# Patient Record
Sex: Male | Born: 1975 | Race: White | Hispanic: No | Marital: Married | Smoking: Never smoker
Health system: Southern US, Community
[De-identification: ages and names within clinical notes are randomized; demographics above are authoritative.]

## PROBLEM LIST (undated history)

## (undated) ENCOUNTER — Emergency Department (HOSPITAL_COMMUNITY): Discharge status: Absent without leave | Payer: 59

## (undated) DIAGNOSIS — C73 Malignant neoplasm of thyroid gland: Secondary | ICD-10-CM

## (undated) DIAGNOSIS — N200 Calculus of kidney: Secondary | ICD-10-CM

## (undated) DIAGNOSIS — Z923 Personal history of irradiation: Secondary | ICD-10-CM

## (undated) DIAGNOSIS — I10 Essential (primary) hypertension: Secondary | ICD-10-CM

## (undated) DIAGNOSIS — A799 Rickettsiosis, unspecified: Secondary | ICD-10-CM

## (undated) HISTORY — DX: Personal history of irradiation: Z92.3

## (undated) HISTORY — PX: CHOLECYSTECTOMY: SHX55

## (undated) HISTORY — DX: Malignant neoplasm of thyroid gland: C73

---
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
## 2001-01-07 ENCOUNTER — Emergency Department (HOSPITAL_COMMUNITY): Admission: EM | Admit: 2001-01-07 | Discharge: 2001-01-07 | Payer: Self-pay | Admitting: Emergency Medicine

## 2003-01-24 ENCOUNTER — Emergency Department (HOSPITAL_COMMUNITY): Admission: AD | Admit: 2003-01-24 | Discharge: 2003-01-24 | Payer: Self-pay

## 2004-01-09 ENCOUNTER — Emergency Department (HOSPITAL_COMMUNITY): Admission: EM | Admit: 2004-01-09 | Discharge: 2004-01-09 | Payer: Self-pay | Admitting: Family Medicine

## 2005-10-16 ENCOUNTER — Ambulatory Visit: Payer: Self-pay | Admitting: Sports Medicine

## 2005-10-16 ENCOUNTER — Encounter: Admission: RE | Admit: 2005-10-16 | Discharge: 2005-10-16 | Payer: Self-pay | Admitting: Sports Medicine

## 2006-06-07 ENCOUNTER — Emergency Department (HOSPITAL_COMMUNITY): Admission: EM | Admit: 2006-06-07 | Discharge: 2006-06-07 | Payer: Self-pay | Admitting: Emergency Medicine

## 2007-06-12 ENCOUNTER — Emergency Department (HOSPITAL_COMMUNITY): Admission: EM | Admit: 2007-06-12 | Discharge: 2007-06-12 | Payer: Self-pay | Admitting: Family Medicine

## 2007-09-14 ENCOUNTER — Emergency Department (HOSPITAL_COMMUNITY): Admission: EM | Admit: 2007-09-14 | Discharge: 2007-09-14 | Payer: Self-pay | Admitting: Emergency Medicine

## 2008-01-17 ENCOUNTER — Emergency Department (HOSPITAL_COMMUNITY): Admission: EM | Admit: 2008-01-17 | Discharge: 2008-01-17 | Payer: Self-pay | Admitting: Family Medicine

## 2008-09-05 ENCOUNTER — Emergency Department (HOSPITAL_COMMUNITY): Admission: EM | Admit: 2008-09-05 | Discharge: 2008-09-05 | Payer: Self-pay | Admitting: Family Medicine

## 2008-10-07 ENCOUNTER — Emergency Department (HOSPITAL_COMMUNITY): Admission: EM | Admit: 2008-10-07 | Discharge: 2008-10-07 | Payer: Self-pay | Admitting: Family Medicine

## 2008-10-08 ENCOUNTER — Observation Stay (HOSPITAL_COMMUNITY): Admission: EM | Admit: 2008-10-08 | Discharge: 2008-10-11 | Payer: Self-pay | Admitting: Emergency Medicine

## 2008-10-10 ENCOUNTER — Encounter (INDEPENDENT_AMBULATORY_CARE_PROVIDER_SITE_OTHER): Payer: Self-pay | Admitting: General Surgery

## 2009-06-03 DIAGNOSIS — J309 Allergic rhinitis, unspecified: Secondary | ICD-10-CM | POA: Insufficient documentation

## 2010-05-24 LAB — CBC
HCT: 41.5 % (ref 39.0–52.0)
HCT: 48.8 % (ref 39.0–52.0)
Hemoglobin: 14.6 g/dL (ref 13.0–17.0)
Hemoglobin: 16.8 g/dL (ref 13.0–17.0)
MCHC: 34.4 g/dL (ref 30.0–36.0)
MCHC: 35.1 g/dL (ref 30.0–36.0)
MCV: 89.3 fL (ref 78.0–100.0)
MCV: 90.2 fL (ref 78.0–100.0)
Platelets: 163 10*3/uL (ref 150–400)
Platelets: 178 10*3/uL (ref 150–400)
Platelets: 183 10*3/uL (ref 150–400)
RBC: 4.35 MIL/uL (ref 4.22–5.81)
RBC: 4.64 MIL/uL (ref 4.22–5.81)
RBC: 5.41 MIL/uL (ref 4.22–5.81)
RDW: 12.9 % (ref 11.5–15.5)
RDW: 13.5 % (ref 11.5–15.5)
WBC: 5.3 10*3/uL (ref 4.0–10.5)
WBC: 5.3 10*3/uL (ref 4.0–10.5)
WBC: 7.3 10*3/uL (ref 4.0–10.5)

## 2010-05-24 LAB — COMPREHENSIVE METABOLIC PANEL
ALT: 20 U/L (ref 0–53)
ALT: 23 U/L (ref 0–53)
AST: 18 U/L (ref 0–37)
AST: 24 U/L (ref 0–37)
Albumin: 3.6 g/dL (ref 3.5–5.2)
Albumin: 4.5 g/dL (ref 3.5–5.2)
Alkaline Phosphatase: 81 U/L (ref 39–117)
Alkaline Phosphatase: 92 U/L (ref 39–117)
BUN: 7 mg/dL (ref 6–23)
BUN: 9 mg/dL (ref 6–23)
CO2: 25 mEq/L (ref 19–32)
CO2: 26 mEq/L (ref 19–32)
Calcium: 8.2 mg/dL — ABNORMAL LOW (ref 8.4–10.5)
Calcium: 9.2 mg/dL (ref 8.4–10.5)
Chloride: 103 mEq/L (ref 96–112)
Chloride: 104 mEq/L (ref 96–112)
Creatinine, Ser: 0.75 mg/dL (ref 0.4–1.5)
Creatinine, Ser: 0.79 mg/dL (ref 0.4–1.5)
GFR calc Af Amer: >60 mL/min (ref >60)
GFR calc Af Amer: >60 mL/min (ref >60)
GFR calc non Af Amer: >60 mL/min (ref >60)
GFR calc non Af Amer: >60 mL/min (ref >60)
Glucose, Bld: 94 mg/dL (ref 70–99)
Glucose, Bld: 97 mg/dL (ref 70–99)
Potassium: 3.5 mEq/L (ref 3.5–5.1)
Potassium: 3.8 mEq/L (ref 3.5–5.1)
Sodium: 136 mEq/L (ref 135–145)
Sodium: 136 mEq/L (ref 135–145)
Total Bilirubin: 1.4 mg/dL — ABNORMAL HIGH (ref 0.3–1.2)
Total Bilirubin: 1.4 mg/dL — ABNORMAL HIGH (ref 0.3–1.2)
Total Protein: 6.1 g/dL (ref 6.0–8.3)
Total Protein: 7.8 g/dL (ref 6.0–8.3)

## 2010-05-24 LAB — DIFFERENTIAL
Basophils Absolute: 0 10*3/uL (ref 0.0–0.1)
Basophils Absolute: 0 10*3/uL (ref 0.0–0.1)
Basophils Relative: 0 % (ref 0–1)
Basophils Relative: 0 % (ref 0–1)
Eosinophils Absolute: 0 10*3/uL (ref 0.0–0.7)
Eosinophils Absolute: 0 10*3/uL (ref 0.0–0.7)
Eosinophils Relative: 0 % (ref 0–5)
Eosinophils Relative: 0 % (ref 0–5)
Lymphocytes Relative: 13 % (ref 12–46)
Lymphocytes Relative: 7 % — ABNORMAL LOW (ref 12–46)
Lymphs Abs: 0.5 10*3/uL — ABNORMAL LOW (ref 0.7–4.0)
Lymphs Abs: 0.7 10*3/uL (ref 0.7–4.0)
Monocytes Absolute: 0.4 10*3/uL (ref 0.1–1.0)
Monocytes Absolute: 0.5 10*3/uL (ref 0.1–1.0)
Monocytes Relative: 7 % (ref 3–12)
Monocytes Relative: 8 % (ref 3–12)
Neutro Abs: 4.2 10*3/uL (ref 1.7–7.7)
Neutro Abs: 6.3 10*3/uL (ref 1.7–7.7)
Neutrophils Relative %: 79 % — ABNORMAL HIGH (ref 43–77)
Neutrophils Relative %: 86 % — ABNORMAL HIGH (ref 43–77)

## 2010-05-24 LAB — LIPASE, BLOOD: Lipase: 25 U/L (ref 11–59)

## 2010-05-24 LAB — T4, FREE: Free T4: 0.94 ng/dL (ref 0.80–1.80)

## 2010-05-24 LAB — BASIC METABOLIC PANEL
BUN: 3 mg/dL — ABNORMAL LOW (ref 6–23)
Calcium: 8.5 mg/dL (ref 8.4–10.5)
Creatinine, Ser: 0.87 mg/dL (ref 0.4–1.5)
GFR calc Af Amer: >60 mL/min (ref >60)
GFR calc non Af Amer: >60 mL/min (ref >60)

## 2010-05-24 LAB — URINALYSIS, ROUTINE W REFLEX MICROSCOPIC
Bilirubin Urine: NEGATIVE
Glucose, UA: NEGATIVE mg/dL
Hgb urine dipstick: NEGATIVE
Ketones, ur: NEGATIVE mg/dL
Nitrite: NEGATIVE
Protein, ur: NEGATIVE mg/dL
Specific Gravity, Urine: 1.024 (ref 1.005–1.030)
Urobilinogen, UA: 1 mg/dL (ref 0.0–1.0)
pH: 6.5 (ref 5.0–8.0)

## 2010-05-24 LAB — AMYLASE
Amylase: 109 U/L (ref 27–131)
Amylase: 94 U/L (ref 27–131)

## 2010-05-24 LAB — URINE CULTURE: Colony Count: 25000

## 2010-05-24 LAB — LIPID PANEL
Cholesterol: 119 mg/dL (ref 0–200)
HDL: 20 mg/dL — ABNORMAL LOW (ref >39)
LDL Cholesterol: 82 mg/dL (ref 0–99)
Total CHOL/HDL Ratio: 6 RATIO
Triglycerides: 84 mg/dL (ref <150)
VLDL: 17 mg/dL (ref 0–40)

## 2010-05-24 LAB — TSH: TSH: 1.193 u[IU]/mL (ref 0.350–4.500)

## 2010-05-24 LAB — BILIRUBIN, DIRECT: Bilirubin, Direct: 0.2 mg/dL (ref 0.0–0.3)

## 2010-05-24 LAB — LACTIC ACID, PLASMA: Lactic Acid, Venous: 1.3 mmol/L (ref 0.5–2.2)

## 2010-05-24 LAB — SEDIMENTATION RATE: Sed Rate: 2 mm/hr (ref 0–16)

## 2010-07-01 NOTE — H&P (Signed)
NAME:  Brian [name], Brian [name] NO.:  [id number]   MEDICAL RECORD NO.:  [id number]          PATIENT TYPE:  EMS   LOCATION:  MAJO                         FACILITY:  [facility]   PHYSICIAN:  Vania Rea, M.D. DATE OF BIRTH:  07/15/1975   DATE OF ADMISSION:  10/07/2008  DATE OF DISCHARGE:                              HISTORY & PHYSICAL   PRIMARY CARE PHYSICIAN:  Dr. Koren Bound in [place], [place].   CHIEF COMPLAINT:  Abdominal pain since the morning of August 22.   HISTORY OF PRESENT ILLNESS:  This is a 35 year old obese Caucasian  gentleman who works as an Museum/gallery exhibitions officer at the [employer] and has been  in good health until he was awoken at about 4:30 a.m. this morning with  sharp, cramping right sided abdominal pain which he described as a 10  out of 10.  The pain did not radiate and seemed to maintain a constant  high but would wax and wane intermittently at about 15 minutes  intervals.  But was never below and 8/10 until eventually presented to  [facility] yesterday and was treated with intravenous  narcotics.  The patient gives a past history of right-sided abdominal  discomfort with fatty meals and was presumed to have had cholecystitis,  but subsequent laboratory investigations, ultrasounds and CAT scans have  been negative.  The patient continues to complain of severe abdominal  pain.  The pain is associated with nausea and vomiting if he tries to  drink anything, but no diarrhea.  No dysuria.  He did have a temperature  of 100.7 by report at the [facility] but is currently  afebrile.  He has no history of peptic ulcer disease.  He has had no  melena nor bloody stool.   PAST MEDICAL HISTORY:  Childhood asthma, past history of bronchitis,  seasonal allergies.   MEDICATIONS:  None.   ALLERGIES:  NO KNOWN DRUG ALLERGIES.   SOCIAL HISTORY:  Denies any history of alcohol, tobacco or illicit drug  use.  Works as an Museum/gallery exhibitions officer as noted above.   Lives with his wife and his 59-  year-old and 70-month-old children.   FAMILY HISTORY:  Significant for mother with diabetes type 1,  hypertension and history of myocardial infarction.   SURGICAL HISTORY:  Has never had surgery.   REVIEW OF SYSTEMS:  Other than noted above, 10-point review of systems  was unremarkable.   PHYSICAL EXAM:  GENERAL:  Obese young Caucasian gentleman lying in the  stretcher drowsy from narcotic pain medications, yet still seems to be  in some discomfort.  VITALS:  His temperature is 98.9, pulse 89, respirations 20, blood  pressure 135/91.  He is saturating 100% on 3 liters.  HEENT:  Pupils are round equal and reactive.  Mucous membranes pink.  Anicteric.  He is mildly dehydrated.  No cervical lymphadenopathy or  thyromegaly.  No jugular venous distention.  No carotid bruit.  No oral  Candida or pharyngeal injection.  CHEST:  Clear to auscultation bilaterally.  CARDIOVASCULAR:  Regular rhythm without murmurs.  ABDOMEN:  Obese and soft.  There are no masses.  There is no epigastric  tenderness but he does have some tenderness in the right upper quadrant  but no masses felt and the area is soft to palpation.  There is no  rebound.  He has normal abdominal bowel sounds.  EXTREMITIES:  Without edema, has 3+ bounding pulses bilaterally.  His  skin is warm and dry.  There is no ulcerations.  MUSCULOSKELETAL:  He has no bony joint deformity and he has good muscle  tone and bulk.   LABORATORY DATA:  His white count is 5.3, hemoglobin 14.6, platelets  163.  He has 79% neutrophils.  Absolute granulocyte count is 4.2.  Differential is otherwise unremarkable.  Sodium is 136, potassium 3.5,  chloride 104, CO2 26, BUN 7, creatinine 0.8, glucose is 97.  Liver  function tests significant only for total bilirubin of 1.4, alk phos,  AST and ALT are all normal.  Total protein is borderline low at 6.1,  albumin is 3.16.  His calcium is 8.2.  Amylase is normal at 94.  His   lipase is normal 25.  Urinalysis is completely normal and his venous  lactic acid is also completely normal at 1.3.   IMAGING STUDIES:  Ultrasound of the abdomen reveals normal caliber  common bile duct at 2.4 mm.  Liver is normal echogenicity.  The IVC is  normal.  The right kidney is normal without focal lesions or  hydronephrosis.  The gallbladder is sonographically normal.  Unremarkable ultrasound.  CT scan of the abdomen and pelvis again showed  mild soft tissue stranding in the mesenteric fat which is nonspecific  but may reflect mesenteric edema, inflammation or fibrosis, no  associated adenopathy or bowel abnormality.  Normal gallbladder.  No  acute pelvic findings in the pelvis.  No fluid collection.   ASSESSMENT:  Acute right upper abdominal pain in obese gentleman with a  history of fatty intolerance but with negative imaging studies to  suggest gallbladder disease and mildly elevated total bilirubin.   PLAN:  1. Will get a differential on his bilirubin, will admit him for IV      fluid hydration and pain management and get a HIDA scan in the      morning.  If HIDA scan is positive we will refer to surgery for      possible cholecystectomy.  2. The patient does have evidence of possible mesenteric edema but it      is unclear the significance.  Other plans as per orders.      Vania Rea, M.D.  Electronically Signed     LC/MEDQ  D:  10/08/2008  T:  10/08/2008  Job:  [id number]   cc:   Dr. Koren Bound, [place]

## 2010-07-01 NOTE — Consult Note (Signed)
NAME:  Brian [name], Brian [name] NO.:  [id number]   MEDICAL RECORD NO.:  [id number]          PATIENT TYPE:  OBV   LOCATION:  5156                         FACILITY:  [facility]   PHYSICIAN:  Gabrielle Dare. Janee Morn, M.D.DATE OF BIRTH:  01-17-1976   DATE OF CONSULTATION:  10/09/2008  DATE OF DISCHARGE:                                 CONSULTATION   PRIMARY CARE PHYSICIAN:  Koren Bound, MD, in [place], [place].   REQUESTING PHYSICIAN:  Michelene Gardener, MD   CONSULTING SURGEON:  Gabrielle Dare. Janee Morn, MD   REASON FOR CONSULTATION:  Biliary dyskinesia.   HISTORY OF PRESENT ILLNESS:  Brian [name] is a 35 year old white male with  no significant past medical history who awoke from sleep at 4:30 a.m. on  Sunday morning with a sharp stabby right upper quadrant abdominal pain.  Later that day, he went to the urgent care where subsequently had an  ultrasound of the abdomen that was performed, which showed a completely  normal gallbladder and no gallstones present.  He then had a CT scan of  the abdomen and pelvis completed, which once again revealed a normal-  appearing gallbladder, which may be a slight mild stranding around the  mesentery, but otherwise normal.  He was then sent to the emergency  department where he was admitted for observation.  He subsequently then  developed some nausea, however, this was controlled with Compazine.  A  HIDA scan was then performed, which showed a patent cystic and common  bile duct, however, he had biliary dyskinesia with an ejection fraction  of 9.4%.  Currently, he is still having mild right upper quadrant pain  that did tolerate a regular bland diet this morning.  Due to the  findings of biliary dyskinesia, we were asked to evaluate the patient.   REVIEW OF SYSTEMS:  Please see HPI.  Otherwise, all other systems are  negative.   PAST MEDICAL HISTORY:  Seasonal allergies.   PAST SURGICAL HISTORY:  None.   SOCIAL HISTORY:  The patient is married and  has 2 children.  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   ALLERGIES:  NKDA.   MEDICATIONS:  None.   PHYSICAL EXAMINATION:  GENERAL:  The patient is 35 year old white male,  who is currently walking around and sitting up, in no acute distress.  VITAL SIGNS:  Temperature 97.7, pulse 54, respirations 18, and blood  pressure 98/55.  HEENT:  Head is normocephalic and atraumatic.  Sclerae noninjected.  Pupils are equal, round, and reactive to light.  Ears and nose without  any obvious masses or lesions.  No rhinorrhea.  Mouth is pink and moist.  Throat shows no exudate.  HEART:  Regular rate and rhythm.  Normal S1 and S2.  No murmurs,  gallops, or rubs were noted.  LUNGS:  Clear to auscultation bilaterally with no wheezes, rhonchi, or  rales noted.  Respiratory effort is nonlabored.  ABDOMEN:  Soft with very mild right upper quadrant tenderness.  Otherwise, he does  have active bowel sounds and is nondistended.  He  does not have any masses, hernias, or scars noted.  MUSCULOSKELETAL:  All 4 extremities are symmetrical.  No cyanosis,  clubbing, or edema.  PSYCH:  The patient is alert and oriented x3 with an appropriate affect.   LABORATORY DATA AND DIAGNOSTIC DATA:  White blood cell count 5300,  hemoglobin 14.6, hematocrit 41.5, and platelet count is 163,000.  Sodium  136, potassium 3.5, glucose 97, BUN 7, and creatinine 0.79.  AST 18, ALT  20, alkaline phosphatase 81, total bilirubin 1.4, however, direct  bilirubin is only 0.2.  Diagnostics, ultrasound of the abdomen reveals a  normal gallbladder with no cholelithiasis and normal caliber of common  bile duct.  CT scan of the abdomen and pelvis revealed mild soft tissue  stranding in the mesenteric fat, which is nonspecific.  Otherwise, the  gallbladder appears normal and the rest of the scan is normal.  HIDA  scan, the patient has a patent cystic to common bile duct with an   ejection fraction of 9.4%.   IMPRESSION:  Biliary dyskinesia.   PLAN:  Biliary dyskinesia is a non-urgent/emergent problem.  The  patient's white blood cell count, LFTs, and diagnostic studies are  essentially normal except for currently the patient is still having some  mild right upper quadrant abdominal pain.  Most patients consider to go  home and come back to our office and have an elective laparoscopic  cholecystectomy with no problems.  However, due to the patient still  having some mild pain and not wanting to go home  and have a repeat  episode, went home along with his children and living an hour away, the  patient would rather stay and have his gallbladder out this admission.  I have explained to the patient that we have several urgent/emergent  cases that are already scheduled for the next several days and it may be  2-3 days before we are able to get to his laparoscopic cholecystectomy  due to his nonurgent state.  Despite this, he still is leaning more  towards same due to his concern of going home and having a repeat  episode.  Because of this problem, I will have Dr. Janee Morn talk with  the patient to discuss a further plan.      Letha Cape, PA      Gabrielle Dare. Janee Morn, M.D.  Electronically Signed    KEO/MEDQ  D:  10/09/2008  T:  10/10/2008  Job:  [id number]   cc:   Michelene Gardener, MD  Koren Bound, MD

## 2010-07-01 NOTE — Discharge Summary (Signed)
NAME:  Brian [name], Brian [name] NO.:  [id number]   MEDICAL RECORD NO.:  [id number]          PATIENT TYPE:  OBV   LOCATION:  5156                         FACILITY:  [facility]   PHYSICIAN:  Michelene Gardener, MD    DATE OF BIRTH:  [birth year]   DATE OF ADMISSION:  10/07/2008  DATE OF DISCHARGE:  10/09/2008                               DISCHARGE SUMMARY   DISCHARGE DIAGNOSES:  1. Dysfunctional gallbladder  2. Abdominal pain.  3. Nausea and vomiting.  4. Obesity.   DISCHARGE MEDICATION:  Phenergan 25 mg p.o. q.4 h. as needed.   CONSULTATIONS:  None.   PROCEDURES:  None.   DIAGNOSTIC STUDIES:  1. Abdominal ultrasound on October 07, 2008 showed no acute findings.  2. CT scan of the abdomen without contrast in October 07, 2008 showed      nonspecific findings, gallbladder appeared normal.  3. CT scan of the pelvis showed no acute problem.  4. HIDA scan with ejection fraction showed no evidence of common bowel      obstruction and markedly diminished ejection fraction at 9.4.   COURSE OF HOSPITALIZATION:  This is a 35 year old Caucasian male  presented to the hospital with increasing nausea, vomiting, and  abdominal pain.  He has abdominal ultrasound that came to be normal.  CT  scan of the abdomen and pelvis were normal.  HIDA scan was done and it  showed low ejection fraction, but no evidence of gallstone.  This  patient possibly will need his gallbladder to come out and that could be  done electively.  Condition was discussed with him and he preferred to  follow this as an outpatient.  Currently, he is asymptomatic and he is  stable and it is felt that it is fine to get him home.   Addendum: the condition was re-discussed with the patient who changed  his mind and preferred to stay until evaluated by general surgery for  possible choleycystectomy. Addendum will be added by the discharging  physician.   Total assessment time is 40 minutes.      Michelene Gardener, MD  Electronically Signed    NAE/MEDQ  D:  10/09/2008  T:  10/10/2008  Job:  [id number]

## 2010-07-01 NOTE — Op Note (Signed)
NAME:  Brian [name], Brian [name] NO.:  [id number]   MEDICAL RECORD NO.:  [id number]          PATIENT TYPE:  OBV   LOCATION:  5156                         FACILITY:  [facility]   PHYSICIAN:  Gabrielle Dare. Janee Morn, M.D.DATE OF BIRTH:  07-21-1975   DATE OF PROCEDURE:  10/10/2008  DATE OF DISCHARGE:                               OPERATIVE REPORT   PREOPERATIVE DIAGNOSIS:  Biliary dyskinesia.   POSTOPERATIVE DIAGNOSIS:  Biliary dyskinesia.   PROCEDURE:  Laparoscopic cholecystectomy.   SURGEON:  Gabrielle Dare. Janee Morn, MD   ASSISTANT:  Brayton El, PA-C   ANESTHESIA:  General endotracheal.   HISTORY OF PRESENT ILLNESS:  [name] is a 35 year old white male who  has had episodic right upper quadrant pain.  Workup included HIDA scan  demonstrating gallbladder ejection fraction of 9.4%, so he is brought  for cholecystectomy.   PROCEDURE IN DETAIL:  Informed consent was obtained.  The patient  received intravenous antibiotics.  He was identified in the preop  holding area.  He was brought to the operating room.  General  endotracheal anesthesia was administered by anesthesia staff.  His  abdomen was prepped and draped in sterile fashion.  We performed a time-  out procedure.  The infraumbilical region was infiltrated with 0.25%  Marcaine with epinephrine.  An infraumbilical incision was made.  Subcutaneous tissues were dissected down revealing the anterior fascia.  This was divided sharply and the peritoneal cavity was entered under  direct vision without difficulty.  A 0-Vicryl pursestring suture was  placed around the fascial opening.  A Hasson trocar was inserted into  the abdomen.  The abdomen was insufflated with carbon dioxide in  standard fashion.  Under direct vision, an 11-mm epigastric and two 5-mm  lateral ports were placed.  A 0.25% Marcaine with epinephrine was used  at all port sites.  The dome of the gallbladder was then retracted  superomedially.  The infundibulum was  retracted inferolaterally.  The  gallbladder appeared somewhat edematous.  Dissection began laterally and  progressed medially, easily identifying the cystic duct and the cystic  artery.  Dissection was continued until a large window was created  between the cystic duct, the infundibulum of the gallbladder and liver.  Once we had excellent visualization, a clip was placed on the  infundibulum-cystic duct junction.  A small nick was made in the cystic  duct and a Reddick cholangiogram catheter was inserted.  Intraoperative  cholangiogram was attempted, however, the Reddick catheter could not be  fully inserted into the cystic duct due to some bowel.  We did try and  freed up these bowels but we were unable to insert the Reddick catheter.  Due to the patient's normal liver function tests, cholangiogram was then  abandoned.  The cystic duct was clipped 3 times proximally and then  divided.  Clips were in excellent position.  The cystic artery was then  further dissected out.  It was clipped 3 times proximally, once  distally, and divided.  The gallbladder was then taken off the liver bed  with Bovie cautery.  We got  excellent hemostasis along the way.  The  gallbladder was placed in an EndoCatch bag and removed from the abdomen  via the infraumbilical port site.  The abdomen was copiously irrigated  with saline.  The irrigation fluid returned clear.  The liver bed was  rechecked.  The liver bed remained completely dry.  The clips remained  in excellent position.  Some residual irrigation of fluid was evacuated.  The ports were then removed under direct vision.  The pneumoperitoneum  was released.  The infraumbilical fascia was closed by tying the 0-  Vicryl pursestring suture.  All 4 wounds were copiously irrigated and  skin of each was closed with running 4-0 Vicryl subcuticular stitch  followed by Dermabond.  Sponge, needle, and instrument counts were all  correct.  The patient tolerated  the procedure well without apparent  complication and was taken to the recovery room in stable condition.      Gabrielle Dare Janee Morn, M.D.  Electronically Signed     BET/MEDQ  D:  10/10/2008  T:  10/11/2008  Job:  [id number]   cc:   Vania Rea, M.D.

## 2010-11-14 LAB — POCT URINALYSIS DIP (DEVICE)
Glucose, UA: NEGATIVE
Operator id: 208841
Specific Gravity, Urine: 1.025
Urobilinogen, UA: 1

## 2010-12-30 ENCOUNTER — Encounter: Payer: Self-pay | Admitting: *Deleted

## 2010-12-30 ENCOUNTER — Emergency Department
Admission: EM | Admit: 2010-12-30 | Discharge: 2010-12-30 | Disposition: A | Discharge status: Home / Self Care | Payer: 59 | Source: Home / Self Care | Attending: Emergency Medicine | Admitting: Emergency Medicine

## 2010-12-30 ENCOUNTER — Emergency Department
Admit: 2010-12-30 | Discharge: 2010-12-30 | Disposition: A | Discharge status: Home / Self Care | Payer: 59 | Attending: Emergency Medicine | Admitting: Emergency Medicine

## 2010-12-30 DIAGNOSIS — M79609 Pain in unspecified limb: Secondary | ICD-10-CM

## 2010-12-30 DIAGNOSIS — S92919A Unspecified fracture of unspecified toe(s), initial encounter for closed fracture: Secondary | ICD-10-CM

## 2010-12-30 DIAGNOSIS — M79676 Pain in unspecified toe(s): Secondary | ICD-10-CM

## 2010-12-30 NOTE — ED Notes (Signed)
Patient tripped @ home yesterday injuring his right 4th toe. He pulled the toe back in to place then buddy taped it to the 3rd toe. He has ecchymosis to the 4th toe and pain @ the metatarsal.

## 2010-12-30 NOTE — ED Provider Notes (Signed)
History     CSN: [id number] Arrival date & time: 12/30/2010  2:43 PM   First MD Initiated Contact with Patient 12/30/10 1443      Chief Complaint  Patient presents with  . 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  He felt that the 4th toe (maybe 5th toe) dislocated, so he relocated him himself using traction.  Today is feeling more pain in his 3rd-5th toes, esp 4th with associated bruising.  It is painful to walk on it so he's been walking on a supinated foot which helps.  History reviewed. No pertinent past medical history.  History reviewed. No pertinent past surgical history.  No family history on file.  History  Substance Use Topics  . Smoking status: Not on file  . Smokeless tobacco: Not on file  . Alcohol Use: Not on file      Review of Systems  Allergies  Review of patient's allergies indicates no known allergies.  Home Medications  No current outpatient prescriptions on file.  BP 124/88  Pulse 84  Temp(Src) 98.5 F (36.9 C) (Oral)  Resp 14  Ht 5\' 11"  (1.803 m)  Wt 247 lb (112.038 kg)  BMI 34.45 kg/m2  SpO2 100%  Physical Exam  Nursing note and vitals reviewed. Constitutional: He is oriented to person, place, and time. He appears well-developed and well-nourished.  HENT:  Head: Normocephalic and atraumatic.  Neck: Neck supple.  Cardiovascular: Regular rhythm and normal heart sounds.   Pulmonary/Chest: Effort normal and breath sounds normal. No respiratory distress.  Musculoskeletal:       R ankle/foot: FROM, +TTP 4th toe and distal 4th metatarsal with associated bruising and mild swelling.   No TTP medial/lateral malleolus, navicular, base of 5th, calcaneus, Achilles, proximal fibula.  Distal neurovascular status is intact.  There is no gross deformity or rotation of the toe.    Neurological: He is alert and oriented to person, place, and time.  Skin: Skin is warm and dry.  Psychiatric: He has a normal mood and affect. His speech is normal.    ED Course  Procedures (including critical care time)  Labs Reviewed - No data to display No results found.   No diagnosis found.    MDM   Xray obtained and read by radiology as "dorsal plate avulsion fracture of distal phalynx".  Encourage rest, ice, compression with ACE bandage / buddy taping / firm-soled shoe, and elevation of injured body part.   He is an EMT and understands the treatment.  If not improving, to follow up with Dr. Pearletha Forge at [facility] sports medicine.      Lily Kocher, MD 12/30/10 [phone]

## 2011-05-08 ENCOUNTER — Ambulatory Visit
Admission: RE | Admit: 2011-05-08 | Discharge: 2011-05-08 | Disposition: A | Discharge status: Home / Self Care | Payer: No Typology Code available for payment source | Source: Ambulatory Visit | Attending: Family Medicine | Admitting: Family Medicine

## 2011-05-08 ENCOUNTER — Other Ambulatory Visit: Payer: Self-pay | Admitting: Family Medicine

## 2011-05-08 DIAGNOSIS — R52 Pain, unspecified: Secondary | ICD-10-CM

## 2012-05-11 ENCOUNTER — Encounter: Payer: Self-pay | Admitting: Family Medicine

## 2012-05-11 ENCOUNTER — Ambulatory Visit (INDEPENDENT_AMBULATORY_CARE_PROVIDER_SITE_OTHER): Payer: 59 | Admitting: Family Medicine

## 2012-05-11 VITALS — BP 125/78 | HR 96 | Ht 71.0 in | Wt 245.0 lb

## 2012-05-11 DIAGNOSIS — M79604 Pain in right leg: Secondary | ICD-10-CM

## 2012-05-11 DIAGNOSIS — M545 Low back pain, unspecified: Secondary | ICD-10-CM

## 2012-05-11 MED ORDER — CYCLOBENZAPRINE HCL 5 MG PO TABS: 5.0000 mg | ORAL_TABLET | Freq: Three times a day (TID) | ORAL | Status: DC | PRN

## 2012-05-11 MED ORDER — PREDNISONE 10 MG PO TABS: ORAL_TABLET | ORAL | Status: DC

## 2012-05-11 NOTE — Patient Instructions (Addendum)
You have lumbar radiculopathy (a pinched nerve in your low back). Prednisone dose pack x 6 days. Day after prednisone is finished you can take 2 aleve twice a day with food for pain and inflammation. Flexeril as needed for muscle spasms (no driving on this medicine if it makes you sleepy). Stay as active as possible. Physical therapy has been shown to be helpful as well. Do home exercises on days you don't go to therapy. Strengthening of low back muscles, abdominal musculature are key for long term pain relief. If not improving, will consider further imaging (MRI). Get x-rays at your convenience and we'll call you with the results.

## 2012-05-12 ENCOUNTER — Encounter: Payer: Self-pay | Admitting: Family Medicine

## 2012-05-12 DIAGNOSIS — M545 Low back pain, unspecified: Secondary | ICD-10-CM | POA: Insufficient documentation

## 2012-05-12 NOTE — Assessment & Plan Note (Signed)
consistent with mild radiculopathy into right leg.  Start with prednisone dose pack, transition to aleve.  Flexeril as needed for spasms.  Start formal PT and home exercises.  Will get x-rays tomorrow as well and we'll call him with the results.  F/u in about 6 weeks - if not improving consider MRI.

## 2012-05-12 NOTE — Progress Notes (Addendum)
  Subjective:    Patient ID: Brian [name], male    DOB: [birth year], 37 y.o.   MRN: [id number]  PCP: Dr. Marquette Old  HPI 37 yo M here for back pain.  Patient reports he's had problems with his back in the past but nothing significant to warrant evaluation. Then over past week started to get worse low back pain with radiation into right leg. At one point had a catch in his low back when trying to come up from bending over - almost fell down as a result. No swelling, bruising, trauma. Tried ibuprofen, aleve, heating pad. Some numbness into right leg to toes. No bowel/bladder dysfunction.  History reviewed. No pertinent past medical history.  No current outpatient prescriptions on file prior to visit.   No current facility-administered medications on file prior to visit.    Past Surgical History  Procedure Laterality Date  . Cholecystectomy      No Known Allergies  History   Social History  . Marital Status: Married    Spouse Name: N/A    Number of Children: N/A  . Years of Education: N/A   Occupational History  . Not on file.   Social History Main Topics  . Smoking status: Never Smoker   . Smokeless tobacco: Not on file  . Alcohol Use: No  . Drug Use: No  . Sexually Active: Not on file   Other Topics Concern  . Not on file   Social History Narrative  . No narrative on file    Family History  Problem Relation Age of Onset  . Diabetes Mother   . Heart disease Mother   . Heart failure Mother   . Heart attack Mother   . Hyperlipidemia Mother   . Hypertension Mother   . Hyperlipidemia Father   . Sudden death Neg Hx     BP 125/78  Pulse 96  Ht 5\' 11"  (1.803 m)  Wt 245 lb (111.131 kg)  BMI 34.19 kg/m2  Review of Systems See HPI above.    Objective:   Physical Exam Gen: NAD  Back: No gross deformity, scoliosis. TTP mid low back and R > L paraspinal regions.  No focal bony TTP. FROM with pain on flexion and extension but equal. Strength LEs 5/5 all  muscle groups.   2+ MSRs in patellar and achilles tendons, equal bilaterally. Mild positive right SLR. Sensation intact to light touch bilaterally. Negative logroll bilateral hips Negative fabers and piriformis stretches.  Left faber with less motion but not painful at SI joint.    Assessment & Plan:  1. Low back pain - consistent with mild radiculopathy into right leg.  Start with prednisone dose pack, transition to aleve.  Flexeril as needed for spasms.  Start formal PT and home exercises.  Will get x-rays tomorrow as well and we'll call him with the results.  F/u in about 6 weeks - if not improving consider MRI.  Addendum 4/3:  Patient's x-rays were normal without degenerative changes.  Patient notified.

## 2012-05-18 ENCOUNTER — Ambulatory Visit (HOSPITAL_BASED_OUTPATIENT_CLINIC_OR_DEPARTMENT_OTHER)
Admission: RE | Admit: 2012-05-18 | Discharge: 2012-05-18 | Disposition: A | Discharge status: Home / Self Care | Payer: 59 | Source: Ambulatory Visit | Attending: Family Medicine | Admitting: Family Medicine

## 2012-05-18 DIAGNOSIS — M79604 Pain in right leg: Secondary | ICD-10-CM

## 2012-05-18 DIAGNOSIS — M545 Low back pain, unspecified: Secondary | ICD-10-CM

## 2012-05-18 DIAGNOSIS — IMO0002 Reserved for concepts with insufficient information to code with codable children: Secondary | ICD-10-CM | POA: Insufficient documentation

## 2012-05-18 DIAGNOSIS — X58XXXA Exposure to other specified factors, initial encounter: Secondary | ICD-10-CM | POA: Insufficient documentation

## 2012-05-19 ENCOUNTER — Ambulatory Visit: Payer: 59 | Attending: Family Medicine | Admitting: Rehabilitation

## 2012-05-19 DIAGNOSIS — IMO0001 Reserved for inherently not codable concepts without codable children: Secondary | ICD-10-CM | POA: Insufficient documentation

## 2012-05-19 DIAGNOSIS — M545 Low back pain, unspecified: Secondary | ICD-10-CM | POA: Insufficient documentation

## 2012-05-25 ENCOUNTER — Ambulatory Visit: Payer: 59 | Admitting: Rehabilitation

## 2012-06-01 ENCOUNTER — Encounter: Payer: 59 | Admitting: Rehabilitation

## 2012-06-02 ENCOUNTER — Encounter: Payer: 59 | Admitting: Rehabilitation

## 2012-06-08 ENCOUNTER — Ambulatory Visit: Payer: 59 | Admitting: Rehabilitation

## 2012-06-09 ENCOUNTER — Ambulatory Visit: Payer: 59 | Admitting: Rehabilitation

## 2012-06-15 ENCOUNTER — Ambulatory Visit: Payer: 59 | Admitting: Family Medicine

## 2012-06-15 ENCOUNTER — Encounter: Payer: 59 | Admitting: Rehabilitation

## 2012-06-16 ENCOUNTER — Encounter: Payer: 59 | Admitting: Rehabilitation

## 2013-04-24 ENCOUNTER — Ambulatory Visit (HOSPITAL_BASED_OUTPATIENT_CLINIC_OR_DEPARTMENT_OTHER)
Admission: RE | Admit: 2013-04-24 | Discharge: 2013-04-24 | Disposition: A | Discharge status: Home / Self Care | Payer: 59 | Source: Ambulatory Visit | Attending: Family Medicine | Admitting: Family Medicine

## 2013-04-24 ENCOUNTER — Other Ambulatory Visit (HOSPITAL_BASED_OUTPATIENT_CLINIC_OR_DEPARTMENT_OTHER): Payer: Self-pay | Admitting: Family Medicine

## 2013-04-24 DIAGNOSIS — E291 Testicular hypofunction: Secondary | ICD-10-CM

## 2013-04-24 DIAGNOSIS — M549 Dorsalgia, unspecified: Secondary | ICD-10-CM | POA: Insufficient documentation

## 2013-04-24 DIAGNOSIS — N200 Calculus of kidney: Secondary | ICD-10-CM

## 2013-04-24 DIAGNOSIS — R319 Hematuria, unspecified: Secondary | ICD-10-CM

## 2013-06-11 ENCOUNTER — Encounter: Payer: Self-pay | Admitting: Emergency Medicine

## 2013-06-11 ENCOUNTER — Observation Stay (HOSPITAL_BASED_OUTPATIENT_CLINIC_OR_DEPARTMENT_OTHER)
Admission: EM | Admit: 2013-06-11 | Discharge: 2013-06-19 | DRG: 872 | Disposition: A | Discharge status: Home / Self Care | Payer: 59 | Attending: Internal Medicine | Admitting: Internal Medicine

## 2013-06-11 ENCOUNTER — Emergency Department (INDEPENDENT_AMBULATORY_CARE_PROVIDER_SITE_OTHER)
Admission: EM | Admit: 2013-06-11 | Discharge: 2013-06-11 | Disposition: A | Discharge status: Home / Self Care | Payer: 59 | Source: Home / Self Care | Attending: Emergency Medicine | Admitting: Emergency Medicine

## 2013-06-11 ENCOUNTER — Emergency Department (HOSPITAL_BASED_OUTPATIENT_CLINIC_OR_DEPARTMENT_OTHER): Payer: 59

## 2013-06-11 ENCOUNTER — Encounter (HOSPITAL_BASED_OUTPATIENT_CLINIC_OR_DEPARTMENT_OTHER): Payer: Self-pay | Admitting: Emergency Medicine

## 2013-06-11 DIAGNOSIS — IMO0001 Reserved for inherently not codable concepts without codable children: Secondary | ICD-10-CM | POA: Diagnosis present

## 2013-06-11 DIAGNOSIS — R509 Fever, unspecified: Secondary | ICD-10-CM

## 2013-06-11 DIAGNOSIS — J039 Acute tonsillitis, unspecified: Secondary | ICD-10-CM

## 2013-06-11 DIAGNOSIS — H5710 Ocular pain, unspecified eye: Secondary | ICD-10-CM | POA: Diagnosis present

## 2013-06-11 DIAGNOSIS — R6510 Systemic inflammatory response syndrome (SIRS) of non-infectious origin without acute organ dysfunction: Principal | ICD-10-CM | POA: Diagnosis present

## 2013-06-11 DIAGNOSIS — R21 Rash and other nonspecific skin eruption: Secondary | ICD-10-CM | POA: Diagnosis present

## 2013-06-11 DIAGNOSIS — M545 Low back pain, unspecified: Secondary | ICD-10-CM

## 2013-06-11 DIAGNOSIS — R51 Headache: Secondary | ICD-10-CM

## 2013-06-11 DIAGNOSIS — D696 Thrombocytopenia, unspecified: Secondary | ICD-10-CM | POA: Diagnosis present

## 2013-06-11 DIAGNOSIS — M130 Polyarthritis, unspecified: Secondary | ICD-10-CM

## 2013-06-11 DIAGNOSIS — R079 Chest pain, unspecified: Secondary | ICD-10-CM

## 2013-06-11 DIAGNOSIS — J029 Acute pharyngitis, unspecified: Secondary | ICD-10-CM | POA: Diagnosis present

## 2013-06-11 DIAGNOSIS — J351 Hypertrophy of tonsils: Secondary | ICD-10-CM | POA: Diagnosis present

## 2013-06-11 DIAGNOSIS — R519 Headache, unspecified: Secondary | ICD-10-CM

## 2013-06-11 DIAGNOSIS — R1310 Dysphagia, unspecified: Secondary | ICD-10-CM | POA: Diagnosis present

## 2013-06-11 DIAGNOSIS — M542 Cervicalgia: Secondary | ICD-10-CM

## 2013-06-11 DIAGNOSIS — M129 Arthropathy, unspecified: Secondary | ICD-10-CM | POA: Diagnosis present

## 2013-06-11 DIAGNOSIS — M2550 Pain in unspecified joint: Secondary | ICD-10-CM | POA: Diagnosis present

## 2013-06-11 DIAGNOSIS — R1314 Dysphagia, pharyngoesophageal phase: Secondary | ICD-10-CM

## 2013-06-11 HISTORY — DX: Calculus of kidney: N20.0

## 2013-06-11 HISTORY — DX: Rickettsiosis, unspecified: A79.9

## 2013-06-11 LAB — POCT URINALYSIS DIP (MANUAL ENTRY)
Bilirubin, UA: NEGATIVE
Blood, UA: NEGATIVE
Glucose, UA: NEGATIVE
Ketones, POC UA: NEGATIVE
Leukocytes, UA: NEGATIVE
Nitrite, UA: NEGATIVE
Protein Ur, POC: NEGATIVE
Spec Grav, UA: 1.025 (ref 1.005–1.03)
Urobilinogen, UA: NEGATIVE (ref 0–1)
pH, UA: 7 (ref 5–8)

## 2013-06-11 LAB — POCT CBC W AUTO DIFF (K'VILLE URGENT CARE)

## 2013-06-11 LAB — CBC WITH DIFFERENTIAL/PLATELET
BASOS ABS: 0 10*3/uL (ref 0.0–0.1)
BASOS PCT: 0 % (ref 0–1)
EOS ABS: 0 10*3/uL (ref 0.0–0.7)
Eosinophils Relative: 0 % (ref 0–5)
HCT: 44.3 % (ref 39.0–52.0)
Hemoglobin: 15.9 g/dL (ref 13.0–17.0)
Lymphocytes Relative: 12 % (ref 12–46)
Lymphs Abs: 0.8 10*3/uL (ref 0.7–4.0)
MCH: 31.3 pg (ref 26.0–34.0)
MCHC: 35.9 g/dL (ref 30.0–36.0)
MCV: 87.2 fL (ref 78.0–100.0)
MONO ABS: 0.8 10*3/uL (ref 0.1–1.0)
Monocytes Relative: 13 % — ABNORMAL HIGH (ref 3–12)
Neutro Abs: 4.7 10*3/uL (ref 1.7–7.7)
Neutrophils Relative %: 75 % (ref 43–77)
Platelets: 171 10*3/uL (ref 150–400)
RBC: 5.08 MIL/uL (ref 4.22–5.81)
RDW: 13.2 % (ref 11.5–15.5)
WBC: 6.3 10*3/uL (ref 4.0–10.5)

## 2013-06-11 LAB — POCT INFLUENZA A/B
Influenza A, POC: NEGATIVE
Influenza A, POC: NEGATIVE
Influenza B, POC: NEGATIVE
Influenza B, POC: NEGATIVE

## 2013-06-11 LAB — RAPID STREP SCREEN (MED CTR MEBANE ONLY): STREPTOCOCCUS, GROUP A SCREEN (DIRECT): NEGATIVE

## 2013-06-11 LAB — I-STAT CG4 LACTIC ACID, ED: Lactic Acid, Venous: 1.38 mmol/L (ref 0.5–2.2)

## 2013-06-11 MED ORDER — ACETAMINOPHEN 500 MG PO TABS
1000.0000 mg | ORAL_TABLET | Freq: Once | ORAL | Status: AC
  Administered 2013-06-11: 1000 mg via ORAL

## 2013-06-11 MED ORDER — AMOXICILLIN 500 MG PO CAPS: 500.0000 mg | ORAL_CAPSULE | Freq: Three times a day (TID) | ORAL | Status: DC

## 2013-06-11 MED ORDER — ONDANSETRON HCL 4 MG/2ML IJ SOLN
4.0000 mg | Freq: Once | INTRAMUSCULAR | Status: AC
  Administered 2013-06-11: 4 mg via INTRAVENOUS
  Administered 2013-06-11: 23:00:00 via INTRAVENOUS

## 2013-06-11 MED ORDER — KETOROLAC TROMETHAMINE 60 MG/2ML IM SOLN
60.0000 mg | Freq: Once | INTRAMUSCULAR | Status: AC
  Administered 2013-06-11: 60 mg via INTRAMUSCULAR

## 2013-06-11 MED ORDER — SODIUM CHLORIDE 0.9 % IV SOLN
Freq: Once | INTRAVENOUS | Status: AC
  Administered 2013-06-11: 23:00:00 via INTRAVENOUS

## 2013-06-11 MED ORDER — ONDANSETRON HCL 4 MG/2ML IJ SOLN
INTRAMUSCULAR | Status: AC
  Filled 2013-06-11: qty 2

## 2013-06-11 MED ORDER — ACETAMINOPHEN 500 MG PO TABS
ORAL_TABLET | ORAL | Status: AC
  Administered 2013-06-11: 1000 mg
  Filled 2013-06-11: qty 2

## 2013-06-11 MED ORDER — SODIUM CHLORIDE 0.9 % IV SOLN
Freq: Once | INTRAVENOUS | Status: AC
  Administered 2013-06-12: via INTRAVENOUS

## 2013-06-11 MED ORDER — TETRACYCLINE HCL 250 MG PO CAPS: 250.0000 mg | ORAL_CAPSULE | Freq: Four times a day (QID) | ORAL | Status: DC

## 2013-06-11 MED ORDER — MINOCYCLINE HCL 100 MG PO CAPS: 100.0000 mg | ORAL_CAPSULE | Freq: Two times a day (BID) | ORAL | Status: DC

## 2013-06-11 NOTE — ED Provider Notes (Signed)
CSN: [id number]     Arrival date & time 06/11/13  1135 History   First MD Initiated Contact with Patient 06/11/13 1146     Chief Complaint  Patient presents with  . Generalized Body Aches   Here with his father who helps bring him in. HPI FLU  HPI : 38 year old male. Was well 2 days ago without problems. c/o Progressively worsening body aches, joint aches, fever and chills. Acute onset of symptoms for 1 day. Fever to 102 with chills, sweats, myalgias, fatigue, headache. No hot or swollen joints although his joints hurt severely all over. Symptoms are progressively worsening, despite trying OTC fever reducing medicine and rest and fluids. Has decreased appetite, but tolerating some liquids by mouth. No history of recent tick bite. He states he has no prior history of RMSF or Lyme's disease. 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  History of kidney stones in the past, but denies hematuria or dysuria or any urinary symptoms now.  Review of Systems: Positive for fatigue. Had mild nonspecific abdominal discomfort 1/10. No other G.I. symptoms Negative for rash,nasal congestion,sore throat,  swollen  neck glands,  cough, chest pain, shortness of breath ,acute vision changes, stiff neck, focal weakness, syncope, seizures, respiratory distress, vomiting, diarrhea, GU symptoms. No syncope or focal neurologic symptoms.  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 PCP is Dr. Cammie Sickle in [place], [place]  Past Medical History  Diagnosis Date  . Rickettsia infection    Past Surgical History  Procedure Laterality Date  . Cholecystectomy     Family History  Problem Relation Age of Onset  . Diabetes Mother   . Heart disease Mother   . Heart failure Mother   . Heart attack Mother   . Hyperlipidemia Mother   . Hypertension Mother   . Hyperlipidemia Father   . Sudden death  Neg Hx    History  Substance Use Topics  . Smoking status: Never Smoker   . Smokeless tobacco: Not on file  . Alcohol Use: No    Review of Systems  All other systems reviewed and are negative.   Allergies  Review of patient's allergies indicates no known allergies.  Home Medications   Prior to Admission medications   Medication Sig Start Date End Date Taking? Authorizing Provider  buPROPion (WELLBUTRIN XL) 300 MG 24 hr tablet Take 300 mg by mouth daily.   Yes Historical Provider, MD  cyclobenzaprine (FLEXERIL) 5 MG tablet Take 1 tablet (5 mg total) by mouth every 8 (eight) hours as needed for muscle spasms. 05/11/12   Dene Gentry, MD  predniSONE (DELTASONE) 10 MG tablet 6 tabs po day 1, 5 tabs po day 2, 4 tabs po day 3, 3 tabs po day 4, 2 tabs po day 5, 1 tab po day 6 05/11/12   Dene Gentry, MD   Reviewed the above medications. He finished taking Flexeril and six daily prednisone dose pack a year ago. BP 114/74  Pulse 114  Temp(Src) 101.6 F (38.7 C) (Oral)  Resp 16  Ht 5\' 11"  (1.803 m)  Wt 250 lb (113.399 kg)  BMI 34.88 kg/m2  SpO2 98% Physical Exam  Nursing note and vitals reviewed. Constitutional: He appears well-developed and well-nourished. He appears ill (very fatigued, but no cardiorespiratory distress).  He is alert and cooperative, but he appears extremely uncomfortable, lying on  stretcher. Appears mildly toxic  HENT:  Head: Normocephalic and atraumatic.  Right Ear: Tympanic membrane and external ear normal.  Left Ear: Tympanic membrane and external ear normal.  Nose: Rhinorrhea (minimal) present. Right sinus exhibits no maxillary sinus tenderness and no frontal sinus tenderness. Left sinus exhibits no maxillary sinus tenderness and no frontal sinus tenderness.  Mouth/Throat: Mucous membranes are normal. No oral lesions. No oropharyngeal exudate, posterior oropharyngeal edema or posterior oropharyngeal erythema.  Eyes: Conjunctivae are normal. Right eye  exhibits no discharge. Left eye exhibits no discharge. No scleral icterus.  Neck: Neck supple. No JVD present. No rigidity. No tracheal deviation present. No Brudzinski's sign and no Kernig's sign noted.  No adenopathy  Cardiovascular: Regular rhythm and normal heart sounds.  Exam reveals no gallop and no friction rub.   No murmur heard. Tachycardic, regular rate and rhythm without murmur  Pulmonary/Chest: Breath sounds normal. No stridor. No respiratory distress. He has no wheezes. He has no rales.  Abdominal: Soft. Bowel sounds are normal. He exhibits no distension and no mass. There is no hepatosplenomegaly. There is no rebound, no guarding, no CVA tenderness, no tenderness at McBurney's point and negative Murphy's sign.  Abdomen is soft. Minimal diffuse tenderness, not reproducible. No masses, guarding, or rebound.  Musculoskeletal: He exhibits no edema.  He is diffusely tender in all muscles and joints of shoulders, upper extremities, trunk, lower extremities. No specific calf tenderness or swelling or heat or cords. No hot or swollen joints.  Lymphadenopathy:    He has cervical adenopathy (mild shoddy anterior cervical nodes).  Neurological: He is alert. No cranial nerve deficit or sensory deficit.  He avoids moving because of muscle pain, but no focal motor or sensory deficit  Skin: Skin is warm and intact. No rash noted. He is diaphoretic.  Psychiatric: He has a normal mood and affect.    ED Course  Procedures (including critical care time) Labs Review Labs Reviewed  B. BURGDORFI ANTIBODIES  ROCKY MTN SPOTTED FVR AB, IGG-BLOOD  ROCKY MTN SPOTTED FVR AB, IGM-BLOOD  INFLUENZA PANEL BY PCR (TYPE A & B, H1N1)  POCT URINALYSIS DIP (MANUAL ENTRY)  POCT CBC W AUTO DIFF ([facility])  POCT INFLUENZA A/B    Results for orders placed during the hospital encounter of 06/11/13  POCT URINALYSIS DIP (MANUAL ENTRY)      Result Value Ref Range   Color, UA light yellow      Clarity, UA clear     Glucose, UA neg     Bilirubin, UA negative     Bilirubin, UA negative     Spec Grav, UA 1.025  1.005 - 1.03   Blood, UA negative     pH, UA 7.0  5 - 8   Protein Ur, POC negative     Urobilinogen, UA negative  0 - 1   Nitrite, UA Negative     Leukocytes, UA Negative    POCT CBC W AUTO DIFF ([facility])      Result Value Ref Range   WBC    4.5 - 10.5 K/uL   Lymphocytes relative %    15 - 45 %   Monocytes relative %    2 - 10 %   Neutrophils relative % (GR)    44 - 76 %   Lymphocytes absolute    0.1 - 1.8 K/uL   Monocyes absolute    0.1 - 1 K/uL   Neutrophils absolute (GR#)    1.7 - 7.7  K/uL   RBC    4.2 - 5.8 MIL/uL   Hemoglobin    13 - 17 g/dL   Hematocrit    38.5 - 51 %   MCV    80 - 98 fL   MCH    26.5 - 32.5 pg   MCHC    32.5 - 36.9 g/dL   RDW    11.6 - 14 %   Platelet count    140 - 400 K/uL   MPV    7.8 - 11 fL  POCT INFLUENZA A/B      Result Value Ref Range   Influenza A, POC Negative     Influenza B, POC Negative      12:55 PM-ordered Toradol 60 mg IM stat and Tylenol 1000 mg by mouth stat. 1:30 PM--ibuprofen 800 mg by mouth stat given at patient's request, after risks benefits alternatives discussed.  Reviewed with patient that UA normal.rapid flu tests negative. CBC within normal limits,  WBC 5.6, slight left shift with 80% granulocytes. Hemoglobin normal 15.2. Platelets normal 156,000   MDM   1. Febrile illness, acute   acute febrile illness with severe myalgias and arthralgias times one day, without any other focal findings. In the differential is our RMSF, Lymes disease, influenza, and other viral/infectious causes.--in my opinion,RMSF and Lymes are more likely given the time of year.  After the Toradol and Tylenol and ibuprofen given, patient reevaluated with stable vital signs. He felt somewhat better with somewhat less myalgias and arthralgias but still uncomfortable, but he was able to ambulate on his own.-- temp 100.4.  Pulse 102, BP 116/78. He was able to tolerate sips of water without nausea or vomiting.  OtherTreatment options discussed at length as well as risks, benefits, alternatives.  Over 45 minutes spent, greater than 50% of the time spent for counseling and coordination of care. Patient voiced understanding and agreement with the following plans:  He prefers to use OTC ibuprofen for pain and fever reduction. Push clear liquids and advance diet as tolerated.  At first, I was going to prescribe doxycycline for presumptive RMSF ( initially, he indicated that he had no drug allergies or intolerances),but after further discussion, patient states that he remembers having had GI upset from this in the past, but can tolerate other tetracycline derivatives. I initially prescribed tetracycline, but call back from pharmacist stated that tetracycline not available, so I verbally changed this to Minocycline 100 mg by mouth twice a day. #20. No refill For initial coverage for Lymes, add Amoxicillin 500 by mouth 3 times a day, 14 day supply.  Sending off nasal swab PCR flu tests and antibody tests for Lyme's disease and RMSF.--patient agrees. When these results comeback, we can decide if any change in therapy is needed. Patient declined starting Tamiflu now, but would start Tamiflu if the PCR flu test came back positive.  An After Visit Summary was printed and given to the patient.--questions invited and answered.  Excused from work for at least two days. Follow-up with your primary care doctor in 2 days if not improving, or go to ER sooner if new or worsening symptoms or any red flags. Precautions discussed. Red flags discussed. Questions invited and answered. Patient and father voiced understanding and agreement.   Jacqulyn Cane, MD 06/11/13 [phone]

## 2013-06-11 NOTE — ED Notes (Signed)
Last night had sudden onset generalized aches that ibuprofen did not help. No injuries. Does have hx tick bite one year ago and brown recluse spider bite 7 years ago. 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

## 2013-06-11 NOTE — ED Provider Notes (Signed)
CSN: [id number]     Arrival date & time 06/11/13  2242 History   First MD Initiated Contact with Patient 06/11/13 2303     Chief Complaint  Patient presents with  . Generalized Body Aches  . Fever     (Consider location/radiation/quality/duration/timing/severity/associated sxs/prior Treatment) HPI Comments: 38 year old male with no significant medical history presents with fever, bodyaches, cp and joint pains since Saturday.  Patient has had gradually worsening fevers up to 103 with all joints hurting. No sick contacts or history of similar. Patient has no recent travel, no known HIV, no sick contacts. Patient's vaccines are up-to-date. Nothing improves the symptoms. His temperature did improve with Motrin and Tylenol. Patient was seen at urgent care and given amoxicillin and minocycline. He denies any recent insect bites, can't pain or tick exposures. His neck pain without stiffness.  Patient is a 38 y.o. male presenting with fever. The history is provided by the patient.  Fever Associated symptoms: chest pain (ache similar to the rest of his body pains.), chills, headaches and nausea   Associated symptoms: no congestion, no cough, no dysuria, no rash and no vomiting     Past Medical History  Diagnosis Date  . Rickettsia infection   . Kidney stone    Past Surgical History  Procedure Laterality Date  . Cholecystectomy     Family History  Problem Relation Age of Onset  . Diabetes Mother   . Heart disease Mother   . Heart failure Mother   . Heart attack Mother   . Hyperlipidemia Mother   . Hypertension Mother   . Hyperlipidemia Father   . Sudden death Neg Hx    History  Substance Use Topics  . Smoking status: Never Smoker   . Smokeless tobacco: Not on file  . Alcohol Use: No    Review of Systems  Constitutional: Positive for fever, chills and appetite change.  HENT: Negative for congestion.   Eyes: Negative for visual disturbance.  Respiratory: Negative for cough and  shortness of breath.   Cardiovascular: Positive for chest pain (ache similar to the rest of his body pains.). Negative for leg swelling.  Gastrointestinal: Positive for nausea. Negative for vomiting and abdominal pain.  Genitourinary: Negative for dysuria and flank pain.  Musculoskeletal: Positive for arthralgias, back pain and neck pain. Negative for neck stiffness.  Skin: Negative for rash.  Neurological: Positive for light-headedness and headaches. Negative for seizures and syncope. Tremors: gradual onset.      Allergies  Doxycycline and Guaifenesin & derivatives  Home Medications   Prior to Admission medications   Medication Sig Start Date End Date Taking? Authorizing Provider  amoxicillin (AMOXIL) 500 MG capsule Take 1 capsule (500 mg total) by mouth 3 (three) times daily. X14 days 06/11/13  Yes Jacqulyn Cane, MD  buPROPion (WELLBUTRIN XL) 300 MG 24 hr tablet Take 300 mg by mouth daily.   Yes Historical Provider, MD  minocycline (MINOCIN) 100 MG capsule Take 1 capsule (100 mg total) by mouth 2 (two) times daily. 06/11/13  Yes Jacqulyn Cane, MD  cyclobenzaprine (FLEXERIL) 5 MG tablet Take 1 tablet (5 mg total) by mouth every 8 (eight) hours as needed for muscle spasms. 05/11/12   Dene Gentry, MD  predniSONE (DELTASONE) 10 MG tablet 6 tabs po day 1, 5 tabs po day 2, 4 tabs po day 3, 3 tabs po day 4, 2 tabs po day 5, 1 tab po day 6 05/11/12   Dene Gentry, MD   BP 150/93  Pulse 120  Temp(Src) 103 F (39.4 C) (Oral)  Resp 24  Ht 5\' 11"  (1.803 m)  Wt 250 lb (113.399 kg)  BMI 34.88 kg/m2  SpO2 94% Physical Exam  Nursing note and vitals reviewed. Constitutional: He is oriented to person, place, and time. He appears well-developed and well-nourished.  HENT:  Head: Normocephalic and atraumatic.  Mild dry mucous membranes, mild tenderness with range of motion of the neck, no meningismus  Eyes: Conjunctivae are normal. Right eye exhibits no discharge. Left eye exhibits no discharge.   Neck: Normal range of motion. Neck supple. No tracheal deviation present.  Cardiovascular: Regular rhythm.  Tachycardia present.   Pulmonary/Chest: Effort normal and breath sounds normal.  Abdominal: Soft. He exhibits no distension. There is no tenderness. There is no guarding.  Musculoskeletal: He exhibits tenderness (diffuse muscular and body tenderness to palpation, no joint swelling or isolated joint warmth.). He exhibits no edema.  Neurological: He is alert and oriented to person, place, and time. He has normal strength. No cranial nerve deficit or sensory deficit. GCS eye subscore is 4. GCS verbal subscore is 5. GCS motor subscore is 6.  Skin: Skin is warm. No rash noted.  No rash noted, specifically no rash on palms or soles, no petechia or purpura.  Psychiatric: He has a normal mood and affect.    ED Course  LUMBAR PUNCTURE Date/Time: 06/12/2013 2:53 AM Performed by: Mariea Clonts Authorized by: Mariea Clonts Consent: Verbal consent obtained. written consent obtained. Consent given by: patient Patient understanding: patient states understanding of the procedure being performed Patient consent: the patient's understanding of the procedure matches consent given Procedure consent: procedure consent matches procedure scheduled Patient identity confirmed: verbally with patient Time out: Immediately prior to procedure a "time out" was called to verify the correct patient, procedure, equipment, support staff and site/side marked as required. Indications: evaluation for infection Anesthesia: local infiltration Local anesthetic: lidocaine 1% without epinephrine Patient sedated: no Lumbar space: L3-L4 interspace Patient's position: sitting Needle gauge: 22 Needle length: 2.5 in Number of attempts: 2 Comments: Unable to obtain CSF   (including critical care time)  Labs Review Labs Reviewed  CBC WITH DIFFERENTIAL - Abnormal; Notable for the following:    Monocytes Relative  13 (*)    All other components within normal limits  COMPREHENSIVE METABOLIC PANEL - Abnormal; Notable for the following:    Glucose, Bld 113 (*)    GFR calc non Af Amer 84 (*)    All other components within normal limits  URINALYSIS, ROUTINE W REFLEX MICROSCOPIC - Abnormal; Notable for the following:    Specific Gravity, Urine 1.035 (*)    All other components within normal limits  RAPID STREP SCREEN  CULTURE, GROUP A STREP  CSF CULTURE  GRAM STAIN  CULTURE, BLOOD (ROUTINE X 2)  CULTURE, BLOOD (ROUTINE X 2)  SEDIMENTATION RATE  CK TOTAL AND CKMB  TROPONIN I  CSF CELL COUNT WITH DIFFERENTIAL  GLUCOSE, CSF  PROTEIN, CSF  I-STAT CG4 LACTIC ACID, ED    Imaging Review Dg Chest 2 View  06/11/2013   CLINICAL DATA:  Generalized body aches and fever.  EXAM: CHEST  2 VIEW  COMPARISON:  CT ABD/PELV WO CM dated 04/24/2013  FINDINGS: Cardiomediastinal silhouette is unremarkable. Strandy densities in left lung base. No pleural effusions. No pneumothorax. Azygos fissure.  Multiple EKG lines overlie the patient and may obscure subtle underlying pathology. Soft tissue planes and included osseous structures are nonsuspicious. Mild degenerative change of the  thoracic spine.  IMPRESSION: Strandy densities in left lung base likely reflect atelectasis.   Electronically Signed   By: Elon Alas   On: 06/11/2013 23:38   Ct Head Wo Contrast  06/11/2013   CLINICAL DATA:  GENERALIZED BODY ACHES FEVER  EXAM: CT HEAD WITHOUT CONTRAST  TECHNIQUE: Contiguous axial images were obtained from the base of the skull through the vertex without intravenous contrast  COMPARISON:  None available for comparison at time of study interpretation.  FINDINGS: The ventricles and sulci are normal. No intraparenchymal hemorrhage, mass effect nor midline shift. No acute large vascular territory infarcts.  No abnormal extra-axial fluid collections. Basal cisterns are patent.  No skull fracture. The included ocular globes and orbital  contents are non-suspicious. The mastoid aircells and included paranasal sinuses are well-aerated.  IMPRESSION: No acute intracranial process ; normal noncontrast CT of the head.   Electronically Signed   By: Elon Alas   On: 06/11/2013 23:44     EKG Interpretation   Date/Time:  Sunday June 11 2013 22:52:12 EDT Ventricular Rate:  122 PR Interval:  148 QRS Duration: 86 QT Interval:  298 QTC Calculation: 424 R Axis:   34 Text Interpretation:  Sinus tachycardia Otherwise normal ECG Confirmed by  Sparkle Aube  MD, Shantay Sonn ([id number]) on 06/12/2013 2:55:14 AM      MDM   Final diagnoses:  Fever  Neck pain  Chest pain  Headache   Patient with diffuse myalgias, joint pain and fever. Discussed broad differential with likely diagnosis of virus. Patient does have pretty significant headache that was gradual onset and fever. Low suspicion for bacterial meningitis however viral meningitis is on the differential. Blood work, chest x-ray, urine, septic workup, fluid bolus, antipyretics and CT head. Discussed plan for close observation ER and if no improvement plan for lumbar puncture.  Recheck patient not feeling much better, repeat fluid bolus and pain medicines. Discussed plan for lumbar puncture to look for signs of meningitis, patient agrees and understands risks and benefits. Patient signed consent. 2 attempts at lumbar puncture unsuccessful likely due to body habitus. Patient having worsening chest pain, repeat EKG and troponin. [facility] for [facility] evaluation and radiology guided lumbar puncture.  Spoke with Dr. Posey Pronto who accepted patient on telemetry. We discussed giving dose of Rocephin and acyclovir with plan for radiology guided lumbar puncture in the morning Patient comfortable with plan.  The patients results and plan were reviewed and discussed.   Any x-rays performed were personally reviewed by myself.   Differential diagnosis were  considered with the presenting HPI.   EKG reviewed  Filed Vitals:   06/11/13 2300 06/11/13 2356 06/12/13 0300  BP: 150/93 149/79 113/72  Pulse: 120 109 93  Temp: 103 F (39.4 C) 101.5 F (38.6 C) 98.3 F (36.8 C)  TempSrc: Oral Oral Oral  Resp: 24 23 12   Height: 5\' 11"  (1.803 m)    Weight: 250 lb (113.399 kg)    SpO2: 94% 98% 98%    Admission/ observation were discussed with the admitting physician, patient and/or family and they are comfortable with the plan.     Mariea Clonts, MD 06/12/13 [phone]

## 2013-06-11 NOTE — Discharge Instructions (Signed)
Ibuprofen 800 mg every 6 hours with food for pain or fever. Rx: Amoxicillin to cover possible Lyme's, Lyme's tests are pending Tetracycline, to cover possible RMSF, ---RMSF. test pending  The PCR nasal swab for flu test is pending. These pending results should be back by Tuesday.  Here in urgent care, CBC within normal limits . The rapid flu test were negative Urinalysis normal   May return to work on 06/13/2013 only if feeling better and without fever.

## 2013-06-11 NOTE — ED Notes (Addendum)
Pt reports fever, body aches, joint aches, nausea, chest pain,  that started Saturday. Pt states he is being treated for questionable Lyme Disease.

## 2013-06-12 ENCOUNTER — Inpatient Hospital Stay (HOSPITAL_COMMUNITY): Payer: 59

## 2013-06-12 DIAGNOSIS — R509 Fever, unspecified: Secondary | ICD-10-CM

## 2013-06-12 LAB — COMPREHENSIVE METABOLIC PANEL
ALBUMIN: 4.1 g/dL (ref 3.5–5.2)
ALT: 27 U/L (ref 0–53)
AST: 18 U/L (ref 0–37)
Alkaline Phosphatase: 90 U/L (ref 39–117)
BILIRUBIN TOTAL: 0.6 mg/dL (ref 0.3–1.2)
BUN: 20 mg/dL (ref 6–23)
CHLORIDE: 102 meq/L (ref 96–112)
CO2: 24 mEq/L (ref 19–32)
CREATININE: 1.1 mg/dL (ref 0.50–1.35)
Calcium: 9.7 mg/dL (ref 8.4–10.5)
GFR calc Af Amer: >90 mL/min (ref >90)
GFR calc non Af Amer: 84 mL/min — ABNORMAL LOW (ref >90)
Glucose, Bld: 113 mg/dL — ABNORMAL HIGH (ref 70–99)
Potassium: 3.9 mEq/L (ref 3.7–5.3)
Sodium: 140 mEq/L (ref 137–147)
TOTAL PROTEIN: 7.1 g/dL (ref 6.0–8.3)

## 2013-06-12 LAB — TSH: TSH: 1.07 u[IU]/mL (ref 0.350–4.500)

## 2013-06-12 LAB — CBC
HEMATOCRIT: 39.2 % (ref 39.0–52.0)
Hemoglobin: 13.6 g/dL (ref 13.0–17.0)
MCH: 30.4 pg (ref 26.0–34.0)
MCHC: 34.7 g/dL (ref 30.0–36.0)
MCV: 87.5 fL (ref 78.0–100.0)
Platelets: 121 10*3/uL — ABNORMAL LOW (ref 150–400)
RBC: 4.48 MIL/uL (ref 4.22–5.81)
RDW: 13.2 % (ref 11.5–15.5)
WBC: 6.5 10*3/uL (ref 4.0–10.5)

## 2013-06-12 LAB — CK TOTAL AND CKMB (NOT AT ARMC)
CK, MB: <1 ng/mL (ref 0.3–4.0)
Total CK: 69 U/L (ref 7–232)

## 2013-06-12 LAB — URINALYSIS, ROUTINE W REFLEX MICROSCOPIC
BILIRUBIN URINE: NEGATIVE
Glucose, UA: NEGATIVE mg/dL
Hgb urine dipstick: NEGATIVE
Ketones, ur: NEGATIVE mg/dL
LEUKOCYTES UA: NEGATIVE
NITRITE: NEGATIVE
PH: 5.5 (ref 5.0–8.0)
Protein, ur: NEGATIVE mg/dL
Specific Gravity, Urine: 1.035 — ABNORMAL HIGH (ref 1.005–1.030)
UROBILINOGEN UA: 0.2 mg/dL (ref 0.0–1.0)

## 2013-06-12 LAB — CSF CELL COUNT WITH DIFFERENTIAL
RBC Count, CSF: 48 /mm3 — ABNORMAL HIGH
TUBE #: 1
WBC, CSF: 3 /mm3 (ref 0–5)

## 2013-06-12 LAB — CREATININE, SERUM
Creatinine, Ser: 0.94 mg/dL (ref 0.50–1.35)
GFR calc non Af Amer: >90 mL/min (ref >90)

## 2013-06-12 LAB — ROCKY MTN SPOTTED FVR AB, IGG-BLOOD: RMSF IgG: 0.39 IV

## 2013-06-12 LAB — SEDIMENTATION RATE: SED RATE: 3 mm/h (ref 0–16)

## 2013-06-12 LAB — PROTEIN AND GLUCOSE, CSF
GLUCOSE CSF: 64 mg/dL (ref 43–76)
Total  Protein, CSF: 27 mg/dL (ref 15–45)

## 2013-06-12 LAB — TROPONIN I: Troponin I: <0.3 ng/mL (ref <0.30)

## 2013-06-12 LAB — ROCKY MTN SPOTTED FVR AB, IGM-BLOOD: ROCKY MTN SPOTTED FEVER, IGM: 0.13 IV

## 2013-06-12 LAB — GRAM STAIN

## 2013-06-12 LAB — B. BURGDORFI ANTIBODIES: B burgdorferi Ab IgG+IgM: 0.36 {ISR}

## 2013-06-12 LAB — SAVE SMEAR

## 2013-06-12 MED ORDER — ACETAMINOPHEN 325 MG PO TABS
650.0000 mg | ORAL_TABLET | ORAL | Status: DC | PRN
  Administered 2013-06-12: 650 mg via ORAL
  Filled 2013-06-12: qty 2

## 2013-06-12 MED ORDER — BUPROPION HCL ER (XL) 300 MG PO TB24
300.0000 mg | ORAL_TABLET | Freq: Every day | ORAL | Status: DC
  Administered 2013-06-12 – 2013-06-19 (×8): 300 mg via ORAL
  Filled 2013-06-12 (×8): qty 1

## 2013-06-12 MED ORDER — ALUM & MAG HYDROXIDE-SIMETH 200-200-20 MG/5ML PO SUSP: 30.0000 mL | Freq: Four times a day (QID) | ORAL | Status: DC | PRN

## 2013-06-12 MED ORDER — MORPHINE SULFATE 4 MG/ML IJ SOLN
6.0000 mg | Freq: Once | INTRAMUSCULAR | Status: AC
  Administered 2013-06-12: 6 mg via INTRAVENOUS
  Filled 2013-06-12: qty 2

## 2013-06-12 MED ORDER — SODIUM CHLORIDE 0.9 % IJ SOLN
3.0000 mL | Freq: Two times a day (BID) | INTRAMUSCULAR | Status: DC
  Administered 2013-06-12 – 2013-06-18 (×6): 3 mL via INTRAVENOUS

## 2013-06-12 MED ORDER — ONDANSETRON HCL 4 MG PO TABS: 4.0000 mg | ORAL_TABLET | Freq: Four times a day (QID) | ORAL | Status: DC | PRN

## 2013-06-12 MED ORDER — HYDROMORPHONE HCL PF 1 MG/ML IJ SOLN: 0.5000 mg | INTRAMUSCULAR | Status: DC | PRN

## 2013-06-12 MED ORDER — SODIUM CHLORIDE 0.9 % IV SOLN
INTRAVENOUS | Status: AC
  Administered 2013-06-12: 04:00:00 via INTRAVENOUS

## 2013-06-12 MED ORDER — MORPHINE SULFATE 2 MG/ML IJ SOLN
2.0000 mg | INTRAMUSCULAR | Status: DC | PRN
  Administered 2013-06-12 – 2013-06-15 (×4): 2 mg via INTRAVENOUS
  Filled 2013-06-12 (×4): qty 1

## 2013-06-12 MED ORDER — KETOROLAC TROMETHAMINE 15 MG/ML IJ SOLN
INTRAMUSCULAR | Status: AC
  Administered 2013-06-12: 15 mg
  Filled 2013-06-12: qty 1

## 2013-06-12 MED ORDER — HYDROMORPHONE HCL PF 1 MG/ML IJ SOLN
1.0000 mg | Freq: Once | INTRAMUSCULAR | Status: AC
  Administered 2013-06-12: 1 mg via INTRAVENOUS
  Filled 2013-06-12: qty 1

## 2013-06-12 MED ORDER — DEXTROSE 5 % IV SOLN
1.0000 g | Freq: Once | INTRAVENOUS | Status: AC
  Administered 2013-06-12: 04:00:00 via INTRAVENOUS

## 2013-06-12 MED ORDER — ENOXAPARIN SODIUM 40 MG/0.4ML ~~LOC~~ SOLN
40.0000 mg | SUBCUTANEOUS | Status: DC
Start: 2013-06-12 — End: 2013-06-19
  Administered 2013-06-15: 40 mg via SUBCUTANEOUS
  Filled 2013-06-12 (×8): qty 0.4

## 2013-06-12 MED ORDER — DEXTROSE 5 % IV SOLN
1000.0000 mg | Freq: Once | INTRAVENOUS | Status: AC
  Administered 2013-06-12: 1000 mg via INTRAVENOUS

## 2013-06-12 MED ORDER — SORBITOL 70 % SOLN
30.0000 mL | Freq: Every day | Status: DC | PRN
  Filled 2013-06-12: qty 30

## 2013-06-12 MED ORDER — ACETAMINOPHEN 650 MG RE SUPP: 650.0000 mg | Freq: Four times a day (QID) | RECTAL | Status: DC | PRN

## 2013-06-12 MED ORDER — CEFTRIAXONE SODIUM 1 G IJ SOLR
INTRAMUSCULAR | Status: AC
  Filled 2013-06-12: qty 10

## 2013-06-12 MED ORDER — OXYCODONE HCL 5 MG PO TABS
5.0000 mg | ORAL_TABLET | ORAL | Status: DC | PRN
  Administered 2013-06-12 – 2013-06-17 (×7): 5 mg via ORAL
  Filled 2013-06-12 (×9): qty 1

## 2013-06-12 MED ORDER — ONDANSETRON HCL 4 MG/2ML IJ SOLN
4.0000 mg | Freq: Four times a day (QID) | INTRAMUSCULAR | Status: DC | PRN
  Administered 2013-06-14: 4 mg via INTRAVENOUS
  Filled 2013-06-12: qty 2

## 2013-06-12 MED ORDER — ONDANSETRON HCL 4 MG/2ML IJ SOLN: 4.0000 mg | Freq: Three times a day (TID) | INTRAMUSCULAR | Status: DC | PRN

## 2013-06-12 MED ORDER — KETOROLAC TROMETHAMINE 30 MG/ML IJ SOLN: 30.0000 mg | Freq: Once | INTRAMUSCULAR | Status: AC

## 2013-06-12 MED ORDER — ACETAMINOPHEN 325 MG PO TABS
650.0000 mg | ORAL_TABLET | Freq: Four times a day (QID) | ORAL | Status: DC | PRN
  Administered 2013-06-12 – 2013-06-18 (×13): 650 mg via ORAL
  Filled 2013-06-12 (×14): qty 2

## 2013-06-12 MED ORDER — SODIUM CHLORIDE 0.9 % IV SOLN
INTRAVENOUS | Status: DC
  Administered 2013-06-12 – 2013-06-13 (×4): via INTRAVENOUS

## 2013-06-12 MED ORDER — ACYCLOVIR SODIUM 50 MG/ML IV SOLN
INTRAVENOUS | Status: AC
  Administered 2013-06-12: 500 mg
  Filled 2013-06-12: qty 10

## 2013-06-12 NOTE — H&P (Addendum)
[facility] History and Physical  FRAN MCREE KWI:[id number] DOB: [birth year] DOA: 06/11/2013  Referring physician:  PCP: Stefanie Libel, MD   Chief Complaint: Fever  HPI: Brian [name] is a 38 y.o. male  significant past medical history as a transfer from [facility] for further evaluation of fevers and chills. Patient states that symptoms started last Saturday, developing generalized weakness, malaise, myalgias, bilateral arthralgias, headaches, fevers and chills. Having a temperature as high as 103. 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 He denies recent travel, tic bites, rashes or hunting.  18 at the urgent care Center where he was prescribed amoxicillin and minocycline. Patient going to [place] overnight where workup was unremarkable. CBC showed a white count of 6.3 with a lactate of 1.3. Chest X-Ray  did not an reveal acute infiltrate is a CT scan of brain showed no acute intracranial abnormalities. [name] is unremarkable. Blood cultures were taken, patient was given a dose of ceftriaxone 1 mg IV as well as acyclovir grams IV x1. Tubes were obtained. 2 attempts were made at performing lumbar puncture however these were unsuccessful. Patient was transferred to [facility] for further evaluation and treatment.                                                                  Review of Systems:  Constitutional:  No weight loss, Positive for night sweats, Fevers, chills, fatigue.  HEENT:  Positive for headaches, no Difficulty swallowing,Tooth/dental problems,Sore throat,  No sneezing, itching, ear ache, nasal congestion, post nasal drip,  Cardio-vascular:  No chest pain, Orthopnea, PND, swelling in lower extremities, anasarca, dizziness, palpitations  GI:  No heartburn, indigestion, abdominal pain, nausea, vomiting, diarrhea, change in bowel habits, loss of appetite  Resp:  No shortness of breath with exertion or at rest. No  excess mucus, no productive cough, No non-productive cough, No coughing up of blood.No change in color of mucus.No wheezing.No chest wall deformity  Skin:  no rash or lesions.  GU:  no dysuria, change in color of urine, no urgency or frequency. No flank pain.  Musculoskeletal:  Positive for joint pain, decreased range of motion, and back pain.  Psych:  No change in mood or affect. No depression or anxiety. No memory loss.   Past Medical History  Diagnosis Date  . Rickettsia infection   . Kidney stone    Past Surgical History  Procedure Laterality Date  . Cholecystectomy     Social History:  reports that he has never smoked. He does not have any smokeless tobacco history on file. He reports that he does not drink alcohol or use illicit drugs.  Allergies  Allergen Reactions  . Doxycycline Other (See Comments)    Reaction unknown  . Guaifenesin & Derivatives Other (See Comments)    Reaction unknown    Family History  Problem Relation Age of Onset  . Diabetes Mother   . Heart disease Mother   . Heart failure Mother   . Heart attack Mother   . Hyperlipidemia Mother   . Hypertension Mother   . Hyperlipidemia Father   . Sudden death Neg Hx      Prior to Admission medications  Medication Sig Start Date End Date Taking? Authorizing Provider  amoxicillin (AMOXIL) 500 MG capsule Take 1 capsule (500 mg total) by mouth 3 (three) times daily. X14 days 06/11/13  Yes Jacqulyn Cane, MD  buPROPion (WELLBUTRIN XL) 300 MG 24 hr tablet Take 300 mg by mouth daily.   Yes Historical Provider, MD  minocycline (MINOCIN) 100 MG capsule Take 1 capsule (100 mg total) by mouth 2 (two) times daily. 06/11/13  Yes Jacqulyn Cane, MD   Physical Exam: Filed Vitals:   06/12/13 0537  BP: 127/75  Pulse: 97  Temp: 98.9 F (37.2 C)  Resp: 16    BP 127/75  Pulse 97  Temp(Src) 98.9 F (37.2 C) (Oral)  Resp 16  Ht 5\' 11"  (1.803 m)  Wt 113.399 kg (250 lb)  BMI 34.88 kg/m2  SpO2 95%  General:  Ill  appearing, no acute distress, awake and alert, answering questions appropriately, mentating well.  Eyes: PERRL, normal lids, irises & conjunctiva ENT: grossly normal hearing, lips & tongue Neck: no LAD, masses or thyromegaly Cardiovascular: RRR, no m/r/g. No LE edema. Telemetry: SR, no arrhythmias  Respiratory: CTA bilaterally, no w/r/r. Normal respiratory effort. Abdomen: soft, ntnd Skin: no rash or induration seen on limited exam Musculoskeletal: grossly normal tone BUE/BLE Psychiatric: grossly normal mood and affect, speech fluent and appropriate Neurologic: grossly non-focal.          Labs on Admission:  Basic Metabolic Panel:  Recent Labs Lab 06/11/13 2329  NA 140  K 3.9  CL 102  CO2 24  GLUCOSE 113*  BUN 20  CREATININE 1.10  CALCIUM 9.7   Liver Function Tests:  Recent Labs Lab 06/11/13 2329  AST 18  ALT 27  ALKPHOS 90  BILITOT 0.6  PROT 7.1  ALBUMIN 4.1   No results found for this basename: LIPASE, AMYLASE,  in the last 168 hours No results found for this basename: AMMONIA,  in the last 168 hours CBC:  Recent Labs Lab 06/11/13 2329  WBC 6.3  NEUTROABS 4.7  HGB 15.9  HCT 44.3  MCV 87.2  PLT 171   Cardiac Enzymes:  Recent Labs Lab 06/11/13 2354 06/12/13  CKTOTAL 69  --   CKMB <1.0  --   TROPONINI  --  <0.30    BNP (last 3 results) No results found for this basename: PROBNP,  in the last 8760 hours CBG: No results found for this basename: GLUCAP,  in the last 168 hours  Radiological Exams on Admission: Dg Chest 2 View  06/11/2013   CLINICAL DATA:  Generalized body aches and fever.  EXAM: CHEST  2 VIEW  COMPARISON:  CT ABD/PELV WO CM dated 04/24/2013  FINDINGS: Cardiomediastinal silhouette is unremarkable. Strandy densities in left lung base. No pleural effusions. No pneumothorax. Azygos fissure.  Multiple EKG lines overlie the patient and may obscure subtle underlying pathology. Soft tissue planes and included osseous structures are  nonsuspicious. Mild degenerative change of the thoracic spine.  IMPRESSION: Strandy densities in left lung base likely reflect atelectasis.   Electronically Signed   By: Elon Alas   On: 06/11/2013 23:38   Ct Head Wo Contrast  06/11/2013   CLINICAL DATA:  GENERALIZED BODY ACHES FEVER  EXAM: CT HEAD WITHOUT CONTRAST  TECHNIQUE: Contiguous axial images were obtained from the base of the skull through the vertex without intravenous contrast  COMPARISON:  None available for comparison at time of study interpretation.  FINDINGS: The ventricles and sulci are normal. No intraparenchymal hemorrhage, mass effect  nor midline shift. No acute large vascular territory infarcts.  No abnormal extra-axial fluid collections. Basal cisterns are patent.  No skull fracture. The included ocular globes and orbital contents are non-suspicious. The mastoid aircells and included paranasal sinuses are well-aerated.  IMPRESSION: No acute intracranial process ; normal noncontrast CT of the head.   Electronically Signed   By: Elon Alas   On: 06/11/2013 23:44    EKG: Independently reviewed.   Assessment/Plan Principal Problem:   SIRS (systemic inflammatory response syndrome) Active Problems:   Fever and chills   1. Systemic Inflammatory Response Syndrome. Patient without significant past medical history presenting with fevers of 103, unclear source. Initial workup at [facility] was unremarkable. I discussed case with radiology for lumbar puncture under fluoroscopy. Will send CSF for culture, protein, glucose, cell count, PCR for HSV. He was given a dose of ceftriaxone 1 g IV as well as acyclovir at [facility]. His presenting symptoms would appear consistent with viral illness, flu swab coming back negative. I did not note rashes or nuchal rigidity on physical exam. Provide supportive care, IV fluid resuscitation, followup on MRI of brain, CSF studies and blood cultures. Will repeat CXR tomorrow  morning. Hold antimicrobial therapy until studies come back.      Code Status: Full Code Family Communication: Spoke with family members at bedside Disposition Plan: Anticipate he will require greater than 2 nights hospitalization  Time spent: 77 min  [facility] Pager [phone]

## 2013-06-12 NOTE — Procedures (Signed)
Fluoro guided LP performed at L3-L4 with 20-gauge 12 cm long needle.  Opening pressure 22 cmH2O.  9 mL clear CSF obtained.  No complications.  See full dictation in PACS.

## 2013-06-12 NOTE — ED Notes (Signed)
Patient stats were between 89-94 at best, patient resting left lateral recumbent. I placed patient on 2ltrs. o2 By nasal canula. Stats came up to 99-100 %, I notified nurse of same.

## 2013-06-12 NOTE — Progress Notes (Signed)
UR Completed Ostin Mathey Graves-Bigelow, RN,BSN [phone]  

## 2013-06-12 NOTE — ED Notes (Signed)
Blood cultures x2 sets 1st set Left antecubital 6 ml per bottle second set Left forearm with IV start 6 ml per bottle. Will begin rocephin at this time

## 2013-06-12 NOTE — ED Notes (Signed)
Lumbar tray a bedside pt instructed on procedure lumbar puncture and verbalized understanding

## 2013-06-12 NOTE — Progress Notes (Signed)
Labs were followed up. CSF fluid analysis unremarkable so far, with Gram Stain showing no organisms, CSF WBC count of 3, Total Protein of 27, Glucose 64. Fevers coming down over the day. Pending MRI of Brain. He was reassessed this afternoon, currently stable, afebrile. Patient reporting a recent trip to [place], [place], on May 28, 2013. I think Malaria is unlikely but will check a peripheral smear.

## 2013-06-12 NOTE — Care Management Note (Unsigned)
    Page 1 of 1   06/12/2013     3:19:33 PM CARE MANAGEMENT NOTE 06/12/2013  Patient:  Brian [name], Brian [name]   Account Number:  [id number]  Date Initiated:  06/12/2013  Documentation initiated by:  GRAVES-BIGELOW,Delisia Mcquiston  Subjective/Objective Assessment:   Pt admitted for fevers. Pt had been seen at Urgent Care and placed on ABX. S/p LP pending results.     Action/Plan:   CM will continue to monitor for disposition needs.   Anticipated DC Date:  06/14/2013   Anticipated DC Plan:  [place]  CM consult      Choice offered to / List presented to:             Status of service:  In process, will continue to follow Medicare Important Message given?   (If response is "NO", the following Medicare IM given date fields will be blank) Date Medicare IM given:   Date Additional Medicare IM given:    Discharge Disposition:  HOME/SELF CARE  Per UR Regulation:  Reviewed for med. necessity/level of care/duration of stay  If discussed at [place] of Stay Meetings, dates discussed:    Comments:

## 2013-06-12 NOTE — ED Notes (Signed)
Pt. C/o increase in joint pain. Requesting something further for pain. Will make MD aware.

## 2013-06-13 ENCOUNTER — Inpatient Hospital Stay (HOSPITAL_COMMUNITY): Payer: 59

## 2013-06-13 ENCOUNTER — Observation Stay (HOSPITAL_COMMUNITY): Payer: 59

## 2013-06-13 ENCOUNTER — Encounter (HOSPITAL_COMMUNITY): Payer: Self-pay | Admitting: Radiology

## 2013-06-13 DIAGNOSIS — R51 Headache: Secondary | ICD-10-CM

## 2013-06-13 DIAGNOSIS — B338 Other specified viral diseases: Secondary | ICD-10-CM

## 2013-06-13 DIAGNOSIS — M130 Polyarthritis, unspecified: Secondary | ICD-10-CM

## 2013-06-13 DIAGNOSIS — R079 Chest pain, unspecified: Secondary | ICD-10-CM

## 2013-06-13 DIAGNOSIS — D696 Thrombocytopenia, unspecified: Secondary | ICD-10-CM

## 2013-06-13 DIAGNOSIS — IMO0001 Reserved for inherently not codable concepts without codable children: Secondary | ICD-10-CM

## 2013-06-13 LAB — COMPREHENSIVE METABOLIC PANEL
ALBUMIN: 3.2 g/dL — AB (ref 3.5–5.2)
ALT: 32 U/L (ref 0–53)
AST: 21 U/L (ref 0–37)
Alkaline Phosphatase: 75 U/L (ref 39–117)
BUN: 10 mg/dL (ref 6–23)
CALCIUM: 8.3 mg/dL — AB (ref 8.4–10.5)
CHLORIDE: 101 meq/L (ref 96–112)
CO2: 24 mEq/L (ref 19–32)
CREATININE: 0.85 mg/dL (ref 0.50–1.35)
GFR calc Af Amer: >90 mL/min (ref >90)
Glucose, Bld: 99 mg/dL (ref 70–99)
Potassium: 3.9 mEq/L (ref 3.7–5.3)
Sodium: 136 mEq/L — ABNORMAL LOW (ref 137–147)
Total Bilirubin: 0.7 mg/dL (ref 0.3–1.2)
Total Protein: 6.1 g/dL (ref 6.0–8.3)

## 2013-06-13 LAB — SEDIMENTATION RATE: Sed Rate: 22 mm/hr — ABNORMAL HIGH (ref 0–16)

## 2013-06-13 LAB — CBC
HCT: 38 % — ABNORMAL LOW (ref 39.0–52.0)
HEMOGLOBIN: 13.4 g/dL (ref 13.0–17.0)
MCH: 30.6 pg (ref 26.0–34.0)
MCHC: 35.3 g/dL (ref 30.0–36.0)
MCV: 86.8 fL (ref 78.0–100.0)
Platelets: 127 10*3/uL — ABNORMAL LOW (ref 150–400)
RBC: 4.38 MIL/uL (ref 4.22–5.81)
RDW: 12.9 % (ref 11.5–15.5)
WBC: 6.2 10*3/uL (ref 4.0–10.5)

## 2013-06-13 MED ORDER — IOHEXOL 300 MG/ML  SOLN
75.0000 mL | Freq: Once | INTRAMUSCULAR | Status: AC | PRN
  Administered 2013-06-13: 75 mL via INTRAVENOUS

## 2013-06-13 MED ORDER — DOXYCYCLINE HYCLATE 100 MG IV SOLR
100.0000 mg | Freq: Two times a day (BID) | INTRAVENOUS | Status: DC
  Administered 2013-06-13 – 2013-06-14 (×3): 100 mg via INTRAVENOUS
  Filled 2013-06-13 (×4): qty 100

## 2013-06-13 NOTE — Consult Note (Signed)
[facility]    Date of Admission:  06/11/2013  Date of Consult:  06/13/2013  Reason for Consult: Fever of unknown origin Referring Physician: Dr Tana Coast   HPI: Brian [name] is an 38 y.o. male with smell history significant for childhood asthma cholecystectomy kidney stone discovered this March, who had been attending his daughter's soccer game this weekend when he developed acute onset of generalized weakness, malaise, severe myalgias, bilateral arthralgias, arthritic complaints in nearly every joint especially knees and wrists as well as severe pain along his lower back. He also developed high fevers to 103 headaches,chills. He also developed drenching night sweats which he is status recently as this morning. His [facility] where initial blood work and imaging was done including a chest x-ray which was negative a CT scan of the brain which was negative.  He was given a dose of ceftriaxone and acyclovir. After several times he ultimately underwent lumbar puncture which showed:  A white blood cell count of 3 with a glucose of 64 and protein of 27.  His RMSF fever IgG--which certainly should not be elevated a few days and infection was negative consistent with him not having RMSF in the past and making it highly susceptible to RMSF infection right now in other words this test does not in any way exclude the diagnosis of RMSF.  He also for some reason had titers done for Lyme though he has 0 evidence for Lyme infection and these titers were negative.  He was bitten by mosquitoes while having visited [place] early in April. His not metastatic by C. does have a dog who is largely an indoor dog. He is now developed a little bit of a faint rash on his flanks see pictures below.  He also developed worsening thrombocytopenia since admission. His fever curve seems slightly improved and his myalgias have improved as well but he still has severe back pain  He  had worsening of some throat discomfort after he laid flat for approximately 6 hours after his lumbar puncture and is now to CT scan of the neck done which is negative for abscess. He feels his tonsils are enlarged and has a new lesion on his tongue.    Past Medical History  Diagnosis Date  . Rickettsia infection   . Kidney stone     Past Surgical History  Procedure Laterality Date  . Cholecystectomy    ergies:   Allergies  Allergen Reactions  . Doxycycline Other (See Comments)    Reaction unknown  . Guaifenesin & Derivatives Other (See Comments)    Reaction unknown     Medications: I have reviewed patients current medications as documented in Epic Anti-infectives   Start     Dose/Rate Route Frequency Ordered Stop   06/13/13 1230  doxycycline (VIBRAMYCIN) 100 mg in dextrose 5 % 250 mL IVPB     100 mg 125 mL/hr over 120 Minutes Intravenous Every 12 hours 06/13/13 1150     06/12/13 0354  acyclovir (ZOVIRAX) 50 MG/ML injection    Comments:  Jeanie Sewer   : cabinet override      06/12/13 0354 06/12/13 0439   06/12/13 0342  cefTRIAXone (ROCEPHIN) 1 G injection    Comments:  Jeanie Sewer   : cabinet override      06/12/13 0342 06/12/13 0450   06/12/13 0330  acyclovir (ZOVIRAX) 1,000 mg in dextrose 5 % 150 mL IVPB     1,000 mg 170 mL/hr  over 60 Minutes Intravenous  Once 06/12/13 0319 06/12/13 0540   06/12/13 0300  cefTRIAXone (ROCEPHIN) 1 g in dextrose 5 % 50 mL IVPB     1 g 100 mL/hr over 30 Minutes Intravenous  Once 06/12/13 0250 06/12/13 0455      Social History:  reports that he has never smoked. He does not have any smokeless tobacco history on file. He reports that he does not drink alcohol or use illicit drugs.  Family History  Problem Relation Age of Onset  . Diabetes Mother   . Heart disease Mother   . Heart failure Mother   . Heart attack Mother   . Hyperlipidemia Mother   . Hypertension Mother   . Hyperlipidemia Father   . Sudden death Neg Hx    Mother  with Temporal arteritis As in HPI and primary teams notes otherwise 12 point review of systems is negative  Blood pressure 126/73, pulse 99, temperature 99.1 F (37.3 C), temperature source Oral, resp. rate 18, height _0  (1.803 m), weight 250 lb (113.4 kg), SpO2 95.00%. General: Alert and awake, oriented x3, not in any acute distress. HEENT: anicteric sclera, pupils reactive to light and accommodation, EOMI, oropharynx without exudate does have prominent tonsils he has a lesion on his tongue see below      CVS regular rate, normal r,  no murmur rubs or gallops Chest: clear to auscultation bilaterally, no wheezing, rales or rhonchi Abdomen: soft nontender, nondistended, normal bowel sounds, MSK: he has some tenderness about this in the joints in particular although not quite frank synovitis. Skin: see pictures  Faint partly blanching rash on flankls bilaterally        Neuro: nonfocal, strength and sensation intact   Results for orders placed during the hospital encounter of 06/11/13 (from the past 48 hour(s))  I-STAT CG4 LACTIC ACID, ED     Status: None   Collection Time    06/11/13 11:21 PM      Result Value Ref Range   Lactic Acid, Venous 1.38  0.5 - 2.2 mmol/L  CBC WITH DIFFERENTIAL     Status: Abnormal   Collection Time    06/11/13 11:29 PM      Result Value Ref Range   WBC 6.3  4.0 - 10.5 K/uL   RBC 5.08  4.22 - 5.81 MIL/uL   Hemoglobin 15.9  13.0 - 17.0 g/dL   HCT 44.3  39.0 - 52.0 %   MCV 87.2  78.0 - 100.0 fL   MCH 31.3  26.0 - 34.0 pg   MCHC 35.9  30.0 - 36.0 g/dL   RDW 13.2  11.5 - 15.5 %   Platelets 171  150 - 400 K/uL   Neutrophils Relative % 75  43 - 77 %   Neutro Abs 4.7  1.7 - 7.7 K/uL   Lymphocytes Relative 12  12 - 46 %   Lymphs Abs 0.8  0.7 - 4.0 K/uL   Monocytes Relative 13 (*) 3 - 12 %   Monocytes Absolute 0.8  0.1 - 1.0 K/uL   Eosinophils Relative 0  0 - 5 %   Eosinophils Absolute 0.0  0.0 - 0.7 K/uL   Basophils Relative 0  0 - 1 %    Basophils Absolute 0.0  0.0 - 0.1 K/uL  COMPREHENSIVE METABOLIC PANEL     Status: Abnormal   Collection Time    06/11/13 11:29 PM      Result Value Ref Range   Sodium 140  137 - 147 mEq/L   Potassium 3.9  3.7 - 5.3 mEq/L   Chloride 102  96 - 112 mEq/L   CO2 24  19 - 32 mEq/L   Glucose, Bld 113 (*) 70 - 99 mg/dL   BUN 20  6 - 23 mg/dL   Creatinine, Ser 1.10  0.50 - 1.35 mg/dL   Calcium 9.7  8.4 - 10.5 mg/dL   Total Protein 7.1  6.0 - 8.3 g/dL   Albumin 4.1  3.5 - 5.2 g/dL   AST 18  0 - 37 U/L   ALT 27  0 - 53 U/L   Alkaline Phosphatase 90  39 - 117 U/L   Total Bilirubin 0.6  0.3 - 1.2 mg/dL   GFR calc non Af Amer 84 (*) >90 mL/min   GFR calc Af Amer >90  >90 mL/min   Comment: (NOTE)     The eGFR has been calculated using the CKD EPI equation.     This calculation has not been validated in all clinical situations.     eGFR's persistently <90 mL/min signify possible Chronic Kidney     Disease.  SEDIMENTATION RATE     Status: None   Collection Time    06/11/13 11:29 PM      Result Value Ref Range   Sed Rate 3  0 - 16 mm/hr  RAPID STREP SCREEN     Status: None   Collection Time    06/11/13 11:33 PM      Result Value Ref Range   Streptococcus, Group A Screen (Direct) NEGATIVE  NEGATIVE   Comment: (NOTE)     A Rapid Antigen test may result negative if the antigen level in the     sample is below the detection level of this test. The FDA has not     cleared this test as a stand-alone test therefore the rapid antigen     negative result has reflexed to a Group A Strep culture.  CULTURE, GROUP A STREP     Status: None   Collection Time    06/11/13 11:33 PM      Result Value Ref Range   Specimen Description THROAT     Special Requests NONE     Culture       Value: Culture reincubated for better growth     Performed at [facility]   Report Status PENDING    CK TOTAL AND CKMB     Status: None   Collection Time    06/11/13 11:54 PM      Result Value Ref Range   Total  CK 69  7 - 232 U/L   CK, MB <1.0  0.3 - 4.0 ng/mL   Relative Index NOT CALCULATED  0.0 - 2.5   Comment: Performed at [facility]  TROPONIN I     Status: None   Collection Time    06/12/13 12:00 AM      Result Value Ref Range   Troponin I <0.30  <0.30 ng/mL   Comment:            Due to the release kinetics of cTnI,     a negative result within the first hours     of the onset of symptoms does not rule out     myocardial infarction with certainty.     If myocardial infarction is still suspected,     repeat the test at appropriate intervals.  URINALYSIS, ROUTINE W REFLEX MICROSCOPIC  Status: Abnormal   Collection Time    06/12/13  1:49 AM      Result Value Ref Range   Color, Urine YELLOW  YELLOW   APPearance CLEAR  CLEAR   Specific Gravity, Urine 1.035 (*) 1.005 - 1.030   pH 5.5  5.0 - 8.0   Glucose, UA NEGATIVE  NEGATIVE mg/dL   Hgb urine dipstick NEGATIVE  NEGATIVE   Bilirubin Urine NEGATIVE  NEGATIVE   Ketones, ur NEGATIVE  NEGATIVE mg/dL   Protein, ur NEGATIVE  NEGATIVE mg/dL   Urobilinogen, UA 0.2  0.0 - 1.0 mg/dL   Nitrite NEGATIVE  NEGATIVE   Leukocytes, UA NEGATIVE  NEGATIVE   Comment: MICROSCOPIC NOT DONE ON URINES WITH NEGATIVE PROTEIN, BLOOD, LEUKOCYTES, NITRITE, OR GLUCOSE <1000 mg/dL.  CULTURE, BLOOD (ROUTINE X 2)     Status: None   Collection Time    06/12/13  3:15 AM      Result Value Ref Range   Specimen Description BLOOD RIGHT ARM     Special Requests BOTTLES DRAWN AEROBIC AND ANAEROBIC 6ML EACH     Culture  Setup Time       Value: 06/12/2013 09:41     Performed at [facility]   Culture       Value:        BLOOD CULTURE RECEIVED NO GROWTH TO DATE CULTURE WILL BE HELD FOR 5 DAYS BEFORE ISSUING A FINAL NEGATIVE REPORT     Performed at [facility]   Report Status PENDING    CULTURE, BLOOD (ROUTINE X 2)     Status: None   Collection Time    06/12/13  3:20 AM      Result Value Ref Range   Specimen Description BLOOD RIGHT FOREARM      Special Requests BOTTLES DRAWN AEROBIC AND ANAEROBIC 6ML EACH     Culture  Setup Time       Value: 06/12/2013 09:41     Performed at [facility]   Culture       Value:        BLOOD CULTURE RECEIVED NO GROWTH TO DATE CULTURE WILL BE HELD FOR 5 DAYS BEFORE ISSUING A FINAL NEGATIVE REPORT     Performed at [facility]   Report Status PENDING    CBC     Status: Abnormal   Collection Time    06/12/13  9:50 AM      Result Value Ref Range   WBC 6.5  4.0 - 10.5 K/uL   RBC 4.48  4.22 - 5.81 MIL/uL   Hemoglobin 13.6  13.0 - 17.0 g/dL   HCT 39.2  39.0 - 52.0 %   MCV 87.5  78.0 - 100.0 fL   MCH 30.4  26.0 - 34.0 pg   MCHC 34.7  30.0 - 36.0 g/dL   RDW 13.2  11.5 - 15.5 %   Platelets 121 (*) 150 - 400 K/uL  CREATININE, SERUM     Status: None   Collection Time    06/12/13  9:50 AM      Result Value Ref Range   Creatinine, Ser 0.94  0.50 - 1.35 mg/dL   GFR calc non Af Amer >90  >90 mL/min   GFR calc Af Amer >90  >90 mL/min   Comment: (NOTE)     The eGFR has been calculated using the CKD EPI equation.     This calculation has not been validated in all clinical situations.  eGFR's persistently <90 mL/min signify possible Chronic Kidney     Disease.  TSH     Status: None   Collection Time    06/12/13  9:50 AM      Result Value Ref Range   TSH 1.070  0.350 - 4.500 uIU/mL   Comment: Please note change in reference range.  CSF CULTURE     Status: None   Collection Time    06/12/13 12:46 PM      Result Value Ref Range   Specimen Description CSF     Special Requests NONE     Gram Stain       Value: CYTOSPIN WBC PRESENT, PREDOMINANTLY MONONUCLEAR     NO ORGANISMS SEEN     Performed at [facility]     Performed at [facility]   Culture       Value: NO GROWTH 1 DAY     Performed at [facility]   Report Status PENDING    CSF CELL COUNT WITH DIFFERENTIAL     Status: Abnormal   Collection Time    06/12/13 12:46 PM      Result Value Ref  Range   Tube # 1     Color, CSF COLORLESS  COLORLESS   Appearance, CSF CLEAR  CLEAR   Supernatant NOT INDICATED     RBC Count, CSF 48 (*) 0 /cu mm   WBC, CSF 3  0 - 5 /cu mm   Segmented Neutrophils-CSF TOO FEW TO COUNT, SMEAR AVAILABLE FOR REVIEW  0 - 6 %   Lymphs, CSF RARE  40 - 80 %  PROTEIN AND GLUCOSE, CSF     Status: None   Collection Time    06/12/13 12:46 PM      Result Value Ref Range   Glucose, CSF 64  43 - 76 mg/dL   Total  Protein, CSF 27  15 - 45 mg/dL  GRAM STAIN     Status: None   Collection Time    06/12/13 12:46 PM      Result Value Ref Range   Specimen Description CSF     Special Requests NONE     Gram Stain       Value: WBC PRESENT, PREDOMINANTLY MONONUCLEAR     NO ORGANISMS SEEN     CYTOSPIN   Report Status 06/12/2013 FINAL    SAVE SMEAR     Status: None   Collection Time    06/12/13  4:30 PM      Result Value Ref Range   Smear Review SMEAR STAINED AND AVAILABLE FOR REVIEW    MALARIA SMEAR     Status: None   Collection Time    06/12/13  4:30 PM      Result Value Ref Range   Specimen Description BLOOD     Special Requests NONE     Malaria Prep       Value: Negative pending thick smear review     Performed at [facility]   Report Status PENDING    COMPREHENSIVE METABOLIC PANEL     Status: Abnormal   Collection Time    06/13/13  5:40 AM      Result Value Ref Range   Sodium 136 (*) 137 - 147 mEq/L   Potassium 3.9  3.7 - 5.3 mEq/L   Chloride 101  96 - 112 mEq/L   CO2 24  19 - 32 mEq/L   Glucose, Bld 99  70 - 99  mg/dL   BUN 10  6 - 23 mg/dL   Creatinine, Ser 0.85  0.50 - 1.35 mg/dL   Calcium 8.3 (*) 8.4 - 10.5 mg/dL   Total Protein 6.1  6.0 - 8.3 g/dL   Albumin 3.2 (*) 3.5 - 5.2 g/dL   AST 21  0 - 37 U/L   ALT 32  0 - 53 U/L   Alkaline Phosphatase 75  39 - 117 U/L   Total Bilirubin 0.7  0.3 - 1.2 mg/dL   GFR calc non Af Amer >90  >90 mL/min   GFR calc Af Amer >90  >90 mL/min   Comment: (NOTE)     The eGFR has been calculated using  the CKD EPI equation.     This calculation has not been validated in all clinical situations.     eGFR's persistently <90 mL/min signify possible Chronic Kidney     Disease.  CBC     Status: Abnormal   Collection Time    06/13/13  5:40 AM      Result Value Ref Range   WBC 6.2  4.0 - 10.5 K/uL   RBC 4.38  4.22 - 5.81 MIL/uL   Hemoglobin 13.4  13.0 - 17.0 g/dL   HCT 38.0 (*) 39.0 - 52.0 %   MCV 86.8  78.0 - 100.0 fL   MCH 30.6  26.0 - 34.0 pg   MCHC 35.3  30.0 - 36.0 g/dL   RDW 12.9  11.5 - 15.5 %   Platelets 127 (*) 150 - 400 K/uL      Component Value Date/Time   SDES BLOOD 06/12/2013 1630   SPECREQUEST NONE 06/12/2013 1630   CULT  Value: NO GROWTH 1 DAY Performed at [facility] 06/12/2013 1246   REPTSTATUS PENDING 06/12/2013 1630   Dg Chest 2 View  06/11/2013   CLINICAL DATA:  Generalized body aches and fever.  EXAM: CHEST  2 VIEW  COMPARISON:  CT ABD/PELV WO CM dated 04/24/2013  FINDINGS: Cardiomediastinal silhouette is unremarkable. Strandy densities in left lung base. No pleural effusions. No pneumothorax. Azygos fissure.  Multiple EKG lines overlie the patient and may obscure subtle underlying pathology. Soft tissue planes and included osseous structures are nonsuspicious. Mild degenerative change of the thoracic spine.  IMPRESSION: Strandy densities in left lung base likely reflect atelectasis.   Electronically Signed   By: Elon Alas   On: 06/11/2013 23:38   Ct Head Wo Contrast  06/11/2013   CLINICAL DATA:  GENERALIZED BODY ACHES FEVER  EXAM: CT HEAD WITHOUT CONTRAST  TECHNIQUE: Contiguous axial images were obtained from the base of the skull through the vertex without intravenous contrast  COMPARISON:  None available for comparison at time of study interpretation.  FINDINGS: The ventricles and sulci are normal. No intraparenchymal hemorrhage, mass effect nor midline shift. No acute large vascular territory infarcts.  No abnormal extra-axial fluid collections. Basal  cisterns are patent.  No skull fracture. The included ocular globes and orbital contents are non-suspicious. The mastoid aircells and included paranasal sinuses are well-aerated.  IMPRESSION: No acute intracranial process ; normal noncontrast CT of the head.   Electronically Signed   By: Elon Alas   On: 06/11/2013 23:44   Ct Soft Tissue Neck W Contrast  06/13/2013   CLINICAL DATA:  Bilateral neck swelling, fever of unknown origin  EXAM: CT NECK WITH CONTRAST  TECHNIQUE: Multidetector CT imaging of the neck was performed using the standard protocol following the bolus administration of intravenous  contrast.  CONTRAST:  55m OMNIPAQUE IOHEXOL 300 MG/ML  SOLN  COMPARISON:  None.  FINDINGS: Pharyngeal soft tissues are patent.  No evidence of retropharyngeal abscess.  No evidence of odontogenic abscess.  No suspicious cervical lymphadenopathy.  The visualized paranasal sinuses are essentially clear. The mastoid air cells are unopacified.  Visualized thyroid is unremarkable.  Cervical spine is notable for mild degenerative changes.  Visualized lung apices are clear.  IMPRESSION: Negative CT neck.   Electronically Signed   By: SJulian HyM.D.   On: 06/13/2013 11:19   Mr Brain Wo Contrast  06/13/2013   CLINICAL DATA:  Headaches, body aches and history of fever. History of prior Rickettsia infection. Post lumbar puncture. Currently afebrile.  EXAM: MRI HEAD WITHOUT CONTRAST  TECHNIQUE: Multiplanar, multiecho pulse sequences of the brain and surrounding structures were obtained without intravenous contrast.  COMPARISON:  06/11/2013 CT.  No comparison MR.  FINDINGS: On diffusion sequence, there is a tiny area altered signal intensity within the superior left vermis which does not demonstrate restricted motion on the ADC map as may be expected if this represented an acute infarct or small abscess. This appears to represent T2 shine through. This region of slightly altered signal intensity involving the  superior left vermis may reflect result of remote insult (possibly sequelae of patient's prior Rickettsia infection) or congenital abnormality. If there are progressive symptoms, followup MR imaging will contrast with attention to this region recommended to exclude acute infection or mass at this level.  No intracranial hemorrhage.  No hydrocephalus.  Ectatic cavernous segment of the internal carotid artery. Major intracranial vascular structures are patent.  Cerebellar tonsils minimally low lying but within the range of normal limits.  Probable small parotid lymph node.  IMPRESSION: No definitive findings of acute infarct or acute infection however, given the subtle altered signal intensity involving the superior vermis, followup imaging with contrast may be considered if the patient had progressive symptoms. Please see above discussion.   Electronically Signed   By: SChauncey CruelM.D.   On: 06/13/2013 09:34   UKoreaAbdomen Complete  06/12/2013   CLINICAL DATA:  Abdominal pain and fever.  EXAM: ULTRASOUND ABDOMEN COMPLETE  COMPARISON:  None.  FINDINGS: Gallbladder:  Prior cholecystectomy.  Common bile duct:  Diameter: 6.7 mm.  Liver:  No focal lesion identified. Within normal limits in parenchymal echogenicity.  IVC:  No abnormality visualized.  Pancreas:  Visualized portion unremarkable.  Spleen:  Size and appearance within normal limits.  Right Kidney:  Length: 12.8 cm. Echogenicity within normal limits. No mass or hydronephrosis visualized.  Left Kidney:  Length: 13.6 cm. Echogenicity within normal limits. No mass or hydronephrosis visualized.  Abdominal aorta:  No aneurysm visualized.  Maximum AP dimension is 2.3 cm.  Other findings:  None.  IMPRESSION: 1. No acute findings. 2. Prior cholecystectomy.   Electronically Signed   By: TKerby MoorsM.D.   On: 06/12/2013 09:24   Dg Fluoro Guide Lumbar Puncture  06/12/2013   CLINICAL DATA:  Fever and headache.  EXAM: DIAGNOSTIC LUMBAR PUNCTURE UNDER FLUOROSCOPIC  GUIDANCE  FLUOROSCOPY TIME:  1 min 11 seconds.  PROCEDURE: Informed consent was obtained from the patient prior to the procedure, including potential complications of headache, allergy, and pain. With the patient prone, the lower back was prepped with Betadine. 1% Lidocaine was used for local anesthesia. Lumbar puncture was performed at the L3-L4 level using a gauge needle with return of clear CSF with an opening pressure of 22 cm  water. 10 ml of CSF were obtained for laboratory studies. The patient tolerated the procedure well and there were no apparent complications.  IMPRESSION: 1. Successful uncomplicated fluoroscopic guided lumbar puncture, as above.   Electronically Signed   By: [name] M.D.   On: 06/12/2013 13:26     Recent Results (from the past 720 hour(s))  RAPID STREP SCREEN     Status: None   Collection Time    06/11/13 11:33 PM      Result Value Ref Range Status   Streptococcus, Group A Screen (Direct) NEGATIVE  NEGATIVE Final   Comment: (NOTE)     A Rapid Antigen test may result negative if the antigen level in the     sample is below the detection level of this test. The FDA has not     cleared this test as a stand-alone test therefore the rapid antigen     negative result has reflexed to a Group A Strep culture.  CULTURE, GROUP A STREP     Status: None   Collection Time    06/11/13 11:33 PM      Result Value Ref Range Status   Specimen Description THROAT   Final   Special Requests NONE   Final   Culture     Final   Value: Culture reincubated for better growth     Performed at [facility]   Report Status PENDING   Incomplete  CULTURE, BLOOD (ROUTINE X 2)     Status: None   Collection Time    06/12/13  3:15 AM      Result Value Ref Range Status   Specimen Description BLOOD RIGHT ARM   Final   Special Requests BOTTLES DRAWN AEROBIC AND ANAEROBIC 6ML EACH   Final   Culture  Setup Time     Final   Value: 06/12/2013 09:41     Performed at [facility]   Culture     Final   Value:        BLOOD CULTURE RECEIVED NO GROWTH TO DATE CULTURE WILL BE HELD FOR 5 DAYS BEFORE ISSUING A FINAL NEGATIVE REPORT     Performed at [facility]   Report Status PENDING   Incomplete  CULTURE, BLOOD (ROUTINE X 2)     Status: None   Collection Time    06/12/13  3:20 AM      Result Value Ref Range Status   Specimen Description BLOOD RIGHT FOREARM   Final   Special Requests BOTTLES DRAWN AEROBIC AND ANAEROBIC 6ML EACH   Final   Culture  Setup Time     Final   Value: 06/12/2013 09:41     Performed at [facility]   Culture     Final   Value:        BLOOD CULTURE RECEIVED NO GROWTH TO DATE CULTURE WILL BE HELD FOR 5 DAYS BEFORE ISSUING A FINAL NEGATIVE REPORT     Performed at [facility]   Report Status PENDING   Incomplete  CSF CULTURE     Status: None   Collection Time    06/12/13 12:46 PM      Result Value Ref Range Status   Specimen Description CSF   Final   Special Requests NONE   Final   Gram Stain     Final   Value: CYTOSPIN WBC PRESENT, PREDOMINANTLY MONONUCLEAR     NO ORGANISMS SEEN     Performed at [facility]     Performed at [facility]   Culture     Final   Value: NO GROWTH 1 DAY     Performed at [facility]   Report Status PENDING   Incomplete  GRAM STAIN     Status: None   Collection Time    06/12/13 12:46 PM      Result Value Ref Range Status   Specimen Description CSF   Final   Special Requests NONE   Final   Gram Stain     Final   Value: WBC PRESENT, PREDOMINANTLY MONONUCLEAR     NO ORGANISMS SEEN     CYTOSPIN   Report Status 06/12/2013 FINAL   Final  MALARIA SMEAR     Status: None   Collection Time    06/12/13  4:30 PM      Result Value Ref Range Status   Specimen Description BLOOD   Final   Special Requests NONE   Final   Malaria Prep     Final   Value: Negative pending thick smear review     Performed at [facility]   Report Status PENDING    Incomplete     Impression/Recommendation  Principal Problem:   Fever of unknown origin (FUO) Active Problems:   SIRS (systemic inflammatory response syndrome)   Brian [name] is a 38 y.o. male with  the onset of fevers diffuse myalgias headaches drenching sweats   #1 fever of unknown origin: Certainly we do need to consider infection with tickborne pathogens such as [facility] spotted fever and Ehrlichia. Neither can be excluded or diagnosed based on serologies (though we can check acute titers for comparison for convalsecent when they would turn positive)  He deserves a diagnostic and therapeutic trial with Doxy (Which he claims caused a heart arrythmia that he noted on moniotr but not formally dx and dyspepsia), with IV doxycyline for starters to ensure he does NOT in fact such an issue with arrythmia and to minimize GI upset  I will HIV antibody a hepatitis panel Epstein-Barr and cytomegalovirus antibodies  With his recent travel to [place] we did he think about Chinkungunya virus as well as dengue although, I would be more skeptical of latter. We can check echo virus antibodies and potentially will serologies for parvovirus in the morning  I will checks and a sedimentation rate C-reactive protein ANA rheumatoid factor today.  His fevers do not defervesce on doxycycline and laboratory work is unrevealing we'll do other additional tests, and do a complete fever unknown origin panel of tests including expanding testing to a serum protein electrophoresis cryoglobulins and expanding his imaging with repeat CT of the chest abdomen and pelvis. I'm hoping you'll improve on doxycycline for possible tick borne infection or that he'll simply improve to this being a viral infection that will run its course.  I spent greater than 60 minutes with the patient including greater than 50% of time in face to face counsel of the patient and in coordination of their care.    06/13/2013, 1:06  PM   Thank you so much for this interesting consult  [place] for [place] [phone] (pager) [phone] (office) 06/13/2013, 1:06 PM  [place] 06/13/2013, 1:06 PM

## 2013-06-13 NOTE — Progress Notes (Signed)
Patient ID: Brian [name]  male  OZD:[id number]    DOB: 06-15-1975    DOA: 06/11/2013  PCP: Stefanie Libel, MD  Assessment/Plan: Principal Problem:   Fever of unknown origin (FUO) with SIRS - CSF fluid analysis unremarkable so far, Gram stain showed no organism,CSF WBC count of 3, Total Protein of 27, Glucose 64. HSV in process - Malaria prep negative but pending thick smear review  - Influenza panel in process, rapid strep screen negative, UA negative - RMSF IgG negative, blood cultures pending -Discussed with infectious disease, Dr. Drucilla Schmidt will see patient for further recommendations  - Also patient has slightly enlarged erythematous tonsils, ordered CT of the soft tissue neck, no tonsillitis or tonsillar abscess seen.  DVT Prophylaxis:  Code Status:Full code  Family Communication:Discussed in detail with patient and his family members in the room  Disposition:  Consultants:  Infectious disease, Dr. Drucilla Schmidt  Procedures:  Lumbar puncture 06/12/13  Antibiotics:  IV doxycycline 4/28  Subjective: She seen and examined, feels somewhat better from yesterday, headache is improving, no nuchal rigidity, no rashes  Objective: Weight change: 0.001 kg (0 oz) No intake or output data in the 24 hours ending 06/13/13 1245 Blood pressure 126/73, pulse 99, temperature 99.1 F (37.3 C), temperature source Oral, resp. rate 18, height 5\' 11"  (1.803 m), weight 113.4 kg (250 lb), SpO2 95.00%.  Physical Exam: General: Alert and awake, oriented x3, not in any acute distress. CVS: S1-S2 clear, no murmur rubs or gallops Chest: clear to auscultation bilaterally, no wheezing, rales or rhonchi Abdomen: soft nontender, nondistended, normal bowel sounds  Extremities: no cyanosis, clubbing or edema noted bilaterally Neuro: Cranial nerves II-XII intact, no focal neurological deficits  Lab Results: Basic Metabolic Panel:  Recent Labs Lab 06/11/13 2329 06/12/13 0950 06/13/13 0540  NA 140  --   136*  K 3.9  --  3.9  CL 102  --  101  CO2 24  --  24  GLUCOSE 113*  --  99  BUN 20  --  10  CREATININE 1.10 0.94 0.85  CALCIUM 9.7  --  8.3*   Liver Function Tests:  Recent Labs Lab 06/11/13 2329 06/13/13 0540  AST 18 21  ALT 27 32  ALKPHOS 90 75  BILITOT 0.6 0.7  PROT 7.1 6.1  ALBUMIN 4.1 3.2*   No results found for this basename: LIPASE, AMYLASE,  in the last 168 hours No results found for this basename: AMMONIA,  in the last 168 hours CBC:  Recent Labs Lab 06/11/13 2329 06/12/13 0950 06/13/13 0540  WBC 6.3 6.5 6.2  NEUTROABS 4.7  --   --   HGB 15.9 13.6 13.4  HCT 44.3 39.2 38.0*  MCV 87.2 87.5 86.8  PLT 171 121* 127*   Cardiac Enzymes:  Recent Labs Lab 06/11/13 2354 06/12/13  CKTOTAL 69  --   CKMB <1.0  --   TROPONINI  --  <0.30   BNP: No components found with this basename: POCBNP,  CBG: No results found for this basename: GLUCAP,  in the last 168 hours   Micro Results: Recent Results (from the past 240 hour(s))  RAPID STREP SCREEN     Status: None   Collection Time    06/11/13 11:33 PM      Result Value Ref Range Status   Streptococcus, Group A Screen (Direct) NEGATIVE  NEGATIVE Final   Comment: (NOTE)     A Rapid Antigen test may result negative if the antigen level in the  sample is below the detection level of this test. The FDA has not     cleared this test as a stand-alone test therefore the rapid antigen     negative result has reflexed to a Group A Strep culture.  CULTURE, GROUP A STREP     Status: None   Collection Time    06/11/13 11:33 PM      Result Value Ref Range Status   Specimen Description THROAT   Final   Special Requests NONE   Final   Culture     Final   Value: Culture reincubated for better growth     Performed at [facility]   Report Status PENDING   Incomplete  CULTURE, BLOOD (ROUTINE X 2)     Status: None   Collection Time    06/12/13  3:15 AM      Result Value Ref Range Status   Specimen  Description BLOOD RIGHT ARM   Final   Special Requests BOTTLES DRAWN AEROBIC AND ANAEROBIC 6ML EACH   Final   Culture  Setup Time     Final   Value: 06/12/2013 09:41     Performed at [facility]   Culture     Final   Value:        BLOOD CULTURE RECEIVED NO GROWTH TO DATE CULTURE WILL BE HELD FOR 5 DAYS BEFORE ISSUING A FINAL NEGATIVE REPORT     Performed at [facility]   Report Status PENDING   Incomplete  CULTURE, BLOOD (ROUTINE X 2)     Status: None   Collection Time    06/12/13  3:20 AM      Result Value Ref Range Status   Specimen Description BLOOD RIGHT FOREARM   Final   Special Requests BOTTLES DRAWN AEROBIC AND ANAEROBIC 6ML EACH   Final   Culture  Setup Time     Final   Value: 06/12/2013 09:41     Performed at [facility]   Culture     Final   Value:        BLOOD CULTURE RECEIVED NO GROWTH TO DATE CULTURE WILL BE HELD FOR 5 DAYS BEFORE ISSUING A FINAL NEGATIVE REPORT     Performed at [facility]   Report Status PENDING   Incomplete  CSF CULTURE     Status: None   Collection Time    06/12/13 12:46 PM      Result Value Ref Range Status   Specimen Description CSF   Final   Special Requests NONE   Final   Gram Stain     Final   Value: CYTOSPIN WBC PRESENT, PREDOMINANTLY MONONUCLEAR     NO ORGANISMS SEEN     Performed at [facility]     Performed at [facility]   Culture     Final   Value: NO GROWTH 1 DAY     Performed at [facility]   Report Status PENDING   Incomplete  GRAM STAIN     Status: None   Collection Time    06/12/13 12:46 PM      Result Value Ref Range Status   Specimen Description CSF   Final   Special Requests NONE   Final   Gram Stain     Final   Value: WBC PRESENT, PREDOMINANTLY MONONUCLEAR     NO ORGANISMS SEEN     CYTOSPIN   Report Status 06/12/2013 FINAL   Final  MALARIA  SMEAR     Status: None   Collection Time    06/12/13  4:30 PM      Result Value Ref Range Status   Specimen  Description BLOOD   Final   Special Requests NONE   Final   Malaria Prep     Final   Value: Negative pending thick smear review     Performed at [facility]   Report Status PENDING   Incomplete    Studies/Results: Dg Chest 2 View  06/11/2013   CLINICAL DATA:  Generalized body aches and fever.  EXAM: CHEST  2 VIEW  COMPARISON:  CT ABD/PELV WO CM dated 04/24/2013  FINDINGS: Cardiomediastinal silhouette is unremarkable. Strandy densities in left lung base. No pleural effusions. No pneumothorax. Azygos fissure.  Multiple EKG lines overlie the patient and may obscure subtle underlying pathology. Soft tissue planes and included osseous structures are nonsuspicious. Mild degenerative change of the thoracic spine.  IMPRESSION: Strandy densities in left lung base likely reflect atelectasis.   Electronically Signed   By: Elon Alas   On: 06/11/2013 23:38   Ct Head Wo Contrast  06/11/2013   CLINICAL DATA:  GENERALIZED BODY ACHES FEVER  EXAM: CT HEAD WITHOUT CONTRAST  TECHNIQUE: Contiguous axial images were obtained from the base of the skull through the vertex without intravenous contrast  COMPARISON:  None available for comparison at time of study interpretation.  FINDINGS: The ventricles and sulci are normal. No intraparenchymal hemorrhage, mass effect nor midline shift. No acute large vascular territory infarcts.  No abnormal extra-axial fluid collections. Basal cisterns are patent.  No skull fracture. The included ocular globes and orbital contents are non-suspicious. The mastoid aircells and included paranasal sinuses are well-aerated.  IMPRESSION: No acute intracranial process ; normal noncontrast CT of the head.   Electronically Signed   By: Elon Alas   On: 06/11/2013 23:44   Ct Soft Tissue Neck W Contrast  06/13/2013   CLINICAL DATA:  Bilateral neck swelling, fever of unknown origin  EXAM: CT NECK WITH CONTRAST  TECHNIQUE: Multidetector CT imaging of the neck was performed using  the standard protocol following the bolus administration of intravenous contrast.  CONTRAST:  64mL OMNIPAQUE IOHEXOL 300 MG/ML  SOLN  COMPARISON:  None.  FINDINGS: Pharyngeal soft tissues are patent.  No evidence of retropharyngeal abscess.  No evidence of odontogenic abscess.  No suspicious cervical lymphadenopathy.  The visualized paranasal sinuses are essentially clear. The mastoid air cells are unopacified.  Visualized thyroid is unremarkable.  Cervical spine is notable for mild degenerative changes.  Visualized lung apices are clear.  IMPRESSION: Negative CT neck.   Electronically Signed   By: Julian Hy M.D.   On: 06/13/2013 11:19   Mr Brain Wo Contrast  06/13/2013   CLINICAL DATA:  Headaches, body aches and history of fever. History of prior Rickettsia infection. Post lumbar puncture. Currently afebrile.  EXAM: MRI HEAD WITHOUT CONTRAST  TECHNIQUE: Multiplanar, multiecho pulse sequences of the brain and surrounding structures were obtained without intravenous contrast.  COMPARISON:  06/11/2013 CT.  No comparison MR.  FINDINGS: On diffusion sequence, there is a tiny area altered signal intensity within the superior left vermis which does not demonstrate restricted motion on the ADC map as may be expected if this represented an acute infarct or small abscess. This appears to represent T2 shine through. This region of slightly altered signal intensity involving the superior left vermis may reflect result of remote insult (possibly sequelae of patient's prior  Rickettsia infection) or congenital abnormality. If there are progressive symptoms, followup MR imaging will contrast with attention to this region recommended to exclude acute infection or mass at this level.  No intracranial hemorrhage.  No hydrocephalus.  Ectatic cavernous segment of the internal carotid artery. Major intracranial vascular structures are patent.  Cerebellar tonsils minimally low lying but within the range of normal limits.   Probable small parotid lymph node.  IMPRESSION: No definitive findings of acute infarct or acute infection however, given the subtle altered signal intensity involving the superior vermis, followup imaging with contrast may be considered if the patient had progressive symptoms. Please see above discussion.   Electronically Signed   By: Chauncey Cruel M.D.   On: 06/13/2013 09:34   US Abdomen Complete  06/12/2013   CLINICAL DATA:  Abdominal pain and fever.  EXAM: ULTRASOUND ABDOMEN COMPLETE  COMPARISON:  None.  FINDINGS: Gallbladder:  Prior cholecystectomy.  Common bile duct:  Diameter: 6.7 mm.  Liver:  No focal lesion identified. Within normal limits in parenchymal echogenicity.  IVC:  No abnormality visualized.  Pancreas:  Visualized portion unremarkable.  Spleen:  Size and appearance within normal limits.  Right Kidney:  Length: 12.8 cm. Echogenicity within normal limits. No mass or hydronephrosis visualized.  Left Kidney:  Length: 13.6 cm. Echogenicity within normal limits. No mass or hydronephrosis visualized.  Abdominal aorta:  No aneurysm visualized.  Maximum AP dimension is 2.3 cm.  Other findings:  None.  IMPRESSION: 1. No acute findings. 2. Prior cholecystectomy.   Electronically Signed   By: Kerby Moors M.D.   On: 06/12/2013 09:24   Dg Fluoro Guide Lumbar Puncture  06/12/2013   CLINICAL DATA:  Fever and headache.  EXAM: DIAGNOSTIC LUMBAR PUNCTURE UNDER FLUOROSCOPIC GUIDANCE  FLUOROSCOPY TIME:  1 min 11 seconds.  PROCEDURE: Informed consent was obtained from the patient prior to the procedure, including potential complications of headache, allergy, and pain. With the patient prone, the lower back was prepped with Betadine. 1% Lidocaine was used for local anesthesia. Lumbar puncture was performed at the L3-L4 level using a gauge needle with return of clear CSF with an opening pressure of 22 cm water. 10 ml of CSF were obtained for laboratory studies. The patient tolerated the procedure well and there  were no apparent complications.  IMPRESSION: 1. Successful uncomplicated fluoroscopic guided lumbar puncture, as above.   Electronically Signed   By: [name] M.D.   On: 06/12/2013 13:26    Medications: Scheduled Meds: . buPROPion  300 mg Oral Daily  . doxycycline (VIBRAMYCIN) IV  100 mg Intravenous Q12H  . enoxaparin (LOVENOX) injection  40 mg Subcutaneous Q24H  . sodium chloride  3 mL Intravenous Q12H      LOS: 2 days   Marriah Sanderlin Krystal Eaton M.D. [facility] 06/13/2013, 12:45 PM Pager: [phone]  If 7PM-7AM, please contact night-coverage [email or web address] Password [email or web address]  **Disclaimer: This note was dictated with voice recognition software. Similar sounding words can inadvertently be transcribed and this note may contain transcription errors which may not have been corrected upon publication of note.**

## 2013-06-14 LAB — CBC WITH DIFFERENTIAL/PLATELET
Basophils Absolute: 0 10*3/uL (ref 0.0–0.1)
Basophils Relative: 0 % (ref 0–1)
Eosinophils Absolute: 0 10*3/uL (ref 0.0–0.7)
Eosinophils Relative: 0 % (ref 0–5)
HEMATOCRIT: 40.1 % (ref 39.0–52.0)
Hemoglobin: 13.9 g/dL (ref 13.0–17.0)
LYMPHS ABS: 0.8 10*3/uL (ref 0.7–4.0)
LYMPHS PCT: 17 % (ref 12–46)
MCH: 30.4 pg (ref 26.0–34.0)
MCHC: 34.7 g/dL (ref 30.0–36.0)
MCV: 87.7 fL (ref 78.0–100.0)
MONO ABS: 0.5 10*3/uL (ref 0.1–1.0)
MONOS PCT: 12 % (ref 3–12)
Neutro Abs: 3.3 10*3/uL (ref 1.7–7.7)
Neutrophils Relative %: 71 % (ref 43–77)
Platelets: 130 10*3/uL — ABNORMAL LOW (ref 150–400)
RBC: 4.57 MIL/uL (ref 4.22–5.81)
RDW: 13 % (ref 11.5–15.5)
WBC: 4.6 10*3/uL (ref 4.0–10.5)

## 2013-06-14 LAB — COMPREHENSIVE METABOLIC PANEL
ALT: 28 U/L (ref 0–53)
AST: 17 U/L (ref 0–37)
Albumin: 3.2 g/dL — ABNORMAL LOW (ref 3.5–5.2)
Alkaline Phosphatase: 79 U/L (ref 39–117)
BILIRUBIN TOTAL: 0.5 mg/dL (ref 0.3–1.2)
BUN: 11 mg/dL (ref 6–23)
CHLORIDE: 101 meq/L (ref 96–112)
CO2: 24 meq/L (ref 19–32)
CREATININE: 0.94 mg/dL (ref 0.50–1.35)
Calcium: 9 mg/dL (ref 8.4–10.5)
GFR calc Af Amer: >90 mL/min (ref >90)
Glucose, Bld: 99 mg/dL (ref 70–99)
Potassium: 4 mEq/L (ref 3.7–5.3)
Sodium: 137 mEq/L (ref 137–147)
Total Protein: 6.5 g/dL (ref 6.0–8.3)

## 2013-06-14 LAB — C-REACTIVE PROTEIN: CRP: 18.4 mg/dL — ABNORMAL HIGH (ref <0.60)

## 2013-06-14 LAB — HERPES SIMPLEX VIRUS(HSV) DNA BY PCR
HSV 1 DNA: NOT DETECTED
HSV 2 DNA: NOT DETECTED

## 2013-06-14 LAB — EPSTEIN-BARR VIRUS VCA ANTIBODY PANEL
EBV EA IgG: 13.3 U/mL — ABNORMAL HIGH (ref <9.0)
EBV NA IgG: 28.8 U/mL — ABNORMAL HIGH (ref <18.0)
EBV VCA IgG: 179 U/mL — ABNORMAL HIGH (ref <18.0)

## 2013-06-14 LAB — HEPATITIS PANEL, ACUTE
HCV Ab: NEGATIVE
HEP A IGM: NONREACTIVE
Hep B C IgM: NONREACTIVE
Hepatitis B Surface Ag: NEGATIVE

## 2013-06-14 LAB — CULTURE, GROUP A STREP

## 2013-06-14 LAB — HIV ANTIBODY (ROUTINE TESTING W REFLEX): HIV: NONREACTIVE

## 2013-06-14 LAB — RHEUMATOID FACTOR: Rhuematoid fact SerPl-aCnc: <10 IU/mL (ref <=14)

## 2013-06-14 LAB — ANA: ANA: NEGATIVE

## 2013-06-14 MED ORDER — DOXYCYCLINE HYCLATE 100 MG PO TABS
100.0000 mg | ORAL_TABLET | Freq: Two times a day (BID) | ORAL | Status: DC
  Administered 2013-06-14: 100 mg via ORAL
  Filled 2013-06-14 (×3): qty 1

## 2013-06-14 MED ORDER — MAGIC MOUTHWASH W/LIDOCAINE
15.0000 mL | Freq: Three times a day (TID) | ORAL | Status: DC | PRN
  Administered 2013-06-14 – 2013-06-17 (×4): 15 mL via ORAL
  Filled 2013-06-14 (×4): qty 15

## 2013-06-14 MED ORDER — SODIUM CHLORIDE 0.9 % IV SOLN
INTRAVENOUS | Status: DC
  Administered 2013-06-15 – 2013-06-17 (×5): via INTRAVENOUS

## 2013-06-14 NOTE — Progress Notes (Signed)
[facility]  Day #2 doxycycline  Subjective: Still complaining of pain in his throat from his enlarged tonsils and pain with swallowing.   Antibiotics:  Anti-infectives   Start     Dose/Rate Route Frequency Ordered Stop   06/14/13 2200  doxycycline (VIBRA-TABS) tablet 100 mg     100 mg Oral Every 12 hours 06/14/13 1546     06/13/13 1230  doxycycline (VIBRAMYCIN) 100 mg in dextrose 5 % 250 mL IVPB  Status:  Discontinued     100 mg 125 mL/hr over 120 Minutes Intravenous Every 12 hours 06/13/13 1150 06/14/13 1546   06/12/13 0354  acyclovir (ZOVIRAX) 50 MG/ML injection    Comments:  Jeanie Sewer   : cabinet override      06/12/13 0354 06/12/13 0439   06/12/13 0342  cefTRIAXone (ROCEPHIN) 1 G injection    Comments:  Jeanie Sewer   : cabinet override      06/12/13 0342 06/12/13 0450   06/12/13 0330  acyclovir (ZOVIRAX) 1,000 mg in dextrose 5 % 150 mL IVPB     1,000 mg 170 mL/hr over 60 Minutes Intravenous  Once 06/12/13 0319 06/12/13 0540   06/12/13 0300  cefTRIAXone (ROCEPHIN) 1 g in dextrose 5 % 50 mL IVPB     1 g 100 mL/hr over 30 Minutes Intravenous  Once 06/12/13 0250 06/12/13 0455      Medications: Scheduled Meds: . buPROPion  300 mg Oral Daily  . doxycycline  100 mg Oral Q12H  . enoxaparin (LOVENOX) injection  40 mg Subcutaneous Q24H  . sodium chloride  3 mL Intravenous Q12H   Continuous Infusions: . sodium chloride 75 mL/hr at 06/14/13 1415   PRN Meds:.acetaminophen, acetaminophen, alum & mag hydroxide-simeth, morphine injection, ondansetron (ZOFRAN) IV, ondansetron, oxyCODONE, sorbitol    Objective: Weight change:   Intake/Output Summary (Last 24 hours) at 06/14/13 1547 Last data filed at 06/13/13 1700  Gross per 24 hour  Intake    360 ml  Output      0 ml  Net    360 ml   Blood pressure 132/82, pulse 93, temperature 99.2 F (37.3 C), temperature source Oral, resp. rate 18, height 5\' 11"  (1.803 m), weight 250 lb (113.4 kg),  SpO2 99.00%. Temp:  [98.7 F (37.1 C)-101.4 F (38.6 C)] 99.2 F (37.3 C) (04/29 1138) Pulse Rate:  [93-97] 93 (04/29 0430) Resp:  [18] 18 (04/29 0430) BP: (126-132)/(82-91) 132/82 mmHg (04/29 0430) SpO2:  [99 %-100 %] 99 % (04/29 0430)  Physical Exam: General: Alert and awake, oriented x3, not in any acute distress.  HEENT: anicteric sclera, pupils reactive to light and accommodation, EOMI, oropharynx without exudate does have prominent tonsils he has a lesion on his tongue see below       CVS regular rate, normal r, no murmur rubs or gallops  Chest: clear to auscultation bilaterally, no wheezing, rales or rhonchi  Abdomen: soft nontender, nondistended, normal bowel sounds,  MSK: he has some tenderness about this in the joints in particular although not quite frank synovitis.  Skin: see pictures  Faint partly blanching rash on flanks bilaterally        CBC:  Recent Labs Lab 06/11/13 2329 06/12/13 0950 06/13/13 0540 06/14/13 0418  HGB 15.9 13.6 13.4 13.9  HCT 44.3 39.2 38.0* 40.1  PLT 171 121* 127* 130*     BMET  Recent Labs  06/13/13 0540 06/14/13 0418  NA 136* 137  K 3.9 4.0  CL  101 101  CO2 24 24  GLUCOSE 99 99  BUN 10 11  CREATININE 0.85 0.94  CALCIUM 8.3* 9.0     Liver Panel   Recent Labs  06/13/13 0540 06/14/13 0418  PROT 6.1 6.5  ALBUMIN 3.2* 3.2*  AST 21 17  ALT 32 28  ALKPHOS 75 79  BILITOT 0.7 0.5       Sedimentation Rate  Recent Labs  06/13/13 1300  ESRSEDRATE 22*   C-Reactive Protein  Recent Labs  06/13/13 1300  CRP 18.4*    Micro Results: Recent Results (from the past 240 hour(s))  RAPID STREP SCREEN     Status: None   Collection Time    06/11/13 11:33 PM      Result Value Ref Range Status   Streptococcus, Group A Screen (Direct) NEGATIVE  NEGATIVE Final   Comment: (NOTE)     A Rapid Antigen test may result negative if the antigen level in the     sample is below the detection level of this test. The  FDA has not     cleared this test as a stand-alone test therefore the rapid antigen     negative result has reflexed to a Group A Strep culture.  CULTURE, GROUP A STREP     Status: None   Collection Time    06/11/13 11:33 PM      Result Value Ref Range Status   Specimen Description THROAT   Final   Special Requests NONE   Final   Culture     Final   Value: No Beta Hemolytic Streptococci Isolated     Performed at [facility]   Report Status 06/14/2013 FINAL   Final  CULTURE, BLOOD (ROUTINE X 2)     Status: None   Collection Time    06/12/13  3:15 AM      Result Value Ref Range Status   Specimen Description BLOOD RIGHT ARM   Final   Special Requests BOTTLES DRAWN AEROBIC AND ANAEROBIC 6ML EACH   Final   Culture  Setup Time     Final   Value: 06/12/2013 09:41     Performed at [facility]   Culture     Final   Value:        BLOOD CULTURE RECEIVED NO GROWTH TO DATE CULTURE WILL BE HELD FOR 5 DAYS BEFORE ISSUING A FINAL NEGATIVE REPORT     Performed at [facility]   Report Status PENDING   Incomplete  CULTURE, BLOOD (ROUTINE X 2)     Status: None   Collection Time    06/12/13  3:20 AM      Result Value Ref Range Status   Specimen Description BLOOD RIGHT FOREARM   Final   Special Requests BOTTLES DRAWN AEROBIC AND ANAEROBIC 6ML EACH   Final   Culture  Setup Time     Final   Value: 06/12/2013 09:41     Performed at [facility]   Culture     Final   Value:        BLOOD CULTURE RECEIVED NO GROWTH TO DATE CULTURE WILL BE HELD FOR 5 DAYS BEFORE ISSUING A FINAL NEGATIVE REPORT     Performed at [facility]   Report Status PENDING   Incomplete  CSF CULTURE     Status: None   Collection Time    06/12/13 12:46 PM      Result Value Ref Range Status   Specimen Description  CSF   Final   Special Requests NONE   Final   Gram Stain     Final   Value: CYTOSPIN WBC PRESENT, PREDOMINANTLY MONONUCLEAR     NO ORGANISMS SEEN     Performed at [facility]     Performed at [facility]   Culture     Final   Value: NO GROWTH 2 DAYS     Performed at [facility]   Report Status PENDING   Incomplete  GRAM STAIN     Status: None   Collection Time    06/12/13 12:46 PM      Result Value Ref Range Status   Specimen Description CSF   Final   Special Requests NONE   Final   Gram Stain     Final   Value: WBC PRESENT, PREDOMINANTLY MONONUCLEAR     NO ORGANISMS SEEN     CYTOSPIN   Report Status 06/12/2013 FINAL   Final  MALARIA SMEAR     Status: None   Collection Time    06/12/13  4:30 PM      Result Value Ref Range Status   Specimen Description BLOOD   Final   Special Requests NONE   Final   Malaria Prep     Final   Value: Negative pending thick smear review     Performed at [facility]   Report Status PENDING   Incomplete    Studies/Results: X-ray Chest Pa And Lateral   06/13/2013   CLINICAL DATA:  Fever.  Throat swelling.  EXAM: CHEST  2 VIEW  COMPARISON:  PA and lateral chest 06/11/2013.  FINDINGS: Small area of linear stranding in the left lung base most compatible with atelectasis is again seen. The right lung is clear. There is no pneumothorax or pleural effusion. Heart size is normal.  IMPRESSION: Mild linear opacities in the left lung base most consistent with atelectasis.   Electronically Signed   By: Inge Rise M.D.   On: 06/13/2013 13:56   Ct Soft Tissue Neck W Contrast  06/13/2013   CLINICAL DATA:  Bilateral neck swelling, fever of unknown origin  EXAM: CT NECK WITH CONTRAST  TECHNIQUE: Multidetector CT imaging of the neck was performed using the standard protocol following the bolus administration of intravenous contrast.  CONTRAST:  5mL OMNIPAQUE IOHEXOL 300 MG/ML  SOLN  COMPARISON:  None.  FINDINGS: Pharyngeal soft tissues are patent.  No evidence of retropharyngeal abscess.  No evidence of odontogenic abscess.  No suspicious cervical lymphadenopathy.  The visualized paranasal sinuses are  essentially clear. The mastoid air cells are unopacified.  Visualized thyroid is unremarkable.  Cervical spine is notable for mild degenerative changes.  Visualized lung apices are clear.  IMPRESSION: Negative CT neck.   Electronically Signed   By: Julian Hy M.D.   On: 06/13/2013 11:19   Mr Brain Wo Contrast  06/13/2013   CLINICAL DATA:  Headaches, body aches and history of fever. History of prior Rickettsia infection. Post lumbar puncture. Currently afebrile.  EXAM: MRI HEAD WITHOUT CONTRAST  TECHNIQUE: Multiplanar, multiecho pulse sequences of the brain and surrounding structures were obtained without intravenous contrast.  COMPARISON:  06/11/2013 CT.  No comparison MR.  FINDINGS: On diffusion sequence, there is a tiny area altered signal intensity within the superior left vermis which does not demonstrate restricted motion on the ADC map as may be expected if this represented an acute infarct or small abscess. This appears to represent T2  shine through. This region of slightly altered signal intensity involving the superior left vermis may reflect result of remote insult (possibly sequelae of patient's prior Rickettsia infection) or congenital abnormality. If there are progressive symptoms, followup MR imaging will contrast with attention to this region recommended to exclude acute infection or mass at this level.  No intracranial hemorrhage.  No hydrocephalus.  Ectatic cavernous segment of the internal carotid artery. Major intracranial vascular structures are patent.  Cerebellar tonsils minimally low lying but within the range of normal limits.  Probable small parotid lymph node.  IMPRESSION: No definitive findings of acute infarct or acute infection however, given the subtle altered signal intensity involving the superior vermis, followup imaging with contrast may be considered if the patient had progressive symptoms. Please see above discussion.   Electronically Signed   By: Chauncey Cruel M.D.   On:  06/13/2013 09:34      Assessment/Plan:  Principal Problem:   Fever of unknown origin (FUO) Active Problems:   SIRS (systemic inflammatory response syndrome)   Thrombocytopenia, unspecified   Rash and nonspecific skin eruption   Polyarticular arthritis    GRANVILLE WHITEFIELD is a 38 y.o. male with the onset of fevers diffuse myalgias headaches drenching sweats   #1 fever of unknown origin: Certainly we do need to consider infection with tickborne pathogens such as [facility] spotted fever and Ehrlichia. Neither can be excluded or diagnosed based on serologies (though we can check acute titers for comparison for convalsecent when they would turn positive)   He is on a diagnostic and therapeutic trial with Doxy (Which he claims caused a heart arrythmia that he noted on moniotr but not formally dx and dyspepsia), with IV doxycyline which I will now change to oral doxy.  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 Sedimentation rate C-reactive protein ANA rheumatoid factor all unremarkable   Chinkungunya virus, dengue  echo virus antibodies pending (his daugther had bout of diarrhea during drip down to [facility] but then admitted to the food that she didn't but she did have diarrhea for an entire day)  Send Parvovirus with am labs tomorrow  If his fevers do not defervesce on doxycycline and laboratory work is unrevealing we'll do other additional tests, and do a complete fever unknown origin panel of tests including expanding testing to a serum protein electrophoresis cryoglobulins and expanding his imaging with repeat CT of the chest abdomen and pelvis. I'm hoping you'll improve on doxycycline for possible tick borne infection or that he'll simply improve to this being a viral infection that will run its course.  I spent greater than 60 minutes with the patient including greater than 50% of time in face to face counsel of the patient and in coordination of  their care.    LOS: 3 days   Truman Hayward 06/14/2013, 3:47 PM

## 2013-06-14 NOTE — Progress Notes (Signed)
Patient ID: Brian [name]  male  HER:[id number]    DOB: [birth year]    DOA: 06/11/2013  PCP: Stefanie Libel, MD  Assessment/Plan:   Fever of unknown origin (FUO) with SIRS - CSF fluid analysis unremarkable so far, Gram stain showed no organism,CSF WBC count of 3, Total Protein of 27, Glucose 64. HSV pending.  - Malaria prep negative but pending thick smear review  - Influenza panel negative. , rapid strep screen negative, UA negative - RMSF IgG negative, blood cultures pending -Appreciate Dr Tommy Medal help.  - Ct neck negative.  -HIV negative.  -RF negative, ANA negative.  -ESR at 20, CRP at 18. No vision changes, headache is not temporal area.  -CMV IgM pending.  -EBV IgG at 179. IgM negative. Could reflect prior infection.  -Viral Hepatitis panel negative.  -Ehrlichia, dengue and chikungunya  pending.  -Continue with doxycycline.   DVT Prophylaxis: Lovenox.   Code Status:Full code  Family Communication:Discussed in detail with patient and his family members in the room  Disposition:  Consultants:  Infectious disease, Dr. Drucilla Schmidt  Procedures:  Lumbar puncture 06/12/13  Antibiotics:  IV doxycycline 4/28  Subjective: Still with headaches, joints pain.   Objective: Weight change:   Intake/Output Summary (Last 24 hours) at 06/14/13 1355 Last data filed at 06/13/13 1700  Gross per 24 hour  Intake    360 ml  Output      0 ml  Net    360 ml   Blood pressure 132/82, pulse 93, temperature 99.2 F (37.3 C), temperature source Oral, resp. rate 18, height 5' 11"  (1.803 m), weight 113.4 kg (250 lb), SpO2 99.00%.  Physical Exam: General: Alert and awake, oriented x3, not in any acute distress. CVS: S1-S2 clear, no murmur rubs or gallops Chest: clear to auscultation bilaterally, no wheezing, rales or rhonchi Abdomen: soft nontender, nondistended, normal bowel sounds  Extremities: no cyanosis, clubbing or edema noted bilaterally Neuro: Cranial nerves II-XII intact, no focal  neurological deficits  Lab Results: Basic Metabolic Panel:  Recent Labs Lab 06/13/13 0540 06/14/13 0418  NA 136* 137  K 3.9 4.0  CL 101 101  CO2 24 24  GLUCOSE 99 99  BUN 10 11  CREATININE 0.85 0.94  CALCIUM 8.3* 9.0   Liver Function Tests:  Recent Labs Lab 06/13/13 0540 06/14/13 0418  AST 21 17  ALT 32 28  ALKPHOS 75 79  BILITOT 0.7 0.5  PROT 6.1 6.5  ALBUMIN 3.2* 3.2*   No results found for this basename: LIPASE, AMYLASE,  in the last 168 hours No results found for this basename: AMMONIA,  in the last 168 hours CBC:  Recent Labs Lab 06/13/13 0540 06/14/13 0418  WBC 6.2 4.6  NEUTROABS  --  3.3  HGB 13.4 13.9  HCT 38.0* 40.1  MCV 86.8 87.7  PLT 127* 130*   Cardiac Enzymes:  Recent Labs Lab 06/11/13 2354 06/12/13  CKTOTAL 69  --   CKMB <1.0  --   TROPONINI  --  <0.30   BNP: No components found with this basename: POCBNP,  CBG: No results found for this basename: GLUCAP,  in the last 168 hours   Micro Results: Recent Results (from the past 240 hour(s))  RAPID STREP SCREEN     Status: None   Collection Time    06/11/13 11:33 PM      Result Value Ref Range Status   Streptococcus, Group A Screen (Direct) NEGATIVE  NEGATIVE Final   Comment: (NOTE)  A Rapid Antigen test may result negative if the antigen level in the     sample is below the detection level of this test. The FDA has not     cleared this test as a stand-alone test therefore the rapid antigen     negative result has reflexed to a Group A Strep culture.  CULTURE, GROUP A STREP     Status: None   Collection Time    06/11/13 11:33 PM      Result Value Ref Range Status   Specimen Description THROAT   Final   Special Requests NONE   Final   Culture     Final   Value: Culture reincubated for better growth     Performed at [facility]   Report Status PENDING   Incomplete  CULTURE, BLOOD (ROUTINE X 2)     Status: None   Collection Time    06/12/13  3:15 AM      Result  Value Ref Range Status   Specimen Description BLOOD RIGHT ARM   Final   Special Requests BOTTLES DRAWN AEROBIC AND ANAEROBIC 6ML EACH   Final   Culture  Setup Time     Final   Value: 06/12/2013 09:41     Performed at [facility]   Culture     Final   Value:        BLOOD CULTURE RECEIVED NO GROWTH TO DATE CULTURE WILL BE HELD FOR 5 DAYS BEFORE ISSUING A FINAL NEGATIVE REPORT     Performed at [facility]   Report Status PENDING   Incomplete  CULTURE, BLOOD (ROUTINE X 2)     Status: None   Collection Time    06/12/13  3:20 AM      Result Value Ref Range Status   Specimen Description BLOOD RIGHT FOREARM   Final   Special Requests BOTTLES DRAWN AEROBIC AND ANAEROBIC 6ML EACH   Final   Culture  Setup Time     Final   Value: 06/12/2013 09:41     Performed at [facility]   Culture     Final   Value:        BLOOD CULTURE RECEIVED NO GROWTH TO DATE CULTURE WILL BE HELD FOR 5 DAYS BEFORE ISSUING A FINAL NEGATIVE REPORT     Performed at [facility]   Report Status PENDING   Incomplete  CSF CULTURE     Status: None   Collection Time    06/12/13 12:46 PM      Result Value Ref Range Status   Specimen Description CSF   Final   Special Requests NONE   Final   Gram Stain     Final   Value: CYTOSPIN WBC PRESENT, PREDOMINANTLY MONONUCLEAR     NO ORGANISMS SEEN     Performed at [facility]     Performed at [facility]   Culture     Final   Value: NO GROWTH 2 DAYS     Performed at [facility]   Report Status PENDING   Incomplete  GRAM STAIN     Status: None   Collection Time    06/12/13 12:46 PM      Result Value Ref Range Status   Specimen Description CSF   Final   Special Requests NONE   Final   Gram Stain     Final   Value: WBC PRESENT, PREDOMINANTLY MONONUCLEAR     NO ORGANISMS  SEEN     CYTOSPIN   Report Status 06/12/2013 FINAL   Final  MALARIA SMEAR     Status: None   Collection Time    06/12/13  4:30 PM      Result  Value Ref Range Status   Specimen Description BLOOD   Final   Special Requests NONE   Final   Malaria Prep     Final   Value: Negative pending thick smear review     Performed at [facility]   Report Status PENDING   Incomplete    Studies/Results: Dg Chest 2 View  06/11/2013   CLINICAL DATA:  Generalized body aches and fever.  EXAM: CHEST  2 VIEW  COMPARISON:  CT ABD/PELV WO CM dated 04/24/2013  FINDINGS: Cardiomediastinal silhouette is unremarkable. Strandy densities in left lung base. No pleural effusions. No pneumothorax. Azygos fissure.  Multiple EKG lines overlie the patient and may obscure subtle underlying pathology. Soft tissue planes and included osseous structures are nonsuspicious. Mild degenerative change of the thoracic spine.  IMPRESSION: Strandy densities in left lung base likely reflect atelectasis.   Electronically Signed   By: Elon Alas   On: 06/11/2013 23:38   Ct Head Wo Contrast  06/11/2013   CLINICAL DATA:  GENERALIZED BODY ACHES FEVER  EXAM: CT HEAD WITHOUT CONTRAST  TECHNIQUE: Contiguous axial images were obtained from the base of the skull through the vertex without intravenous contrast  COMPARISON:  None available for comparison at time of study interpretation.  FINDINGS: The ventricles and sulci are normal. No intraparenchymal hemorrhage, mass effect nor midline shift. No acute large vascular territory infarcts.  No abnormal extra-axial fluid collections. Basal cisterns are patent.  No skull fracture. The included ocular globes and orbital contents are non-suspicious. The mastoid aircells and included paranasal sinuses are well-aerated.  IMPRESSION: No acute intracranial process ; normal noncontrast CT of the head.   Electronically Signed   By: Elon Alas   On: 06/11/2013 23:44   Ct Soft Tissue Neck W Contrast  06/13/2013   CLINICAL DATA:  Bilateral neck swelling, fever of unknown origin  EXAM: CT NECK WITH CONTRAST  TECHNIQUE: Multidetector CT imaging  of the neck was performed using the standard protocol following the bolus administration of intravenous contrast.  CONTRAST:  73m OMNIPAQUE IOHEXOL 300 MG/ML  SOLN  COMPARISON:  None.  FINDINGS: Pharyngeal soft tissues are patent.  No evidence of retropharyngeal abscess.  No evidence of odontogenic abscess.  No suspicious cervical lymphadenopathy.  The visualized paranasal sinuses are essentially clear. The mastoid air cells are unopacified.  Visualized thyroid is unremarkable.  Cervical spine is notable for mild degenerative changes.  Visualized lung apices are clear.  IMPRESSION: Negative CT neck.   Electronically Signed   By: SJulian HyM.D.   On: 06/13/2013 11:19   Mr Brain Wo Contrast  06/13/2013   CLINICAL DATA:  Headaches, body aches and history of fever. History of prior Rickettsia infection. Post lumbar puncture. Currently afebrile.  EXAM: MRI HEAD WITHOUT CONTRAST  TECHNIQUE: Multiplanar, multiecho pulse sequences of the brain and surrounding structures were obtained without intravenous contrast.  COMPARISON:  06/11/2013 CT.  No comparison MR.  FINDINGS: On diffusion sequence, there is a tiny area altered signal intensity within the superior left vermis which does not demonstrate restricted motion on the ADC map as may be expected if this represented an acute infarct or small abscess. This appears to represent T2 shine through. This region of slightly altered signal  intensity involving the superior left vermis may reflect result of remote insult (possibly sequelae of patient's prior Rickettsia infection) or congenital abnormality. If there are progressive symptoms, followup MR imaging will contrast with attention to this region recommended to exclude acute infection or mass at this level.  No intracranial hemorrhage.  No hydrocephalus.  Ectatic cavernous segment of the internal carotid artery. Major intracranial vascular structures are patent.  Cerebellar tonsils minimally low lying but within  the range of normal limits.  Probable small parotid lymph node.  IMPRESSION: No definitive findings of acute infarct or acute infection however, given the subtle altered signal intensity involving the superior vermis, followup imaging with contrast may be considered if the patient had progressive symptoms. Please see above discussion.   Electronically Signed   By: Chauncey Cruel M.D.   On: 06/13/2013 09:34   US Abdomen Complete  06/12/2013   CLINICAL DATA:  Abdominal pain and fever.  EXAM: ULTRASOUND ABDOMEN COMPLETE  COMPARISON:  None.  FINDINGS: Gallbladder:  Prior cholecystectomy.  Common bile duct:  Diameter: 6.7 mm.  Liver:  No focal lesion identified. Within normal limits in parenchymal echogenicity.  IVC:  No abnormality visualized.  Pancreas:  Visualized portion unremarkable.  Spleen:  Size and appearance within normal limits.  Right Kidney:  Length: 12.8 cm. Echogenicity within normal limits. No mass or hydronephrosis visualized.  Left Kidney:  Length: 13.6 cm. Echogenicity within normal limits. No mass or hydronephrosis visualized.  Abdominal aorta:  No aneurysm visualized.  Maximum AP dimension is 2.3 cm.  Other findings:  None.  IMPRESSION: 1. No acute findings. 2. Prior cholecystectomy.   Electronically Signed   By: Kerby Moors M.D.   On: 06/12/2013 09:24   Dg Fluoro Guide Lumbar Puncture  06/12/2013   CLINICAL DATA:  Fever and headache.  EXAM: DIAGNOSTIC LUMBAR PUNCTURE UNDER FLUOROSCOPIC GUIDANCE  FLUOROSCOPY TIME:  1 min 11 seconds.  PROCEDURE: Informed consent was obtained from the patient prior to the procedure, including potential complications of headache, allergy, and pain. With the patient prone, the lower back was prepped with Betadine. 1% Lidocaine was used for local anesthesia. Lumbar puncture was performed at the L3-L4 level using a gauge needle with return of clear CSF with an opening pressure of 22 cm water. 10 ml of CSF were obtained for laboratory studies. The patient tolerated  the procedure well and there were no apparent complications.  IMPRESSION: 1. Successful uncomplicated fluoroscopic guided lumbar puncture, as above.   Electronically Signed   By: [name] M.D.   On: 06/12/2013 13:26    Medications: Scheduled Meds: . buPROPion  300 mg Oral Daily  . doxycycline (VIBRAMYCIN) IV  100 mg Intravenous Q12H  . enoxaparin (LOVENOX) injection  40 mg Subcutaneous Q24H  . sodium chloride  3 mL Intravenous Q12H      LOS: 3 days   Elmarie Shiley M.D. [facility] 06/14/2013, 1:55 PM Pager: [phone]  If 7PM-7AM, please contact night-coverage [email or web address] Password [email or web address]

## 2013-06-14 NOTE — Progress Notes (Signed)
UR Completed.  Caydance Kuehnle Jane Stephan Nelis [phone] 06/14/2013  

## 2013-06-15 ENCOUNTER — Observation Stay (HOSPITAL_COMMUNITY): Payer: 59

## 2013-06-15 ENCOUNTER — Encounter (HOSPITAL_COMMUNITY): Payer: Self-pay | Admitting: Radiology

## 2013-06-15 DIAGNOSIS — R509 Fever, unspecified: Secondary | ICD-10-CM

## 2013-06-15 DIAGNOSIS — M130 Polyarthritis, unspecified: Secondary | ICD-10-CM

## 2013-06-15 DIAGNOSIS — R21 Rash and other nonspecific skin eruption: Secondary | ICD-10-CM

## 2013-06-15 LAB — ANGIOTENSIN CONVERTING ENZYME: ANGIOTENSIN-CONVERTING ENZYME: 55 U/L — AB (ref 8–52)

## 2013-06-15 LAB — EHRLICHIA ANTIBODY PANEL
E chaffeensis (HGE) Ab, IgG: <1:64 {titer}
E chaffeensis (HGE) Ab, IgM: <1:20 {titer}

## 2013-06-15 LAB — CSF CULTURE W GRAM STAIN

## 2013-06-15 LAB — RAPID STREP SCREEN (MED CTR MEBANE ONLY): Streptococcus, Group A Screen (Direct): NEGATIVE

## 2013-06-15 LAB — LACTATE DEHYDROGENASE: LDH: 139 U/L (ref 94–250)

## 2013-06-15 LAB — CSF CULTURE: CULTURE: NO GROWTH

## 2013-06-15 LAB — CMV ANTIBODY, IGG (EIA)

## 2013-06-15 LAB — CMV IGM: CMV IgM: <8 AU/mL (ref <30.00)

## 2013-06-15 LAB — SEDIMENTATION RATE: SED RATE: 26 mm/h — AB (ref 0–16)

## 2013-06-15 MED ORDER — METRONIDAZOLE 500 MG PO TABS
500.0000 mg | ORAL_TABLET | Freq: Three times a day (TID) | ORAL | Status: DC
  Administered 2013-06-15 – 2013-06-16 (×2): 500 mg via ORAL
  Filled 2013-06-15 (×6): qty 1

## 2013-06-15 MED ORDER — PHENYLEPHRINE HCL 2.5 % OP SOLN
1.0000 [drp] | OPHTHALMIC | Status: DC | PRN
  Filled 2013-06-15: qty 2

## 2013-06-15 MED ORDER — TROPICAMIDE 1 % OP SOLN
1.0000 [drp] | Freq: Once | OPHTHALMIC | Status: DC
  Filled 2013-06-15: qty 2

## 2013-06-15 MED ORDER — IBUPROFEN 600 MG PO TABS
600.0000 mg | ORAL_TABLET | Freq: Four times a day (QID) | ORAL | Status: DC | PRN
  Administered 2013-06-15 – 2013-06-16 (×2): 600 mg via ORAL
  Filled 2013-06-15 (×3): qty 1

## 2013-06-15 MED ORDER — DEXTROSE 5 % IV SOLN
2.0000 g | INTRAVENOUS | Status: DC
  Administered 2013-06-15 – 2013-06-18 (×4): 2 g via INTRAVENOUS
  Filled 2013-06-15 (×6): qty 2

## 2013-06-15 MED ORDER — IOHEXOL 300 MG/ML  SOLN
75.0000 mL | Freq: Once | INTRAMUSCULAR | Status: AC | PRN
  Administered 2013-06-15: 75 mL via INTRAVENOUS

## 2013-06-15 NOTE — Progress Notes (Addendum)
Brian [name] for Infectious Disease   Day #3 doxycyclinebut stopped when we went to oral as he could not tolerate it  Subjective: Pain in his mouth has worsened substantially pain throughout the mouth, pain with swallowinng, enlarged LN in neck and headache on the top of his head. Myalgias are better   Antibiotics:  Anti-infectives   Start     Dose/Rate Route Frequency Ordered Stop   06/14/13 2200  doxycycline (VIBRA-TABS) tablet 100 mg  Status:  Discontinued     100 mg Oral Every 12 hours 06/14/13 1546 06/15/13 1014   06/13/13 1230  doxycycline (VIBRAMYCIN) 100 mg in dextrose 5 % 250 mL IVPB  Status:  Discontinued     100 mg 125 mL/hr over 120 Minutes Intravenous Every 12 hours 06/13/13 1150 06/14/13 1546   06/12/13 0354  acyclovir (ZOVIRAX) 50 MG/ML injection    Comments:  Brian [name]   : cabinet override      06/12/13 0354 06/12/13 0439   06/12/13 0342  cefTRIAXone (ROCEPHIN) 1 G injection    Comments:  Brian [name]   : cabinet override      06/12/13 0342 06/12/13 0450   06/12/13 0330  acyclovir (ZOVIRAX) 1,000 mg in dextrose 5 % 150 mL IVPB     1,000 mg 170 mL/hr over 60 Minutes Intravenous  Once 06/12/13 0319 06/12/13 0540   06/12/13 0300  cefTRIAXone (ROCEPHIN) 1 g in dextrose 5 % 50 mL IVPB     1 g 100 mL/hr over 30 Minutes Intravenous  Once 06/12/13 0250 06/12/13 0455      Medications: Scheduled Meds: . buPROPion  300 mg Oral Daily  . enoxaparin (LOVENOX) injection  40 mg Subcutaneous Q24H  . sodium chloride  3 mL Intravenous Q12H  . tropicamide  1 drop Both Eyes Once   Continuous Infusions: . sodium chloride 100 mL/hr at 06/15/13 1404   PRN Meds:.acetaminophen, acetaminophen, alum & mag hydroxide-simeth, ibuprofen, magic mouthwash w/lidocaine, morphine injection, ondansetron (ZOFRAN) IV, ondansetron, oxyCODONE, phenylephrine, sorbitol    Objective: Weight change:  No intake or output data in the 24 hours ending 06/15/13 1434 Blood pressure 122/64,  pulse 96, temperature 99.5 F (37.5 C), temperature source Oral, resp. rate 18, height 5\' 11"  (1.803 m), weight 250 lb (113.4 kg), SpO2 99.00%. Temp:  [99.2 F (37.3 C)-102.2 F (39 C)] 99.5 F (37.5 C) (04/30 1404) Pulse Rate:  [96-107] 96 (04/30 0500) Resp:  [18-20] 18 (04/30 0500) BP: (122-137)/(64-90) 122/64 mmHg (04/30 0500) SpO2:  [97 %-99 %] 99 % (04/30 0500)  Physical Exam: General: Alert and awake, oriented x3, not in any acute distress.  HEENT: anicteric sclera, pupils reactive to light and accommodation, EOMI, oropharynx  With exudate on uvuval and posterior oropharynx. Tonsils are prominent  Tongue with lesion see picture:      He has tender cervical lymphadenopathy CVS tachycardic  rate, normal r, no murmur rubs or gallops  Chest: clear to auscultation bilaterally, no wheezing, rales or rhonchi  Abdomen: soft nontender, nondistended, normal bowel sounds,  MSK: he has some tenderness about this in the joints in particular although not quite frank synovitis.  Skin: see pictures  Faint partly blanching rash on flanks bilaterally           CBC:  Recent Labs Lab 06/11/13 2329 06/12/13 0950 06/13/13 0540 06/14/13 0418  HGB 15.9 13.6 13.4 13.9  HCT 44.3 39.2 38.0* 40.1  PLT 171 121* 127* 130*     BMET  Recent Labs  06/13/13 0540 06/14/13 0418  NA 136* 137  K 3.9 4.0  CL 101 101  CO2 24 24  GLUCOSE 99 99  BUN 10 11  CREATININE 0.85 0.94  CALCIUM 8.3* 9.0     Liver Panel   Recent Labs  06/13/13 0540 06/14/13 0418  PROT 6.1 6.5  ALBUMIN 3.2* 3.2*  AST 21 17  ALT 32 28  ALKPHOS 75 79  BILITOT 0.7 0.5       Sedimentation Rate  Recent Labs  06/15/13 1120  ESRSEDRATE 26*   C-Reactive Protein  Recent Labs  06/13/13 1300  CRP 18.4*    Micro Results: Recent Results (from the past 240 hour(s))  RAPID STREP SCREEN     Status: None   Collection Time    06/11/13 11:33 PM      Result Value Ref Range Status    Streptococcus, Group A Screen (Direct) NEGATIVE  NEGATIVE Final   Comment: (NOTE)     A Rapid Antigen test may result negative if the antigen level in the     sample is below the detection level of this test. The FDA has not     cleared this test as a stand-alone test therefore the rapid antigen     negative result has reflexed to a Group A Strep culture.  CULTURE, GROUP A STREP     Status: None   Collection Time    06/11/13 11:33 PM      Result Value Ref Range Status   Specimen Description THROAT   Final   Special Requests NONE   Final   Culture     Final   Value: No Beta Hemolytic Streptococci Isolated     Performed at [facility]   Report Status 06/14/2013 FINAL   Final  CULTURE, BLOOD (ROUTINE X 2)     Status: None   Collection Time    06/12/13  3:15 AM      Result Value Ref Range Status   Specimen Description BLOOD RIGHT ARM   Final   Special Requests BOTTLES DRAWN AEROBIC AND ANAEROBIC 6ML EACH   Final   Culture  Setup Time     Final   Value: 06/12/2013 09:41     Performed at [facility]   Culture     Final   Value:        BLOOD CULTURE RECEIVED NO GROWTH TO DATE CULTURE WILL BE HELD FOR 5 DAYS BEFORE ISSUING A FINAL NEGATIVE REPORT     Performed at [facility]   Report Status PENDING   Incomplete  CULTURE, BLOOD (ROUTINE X 2)     Status: None   Collection Time    06/12/13  3:20 AM      Result Value Ref Range Status   Specimen Description BLOOD RIGHT FOREARM   Final   Special Requests BOTTLES DRAWN AEROBIC AND ANAEROBIC 6ML EACH   Final   Culture  Setup Time     Final   Value: 06/12/2013 09:41     Performed at [facility]   Culture     Final   Value:        BLOOD CULTURE RECEIVED NO GROWTH TO DATE CULTURE WILL BE HELD FOR 5 DAYS BEFORE ISSUING A FINAL NEGATIVE REPORT     Performed at [facility]   Report Status PENDING   Incomplete  CSF CULTURE     Status: None   Collection Time    06/12/13 12:46 PM  Result Value Ref  Range Status   Specimen Description CSF   Final   Special Requests NONE   Final   Gram Stain     Final   Value: CYTOSPIN WBC PRESENT, PREDOMINANTLY MONONUCLEAR     NO ORGANISMS SEEN     Performed at [facility]     Performed at [facility]   Culture     Final   Value: NO GROWTH 3 DAYS     Performed at [facility]   Report Status 06/15/2013 FINAL   Final  GRAM STAIN     Status: None   Collection Time    06/12/13 12:46 PM      Result Value Ref Range Status   Specimen Description CSF   Final   Special Requests NONE   Final   Gram Stain     Final   Value: WBC PRESENT, PREDOMINANTLY MONONUCLEAR     NO ORGANISMS SEEN     CYTOSPIN   Report Status 06/12/2013 FINAL   Final  MALARIA SMEAR     Status: None   Collection Time    06/12/13  4:30 PM      Result Value Ref Range Status   Specimen Description BLOOD   Final   Special Requests NONE   Final   Malaria Prep     Final   Value: Negative pending thick smear review     Performed at [facility]   Report Status PENDING   Incomplete  RAPID STREP SCREEN     Status: None   Collection Time    06/15/13 10:30 AM      Result Value Ref Range Status   Streptococcus, Group A Screen (Direct) NEGATIVE  NEGATIVE Final   Comment: (NOTE)     A Rapid Antigen test may result negative if the antigen level in the     sample is below the detection level of this test. The FDA has not     cleared this test as a stand-alone test therefore the rapid antigen     negative result has reflexed to a Group A Strep culture.    Studies/Results: Ct Soft Tissue Neck W Contrast  06/15/2013   CLINICAL DATA:  Fever of unknown origin.  Probe pain with swelling.  EXAM: CT NECK WITH CONTRAST  TECHNIQUE: Multidetector CT imaging of the neck was performed using the standard protocol following the bolus administration of intravenous contrast.  CONTRAST:  82mL OMNIPAQUE IOHEXOL 300 MG/ML  SOLN  COMPARISON:  06/13/2013 CT.  FINDINGS: Enlarged  palatine tonsils in a symmetric fashion without drainable abscess or inflammation of the adjacent parapharyngeal space.  Level 2 adenopathy bilaterally. On the left, largest lymph node with short axis dimension of 1.4 cm. On the right, largest lymph node with short axis dimension of 1.3 cm. Smaller lymph nodes scattered throughout level 2, 3, 4 and 5 region. Right level 1 lymph node slightly atypical with appearance of a rounded fatty center.  Given patient's history, these findings are more suspicious for result of infection/inflammation than tumor (lymphoma). Atypical infection not excluded.  Internal jugular veins are patent bilaterally.  Coarse calcification right lobe were thyroid gland.  Mild cervical spondylotic changes most prominent C5-6.  Lung apices are clear.  IMPRESSION: Enlarged palatine tonsils in a symmetric fashion without drainable abscess or inflammation of the adjacent parapharyngeal space.  Level 2 adenopathy bilaterally as detailed above.  Given patient's history, these findings are more suspicious for result of infection/inflammation  than tumor (lymphoma). Atypical infection not excluded.   Electronically Signed   By: Chauncey Cruel M.D.   On: 06/15/2013 14:21      Assessment/Plan:  Principal Problem:   Fever of unknown origin (FUO) Active Problems:   SIRS (systemic inflammatory response syndrome)   Thrombocytopenia, unspecified   Rash and nonspecific skin eruption   Polyarticular arthritis    Brian [name] is a 38 y.o. 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 Malaria smear negative.  Sedimentation rate 22  C-reactive protein = 18. ANA rheumatoid factor all unremarkable  Chinkungunya virus, dengue  echo virus,  Parvovirus with am labs tomorrow.  Since admission the more prominent ssx now are his  dysphagia, cervical LA, and sensation of his tonsils enlarging --which they are on exam and by imaging.   He had rapid group a strep originally done and GAS culture done on 06/11/13 which were negative  Blood and CSF cultures negative, HSV1, 2 CSF negative and CSF normal  .I resent throat culture  I think in terms of ID causes of his syndrome need to think about Gonorrhea (which would not culture out), Arcanobacterium haemolyticum , and Fusobacterium (all would not likely grow on culture as potential bacterial cause of syndrome)  I will order Gonococcal culture from throat, GC and chlamydia from urine  Once those have been obtained I will consider starting him on unasyn vs rocephin + flagyl to cover for arcanobacterium fusobacterium. If we want to treat for GC presumptively he will need rocephin and azithro  I also think we need to continue to consider viral etiology as above or onset of Auto Immune cause.  APparently he is now having visual ssx and I have suggested that formal optho consultation might be fruitful. He does not have temporal headaches or tenderness at this time. He has been asking me for corticosteroids for his condition but I am NOT anxious to do this esp if there is risk he has pyogenic infection present.  I have stopped the doxy as he is not tolerating this orally AND RMSF, Ehrlichia would fit this picture less well witht the pharyngeal/tonsillar prominet ssx not present.   I spent greater than 40 minutes with the patient including greater than 50% of time in face to face counsel of the patient and in coordination of their care.  Dr. Johnnye Sima will take over the service tomorrow.     LOS: 4 days   Brian [name] 06/15/2013, 2:34 PM

## 2013-06-15 NOTE — Progress Notes (Signed)
UR Completed.  Vergie Living [phone] 06/15/2013

## 2013-06-15 NOTE — Consult Note (Signed)
ONNIE HATCHEL                                                                               06/15/2013                                               Pediatric Ophthalmology Consultation                                         Consult requested by: Dr. Tyrell Antonio  Reason for consultation:  FUO with "eyeball pain"  HPI: FUO in male who works in [employer]. Arthralgias, lymphatic si/sx. Pt complains of generalized low-grade eye aching over the last day as he has felt worse, seems to be consistent with fever and with spreading of tingling sensation over his whole forehead. No blurry vision or visual changes or symptoms, positive or negative.   Pertinent Medical History:   Active Ambulatory Problems    Diagnosis Date Noted  . Low back pain 05/12/2012  . Fever and chills 06/12/2013   Resolved Ambulatory Problems    Diagnosis Date Noted  . No Resolved Ambulatory Problems   Past Medical History  Diagnosis Date  . Rickettsia infection   . Kidney stone     Pertinent Ophthalmic History: wears glasses  Current Eye Medications: none  Systemic medications on admission:   Medications Prior to Admission  Medication Sig Dispense Refill  . amoxicillin (AMOXIL) 500 MG capsule Take 1 capsule (500 mg total) by mouth 3 (three) times daily. X14 days  42 capsule  0  . buPROPion (WELLBUTRIN XL) 300 MG 24 hr tablet Take 300 mg by mouth daily.      . minocycline (MINOCIN) 100 MG capsule Take 1 capsule (100 mg total) by mouth 2 (two) times daily.  20 capsule  0       ROS: arthralgias, sore throat, tingling of skin, mild eye pain  Visual Fields: FTC OU      Pupils:  Equal, brisk, no APD  Near acuity:   cc OD   CSM  J1+      cc OS   CSM  J1+  TA:       Normal to palpation OU   Dilation:  both eyes        Medication used  [  ] NS 2.5% [  ]Tropicamide  [ X ] Cyclogyl [  ] Cyclomydril   Not dilated     External:   OD:  Normal      OS:  Normal     Anterior segment exam:  By penlight    Conjunctiva:  OD:  Quiet     OS:  Quiet    Cornea:    OD: Clear, no fluorescein stain      OS: Clear, no fluorescein stain     Anterior Chamber:   OD:  Deep/quiet     OS:  Deep/quiet    Iris:  OD:  Normal      OS:  Normal     Lens:    OD:  Clear        OS:  Clear         Optic disc:  OD:  Flat, sharp, pink, healthy    OS:  Flat, sharp, pink, healthy     Central retina--examined with indirect ophthalmoscope:  OD:  Macula and vessels normal; media clear     OS:  Macula and vessels normal; media clear     Peripheral retina--examined with indirect ophthalmoscope, dilation and good cooperation:   OD:  Normal to far mid-periphery  OS:  Normal to far mid-periphery  Impression:   FUO with normal eye exam  Recommendations/Plan: No abnormal findings on exam Please reconsult if new complaints or symptoms develop  I've discussed these findings with the nurse and/or resident. Please contact our office with any questions or concerns at [phone]. Lamonte Sakai

## 2013-06-15 NOTE — Progress Notes (Signed)
Patient ID: Brian [name]  male  QAS:[id number]    DOB: [birth year]    DOA: 06/11/2013  PCP: Stefanie Libel, MD  Assessment/Plan:   Fever of unknown origin (FUO) with SIRS - CSF fluid analysis unremarkable so far, Gram stain showed no organism,CSF WBC count of 3, Total Protein of 27, Glucose 64. HSV CSF negative.   - Malaria prep negative - Influenza panel negative. , rapid strep screen negative, UA negative - RMSF IgG negative, blood cultures pending -Appreciate Dr Tommy Medal help.  - Ct neck negative. Repeat CT neck with lymphadenopathy and Enlarged palatine tonsils in a symmetric fashion without drainable  abscess or inflammation of the adjacent parapharyngeal space -HIV negative.  -RF negative, ANA negative.  -ESR at 20, CRP at 18. -CMV IgG negative.  -EBV IgG at 179. IgM negative. Could reflect prior infection.  -Viral Hepatitis panel negative.  -Ehrlichia, dengue and chikungunya  pending.  -doxycycline discontinue due to intolerance.   -Angiotensin convertin enzymes pending.  -LDH human parvovirus pending.  -Gonococcus pending.  -eyes pain; opthalmology consulted.   DVT Prophylaxis: Lovenox.   Code Status:Full code  Family Communication:Discussed in detail with patient and his family members in the room  Disposition:  Consultants:  Infectious disease, Dr. Drucilla Schmidt  Procedures:  Lumbar puncture 06/12/13  Antibiotics:  IV doxycycline 4/28  Subjective: Still with headaches, joints pain.  Now relates eye pain.   Objective: Weight change:  No intake or output data in the 24 hours ending 06/15/13 1813 Blood pressure 110/67, pulse 105, temperature 99.9 F (37.7 C), temperature source Oral, resp. rate 18, height _0  (1.803 m), weight 113.4 kg (250 lb), SpO2 96.00%.  Physical Exam: General: Alert and awake, oriented x3, not in any acute distress. CVS: S1-S2 clear, no murmur rubs or gallops Chest: clear to auscultation bilaterally, no wheezing, rales or  rhonchi Abdomen: soft nontender, nondistended, normal bowel sounds  Extremities: no cyanosis, clubbing or edema noted bilaterally Neuro: Cranial nerves II-XII intact, no focal neurological deficits  Lab Results: Basic Metabolic Panel:  Recent Labs Lab 06/13/13 0540 06/14/13 0418  NA 136* 137  K 3.9 4.0  CL 101 101  CO2 24 24  GLUCOSE 99 99  BUN 10 11  CREATININE 0.85 0.94  CALCIUM 8.3* 9.0   Liver Function Tests:  Recent Labs Lab 06/13/13 0540 06/14/13 0418  AST 21 17  ALT 32 28  ALKPHOS 75 79  BILITOT 0.7 0.5  PROT 6.1 6.5  ALBUMIN 3.2* 3.2*   No results found for this basename: LIPASE, AMYLASE,  in the last 168 hours No results found for this basename: AMMONIA,  in the last 168 hours CBC:  Recent Labs Lab 06/13/13 0540 06/14/13 0418  WBC 6.2 4.6  NEUTROABS  --  3.3  HGB 13.4 13.9  HCT 38.0* 40.1  MCV 86.8 87.7  PLT 127* 130*   Cardiac Enzymes:  Recent Labs Lab 06/11/13 2354 06/12/13  CKTOTAL 69  --   CKMB <1.0  --   TROPONINI  --  <0.30   BNP: No components found with this basename: POCBNP,  CBG: No results found for this basename: GLUCAP,  in the last 168 hours   Micro Results: Recent Results (from the past 240 hour(s))  RAPID STREP SCREEN     Status: None   Collection Time    06/11/13 11:33 PM      Result Value Ref Range Status   Streptococcus, Group A Screen (Direct) NEGATIVE  NEGATIVE Final  Comment: (NOTE)     A Rapid Antigen test may result negative if the antigen level in the     sample is below the detection level of this test. The FDA has not     cleared this test as a stand-alone test therefore the rapid antigen     negative result has reflexed to a Group A Strep culture.  CULTURE, GROUP A STREP     Status: None   Collection Time    06/11/13 11:33 PM      Result Value Ref Range Status   Specimen Description THROAT   Final   Special Requests NONE   Final   Culture     Final   Value: No Beta Hemolytic Streptococci Isolated      Performed at [facility]   Report Status 06/14/2013 FINAL   Final  CULTURE, BLOOD (ROUTINE X 2)     Status: None   Collection Time    06/12/13  3:15 AM      Result Value Ref Range Status   Specimen Description BLOOD RIGHT ARM   Final   Special Requests BOTTLES DRAWN AEROBIC AND ANAEROBIC 6ML EACH   Final   Culture  Setup Time     Final   Value: 06/12/2013 09:41     Performed at [facility]   Culture     Final   Value:        BLOOD CULTURE RECEIVED NO GROWTH TO DATE CULTURE WILL BE HELD FOR 5 DAYS BEFORE ISSUING A FINAL NEGATIVE REPORT     Performed at [facility]   Report Status PENDING   Incomplete  CULTURE, BLOOD (ROUTINE X 2)     Status: None   Collection Time    06/12/13  3:20 AM      Result Value Ref Range Status   Specimen Description BLOOD RIGHT FOREARM   Final   Special Requests BOTTLES DRAWN AEROBIC AND ANAEROBIC 6ML EACH   Final   Culture  Setup Time     Final   Value: 06/12/2013 09:41     Performed at [facility]   Culture     Final   Value:        BLOOD CULTURE RECEIVED NO GROWTH TO DATE CULTURE WILL BE HELD FOR 5 DAYS BEFORE ISSUING A FINAL NEGATIVE REPORT     Performed at [facility]   Report Status PENDING   Incomplete  CSF CULTURE     Status: None   Collection Time    06/12/13 12:46 PM      Result Value Ref Range Status   Specimen Description CSF   Final   Special Requests NONE   Final   Gram Stain     Final   Value: CYTOSPIN WBC PRESENT, PREDOMINANTLY MONONUCLEAR     NO ORGANISMS SEEN     Performed at [facility]     Performed at [facility]   Culture     Final   Value: NO GROWTH 3 DAYS     Performed at [facility]   Report Status 06/15/2013 FINAL   Final  GRAM STAIN     Status: None   Collection Time    06/12/13 12:46 PM      Result Value Ref Range Status   Specimen Description CSF   Final   Special Requests NONE   Final   Gram Stain     Final   Value: WBC PRESENT,  PREDOMINANTLY MONONUCLEAR     NO ORGANISMS SEEN     CYTOSPIN   Report Status 06/12/2013 FINAL   Final  MALARIA SMEAR     Status: None   Collection Time    06/12/13  4:30 PM      Result Value Ref Range Status   Specimen Description BLOOD   Final   Special Requests NONE   Final   Malaria Prep     Final   Value: Negative pending thick smear review     Performed at [facility]   Report Status PENDING   Incomplete  RAPID STREP SCREEN     Status: None   Collection Time    06/15/13 10:30 AM      Result Value Ref Range Status   Streptococcus, Group A Screen (Direct) NEGATIVE  NEGATIVE Final   Comment: (NOTE)     A Rapid Antigen test may result negative if the antigen level in the     sample is below the detection level of this test. The FDA has not     cleared this test as a stand-alone test therefore the rapid antigen     negative result has reflexed to a Group A Strep culture.    Studies/Results: Dg Chest 2 View  06/11/2013   CLINICAL DATA:  Generalized body aches and fever.  EXAM: CHEST  2 VIEW  COMPARISON:  CT ABD/PELV WO CM dated 04/24/2013  FINDINGS: Cardiomediastinal silhouette is unremarkable. Strandy densities in left lung base. No pleural effusions. No pneumothorax. Azygos fissure.  Multiple EKG lines overlie the patient and may obscure subtle underlying pathology. Soft tissue planes and included osseous structures are nonsuspicious. Mild degenerative change of the thoracic spine.  IMPRESSION: Strandy densities in left lung base likely reflect atelectasis.   Electronically Signed   By: Elon Alas   On: 06/11/2013 23:38   Ct Head Wo Contrast  06/11/2013   CLINICAL DATA:  GENERALIZED BODY ACHES FEVER  EXAM: CT HEAD WITHOUT CONTRAST  TECHNIQUE: Contiguous axial images were obtained from the base of the skull through the vertex without intravenous contrast  COMPARISON:  None available for comparison at time of study interpretation.  FINDINGS: The ventricles and sulci are  normal. No intraparenchymal hemorrhage, mass effect nor midline shift. No acute large vascular territory infarcts.  No abnormal extra-axial fluid collections. Basal cisterns are patent.  No skull fracture. The included ocular globes and orbital contents are non-suspicious. The mastoid aircells and included paranasal sinuses are well-aerated.  IMPRESSION: No acute intracranial process ; normal noncontrast CT of the head.   Electronically Signed   By: Elon Alas   On: 06/11/2013 23:44   Ct Soft Tissue Neck W Contrast  06/13/2013   CLINICAL DATA:  Bilateral neck swelling, fever of unknown origin  EXAM: CT NECK WITH CONTRAST  TECHNIQUE: Multidetector CT imaging of the neck was performed using the standard protocol following the bolus administration of intravenous contrast.  CONTRAST:  66m OMNIPAQUE IOHEXOL 300 MG/ML  SOLN  COMPARISON:  None.  FINDINGS: Pharyngeal soft tissues are patent.  No evidence of retropharyngeal abscess.  No evidence of odontogenic abscess.  No suspicious cervical lymphadenopathy.  The visualized paranasal sinuses are essentially clear. The mastoid air cells are unopacified.  Visualized thyroid is unremarkable.  Cervical spine is notable for mild degenerative changes.  Visualized lung apices are clear.  IMPRESSION: Negative CT neck.   Electronically Signed   By: SJulian HyM.D.   On: 06/13/2013 11:19  Mr Brain Wo Contrast  06/13/2013   CLINICAL DATA:  Headaches, body aches and history of fever. History of prior Rickettsia infection. Post lumbar puncture. Currently afebrile.  EXAM: MRI HEAD WITHOUT CONTRAST  TECHNIQUE: Multiplanar, multiecho pulse sequences of the brain and surrounding structures were obtained without intravenous contrast.  COMPARISON:  06/11/2013 CT.  No comparison MR.  FINDINGS: On diffusion sequence, there is a tiny area altered signal intensity within the superior left vermis which does not demonstrate restricted motion on the ADC map as may be expected  if this represented an acute infarct or small abscess. This appears to represent T2 shine through. This region of slightly altered signal intensity involving the superior left vermis may reflect result of remote insult (possibly sequelae of patient's prior Rickettsia infection) or congenital abnormality. If there are progressive symptoms, followup MR imaging will contrast with attention to this region recommended to exclude acute infection or mass at this level.  No intracranial hemorrhage.  No hydrocephalus.  Ectatic cavernous segment of the internal carotid artery. Major intracranial vascular structures are patent.  Cerebellar tonsils minimally low lying but within the range of normal limits.  Probable small parotid lymph node.  IMPRESSION: No definitive findings of acute infarct or acute infection however, given the subtle altered signal intensity involving the superior vermis, followup imaging with contrast may be considered if the patient had progressive symptoms. Please see above discussion.   Electronically Signed   By: Chauncey Cruel M.D.   On: 06/13/2013 09:34   US Abdomen Complete  06/12/2013   CLINICAL DATA:  Abdominal pain and fever.  EXAM: ULTRASOUND ABDOMEN COMPLETE  COMPARISON:  None.  FINDINGS: Gallbladder:  Prior cholecystectomy.  Common bile duct:  Diameter: 6.7 mm.  Liver:  No focal lesion identified. Within normal limits in parenchymal echogenicity.  IVC:  No abnormality visualized.  Pancreas:  Visualized portion unremarkable.  Spleen:  Size and appearance within normal limits.  Right Kidney:  Length: 12.8 cm. Echogenicity within normal limits. No mass or hydronephrosis visualized.  Left Kidney:  Length: 13.6 cm. Echogenicity within normal limits. No mass or hydronephrosis visualized.  Abdominal aorta:  No aneurysm visualized.  Maximum AP dimension is 2.3 cm.  Other findings:  None.  IMPRESSION: 1. No acute findings. 2. Prior cholecystectomy.   Electronically Signed   By: Kerby Moors M.D.    On: 06/12/2013 09:24   Dg Fluoro Guide Lumbar Puncture  06/12/2013   CLINICAL DATA:  Fever and headache.  EXAM: DIAGNOSTIC LUMBAR PUNCTURE UNDER FLUOROSCOPIC GUIDANCE  FLUOROSCOPY TIME:  1 min 11 seconds.  PROCEDURE: Informed consent was obtained from the patient prior to the procedure, including potential complications of headache, allergy, and pain. With the patient prone, the lower back was prepped with Betadine. 1% Lidocaine was used for local anesthesia. Lumbar puncture was performed at the L3-L4 level using a gauge needle with return of clear CSF with an opening pressure of 22 cm water. 10 ml of CSF were obtained for laboratory studies. The patient tolerated the procedure well and there were no apparent complications.  IMPRESSION: 1. Successful uncomplicated fluoroscopic guided lumbar puncture, as above.   Electronically Signed   By: [name] M.D.   On: 06/12/2013 13:26    Medications: Scheduled Meds: . buPROPion  300 mg Oral Daily  . cefTRIAXone (ROCEPHIN)  IV  2 g Intravenous Q24H  . enoxaparin (LOVENOX) injection  40 mg Subcutaneous Q24H  . metroNIDAZOLE  500 mg Oral 3 times per day  .  sodium chloride  3 mL Intravenous Q12H  . tropicamide  1 drop Both Eyes Once      LOS: 4 days   Elmarie Shiley M.D. [facility] 06/15/2013, 6:13 PM Pager: [phone]  If 7PM-7AM, please contact night-coverage [email or web address] Password [email or web address]

## 2013-06-16 DIAGNOSIS — J029 Acute pharyngitis, unspecified: Secondary | ICD-10-CM

## 2013-06-16 LAB — BASIC METABOLIC PANEL
BUN: 10 mg/dL (ref 6–23)
CALCIUM: 9 mg/dL (ref 8.4–10.5)
CO2: 26 meq/L (ref 19–32)
Chloride: 101 mEq/L (ref 96–112)
Creatinine, Ser: 0.94 mg/dL (ref 0.50–1.35)
GFR calc Af Amer: >90 mL/min (ref >90)
GLUCOSE: 101 mg/dL — AB (ref 70–99)
POTASSIUM: 4.8 meq/L (ref 3.7–5.3)
Sodium: 138 mEq/L (ref 137–147)

## 2013-06-16 LAB — CBC
HCT: 40 % (ref 39.0–52.0)
HEMOGLOBIN: 14 g/dL (ref 13.0–17.0)
MCH: 30.4 pg (ref 26.0–34.0)
MCHC: 35 g/dL (ref 30.0–36.0)
MCV: 87 fL (ref 78.0–100.0)
Platelets: 153 10*3/uL (ref 150–400)
RBC: 4.6 MIL/uL (ref 4.22–5.81)
RDW: 12.9 % (ref 11.5–15.5)
WBC: 4.5 10*3/uL (ref 4.0–10.5)

## 2013-06-16 LAB — CK: CK TOTAL: 54 U/L (ref 7–232)

## 2013-06-16 LAB — HIV-1 RNA QUANT-NO REFLEX-BLD

## 2013-06-16 LAB — GC/CHLAMYDIA PROBE AMP
CT Probe RNA: NEGATIVE
GC Probe RNA: NEGATIVE

## 2013-06-16 MED ORDER — ENSURE COMPLETE PO LIQD
237.0000 mL | Freq: Three times a day (TID) | ORAL | Status: DC
  Administered 2013-06-16 – 2013-06-18 (×5): 237 mL via ORAL

## 2013-06-16 MED ORDER — KETOROLAC TROMETHAMINE 30 MG/ML IJ SOLN
30.0000 mg | Freq: Four times a day (QID) | INTRAMUSCULAR | Status: DC | PRN
  Administered 2013-06-16 – 2013-06-17 (×3): 30 mg via INTRAVENOUS
  Filled 2013-06-16 (×3): qty 1

## 2013-06-16 MED ORDER — FLUCONAZOLE 100MG IVPB
100.0000 mg | INTRAVENOUS | Status: DC
  Administered 2013-06-16 – 2013-06-19 (×4): 100 mg via INTRAVENOUS
  Filled 2013-06-16 (×5): qty 50

## 2013-06-16 MED ORDER — DEXAMETHASONE SODIUM PHOSPHATE 4 MG/ML IJ SOLN
4.0000 mg | Freq: Two times a day (BID) | INTRAMUSCULAR | Status: DC
  Administered 2013-06-16 – 2013-06-17 (×3): 4 mg via INTRAVENOUS
  Filled 2013-06-16 (×4): qty 1

## 2013-06-16 MED ORDER — ZOLPIDEM TARTRATE 5 MG PO TABS
5.0000 mg | ORAL_TABLET | Freq: Every evening | ORAL | Status: DC | PRN
  Administered 2013-06-17 (×2): 5 mg via ORAL
  Filled 2013-06-16 (×2): qty 1

## 2013-06-16 NOTE — Consult Note (Signed)
ENT CONSULT:  Reason for Consult: Tonsil hypertrophy and fever of unknown origin Referring Physician: [facility]  Brian [name] is an 38 y.o. male.  HPI: The patient is an otherwise healthy 38 year old white male employed as a Marine scientist at [employer], he presents with a five-day history of sudden onset joint pain and fever. Appropriate workup but as an outpatient and after admission, etiology of symptoms unclear, patient treated with appropriate antibiotic therapy. In addition to his symptoms of joint pain he reports several days of moderately severe sore throat and some odynophagia. CT scan of the neck reviewed which shows bilateral tonsil hypertrophy without evidence of abscess or fluid collection and bilateral cervical lymphadenopathy. He was started on Diflucan for possible thrush and IV Decadron with an overall improvement in symptoms over the last 8 hours.  Past Medical History  Diagnosis Date  . Rickettsia infection   . Kidney stone     Past Surgical History  Procedure Laterality Date  . Cholecystectomy      Family History  Problem Relation Age of Onset  . Diabetes Mother   . Heart disease Mother   . Heart failure Mother   . Heart attack Mother   . Hyperlipidemia Mother   . Hypertension Mother   . Hyperlipidemia Father   . Sudden death Neg Hx     Social History:  reports that he has never smoked. He does not have any smokeless tobacco history on file. He reports that he does not drink alcohol or use illicit drugs.  Allergies:  Allergies  Allergen Reactions  . Doxycycline Other (See Comments)    "heart arrythmia" and "dyspepsia"  . Guaifenesin & Derivatives Other (See Comments)    Reaction unknown    Medications: I have reviewed the patient's current medications.  Results for orders placed during the hospital encounter of 06/11/13 (from the past 48 hour(s))  RAPID STREP SCREEN     Status: None   Collection Time    06/15/13 10:30 AM      Result Value  Ref Range   Streptococcus, Group A Screen (Direct) NEGATIVE  NEGATIVE   Comment: (NOTE)     A Rapid Antigen test may result negative if the antigen level in the     sample is below the detection level of this test. The FDA has not     cleared this test as a stand-alone test therefore the rapid antigen     negative result has reflexed to a Group A Strep culture.  CULTURE, FUNGUS WITHOUT SMEAR     Status: None   Collection Time    06/15/13 10:30 AM      Result Value Ref Range   Specimen Description ORAL     Special Requests Normal     Culture       Value: Culture in Progress for 7 days     Performed at [facility]   Report Status PENDING    CULTURE, GROUP A STREP     Status: None   Collection Time    06/15/13 10:30 AM      Result Value Ref Range   Specimen Description THROAT     Special Requests NONE     Culture       Value: NO SUSPICIOUS COLONIES, CONTINUING TO HOLD     Performed at [facility]   Report Status PENDING    LACTATE DEHYDROGENASE     Status: None   Collection Time    06/15/13 11:20  AM      Result Value Ref Range   LDH 139  94 - 250 U/L  ANGIOTENSIN CONVERTING ENZYME     Status: Abnormal   Collection Time    06/15/13 11:20 AM      Result Value Ref Range   Angiotensin-Converting Enzyme 55 (*) 8 - 52 U/L   Comment: Performed at [place]     Status: Abnormal   Collection Time    06/15/13 11:20 AM      Result Value Ref Range   Sed Rate 26 (*) 0 - 16 mm/hr  HIV 1 RNA QUANT-NO REFLEX-BLD     Status: None   Collection Time    06/15/13 11:20 AM      Result Value Ref Range   HIV 1 RNA Quant <20  <20 copies/mL   Comment: HIV 1 RNA not detected.   HIV1 RNA Quant, Log <1.30  <1.30 log 10   Comment: (NOTE)     This test utilizes the [place] FDA approved Roche HIV-1 Test Kit by RT-PCR.     Performed at [place]     Status: None   Collection Time    06/15/13  3:55 PM      Result Value  Ref Range   CT Probe RNA NEGATIVE  NEGATIVE   GC Probe RNA NEGATIVE  NEGATIVE   Comment: (NOTE)                                                                                               **Normal Reference Range: Negative**          Assay performed using the Gen-Probe APTIMA COMBO2 (R) Assay.     Acceptable specimen types for this assay include APTIMA Swabs (Unisex,     endocervical, urethral, or vaginal), first void urine, and ThinPrep     liquid based cytology samples.     Performed at [facility]  CBC     Status: None   Collection Time    06/16/13  5:00 AM      Result Value Ref Range   WBC 4.5  4.0 - 10.5 K/uL   RBC 4.60  4.22 - 5.81 MIL/uL   Hemoglobin 14.0  13.0 - 17.0 g/dL   HCT 40.0  39.0 - 52.0 %   MCV 87.0  78.0 - 100.0 fL   MCH 30.4  26.0 - 34.0 pg   MCHC 35.0  30.0 - 36.0 g/dL   RDW 12.9  11.5 - 15.5 %   Platelets 153  150 - 400 K/uL  BASIC METABOLIC PANEL     Status: Abnormal   Collection Time    06/16/13  5:00 AM      Result Value Ref Range   Sodium 138  137 - 147 mEq/L   Potassium 4.8  3.7 - 5.3 mEq/L   Chloride 101  96 - 112 mEq/L   CO2 26  19 - 32 mEq/L   Glucose, Bld 101 (*) 70 - 99 mg/dL   BUN 10  6 - 23 mg/dL  Creatinine, Ser 0.94  0.50 - 1.35 mg/dL   Calcium 9.0  8.4 - 10.5 mg/dL   GFR calc non Af Amer >90  >90 mL/min   GFR calc Af Amer >90  >90 mL/min   Comment: (NOTE)     The eGFR has been calculated using the CKD EPI equation.     This calculation has not been validated in all clinical situations.     eGFR's persistently <90 mL/min signify possible Chronic Kidney     Disease.  CK     Status: None   Collection Time    06/16/13 11:40 AM      Result Value Ref Range   Total CK 54  7 - 232 U/L    Ct Soft Tissue Neck W Contrast  06/15/2013   CLINICAL DATA:  Fever of unknown origin.  Probe pain with swelling.  EXAM: CT NECK WITH CONTRAST  TECHNIQUE: Multidetector CT imaging of the neck was performed using the standard protocol following  the bolus administration of intravenous contrast.  CONTRAST:  72m OMNIPAQUE IOHEXOL 300 MG/ML  SOLN  COMPARISON:  06/13/2013 CT.  FINDINGS: Enlarged palatine tonsils in a symmetric fashion without drainable abscess or inflammation of the adjacent parapharyngeal space.  Level 2 adenopathy bilaterally. On the left, largest lymph node with short axis dimension of 1.4 cm. On the right, largest lymph node with short axis dimension of 1.3 cm. Smaller lymph nodes scattered throughout level 2, 3, 4 and 5 region. Right level 1 lymph node slightly atypical with appearance of a rounded fatty center.  Given patient's history, these findings are more suspicious for result of infection/inflammation than tumor (lymphoma). Atypical infection not excluded.  Internal jugular veins are patent bilaterally.  Coarse calcification right lobe were thyroid gland.  Mild cervical spondylotic changes most prominent C5-6.  Lung apices are clear.  IMPRESSION: Enlarged palatine tonsils in a symmetric fashion without drainable abscess or inflammation of the adjacent parapharyngeal space.  Level 2 adenopathy bilaterally as detailed above.  Given patient's history, these findings are more suspicious for result of infection/inflammation than tumor (lymphoma). Atypical infection not excluded.   Electronically Signed   By: SChauncey CruelM.D.   On: 06/15/2013 14:21    ROS: No prior ENT history, nonsmoker, no tonsillitis, sore throat or recent fever until new onset symptoms.  Blood pressure 122/72, pulse 94, temperature 98.8 F (37.1 C), temperature source Oral, resp. rate 16, height _0  (1.803 m), weight 120.884 kg (266 lb 8 oz), SpO2 97.00%.  PHYSICAL EXAM: General appearance - alert, well appearing, and in no distress, mild discomfort. Ears - bilateral TM's and external ear canals normal Nose - normal and patent, no erythema, discharge or polyps Mouth - 2+ tonsils with mild erythema, no exudate, no trismus, ulcer or lesion. There is  diffuse erythema the oral mucosa and some exudate consistent with possible fungal stomatitis  Neck - bilateral symmetric anterior adenopathy  Studies Reviewed: CT scan of the head and neck  Assessment/Plan: The patient presents with acute fever and joint pain, etiology unclear but workup to date has been appropriate and thorough. Agree with Dr. HAlgis Downsconcerns regarding possible viral etiology, further workup as recommended. No evidence of acute bacterial tonsillitis or peritonsillar abscess. The patient may have early oral stomatitis/thrush, agree with IV Diflucan and possible mesh mouthwash. The patient has noted an improvement in symptoms with the addition of IV Decadron, may consider increasing dose to 10 mg IV q. 8 hours for maximal anti-inflammatory effect. Monitor  symptoms and please reconsult our service as needed.  Jerrell Belfast 06/16/2013, 5:44 PM

## 2013-06-16 NOTE — Progress Notes (Signed)
Patient ID: Brian [name]  male  ZES:[id number]    DOB: [birth year]    DOA: 06/11/2013  PCP: Stefanie Libel, MD  Assessment/Plan:   Fever of unknown origin (FUO) with SIRS - CSF fluid analysis unremarkable so far, Gram stain showed no organism,CSF WBC count of 3, Total Protein of 27, Glucose 64. HSV CSF negative.   - Malaria prep negative - Influenza panel negative. , rapid strep screen negative, UA negative - RMSF IgG negative, blood cultures no growth to date.  -Appreciate Dr Tommy Medal, [place] help.  - Ct neck negative. Repeat CT neck with lymphadenopathy and Enlarged palatine tonsils in a symmetric fashion without drainable  abscess or inflammation of the adjacent parapharyngeal space -HIV negative.  -RF negative, ANA negative.  -ESR at 20---26, CRP at 18. -CMV IgG negative.  -EBV IgG at 179. IgM negative. Could reflect prior infection.  -Viral Hepatitis panel negative.  -Ehrlichia negative, dengue and chikungunya  pending.  -doxycycline discontinue due to intolerance.   -Angiotensin convertin enzymes at 55. -LDH 139,  human parvovirus pending. Parvovirus B-19 pending.  -Gonococcus, Chlamydia probe negative.  -ANCA pending.  -Fungal Culture pending.  -Bacterial culture -Patient with pharyngitis, tonsil enlargement. He relates difficulty with swallowing. Will consult ENT.   DVT Prophylaxis: Lovenox.   Code Status:Full code  Family Communication:Discussed in detail with patient and his wife.   Disposition:  Consultants:  Infectious disease, Dr. Drucilla Schmidt  Procedures:  Lumbar puncture 06/12/13  Antibiotics:  IV doxycycline 4/28---4-30  Ceftriaxone 4-30  Anti fungal: fluconazole 5-1  Subjective: Joint pain.  Difficulty with swallowing, sore throat.   Objective: Weight change:   Intake/Output Summary (Last 24 hours) at 06/16/13 1249 Last data filed at 06/16/13 0900  Gross per 24 hour  Intake    240 ml  Output      0 ml  Net    240 ml   Blood pressure 122/83,  pulse 102, temperature 100.2 F (37.9 C), temperature source Oral, resp. rate 18, height _0  (1.803 m), weight 120.884 kg (266 lb 8 oz), SpO2 97.00%.  Physical Exam: General: Alert and awake, oriented x3, not in any acute distress. CVS: S1-S2 clear, no murmur rubs or gallops Chest: clear to auscultation bilaterally, no wheezing, rales or rhonchi Abdomen: soft nontender, nondistended, normal bowel sounds  Extremities: no cyanosis, clubbing or edema noted bilaterally Neuro: Cranial nerves II-XII intact, no focal neurological deficits  Lab Results: Basic Metabolic Panel:  Recent Labs Lab 06/14/13 0418 06/16/13 0500  NA 137 138  K 4.0 4.8  CL 101 101  CO2 24 26  GLUCOSE 99 101*  BUN 11 10  CREATININE 0.94 0.94  CALCIUM 9.0 9.0   Liver Function Tests:  Recent Labs Lab 06/13/13 0540 06/14/13 0418  AST 21 17  ALT 32 28  ALKPHOS 75 79  BILITOT 0.7 0.5  PROT 6.1 6.5  ALBUMIN 3.2* 3.2*   No results found for this basename: LIPASE, AMYLASE,  in the last 168 hours No results found for this basename: AMMONIA,  in the last 168 hours CBC:  Recent Labs Lab 06/14/13 0418 06/16/13 0500  WBC 4.6 4.5  NEUTROABS 3.3  --   HGB 13.9 14.0  HCT 40.1 40.0  MCV 87.7 87.0  PLT 130* 153   Cardiac Enzymes:  Recent Labs Lab 06/11/13 2354 06/12/13 06/16/13 1140  CKTOTAL 69  --  54  CKMB <1.0  --   --   TROPONINI  --  <0.30  --  BNP: No components found with this basename: POCBNP,  CBG: No results found for this basename: GLUCAP,  in the last 168 hours   Micro Results: Recent Results (from the past 240 hour(s))  RAPID STREP SCREEN     Status: None   Collection Time    06/11/13 11:33 PM      Result Value Ref Range Status   Streptococcus, Group A Screen (Direct) NEGATIVE  NEGATIVE Final   Comment: (NOTE)     A Rapid Antigen test may result negative if the antigen level in the     sample is below the detection level of this test. The FDA has not     cleared this test  as a stand-alone test therefore the rapid antigen     negative result has reflexed to a Group A Strep culture.  CULTURE, GROUP A STREP     Status: None   Collection Time    06/11/13 11:33 PM      Result Value Ref Range Status   Specimen Description THROAT   Final   Special Requests NONE   Final   Culture     Final   Value: No Beta Hemolytic Streptococci Isolated     Performed at [facility]   Report Status 06/14/2013 FINAL   Final  CULTURE, BLOOD (ROUTINE X 2)     Status: None   Collection Time    06/12/13  3:15 AM      Result Value Ref Range Status   Specimen Description BLOOD RIGHT ARM   Final   Special Requests BOTTLES DRAWN AEROBIC AND ANAEROBIC 6ML EACH   Final   Culture  Setup Time     Final   Value: 06/12/2013 09:41     Performed at [facility]   Culture     Final   Value:        BLOOD CULTURE RECEIVED NO GROWTH TO DATE CULTURE WILL BE HELD FOR 5 DAYS BEFORE ISSUING A FINAL NEGATIVE REPORT     Performed at [facility]   Report Status PENDING   Incomplete  CULTURE, BLOOD (ROUTINE X 2)     Status: None   Collection Time    06/12/13  3:20 AM      Result Value Ref Range Status   Specimen Description BLOOD RIGHT FOREARM   Final   Special Requests BOTTLES DRAWN AEROBIC AND ANAEROBIC 6ML EACH   Final   Culture  Setup Time     Final   Value: 06/12/2013 09:41     Performed at [facility]   Culture     Final   Value:        BLOOD CULTURE RECEIVED NO GROWTH TO DATE CULTURE WILL BE HELD FOR 5 DAYS BEFORE ISSUING A FINAL NEGATIVE REPORT     Performed at [facility]   Report Status PENDING   Incomplete  CSF CULTURE     Status: None   Collection Time    06/12/13 12:46 PM      Result Value Ref Range Status   Specimen Description CSF   Final   Special Requests NONE   Final   Gram Stain     Final   Value: CYTOSPIN WBC PRESENT, PREDOMINANTLY MONONUCLEAR     NO ORGANISMS SEEN     Performed at [facility]     Performed at [facility]   Culture     Final   Value: NO GROWTH 3  DAYS     Performed at [facility]   Report Status 06/15/2013 FINAL   Final  GRAM STAIN     Status: None   Collection Time    06/12/13 12:46 PM      Result Value Ref Range Status   Specimen Description CSF   Final   Special Requests NONE   Final   Gram Stain     Final   Value: WBC PRESENT, PREDOMINANTLY MONONUCLEAR     NO ORGANISMS SEEN     CYTOSPIN   Report Status 06/12/2013 FINAL   Final  MALARIA SMEAR     Status: None   Collection Time    06/12/13  4:30 PM      Result Value Ref Range Status   Specimen Description BLOOD   Final   Special Requests NONE   Final   Malaria Prep     Final   Value: Negative pending thick smear review     Performed at [facility]   Report Status PENDING   Incomplete  RAPID STREP SCREEN     Status: None   Collection Time    06/15/13 10:30 AM      Result Value Ref Range Status   Streptococcus, Group A Screen (Direct) NEGATIVE  NEGATIVE Final   Comment: (NOTE)     A Rapid Antigen test may result negative if the antigen level in the     sample is below the detection level of this test. The FDA has not     cleared this test as a stand-alone test therefore the rapid antigen     negative result has reflexed to a Group A Strep culture.  CULTURE, FUNGUS WITHOUT SMEAR     Status: None   Collection Time    06/15/13 10:30 AM      Result Value Ref Range Status   Specimen Description ORAL   Final   Special Requests Normal   Final   Culture     Final   Value: Culture in Progress for 7 days     Performed at [facility]   Report Status PENDING   Incomplete  CULTURE, GROUP A STREP     Status: None   Collection Time    06/15/13 10:30 AM      Result Value Ref Range Status   Specimen Description THROAT   Final   Special Requests NONE   Final   Culture     Final   Value: NO SUSPICIOUS COLONIES, CONTINUING TO HOLD     Performed at [facility]   Report Status PENDING    Incomplete  GC/CHLAMYDIA PROBE AMP     Status: None   Collection Time    06/15/13  3:55 PM      Result Value Ref Range Status   CT Probe RNA NEGATIVE  NEGATIVE Final   GC Probe RNA NEGATIVE  NEGATIVE Final   Comment: (NOTE)                                                                                               **Normal  Reference Range: Negative**          Assay performed using the Gen-Probe APTIMA COMBO2 (R) Assay.     Acceptable specimen types for this assay include APTIMA Swabs (Unisex,     endocervical, urethral, or vaginal), first void urine, and ThinPrep     liquid based cytology samples.     Performed at [facility]    Studies/Results: Dg Chest 2 View  06/11/2013   CLINICAL DATA:  Generalized body aches and fever.  EXAM: CHEST  2 VIEW  COMPARISON:  CT ABD/PELV WO CM dated 04/24/2013  FINDINGS: Cardiomediastinal silhouette is unremarkable. Strandy densities in left lung base. No pleural effusions. No pneumothorax. Azygos fissure.  Multiple EKG lines overlie the patient and may obscure subtle underlying pathology. Soft tissue planes and included osseous structures are nonsuspicious. Mild degenerative change of the thoracic spine.  IMPRESSION: Strandy densities in left lung base likely reflect atelectasis.   Electronically Signed   By: Elon Alas   On: 06/11/2013 23:38   Ct Head Wo Contrast  06/11/2013   CLINICAL DATA:  GENERALIZED BODY ACHES FEVER  EXAM: CT HEAD WITHOUT CONTRAST  TECHNIQUE: Contiguous axial images were obtained from the base of the skull through the vertex without intravenous contrast  COMPARISON:  None available for comparison at time of study interpretation.  FINDINGS: The ventricles and sulci are normal. No intraparenchymal hemorrhage, mass effect nor midline shift. No acute large vascular territory infarcts.  No abnormal extra-axial fluid collections. Basal cisterns are patent.  No skull fracture. The included ocular globes and orbital contents are  non-suspicious. The mastoid aircells and included paranasal sinuses are well-aerated.  IMPRESSION: No acute intracranial process ; normal noncontrast CT of the head.   Electronically Signed   By: Elon Alas   On: 06/11/2013 23:44   Ct Soft Tissue Neck W Contrast  06/13/2013   CLINICAL DATA:  Bilateral neck swelling, fever of unknown origin  EXAM: CT NECK WITH CONTRAST  TECHNIQUE: Multidetector CT imaging of the neck was performed using the standard protocol following the bolus administration of intravenous contrast.  CONTRAST:  70m OMNIPAQUE IOHEXOL 300 MG/ML  SOLN  COMPARISON:  None.  FINDINGS: Pharyngeal soft tissues are patent.  No evidence of retropharyngeal abscess.  No evidence of odontogenic abscess.  No suspicious cervical lymphadenopathy.  The visualized paranasal sinuses are essentially clear. The mastoid air cells are unopacified.  Visualized thyroid is unremarkable.  Cervical spine is notable for mild degenerative changes.  Visualized lung apices are clear.  IMPRESSION: Negative CT neck.   Electronically Signed   By: SJulian HyM.D.   On: 06/13/2013 11:19   Mr Brain Wo Contrast  06/13/2013   CLINICAL DATA:  Headaches, body aches and history of fever. History of prior Rickettsia infection. Post lumbar puncture. Currently afebrile.  EXAM: MRI HEAD WITHOUT CONTRAST  TECHNIQUE: Multiplanar, multiecho pulse sequences of the brain and surrounding structures were obtained without intravenous contrast.  COMPARISON:  06/11/2013 CT.  No comparison MR.  FINDINGS: On diffusion sequence, there is a tiny area altered signal intensity within the superior left vermis which does not demonstrate restricted motion on the ADC map as may be expected if this represented an acute infarct or small abscess. This appears to represent T2 shine through. This region of slightly altered signal intensity involving the superior left vermis may reflect result of remote insult (possibly sequelae of patient's prior  Rickettsia infection) or congenital abnormality. If there are progressive symptoms, followup MR imaging  will contrast with attention to this region recommended to exclude acute infection or mass at this level.  No intracranial hemorrhage.  No hydrocephalus.  Ectatic cavernous segment of the internal carotid artery. Major intracranial vascular structures are patent.  Cerebellar tonsils minimally low lying but within the range of normal limits.  Probable small parotid lymph node.  IMPRESSION: No definitive findings of acute infarct or acute infection however, given the subtle altered signal intensity involving the superior vermis, followup imaging with contrast may be considered if the patient had progressive symptoms. Please see above discussion.   Electronically Signed   By: Chauncey Cruel M.D.   On: 06/13/2013 09:34   US Abdomen Complete  06/12/2013   CLINICAL DATA:  Abdominal pain and fever.  EXAM: ULTRASOUND ABDOMEN COMPLETE  COMPARISON:  None.  FINDINGS: Gallbladder:  Prior cholecystectomy.  Common bile duct:  Diameter: 6.7 mm.  Liver:  No focal lesion identified. Within normal limits in parenchymal echogenicity.  IVC:  No abnormality visualized.  Pancreas:  Visualized portion unremarkable.  Spleen:  Size and appearance within normal limits.  Right Kidney:  Length: 12.8 cm. Echogenicity within normal limits. No mass or hydronephrosis visualized.  Left Kidney:  Length: 13.6 cm. Echogenicity within normal limits. No mass or hydronephrosis visualized.  Abdominal aorta:  No aneurysm visualized.  Maximum AP dimension is 2.3 cm.  Other findings:  None.  IMPRESSION: 1. No acute findings. 2. Prior cholecystectomy.   Electronically Signed   By: Kerby Moors M.D.   On: 06/12/2013 09:24   Dg Fluoro Guide Lumbar Puncture  06/12/2013   CLINICAL DATA:  Fever and headache.  EXAM: DIAGNOSTIC LUMBAR PUNCTURE UNDER FLUOROSCOPIC GUIDANCE  FLUOROSCOPY TIME:  1 min 11 seconds.  PROCEDURE: Informed consent was obtained from the  patient prior to the procedure, including potential complications of headache, allergy, and pain. With the patient prone, the lower back was prepped with Betadine. 1% Lidocaine was used for local anesthesia. Lumbar puncture was performed at the L3-L4 level using a gauge needle with return of clear CSF with an opening pressure of 22 cm water. 10 ml of CSF were obtained for laboratory studies. The patient tolerated the procedure well and there were no apparent complications.  IMPRESSION: 1. Successful uncomplicated fluoroscopic guided lumbar puncture, as above.   Electronically Signed   By: [name] M.D.   On: 06/12/2013 13:26    Medications: Scheduled Meds: . buPROPion  300 mg Oral Daily  . cefTRIAXone (ROCEPHIN)  IV  2 g Intravenous Q24H  . enoxaparin (LOVENOX) injection  40 mg Subcutaneous Q24H  . feeding supplement (ENSURE COMPLETE)  237 mL Oral TID WC  . fluconazole (DIFLUCAN) IV  100 mg Intravenous Q24H  . sodium chloride  3 mL Intravenous Q12H  . tropicamide  1 drop Both Eyes Once      LOS: 5 days   Elmarie Shiley M.D. [facility] 06/16/2013, 12:49 PM Pager: [phone]  If 7PM-7AM, please contact night-coverage [email or web address] Password [email or web address]

## 2013-06-16 NOTE — Progress Notes (Signed)
INFECTIOUS DISEASE PROGRESS NOTE  ID: Brian [name] is a 38 y.o. male with  Principal Problem:   Fever of unknown origin (FUO) Active Problems:   SIRS (systemic inflammatory response syndrome)   Thrombocytopenia, unspecified   Rash and nonspecific skin eruption   Polyarticular arthritis   Fever  Subjective: Afebrile so far today.  Having difficulty swallowing.   Abtx:  Anti-infectives   Start     Dose/Rate Route Frequency Ordered Stop   06/15/13 1900  cefTRIAXone (ROCEPHIN) 2 g in dextrose 5 % 50 mL IVPB     2 g 100 mL/hr over 30 Minutes Intravenous Every 24 hours 06/15/13 1720     06/15/13 1830  metroNIDAZOLE (FLAGYL) tablet 500 mg     500 mg Oral 3 times per day 06/15/13 1720     06/14/13 2200  doxycycline (VIBRA-TABS) tablet 100 mg  Status:  Discontinued     100 mg Oral Every 12 hours 06/14/13 1546 06/15/13 1014   06/13/13 1230  doxycycline (VIBRAMYCIN) 100 mg in dextrose 5 % 250 mL IVPB  Status:  Discontinued     100 mg 125 mL/hr over 120 Minutes Intravenous Every 12 hours 06/13/13 1150 06/14/13 1546   06/12/13 0354  acyclovir (ZOVIRAX) 50 MG/ML injection    Comments:  Jeanie Sewer   : cabinet override      06/12/13 0354 06/12/13 0439   06/12/13 0342  cefTRIAXone (ROCEPHIN) 1 G injection    Comments:  Jeanie Sewer   : cabinet override      06/12/13 0342 06/12/13 0450   06/12/13 0330  acyclovir (ZOVIRAX) 1,000 mg in dextrose 5 % 150 mL IVPB     1,000 mg 170 mL/hr over 60 Minutes Intravenous  Once 06/12/13 0319 06/16/13 0700   06/12/13 0300  cefTRIAXone (ROCEPHIN) 1 g in dextrose 5 % 50 mL IVPB     1 g 100 mL/hr over 30 Minutes Intravenous  Once 06/12/13 0250 06/12/13 0455      Medications:  Scheduled: . buPROPion  300 mg Oral Daily  . cefTRIAXone (ROCEPHIN)  IV  2 g Intravenous Q24H  . enoxaparin (LOVENOX) injection  40 mg Subcutaneous Q24H  . metroNIDAZOLE  500 mg Oral 3 times per day  . sodium chloride  3 mL Intravenous Q12H  . tropicamide  1 drop Both  Eyes Once    Objective: Vital signs in last 24 hours: Temp:  [98.8 F (37.1 C)-102.2 F (39 C)] 99.9 F (37.7 C) (05/01 1014) Pulse Rate:  [89-105] 102 (05/01 1014) Resp:  [18-20] 18 (05/01 1014) BP: (110-122)/(67-83) 122/83 mmHg (05/01 1014) SpO2:  [96 %-97 %] 97 % (05/01 1014) Weight:  [120.884 kg (266 lb 8 oz)] 120.884 kg (266 lb 8 oz) (05/01 0500)   General appearance: alert, cooperative and mild distress Throat: abnormal findings: thrush and tonsilar erythema, mild swelling.  Neck: no adenopathy and supple, symmetrical, trachea midline Resp: clear to auscultation bilaterally Cardio: regular rate and rhythm GI: normal findings: bowel sounds normal and soft, non-tender  Lab Results  Recent Labs  06/14/13 0418 06/16/13 0500  WBC 4.6 4.5  HGB 13.9 14.0  HCT 40.1 40.0  NA 137 138  K 4.0 4.8  CL 101 101  CO2 24 26  BUN 11 10  CREATININE 0.94 0.94   Liver Panel  Recent Labs  06/14/13 0418  PROT 6.5  ALBUMIN 3.2*  AST 17  ALT 28  ALKPHOS 79  BILITOT 0.5   Sedimentation Rate  Recent Labs  06/15/13 1120  ESRSEDRATE 26*   C-Reactive Protein  Recent Labs  06/13/13 1300  CRP 18.4*    Microbiology: Recent Results (from the past 240 hour(s))  RAPID STREP SCREEN     Status: None   Collection Time    06/11/13 11:33 PM      Result Value Ref Range Status   Streptococcus, Group A Screen (Direct) NEGATIVE  NEGATIVE Final   Comment: (NOTE)     A Rapid Antigen test may result negative if the antigen level in the     sample is below the detection level of this test. The FDA has not     cleared this test as a stand-alone test therefore the rapid antigen     negative result has reflexed to a Group A Strep culture.  CULTURE, GROUP A STREP     Status: None   Collection Time    06/11/13 11:33 PM      Result Value Ref Range Status   Specimen Description THROAT   Final   Special Requests NONE   Final   Culture     Final   Value: No Beta Hemolytic  Streptococci Isolated     Performed at [facility]   Report Status 06/14/2013 FINAL   Final  CULTURE, BLOOD (ROUTINE X 2)     Status: None   Collection Time    06/12/13  3:15 AM      Result Value Ref Range Status   Specimen Description BLOOD RIGHT ARM   Final   Special Requests BOTTLES DRAWN AEROBIC AND ANAEROBIC 6ML EACH   Final   Culture  Setup Time     Final   Value: 06/12/2013 09:41     Performed at [facility]   Culture     Final   Value:        BLOOD CULTURE RECEIVED NO GROWTH TO DATE CULTURE WILL BE HELD FOR 5 DAYS BEFORE ISSUING A FINAL NEGATIVE REPORT     Performed at [facility]   Report Status PENDING   Incomplete  CULTURE, BLOOD (ROUTINE X 2)     Status: None   Collection Time    06/12/13  3:20 AM      Result Value Ref Range Status   Specimen Description BLOOD RIGHT FOREARM   Final   Special Requests BOTTLES DRAWN AEROBIC AND ANAEROBIC 6ML EACH   Final   Culture  Setup Time     Final   Value: 06/12/2013 09:41     Performed at [facility]   Culture     Final   Value:        BLOOD CULTURE RECEIVED NO GROWTH TO DATE CULTURE WILL BE HELD FOR 5 DAYS BEFORE ISSUING A FINAL NEGATIVE REPORT     Performed at [facility]   Report Status PENDING   Incomplete  CSF CULTURE     Status: None   Collection Time    06/12/13 12:46 PM      Result Value Ref Range Status   Specimen Description CSF   Final   Special Requests NONE   Final   Gram Stain     Final   Value: CYTOSPIN WBC PRESENT, PREDOMINANTLY MONONUCLEAR     NO ORGANISMS SEEN     Performed at [facility]     Performed at [facility]   Culture     Final   Value: NO GROWTH 3 DAYS     Performed  at [facility]   Report Status 06/15/2013 FINAL   Final  GRAM STAIN     Status: None   Collection Time    06/12/13 12:46 PM      Result Value Ref Range Status   Specimen Description CSF   Final   Special Requests NONE   Final   Gram Stain     Final   Value:  WBC PRESENT, PREDOMINANTLY MONONUCLEAR     NO ORGANISMS SEEN     CYTOSPIN   Report Status 06/12/2013 FINAL   Final  MALARIA SMEAR     Status: None   Collection Time    06/12/13  4:30 PM      Result Value Ref Range Status   Specimen Description BLOOD   Final   Special Requests NONE   Final   Malaria Prep     Final   Value: Negative pending thick smear review     Performed at [facility]   Report Status PENDING   Incomplete  RAPID STREP SCREEN     Status: None   Collection Time    06/15/13 10:30 AM      Result Value Ref Range Status   Streptococcus, Group A Screen (Direct) NEGATIVE  NEGATIVE Final   Comment: (NOTE)     A Rapid Antigen test may result negative if the antigen level in the     sample is below the detection level of this test. The FDA has not     cleared this test as a stand-alone test therefore the rapid antigen     negative result has reflexed to a Group A Strep culture.  CULTURE, FUNGUS WITHOUT SMEAR     Status: None   Collection Time    06/15/13 10:30 AM      Result Value Ref Range Status   Specimen Description ORAL   Final   Special Requests Normal   Final   Culture     Final   Value: Culture in Progress for 7 days     Performed at [facility]   Report Status PENDING   Incomplete  GC/CHLAMYDIA PROBE AMP     Status: None   Collection Time    06/15/13  3:55 PM      Result Value Ref Range Status   CT Probe RNA NEGATIVE  NEGATIVE Final   GC Probe RNA NEGATIVE  NEGATIVE Final   Comment: (NOTE)                                                                                               **Normal Reference Range: Negative**          Assay performed using the Gen-Probe APTIMA COMBO2 (R) Assay.     Acceptable specimen types for this assay include APTIMA Swabs (Unisex,     endocervical, urethral, or vaginal), first void urine, and ThinPrep     liquid based cytology samples.     Performed at [facility]    Studies/Results: Ct Soft  Tissue Neck W Contrast  06/15/2013   CLINICAL DATA:  Fever of unknown origin.  Probe pain with swelling.  EXAM: CT NECK WITH CONTRAST  TECHNIQUE: Multidetector CT imaging of the neck was performed using the standard protocol following the bolus administration of intravenous contrast.  CONTRAST:  47mL OMNIPAQUE IOHEXOL 300 MG/ML  SOLN  COMPARISON:  06/13/2013 CT.  FINDINGS: Enlarged palatine tonsils in a symmetric fashion without drainable abscess or inflammation of the adjacent parapharyngeal space.  Level 2 adenopathy bilaterally. On the left, largest lymph node with short axis dimension of 1.4 cm. On the right, largest lymph node with short axis dimension of 1.3 cm. Smaller lymph nodes scattered throughout level 2, 3, 4 and 5 region. Right level 1 lymph node slightly atypical with appearance of a rounded fatty center.  Given patient's history, these findings are more suspicious for result of infection/inflammation than tumor (lymphoma). Atypical infection not excluded.  Internal jugular veins are patent bilaterally.  Coarse calcification right lobe were thyroid gland.  Mild cervical spondylotic changes most prominent C5-6.  Lung apices are clear.  IMPRESSION: Enlarged palatine tonsils in a symmetric fashion without drainable abscess or inflammation of the adjacent parapharyngeal space.  Level 2 adenopathy bilaterally as detailed above.  Given patient's history, these findings are more suspicious for result of infection/inflammation than tumor (lymphoma). Atypical infection not excluded.   Electronically Signed   By: Chauncey Cruel M.D.   On: 06/15/2013 14:21     Assessment/Plan: FUO Pharyngitis Mild thrombocytpenia  Total days of antibiotics: Doxy (4-28 --> 4/30)         4-30  Ceftriaxone/flagyl   Pending- chickungunya, dengue, CSF HSV Would check HIV RNA Check ANCA, CK  Comment- long list of viral possibilities (echo, coxsackie, adeno, metapneumovirus, ect). He has been on multiple anbx, not sure  if his throat issues are thrush or if they are part of his primary process. Will stop flagyl, add fluconazole.          [facility] (pager) [phone] [email or web address] 06/16/2013, 10:33 AM  LOS: 5 days   **Disclaimer: This note may have been dictated with voice recognition software. Similar sounding words can inadvertently be transcribed and this note may contain transcription errors which may not have been corrected upon publication of note.**

## 2013-06-17 LAB — CULTURE, GROUP A STREP

## 2013-06-17 MED ORDER — MAGIC MOUTHWASH W/LIDOCAINE
15.0000 mL | Freq: Four times a day (QID) | ORAL | Status: DC | PRN
  Administered 2013-06-17 – 2013-06-18 (×2): 15 mL via ORAL
  Filled 2013-06-17 (×2): qty 15

## 2013-06-17 MED ORDER — DEXAMETHASONE SODIUM PHOSPHATE 10 MG/ML IJ SOLN
10.0000 mg | Freq: Three times a day (TID) | INTRAMUSCULAR | Status: DC
  Administered 2013-06-17 – 2013-06-18 (×2): 10 mg via INTRAVENOUS
  Filled 2013-06-17: qty 1
  Filled 2013-06-17: qty 2.5
  Filled 2013-06-17 (×2): qty 1
  Filled 2013-06-17: qty 2.5

## 2013-06-17 NOTE — Progress Notes (Signed)
Patient ID: Brian [name]  male  TCY:[id number]    DOB: 07-10-1975    DOA: 06/11/2013  PCP: Stefanie Libel, MD  Assessment/Plan:   Fever of unknown origin (FUO) with SIRS - CSF fluid analysis unremarkable so far, Gram stain showed no organism,CSF WBC count of 3, Total Protein of 27, Glucose 64. HSV CSF negative.   - Malaria prep negative - Influenza panel negative. , rapid strep screen negative, UA negative - RMSF IgG negative, blood cultures no growth to date.  - Ct neck negative. Repeat CT neck with lymphadenopathy and Enlarged palatine tonsils in a symmetric fashion without drainable  abscess or inflammation of the adjacent parapharyngeal space -HIV negative. RF negative, ANA negative. -ESR at 20---26, CRP at 18. -CMV IgG negative.  -EBV IgG at 179. IgM negative.  reflect prior infection.  -Viral Hepatitis panel negative.  -Ehrlichia negative, dengue and chikungunya  pending.  -doxycycline discontinue due to intolerance.   -Angiotensin convertin enzymes at 55. -LDH 139,  human parvovirus negative. Parvovirus B-19 pending.  -Gonococcus, Chlamydia probe negative.  -ANCA pending.  -Fungal Culture pending.  -Bacterial culture; group A strep  negative.  -Appreciate Dr Wilburn Cornelia evaluation.  -Change decadron 10 ng TID.  -Afebrile, but was getting Toradol.  -Continue with fluconazole, ceftriaxone.   DVT Prophylaxis: Lovenox.   Code Status:Full code  Family Communication:Discussed in detail with patient and his wife.   Disposition:  Consultants:  Infectious disease, Dr. Drucilla Schmidt  Procedures:  Lumbar puncture 06/12/13  Antibiotics:  IV doxycycline 4/28---4-30  Ceftriaxone 4-30  Anti fungal: fluconazole 5-1  Subjective: Feeling better. Swallowing better.   Objective: Weight change: -0.227 kg (-8 oz)  Intake/Output Summary (Last 24 hours) at 06/17/13 1445 Last data filed at 06/17/13 1300  Gross per 24 hour  Intake 1993.33 ml  Output      0 ml  Net 1993.33 ml    Blood pressure 148/100, pulse 103, temperature 98.3 F (36.8 C), temperature source Oral, resp. rate 18, height 5' 11"  (1.803 m), weight 120.657 kg (266 lb), SpO2 100.00%.  Physical Exam: General: Alert and awake, oriented x3, not in any acute distress. CVS: S1-S2 clear, no murmur rubs or gallops Chest: clear to auscultation bilaterally, no wheezing, rales or rhonchi Abdomen: soft nontender, nondistended, normal bowel sounds  Extremities: no cyanosis, clubbing or edema noted bilaterally Neuro: Cranial nerves II-XII intact, no focal neurological deficits  Lab Results: Basic Metabolic Panel:  Recent Labs Lab 06/14/13 0418 06/16/13 0500  NA 137 138  K 4.0 4.8  CL 101 101  CO2 24 26  GLUCOSE 99 101*  BUN 11 10  CREATININE 0.94 0.94  CALCIUM 9.0 9.0   Liver Function Tests:  Recent Labs Lab 06/13/13 0540 06/14/13 0418  AST 21 17  ALT 32 28  ALKPHOS 75 79  BILITOT 0.7 0.5  PROT 6.1 6.5  ALBUMIN 3.2* 3.2*   No results found for this basename: LIPASE, AMYLASE,  in the last 168 hours No results found for this basename: AMMONIA,  in the last 168 hours CBC:  Recent Labs Lab 06/14/13 0418 06/16/13 0500  WBC 4.6 4.5  NEUTROABS 3.3  --   HGB 13.9 14.0  HCT 40.1 40.0  MCV 87.7 87.0  PLT 130* 153   Cardiac Enzymes:  Recent Labs Lab 06/11/13 2354 06/12/13 06/16/13 1140  CKTOTAL 69  --  54  CKMB <1.0  --   --   TROPONINI  --  <0.30  --    BNP: No components  found with this basename: POCBNP,  CBG: No results found for this basename: GLUCAP,  in the last 168 hours   Micro Results: Recent Results (from the past 240 hour(s))  RAPID STREP SCREEN     Status: None   Collection Time    06/11/13 11:33 PM      Result Value Ref Range Status   Streptococcus, Group A Screen (Direct) NEGATIVE  NEGATIVE Final   Comment: (NOTE)     A Rapid Antigen test may result negative if the antigen level in the     sample is below the detection level of this test. The FDA has not      cleared this test as a stand-alone test therefore the rapid antigen     negative result has reflexed to a Group A Strep culture.  CULTURE, GROUP A STREP     Status: None   Collection Time    06/11/13 11:33 PM      Result Value Ref Range Status   Specimen Description THROAT   Final   Special Requests NONE   Final   Culture     Final   Value: No Beta Hemolytic Streptococci Isolated     Performed at [facility]   Report Status 06/14/2013 FINAL   Final  CULTURE, BLOOD (ROUTINE X 2)     Status: None   Collection Time    06/12/13  3:15 AM      Result Value Ref Range Status   Specimen Description BLOOD RIGHT ARM   Final   Special Requests BOTTLES DRAWN AEROBIC AND ANAEROBIC 6ML EACH   Final   Culture  Setup Time     Final   Value: 06/12/2013 09:41     Performed at [facility]   Culture     Final   Value:        BLOOD CULTURE RECEIVED NO GROWTH TO DATE CULTURE WILL BE HELD FOR 5 DAYS BEFORE ISSUING A FINAL NEGATIVE REPORT     Performed at [facility]   Report Status PENDING   Incomplete  CULTURE, BLOOD (ROUTINE X 2)     Status: None   Collection Time    06/12/13  3:20 AM      Result Value Ref Range Status   Specimen Description BLOOD RIGHT FOREARM   Final   Special Requests BOTTLES DRAWN AEROBIC AND ANAEROBIC 6ML EACH   Final   Culture  Setup Time     Final   Value: 06/12/2013 09:41     Performed at [facility]   Culture     Final   Value:        BLOOD CULTURE RECEIVED NO GROWTH TO DATE CULTURE WILL BE HELD FOR 5 DAYS BEFORE ISSUING A FINAL NEGATIVE REPORT     Performed at [facility]   Report Status PENDING   Incomplete  CSF CULTURE     Status: None   Collection Time    06/12/13 12:46 PM      Result Value Ref Range Status   Specimen Description CSF   Final   Special Requests NONE   Final   Gram Stain     Final   Value: CYTOSPIN WBC PRESENT, PREDOMINANTLY MONONUCLEAR     NO ORGANISMS SEEN     Performed at [facility]      Performed at [facility]   Culture     Final   Value: NO GROWTH 3 DAYS  Performed at [facility]   Report Status 06/15/2013 FINAL   Final  GRAM STAIN     Status: None   Collection Time    06/12/13 12:46 PM      Result Value Ref Range Status   Specimen Description CSF   Final   Special Requests NONE   Final   Gram Stain     Final   Value: WBC PRESENT, PREDOMINANTLY MONONUCLEAR     NO ORGANISMS SEEN     CYTOSPIN   Report Status 06/12/2013 FINAL   Final  MALARIA SMEAR     Status: None   Collection Time    06/12/13  4:30 PM      Result Value Ref Range Status   Specimen Description BLOOD   Final   Special Requests NONE   Final   Malaria Prep     Final   Value: Negative pending thick smear review     Performed at [facility]   Report Status PENDING   Incomplete  RAPID STREP SCREEN     Status: None   Collection Time    06/15/13 10:30 AM      Result Value Ref Range Status   Streptococcus, Group A Screen (Direct) NEGATIVE  NEGATIVE Final   Comment: (NOTE)     A Rapid Antigen test may result negative if the antigen level in the     sample is below the detection level of this test. The FDA has not     cleared this test as a stand-alone test therefore the rapid antigen     negative result has reflexed to a Group A Strep culture.  CULTURE, FUNGUS WITHOUT SMEAR     Status: None   Collection Time    06/15/13 10:30 AM      Result Value Ref Range Status   Specimen Description ORAL   Final   Special Requests Normal   Final   Culture     Final   Value: YEAST ISOLATED;ID TO FOLLOW     Performed at [facility]   Report Status PENDING   Incomplete  CULTURE, GROUP A STREP     Status: None   Collection Time    06/15/13 10:30 AM      Result Value Ref Range Status   Specimen Description THROAT   Final   Special Requests NONE   Final   Culture     Final   Value: No Beta Hemolytic Streptococci Isolated     Performed at [facility]   Report  Status 06/17/2013 FINAL   Final  GC/CHLAMYDIA PROBE AMP     Status: None   Collection Time    06/15/13  3:55 PM      Result Value Ref Range Status   CT Probe RNA NEGATIVE  NEGATIVE Final   GC Probe RNA NEGATIVE  NEGATIVE Final   Comment: (NOTE)                                                                                               **Normal Reference Range: Negative**  Assay performed using the Gen-Probe APTIMA COMBO2 (R) Assay.     Acceptable specimen types for this assay include APTIMA Swabs (Unisex,     endocervical, urethral, or vaginal), first void urine, and ThinPrep     liquid based cytology samples.     Performed at [facility]     Status: None   Collection Time    06/15/13  3:56 PM      Result Value Ref Range Status   Specimen Description THROAT   Final   Special Requests Normal   Final   Culture     Final   Value: NO GROWTH 1 DAY     Performed at [facility]   Report Status PENDING   Incomplete    Studies/Results: Dg Chest 2 View  06/11/2013   CLINICAL DATA:  Generalized body aches and fever.  EXAM: CHEST  2 VIEW  COMPARISON:  CT ABD/PELV WO CM dated 04/24/2013  FINDINGS: Cardiomediastinal silhouette is unremarkable. Strandy densities in left lung base. No pleural effusions. No pneumothorax. Azygos fissure.  Multiple EKG lines overlie the patient and may obscure subtle underlying pathology. Soft tissue planes and included osseous structures are nonsuspicious. Mild degenerative change of the thoracic spine.  IMPRESSION: Strandy densities in left lung base likely reflect atelectasis.   Electronically Signed   By: Elon Alas   On: 06/11/2013 23:38   Ct Head Wo Contrast  06/11/2013   CLINICAL DATA:  GENERALIZED BODY ACHES FEVER  EXAM: CT HEAD WITHOUT CONTRAST  TECHNIQUE: Contiguous axial images were obtained from the base of the skull through the vertex without intravenous contrast  COMPARISON:  None available for  comparison at time of study interpretation.  FINDINGS: The ventricles and sulci are normal. No intraparenchymal hemorrhage, mass effect nor midline shift. No acute large vascular territory infarcts.  No abnormal extra-axial fluid collections. Basal cisterns are patent.  No skull fracture. The included ocular globes and orbital contents are non-suspicious. The mastoid aircells and included paranasal sinuses are well-aerated.  IMPRESSION: No acute intracranial process ; normal noncontrast CT of the head.   Electronically Signed   By: Elon Alas   On: 06/11/2013 23:44   Ct Soft Tissue Neck W Contrast  06/13/2013   CLINICAL DATA:  Bilateral neck swelling, fever of unknown origin  EXAM: CT NECK WITH CONTRAST  TECHNIQUE: Multidetector CT imaging of the neck was performed using the standard protocol following the bolus administration of intravenous contrast.  CONTRAST:  50mL OMNIPAQUE IOHEXOL 300 MG/ML  SOLN  COMPARISON:  None.  FINDINGS: Pharyngeal soft tissues are patent.  No evidence of retropharyngeal abscess.  No evidence of odontogenic abscess.  No suspicious cervical lymphadenopathy.  The visualized paranasal sinuses are essentially clear. The mastoid air cells are unopacified.  Visualized thyroid is unremarkable.  Cervical spine is notable for mild degenerative changes.  Visualized lung apices are clear.  IMPRESSION: Negative CT neck.   Electronically Signed   By: Julian Hy M.D.   On: 06/13/2013 11:19   Mr Brain Wo Contrast  06/13/2013   CLINICAL DATA:  Headaches, body aches and history of fever. History of prior Rickettsia infection. Post lumbar puncture. Currently afebrile.  EXAM: MRI HEAD WITHOUT CONTRAST  TECHNIQUE: Multiplanar, multiecho pulse sequences of the brain and surrounding structures were obtained without intravenous contrast.  COMPARISON:  06/11/2013 CT.  No comparison MR.  FINDINGS: On diffusion sequence, there is a tiny area altered signal intensity within the superior left  vermis  which does not demonstrate restricted motion on the ADC map as may be expected if this represented an acute infarct or small abscess. This appears to represent T2 shine through. This region of slightly altered signal intensity involving the superior left vermis may reflect result of remote insult (possibly sequelae of patient's prior Rickettsia infection) or congenital abnormality. If there are progressive symptoms, followup MR imaging will contrast with attention to this region recommended to exclude acute infection or mass at this level.  No intracranial hemorrhage.  No hydrocephalus.  Ectatic cavernous segment of the internal carotid artery. Major intracranial vascular structures are patent.  Cerebellar tonsils minimally low lying but within the range of normal limits.  Probable small parotid lymph node.  IMPRESSION: No definitive findings of acute infarct or acute infection however, given the subtle altered signal intensity involving the superior vermis, followup imaging with contrast may be considered if the patient had progressive symptoms. Please see above discussion.   Electronically Signed   By: Chauncey Cruel M.D.   On: 06/13/2013 09:34   US Abdomen Complete  06/12/2013   CLINICAL DATA:  Abdominal pain and fever.  EXAM: ULTRASOUND ABDOMEN COMPLETE  COMPARISON:  None.  FINDINGS: Gallbladder:  Prior cholecystectomy.  Common bile duct:  Diameter: 6.7 mm.  Liver:  No focal lesion identified. Within normal limits in parenchymal echogenicity.  IVC:  No abnormality visualized.  Pancreas:  Visualized portion unremarkable.  Spleen:  Size and appearance within normal limits.  Right Kidney:  Length: 12.8 cm. Echogenicity within normal limits. No mass or hydronephrosis visualized.  Left Kidney:  Length: 13.6 cm. Echogenicity within normal limits. No mass or hydronephrosis visualized.  Abdominal aorta:  No aneurysm visualized.  Maximum AP dimension is 2.3 cm.  Other findings:  None.  IMPRESSION: 1. No acute  findings. 2. Prior cholecystectomy.   Electronically Signed   By: Kerby Moors M.D.   On: 06/12/2013 09:24   Dg Fluoro Guide Lumbar Puncture  06/12/2013   CLINICAL DATA:  Fever and headache.  EXAM: DIAGNOSTIC LUMBAR PUNCTURE UNDER FLUOROSCOPIC GUIDANCE  FLUOROSCOPY TIME:  1 min 11 seconds.  PROCEDURE: Informed consent was obtained from the patient prior to the procedure, including potential complications of headache, allergy, and pain. With the patient prone, the lower back was prepped with Betadine. 1% Lidocaine was used for local anesthesia. Lumbar puncture was performed at the L3-L4 level using a gauge needle with return of clear CSF with an opening pressure of 22 cm water. 10 ml of CSF were obtained for laboratory studies. The patient tolerated the procedure well and there were no apparent complications.  IMPRESSION: 1. Successful uncomplicated fluoroscopic guided lumbar puncture, as above.   Electronically Signed   By: [name] M.D.   On: 06/12/2013 13:26    Medications: Scheduled Meds: . buPROPion  300 mg Oral Daily  . cefTRIAXone (ROCEPHIN)  IV  2 g Intravenous Q24H  . dexamethasone  10 mg Intravenous 3 times per day  . enoxaparin (LOVENOX) injection  40 mg Subcutaneous Q24H  . feeding supplement (ENSURE COMPLETE)  237 mL Oral TID WC  . fluconazole (DIFLUCAN) IV  100 mg Intravenous Q24H  . sodium chloride  3 mL Intravenous Q12H  . tropicamide  1 drop Both Eyes Once      LOS: 6 days   Elmarie Shiley M.D. [facility] 06/17/2013, 2:45 PM Pager: [phone]  If 7PM-7AM, please contact night-coverage [email or web address] Password [email or web address]

## 2013-06-18 LAB — BASIC METABOLIC PANEL WITH GFR
BUN: 13 mg/dL (ref 6–23)
CO2: 23 meq/L (ref 19–32)
Calcium: 8.7 mg/dL (ref 8.4–10.5)
Chloride: 104 meq/L (ref 96–112)
Creatinine, Ser: 0.73 mg/dL (ref 0.50–1.35)
GFR calc Af Amer: >90 mL/min
GFR calc non Af Amer: >90 mL/min
Glucose, Bld: 138 mg/dL — ABNORMAL HIGH (ref 70–99)
Potassium: 4.4 meq/L (ref 3.7–5.3)
Sodium: 139 meq/L (ref 137–147)

## 2013-06-18 MED ORDER — DEXAMETHASONE SODIUM PHOSPHATE 4 MG/ML IJ SOLN
4.0000 mg | Freq: Three times a day (TID) | INTRAMUSCULAR | Status: DC
  Administered 2013-06-18 – 2013-06-19 (×4): 4 mg via INTRAVENOUS
  Filled 2013-06-18 (×6): qty 1

## 2013-06-18 MED ORDER — DEXAMETHASONE SODIUM PHOSPHATE 10 MG/ML IJ SOLN
4.0000 mg | Freq: Three times a day (TID) | INTRAMUSCULAR | Status: DC
Start: 2013-06-18 — End: 2013-06-18

## 2013-06-18 NOTE — Progress Notes (Signed)
Patient ID: Brian [name]  male  WEX:[id number]    DOB: 1975/11/28    DOA: 06/11/2013  PCP: Stefanie Libel, MD  Assessment/Plan:   Fever of unknown origin (FUO) with SIRS - CSF fluid analysis unremarkable so far, Gram stain showed no organism,CSF WBC count of 3, Total Protein of 27, Glucose 64. HSV CSF negative.   - Malaria prep negative - Influenza panel negative. , rapid strep screen negative, UA negative - RMSF IgG negative, blood cultures no growth to date.  - Ct neck negative. Repeat CT neck with lymphadenopathy and Enlarged palatine tonsils in a symmetric fashion without drainable  abscess or inflammation of the adjacent parapharyngeal space -HIV negative. RF negative, ANA negative. -ESR at 20---26, CRP at 18. -CMV IgG negative.  -EBV IgG at 179. IgM negative.  reflect prior infection.  -Viral Hepatitis panel negative.  -Ehrlichia negative, dengue and chikungunya  pending.  -doxycycline discontinue due to intolerance.   -Angiotensin convertin enzymes at 55. -LDH 139,  human parvovirus negative. Parvovirus B-19 pending.  -Gonococcus, Chlamydia probe negative.  -ANCA pending.  -Fungal Culture pending.  -Bacterial culture; group A strep  negative.  -Appreciate Dr Wilburn Cornelia evaluation.  -Change decadron 4 mg TID. Patient getting anxious.  -Afebrile, has only received one dose of tylenol this morning.  -Continue with fluconazole, ceftriaxone.   DVT Prophylaxis: Lovenox.   Code Status:Full code  Family Communication:Discussed in detail with patient and his wife.   Disposition: Might be able to be discharge 5-4 if ok with ID.   Consultants:  Infectious disease, Dr. Drucilla Schmidt  Procedures:  Lumbar puncture 06/12/13  Antibiotics:  IV doxycycline 4/28---4-30  Ceftriaxone 4-30  Anti fungal: fluconazole 5-1  Subjective: Feeling better. Swallowing better. Willing to eat regular food.   Objective: Weight change: -3.674 kg (-8 lb 1.6 oz)  Intake/Output Summary (Last 24  hours) at 06/18/13 1548 Last data filed at 06/18/13 0600  Gross per 24 hour  Intake 2326.67 ml  Output      0 ml  Net 2326.67 ml   Blood pressure 137/89, pulse 96, temperature 98.1 F (36.7 C), temperature source Oral, resp. rate 18, height _0  (1.803 m), weight 116.983 kg (257 lb 14.4 oz), SpO2 100.00%.  Physical Exam: General: Alert and awake, oriented x3, not in any acute distress. CVS: S1-S2 clear, no murmur rubs or gallops Chest: clear to auscultation bilaterally, no wheezing, rales or rhonchi Abdomen: soft nontender, nondistended, normal bowel sounds  Extremities: no cyanosis, clubbing or edema noted bilaterally Neuro: Cranial nerves II-XII intact, no focal neurological deficits  Lab Results: Basic Metabolic Panel:  Recent Labs Lab 06/16/13 0500 06/18/13 0347  NA 138 139  K 4.8 4.4  CL 101 104  CO2 26 23  GLUCOSE 101* 138*  BUN 10 13  CREATININE 0.94 0.73  CALCIUM 9.0 8.7   Liver Function Tests:  Recent Labs Lab 06/13/13 0540 06/14/13 0418  AST 21 17  ALT 32 28  ALKPHOS 75 79  BILITOT 0.7 0.5  PROT 6.1 6.5  ALBUMIN 3.2* 3.2*   No results found for this basename: LIPASE, AMYLASE,  in the last 168 hours No results found for this basename: AMMONIA,  in the last 168 hours CBC:  Recent Labs Lab 06/14/13 0418 06/16/13 0500  WBC 4.6 4.5  NEUTROABS 3.3  --   HGB 13.9 14.0  HCT 40.1 40.0  MCV 87.7 87.0  PLT 130* 153   Cardiac Enzymes:  Recent Labs Lab 06/11/13 2354 06/12/13 06/16/13 1140  CKTOTAL 69  --  16  CKMB <1.0  --   --   TROPONINI  --  <0.30  --    BNP: No components found with this basename: POCBNP,  CBG: No results found for this basename: GLUCAP,  in the last 168 hours   Micro Results: Recent Results (from the past 240 hour(s))  RAPID STREP SCREEN     Status: None   Collection Time    06/11/13 11:33 PM      Result Value Ref Range Status   Streptococcus, Group A Screen (Direct) NEGATIVE  NEGATIVE Final   Comment: (NOTE)      A Rapid Antigen test may result negative if the antigen level in the     sample is below the detection level of this test. The FDA has not     cleared this test as a stand-alone test therefore the rapid antigen     negative result has reflexed to a Group A Strep culture.  CULTURE, GROUP A STREP     Status: None   Collection Time    06/11/13 11:33 PM      Result Value Ref Range Status   Specimen Description THROAT   Final   Special Requests NONE   Final   Culture     Final   Value: No Beta Hemolytic Streptococci Isolated     Performed at [facility]   Report Status 06/14/2013 FINAL   Final  CULTURE, BLOOD (ROUTINE X 2)     Status: None   Collection Time    06/12/13  3:15 AM      Result Value Ref Range Status   Specimen Description BLOOD RIGHT ARM   Final   Special Requests BOTTLES DRAWN AEROBIC AND ANAEROBIC 6ML EACH   Final   Culture  Setup Time     Final   Value: 06/12/2013 09:41     Performed at [facility]   Culture     Final   Value:        BLOOD CULTURE RECEIVED NO GROWTH TO DATE CULTURE WILL BE HELD FOR 5 DAYS BEFORE ISSUING A FINAL NEGATIVE REPORT     Performed at [facility]   Report Status PENDING   Incomplete  CULTURE, BLOOD (ROUTINE X 2)     Status: None   Collection Time    06/12/13  3:20 AM      Result Value Ref Range Status   Specimen Description BLOOD RIGHT FOREARM   Final   Special Requests BOTTLES DRAWN AEROBIC AND ANAEROBIC 6ML EACH   Final   Culture  Setup Time     Final   Value: 06/12/2013 09:41     Performed at [facility]   Culture     Final   Value:        BLOOD CULTURE RECEIVED NO GROWTH TO DATE CULTURE WILL BE HELD FOR 5 DAYS BEFORE ISSUING A FINAL NEGATIVE REPORT     Performed at [facility]   Report Status PENDING   Incomplete  CSF CULTURE     Status: None   Collection Time    06/12/13 12:46 PM      Result Value Ref Range Status   Specimen Description CSF   Final   Special Requests NONE   Final    Gram Stain     Final   Value: CYTOSPIN WBC PRESENT, PREDOMINANTLY MONONUCLEAR     NO ORGANISMS SEEN     Performed at  [facility]     Performed at [facility]   Culture     Final   Value: NO GROWTH 3 DAYS     Performed at [facility]   Report Status 06/15/2013 FINAL   Final  GRAM STAIN     Status: None   Collection Time    06/12/13 12:46 PM      Result Value Ref Range Status   Specimen Description CSF   Final   Special Requests NONE   Final   Gram Stain     Final   Value: WBC PRESENT, PREDOMINANTLY MONONUCLEAR     NO ORGANISMS SEEN     CYTOSPIN   Report Status 06/12/2013 FINAL   Final  MALARIA SMEAR     Status: None   Collection Time    06/12/13  4:30 PM      Result Value Ref Range Status   Specimen Description BLOOD   Final   Special Requests NONE   Final   Malaria Prep     Final   Value: Negative pending thick smear review     Performed at [facility]   Report Status PENDING   Incomplete  RAPID STREP SCREEN     Status: None   Collection Time    06/15/13 10:30 AM      Result Value Ref Range Status   Streptococcus, Group A Screen (Direct) NEGATIVE  NEGATIVE Final   Comment: (NOTE)     A Rapid Antigen test may result negative if the antigen level in the     sample is below the detection level of this test. The FDA has not     cleared this test as a stand-alone test therefore the rapid antigen     negative result has reflexed to a Group A Strep culture.  CULTURE, FUNGUS WITHOUT SMEAR     Status: None   Collection Time    06/15/13 10:30 AM      Result Value Ref Range Status   Specimen Description ORAL   Final   Special Requests Normal   Final   Culture     Final   Value: YEAST ISOLATED;ID TO FOLLOW     Performed at [facility]   Report Status PENDING   Incomplete  CULTURE, GROUP A STREP     Status: None   Collection Time    06/15/13 10:30 AM      Result Value Ref Range Status   Specimen Description THROAT   Final   Special  Requests NONE   Final   Culture     Final   Value: No Beta Hemolytic Streptococci Isolated     Performed at [facility]   Report Status 06/17/2013 FINAL   Final  GC/CHLAMYDIA PROBE AMP     Status: None   Collection Time    06/15/13  3:55 PM      Result Value Ref Range Status   CT Probe RNA NEGATIVE  NEGATIVE Final   GC Probe RNA NEGATIVE  NEGATIVE Final   Comment: (NOTE)                                                                                               **  Normal Reference Range: Negative**          Assay performed using the Gen-Probe APTIMA COMBO2 (R) Assay.     Acceptable specimen types for this assay include APTIMA Swabs (Unisex,     endocervical, urethral, or vaginal), first void urine, and ThinPrep     liquid based cytology samples.     Performed at [place]     Status: None   Collection Time    06/15/13  3:56 PM      Result Value Ref Range Status   Specimen Description THROAT   Final   Special Requests Normal   Final   Culture     Final   Value: NO GROWTH 1 DAY     Performed at [facility]   Report Status PENDING   Incomplete    Studies/Results: Dg Chest 2 View  06/11/2013   CLINICAL DATA:  Generalized body aches and fever.  EXAM: CHEST  2 VIEW  COMPARISON:  CT ABD/PELV WO CM dated 04/24/2013  FINDINGS: Cardiomediastinal silhouette is unremarkable. Strandy densities in left lung base. No pleural effusions. No pneumothorax. Azygos fissure.  Multiple EKG lines overlie the patient and may obscure subtle underlying pathology. Soft tissue planes and included osseous structures are nonsuspicious. Mild degenerative change of the thoracic spine.  IMPRESSION: Strandy densities in left lung base likely reflect atelectasis.   Electronically Signed   By: Elon Alas   On: 06/11/2013 23:38   Ct Head Wo Contrast  06/11/2013   CLINICAL DATA:  GENERALIZED BODY ACHES FEVER  EXAM: CT HEAD WITHOUT CONTRAST  TECHNIQUE: Contiguous  axial images were obtained from the base of the skull through the vertex without intravenous contrast  COMPARISON:  None available for comparison at time of study interpretation.  FINDINGS: The ventricles and sulci are normal. No intraparenchymal hemorrhage, mass effect nor midline shift. No acute large vascular territory infarcts.  No abnormal extra-axial fluid collections. Basal cisterns are patent.  No skull fracture. The included ocular globes and orbital contents are non-suspicious. The mastoid aircells and included paranasal sinuses are well-aerated.  IMPRESSION: No acute intracranial process ; normal noncontrast CT of the head.   Electronically Signed   By: Elon Alas   On: 06/11/2013 23:44   Ct Soft Tissue Neck W Contrast  06/13/2013   CLINICAL DATA:  Bilateral neck swelling, fever of unknown origin  EXAM: CT NECK WITH CONTRAST  TECHNIQUE: Multidetector CT imaging of the neck was performed using the standard protocol following the bolus administration of intravenous contrast.  CONTRAST:  53m OMNIPAQUE IOHEXOL 300 MG/ML  SOLN  COMPARISON:  None.  FINDINGS: Pharyngeal soft tissues are patent.  No evidence of retropharyngeal abscess.  No evidence of odontogenic abscess.  No suspicious cervical lymphadenopathy.  The visualized paranasal sinuses are essentially clear. The mastoid air cells are unopacified.  Visualized thyroid is unremarkable.  Cervical spine is notable for mild degenerative changes.  Visualized lung apices are clear.  IMPRESSION: Negative CT neck.   Electronically Signed   By: SJulian HyM.D.   On: 06/13/2013 11:19   Mr Brain Wo Contrast  06/13/2013   CLINICAL DATA:  Headaches, body aches and history of fever. History of prior Rickettsia infection. Post lumbar puncture. Currently afebrile.  EXAM: MRI HEAD WITHOUT CONTRAST  TECHNIQUE: Multiplanar, multiecho pulse sequences of the brain and surrounding structures were obtained without intravenous contrast.  COMPARISON:   06/11/2013 CT.  No comparison MR.  FINDINGS: On diffusion sequence,  there is a tiny area altered signal intensity within the superior left vermis which does not demonstrate restricted motion on the ADC map as may be expected if this represented an acute infarct or small abscess. This appears to represent T2 shine through. This region of slightly altered signal intensity involving the superior left vermis may reflect result of remote insult (possibly sequelae of patient's prior Rickettsia infection) or congenital abnormality. If there are progressive symptoms, followup MR imaging will contrast with attention to this region recommended to exclude acute infection or mass at this level.  No intracranial hemorrhage.  No hydrocephalus.  Ectatic cavernous segment of the internal carotid artery. Major intracranial vascular structures are patent.  Cerebellar tonsils minimally low lying but within the range of normal limits.  Probable small parotid lymph node.  IMPRESSION: No definitive findings of acute infarct or acute infection however, given the subtle altered signal intensity involving the superior vermis, followup imaging with contrast may be considered if the patient had progressive symptoms. Please see above discussion.   Electronically Signed   By: Chauncey Cruel M.D.   On: 06/13/2013 09:34   US Abdomen Complete  06/12/2013   CLINICAL DATA:  Abdominal pain and fever.  EXAM: ULTRASOUND ABDOMEN COMPLETE  COMPARISON:  None.  FINDINGS: Gallbladder:  Prior cholecystectomy.  Common bile duct:  Diameter: 6.7 mm.  Liver:  No focal lesion identified. Within normal limits in parenchymal echogenicity.  IVC:  No abnormality visualized.  Pancreas:  Visualized portion unremarkable.  Spleen:  Size and appearance within normal limits.  Right Kidney:  Length: 12.8 cm. Echogenicity within normal limits. No mass or hydronephrosis visualized.  Left Kidney:  Length: 13.6 cm. Echogenicity within normal limits. No mass or hydronephrosis  visualized.  Abdominal aorta:  No aneurysm visualized.  Maximum AP dimension is 2.3 cm.  Other findings:  None.  IMPRESSION: 1. No acute findings. 2. Prior cholecystectomy.   Electronically Signed   By: Kerby Moors M.D.   On: 06/12/2013 09:24   Dg Fluoro Guide Lumbar Puncture  06/12/2013   CLINICAL DATA:  Fever and headache.  EXAM: DIAGNOSTIC LUMBAR PUNCTURE UNDER FLUOROSCOPIC GUIDANCE  FLUOROSCOPY TIME:  1 min 11 seconds.  PROCEDURE: Informed consent was obtained from the patient prior to the procedure, including potential complications of headache, allergy, and pain. With the patient prone, the lower back was prepped with Betadine. 1% Lidocaine was used for local anesthesia. Lumbar puncture was performed at the L3-L4 level using a gauge needle with return of clear CSF with an opening pressure of 22 cm water. 10 ml of CSF were obtained for laboratory studies. The patient tolerated the procedure well and there were no apparent complications.  IMPRESSION: 1. Successful uncomplicated fluoroscopic guided lumbar puncture, as above.   Electronically Signed   By: [name] M.D.   On: 06/12/2013 13:26    Medications: Scheduled Meds: . buPROPion  300 mg Oral Daily  . cefTRIAXone (ROCEPHIN)  IV  2 g Intravenous Q24H  . dexamethasone  4 mg Intravenous 3 times per day  . enoxaparin (LOVENOX) injection  40 mg Subcutaneous Q24H  . feeding supplement (ENSURE COMPLETE)  237 mL Oral TID WC  . fluconazole (DIFLUCAN) IV  100 mg Intravenous Q24H  . sodium chloride  3 mL Intravenous Q12H      LOS: 7 days   Elmarie Shiley M.D. [facility] 06/18/2013, 3:48 PM Pager: [phone]  If 7PM-7AM, please contact night-coverage [email or web address] Password [email or web address]

## 2013-06-19 DIAGNOSIS — M129 Arthropathy, unspecified: Secondary | ICD-10-CM

## 2013-06-19 LAB — GONOCOCCUS CULTURE
Culture: NO GROWTH
Special Requests: NORMAL

## 2013-06-19 LAB — CULTURE, BLOOD (ROUTINE X 2)
CULTURE: NO GROWTH
Culture: NO GROWTH

## 2013-06-19 LAB — MISCELLANEOUS TEST: Miscellaneous Test: NOT DETECTED

## 2013-06-19 LAB — HIV-1 RNA QUANT-NO REFLEX-BLD

## 2013-06-19 LAB — CREATININE, SERUM
CREATININE: 0.82 mg/dL (ref 0.50–1.35)
GFR calc Af Amer: >90 mL/min (ref >90)
GFR calc non Af Amer: >90 mL/min (ref >90)

## 2013-06-19 LAB — PARVOVIRUS B19 ANTIBODY, IGG AND IGM
Parovirus B19 IgG Abs: 6 index — ABNORMAL HIGH (ref <0.9)
Parovirus B19 IgM Abs: 0.1 index (ref <0.9)

## 2013-06-19 LAB — CULTURE, FUNGUS WITHOUT SMEAR: Special Requests: NORMAL

## 2013-06-19 LAB — ANCA SCREEN W REFLEX TITER
Atypical p-ANCA Screen: NEGATIVE
c-ANCA Screen: NEGATIVE
p-ANCA Screen: NEGATIVE

## 2013-06-19 MED ORDER — ENSURE COMPLETE PO LIQD: 237.0000 mL | Freq: Three times a day (TID) | ORAL | Status: DC

## 2013-06-19 MED ORDER — FLUCONAZOLE 100 MG PO TABS: 100.0000 mg | ORAL_TABLET | Freq: Every day | ORAL | Status: DC

## 2013-06-19 MED ORDER — PREDNISONE (PAK) 10 MG PO TABS: ORAL_TABLET | Freq: Every day | ORAL | Status: DC

## 2013-06-19 NOTE — Progress Notes (Signed)
INFECTIOUS DISEASE PROGRESS NOTE  ID: Brian [name] is a 38 y.o. male with  Principal Problem:   Fever of unknown origin (FUO) Active Problems:   SIRS (systemic inflammatory response syndrome)   Thrombocytopenia, unspecified   Rash and nonspecific skin eruption   Polyarticular arthritis   Fever  Subjective: Feels better, eating solids.   Abtx:  Anti-infectives   Start     Dose/Rate Route Frequency Ordered Stop   06/16/13 1200  fluconazole (DIFLUCAN) IVPB 100 mg     100 mg 50 mL/hr over 60 Minutes Intravenous Every 24 hours 06/16/13 1057     06/15/13 1900  cefTRIAXone (ROCEPHIN) 2 g in dextrose 5 % 50 mL IVPB     2 g 100 mL/hr over 30 Minutes Intravenous Every 24 hours 06/15/13 1720     06/15/13 1830  metroNIDAZOLE (FLAGYL) tablet 500 mg  Status:  Discontinued     500 mg Oral 3 times per day 06/15/13 1720 06/16/13 1057   06/14/13 2200  doxycycline (VIBRA-TABS) tablet 100 mg  Status:  Discontinued     100 mg Oral Every 12 hours 06/14/13 1546 06/15/13 1014   06/13/13 1230  doxycycline (VIBRAMYCIN) 100 mg in dextrose 5 % 250 mL IVPB  Status:  Discontinued     100 mg 125 mL/hr over 120 Minutes Intravenous Every 12 hours 06/13/13 1150 06/14/13 1546   06/12/13 0354  acyclovir (ZOVIRAX) 50 MG/ML injection    Comments:  Jeanie Sewer   : cabinet override      06/12/13 0354 06/12/13 0439   06/12/13 0342  cefTRIAXone (ROCEPHIN) 1 G injection    Comments:  Jeanie Sewer   : cabinet override      06/12/13 0342 06/12/13 0450   06/12/13 0330  acyclovir (ZOVIRAX) 1,000 mg in dextrose 5 % 150 mL IVPB     1,000 mg 170 mL/hr over 60 Minutes Intravenous  Once 06/12/13 0319 06/16/13 0700   06/12/13 0300  cefTRIAXone (ROCEPHIN) 1 g in dextrose 5 % 50 mL IVPB     1 g 100 mL/hr over 30 Minutes Intravenous  Once 06/12/13 0250 06/12/13 0455      Medications:  Scheduled: . buPROPion  300 mg Oral Daily  . cefTRIAXone (ROCEPHIN)  IV  2 g Intravenous Q24H  . dexamethasone  4 mg Intravenous 3  times per day  . enoxaparin (LOVENOX) injection  40 mg Subcutaneous Q24H  . feeding supplement (ENSURE COMPLETE)  237 mL Oral TID WC  . fluconazole (DIFLUCAN) IV  100 mg Intravenous Q24H  . sodium chloride  3 mL Intravenous Q12H    Objective: Vital signs in last 24 hours: Temp:  [97.6 F (36.4 C)-98.6 F (37 C)] 98.1 F (36.7 C) (05/04 0854) Pulse Rate:  [89-102] 89 (05/04 0854) Resp:  [18] 18 (05/04 0854) BP: (127-133)/(79-88) 127/79 mmHg (05/04 0854) SpO2:  [97 %-99 %] 97 % (05/04 0854) Weight:  [117 kg (257 lb 15 oz)] 117 kg (257 lb 15 oz) (05/04 0643)   General appearance: alert, cooperative and no distress Eyes: negative findings: conjunctivae and sclerae normal and pupils equal, round, reactive to light and accomodation Throat: normal findings: oropharynx pink & moist without lesions or evidence of thrush Resp: clear to auscultation bilaterally Cardio: regular rate and rhythm GI: normal findings: bowel sounds normal and soft, non-tender  Lab Results  Recent Labs  06/18/13 0347 06/19/13 0345  NA 139  --   K 4.4  --   CL 104  --  CO2 23  --   BUN 13  --   CREATININE 0.73 0.82   Liver Panel No results found for this basename: PROT, ALBUMIN, AST, ALT, ALKPHOS, BILITOT, BILIDIR, IBILI,  in the last 72 hours Sedimentation Rate No results found for this basename: ESRSEDRATE,  in the last 72 hours C-Reactive Protein No results found for this basename: CRP,  in the last 72 hours  Microbiology: Recent Results (from the past 240 hour(s))  RAPID STREP SCREEN     Status: None   Collection Time    06/11/13 11:33 PM      Result Value Ref Range Status   Streptococcus, Group A Screen (Direct) NEGATIVE  NEGATIVE Final   Comment: (NOTE)     A Rapid Antigen test may result negative if the antigen level in the     sample is below the detection level of this test. The FDA has not     cleared this test as a stand-alone test therefore the rapid antigen     negative result has  reflexed to a Group A Strep culture.  CULTURE, GROUP A STREP     Status: None   Collection Time    06/11/13 11:33 PM      Result Value Ref Range Status   Specimen Description THROAT   Final   Special Requests NONE   Final   Culture     Final   Value: No Beta Hemolytic Streptococci Isolated     Performed at [facility]   Report Status 06/14/2013 FINAL   Final  CULTURE, BLOOD (ROUTINE X 2)     Status: None   Collection Time    06/12/13  3:15 AM      Result Value Ref Range Status   Specimen Description BLOOD RIGHT ARM   Final   Special Requests BOTTLES DRAWN AEROBIC AND ANAEROBIC 6ML EACH   Final   Culture  Setup Time     Final   Value: 06/12/2013 09:41     Performed at [facility]   Culture     Final   Value: NO GROWTH 5 DAYS     Performed at [facility]   Report Status 06/19/2013 FINAL   Final  CULTURE, BLOOD (ROUTINE X 2)     Status: None   Collection Time    06/12/13  3:20 AM      Result Value Ref Range Status   Specimen Description BLOOD RIGHT FOREARM   Final   Special Requests BOTTLES DRAWN AEROBIC AND ANAEROBIC 6ML EACH   Final   Culture  Setup Time     Final   Value: 06/12/2013 09:41     Performed at [facility]   Culture     Final   Value: NO GROWTH 5 DAYS     Performed at [facility]   Report Status 06/19/2013 FINAL   Final  CSF CULTURE     Status: None   Collection Time    06/12/13 12:46 PM      Result Value Ref Range Status   Specimen Description CSF   Final   Special Requests NONE   Final   Gram Stain     Final   Value: CYTOSPIN WBC PRESENT, PREDOMINANTLY MONONUCLEAR     NO ORGANISMS SEEN     Performed at [facility]     Performed at [facility]   Culture     Final   Value: NO GROWTH 3 DAYS  Performed at [facility]   Report Status 06/15/2013 FINAL   Final  GRAM STAIN     Status: None   Collection Time    06/12/13 12:46 PM      Result Value Ref Range Status   Specimen  Description CSF   Final   Special Requests NONE   Final   Gram Stain     Final   Value: WBC PRESENT, PREDOMINANTLY MONONUCLEAR     NO ORGANISMS SEEN     CYTOSPIN   Report Status 06/12/2013 FINAL   Final  MALARIA SMEAR     Status: None   Collection Time    06/12/13  4:30 PM      Result Value Ref Range Status   Specimen Description BLOOD   Final   Special Requests NONE   Final   Malaria Prep     Final   Value: Negative pending thick smear review     Performed at [facility]   Report Status PENDING   Incomplete  RAPID STREP SCREEN     Status: None   Collection Time    06/15/13 10:30 AM      Result Value Ref Range Status   Streptococcus, Group A Screen (Direct) NEGATIVE  NEGATIVE Final   Comment: (NOTE)     A Rapid Antigen test may result negative if the antigen level in the     sample is below the detection level of this test. The FDA has not     cleared this test as a stand-alone test therefore the rapid antigen     negative result has reflexed to a Group A Strep culture.  CULTURE, FUNGUS WITHOUT SMEAR     Status: None   Collection Time    06/15/13 10:30 AM      Result Value Ref Range Status   Specimen Description ORAL   Final   Special Requests Normal   Final   Culture     Final   Value: CANDIDA ALBICANS     Performed at [facility]   Report Status 06/19/2013 FINAL   Final  CULTURE, GROUP A STREP     Status: None   Collection Time    06/15/13 10:30 AM      Result Value Ref Range Status   Specimen Description THROAT   Final   Special Requests NONE   Final   Culture     Final   Value: No Beta Hemolytic Streptococci Isolated     Performed at [facility]   Report Status 06/17/2013 FINAL   Final  GC/CHLAMYDIA PROBE AMP     Status: None   Collection Time    06/15/13  3:55 PM      Result Value Ref Range Status   CT Probe RNA NEGATIVE  NEGATIVE Final   GC Probe RNA NEGATIVE  NEGATIVE Final   Comment: (NOTE)                                                                                                **Normal Reference Range: Negative**  Assay performed using the Gen-Probe APTIMA COMBO2 (R) Assay.     Acceptable specimen types for this assay include APTIMA Swabs (Unisex,     endocervical, urethral, or vaginal), first void urine, and ThinPrep     liquid based cytology samples.     Performed at [facility]     Status: None   Collection Time    06/15/13  3:56 PM      Result Value Ref Range Status   Specimen Description THROAT   Final   Special Requests Normal   Final   Culture     Final   Value: NO GROWTH 2 DAYS     Performed at [facility]   Report Status 06/19/2013 FINAL   Final    Studies/Results: No results found.   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  Would plan for d/c.  He can f/u in ID clinic if further fevers. 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  Steroid taper.  Back to work in 1 week, at end of steroids.    [facility] (pager) [phone] [email or web address] 06/19/2013, 2:16 PM  LOS: 8 days   **Disclaimer: This note may have been dictated with voice recognition software. Similar sounding words can inadvertently be transcribed and this note may contain transcription errors which may not have been corrected upon publication of note.**

## 2013-06-19 NOTE — Progress Notes (Signed)
Pt. Got d/c orders and follow up appointments,prescriptions as well.IV was d/c.Pt. Ready to go home with wife.

## 2013-06-19 NOTE — Discharge Summary (Addendum)
Physician Discharge Summary  Brian [name] RWE:[id number] DOB: February 18, 1975 DOA: 06/11/2013  PCP: Stefanie Libel, MD  Admit date: 06/11/2013 Discharge date: 06/19/2013  Time spent: 35 minutes  Recommendations for Outpatient Follow-up:  1. Labs that needs to be follow up; fungal culture, dengue virus and chicunguya.    Discharge Diagnoses:    Fever of unknown origin (FUO)   SIRS (systemic inflammatory response syndrome)   Pharyngitis   Oral thrush.    Thrombocytopenia, unspecified   Rash and nonspecific skin eruption   Polyarticular arthritis   Fever   Discharge Condition: Stable.   Diet recommendation: Regular diet.   Filed Weights   06/17/13 0512 06/18/13 0500 06/19/13 0643  Weight: 120.657 kg (266 lb) 116.983 kg (257 lb 14.4 oz) 117 kg (257 lb 15 oz)    History of present illness:  Brian [name] is a 38 y.o. male significant past medical history as a transfer from [place] for further evaluation of fevers and chills. Patient states that symptoms started last Saturday, developing generalized weakness, malaise, myalgias, bilateral arthralgias, headaches, fevers and chills. Having a temperature as high as 103. 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 He denies recent travel, tic bites, rashes or hunting. 18 at the urgent care Center where he was prescribed amoxicillin and minocycline. Patient going to [place] overnight where workup was unremarkable. CBC showed a white count of 6.3 with a lactate of 1.3. Chest X-Ray did not an reveal acute infiltrate is a CT scan of brain showed no acute intracranial abnormalities. [name] is unremarkable. Blood cultures were taken, patient was given a dose of ceftriaxone 1 mg IV as well as acyclovir grams IV x1. Tubes were obtained. 2 attempts were made at performing lumbar puncture however these were unsuccessful. Patient was transferred to [facility] for further evaluation and treatment.     Hospital Course:  Fever of unknown origin (FUO) with SIRS  - CSF fluid analysis unremarkable so far, Gram stain showed no organism,CSF WBC count of 3, Total Protein of 27, Glucose 64. HSV CSF negative.  - Malaria prep negative  - Influenza panel negative. , rapid strep screen negative, UA negative  - RMSF IgG negative, blood cultures no growth to date.  - Ct neck negative. Repeat CT neck with lymphadenopathy and Enlarged palatine tonsils in a symmetric fashion without drainable abscess or inflammation of the adjacent parapharyngeal space  -HIV negative. RF negative, ANA negative. -ESR at 20---26, CRP at 18.  -CMV IgG negative.  -EBV IgG at 179. IgM negative. reflect prior infection.  -Viral Hepatitis panel negative.  -Ehrlichia negative, dengue and chikungunya pending.  -doxycycline discontinue due to intolerance.  -Angiotensin convertin enzymes at 55.  -LDH 139, human parvovirus negative. Parvovirus B-19 IGM negative, IgG positive reflect prior infection. -Gonococcus, Chlamydia probe negative.  -ANCA negative.   -Fungal Culture grew yeast.  -Bacterial culture; group A strep negative.  -Appreciate Dr Wilburn Cornelia evaluation.  -Afebrile, he is feeling better/  -He improved on IV decadron. Will provide taper over 4 days.  -Continue with fluconazole for 3 more days, received ceftriaxone. Total day of antibiotics 8. No further antibiotics on discharge.  Patient probably had viral infection. No evidence of autoimmune diseases.    Thrombocytopenia; has resolved.   Procedures:    Consultations:  ID  ENT, Dr Wilburn Cornelia  Discharge Exam: Filed Vitals:   06/19/13 0854  BP: 127/79  Pulse: 89  Temp: 98.1 F (  36.7 C)  Resp: 18    General: No distress.  Cardiovascular: S 1, S 2 RRR Respiratory: CTA  Discharge Instructions You were cared for by a hospitalist during your hospital stay. If you have any questions about your discharge medications or the care you received while you  were in the hospital after you are discharged, you can call the unit and asked to speak with the hospitalist on call if the hospitalist that took care of you is not available. Once you are discharged, your primary care physician will handle any further medical issues. Please note that NO REFILLS for any discharge medications will be authorized once you are discharged, as it is imperative that you return to your primary care physician (or establish a relationship with a primary care physician if you do not have one) for your aftercare needs so that they can reassess your need for medications and monitor your lab values.  Discharge Orders   Future Orders Complete By Expires   Diet general  As directed    Increase activity slowly  As directed        Medication List    STOP taking these medications       amoxicillin 500 MG capsule  Commonly known as:  AMOXIL     minocycline 100 MG capsule  Commonly known as:  MINOCIN      TAKE these medications       feeding supplement (ENSURE COMPLETE) Liqd  Take 237 mLs by mouth 3 (three) times daily with meals.     fluconazole 100 MG tablet  Commonly known as:  DIFLUCAN  Take 1 tablet (100 mg total) by mouth daily.     predniSONE 10 MG tablet  Commonly known as:  STERAPRED UNI-PAK  Take by mouth daily. Take 4 tablets for 2 days then 3 tablets for one day then 1 tablet then stop.     WELLBUTRIN XL 300 MG 24 hr tablet  Generic drug:  buPROPion  Take 300 mg by mouth daily.       Allergies  Allergen Reactions  . Doxycycline Other (See Comments)    "heart arrythmia" and "dyspepsia"  . Guaifenesin & Derivatives Other (See Comments)    Reaction unknown       Follow-up Information   Follow up with Stefanie Libel, MD In 1 week.   Specialties:  Sports Medicine, Family Medicine   Contact information:   [address] [place] [address] [phone]        The results of significant diagnostics from this  hospitalization (including imaging, microbiology, ancillary and laboratory) are listed below for reference.    Significant Diagnostic Studies: X-ray Chest Pa And Lateral   06/13/2013   CLINICAL DATA:  Fever.  Throat swelling.  EXAM: CHEST  2 VIEW  COMPARISON:  PA and lateral chest 06/11/2013.  FINDINGS: Small area of linear stranding in the left lung base most compatible with atelectasis is again seen. The right lung is clear. There is no pneumothorax or pleural effusion. Heart size is normal.  IMPRESSION: Mild linear opacities in the left lung base most consistent with atelectasis.   Electronically Signed   By: Inge Rise M.D.   On: 06/13/2013 13:56   Dg Chest 2 View  06/11/2013   CLINICAL DATA:  Generalized body aches and fever.  EXAM: CHEST  2 VIEW  COMPARISON:  CT ABD/PELV WO CM dated 04/24/2013  FINDINGS: Cardiomediastinal silhouette is unremarkable. Strandy densities in left lung  base. No pleural effusions. No pneumothorax. Azygos fissure.  Multiple EKG lines overlie the patient and may obscure subtle underlying pathology. Soft tissue planes and included osseous structures are nonsuspicious. Mild degenerative change of the thoracic spine.  IMPRESSION: Strandy densities in left lung base likely reflect atelectasis.   Electronically Signed   By: Elon Alas   On: 06/11/2013 23:38   Ct Head Wo Contrast  06/11/2013   CLINICAL DATA:  GENERALIZED BODY ACHES FEVER  EXAM: CT HEAD WITHOUT CONTRAST  TECHNIQUE: Contiguous axial images were obtained from the base of the skull through the vertex without intravenous contrast  COMPARISON:  None available for comparison at time of study interpretation.  FINDINGS: The ventricles and sulci are normal. No intraparenchymal hemorrhage, mass effect nor midline shift. No acute large vascular territory infarcts.  No abnormal extra-axial fluid collections. Basal cisterns are patent.  No skull fracture. The included ocular globes and orbital contents are  non-suspicious. The mastoid aircells and included paranasal sinuses are well-aerated.  IMPRESSION: No acute intracranial process ; normal noncontrast CT of the head.   Electronically Signed   By: Elon Alas   On: 06/11/2013 23:44   Ct Soft Tissue Neck W Contrast  06/15/2013   CLINICAL DATA:  Fever of unknown origin.  Probe pain with swelling.  EXAM: CT NECK WITH CONTRAST  TECHNIQUE: Multidetector CT imaging of the neck was performed using the standard protocol following the bolus administration of intravenous contrast.  CONTRAST:  38m OMNIPAQUE IOHEXOL 300 MG/ML  SOLN  COMPARISON:  06/13/2013 CT.  FINDINGS: Enlarged palatine tonsils in a symmetric fashion without drainable abscess or inflammation of the adjacent parapharyngeal space.  Level 2 adenopathy bilaterally. On the left, largest lymph node with short axis dimension of 1.4 cm. On the right, largest lymph node with short axis dimension of 1.3 cm. Smaller lymph nodes scattered throughout level 2, 3, 4 and 5 region. Right level 1 lymph node slightly atypical with appearance of a rounded fatty center.  Given patient's history, these findings are more suspicious for result of infection/inflammation than tumor (lymphoma). Atypical infection not excluded.  Internal jugular veins are patent bilaterally.  Coarse calcification right lobe were thyroid gland.  Mild cervical spondylotic changes most prominent C5-6.  Lung apices are clear.  IMPRESSION: Enlarged palatine tonsils in a symmetric fashion without drainable abscess or inflammation of the adjacent parapharyngeal space.  Level 2 adenopathy bilaterally as detailed above.  Given patient's history, these findings are more suspicious for result of infection/inflammation than tumor (lymphoma). Atypical infection not excluded.   Electronically Signed   By: SChauncey CruelM.D.   On: 06/15/2013 14:21   Ct Soft Tissue Neck W Contrast  06/13/2013   CLINICAL DATA:  Bilateral neck swelling, fever of unknown origin   EXAM: CT NECK WITH CONTRAST  TECHNIQUE: Multidetector CT imaging of the neck was performed using the standard protocol following the bolus administration of intravenous contrast.  CONTRAST:  731mOMNIPAQUE IOHEXOL 300 MG/ML  SOLN  COMPARISON:  None.  FINDINGS: Pharyngeal soft tissues are patent.  No evidence of retropharyngeal abscess.  No evidence of odontogenic abscess.  No suspicious cervical lymphadenopathy.  The visualized paranasal sinuses are essentially clear. The mastoid air cells are unopacified.  Visualized thyroid is unremarkable.  Cervical spine is notable for mild degenerative changes.  Visualized lung apices are clear.  IMPRESSION: Negative CT neck.   Electronically Signed   By: SrJulian Hy.D.   On: 06/13/2013 11:19   Mr Brain  Wo Contrast  06/13/2013   CLINICAL DATA:  Headaches, body aches and history of fever. History of prior Rickettsia infection. Post lumbar puncture. Currently afebrile.  EXAM: MRI HEAD WITHOUT CONTRAST  TECHNIQUE: Multiplanar, multiecho pulse sequences of the brain and surrounding structures were obtained without intravenous contrast.  COMPARISON:  06/11/2013 CT.  No comparison MR.  FINDINGS: On diffusion sequence, there is a tiny area altered signal intensity within the superior left vermis which does not demonstrate restricted motion on the ADC map as may be expected if this represented an acute infarct or small abscess. This appears to represent T2 shine through. This region of slightly altered signal intensity involving the superior left vermis may reflect result of remote insult (possibly sequelae of patient's prior Rickettsia infection) or congenital abnormality. If there are progressive symptoms, followup MR imaging will contrast with attention to this region recommended to exclude acute infection or mass at this level.  No intracranial hemorrhage.  No hydrocephalus.  Ectatic cavernous segment of the internal carotid artery. Major intracranial vascular  structures are patent.  Cerebellar tonsils minimally low lying but within the range of normal limits.  Probable small parotid lymph node.  IMPRESSION: No definitive findings of acute infarct or acute infection however, given the subtle altered signal intensity involving the superior vermis, followup imaging with contrast may be considered if the patient had progressive symptoms. Please see above discussion.   Electronically Signed   By: Chauncey Cruel M.D.   On: 06/13/2013 09:34   US Abdomen Complete  06/12/2013   CLINICAL DATA:  Abdominal pain and fever.  EXAM: ULTRASOUND ABDOMEN COMPLETE  COMPARISON:  None.  FINDINGS: Gallbladder:  Prior cholecystectomy.  Common bile duct:  Diameter: 6.7 mm.  Liver:  No focal lesion identified. Within normal limits in parenchymal echogenicity.  IVC:  No abnormality visualized.  Pancreas:  Visualized portion unremarkable.  Spleen:  Size and appearance within normal limits.  Right Kidney:  Length: 12.8 cm. Echogenicity within normal limits. No mass or hydronephrosis visualized.  Left Kidney:  Length: 13.6 cm. Echogenicity within normal limits. No mass or hydronephrosis visualized.  Abdominal aorta:  No aneurysm visualized.  Maximum AP dimension is 2.3 cm.  Other findings:  None.  IMPRESSION: 1. No acute findings. 2. Prior cholecystectomy.   Electronically Signed   By: Kerby Moors M.D.   On: 06/12/2013 09:24   Dg Fluoro Guide Lumbar Puncture  06/12/2013   CLINICAL DATA:  Fever and headache.  EXAM: DIAGNOSTIC LUMBAR PUNCTURE UNDER FLUOROSCOPIC GUIDANCE  FLUOROSCOPY TIME:  1 min 11 seconds.  PROCEDURE: Informed consent was obtained from the patient prior to the procedure, including potential complications of headache, allergy, and pain. With the patient prone, the lower back was prepped with Betadine. 1% Lidocaine was used for local anesthesia. Lumbar puncture was performed at the L3-L4 level using a gauge needle with return of clear CSF with an opening pressure of 22 cm water.  10 ml of CSF were obtained for laboratory studies. The patient tolerated the procedure well and there were no apparent complications.  IMPRESSION: 1. Successful uncomplicated fluoroscopic guided lumbar puncture, as above.   Electronically Signed   By: [name] M.D.   On: 06/12/2013 13:26    Microbiology: Recent Results (from the past 240 hour(s))  RAPID STREP SCREEN     Status: None   Collection Time    06/11/13 11:33 PM      Result Value Ref Range Status   Streptococcus, Group A Screen (Direct) NEGATIVE  NEGATIVE Final   Comment: (NOTE)     A Rapid Antigen test may result negative if the antigen level in the     sample is below the detection level of this test. The FDA has not     cleared this test as a stand-alone test therefore the rapid antigen     negative result has reflexed to a Group A Strep culture.  CULTURE, GROUP A STREP     Status: None   Collection Time    06/11/13 11:33 PM      Result Value Ref Range Status   Specimen Description THROAT   Final   Special Requests NONE   Final   Culture     Final   Value: No Beta Hemolytic Streptococci Isolated     Performed at [facility]   Report Status 06/14/2013 FINAL   Final  CULTURE, BLOOD (ROUTINE X 2)     Status: None   Collection Time    06/12/13  3:15 AM      Result Value Ref Range Status   Specimen Description BLOOD RIGHT ARM   Final   Special Requests BOTTLES DRAWN AEROBIC AND ANAEROBIC 6ML EACH   Final   Culture  Setup Time     Final   Value: 06/12/2013 09:41     Performed at [facility]   Culture     Final   Value: NO GROWTH 5 DAYS     Performed at [facility]   Report Status 06/19/2013 FINAL   Final  CULTURE, BLOOD (ROUTINE X 2)     Status: None   Collection Time    06/12/13  3:20 AM      Result Value Ref Range Status   Specimen Description BLOOD RIGHT FOREARM   Final   Special Requests BOTTLES DRAWN AEROBIC AND ANAEROBIC 6ML EACH   Final   Culture  Setup Time     Final    Value: 06/12/2013 09:41     Performed at [facility]   Culture     Final   Value: NO GROWTH 5 DAYS     Performed at [facility]   Report Status 06/19/2013 FINAL   Final  CSF CULTURE     Status: None   Collection Time    06/12/13 12:46 PM      Result Value Ref Range Status   Specimen Description CSF   Final   Special Requests NONE   Final   Gram Stain     Final   Value: CYTOSPIN WBC PRESENT, PREDOMINANTLY MONONUCLEAR     NO ORGANISMS SEEN     Performed at [facility]     Performed at [facility]   Culture     Final   Value: NO GROWTH 3 DAYS     Performed at [facility]   Report Status 06/15/2013 FINAL   Final  GRAM STAIN     Status: None   Collection Time    06/12/13 12:46 PM      Result Value Ref Range Status   Specimen Description CSF   Final   Special Requests NONE   Final   Gram Stain     Final   Value: WBC PRESENT, PREDOMINANTLY MONONUCLEAR     NO ORGANISMS SEEN     CYTOSPIN   Report Status 06/12/2013 FINAL   Final  MALARIA SMEAR     Status: None   Collection Time    06/12/13  4:30 PM      Result Value Ref Range Status   Specimen Description BLOOD   Final   Special Requests NONE   Final   Malaria Prep     Final   Value: Negative pending thick smear review     Performed at [facility]   Report Status PENDING   Incomplete  RAPID STREP SCREEN     Status: None   Collection Time    06/15/13 10:30 AM      Result Value Ref Range Status   Streptococcus, Group A Screen (Direct) NEGATIVE  NEGATIVE Final   Comment: (NOTE)     A Rapid Antigen test may result negative if the antigen level in the     sample is below the detection level of this test. The FDA has not     cleared this test as a stand-alone test therefore the rapid antigen     negative result has reflexed to a Group A Strep culture.  CULTURE, FUNGUS WITHOUT SMEAR     Status: None   Collection Time    06/15/13 10:30 AM      Result Value Ref Range Status    Specimen Description ORAL   Final   Special Requests Normal   Final   Culture     Final   Value: CANDIDA ALBICANS     Performed at [facility]   Report Status 06/19/2013 FINAL   Final  CULTURE, GROUP A STREP     Status: None   Collection Time    06/15/13 10:30 AM      Result Value Ref Range Status   Specimen Description THROAT   Final   Special Requests NONE   Final   Culture     Final   Value: No Beta Hemolytic Streptococci Isolated     Performed at [facility]   Report Status 06/17/2013 FINAL   Final  GC/CHLAMYDIA PROBE AMP     Status: None   Collection Time    06/15/13  3:55 PM      Result Value Ref Range Status   CT Probe RNA NEGATIVE  NEGATIVE Final   GC Probe RNA NEGATIVE  NEGATIVE Final   Comment: (NOTE)                                                                                               **Normal Reference Range: Negative**          Assay performed using the Gen-Probe APTIMA COMBO2 (R) Assay.     Acceptable specimen types for this assay include APTIMA Swabs (Unisex,     endocervical, urethral, or vaginal), first void urine, and ThinPrep     liquid based cytology samples.     Performed at [place]     Status: None   Collection Time    06/15/13  3:56 PM      Result Value Ref Range Status   Specimen Description THROAT   Final   Special Requests Normal   Final   Culture     Final   Value:  NO GROWTH 2 DAYS     Performed at [facility]   Report Status 06/19/2013 FINAL   Final     Labs: Basic Metabolic Panel:  Recent Labs Lab 06/13/13 0540 06/14/13 0418 06/16/13 0500 06/18/13 0347 06/19/13 0345  NA 136* 137 138 139  --   K 3.9 4.0 4.8 4.4  --   CL 101 101 101 104  --   CO2 _0 --   GLUCOSE 99 99 101* 138*  --   BUN _1 --   CREATININE 0.85 0.94 0.94 0.73 0.82  CALCIUM 8.3* 9.0 9.0 8.7  --    Liver Function Tests:  Recent Labs Lab 06/13/13 0540 06/14/13 0418  AST 21  17  ALT 32 28  ALKPHOS 75 79  BILITOT 0.7 0.5  PROT 6.1 6.5  ALBUMIN 3.2* 3.2*   No results found for this basename: LIPASE, AMYLASE,  in the last 168 hours No results found for this basename: AMMONIA,  in the last 168 hours CBC:  Recent Labs Lab 06/13/13 0540 06/14/13 0418 06/16/13 0500  WBC 6.2 4.6 4.5  NEUTROABS  --  3.3  --   HGB 13.4 13.9 14.0  HCT 38.0* 40.1 40.0  MCV 86.8 87.7 87.0  PLT 127* 130* 153   Cardiac Enzymes:  Recent Labs Lab 06/16/13 1140  CKTOTAL 54   BNP: BNP (last 3 results) No results found for this basename: PROBNP,  in the last 8760 hours CBG: No results found for this basename: GLUCAP,  in the last 168 hours     Signed:  Dacen Frayre A Latrena Benegas  [facility] 06/19/2013, 2:29 PM

## 2013-06-21 LAB — MISCELLANEOUS TEST: Miscellaneous Test: NEGATIVE

## 2013-06-22 LAB — HUMAN PARVOVIRUS DNA DETECTION BY PCR: Parvovirus B19, PCR: NOT DETECTED

## 2013-06-23 ENCOUNTER — Telehealth: Payer: Self-pay | Admitting: Licensed Clinical Social Worker

## 2013-06-23 NOTE — Telephone Encounter (Signed)
Brian [name] was seen in the hospital and had questions about labs, Brian [name] has not been seen here before. Brian [name] would like Dr. Tommy Medal to call him back if possible. I returned Brian [name]'s call to get more information but he did not answer.

## 2013-06-26 ENCOUNTER — Telehealth: Payer: Self-pay | Admitting: *Deleted

## 2013-06-26 NOTE — Telephone Encounter (Signed)
I called him back on his mobile tonight and reviewed his labs. He will followup with PCP with [place] IF he has fever again

## 2013-06-26 NOTE — Telephone Encounter (Signed)
Patient left message stating he was calling for hospital lab results as directed at discharge.  Please advise. Landis Gandy, RN

## 2013-06-27 NOTE — Telephone Encounter (Signed)
Ok thank you 

## 2013-08-08 LAB — MALARIA SMEAR

## 2014-04-19 ENCOUNTER — Emergency Department (HOSPITAL_BASED_OUTPATIENT_CLINIC_OR_DEPARTMENT_OTHER): Payer: 59

## 2014-04-19 ENCOUNTER — Encounter (HOSPITAL_BASED_OUTPATIENT_CLINIC_OR_DEPARTMENT_OTHER): Payer: Self-pay

## 2014-04-19 ENCOUNTER — Emergency Department (HOSPITAL_BASED_OUTPATIENT_CLINIC_OR_DEPARTMENT_OTHER)
Admission: EM | Admit: 2014-04-19 | Discharge: 2014-04-19 | Disposition: A | Discharge status: Home / Self Care | Payer: 59 | Attending: Emergency Medicine | Admitting: Emergency Medicine

## 2014-04-19 DIAGNOSIS — Z79899 Other long term (current) drug therapy: Secondary | ICD-10-CM | POA: Insufficient documentation

## 2014-04-19 DIAGNOSIS — R0602 Shortness of breath: Secondary | ICD-10-CM | POA: Diagnosis not present

## 2014-04-19 DIAGNOSIS — Z87442 Personal history of urinary calculi: Secondary | ICD-10-CM | POA: Insufficient documentation

## 2014-04-19 DIAGNOSIS — Z7952 Long term (current) use of systemic steroids: Secondary | ICD-10-CM | POA: Insufficient documentation

## 2014-04-19 DIAGNOSIS — Z8619 Personal history of other infectious and parasitic diseases: Secondary | ICD-10-CM | POA: Insufficient documentation

## 2014-04-19 DIAGNOSIS — R630 Anorexia: Secondary | ICD-10-CM | POA: Diagnosis not present

## 2014-04-19 DIAGNOSIS — R5383 Other fatigue: Secondary | ICD-10-CM | POA: Insufficient documentation

## 2014-04-19 DIAGNOSIS — R079 Chest pain, unspecified: Secondary | ICD-10-CM | POA: Diagnosis present

## 2014-04-19 LAB — CBC WITH DIFFERENTIAL/PLATELET
BASOS PCT: 0 % (ref 0–1)
Basophils Absolute: 0 10*3/uL (ref 0.0–0.1)
EOS ABS: 0 10*3/uL (ref 0.0–0.7)
Eosinophils Relative: 1 % (ref 0–5)
HEMATOCRIT: 44.4 % (ref 39.0–52.0)
HEMOGLOBIN: 15.6 g/dL (ref 13.0–17.0)
LYMPHS ABS: 1.8 10*3/uL (ref 0.7–4.0)
Lymphocytes Relative: 32 % (ref 12–46)
MCH: 29.7 pg (ref 26.0–34.0)
MCHC: 35.1 g/dL (ref 30.0–36.0)
MCV: 84.4 fL (ref 78.0–100.0)
MONOS PCT: 10 % (ref 3–12)
Monocytes Absolute: 0.6 10*3/uL (ref 0.1–1.0)
Neutro Abs: 3.2 10*3/uL (ref 1.7–7.7)
Neutrophils Relative %: 57 % (ref 43–77)
Platelets: 196 10*3/uL (ref 150–400)
RBC: 5.26 MIL/uL (ref 4.22–5.81)
RDW: 12.5 % (ref 11.5–15.5)
WBC: 5.7 10*3/uL (ref 4.0–10.5)

## 2014-04-19 LAB — MAGNESIUM: Magnesium: 1.8 mg/dL (ref 1.5–2.5)

## 2014-04-19 LAB — BASIC METABOLIC PANEL
Anion gap: 4 — ABNORMAL LOW (ref 5–15)
BUN: 17 mg/dL (ref 6–23)
CO2: 28 mmol/L (ref 19–32)
Calcium: 9.1 mg/dL (ref 8.4–10.5)
Chloride: 104 mmol/L (ref 96–112)
Creatinine, Ser: 0.92 mg/dL (ref 0.50–1.35)
Glucose, Bld: 93 mg/dL (ref 70–99)
POTASSIUM: 3.9 mmol/L (ref 3.5–5.1)
Sodium: 136 mmol/L (ref 135–145)

## 2014-04-19 LAB — TROPONIN I

## 2014-04-19 MED ORDER — IBUPROFEN 800 MG PO TABS
800.0000 mg | ORAL_TABLET | Freq: Three times a day (TID) | ORAL | Status: DC | PRN
Start: 2014-04-19 — End: 2015-04-30

## 2014-04-19 MED ORDER — ASPIRIN 81 MG PO CHEW
324.0000 mg | CHEWABLE_TABLET | Freq: Once | ORAL | Status: AC
  Administered 2014-04-19: 324 mg via ORAL
  Filled 2014-04-19: qty 4

## 2014-04-19 MED ORDER — KETOROLAC TROMETHAMINE 30 MG/ML IJ SOLN
30.0000 mg | Freq: Once | INTRAMUSCULAR | Status: AC
  Administered 2014-04-19: 30 mg via INTRAVENOUS
  Filled 2014-04-19: qty 1

## 2014-04-19 MED ORDER — HYDROCODONE-ACETAMINOPHEN 5-325 MG PO TABS: 1.0000 | ORAL_TABLET | ORAL | Status: DC | PRN

## 2014-04-19 NOTE — Discharge Instructions (Signed)
Read the information below.  Use the prescribed medication as directed.  Please discuss all new medications with your pharmacist.  Do not take additional tylenol while taking the prescribed pain medication to avoid overdose.  You may return to the Emergency Department at any time for worsening condition or any new symptoms that concern you.  If you develop worsening chest pain, shortness of breath, fever, you pass out, or become weak or dizzy, return to the ER for a recheck.      Chest Pain (Nonspecific) It is often hard to give a specific diagnosis for the cause of chest pain. There is always a chance that your pain could be related to something serious, such as a heart attack or a blood clot in the lungs. You need to follow up with your health care provider for further evaluation. CAUSES   Heartburn.  Pneumonia or bronchitis.  Anxiety or stress.  Inflammation around your heart (pericarditis) or lung (pleuritis or pleurisy).  A blood clot in the lung.  A collapsed lung (pneumothorax). It can develop suddenly on its own (spontaneous pneumothorax) or from trauma to the chest.  Shingles infection (herpes zoster virus). The chest wall is composed of bones, muscles, and cartilage. Any of these can be the source of the pain.  The bones can be bruised by injury.  The muscles or cartilage can be strained by coughing or overwork.  The cartilage can be affected by inflammation and become sore (costochondritis). DIAGNOSIS  Lab tests or other studies may be needed to find the cause of your pain. Your health care provider may have you take a test called an ambulatory electrocardiogram (ECG). An ECG records your heartbeat patterns over a 24-hour period. You may also have other tests, such as:  Transthoracic echocardiogram (TTE). During echocardiography, sound waves are used to evaluate how blood flows through your heart.  Transesophageal echocardiogram (TEE).  Cardiac monitoring. This allows  your health care provider to monitor your heart rate and rhythm in real time.  Holter monitor. This is a portable device that records your heartbeat and can help diagnose heart arrhythmias. It allows your health care provider to track your heart activity for several days, if needed.  Stress tests by exercise or by giving medicine that makes the heart beat faster. TREATMENT   Treatment depends on what may be causing your chest pain. Treatment may include:  Acid blockers for heartburn.  Anti-inflammatory medicine.  Pain medicine for inflammatory conditions.  Antibiotics if an infection is present.  You may be advised to change lifestyle habits. This includes stopping smoking and avoiding alcohol, caffeine, and chocolate.  You may be advised to keep your head raised (elevated) when sleeping. This reduces the chance of acid going backward from your stomach into your esophagus. Most of the time, nonspecific chest pain will improve within 2-3 days with rest and mild pain medicine.  HOME CARE INSTRUCTIONS   If antibiotics were prescribed, take them as directed. Finish them even if you start to feel better.  For the next few days, avoid physical activities that bring on chest pain. Continue physical activities as directed.  Do not use any tobacco products, including cigarettes, chewing tobacco, or electronic cigarettes.  Avoid drinking alcohol.  Only take medicine as directed by your health care provider.  Follow your health care provider's suggestions for further testing if your chest pain does not go away.  Keep any follow-up appointments you made. If you do not go to an appointment, you could  develop lasting (chronic) problems with pain. If there is any problem keeping an appointment, call to reschedule. SEEK MEDICAL CARE IF:   Your chest pain does not go away, even after treatment.  You have a rash with blisters on your chest.  You have a fever. SEEK IMMEDIATE MEDICAL CARE IF:     You have increased chest pain or pain that spreads to your arm, neck, jaw, back, or abdomen.  You have shortness of breath.  You have an increasing cough, or you cough up blood.  You have severe back or abdominal pain.  You feel nauseous or vomit.  You have severe weakness.  You faint.  You have chills. This is an emergency. Do not wait to see if the pain will go away. Get medical help at once. Call your local emergency services (911 in [place] Do not drive yourself to the hospital. MAKE SURE YOU:   Understand these instructions.  Will watch your condition.  Will get help right away if you are not doing well or get worse. Document Released: 11/12/2004 Document Revised: 02/07/2013 Document Reviewed: 09/08/2007 [facility] Patient Information 2015 [place], [place]. This information is not intended to replace advice given to you by your health care provider. Make sure you discuss any questions you have with your health care provider.

## 2014-04-19 NOTE — ED Notes (Signed)
Pt reports chest pain x 2 days. Reports pressure and squeezing feeling. No radiation. Reports more pressure while standing.

## 2014-04-19 NOTE — ED Provider Notes (Signed)
CSN: [id number]     Arrival date & time 04/19/14  1603 History   First MD Initiated Contact with Patient 04/19/14 1607     Chief Complaint  Patient presents with  . Chest Pain     (Consider location/radiation/quality/duration/timing/severity/associated sxs/prior Treatment) The history is provided by the patient and medical records.     Pt presents with central chest pain and associated decreased energy, some SOB.  States he developed central chest pain and pressure, constantly 2/10 intensity with exacerbation while walking around and working (works as an EMT in the ED).  States he "feels drained" and has had decreased appetite.  Denies lightheadedness/dizziness, nausea, chills/fever, myalgias, abdominal pain, leg swelling.  Mother had a triple bypass in her 73s.   Has taken ibuprofen and tums without improvement.   Past Medical History  Diagnosis Date  . Rickettsia infection   . Kidney stone    Past Surgical History  Procedure Laterality Date  . Cholecystectomy     Family History  Problem Relation Age of Onset  . Diabetes Mother   . Heart disease Mother   . Heart failure Mother   . Heart attack Mother   . Hyperlipidemia Mother   . Hypertension Mother   . Hyperlipidemia Father   . Sudden death Neg Hx    History  Substance Use Topics  . Smoking status: Never Smoker   . Smokeless tobacco: Not on file  . Alcohol Use: No    Review of Systems  All other systems reviewed and are negative.     Allergies  Doxycycline and Guaifenesin & derivatives  Home Medications   Prior to Admission medications   Medication Sig Start Date End Date Taking? Authorizing Provider  buPROPion (WELLBUTRIN XL) 300 MG 24 hr tablet Take 300 mg by mouth daily.    Historical Provider, MD  feeding supplement, ENSURE COMPLETE, (ENSURE COMPLETE) LIQD Take 237 mLs by mouth 3 (three) times daily with meals. 06/19/13   Belkys A Regalado, MD  fluconazole (DIFLUCAN) 100 MG tablet Take 1 tablet (100 mg  total) by mouth daily. 06/19/13   Belkys A Regalado, MD  predniSONE (STERAPRED UNI-PAK) 10 MG tablet Take by mouth daily. Take 4 tablets for 2 days then 3 tablets for one day then 1 tablet then stop. 06/19/13   Belkys A Regalado, MD   There were no vitals taken for this visit. Physical Exam  Constitutional: He appears well-developed and well-nourished. No distress.  HENT:  Head: Normocephalic and atraumatic.  Neck: Neck supple.  Cardiovascular: Normal rate, regular rhythm, normal heart sounds and intact distal pulses.   Pulmonary/Chest: Effort normal and breath sounds normal. No respiratory distress. He has no wheezes. He has no rales.  Abdominal: Soft. He exhibits no distension and no mass. There is no tenderness. There is no rebound and no guarding.  Musculoskeletal: He exhibits no edema.  Neurological: He is alert. He exhibits normal muscle tone.  Skin: He is not diaphoretic.  Nursing note and vitals reviewed.   ED Course  Procedures (including critical care time) Labs Review Labs Reviewed  BASIC METABOLIC PANEL - Abnormal; Notable for the following:    Anion gap 4 (*)    All other components within normal limits  TROPONIN I  CBC WITH DIFFERENTIAL/PLATELET  TROPONIN I  MAGNESIUM  TESTOSTERONE    Imaging Review Dg Chest 2 View  04/19/2014   CLINICAL DATA:  Chest pain  EXAM: CHEST  2 VIEW  COMPARISON:  June 13, 2013  FINDINGS: There  is slight scarring in the left base. Lungs elsewhere are clear. Heart size and pulmonary vascularity are normal. No adenopathy. No pneumothorax. There is mild degenerative change in the thoracic spine. There is an azygos lobe on the right, an anatomic variant.  IMPRESSION: Slight scarring left base.  No edema or consolidation.   Electronically Signed   By: Lowella Grip III M.D.   On: 04/19/2014 16:55     EKG Interpretation   Date/Time:  Thursday April 19 2014 16:09:58 EST Ventricular Rate:  75 PR Interval:  140 QRS Duration: 86 QT Interval:   384 QTC Calculation: 428 R Axis:   40 Text Interpretation:  Normal sinus rhythm Normal ECG rate is slower but  otherwise unchanged compared to April 2015 Confirmed by Regenia Skeeter  MD,  SCOTT [phone]) on 04/19/2014 4:11:34 PM       5:27 PM Discussed with Dr Regenia Skeeter who will also see the patient.  MDM   Final diagnoses:  Chest pain, unspecified chest pain type    Afebrile, nontoxic patient with central chest pain, constant x 2 days, worse with exertion.  Also with fatigue and anorexia.  EKG unremarkable.  CXR without acute changes, scarring at left base.  Labs unremarkable including two troponins.   PERC negative.  HEART score 2, low risk. Pt also seen and examined by Dr Regenia Skeeter.  D/C home with PCP follow up tomorrow, referral to cardiology.  Pt requested magnesium and testosterone levels to be drawn for PCP follow up tomorrow. D/C home with ibuprofen, norco. Discussed result, findings, treatment, and follow up  with patient.  Pt given return precautions.  Pt verbalizes understanding and agrees with plan.         Clayton Bibles, PA-C 04/19/14 2113  Sherwood Gambler, MD 04/24/14 [phone]

## 2014-04-20 LAB — TESTOSTERONE: Testosterone: 219 ng/dL — ABNORMAL LOW (ref 300–890)

## 2014-05-08 DIAGNOSIS — K219 Gastro-esophageal reflux disease without esophagitis: Secondary | ICD-10-CM | POA: Insufficient documentation

## 2014-12-05 ENCOUNTER — Other Ambulatory Visit (HOSPITAL_COMMUNITY): Payer: Self-pay | Admitting: Orthopaedic Surgery

## 2014-12-05 DIAGNOSIS — M25562 Pain in left knee: Secondary | ICD-10-CM

## 2014-12-08 ENCOUNTER — Ambulatory Visit (HOSPITAL_BASED_OUTPATIENT_CLINIC_OR_DEPARTMENT_OTHER)
Admission: RE | Admit: 2014-12-08 | Discharge: 2014-12-08 | Disposition: A | Discharge status: Home / Self Care | Payer: 59 | Source: Ambulatory Visit | Attending: Orthopaedic Surgery | Admitting: Orthopaedic Surgery

## 2014-12-08 DIAGNOSIS — M2352 Chronic instability of knee, left knee: Secondary | ICD-10-CM | POA: Diagnosis not present

## 2014-12-08 DIAGNOSIS — M1990 Unspecified osteoarthritis, unspecified site: Secondary | ICD-10-CM | POA: Insufficient documentation

## 2014-12-08 DIAGNOSIS — M25562 Pain in left knee: Secondary | ICD-10-CM | POA: Diagnosis present

## 2014-12-08 DIAGNOSIS — M7989 Other specified soft tissue disorders: Secondary | ICD-10-CM | POA: Insufficient documentation

## 2014-12-10 ENCOUNTER — Ambulatory Visit (HOSPITAL_COMMUNITY): Payer: 59

## 2014-12-15 ENCOUNTER — Other Ambulatory Visit (HOSPITAL_BASED_OUTPATIENT_CLINIC_OR_DEPARTMENT_OTHER): Payer: 59

## 2015-04-10 DIAGNOSIS — Z91018 Allergy to other foods: Secondary | ICD-10-CM | POA: Diagnosis not present

## 2015-04-10 DIAGNOSIS — M2550 Pain in unspecified joint: Secondary | ICD-10-CM | POA: Diagnosis not present

## 2015-04-10 DIAGNOSIS — J320 Chronic maxillary sinusitis: Secondary | ICD-10-CM | POA: Diagnosis not present

## 2015-04-11 DIAGNOSIS — M7989 Other specified soft tissue disorders: Secondary | ICD-10-CM | POA: Diagnosis not present

## 2015-04-11 DIAGNOSIS — M79651 Pain in right thigh: Secondary | ICD-10-CM | POA: Diagnosis not present

## 2015-04-15 DIAGNOSIS — M79659 Pain in unspecified thigh: Secondary | ICD-10-CM | POA: Diagnosis not present

## 2015-04-15 DIAGNOSIS — M25561 Pain in right knee: Secondary | ICD-10-CM | POA: Diagnosis not present

## 2015-04-15 DIAGNOSIS — M25551 Pain in right hip: Secondary | ICD-10-CM | POA: Diagnosis not present

## 2015-04-30 ENCOUNTER — Ambulatory Visit (INDEPENDENT_AMBULATORY_CARE_PROVIDER_SITE_OTHER): Payer: 59 | Admitting: Allergy and Immunology

## 2015-04-30 ENCOUNTER — Encounter: Payer: Self-pay | Admitting: Allergy and Immunology

## 2015-04-30 VITALS — BP 134/90 | HR 60 | Temp 97.6°F | Resp 16 | Ht 70.47 in | Wt 269.0 lb

## 2015-04-30 DIAGNOSIS — Z91018 Allergy to other foods: Secondary | ICD-10-CM | POA: Diagnosis not present

## 2015-04-30 DIAGNOSIS — H1010 Acute atopic conjunctivitis, unspecified eye: Secondary | ICD-10-CM

## 2015-04-30 DIAGNOSIS — J309 Allergic rhinitis, unspecified: Secondary | ICD-10-CM

## 2015-04-30 DIAGNOSIS — Z77098 Contact with and (suspected) exposure to other hazardous, chiefly nonmedicinal, chemicals: Secondary | ICD-10-CM | POA: Diagnosis not present

## 2015-04-30 DIAGNOSIS — T7800XA Anaphylactic reaction due to unspecified food, initial encounter: Secondary | ICD-10-CM | POA: Diagnosis not present

## 2015-04-30 MED ORDER — EPINEPHRINE 0.3 MG/0.3ML IJ SOAJ: INTRAMUSCULAR | Status: DC

## 2015-04-30 MED ORDER — MOMETASONE FUROATE 50 MCG/ACT NA SUSP: NASAL | Status: DC

## 2015-04-30 MED ORDER — MONTELUKAST SODIUM 10 MG PO TABS: ORAL_TABLET | ORAL | Status: DC

## 2015-04-30 NOTE — Patient Instructions (Signed)
  1. Allergen avoidance measures  2. Treat and prevent inflammation:   A. montelukast 10 mg one tablet one time per day  B. Nasonex 1-2 sprays each nostril one time per day  3. If needed:   A. EpiPen, Benadryl, M.D. ER evaluation for allergic reaction  B. OTC antihistamine  4. Will need pretreatment for RCM exposure  5. Blood - horseradish immunocap, bean panel  6. Further evaluation?  7. Return to clinic summer 2017 or earlier if problem

## 2015-04-30 NOTE — Progress Notes (Signed)
Dear Dr. Prudy Feeler,  Thank you for referring Brian [name] to the [facility] on 04/30/2015.   Below is a summation of this patient's evaluation and recommendations.  Thank you for your referral. I will keep you informed about this patient's response to treatment.   If you have any questions please to do hestitate to contact me.   Sincerely,  Jiles Prows, MD [facility]   ______________________________________________________________________    NEW PATIENT NOTE  Referring Provider: Sharyne Richters, MD Primary Provider: Sharyne Richters, MD Date of office visit: 04/30/2015    Subjective:   Chief Complaint:  Brian [name] (DOB: 1975/07/28) is a 40 y.o. male with a chief complaint of Allergies  who presents to the clinic on 04/30/2015 with the following problems:  HPI Comments: Brian [name] presents this clinic in evaluation of 4 distinct issues.  First, Brian [name] has a two-year history of seasonal allergic rhinitis usually following exposure to mow grass or dust or pollen or mold with a flare during the spring and some flare during the fall. Winter he does quite well without significant problem involving his nose or eyes. He does have nasal congestion and sneezing and some itchy red watery eyes. He's tried nasal steroids and antihistamines which resulted in some control these issues.  Second, Brian [name] has had problems with food consumption. He develops very significant abdominal pain and explosive diarrhea and nausea within 1 hour of eating various beans like pinto beans and dark means. Sometimes he gets shortness of breath with these episodes but has no skin issue. 3 weeks ago he ate chicken and let us and a horseradish dip. Within seconds of eating horseradish she developed a burning sensation across his mouth and lip. He subtotally developed a blistering dermatitis within the next day including multiple blisters  within his mouth and on his lip. He had significant lip swelling associated with that issue and interestingly also had some issues with diffuse joint pain. Apparently lab tests were performed which identified a negative a N/A and a negative rheumatoid factor but an increased CRP at 7.3. He was treated with antibiotics and prednisone and Magic mouthwash with lidocaine and his reaction resolved within several days. Interestingly, he was admitted 2 years ago for an episode of oral swelling and [facility] with unknown etiology believed to be viral.  Third, Brian [name] is developed very significant lip swelling after exposure to MRI dye. He had no other associated systemic or constitutional symptoms.  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   Past Medical History  Diagnosis Date  . Rickettsia infection   . Kidney stone     Past Surgical History  Procedure Laterality Date  . Cholecystectomy      Current outpatient prescriptions:  .  testosterone enanthate (DELATESTRYL) 200 MG/ML injection, Inject 200 mg into the muscle once a week. For IM use only, Disp: , Rfl:   Allergies  Allergen Reactions  . Gadolinium Derivatives Hives    Patient had MRI scan at [place]. Patient called one hour after he left imaging facility to report two "blisters" that came up on "back" of lip.   Marland Kitchen Doxycycline Other (See Comments)    "heart arrythmia" and "dyspepsia"  .  Guaifenesin & Derivatives Other (See Comments)    Reaction unknown    Review of systems negative except as noted in HPI / PMHx or noted below:  Review of Systems  Constitutional: Negative.   HENT: Negative.   Eyes: Negative.   Respiratory: Negative.   Cardiovascular: Negative.     Gastrointestinal: Negative.   Genitourinary: Negative.   Musculoskeletal: Negative.   Skin: Negative.   Neurological: Negative.   Endo/Heme/Allergies: Negative.   Psychiatric/Behavioral: Negative.     Family History  Problem Relation Age of Onset  . Diabetes Mother   . Heart disease Mother   . Heart failure Mother   . Heart attack Mother   . Hyperlipidemia Mother   . Hypertension Mother   . Hyperlipidemia Father   . Allergic rhinitis Father   . Rashes / Skin problems Father   . Sudden death Neg Hx     Social History   Social History  . Marital Status: Married    Spouse Name: N/A  . Number of Children: N/A  . Years of Education: N/A   Occupational History  . Not on file.   Social History Main Topics  . Smoking status: Never Smoker   . Smokeless tobacco: Not on file  . Alcohol Use: No  . Drug Use: No  . Sexual Activity: Not on file   Other Topics Concern  . Not on file   Social History Narrative    Environmental and Social history  Lives in a house with a dry environment, carpeting located inside the bedroom, dogs located inside the household, no plastic on the better pillow, and no smoking ongoing with inside the household. He works in the emergency room as an EMT.   Objective:   Filed Vitals:   04/30/15 0930  BP: 134/90  Pulse: 60  Temp: 97.6 F (36.4 C)  Resp: 16   Height: 5' 10.47" (179 cm) Weight: 268 lb 15.4 oz (122 kg)  Physical Exam  Constitutional: He is well-developed, well-nourished, and in no distress.  HENT:  Head: Normocephalic. Head is without right periorbital erythema and without left periorbital erythema.  Right Ear: Tympanic membrane, external ear and ear canal normal.  Left Ear: Tympanic membrane, external ear and ear canal normal.  Nose: Nose normal. No mucosal edema or rhinorrhea.  Mouth/Throat: Oropharynx is clear and moist and mucous membranes are normal. No oropharyngeal exudate.  Eyes: Conjunctivae and lids are normal.  Pupils are equal, round, and reactive to light.  Neck: Trachea normal. No tracheal deviation present. No thyromegaly present.  Cardiovascular: Normal rate, regular rhythm, S1 normal, S2 normal and normal heart sounds.   No murmur heard. Pulmonary/Chest: Effort normal. No stridor. No tachypnea. No respiratory distress. He has no wheezes. He has no rales. He exhibits no tenderness.  Abdominal: Soft. He exhibits no distension and no mass. There is no hepatosplenomegaly. There is no tenderness. There is no rebound and no guarding.  Musculoskeletal: He exhibits no edema or tenderness.  Lymphadenopathy:       Head (right side): No tonsillar adenopathy present.       Head (left side): No tonsillar adenopathy present.    He has no cervical adenopathy.    He has no axillary adenopathy.  Neurological: He is alert. Gait normal.  Skin: No rash noted. He is not diaphoretic. No erythema. No pallor. Nails show no clubbing.  Psychiatric: Mood and affect normal.     Diagnostics: Allergy skin tests were performed. He demonstrated hypersensitivity against grasses, trees,  ragweed, and molds. He did not demonstrate any hypersensitivity against a screening panel of foods.   Assessment and Plan:    1. Allergic rhinoconjunctivitis   2. Food allergy   3. History of exposure to radiocontrast medium     1. Allergen avoidance measures  2. Treat and prevent inflammation:   A. montelukast 10 mg one tablet one time per day  B. Nasonex 1-2 sprays each nostril one time per day  3. If needed:   A. EpiPen, Benadryl, M.D. ER evaluation for allergic reaction  B. OTC antihistamine  4. Will need pretreatment for RCM exposure  5. Blood - horseradish immunocap, bean panel  6. Further evaluation?  7. Return to clinic summer 2017 or earlier if problem  Brian [name] is very atopic and hopefully with attention allergen avoidance measures in the use of anti-inflammatory medications for his respiratory tract he'll do  quite well. We'll see what happens as we go through this upcoming next several months. We'll work through his possible food allergy by checking blood tests as mentioned above. He would be a candidate for immunotherapy should he fail medical treatment. As well, he will need some type of prednisone based pretreatment for any future RCM exposure and I have informed him about that requirement.   Jiles Prows, MD [facility]

## 2015-05-14 ENCOUNTER — Encounter: Payer: Self-pay | Admitting: *Deleted

## 2015-06-05 DIAGNOSIS — Z636 Dependent relative needing care at home: Secondary | ICD-10-CM | POA: Diagnosis not present

## 2015-06-05 DIAGNOSIS — E291 Testicular hypofunction: Secondary | ICD-10-CM | POA: Diagnosis not present

## 2015-06-05 DIAGNOSIS — B2790 Infectious mononucleosis, unspecified without complication: Secondary | ICD-10-CM | POA: Diagnosis not present

## 2015-06-05 DIAGNOSIS — Z125 Encounter for screening for malignant neoplasm of prostate: Secondary | ICD-10-CM | POA: Diagnosis not present

## 2015-06-11 DIAGNOSIS — R309 Painful micturition, unspecified: Secondary | ICD-10-CM | POA: Diagnosis not present

## 2015-06-11 DIAGNOSIS — M2550 Pain in unspecified joint: Secondary | ICD-10-CM | POA: Diagnosis not present

## 2015-06-11 DIAGNOSIS — Z79899 Other long term (current) drug therapy: Secondary | ICD-10-CM | POA: Diagnosis not present

## 2015-06-11 DIAGNOSIS — M79641 Pain in right hand: Secondary | ICD-10-CM | POA: Diagnosis not present

## 2015-06-11 DIAGNOSIS — M79642 Pain in left hand: Secondary | ICD-10-CM | POA: Diagnosis not present

## 2015-06-11 DIAGNOSIS — M25562 Pain in left knee: Secondary | ICD-10-CM | POA: Diagnosis not present

## 2015-06-11 DIAGNOSIS — M545 Low back pain: Secondary | ICD-10-CM | POA: Diagnosis not present

## 2015-06-11 DIAGNOSIS — M25561 Pain in right knee: Secondary | ICD-10-CM | POA: Diagnosis not present

## 2015-06-11 DIAGNOSIS — R5381 Other malaise: Secondary | ICD-10-CM | POA: Diagnosis not present

## 2015-06-11 DIAGNOSIS — E559 Vitamin D deficiency, unspecified: Secondary | ICD-10-CM | POA: Diagnosis not present

## 2015-07-25 DIAGNOSIS — R5383 Other fatigue: Secondary | ICD-10-CM | POA: Diagnosis not present

## 2015-07-25 DIAGNOSIS — M79641 Pain in right hand: Secondary | ICD-10-CM | POA: Diagnosis not present

## 2015-07-25 DIAGNOSIS — M79671 Pain in right foot: Secondary | ICD-10-CM | POA: Diagnosis not present

## 2015-07-25 DIAGNOSIS — R600 Localized edema: Secondary | ICD-10-CM | POA: Diagnosis not present

## 2015-08-07 DIAGNOSIS — M79641 Pain in right hand: Secondary | ICD-10-CM | POA: Diagnosis not present

## 2015-08-07 DIAGNOSIS — M79642 Pain in left hand: Secondary | ICD-10-CM | POA: Diagnosis not present

## 2015-08-15 DIAGNOSIS — G4730 Sleep apnea, unspecified: Secondary | ICD-10-CM | POA: Diagnosis not present

## 2015-08-15 DIAGNOSIS — R0683 Snoring: Secondary | ICD-10-CM | POA: Diagnosis not present

## 2015-08-15 DIAGNOSIS — J342 Deviated nasal septum: Secondary | ICD-10-CM | POA: Diagnosis not present

## 2015-08-15 DIAGNOSIS — J343 Hypertrophy of nasal turbinates: Secondary | ICD-10-CM | POA: Diagnosis not present

## 2015-09-19 DIAGNOSIS — D751 Secondary polycythemia: Secondary | ICD-10-CM | POA: Diagnosis not present

## 2015-09-19 DIAGNOSIS — E291 Testicular hypofunction: Secondary | ICD-10-CM | POA: Diagnosis not present

## 2015-09-20 DIAGNOSIS — E291 Testicular hypofunction: Secondary | ICD-10-CM | POA: Diagnosis not present

## 2015-09-20 DIAGNOSIS — N529 Male erectile dysfunction, unspecified: Secondary | ICD-10-CM | POA: Diagnosis not present

## 2015-09-20 DIAGNOSIS — D751 Secondary polycythemia: Secondary | ICD-10-CM | POA: Diagnosis not present

## 2015-10-11 DIAGNOSIS — E291 Testicular hypofunction: Secondary | ICD-10-CM | POA: Diagnosis not present

## 2015-12-03 ENCOUNTER — Encounter: Payer: Self-pay | Admitting: *Deleted

## 2015-12-03 ENCOUNTER — Emergency Department
Admission: EM | Admit: 2015-12-03 | Discharge: 2015-12-03 | Disposition: A | Discharge status: Home / Self Care | Payer: 59 | Source: Home / Self Care | Attending: Family Medicine | Admitting: Family Medicine

## 2015-12-03 ENCOUNTER — Emergency Department (INDEPENDENT_AMBULATORY_CARE_PROVIDER_SITE_OTHER): Payer: 59

## 2015-12-03 DIAGNOSIS — S93602A Unspecified sprain of left foot, initial encounter: Secondary | ICD-10-CM

## 2015-12-03 DIAGNOSIS — M25572 Pain in left ankle and joints of left foot: Secondary | ICD-10-CM | POA: Diagnosis not present

## 2015-12-03 DIAGNOSIS — M79672 Pain in left foot: Secondary | ICD-10-CM | POA: Diagnosis not present

## 2015-12-03 NOTE — ED Triage Notes (Signed)
Pt c/o left lateral and plantar foot pain x 2 1/2 weeks after mis stepping and bearing all of his weight on the outside of the left foot. No previous injuries. Taking Naproxen @ home.

## 2015-12-03 NOTE — ED Provider Notes (Signed)
CSN: [id number]     Arrival date & time 12/03/15  0845 History   First MD Initiated Contact with Patient 12/03/15 0920     Chief Complaint  Patient presents with  . Foot Pain   (Consider location/radiation/quality/duration/timing/severity/associated sxs/prior Treatment) HPI  Brian [name] is a 40 y.o. male presenting to UC with c/o Left foot pain that is aching and sore cross distal metatarsals and ball of foot for about 2 weeks after stepping down onto a step. Denies rolling his foot or ankle.  Pain is 4-5/10.  He has been busy with work and family but is finally off today and wanted to get it checked out.  He has had acetaminophen and naproxen with moderate relief.  No other injuries. No prior injury or surgery to Left foot.   Past Medical History:  Diagnosis Date  . Kidney stone   . Rickettsia infection    Past Surgical History:  Procedure Laterality Date  . CHOLECYSTECTOMY     Family History  Problem Relation Age of Onset  . Diabetes Mother   . Heart disease Mother   . Heart failure Mother   . Heart attack Mother   . Hyperlipidemia Mother   . Hypertension Mother   . Hyperlipidemia Father   . Allergic rhinitis Father   . Rashes / Skin problems Father   . Sudden death Neg Hx    Social History  Substance Use Topics  . Smoking status: Never Smoker  . Smokeless tobacco: Never Used  . Alcohol use No    Review of Systems  Musculoskeletal: Positive for arthralgias, gait problem and myalgias. Negative for back pain and joint swelling.  Skin: Negative for color change, rash and wound.  Neurological: Negative for weakness and numbness.    Allergies  Gadolinium derivatives; Doxycycline; and Guaifenesin & derivatives  Home Medications   Prior to Admission medications   Medication Sig Start Date End Date Taking? Authorizing Provider  EPINEPHrine (EPIPEN 2-PAK) 0.3 mg/0.3 mL IJ SOAJ injection USE AS DIRECTED FOR LIFE THREATENING ALLERGIC REACTIONS 04/30/15   Jiles Prows,  MD  mometasone (NASONEX) 50 MCG/ACT nasal spray USE 1-2 SPRAYS IN [facility] NOSTRIL ONCE DAILY 04/30/15   Jiles Prows, MD  montelukast (SINGULAIR) 10 MG tablet TAKE ONE TABLET ONCE DAILY 04/30/15   Jiles Prows, MD  testosterone enanthate (DELATESTRYL) 200 MG/ML injection Inject 200 mg into the muscle once a week. For IM use only    Historical Provider, MD   Meds Ordered and Administered this Visit  Medications - No data to display  BP 122/83 (BP Location: Left Arm)   Pulse 76   Temp 97.6 F (36.4 C) (Oral)   Wt 262 lb (118.8 kg)   SpO2 99%   BMI 37.09 kg/m  No data found.   Physical Exam  Constitutional: He is oriented to person, place, and time. He appears well-developed and well-nourished.  HENT:  Head: Normocephalic and atraumatic.  Eyes: EOM are normal.  Neck: Normal range of motion.  Cardiovascular: Normal rate.   Pulmonary/Chest: Effort normal.  Musculoskeletal: Normal range of motion. He exhibits tenderness. He exhibits no edema.  Left foot: no edema or deformity. Tenderness to dorsal aspect of distal metatarsals and ball of foot.  Full ROM ankle and toes.   Neurological: He is alert and oriented to person, place, and time.  Skin: Skin is warm and dry. Capillary refill takes less than 2 seconds. No rash noted. No erythema.  Left foot: skin in tact.  No ecchymosis or erythema.   Psychiatric: He has a normal mood and affect. His behavior is normal.  Nursing note and vitals reviewed.   Urgent Care Course   Clinical Course    Procedures (including critical care time)  Labs Review Labs Reviewed - No data to display  Imaging Review Dg Foot Complete Left  Result Date: 12/03/2015 CLINICAL DATA:  Left foot pain.  No known injury. EXAM: LEFT FOOT - COMPLETE 3+ VIEW COMPARISON:  None. FINDINGS: No acute bony or joint abnormality identified. No evidence of acute fracture or dislocation. Slight deformity noted of the base of the left third metatarsal may be related to prior  injury . IMPRESSION: No acute abnormality identified. Electronically Signed   By: Marcello Moores  Register   On: 12/03/2015 09:53     MDM   1. Sprain of left foot, initial encounter    Pt c/o Left foot pain for about 2 weeks.  Tenderness over distal metatarsals of Left foot.  Plain films: Negative for acute findings.  Recommended rest, ice, compression, and elevation. May continue naproxen and acetaminophen.       Noland Fordyce, PA-C 12/03/15 1006

## 2016-02-04 ENCOUNTER — Encounter: Payer: Self-pay | Admitting: Family Medicine

## 2016-02-04 ENCOUNTER — Ambulatory Visit (INDEPENDENT_AMBULATORY_CARE_PROVIDER_SITE_OTHER): Payer: 59 | Admitting: Family Medicine

## 2016-02-04 DIAGNOSIS — M79672 Pain in left foot: Secondary | ICD-10-CM | POA: Diagnosis not present

## 2016-02-06 DIAGNOSIS — M79672 Pain in left foot: Secondary | ICD-10-CM | POA: Insufficient documentation

## 2016-02-06 NOTE — Progress Notes (Signed)
PCP: Sharyne Richters, MD  Subjective:   HPI: Patient is a 40 y.o. male here for left foot pain.  Patient reports for about 3 months he's had pain left foot. Heard a loud pop/snap when taking a step back when this started. Pain is 6/10 and sharp, worse with walking and by end of day. Gets swelling, burning. Radiographs were negative for fracture. Pain is constant. No skin changes, numbness.  Past Medical History:  Diagnosis Date  . Kidney stone   . Rickettsia infection     Current Outpatient Prescriptions on File Prior to Visit  Medication Sig Dispense Refill  . EPINEPHrine (EPIPEN 2-PAK) 0.3 mg/0.3 mL IJ SOAJ injection USE AS DIRECTED FOR LIFE THREATENING ALLERGIC REACTIONS 4 Device 3  . mometasone (NASONEX) 50 MCG/ACT nasal spray USE 1-2 SPRAYS IN EACH NOSTRIL ONCE DAILY 17 g 5  . montelukast (SINGULAIR) 10 MG tablet TAKE ONE TABLET ONCE DAILY 30 tablet 5  . testosterone enanthate (DELATESTRYL) 200 MG/ML injection Inject 200 mg into the muscle once a week. For IM use only     No current facility-administered medications on file prior to visit.     Past Surgical History:  Procedure Laterality Date  . CHOLECYSTECTOMY      Allergies  Allergen Reactions  . Ibuprofen Itching  . Pantoprazole Itching  . Tramadol Hives and Itching  . Gadobutrol Hives    Patient had MRI scan at [place]. Patient called one hour after he left imaging facility to report two "blisters" that came up on "back" of lip.   . Gadolinium Derivatives Hives    Patient had MRI scan at [place]. Patient called one hour after he left imaging facility to report two "blisters" that came up on "back" of lip.   Marland Kitchen Doxycycline Other (See Comments)    "heart arrythmia" and "dyspepsia"  . Guaifenesin & Derivatives Other (See Comments)    Reaction unknown    Social History   Social History  . Marital status: Married    Spouse name: N/A  . Number of children: N/A  . Years of education: N/A    Occupational History  . Not on file.   Social History Main Topics  . Smoking status: Never Smoker  . Smokeless tobacco: Never Used  . Alcohol use No  . Drug use: No  . Sexual activity: Not on file   Other Topics Concern  . Not on file   Social History Narrative  . No narrative on file    Family History  Problem Relation Age of Onset  . Diabetes Mother   . Heart disease Mother   . Heart failure Mother   . Heart attack Mother   . Hyperlipidemia Mother   . Hypertension Mother   . Hyperlipidemia Father   . Allergic rhinitis Father   . Rashes / Skin problems Father   . Sudden death Neg Hx     BP (!) 141/84   Pulse (!) 105   Ht '5\' 10"'$  (1.778 m)   Review of Systems: See HPI above.     Objective:  Physical Exam:  Gen: NAD, comfortable in exam room  Left foot/ankle: Transverse arch collapse.  No gross deformity, swelling, ecchymoses FROM ankle without pain.  Some pain on flexion/extension of digits. TTP 3rd-5th metatarsal heads dorsally and plantar. Negative ant drawer and talar tilt.   Negative syndesmotic compression. Mild pain metatarsal squeeze. Thompsons test negative. NV intact distally.   Assessment & Plan:  1. Left foot pain - initial  injury suspected to be intermetatarsal ligament sprain but has continued to have pain 3 months later.  Exam and MSK u/s today were reassuring without evidence occult fracture or large neuroma.  Start with cam walker with sports insoles/scaphoid pads.  Transition to comfortable shoes with inserts when tolerated.  Consider custom orthotics, MRI if not improving as expected.  Tylenol if needed.

## 2016-02-06 NOTE — Assessment & Plan Note (Signed)
initial injury suspected to be intermetatarsal ligament sprain but has continued to have pain 3 months later.  Exam and MSK u/s today were reassuring without evidence occult fracture or large neuroma.  Start with cam walker with sports insoles/scaphoid pads.  Transition to comfortable shoes with inserts when tolerated.  Consider custom orthotics, MRI if not improving as expected.  Tylenol if needed.

## 2016-04-05 ENCOUNTER — Telehealth: Payer: 59 | Admitting: Family

## 2016-04-05 DIAGNOSIS — R059 Cough, unspecified: Secondary | ICD-10-CM

## 2016-04-05 DIAGNOSIS — J069 Acute upper respiratory infection, unspecified: Secondary | ICD-10-CM | POA: Diagnosis not present

## 2016-04-05 DIAGNOSIS — R05 Cough: Secondary | ICD-10-CM

## 2016-04-05 MED ORDER — AZITHROMYCIN 250 MG PO TABS: ORAL_TABLET | ORAL | 0 refills | Status: DC

## 2016-04-05 MED ORDER — BENZONATATE 100 MG PO CAPS: 100.0000 mg | ORAL_CAPSULE | Freq: Three times a day (TID) | ORAL | 0 refills | Status: DC | PRN

## 2016-04-05 NOTE — Progress Notes (Signed)
We are sorry that you are not feeling well.  Here is how we plan to help!  Based on what you have shared with me it looks like you have upper respiratory tract inflammation that has resulted in a significant cough.  Inflammation and infection in the upper respiratory tract is commonly called bronchitis and has four common causes:  Allergies, Viral Infections, Acid Reflux and Bacterial Infections.  Allergies, viruses and acid reflux are treated by controlling symptoms or eliminating the cause. An example might be a cough caused by taking certain blood pressure medications. You stop the cough by changing the medication. Another example might be a cough caused by acid reflux. Controlling the reflux helps control the cough.  Based on your presentation I believe you most likely have A cough due to bacteria.  When patients have a fever and a productive cough with a change in color or increased sputum production, we are concerned about bacterial bronchitis.  If left untreated it can progress to pneumonia.  If your symptoms do not improve with your treatment plan it is important that you contact your provider.   I have prescribed Azithromyin 250 mg: two tables now and then one tablet daily for 4 additonal days    In addition you may use A non-prescription cough medication called Robitussin DAC. Take 2 teaspoons every 8 hours or Delsym: take 2 teaspoons every 12 hours., A non-prescription cough medication called Mucinex DM: take 2 tablets every 12 hours. and A prescription cough medication called Tessalon Perles 100mg. You may take 1-2 capsules every 8 hours as needed for your cough.    USE OF BRONCHODILATOR ("RESCUE") INHALERS: There is a risk from using your bronchodilator too frequently.  The risk is that over-reliance on a medication which only relaxes the muscles surrounding the breathing tubes can reduce the effectiveness of medications prescribed to reduce swelling and congestion of the tubes themselves.   Although you feel brief relief from the bronchodilator inhaler, your asthma may actually be worsening with the tubes becoming more swollen and filled with mucus.  This can delay other crucial treatments, such as oral steroid medications. If you need to use a bronchodilator inhaler daily, several times per day, you should discuss this with your provider.  There are probably better treatments that could be used to keep your asthma under control.     HOME CARE . Only take medications as instructed by your medical team. . Complete the entire course of an antibiotic. . Drink plenty of fluids and get plenty of rest. . Avoid close contacts especially the very young and the elderly . Cover your mouth if you cough or cough into your sleeve. . Always remember to wash your hands . A steam or ultrasonic humidifier can help congestion.   GET HELP RIGHT AWAY IF: . You develop worsening fever. . You become short of breath . You cough up blood. . Your symptoms persist after you have completed your treatment plan MAKE SURE YOU   Understand these instructions.  Will watch your condition.  Will get help right away if you are not doing well or get worse.  Your e-visit answers were reviewed by a board certified advanced clinical practitioner to complete your personal care plan.  Depending on the condition, your plan could have included both over the counter or prescription medications. If there is a problem please reply  once you have received a response from your provider. Your safety is important to us.  If you have   drug allergies check your prescription carefully.    You can use MyChart to ask questions about today's visit, request a non-urgent call back, or ask for a work or school excuse for 24 hours related to this e-Visit. If it has been greater than 24 hours you will need to follow up with your provider, or enter a new e-Visit to address those concerns. You will get an e-mail in the next two days  asking about your experience.  I hope that your e-visit has been valuable and will speed your recovery. Thank you for using e-visits.   

## 2016-06-29 ENCOUNTER — Ambulatory Visit (INDEPENDENT_AMBULATORY_CARE_PROVIDER_SITE_OTHER): Payer: 59 | Admitting: Family Medicine

## 2016-06-29 ENCOUNTER — Encounter: Payer: Self-pay | Admitting: Family Medicine

## 2016-06-29 DIAGNOSIS — M25562 Pain in left knee: Secondary | ICD-10-CM | POA: Diagnosis not present

## 2016-06-29 DIAGNOSIS — M25561 Pain in right knee: Secondary | ICD-10-CM

## 2016-06-29 MED ORDER — NAPROXEN 500 MG PO TABS: 500.0000 mg | ORAL_TABLET | Freq: Two times a day (BID) | ORAL | 1 refills | Status: DC | PRN

## 2016-06-29 NOTE — Patient Instructions (Signed)
You sprained your MPFL (the ligament to the inside of the kneecap). Take naproxen '500mg'$  twice a day with food for pain and inflammation. Ice the knee 15 minutes at a time 3-4 times a day. Knee brace when up and walking around for support. Do home exercises (straight leg raises, knee extensions, straight leg raises with foot turned outwards) 3 sets of 10 once a day - would do exercises for both legs though. Consider arch supports as well. Take elevator as much as possible now. Follow up with me in 1 month but call me sooner if you're struggling. Physical therapy is a consideration but typically you'll do fine with the home stuff.

## 2016-07-01 ENCOUNTER — Ambulatory Visit: Payer: Self-pay | Admitting: Family Medicine

## 2016-07-01 DIAGNOSIS — M25562 Pain in left knee: Secondary | ICD-10-CM

## 2016-07-01 DIAGNOSIS — M25561 Pain in right knee: Secondary | ICD-10-CM | POA: Insufficient documentation

## 2016-07-01 NOTE — Assessment & Plan Note (Signed)
consistent with patellar subluxation, sprain of MPFL.  Start with naproxen, icing, shown home exercises to do daily.  Avoid stairs and inclines, deep squats, lunges.  Consider arch support, physical therapy.  F/u in 1 month.

## 2016-07-01 NOTE — Progress Notes (Signed)
PCP: Sharyne Richters, MD  Subjective:   HPI: Patient is a 41 y.o. male here for bilateral knee pain.  Patient denies acute injury. He states about a week ago he felt like he 'popped a tendon' anterior right knee. Associated with burning sensation laterally, felt like a match on fire here. Similar kind of feeling left knee but not as bad. Some mild swelling. Has been resting but not doing anything else for this. Pain level 3/10 with sitting, up to 10/10 and sharp at worse with ambulation. No skin changes, numbness.  Past Medical History:  Diagnosis Date  . Kidney stone   . Rickettsia infection     Current Outpatient Prescriptions on File Prior to Visit  Medication Sig Dispense Refill  . EPINEPHrine (EPIPEN 2-PAK) 0.3 mg/0.3 mL IJ SOAJ injection USE AS DIRECTED FOR LIFE THREATENING ALLERGIC REACTIONS 4 Device 3  . mometasone (NASONEX) 50 MCG/ACT nasal spray USE 1-2 SPRAYS IN EACH NOSTRIL ONCE DAILY 17 g 5  . montelukast (SINGULAIR) 10 MG tablet TAKE ONE TABLET ONCE DAILY 30 tablet 5  . testosterone enanthate (DELATESTRYL) 200 MG/ML injection Inject 200 mg into the muscle once a week. For IM use only     No current facility-administered medications on file prior to visit.     Past Surgical History:  Procedure Laterality Date  . CHOLECYSTECTOMY      Allergies  Allergen Reactions  . Ibuprofen Itching  . Pantoprazole Itching  . Tramadol Hives and Itching  . Gadobutrol Hives    Patient had MRI scan at [place]. Patient called one hour after he left imaging facility to report two "blisters" that came up on "back" of lip.   . Gadolinium Derivatives Hives    Patient had MRI scan at [place]. Patient called one hour after he left imaging facility to report two "blisters" that came up on "back" of lip.   Marland Kitchen Doxycycline Other (See Comments)    "heart arrythmia" and "dyspepsia"  . Guaifenesin & Derivatives Other (See Comments)    Reaction unknown    Social History    Social History  . Marital status: Married    Spouse name: N/A  . Number of children: N/A  . Years of education: N/A   Occupational History  . Not on file.   Social History Main Topics  . Smoking status: Never Smoker  . Smokeless tobacco: Never Used  . Alcohol use No  . Drug use: No  . Sexual activity: Not on file   Other Topics Concern  . Not on file   Social History Narrative  . No narrative on file    Family History  Problem Relation Age of Onset  . Diabetes Mother   . Heart disease Mother   . Heart failure Mother   . Heart attack Mother   . Hyperlipidemia Mother   . Hypertension Mother   . Hyperlipidemia Father   . Allergic rhinitis Father   . Rashes / Skin problems Father   . Sudden death Neg Hx     BP 123/81   Pulse 97   Ht '5\' 10"'$  (1.778 m)   Review of Systems: See HPI above.     Objective:  Physical Exam:  Gen: NAD, comfortable in exam room  Left knee: No gross deformity, ecchymoses, effusion. TTP medial and lateral patellar facets minimally. FROM. Negative ant/post drawers. Negative valgus/varus testing. Negative lachmanns. Negative mcmurrays, apleys, patellar apprehension. NV intact distally.  Right knee: No gross deformity, ecchymoses, swelling. TTP medial  and lateral patellar facets, worse over MPFL. FROM. Negative ant/post drawers. Negative valgus/varus testing. Negative lachmanns. Negative mcmurrays, apleys.  Pain with patellar apprehension. NV intact distally.   Assessment & Plan:  1. Bilateral knee pain - consistent with patellar subluxation, sprain of MPFL.  Start with naproxen, icing, shown home exercises to do daily.  Avoid stairs and inclines, deep squats, lunges.  Consider arch support, physical therapy.  F/u in 1 month.

## 2016-08-06 DIAGNOSIS — J309 Allergic rhinitis, unspecified: Secondary | ICD-10-CM | POA: Diagnosis not present

## 2016-08-06 DIAGNOSIS — R1012 Left upper quadrant pain: Secondary | ICD-10-CM | POA: Diagnosis not present

## 2016-08-06 DIAGNOSIS — K5900 Constipation, unspecified: Secondary | ICD-10-CM | POA: Diagnosis not present

## 2016-08-06 DIAGNOSIS — Z0000 Encounter for general adult medical examination without abnormal findings: Secondary | ICD-10-CM | POA: Diagnosis not present

## 2016-08-06 DIAGNOSIS — R202 Paresthesia of skin: Secondary | ICD-10-CM | POA: Diagnosis not present

## 2016-08-06 DIAGNOSIS — R200 Anesthesia of skin: Secondary | ICD-10-CM | POA: Diagnosis not present

## 2016-08-06 DIAGNOSIS — M791 Myalgia: Secondary | ICD-10-CM | POA: Diagnosis not present

## 2016-11-10 DIAGNOSIS — H9319 Tinnitus, unspecified ear: Secondary | ICD-10-CM | POA: Diagnosis not present

## 2016-11-10 DIAGNOSIS — S39012A Strain of muscle, fascia and tendon of lower back, initial encounter: Secondary | ICD-10-CM | POA: Diagnosis not present

## 2016-11-10 DIAGNOSIS — M47896 Other spondylosis, lumbar region: Secondary | ICD-10-CM | POA: Diagnosis not present

## 2016-11-10 DIAGNOSIS — M549 Dorsalgia, unspecified: Secondary | ICD-10-CM | POA: Diagnosis not present

## 2016-11-15 ENCOUNTER — Encounter: Payer: Self-pay | Admitting: Emergency Medicine

## 2016-11-15 ENCOUNTER — Emergency Department (INDEPENDENT_AMBULATORY_CARE_PROVIDER_SITE_OTHER)
Admission: EM | Admit: 2016-11-15 | Discharge: 2016-11-15 | Disposition: A | Discharge status: Home / Self Care | Payer: 59 | Source: Home / Self Care | Attending: Emergency Medicine | Admitting: Emergency Medicine

## 2016-11-15 DIAGNOSIS — J209 Acute bronchitis, unspecified: Secondary | ICD-10-CM

## 2016-11-15 DIAGNOSIS — J028 Acute pharyngitis due to other specified organisms: Secondary | ICD-10-CM

## 2016-11-15 MED ORDER — AZITHROMYCIN 250 MG PO TABS: ORAL_TABLET | ORAL | 1 refills | Status: DC

## 2016-11-15 MED ORDER — PREDNISONE 10 MG (21) PO TBPK
ORAL_TABLET | ORAL | 0 refills | Status: DC
Start: 2016-11-15 — End: 2017-05-25

## 2016-11-15 NOTE — ED Triage Notes (Signed)
Patient reports sore throat and oral mucosa/gingiva. Wonders if this is reaction to new rx of Voltaren about one week ago; he stopped taking 2 days later.

## 2016-11-15 NOTE — ED Provider Notes (Signed)
[name]    CSN: [id number] Arrival date & time: 11/15/16  1524     History   Chief Complaint Chief Complaint  Patient presents with  . Sore Throat    sore gums  Cough and congestion  HPI Brian [name] is a 41 y.o. male.   HPI 7 days of URI symptoms. Sinus congestion, sore throat, with irritation of gums, and the symptoms progressed to cough productive of yellow sputum. Has not tried any OTC treatments for this.  He had been prescribed Voltaren by his PCP a week ago for back pain, and he thought that the Voltaren could've caused these symptoms, but he since [name] the Voltaren 2 days ago and symptoms persisted.  He's noted some wheezing, and "easily winded", and he states it feels like similar cases of bronchitis with mild wheezing that he's had in the past. Had low-grade fever.. No chills. No nausea or vomiting or abdominal pain. No chest pain. No rash Past Medical History:  Diagnosis Date  . Kidney stone   . Rickettsia infection     Patient Active Problem List   Diagnosis Date Noted  . Bilateral knee pain 07/01/2016  . Left foot pain 02/06/2016  . Gastroesophageal reflux disease without esophagitis 05/08/2014  . Thrombocytopenia, unspecified ([place]) 06/13/2013  . 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 Low back pain 05/12/2012  . Allergic rhinitis 06/03/2009    Past Surgical History:  Procedure Laterality Date  . CHOLECYSTECTOMY         Home Medications    Prior to Admission medications   Medication Sig Start Date End Date Taking? Authorizing Provider  cyclobenzaprine (FLEXERIL) 10 MG tablet Take 10 mg by mouth at bedtime.   Yes [provider]  methocarbamol (ROBAXIN) 500 MG tablet Take 500 mg by mouth every morning.   Yes [provider]  azithromycin (ZITHROMAX Z-PAK) 250 MG tablet Take 2 tablets on day one, then 1 tablet daily on days 2 through 5 11/15/16   Jacqulyn Cane, MD  EPINEPHrine (EPIPEN 2-PAK) 0.3 mg/0.3 mL IJ SOAJ  injection USE AS DIRECTED FOR LIFE THREATENING ALLERGIC REACTIONS 04/30/15   Kozlow, Donnamarie Poag, MD  mometasone (NASONEX) 50 MCG/ACT nasal spray USE 1-2 SPRAYS IN EACH NOSTRIL ONCE DAILY 04/30/15   Kozlow, Donnamarie Poag, MD  montelukast (SINGULAIR) 10 MG tablet TAKE ONE TABLET ONCE DAILY 04/30/15   Kozlow, Donnamarie Poag, MD  predniSONE (STERAPRED UNI-PAK 21 TAB) 10 MG (21) TBPK tablet Take as directed for 6 days.--Take 6 on day 1, 5 on day 2, 4 on day 3, then 3 tablets on day 4, then 2 tablets on day 5, then 1 on day 6. 11/15/16   Jacqulyn Cane, MD  testosterone enanthate (DELATESTRYL) 200 MG/ML injection Inject 200 mg into the muscle once a week. For IM use only    [provider]    Family History Family History  Problem Relation Age of Onset  . Diabetes Mother   . Heart disease Mother   . Heart failure Mother   . Heart attack Mother   . Hyperlipidemia Mother   . Hypertension Mother   . Hyperlipidemia Father   . Allergic rhinitis Father   . Rashes / Skin problems Father   . Sudden death Neg Hx     Social History Social History  Substance Use Topics  . Smoking status: Never Smoker  . Smokeless tobacco: Never Used  . Alcohol use No     Allergies   Ibuprofen; Pantoprazole; Tramadol; Gadobutrol;  Gadolinium derivatives; Doxycycline; and Guaifenesin & derivatives   Review of Systems Review of Systems  All other systems reviewed and are negative.    Physical Exam Triage Vital Signs ED Triage Vitals [11/15/16 1543]  Enc Vitals Group     BP 125/86     Pulse Rate 96     Resp 16     Temp 98.4 F (36.9 C)     Temp Source Oral     SpO2 97 %     Weight 280 lb (127 kg)     Height 5\' 10"  (1.778 m)     Head Circumference      Peak Flow      Pain Score      Pain Loc      Pain Edu?      Excl. in [place]?    No data found.   Updated Vital Signs BP 125/86 (BP Location: Left Arm)   Pulse 96   Temp 98.4 F (36.9 C) (Oral)   Resp 16   Ht 5\' 10"  (1.778 m)   Wt 280 lb (127 kg)   SpO2  97%   BMI 40.18 kg/m   Visual Acuity Right Eye Distance:   Left Eye Distance:   Bilateral Distance:    Right Eye Near:   Left Eye Near:    Bilateral Near:     Physical Exam  Constitutional: He is oriented to person, place, and time. He appears well-developed and well-nourished. No distress.  HENT:  Head: Normocephalic and atraumatic.  Right Ear: Tympanic membrane, external ear and ear canal normal.  Left Ear: Tympanic membrane, external ear and ear canal normal.  Nose: Mucosal edema and rhinorrhea present. Right sinus exhibits maxillary sinus tenderness. Left sinus exhibits maxillary sinus tenderness.  Mouth/Throat: No oral lesions. No oropharyngeal exudate.  Posterior pharynx moderately red with slightly inflamed tonsils bilaterally, but no exudate. There is lymphoid hyperplasia posterior pharynx.  Eyes: Right eye exhibits no discharge. Left eye exhibits no discharge. No scleral icterus.  Neck: Neck supple.  Cardiovascular: Normal rate, regular rhythm and normal heart sounds.   Pulmonary/Chest: Effort normal. He has wheezes (Mild late expiratory). He has rhonchi. He has no rales.  Lymphadenopathy:    He has no cervical adenopathy.  Neurological: He is alert and oriented to person, place, and time.  Skin: Skin is warm and dry. No rash noted.  Nursing note and vitals reviewed.    UC Treatments / Results  Labs (all labs ordered are listed, but only abnormal results are displayed) Labs Reviewed - No data to display  EKG  EKG Interpretation None       Radiology No results found.  Procedures Procedures (including critical care time)  Medications Ordered in UC Medications - No data to display   Initial Impression / Assessment and Plan / UC Course  I have reviewed the triage vital signs and the nursing notes.  Pertinent labs & imaging results that were available during my care of the patient were reviewed by me and considered in my medical decision making (see  chart for details).      Final Clinical Impressions(s) / UC Diagnoses   Final diagnoses:  Acute bronchitis, unspecified organism  Pharyngitis due to other organism   Treatment options discussed, as well as risks, benefits, alternatives. He declined amoxicillin or Augmentin for antibiotic. Patient voiced understanding and agreement with the following plans:  New Prescriptions Discharge Medication List as of 11/15/2016  4:10 PM    START taking these  medications   Details  azithromycin (ZITHROMAX Z-PAK) 250 MG tablet Take 2 tablets on day one, then 1 tablet daily on days 2 through 5, Print    predniSONE (STERAPRED UNI-PAK 21 TAB) 10 MG (21) TBPK tablet Take as directed for 6 days.--Take 6 on day 1, 5 on day 2, 4 on day 3, then 3 tablets on day 4, then 2 tablets on day 5, then 1 on day 6., Print        Other symptomatic care discussed. Follow-up with your primary care doctor in 5-7 days if not improving, or sooner if symptoms become worse. Precautions discussed. Red flags discussed. Questions invited and answered. Patient voiced understanding and agreement.    Jacqulyn Cane, MD 11/15/16 [phone]

## 2016-11-20 DIAGNOSIS — Z77122 Contact with and (suspected) exposure to noise: Secondary | ICD-10-CM | POA: Diagnosis not present

## 2016-11-20 DIAGNOSIS — H9311 Tinnitus, right ear: Secondary | ICD-10-CM | POA: Diagnosis not present

## 2016-11-20 DIAGNOSIS — H905 Unspecified sensorineural hearing loss: Secondary | ICD-10-CM | POA: Diagnosis not present

## 2016-11-20 DIAGNOSIS — H903 Sensorineural hearing loss, bilateral: Secondary | ICD-10-CM | POA: Diagnosis not present

## 2017-01-15 DIAGNOSIS — Z91018 Allergy to other foods: Secondary | ICD-10-CM | POA: Diagnosis not present

## 2017-03-26 DIAGNOSIS — H5712 Ocular pain, left eye: Secondary | ICD-10-CM | POA: Diagnosis not present

## 2017-03-26 DIAGNOSIS — S0502XA Injury of conjunctiva and corneal abrasion without foreign body, left eye, initial encounter: Secondary | ICD-10-CM | POA: Diagnosis not present

## 2017-03-29 DIAGNOSIS — R221 Localized swelling, mass and lump, neck: Secondary | ICD-10-CM | POA: Diagnosis not present

## 2017-03-29 DIAGNOSIS — S0502XD Injury of conjunctiva and corneal abrasion without foreign body, left eye, subsequent encounter: Secondary | ICD-10-CM | POA: Diagnosis not present

## 2017-03-30 DIAGNOSIS — R220 Localized swelling, mass and lump, head: Secondary | ICD-10-CM | POA: Diagnosis not present

## 2017-03-30 DIAGNOSIS — R221 Localized swelling, mass and lump, neck: Secondary | ICD-10-CM | POA: Diagnosis not present

## 2017-04-09 ENCOUNTER — Other Ambulatory Visit (HOSPITAL_COMMUNITY)
Admission: RE | Admit: 2017-04-09 | Discharge: 2017-04-09 | Disposition: A | Discharge status: Home / Self Care | Payer: 59 | Source: Ambulatory Visit | Attending: Endocrinology | Admitting: Endocrinology

## 2017-04-09 ENCOUNTER — Encounter: Payer: Self-pay | Admitting: Endocrinology

## 2017-04-09 ENCOUNTER — Ambulatory Visit (INDEPENDENT_AMBULATORY_CARE_PROVIDER_SITE_OTHER): Payer: 59 | Admitting: Endocrinology

## 2017-04-09 DIAGNOSIS — E041 Nontoxic single thyroid nodule: Secondary | ICD-10-CM | POA: Insufficient documentation

## 2017-04-09 NOTE — Patient Instructions (Signed)
We'll let you know about the biopsy results. If as expected, no cancer is found, Please come back for a follow-up appointment in 6-12 months.

## 2017-04-09 NOTE — Progress Notes (Signed)
Subjective:    Patient ID: Brian [name], male    DOB: 1976/01/18, 42 y.o.   MRN: [id number]  HPI Pt is referred by Dr Prudy Feeler, for nodular thyroid.  In late 2018, pt noted slight swelling at the ant neck, but no assoc pain.  He is unaware of ever having had thyroid problems in the past  He has no h/o XRT or surgery to the neck.   Past Medical History:  Diagnosis Date  . Kidney stone   . Rickettsia infection     Past Surgical History:  Procedure Laterality Date  . CHOLECYSTECTOMY      Social History   Socioeconomic History  . Marital status: Married    Spouse name: Not on file  . Number of children: Not on file  . Years of education: Not on file  . Highest education level: Not on file  Social Needs  . Financial resource strain: Not on file  . Food insecurity - worry: Not on file  . Food insecurity - inability: Not on file  . Transportation needs - medical: Not on file  . Transportation needs - non-medical: Not on file  Occupational History  . Not on file  Tobacco Use  . Smoking status: Never Smoker  . Smokeless tobacco: Never Used  Substance and Sexual Activity  . Alcohol use: No  . Drug use: No  . Sexual activity: Not on file  Other Topics Concern  . Not on file  Social History Narrative  . Not on file    Current Outpatient Medications on File Prior to Visit  Medication Sig Dispense Refill  . cyclobenzaprine (FLEXERIL) 10 MG tablet Take 10 mg by mouth at bedtime.    Marland Kitchen EPINEPHrine (EPIPEN 2-PAK) 0.3 mg/0.3 mL IJ SOAJ injection USE AS DIRECTED FOR LIFE THREATENING ALLERGIC REACTIONS 4 Device 3  . mometasone (NASONEX) 50 MCG/ACT nasal spray USE 1-2 SPRAYS IN EACH NOSTRIL ONCE DAILY 17 g 5  . montelukast (SINGULAIR) 10 MG tablet TAKE ONE TABLET ONCE DAILY 30 tablet 5  . predniSONE (STERAPRED UNI-PAK 21 TAB) 10 MG (21) TBPK tablet Take as directed for 6 days.--Take 6 on day 1, 5 on day 2, 4 on day 3, then 3 tablets on day 4, then 2 tablets on day 5, then 1 on day 6. 21  tablet 0  . azithromycin (ZITHROMAX Z-PAK) 250 MG tablet Take 2 tablets on day one, then 1 tablet daily on days 2 through 5 (Patient not taking: Reported on 04/09/2017) 1 each 1  . methocarbamol (ROBAXIN) 500 MG tablet Take 500 mg by mouth every morning.    . testosterone enanthate (DELATESTRYL) 200 MG/ML injection Inject 200 mg into the muscle once a week. For IM use only     No current facility-administered medications on file prior to visit.     Allergies  Allergen Reactions  . Ibuprofen Itching  . Pantoprazole Itching  . Tramadol Hives and Itching  . Gadobutrol Hives    Patient had MRI scan at [place]. Patient called one hour after he left imaging facility to report two "blisters" that came up on "back" of lip.   . Gadolinium Derivatives Hives    Patient had MRI scan at [place]. Patient called one hour after he left imaging facility to report two "blisters" that came up on "back" of lip.   Marland Kitchen Doxycycline Other (See Comments)    "heart arrythmia" and "dyspepsia"  . Guaifenesin & Derivatives Other (See Comments)  Reaction unknown    Family History  Problem Relation Age of Onset  . Diabetes Mother   . Heart disease Mother   . Heart failure Mother   . Heart attack Mother   . Hyperlipidemia Mother   . Hypertension Mother   . Hyperlipidemia Father   . Allergic rhinitis Father   . Rashes / Skin problems Father   . Sudden death Neg Hx   . Thyroid disease Neg Hx     BP 140/90 (BP Location: Right Arm, Patient Position: Sitting, Cuff Size: Normal)   Pulse 82   Wt 298 lb 12.8 oz (135.5 kg)   SpO2 96%   BMI 42.87 kg/m   Review of Systems Denies hoarseness, visual loss, chest pain, sob, cough, dysphagia, diarrhea, itching, flushing, easy bruising, depression, cold intolerance, numbness, and rhinorrhea.  He has fatigue, headache, and weight gain.      Objective:   Physical Exam VS: see vs page GEN: no distress HEAD: head: no deformity eyes: no  periorbital swelling, no proptosis external nose and ears are normal mouth: no lesion seen NECK: I can easily feel the right thyroid nodule.   CHEST WALL: no deformity LUNGS: clear to auscultation CV: reg rate and rhythm, no murmur ABD: abdomen is soft, nontender.  no hepatosplenomegaly.  not distended.  no hernia MUSCULOSKELETAL: muscle bulk and strength are grossly normal.  no obvious joint swelling.  gait is normal and steady EXTEMITIES: no deformity.  no ulcer on the feet.  feet are of normal color and temp.  no edema PULSES: dorsalis pedis intact bilat.  no carotid bruit NEURO:  cn 2-12 grossly intact.   readily moves all 4's.  sensation is intact to touch on the feet SKIN:  Normal texture and temperature.  No rash or suspicious lesion is visible.   NODES:  None palpable at the neck PSYCH: alert, well-oriented.  Does not appear anxious nor depressed.   [place]: Mixed echogenicity solid mass with punctate calcifications and internal vascularity at the mid-lower pole of the right lobe measuring 2.9 cm.  [place] not see in [address] CT.   outside test results are reviewed: TSH=2.3  I have reviewed outside records, and summarized: Pt was noted to have thyroid nodule, and referred here.  He self-reported sxs of neck swelling.  thyroid needle bx: consent obtained, signed form on chart The area is first sprayed with cooling agent local: xylocaine 2%, with epinephrine prep: alcohol pad 4 bxs are done with 25 and 03E needles no complications     Assessment & Plan:  Thyroid nodule, new, uncertain etiology   Patient Instructions  We'll let you know about the biopsy results. If as expected, no cancer is found, Please come back for a follow-up appointment in 6-12 months.

## 2017-04-13 ENCOUNTER — Telehealth: Payer: Self-pay | Admitting: Endocrinology

## 2017-04-13 NOTE — Telephone Encounter (Signed)
Results are not back yet, waiting on MD to review.

## 2017-04-13 NOTE — Telephone Encounter (Signed)
Dr. Loanne Drilling, please review recent result.

## 2017-04-13 NOTE — Telephone Encounter (Signed)
Patient is calling for lab results.

## 2017-04-30 ENCOUNTER — Ambulatory Visit: Payer: 59 | Admitting: Endocrinology

## 2017-05-14 DIAGNOSIS — J0181 Other acute recurrent sinusitis: Secondary | ICD-10-CM | POA: Diagnosis not present

## 2017-05-16 ENCOUNTER — Emergency Department (HOSPITAL_BASED_OUTPATIENT_CLINIC_OR_DEPARTMENT_OTHER)
Admission: EM | Admit: 2017-05-16 | Discharge: 2017-05-16 | Disposition: A | Discharge status: Home / Self Care | Payer: 59 | Attending: Emergency Medicine | Admitting: Emergency Medicine

## 2017-05-16 ENCOUNTER — Other Ambulatory Visit: Payer: Self-pay

## 2017-05-16 ENCOUNTER — Emergency Department (HOSPITAL_BASED_OUTPATIENT_CLINIC_OR_DEPARTMENT_OTHER): Payer: 59

## 2017-05-16 ENCOUNTER — Encounter (HOSPITAL_BASED_OUTPATIENT_CLINIC_OR_DEPARTMENT_OTHER): Payer: Self-pay | Admitting: Emergency Medicine

## 2017-05-16 DIAGNOSIS — R05 Cough: Secondary | ICD-10-CM | POA: Insufficient documentation

## 2017-05-16 DIAGNOSIS — T7840XA Allergy, unspecified, initial encounter: Secondary | ICD-10-CM | POA: Diagnosis not present

## 2017-05-16 DIAGNOSIS — Z79899 Other long term (current) drug therapy: Secondary | ICD-10-CM | POA: Diagnosis not present

## 2017-05-16 DIAGNOSIS — R0981 Nasal congestion: Secondary | ICD-10-CM | POA: Insufficient documentation

## 2017-05-16 DIAGNOSIS — R51 Headache: Secondary | ICD-10-CM | POA: Insufficient documentation

## 2017-05-16 DIAGNOSIS — J320 Chronic maxillary sinusitis: Secondary | ICD-10-CM | POA: Diagnosis not present

## 2017-05-16 DIAGNOSIS — R0602 Shortness of breath: Secondary | ICD-10-CM | POA: Diagnosis not present

## 2017-05-16 DIAGNOSIS — R0789 Other chest pain: Secondary | ICD-10-CM | POA: Insufficient documentation

## 2017-05-16 DIAGNOSIS — L299 Pruritus, unspecified: Secondary | ICD-10-CM | POA: Diagnosis present

## 2017-05-16 DIAGNOSIS — R079 Chest pain, unspecified: Secondary | ICD-10-CM | POA: Diagnosis not present

## 2017-05-16 LAB — CBC WITH DIFFERENTIAL/PLATELET
BASOS PCT: 1 %
Basophils Absolute: 0 10*3/uL (ref 0.0–0.1)
EOS ABS: 0 10*3/uL (ref 0.0–0.7)
EOS PCT: 1 %
HCT: 41.2 % (ref 39.0–52.0)
HEMOGLOBIN: 14.1 g/dL (ref 13.0–17.0)
Lymphocytes Relative: 31 %
Lymphs Abs: 1.9 10*3/uL (ref 0.7–4.0)
MCH: 29.1 pg (ref 26.0–34.0)
MCHC: 34.2 g/dL (ref 30.0–36.0)
MCV: 85.1 fL (ref 78.0–100.0)
MONO ABS: 0.4 10*3/uL (ref 0.1–1.0)
MONOS PCT: 7 %
NEUTROS PCT: 60 %
Neutro Abs: 3.6 10*3/uL (ref 1.7–7.7)
PLATELETS: 196 10*3/uL (ref 150–400)
RBC: 4.84 MIL/uL (ref 4.22–5.81)
RDW: 13.8 % (ref 11.5–15.5)
WBC: 6 10*3/uL (ref 4.0–10.5)

## 2017-05-16 LAB — BASIC METABOLIC PANEL
Anion gap: 9 (ref 5–15)
BUN: 14 mg/dL (ref 6–20)
CALCIUM: 8.8 mg/dL — AB (ref 8.9–10.3)
CO2: 23 mmol/L (ref 22–32)
CREATININE: 0.89 mg/dL (ref 0.61–1.24)
Chloride: 107 mmol/L (ref 101–111)
GFR calc non Af Amer: >60 mL/min (ref >60)
Glucose, Bld: 163 mg/dL — ABNORMAL HIGH (ref 65–99)
Potassium: 3.3 mmol/L — ABNORMAL LOW (ref 3.5–5.1)
SODIUM: 139 mmol/L (ref 135–145)

## 2017-05-16 LAB — BRAIN NATRIURETIC PEPTIDE: B Natriuretic Peptide: 33.7 pg/mL (ref 0.0–100.0)

## 2017-05-16 LAB — TROPONIN I

## 2017-05-16 MED ORDER — PREDNISONE 20 MG PO TABS
20.0000 mg | ORAL_TABLET | Freq: Once | ORAL | Status: AC
  Administered 2017-05-16: 20 mg via ORAL
  Filled 2017-05-16: qty 1

## 2017-05-16 MED ORDER — DIPHENHYDRAMINE HCL 50 MG/ML IJ SOLN
25.0000 mg | Freq: Once | INTRAMUSCULAR | Status: AC
  Administered 2017-05-16: 25 mg via INTRAVENOUS
  Filled 2017-05-16: qty 1

## 2017-05-16 MED ORDER — ALBUTEROL SULFATE HFA 108 (90 BASE) MCG/ACT IN AERS
2.0000 | INHALATION_SPRAY | Freq: Once | RESPIRATORY_TRACT | Status: AC
  Administered 2017-05-16: 2 via RESPIRATORY_TRACT
  Filled 2017-05-16: qty 6.7

## 2017-05-16 MED ORDER — FAMOTIDINE 20 MG PO TABS
40.0000 mg | ORAL_TABLET | Freq: Once | ORAL | Status: AC
  Administered 2017-05-16: 40 mg via ORAL
  Filled 2017-05-16: qty 2

## 2017-05-16 MED ORDER — PROMETHAZINE-DM 6.25-15 MG/5ML PO SYRP: 5.0000 mL | ORAL_SOLUTION | Freq: Four times a day (QID) | ORAL | 0 refills | Status: DC | PRN

## 2017-05-16 MED ORDER — ONDANSETRON 8 MG PO TBDP
8.0000 mg | ORAL_TABLET | Freq: Once | ORAL | Status: AC
  Administered 2017-05-16: 8 mg via ORAL
  Filled 2017-05-16: qty 1

## 2017-05-16 MED ORDER — AEROCHAMBER PLUS FLO-VU MEDIUM MISC
1.0000 | Freq: Once | Status: AC
  Administered 2017-05-16: 1
  Filled 2017-05-16: qty 1

## 2017-05-16 NOTE — ED Notes (Addendum)
Pt states he is allergic to guaifenesin and he thinks the Nyquail he took had guaifenesin in it last PM. After taking it pt had burning in his chest, rash, itchiness, and ShOB that was relieved with benadryl. Pt states the symptoms returned this AM.

## 2017-05-16 NOTE — ED Triage Notes (Signed)
patient had bad allergies last week. The patient reports that he is already on prednisone and antibiotic. The patient took nyquil last night and notes that he had a rash and his lungs are burning. The patient is talking without difficulty at this time but has increased SOB and WOB

## 2017-05-16 NOTE — ED Provider Notes (Signed)
[facility] EMERGENCY DEPARTMENT Provider Note   CSN: [id number] Arrival date & time: 05/16/17  1437     History   Chief Complaint Chief Complaint  Patient presents with  . Allergic Reaction    HPI [name] is a 42 y.o. male.  42 year old male with past medical history including GERD, thyroid nodule, kidney stones who presents with multiple complaints including chest burning, itching, and nasal congestion.  Almost 2 weeks ago, he developed nasal congestion, sinus pressure, headaches, and URI symptoms.  No fevers, chills, or sweats.  He is also had a cough and postnasal drip.  His symptoms worsened with worsening nasal drainage and sinus pressure so he was seen by his PCP 2 days ago who started him on prednisone and clarithromycin.  He has had 3 doses of these medications.  Last night, he took NyQuil and is concerned that it may have contained guaifenesin which he is allergic to.  He later began having a burning feeling on his face and neck associated with redness in these areas and itching.  He reports associated chest burning, shortness of breath, and his throat feeling thick.  He took 25 mg of Benadryl which relieved his symptoms.  This morning his symptoms seemed to return again and around 9:30 AM he took another 25 mg of Benadryl.  He felt like his symptoms started returning yet another time this afternoon.  He continues to have chest burning across his anterior chest, some shortness of breath with exertion.  He continues to have productive cough.  He has had minor swelling of ankles that he noticed yesterday after getting home from work.  He has gained weight over the past month.  He denies any orthopnea or paroxysmal nocturnal dyspnea.  No history of heart failure.  No recent travel, history of blood clots, or history of cancer.  The history is provided by the patient.  Allergic Reaction    Past Medical History:  Diagnosis Date  . Kidney stone   . Rickettsia infection      Patient Active Problem List   Diagnosis Date Noted  . Right thyroid nodule 04/09/2017  . Bilateral knee pain 07/01/2016  . Left foot pain 02/06/2016  . Gastroesophageal reflux disease without esophagitis 05/08/2014  . Thrombocytopenia, unspecified ([place]) 06/13/2013  . 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 Low back pain 05/12/2012  . Allergic rhinitis 06/03/2009    Past Surgical History:  Procedure Laterality Date  . CHOLECYSTECTOMY          Home Medications    Prior to Admission medications   Medication Sig Start Date End Date Taking? Authorizing Provider  azithromycin (ZITHROMAX Z-PAK) 250 MG tablet Take 2 tablets on day one, then 1 tablet daily on days 2 through 5 Patient not taking: Reported on 04/09/2017 11/15/16   Jacqulyn Cane, MD  cyclobenzaprine (FLEXERIL) 10 MG tablet Take 10 mg by mouth at bedtime.    [provider]  EPINEPHrine (EPIPEN 2-PAK) 0.3 mg/0.3 mL IJ SOAJ injection USE AS DIRECTED FOR LIFE THREATENING ALLERGIC REACTIONS 04/30/15   Kozlow, Donnamarie Poag, MD  methocarbamol (ROBAXIN) 500 MG tablet Take 500 mg by mouth every morning.    [provider]  mometasone (NASONEX) 50 MCG/ACT nasal spray USE 1-2 SPRAYS IN EACH NOSTRIL ONCE DAILY 04/30/15   Kozlow, Donnamarie Poag, MD  montelukast (SINGULAIR) 10 MG tablet TAKE ONE TABLET ONCE DAILY 04/30/15   Kozlow, Donnamarie Poag, MD  predniSONE (STERAPRED UNI-PAK 21 TAB) 10 MG (21) TBPK tablet Take  as directed for 6 days.--Take 6 on day 1, 5 on day 2, 4 on day 3, then 3 tablets on day 4, then 2 tablets on day 5, then 1 on day 6. 11/15/16   Jacqulyn Cane, MD  promethazine-dextromethorphan (PROMETHAZINE-DM) 6.25-15 MG/5ML syrup Take 5 mLs by mouth 4 (four) times daily as needed for cough. 05/16/17   Little, Wenda Overland, MD  testosterone enanthate (DELATESTRYL) 200 MG/ML injection Inject 200 mg into the muscle once a week. For IM use only    [provider]    Family History Family History  Problem Relation Age of  Onset  . Diabetes Mother   . Heart disease Mother   . Heart failure Mother   . Heart attack Mother   . Hyperlipidemia Mother   . Hypertension Mother   . Hyperlipidemia Father   . Allergic rhinitis Father   . Rashes / Skin problems Father   . Sudden death Neg Hx   . Thyroid disease Neg Hx     Social History Social History   Tobacco Use  . Smoking status: Never Smoker  . Smokeless tobacco: Never Used  Substance Use Topics  . Alcohol use: No  . Drug use: No     Allergies   Ibuprofen; Pantoprazole; Tramadol; Gadobutrol; Gadolinium derivatives; Doxycycline; and Guaifenesin & derivatives   Review of Systems Review of Systems All other systems reviewed and are negative except that which was mentioned in HPI   Physical Exam Updated Vital Signs BP 125/85   Pulse 90   Temp 98.1 F (36.7 C) (Oral)   Resp 20   Ht 5\' 10"  (1.778 m)   Wt 135.2 kg (298 lb)   SpO2 97%   BMI 42.76 kg/m   Physical Exam  Constitutional: He is oriented to person, place, and time. He appears well-developed and well-nourished. No distress.  HENT:  Head: Normocephalic and atraumatic.  Mouth/Throat: Oropharynx is clear and moist.  Moist mucous membranes  Eyes: Conjunctivae are normal.  Neck: Neck supple.  Cardiovascular: Normal rate, regular rhythm and normal heart sounds.  No murmur heard. Pulmonary/Chest: Effort normal and breath sounds normal. No stridor. He has no wheezes.  Abdominal: Soft. Bowel sounds are normal. He exhibits no distension. There is no tenderness.  Musculoskeletal: He exhibits no edema.  Neurological: He is alert and oriented to person, place, and time.  Fluent speech  Skin: Skin is warm and dry. No rash noted.  No urticaria  Psychiatric: He has a normal mood and affect. Judgment normal.  Nursing note and vitals reviewed.    ED Treatments / Results  Labs (all labs ordered are listed, but only abnormal results are displayed) Labs Reviewed  BASIC METABOLIC PANEL -  Abnormal; Notable for the following components:      Result Value   Potassium 3.3 (*)    Glucose, Bld 163 (*)    Calcium 8.8 (*)    All other components within normal limits  CBC WITH DIFFERENTIAL/PLATELET  BRAIN NATRIURETIC PEPTIDE  TROPONIN I    EKG EKG Interpretation  Date/Time:  Sunday May 16 2017 15:11:05 EDT Ventricular Rate:  92 PR Interval:    QRS Duration: 82 QT Interval:  359 QTC Calculation: 445 R Axis:   43 Text Interpretation:  Sinus rhythm No significant change since last tracing Confirmed by Theotis Burrow [id number]) on 05/16/2017 3:18:22 PM   Radiology Dg Chest 2 View  Result Date: 05/16/2017 CLINICAL DATA:  Shortness of breath. EXAM: CHEST - 2 VIEW COMPARISON:  Radiographs of April 19, 2014. FINDINGS: The heart size and mediastinal contours are within normal limits. Both lungs are clear. No pneumothorax or pleural effusion is noted. The visualized skeletal structures are unremarkable. IMPRESSION: No active cardiopulmonary disease. Electronically Signed   By: Marijo Conception, M.D.   On: 05/16/2017 15:30    Procedures Procedures (including critical care time)  Medications Ordered in ED Medications  diphenhydrAMINE (BENADRYL) injection 25 mg (25 mg Intravenous Given 05/16/17 1504)  predniSONE (DELTASONE) tablet 20 mg (20 mg Oral Given 05/16/17 1539)  famotidine (PEPCID) tablet 40 mg (40 mg Oral Given 05/16/17 1539)  albuterol (PROVENTIL HFA;VENTOLIN HFA) 108 (90 Base) MCG/ACT inhaler 2 puff (2 puffs Inhalation Given 05/16/17 1554)  AEROCHAMBER PLUS FLO-VU MEDIUM MISC 1 each (1 each Other Given 05/16/17 1554)  ondansetron (ZOFRAN-ODT) disintegrating tablet 8 mg (8 mg Oral Given 05/16/17 1634)     Initial Impression / Assessment and Plan / ED Course  I have reviewed the triage vital signs and the nursing notes.  Pertinent labs & imaging results that were available during my care of the patient were reviewed by me and considered in my medical decision making (see  chart for details).    PT well appearing on exam w/ reassuring VS. EKG without ischemia. Clear breath sounds. No signs/sx of anaphylaxis. It is unclear whether his current symptoms are due to a reaction to the medication he took last night for a persistence of his recent URI/sinus infection symptoms.  Chest x-ray is clear and he has no wheezing on exam. Labs show normal CBC, BMP and negative BNP and troponin. He was comfortable on reassessment without any allergic reaction symptoms. He is already on prednisone, instructed to continue course as well as abx for sinusitis. Discussed measures to treat possible allergic reaction with benadryl and pepcid. Instructed to f/u with PCP and extensively reviewed return precautions.  He voiced understanding and was discharged in satisfactory condition. Final Clinical Impressions(s) / ED Diagnoses   Final diagnoses:  Maxillary sinusitis, unspecified chronicity  Burning in the chest    ED Discharge Orders        Ordered    promethazine-dextromethorphan (PROMETHAZINE-DM) 6.25-15 MG/5ML syrup  4 times daily PRN     05/16/17 1813       Little, Wenda Overland, MD 05/16/17 [phone]

## 2017-05-25 ENCOUNTER — Ambulatory Visit (INDEPENDENT_AMBULATORY_CARE_PROVIDER_SITE_OTHER): Payer: 59 | Admitting: Endocrinology

## 2017-05-25 ENCOUNTER — Encounter: Payer: Self-pay | Admitting: Endocrinology

## 2017-05-25 VITALS — BP 130/90 | HR 84 | Temp 98.3°F | Wt 300.6 lb

## 2017-05-25 DIAGNOSIS — E041 Nontoxic single thyroid nodule: Secondary | ICD-10-CM | POA: Diagnosis not present

## 2017-05-25 LAB — TSH: TSH: 1.04 u[IU]/mL (ref 0.35–4.50)

## 2017-05-25 NOTE — Patient Instructions (Addendum)
blood tests are requested for you today.  We'll let you know about the results. Please see an ear-nose-throat specialist.  you will receive a phone call, about a day and time for an appointment.

## 2017-05-25 NOTE — Progress Notes (Signed)
Subjective:    Patient ID: Brian [name], male    DOB: Jul 31, 1975, 42 y.o.   MRN: [id number]  HPI Pt returns for f/u of nodular thyroid (dx'ed 2018; bx then showed BENIGN FOLLICULAR NODULE ([place]); [place] (2019) showed mixed echogenicity solid mass with punctate calcifications and internal vascularity at the mid-lower pole of the right lobe measuring 2.9 cm); 2015 CT did not show the nodules).  Pt reports ongoing slight swelling at the ant neck, and assoc pain.   Past Medical History:  Diagnosis Date  . Kidney stone   . Rickettsia infection     Past Surgical History:  Procedure Laterality Date  . CHOLECYSTECTOMY      Social History   Socioeconomic History  . Marital status: Married    Spouse name: Not on file  . Number of children: Not on file  . Years of education: Not on file  . Highest education level: Not on file  Occupational History  . Not on file  Social Needs  . Financial resource strain: Not on file  . Food insecurity:    Worry: Not on file    Inability: Not on file  . Transportation needs:    Medical: Not on file    Non-medical: Not on file  Tobacco Use  . Smoking status: Never Smoker  . Smokeless tobacco: Never Used  Substance and Sexual Activity  . Alcohol use: No  . Drug use: No  . Sexual activity: Not on file  Lifestyle  . Physical activity:    Days per week: Not on file    Minutes per session: Not on file  . Stress: Not on file  Relationships  . Social connections:    Talks on phone: Not on file    Gets together: Not on file    Attends religious service: Not on file    Active member of club or organization: Not on file    Attends meetings of clubs or organizations: Not on file    Relationship status: Not on file  . Intimate partner violence:    Fear of current or ex partner: Not on file    Emotionally abused: Not on file    Physically abused: Not on file    Forced sexual activity: Not on file  Other Topics Concern  . Not on file    Social History Narrative  . Not on file    Current Outpatient Medications on File Prior to Visit  Medication Sig Dispense Refill  . cyclobenzaprine (FLEXERIL) 10 MG tablet Take 10 mg by mouth at bedtime.    Marland Kitchen EPINEPHrine (EPIPEN 2-PAK) 0.3 mg/0.3 mL IJ SOAJ injection USE AS DIRECTED FOR LIFE THREATENING ALLERGIC REACTIONS 4 Device 3  . methocarbamol (ROBAXIN) 500 MG tablet Take 500 mg by mouth every morning.    . mometasone (NASONEX) 50 MCG/ACT nasal spray USE 1-2 SPRAYS IN EACH NOSTRIL ONCE DAILY 17 g 5  . montelukast (SINGULAIR) 10 MG tablet TAKE ONE TABLET ONCE DAILY 30 tablet 5  . testosterone enanthate (DELATESTRYL) 200 MG/ML injection Inject 200 mg into the muscle once a week. For IM use only     No current facility-administered medications on file prior to visit.     Allergies  Allergen Reactions  . Ibuprofen Itching  . Pantoprazole Itching  . Tramadol Hives and Itching  . Gadobutrol Hives    Patient had MRI scan at [place]. Patient called one hour after he left imaging facility to report two "blisters"  that came up on "back" of lip.   . Gadolinium Derivatives Hives    Patient had MRI scan at [place]. Patient called one hour after he left imaging facility to report two "blisters" that came up on "back" of lip.   Marland Kitchen Doxycycline Other (See Comments)    "heart arrythmia" and "dyspepsia"  . Guaifenesin & Derivatives Other (See Comments)    Reaction unknown    Family History  Problem Relation Age of Onset  . Diabetes Mother   . Heart disease Mother   . Heart failure Mother   . Heart attack Mother   . Hyperlipidemia Mother   . Hypertension Mother   . Hyperlipidemia Father   . Allergic rhinitis Father   . Rashes / Skin problems Father   . Sudden death Neg Hx   . Thyroid disease Neg Hx     BP 130/90 (BP Location: Right Arm, Patient Position: Sitting, Cuff Size: Large)   Pulse 84   Temp 98.3 F (36.8 C) (Oral)   Wt (!) 300 lb 9.6 oz (136.4 kg)    SpO2 97%   BMI 43.13 kg/m   Review of Systems He reports difficulty losing weight.      Objective:   Physical Exam VS: see vs page GEN: no distress.   NECK: I can again easily feel the right thyroid nodule.  No tenderness or erythema.       Assessment & Plan:  Thyroid nodule, clinically stable Neck swelling, worse per pt  Patient Instructions  blood tests are requested for you today.  We'll let you know about the results. Please see an ear-nose-throat specialist.  you will receive a phone call, about a day and time for an appointment.

## 2017-06-07 ENCOUNTER — Telehealth: Payer: Self-pay | Admitting: Endocrinology

## 2017-06-07 NOTE — Telephone Encounter (Signed)
Patient is requesting that office notes and ultrasound notes be faxed over to his Ear, Nose and Throat Doctor (Dr Lucia Gaskins)  Fax Number [phone]

## 2017-06-07 NOTE — Telephone Encounter (Signed)
completed

## 2017-06-18 DIAGNOSIS — R6884 Jaw pain: Secondary | ICD-10-CM | POA: Diagnosis not present

## 2017-06-18 DIAGNOSIS — H9041 Sensorineural hearing loss, unilateral, right ear, with unrestricted hearing on the contralateral side: Secondary | ICD-10-CM | POA: Diagnosis not present

## 2017-06-18 DIAGNOSIS — H9313 Tinnitus, bilateral: Secondary | ICD-10-CM | POA: Diagnosis not present

## 2017-08-10 DIAGNOSIS — A799 Rickettsiosis, unspecified: Secondary | ICD-10-CM | POA: Diagnosis not present

## 2017-08-10 DIAGNOSIS — H9311 Tinnitus, right ear: Secondary | ICD-10-CM | POA: Diagnosis not present

## 2017-08-10 DIAGNOSIS — E041 Nontoxic single thyroid nodule: Secondary | ICD-10-CM | POA: Diagnosis not present

## 2017-08-10 DIAGNOSIS — Z0000 Encounter for general adult medical examination without abnormal findings: Secondary | ICD-10-CM | POA: Diagnosis not present

## 2017-08-18 ENCOUNTER — Encounter: Payer: Self-pay | Admitting: Family Medicine

## 2017-08-18 ENCOUNTER — Ambulatory Visit (INDEPENDENT_AMBULATORY_CARE_PROVIDER_SITE_OTHER): Payer: 59 | Admitting: Family Medicine

## 2017-08-18 DIAGNOSIS — M79672 Pain in left foot: Secondary | ICD-10-CM | POA: Diagnosis not present

## 2017-08-18 MED ORDER — METHYLPREDNISOLONE ACETATE 40 MG/ML IJ SUSP
40.0000 mg | Freq: Once | INTRAMUSCULAR | Status: AC
  Administered 2017-08-18: 40 mg via INTRA_ARTICULAR

## 2017-08-19 ENCOUNTER — Encounter: Payer: Self-pay | Admitting: Family Medicine

## 2017-08-19 NOTE — Progress Notes (Signed)
PCP: Sharyne Richters, MD  Subjective:   HPI: Patient is a 42 y.o. male here for left foot pain.  Patient reports he's had prior history of plantar fasciitis and current pain feels similar. He usually buys new pair of [place] which help but waited too long to buy his current pair. Pain for past few months has worsened, now 5/10 level but up to 10/10 and sharp with walking and prolonged standing. Has tried stretching, topical medications, home exercises, arch binder. No skin changes, numbness.  Past Medical History:  Diagnosis Date  . Kidney stone   . Rickettsia infection     Current Outpatient Medications on File Prior to Visit  Medication Sig Dispense Refill  . aspirin-acetaminophen-caffeine (EXCEDRIN MIGRAINE) 250-250-65 MG tablet Take by mouth.    . EPINEPHrine (EPIPEN 2-PAK) 0.3 mg/0.3 mL IJ SOAJ injection USE AS DIRECTED FOR LIFE THREATENING ALLERGIC REACTIONS 4 Device 3  . levocetirizine (XYZAL) 5 MG tablet Take by mouth.    . mometasone (NASONEX) 50 MCG/ACT nasal spray USE 1-2 SPRAYS IN EACH NOSTRIL ONCE DAILY 17 g 5  . montelukast (SINGULAIR) 10 MG tablet TAKE ONE TABLET ONCE DAILY 30 tablet 5  . testosterone enanthate (DELATESTRYL) 200 MG/ML injection Inject 200 mg into the muscle once a week. For IM use only     No current facility-administered medications on file prior to visit.     Past Surgical History:  Procedure Laterality Date  . CHOLECYSTECTOMY      Allergies  Allergen Reactions  . Ibuprofen Itching  . Pantoprazole Itching  . Tramadol Hives and Itching  . Gadobutrol Hives    Patient had MRI scan at [facility]. Patient called one hour after he left imaging facility to report two "blisters" that came up on "back" of lip.   . Gadolinium Derivatives Hives    Patient had MRI scan at [facility]. Patient called one hour after he left imaging facility to report two "blisters" that came up on "back" of lip.   Marland Kitchen Doxycycline Other (See Comments)   "heart arrythmia" and "dyspepsia"  . Guaifenesin & Derivatives Other (See Comments)    Reaction unknown    Social History   Socioeconomic History  . Marital status: Married    Spouse name: Not on file  . Number of children: Not on file  . Years of education: Not on file  . Highest education level: Not on file  Occupational History  . Not on file  Social Needs  . Financial resource strain: Not on file  . Food insecurity:    Worry: Not on file    Inability: Not on file  . Transportation needs:    Medical: Not on file    Non-medical: Not on file  Tobacco Use  . Smoking status: Never Smoker  . Smokeless tobacco: Never Used  Substance and Sexual Activity  . Alcohol use: No  . Drug use: No  . Sexual activity: Not on file  Lifestyle  . Physical activity:    Days per week: Not on file    Minutes per session: Not on file  . Stress: Not on file  Relationships  . Social connections:    Talks on phone: Not on file    Gets together: Not on file    Attends religious service: Not on file    Active member of club or organization: Not on file    Attends meetings of clubs or organizations: Not on file    Relationship status: Not on file  .  Intimate partner violence:    Fear of current or ex partner: Not on file    Emotionally abused: Not on file    Physically abused: Not on file    Forced sexual activity: Not on file  Other Topics Concern  . Not on file  Social History Narrative  . Not on file    Family History  Problem Relation Age of Onset  . Diabetes Mother   . Heart disease Mother   . Heart failure Mother   . Heart attack Mother   . Hyperlipidemia Mother   . Hypertension Mother   . Hyperlipidemia Father   . Allergic rhinitis Father   . Rashes / Skin problems Father   . Sudden death Neg Hx   . Thyroid disease Neg Hx     BP 126/85   Pulse 81   Ht 5\' 10"  (1.778 m)   BMI 43.13 kg/m   Review of Systems: See HPI above.     Objective:  Physical Exam:  Gen:  NAD, comfortable in exam room  Left foot/ankle: No gross deformity, swelling, ecchymoses FROM with 5/5 strength. TTP plantar fascia greatest at insertion on medial calcaneus. Negative ant drawer and talar tilt.   Negative syndesmotic compression. Negative calcaneal squeeze. Thompsons test negative. NV intact distally.   Assessment & Plan:  1. Left foot pain - 2/2 plantar fasciitis.  Reviewed home exercises, stretches.  Continue shoes with arch support, arch binders.  Injection given today.  Tylenol, ibuprofen if needed.  F/u prn.  After informed written consent timeout was performed.  Patient was seated on exam table.  Alcohol swab used to prep area and patient's left plantar fascia was injected with 66mL of 2:1 bupivicaine: depomedrol using ultrasound guidance.  Patient tolerated procedure well without immediate complications.

## 2017-08-19 NOTE — Assessment & Plan Note (Signed)
2/2 plantar fasciitis.  Reviewed home exercises, stretches.  Continue shoes with arch support, arch binders.  Injection given today.  Tylenol, ibuprofen if needed.  F/u prn.  After informed written consent timeout was performed.  Patient was seated on exam table.  Alcohol swab used to prep area and patient's left plantar fascia was injected with 25mL of 2:1 bupivicaine: depomedrol using ultrasound guidance.  Patient tolerated procedure well without immediate complications.

## 2017-09-16 ENCOUNTER — Other Ambulatory Visit: Payer: Self-pay

## 2017-09-16 ENCOUNTER — Encounter (HOSPITAL_BASED_OUTPATIENT_CLINIC_OR_DEPARTMENT_OTHER): Payer: Self-pay | Admitting: Emergency Medicine

## 2017-09-16 ENCOUNTER — Emergency Department (HOSPITAL_BASED_OUTPATIENT_CLINIC_OR_DEPARTMENT_OTHER): Payer: 59

## 2017-09-16 ENCOUNTER — Emergency Department (HOSPITAL_BASED_OUTPATIENT_CLINIC_OR_DEPARTMENT_OTHER)
Admission: EM | Admit: 2017-09-16 | Discharge: 2017-09-16 | Disposition: A | Discharge status: Home / Self Care | Payer: 59 | Attending: Emergency Medicine | Admitting: Emergency Medicine

## 2017-09-16 DIAGNOSIS — Z79899 Other long term (current) drug therapy: Secondary | ICD-10-CM | POA: Insufficient documentation

## 2017-09-16 DIAGNOSIS — N23 Unspecified renal colic: Secondary | ICD-10-CM | POA: Insufficient documentation

## 2017-09-16 DIAGNOSIS — R109 Unspecified abdominal pain: Secondary | ICD-10-CM | POA: Diagnosis not present

## 2017-09-16 DIAGNOSIS — R110 Nausea: Secondary | ICD-10-CM | POA: Diagnosis not present

## 2017-09-16 LAB — URINALYSIS, ROUTINE W REFLEX MICROSCOPIC
Bilirubin Urine: NEGATIVE
Glucose, UA: NEGATIVE mg/dL
Ketones, ur: NEGATIVE mg/dL
LEUKOCYTES UA: NEGATIVE
Nitrite: NEGATIVE
PROTEIN: NEGATIVE mg/dL
SPECIFIC GRAVITY, URINE: 1.015 (ref 1.005–1.030)
pH: 5.5 (ref 5.0–8.0)

## 2017-09-16 LAB — URINALYSIS, MICROSCOPIC (REFLEX)

## 2017-09-16 LAB — BASIC METABOLIC PANEL
ANION GAP: 9 (ref 5–15)
BUN: 16 mg/dL (ref 6–20)
CO2: 26 mmol/L (ref 22–32)
Calcium: 9 mg/dL (ref 8.9–10.3)
Chloride: 104 mmol/L (ref 98–111)
Creatinine, Ser: 0.93 mg/dL (ref 0.61–1.24)
GFR calc Af Amer: >60 mL/min (ref >60)
GFR calc non Af Amer: >60 mL/min (ref >60)
Glucose, Bld: 120 mg/dL — ABNORMAL HIGH (ref 70–99)
POTASSIUM: 4 mmol/L (ref 3.5–5.1)
Sodium: 139 mmol/L (ref 135–145)

## 2017-09-16 MED ORDER — TAMSULOSIN HCL 0.4 MG PO CAPS: 0.4000 mg | ORAL_CAPSULE | Freq: Every day | ORAL | 0 refills | Status: DC

## 2017-09-16 MED ORDER — CEPHALEXIN 250 MG PO CAPS
500.0000 mg | ORAL_CAPSULE | Freq: Once | ORAL | Status: AC
  Administered 2017-09-16: 500 mg via ORAL
  Filled 2017-09-16: qty 2

## 2017-09-16 MED ORDER — OXYCODONE-ACETAMINOPHEN 5-325 MG PO TABS: 1.0000 | ORAL_TABLET | ORAL | 0 refills | Status: DC | PRN

## 2017-09-16 MED ORDER — ONDANSETRON HCL 4 MG/2ML IJ SOLN
4.0000 mg | Freq: Once | INTRAMUSCULAR | Status: AC
  Administered 2017-09-16: 4 mg via INTRAVENOUS
  Filled 2017-09-16: qty 2

## 2017-09-16 MED ORDER — CEPHALEXIN 500 MG PO CAPS: 500.0000 mg | ORAL_CAPSULE | Freq: Three times a day (TID) | ORAL | 0 refills | Status: DC

## 2017-09-16 MED ORDER — OXYCODONE-ACETAMINOPHEN 5-325 MG PO TABS
1.0000 | ORAL_TABLET | Freq: Once | ORAL | Status: AC
  Administered 2017-09-16: 1 via ORAL
  Filled 2017-09-16: qty 1

## 2017-09-16 MED ORDER — HYDROMORPHONE HCL 1 MG/ML IJ SOLN
1.0000 mg | Freq: Once | INTRAMUSCULAR | Status: AC
  Administered 2017-09-16: 1 mg via INTRAVENOUS
  Filled 2017-09-16: qty 1

## 2017-09-16 NOTE — ED Notes (Signed)
ED Provider at bedside. 

## 2017-09-16 NOTE — ED Notes (Signed)
Patient transported to CT 

## 2017-09-16 NOTE — ED Triage Notes (Signed)
Pt states he woke up this morning around 3 am with pain in his left flank area  Pt has hx of kidney stones  Nausea without vomiting  Pt states he took a flomax earlier [employer]

## 2017-09-16 NOTE — ED Provider Notes (Signed)
Patient visit shared. Patient with history of kidney stones here for left flank pain. CT stone study with mild left Hydro, question recently passed stone. BMP with normal renal function. UA with many bacteria but no other findings for UTI. Patient without symptoms of UTI. Will send urine culture. Given bacteria present will treat with antibiotics. Discussed importance of outpatient follow-up, home care and return precautions.   Quintella Reichert, MD 09/16/17 [phone]

## 2017-09-16 NOTE — ED Notes (Signed)
Pt aware urine is needed but states he is unable to provide at this time

## 2017-09-16 NOTE — ED Provider Notes (Signed)
[facility] EMERGENCY DEPARTMENT Provider Note   CSN: [id number] Arrival date & time: 09/16/17  0540     History   Chief Complaint Chief Complaint  Patient presents with  . Flank Pain    HPI Brian [name] is a 42 y.o. male.  Patient presents to the emergency department for evaluation of flank pain.  Patient reports that he was awakened from sleep around 3 AM with severe left flank pain.  Pain is accompanied by nausea, no vomiting.  He reports a history of frequent kidney stones, this feels similar.  He took a Flomax earlier and the pain started to ease off but it is now coming back.  Pain starts in the left posterior flank area and radiates to the left groin.     Past Medical History:  Diagnosis Date  . Kidney stone   . Rickettsia infection     Patient Active Problem List   Diagnosis Date Noted  . Right thyroid nodule 04/09/2017  . Bilateral knee pain 07/01/2016  . Left foot pain 02/06/2016  . Gastroesophageal reflux disease without esophagitis 05/08/2014  . Thrombocytopenia, unspecified ([place]) 06/13/2013  . 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 Low back pain 05/12/2012  . Allergic rhinitis 06/03/2009    Past Surgical History:  Procedure Laterality Date  . CHOLECYSTECTOMY          Home Medications    Prior to Admission medications   Medication Sig Start Date End Date Taking? Authorizing Provider  tamsulosin (FLOMAX) 0.4 MG CAPS capsule Take 0.4 mg by mouth.   Yes [provider]  aspirin-acetaminophen-caffeine (EXCEDRIN MIGRAINE) [phone] MG tablet Take by mouth.    [provider]  EPINEPHrine (EPIPEN 2-PAK) 0.3 mg/0.3 mL IJ SOAJ injection USE AS DIRECTED FOR LIFE THREATENING ALLERGIC REACTIONS 04/30/15   Kozlow, Donnamarie Poag, MD  levocetirizine (XYZAL) 5 MG tablet Take by mouth.    [provider]  mometasone (NASONEX) 50 MCG/ACT nasal spray USE 1-2 SPRAYS IN EACH NOSTRIL ONCE DAILY 04/30/15   Kozlow, Donnamarie Poag, MD  montelukast  (SINGULAIR) 10 MG tablet TAKE ONE TABLET ONCE DAILY 04/30/15   Kozlow, Donnamarie Poag, MD  testosterone enanthate (DELATESTRYL) 200 MG/ML injection Inject 200 mg into the muscle once a week. For IM use only    [provider]    Family History Family History  Problem Relation Age of Onset  . Diabetes Mother   . Heart disease Mother   . Heart failure Mother   . Heart attack Mother   . Hyperlipidemia Mother   . Hypertension Mother   . Hyperlipidemia Father   . Allergic rhinitis Father   . Rashes / Skin problems Father   . Sudden death Neg Hx   . Thyroid disease Neg Hx     Social History Social History   Tobacco Use  . Smoking status: Never Smoker  . Smokeless tobacco: Never Used  Substance Use Topics  . Alcohol use: No  . Drug use: No     Allergies   Ibuprofen; Pantoprazole; Tramadol; Gadobutrol; Gadolinium derivatives; Doxycycline; and Guaifenesin & derivatives   Review of Systems Review of Systems  Genitourinary: Positive for flank pain.  All other systems reviewed and are negative.    Physical Exam Updated Vital Signs BP 132/85 (BP Location: Right Arm)   Pulse 78   Temp 97.8 F (36.6 C) (Oral)   Resp 18   Ht 5\' 10"  (1.778 m)   Wt 125.2 kg (276 lb)   SpO2 96%  BMI 39.60 kg/m   Physical Exam  Constitutional: He is oriented to person, place, and time. He appears well-developed and well-nourished. No distress.  HENT:  Head: Normocephalic and atraumatic.  Right Ear: Hearing normal.  Left Ear: Hearing normal.  Nose: Nose normal.  Mouth/Throat: Oropharynx is clear and moist and mucous membranes are normal.  Eyes: Pupils are equal, round, and reactive to light. Conjunctivae and EOM are normal.  Neck: Normal range of motion. Neck supple.  Cardiovascular: Regular rhythm, S1 normal and S2 normal. Exam reveals no gallop and no friction rub.  No murmur heard. Pulmonary/Chest: Effort normal and breath sounds normal. No respiratory distress. He exhibits no  tenderness.  Abdominal: Soft. Normal appearance and bowel sounds are normal. There is no hepatosplenomegaly. There is no tenderness. There is no rebound, no guarding, no tenderness at McBurney's point and negative Murphy's sign. No hernia.  Musculoskeletal: Normal range of motion.  Neurological: He is alert and oriented to person, place, and time. He has normal strength. No cranial nerve deficit or sensory deficit. Coordination normal. GCS eye subscore is 4. GCS verbal subscore is 5. GCS motor subscore is 6.  Skin: Skin is warm, dry and intact. No rash noted. No cyanosis.  Psychiatric: He has a normal mood and affect. His speech is normal and behavior is normal. Thought content normal.  Nursing note and vitals reviewed.    ED Treatments / Results  Labs (all labs ordered are listed, but only abnormal results are displayed) Labs Reviewed  BASIC METABOLIC PANEL - Abnormal; Notable for the following components:      Result Value   Glucose, Bld 120 (*)    All other components within normal limits  URINALYSIS, ROUTINE W REFLEX MICROSCOPIC    EKG None  Radiology No results found.  Procedures Procedures (including critical care time)  Medications Ordered in ED Medications  HYDROmorphone (DILAUDID) injection 1 mg (1 mg Intravenous Given 09/16/17 0604)  ondansetron (ZOFRAN) injection 4 mg (4 mg Intravenous Given 09/16/17 0601)     Initial Impression / Assessment and Plan / ED Course  I have reviewed the triage vital signs and the nursing notes.  Pertinent labs & imaging results that were available during my care of the patient were reviewed by me and considered in my medical decision making (see chart for details).    Patient presents to the ER for evaluation of right flank pain.  Patient reports sudden onset of severe right flank pain that awakened him from sleep.  Patient has had numerous kidney stones in the past, this feels similar.  He has never required instrumentation to remove  his stones in the past.  Patient administered IV analgesia with improvement of his pain.  Still awaiting urine.  Will sign out to oncoming ER physician to ensure that there is no concomitant infection.     Final Clinical Impressions(s) / ED Diagnoses   Final diagnoses:  Ureteral colic    ED Discharge Orders    None       Orpah Greek, MD 09/16/17 [phone]

## 2017-09-16 NOTE — ED Notes (Signed)
ED Provider at bedside doing bedside u/s.

## 2017-09-17 LAB — URINE CULTURE: Culture: NO GROWTH

## 2017-10-05 ENCOUNTER — Ambulatory Visit: Payer: 59 | Admitting: Endocrinology

## 2017-10-12 ENCOUNTER — Other Ambulatory Visit (HOSPITAL_BASED_OUTPATIENT_CLINIC_OR_DEPARTMENT_OTHER): Payer: Self-pay | Admitting: Nurse Practitioner

## 2017-10-12 ENCOUNTER — Ambulatory Visit (HOSPITAL_BASED_OUTPATIENT_CLINIC_OR_DEPARTMENT_OTHER)
Admission: RE | Admit: 2017-10-12 | Discharge: 2017-10-12 | Disposition: A | Discharge status: Home / Self Care | Payer: 59 | Source: Ambulatory Visit | Attending: Nurse Practitioner | Admitting: Nurse Practitioner

## 2017-10-12 DIAGNOSIS — N200 Calculus of kidney: Secondary | ICD-10-CM

## 2017-10-12 DIAGNOSIS — R109 Unspecified abdominal pain: Secondary | ICD-10-CM

## 2017-10-15 DIAGNOSIS — N200 Calculus of kidney: Secondary | ICD-10-CM | POA: Diagnosis not present

## 2017-10-15 DIAGNOSIS — R109 Unspecified abdominal pain: Secondary | ICD-10-CM | POA: Diagnosis not present

## 2017-10-15 DIAGNOSIS — R1032 Left lower quadrant pain: Secondary | ICD-10-CM | POA: Diagnosis not present

## 2017-11-02 DIAGNOSIS — M542 Cervicalgia: Secondary | ICD-10-CM | POA: Diagnosis not present

## 2017-11-02 DIAGNOSIS — K224 Dyskinesia of esophagus: Secondary | ICD-10-CM | POA: Diagnosis not present

## 2017-11-02 DIAGNOSIS — R221 Localized swelling, mass and lump, neck: Secondary | ICD-10-CM | POA: Diagnosis not present

## 2017-11-02 DIAGNOSIS — N23 Unspecified renal colic: Secondary | ICD-10-CM | POA: Diagnosis not present

## 2017-11-05 DIAGNOSIS — R109 Unspecified abdominal pain: Secondary | ICD-10-CM | POA: Diagnosis not present

## 2017-11-05 DIAGNOSIS — N135 Crossing vessel and stricture of ureter without hydronephrosis: Secondary | ICD-10-CM | POA: Diagnosis not present

## 2017-11-12 DIAGNOSIS — E041 Nontoxic single thyroid nodule: Secondary | ICD-10-CM | POA: Diagnosis not present

## 2017-11-12 DIAGNOSIS — H9313 Tinnitus, bilateral: Secondary | ICD-10-CM | POA: Diagnosis not present

## 2017-11-12 DIAGNOSIS — H903 Sensorineural hearing loss, bilateral: Secondary | ICD-10-CM | POA: Diagnosis not present

## 2017-11-30 DIAGNOSIS — E079 Disorder of thyroid, unspecified: Secondary | ICD-10-CM | POA: Diagnosis not present

## 2017-11-30 DIAGNOSIS — E041 Nontoxic single thyroid nodule: Secondary | ICD-10-CM | POA: Diagnosis not present

## 2017-12-08 DIAGNOSIS — L03221 Cellulitis of neck: Secondary | ICD-10-CM | POA: Diagnosis not present

## 2017-12-17 DIAGNOSIS — R509 Fever, unspecified: Secondary | ICD-10-CM | POA: Diagnosis not present

## 2017-12-17 DIAGNOSIS — E079 Disorder of thyroid, unspecified: Secondary | ICD-10-CM | POA: Diagnosis not present

## 2018-01-04 DIAGNOSIS — Z91018 Allergy to other foods: Secondary | ICD-10-CM | POA: Diagnosis not present

## 2018-01-04 DIAGNOSIS — S00521A Blister (nonthermal) of lip, initial encounter: Secondary | ICD-10-CM | POA: Diagnosis not present

## 2018-02-15 DIAGNOSIS — R319 Hematuria, unspecified: Secondary | ICD-10-CM | POA: Diagnosis not present

## 2018-02-15 DIAGNOSIS — N132 Hydronephrosis with renal and ureteral calculous obstruction: Secondary | ICD-10-CM | POA: Diagnosis not present

## 2018-02-15 DIAGNOSIS — R197 Diarrhea, unspecified: Secondary | ICD-10-CM | POA: Diagnosis not present

## 2018-02-15 DIAGNOSIS — I2510 Atherosclerotic heart disease of native coronary artery without angina pectoris: Secondary | ICD-10-CM | POA: Diagnosis not present

## 2018-02-15 DIAGNOSIS — R1084 Generalized abdominal pain: Secondary | ICD-10-CM | POA: Diagnosis not present

## 2018-02-15 DIAGNOSIS — Z888 Allergy status to other drugs, medicaments and biological substances status: Secondary | ICD-10-CM | POA: Diagnosis not present

## 2018-02-15 DIAGNOSIS — Z6841 Body Mass Index (BMI) 40.0 and over, adult: Secondary | ICD-10-CM | POA: Diagnosis not present

## 2018-02-15 DIAGNOSIS — M160 Bilateral primary osteoarthritis of hip: Secondary | ICD-10-CM | POA: Diagnosis not present

## 2018-02-15 DIAGNOSIS — N201 Calculus of ureter: Secondary | ICD-10-CM | POA: Diagnosis not present

## 2018-02-15 DIAGNOSIS — E890 Postprocedural hypothyroidism: Secondary | ICD-10-CM | POA: Diagnosis not present

## 2018-02-15 DIAGNOSIS — Z886 Allergy status to analgesic agent status: Secondary | ICD-10-CM | POA: Diagnosis not present

## 2018-02-15 DIAGNOSIS — K219 Gastro-esophageal reflux disease without esophagitis: Secondary | ICD-10-CM | POA: Diagnosis not present

## 2018-02-15 DIAGNOSIS — Z79899 Other long term (current) drug therapy: Secondary | ICD-10-CM | POA: Diagnosis not present

## 2018-02-15 DIAGNOSIS — R109 Unspecified abdominal pain: Secondary | ICD-10-CM | POA: Diagnosis not present

## 2018-02-15 DIAGNOSIS — Z791 Long term (current) use of non-steroidal anti-inflammatories (NSAID): Secondary | ICD-10-CM | POA: Diagnosis not present

## 2018-02-16 DIAGNOSIS — Z6841 Body Mass Index (BMI) 40.0 and over, adult: Secondary | ICD-10-CM | POA: Diagnosis not present

## 2018-02-16 DIAGNOSIS — K219 Gastro-esophageal reflux disease without esophagitis: Secondary | ICD-10-CM | POA: Diagnosis not present

## 2018-02-16 DIAGNOSIS — Z886 Allergy status to analgesic agent status: Secondary | ICD-10-CM | POA: Diagnosis not present

## 2018-02-16 DIAGNOSIS — E890 Postprocedural hypothyroidism: Secondary | ICD-10-CM | POA: Diagnosis not present

## 2018-02-16 DIAGNOSIS — N132 Hydronephrosis with renal and ureteral calculous obstruction: Secondary | ICD-10-CM | POA: Diagnosis not present

## 2018-02-16 DIAGNOSIS — N201 Calculus of ureter: Secondary | ICD-10-CM | POA: Diagnosis not present

## 2018-02-16 DIAGNOSIS — Z79899 Other long term (current) drug therapy: Secondary | ICD-10-CM | POA: Diagnosis not present

## 2018-02-16 DIAGNOSIS — Z888 Allergy status to other drugs, medicaments and biological substances status: Secondary | ICD-10-CM | POA: Diagnosis not present

## 2018-02-16 DIAGNOSIS — Z466 Encounter for fitting and adjustment of urinary device: Secondary | ICD-10-CM | POA: Diagnosis not present

## 2018-02-16 DIAGNOSIS — Z791 Long term (current) use of non-steroidal anti-inflammatories (NSAID): Secondary | ICD-10-CM | POA: Diagnosis not present

## 2018-02-19 DIAGNOSIS — R109 Unspecified abdominal pain: Secondary | ICD-10-CM | POA: Diagnosis not present

## 2018-02-19 DIAGNOSIS — Z881 Allergy status to other antibiotic agents status: Secondary | ICD-10-CM | POA: Diagnosis not present

## 2018-02-19 DIAGNOSIS — R1084 Generalized abdominal pain: Secondary | ICD-10-CM | POA: Diagnosis not present

## 2018-02-19 DIAGNOSIS — Z79899 Other long term (current) drug therapy: Secondary | ICD-10-CM | POA: Diagnosis not present

## 2018-02-19 DIAGNOSIS — E039 Hypothyroidism, unspecified: Secondary | ICD-10-CM | POA: Diagnosis not present

## 2018-02-19 DIAGNOSIS — Z87442 Personal history of urinary calculi: Secondary | ICD-10-CM | POA: Diagnosis not present

## 2018-02-19 DIAGNOSIS — Z886 Allergy status to analgesic agent status: Secondary | ICD-10-CM | POA: Diagnosis not present

## 2018-02-19 DIAGNOSIS — K760 Fatty (change of) liver, not elsewhere classified: Secondary | ICD-10-CM | POA: Diagnosis not present

## 2018-02-19 DIAGNOSIS — Z791 Long term (current) use of non-steroidal anti-inflammatories (NSAID): Secondary | ICD-10-CM | POA: Diagnosis not present

## 2018-02-19 DIAGNOSIS — J45909 Unspecified asthma, uncomplicated: Secondary | ICD-10-CM | POA: Diagnosis not present

## 2018-02-19 DIAGNOSIS — Z885 Allergy status to narcotic agent status: Secondary | ICD-10-CM | POA: Diagnosis not present

## 2018-02-19 DIAGNOSIS — N1330 Unspecified hydronephrosis: Secondary | ICD-10-CM | POA: Diagnosis not present

## 2018-02-19 DIAGNOSIS — R52 Pain, unspecified: Secondary | ICD-10-CM | POA: Diagnosis not present

## 2018-02-19 DIAGNOSIS — Z888 Allergy status to other drugs, medicaments and biological substances status: Secondary | ICD-10-CM | POA: Diagnosis not present

## 2018-03-01 DIAGNOSIS — E079 Disorder of thyroid, unspecified: Secondary | ICD-10-CM | POA: Diagnosis not present

## 2018-03-02 ENCOUNTER — Other Ambulatory Visit (HOSPITAL_BASED_OUTPATIENT_CLINIC_OR_DEPARTMENT_OTHER): Payer: Self-pay | Admitting: Specialist

## 2018-03-02 ENCOUNTER — Ambulatory Visit (HOSPITAL_BASED_OUTPATIENT_CLINIC_OR_DEPARTMENT_OTHER)
Admission: RE | Admit: 2018-03-02 | Discharge: 2018-03-02 | Disposition: A | Discharge status: Home / Self Care | Payer: 59 | Source: Ambulatory Visit | Attending: Specialist | Admitting: Specialist

## 2018-03-02 DIAGNOSIS — R221 Localized swelling, mass and lump, neck: Secondary | ICD-10-CM | POA: Diagnosis not present

## 2018-03-11 DIAGNOSIS — N200 Calculus of kidney: Secondary | ICD-10-CM | POA: Diagnosis not present

## 2018-03-11 DIAGNOSIS — N2889 Other specified disorders of kidney and ureter: Secondary | ICD-10-CM | POA: Diagnosis not present

## 2018-03-16 DIAGNOSIS — C770 Secondary and unspecified malignant neoplasm of lymph nodes of head, face and neck: Secondary | ICD-10-CM | POA: Diagnosis not present

## 2018-03-16 DIAGNOSIS — R221 Localized swelling, mass and lump, neck: Secondary | ICD-10-CM | POA: Diagnosis not present

## 2018-03-16 DIAGNOSIS — R590 Localized enlarged lymph nodes: Secondary | ICD-10-CM | POA: Diagnosis not present

## 2018-03-22 ENCOUNTER — Other Ambulatory Visit: Payer: Self-pay | Admitting: Specialist

## 2018-03-22 ENCOUNTER — Encounter: Payer: Self-pay | Admitting: *Deleted

## 2018-03-22 ENCOUNTER — Other Ambulatory Visit (HOSPITAL_COMMUNITY): Payer: Self-pay | Admitting: Specialist

## 2018-03-22 DIAGNOSIS — R109 Unspecified abdominal pain: Secondary | ICD-10-CM | POA: Diagnosis not present

## 2018-03-22 DIAGNOSIS — C801 Malignant (primary) neoplasm, unspecified: Secondary | ICD-10-CM | POA: Diagnosis not present

## 2018-03-22 NOTE — Progress Notes (Signed)
Reached out to [name] to introduce myself as the office RN Navigator and explain our new patient process. Reviewed the reason for their referral and scheduled their new patient appointment along with labs. Provided address and directions to the office including call back phone number. Reviewed with patient any concerns they may have or any possible barriers to attending their appointment.   Informed patient about my role as a navigator and that I will meet with them prior to their New Patient appointment and more fully discuss what services I can provide. At this time patient has no further questions or needs.

## 2018-03-23 ENCOUNTER — Inpatient Hospital Stay (HOSPITAL_BASED_OUTPATIENT_CLINIC_OR_DEPARTMENT_OTHER): Payer: 59 | Admitting: Hematology & Oncology

## 2018-03-23 ENCOUNTER — Encounter: Payer: Self-pay | Admitting: Hematology & Oncology

## 2018-03-23 ENCOUNTER — Other Ambulatory Visit: Payer: Self-pay

## 2018-03-23 ENCOUNTER — Encounter: Payer: Self-pay | Admitting: *Deleted

## 2018-03-23 ENCOUNTER — Inpatient Hospital Stay: Payer: 59 | Attending: Hematology & Oncology

## 2018-03-23 VITALS — BP 126/79 | HR 82 | Temp 97.6°F | Resp 20 | Wt 296.8 lb

## 2018-03-23 DIAGNOSIS — C342 Malignant neoplasm of middle lobe, bronchus or lung: Secondary | ICD-10-CM

## 2018-03-23 DIAGNOSIS — Z79899 Other long term (current) drug therapy: Secondary | ICD-10-CM | POA: Diagnosis not present

## 2018-03-23 DIAGNOSIS — C801 Malignant (primary) neoplasm, unspecified: Secondary | ICD-10-CM

## 2018-03-23 DIAGNOSIS — C770 Secondary and unspecified malignant neoplasm of lymph nodes of head, face and neck: Secondary | ICD-10-CM

## 2018-03-23 DIAGNOSIS — E890 Postprocedural hypothyroidism: Secondary | ICD-10-CM | POA: Diagnosis not present

## 2018-03-23 DIAGNOSIS — Z7982 Long term (current) use of aspirin: Secondary | ICD-10-CM | POA: Insufficient documentation

## 2018-03-23 LAB — CBC WITH DIFFERENTIAL (CANCER CENTER ONLY)
Abs Immature Granulocytes: 0.02 10*3/uL (ref 0.00–0.07)
Basophils Absolute: 0 10*3/uL (ref 0.0–0.1)
Basophils Relative: 1 %
Eosinophils Absolute: 0.1 10*3/uL (ref 0.0–0.5)
Eosinophils Relative: 1 %
HCT: 41.4 % (ref 39.0–52.0)
HEMOGLOBIN: 14.3 g/dL (ref 13.0–17.0)
Immature Granulocytes: 0 %
Lymphocytes Relative: 33 %
Lymphs Abs: 1.7 10*3/uL (ref 0.7–4.0)
MCH: 30 pg (ref 26.0–34.0)
MCHC: 34.5 g/dL (ref 30.0–36.0)
MCV: 86.8 fL (ref 80.0–100.0)
MONOS PCT: 10 %
Monocytes Absolute: 0.5 10*3/uL (ref 0.1–1.0)
Neutro Abs: 2.9 10*3/uL (ref 1.7–7.7)
Neutrophils Relative %: 55 %
Platelet Count: 198 10*3/uL (ref 150–400)
RBC: 4.77 MIL/uL (ref 4.22–5.81)
RDW: 12.7 % (ref 11.5–15.5)
WBC Count: 5.1 10*3/uL (ref 4.0–10.5)
nRBC: 0 % (ref 0.0–0.2)

## 2018-03-23 LAB — CMP (CANCER CENTER ONLY)
ALT: 26 U/L (ref 0–44)
AST: 21 U/L (ref 15–41)
Albumin: 4.9 g/dL (ref 3.5–5.0)
Alkaline Phosphatase: 88 U/L (ref 38–126)
Anion gap: 6 (ref 5–15)
BUN: 14 mg/dL (ref 6–20)
CHLORIDE: 102 mmol/L (ref 98–111)
CO2: 28 mmol/L (ref 22–32)
Calcium: 9.8 mg/dL (ref 8.9–10.3)
Creatinine: 0.89 mg/dL (ref 0.61–1.24)
GFR, Est AFR Am: >60 mL/min (ref >60)
GFR, Estimated: >60 mL/min (ref >60)
GLUCOSE: 91 mg/dL (ref 70–99)
Potassium: 4 mmol/L (ref 3.5–5.1)
Sodium: 136 mmol/L (ref 135–145)
Total Bilirubin: 1.2 mg/dL (ref 0.3–1.2)
Total Protein: 6.9 g/dL (ref 6.5–8.1)

## 2018-03-23 NOTE — Progress Notes (Signed)
Initial RN Navigator Patient Visit  Name: Brian [name] Date of Referral : 03/22/2018 - Self Referral Diagnosis: Adenocarcinoma unknown primary  Met with patient prior to their visit with MD. Hanley Seamen patient "Your Patient Navigator" handout which explains my role, areas in which I am able to help, and all the contact information for myself and the office. Also gave patient MD and Navigator business card. Reviewed with patient the general overview of expected course after initial diagnosis and time frame for all steps to be completed.  Patient completed visit with Dr. Marin Olp  After MD visit, patient will need  - PET scan - patient is already scheduled for Monday, Feb 10th.  - after PET further determination will be made for further biopsy  Patient understands all follow up procedures and expectations. They have my number to reach out for any further clarification or additional needs. Will call patient in 5-7 days to see if any further needs have presented, or if patient has any further questions or needs.

## 2018-03-23 NOTE — Progress Notes (Signed)
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 New Patient (Initial Visit)    Thyroidectomy Nov 30, 2017. Adenocarcinoma cervical node.  : I had cancer that was found in a  lymph node in my right neck.  HPI: [name] is well-known to our office.  He is a 43 year old white male.  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  He has been quite healthy.  He really has not had any problems with health issues.  He has had his gallbladder out.  He did have a right hemi-thyroidectomy back in October 2019.  The pathology appear to be normal.  He began to note some swelling and pain on the right side of the neck.  This actually began close after his surgery.  He has some fluid initially taken out.  He had a ultrasound done of his neck on 03/10/2018.  This showed multiple bulky right cervical lymph nodes.  The index lymph node measured 6.3 x 2.7 x 3.2 cm.  He then underwent a biopsy of 1 of these lymph nodes.  This was done on 03/16/2018.  The pathology report ([id number]) showed metastatic adenocarcinoma.  The immunohistochemical stains seem to suggest that this was metastatic disease from a lung cancer or thyroid cancer.  He has never smoked.  He has not had a cough.  He has had no weight loss.  He has had no fever or sweats.  He has had no rashes.  He has had no dysphasia or odynophagia.  He has had no hoarseness.  He did have some hearing loss with the right ear but this seems to be a little bit better.  He has had no leg swelling.  There is been no change in bowel or bladder habits.  Of note, he had a CT of the abdomen and pelvis on 02/19/2018.  This was relatively on remarkable.  The lower portion of the lungs looked unremarkable.  Liver looked okay.  There is no adenopathy.  His appetite is doing okay.  He has had no reflux.  Overall, his  performance status is ECOG 0.   Past Medical History:  Diagnosis Date  . Kidney stone   . Rickettsia infection   :  Past Surgical History:  Procedure Laterality Date  . CHOLECYSTECTOMY    :   Current Outpatient Medications:  .  aspirin-acetaminophen-caffeine (EXCEDRIN MIGRAINE) 250-250-65 MG tablet, Take by mouth., Disp: , Rfl:  .  EPINEPHrine (EPIPEN 2-PAK) 0.3 mg/0.3 mL IJ SOAJ injection, USE AS DIRECTED FOR LIFE THREATENING ALLERGIC REACTIONS, Disp: 4 Device, Rfl: 3 .  levocetirizine (XYZAL) 5 MG tablet, Take by mouth., Disp: , Rfl:  .  mometasone (NASONEX) 50 MCG/ACT nasal spray, USE 1-2 SPRAYS IN EACH NOSTRIL ONCE DAILY, Disp: 17 g, Rfl: 5 .  montelukast (SINGULAIR) 10 MG tablet, TAKE ONE TABLET ONCE DAILY, Disp: 30 tablet, Rfl: 5 .  cephALEXin (KEFLEX) 500 MG capsule, Take 1 capsule (500 mg total) by mouth 3 (three) times daily., Disp: 21 capsule, Rfl: 0 .  oxyCODONE-acetaminophen (PERCOCET) 5-325 MG tablet, Take 1-2 tablets by mouth every 4 (four) hours as needed., Disp: 20 tablet, Rfl: 0 .  tamsulosin (FLOMAX) 0.4 MG CAPS capsule, Take 0.4 mg by mouth., Disp: , Rfl:  .  tamsulosin (FLOMAX) 0.4 MG CAPS capsule, Take 1 capsule (0.4 mg total) by mouth daily. (Patient not taking:  Reported on 03/23/2018), Disp: 10 capsule, Rfl: 0 .  testosterone enanthate (DELATESTRYL) 200 MG/ML injection, Inject 200 mg into the muscle once a week. For IM use only, Disp: , Rfl: :  :  Allergies  Allergen Reactions  . Ibuprofen Itching  . Pantoprazole Itching  . Tramadol Hives and Itching  . Gadobutrol Hives    Patient had MRI scan at [facility]. Patient called one hour after he left imaging facility to report two "blisters" that came up on "back" of lip.   . Gadolinium Derivatives Hives    Patient had MRI scan at [facility]. Patient called one hour after he left imaging facility to report two "blisters" that came up on "back" of lip.   Marland Kitchen Doxycycline Other (See Comments)    "heart  arrythmia" and "dyspepsia"  . Guaifenesin & Derivatives Other (See Comments)    Reaction unknown  :  Family History  Problem Relation Age of Onset  . Diabetes Mother   . Heart disease Mother   . Heart failure Mother   . Heart attack Mother   . Hyperlipidemia Mother   . Hypertension Mother   . Hyperlipidemia Father   . Allergic rhinitis Father   . Rashes / Skin problems Father   . Sudden death Neg Hx   . Thyroid disease Neg Hx   :  Social History   Socioeconomic History  . Marital status: Married    Spouse name: Not on file  . Number of children: Not on file  . Years of education: Not on file  . Highest education level: Not on file  Occupational History  . Not on file  Social Needs  . Financial resource strain: Not on file  . Food insecurity:    Worry: Not on file    Inability: Not on file  . Transportation needs:    Medical: Not on file    Non-medical: Not on file  Tobacco Use  . Smoking status: Never Smoker  . Smokeless tobacco: Never Used  Substance and Sexual Activity  . Alcohol use: No  . Drug use: No  . Sexual activity: Not on file  Lifestyle  . Physical activity:    Days per week: Not on file    Minutes per session: Not on file  . Stress: Not on file  Relationships  . Social connections:    Talks on phone: Not on file    Gets together: Not on file    Attends religious service: Not on file    Active member of club or organization: Not on file    Attends meetings of clubs or organizations: Not on file    Relationship status: Not on file  . Intimate partner violence:    Fear of current or ex partner: Not on file    Emotionally abused: Not on file    Physically abused: Not on file    Forced sexual activity: Not on file  Other Topics Concern  . Not on file  Social History Narrative  . Not on file  :  Review of Systems  Constitutional: Negative.   HENT: Negative.   Eyes: Negative.   Respiratory: Negative.   Cardiovascular: Negative.    Gastrointestinal: Negative.   Genitourinary: Negative.   Musculoskeletal: Negative.   Skin: Negative.   Neurological: Negative.   Endo/Heme/Allergies: Negative.   Psychiatric/Behavioral: Negative.      Exam: Well-developed and well-nourished white male in no obvious distress.  Vital signs show temperature of 97.6.  Pulse 82.  Respiratory rate 20.  Blood pressure 126/79.  Weight is 300 pounds.  Head neck exam shows no ocular or oral lesions.  There is no scleral icterus.  He has some enlarged tonsils, more so on the right than left palatine tonsils.  He does have adenopathy in the right neck.  This appears to be more anterior to the sternocleidomastoid muscle.  The lymph nodes are enlarged firm and non-mobile.  I cannot palpate any adenopathy in the left neck or bilateral supraclavicular fossa.  His lungs are clear bilaterally.  Cardiac exam regular rate and rhythm with no murmurs, rubs or bruits.  Abdomen is soft.  His good bowel sounds.  He is moderately obese.  He has no fluid wave.  There is no palpable liver or spleen tip.  Back exam shows no tenderness over the spine, ribs or hips.  Extremities shows no clubbing, cyanosis or edema.  He has good range of motion of his joints.  I cannot palpate any venous cord in his legs.  Axillary exam shows no bilateral axillary adenopathy.  Breast exam shows no breast masses, edema or erythema bilaterally.  Skin exam shows no rashes, ecchymoses or petechia.  Neurological exam shows no focal neurological deficits. @IPVITALS @   Recent Labs    03/23/18 1509  WBC 5.1  HGB 14.3  HCT 41.4  PLT 198   Recent Labs    03/23/18 1509  NA 136  K 4.0  CL 102  CO2 28  GLUCOSE 91  BUN 14  CREATININE 0.89  CALCIUM 9.8    Blood smear review: None  Pathology: See above    Assessment and Plan: [name] is a very nice 43 year old white male.  He is been quite healthy.  He had a benign thyroid nodule found on a right hemithyroidectomy.  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  It is unclear as to what the primary is.  There are not many malignancies with adenocarcinoma that occur in the neck.  One would really have to think about lung cancer even though he is a non-smoker.  Thyroid cancer is certainly a possibility despite the fact that he was found to have a benign thyroid nodule.  We will see what his thyroglobulin level is.  Salivary gland cancer is always a concern.  This is very rare but yet it is a malignancy that we have to think about.  He is set up for a PET scan on Monday.  This will hopefully give [place] an idea as to what the primary site might be.  If we cannot find a primary site, I probably will see if we cannot get additional biopsies.  I would then have to send the tumor off for molecular array testing to see if a primary could be identified.  We will definitely have to send off material for molecular profiling to see if there is any actionable mutations that we can target.  He is very difficult to really talk about prognosis and treatment options as of yet since we do not know the extent of disease nor do we know the primary site of this malignancy.  [name] and his wife are both very well versed in medicine.  They certainly understand quite well what we are dealing with.  They asked a lot of good questions.  I spent about an hour with [name] and his wife.  I answered all their questions.  I reviewed his lab reports and his pathology  report.  I gave him a prayer blanket which he was incredibly thankful for.  Again, we will have to see what the PET scan shows.  This will really dictate how we have to proceed.

## 2018-03-24 LAB — LACTATE DEHYDROGENASE: LDH: 219 U/L — ABNORMAL HIGH (ref 98–192)

## 2018-03-24 LAB — CEA (IN HOUSE-CHCC): CEA (CHCC-In House): 1.31 ng/mL (ref 0.00–5.00)

## 2018-03-28 ENCOUNTER — Ambulatory Visit (HOSPITAL_COMMUNITY)
Admission: RE | Admit: 2018-03-28 | Discharge: 2018-03-28 | Disposition: A | Discharge status: Home / Self Care | Payer: 59 | Source: Ambulatory Visit | Attending: Specialist | Admitting: Specialist

## 2018-03-28 DIAGNOSIS — C760 Malignant neoplasm of head, face and neck: Secondary | ICD-10-CM | POA: Diagnosis not present

## 2018-03-28 DIAGNOSIS — C801 Malignant (primary) neoplasm, unspecified: Secondary | ICD-10-CM | POA: Diagnosis not present

## 2018-03-28 LAB — GLUCOSE, CAPILLARY: Glucose-Capillary: 82 mg/dL (ref 70–99)

## 2018-03-28 MED ORDER — FLUDEOXYGLUCOSE F - 18 (FDG) INJECTION
15.1900 | Freq: Once | INTRAVENOUS | Status: AC | PRN
  Administered 2018-03-28: 15.19 via INTRAVENOUS

## 2018-03-29 ENCOUNTER — Other Ambulatory Visit: Payer: Self-pay | Admitting: Family

## 2018-03-29 ENCOUNTER — Encounter: Payer: Self-pay | Admitting: *Deleted

## 2018-03-29 ENCOUNTER — Ambulatory Visit (HOSPITAL_COMMUNITY): Admission: RE | Admit: 2018-03-29 | Payer: 59 | Source: Ambulatory Visit

## 2018-03-29 ENCOUNTER — Other Ambulatory Visit: Payer: Self-pay | Admitting: Gastroenterology

## 2018-03-29 DIAGNOSIS — D4989 Neoplasm of unspecified behavior of other specified sites: Secondary | ICD-10-CM

## 2018-03-29 NOTE — Progress Notes (Signed)
Followed up with patient today. He had his PET scan yesterday which shows a possible esophageal primary, but unfortunately also shows stage IV disease. Dr Marin Olp had called patient to give him results. Also ordered was referral to GI for endoscopy and MRI of brain. Dr Marin Olp spoke to Dr Wallis Mart personally.  Spoke to patient to follow up. He is doing okay. He had already received his appointment from Dr Bucchini's office and will see them this week. His MRI is ordered and scheduled for this evening. We will follow up with those results when they come in.  Patient knows to call me with any additional issues or questions.

## 2018-03-30 ENCOUNTER — Encounter (HOSPITAL_COMMUNITY): Payer: Self-pay | Admitting: Radiology

## 2018-03-30 ENCOUNTER — Ambulatory Visit (HOSPITAL_COMMUNITY)
Admission: RE | Admit: 2018-03-30 | Discharge: 2018-03-30 | Disposition: A | Discharge status: Home / Self Care | Payer: 59 | Source: Ambulatory Visit | Attending: Family | Admitting: Family

## 2018-03-30 DIAGNOSIS — C760 Malignant neoplasm of head, face and neck: Secondary | ICD-10-CM | POA: Diagnosis not present

## 2018-03-30 DIAGNOSIS — C7931 Secondary malignant neoplasm of brain: Secondary | ICD-10-CM | POA: Diagnosis not present

## 2018-03-30 DIAGNOSIS — D4989 Neoplasm of unspecified behavior of other specified sites: Secondary | ICD-10-CM | POA: Diagnosis not present

## 2018-03-30 DIAGNOSIS — R948 Abnormal results of function studies of other organs and systems: Secondary | ICD-10-CM | POA: Diagnosis not present

## 2018-03-30 MED ORDER — GADOBUTROL 1 MMOL/ML IV SOLN
10.0000 mL | Freq: Once | INTRAVENOUS | Status: AC | PRN
  Administered 2018-03-30: 10 mL via INTRAVENOUS

## 2018-03-31 ENCOUNTER — Ambulatory Visit: Payer: 59 | Admitting: Hematology & Oncology

## 2018-03-31 ENCOUNTER — Ambulatory Visit (HOSPITAL_COMMUNITY): Payer: 59

## 2018-03-31 ENCOUNTER — Other Ambulatory Visit: Payer: 59

## 2018-03-31 ENCOUNTER — Encounter: Payer: Self-pay | Admitting: *Deleted

## 2018-03-31 NOTE — Progress Notes (Signed)
Notified patient of following result  call - MRI of brain is normal!! Brian [name]  His endoscopy with biopsy is scheduled for 04/05/2018.

## 2018-04-04 ENCOUNTER — Encounter (HOSPITAL_COMMUNITY): Payer: Self-pay | Admitting: *Deleted

## 2018-04-04 ENCOUNTER — Other Ambulatory Visit: Payer: Self-pay

## 2018-04-05 ENCOUNTER — Encounter (HOSPITAL_COMMUNITY): Payer: Self-pay | Admitting: *Deleted

## 2018-04-05 ENCOUNTER — Ambulatory Visit (HOSPITAL_COMMUNITY): Payer: 59 | Admitting: Physician Assistant

## 2018-04-05 ENCOUNTER — Encounter (HOSPITAL_COMMUNITY)
Admission: RE | Disposition: A | Discharge status: Home / Self Care | Payer: Self-pay | Source: Home / Self Care | Attending: Gastroenterology

## 2018-04-05 ENCOUNTER — Ambulatory Visit (HOSPITAL_COMMUNITY)
Admission: RE | Admit: 2018-04-05 | Discharge: 2018-04-05 | Disposition: A | Discharge status: Home / Self Care | Payer: 59 | Attending: Gastroenterology | Admitting: Gastroenterology

## 2018-04-05 DIAGNOSIS — K228 Other specified diseases of esophagus: Secondary | ICD-10-CM | POA: Diagnosis not present

## 2018-04-05 DIAGNOSIS — C801 Malignant (primary) neoplasm, unspecified: Secondary | ICD-10-CM | POA: Insufficient documentation

## 2018-04-05 DIAGNOSIS — C770 Secondary and unspecified malignant neoplasm of lymph nodes of head, face and neck: Secondary | ICD-10-CM | POA: Insufficient documentation

## 2018-04-05 DIAGNOSIS — Z79890 Hormone replacement therapy (postmenopausal): Secondary | ICD-10-CM | POA: Diagnosis not present

## 2018-04-05 DIAGNOSIS — K219 Gastro-esophageal reflux disease without esophagitis: Secondary | ICD-10-CM | POA: Diagnosis not present

## 2018-04-05 DIAGNOSIS — R933 Abnormal findings on diagnostic imaging of other parts of digestive tract: Secondary | ICD-10-CM | POA: Diagnosis not present

## 2018-04-05 DIAGNOSIS — K3189 Other diseases of stomach and duodenum: Secondary | ICD-10-CM | POA: Diagnosis not present

## 2018-04-05 HISTORY — PX: BIOPSY: SHX5522

## 2018-04-05 HISTORY — PX: ESOPHAGOGASTRODUODENOSCOPY (EGD) WITH PROPOFOL: SHX5813

## 2018-04-05 LAB — THYROGLOBULIN LEVEL: Thyroglobulin: 4 ng/mL

## 2018-04-05 SURGERY — ESOPHAGOGASTRODUODENOSCOPY (EGD) WITH PROPOFOL
Anesthesia: Monitor Anesthesia Care

## 2018-04-05 MED ORDER — SODIUM CHLORIDE 0.9 % IV SOLN: INTRAVENOUS | Status: DC

## 2018-04-05 MED ORDER — PROPOFOL 500 MG/50ML IV EMUL
INTRAVENOUS | Status: DC | PRN
  Administered 2018-04-05: 50 mg via INTRAVENOUS

## 2018-04-05 MED ORDER — PROPOFOL 500 MG/50ML IV EMUL
INTRAVENOUS | Status: DC | PRN
  Administered 2018-04-05: 100 ug/kg/min via INTRAVENOUS

## 2018-04-05 MED ORDER — LACTATED RINGERS IV SOLN
INTRAVENOUS | Status: DC
  Administered 2018-04-05: 1000 mL via INTRAVENOUS

## 2018-04-05 MED ORDER — ONDANSETRON HCL 4 MG/2ML IJ SOLN
INTRAMUSCULAR | Status: DC | PRN
  Administered 2018-04-05: 4 mg via INTRAVENOUS

## 2018-04-05 SURGICAL SUPPLY — 14 items

## 2018-04-05 NOTE — Transfer of Care (Signed)
Immediate Anesthesia Transfer of Care Note  Patient: Brian [name]  Procedure(s) Performed: Procedure(s): ESOPHAGOGASTRODUODENOSCOPY (EGD) WITH PROPOFOL (N/A) BIOPSY  Patient Location: PACU  Anesthesia Type:MAC  Level of Consciousness:  sedated, patient cooperative and responds to stimulation  Airway & Oxygen Therapy:Patient Spontanous Breathing and Patient connected to face mask oxgen  Post-op Assessment:  Report given to PACU RN and Post -op Vital signs reviewed and stable  Post vital signs:  Reviewed and stable  Last Vitals:  Vitals:   04/05/18 0750  BP: 118/85  Pulse: 74  Resp: 14  Temp: 36.8 C  SpO2: 295%    Complications: No apparent anesthesia complications

## 2018-04-05 NOTE — Anesthesia Postprocedure Evaluation (Signed)
Anesthesia Post Note  Patient: Brian [name]  Procedure(s) Performed: ESOPHAGOGASTRODUODENOSCOPY (EGD) WITH PROPOFOL (N/A ) BIOPSY     Patient location during evaluation: Endoscopy Anesthesia Type: MAC Level of consciousness: awake Pain management: pain level controlled Vital Signs Assessment: post-procedure vital signs reviewed and stable Cardiovascular status: stable Anesthetic complications: no    Last Vitals:  Vitals:   04/05/18 0920 04/05/18 0930  BP: (!) 133/96 (!) 137/95  Pulse: 60 69  Resp: 16 17  Temp:    SpO2: 100% 99%    Last Pain:  Vitals:   04/05/18 0930  TempSrc:   PainSc: 0-No pain                 Kourosh Jablonsky

## 2018-04-05 NOTE — Anesthesia Preprocedure Evaluation (Addendum)
Anesthesia Evaluation  Patient identified by MRN, date of birth, ID band Patient awake    Reviewed: Allergy & Precautions, NPO status , Patient's Chart, lab work & pertinent test results  Airway Mallampati: II  TM Distance: >3 FB     Dental   Pulmonary    breath sounds clear to auscultation       Cardiovascular negative cardio ROS   Rhythm:Regular Rate:Normal     Neuro/Psych    GI/Hepatic GERD  ,GI history noted. CG   Endo/Other  negative endocrine ROS  Renal/GU Renal disease     Musculoskeletal   Abdominal   Peds  Hematology   Anesthesia Other Findings   Reproductive/Obstetrics                             Anesthesia Physical Anesthesia Plan  ASA: III  Anesthesia Plan: MAC   Post-op Pain Management:    Induction: Intravenous  PONV Risk Score and Plan: 1 and Ondansetron, Dexamethasone, Midazolam and Treatment may vary due to age or medical condition  Airway Management Planned: Nasal Cannula and Simple Face Mask  Additional Equipment:   Intra-op Plan:   Post-operative Plan:   Informed Consent: I have reviewed the patients History and Physical, chart, labs and discussed the procedure including the risks, benefits and alternatives for the proposed anesthesia with the patient or authorized representative who has indicated his/her understanding and acceptance.     Dental advisory given  Plan Discussed with: CRNA and Anesthesiologist  Anesthesia Plan Comments:         Anesthesia Quick Evaluation

## 2018-04-05 NOTE — Op Note (Signed)
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 Providers:                Ronald Lobo, MD, Vista Lawman, RN, Cletis Athens,                            Technician, Arnoldo Hooker, CRNA Referring MD:              Medicines:                Monitored Anesthesia Care Complications:            No immediate complications. Estimated Blood Loss:     Estimated blood loss was minimal. Procedure:                Pre-Anesthesia Assessment:                           - Prior to the procedure, a History and Physical                            was performed, and patient medications and                            allergies were reviewed. The patient's tolerance of                            previous anesthesia was also reviewed. The risks                            and benefits of the procedure and the sedation                            options and risks were discussed with the patient.                            All questions were answered, and informed consent                            was obtained. Prior Anticoagulants: The patient has                            taken no previous anticoagulant or antiplatelet  agents. ASA Grade Assessment: III - A patient with                            severe systemic disease. After reviewing the risks                            and benefits, the patient was deemed in                            satisfactory condition to undergo the procedure.                           After obtaining informed consent, the endoscope was   passed under direct vision. Throughout the                            procedure, the patient's blood pressure, pulse, and                            oxygen saturations were monitored continuously. The                            [id number]-H190 ([id number]) Olympus gastroscope was                            introduced through the mouth, and advanced to the                            second part of duodenum. The upper GI endoscopy was                            accomplished without difficulty. The patient                            tolerated the procedure well. Scope In: Scope Out: Findings:      Two minimal tongues of salmon-colored mucosa were present. At the distal       end of one of these apparent tongues, a 1 cm erythematous nodule was       present. It had a benign appearance and was not particularly impressive       in appearance, but given this patient's abnormal PET scan and history of       adenocarcinoma, it was felt that it needed to be biopsied. The maximum       longitudinal extent of these esophageal mucosal changes was 1 cm in       length. Biopsies were taken with a cold forceps for histology both from       the nodule and from the apparent short-segment Barrett's segment.       Estimated blood loss was minimal.      Diffuse glycogenic acanthosis was found in the middle third of the       esophagus. Biopsies were taken with a cold forceps for histology.       Estimated blood loss was minimal.      The entire examined stomach was normal.      The cardia and  gastric fundus were normal on retroflexion.      The examined duodenum was normal. Impression:               - Benign appearing distal esophageal nodule, with                            salmon-colored mucosa consistent with short-segment                            Barrett's esophagus. Biopsied.                           - Glycogenic acanthosis of the esophagus. Biopsied.                           - Normal stomach.                            - Normal examined duodenum. Moderate Sedation:      This patient was sedated with monitored anesthesia care, not moderate       sedation. Recommendation:           - Await pathology results. Procedure Code(s):        --- Professional ---                           [phone], Esophagogastroduodenoscopy, flexible,                            transoral; with biopsy, single or multiple Diagnosis Code(s):        --- Professional ---                           K22.8, Other specified diseases of esophagus                           R93.3, Abnormal findings on diagnostic imaging of                            other parts of digestive tract CPT copyright 2018 American Medical Association. All rights reserved. The codes documented in this report are preliminary and upon coder review may  be revised to meet current compliance requirements. Ronald Lobo, MD 04/05/2018 9:16:24 AM This report has been signed electronically. Number of Addenda: 0

## 2018-04-05 NOTE — H&P (Signed)
Brian [name] is an 43 y.o. male.   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  The patient does not have any esophageal symptoms.  Past Medical History:  Diagnosis Date  . Kidney stone   . Rickettsia infection     Past Surgical History:  Procedure Laterality Date  . CHOLECYSTECTOMY      Family History  Problem Relation Age of Onset  . Diabetes Mother   . Heart disease Mother   . Heart failure Mother   . Heart attack Mother   . Hyperlipidemia Mother   . Hypertension Mother   . Hyperlipidemia Father   . Allergic rhinitis Father   . Rashes / Skin problems Father   . Sudden death Neg Hx   . Thyroid disease Neg Hx    Social History:  reports that he has never smoked. He has never used smokeless tobacco. He reports that he does not drink alcohol or use drugs.  Allergies:  Allergies  Allergen Reactions  . Ibuprofen Itching  . Pantoprazole Itching  . Tramadol Hives and Itching  . Gadobutrol Hives and Other (See Comments)    Patient had MRI scan at [facility]. Patient called one hour after he left imaging facility to report two "blisters" that came up on "back" of lip.   . Gadolinium Derivatives Hives and Other (See Comments)    Patient had MRI scan at [facility]. Patient called one hour after he left imaging facility to report two "blisters" that came up on "back" of lip.   Marland Kitchen Doxycycline Other (See Comments)    "heart arrythmia" and "dyspepsia"  . Guaifenesin & Derivatives Other (See Comments)    Reaction unknown    Medications Prior to Admission  Medication Sig Dispense Refill  . levothyroxine (SYNTHROID, LEVOTHROID) 25 MCG tablet Take 25 mcg by mouth daily before breakfast.    . OVER THE COUNTER MEDICATION Take 1 Scoop by mouth daily. Barley Green Powder    . EPINEPHrine (EPIPEN 2-PAK)  0.3 mg/0.3 mL IJ SOAJ injection USE AS DIRECTED FOR LIFE THREATENING ALLERGIC REACTIONS (Patient taking differently: Inject 0.3 mg into the muscle as needed for anaphylaxis. ) 4 Device 3    No results found for this or any previous visit (from the past 48 hour(s)). No results found.  ROS no dysphagia or reflux, no lower GI symptoms.  Recently status post resection of a right-sided benign thyroid mass which was complicated by some fluid retention in that area.  Blood pressure 118/85, pulse 74, temperature 98.2 F (36.8 C), temperature source Oral, resp. rate 14, height 5\' 10"  (1.778 m), weight 127.9 kg, SpO2 100 %. Physical Exam overweight but generally healthy-appearing, pleasant Caucasian male.  Chest clear, heart has irregular rhythm but no murmur or gallop.  Abdomen without mass or tenderness, but significantly obese.  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  Will proceed to endoscopic evaluation today, the nature, purpose, and risks of which have been reviewed with the patient and his wife.  Cleotis Nipper, MD 04/05/2018, 8:37 AM

## 2018-04-05 NOTE — Discharge Instructions (Signed)

## 2018-04-06 ENCOUNTER — Encounter: Payer: Self-pay | Admitting: *Deleted

## 2018-04-07 ENCOUNTER — Encounter (HOSPITAL_COMMUNITY): Payer: Self-pay | Admitting: Gastroenterology

## 2018-04-11 ENCOUNTER — Other Ambulatory Visit: Payer: Self-pay | Admitting: Hematology & Oncology

## 2018-04-11 ENCOUNTER — Inpatient Hospital Stay: Payer: 59

## 2018-04-11 DIAGNOSIS — C801 Malignant (primary) neoplasm, unspecified: Secondary | ICD-10-CM | POA: Diagnosis not present

## 2018-04-11 DIAGNOSIS — E890 Postprocedural hypothyroidism: Secondary | ICD-10-CM | POA: Diagnosis not present

## 2018-04-11 DIAGNOSIS — Z77120 Contact with and (suspected) exposure to mold (toxic): Secondary | ICD-10-CM

## 2018-04-11 DIAGNOSIS — Z79899 Other long term (current) drug therapy: Secondary | ICD-10-CM | POA: Diagnosis not present

## 2018-04-11 DIAGNOSIS — Z7982 Long term (current) use of aspirin: Secondary | ICD-10-CM | POA: Diagnosis not present

## 2018-04-11 DIAGNOSIS — C770 Secondary and unspecified malignant neoplasm of lymph nodes of head, face and neck: Secondary | ICD-10-CM | POA: Diagnosis not present

## 2018-04-12 DIAGNOSIS — C770 Secondary and unspecified malignant neoplasm of lymph nodes of head, face and neck: Secondary | ICD-10-CM | POA: Diagnosis not present

## 2018-04-12 DIAGNOSIS — R591 Generalized enlarged lymph nodes: Secondary | ICD-10-CM | POA: Diagnosis not present

## 2018-04-14 LAB — FUNGAL ANTIBODIES PANEL, ID-BLOOD
Aspergillus flavus: NEGATIVE
Aspergillus fumigatus, IgG: NEGATIVE
Aspergillus niger: NEGATIVE
Blastomyces Abs, Qn, DID: NEGATIVE
Histoplasma Ab, Immunodiffusion: NEGATIVE

## 2018-04-15 ENCOUNTER — Encounter: Payer: Self-pay | Admitting: *Deleted

## 2018-04-15 NOTE — Progress Notes (Signed)
Called patient to give the following results  "Notes recorded by [name], MD on 04/15/2018 at 7:02 AM EST Call - the fungal panel is negative!! Brian [name]"  Patient had his excisional biopsy on Tuesday. He is recovering well. We do not yet have pathology. He has no needs at this time. Will follow up next week once we get pathology results.

## 2018-04-18 ENCOUNTER — Encounter: Payer: Self-pay | Admitting: *Deleted

## 2018-04-18 NOTE — Progress Notes (Signed)
Patient had excisional biopsy early last week and we have not received any pathology. Called Dr Erline Levine office for results. They state they do not yet have pathology report to send. Fax and phone number given to them requesting to get copy ASAP once they receive report.  Will follow up this week for results.

## 2018-04-21 ENCOUNTER — Encounter: Payer: Self-pay | Admitting: *Deleted

## 2018-04-21 DIAGNOSIS — E079 Disorder of thyroid, unspecified: Secondary | ICD-10-CM | POA: Diagnosis not present

## 2018-04-21 DIAGNOSIS — R000 Tachycardia, unspecified: Secondary | ICD-10-CM | POA: Diagnosis not present

## 2018-04-26 ENCOUNTER — Ambulatory Visit (INDEPENDENT_AMBULATORY_CARE_PROVIDER_SITE_OTHER): Payer: 59 | Admitting: Cardiology

## 2018-04-26 VITALS — BP 112/70 | HR 55 | Ht 70.0 in | Wt 276.1 lb

## 2018-04-26 DIAGNOSIS — R0789 Other chest pain: Secondary | ICD-10-CM | POA: Insufficient documentation

## 2018-04-26 DIAGNOSIS — R0609 Other forms of dyspnea: Secondary | ICD-10-CM | POA: Diagnosis not present

## 2018-04-26 DIAGNOSIS — R0600 Dyspnea, unspecified: Secondary | ICD-10-CM | POA: Insufficient documentation

## 2018-04-26 DIAGNOSIS — E041 Nontoxic single thyroid nodule: Secondary | ICD-10-CM | POA: Diagnosis not present

## 2018-04-26 DIAGNOSIS — I4949 Other premature depolarization: Secondary | ICD-10-CM | POA: Insufficient documentation

## 2018-04-26 NOTE — Patient Instructions (Signed)
Medication Instructions:  Your physician recommends that you continue on your current medications as directed. Please refer to the Current Medication list given to you today.  If you need a refill on your cardiac medications before your next appointment, please call your pharmacy.   Lab work: None ordered If you have labs (blood work) drawn today and your tests are completely normal, you will receive your results only by: Marland Kitchen MyChart Message (if you have MyChart) OR . A paper copy in the mail If you have any lab test that is abnormal or we need to change your treatment, we will call you to review the results.  Testing/Procedures: Your physician has requested that you have an echocardiogram. Echocardiography is a painless test that uses sound waves to create images of your heart. It provides your doctor with information about the size and shape of your heart and how well your heart's chambers and valves are working. This procedure takes approximately one hour. There are no restrictions for this procedure.  Your physician has requested that you have a stress echocardiogram. For further information please visit [email or web address]. Please follow instruction sheet as given.  Your physician has recommended that you wear a holter monitor. Holter monitors are medical devices that record the heart's electrical activity. Doctors most often use these monitors to diagnose arrhythmias. Arrhythmias are problems with the speed or rhythm of the heartbeat. The monitor is a small, portable device. You can wear one while you do your normal daily activities. This is usually used to diagnose what is causing palpitations/syncope (passing out). You will wear this for 48 hours.  Follow-Up: At [facility], you and your health needs are our priority.  As part of our continuing mission to provide you with exceptional heart care, we have created designated Provider Care Teams.  These Care Teams include your primary  Cardiologist (physician) and Advanced Practice Providers (APPs -  Physician Assistants and Nurse Practitioners) who all work together to provide you with the care you need, when you need it. You will need a follow up appointment in 1 months.   You may see Jenne Campus or another member of our [employer] Provider Team in [place]: Shirlee More, MD . Jyl Heinz, MD

## 2018-04-26 NOTE — Progress Notes (Signed)
Cardiology Consultation:    Date:  04/26/2018   ID:  Brian [name], DOB 1975/04/09, MRN [id number]  PCP:  Sharyne Richters, MD  Cardiologist:  Jenne Campus, MD   Referring MD: Sharyne Richters, MD   Chief Complaint  Patient presents with  . PVC    Normal EKG, wants to make sure everything is okay to proceed with cancer treatments  I got PVCs  History of Present Illness:    Brian [name] is a 43 y.o. male who is being seen today for the evaluation of PVCs.  At the request of Sharyne Richters, MD.  He is a nurse working in the emergency room.  Recently he was diagnosed with some lymph node enlargement biopsy was done he was identified to have adenocarcinoma he did have a PET scan done which showed some activity within his neck as well as liver.  Also some activity in the esophagus.  He was scheduled to have endoscopy during the preparation to endoscopy was identified to have frequent PVCs and procedure was almost canceled however eventually it was done and he did well.  Biopsy was done we waiting for the results of biopsy of his lymph nodes as well as biopsy taken by gastroenterologist.  He described to have some palpitations sometimes does not not related to exercise there is no dizziness no passing out.  He does not exercise on a regular basis but lately he change his diet quite dramatically he started eating more raw food fruits and vegetables and fish rather than meat.  Top of that when he woke up after anesthesia after gastroscopy he felt some pressure in the chest.  Also described to have from time to time pressure in the chest that is not related to exercise.  Can last for few minutes there are no relieving or aggravating factor.  Never had any heart trouble he does not have hypertension no diabetes he is close is fine never smoked.  Past Medical History:  Diagnosis Date  . Kidney stone   . Rickettsia infection     Past Surgical History:  Procedure Laterality Date  . BIOPSY  04/05/2018   Procedure: BIOPSY;  Surgeon: Ronald Lobo, MD;  Location: [facility];  Service: Endoscopy;;  . CHOLECYSTECTOMY    . ESOPHAGOGASTRODUODENOSCOPY (EGD) WITH PROPOFOL N/A 04/05/2018   Procedure: ESOPHAGOGASTRODUODENOSCOPY (EGD) WITH PROPOFOL;  Surgeon: Ronald Lobo, MD;  Location: [facility];  Service: Endoscopy;  Laterality: N/A;    Current Medications: Current Meds  Medication Sig  . EPINEPHrine (EPIPEN 2-PAK) 0.3 mg/0.3 mL IJ SOAJ injection USE AS DIRECTED FOR LIFE THREATENING ALLERGIC REACTIONS (Patient taking differently: Inject 0.3 mg into the muscle as needed for anaphylaxis. )  . levothyroxine (SYNTHROID, LEVOTHROID) 25 MCG tablet Take 25 mcg by mouth daily before breakfast.  . OVER THE COUNTER MEDICATION Take 1 Scoop by mouth daily. Barley Green Powder  . oxyCODONE-acetaminophen (PERCOCET) 7.5-325 MG tablet Take 1 tablet by mouth at bedtime.  . sulfamethoxazole-trimethoprim (BACTRIM DS,SEPTRA DS) 800-160 MG tablet Take 1 tablet by mouth 2 (two) times daily.     Allergies:   Ibuprofen; Pantoprazole; Tramadol; Gadobutrol; Gadolinium derivatives; Doxycycline; and Guaifenesin & derivatives   Social History   Socioeconomic History  . Marital status: Married    Spouse name: Not on file  . Number of children: Not on file  . Years of education: Not on file  . Highest education level: Not on file  Occupational History  . Not on file  Social Needs  .  Financial resource strain: Not on file  . Food insecurity:    Worry: Not on file    Inability: Not on file  . Transportation needs:    Medical: Not on file    Non-medical: Not on file  Tobacco Use  . Smoking status: Never Smoker  . Smokeless tobacco: Never Used  Substance and Sexual Activity  . Alcohol use: No  . Drug use: No  . Sexual activity: Not on file  Lifestyle  . Physical activity:    Days per week: Not on file    Minutes per session: Not on file  . Stress: Not on file  Relationships  . Social connections:     Talks on phone: Not on file    Gets together: Not on file    Attends religious service: Not on file    Active member of club or organization: Not on file    Attends meetings of clubs or organizations: Not on file    Relationship status: Not on file  Other Topics Concern  . Not on file  Social History Narrative  . Not on file     Family History: The patient's family history includes Allergic rhinitis in his father; Diabetes in his mother; Heart attack in his mother; Heart disease in his mother; Heart failure in his mother; Hyperlipidemia in his father and mother; Hypertension in his mother; Rashes / Skin problems in his father. There is no history of Sudden death or Thyroid disease. ROS:   Please see the history of present illness.    All 14 point review of systems negative except as described per history of present illness.  EKGs/Labs/Other Studies Reviewed:    The following studies were reviewed today: View EKG done at the ENT office which showed normal sinus rhythm normal P interval normal QS complex duration morphology no ST-T segment changes   Recent Labs: 05/16/2017: B Natriuretic Peptide 33.7 05/25/2017: TSH 1.04 03/23/2018: ALT 26; BUN 14; Creatinine 0.89; Hemoglobin 14.3; Platelet Count 198; Potassium 4.0; Sodium 136  Recent Lipid Panel    Component Value Date/Time   CHOL  10/08/2008 0655    119        ATP III CLASSIFICATION:  <200     mg/dL   Desirable  200-239  mg/dL   Borderline High  >=240    mg/dL   High          TRIG 84 10/08/2008 0655   HDL 20 (L) 10/08/2008 0655   CHOLHDL 6.0 10/08/2008 0655   VLDL 17 10/08/2008 0655   LDLCALC  10/08/2008 0655    82        Total Cholesterol/HDL:CHD Risk Coronary Heart Disease Risk Table                     Men   Women  1/2 Average Risk   3.4   3.3  Average Risk       5.0   4.4  2 X Average Risk   9.6   7.1  3 X Average Risk  23.4   11.0        Use the calculated Patient Ratio above and the CHD Risk Table to determine  the patient's CHD Risk.        ATP III CLASSIFICATION (LDL):  <100     mg/dL   Optimal  100-129  mg/dL   Near or Above  Optimal  130-159  mg/dL   Borderline  160-189  mg/dL   High  >190     mg/dL   Very High    Physical Exam:    VS:  BP 112/70   Pulse (!) 55   Ht 5\' 10"  (1.778 m)   Wt 276 lb 1.9 oz (125.2 kg)   SpO2 97%   BMI 39.62 kg/m     Wt Readings from Last 3 Encounters:  04/26/18 276 lb 1.9 oz (125.2 kg)  04/05/18 282 lb (127.9 kg)  03/23/18 296 lb 12.8 oz (134.6 kg)     GEN:  Well nourished, well developed in no acute distress HEENT: Normal NECK: No JVD; No carotid bruits LYMPHATICS: No lymphadenopathy CARDIAC: RRR, no murmurs, no rubs, no gallops RESPIRATORY:  Clear to auscultation without rales, wheezing or rhonchi  ABDOMEN: Soft, non-tender, non-distended MUSCULOSKELETAL:  No edema; No deformity  SKIN: Warm and dry NEUROLOGIC:  Alert and oriented x 3 PSYCHIATRIC:  Normal affect   ASSESSMENT:    1. Right thyroid nodule   2. Extrasystole   3. Dyspnea on exertion   4. Atypical chest pain    PLAN:    In order of problems listed above:  1. Extrasystole.  Obviously concerning I will ask him to wear monitor for 48 hours to see exactly what can of palpitations with dealing with also when I see if there is any some sustained arrhythmia.  As a part of evaluation I will ask him to have an echocardiogram which allowed me to look at the left ventricular ejection fraction. 2. Dyspnea on exertion echocardiogram will be done to assess left ventricular ejection fraction as well as a stress test to rule out any potential ischemia 3. Atypical chest pain again stress test will be done to rule it out. 4. Adenocarcinoma discovered on lymph node biopsy followed by oncology team.  See him back in my office in about a month after test will be done   Medication Adjustments/Labs and Tests Ordered: Current medicines are reviewed at length with the patient  today.  Concerns regarding medicines are outlined above.  No orders of the defined types were placed in this encounter.  No orders of the defined types were placed in this encounter.   Signed, Park Liter, MD, [facility] 04/26/2018 11:25 AM    [facility]

## 2018-04-28 ENCOUNTER — Other Ambulatory Visit: Payer: Self-pay

## 2018-04-28 ENCOUNTER — Ambulatory Visit (INDEPENDENT_AMBULATORY_CARE_PROVIDER_SITE_OTHER): Payer: 59

## 2018-04-28 DIAGNOSIS — R0600 Dyspnea, unspecified: Secondary | ICD-10-CM

## 2018-04-28 DIAGNOSIS — R0609 Other forms of dyspnea: Secondary | ICD-10-CM

## 2018-04-28 DIAGNOSIS — I4949 Other premature depolarization: Secondary | ICD-10-CM

## 2018-04-29 DIAGNOSIS — C801 Malignant (primary) neoplasm, unspecified: Secondary | ICD-10-CM | POA: Diagnosis not present

## 2018-05-03 ENCOUNTER — Other Ambulatory Visit: Payer: Self-pay | Admitting: Family

## 2018-05-04 ENCOUNTER — Encounter: Payer: Self-pay | Admitting: Family

## 2018-05-04 ENCOUNTER — Other Ambulatory Visit: Payer: Self-pay | Admitting: Family

## 2018-05-04 ENCOUNTER — Telehealth: Payer: Self-pay | Admitting: Cardiology

## 2018-05-04 DIAGNOSIS — C73 Malignant neoplasm of thyroid gland: Secondary | ICD-10-CM

## 2018-05-04 HISTORY — DX: Malignant neoplasm of thyroid gland: C73

## 2018-05-04 NOTE — Telephone Encounter (Signed)
Patient called back for results of monitor. Call his work number

## 2018-05-04 NOTE — Telephone Encounter (Signed)
Patient informed of monitor results.

## 2018-05-05 ENCOUNTER — Encounter: Payer: Self-pay | Admitting: *Deleted

## 2018-05-05 NOTE — Progress Notes (Signed)
Patient c/o pain in his right flank and back. Also has some increased pain in his right neck. He isn't taking anything for pain as he is continuing to work at this time.  Patient denies any urinary symptoms. No fever.   Spoke with Dr Maylon Peppers. He would like patient to rest physically as much as possible and treat pain with tylenol. If after the weekend the pain doesn't improve, he is to call back and speak with Dr Marin Olp about any further workup, or pain management.   Patient understands instructions. He also is notifying the office that his thyroidectomy is being delayed due to restrictions around the COVID 19. His surgery likely won't get scheduled until 2-3 weeks from now. Dr Marin Olp sent an inbasket to make him aware of delay.

## 2018-05-06 ENCOUNTER — Ambulatory Visit (HOSPITAL_BASED_OUTPATIENT_CLINIC_OR_DEPARTMENT_OTHER)
Admission: RE | Admit: 2018-05-06 | Discharge: 2018-05-06 | Disposition: A | Discharge status: Home / Self Care | Payer: 59 | Source: Ambulatory Visit | Attending: Cardiology | Admitting: Cardiology

## 2018-05-06 ENCOUNTER — Other Ambulatory Visit: Payer: Self-pay

## 2018-05-06 DIAGNOSIS — R0600 Dyspnea, unspecified: Secondary | ICD-10-CM

## 2018-05-06 DIAGNOSIS — R0789 Other chest pain: Secondary | ICD-10-CM | POA: Diagnosis not present

## 2018-05-06 DIAGNOSIS — I4949 Other premature depolarization: Secondary | ICD-10-CM | POA: Diagnosis not present

## 2018-05-06 DIAGNOSIS — R0609 Other forms of dyspnea: Secondary | ICD-10-CM | POA: Insufficient documentation

## 2018-05-06 DIAGNOSIS — E041 Nontoxic single thyroid nodule: Secondary | ICD-10-CM | POA: Insufficient documentation

## 2018-05-06 MED ORDER — PERFLUTREN LIPID MICROSPHERE
1.0000 mL | INTRAVENOUS | Status: AC | PRN
  Administered 2018-05-06: 4 mL via INTRAVENOUS
  Filled 2018-05-06: qty 10

## 2018-05-06 NOTE — Progress Notes (Signed)
  Echocardiogram 2D Echocardiogram has been performed.  Brandolyn Shortridge T Azim Gillingham 05/06/2018, 2:18 PM

## 2018-05-12 ENCOUNTER — Other Ambulatory Visit: Payer: Self-pay | Admitting: Family

## 2018-05-16 ENCOUNTER — Encounter: Payer: Self-pay | Admitting: *Deleted

## 2018-05-16 NOTE — Progress Notes (Signed)
Followed up with patient regarding his surgery.  Patient is scheduled for right neck dissection with left thyroidectomy tomorrow. He will have a minimum one night inpatient stay and then should be discharged to home.  Message sent to Dr Marin Olp updating him of surgery date and request for follow up appointment guidance.

## 2018-05-17 ENCOUNTER — Telehealth: Payer: Self-pay | Admitting: Emergency Medicine

## 2018-05-17 ENCOUNTER — Ambulatory Visit (HOSPITAL_BASED_OUTPATIENT_CLINIC_OR_DEPARTMENT_OTHER): Payer: 59

## 2018-05-17 ENCOUNTER — Telehealth: Payer: Self-pay | Admitting: Hematology & Oncology

## 2018-05-17 DIAGNOSIS — Z9889 Other specified postprocedural states: Secondary | ICD-10-CM | POA: Diagnosis not present

## 2018-05-17 DIAGNOSIS — J45909 Unspecified asthma, uncomplicated: Secondary | ICD-10-CM | POA: Diagnosis not present

## 2018-05-17 DIAGNOSIS — C770 Secondary and unspecified malignant neoplasm of lymph nodes of head, face and neck: Secondary | ICD-10-CM | POA: Diagnosis not present

## 2018-05-17 DIAGNOSIS — Z79899 Other long term (current) drug therapy: Secondary | ICD-10-CM | POA: Diagnosis not present

## 2018-05-17 DIAGNOSIS — Z881 Allergy status to other antibiotic agents status: Secondary | ICD-10-CM | POA: Diagnosis not present

## 2018-05-17 DIAGNOSIS — C801 Malignant (primary) neoplasm, unspecified: Secondary | ICD-10-CM | POA: Diagnosis not present

## 2018-05-17 DIAGNOSIS — Z888 Allergy status to other drugs, medicaments and biological substances status: Secondary | ICD-10-CM | POA: Diagnosis not present

## 2018-05-17 NOTE — Telephone Encounter (Signed)
Appointments scheduled patient notified per 3/30 sch msg

## 2018-05-17 NOTE — Telephone Encounter (Signed)
Left message for patient to return call regarding switching upcoming appointment to televisit.

## 2018-05-18 ENCOUNTER — Telehealth: Payer: Self-pay | Admitting: Emergency Medicine

## 2018-05-18 NOTE — Telephone Encounter (Signed)
   Primary Cardiologist:  No primary care provider on file.   Patient contacted.  History reviewed.  No symptoms to suggest any unstable cardiac conditions.  Based on discussion, with current pandemic situation, we will be postponing this appointment for Brian [name] with a plan for f/u in 6-12 wks or sooner if feasible/necessary.  If symptoms change, he has been instructed to contact our office.   Routing to C19 CANCEL pool for tracking (P CV DIV CV19 CANCEL - reason for visit "other.") and assigning priority (1 = 4-6 wks, 2 = 6-12 wks, 3 = >12 wks).   Ashok Norris, RN  05/18/2018 11:40 AM         .

## 2018-05-20 ENCOUNTER — Ambulatory Visit: Payer: 59 | Admitting: Cardiology

## 2018-05-31 ENCOUNTER — Telehealth: Payer: Self-pay | Admitting: Cardiology

## 2018-05-31 NOTE — Telephone Encounter (Signed)
Left message for patient to return call to be rescheduled for a televisit.

## 2018-05-31 NOTE — Telephone Encounter (Signed)
Nevin Bloodgood confirmed this appt with patient.

## 2018-05-31 NOTE — Telephone Encounter (Signed)
Cardiac Questionnaire:    Since your last visit or hospitalization:    1. Have you been having new or worsening chest pain? no   2. Have you been having new or worsening shortness of breath? no 3. Have you been having new or worsening leg swelling, wt gain, or increase in abdominal girth (pants fitting more tightly)? no   4. Have you had any passing out spells? no    *A YES to any of these questions would result in the appointment being kept. *If all the answers to these questions are NO, we should indicate that given the current situation regarding the worldwide coronarvirus pandemic, at the recommendation of the CDC, we are looking to limit gatherings in our waiting area, and thus will reschedule their appointment beyond four weeks from today.   _____________   [id number] Pre-Screening Questions:   Do you currently have a fever? no  Have you recently travelled on a cruise, internationally, or to [place], [place], [place], [place], or [place], [place]) ? no  Have you been in contact with someone that is currently pending confirmation of Covid19 testing or has been confirmed to have the [place] virus?  no  Are you currently experiencing fatigue or cough? No YOUR CARDIOLOGY TEAM HAS ARRANGED FOR AN E-VISIT FOR YOUR APPOINTMENT - PLEASE REVIEW IMPORTANT INFORMATION BELOW SEVERAL DAYS PRIOR TO YOUR APPOINTMENT  Due to the recent COVID-19 pandemic, we are transitioning in-person office visits to tele-medicine visits in an effort to decrease unnecessary exposure to our patients, their families, and staff. Medicare and most insurances are covering these visits without a copay needed. We also encourage you to sign up for MyChart if you have not already done so. You will need a smartphone if possible. For patients that do not have this, we can still complete the visit using a regular telephone but do prefer a smartphone to enable video when possible. You may have a family member that lives with you  that can help. If possible, we also ask that you have a blood pressure cuff and scale at home to measure your blood pressure, heart rate and weight prior to your scheduled appointment. Patients with clinical needs that need an in-person evaluation and testing will still be able to come to the office if absolutely necessary. If you have any questions, feel free to call our office.     THE DAY OF YOUR APPOINTMENT  Approximately 15 minutes prior to your scheduled appointment, you will receive a telephone call from one of [facility] team - your caller ID may say "Unknown caller."  Our staff will confirm medications, vital signs for the day and any symptoms you may be experiencing. Please have this information available prior to the time of visit start. It may also be helpful for you to have a pad of paper and pen handy for any instructions given during your visit. They will also walk you through joining the smartphone meeting if this is a video visit.    CONSENT FOR TELE-HEALTH VISIT - PLEASE REVIEW  I hereby voluntarily request, consent and authorize [facility] and its employed or contracted physicians, physician assistants, nurse practitioners or other licensed health care professionals (the Practitioner), to provide me with telemedicine health care services (the Services") as deemed necessary by the treating Practitioner. I acknowledge and consent to receive the Services by the Practitioner via telemedicine. I understand that the telemedicine visit will involve communicating with the Practitioner through live audiovisual communication technology and the  disclosure of certain medical information by electronic transmission. I acknowledge that I have been given the opportunity to request an in-person assessment or other available alternative prior to the telemedicine visit and am voluntarily participating in the telemedicine visit.  I understand that I have the right to withhold or withdraw my consent  to the use of telemedicine in the course of my care at any time, without affecting my right to future care or treatment, and that the Practitioner or I may terminate the telemedicine visit at any time. I understand that I have the right to inspect all information obtained and/or recorded in the course of the telemedicine visit and may receive copies of available information for a reasonable fee.  I understand that some of the potential risks of receiving the Services via telemedicine include:   Delay or interruption in medical evaluation due to technological equipment failure or disruption;  Information transmitted may not be sufficient (e.g. poor resolution of images) to allow for appropriate medical decision making by the Practitioner; and/or   In rare instances, security protocols could fail, causing a breach of personal health information.  Furthermore, I acknowledge that it is my responsibility to provide information about my medical history, conditions and care that is complete and accurate to the best of my ability. I acknowledge that Practitioner's advice, recommendations, and/or decision may be based on factors not within their control, such as incomplete or inaccurate data provided by me or distortions of diagnostic images or specimens that may result from electronic transmissions. I understand that the practice of medicine is not an exact science and that Practitioner makes no warranties or guarantees regarding treatment outcomes. I acknowledge that I will receive a copy of this consent concurrently upon execution via email to the email address I last provided but may also request a printed copy by calling the office of [facility].    I understand that my insurance will be billed for this visit.   I have read or had this consent read to me.  I understand the contents of this consent, which adequately explains the benefits and risks of the Services being provided via telemedicine.   I  have been provided ample opportunity to ask questions regarding this consent and the Services and have had my questions answered to my satisfaction.  I give my informed consent for the services to be provided through the use of telemedicine in my medical care  By participating in this telemedicine visit I agree to the above.   Patient gives verbal consent for [facility] visit pp

## 2018-06-02 ENCOUNTER — Other Ambulatory Visit: Payer: Self-pay

## 2018-06-02 ENCOUNTER — Encounter: Payer: Self-pay | Admitting: Cardiology

## 2018-06-02 ENCOUNTER — Telehealth (INDEPENDENT_AMBULATORY_CARE_PROVIDER_SITE_OTHER): Payer: 59 | Admitting: Cardiology

## 2018-06-02 VITALS — HR 88 | Wt 267.0 lb

## 2018-06-02 DIAGNOSIS — R0600 Dyspnea, unspecified: Secondary | ICD-10-CM

## 2018-06-02 DIAGNOSIS — I4949 Other premature depolarization: Secondary | ICD-10-CM

## 2018-06-02 DIAGNOSIS — R0789 Other chest pain: Secondary | ICD-10-CM

## 2018-06-02 DIAGNOSIS — R0609 Other forms of dyspnea: Secondary | ICD-10-CM

## 2018-06-02 DIAGNOSIS — C73 Malignant neoplasm of thyroid gland: Secondary | ICD-10-CM

## 2018-06-02 DIAGNOSIS — K219 Gastro-esophageal reflux disease without esophagitis: Secondary | ICD-10-CM

## 2018-06-02 NOTE — Patient Instructions (Signed)
Medication Instructions:  Your physician recommends that you continue on your current medications as directed. Please refer to the Current Medication list given to you today.  If you need a refill on your cardiac medications before your next appointment, please call your pharmacy.   Lab work: None.  If you have labs (blood work) drawn today and your tests are completely normal, you will receive your results only by: . MyChart Message (if you have MyChart) OR . A paper copy in the mail If you have any lab test that is abnormal or we need to change your treatment, we will call you to review the results.  Testing/Procedures: None.   Follow-Up: At [facility], you and your health needs are our priority.  As part of our continuing mission to provide you with exceptional heart care, we have created designated Provider Care Teams.  These Care Teams include your primary Cardiologist (physician) and Advanced Practice Providers (APPs -  Physician Assistants and Nurse Practitioners) who all work together to provide you with the care you need, when you need it. You will need a follow up appointment in 3 months.  Please call our office 2 months in advance to schedule this appointment.  You may see No primary care provider on file. or another member of our [facility] Provider Team in [place]: Brian Munley, MD . Rajan Revankar, MD  Any Other Special Instructions Will Be Listed Below (If Applicable).     

## 2018-06-02 NOTE — Progress Notes (Signed)
Virtual Visit via Video Note   This visit type was conducted due to national recommendations for restrictions regarding the COVID-19 Pandemic (e.g. social distancing) in an effort to limit this patient's exposure and mitigate transmission in our community.  Due to his co-morbid illnesses, this patient is at least at moderate risk for complications without adequate follow up.  This format is felt to be most appropriate for this patient at this time.  All issues noted in this document were discussed and addressed.  A limited physical exam was performed with this format.  Please refer to the patient's chart for his consent to telehealth for [facility].  Evaluation Performed:  Follow-up visit  This visit type was conducted due to national recommendations for restrictions regarding the COVID-19 Pandemic (e.g. social distancing).  This format is felt to be most appropriate for this patient at this time.  All issues noted in this document were discussed and addressed.  No physical exam was performed (except for noted visual exam findings with Video Visits).  Please refer to the patient's chart (MyChart message for video visits and phone note for telephone visits) for the patient's consent to telehealth for [facility].  Date:  06/02/2018  ID: Brian [name], DOB 02/04/1976, MRN [id number]   Patient Location:  [place] [address]   Provider location:   [place] Office  PCP:  Brian Richters, MD  Cardiologist:  Brian Campus, MD     Chief Complaint: Doing well  History of Present Illness:    Brian [name] is a 42 y.o. male  who presents via audio/video conferencing for a telehealth visit today.  Who was referred to me because of palpitations apparently she was diagnosed to have some lymph node on the right side of his neck surgery was done he got frequent extrasystole after surgery as well as some chest tightness.  He described to have some skipped beats but  otherwise no symptoms recently he ended up having quite radical surgery on his neck that involve removal of complete thyroid as well as some additional surgery he does have significant scar on the right side of his neck.  Surgery was uneventful and he went through surgery with no problems there was no cardiac complications of it.  He said he is feeling better denies having a palpitations he does not feel his heart flip flopping or skipping a beat.  He still get some exertional shortness of breath as usual however he change quite drastically his diet and lost about 30 pounds.  Since that time he is feeling better.  Denies having any chest pain.  In the matter-of-fact yesterday he went shopping he was walking a lot and he has no difficulty doing it.   The patient does not have symptoms concerning for COVID-19 infection (fever, chills, cough, or new SHORTNESS OF BREATH).    Prior CV studies:   The following studies were reviewed today:  Holter monitor done on 28 April 2018 showed: Conclusion:  1 infrequent PVCs, infrequent PACs, no sustained arrhythmias  Interpreting  cardiologist: Brian Campus, MD  Date: 05/03/2018 4:34 PM   Echocardiogram done on 06 May 2018 showed:   1. The left ventricle has normal systolic function with an ejection fraction of 60-65%. The cavity size was normal. There is mild concentric left ventricular hypertrophy. Left ventricular diastolic parameters were normal.  2. The right ventricle has normal systolic function. The cavity was mildly enlarged. There is no increase  in right ventricular wall thickness.  3. The aortic root, ascending aorta and aortic arch are normal in size and structure.   Past Medical History:  Diagnosis Date  . Kidney stone   . 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 Rickettsia infection     Past Surgical History:  Procedure Laterality Date  . BIOPSY  04/05/2018   Procedure: BIOPSY;  Surgeon:  Ronald Lobo, MD;  Location: [facility];  Service: Endoscopy;;  . CHOLECYSTECTOMY    . ESOPHAGOGASTRODUODENOSCOPY (EGD) WITH PROPOFOL N/A 04/05/2018   Procedure: ESOPHAGOGASTRODUODENOSCOPY (EGD) WITH PROPOFOL;  Surgeon: Ronald Lobo, MD;  Location: [facility]OSCOPY;  Service: Endoscopy;  Laterality: N/A;     Current Meds  Medication Sig  . EPINEPHrine (EPIPEN 2-PAK) 0.3 mg/0.3 mL IJ SOAJ injection USE AS DIRECTED FOR LIFE THREATENING ALLERGIC REACTIONS (Patient taking differently: Inject 0.3 mg into the muscle as needed for anaphylaxis. )  . levothyroxine (SYNTHROID, LEVOTHROID) 125 MCG tablet Take 125 mcg by mouth daily before breakfast.   . OVER THE COUNTER MEDICATION Take 1 Scoop by mouth daily. Barley Green Powder      Family History: The patient's family history includes Allergic rhinitis in his father; Diabetes in his mother; Heart attack in his mother; Heart disease in his mother; Heart failure in his mother; Hyperlipidemia in his father and mother; Hypertension in his mother; Rashes / Skin problems in his father. There is no history of Sudden death or Thyroid disease.   ROS:   Please see the history of present illness.     All other systems reviewed and are negative.   Labs/Other Tests and Data Reviewed:     Recent Labs: 03/23/2018: ALT 26; BUN 14; Creatinine 0.89; Hemoglobin 14.3; Platelet Count 198; Potassium 4.0; Sodium 136  Recent Lipid Panel    Component Value Date/Time   CHOL  10/08/2008 0655    119        ATP III CLASSIFICATION:  <200     mg/dL   Desirable  200-239  mg/dL   Borderline High  >=240    mg/dL   High          TRIG 84 10/08/2008 0655   HDL 20 (L) 10/08/2008 0655   CHOLHDL 6.0 10/08/2008 0655   VLDL 17 10/08/2008 0655   LDLCALC  10/08/2008 0655    82        Total Cholesterol/HDL:CHD Risk Coronary Heart Disease Risk Table                     Men   Women  1/2 Average Risk   3.4   3.3  Average Risk       5.0   4.4  2 X Average Risk   9.6   7.1   3 X Average Risk  23.4   11.0        Use the calculated Patient Ratio above and the CHD Risk Table to determine the patient's CHD Risk.        ATP III CLASSIFICATION (LDL):  <100     mg/dL   Optimal  100-129  mg/dL   Near or Above                    Optimal  130-159  mg/dL   Borderline  160-189  mg/dL   High  >190     mg/dL   Very High      Exam:    Vital  Signs:  Pulse 88   Wt 267 lb (121.1 kg)   BMI 38.31 kg/m     Wt Readings from Last 3 Encounters:  06/02/18 267 lb (121.1 kg)  04/26/18 276 lb 1.9 oz (125.2 kg)  04/05/18 282 lb (127.9 kg)     Well nourished, well developed male in no acute distress. Conversation.  Not in any distress he show me his wound on the neck it is healing well there is no JVD there is no swelling of lower extremities.  Diagnosis for this visit:   1. Extrasystole   2. Gastroesophageal reflux disease without esophagitis   3. 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 Dyspnea on exertion   5. Atypical chest pain      ASSESSMENT & PLAN:    1.  Extrasystole.  Holter monitor showed only infrequent PVCs which are not worrisome.  I did also echocardiogram which was perfectly normal were also talking about doing stress test but it did not happen.  He is doing well he went to surgery with no difficulties he denies having any palpitations.  I suspect that part of the reason for his palpitations were problem with his thyroid.  I also warned him about the fact that he may have some more palpitations I told him to let me know if this is how it happens. 2.  Gastroesophageal reflux disease.  Stable. 3.  Thyroid cancer status post radical surgery.  He scheduled to see his oncologist to talk about options for this situation which probably will include radiation therapy or maybe even radioactive iodine. 4.  Dyspnea on exertion much better right now he lost some weight he change his diet he is doing better.  Still recovering after  surgery. 5.  Atypical chest pain.  Denies having any.  COVID-19 Education: The signs and symptoms of COVID-19 were discussed with the patient and how to seek care for testing (follow up with PCP or arrange E-visit).  The importance of social distancing was discussed today.  Patient Risk:   After full review of this patients clinical status, I feel that they are at least moderate risk at this time.  Time:   Today, I have spent 16 minutes with the patient with telehealth technology discussing pt health issues. Visit was finished at 1:29 PM.    Medication Adjustments/Labs and Tests Ordered: Current medicines are reviewed at length with the patient today.  Concerns regarding medicines are outlined above.  No orders of the defined types were placed in this encounter.  Medication changes: No orders of the defined types were placed in this encounter.    Disposition: Follow-up 3 months  Signed, Park Liter, MD, [facility] 06/02/2018 1:30 PM    [facility]

## 2018-06-08 ENCOUNTER — Other Ambulatory Visit: Payer: Self-pay | Admitting: Hematology & Oncology

## 2018-06-08 ENCOUNTER — Encounter: Payer: Self-pay | Admitting: *Deleted

## 2018-06-08 DIAGNOSIS — E890 Postprocedural hypothyroidism: Secondary | ICD-10-CM | POA: Diagnosis not present

## 2018-06-08 DIAGNOSIS — C73 Malignant neoplasm of thyroid gland: Secondary | ICD-10-CM | POA: Diagnosis not present

## 2018-06-08 NOTE — Progress Notes (Signed)
Request for [facility] sent on [facility] Specimen [id number]  Per Dr Antonieta Pert request

## 2018-06-09 ENCOUNTER — Encounter: Payer: Self-pay | Admitting: Hematology & Oncology

## 2018-06-09 ENCOUNTER — Other Ambulatory Visit: Payer: Self-pay

## 2018-06-09 ENCOUNTER — Inpatient Hospital Stay: Payer: 59 | Attending: Hematology & Oncology

## 2018-06-09 ENCOUNTER — Inpatient Hospital Stay (HOSPITAL_BASED_OUTPATIENT_CLINIC_OR_DEPARTMENT_OTHER): Payer: 59 | Admitting: Hematology & Oncology

## 2018-06-09 ENCOUNTER — Other Ambulatory Visit: Payer: Self-pay | Admitting: *Deleted

## 2018-06-09 VITALS — BP 120/87 | HR 90 | Resp 20 | Wt 264.0 lb

## 2018-06-09 DIAGNOSIS — C73 Malignant neoplasm of thyroid gland: Secondary | ICD-10-CM | POA: Diagnosis not present

## 2018-06-09 DIAGNOSIS — C770 Secondary and unspecified malignant neoplasm of lymph nodes of head, face and neck: Secondary | ICD-10-CM | POA: Diagnosis not present

## 2018-06-09 DIAGNOSIS — C342 Malignant neoplasm of middle lobe, bronchus or lung: Secondary | ICD-10-CM

## 2018-06-09 LAB — CBC WITH DIFFERENTIAL (CANCER CENTER ONLY)
Abs Immature Granulocytes: 0 10*3/uL (ref 0.00–0.07)
Basophils Absolute: 0 10*3/uL (ref 0.0–0.1)
Basophils Relative: 1 %
Eosinophils Absolute: 0 10*3/uL (ref 0.0–0.5)
Eosinophils Relative: 1 %
HCT: 43.2 % (ref 39.0–52.0)
Hemoglobin: 14.7 g/dL (ref 13.0–17.0)
Immature Granulocytes: 0 %
Lymphocytes Relative: 33 %
Lymphs Abs: 1.4 10*3/uL (ref 0.7–4.0)
MCH: 29.8 pg (ref 26.0–34.0)
MCHC: 34 g/dL (ref 30.0–36.0)
MCV: 87.6 fL (ref 80.0–100.0)
Monocytes Absolute: 0.4 10*3/uL (ref 0.1–1.0)
Monocytes Relative: 9 %
Neutro Abs: 2.5 10*3/uL (ref 1.7–7.7)
Neutrophils Relative %: 56 %
Platelet Count: 210 10*3/uL (ref 150–400)
RBC: 4.93 MIL/uL (ref 4.22–5.81)
RDW: 13.2 % (ref 11.5–15.5)
WBC Count: 4.4 10*3/uL (ref 4.0–10.5)
nRBC: 0 % (ref 0.0–0.2)

## 2018-06-09 LAB — CMP (CANCER CENTER ONLY)
ALT: 28 U/L (ref 0–44)
AST: 23 U/L (ref 15–41)
Albumin: 4.7 g/dL (ref 3.5–5.0)
Alkaline Phosphatase: 88 U/L (ref 38–126)
Anion gap: 7 (ref 5–15)
BUN: 9 mg/dL (ref 6–20)
CO2: 29 mmol/L (ref 22–32)
Calcium: 10.1 mg/dL (ref 8.9–10.3)
Chloride: 103 mmol/L (ref 98–111)
Creatinine: 0.86 mg/dL (ref 0.61–1.24)
GFR, Est AFR Am: >60 mL/min (ref >60)
GFR, Estimated: >60 mL/min (ref >60)
Glucose, Bld: 92 mg/dL (ref 70–99)
Potassium: 4.2 mmol/L (ref 3.5–5.1)
Sodium: 139 mmol/L (ref 135–145)
Total Bilirubin: 0.7 mg/dL (ref 0.3–1.2)
Total Protein: 6.5 g/dL (ref 6.5–8.1)

## 2018-06-09 MED ORDER — DIPHENHYDRAMINE HCL 50 MG PO TABS: ORAL_TABLET | ORAL | 0 refills | Status: DC

## 2018-06-09 MED ORDER — PREDNISONE 50 MG PO TABS: ORAL_TABLET | ORAL | 3 refills | Status: DC

## 2018-06-09 NOTE — Progress Notes (Signed)
Hematology and Oncology Follow Up Visit  Brian [name] [id number] [birth year] 43 y.o. 06/09/2018   Principle Diagnosis:   Metastatic follicular/papillary carcinoma of the thyroid  Current Therapy:    Status post right neck dissection     Interim History:  Brian [name] is back for follow-up.  We first saw him back in early February.  At that time, he had a lymph node biopsy which showed metastatic adeno carcinoma.  He had extensive studies done to try to find the primary.  He had a PET scan that was done.  This suggest that there was an area in the distal esophagus.  He underwent upper endoscopy which was negative for any tumor.  Biopsies were taken which were all negative.  He ultimately had [facility] evaluation.  This was done on 04/29/2018.  The molecular analysis showed that the tumor was 96% thyroid in origin.  Knowing this, he then underwent a radical right neck dissection and removal of his thyroid.  Surprisingly, the thyroid did not show any obvious malignancy.  He had 5/10 lymph nodes on the right side of the neck that contained a high-grade carcinoma.  The pathology report is [id number]).  The neurologist at [facility] sent the specimen up to [place] for a second opinion.  The pathologist at the [place] was not sure as to what the tumor was.  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  He was seen by Dr. Hartford Poli of endocrinology.  Dr. Hartford Poli will determine if this tumor is iodine avid.  If it is, then Brian [name] will undergo I-131 therapy.  Since it has been a while since he has been evaluated with radiologic studies, I thought they would be interesting to see what was going on with his liver.  I think this would be informative.  Brian [name] looks great.  He feels great.  He has had no issues with pain.  There is no bleeding.  He has had no nausea or vomiting.  He has had no change in bowel or bladder habits.  Overall, his  performance status is ECOG 0.  Medications:  Current Outpatient Medications:  .  EPINEPHrine (EPIPEN 2-PAK) 0.3 mg/0.3 mL IJ SOAJ injection, USE AS DIRECTED FOR LIFE THREATENING ALLERGIC REACTIONS (Patient taking differently: Inject 0.3 mg into the muscle as needed for anaphylaxis. ), Disp: 4 Device, Rfl: 3 .  levothyroxine (SYNTHROID) 200 MCG tablet, Take by mouth., Disp: , Rfl:  .  mupirocin ointment (BACTROBAN) 2 %, APPLY TO AFFECTED AREA DAILY AS NEEDED, Disp: , Rfl:  .  OVER THE COUNTER MEDICATION, Take 1 Scoop by mouth daily. Barley Green Powder, Disp: , Rfl:  .  diphenhydrAMINE (BENADRYL) 50 MG tablet, Take one tablet one hour before scan, Disp: 30 tablet, Rfl: 0 .  predniSONE (DELTASONE) 50 MG tablet, Take one tablet 13h, 7h and 1h before scan., Disp: 3 tablet, Rfl: 3  Allergies:  Allergies  Allergen Reactions  . Ibuprofen Itching  . Pantoprazole Itching  . Tramadol Hives and Itching  . Gadobutrol Hives and Other (See Comments)    Patient had MRI scan at [place]. Patient called one hour after he left imaging facility to report two "blisters" that came up on "back" of lip.   . Gadolinium Derivatives Hives and Other (See Comments)    Patient had MRI scan at [place]. Patient called one hour after he left imaging facility to report two "blisters" that came up on "back" of  lip.   . Doxycycline Other (See Comments)    "heart arrythmia" and "dyspepsia"  . Guaifenesin & Derivatives Other (See Comments)    Reaction unknown    Past Medical History, Surgical history, Social history, and Family History were reviewed and updated.  Review of Systems: Review of Systems  Constitutional: Negative.   HENT:   Positive for lump/mass and sore throat.   Eyes: Negative.   Respiratory: Negative.   Cardiovascular: Negative.   Gastrointestinal: Negative.   Endocrine: Negative.   Genitourinary: Negative.    Musculoskeletal: Negative.   Skin: Negative.   Neurological:  Negative.   Hematological: Negative.   Psychiatric/Behavioral: Negative.     Physical Exam:  weight is 264 lb (119.7 kg). His blood pressure is 120/87 and his pulse is 90. His respiration is 20 and oxygen saturation is 98%.   Wt Readings from Last 3 Encounters:  06/09/18 264 lb (119.7 kg)  06/02/18 267 lb (121.1 kg)  04/26/18 276 lb 1.9 oz (125.2 kg)    Physical Exam Vitals signs reviewed.  HENT:     Head: Normocephalic and atraumatic.  Eyes:     Pupils: Pupils are equal, round, and reactive to light.  Neck:     Musculoskeletal: Normal range of motion.     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  This is well-healing.  There is some slight fullness but no distinct mass noted in the neck. Cardiovascular:     Rate and Rhythm: Normal rate and regular rhythm.     Heart sounds: Normal heart sounds.  Pulmonary:     Effort: Pulmonary effort is normal.     Breath sounds: Normal breath sounds.  Abdominal:     General: Bowel sounds are normal.     Palpations: Abdomen is soft.  Musculoskeletal: Normal range of motion.        General: No tenderness or deformity.  Lymphadenopathy:     Cervical: No cervical adenopathy.  Skin:    General: Skin is warm and dry.     Findings: No erythema or rash.  Neurological:     Mental Status: He is alert and oriented to person, place, and time.  Psychiatric:        Behavior: Behavior normal.        Thought Content: Thought content normal.        Judgment: Judgment normal.      Lab Results  Component Value Date   WBC 4.4 06/09/2018   HGB 14.7 06/09/2018   HCT 43.2 06/09/2018   MCV 87.6 06/09/2018   PLT 210 06/09/2018     Chemistry      Component Value Date/Time   NA 139 06/09/2018 1432   K 4.2 06/09/2018 1432   CL 103 06/09/2018 1432   CO2 29 06/09/2018 1432   BUN 9 06/09/2018 1432   CREATININE 0.86 06/09/2018 1432      Component Value Date/Time   CALCIUM 10.1 06/09/2018 1432    ALKPHOS 88 06/09/2018 1432   AST 23 06/09/2018 1432   ALT 28 06/09/2018 1432   BILITOT 0.7 06/09/2018 1432      Impression and Plan: Brian [name] is a 43 year old white male.  I must say, that this is clearly not a "run-of-the-mill" thyroid cancer.  It is still not conclusive as to whether or not this is iodine avid.  We will have to see what the iodine uptake studies are.  Again, I would think that  if this tumor does take up iodine, that I-131 would be the appropriate therapy for this.  If this tumor is not iodine avid, then I think we have to really think about systemic therapy from a different point of view.  I am trying to get the tumor that was removed sent off for molecular analysis.  I really think this is going to be necessary.  I really need to see what the molecular make-up is and see if there are any molecular defects that we can target.  I would also like to see if there is any indication for immunotherapy.  Hopefully, we are not looking at anaplastic thyroid cancer.  If so, then chemotherapy is typically the therapy of choice.  Again, we will get an MRI of his liver to see what changes have occurred in the tumor that was in his liver when we first saw him.  Again, this is a very complicated case.  I was with Brian [name] for about 40 minutes or so.  We will plan to get him back here depending on what is found with his other studies.   [name], MD 4/23/20205:21 PM

## 2018-06-10 LAB — TSH: TSH: 11.425 u[IU]/mL — ABNORMAL HIGH (ref 0.320–4.118)

## 2018-06-10 LAB — LACTATE DEHYDROGENASE: LDH: 167 U/L (ref 98–192)

## 2018-06-11 ENCOUNTER — Other Ambulatory Visit: Payer: Self-pay

## 2018-06-11 ENCOUNTER — Ambulatory Visit (HOSPITAL_COMMUNITY)
Admission: RE | Admit: 2018-06-11 | Discharge: 2018-06-11 | Disposition: A | Discharge status: Home / Self Care | Payer: 59 | Source: Ambulatory Visit | Attending: Hematology & Oncology | Admitting: Hematology & Oncology

## 2018-06-11 DIAGNOSIS — K769 Liver disease, unspecified: Secondary | ICD-10-CM | POA: Diagnosis not present

## 2018-06-11 DIAGNOSIS — C73 Malignant neoplasm of thyroid gland: Secondary | ICD-10-CM | POA: Insufficient documentation

## 2018-06-11 MED ORDER — GADOBUTROL 1 MMOL/ML IV SOLN
10.0000 mL | Freq: Once | INTRAVENOUS | Status: AC | PRN
  Administered 2018-06-11: 10 mL via INTRAVENOUS

## 2018-06-13 ENCOUNTER — Other Ambulatory Visit: Payer: Self-pay | Admitting: Hematology & Oncology

## 2018-06-13 DIAGNOSIS — C73 Malignant neoplasm of thyroid gland: Secondary | ICD-10-CM

## 2018-06-13 LAB — THYROGLOBULIN LEVEL: Thyroglobulin: 2.5 ng/mL

## 2018-06-14 ENCOUNTER — Other Ambulatory Visit: Payer: Self-pay | Admitting: Hematology & Oncology

## 2018-06-14 ENCOUNTER — Other Ambulatory Visit: Payer: Self-pay | Admitting: Student

## 2018-06-14 DIAGNOSIS — C73 Malignant neoplasm of thyroid gland: Secondary | ICD-10-CM

## 2018-06-15 ENCOUNTER — Other Ambulatory Visit: Payer: Self-pay

## 2018-06-15 ENCOUNTER — Ambulatory Visit (HOSPITAL_COMMUNITY)
Admission: RE | Admit: 2018-06-15 | Discharge: 2018-06-15 | Disposition: A | Discharge status: Home / Self Care | Payer: 59 | Source: Ambulatory Visit | Attending: Hematology & Oncology | Admitting: Hematology & Oncology

## 2018-06-15 ENCOUNTER — Encounter (HOSPITAL_COMMUNITY): Payer: Self-pay

## 2018-06-15 ENCOUNTER — Encounter: Payer: Self-pay | Admitting: *Deleted

## 2018-06-15 DIAGNOSIS — C73 Malignant neoplasm of thyroid gland: Secondary | ICD-10-CM | POA: Insufficient documentation

## 2018-06-15 DIAGNOSIS — K769 Liver disease, unspecified: Secondary | ICD-10-CM | POA: Insufficient documentation

## 2018-06-15 DIAGNOSIS — Z9089 Acquired absence of other organs: Secondary | ICD-10-CM | POA: Diagnosis not present

## 2018-06-15 DIAGNOSIS — C787 Secondary malignant neoplasm of liver and intrahepatic bile duct: Secondary | ICD-10-CM | POA: Diagnosis not present

## 2018-06-15 DIAGNOSIS — K7689 Other specified diseases of liver: Secondary | ICD-10-CM | POA: Diagnosis not present

## 2018-06-15 LAB — CBC
HCT: 45.4 % (ref 39.0–52.0)
Hemoglobin: 15.3 g/dL (ref 13.0–17.0)
MCH: 29.7 pg (ref 26.0–34.0)
MCHC: 33.7 g/dL (ref 30.0–36.0)
MCV: 88.2 fL (ref 80.0–100.0)
Platelets: 197 10*3/uL (ref 150–400)
RBC: 5.15 MIL/uL (ref 4.22–5.81)
RDW: 13.2 % (ref 11.5–15.5)
WBC: 4.7 10*3/uL (ref 4.0–10.5)
nRBC: 0 % (ref 0.0–0.2)

## 2018-06-15 LAB — APTT: aPTT: 27 seconds (ref 24–36)

## 2018-06-15 LAB — PROTIME-INR
INR: 1 (ref 0.8–1.2)
Prothrombin Time: 13.5 seconds (ref 11.4–15.2)

## 2018-06-15 MED ORDER — MIDAZOLAM HCL 2 MG/2ML IJ SOLN
INTRAMUSCULAR | Status: AC | PRN
  Administered 2018-06-15: 1 mg via INTRAVENOUS
  Administered 2018-06-15 (×2): 0.5 mg via INTRAVENOUS

## 2018-06-15 MED ORDER — FENTANYL CITRATE (PF) 100 MCG/2ML IJ SOLN
INTRAMUSCULAR | Status: AC | PRN
  Administered 2018-06-15: 50 ug via INTRAVENOUS
  Administered 2018-06-15 (×2): 25 ug via INTRAVENOUS

## 2018-06-15 MED ORDER — FENTANYL CITRATE (PF) 100 MCG/2ML IJ SOLN
INTRAMUSCULAR | Status: AC
  Filled 2018-06-15: qty 2

## 2018-06-15 MED ORDER — LIDOCAINE HCL (PF) 1 % IJ SOLN
INTRAMUSCULAR | Status: AC
  Filled 2018-06-15: qty 30

## 2018-06-15 MED ORDER — SODIUM CHLORIDE 0.9 % IV SOLN: INTRAVENOUS | Status: DC

## 2018-06-15 MED ORDER — MIDAZOLAM HCL 2 MG/2ML IJ SOLN
INTRAMUSCULAR | Status: AC
  Filled 2018-06-15: qty 2

## 2018-06-15 MED ORDER — ACETAMINOPHEN 500 MG PO TABS
500.0000 mg | ORAL_TABLET | ORAL | Status: DC | PRN
  Filled 2018-06-15: qty 1

## 2018-06-15 MED ORDER — GELATIN ABSORBABLE 12-7 MM EX MISC
CUTANEOUS | Status: AC
  Filled 2018-06-15: qty 1

## 2018-06-15 NOTE — Discharge Instructions (Signed)
Liver Biopsy, Care After °These instructions give you information about how to care for yourself after your procedure. Your health care provider may also give you more specific instructions. If you have problems or questions, contact your health care provider. °What can I expect after the procedure? °After your procedure, it is common to have: °· Pain and soreness in the area where the biopsy was done. °· Bruising around the area where the biopsy was done. °· Sleepiness and fatigue for 1-2 days. °Follow these instructions at home: °Medicines °· Take over-the-counter and prescription medicines only as told by your health care provider. °· If you were prescribed an antibiotic medicine, take it as told by your health care provider. Do not stop taking the antibiotic even if you start to feel better. °· Do not take medicines such as aspirin and ibuprofen unless your health care provider tells you to take them. These medicines thin your blood and can increase the risk of bleeding. °· If you are taking prescription pain medicine, take actions to prevent or treat constipation. Your health care provider may recommend that you: °? Drink enough fluid to keep your urine pale yellow. °? Eat foods that are high in fiber, such as fresh fruits and vegetables, whole grains, and beans. °? Limit foods that are high in fat and processed sugars, such as fried or sweet foods. °? Take an over-the-counter or prescription medicine for constipation. °Incision care °· Follow instructions from your health care provider about how to take care of your incision. Make sure you: °? Wash your hands with soap and water before you change your bandage (dressing). If soap and water are not available, use hand sanitizer. °? Change your dressing as told by your health care provider. °? Leave stitches (sutures), skin glue, or adhesive strips in place. These skin closures may need to stay in place for 2 weeks or longer. If adhesive strip edges start to  loosen and curl up, you may trim the loose edges. Do not remove adhesive strips completely unless your health care provider tells you to do that. °· Check your incision area every day for signs of infection. Check for: °? Redness, swelling, or pain. °? Fluid or blood. °? Warmth. °? Pus or a bad smell. °· Do not take baths, swim, or use a hot tub until your health care provider says it is okay to do so. °Activity ° °· Rest at home for 1-2 days, or as directed by your health care provider. °? Avoid sitting for a long time without moving. Get up to take short walks every 1-2 hours. This is important to improve blood flow and breathing. Ask for help if you feel weak or unsteady. °· Return to your normal activities as told by your health care provider. Ask your health care provider what activities are safe for you. °· Do not drive or use heavy machinery while taking prescription pain medicine. °· Do not lift anything that is heavier than 10 lb (4.5 kg), or the limit that your health care provider tells you, until he or she says that it is safe. °· Do not play contact sports for 2 weeks after the procedure. °General instructions ° °· Do not drink alcohol in the first week after the procedure. °· Have someone stay with you for at least 24 hours after the procedure. °· It is your responsibility to obtain your test results. Ask your health care provider, or the department that is doing the test: °? When will my   results be ready? °? How will I get my results? °? What are my treatment options? °? What other tests do I need? °? What are my next steps? °· Keep all follow-up visits as told by your health care provider. This is important. °Contact a health care provider if: °· You have increased bleeding from an incision, resulting in more than a small spot of blood. °· You have redness, swelling, or increasing pain in any incisions. °· You notice a discharge or a bad smell coming from any of your incisions. °· You have a fever or  chills. °Get help right away if: °· You develop swelling, bloating, or pain in your abdomen. °· You become dizzy or faint. °· You develop a rash. °· You have nausea or you vomit. °· You faint, or you have shortness of breath or difficulty breathing. °· You develop chest pain. °· You have problems with your speech or vision. °· You have trouble with your balance or moving your arms or legs. °Summary °· After the liver biopsy, it is common to have pain, soreness, and bruising in the area, as well as sleepiness and fatigue. °· Take over-the-counter and prescription medicines only as told by your health care provider. °· Follow instructions from your health care provider about how to care for your incision. Check the incision area daily for signs of infection. °This information is not intended to replace advice given to you by your health care provider. Make sure you discuss any questions you have with your health care provider. °Document Released: 08/22/2004 Document Revised: 02/12/2017 Document Reviewed: 02/12/2017 °Elsevier Interactive Patient Education © 2019 [employer] °Moderate Conscious Sedation, Adult, Care After °These instructions provide you with information about caring for yourself after your procedure. Your health care provider may also give you more specific instructions. Your treatment has been planned according to current medical practices, but problems sometimes occur. Call your health care provider if you have any problems or questions after your procedure. °What can I expect after the procedure? °After your procedure, it is common: °· To feel sleepy for several hours. °· To feel clumsy and have poor balance for several hours. °· To have poor judgment for several hours. °· To vomit if you eat too soon. °Follow these instructions at home: °For at least 24 hours after the procedure: ° °· Do not: °? Participate in activities where you could fall or become injured. °? Drive. °? Use heavy  machinery. °? Drink alcohol. °? Take sleeping pills or medicines that cause drowsiness. °? Make important decisions or sign legal documents. °? Take care of children on your own. °· Rest. °Eating and drinking °· Follow the diet recommended by your health care provider. °· If you vomit: °? Drink water, juice, or soup when you can drink without vomiting. °? Make sure you have little or no nausea before eating solid foods. °General instructions °· Have a responsible adult stay with you until you are awake and alert. °· Take over-the-counter and prescription medicines only as told by your health care provider. °· If you smoke, do not smoke without supervision. °· Keep all follow-up visits as told by your health care provider. This is important. °Contact a health care provider if: °· You keep feeling nauseous or you keep vomiting. °· You feel light-headed. °· You develop a rash. °· You have a fever. °Get help right away if: °· You have trouble breathing. °This information is not intended to replace advice given to you by your   health care provider. Make sure you discuss any questions you have with your health care provider. °Document Released: 11/23/2012 Document Revised: 07/08/2015 Document Reviewed: 05/25/2015 °Elsevier Interactive Patient Education © 2019 [employer] ° °

## 2018-06-15 NOTE — Procedures (Signed)
Interventional Radiology Procedure:   Indications: Liver lesion and thyroid cancer  Procedure: US guided liver lesion biopsy  Findings: Hypoechoic lesion. 4 cores obtained.  Gelfoam injected along biopsy tract after biopsy  Complications: None     EBL: Less than 10 ml  Plan: Bedrest 3 hours.    Janequa Kipnis R. Anselm Pancoast, MD  Pager: [phone]

## 2018-06-15 NOTE — Progress Notes (Signed)
Spoke to patient briefly today after his liver biopsy. He had questions about turn around time for pathology from today's biopsy, but also the paradigm testing done on previous biopsy.  Called [facility] at [phone] to follow up on paradigm request. They stated they sent the specimen to paradigm on Monday. Results will be about 7-10 days.   Patient is aware.

## 2018-06-15 NOTE — Progress Notes (Signed)
D/c instructions given to wife, Jonelle Sidle, via telephone due to visitor restrictions.  No questions at this time and Tiffany verbalized understanding of instructions

## 2018-06-15 NOTE — H&P (Signed)
Chief Complaint: Patient was seen in consultation today for liver lesion/biopsy.  Referring Physician(s): Ennever,Peter R  Supervising Physician: Markus Daft  Patient Status: [facility] - Out-pt  History of Present Illness: Brian [name] is a 43 y.o. male with a past medical history of nephrolithiasis, metastatic thyroid cancer, and rickettsia infection. He was unfortunately diagnosed with thyroid cancer in 03/2018. His cancer is managed by Dr. Marin Olp. He underwent a complete thyroidectomy 05/17/2018 by Dr. Dimas Millin at [facility] in [place], [place]. His most recent imaging follow-up revealed a liver lesion suspicious for metastatic disease.  MR liver 06/11/2018: 1. 1.7 x 2.1 cm lesion in segment 8 of the liver concerning for metastatic lesion. 2. Smaller lesion in the left lobe of the liver between segments 2 and 3 has imaging characteristics most compatible with a benign flash fill cavernous hemangioma.  IR requested by Dr. Marin Olp for possible image-guided liver lesion biopsy. Patient awake and alert sitting in bed with no complaints at this time. Denies fever, chills, chest pain, dyspnea, abdominal pain, or headache.   Past Medical History:  Diagnosis Date   Kidney stone    Primary cancer of thyroid with metastasis to other site ([place]) 05/04/2018   Rickettsia infection     Past Surgical History:  Procedure Laterality Date   BIOPSY  04/05/2018   Procedure: BIOPSY;  Surgeon: Ronald Lobo, MD;  Location: [facility]OS[facility];  Service: Endoscopy;;   CHOLECYSTECTOMY     ESOPHAGOGASTRODUODENOSCOPY (EGD) WITH PROPOFOL N/A 04/05/2018   Procedure: ESOPHAGOGASTRODUODENOSCOPY (EGD) WITH PROPOFOL;  Surgeon: Ronald Lobo, MD;  Location: [facility];  Service: Endoscopy;  Laterality: N/A;    Allergies: Ibuprofen; Pantoprazole; Tramadol; Gadobutrol; Gadolinium derivatives; Doxycycline; and Guaifenesin & derivatives  Medications: Prior to Admission medications   Medication Sig Start Date  End Date Taking? Authorizing Provider  diphenhydrAMINE (BENADRYL) 50 MG tablet Take one tablet one hour before scan 06/09/18  Yes Ennever, Rudell Cobb, MD  levothyroxine (SYNTHROID) 200 MCG tablet Take by mouth. 06/08/18  Yes [provider]  OVER THE COUNTER MEDICATION Take 1 Scoop by mouth daily. Barley Green Powder   Yes [provider]  EPINEPHrine (EPIPEN 2-PAK) 0.3 mg/0.3 mL IJ SOAJ injection USE AS DIRECTED FOR LIFE THREATENING ALLERGIC REACTIONS Patient taking differently: Inject 0.3 mg into the muscle as needed for anaphylaxis.  04/30/15   Kozlow, Donnamarie Poag, MD  mupirocin ointment (BACTROBAN) 2 % APPLY TO AFFECTED AREA DAILY AS NEEDED 05/26/18   [provider]  predniSONE (DELTASONE) 50 MG tablet Take one tablet 13h, 7h and 1h before scan. 06/09/18   [name], MD     Family History  Problem Relation Age of Onset   Diabetes Mother    Heart disease Mother    Heart failure Mother    Heart attack Mother    Hyperlipidemia Mother    Hypertension Mother    Hyperlipidemia Father    Allergic rhinitis Father    Rashes / Skin problems Father    Sudden death Neg Hx    Thyroid disease Neg Hx     Social History   Socioeconomic History   Marital status: Married    Spouse name: Not on file   Number of children: Not on file   Years of education: Not on file   Highest education level: Not on file  Occupational History   Not on file  Social Needs   Financial resource strain: Not on file   Food insecurity:    Worry: Not on file  Inability: Not on file   Transportation needs:    Medical: Not on file    Non-medical: Not on file  Tobacco Use   Smoking status: Never Smoker   Smokeless tobacco: Never Used  Substance and Sexual Activity   Alcohol use: No   Drug use: No   Sexual activity: Not on file  Lifestyle   Physical activity:    Days per week: Not on file    Minutes per session: Not on file   Stress: Not on file    Relationships   Social connections:    Talks on phone: Not on file    Gets together: Not on file    Attends religious service: Not on file    Active member of club or organization: Not on file    Attends meetings of clubs or organizations: Not on file    Relationship status: Not on file  Other Topics Concern   Not on file  Social History Narrative   Not on file     Review of Systems: A 12 point ROS discussed and pertinent positives are indicated in the HPI above.  All other systems are negative.  Review of Systems  Constitutional: Negative for chills and fever.  Respiratory: Negative for shortness of breath and wheezing.   Cardiovascular: Negative for chest pain and palpitations.  Gastrointestinal: Negative for abdominal pain.  Neurological: Negative for headaches.  Psychiatric/Behavioral: Negative for behavioral problems and confusion.    Vital Signs: BP (!) 138/99    Pulse 73    Temp 98.1 F (36.7 C) (Oral)    Resp 16    Ht 5\' 10"  (1.778 m)    Wt 264 lb (119.7 kg)    SpO2 100%    BMI 37.88 kg/m   Physical Exam Vitals signs and nursing note reviewed.  Constitutional:      General: He is not in acute distress.    Appearance: Normal appearance.  Cardiovascular:     Rate and Rhythm: Normal rate and regular rhythm.     Heart sounds: Normal heart sounds. No murmur.  Pulmonary:     Effort: Pulmonary effort is normal. No respiratory distress.     Breath sounds: Normal breath sounds. No wheezing.  Skin:    General: Skin is warm and dry.  Neurological:     Mental Status: He is alert and oriented to person, place, and time.  Psychiatric:        Mood and Affect: Mood normal.        Behavior: Behavior normal.        Thought Content: Thought content normal.        Judgment: Judgment normal.      MD Evaluation Airway: WNL Heart: WNL Abdomen: WNL Chest/ Lungs: WNL ASA  Classification: 3 Mallampati/Airway Score: One   Imaging: Mr Liver W Wo Contrast  Result  Date: 06/11/2018 CLINICAL DATA:  43 year old male with history of thyroid cancer. Evaluate for metastatic disease to the liver. EXAM: MRI ABDOMEN WITHOUT AND WITH CONTRAST TECHNIQUE: Multiplanar multisequence MR imaging of the abdomen was performed both before and after the administration of intravenous contrast. CONTRAST:  10 mL of Gadavist. COMPARISON:  No priors. FINDINGS: Lower chest: Unremarkable. Hepatobiliary: There are 2 liver lesions. The largest of these is in segment 8 of the liver (axial image 12 of series 7) measuring 1.7 x 2.1 cm which is low T1 signal intensity, high T2 signal intensity, demonstrates peripheral enhancement on portal venous phase imaging with minimal enhancement on  more delayed images, as well as some mild diffusion restriction. The smaller lesion is in the left lobe of the liver between segments 2 and 3 (axial image 14 of series 7) measuring 14 mm in diameter, also T1 hypointense, T2 hyperintense with early confluent hyperenhancement which persist on delayed phase imaging, favored to represent a flash fill cavernous hemangioma. No other suspicious hepatic lesions. No intra or extrahepatic biliary ductal dilatation. Status post cholecystectomy. Pancreas: No pancreatic mass. No pancreatic ductal dilatation. No pancreatic or peripancreatic fluid or inflammatory changes. Spleen:  Unremarkable. Adrenals/Urinary Tract: Bilateral kidneys and bilateral adrenal glands are normal in appearance. No hydroureteronephrosis in the visualized portions of the abdomen. Stomach/Bowel: Visualized portions are unremarkable. Vascular/Lymphatic: No aneurysm identified in the visualized abdominal vasculature. No lymphadenopathy noted in the abdomen. Other: No significant volume of ascites noted in the visualized portions of the abdomen. Musculoskeletal: No aggressive appearing osseous lesions are noted in the visualized portions of the skeleton. IMPRESSION: 1. 1.7 x 2.1 cm lesion in segment 8 of the liver  concerning for metastatic lesion. 2. Smaller lesion in the left lobe of the liver between segments 2 and 3 has imaging characteristics most compatible with a benign flash fill cavernous hemangioma. Electronically Signed   By: [name] M.D.   On: 06/11/2018 15:26    Labs:  CBC: Recent Labs    03/23/18 1509 06/09/18 1432 06/15/18 0630  WBC 5.1 4.4 4.7  HGB 14.3 14.7 15.3  HCT 41.4 43.2 45.4  PLT 198 210 197    COAGS: Recent Labs    06/15/18 0630  INR 1.0  APTT 27    BMP: Recent Labs    09/16/17 0604 03/23/18 1509 06/09/18 1432  NA 139 136 139  K 4.0 4.0 4.2  CL 104 102 103  CO2 26 28 29   GLUCOSE 120* 91 92  BUN 16 14 9   CALCIUM 9.0 9.8 10.1  CREATININE 0.93 0.89 0.86  GFRNONAA >60 >60 >60  GFRAA >60 >60 >60    LIVER FUNCTION TESTS: Recent Labs    03/23/18 1509 06/09/18 1432  BILITOT 1.2 0.7  AST 21 23  ALT 26 28  ALKPHOS 88 88  PROT 6.9 6.5  ALBUMIN 4.9 4.7     Assessment and Plan:  Liver lesion. Plan for image-guided liver lesion biopsy today with Dr. Anselm Pancoast. Patient is NPO. Afebrile and WBCs WNL. He does not take blood thinners. INR 1.0 seconds today.  Risks and benefits discussed with the patient including, but not limited to bleeding, infection, damage to adjacent structures or low yield requiring additional tests. All of the patient's questions were answered, patient is agreeable to proceed. Consent signed and in chart.   Thank you for this interesting consult.  I greatly enjoyed meeting Brian [name] and look forward to participating in their care.  A copy of this report was sent to the requesting provider on this date.  Electronically Signed: Earley Abide, PA-C 06/15/2018, 7:44 AM   I spent a total of 30 Minutes in face to face in clinical consultation, greater than 50% of which was counseling/coordinating care for liver lesion/biopsy.

## 2018-06-20 DIAGNOSIS — C73 Malignant neoplasm of thyroid gland: Secondary | ICD-10-CM | POA: Diagnosis not present

## 2018-06-23 ENCOUNTER — Telehealth: Payer: Self-pay | Admitting: Hematology & Oncology

## 2018-06-23 NOTE — Telephone Encounter (Signed)
Faxed FMLA to [employer]: [phone]   LEAVE #: [id number]      COPY SCANNED

## 2018-06-23 NOTE — Telephone Encounter (Signed)
Faxed medical records to: [facility] F: [phone] P: [phone]    for  Brian [name] 08-05-1975  [id number]      Copy Scanned

## 2018-06-24 DIAGNOSIS — C73 Malignant neoplasm of thyroid gland: Secondary | ICD-10-CM | POA: Diagnosis not present

## 2018-06-29 ENCOUNTER — Other Ambulatory Visit: Payer: Self-pay | Admitting: *Deleted

## 2018-06-29 ENCOUNTER — Encounter: Payer: Self-pay | Admitting: *Deleted

## 2018-06-29 DIAGNOSIS — C73 Malignant neoplasm of thyroid gland: Secondary | ICD-10-CM

## 2018-06-29 NOTE — Progress Notes (Signed)
Received a call from patient wanting to know where in the process his referral to [facility] for a second opinion was.  I was unaware of a referral to be placed and no order was in patient's chart. Followed up with Dr Marin Olp via his desk RN, and patient is in fact needing a second opinion with Dr Barrington Ellison at [facility]. Order placed. Scheduler sent message to please enter referral and send medical records.   Called patient and informed him of progress. Told him to call be on Friday if he still hadn't heard about an appointment. He agreed.

## 2018-07-01 ENCOUNTER — Encounter: Payer: Self-pay | Admitting: *Deleted

## 2018-07-06 DIAGNOSIS — C787 Secondary malignant neoplasm of liver and intrahepatic bile duct: Secondary | ICD-10-CM | POA: Diagnosis not present

## 2018-07-06 DIAGNOSIS — C770 Secondary and unspecified malignant neoplasm of lymph nodes of head, face and neck: Secondary | ICD-10-CM | POA: Diagnosis not present

## 2018-07-06 DIAGNOSIS — C73 Malignant neoplasm of thyroid gland: Secondary | ICD-10-CM | POA: Diagnosis not present

## 2018-07-06 DIAGNOSIS — C799 Secondary malignant neoplasm of unspecified site: Secondary | ICD-10-CM | POA: Diagnosis not present

## 2018-07-07 DIAGNOSIS — C73 Malignant neoplasm of thyroid gland: Secondary | ICD-10-CM | POA: Diagnosis not present

## 2018-07-07 DIAGNOSIS — C799 Secondary malignant neoplasm of unspecified site: Secondary | ICD-10-CM | POA: Diagnosis not present

## 2018-07-12 DIAGNOSIS — C73 Malignant neoplasm of thyroid gland: Secondary | ICD-10-CM | POA: Diagnosis not present

## 2018-07-12 DIAGNOSIS — R590 Localized enlarged lymph nodes: Secondary | ICD-10-CM | POA: Diagnosis not present

## 2018-07-15 DIAGNOSIS — C770 Secondary and unspecified malignant neoplasm of lymph nodes of head, face and neck: Secondary | ICD-10-CM | POA: Diagnosis not present

## 2018-07-20 DIAGNOSIS — C770 Secondary and unspecified malignant neoplasm of lymph nodes of head, face and neck: Secondary | ICD-10-CM | POA: Diagnosis not present

## 2018-07-20 DIAGNOSIS — C7989 Secondary malignant neoplasm of other specified sites: Secondary | ICD-10-CM | POA: Diagnosis not present

## 2018-07-20 DIAGNOSIS — C73 Malignant neoplasm of thyroid gland: Secondary | ICD-10-CM | POA: Diagnosis not present

## 2018-07-22 DIAGNOSIS — C73 Malignant neoplasm of thyroid gland: Secondary | ICD-10-CM | POA: Diagnosis not present

## 2018-07-22 DIAGNOSIS — E890 Postprocedural hypothyroidism: Secondary | ICD-10-CM | POA: Diagnosis not present

## 2018-07-22 DIAGNOSIS — E041 Nontoxic single thyroid nodule: Secondary | ICD-10-CM | POA: Diagnosis not present

## 2018-07-22 DIAGNOSIS — E0789 Other specified disorders of thyroid: Secondary | ICD-10-CM | POA: Diagnosis not present

## 2018-07-28 DIAGNOSIS — C799 Secondary malignant neoplasm of unspecified site: Secondary | ICD-10-CM | POA: Diagnosis not present

## 2018-07-28 DIAGNOSIS — C73 Malignant neoplasm of thyroid gland: Secondary | ICD-10-CM | POA: Diagnosis not present

## 2018-07-28 DIAGNOSIS — Z5111 Encounter for antineoplastic chemotherapy: Secondary | ICD-10-CM | POA: Diagnosis not present

## 2018-07-28 DIAGNOSIS — C760 Malignant neoplasm of head, face and neck: Secondary | ICD-10-CM | POA: Diagnosis not present

## 2018-07-29 DIAGNOSIS — C73 Malignant neoplasm of thyroid gland: Secondary | ICD-10-CM | POA: Insufficient documentation

## 2018-08-03 DIAGNOSIS — R110 Nausea: Secondary | ICD-10-CM | POA: Diagnosis not present

## 2018-08-03 DIAGNOSIS — C73 Malignant neoplasm of thyroid gland: Secondary | ICD-10-CM | POA: Diagnosis not present

## 2018-08-03 DIAGNOSIS — C760 Malignant neoplasm of head, face and neck: Secondary | ICD-10-CM | POA: Diagnosis not present

## 2018-08-03 DIAGNOSIS — C799 Secondary malignant neoplasm of unspecified site: Secondary | ICD-10-CM | POA: Diagnosis not present

## 2018-08-09 ENCOUNTER — Encounter: Payer: Self-pay | Admitting: *Deleted

## 2018-08-10 ENCOUNTER — Encounter: Payer: Self-pay | Admitting: *Deleted

## 2018-08-10 DIAGNOSIS — C73 Malignant neoplasm of thyroid gland: Secondary | ICD-10-CM | POA: Diagnosis not present

## 2018-08-10 DIAGNOSIS — Z5111 Encounter for antineoplastic chemotherapy: Secondary | ICD-10-CM | POA: Diagnosis not present

## 2018-08-17 ENCOUNTER — Other Ambulatory Visit: Payer: Self-pay | Admitting: *Deleted

## 2018-08-17 DIAGNOSIS — C73 Malignant neoplasm of thyroid gland: Secondary | ICD-10-CM

## 2018-08-23 ENCOUNTER — Telehealth: Payer: Self-pay | Admitting: Hematology & Oncology

## 2018-08-23 NOTE — Telephone Encounter (Signed)
Faxed medical records request to Dr. Antonieta Pert office after realizing pt. Is not a pt of [facility].

## 2018-08-24 ENCOUNTER — Telehealth: Payer: Self-pay | Admitting: Hematology & Oncology

## 2018-08-24 DIAGNOSIS — E063 Autoimmune thyroiditis: Secondary | ICD-10-CM | POA: Diagnosis not present

## 2018-08-24 DIAGNOSIS — E890 Postprocedural hypothyroidism: Secondary | ICD-10-CM | POA: Diagnosis not present

## 2018-08-24 DIAGNOSIS — Z7722 Contact with and (suspected) exposure to environmental tobacco smoke (acute) (chronic): Secondary | ICD-10-CM | POA: Diagnosis not present

## 2018-08-24 DIAGNOSIS — Z8249 Family history of ischemic heart disease and other diseases of the circulatory system: Secondary | ICD-10-CM | POA: Diagnosis not present

## 2018-08-24 DIAGNOSIS — C770 Secondary and unspecified malignant neoplasm of lymph nodes of head, face and neck: Secondary | ICD-10-CM | POA: Diagnosis not present

## 2018-08-24 DIAGNOSIS — Z79899 Other long term (current) drug therapy: Secondary | ICD-10-CM | POA: Diagnosis not present

## 2018-08-24 DIAGNOSIS — Z833 Family history of diabetes mellitus: Secondary | ICD-10-CM | POA: Diagnosis not present

## 2018-08-24 DIAGNOSIS — C787 Secondary malignant neoplasm of liver and intrahepatic bile duct: Secondary | ICD-10-CM | POA: Diagnosis not present

## 2018-08-24 DIAGNOSIS — C73 Malignant neoplasm of thyroid gland: Secondary | ICD-10-CM | POA: Diagnosis not present

## 2018-08-24 DIAGNOSIS — C801 Malignant (primary) neoplasm, unspecified: Secondary | ICD-10-CM | POA: Diagnosis not present

## 2018-08-24 DIAGNOSIS — Z87442 Personal history of urinary calculi: Secondary | ICD-10-CM | POA: Diagnosis not present

## 2018-08-24 NOTE — Telephone Encounter (Signed)
Faxed medical records to: [facility] F: [phone] P: [phone]   for   Brian [name] 12/11/1975      COPY SCANNED

## 2018-08-25 ENCOUNTER — Other Ambulatory Visit: Payer: Self-pay | Admitting: *Deleted

## 2018-08-25 ENCOUNTER — Telehealth: Payer: Self-pay | Admitting: Hematology & Oncology

## 2018-08-25 DIAGNOSIS — C73 Malignant neoplasm of thyroid gland: Secondary | ICD-10-CM

## 2018-08-25 DIAGNOSIS — Z5111 Encounter for antineoplastic chemotherapy: Secondary | ICD-10-CM | POA: Diagnosis not present

## 2018-08-25 NOTE — Progress Notes (Signed)
[facility] called and requested lab work for pt. Fax [phone]. Orders entered. Message to scheduled.

## 2018-08-25 NOTE — Telephone Encounter (Signed)
I called patient to give him appt for lab per 7/9 sch msg.  He advised that he was actually at [facility] and would not be back in town until next week.  He also stated that he just had labs drawn there and [facility] was aware of this also and knew he would not be able to come in this week for any labs.  I will send IB message to Malissa Hippo, RN

## 2018-08-26 DIAGNOSIS — C73 Malignant neoplasm of thyroid gland: Secondary | ICD-10-CM | POA: Diagnosis not present

## 2018-08-26 DIAGNOSIS — C787 Secondary malignant neoplasm of liver and intrahepatic bile duct: Secondary | ICD-10-CM | POA: Diagnosis not present

## 2018-08-26 DIAGNOSIS — C770 Secondary and unspecified malignant neoplasm of lymph nodes of head, face and neck: Secondary | ICD-10-CM | POA: Diagnosis not present

## 2018-09-01 ENCOUNTER — Other Ambulatory Visit: Payer: Self-pay | Admitting: *Deleted

## 2018-09-02 ENCOUNTER — Other Ambulatory Visit: Payer: Self-pay

## 2018-09-02 ENCOUNTER — Encounter: Payer: Self-pay | Admitting: Hematology & Oncology

## 2018-09-02 ENCOUNTER — Inpatient Hospital Stay (HOSPITAL_BASED_OUTPATIENT_CLINIC_OR_DEPARTMENT_OTHER): Payer: 59 | Admitting: Hematology & Oncology

## 2018-09-02 ENCOUNTER — Inpatient Hospital Stay: Payer: 59 | Attending: Hematology & Oncology

## 2018-09-02 VITALS — BP 157/111 | HR 59 | Temp 97.4°F | Resp 18 | Wt 263.0 lb

## 2018-09-02 DIAGNOSIS — I10 Essential (primary) hypertension: Secondary | ICD-10-CM | POA: Insufficient documentation

## 2018-09-02 DIAGNOSIS — R079 Chest pain, unspecified: Secondary | ICD-10-CM | POA: Insufficient documentation

## 2018-09-02 DIAGNOSIS — R52 Pain, unspecified: Secondary | ICD-10-CM | POA: Diagnosis not present

## 2018-09-02 DIAGNOSIS — C73 Malignant neoplasm of thyroid gland: Secondary | ICD-10-CM

## 2018-09-02 DIAGNOSIS — C787 Secondary malignant neoplasm of liver and intrahepatic bile duct: Secondary | ICD-10-CM | POA: Diagnosis not present

## 2018-09-02 LAB — CBC WITH DIFFERENTIAL (CANCER CENTER ONLY)
Abs Immature Granulocytes: 0 10*3/uL (ref 0.00–0.07)
Basophils Absolute: 0 10*3/uL (ref 0.0–0.1)
Basophils Relative: 1 %
Eosinophils Absolute: 0.1 10*3/uL (ref 0.0–0.5)
Eosinophils Relative: 2 %
HCT: 42.9 % (ref 39.0–52.0)
Hemoglobin: 14.7 g/dL (ref 13.0–17.0)
Immature Granulocytes: 0 %
Lymphocytes Relative: 35 %
Lymphs Abs: 1 10*3/uL (ref 0.7–4.0)
MCH: 29.8 pg (ref 26.0–34.0)
MCHC: 34.3 g/dL (ref 30.0–36.0)
MCV: 87 fL (ref 80.0–100.0)
Monocytes Absolute: 0.3 10*3/uL (ref 0.1–1.0)
Monocytes Relative: 11 %
Neutro Abs: 1.5 10*3/uL — ABNORMAL LOW (ref 1.7–7.7)
Neutrophils Relative %: 51 %
Platelet Count: 150 10*3/uL (ref 150–400)
RBC: 4.93 MIL/uL (ref 4.22–5.81)
RDW: 13.3 % (ref 11.5–15.5)
WBC Count: 2.9 10*3/uL — ABNORMAL LOW (ref 4.0–10.5)
nRBC: 0 % (ref 0.0–0.2)

## 2018-09-02 LAB — URINALYSIS, COMPLETE (UACMP) WITH MICROSCOPIC
Bilirubin Urine: NEGATIVE
Glucose, UA: NEGATIVE mg/dL
Hgb urine dipstick: NEGATIVE
Ketones, ur: NEGATIVE mg/dL
Leukocytes,Ua: NEGATIVE
Nitrite: NEGATIVE
Protein, ur: NEGATIVE mg/dL
RBC / HPF: NONE SEEN RBC/hpf (ref 0–5)
Specific Gravity, Urine: 1.01 (ref 1.005–1.030)
pH: 6 (ref 5.0–8.0)

## 2018-09-02 LAB — CMP (CANCER CENTER ONLY)
ALT: 28 U/L (ref 0–44)
AST: 26 U/L (ref 15–41)
Albumin: 4.2 g/dL (ref 3.5–5.0)
Alkaline Phosphatase: 85 U/L (ref 38–126)
Anion gap: 5 (ref 5–15)
BUN: 12 mg/dL (ref 6–20)
CO2: 31 mmol/L (ref 22–32)
Calcium: 8.6 mg/dL — ABNORMAL LOW (ref 8.9–10.3)
Chloride: 105 mmol/L (ref 98–111)
Creatinine: 0.87 mg/dL (ref 0.61–1.24)
GFR, Est AFR Am: >60 mL/min (ref >60)
GFR, Estimated: >60 mL/min (ref >60)
Glucose, Bld: 88 mg/dL (ref 70–99)
Potassium: 4.3 mmol/L (ref 3.5–5.1)
Sodium: 141 mmol/L (ref 135–145)
Total Bilirubin: 1 mg/dL (ref 0.3–1.2)
Total Protein: 6 g/dL — ABNORMAL LOW (ref 6.5–8.1)

## 2018-09-04 ENCOUNTER — Emergency Department (HOSPITAL_BASED_OUTPATIENT_CLINIC_OR_DEPARTMENT_OTHER)
Admission: EM | Admit: 2018-09-04 | Discharge: 2018-09-04 | Disposition: A | Discharge status: Home / Self Care | Payer: 59 | Attending: Emergency Medicine | Admitting: Emergency Medicine

## 2018-09-04 ENCOUNTER — Emergency Department (HOSPITAL_BASED_OUTPATIENT_CLINIC_OR_DEPARTMENT_OTHER): Payer: 59

## 2018-09-04 ENCOUNTER — Other Ambulatory Visit: Payer: Self-pay

## 2018-09-04 ENCOUNTER — Encounter (HOSPITAL_BASED_OUTPATIENT_CLINIC_OR_DEPARTMENT_OTHER): Payer: Self-pay | Admitting: Emergency Medicine

## 2018-09-04 DIAGNOSIS — C73 Malignant neoplasm of thyroid gland: Secondary | ICD-10-CM | POA: Diagnosis not present

## 2018-09-04 DIAGNOSIS — R5383 Other fatigue: Secondary | ICD-10-CM | POA: Insufficient documentation

## 2018-09-04 DIAGNOSIS — C3490 Malignant neoplasm of unspecified part of unspecified bronchus or lung: Secondary | ICD-10-CM | POA: Diagnosis not present

## 2018-09-04 DIAGNOSIS — R079 Chest pain, unspecified: Secondary | ICD-10-CM | POA: Diagnosis not present

## 2018-09-04 DIAGNOSIS — R0602 Shortness of breath: Secondary | ICD-10-CM | POA: Insufficient documentation

## 2018-09-04 LAB — BASIC METABOLIC PANEL
Anion gap: 8 (ref 5–15)
BUN: 15 mg/dL (ref 6–20)
CO2: 28 mmol/L (ref 22–32)
Calcium: 8.8 mg/dL — ABNORMAL LOW (ref 8.9–10.3)
Chloride: 104 mmol/L (ref 98–111)
Creatinine, Ser: 0.94 mg/dL (ref 0.61–1.24)
GFR calc Af Amer: >60 mL/min (ref >60)
GFR calc non Af Amer: >60 mL/min (ref >60)
Glucose, Bld: 79 mg/dL (ref 70–99)
Potassium: 4.2 mmol/L (ref 3.5–5.1)
Sodium: 140 mmol/L (ref 135–145)

## 2018-09-04 LAB — CBC WITH DIFFERENTIAL/PLATELET
Abs Immature Granulocytes: 0.01 10*3/uL (ref 0.00–0.07)
Basophils Absolute: 0 10*3/uL (ref 0.0–0.1)
Basophils Relative: 1 %
Eosinophils Absolute: 0 10*3/uL (ref 0.0–0.5)
Eosinophils Relative: 1 %
HCT: 44.8 % (ref 39.0–52.0)
Hemoglobin: 15.3 g/dL (ref 13.0–17.0)
Immature Granulocytes: 0 %
Lymphocytes Relative: 24 %
Lymphs Abs: 1 10*3/uL (ref 0.7–4.0)
MCH: 29.7 pg (ref 26.0–34.0)
MCHC: 34.2 g/dL (ref 30.0–36.0)
MCV: 87 fL (ref 80.0–100.0)
Monocytes Absolute: 0.4 10*3/uL (ref 0.1–1.0)
Monocytes Relative: 9 %
Neutro Abs: 2.6 10*3/uL (ref 1.7–7.7)
Neutrophils Relative %: 65 %
Platelets: 148 10*3/uL — ABNORMAL LOW (ref 150–400)
RBC: 5.15 MIL/uL (ref 4.22–5.81)
RDW: 13.3 % (ref 11.5–15.5)
WBC: 4 10*3/uL (ref 4.0–10.5)
nRBC: 0 % (ref 0.0–0.2)

## 2018-09-04 LAB — TROPONIN I (HIGH SENSITIVITY): Troponin I (High Sensitivity): 15 ng/L (ref <18)

## 2018-09-04 MED ORDER — ACETAMINOPHEN 500 MG PO TABS
1000.0000 mg | ORAL_TABLET | Freq: Once | ORAL | Status: AC
  Administered 2018-09-04: 1000 mg via ORAL
  Filled 2018-09-04: qty 2

## 2018-09-04 MED ORDER — IOHEXOL 350 MG/ML SOLN
100.0000 mL | Freq: Once | INTRAVENOUS | Status: AC | PRN
  Administered 2018-09-04: 100 mL via INTRAVENOUS

## 2018-09-04 NOTE — ED Notes (Signed)
ED Provider at bedside. 

## 2018-09-04 NOTE — ED Provider Notes (Signed)
[facility] EMERGENCY DEPARTMENT Provider Note   CSN: [id number] Arrival date & time: 09/04/18  2154    History   Chief Complaint Chief Complaint  Patient presents with  . Chest Pain    HPI Brian [name] is a 43 y.o. male.     Patient is a 43 year old male who presents with chest pain.  He has a history of hypertension and is currently being treated for thyroid cancer with metastases to the lung and chest.  He is currently on oral chemotherapy.  He states last night he had a discomfort in his right upper chest.  It went away and since he woke up this morning he has had a squeezing type pain in his central chest.  It radiates to his mid back between his shoulder blades.  He denies any pleuritic pain.  He does have a little bit of shortness of breath.  He has some nausea but no vomiting.  It is nonexertional.  He has no fevers.  No cough.  He noticed a little bit of swelling in his left leg as compared to his right earlier today.     Past Medical History:  Diagnosis Date  . Kidney stone   . Primary cancer of thyroid with metastasis to other site ([place]) 05/04/2018  . Rickettsia infection     Patient Active Problem List   Diagnosis Date Noted  . Primary cancer of thyroid with metastasis to other site ([place]) 05/04/2018  . Extrasystole 04/26/2018  . Dyspnea on exertion 04/26/2018  . Atypical chest pain 04/26/2018  . Ureteral stone with hydronephrosis 02/15/2018  . Right thyroid nodule 04/09/2017  . Bilateral knee pain 07/01/2016  . Left foot pain 02/06/2016  . Gastroesophageal reflux disease without esophagitis 05/08/2014  . Thrombocytopenia, unspecified ([place]) 06/13/2013  . 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 Low back pain 05/12/2012  . Allergic rhinitis 06/03/2009    Past Surgical History:  Procedure Laterality Date  . BIOPSY  04/05/2018   Procedure: BIOPSY;  Surgeon: Ronald Lobo, MD;  Location: [facility];  Service: Endoscopy;;  . CHOLECYSTECTOMY    .  ESOPHAGOGASTRODUODENOSCOPY (EGD) WITH PROPOFOL N/A 04/05/2018   Procedure: ESOPHAGOGASTRODUODENOSCOPY (EGD) WITH PROPOFOL;  Surgeon: Ronald Lobo, MD;  Location: [facility];  Service: Endoscopy;  Laterality: N/A;        Home Medications    Prior to Admission medications   Medication Sig Start Date End Date Taking? Authorizing Provider  EPINEPHrine (EPIPEN 2-PAK) 0.3 mg/0.3 mL IJ SOAJ injection USE AS DIRECTED FOR LIFE THREATENING ALLERGIC REACTIONS Patient taking differently: Inject 0.3 mg into the muscle as needed for anaphylaxis.  04/30/15   Kozlow, Donnamarie Poag, MD  lenvatinib 24 mg daily dose (LENVIMA) 2 x 10 MG & 4 MG capsule Take 20 mg by mouth daily. 08/25/18 09/24/18  [provider]  levothyroxine (SYNTHROID) 200 MCG tablet Take by mouth. 06/08/18   [provider]  lisinopril (ZESTRIL) 20 MG tablet Take 20 mg by mouth daily. 08/25/18 08/25/19  [provider]  ondansetron (ZOFRAN) 8 MG tablet Take 8 mg by mouth daily. 07/29/18   [provider]  oxyCODONE (OXY IR/ROXICODONE) 5 MG immediate release tablet Take 5 mg by mouth daily. 08/15/18   [provider]  predniSONE (DELTASONE) 50 MG tablet Take one tablet 13h, 7h and 1h before scan. 06/09/18   [name], MD  promethazine (PHENERGAN) 12.5 MG tablet Take 12.5 mg by mouth at bedtime. 07/29/18   [provider]    Family  History Family History  Problem Relation Age of Onset  . Diabetes Mother   . Heart disease Mother   . Heart failure Mother   . Heart attack Mother   . Hyperlipidemia Mother   . Hypertension Mother   . Hyperlipidemia Father   . Allergic rhinitis Father   . Rashes / Skin problems Father   . Sudden death Neg Hx   . Thyroid disease Neg Hx     Social History Social History   Tobacco Use  . Smoking status: Never Smoker  . Smokeless tobacco: Never Used  Substance Use Topics  . Alcohol use: No  . Drug use: No     Allergies   Ibuprofen, Pantoprazole,  Tramadol, Gadobutrol, Gadolinium derivatives, Doxycycline, and Guaifenesin & derivatives   Review of Systems Review of Systems  Constitutional: Positive for fatigue. Negative for chills, diaphoresis and fever.  HENT: Negative for congestion, rhinorrhea and sneezing.   Eyes: Negative.   Respiratory: Positive for shortness of breath. Negative for cough and chest tightness.   Cardiovascular: Positive for chest pain and leg swelling.  Gastrointestinal: Negative for abdominal pain, blood in stool, diarrhea, nausea and vomiting.  Genitourinary: Negative for difficulty urinating, flank pain, frequency and hematuria.  Musculoskeletal: Negative for arthralgias and back pain.  Skin: Negative for rash.  Neurological: Negative for dizziness, speech difficulty, weakness, numbness and headaches.     Physical Exam Updated Vital Signs BP (!) 186/108   Pulse 62   Temp 98.6 F (37 C) (Oral)   Resp (!) 26   Ht 5\' 10"  (1.778 m)   Wt 114.8 kg   SpO2 99%   BMI 36.30 kg/m   Physical Exam Constitutional:      Appearance: He is well-developed.  HENT:     Head: Normocephalic and atraumatic.  Eyes:     Pupils: Pupils are equal, round, and reactive to light.  Neck:     Musculoskeletal: Normal range of motion and neck supple.  Cardiovascular:     Rate and Rhythm: Normal rate and regular rhythm.     Heart sounds: Normal heart sounds.  Pulmonary:     Effort: Pulmonary effort is normal. No respiratory distress.     Breath sounds: Normal breath sounds. No wheezing or rales.  Chest:     Chest wall: No tenderness.  Abdominal:     General: Bowel sounds are normal.     Palpations: Abdomen is soft.     Tenderness: There is no abdominal tenderness. There is no guarding or rebound.  Musculoskeletal: Normal range of motion.     Right lower leg: No edema.     Left lower leg: No edema.  Lymphadenopathy:     Cervical: No cervical adenopathy.  Skin:    General: Skin is warm and dry.     Findings: No  rash.  Neurological:     Mental Status: He is alert and oriented to person, place, and time.      ED Treatments / Results  Labs (all labs ordered are listed, but only abnormal results are displayed) Labs Reviewed  BASIC METABOLIC PANEL - Abnormal; Notable for the following components:      Result Value   Calcium 8.8 (*)    All other components within normal limits  CBC WITH DIFFERENTIAL/PLATELET - Abnormal; Notable for the following components:   Platelets 148 (*)    All other components within normal limits  TROPONIN I (HIGH SENSITIVITY)    EKG EKG Interpretation  Date/Time:  Sunday September 04 2018 22:02:55 EDT Ventricular Rate:  68 PR Interval:    QRS Duration: 88 QT Interval:  404 QTC Calculation: 430 R Axis:   19 Text Interpretation:  Sinus rhythm Baseline wander in lead(s) V4 V5 since last tracing no significant change Confirmed by Malvin Johns [phone]) on 09/04/2018 10:11:30 PM   Radiology Ct Angio Chest Pe W/cm &/or Wo Cm  Result Date: 09/04/2018 CLINICAL DATA:  Chest pain, PE suspected, high prob. Recent diagnosis of thyroid cancer with metastasis. EXAM: CT ANGIOGRAPHY CHEST WITH CONTRAST TECHNIQUE: Multidetector CT imaging of the chest was performed using the standard protocol during bolus administration of intravenous contrast. Multiplanar CT image reconstructions and MIPs were obtained to evaluate the vascular anatomy. CONTRAST:  164mL OMNIPAQUE IOHEXOL 350 MG/ML SOLN COMPARISON:  PET CT 03/28/2018. No interval imaging. FINDINGS: Cardiovascular: There are no filling defects within the pulmonary arteries to suggest pulmonary embolus. The thoracic aorta is normal in caliber. No periaortic stranding. Cannot assess for dissection given phase of contrast tailored to pulmonary artery evaluation. No pericardial effusion. Heart upper normal in size. Mediastinum/Nodes: Increase size of right upper paratracheal lymph node currently 12 mm, previously 6 mm. Right paratracheal nodule,  image 10 series 5, appear similar to prior PET. No esophageal wall thickening. Lungs/Pleura: Incidental azygos fissure. Minor lingular and left lower lobe atelectasis. No confluent airspace disease. No pleural effusion. No pulmonary edema. No pulmonary mass. Trachea and mainstem bronchi are patent. Upper Abdomen: Low-density lesion in the right lobe of the liver, previously characterized on MRI 06/11/2018. Prominent spleen spanning 15 cm. Musculoskeletal: There are no acute or suspicious osseous abnormalities. Review of the MIP images confirms the above findings. IMPRESSION: 1. No pulmonary embolus. 2. Increased size of right paratracheal lymph node, currently 12 mm, previously 6 mm since February 2020 PET. No other acute finding. Electronically Signed   By: Keith Rake M.D.   On: 09/04/2018 23:12    Procedures Procedures (including critical care time)  Medications Ordered in ED Medications  iohexol (OMNIPAQUE) 350 MG/ML injection 100 mL (100 mLs Intravenous Contrast Given 09/04/18 2252)  acetaminophen (TYLENOL) tablet 1,000 mg (1,000 mg Oral Given 09/04/18 2326)     Initial Impression / Assessment and Plan / ED Course  I have reviewed the triage vital signs and the nursing notes.  Pertinent labs & imaging results that were available during my care of the patient were reviewed by me and considered in my medical decision making (see chart for details).        Patient is a 43 year old male who presents with central chest pain.  He has a little bit of shortness of breath.  He has not exertional symptoms but describes as a constant tightness.  He declined any pain medication in the ED because he is driving home.  I did do a CT scan of his chest which showed no evidence of pulmonary emboli.  No obvious pericardial effusion.  The thoracic aorta was normal caliber.  His troponin is negative.  I did not feel repeat troponin was necessary given that his pain is been constant all day.  He has pain  medication at home to take and will follow-up tomorrow with his oncologist.  There was enlargement of the right paratracheal node which may be the etiology of his symptoms.  Return precautions were given.  Final Clinical Impressions(s) / ED Diagnoses   Final diagnoses:  Nonspecific chest pain    ED Discharge Orders    None  Malvin Johns, MD 09/04/18 2340

## 2018-09-04 NOTE — ED Triage Notes (Signed)
Pt states he started to experience a squeezing in his central chest that has gotten worse and now radiating to his back. Denies SHob, vomiting.  + Nausea.

## 2018-09-05 ENCOUNTER — Ambulatory Visit: Payer: 59 | Admitting: Hematology & Oncology

## 2018-09-05 ENCOUNTER — Other Ambulatory Visit: Payer: 59

## 2018-09-05 ENCOUNTER — Other Ambulatory Visit: Payer: Self-pay | Admitting: Family

## 2018-09-05 ENCOUNTER — Inpatient Hospital Stay (HOSPITAL_BASED_OUTPATIENT_CLINIC_OR_DEPARTMENT_OTHER): Payer: 59 | Admitting: Hematology & Oncology

## 2018-09-05 ENCOUNTER — Encounter: Payer: Self-pay | Admitting: Hematology & Oncology

## 2018-09-05 ENCOUNTER — Other Ambulatory Visit: Payer: Self-pay

## 2018-09-05 VITALS — BP 162/90 | HR 62 | Temp 98.8°F | Resp 19

## 2018-09-05 DIAGNOSIS — C73 Malignant neoplasm of thyroid gland: Secondary | ICD-10-CM | POA: Diagnosis not present

## 2018-09-05 DIAGNOSIS — C787 Secondary malignant neoplasm of liver and intrahepatic bile duct: Secondary | ICD-10-CM | POA: Diagnosis not present

## 2018-09-05 DIAGNOSIS — R52 Pain, unspecified: Secondary | ICD-10-CM | POA: Diagnosis not present

## 2018-09-05 DIAGNOSIS — I10 Essential (primary) hypertension: Secondary | ICD-10-CM

## 2018-09-05 DIAGNOSIS — R079 Chest pain, unspecified: Secondary | ICD-10-CM

## 2018-09-05 MED ORDER — OXYCODONE HCL 5 MG PO TABS: 5.0000 mg | ORAL_TABLET | Freq: Four times a day (QID) | ORAL | 0 refills | Status: DC | PRN

## 2018-09-05 MED ORDER — CLONIDINE HCL 0.2 MG PO TABS: 0.2000 mg | ORAL_TABLET | Freq: Every day | ORAL | 4 refills | Status: DC

## 2018-09-05 MED ORDER — PROMETHAZINE HCL 12.5 MG PO TABS: 12.5000 mg | ORAL_TABLET | Freq: Every day | ORAL | 3 refills | Status: DC

## 2018-09-05 NOTE — Progress Notes (Signed)
Hematology and Oncology Follow Up Visit  Brian [name] [id number] [birth year] 43 y.o. 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  He apparently was seen in the emergency room last night.  He had a substernal chest pain.  He had a CT angiogram that was negative for pulmonary embolism.  However, I did see a right paratracheal lymph node measuring 12 mm.  On the last scan that he had done here, this lymph node measured 6 mm.  The pain seems to be a little better.  His blood pressure still quite high.  The doubled his dose of lisinopril.  I will add clonidine.  I will add clonidine 0.2 mg p.o. daily to his protocol for antihypertensive.  I am not sure where this pain is coming on.  I do not know what this is any kind of reflux.  The pain seems to radiate little bit to the back.  He does have a history of cardiac disease in the family.  He had an EKG which was unremarkable.  I think his troponin levels were okay.  Of course, he was told to see me today.  He has had no diaphoresis.  There is no diarrhea.  He said he did have a little bit of diarrhea after drinking the contrast.  He has had no leg pain.  There is been no leg swelling.  He has had no fever.  There is been no cough.  Overall, I was his performance status is ECOG 1.    Medications:  Current Outpatient Medications:  .  EPINEPHrine (EPIPEN 2-PAK) 0.3 mg/0.3 mL IJ SOAJ injection, USE AS DIRECTED FOR LIFE THREATENING ALLERGIC REACTIONS (Patient taking differently: Inject 0.3 mg into the muscle as needed for anaphylaxis. ), Disp: 4 Device, Rfl: 3 .  lenvatinib 24 mg daily dose (LENVIMA) 2 x 10 MG & 4 MG capsule, Take 20 mg by mouth daily., Disp: , Rfl:  .  levothyroxine (SYNTHROID) 200 MCG tablet, Take by mouth., Disp: , Rfl:  .   lisinopril (ZESTRIL) 20 MG tablet, Take 20 mg by mouth daily., Disp: , Rfl:  .  ondansetron (ZOFRAN) 8 MG tablet, Take 8 mg by mouth daily., Disp: , Rfl:  .  oxyCODONE (OXY IR/ROXICODONE) 5 MG immediate release tablet, Take 5 mg by mouth daily., Disp: , Rfl:  .  predniSONE (DELTASONE) 50 MG tablet, Take one tablet 13h, 7h and 1h before scan., Disp: 3 tablet, Rfl: 3 .  promethazine (PHENERGAN) 12.5 MG tablet, Take 12.5 mg by mouth at bedtime., Disp: , Rfl:   Allergies:  Allergies  Allergen Reactions  . Doxycycline Other (See Comments), Anaphylaxis, Nausea And Vomiting and Rash    "heart arrythmia" and "dyspepsia" "heart arrythmia" and "dyspepsia" (only oral doxycycline causes reaction) Only to Oral doxycycline (only oral doxycycline causes reaction)  . Guaifenesin Other (See Comments) and Palpitations    Reaction unknown Reaction unknown  . Ibuprofen Itching  . Pantoprazole Itching  . Tramadol Hives and Itching  . Dextromethorphan-Guaifenesin Other (See Comments)    Irregular heartbeat Irregular heartbeat Irregular heartbeat  . Gadobutrol Hives and Other (See Comments)    Patient had MRI scan at [place]. Patient called one hour after he left imaging facility to report two "blisters" that came  up on "back" of lip.  Patient had MRI scan at [place]. Patient called one hour after he left imaging facility to report two "blisters" that came up on "back" of lip.  Patient had MRI scan at [place]. Patient called one hour after he left imaging facility to report two "blisters" that came up on "back" of lip.   . Gadolinium Derivatives Hives and Other (See Comments)    Patient had MRI scan at [place]. Patient called one hour after he left imaging facility to report two "blisters" that came up on "back" of lip.   . Guaifenesin & Derivatives Other (See Comments)    Reaction unknown    Past Medical History, Surgical history, Social history, and Family  History were reviewed and updated.  Review of Systems: Review of Systems  Constitutional: Negative.   HENT:   Positive for lump/mass and sore throat.   Eyes: Negative.   Respiratory: Negative.   Cardiovascular: Negative.   Gastrointestinal: Negative.   Endocrine: Negative.   Genitourinary: Negative.    Musculoskeletal: Negative.   Skin: Negative.   Neurological: Negative.   Hematological: Negative.   Psychiatric/Behavioral: Negative.     Physical Exam:  oral temperature is 98.8 F (37.1 C). His blood pressure is 162/90 (abnormal) and his pulse is 62. His respiration is 19 and oxygen saturation is 99%.   Wt Readings from Last 3 Encounters:  09/04/18 253 lb (114.8 kg)  09/02/18 263 lb (119.3 kg)  06/15/18 264 lb (119.7 kg)    Physical Exam Vitals signs reviewed.  HENT:     Head: Normocephalic and atraumatic.  Eyes:     Pupils: Pupils are equal, round, and reactive to light.  Neck:     Musculoskeletal: Normal range of motion.     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  This is well-healing.  There is some slight fullness but no distinct mass noted in the neck. Cardiovascular:     Rate and Rhythm: Normal rate and regular rhythm.     Heart sounds: Normal heart sounds.  Pulmonary:     Effort: Pulmonary effort is normal.     Breath sounds: Normal breath sounds.  Abdominal:     General: Bowel sounds are normal.     Palpations: Abdomen is soft.  Musculoskeletal: Normal range of motion.        General: No tenderness or deformity.  Lymphadenopathy:     Cervical: No cervical adenopathy.  Skin:    General: Skin is warm and dry.     Findings: No erythema or rash.  Neurological:     Mental Status: He is alert and oriented to person, place, and time.  Psychiatric:        Behavior: Behavior normal.        Thought Content: Thought content normal.        Judgment: Judgment normal.      Lab Results  Component Value Date    WBC 4.0 09/04/2018   HGB 15.3 09/04/2018   HCT 44.8 09/04/2018   MCV 87.0 09/04/2018   PLT 148 (L) 09/04/2018     Chemistry      Component Value Date/Time   NA 140 09/04/2018 2207   K 4.2 09/04/2018 2207   CL 104 09/04/2018 2207   CO2 28 09/04/2018 2207   BUN 15 09/04/2018 2207   CREATININE 0.94 09/04/2018 2207   CREATININE 0.87 09/02/2018 4401  Component Value Date/Time   CALCIUM 8.8 (L) 09/04/2018 2207   ALKPHOS 85 09/02/2018 0832   AST 26 09/02/2018 0832   ALT 28 09/02/2018 0832   BILITOT 1.0 09/02/2018 0832      Impression and Plan: Brian [name] is a 43 year old white male.  He has a incredibly unusual metastatic Hurthle cell tumor of the thyroid.  He is being followed by the endocrine malignancy clinic at [facility].  He would like to have a lot of his follow-up in [place] because of the distance that it takes to go to [employer].  We will be more than happy to follow him in our office.  Again, I am unsure as to what is going on with the chest pain.  He would not think that this is cardiac in nature.  Again I would find it hard to believe that a 12 mm lymph node would cause all these problems.  He apparently has another area of activity in the right paratracheal area.  This is little bit larger.  This certainly could be causing some discomfort.  He is still due for his scans to be done in a few weeks.  By that, he would have been on the [facility] for about 2 months.  I spent about 30 minutes with him today.     [name], MD 7/20/20204:57 PM

## 2018-09-05 NOTE — Progress Notes (Signed)
Hematology and Oncology Follow Up Visit  Brian [name] [id number] [birth year] 43 y.o. 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  He actually has been out at [facility].  He has seen Dr. Barrington Ellison.  The pathologist at [facility] have done an extensive evaluation of his pathology.  They feel that this is a Hurthle cell.  He is now on Lenvima.  He has been on Lenvima now for about 3 weeks.  His blood pressure is quite high.  I told him to double up on the lisinopril.  With this will help get his pressure down.  He also went to see a doctor at the [facility].  They really did not have anything else to add to the diagnosis or treatment plan.  He goes back to see a Psychologist, sport and exercise today at [facility].  He is still trying to work.  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  He has had no problems eating.  He has had no problems swallowing.  He has had no abdominal pain.  He has had no change in bowel or bladder habits.  He has had no diarrhea.  There is been no leg swelling.  He has had no rashes.  He actually had a CT angiogram done on 09/04/2018.  This was because of some chest pain.  This was negative for any obvious pulmonary embolism.  There was an increased size of a right paratracheal lymph node now measuring 12 mm.  He has had no headache.  Where he had his right radical neck surgery, this seems to have healed pretty nicely.  He has some numbness in this area.  Overall, I was his performance status is ECOG 1.    Medications:  Current Outpatient Medications:  .  lenvatinib 24 mg daily dose (LENVIMA) 2 x 10 MG & 4 MG capsule, Take 20 mg by mouth daily., Disp: , Rfl:  .  lisinopril (ZESTRIL) 20 MG tablet, Take 20 mg by mouth  daily., Disp: , Rfl:  .  ondansetron (ZOFRAN) 8 MG tablet, Take 8 mg by mouth daily., Disp: , Rfl:  .  oxyCODONE (OXY IR/ROXICODONE) 5 MG immediate release tablet, Take 5 mg by mouth daily., Disp: , Rfl:  .  promethazine (PHENERGAN) 12.5 MG tablet, Take 12.5 mg by mouth at bedtime., Disp: , Rfl:  .  EPINEPHrine (EPIPEN 2-PAK) 0.3 mg/0.3 mL IJ SOAJ injection, USE AS DIRECTED FOR LIFE THREATENING ALLERGIC REACTIONS (Patient taking differently: Inject 0.3 mg into the muscle as needed for anaphylaxis. ), Disp: 4 Device, Rfl: 3 .  levothyroxine (SYNTHROID) 200 MCG tablet, Take by mouth., Disp: , Rfl:  .  predniSONE (DELTASONE) 50 MG tablet, Take one tablet 13h, 7h and 1h before scan., Disp: 3 tablet, Rfl: 3  Allergies:  Allergies  Allergen Reactions  . Ibuprofen Itching  . Pantoprazole Itching  . Tramadol Hives and Itching  . Gadobutrol Hives and Other (See Comments)    Patient had MRI scan at [place]. Patient called one hour after he left imaging facility to report two "blisters" that came up on "back" of lip.   . Gadolinium Derivatives Hives and Other (  See Comments)    Patient had MRI scan at [place]. Patient called one hour after he left imaging facility to report two "blisters" that came up on "back" of lip.   Marland Kitchen Doxycycline Other (See Comments)    "heart arrythmia" and "dyspepsia"  . Guaifenesin & Derivatives Other (See Comments)    Reaction unknown    Past Medical History, Surgical history, Social history, and Family History were reviewed and updated.  Review of Systems: Review of Systems  Constitutional: Negative.   HENT:   Positive for lump/mass and sore throat.   Eyes: Negative.   Respiratory: Negative.   Cardiovascular: Negative.   Gastrointestinal: Negative.   Endocrine: Negative.   Genitourinary: Negative.    Musculoskeletal: Negative.   Skin: Negative.   Neurological: Negative.   Hematological: Negative.   Psychiatric/Behavioral: Negative.      Physical Exam:  weight is 263 lb (119.3 kg). His oral temperature is 97.4 F (36.3 C) (abnormal). His blood pressure is 157/111 (abnormal) and his pulse is 59 (abnormal). His respiration is 18 and oxygen saturation is 97%.   Wt Readings from Last 3 Encounters:  09/04/18 253 lb (114.8 kg)  09/02/18 263 lb (119.3 kg)  06/15/18 264 lb (119.7 kg)    Physical Exam Vitals signs reviewed.  HENT:     Head: Normocephalic and atraumatic.  Eyes:     Pupils: Pupils are equal, round, and reactive to light.  Neck:     Musculoskeletal: Normal range of motion.     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  This is well-healing.  There is some slight fullness but no distinct mass noted in the neck. Cardiovascular:     Rate and Rhythm: Normal rate and regular rhythm.     Heart sounds: Normal heart sounds.  Pulmonary:     Effort: Pulmonary effort is normal.     Breath sounds: Normal breath sounds.  Abdominal:     General: Bowel sounds are normal.     Palpations: Abdomen is soft.  Musculoskeletal: Normal range of motion.        General: No tenderness or deformity.  Lymphadenopathy:     Cervical: No cervical adenopathy.  Skin:    General: Skin is warm and dry.     Findings: No erythema or rash.  Neurological:     Mental Status: He is alert and oriented to person, place, and time.  Psychiatric:        Behavior: Behavior normal.        Thought Content: Thought content normal.        Judgment: Judgment normal.      Lab Results  Component Value Date   WBC 4.0 09/04/2018   HGB 15.3 09/04/2018   HCT 44.8 09/04/2018   MCV 87.0 09/04/2018   PLT 148 (L) 09/04/2018     Chemistry      Component Value Date/Time   NA 140 09/04/2018 2207   K 4.2 09/04/2018 2207   CL 104 09/04/2018 2207   CO2 28 09/04/2018 2207   BUN 15 09/04/2018 2207   CREATININE 0.94 09/04/2018 2207   CREATININE 0.87 09/02/2018 0832      Component Value Date/Time    CALCIUM 8.8 (L) 09/04/2018 2207   ALKPHOS 85 09/02/2018 0832   AST 26 09/02/2018 0832   ALT 28 09/02/2018 0832   BILITOT 1.0 09/02/2018 0832      Impression and Plan: Brian [name] is a 43 year old  white male.  He has a incredibly unusual metastatic Hurthle cell tumor of the thyroid.  He is being followed by the endocrine malignancy clinic at [facility].  He would like to have a lot of his follow-up in [place] because of the distance that it takes to go to [place].  We will be more than happy to follow him in our office.  He probably will be due for another MRI of the liver in about 3 or 4 weeks.  I think this will give [place] a better idea as to how well he is responding.  Hopefully he will respond to therapy.  Of note, his only disease that has metastatic I think is in the liver.  No on the CT angiogram that he had done yesterday, this did show a enlarged right paratracheal lymph node.  I know that he is doing all that we ask of him.  Hopefully the increase in lisinopril will help with his blood pressure.  I spent about 40 minutes with him today.  This is incredibly complicated as this is a rare kind of cancer.  There really is no "standard of care" therapy for this.  His molecular profile really cannot shows anything that we could target.  The cancer is RET wild-type.  I know there is a new oral agent for RET mutated lung and thyroid cancer.  He has a low PD-L1.  There is no other mutation that we really could target.    We will see him back in 4 weeks.   [name], MD 7/20/20207:14 AM

## 2018-09-06 ENCOUNTER — Encounter: Payer: Self-pay | Admitting: Hematology & Oncology

## 2018-09-09 ENCOUNTER — Telehealth (INDEPENDENT_AMBULATORY_CARE_PROVIDER_SITE_OTHER): Payer: 59 | Admitting: Cardiology

## 2018-09-09 ENCOUNTER — Encounter: Payer: Self-pay | Admitting: Cardiology

## 2018-09-09 ENCOUNTER — Other Ambulatory Visit: Payer: Self-pay

## 2018-09-09 VITALS — BP 112/88

## 2018-09-09 DIAGNOSIS — R0600 Dyspnea, unspecified: Secondary | ICD-10-CM

## 2018-09-09 DIAGNOSIS — R0609 Other forms of dyspnea: Secondary | ICD-10-CM

## 2018-09-09 DIAGNOSIS — I10 Essential (primary) hypertension: Secondary | ICD-10-CM

## 2018-09-09 DIAGNOSIS — R0789 Other chest pain: Secondary | ICD-10-CM | POA: Diagnosis not present

## 2018-09-09 DIAGNOSIS — I4949 Other premature depolarization: Secondary | ICD-10-CM

## 2018-09-09 DIAGNOSIS — C73 Malignant neoplasm of thyroid gland: Secondary | ICD-10-CM

## 2018-09-09 HISTORY — DX: Essential (primary) hypertension: I10

## 2018-09-09 NOTE — Progress Notes (Signed)
Virtual Visit via Video Note   This visit type was conducted due to national recommendations for restrictions regarding the COVID-19 Pandemic (e.g. social distancing) in an effort to limit this patient's exposure and mitigate transmission in our community.  Due to his co-morbid illnesses, this patient is at least at moderate risk for complications without adequate follow up.  This format is felt to be most appropriate for this patient at this time.  All issues noted in this document were discussed and addressed.  A limited physical exam was performed with this format.  Please refer to the patient's chart for his consent to telehealth for [facility].  Evaluation Performed:  Follow-up visit  This visit type was conducted due to national recommendations for restrictions regarding the COVID-19 Pandemic (e.g. social distancing).  This format is felt to be most appropriate for this patient at this time.  All issues noted in this document were discussed and addressed.  No physical exam was performed (except for noted visual exam findings with Video Visits).  Please refer to the patient's chart (MyChart message for video visits and phone note for telephone visits) for the patient's consent to telehealth for [facility].  Date:  09/09/2018  ID: Brian [name], DOB 1975/05/25, MRN [id number]   Patient Location: [place] [address]   Provider location:   [place] [facility]  PCP:  Sharyne Richters, MD  Cardiologist:  Jenne Campus, MD     Chief Complaint: Doing better  History of Present Illness:    Brian [name] is a 43 y.o. male  who presents via audio/video conferencing for a telehealth visit today.  With complex past medical history.  I did see him in cardiology clinic for palpitations.  Likely we did not find any alarming pathology in his heart.  He struggling with thyroid cancer that metastasized.  He is on lifelong chemotherapy.  Overall he seems to be doing well  however still having difficulty with the blood pressure.  He also stopped after having some chest pain.  Pain is atypical located in the right side of his chest lasting for days however that is been stable right now he does not have any more the pain.  Denies having any palpitations.  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  He said it somewhat difficult for him to do this but he is trying to do his best.  He described the fact that he was very tired and exhausted initially after he is blood pressure medication being initiated.  But now things stabilize.   The patient does not have symptoms concerning for COVID-19 infection (fever, chills, cough, or new SHORTNESS OF BREATH).    Prior CV studies:   The following studies were reviewed today:       Past Medical History:  Diagnosis Date  . Essential hypertension 09/09/2018  . Kidney stone   . Primary cancer of thyroid with metastasis to other site ([place]) 05/04/2018  . Rickettsia infection     Past Surgical History:  Procedure Laterality Date  . BIOPSY  04/05/2018   Procedure: BIOPSY;  Surgeon: Ronald Lobo, MD;  Location: [facility];  Service: Endoscopy;;  . CHOLECYSTECTOMY    . ESOPHAGOGASTRODUODENOSCOPY (EGD) WITH PROPOFOL N/A 04/05/2018   Procedure: ESOPHAGOGASTRODUODENOSCOPY (EGD) WITH PROPOFOL;  Surgeon: Ronald Lobo, MD;  Location: [facility];  Service: Endoscopy;  Laterality: N/A;     Current  Meds  Medication Sig  . cloNIDine (CATAPRES) 0.2 MG tablet Take 1 tablet (0.2 mg total) by mouth daily.  Marland Kitchen EPINEPHrine (EPIPEN 2-PAK) 0.3 mg/0.3 mL IJ SOAJ injection USE AS DIRECTED FOR LIFE THREATENING ALLERGIC REACTIONS (Patient taking differently: Inject 0.3 mg into the muscle as needed for anaphylaxis. )  . lenvatinib 24 mg daily dose (LENVIMA) 2 x 10 MG & 4 MG capsule Take 20 mg by mouth daily.  Marland Kitchen levothyroxine (SYNTHROID) 200 MCG tablet Take by mouth.  Marland Kitchen  lisinopril (ZESTRIL) 20 MG tablet Take 20 mg by mouth daily.  . ondansetron (ZOFRAN) 8 MG tablet Take 8 mg by mouth daily.  Marland Kitchen oxyCODONE (OXY IR/ROXICODONE) 5 MG immediate release tablet Take 1 tablet (5 mg total) by mouth every 6 (six) hours as needed for severe pain.  . predniSONE (DELTASONE) 50 MG tablet Take one tablet 13h, 7h and 1h before scan.  . promethazine (PHENERGAN) 12.5 MG tablet Take 1 tablet (12.5 mg total) by mouth at bedtime.      Family History: The patient's family history includes Allergic rhinitis in his father; Diabetes in his mother; Heart attack in his mother; Heart disease in his mother; Heart failure in his mother; Hyperlipidemia in his father and mother; Hypertension in his mother; Rashes / Skin problems in his father. There is no history of Sudden death or Thyroid disease.   ROS:   Please see the history of present illness.     All other systems reviewed and are negative.   Labs/Other Tests and Data Reviewed:     Recent Labs: 06/09/2018: TSH 11.425 09/02/2018: ALT 28 09/04/2018: BUN 15; Creatinine, Ser 0.94; Hemoglobin 15.3; Platelets 148; Potassium 4.2; Sodium 140  Recent Lipid Panel    Component Value Date/Time   CHOL  10/08/2008 0655    119        ATP III CLASSIFICATION:  <200     mg/dL   Desirable  200-239  mg/dL   Borderline High  >=240    mg/dL   High          TRIG 84 10/08/2008 0655   HDL 20 (L) 10/08/2008 0655   CHOLHDL 6.0 10/08/2008 0655   VLDL 17 10/08/2008 0655   LDLCALC  10/08/2008 0655    82        Total Cholesterol/HDL:CHD Risk Coronary Heart Disease Risk Table                     Men   Women  1/2 Average Risk   3.4   3.3  Average Risk       5.0   4.4  2 X Average Risk   9.6   7.1  3 X Average Risk  23.4   11.0        Use the calculated Patient Ratio above and the CHD Risk Table to determine the patient's CHD Risk.        ATP III CLASSIFICATION (LDL):  <100     mg/dL   Optimal  100-129  mg/dL   Near or Above                     Optimal  130-159  mg/dL   Borderline  160-189  mg/dL   High  >190     mg/dL   Very High      Exam:    Vital Signs:  BP 112/88     Wt Readings from Last 3 Encounters:  09/04/18 253  lb (114.8 kg)  09/02/18 263 lb (119.3 kg)  06/15/18 264 lb (119.7 kg)     Well nourished, well developed in no acute distress. I am talking to him with a video link.  He is sitting in his car in front of the med center.  Denies having any chest pain tightness squeezing pressure burning chest asymptomatic at the time of my interview.  Diagnosis for this visit:   1. Extrasystole   2. Primary cancer of thyroid with metastasis to other site ([place])   3. Dyspnea on exertion   4. Atypical chest pain   5. Essential hypertension      ASSESSMENT & PLAN:    1.  Extrasystole denies having any palpitation will continue present management. Primary cause of thyroid with metastasis managed excellently by oncology team. 3.  Dyspnea on exertion denies having any. 4.  Atypical chest pain.  Lasting 4 days straight.  On the right side of his chest recent visit in the emergency room reviewed.  Showed normal troponins.  CT of his chest was negative for PE.  However he was find to have some enlarged lymph node. 4.  Essential hypertension now controlled with 0.2 mg of clonidine as well as 40 mg of lisinopril.  I will continue with that management.  In the future he have some difficulty controlling his blood pressure asked him to let me know this very we will adjust his medication   COVID-19 Education: The signs and symptoms of COVID-19 were discussed with the patient and how to seek care for testing (follow up with PCP or arrange E-visit).  The importance of social distancing was discussed today.  Patient Risk:   After full review of this patients clinical status, I feel that they are at least moderate risk at this time.  Time:   Today, I have spent 16 minutes with the patient with telehealth technology discussing  pt health issues.  I spent 5 minutes reviewing her chart before the visit.  Visit was finished at 4:12 PM.    Medication Adjustments/Labs and Tests Ordered: Current medicines are reviewed at length with the patient today.  Concerns regarding medicines are outlined above.  No orders of the defined types were placed in this encounter.  Medication changes: No orders of the defined types were placed in this encounter.    Disposition: Follow-up with in 3 months  Signed, Park Liter, MD, [facility] 09/09/2018 [place]

## 2018-09-09 NOTE — Patient Instructions (Signed)
Medication Instructions:  .instcut  If you need a refill on your cardiac medications before your next appointment, please call your pharmacy.   Lab work: None.  If you have labs (blood work) drawn today and your tests are completely normal, you will receive your results only by: Marland Kitchen MyChart Message (if you have MyChart) OR . A paper copy in the mail If you have any lab test that is abnormal or we need to change your treatment, we will call you to review the results.  Testing/Procedures: None.   Follow-Up: At [facility], you and your health needs are our priority.  As part of our continuing mission to provide you with exceptional heart care, we have created designated Provider Care Teams.  These Care Teams include your primary Cardiologist (physician) and Advanced Practice Providers (APPs -  Physician Assistants and Nurse Practitioners) who all work together to provide you with the care you need, when you need it. You will need a follow up appointment in 3 months.  Please call our office 2 months in advance to schedule this appointment.  You may see No primary care provider on file. or another member of our [employer] Provider Team in [place]: Shirlee More, MD . Jyl Heinz, MD  Any Other Special Instructions Will Be Listed Below (If Applicable).

## 2018-09-12 ENCOUNTER — Other Ambulatory Visit: Payer: Self-pay | Admitting: Lab

## 2018-09-12 ENCOUNTER — Other Ambulatory Visit: Payer: Self-pay | Admitting: *Deleted

## 2018-09-12 ENCOUNTER — Inpatient Hospital Stay: Payer: 59

## 2018-09-12 ENCOUNTER — Encounter: Payer: Self-pay | Admitting: Hematology & Oncology

## 2018-09-12 ENCOUNTER — Telehealth: Payer: Self-pay | Admitting: *Deleted

## 2018-09-12 ENCOUNTER — Telehealth: Payer: Self-pay | Admitting: Hematology & Oncology

## 2018-09-12 ENCOUNTER — Other Ambulatory Visit: Payer: Self-pay | Admitting: Hematology & Oncology

## 2018-09-12 DIAGNOSIS — C73 Malignant neoplasm of thyroid gland: Secondary | ICD-10-CM

## 2018-09-12 DIAGNOSIS — R079 Chest pain, unspecified: Secondary | ICD-10-CM | POA: Diagnosis not present

## 2018-09-12 DIAGNOSIS — I10 Essential (primary) hypertension: Secondary | ICD-10-CM | POA: Diagnosis not present

## 2018-09-12 DIAGNOSIS — C787 Secondary malignant neoplasm of liver and intrahepatic bile duct: Secondary | ICD-10-CM | POA: Diagnosis not present

## 2018-09-12 DIAGNOSIS — R52 Pain, unspecified: Secondary | ICD-10-CM | POA: Diagnosis not present

## 2018-09-12 LAB — CBC WITH DIFFERENTIAL (CANCER CENTER ONLY)
Abs Immature Granulocytes: 0.01 10*3/uL (ref 0.00–0.07)
Basophils Absolute: 0 10*3/uL (ref 0.0–0.1)
Basophils Relative: 1 %
Eosinophils Absolute: 0.1 10*3/uL (ref 0.0–0.5)
Eosinophils Relative: 1 %
HCT: 44.4 % (ref 39.0–52.0)
Hemoglobin: 15 g/dL (ref 13.0–17.0)
Immature Granulocytes: 0 %
Lymphocytes Relative: 25 %
Lymphs Abs: 1 10*3/uL (ref 0.7–4.0)
MCH: 29.4 pg (ref 26.0–34.0)
MCHC: 33.8 g/dL (ref 30.0–36.0)
MCV: 87.1 fL (ref 80.0–100.0)
Monocytes Absolute: 0.4 10*3/uL (ref 0.1–1.0)
Monocytes Relative: 9 %
Neutro Abs: 2.6 10*3/uL (ref 1.7–7.7)
Neutrophils Relative %: 64 %
Platelet Count: 179 10*3/uL (ref 150–400)
RBC: 5.1 MIL/uL (ref 4.22–5.81)
RDW: 12.9 % (ref 11.5–15.5)
WBC Count: 4 10*3/uL (ref 4.0–10.5)
nRBC: 0 % (ref 0.0–0.2)

## 2018-09-12 LAB — LACTATE DEHYDROGENASE: LDH: 221 U/L — ABNORMAL HIGH (ref 98–192)

## 2018-09-12 LAB — CMP (CANCER CENTER ONLY)
ALT: 21 U/L (ref 0–44)
AST: 21 U/L (ref 15–41)
Albumin: 4.1 g/dL (ref 3.5–5.0)
Alkaline Phosphatase: 104 U/L (ref 38–126)
Anion gap: 7 (ref 5–15)
BUN: 18 mg/dL (ref 6–20)
CO2: 30 mmol/L (ref 22–32)
Calcium: 8.8 mg/dL — ABNORMAL LOW (ref 8.9–10.3)
Chloride: 101 mmol/L (ref 98–111)
Creatinine: 0.95 mg/dL (ref 0.61–1.24)
GFR, Est AFR Am: >60 mL/min (ref >60)
GFR, Estimated: >60 mL/min (ref >60)
Glucose, Bld: 92 mg/dL (ref 70–99)
Potassium: 4.6 mmol/L (ref 3.5–5.1)
Sodium: 138 mmol/L (ref 135–145)
Total Bilirubin: 0.9 mg/dL (ref 0.3–1.2)
Total Protein: 6.4 g/dL — ABNORMAL LOW (ref 6.5–8.1)

## 2018-09-12 LAB — MAGNESIUM: Magnesium: 2.1 mg/dL (ref 1.7–2.4)

## 2018-09-12 MED ORDER — METHYLPREDNISOLONE 4 MG PO TBPK: ORAL_TABLET | ORAL | 0 refills | Status: DC

## 2018-09-12 MED ORDER — HYDRALAZINE HCL 50 MG PO TABS: 50.0000 mg | ORAL_TABLET | Freq: Three times a day (TID) | ORAL | 3 refills | Status: DC

## 2018-09-12 MED ORDER — CLONIDINE HCL 0.2 MG PO TABS: 0.2000 mg | ORAL_TABLET | Freq: Every day | ORAL | 0 refills | Status: DC

## 2018-09-12 NOTE — Telephone Encounter (Signed)
Call placed to patient regarding MyChart question this morning regarding upper lip swelling and concerns regarding Lisinopril.  Pt states that his upper lip is swollen and is wondering if it is caused by Lisinopril.  Pt also requests a lab appt for tomorrow.  Informed pt that I would speak with Dr. Marin Olp and call him back with MD orders.

## 2018-09-12 NOTE — Telephone Encounter (Signed)
Call received from patient stating that his bp is 181/126, his chest is hurting and that he hasn't had time to pick up his medrol dos pak yet.  Dr. Marin Olp notified and order for Hydralazine and two Clonidine's sent to [facility] per Dr. Marin Olp.  Pt states that he may admit himself through the ED, d/t he is at this moment.  Dr. Marin Olp notified and agrees with admission to the ED.  Pt then stated that he would get Hydralazine and Clonidine from pharmacy and take them now.

## 2018-09-12 NOTE — Telephone Encounter (Signed)
lmom to inform patient of lab only appt 7/28 at 1145 am per 7/27 sch msg

## 2018-09-12 NOTE — Telephone Encounter (Signed)
Call placed back to patient and patient notified per order of Dr. Marin Olp to stop Lisinopril, change Clonidine 0.2 mg daily to twice a day and that a medrol dos pak will be sent in to his pharmacy of choice.  Pt states that he prefers Medrol dos pak be sent to [facility].  Pt states that labs have already been done today.  Teach back done.  Pt appreciative of call back and has no further questions or concerns at this time.

## 2018-09-13 ENCOUNTER — Inpatient Hospital Stay: Payer: 59

## 2018-09-13 ENCOUNTER — Encounter: Payer: Self-pay | Admitting: Hematology & Oncology

## 2018-09-14 DIAGNOSIS — C799 Secondary malignant neoplasm of unspecified site: Secondary | ICD-10-CM | POA: Diagnosis not present

## 2018-09-14 DIAGNOSIS — C73 Malignant neoplasm of thyroid gland: Secondary | ICD-10-CM | POA: Diagnosis not present

## 2018-09-18 ENCOUNTER — Encounter: Payer: Self-pay | Admitting: Hematology & Oncology

## 2018-09-18 ENCOUNTER — Other Ambulatory Visit: Payer: Self-pay

## 2018-09-18 ENCOUNTER — Emergency Department (HOSPITAL_BASED_OUTPATIENT_CLINIC_OR_DEPARTMENT_OTHER): Payer: 59

## 2018-09-18 ENCOUNTER — Encounter (HOSPITAL_BASED_OUTPATIENT_CLINIC_OR_DEPARTMENT_OTHER): Payer: Self-pay | Admitting: Emergency Medicine

## 2018-09-18 ENCOUNTER — Emergency Department (HOSPITAL_BASED_OUTPATIENT_CLINIC_OR_DEPARTMENT_OTHER)
Admission: EM | Admit: 2018-09-18 | Discharge: 2018-09-18 | Disposition: A | Discharge status: Home / Self Care | Payer: 59 | Attending: Emergency Medicine | Admitting: Emergency Medicine

## 2018-09-18 DIAGNOSIS — R519 Headache, unspecified: Secondary | ICD-10-CM

## 2018-09-18 DIAGNOSIS — I10 Essential (primary) hypertension: Secondary | ICD-10-CM | POA: Insufficient documentation

## 2018-09-18 DIAGNOSIS — R0602 Shortness of breath: Secondary | ICD-10-CM

## 2018-09-18 DIAGNOSIS — Z79899 Other long term (current) drug therapy: Secondary | ICD-10-CM | POA: Diagnosis not present

## 2018-09-18 DIAGNOSIS — Z9221 Personal history of antineoplastic chemotherapy: Secondary | ICD-10-CM | POA: Insufficient documentation

## 2018-09-18 DIAGNOSIS — Z85850 Personal history of malignant neoplasm of thyroid: Secondary | ICD-10-CM | POA: Insufficient documentation

## 2018-09-18 DIAGNOSIS — Z20828 Contact with and (suspected) exposure to other viral communicable diseases: Secondary | ICD-10-CM | POA: Diagnosis not present

## 2018-09-18 DIAGNOSIS — R0789 Other chest pain: Secondary | ICD-10-CM | POA: Insufficient documentation

## 2018-09-18 DIAGNOSIS — I159 Secondary hypertension, unspecified: Secondary | ICD-10-CM | POA: Diagnosis not present

## 2018-09-18 DIAGNOSIS — R51 Headache: Secondary | ICD-10-CM | POA: Diagnosis not present

## 2018-09-18 LAB — HEPATIC FUNCTION PANEL
ALT: 21 U/L (ref 0–44)
AST: 21 U/L (ref 15–41)
Albumin: 3.9 g/dL (ref 3.5–5.0)
Alkaline Phosphatase: 112 U/L (ref 38–126)
Bilirubin, Direct: 0.2 mg/dL (ref 0.0–0.2)
Indirect Bilirubin: 0.3 mg/dL (ref 0.3–0.9)
Total Bilirubin: 0.5 mg/dL (ref 0.3–1.2)
Total Protein: 6.1 g/dL — ABNORMAL LOW (ref 6.5–8.1)

## 2018-09-18 LAB — CBC WITH DIFFERENTIAL/PLATELET
Abs Immature Granulocytes: 0.01 10*3/uL (ref 0.00–0.07)
Basophils Absolute: 0 10*3/uL (ref 0.0–0.1)
Basophils Relative: 1 %
Eosinophils Absolute: 0 10*3/uL (ref 0.0–0.5)
Eosinophils Relative: 1 %
HCT: 46.8 % (ref 39.0–52.0)
Hemoglobin: 15.6 g/dL (ref 13.0–17.0)
Immature Granulocytes: 0 %
Lymphocytes Relative: 33 %
Lymphs Abs: 1.2 10*3/uL (ref 0.7–4.0)
MCH: 29.1 pg (ref 26.0–34.0)
MCHC: 33.3 g/dL (ref 30.0–36.0)
MCV: 87.3 fL (ref 80.0–100.0)
Monocytes Absolute: 0.4 10*3/uL (ref 0.1–1.0)
Monocytes Relative: 12 %
Neutro Abs: 1.8 10*3/uL (ref 1.7–7.7)
Neutrophils Relative %: 53 %
Platelets: 230 10*3/uL (ref 150–400)
RBC: 5.36 MIL/uL (ref 4.22–5.81)
RDW: 13.2 % (ref 11.5–15.5)
WBC: 3.5 10*3/uL — ABNORMAL LOW (ref 4.0–10.5)
nRBC: 0 % (ref 0.0–0.2)

## 2018-09-18 LAB — BASIC METABOLIC PANEL
Anion gap: 10 (ref 5–15)
BUN: 17 mg/dL (ref 6–20)
CO2: 27 mmol/L (ref 22–32)
Calcium: 9.3 mg/dL (ref 8.9–10.3)
Chloride: 101 mmol/L (ref 98–111)
Creatinine, Ser: 1.02 mg/dL (ref 0.61–1.24)
GFR calc Af Amer: >60 mL/min (ref >60)
GFR calc non Af Amer: >60 mL/min (ref >60)
Glucose, Bld: 90 mg/dL (ref 70–99)
Potassium: 4.5 mmol/L (ref 3.5–5.1)
Sodium: 138 mmol/L (ref 135–145)

## 2018-09-18 LAB — URINALYSIS, ROUTINE W REFLEX MICROSCOPIC
Bilirubin Urine: NEGATIVE
Glucose, UA: NEGATIVE mg/dL
Hgb urine dipstick: NEGATIVE
Ketones, ur: NEGATIVE mg/dL
Leukocytes,Ua: NEGATIVE
Nitrite: NEGATIVE
Protein, ur: 100 mg/dL — AB
Specific Gravity, Urine: 1.01 (ref 1.005–1.030)
pH: 7 (ref 5.0–8.0)

## 2018-09-18 LAB — URINALYSIS, MICROSCOPIC (REFLEX)
Bacteria, UA: NONE SEEN
Squamous Epithelial / HPF: NONE SEEN (ref 0–5)

## 2018-09-18 LAB — SARS CORONAVIRUS 2 BY RT PCR (HOSPITAL ORDER, PERFORMED IN ~~LOC~~ HOSPITAL LAB): SARS Coronavirus 2: NEGATIVE

## 2018-09-18 LAB — TROPONIN I (HIGH SENSITIVITY): Troponin I (High Sensitivity): 5 ng/L (ref <18)

## 2018-09-18 MED ORDER — CLONIDINE HCL 0.1 MG PO TABS
0.2000 mg | ORAL_TABLET | Freq: Once | ORAL | Status: AC
  Administered 2018-09-18: 0.2 mg via ORAL
  Filled 2018-09-18: qty 2

## 2018-09-18 MED ORDER — IOHEXOL 350 MG/ML SOLN
100.0000 mL | Freq: Once | INTRAVENOUS | Status: AC
  Administered 2018-09-18: 100 mL via INTRAVENOUS

## 2018-09-18 MED ORDER — AMLODIPINE BESYLATE 10 MG PO TABS: 10.0000 mg | ORAL_TABLET | Freq: Every day | ORAL | 0 refills | Status: DC

## 2018-09-18 MED ORDER — AMLODIPINE BESYLATE 5 MG PO TABS
10.0000 mg | ORAL_TABLET | Freq: Once | ORAL | Status: AC
  Administered 2018-09-18: 10 mg via ORAL
  Filled 2018-09-18: qty 2

## 2018-09-18 MED ORDER — PROCHLORPERAZINE EDISYLATE 10 MG/2ML IJ SOLN
10.0000 mg | Freq: Once | INTRAMUSCULAR | Status: AC
  Administered 2018-09-18: 22:00:00 10 mg via INTRAVENOUS
  Filled 2018-09-18: qty 2

## 2018-09-18 NOTE — ED Notes (Signed)
Pt ambulated in room. SpO2 remained 98% and pt experienced no ShOB.

## 2018-09-18 NOTE — Discharge Instructions (Signed)
Have your primary care doctor follow-up your thyroid studies.  Take amlodipine daily as discussed.  Talk with your primary care doctor about further blood pressure control, possibly outpatient stress test.  Return to the ED if your symptoms worsen.

## 2018-09-18 NOTE — ED Triage Notes (Signed)
Shortness of breath, weakness, chest pressure yesterday. States elevated b/p als0

## 2018-09-18 NOTE — ED Provider Notes (Signed)
[facility] Provider Note   CSN: [id number] Arrival date & time: 09/18/18  2059    History   Chief Complaint Chief Complaint  Patient presents with  . Shortness of Breath  . Headache  . Chest Pain    HPI Brian [name] is a 43 y.o. male.     The history is provided by the patient.  Shortness of Breath Severity:  Mild Onset quality:  Gradual Timing:  Intermittent Progression:  Waxing and waning Chronicity:  Recurrent Context: activity   Relieved by:  Rest Worsened by:  Exertion Associated symptoms: chest pain (on and off for last several days, especially when BP is high, none currently) and headaches   Associated symptoms: no abdominal pain, no cough, no ear pain, no fever, no rash, no sore throat and no vomiting   Risk factors: hx of cancer (hx of thyroid cancer of chemotherapy)   Risk factors: no hx of PE/DVT     Past Medical History:  Diagnosis Date  . Essential hypertension 09/09/2018  . Kidney stone   . 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 Rickettsia infection     Patient Active Problem List   Diagnosis Date Noted  . Essential hypertension 09/09/2018  . 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 Extrasystole 04/26/2018  . Dyspnea on exertion 04/26/2018  . Atypical chest pain 04/26/2018  . Ureteral stone with hydronephrosis 02/15/2018  . Right thyroid nodule 04/09/2017  . Bilateral knee pain 07/01/2016  . Left foot pain 02/06/2016  . Gastroesophageal reflux disease without esophagitis 05/08/2014  . Thrombocytopenia, unspecified ([place]) 06/13/2013  . 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 Low back pain 05/12/2012  . Allergic rhinitis 06/03/2009    Past Surgical History:  Procedure Laterality Date  . BIOPSY  04/05/2018   Procedure: BIOPSY;  Surgeon: Ronald Lobo, MD;  Location: [facility];  Service: Endoscopy;;  . CHOLECYSTECTOMY    .  ESOPHAGOGASTRODUODENOSCOPY (EGD) WITH PROPOFOL N/A 04/05/2018   Procedure: ESOPHAGOGASTRODUODENOSCOPY (EGD) WITH PROPOFOL;  Surgeon: Ronald Lobo, MD;  Location: [facility];  Service: Endoscopy;  Laterality: N/A;        Home Medications    Prior to Admission medications   Medication Sig Start Date End Date Taking? Authorizing Provider  amLODipine (NORVASC) 10 MG tablet Take 1 tablet (10 mg total) by mouth daily. 09/18/18 10/18/18  Jarren Para, DO  cloNIDine (CATAPRES) 0.2 MG tablet Take 1 tablet (0.2 mg total) by mouth daily. 09/12/18   [name], MD  EPINEPHrine (EPIPEN 2-PAK) 0.3 mg/0.3 mL IJ SOAJ injection USE AS DIRECTED FOR LIFE THREATENING ALLERGIC REACTIONS Patient taking differently: Inject 0.3 mg into the muscle as needed for anaphylaxis.  04/30/15   Kozlow, Donnamarie Poag, MD  hydrALAZINE (APRESOLINE) 50 MG tablet Take 1 tablet (50 mg total) by mouth 3 (three) times daily. 09/12/18   [name], MD  lenvatinib 24 mg daily dose (LENVIMA) 2 x 10 MG & 4 MG capsule Take 20 mg by mouth daily. 08/25/18 09/24/18  [provider]  levothyroxine (SYNTHROID) 200 MCG tablet Take by mouth. 06/08/18   [provider]  lisinopril (ZESTRIL) 20 MG tablet Take 20 mg by mouth daily. 08/25/18 08/25/19  [provider]  methylPREDNISolone (MEDROL DOSEPAK) 4 MG TBPK tablet Medrol dos pak-take as directed 09/12/18   [name], MD  ondansetron (ZOFRAN) 8 MG tablet Take 8 mg by mouth daily. 07/29/18   [provider]  oxyCODONE (OXY IR/ROXICODONE) 5 MG immediate release tablet Take 1 tablet (5 mg total) by mouth every 6 (six) hours as needed for severe pain. 09/05/18   [name], MD  predniSONE (DELTASONE) 50 MG tablet Take one tablet 13h, 7h and 1h before scan. 06/09/18   [name], MD  promethazine (PHENERGAN) 12.5 MG tablet Take 1 tablet (12.5 mg total) by mouth at bedtime. 09/05/18   [name], MD    Family History Family History  Problem  Relation Age of Onset  . Diabetes Mother   . Heart disease Mother   . Heart failure Mother   . Heart attack Mother   . Hyperlipidemia Mother   . Hypertension Mother   . Hyperlipidemia Father   . Allergic rhinitis Father   . Rashes / Skin problems Father   . Sudden death Neg Hx   . Thyroid disease Neg Hx     Social History Social History   Tobacco Use  . Smoking status: Never Smoker  . Smokeless tobacco: Never Used  Substance Use Topics  . Alcohol use: No  . Drug use: No     Allergies   Doxycycline, Guaifenesin, Ibuprofen, Pantoprazole, Tramadol, Dextromethorphan-guaifenesin, Gadobutrol, Gadolinium derivatives, Guaifenesin & derivatives, and Lisinopril   Review of Systems Review of Systems  Constitutional: Negative for chills and fever.  HENT: Negative for ear pain and sore throat.   Eyes: Negative for pain and visual disturbance.  Respiratory: Positive for shortness of breath. Negative for cough.   Cardiovascular: Positive for chest pain (on and off for last several days, especially when BP is high, none currently). Negative for palpitations.  Gastrointestinal: Negative for abdominal pain and vomiting.  Genitourinary: Negative for dysuria and hematuria.  Musculoskeletal: Negative for arthralgias and back pain.  Skin: Negative for color change and rash.  Neurological: Positive for weakness (generalized ) and headaches. Negative for seizures and syncope.  All other systems reviewed and are negative.    Physical Exam Updated Vital Signs  ED Triage Vitals  Enc Vitals Group     BP 09/18/18 2106 (!) 170/125     Pulse Rate 09/18/18 2106 77     Resp 09/18/18 2106 (!) 22     Temp 09/18/18 2106 98.3 F (36.8 C)     Temp Source 09/18/18 2106 Oral     SpO2 09/18/18 2106 99 %     Weight 09/18/18 2107 253 lb (114.8 kg)     Height 09/18/18 2107 5\' 10"  (1.778 m)     Head Circumference --      Peak Flow --      Pain Score 09/18/18 2107 5     Pain Loc --      Pain Edu?  --      Excl. in [place]? --     Physical Exam Vitals signs and nursing note reviewed.  Constitutional:      General: He is not in acute distress.    Appearance: He is well-developed. He is not ill-appearing.  HENT:     Head: Normocephalic and atraumatic.     Mouth/Throat:     Mouth: Mucous membranes are moist.     Pharynx: Oropharynx is clear.  Eyes:     Extraocular Movements: Extraocular movements intact.     Conjunctiva/sclera: Conjunctivae normal.     Pupils: Pupils are equal, round, and reactive to light.  Neck:     Musculoskeletal: Normal range of motion and neck supple.  Cardiovascular:  Rate and Rhythm: Normal rate and regular rhythm.     Pulses: Normal pulses.     Heart sounds: Normal heart sounds. No murmur.  Pulmonary:     Effort: Pulmonary effort is normal. No respiratory distress.     Breath sounds: Normal breath sounds. No decreased breath sounds, wheezing, rhonchi or rales.  Abdominal:     Palpations: Abdomen is soft.     Tenderness: There is no abdominal tenderness.  Musculoskeletal: Normal range of motion.     Right lower leg: No edema.     Left lower leg: No edema.  Skin:    General: Skin is warm and dry.     Capillary Refill: Capillary refill takes less than 2 seconds.  Neurological:     General: No focal deficit present.     Mental Status: He is alert.     Cranial Nerves: No cranial nerve deficit.     Motor: No weakness.     Comments: 5+ out of 5 strength, normal sensation, normal finger-to-nose finger, no drift, normal speech  Psychiatric:        Mood and Affect: Mood normal.      ED Treatments / Results  Labs (all labs ordered are listed, but only abnormal results are displayed) Labs Reviewed  CBC WITH DIFFERENTIAL/PLATELET - Abnormal; Notable for the following components:      Result Value   WBC 3.5 (*)    All other components within normal limits  HEPATIC FUNCTION PANEL - Abnormal; Notable for the following components:   Total Protein 6.1  (*)    All other components within normal limits  URINALYSIS, ROUTINE W REFLEX MICROSCOPIC - Abnormal; Notable for the following components:   Protein, ur 100 (*)    All other components within normal limits  SARS CORONAVIRUS 2 (HOSPITAL ORDER, [facility])  BASIC METABOLIC PANEL  URINALYSIS, MICROSCOPIC (REFLEX)  TSH  T4, FREE  TROPONIN I (HIGH SENSITIVITY)    EKG EKG Interpretation  Date/Time:  Sunday September 18 2018 21:08:46 EDT Ventricular Rate:  77 PR Interval:    QRS Duration: 91 QT Interval:  382 QTC Calculation: 433 R Axis:   -9 Text Interpretation:  Sinus rhythm M Baseline wander in lead(s) V5 V6 Confirmed by Lennice Sites [phone]) on 09/18/2018 9:18:18 PM   Radiology Ct Head Wo Contrast  Result Date: 09/18/2018 CLINICAL DATA:  Acute onset headache EXAM: CT HEAD WITHOUT CONTRAST TECHNIQUE: Contiguous axial images were obtained from the base of the skull through the vertex without intravenous contrast. COMPARISON:  Head CT 06/11/2013 FINDINGS: Brain: There is no mass, hemorrhage or extra-axial collection. The size and configuration of the ventricles and extra-axial CSF spaces are normal. The brain parenchyma is normal, without acute or chronic infarction. Vascular: No abnormal hyperdensity of the major intracranial arteries or dural venous sinuses. No intracranial atherosclerosis. Skull: The visualized skull base, calvarium and extracranial soft tissues are normal. Sinuses/Orbits: No fluid levels or advanced mucosal thickening of the visualized paranasal sinuses. No mastoid or middle ear effusion. The orbits are normal. IMPRESSION: Normal head CT. Electronically Signed   By: Ulyses Jarred M.D.   On: 09/18/2018 23:03   Ct Angio Chest Pe W And/or Wo Contrast  Result Date: 09/18/2018 CLINICAL DATA:  Chest pain and shortness of breath EXAM: CT ANGIOGRAPHY CHEST WITH CONTRAST TECHNIQUE: Multidetector CT imaging of the chest was performed using the standard  protocol during bolus administration of intravenous contrast. Multiplanar CT image reconstructions and MIPs were obtained  to evaluate the vascular anatomy. CONTRAST:  130mL OMNIPAQUE IOHEXOL 350 MG/ML SOLN COMPARISON:  09/04/2018 CTA chest FINDINGS: Cardiovascular: --Pulmonary arteries: Contrast injection is sufficient to demonstrate satisfactory opacification of the pulmonary arteries to the segmental level, with attenuation of at least 200 HU at the main pulmonary artery. There is no pulmonary embolus. The main pulmonary artery is within normal limits for size. --Aorta: Limited opacification of the aorta due to bolus timing optimization for the pulmonary arteries. The course and caliber of the aorta are normal. Conventional 3 vessel aortic branching pattern. There is no aortic atherosclerosis. --Heart: Normal size. No pericardial effusion. Mediastinum/Nodes: Right paratracheal lymph node measures 11 mm, unchanged compared to 09/04/2018. The visualized thyroid and thoracic esophageal course are unremarkable. Lungs/Pleura: No pulmonary nodules or masses. No pleural effusion or pneumothorax. No focal airspace consolidation. No focal pleural abnormality. Upper Abdomen: Contrast bolus timing is not optimized for evaluation of the abdominal organs. The visualized portions of the organs of the upper abdomen are normal. Musculoskeletal: No chest wall abnormality. No bony spinal canal stenosis. Review of the MIP images confirms the above findings. IMPRESSION: 1. No pulmonary embolus or other acute thoracic abnormality. 2. Unchanged 11 mm right paratracheal lymph node. Electronically Signed   By: Ulyses Jarred M.D.   On: 09/18/2018 23:00    Procedures Procedures (including critical care time)  Medications Ordered in ED Medications  amLODipine (NORVASC) tablet 10 mg (has no administration in time range)  cloNIDine (CATAPRES) tablet 0.2 mg (0.2 mg Oral Given 09/18/18 2147)  prochlorperazine (COMPAZINE) injection 10 mg  (10 mg Intravenous Given 09/18/18 2149)  iohexol (OMNIPAQUE) 350 MG/ML injection 100 mL (100 mLs Intravenous Contrast Given 09/18/18 2227)     Initial Impression / Assessment and Plan / ED Course  I have reviewed the triage vital signs and the nursing notes.  Pertinent labs & imaging results that were available during my care of the patient were reviewed by me and considered in my medical decision making (see chart for details).     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  Patient has had the symptoms ongoing for several weeks but got worse today.  Does not have a fever, no cough, no sputum production.  States on his blood pressure gets high he gets more symptomatic.  Recently he has had changes in his blood pressure medications.  Patient overall does not have any chest pain now.  Has mild headache.  Has mostly just shortness of breath with exertion.  EKG shows sinus rhythm.  No ischemic changes.  No other specific cardiac risk factors.  Overall atypical story for ACS.  Troponin normal.  Doubt ACS.  Patient is high risk for PE and will get a CT scan of his chest for further evaluation.  Also will get a head CT to evaluate for any signs of metastasis there.  Will give home dose of clonidine for blood pressure and Compazine for headache.  Patient also to get basic labs.  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  Patient with white count of 3.5 but otherwise no significant anemia, electrolyte abnormality, kidney injury.  Troponin is normal.  Blood pressure has improved with clonidine.  Patient had improvement following Compazine and clonidine.  CT scan of his chest showed no  PE, no pneumonia.  No new findings.  No aortic calcifications.  Patient  had head CT that showed no significant findings as well.  Overall patient does not have active chest pain or have anginal equivalent chest pain.  Doubt ACS.  Patient does work in an emergency department and will test him for coronavirus as this could be a cause of some of the symptoms today.  Overall patient has multiple chronic illnesses which could be contributing to his on and off feelings of poor health.  No concern for stroke.  Recommend rest and hydration.  We will contact him about his coronavirus test and patient was discharged in the ED in good condition.  He understands return precautions.  Will prescribe amlodipine for further blood pressure control.  First dose is given in the ED. Will have PCP follow up thyroid studies   This chart was dictated using voice recognition software.  Despite best efforts to proofread,  errors can occur which can change the documentation meaning.    Final Clinical Impressions(s) / ED Diagnoses   Final diagnoses:  Secondary hypertension  Atypical chest pain  Nonintractable headache, unspecified chronicity pattern, unspecified headache type  SOB (shortness of breath)    ED Discharge Orders         Ordered    amLODipine (NORVASC) 10 MG tablet  Daily     09/18/18 2320           Lennice Sites, DO 09/18/18 2320

## 2018-09-18 NOTE — ED Notes (Signed)
Pt on monitor 

## 2018-09-19 ENCOUNTER — Other Ambulatory Visit: Payer: Self-pay | Admitting: *Deleted

## 2018-09-19 ENCOUNTER — Telehealth: Payer: Self-pay | Admitting: *Deleted

## 2018-09-19 ENCOUNTER — Encounter: Payer: Self-pay | Admitting: Hematology & Oncology

## 2018-09-19 DIAGNOSIS — C73 Malignant neoplasm of thyroid gland: Secondary | ICD-10-CM

## 2018-09-19 LAB — T4, FREE: Free T4: 1.31 ng/dL — ABNORMAL HIGH (ref 0.61–1.12)

## 2018-09-19 LAB — TSH: TSH: 2.472 u[IU]/mL (ref 0.350–4.500)

## 2018-09-19 MED ORDER — CLONIDINE HCL 0.2 MG PO TABS: 0.2000 mg | ORAL_TABLET | Freq: Two times a day (BID) | ORAL | 2 refills | Status: DC

## 2018-09-19 NOTE — Telephone Encounter (Signed)
Pt returned call gave list of medications he is taking for BP. Med list updated.Pt denied any concerns at this time.

## 2018-09-23 ENCOUNTER — Other Ambulatory Visit: Payer: Self-pay

## 2018-09-23 ENCOUNTER — Encounter: Payer: Self-pay | Admitting: Hematology & Oncology

## 2018-09-23 ENCOUNTER — Other Ambulatory Visit: Payer: Self-pay | Admitting: *Deleted

## 2018-09-23 ENCOUNTER — Ambulatory Visit (HOSPITAL_COMMUNITY)
Admission: RE | Admit: 2018-09-23 | Discharge: 2018-09-23 | Disposition: A | Discharge status: Home / Self Care | Payer: 59 | Source: Ambulatory Visit | Attending: Hematology & Oncology | Admitting: Hematology & Oncology

## 2018-09-23 DIAGNOSIS — C342 Malignant neoplasm of middle lobe, bronchus or lung: Secondary | ICD-10-CM

## 2018-09-23 DIAGNOSIS — C73 Malignant neoplasm of thyroid gland: Secondary | ICD-10-CM | POA: Diagnosis not present

## 2018-09-23 LAB — GLUCOSE, CAPILLARY: Glucose-Capillary: 78 mg/dL (ref 70–99)

## 2018-09-23 MED ORDER — FLUDEOXYGLUCOSE F - 18 (FDG) INJECTION
13.8000 | Freq: Once | INTRAVENOUS | Status: AC | PRN
  Administered 2018-09-23: 13.8 via INTRAVENOUS

## 2018-09-23 MED ORDER — PREDNISONE 50 MG PO TABS: ORAL_TABLET | ORAL | 3 refills | Status: DC

## 2018-09-25 ENCOUNTER — Ambulatory Visit (HOSPITAL_COMMUNITY)
Admission: RE | Admit: 2018-09-25 | Discharge: 2018-09-25 | Disposition: A | Discharge status: Home / Self Care | Payer: 59 | Source: Ambulatory Visit | Attending: Family | Admitting: Family

## 2018-09-25 DIAGNOSIS — C73 Malignant neoplasm of thyroid gland: Secondary | ICD-10-CM | POA: Diagnosis not present

## 2018-09-25 MED ORDER — GADOBUTROL 1 MMOL/ML IV SOLN
10.0000 mL | Freq: Once | INTRAVENOUS | Status: AC | PRN
  Administered 2018-09-25: 16:00:00 10 mL via INTRAVENOUS

## 2018-09-26 ENCOUNTER — Telehealth: Payer: Self-pay | Admitting: *Deleted

## 2018-09-26 ENCOUNTER — Ambulatory Visit (HOSPITAL_COMMUNITY): Payer: 59

## 2018-09-26 NOTE — Telephone Encounter (Signed)
Message received from patient requesting PET scan and MRI results.  Call placed back to patient and patient notified per order of Dr. Marin Olp that scans are "stable" and that "the liver lesion may by slightly larger."  Pt appreciative of call back and has no further questions or concerns at this time.

## 2018-09-29 ENCOUNTER — Other Ambulatory Visit: Payer: Self-pay | Admitting: Hematology & Oncology

## 2018-09-29 ENCOUNTER — Ambulatory Visit (HOSPITAL_COMMUNITY): Payer: 59

## 2018-09-29 DIAGNOSIS — C73 Malignant neoplasm of thyroid gland: Secondary | ICD-10-CM

## 2018-10-03 ENCOUNTER — Ambulatory Visit: Payer: 59 | Admitting: Hematology & Oncology

## 2018-10-03 ENCOUNTER — Other Ambulatory Visit: Payer: 59

## 2018-10-04 ENCOUNTER — Other Ambulatory Visit: Payer: Self-pay | Admitting: Family

## 2018-10-04 DIAGNOSIS — C73 Malignant neoplasm of thyroid gland: Secondary | ICD-10-CM

## 2018-10-06 DIAGNOSIS — C799 Secondary malignant neoplasm of unspecified site: Secondary | ICD-10-CM | POA: Diagnosis not present

## 2018-10-06 DIAGNOSIS — C787 Secondary malignant neoplasm of liver and intrahepatic bile duct: Secondary | ICD-10-CM | POA: Diagnosis not present

## 2018-10-06 DIAGNOSIS — C73 Malignant neoplasm of thyroid gland: Secondary | ICD-10-CM | POA: Diagnosis not present

## 2018-10-07 ENCOUNTER — Inpatient Hospital Stay: Payer: 59 | Attending: Hematology & Oncology

## 2018-10-07 ENCOUNTER — Encounter: Payer: Self-pay | Admitting: Hematology & Oncology

## 2018-10-07 ENCOUNTER — Other Ambulatory Visit: Payer: Self-pay | Admitting: *Deleted

## 2018-10-07 ENCOUNTER — Inpatient Hospital Stay (HOSPITAL_BASED_OUTPATIENT_CLINIC_OR_DEPARTMENT_OTHER): Payer: 59 | Admitting: Hematology & Oncology

## 2018-10-07 ENCOUNTER — Telehealth: Payer: Self-pay | Admitting: Hematology & Oncology

## 2018-10-07 ENCOUNTER — Other Ambulatory Visit: Payer: Self-pay

## 2018-10-07 VITALS — BP 137/91 | HR 65 | Temp 97.1°F | Resp 19 | Ht 70.0 in | Wt 258.4 lb

## 2018-10-07 DIAGNOSIS — C73 Malignant neoplasm of thyroid gland: Secondary | ICD-10-CM | POA: Insufficient documentation

## 2018-10-07 DIAGNOSIS — M130 Polyarthritis, unspecified: Secondary | ICD-10-CM

## 2018-10-07 DIAGNOSIS — Z79899 Other long term (current) drug therapy: Secondary | ICD-10-CM | POA: Insufficient documentation

## 2018-10-07 DIAGNOSIS — C787 Secondary malignant neoplasm of liver and intrahepatic bile duct: Secondary | ICD-10-CM | POA: Diagnosis not present

## 2018-10-07 LAB — CBC WITH DIFFERENTIAL (CANCER CENTER ONLY)
Abs Immature Granulocytes: 0.01 10*3/uL (ref 0.00–0.07)
Basophils Absolute: 0 10*3/uL (ref 0.0–0.1)
Basophils Relative: 0 %
Eosinophils Absolute: 0 10*3/uL (ref 0.0–0.5)
Eosinophils Relative: 1 %
HCT: 38.2 % — ABNORMAL LOW (ref 39.0–52.0)
Hemoglobin: 13 g/dL (ref 13.0–17.0)
Immature Granulocytes: 0 %
Lymphocytes Relative: 24 %
Lymphs Abs: 0.8 10*3/uL (ref 0.7–4.0)
MCH: 29.3 pg (ref 26.0–34.0)
MCHC: 34 g/dL (ref 30.0–36.0)
MCV: 86 fL (ref 80.0–100.0)
Monocytes Absolute: 0.3 10*3/uL (ref 0.1–1.0)
Monocytes Relative: 9 %
Neutro Abs: 2.1 10*3/uL (ref 1.7–7.7)
Neutrophils Relative %: 66 %
Platelet Count: 96 10*3/uL — ABNORMAL LOW (ref 150–400)
RBC: 4.44 MIL/uL (ref 4.22–5.81)
RDW: 13.3 % (ref 11.5–15.5)
WBC Count: 3.2 10*3/uL — ABNORMAL LOW (ref 4.0–10.5)
nRBC: 0 % (ref 0.0–0.2)

## 2018-10-07 LAB — URINALYSIS, COMPLETE (UACMP) WITH MICROSCOPIC
Bilirubin Urine: NEGATIVE
Glucose, UA: NEGATIVE mg/dL
Hgb urine dipstick: NEGATIVE
Ketones, ur: 15 mg/dL — AB
Leukocytes,Ua: NEGATIVE
Nitrite: NEGATIVE
Protein, ur: 100 mg/dL — AB
Specific Gravity, Urine: >1.03 — ABNORMAL HIGH (ref 1.005–1.030)
WBC, UA: NONE SEEN WBC/hpf (ref 0–5)
pH: 5.5 (ref 5.0–8.0)

## 2018-10-07 LAB — CMP (CANCER CENTER ONLY)
ALT: 24 U/L (ref 0–44)
AST: 26 U/L (ref 15–41)
Albumin: 3.7 g/dL (ref 3.5–5.0)
Alkaline Phosphatase: 72 U/L (ref 38–126)
Anion gap: 6 (ref 5–15)
BUN: 41 mg/dL — ABNORMAL HIGH (ref 6–20)
CO2: 26 mmol/L (ref 22–32)
Calcium: 8.7 mg/dL — ABNORMAL LOW (ref 8.9–10.3)
Chloride: 106 mmol/L (ref 98–111)
Creatinine: 1.17 mg/dL (ref 0.61–1.24)
GFR, Est AFR Am: >60 mL/min (ref >60)
GFR, Estimated: >60 mL/min (ref >60)
Glucose, Bld: 93 mg/dL (ref 70–99)
Potassium: 5.7 mmol/L — ABNORMAL HIGH (ref 3.5–5.1)
Sodium: 138 mmol/L (ref 135–145)
Total Bilirubin: 0.8 mg/dL (ref 0.3–1.2)
Total Protein: 5.8 g/dL — ABNORMAL LOW (ref 6.5–8.1)

## 2018-10-07 NOTE — Progress Notes (Signed)
Hematology and Oncology Follow Up Visit  KEY CEN [id number] 11-29-1975 43 y.o. 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 is back for for follow-up.  He is doing a little better.  His blood pressure seems to be under better control which is nice to see.  He did have scans done.  It looks like everything is holding pretty steady.  He had the PET scan done on 09/23/2018.  It looks like there was resolution of adenopathy in the right neck.  There was decrease in the right paratracheal lymph node activity.  There was some increase in the activity in size of a right hepatic lobe met.  On the liver MRI, I think the lesion in question measures 2.3 x 1.1 cm.  I talked with interventional radiology.  They will try to consider him for RFA therapy.  I think this would be reasonable.  He does see his oncologist out at [facility].  He does I think telephone visits.  She has yet to look at the scans.  Apparently there is some issue with getting the scans out there to be seen.  He is still working.  He has had no bleeding.  He has had no cough.  He has had no shortness of breath.  There is been no change in bowel or bladder habits.   His labs look pretty good.  His potassium is up a little bit.  I am unsure as to why this would be the case.  His creatinine is 1.17.  His white cell count is 3.2.  Hemoglobin 13 and platelet count 96,000.  He seems to be doing okay with the [place].  There is been no leg swelling.  He has had no rashes.  There is been no headache.  Overall, his performance status is ECOG 1.    Medications:  Current Outpatient Medications:  .  amLODipine (NORVASC) 10 MG tablet, Take 1 tablet (10 mg total) by mouth daily., Disp: 30 tablet, Rfl: 0 .  cloNIDine (CATAPRES) 0.2 MG tablet, Take 1 tablet (0.2 mg total) by mouth 2  (two) times daily., Disp: 60 tablet, Rfl: 2 .  levothyroxine (SYNTHROID) 200 MCG tablet, Take by mouth., Disp: , Rfl:  .  ondansetron (ZOFRAN) 8 MG tablet, Take 8 mg by mouth daily., Disp: , Rfl:  .  oxyCODONE (OXY IR/ROXICODONE) 5 MG immediate release tablet, Take 1 tablet (5 mg total) by mouth every 6 (six) hours as needed for severe pain., Disp: 60 tablet, Rfl: 0 .  promethazine (PHENERGAN) 12.5 MG tablet, Take 1 tablet (12.5 mg total) by mouth at bedtime., Disp: 60 tablet, Rfl: 3 .  EPINEPHrine (EPIPEN 2-PAK) 0.3 mg/0.3 mL IJ SOAJ injection, USE AS DIRECTED FOR LIFE THREATENING ALLERGIC REACTIONS (Patient not taking: Reported on 10/07/2018), Disp: 4 Device, Rfl: 3 .  predniSONE (DELTASONE) 50 MG tablet, Take one tablet 13h, 7h and 1h before scan. (Patient not taking: Reported on 10/07/2018), Disp: 3 tablet, Rfl: 3  Allergies:  Allergies  Allergen Reactions  . Dextromethorphan-Guaifenesin Other (See Comments)    Irregular heartbeat   . Doxycycline Anaphylaxis, Nausea And Vomiting, Rash and Other (See Comments)    "heart arrythmia" and "dyspepsia" (only oral doxycycline causes reaction)  . Gadobutrol Hives and Other (See Comments)    Patient had MRI  scan at [place]. Patient called one hour after he left imaging facility to report two "blisters" that came up on "back" of lip.    . Gadolinium Derivatives Hives and Other (See Comments)    Patient had MRI scan at [facility]. Patient called one hour after he left imaging facility to report two "blisters" that came up on "back" of lip.   . Guaifenesin Palpitations       . Ibuprofen Itching  . Pantoprazole Itching  . Tramadol Hives and Itching  . Lisinopril     Past Medical History, Surgical history, Social history, and Family History were reviewed and updated.  Review of Systems: Review of Systems  Constitutional: Negative.   HENT:   Positive for lump/mass and sore throat.   Eyes: Negative.   Respiratory: Negative.    Cardiovascular: Negative.   Gastrointestinal: Negative.   Endocrine: Negative.   Genitourinary: Negative.    Musculoskeletal: Negative.   Skin: Negative.   Neurological: Negative.   Hematological: Negative.   Psychiatric/Behavioral: Negative.     Physical Exam:  height is 5' 10"  (1.778 m) and weight is 258 lb 6.4 oz (117.2 kg). His temporal temperature is 97.1 F (36.2 C) (abnormal). His blood pressure is 137/91 (abnormal) and his pulse is 65. His respiration is 19 and oxygen saturation is 100%.   Wt Readings from Last 3 Encounters:  10/07/18 258 lb 6.4 oz (117.2 kg)  09/18/18 253 lb (114.8 kg)  09/04/18 253 lb (114.8 kg)    Physical Exam Vitals signs reviewed.  HENT:     Head: Normocephalic and atraumatic.  Eyes:     Pupils: Pupils are equal, round, and reactive to light.  Neck:     Musculoskeletal: Normal range of motion.     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  This is well-healing.  There is some slight fullness but no distinct mass noted in the neck. Cardiovascular:     Rate and Rhythm: Normal rate and regular rhythm.     Heart sounds: Normal heart sounds.  Pulmonary:     Effort: Pulmonary effort is normal.     Breath sounds: Normal breath sounds.  Abdominal:     General: Bowel sounds are normal.     Palpations: Abdomen is soft.  Musculoskeletal: Normal range of motion.        General: No tenderness or deformity.  Lymphadenopathy:     Cervical: No cervical adenopathy.  Skin:    General: Skin is warm and dry.     Findings: No erythema or rash.  Neurological:     Mental Status: He is alert and oriented to person, place, and time.  Psychiatric:        Behavior: Behavior normal.        Thought Content: Thought content normal.        Judgment: Judgment normal.      Lab Results  Component Value Date   WBC 3.2 (L) 10/07/2018   HGB 13.0 10/07/2018   HCT 38.2 (L) 10/07/2018   MCV 86.0 10/07/2018   PLT 96  (L) 10/07/2018     Chemistry      Component Value Date/Time   NA 138 10/07/2018 1103   K 5.7 (H) 10/07/2018 1103   CL 106 10/07/2018 1103   CO2 26 10/07/2018 1103   BUN 41 (H) 10/07/2018 1103   CREATININE 1.17 10/07/2018 1103      Component Value Date/Time  CALCIUM 8.7 (L) 10/07/2018 1103   ALKPHOS 72 10/07/2018 1103   AST 26 10/07/2018 1103   ALT 24 10/07/2018 1103   BILITOT 0.8 10/07/2018 1103      Impression and Plan: [name] is a 43 year old white male.  He has a incredibly unusual metastatic Hurthle cell tumor of the thyroid.  He is being followed by the endocrine malignancy clinic at [facility].  He would like to have a lot of his follow-up in [place] because of the distance that it takes to go to [place].  We will be more than happy to follow him in our office in conjunction with the oncologists at [facility].  Overall, I have to believe that everything is holding pretty stable.  He is doing well on the [facility] which is a plus.  Hopefully, interventional radiology will be able to do an intrahepatic procedure on him for the liver lesions.  I would want repeat his labs in about a week.  I want to make sure his platelets and his potassium are okay.  He is still working down to the ER.  I have to give him a lot of credit for this.  We will plan to get him back to see [place] in another few weeks.Marland Kitchen   [name], MD 8/21/202012:43 PM

## 2018-10-07 NOTE — Telephone Encounter (Signed)
Spoke with patient to confirm 9/18 appt at 1130 per 8/20 LOS

## 2018-10-09 LAB — URINE CULTURE: Culture: NO GROWTH

## 2018-10-10 ENCOUNTER — Telehealth: Payer: Self-pay | Admitting: Hematology & Oncology

## 2018-10-10 LAB — LACTATE DEHYDROGENASE: LDH: 186 U/L (ref 98–192)

## 2018-10-10 NOTE — Telephone Encounter (Signed)
lmom to inform patient of Lab only appt 8/27 at 1145 per 8/24 sch msg

## 2018-10-11 ENCOUNTER — Other Ambulatory Visit: Payer: Self-pay

## 2018-10-11 ENCOUNTER — Encounter: Payer: Self-pay | Admitting: *Deleted

## 2018-10-11 ENCOUNTER — Ambulatory Visit
Admission: RE | Admit: 2018-10-11 | Discharge: 2018-10-11 | Disposition: A | Discharge status: Home / Self Care | Payer: 59 | Source: Ambulatory Visit | Attending: Family | Admitting: Family

## 2018-10-11 DIAGNOSIS — C73 Malignant neoplasm of thyroid gland: Secondary | ICD-10-CM | POA: Diagnosis not present

## 2018-10-11 HISTORY — PX: IR RADIOLOGIST EVAL & MGMT: IMG5224

## 2018-10-11 NOTE — Consult Note (Signed)
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 male with history of nephrolithiasis, hypertension and metastatic thyroid cancer who is seen in consultation via telemedicine for evaluation of hepatic directed therapy for liver lesion.  The patient was initially diagnosed with thyroid cancer earlier this year, post completion of thyroidectomy and lymph node dissection performed on 06/15/2018 at [place].  Patient has since been undergoing treatment both as coordinated by Dr. Marin Olp as well as a [facility] endocrine oncologist.  PET/CT performed 09/23/2018 demonstrates significant improvement in metastatic disease however previously noted hypermetabolic liver lesion was felt to increase in both size and hypermetabolic activity.    Subsequent abdominal MRI performed 09/25/2018 demonstrated grossly unchanged size of the hepatic lesion compared to prior abdominal MRI performed 06/11/2018, with potential development of 2 subcentimeter areas of restricted diffusion adjacent to the dominant though now centrally necrotic liver lesion.  Patient is in his baseline state of well health tolerating current regimen of Lenvima without incident.   He is interested in pursuing all available treatment options.  Past Medical History:  Diagnosis Date   Essential hypertension 09/09/2018   Kidney stone    Primary cancer of thyroid with metastasis to other site [facility]) 05/04/2018   Rickettsia infection     Past Surgical History:  Procedure Laterality Date   BIOPSY  04/05/2018   Procedure: BIOPSY;  Surgeon: Ronald Lobo, MD;  Location: [facility];  Service: Endoscopy;;   CHOLECYSTECTOMY     ESOPHAGOGASTRODUODENOSCOPY (EGD) WITH PROPOFOL N/A 04/05/2018   Procedure: ESOPHAGOGASTRODUODENOSCOPY (EGD) WITH PROPOFOL;  Surgeon: Ronald Lobo, MD;  Location: [facility];  Service:  Endoscopy;  Laterality: N/A;    Allergies: Dextromethorphan-guaifenesin, Doxycycline, Gadobutrol, Gadolinium derivatives, Guaifenesin, Ibuprofen, Pantoprazole, Tramadol, and Lisinopril  Medications: Prior to Admission medications   Medication Sig Start Date End Date Taking? Authorizing Provider  amLODipine (NORVASC) 10 MG tablet Take 1 tablet (10 mg total) by mouth daily. 09/18/18 10/18/18  Curatolo, Adam, DO  cloNIDine (CATAPRES) 0.2 MG tablet Take 1 tablet (0.2 mg total) by mouth 2 (two) times daily. 09/19/18   [name], MD  EPINEPHrine (EPIPEN 2-PAK) 0.3 mg/0.3 mL IJ SOAJ injection USE AS DIRECTED FOR LIFE THREATENING ALLERGIC REACTIONS Patient not taking: Reported on 10/07/2018 04/30/15   Jiles Prows, MD  levothyroxine (SYNTHROID) 200 MCG tablet Take by mouth. 06/08/18   [provider]  ondansetron (ZOFRAN) 8 MG tablet Take 8 mg by mouth daily. 07/29/18   [provider]  oxyCODONE (OXY IR/ROXICODONE) 5 MG immediate release tablet Take 1 tablet (5 mg total) by mouth every 6 (six) hours as needed for severe pain. 09/05/18   [name], MD  predniSONE (DELTASONE) 50 MG tablet Take one tablet 13h, 7h and 1h before scan. Patient not taking: Reported on 10/07/2018 09/23/18   [name], MD  promethazine (PHENERGAN) 12.5 MG tablet Take 1 tablet (12.5 mg total) by mouth at bedtime. 09/05/18   [name], MD     Family History  Problem Relation Age of Onset   Diabetes Mother    Heart disease Mother    Heart failure Mother    Heart attack Mother    Hyperlipidemia Mother    Hypertension Mother    Hyperlipidemia Father    Allergic rhinitis Father    Rashes / Skin problems Father    Sudden death Neg Hx    Thyroid disease Neg  Hx     Social History   Socioeconomic History   Marital status: Married    Spouse name: Not on file   Number of children: Not on file   Years of education: Not on file   Highest education level: Not on file    Occupational History   Not on file  Social Needs   Financial resource strain: Not on file   Food insecurity    Worry: Not on file    Inability: Not on file   Transportation needs    Medical: Not on file    Non-medical: Not on file  Tobacco Use   Smoking status: Never Smoker   Smokeless tobacco: Never Used  Substance and Sexual Activity   Alcohol use: No   Drug use: No   Sexual activity: Not on file  Lifestyle   Physical activity    Days per week: Not on file    Minutes per session: Not on file   Stress: Not on file  Relationships   Social connections    Talks on phone: Not on file    Gets together: Not on file    Attends religious service: Not on file    Active member of club or organization: Not on file    Attends meetings of clubs or organizations: Not on file    Relationship status: Not on file  Other Topics Concern   Not on file  Social History Narrative   Not on file    ECOG Status: 0 - Asymptomatic  Review of Systems  Review of Systems: A 12 point ROS discussed and pertinent positives are indicated in the HPI above.  All other systems are negative.  Physical Exam No direct physical exam was performed (except for noted visual exam findings with Video Visits).   Vital Signs: There were no vitals taken for this visit.  Imaging:  Abdominal MRI performed 09/25/2018 demonstrates an approximately 2.4 x 2.0 x 1.6 cm lesion within the anterior segment of the right lobe of the liver, similar in size compared to abdominal MRI performed 06/11/2018 though now with suspected central necrosis.  Note is made of 2 adjacent subcentimeter satellite areas of restricted diffusion best seen on images 118 and 121, series 8), not definitively seen on the 05/2018.  There is an additional lesion with the dome of the left lobe of the liver which is unchanged compared to the 05/2018 examination and demonstrates imaging characteristics compatible with a benign hepatic  hemangioma.  Ct Head Wo Contrast  Result Date: 09/18/2018 CLINICAL DATA:  Acute onset headache EXAM: CT HEAD WITHOUT CONTRAST TECHNIQUE: Contiguous axial images were obtained from the base of the skull through the vertex without intravenous contrast. COMPARISON:  Head CT 06/11/2013 FINDINGS: Brain: There is no mass, hemorrhage or extra-axial collection. The size and configuration of the ventricles and extra-axial CSF spaces are normal. The brain parenchyma is normal, without acute or chronic infarction. Vascular: No abnormal hyperdensity of the major intracranial arteries or dural venous sinuses. No intracranial atherosclerosis. Skull: The visualized skull base, calvarium and extracranial soft tissues are normal. Sinuses/Orbits: No fluid levels or advanced mucosal thickening of the visualized paranasal sinuses. No mastoid or middle ear effusion. The orbits are normal. IMPRESSION: Normal head CT. Electronically Signed   By: Ulyses Jarred M.D.   On: 09/18/2018 23:03   Ct Angio Chest Pe W And/or Wo Contrast  Result Date: 09/18/2018 CLINICAL DATA:  Chest pain and shortness of breath EXAM: CT ANGIOGRAPHY CHEST WITH CONTRAST  TECHNIQUE: Multidetector CT imaging of the chest was performed using the standard protocol during bolus administration of intravenous contrast. Multiplanar CT image reconstructions and MIPs were obtained to evaluate the vascular anatomy. CONTRAST:  183mL OMNIPAQUE IOHEXOL 350 MG/ML SOLN COMPARISON:  09/04/2018 CTA chest FINDINGS: Cardiovascular: --Pulmonary arteries: Contrast injection is sufficient to demonstrate satisfactory opacification of the pulmonary arteries to the segmental level, with attenuation of at least 200 HU at the main pulmonary artery. There is no pulmonary embolus. The main pulmonary artery is within normal limits for size. --Aorta: Limited opacification of the aorta due to bolus timing optimization for the pulmonary arteries. The course and caliber of the aorta are normal.  Conventional 3 vessel aortic branching pattern. There is no aortic atherosclerosis. --Heart: Normal size. No pericardial effusion. Mediastinum/Nodes: Right paratracheal lymph node measures 11 mm, unchanged compared to 09/04/2018. The visualized thyroid and thoracic esophageal course are unremarkable. Lungs/Pleura: No pulmonary nodules or masses. No pleural effusion or pneumothorax. No focal airspace consolidation. No focal pleural abnormality. Upper Abdomen: Contrast bolus timing is not optimized for evaluation of the abdominal organs. The visualized portions of the organs of the upper abdomen are normal. Musculoskeletal: No chest wall abnormality. No bony spinal canal stenosis. Review of the MIP images confirms the above findings. IMPRESSION: 1. No pulmonary embolus or other acute thoracic abnormality. 2. Unchanged 11 mm right paratracheal lymph node. Electronically Signed   By: Ulyses Jarred M.D.   On: 09/18/2018 23:00   Mr Liver W Wo Contrast  Result Date: 09/25/2018 CLINICAL DATA:  Hurthle cell thyroid carcinoma. Unresectable. Evaluate residual metastasis. EXAM: MRI ABDOMEN WITHOUT AND WITH CONTRAST TECHNIQUE: Multiplanar multisequence MR imaging of the abdomen was performed both before and after the administration of intravenous contrast. CONTRAST:  10 cc Gadavist. COMPARISON:  06/11/2018 FINDINGS: Lower chest: Normal heart size without pericardial or pleural effusion. Hepatobiliary: The segment 8 liver lesion is again identified. Based on T2 hyperintensity, measures 2.3 x 1.1 cm on image 14/6. Compare 2.1 x 1.7 cm at the same level on the prior exam (when remeasured). 2.0 cm craniocaudal on image 54/21 today versus similar on the prior exam (when remeasured). Post-contrast enhancement with central hypoenhancement, suggesting necrosis. On diffusion-weighted imaging, there are 2 segment 8 satellite foci of restricted diffusion which measure only 2-3 mm. The more cephalad and lateral (image 118/8) may  correspond to an area of vague post-contrast enhancement on arterial phase image 39/13. Not readily apparent on the prior. The more inferior focus of apparent restricted diffusion is without correlate on other pulse sequences (image 121/8). Segment 2 lesion is similar at 1.3 cm on image 45/17, favoring a hemangioma. Normal gallbladder, without biliary ductal dilatation. Pancreas:  Normal, without mass or ductal dilatation. Spleen:  Normal in size, without focal abnormality. Adrenals/Urinary Tract: Normal adrenal glands. Normal kidneys, without hydronephrosis. Stomach/Bowel: Normal stomach and abdominal small bowel loops. Colonic stool burden suggests constipation. Vascular/Lymphatic: Normal caliber of the aorta and branch vessels. No retroperitoneal or retrocrural adenopathy. Other:  No ascites. Musculoskeletal: No acute osseous abnormality. IMPRESSION: 1. Dominant segment 8 liver lesion is felt to be similar in size with new central hypoenhancement which may represent necrosis. 2. Two satellite foci of restricted diffusion which measure only 2-3 mm. One of these findings likely corresponds to an area of vague segment 8 heterogeneous enhancement. The other is without correlate on other pulse sequences. Recommend follow-up attention to exclude tiny satellite metastasis. 3. No evidence of extrahepatic metastatic disease. Electronically Signed   By: Marylyn Ishihara  Jobe Igo M.D.   On: 09/25/2018 16:38   Nm Pet Image Restag (ps) Skull Base To Thigh  Result Date: 09/26/2018 CLINICAL DATA:  Subsequent treatment strategy for metastatic thyroid cancer. Hurthle cell carcinoma. EXAM: NUCLEAR MEDICINE PET SKULL BASE TO THIGH TECHNIQUE: 13.8 mCi F-18 FDG was injected intravenously. Full-ring PET imaging was performed from the skull base to thigh after the radiotracer. CT data was obtained and used for attenuation correction and anatomic localization. Fasting blood glucose: 78 mg/dl COMPARISON:  None. FINDINGS: Mediastinal blood pool  activity: SUV max 3.4 Liver activity: SUV max NA NECK: Resolution of the bulky hypermetabolic RIGHT cervical lymph node seen on comparison exam. No residual measurable adenopathy. No hypermetabolic lymph nodes remain. No activity in the thyroid bed itself. There is linear activity the proximal esophagus extending from the larynx to the carina. No mass lesion associated with esophageal metabolic activity. Linear activity in distal esophagus additionally leading up to the GE junction similar comparison exam Incidental CT findings: None CHEST: Minimally enlarged RIGHT paratracheal lymph node measures 14 mm with SUV max equal 3.2. Activity is node is significantly decreased from SUV max 17 on comparison exam Incidental CT findings: No pulmonary nodules. ABDOMEN/PELVIS: Intensely hypermetabolic solitary hepatic metastasis is increased in size and metabolic activity with SUV max equal 17 increased from 11. Lesion now measures 2.5 cm increased from 1.5 cm. No additional hepatic lesions are identified. Activity in the distal esophagus noted.  No change. No hypermetabolic abdominopelvic lymph nodes. No abnormal activity in the bowel. Incidental CT findings: none SKELETON: No focal hypermetabolic activity to suggest skeletal metastasis. Incidental CT findings: none IMPRESSION: 1. Hypermetabolic solitary lesion in the RIGHT hepatic lobe increased in size and metabolic activity compared to PET-CT scan of February 2020. 2. Resolution of hypermetabolic adenopathy in the RIGHT neck. 3. Small paratracheal lymph node on the RIGHT has mild residual metabolic activity but much reduced from comparison PET-CT scan. 4. No evidence of residual activity in thyroid bed. 5. Linear activity in the proximal esophagus and distal esophagus is favored inflammatory. Query esophagitis and consider endoscopy. 6. No suspicious pulmonary nodules. 7. No skeletal metastasis. Electronically Signed   By: Suzy Bouchard M.D.   On: 09/26/2018 08:09     Labs:  CBC: Recent Labs    09/04/18 2207 09/12/18 1150 09/18/18 2132 10/07/18 1103  WBC 4.0 4.0 3.5* 3.2*  HGB 15.3 15.0 15.6 13.0  HCT 44.8 44.4 46.8 38.2*  PLT 148* 179 230 96*    COAGS: Recent Labs    06/15/18 0630  INR 1.0  APTT 27    BMP: Recent Labs    09/04/18 2207 09/12/18 1150 09/18/18 2132 10/07/18 1103  NA 140 138 138 138  K 4.2 4.6 4.5 5.7*  CL 104 101 101 106  CO2 28 30 27 26   GLUCOSE 79 92 90 93  BUN 15 18 17  41*  CALCIUM 8.8* 8.8* 9.3 8.7*  CREATININE 0.94 0.95 1.02 1.17  GFRNONAA >60 >60 >60 >60  GFRAA >60 >60 >60 >60    LIVER FUNCTION TESTS: Recent Labs    09/02/18 0832 09/12/18 1150 09/18/18 2132 10/07/18 1103  BILITOT 1.0 0.9 0.5 0.8  AST 26 21 21 26   ALT 28 21 21 24   ALKPHOS 85 104 112 72  PROT 6.0* 6.4* 6.1* 5.8*  ALBUMIN 4.2 4.1 3.9 3.7    TUMOR MARKERS: No results for input(s): AFPTM, CEA, CA199, CHROMGRNA in the last 8760 hours.  Assessment and Plan:  EVIN LOISEAU is a  43 y.o. male with history of nephrolithiasis, hypertension and metastatic thyroid cancer who is seen in consultation via telemedicine for evaluation of hepatic directed therapy for liver lesion.  Patient is in his baseline state of well health tolerating current regimen of Lenvima without incident.   He is interested in pursuing all available treatment options. ______________________________________________________  Imaging review as follows:   Review of abdominal MRI performed 09/25/2018 demonstrates an approximately 2.4 x 2.0 x 1.6 cm lesion within the anterior segment of the right lobe of the liver, similar in size compared to abdominal MRI performed 06/11/2018, though now with suspected central necrosis. Note is also made of 2 adjacent subcentimeter satellite areas of restricted diffusion best seen on images 118 and 121, series 8), and while indeterminate given their small size were not definitively seen on abdominal MRI performed 05/2018,  There is  an additional lesion with the dome of the left lobe of the liver which is unchanged compared to the 05/2018 examination and demonstrates imaging characteristics compatible with a benign hepatic hemangioma. ______________________________________________________  Given patient's young age and high functional status, I feel the patient would benefit from formal consultation by a hepatobiliary surgeon (Dr. Barry Dienes) to evaluate for appropriateness of definitive hepatic resection.    This is particularly of concern given the two satellite areas of subcentimeter restricted diffusion adjacent to the dominant lesion within the right lobe of the liver which were not definitively seen on previous abdominal MRI performed 06/11/2018.    If patient is ultimately deemed not an operative candidate, I would offer image guided microwave ablation of the dominant lesion within the liver.  I explained that the two small areas of restricted diffusion would likely not be amenable to percutaneous ablation at this time given their small size and lack of definitive visualization either with CT or ultrasound guidance.   The patient is agreement with the above plan of care and knows to call the interventional radiology clinic with any future questions or concerns.  PLAN: - Fefer to Dr. Barry Dienes for evaluation for definitive hepatic resection. - If patient is deemed NOT an operative candidate, we will arrange for repeat telemedicine consultation to formally discuss proceeding with image guided ablation of the dominant lesion within the right lobe of the liver.  Thank you for this interesting consult.  I greatly enjoyed meeting Brian [name] and look forward to participating in their care.  A copy of this report was sent to the requesting provider on this date.  Electronically Signed: Sandi Mariscal 10/11/2018, 3:46 PM   I spent a total of 15 Minutes in remote  clinical consultation, greater than 50% of which was  counseling/coordinating care for hepatic metastatic disease.    Visit type: Audio only (telephone). Audio (no video) only due to patient's lack of internet/smartphone capability. Alternative for in-person consultation at [address] [facility], [address] [place], [place], [place]. This visit type was conducted due to national recommendations for restrictions regarding the COVID-19 Pandemic (e.g. social distancing).  This format is felt to be most appropriate for this patient at this time.  All issues noted in this document were discussed and addressed.

## 2018-10-12 ENCOUNTER — Other Ambulatory Visit: Payer: Self-pay | Admitting: *Deleted

## 2018-10-12 ENCOUNTER — Encounter: Payer: Self-pay | Admitting: Hematology & Oncology

## 2018-10-12 ENCOUNTER — Encounter: Payer: Self-pay | Admitting: *Deleted

## 2018-10-12 LAB — THYROGLOBULIN LEVEL: Thyroglobulin: <2 ng/mL

## 2018-10-12 MED ORDER — TORSEMIDE 10 MG PO TABS: 10.0000 mg | ORAL_TABLET | Freq: Every day | ORAL | 1 refills | Status: DC

## 2018-10-13 ENCOUNTER — Inpatient Hospital Stay: Payer: 59

## 2018-10-14 ENCOUNTER — Other Ambulatory Visit: Payer: Self-pay

## 2018-10-14 ENCOUNTER — Other Ambulatory Visit: Payer: Self-pay | Admitting: *Deleted

## 2018-10-14 ENCOUNTER — Inpatient Hospital Stay: Payer: 59

## 2018-10-14 ENCOUNTER — Telehealth: Payer: Self-pay | Admitting: *Deleted

## 2018-10-14 DIAGNOSIS — M5136 Other intervertebral disc degeneration, lumbar region: Secondary | ICD-10-CM | POA: Diagnosis not present

## 2018-10-14 DIAGNOSIS — C73 Malignant neoplasm of thyroid gland: Secondary | ICD-10-CM

## 2018-10-14 DIAGNOSIS — M542 Cervicalgia: Secondary | ICD-10-CM | POA: Diagnosis not present

## 2018-10-14 DIAGNOSIS — M2578 Osteophyte, vertebrae: Secondary | ICD-10-CM | POA: Diagnosis not present

## 2018-10-14 DIAGNOSIS — C799 Secondary malignant neoplasm of unspecified site: Secondary | ICD-10-CM | POA: Diagnosis not present

## 2018-10-14 DIAGNOSIS — M7918 Myalgia, other site: Secondary | ICD-10-CM | POA: Diagnosis not present

## 2018-10-14 DIAGNOSIS — Z79899 Other long term (current) drug therapy: Secondary | ICD-10-CM | POA: Diagnosis not present

## 2018-10-14 DIAGNOSIS — M25552 Pain in left hip: Secondary | ICD-10-CM | POA: Diagnosis not present

## 2018-10-14 DIAGNOSIS — C787 Secondary malignant neoplasm of liver and intrahepatic bile duct: Secondary | ICD-10-CM | POA: Diagnosis not present

## 2018-10-14 LAB — CBC WITH DIFFERENTIAL (CANCER CENTER ONLY)
Abs Immature Granulocytes: 0.01 10*3/uL (ref 0.00–0.07)
Basophils Absolute: 0 10*3/uL (ref 0.0–0.1)
Basophils Relative: 0 %
Eosinophils Absolute: 0.1 10*3/uL (ref 0.0–0.5)
Eosinophils Relative: 2 %
HCT: 38.3 % — ABNORMAL LOW (ref 39.0–52.0)
Hemoglobin: 12.8 g/dL — ABNORMAL LOW (ref 13.0–17.0)
Immature Granulocytes: 0 %
Lymphocytes Relative: 23 %
Lymphs Abs: 0.8 10*3/uL (ref 0.7–4.0)
MCH: 29.1 pg (ref 26.0–34.0)
MCHC: 33.4 g/dL (ref 30.0–36.0)
MCV: 87 fL (ref 80.0–100.0)
Monocytes Absolute: 0.3 10*3/uL (ref 0.1–1.0)
Monocytes Relative: 8 %
Neutro Abs: 2.2 10*3/uL (ref 1.7–7.7)
Neutrophils Relative %: 67 %
Platelet Count: 124 10*3/uL — ABNORMAL LOW (ref 150–400)
RBC: 4.4 MIL/uL (ref 4.22–5.81)
RDW: 14 % (ref 11.5–15.5)
WBC Count: 3.3 10*3/uL — ABNORMAL LOW (ref 4.0–10.5)
nRBC: 0 % (ref 0.0–0.2)

## 2018-10-14 LAB — BASIC METABOLIC PANEL - CANCER CENTER ONLY
Anion gap: 5 (ref 5–15)
BUN: 27 mg/dL — ABNORMAL HIGH (ref 6–20)
CO2: 26 mmol/L (ref 22–32)
Calcium: 8.8 mg/dL — ABNORMAL LOW (ref 8.9–10.3)
Chloride: 106 mmol/L (ref 98–111)
Creatinine: 1.21 mg/dL (ref 0.61–1.24)
GFR, Est AFR Am: >60 mL/min (ref >60)
GFR, Estimated: >60 mL/min (ref >60)
Glucose, Bld: 97 mg/dL (ref 70–99)
Potassium: 5.5 mmol/L — ABNORMAL HIGH (ref 3.5–5.1)
Sodium: 137 mmol/L (ref 135–145)

## 2018-10-14 MED ORDER — AMLODIPINE BESYLATE 10 MG PO TABS: 10.0000 mg | ORAL_TABLET | Freq: Every day | ORAL | 2 refills | Status: DC

## 2018-10-14 NOTE — Telephone Encounter (Signed)
Per Dr. Marin Olp, I called the patient and gave him lab results. Patient verbalized understanding. He requested Dr. Sharyne Richters, PCP, get a copy of labs. I told him I would fax them to him.

## 2018-10-17 ENCOUNTER — Other Ambulatory Visit: Payer: Self-pay | Admitting: *Deleted

## 2018-10-17 ENCOUNTER — Telehealth: Payer: Self-pay | Admitting: *Deleted

## 2018-10-17 DIAGNOSIS — C73 Malignant neoplasm of thyroid gland: Secondary | ICD-10-CM

## 2018-10-17 MED ORDER — HYDROCHLOROTHIAZIDE 25 MG PO TABS: 25.0000 mg | ORAL_TABLET | Freq: Every day | ORAL | 2 refills | Status: DC

## 2018-10-17 NOTE — Telephone Encounter (Signed)
Call received from patient stating that he felt like he was "hit by a sledgehammer last night, hurting all over which was a sharp/burning type pain and was nauseated."  He also states that his foot and leg swelling is not decreasing.  Dr. Marin Olp notified of above.  Call placed back to patient and patient notified per order of Dr. Marin Olp to come in tomorrow for CBC and CMET, to stop Demadex and to start HCTZ 25 mg daily.  Pt appreciative of call back and has no further questions or concerns at this time.

## 2018-10-18 ENCOUNTER — Other Ambulatory Visit: Payer: Self-pay

## 2018-10-18 ENCOUNTER — Telehealth: Payer: Self-pay | Admitting: *Deleted

## 2018-10-18 ENCOUNTER — Inpatient Hospital Stay: Payer: 59 | Attending: Hematology & Oncology

## 2018-10-18 DIAGNOSIS — M7989 Other specified soft tissue disorders: Secondary | ICD-10-CM | POA: Insufficient documentation

## 2018-10-18 DIAGNOSIS — C73 Malignant neoplasm of thyroid gland: Secondary | ICD-10-CM | POA: Insufficient documentation

## 2018-10-18 DIAGNOSIS — R609 Edema, unspecified: Secondary | ICD-10-CM | POA: Insufficient documentation

## 2018-10-18 DIAGNOSIS — I10 Essential (primary) hypertension: Secondary | ICD-10-CM | POA: Insufficient documentation

## 2018-10-18 DIAGNOSIS — C787 Secondary malignant neoplasm of liver and intrahepatic bile duct: Secondary | ICD-10-CM | POA: Insufficient documentation

## 2018-10-18 DIAGNOSIS — R0789 Other chest pain: Secondary | ICD-10-CM | POA: Insufficient documentation

## 2018-10-18 LAB — CMP (CANCER CENTER ONLY)
ALT: 25 U/L (ref 0–44)
AST: 28 U/L (ref 15–41)
Albumin: 3.7 g/dL (ref 3.5–5.0)
Alkaline Phosphatase: 102 U/L (ref 38–126)
Anion gap: 5 (ref 5–15)
BUN: 29 mg/dL — ABNORMAL HIGH (ref 6–20)
CO2: 31 mmol/L (ref 22–32)
Calcium: 9.5 mg/dL (ref 8.9–10.3)
Chloride: 103 mmol/L (ref 98–111)
Creatinine: 1.42 mg/dL — ABNORMAL HIGH (ref 0.61–1.24)
GFR, Est AFR Am: >60 mL/min (ref >60)
GFR, Estimated: >60 mL/min (ref >60)
Glucose, Bld: 96 mg/dL (ref 70–99)
Potassium: 5.4 mmol/L — ABNORMAL HIGH (ref 3.5–5.1)
Sodium: 139 mmol/L (ref 135–145)
Total Bilirubin: 0.7 mg/dL (ref 0.3–1.2)
Total Protein: 5.7 g/dL — ABNORMAL LOW (ref 6.5–8.1)

## 2018-10-18 LAB — CBC WITH DIFFERENTIAL (CANCER CENTER ONLY)
Abs Immature Granulocytes: 0.01 10*3/uL (ref 0.00–0.07)
Basophils Absolute: 0 10*3/uL (ref 0.0–0.1)
Basophils Relative: 0 %
Eosinophils Absolute: 0.1 10*3/uL (ref 0.0–0.5)
Eosinophils Relative: 3 %
HCT: 40 % (ref 39.0–52.0)
Hemoglobin: 13.3 g/dL (ref 13.0–17.0)
Immature Granulocytes: 0 %
Lymphocytes Relative: 32 %
Lymphs Abs: 1 10*3/uL (ref 0.7–4.0)
MCH: 29.2 pg (ref 26.0–34.0)
MCHC: 33.3 g/dL (ref 30.0–36.0)
MCV: 87.9 fL (ref 80.0–100.0)
Monocytes Absolute: 0.3 10*3/uL (ref 0.1–1.0)
Monocytes Relative: 10 %
Neutro Abs: 1.7 10*3/uL (ref 1.7–7.7)
Neutrophils Relative %: 55 %
Platelet Count: 136 10*3/uL — ABNORMAL LOW (ref 150–400)
RBC: 4.55 MIL/uL (ref 4.22–5.81)
RDW: 14.2 % (ref 11.5–15.5)
WBC Count: 3.1 10*3/uL — ABNORMAL LOW (ref 4.0–10.5)
nRBC: 0 % (ref 0.0–0.2)

## 2018-10-18 NOTE — Telephone Encounter (Signed)
Call placed to patient to inform him per order of Dr. Marin Olp that his creat is slightly elevated and to push fluids today.  Pt states that he is feeling slightly better today.  Instructed pt to call office back with any other concerns or complaints.  Pt appreciative of call.

## 2018-10-19 ENCOUNTER — Encounter: Payer: Self-pay | Admitting: Hematology & Oncology

## 2018-10-27 ENCOUNTER — Other Ambulatory Visit: Payer: Self-pay | Admitting: *Deleted

## 2018-10-27 ENCOUNTER — Encounter: Payer: Self-pay | Admitting: Hematology & Oncology

## 2018-10-31 NOTE — Progress Notes (Signed)
Office Visit    Patient Name: Brian [name] Date of Encounter: 11/01/2018  Primary Care Provider:  Sharyne Richters, MD Primary Cardiologist:  Jenne Campus, MD Electrophysiologist:  None   Chief Complaint    Brian [name] is a 43 y.o. 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  Past Medical History    Past Medical History:  Diagnosis Date  . Essential hypertension 09/09/2018  . Kidney stone   . Primary cancer of thyroid with metastasis to other site ([place]) 05/04/2018  . Rickettsia infection    Past Surgical History:  Procedure Laterality Date  . BIOPSY  04/05/2018   Procedure: BIOPSY;  Surgeon: Ronald Lobo, MD;  Location: [facility];  Service: Endoscopy;;  . CHOLECYSTECTOMY    . ESOPHAGOGASTRODUODENOSCOPY (EGD) WITH PROPOFOL N/A 04/05/2018   Procedure: ESOPHAGOGASTRODUODENOSCOPY (EGD) WITH PROPOFOL;  Surgeon: Ronald Lobo, MD;  Location: [facility];  Service: Endoscopy;  Laterality: N/A;  . IR RADIOLOGIST EVAL & MGMT  10/11/2018    Allergies  Allergies  Allergen Reactions  . Dextromethorphan-Guaifenesin Other (See Comments)    Irregular heartbeat   . Doxycycline Anaphylaxis, Nausea And Vomiting, Rash and Other (See Comments)    "heart arrythmia" and "dyspepsia" (only oral doxycycline causes reaction)  . Gadobutrol Hives and Other (See Comments)    Patient had MRI scan at [place]. Patient called one hour after he left imaging facility to report two "blisters" that came up on "back" of lip.    . Gadolinium Derivatives Hives and Other (See Comments)    Patient had MRI scan at [place]. Patient called one hour after he left imaging facility to report two "blisters" that came up on "back" of lip.   . Guaifenesin Palpitations       . Ibuprofen Itching  . Lisinopril Other (See Comments)    Angioedema  .  Pantoprazole Itching  . Tramadol Hives and Itching    History of Present Illness    Brian [name] is a 43 y.o. 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 He was last seen by Dr. Agustin Cree 09/09/18.  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 Typically works 12 hours shifts. He enjoys spending time with his wife, daughter, and dog. He follows closely with Dr. Hulda Marin of oncology as well as Dr. Barrington Ellison at the [facility]. He additionally follows with Dr. Hartford Poli of endocrinology.   Seen by Dr. Hartford Poli of [facility] with notation that if TSH was <1 would continue Levothyroxine at 223mcg daily. TSH 07/22/18 0.179, Free T4 1.35, Thyroglobulin Ab <1.0, Thyroglobulin 1.1.   His blood pressure has been difficult to control. Lisinopril was previously stopped due to angioedema. On 10/27/18 his Amlodipine was stopped due to LE edema. Prior to stopping Amlodipine reports his BP at home was average 120s/80s. Since starting the HCTZ he reports he has noticed no increase in urine output, but feels dehydrated. Has noticed new constipation and "cotton mouth". Tells me he stopped his HCTZ over the weekend and BP were elevated, but he felt better hydrated. Woke up this morning with some chest discomfort which he tells me occurs when his blood pressure is high. 4:30 this AM BP 154/99, took his HCTZ. 7A BP 166/97, took his Clonidine.   Reports new onset fatigue for the  last few weeks "all the time". Tells me he "feels like a slug". Continues to work 12 hour shifts with difficulty and tells me he has been allowed to reduce one shift per week to 8 hours.   LE edema is bilateral up to his knee. He does try to monitor his sodium intake. He tried to wear compression stockings at work and told me they dug into his ankle and made his feet feel numb. On days off he does sit with feet elevated. We discussed that as this is likely a side effect of  the amlodipine it can take 4-6 weeks to go away and often is not helped by diuretics. ACE/ARBs have been found to be effective, but would not utilize due to previous angioedema with Lisinopril.   Over the last few days has noted DOE but no SOB at rest. Denies wheeze, cough.   EKGs/Labs/Other Studies Reviewed:   The following studies were reviewed today:  Echo 04/18/18  1. The left ventricle has normal systolic function with an ejection fraction of 60-65%. The cavity size was normal. There is mild concentric left ventricular hypertrophy. Left ventricular diastolic parameters were normal.  2. The right ventricle has normal systolic function. The cavity was mildly enlarged. There is no increase in right ventricular wall thickness.  3. The aortic root, ascending aorta and aortic arch are normal in size and structure.  04/29/18 Holter Monitor Baseline rhythm: Sinus   Minimum heart rate: 42 BPM.  Average heart rate: 64 BPM.  Maximal heart rate 108 BPM. Atrial arrhythmia: 7 premature supraventricular beats Ventricular arrhythmia: Total of 376 premature ventricular beats Conduction abnormality: None  Symptoms: None   Conclusion:  1 infrequent PVCs, infrequent PACs, no sustained arrhythmias  EKG:  EKG independently reviewed from 09/18/18 shows SR with no acute ST/T wave changes.   Recent Labs: 09/12/2018: Magnesium 2.1 09/18/2018: TSH 2.472 10/18/2018: ALT 25; BUN 29; Creatinine 1.42; Hemoglobin 13.3; Platelet Count 136; Potassium 5.4; Sodium 139  Recent Lipid Panel 08/10/2017: Total cholesterol 184, HDL 29, LDL 132, Trilycerides 117  Home Medications   Current Meds  Medication Sig  . cloNIDine (CATAPRES) 0.2 MG tablet Take 1 tablet (0.2 mg total) by mouth 2 (two) times daily.  Marland Kitchen EPINEPHrine (EPIPEN 2-PAK) 0.3 mg/0.3 mL IJ SOAJ injection USE AS DIRECTED FOR LIFE THREATENING ALLERGIC REACTIONS  . LENVIMA, 24 MG DAILY DOSE, 2 x 10 MG & 4 MG capsule Take 24 mg by mouth daily.  Marland Kitchen levothyroxine  (SYNTHROID) 200 MCG tablet Take by mouth.  . ondansetron (ZOFRAN) 8 MG tablet Take 8 mg by mouth daily.  Marland Kitchen oxyCODONE (OXY IR/ROXICODONE) 5 MG immediate release tablet Take 1 tablet (5 mg total) by mouth every 6 (six) hours as needed for severe pain.  . promethazine (PHENERGAN) 12.5 MG tablet Take 1 tablet (12.5 mg total) by mouth at bedtime.  . [DISCONTINUED] hydrochlorothiazide (HYDRODIURIL) 25 MG tablet Take 1 tablet (25 mg total) by mouth daily.    Review of Systems      Review of Systems  Constitution: Positive for malaise/fatigue. Negative for chills and fever.  Cardiovascular: Positive for chest pain ("when my blood pressure is high"), dyspnea on exertion and leg swelling (bilateral). Negative for irregular heartbeat, near-syncope, orthopnea, palpitations and syncope.  Respiratory: Negative for cough, sputum production and wheezing.   Gastrointestinal: Positive for constipation ("associated with HCTZ") and nausea. Negative for vomiting.  Neurological: Positive for brief paralysis. Negative for dizziness, light-headedness and weakness.   All other systems reviewed and  are otherwise negative except as noted above.  Physical Exam    VS:  BP (!) 146/90 (BP Location: Left Arm, Patient Position: Sitting, [name] Size: Normal)   Pulse 61   Temp (!) 97.3 F (36.3 C) (Temporal)   Resp 18   Ht 5\' 10"  (1.778 m)   Wt 263 lb (119.3 kg)   SpO2 99%   BMI 37.74 kg/m  , BMI Body mass index is 37.74 kg/m. GEN: Well nourished, well developed, in no acute distress. HEENT: normal. Neck: Supple, no JVD, carotid bruits, or masses. Cardiac: RRR, no murmurs, rubs, or gallops. No clubbing, cyanosis.  Bilateral lower extremities with 2+ pitting edema to the knee.  Radials/DP/PT 2+ and equal bilaterally.  Respiratory:  Respirations regular and unlabored.  Right upper lobe diminished.  Left upper lobe and bilateral lower lobes clear to auscultation.  No wheeze, rhonchi, crackle. GI: Soft, nontender,  nondistended, BS + x 4. MS: No deformity or atrophy. Skin: Warm and dry, no rash. Neuro:  Strength and sensation are intact. Psych: Normal affect.  Assessment & Plan    1. Bilateral LE edema - Echo 04/2018 with normal LVEF and normal diastolic function. Likely due to Amlodipine 10mg  which was stopped on 10/27/18. Reassured patient that this often taked 4-6 weeks to resolve. Did not respond to Torsemide nor presently responding to HCTZ. Hesitant to utilize Spironolactone as his K is recurrently elevated. Recheck echocardiogram.   2. HTN - Presently on Clonidine 0.2mg  BID and HCTZ 25mg . Reports HCTZ makes him dehydrated - notices "cotton mouth" and constipation.   Previously anti-hypertensive medications include Lisinopril (angioedema) and Amlodipine (marked LE edema). Hesitant to utilize ARB in setting of ACE angioedema. Hesitant to utilize beta blocker due to low resting HR. Presently on Clonidine 0.2mg  BID and HCTZ 25mg  daily.  Stop HCTZ. Start Hydralazine 25mg  TID.   He will continue to check a BP daily and keep a log.   3. DOE - Reports new and worsening DOE. Has been noticed by his coworkers as well. Etiology HF vs deconditioning vs pulmonary. CXR ordered as RUL diminished on exam. Echocardiogram ordered.   4. Fatigue - Reports worsening fatigue over the last 2 weeks. Etiology chemotherapy vs deconditioning vs thyroid vs anemia.   Hb 13.3 on 10/18/18 and no reported bleeding. Had labs immediately before our visit with results on 11/01/18 including Hb 12.   Labs at endocrinology of [facility] 07/22/18 with TSH 0.179, Free T4 1.35 with goal of TSH <1 per Dr. Shirlyn Goltz documentation.   5. PVC - Noted history.  He denies recurrent palpitations and states they are not bothering him at this time.  Dr. Agustin Cree saw the patient during our visit and agreed to the present plan of care.  Disposition: Follow up in 1 month(s) with Dr. Agustin Cree as previously scheduled.   Loel Dubonnet, NP 11/01/2018,  9:57 AM

## 2018-11-01 ENCOUNTER — Inpatient Hospital Stay: Payer: 59

## 2018-11-01 ENCOUNTER — Encounter: Payer: Self-pay | Admitting: Family

## 2018-11-01 ENCOUNTER — Ambulatory Visit (HOSPITAL_BASED_OUTPATIENT_CLINIC_OR_DEPARTMENT_OTHER)
Admission: RE | Admit: 2018-11-01 | Discharge: 2018-11-01 | Disposition: A | Discharge status: Home / Self Care | Payer: 59 | Source: Ambulatory Visit | Attending: Family | Admitting: Family

## 2018-11-01 ENCOUNTER — Other Ambulatory Visit: Payer: Self-pay

## 2018-11-01 ENCOUNTER — Ambulatory Visit (INDEPENDENT_AMBULATORY_CARE_PROVIDER_SITE_OTHER): Payer: 59 | Admitting: Family

## 2018-11-01 ENCOUNTER — Other Ambulatory Visit: Payer: Self-pay | Admitting: *Deleted

## 2018-11-01 VITALS — BP 146/90 | HR 61 | Temp 97.3°F | Resp 18 | Ht 70.0 in | Wt 263.0 lb

## 2018-11-01 DIAGNOSIS — C73 Malignant neoplasm of thyroid gland: Secondary | ICD-10-CM

## 2018-11-01 DIAGNOSIS — R0609 Other forms of dyspnea: Secondary | ICD-10-CM | POA: Diagnosis not present

## 2018-11-01 DIAGNOSIS — R600 Localized edema: Secondary | ICD-10-CM | POA: Diagnosis not present

## 2018-11-01 DIAGNOSIS — I10 Essential (primary) hypertension: Secondary | ICD-10-CM

## 2018-11-01 DIAGNOSIS — D696 Thrombocytopenia, unspecified: Secondary | ICD-10-CM

## 2018-11-01 DIAGNOSIS — I493 Ventricular premature depolarization: Secondary | ICD-10-CM

## 2018-11-01 DIAGNOSIS — R0602 Shortness of breath: Secondary | ICD-10-CM | POA: Diagnosis not present

## 2018-11-01 DIAGNOSIS — R0600 Dyspnea, unspecified: Secondary | ICD-10-CM

## 2018-11-01 LAB — CBC WITH DIFFERENTIAL (CANCER CENTER ONLY)
Abs Immature Granulocytes: 0.01 10*3/uL (ref 0.00–0.07)
Basophils Absolute: 0 10*3/uL (ref 0.0–0.1)
Basophils Relative: 0 %
Eosinophils Absolute: 0 10*3/uL (ref 0.0–0.5)
Eosinophils Relative: 1 %
HCT: 36.5 % — ABNORMAL LOW (ref 39.0–52.0)
Hemoglobin: 12 g/dL — ABNORMAL LOW (ref 13.0–17.0)
Immature Granulocytes: 0 %
Lymphocytes Relative: 28 %
Lymphs Abs: 0.9 10*3/uL (ref 0.7–4.0)
MCH: 28.8 pg (ref 26.0–34.0)
MCHC: 32.9 g/dL (ref 30.0–36.0)
MCV: 87.7 fL (ref 80.0–100.0)
Monocytes Absolute: 0.4 10*3/uL (ref 0.1–1.0)
Monocytes Relative: 11 %
Neutro Abs: 1.9 10*3/uL (ref 1.7–7.7)
Neutrophils Relative %: 60 %
Platelet Count: 117 10*3/uL — ABNORMAL LOW (ref 150–400)
RBC: 4.16 MIL/uL — ABNORMAL LOW (ref 4.22–5.81)
RDW: 14.8 % (ref 11.5–15.5)
WBC Count: 3.2 10*3/uL — ABNORMAL LOW (ref 4.0–10.5)
nRBC: 0 % (ref 0.0–0.2)

## 2018-11-01 LAB — CMP (CANCER CENTER ONLY)
ALT: 24 U/L (ref 0–44)
AST: 28 U/L (ref 15–41)
Albumin: 3.1 g/dL — ABNORMAL LOW (ref 3.5–5.0)
Alkaline Phosphatase: 99 U/L (ref 38–126)
Anion gap: 3 — ABNORMAL LOW (ref 5–15)
BUN: 31 mg/dL — ABNORMAL HIGH (ref 6–20)
CO2: 27 mmol/L (ref 22–32)
Calcium: 7.8 mg/dL — ABNORMAL LOW (ref 8.9–10.3)
Chloride: 107 mmol/L (ref 98–111)
Creatinine: 1.38 mg/dL — ABNORMAL HIGH (ref 0.61–1.24)
GFR, Est AFR Am: >60 mL/min (ref >60)
GFR, Estimated: >60 mL/min (ref >60)
Glucose, Bld: 111 mg/dL — ABNORMAL HIGH (ref 70–99)
Potassium: 5.2 mmol/L — ABNORMAL HIGH (ref 3.5–5.1)
Sodium: 137 mmol/L (ref 135–145)
Total Bilirubin: 0.5 mg/dL (ref 0.3–1.2)
Total Protein: 4.6 g/dL — ABNORMAL LOW (ref 6.5–8.1)

## 2018-11-01 LAB — LACTATE DEHYDROGENASE: LDH: 247 U/L — ABNORMAL HIGH (ref 98–192)

## 2018-11-01 MED ORDER — HYDRALAZINE HCL 25 MG PO TABS: 25.0000 mg | ORAL_TABLET | Freq: Three times a day (TID) | ORAL | 2 refills | Status: DC

## 2018-11-01 NOTE — Patient Instructions (Signed)
Medication Instructions:  Your physician has recommended you make the following change in your medication:   STOP Hydrochlorothiazide HCTZ  START Hydralazine 25mg  (one tablet) three times per day.   If you need a refill on your cardiac medications before your next appointment, please call your pharmacy.   Lab work: No lab work today. We will ask Dr. Ferne Reus about checking your TSH or thyroid function due to fatigue.   If you have labs (blood work) drawn today and your tests are completely normal, you will receive your results only by: Marland Kitchen MyChart Message (if you have MyChart) OR . A paper copy in the mail If you have any lab test that is abnormal or we need to change your treatment, we will call you to review the results.  Testing/Procedures: Your physician has requested that you have an echocardiogram. Echocardiography is a painless test that uses sound waves to create images of your heart. It provides your doctor with information about the size and shape of your heart and how well your heart's chambers and valves are working. This procedure takes approximately one hour. There are no restrictions for this procedure.  A chest x-ray takes a picture of the organs and structures inside the chest, including the heart, lungs, and blood vessels. This test can show several things, including, whether the heart is enlarges; whether fluid is building up in the lungs; and whether pacemaker / defibrillator leads are still in place.  Follow-Up: At [facility], you and your health needs are our priority.  As part of our continuing mission to provide you with exceptional heart care, we have created designated Provider Care Teams.  These Care Teams include your primary Cardiologist (physician) and Advanced Practice Providers (APPs -  Physician Assistants and Nurse Practitioners) who all work together to provide you with the care you need, when you need it. You will need a follow up appointment in 1 months.   You may see Jenne Campus, MD or another member of our [facility] Provider Team in [place]: Shirlee More, MD . Jyl Heinz, MD  Any Other Special Instructions Will Be Listed Below (If Applicable).  Hydralazine tablets What is this medicine? HYDRALAZINE (hye DRAL a zeen) is a type of vasodilator. It relaxes blood vessels, increasing the blood and oxygen supply to your heart. This medicine is used to treat high blood pressure. This medicine may be used for other purposes; ask your health care provider or pharmacist if you have questions. COMMON BRAND NAME(S): Apresoline What should I tell my health care provider before I take this medicine? They need to know if you have any of these conditions:  blood vessel disease  heart disease including angina or history of heart attack  kidney or liver disease  systemic lupus erythematosus (SLE)  an unusual or allergic reaction to hydralazine, tartrazine dye, other medicines, foods, dyes, or preservatives  pregnant or trying to get pregnant  breast-feeding How should I use this medicine? Take this medicine by mouth with a glass of water. Follow the directions on the prescription label. Take your doses at regular intervals. Do not take your medicine more often than directed. Do not stop taking except on the advice of your doctor or health care professional. Talk to your pediatrician regarding the use of this medicine in children. Special care may be needed. While this drug may be prescribed for children for selected conditions, precautions do apply. Overdosage: If you think you have taken too much of this medicine contact  a poison control center or emergency room at once. NOTE: This medicine is only for you. Do not share this medicine with others. What if I miss a dose? If you miss a dose, take it as soon as you can. If it is almost time for your next dose, take only that dose. Do not take double or extra doses. What may interact  with this medicine?  medicines for high blood pressure  medicines for mental depression This list may not describe all possible interactions. Give your health care provider a list of all the medicines, herbs, non-prescription drugs, or dietary supplements you use. Also tell them if you smoke, drink alcohol, or use illegal drugs. Some items may interact with your medicine. What should I watch for while using this medicine? Visit your doctor or health care professional for regular checks on your progress. Check your blood pressure and pulse rate regularly. Ask your doctor or health care professional what your blood pressure and pulse rate should be and when you should contact him or her. You may get drowsy or dizzy. Do not drive, use machinery, or do anything that needs mental alertness until you know how this medicine affects you. Do not stand or sit up quickly, especially if you are an older patient. This reduces the risk of dizzy or fainting spells. Alcohol may interfere with the effect of this medicine. Avoid alcoholic drinks. Do not treat yourself for coughs, colds, or pain while you are taking this medicine without asking your doctor or health care professional for advice. Some ingredients may increase your blood pressure. What side effects may I notice from receiving this medicine? Side effects that you should report to your doctor or health care professional as soon as possible:  chest pain, or fast or irregular heartbeat  fever, chills, or sore throat  numbness or tingling in the hands or feet  shortness of breath  skin rash, redness, blisters or itching  stiff or swollen joints  sudden weight gain  swelling of the feet or legs  swollen lymph glands  unusual weakness Side effects that usually do not require medical attention (report to your doctor or health care professional if they continue or are bothersome):  diarrhea, or constipation  headache  loss of  appetite  nausea, vomiting This list may not describe all possible side effects. Call your doctor for medical advice about side effects. You may report side effects to FDA at 1-800-FDA-1088. Where should I keep my medicine? Keep out of the reach of children. Store at room temperature between 15 and 30 degrees C (59 and 86 degrees F). Throw away any unused medicine after the expiration date. NOTE: This sheet is a summary. It may not cover all possible information. If you have questions about this medicine, talk to your doctor, pharmacist, or health care provider.  2020 Elsevier/Gold Standard (2007-06-17 15:44:58)

## 2018-11-02 ENCOUNTER — Emergency Department (HOSPITAL_COMMUNITY)
Admission: EM | Admit: 2018-11-02 | Discharge: 2018-11-02 | Disposition: A | Discharge status: Home / Self Care | Payer: 59 | Attending: Emergency Medicine | Admitting: Emergency Medicine

## 2018-11-02 ENCOUNTER — Encounter (HOSPITAL_COMMUNITY): Payer: Self-pay | Admitting: Emergency Medicine

## 2018-11-02 ENCOUNTER — Emergency Department (HOSPITAL_COMMUNITY): Payer: 59

## 2018-11-02 ENCOUNTER — Other Ambulatory Visit: Payer: Self-pay

## 2018-11-02 DIAGNOSIS — R079 Chest pain, unspecified: Secondary | ICD-10-CM | POA: Diagnosis not present

## 2018-11-02 DIAGNOSIS — I10 Essential (primary) hypertension: Secondary | ICD-10-CM | POA: Insufficient documentation

## 2018-11-02 DIAGNOSIS — R0789 Other chest pain: Secondary | ICD-10-CM | POA: Insufficient documentation

## 2018-11-02 DIAGNOSIS — Z79899 Other long term (current) drug therapy: Secondary | ICD-10-CM | POA: Diagnosis not present

## 2018-11-02 LAB — HEPATIC FUNCTION PANEL
ALT: 40 U/L (ref 0–44)
AST: 58 U/L — ABNORMAL HIGH (ref 15–41)
Albumin: 2.9 g/dL — ABNORMAL LOW (ref 3.5–5.0)
Alkaline Phosphatase: 127 U/L — ABNORMAL HIGH (ref 38–126)
Bilirubin, Direct: 0.2 mg/dL (ref 0.0–0.2)
Indirect Bilirubin: 0.6 mg/dL (ref 0.3–0.9)
Total Bilirubin: 0.8 mg/dL (ref 0.3–1.2)
Total Protein: 4.7 g/dL — ABNORMAL LOW (ref 6.5–8.1)

## 2018-11-02 LAB — BASIC METABOLIC PANEL
Anion gap: 7 (ref 5–15)
BUN: 26 mg/dL — ABNORMAL HIGH (ref 6–20)
CO2: 23 mmol/L (ref 22–32)
Calcium: 7.8 mg/dL — ABNORMAL LOW (ref 8.9–10.3)
Chloride: 106 mmol/L (ref 98–111)
Creatinine, Ser: 1.26 mg/dL — ABNORMAL HIGH (ref 0.61–1.24)
GFR calc Af Amer: >60 mL/min (ref >60)
GFR calc non Af Amer: >60 mL/min (ref >60)
Glucose, Bld: 114 mg/dL — ABNORMAL HIGH (ref 70–99)
Potassium: 4.1 mmol/L (ref 3.5–5.1)
Sodium: 136 mmol/L (ref 135–145)

## 2018-11-02 LAB — TROPONIN I (HIGH SENSITIVITY)
Troponin I (High Sensitivity): 13 ng/L (ref <18)
Troponin I (High Sensitivity): 11 ng/L (ref <18)

## 2018-11-02 LAB — CBC
HCT: 37.9 % — ABNORMAL LOW (ref 39.0–52.0)
Hemoglobin: 12.9 g/dL — ABNORMAL LOW (ref 13.0–17.0)
MCH: 30 pg (ref 26.0–34.0)
MCHC: 34 g/dL (ref 30.0–36.0)
MCV: 88.1 fL (ref 80.0–100.0)
Platelets: 132 10*3/uL — ABNORMAL LOW (ref 150–400)
RBC: 4.3 MIL/uL (ref 4.22–5.81)
RDW: 14.9 % (ref 11.5–15.5)
WBC: 4.5 10*3/uL (ref 4.0–10.5)
nRBC: 0 % (ref 0.0–0.2)

## 2018-11-02 LAB — BRAIN NATRIURETIC PEPTIDE: B Natriuretic Peptide: 117.9 pg/mL — ABNORMAL HIGH (ref 0.0–100.0)

## 2018-11-02 MED ORDER — HYDROMORPHONE HCL 1 MG/ML IJ SOLN
1.0000 mg | Freq: Once | INTRAMUSCULAR | Status: AC
  Administered 2018-11-02: 1 mg via INTRAVENOUS
  Filled 2018-11-02: qty 1

## 2018-11-02 MED ORDER — OXYCODONE HCL 5 MG PO TABS: 5.0000 mg | ORAL_TABLET | Freq: Two times a day (BID) | ORAL | 0 refills | Status: DC | PRN

## 2018-11-02 MED ORDER — ONDANSETRON HCL 4 MG/2ML IJ SOLN
4.0000 mg | Freq: Once | INTRAMUSCULAR | Status: AC
  Administered 2018-11-02: 15:00:00 4 mg via INTRAVENOUS
  Filled 2018-11-02: qty 2

## 2018-11-02 MED ORDER — SODIUM CHLORIDE 0.9% FLUSH: 3.0000 mL | Freq: Once | INTRAVENOUS | Status: DC

## 2018-11-02 MED ORDER — IOHEXOL 350 MG/ML SOLN
100.0000 mL | Freq: Once | INTRAVENOUS | Status: AC | PRN
  Administered 2018-11-02: 100 mL via INTRAVENOUS

## 2018-11-02 MED ORDER — PROMETHAZINE HCL 25 MG/ML IJ SOLN
25.0000 mg | Freq: Once | INTRAMUSCULAR | Status: AC
  Administered 2018-11-02: 25 mg via INTRAVENOUS
  Filled 2018-11-02: qty 1

## 2018-11-02 NOTE — ED Triage Notes (Signed)
Pt c/o sudden onset crushing chest pain that started at 0400 this morning. Reports that he recently had his blood pressure medication changed. C/o BLE swelling and hand swelling x "weeks". States he was recently taken off his fluid pill as well.

## 2018-11-02 NOTE — ED Provider Notes (Signed)
[facility] EMERGENCY DEPARTMENT Provider Note   CSN: [id number] Arrival date & time: 11/02/18  0554     History   Chief Complaint Chief Complaint  Patient presents with  . Chest Pain    HPI Brian [name] is a 43 y.o. male with PMH significant for metastatic thyroid cancer, hypertension, and GERD who presents to the ER with a sudden onset sharp chest pain that woke him from his sleep at 4 AM this morning.  He states that the pain was initially sharp, 8/10 radiating up into left side of his neck, but now is 7 out of 10, dull "squeezing" and no longer radiates to the neck but rather between his shoulder blades. He reports that it is not unusual for him to experience left-sided chest pain when his BP is elevated like it is today, but states that it currently is worse than usual.  Recently he has been experiencing dyspnea on exertion and a 10 pound weight gain in the past few weeks along with bilateral leg swelling which he attributes to his amlodipine which has since been recently changed to HCTZ.  He still reports feeling dehydrated with diminished PO fluid intake and is not urinating very often. He also complains of nausea, but states that this is an ongoing issue for him with chemo and is nothing new or unusual. Patient reports taking oxycodone and Zofran around 430 AM.   Patient denies any fevers, shortness of breath, abdominal pain, vomiting, diaphoresis, ataxia, blurred vision, dizziness, or changes in bowel habits.     HPI  Past Medical History:  Diagnosis Date  . Essential hypertension 09/09/2018  . Kidney stone   . Primary cancer of thyroid with metastasis to other site ([place]) 05/04/2018  . Rickettsia infection     Patient Active Problem List   Diagnosis Date Noted  . Essential hypertension 09/09/2018  . Primary cancer of thyroid with metastasis to other site ([place]) 05/04/2018  . Extrasystole 04/26/2018  . Dyspnea on exertion 04/26/2018  . Atypical chest pain  04/26/2018  . Ureteral stone with hydronephrosis 02/15/2018  . Right thyroid nodule 04/09/2017  . Bilateral knee pain 07/01/2016  . Left foot pain 02/06/2016  . Gastroesophageal reflux disease without esophagitis 05/08/2014  . Thrombocytopenia, unspecified ([place]) 06/13/2013  . 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 Low back pain 05/12/2012  . Allergic rhinitis 06/03/2009    Past Surgical History:  Procedure Laterality Date  . BIOPSY  04/05/2018   Procedure: BIOPSY;  Surgeon: Ronald Lobo, MD;  Location: [facility];  Service: Endoscopy;;  . CHOLECYSTECTOMY    . ESOPHAGOGASTRODUODENOSCOPY (EGD) WITH PROPOFOL N/A 04/05/2018   Procedure: ESOPHAGOGASTRODUODENOSCOPY (EGD) WITH PROPOFOL;  Surgeon: Ronald Lobo, MD;  Location: [facility];  Service: Endoscopy;  Laterality: N/A;  . IR RADIOLOGIST EVAL & MGMT  10/11/2018        Home Medications    Prior to Admission medications   Medication Sig Start Date End Date Taking? Authorizing Provider  cloNIDine (CATAPRES) 0.2 MG tablet Take 1 tablet (0.2 mg total) by mouth 2 (two) times daily. 09/19/18  Yes Ennever, Rudell Cobb, MD  EPINEPHrine (EPIPEN 2-PAK) 0.3 mg/0.3 mL IJ SOAJ injection USE AS DIRECTED FOR LIFE THREATENING ALLERGIC REACTIONS Patient taking differently: Inject 0.3 mg into the muscle once as needed for anaphylaxis.  04/30/15  Yes Kozlow, Donnamarie Poag, MD  hydrALAZINE (APRESOLINE) 25 MG tablet Take 1 tablet (25 mg total) by mouth 3 (three) times daily. 11/01/18 01/30/19 Yes Loel Dubonnet, NP  LENVIMA, 24 MG DAILY DOSE, 2 x 10 MG & 4 MG capsule Take 24 mg by mouth at bedtime.  10/03/18  Yes [provider]  levothyroxine (SYNTHROID) 200 MCG tablet Take 200 mcg by mouth daily before breakfast.  06/08/18  Yes [provider]  ondansetron (ZOFRAN) 8 MG tablet Take 8 mg by mouth every 8 (eight) hours as needed for nausea or vomiting.  07/29/18  Yes [provider]  oxyCODONE (OXY IR/ROXICODONE) 5 MG immediate release  tablet Take 1 tablet (5 mg total) by mouth every 6 (six) hours as needed for severe pain. 09/05/18  Yes [name], MD  promethazine (PHENERGAN) 12.5 MG tablet Take 1 tablet (12.5 mg total) by mouth at bedtime. 09/05/18  Yes [name], MD  oxyCODONE (OXY IR/ROXICODONE) 5 MG immediate release tablet Take 1 tablet (5 mg total) by mouth 2 (two) times daily as needed for severe pain. 11/02/18   Corena Herter, PA-C    Family History Family History  Problem Relation Age of Onset  . Diabetes Mother   . Heart disease Mother   . Heart failure Mother   . Heart attack Mother   . Hyperlipidemia Mother   . Hypertension Mother   . Hyperlipidemia Father   . Allergic rhinitis Father   . Rashes / Skin problems Father   . Sudden death Neg Hx   . Thyroid disease Neg Hx     Social History Social History   Tobacco Use  . Smoking status: Never Smoker  . Smokeless tobacco: Never Used  Substance Use Topics  . Alcohol use: No  . Drug use: No     Allergies   Dextromethorphan-guaifenesin, Doxycycline, Gadobutrol, Gadolinium derivatives, Guaifenesin, Ibuprofen, Lisinopril, Pantoprazole, and Tramadol   Review of Systems Review of Systems  All other systems reviewed and are negative.    Physical Exam Updated Vital Signs BP (!) 167/103   Pulse (!) 53   Temp 98 F (36.7 C) (Oral)   Resp 15   SpO2 99%   Physical Exam Constitutional:      Appearance: Normal appearance.     Comments: Appears cold  HENT:     Head: Normocephalic and atraumatic.  Eyes:     General: No scleral icterus.    Extraocular Movements: Extraocular movements intact.     Conjunctiva/sclera: Conjunctivae normal.     Pupils: Pupils are equal, round, and reactive to light.  Neck:     Musculoskeletal: Normal range of motion. No neck rigidity.  Cardiovascular:     Rate and Rhythm: Normal rate and regular rhythm.     Pulses: Normal pulses.  Pulmonary:     Effort: Pulmonary effort is normal. No respiratory  distress.     Breath sounds: Normal breath sounds. No wheezing.  Chest:     Chest wall: No tenderness.  Abdominal:     General: Bowel sounds are normal. There is no distension.     Palpations: Abdomen is soft.     Tenderness: There is no abdominal tenderness. There is no guarding.  Musculoskeletal:     Comments: Lower leg swelling bilaterally, 1+ pitting, no erythema.  Skin:    Capillary Refill: Capillary refill takes less than 2 seconds.  Neurological:     General: No focal deficit present.     Mental Status: He is alert and oriented to person, place, and time.     GCS: GCS eye subscore is 4. GCS verbal subscore is 5. GCS motor subscore is 6.  Cranial Nerves: No cranial nerve deficit.     Sensory: No sensory deficit.     Coordination: Coordination normal.  Psychiatric:        Mood and Affect: Mood normal.        Behavior: Behavior normal.        Thought Content: Thought content normal.      ED Treatments / Results  Labs (all labs ordered are listed, but only abnormal results are displayed) Labs Reviewed  BASIC METABOLIC PANEL - Abnormal; Notable for the following components:      Result Value   Glucose, Bld 114 (*)    BUN 26 (*)    Creatinine, Ser 1.26 (*)    Calcium 7.8 (*)    All other components within normal limits  CBC - Abnormal; Notable for the following components:   Hemoglobin 12.9 (*)    HCT 37.9 (*)    Platelets 132 (*)    All other components within normal limits  HEPATIC FUNCTION PANEL - Abnormal; Notable for the following components:   Total Protein 4.7 (*)    Albumin 2.9 (*)    AST 58 (*)    Alkaline Phosphatase 127 (*)    All other components within normal limits  BRAIN NATRIURETIC PEPTIDE - Abnormal; Notable for the following components:   B Natriuretic Peptide 117.9 (*)    All other components within normal limits  TROPONIN I (HIGH SENSITIVITY)  TROPONIN I (HIGH SENSITIVITY)    EKG EKG Interpretation  Date/Time:  Wednesday November 02 2018 06:06:01 EDT Ventricular Rate:  62 PR Interval:    QRS Duration: 94 QT Interval:  448 QTC Calculation: 455 R Axis:   27 Text Interpretation:  Sinus rhythm Baseline wander in lead(s) V5 similar to prior 8/20 Confirmed by Aletta Edouard [phone]) on 11/02/2018 9:39:00 AM   Radiology Dg Chest 2 View  Result Date: 11/02/2018 CLINICAL DATA:  Chest pain beginning this morning. EXAM: CHEST - 2 VIEW COMPARISON:  11/01/2018 and 05/16/2017 FINDINGS: Lungs are adequately inflated with minimal stable left base density likely scarring/atelectasis. No effusion or pneumothorax. Cardiomediastinal silhouette and remainder of the exam is unchanged. IMPRESSION: No acute cardiopulmonary disease. Minimal stable left basilar scarring/atelectasis. Electronically Signed   By: Marin Olp M.D.   On: 11/02/2018 07:12   Dg Chest 2 View  Result Date: 11/01/2018 CLINICAL DATA:  Sob since yesterday, pale. Takes oral chemo daily. EXAM: CHEST - 2 VIEW COMPARISON:  Chest radiograph 05/16/2017 FINDINGS: Stable cardiomediastinal contours. Minimal linear opacities at the left lung base similar to prior likely atelectasis or scarring. No new focal infiltrate. No pneumothorax or pleural effusion. No acute finding in the visualized skeleton. IMPRESSION: Minimal atelectasis or scarring at the left lung base, similar to prior. No acute finding. Electronically Signed   By: Audie Pinto M.D.   On: 11/01/2018 14:07   Ct Angio Chest Pe W And/or Wo Contrast  Result Date: 11/02/2018 CLINICAL DATA:  Chest pain. EXAM: CT ANGIOGRAPHY CHEST WITH CONTRAST TECHNIQUE: Multidetector CT imaging of the chest was performed using the standard protocol during bolus administration of intravenous contrast. Multiplanar CT image reconstructions and MIPs were obtained to evaluate the vascular anatomy. CONTRAST:  183mL OMNIPAQUE IOHEXOL 350 MG/ML SOLN COMPARISON:  Radiographs of same day. CT scan of September 18, 2018. MRI of June 11, 2018. FINDINGS:  Cardiovascular: Satisfactory opacification of the pulmonary arteries to the segmental level. No evidence of pulmonary embolism. Normal heart size. No pericardial effusion. Mediastinum/Nodes: Esophagus and thyroid gland are  unremarkable. Stable 10 mm right paratracheal lymph node is noted. No other adenopathy is noted. Lungs/Pleura: Lungs are clear. No pleural effusion or pneumothorax. Upper Abdomen: 3 cm low density is noted in right hepatic lobe consistent with metastatic disease as described on prior MRI exam. Musculoskeletal: No chest wall abnormality. No acute or significant osseous findings. Review of the MIP images confirms the above findings. IMPRESSION: No definite evidence of pulmonary embolus. Stable right paratracheal lymph node is noted. Stable 3 cm rounded low density seen in right hepatic lobe consistent with metastatic disease as described on prior MRI. Electronically Signed   By: Marijo Conception M.D.   On: 11/02/2018 12:39    Procedures Procedures (including critical care time)  Medications Ordered in ED Medications  sodium chloride flush (NS) 0.9 % injection 3 mL (3 mLs Intravenous Not Given 11/02/18 1240)  promethazine (PHENERGAN) injection 25 mg (25 mg Intravenous Given 11/02/18 1025)  HYDROmorphone (DILAUDID) injection 1 mg (1 mg Intravenous Given 11/02/18 1025)  iohexol (OMNIPAQUE) 350 MG/ML injection 100 mL (100 mLs Intravenous Contrast Given 11/02/18 1225)  ondansetron (ZOFRAN) injection 4 mg (4 mg Intravenous Given 11/02/18 1504)  HYDROmorphone (DILAUDID) injection 1 mg (1 mg Intravenous Given 11/02/18 1504)     Initial Impression / Assessment and Plan / ED Course  I have reviewed the triage vital signs and the nursing notes.  Pertinent labs & imaging results that were available during my care of the patient were reviewed by me and considered in my medical decision making (see chart for details).  Clinical Course as of Nov 01 1633  Wed Nov 01, 6244  [id number] 43 year old male  with history of thyroid cancer metastatic here with chest pain sharp and shortness of breath and elevated blood pressures.  He just saw cardiology yesterday and they have adjusted his blood pressure medicine because he seems to be very sensitive to the diuretic part but has peripheral edema.  He appears moderately uncomfortable although his sats are 100%.  His lab work does not show any significant changes from his baseline and his CT does not show a PE or gross dissection.  We will continue to work on pain control and blood pressure management.   [MB]    Clinical Course User Index [MB] Hayden Rasmussen, MD       Do not suspect STEMI or NSTEMI given normal EKG and troponin levels. Patient's blood work was unremarkable except for a slightly elevated BUN and creatinine which is consistent with his reports of dehydration and improved from yesterday.    Patient does complain of worsening dyspnea on exertion and has bilateral leg swelling so obtained a BNP which was barely elevated and not anything particularly urgent. He does see a cardiologist and has an echocardiogram scheduled this month.   No reports of hemoptysis or melena and do not suspect gastric ulcer. Hgb is improved from yesterday's blood work is also reassuring.   Hepatic function panel was also unimpressive and I do not suspect liver or gallbladder etiology for his symptoms.  Denies any recent trauma or increased physical activity and pain is not reproducible on exam, so do not suspect musculoskeletal etiology.   Considered hypertensive urgency/emergency, but patient's BP was able to trend down during duration of stay without having taken his medications. He will take them when he gets home. Denies any neurologic deficits. Patient being managed by cardiology and oncology and recently had BP medications adjusted. Will f/u with cardiologist tonight.  Ordered CTA chest given  patient's metastatic cancer and potential hypercoagulability  which was negative for pulmonary embolism, dissection, or other acute cardiopulmonary disease.   Will discharge patient with strict return precautions.  He will follow-up with Dr. Raliegh Ip from cardiology tonight and then he sees oncology on Friday.  Will supplement his Oxycodone pain medication to hold him over until Friday. Patient and wife voiced understanding and is agreeable to plan.   Final Clinical Impressions(s) / ED Diagnoses   Final diagnoses:  Chest pain, unspecified type    ED Discharge Orders         Ordered    oxyCODONE (OXY IR/ROXICODONE) 5 MG immediate release tablet  2 times daily PRN     11/02/18 1624           Corena Herter, PA-C 11/02/18 1637    Hayden Rasmussen, MD 11/02/18 1730

## 2018-11-02 NOTE — ED Notes (Signed)
PT is on chemo with new onset weakness.

## 2018-11-02 NOTE — Discharge Instructions (Signed)
Follow-up with your cardiologist, Dr. Raliegh Ip, as well as your PCP. Continue with your oncology appointment on Friday. Please return to ER for any new or worsening symptoms. Do not drive or drink alcohol after taking Oxycodone.

## 2018-11-03 ENCOUNTER — Telehealth: Payer: Self-pay | Admitting: *Deleted

## 2018-11-03 NOTE — Telephone Encounter (Signed)
Went to [facility] ED for excruciating chest pain. Need to see Dr. Raliegh Ip for BP per [facility]

## 2018-11-04 ENCOUNTER — Other Ambulatory Visit: Payer: Self-pay

## 2018-11-04 ENCOUNTER — Inpatient Hospital Stay: Payer: 59

## 2018-11-04 ENCOUNTER — Inpatient Hospital Stay (HOSPITAL_BASED_OUTPATIENT_CLINIC_OR_DEPARTMENT_OTHER): Payer: 59 | Admitting: Hematology & Oncology

## 2018-11-04 ENCOUNTER — Other Ambulatory Visit: Payer: Self-pay | Admitting: Family

## 2018-11-04 ENCOUNTER — Encounter: Payer: Self-pay | Admitting: Hematology & Oncology

## 2018-11-04 ENCOUNTER — Ambulatory Visit: Payer: 59 | Admitting: Cardiology

## 2018-11-04 VITALS — BP 168/104 | HR 71 | Temp 97.1°F | Resp 18 | Wt 265.0 lb

## 2018-11-04 VITALS — BP 186/119 | HR 71 | Temp 97.3°F | Resp 20

## 2018-11-04 DIAGNOSIS — M7989 Other specified soft tissue disorders: Secondary | ICD-10-CM | POA: Diagnosis not present

## 2018-11-04 DIAGNOSIS — C787 Secondary malignant neoplasm of liver and intrahepatic bile duct: Secondary | ICD-10-CM | POA: Diagnosis not present

## 2018-11-04 DIAGNOSIS — I10 Essential (primary) hypertension: Secondary | ICD-10-CM

## 2018-11-04 DIAGNOSIS — C73 Malignant neoplasm of thyroid gland: Secondary | ICD-10-CM

## 2018-11-04 DIAGNOSIS — R0789 Other chest pain: Secondary | ICD-10-CM | POA: Diagnosis not present

## 2018-11-04 DIAGNOSIS — R609 Edema, unspecified: Secondary | ICD-10-CM | POA: Diagnosis not present

## 2018-11-04 DIAGNOSIS — E890 Postprocedural hypothyroidism: Secondary | ICD-10-CM | POA: Diagnosis not present

## 2018-11-04 DIAGNOSIS — R519 Headache, unspecified: Secondary | ICD-10-CM

## 2018-11-04 MED ORDER — OXYCODONE HCL 5 MG PO TABS: 5.0000 mg | ORAL_TABLET | Freq: Four times a day (QID) | ORAL | 0 refills | Status: DC | PRN

## 2018-11-04 MED ORDER — SODIUM CHLORIDE 0.9 % IV SOLN
INTRAVENOUS | Status: AC
  Administered 2018-11-04: 11:00:00 via INTRAVENOUS
  Filled 2018-11-04: qty 250

## 2018-11-04 MED ORDER — PROMETHAZINE HCL 25 MG/ML IJ SOLN
INTRAMUSCULAR | Status: AC
  Filled 2018-11-04: qty 1

## 2018-11-04 MED ORDER — HYDROMORPHONE HCL 4 MG/ML IJ SOLN
INTRAMUSCULAR | Status: AC
  Filled 2018-11-04: qty 1

## 2018-11-04 MED ORDER — MORPHINE SULFATE 4 MG/ML IJ SOLN
4.0000 mg | INTRAMUSCULAR | Status: DC | PRN
  Administered 2018-11-04: 4 mg via INTRAVENOUS
  Filled 2018-11-04: qty 1

## 2018-11-04 MED ORDER — HYDROMORPHONE HCL 1 MG/ML IJ SOLN
2.0000 mg | Freq: Once | INTRAMUSCULAR | Status: AC
  Administered 2018-11-04: 16:00:00 2 mg via INTRAVENOUS

## 2018-11-04 MED ORDER — ISOSORBIDE MONONITRATE 20 MG PO TABS: 20.0000 mg | ORAL_TABLET | Freq: Two times a day (BID) | ORAL | 1 refills | Status: DC

## 2018-11-04 MED ORDER — HYDROMORPHONE HCL 1 MG/ML IJ SOLN
INTRAMUSCULAR | Status: AC
  Filled 2018-11-04: qty 2

## 2018-11-04 MED ORDER — METOLAZONE 5 MG PO TABS
5.0000 mg | ORAL_TABLET | Freq: Once | ORAL | Status: AC
  Administered 2018-11-04: 5 mg via ORAL
  Filled 2018-11-04: qty 1

## 2018-11-04 MED ORDER — MORPHINE SULFATE (PF) 4 MG/ML IV SOLN
INTRAVENOUS | Status: AC
  Filled 2018-11-04: qty 1

## 2018-11-04 MED ORDER — FUROSEMIDE 10 MG/ML IJ SOLN
60.0000 mg | Freq: Once | INTRAMUSCULAR | Status: AC
  Administered 2018-11-04: 60 mg via INTRAVENOUS

## 2018-11-04 MED ORDER — HYDROMORPHONE HCL 4 MG/ML IJ SOLN
2.0000 mg | Freq: Once | INTRAMUSCULAR | Status: AC
  Administered 2018-11-04: 2 mg via INTRAVENOUS

## 2018-11-04 MED ORDER — PROMETHAZINE HCL 25 MG/ML IJ SOLN
25.0000 mg | Freq: Once | INTRAMUSCULAR | Status: AC
  Administered 2018-11-04: 25 mg via INTRAVENOUS

## 2018-11-04 MED ORDER — SODIUM CHLORIDE 0.9 % IV SOLN
60.0000 mg | Freq: Once | INTRAVENOUS | Status: AC
  Administered 2018-11-04: 60 mg via INTRAVENOUS
  Filled 2018-11-04: qty 6

## 2018-11-04 NOTE — Progress Notes (Signed)
Orders to refill Oxycodone and new order for Imdur 20 mg PO BID placed per Dr. Marin Olp request. Patient aware.

## 2018-11-04 NOTE — Patient Instructions (Signed)
Dehydration, Adult  Dehydration is when there is not enough fluid or water in your body. This happens when you lose more fluids than you take in. Dehydration can range from mild to very bad. It should be treated right away to keep it from getting very bad. Symptoms of mild dehydration may include:  Thirst.  Dry lips.  Slightly dry mouth.  Dry, warm skin.  Dizziness. Symptoms of moderate dehydration may include:  Very dry mouth.  Muscle cramps.  Dark pee (urine). Pee may be the color of tea.  Your body making less pee.  Your eyes making fewer tears.  Heartbeat that is uneven or faster than normal (palpitations).  Headache.  Light-headedness, especially when you stand up from sitting.  Fainting (syncope). Symptoms of very bad dehydration may include:  Changes in skin, such as: ? Cold and clammy skin. ? Blotchy (mottled) or pale skin. ? Skin that does not quickly return to normal after being lightly pinched and let go (poor skin turgor).  Changes in body fluids, such as: ? Feeling very thirsty. ? Your eyes making fewer tears. ? Not sweating when body temperature is high, such as in hot weather. ? Your body making very little pee.  Changes in vital signs, such as: ? Weak pulse. ? Pulse that is more than 100 beats a minute when you are sitting still. ? Fast breathing. ? Low blood pressure.  Other changes, such as: ? Sunken eyes. ? Cold hands and feet. ? Confusion. ? Lack of energy (lethargy). ? Trouble waking up from sleep. ? Short-term weight loss. ? Unconsciousness. Follow these instructions at home:   If told by your doctor, drink an ORS: ? Make an ORS by using instructions on the package. ? Start by drinking small amounts, about  cup (120 mL) every 5-10 minutes. ? Slowly drink more until you have had the amount that your doctor said to have.  Drink enough clear fluid to keep your pee clear or pale yellow. If you were told to drink an ORS, finish the  ORS first, then start slowly drinking clear fluids. Drink fluids such as: ? Water. Do not drink only water by itself. Doing that can make the salt (sodium) level in your body get too low (hyponatremia). ? Ice chips. ? Fruit juice that you have added water to (diluted). ? Low-calorie sports drinks.  Avoid: ? Alcohol. ? Drinks that have a lot of sugar. These include high-calorie sports drinks, fruit juice that does not have water added, and soda. ? Caffeine. ? Foods that are greasy or have a lot of fat or sugar.  Take over-the-counter and prescription medicines only as told by your doctor.  Do not take salt tablets. Doing that can make the salt level in your body get too high (hypernatremia).  Eat foods that have minerals (electrolytes). Examples include bananas, oranges, potatoes, tomatoes, and spinach.  Keep all follow-up visits as told by your doctor. This is important. Contact a doctor if:  You have belly (abdominal) pain that: ? Gets worse. ? Stays in one area (localizes).  You have a rash.  You have a stiff neck.  You get angry or annoyed more easily than normal (irritability).  You are more sleepy than normal.  You have a harder time waking up than normal.  You feel: ? Weak. ? Dizzy. ? Very thirsty.  You have peed (urinated) only a small amount of very dark pee during 6-8 hours. Get help right away if:  You have   symptoms of very bad dehydration.  You cannot drink fluids without throwing up (vomiting).  Your symptoms get worse with treatment.  You have a fever.  You have a very bad headache.  You are throwing up or having watery poop (diarrhea) and it: ? Gets worse. ? Does not go away.  You have blood or something green (bile) in your throw-up.  You have blood in your poop (stool). This may cause poop to look black and tarry.  You have not peed in 6-8 hours.  You pass out (faint).  Your heart rate when you are sitting still is more than 100 beats a  minute.  You have trouble breathing. This information is not intended to replace advice given to you by your health care provider. Make sure you discuss any questions you have with your health care provider. Document Released: 11/29/2008 Document Revised: 01/15/2017 Document Reviewed: 03/29/2015 Elsevier Patient Education  2020 [employer]  

## 2018-11-04 NOTE — Progress Notes (Signed)
Hematology and Oncology Follow Up Visit  Brian [name] [id number] [birth year] 43 y.o. 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  He really is having problems.  He really looks rough today.  He comes in in a wheelchair.  He was in the emergency room a couple days ago.  He was having a lot of chest discomfort.  He underwent a CT angiogram.  There is no pulmonary emboli.  The paratracheal lymph node on the right had decreased in size to 10 mmm.  His blood pressure has been incredibly difficult to control.  He has had a lot of swelling in his legs and hands because of his blood pressure medications.  His amlodipine was discontinued.  His blood pressure today was 168/104.  He is gained weight because of fluid retention secondary to his medications.  I told him that we really are going to have to stop the Lenvima right now.  I just think that this might be causing his issues in addition to his medications for blood pressure control.  I do not see a problem with stopping the Lenvima for at least a week.  He is responding to treatment which is nice to see.  He has had no diarrhea.  He has had some constipation.  He has a lot of total body pain right now.  Again I am not sure why he might be having this although he might be from his [place].  He had lab work done a couple days ago.  I really did not see anything that looked unusual.  His white cell count was 4.5.  Hemoglobin 13 and platelet count 132,000.  His albumin was low at 2.9.  His LFTs looked okay.  His creatinine was 1.26.  His potassium was 4.1.  He does look like he is volume overloaded.  He has not really had a much of an appetite.  He has had no vomiting.  I just wish that his quality of life was better right now.  He still sees Dr. Barrington Ellison  at [facility].  I think it is been a while since he saw her.  I will try him on Imdur (20 mg p.o. twice daily) for his blood pressure.  I will stop his hydralazine.  I will know if he is having a reaction to the hydralazine.  He is on clonidine.  Hopefully, stopping the Lenvima is going to help with his blood pressure.  Clinically, his performance status right now is ECOG 2-3.      Medications:  Current Outpatient Medications:  .  cloNIDine (CATAPRES) 0.2 MG tablet, Take 1 tablet (0.2 mg total) by mouth 2 (two) times daily., Disp: 60 tablet, Rfl: 2 .  EPINEPHrine (EPIPEN 2-PAK) 0.3 mg/0.3 mL IJ SOAJ injection, USE AS DIRECTED FOR LIFE THREATENING ALLERGIC REACTIONS (Patient taking differently: Inject 0.3 mg into the muscle once as needed for anaphylaxis. ), Disp: 4 Device, Rfl: 3 .  hydrALAZINE (APRESOLINE) 25 MG tablet, Take 1 tablet (25 mg total) by mouth 3 (three) times daily., Disp: 90 tablet, Rfl: 2 .  LENVIMA, 24 MG DAILY DOSE, 2 x 10 MG & 4 MG capsule, Take 24 mg by mouth at bedtime. , Disp: , Rfl:  .  levothyroxine (SYNTHROID) 200 MCG tablet, Take  200 mcg by mouth daily before breakfast. , Disp: , Rfl:  .  ondansetron (ZOFRAN) 8 MG tablet, Take 8 mg by mouth every 8 (eight) hours as needed for nausea or vomiting. , Disp: , Rfl:  .  oxyCODONE (OXY IR/ROXICODONE) 5 MG immediate release tablet, Take 1 tablet (5 mg total) by mouth every 6 (six) hours as needed for severe pain., Disp: 60 tablet, Rfl: 0 .  oxyCODONE (OXY IR/ROXICODONE) 5 MG immediate release tablet, Take 1 tablet (5 mg total) by mouth 2 (two) times daily as needed for severe pain., Disp: 6 tablet, Rfl: 0 .  promethazine (PHENERGAN) 12.5 MG tablet, Take 1 tablet (12.5 mg total) by mouth at bedtime., Disp: 60 tablet, Rfl: 3  Allergies:  Allergies  Allergen Reactions  . Dextromethorphan-Guaifenesin Other (See Comments)    Irregular heartbeat   . Doxycycline Anaphylaxis, Nausea And Vomiting, Rash and Other (See Comments)    "heart  arrythmia" and "dyspepsia" (only oral doxycycline causes reaction)  . Gadobutrol Hives and Other (See Comments)    Patient had MRI scan at [place]. Patient called one hour after he left imaging facility to report two "blisters" that came up on "back" of lip.    . Gadolinium Derivatives Hives and Other (See Comments)    Patient had MRI scan at [place]. Patient called one hour after he left imaging facility to report two "blisters" that came up on "back" of lip.   . Guaifenesin Palpitations       . Ibuprofen Hives and Itching  . Lisinopril Other (See Comments)    Angioedema  . Pantoprazole Itching  . Tramadol Hives and Itching    Past Medical History, Surgical history, Social history, and Family History were reviewed and updated.  Review of Systems: Review of Systems  Constitutional: Negative.   HENT:   Positive for lump/mass and sore throat.   Eyes: Negative.   Respiratory: Negative.   Cardiovascular: Negative.   Gastrointestinal: Negative.   Endocrine: Negative.   Genitourinary: Negative.    Musculoskeletal: Negative.   Skin: Negative.   Neurological: Negative.   Hematological: Negative.   Psychiatric/Behavioral: Negative.     Physical Exam:  weight is 265 lb (120.2 kg). His temporal temperature is 97.1 F (36.2 C) (abnormal). His blood pressure is 168/104 (abnormal) and his pulse is 71. His respiration is 18 and oxygen saturation is 100%.   Wt Readings from Last 3 Encounters:  11/04/18 265 lb (120.2 kg)  11/01/18 263 lb (119.3 kg)  10/07/18 258 lb 6.4 oz (117.2 kg)    Physical Exam Vitals signs reviewed.  HENT:     Head: Normocephalic and atraumatic.  Eyes:     Pupils: Pupils are equal, round, and reactive to light.  Neck:     Musculoskeletal: Normal range of motion.     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  This is well-healing.  There is some slight fullness but no distinct mass noted in  the neck. Cardiovascular:     Rate and Rhythm: Normal rate and regular rhythm.     Heart sounds: Normal heart sounds.  Pulmonary:     Effort: Pulmonary effort is normal.     Breath sounds: Normal breath sounds.  Abdominal:     General: Bowel sounds are normal.     Palpations: Abdomen is soft.  Musculoskeletal: Normal range of motion.        General: No tenderness or deformity.  Lymphadenopathy:     Cervical: No cervical adenopathy.  Skin:    General: Skin is warm and dry.     Findings: No erythema or rash.  Neurological:     Mental Status: He is alert and oriented to person, place, and time.  Psychiatric:        Behavior: Behavior normal.        Thought Content: Thought content normal.        Judgment: Judgment normal.      Lab Results  Component Value Date   WBC 4.5 11/02/2018   HGB 12.9 (L) 11/02/2018   HCT 37.9 (L) 11/02/2018   MCV 88.1 11/02/2018   PLT 132 (L) 11/02/2018     Chemistry      Component Value Date/Time   NA 136 11/02/2018 0639   K 4.1 11/02/2018 0639   CL 106 11/02/2018 0639   CO2 23 11/02/2018 0639   BUN 26 (H) 11/02/2018 0639   CREATININE 1.26 (H) 11/02/2018 0639   CREATININE 1.38 (H) 11/01/2018 1008      Component Value Date/Time   CALCIUM 7.8 (L) 11/02/2018 0639   ALKPHOS 127 (H) 11/02/2018 0639   AST 58 (H) 11/02/2018 0639   AST 28 11/01/2018 1008   ALT 40 11/02/2018 0639   ALT 24 11/01/2018 1008   BILITOT 0.8 11/02/2018 0639   BILITOT 0.5 11/01/2018 1008      Impression and Plan: Brian [name] is a 43 year old white male.  He has a incredibly unusual metastatic Hurthle cell tumor of the thyroid.  He is being followed by the endocrine malignancy clinic at [facility].    At this point, we really have to work on his quality of life.  It just is not that good.  We will give him some IV medications in the office.  Hopefully this will help him.  Again if we can just his blood pressure under better control, I think he will feel better.  Again  we are going to stop his Lenvima.  I will see a problem with stopping this for a week.  This is incredibly complicated.  We spent almost an hour with him today.  It took quite a while to figure out what was going on and how we can try to help him.  I just feel bad that he feels so rough.  It is troublesome that he is responding to treatment.  However, the treatment might be causing him to have such an issue with his blood pressure and pain and swelling, etc.  I would like to see him back in a week.    Hopefully, he will not end up in the hospital between today and 1 week.  If he does, however, somehow I would not be surprised.    [name], MD 9/18/202010:16 AM

## 2018-11-07 ENCOUNTER — Telehealth: Payer: Self-pay | Admitting: Hematology & Oncology

## 2018-11-07 NOTE — Telephone Encounter (Signed)
Spoke with patient to confirm appts 9/25 per 9/18 LOS

## 2018-11-11 ENCOUNTER — Other Ambulatory Visit: Payer: Self-pay

## 2018-11-11 ENCOUNTER — Inpatient Hospital Stay: Payer: 59

## 2018-11-11 ENCOUNTER — Encounter: Payer: Self-pay | Admitting: Hematology & Oncology

## 2018-11-11 ENCOUNTER — Other Ambulatory Visit: Payer: 59

## 2018-11-11 ENCOUNTER — Inpatient Hospital Stay (HOSPITAL_BASED_OUTPATIENT_CLINIC_OR_DEPARTMENT_OTHER): Payer: 59 | Admitting: Hematology & Oncology

## 2018-11-11 ENCOUNTER — Encounter: Payer: Self-pay | Admitting: *Deleted

## 2018-11-11 ENCOUNTER — Ambulatory Visit (HOSPITAL_BASED_OUTPATIENT_CLINIC_OR_DEPARTMENT_OTHER)
Admission: RE | Admit: 2018-11-11 | Discharge: 2018-11-11 | Disposition: A | Discharge status: Home / Self Care | Payer: 59 | Source: Ambulatory Visit | Attending: Family | Admitting: Family

## 2018-11-11 VITALS — BP 150/107 | HR 64 | Temp 97.7°F | Resp 18 | Wt 261.0 lb

## 2018-11-11 DIAGNOSIS — R600 Localized edema: Secondary | ICD-10-CM | POA: Diagnosis not present

## 2018-11-11 DIAGNOSIS — I10 Essential (primary) hypertension: Secondary | ICD-10-CM | POA: Diagnosis not present

## 2018-11-11 DIAGNOSIS — C73 Malignant neoplasm of thyroid gland: Secondary | ICD-10-CM

## 2018-11-11 DIAGNOSIS — R0609 Other forms of dyspnea: Secondary | ICD-10-CM | POA: Insufficient documentation

## 2018-11-11 LAB — CMP (CANCER CENTER ONLY)
ALT: 14 U/L (ref 0–44)
AST: 20 U/L (ref 15–41)
Albumin: 3.2 g/dL — ABNORMAL LOW (ref 3.5–5.0)
Alkaline Phosphatase: 116 U/L (ref 38–126)
Anion gap: 4 — ABNORMAL LOW (ref 5–15)
BUN: 20 mg/dL (ref 6–20)
CO2: 28 mmol/L (ref 22–32)
Calcium: 9.1 mg/dL (ref 8.9–10.3)
Chloride: 105 mmol/L (ref 98–111)
Creatinine: 1.04 mg/dL (ref 0.61–1.24)
GFR, Est AFR Am: >60 mL/min (ref >60)
GFR, Estimated: >60 mL/min (ref >60)
Glucose, Bld: 89 mg/dL (ref 70–99)
Potassium: 4.5 mmol/L (ref 3.5–5.1)
Sodium: 137 mmol/L (ref 135–145)
Total Bilirubin: 0.3 mg/dL (ref 0.3–1.2)
Total Protein: 5.1 g/dL — ABNORMAL LOW (ref 6.5–8.1)

## 2018-11-11 LAB — CBC WITH DIFFERENTIAL (CANCER CENTER ONLY)
Abs Immature Granulocytes: 0.01 10*3/uL (ref 0.00–0.07)
Basophils Absolute: 0 10*3/uL (ref 0.0–0.1)
Basophils Relative: 1 %
Eosinophils Absolute: 0.2 10*3/uL (ref 0.0–0.5)
Eosinophils Relative: 4 %
HCT: 36.3 % — ABNORMAL LOW (ref 39.0–52.0)
Hemoglobin: 11.7 g/dL — ABNORMAL LOW (ref 13.0–17.0)
Immature Granulocytes: 0 %
Lymphocytes Relative: 26 %
Lymphs Abs: 1.1 10*3/uL (ref 0.7–4.0)
MCH: 29.3 pg (ref 26.0–34.0)
MCHC: 32.2 g/dL (ref 30.0–36.0)
MCV: 90.8 fL (ref 80.0–100.0)
Monocytes Absolute: 0.4 10*3/uL (ref 0.1–1.0)
Monocytes Relative: 10 %
Neutro Abs: 2.5 10*3/uL (ref 1.7–7.7)
Neutrophils Relative %: 59 %
Platelet Count: 200 10*3/uL (ref 150–400)
RBC: 4 MIL/uL — ABNORMAL LOW (ref 4.22–5.81)
RDW: 15.9 % — ABNORMAL HIGH (ref 11.5–15.5)
WBC Count: 4.3 10*3/uL (ref 4.0–10.5)
nRBC: 0 % (ref 0.0–0.2)

## 2018-11-11 LAB — LACTATE DEHYDROGENASE: LDH: 200 U/L — ABNORMAL HIGH (ref 98–192)

## 2018-11-11 LAB — TSH: TSH: 32.984 u[IU]/mL — ABNORMAL HIGH (ref 0.320–4.118)

## 2018-11-11 NOTE — Progress Notes (Signed)
Hematology and Oncology Follow Up Visit  Brian [name] [id number] 06-24-1975 43 y.o. 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  Now will be restarted and tapered it back up.     Interim History:  [name] is back for for follow-up.  He looks a whole lot better.  He feels better.  I am unsure if this was from the stopping the [place].  We adjusted his antihypertensives.  His blood pressure is a little bit better but still on the high side.  He has had no problems with bleeding.  He has had no diarrhea.  He has had no cough.  He has had no headache.  The leg swelling seems to be doing a bit better.  His albumin is only 3.2.  Hopefully he will be able to increase his protein intake so we can get his albumin level up which I think will help with the leg swelling.  Since he stopped the amlodipine, probably will be another several weeks before the leg swelling really improves.  He has had no problems with pain.  He is still working.  He has little bit of discomfort at nighttime.  He will take some pain medication at nighttime which does seem to help.  We are checking his testosterone level to see if this may not be a problem.  He is having no problems with dysphasia or odynophagia.       Medications:  Current Outpatient Medications:  .  cloNIDine (CATAPRES) 0.2 MG tablet, Take 1 tablet (0.2 mg total) by mouth 2 (two) times daily., Disp: 60 tablet, Rfl: 2 .  EPINEPHrine (EPIPEN 2-PAK) 0.3 mg/0.3 mL IJ SOAJ injection, USE AS DIRECTED FOR LIFE THREATENING ALLERGIC REACTIONS (Patient taking differently: Inject 0.3 mg into the muscle once as needed for anaphylaxis. ), Disp: 4 Device, Rfl: 3 .  hydrALAZINE (APRESOLINE) 25 MG tablet, Take 1 tablet (25 mg total) by mouth 3 (three) times daily., Disp: 90 tablet, Rfl: 2 .  isosorbide  mononitrate (ISMO) 20 MG tablet, Take 1 tablet (20 mg total) by mouth 2 (two) times daily at 10 AM and 5 PM., Disp: 60 tablet, Rfl: 1 .  LENVIMA, 24 MG DAILY DOSE, 2 x 10 MG & 4 MG capsule, Take 24 mg by mouth at bedtime. , Disp: , Rfl:  .  levothyroxine (SYNTHROID) 200 MCG tablet, Take 200 mcg by mouth daily before breakfast. , Disp: , Rfl:  .  ondansetron (ZOFRAN) 8 MG tablet, Take 8 mg by mouth every 8 (eight) hours as needed for nausea or vomiting. , Disp: , Rfl:  .  oxyCODONE (OXY IR/ROXICODONE) 5 MG immediate release tablet, Take 1 tablet (5 mg total) by mouth every 6 (six) hours as needed for severe pain., Disp: 120 tablet, Rfl: 0 .  promethazine (PHENERGAN) 12.5 MG tablet, Take 1 tablet (12.5 mg total) by mouth at bedtime., Disp: 60 tablet, Rfl: 3  Allergies:  Allergies  Allergen Reactions  . Dextromethorphan-Guaifenesin Other (See Comments)    Irregular heartbeat   . Doxycycline Anaphylaxis, Nausea And Vomiting, Rash and Other (See Comments)    "heart arrythmia" and "dyspepsia" (only oral doxycycline causes reaction)  . Gadobutrol Hives and Other (See Comments)    Patient had MRI scan at [place]. Patient called one hour after he left imaging facility to  report two "blisters" that came up on "back" of lip.    . Gadolinium Derivatives Hives and Other (See Comments)    Patient had MRI scan at [place]. Patient called one hour after he left imaging facility to report two "blisters" that came up on "back" of lip.   . Guaifenesin Palpitations       . Ibuprofen Hives and Itching  . Lisinopril Other (See Comments)    Angioedema  . Pantoprazole Itching  . Tramadol Hives and Itching    Past Medical History, Surgical history, Social history, and Family History were reviewed and updated.  Review of Systems: Review of Systems  Constitutional: Negative.   HENT:   Positive for lump/mass and sore throat.   Eyes: Negative.   Respiratory: Negative.   Cardiovascular:  Negative.   Gastrointestinal: Negative.   Endocrine: Negative.   Genitourinary: Negative.    Musculoskeletal: Negative.   Skin: Negative.   Neurological: Negative.   Hematological: Negative.   Psychiatric/Behavioral: Negative.     Physical Exam:  weight is 261 lb (118.4 kg). His temporal temperature is 97.7 F (36.5 C). His blood pressure is 150/107 (abnormal) and his pulse is 64. His respiration is 18 and oxygen saturation is 100%.   Wt Readings from Last 3 Encounters:  11/11/18 261 lb (118.4 kg)  11/04/18 265 lb (120.2 kg)  11/01/18 263 lb (119.3 kg)    Physical Exam Vitals signs reviewed.  HENT:     Head: Normocephalic and atraumatic.  Eyes:     Pupils: Pupils are equal, round, and reactive to light.  Neck:     Musculoskeletal: Normal range of motion.     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  This is well-healing.  There is some slight fullness but no distinct mass noted in the neck. Cardiovascular:     Rate and Rhythm: Normal rate and regular rhythm.     Heart sounds: Normal heart sounds.  Pulmonary:     Effort: Pulmonary effort is normal.     Breath sounds: Normal breath sounds.  Abdominal:     General: Bowel sounds are normal.     Palpations: Abdomen is soft.  Musculoskeletal: Normal range of motion.        General: No tenderness or deformity.  Lymphadenopathy:     Cervical: No cervical adenopathy.  Skin:    General: Skin is warm and dry.     Findings: No erythema or rash.  Neurological:     Mental Status: He is alert and oriented to person, place, and time.  Psychiatric:        Behavior: Behavior normal.        Thought Content: Thought content normal.        Judgment: Judgment normal.      Lab Results  Component Value Date   WBC 4.3 11/11/2018   HGB 11.7 (L) 11/11/2018   HCT 36.3 (L) 11/11/2018   MCV 90.8 11/11/2018   PLT 200 11/11/2018     Chemistry      Component Value Date/Time   NA 137  11/11/2018 1009   K 4.5 11/11/2018 1009   CL 105 11/11/2018 1009   CO2 28 11/11/2018 1009   BUN 20 11/11/2018 1009   CREATININE 1.04 11/11/2018 1009      Component Value Date/Time   CALCIUM 9.1 11/11/2018 1009   ALKPHOS 116 11/11/2018 1009   AST 20 11/11/2018 1009   ALT 14 11/11/2018  1009   BILITOT 0.3 11/11/2018 1009      Impression and Plan: [name] is a 43 year old white male.  He has a incredibly unusual metastatic Hurthle cell tumor of the thyroid.  He is being followed by the endocrine malignancy clinic at [facility].    I am happy that he is feeling better.  Hopefully, this will continue.  We will gradually increase his dose of the Lenvima again.  For 1 week, he will take 14 mg daily.  After 1 week, we will increase the Lenvima to 20 mg daily.  After another week, he will go back up to the full dose of 24 mg daily.  We probably not due for scans until November.  Hopefully, we will see that he is responding.  Of note, he had an echocardiogram today.  His left ventricular ejection fraction is 60-65%.  His ventricular function looked okay.  His atrial size was fine.  He had mild valvular aortic regurgitation.  E that he is responding to treatment.  However, the treatment might be causing him to have such an issue with his blood pressure and pain and swelling, etc.  I would like to see him back in 2-3 weeks.   [name], MD 9/25/202011:47 AM

## 2018-11-11 NOTE — Progress Notes (Signed)
  Echocardiogram 2D Echocardiogram has been performed.  Brian [name] 11/11/2018, 9:55 AM

## 2018-11-12 LAB — TESTOSTERONE: Testosterone: 277 ng/dL (ref 264–916)

## 2018-11-14 ENCOUNTER — Encounter: Payer: Self-pay | Admitting: *Deleted

## 2018-11-15 ENCOUNTER — Other Ambulatory Visit: Payer: Self-pay | Admitting: Family

## 2018-11-16 ENCOUNTER — Encounter: Payer: Self-pay | Admitting: Hematology & Oncology

## 2018-11-25 ENCOUNTER — Ambulatory Visit: Payer: 59 | Admitting: Cardiology

## 2018-11-29 ENCOUNTER — Other Ambulatory Visit: Payer: Self-pay

## 2018-11-29 ENCOUNTER — Encounter: Payer: Self-pay | Admitting: Hematology & Oncology

## 2018-11-29 ENCOUNTER — Inpatient Hospital Stay: Payer: 59 | Attending: Hematology & Oncology | Admitting: Hematology & Oncology

## 2018-11-29 ENCOUNTER — Inpatient Hospital Stay: Payer: 59

## 2018-11-29 VITALS — BP 173/124 | HR 54 | Temp 97.5°F | Resp 18 | Ht 70.0 in | Wt 247.0 lb

## 2018-11-29 DIAGNOSIS — C787 Secondary malignant neoplasm of liver and intrahepatic bile duct: Secondary | ICD-10-CM | POA: Insufficient documentation

## 2018-11-29 DIAGNOSIS — C73 Malignant neoplasm of thyroid gland: Secondary | ICD-10-CM | POA: Insufficient documentation

## 2018-11-29 DIAGNOSIS — Z79899 Other long term (current) drug therapy: Secondary | ICD-10-CM | POA: Insufficient documentation

## 2018-11-29 DIAGNOSIS — I10 Essential (primary) hypertension: Secondary | ICD-10-CM | POA: Insufficient documentation

## 2018-11-29 LAB — CBC WITH DIFFERENTIAL (CANCER CENTER ONLY)
Abs Immature Granulocytes: 0.01 10*3/uL (ref 0.00–0.07)
Basophils Absolute: 0 10*3/uL (ref 0.0–0.1)
Basophils Relative: 1 %
Eosinophils Absolute: 0 10*3/uL (ref 0.0–0.5)
Eosinophils Relative: 1 %
HCT: 43.2 % (ref 39.0–52.0)
Hemoglobin: 14.3 g/dL (ref 13.0–17.0)
Immature Granulocytes: 0 %
Lymphocytes Relative: 29 %
Lymphs Abs: 0.9 10*3/uL (ref 0.7–4.0)
MCH: 29.9 pg (ref 26.0–34.0)
MCHC: 33.1 g/dL (ref 30.0–36.0)
MCV: 90.2 fL (ref 80.0–100.0)
Monocytes Absolute: 0.3 10*3/uL (ref 0.1–1.0)
Monocytes Relative: 9 %
Neutro Abs: 1.8 10*3/uL (ref 1.7–7.7)
Neutrophils Relative %: 60 %
Platelet Count: 159 10*3/uL (ref 150–400)
RBC: 4.79 MIL/uL (ref 4.22–5.81)
RDW: 15.2 % (ref 11.5–15.5)
WBC Count: 3.1 10*3/uL — ABNORMAL LOW (ref 4.0–10.5)
nRBC: 0 % (ref 0.0–0.2)

## 2018-11-29 LAB — CMP (CANCER CENTER ONLY)
ALT: 12 U/L (ref 0–44)
AST: 21 U/L (ref 15–41)
Albumin: 3.2 g/dL — ABNORMAL LOW (ref 3.5–5.0)
Alkaline Phosphatase: 79 U/L (ref 38–126)
Anion gap: 3 — ABNORMAL LOW (ref 5–15)
BUN: 19 mg/dL (ref 6–20)
CO2: 34 mmol/L — ABNORMAL HIGH (ref 22–32)
Calcium: 8.8 mg/dL — ABNORMAL LOW (ref 8.9–10.3)
Chloride: 105 mmol/L (ref 98–111)
Creatinine: 1.3 mg/dL — ABNORMAL HIGH (ref 0.61–1.24)
GFR, Est AFR Am: >60 mL/min (ref >60)
GFR, Estimated: >60 mL/min (ref >60)
Glucose, Bld: 110 mg/dL — ABNORMAL HIGH (ref 70–99)
Potassium: 4.8 mmol/L (ref 3.5–5.1)
Sodium: 142 mmol/L (ref 135–145)
Total Bilirubin: 0.7 mg/dL (ref 0.3–1.2)
Total Protein: 4.8 g/dL — ABNORMAL LOW (ref 6.5–8.1)

## 2018-11-29 LAB — LACTATE DEHYDROGENASE: LDH: 211 U/L — ABNORMAL HIGH (ref 98–192)

## 2018-11-29 NOTE — Progress Notes (Signed)
Hematology and Oncology Follow Up Visit  Brian [name] [id number] January 07, 1976 43 y.o. 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  He looks a whole lot better.  He feels better.  His blood pressure continues to be an issue.  He has moderate elevations of both systolic and diastolic blood pressure.  I told him that he can try clonidine 0.4 mg p.o. twice daily.  I will think this is going to be a problem for him.  Yes he is going back to [facility] in November.  He will have his scans done at [facility].  I would like to believe that there will be a response.  His alkaline phosphatase is improving.  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  He is able to work without a lot of problems.  There is no leg swelling anymore.  I think stopping the amlodipine helped with this.  He has had a decent appetite.  He has had no diarrhea.  He has had no urinary issues.  There is been some headache.  This mostly is on the left side.  Had an episode of epistaxis today.  I am sure this is probably from his hypertension.  I must say that his hypertension has been incredibly difficult to control.  He wants to try to avoid adding a new medication.  Hopefully the increase in dose of clonidine in addition to his isosorbide will help control his blood pressure better.  Overall, his performance status is ECOG 1.       Medications:  Current Outpatient Medications:  .  cloNIDine (CATAPRES) 0.2 MG tablet, Take 1 tablet (0.2 mg total) by mouth 2 (two) times daily., Disp: 60 tablet, Rfl: 2 .  isosorbide mononitrate (ISMO) 20 MG tablet, Take 1 tablet (20 mg total) by mouth 2 (two) times daily at 10 AM and 5 PM., Disp: 60 tablet, Rfl: 1 .  LENVIMA, 24 MG DAILY DOSE, 2 x 10 MG & 4 MG capsule, Take 24 mg by mouth at  bedtime. , Disp: , Rfl:  .  levothyroxine (SYNTHROID) 200 MCG tablet, Take 200 mcg by mouth daily before breakfast. , Disp: , Rfl:  .  ondansetron (ZOFRAN) 8 MG tablet, Take 8 mg by mouth every 8 (eight) hours as needed for nausea or vomiting. , Disp: , Rfl:  .  oxyCODONE (OXY IR/ROXICODONE) 5 MG immediate release tablet, Take 1 tablet (5 mg total) by mouth every 6 (six) hours as needed for severe pain., Disp: 120 tablet, Rfl: 0 .  promethazine (PHENERGAN) 12.5 MG tablet, Take 1 tablet (12.5 mg total) by mouth at bedtime., Disp: 60 tablet, Rfl: 3 .  EPINEPHrine (EPIPEN 2-PAK) 0.3 mg/0.3 mL IJ SOAJ injection, USE AS DIRECTED FOR LIFE THREATENING ALLERGIC REACTIONS (Patient not taking: Reported on 11/29/2018), Disp: 4 Device, Rfl: 3  Allergies:  Allergies  Allergen Reactions  . Dextromethorphan-Guaifenesin Other (See Comments)    Irregular heartbeat   . Doxycycline Anaphylaxis, Nausea And Vomiting, Rash and Other (See Comments)    "heart arrythmia" and "dyspepsia" (only oral doxycycline causes reaction)  . Gadobutrol Hives and Other (See Comments)    Patient had MRI scan at [place]. Patient called one hour after he left imaging facility to  report two "blisters" that came up on "back" of lip.    . Gadolinium Derivatives Hives and Other (See Comments)    Patient had MRI scan at [place]. Patient called one hour after he left imaging facility to report two "blisters" that came up on "back" of lip.   . Guaifenesin Palpitations       . Ibuprofen Hives and Itching  . Lisinopril Other (See Comments)    Angioedema  . Pantoprazole Itching  . Tramadol Hives and Itching    Past Medical History, Surgical history, Social history, and Family History were reviewed and updated.  Review of Systems: Review of Systems  Constitutional: Negative.   HENT:   Positive for lump/mass and sore throat.   Eyes: Negative.   Respiratory: Negative.   Cardiovascular: Negative.    Gastrointestinal: Negative.   Endocrine: Negative.   Genitourinary: Negative.    Musculoskeletal: Negative.   Skin: Negative.   Neurological: Negative.   Hematological: Negative.   Psychiatric/Behavioral: Negative.     Physical Exam:  height is 5' 10" (1.778 m) and weight is 247 lb (112 kg). His temporal temperature is 97.5 F (36.4 C) (abnormal). His blood pressure is 173/124 (abnormal) and his pulse is 54 (abnormal). His respiration is 18 and oxygen saturation is 99%.   Wt Readings from Last 3 Encounters:  11/29/18 247 lb (112 kg)  11/11/18 261 lb (118.4 kg)  11/04/18 265 lb (120.2 kg)    Physical Exam Vitals signs reviewed.  HENT:     Head: Normocephalic and atraumatic.  Eyes:     Pupils: Pupils are equal, round, and reactive to light.  Neck:     Musculoskeletal: Normal range of motion.     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  This is well-healing.  There is some slight fullness but no distinct mass noted in the neck. Cardiovascular:     Rate and Rhythm: Normal rate and regular rhythm.     Heart sounds: Normal heart sounds.  Pulmonary:     Effort: Pulmonary effort is normal.     Breath sounds: Normal breath sounds.  Abdominal:     General: Bowel sounds are normal.     Palpations: Abdomen is soft.  Musculoskeletal: Normal range of motion.        General: No tenderness or deformity.  Lymphadenopathy:     Cervical: No cervical adenopathy.  Skin:    General: Skin is warm and dry.     Findings: No erythema or rash.  Neurological:     Mental Status: He is alert and oriented to person, place, and time.  Psychiatric:        Behavior: Behavior normal.        Thought Content: Thought content normal.        Judgment: Judgment normal.      Lab Results  Component Value Date   WBC 3.1 (L) 11/29/2018   HGB 14.3 11/29/2018   HCT 43.2 11/29/2018   MCV 90.2 11/29/2018   PLT 159 11/29/2018     Chemistry       Component Value Date/Time   NA 142 11/29/2018 1124   K 4.8 11/29/2018 1124   CL 105 11/29/2018 1124   CO2 34 (H) 11/29/2018 1124   BUN 19 11/29/2018 1124   CREATININE 1.30 (H) 11/29/2018 1124      Component Value Date/Time   CALCIUM 8.8 (L) 11/29/2018 1124   ALKPHOS 79 11/29/2018 1124  AST 21 11/29/2018 1124   ALT 12 11/29/2018 1124   BILITOT 0.7 11/29/2018 1124      Impression and Plan: Brian [name] is a 43 year old white male.  He has a incredibly unusual metastatic Hurthle cell tumor of the thyroid.  He is being followed by the endocrine malignancy clinic at [facility].    I really will be interesting to see how his scans look when he goes to [facility].  I am sure that he will get very instructive recommendations as to what to do with respect to his medications.  I know that Dr. Barrington Ellison is doing fantastic job with him.  It is nice that we can work together to try to help Brian [name] and help him achieve a good quality of life.   I will plan to get him back to see me after he gets down to [facility].  I want to make sure that we can see what his scans look like.  [name], MD 10/13/202012:46 PM

## 2018-12-02 ENCOUNTER — Ambulatory Visit (INDEPENDENT_AMBULATORY_CARE_PROVIDER_SITE_OTHER): Payer: 59 | Admitting: Cardiology

## 2018-12-02 ENCOUNTER — Other Ambulatory Visit: Payer: Self-pay

## 2018-12-02 ENCOUNTER — Encounter: Payer: Self-pay | Admitting: Cardiology

## 2018-12-02 VITALS — BP 160/90 | HR 47 | Ht 70.0 in | Wt 253.0 lb

## 2018-12-02 DIAGNOSIS — R0789 Other chest pain: Secondary | ICD-10-CM | POA: Diagnosis not present

## 2018-12-02 DIAGNOSIS — I10 Essential (primary) hypertension: Secondary | ICD-10-CM | POA: Diagnosis not present

## 2018-12-02 DIAGNOSIS — I4949 Other premature depolarization: Secondary | ICD-10-CM

## 2018-12-02 DIAGNOSIS — C73 Malignant neoplasm of thyroid gland: Secondary | ICD-10-CM | POA: Diagnosis not present

## 2018-12-02 MED ORDER — LEVOTHYROXINE SODIUM 200 MCG PO TABS: 200.0000 ug | ORAL_TABLET | Freq: Every day | ORAL | 3 refills | Status: DC

## 2018-12-02 NOTE — Progress Notes (Signed)
.  Cardiology Office Note:    Date:  12/02/2018   ID:  [name], DOB Feb 03, 1976, MRN [id number]  PCP:  Sharyne Richters, MD  Cardiologist:  Jenne Campus, MD    Referring MD: Sharyne Richters, MD   Chief Complaint  Patient presents with  . Follow-up  Doing better  History of Present Illness:    Brian [name] is a 43 y.o. male with metastatic cancer, hypertension, palpitations.  I met him first time because of palpitations likely nothing worrisome with discomfort.  In the meantime he developed metastatic cancer treated aggressively with chemotherapy had some difficulty tolerating it and last time when we seen him just few weeks ago he was exhausted tired and washed out today he is doing much better he is in much better spirits overall seems to be doing better.  Denies having a palpitation does have some shortness of breath.  Nothing extraordinary.  We did notice in the medication that he takes isosorbide mononitrate twice a day.  I advised him to take it only once daily.  His blood pressure is elevated today but when he checked it at home it is usually normal between 1 78-2 35 systolic.  Therefore, we will continue present management.  Past Medical History:  Diagnosis Date  . Essential hypertension 09/09/2018  . Kidney stone   . Primary cancer of thyroid with metastasis to other site ([place]) 05/04/2018  . Rickettsia infection     Past Surgical History:  Procedure Laterality Date  . BIOPSY  04/05/2018   Procedure: BIOPSY;  Surgeon: Ronald Lobo, MD;  Location: [facility];  Service: Endoscopy;;  . CHOLECYSTECTOMY    . ESOPHAGOGASTRODUODENOSCOPY (EGD) WITH PROPOFOL N/A 04/05/2018   Procedure: ESOPHAGOGASTRODUODENOSCOPY (EGD) WITH PROPOFOL;  Surgeon: Ronald Lobo, MD;  Location: [facility];  Service: Endoscopy;  Laterality: N/A;  . IR RADIOLOGIST EVAL & MGMT  10/11/2018    Current Medications: Current Meds  Medication Sig  . cloNIDine (CATAPRES) 0.2 MG tablet Take 1 tablet (0.2  mg total) by mouth 2 (two) times daily.  Marland Kitchen EPINEPHrine (EPIPEN 2-PAK) 0.3 mg/0.3 mL IJ SOAJ injection USE AS DIRECTED FOR LIFE THREATENING ALLERGIC REACTIONS  . isosorbide mononitrate (ISMO) 20 MG tablet Take 1 tablet (20 mg total) by mouth 2 (two) times daily at 10 AM and 5 PM.  . LENVIMA, 24 MG DAILY DOSE, 2 x 10 MG & 4 MG capsule Take 24 mg by mouth at bedtime.   Marland Kitchen levothyroxine (SYNTHROID) 200 MCG tablet Take 200 mcg by mouth daily before breakfast.   . ondansetron (ZOFRAN) 8 MG tablet Take 8 mg by mouth every 8 (eight) hours as needed for nausea or vomiting.   Marland Kitchen oxyCODONE (OXY IR/ROXICODONE) 5 MG immediate release tablet Take 1 tablet (5 mg total) by mouth every 6 (six) hours as needed for severe pain.  . promethazine (PHENERGAN) 12.5 MG tablet Take 1 tablet (12.5 mg total) by mouth at bedtime.     Allergies:   Dextromethorphan-guaifenesin, Doxycycline, Gadobutrol, Gadolinium derivatives, Guaifenesin, Ibuprofen, Lisinopril, Pantoprazole, and Tramadol   Social History   Socioeconomic History  . Marital status: Married    Spouse name: Not on file  . Number of children: Not on file  . Years of education: Not on file  . Highest education level: Not on file  Occupational History  . Not on file  Social Needs  . Financial resource strain: Not on file  . Food insecurity    Worry: Not on file  Inability: Not on file  . Transportation needs    Medical: Not on file    Non-medical: Not on file  Tobacco Use  . Smoking status: Never Smoker  . Smokeless tobacco: Never Used  Substance and Sexual Activity  . Alcohol use: No  . Drug use: No  . Sexual activity: Not on file  Lifestyle  . Physical activity    Days per week: Not on file    Minutes per session: Not on file  . Stress: Not on file  Relationships  . Social Herbalist on phone: Not on file    Gets together: Not on file    Attends religious service: Not on file    Active member of club or organization: Not on file     Attends meetings of clubs or organizations: Not on file    Relationship status: Not on file  Other Topics Concern  . Not on file  Social History Narrative  . Not on file     Family History: The patient's family history includes Allergic rhinitis in his father; Diabetes in his mother; Heart attack in his mother; Heart disease in his mother; Heart failure in his mother; Hyperlipidemia in his father and mother; Hypertension in his mother; Rashes / Skin problems in his father. There is no history of Sudden death or Thyroid disease. ROS:   Please see the history of present illness.    All 14 point review of systems negative except as described per history of present illness  EKGs/Labs/Other Studies Reviewed:      Recent Labs: 09/12/2018: Magnesium 2.1 11/02/2018: B Natriuretic Peptide 117.9 11/11/2018: TSH 32.984 11/29/2018: ALT 12; BUN 19; Creatinine 1.30; Hemoglobin 14.3; Platelet Count 159; Potassium 4.8; Sodium 142  Recent Lipid Panel    Component Value Date/Time   CHOL  10/08/2008 0655    119        ATP III CLASSIFICATION:  <200     mg/dL   Desirable  200-239  mg/dL   Borderline High  >=240    mg/dL   High          TRIG 84 10/08/2008 0655   HDL 20 (L) 10/08/2008 0655   CHOLHDL 6.0 10/08/2008 0655   VLDL 17 10/08/2008 0655   LDLCALC  10/08/2008 0655    82        Total Cholesterol/HDL:CHD Risk Coronary Heart Disease Risk Table                     Men   Women  1/2 Average Risk   3.4   3.3  Average Risk       5.0   4.4  2 X Average Risk   9.6   7.1  3 X Average Risk  23.4   11.0        Use the calculated Patient Ratio above and the CHD Risk Table to determine the patient's CHD Risk.        ATP III CLASSIFICATION (LDL):  <100     mg/dL   Optimal  100-129  mg/dL   Near or Above                    Optimal  130-159  mg/dL   Borderline  160-189  mg/dL   High  >190     mg/dL   Very High    Physical Exam:    VS:  BP (!) 160/90   Pulse (!) 47  Ht 5' 10"  (1.778 m)    Wt 253 lb (114.8 kg)   SpO2 98%   BMI 36.30 kg/m     Wt Readings from Last 3 Encounters:  12/02/18 253 lb (114.8 kg)  11/29/18 247 lb (112 kg)  11/11/18 261 lb (118.4 kg)     GEN:  Well nourished, well developed in no acute distress HEENT: Normal NECK: No JVD; No carotid bruits LYMPHATICS: No lymphadenopathy CARDIAC: RRR, no murmurs, no rubs, no gallops RESPIRATORY:  Clear to auscultation without rales, wheezing or rhonchi  ABDOMEN: Soft, non-tender, non-distended MUSCULOSKELETAL:  No edema; No deformity  SKIN: Warm and dry LOWER EXTREMITIES: no swelling NEUROLOGIC:  Alert and oriented x 3 PSYCHIATRIC:  Normal affect   ASSESSMENT:    1. Essential hypertension   2. Extrasystole   3. Primary cancer of thyroid with metastasis to other site ([place])   4. Atypical chest pain    PLAN:    In order of problems listed above:  1. Essential hypertension blood pressure appears to be controlled continue present management. 2. Extrasystole denies having any. 3. Thyroid cancer with metastasis.  Managed aggressively and excellently by oncology team. 4. Atypical chest pain.  Denies having any.   Medication Adjustments/Labs and Tests Ordered: Current medicines are reviewed at length with the patient today.  Concerns regarding medicines are outlined above.  No orders of the defined types were placed in this encounter.  Medication changes: No orders of the defined types were placed in this encounter.   Signed, Park Liter, MD, [facility] 12/02/2018 9:22 AM    [place]

## 2018-12-02 NOTE — Patient Instructions (Signed)
Medication Instructions:  Your physician recommends that you continue on your current medications as directed. Please refer to the Current Medication list given to you today.  *If you need a refill on your cardiac medications before your next appointment, please call your pharmacy*  Lab Work: None Ordered   Testing/Procedures: None Ordered  Follow-Up: At [facility], you and your health needs are our priority.  As part of our continuing mission to provide you with exceptional heart care, we have created designated Provider Care Teams.  These Care Teams include your primary Cardiologist (physician) and Advanced Practice Providers (APPs -  Physician Assistants and Nurse Practitioners) who all work together to provide you with the care you need, when you need it.  Your next appointment:   3 months  The format for your next appointment:   In Person  Provider:   You may see Jenne Campus, MD or the following Advanced Practice Provider on your designated Care Team:    Laurann Montana, FNP   Other Instructions None

## 2018-12-08 ENCOUNTER — Other Ambulatory Visit: Payer: Self-pay | Admitting: Hematology & Oncology

## 2018-12-08 DIAGNOSIS — C73 Malignant neoplasm of thyroid gland: Secondary | ICD-10-CM

## 2018-12-12 ENCOUNTER — Other Ambulatory Visit: Payer: Self-pay | Admitting: Hematology & Oncology

## 2018-12-12 DIAGNOSIS — E890 Postprocedural hypothyroidism: Secondary | ICD-10-CM | POA: Diagnosis not present

## 2018-12-12 DIAGNOSIS — C73 Malignant neoplasm of thyroid gland: Secondary | ICD-10-CM

## 2018-12-12 MED ORDER — CLONIDINE HCL 0.2 MG PO TABS: 0.4000 mg | ORAL_TABLET | Freq: Two times a day (BID) | ORAL | 5 refills | Status: DC

## 2018-12-21 ENCOUNTER — Telehealth: Payer: Self-pay | Admitting: *Deleted

## 2018-12-21 NOTE — Telephone Encounter (Signed)
Received a call from Patient stating that his swelling is back in his legs, ankles, and feet and calves.  BP has been hard to get under control as well.  Appt for lab and Brian [name] made for Thursday.

## 2018-12-22 ENCOUNTER — Inpatient Hospital Stay (HOSPITAL_BASED_OUTPATIENT_CLINIC_OR_DEPARTMENT_OTHER): Payer: 59 | Admitting: Family

## 2018-12-22 ENCOUNTER — Encounter: Payer: Self-pay | Admitting: Family

## 2018-12-22 ENCOUNTER — Inpatient Hospital Stay: Payer: 59 | Attending: Hematology & Oncology

## 2018-12-22 ENCOUNTER — Other Ambulatory Visit: Payer: Self-pay

## 2018-12-22 VITALS — BP 146/114 | HR 76 | Temp 97.0°F | Resp 18 | Wt 253.0 lb

## 2018-12-22 DIAGNOSIS — R600 Localized edema: Secondary | ICD-10-CM | POA: Diagnosis not present

## 2018-12-22 DIAGNOSIS — R5383 Other fatigue: Secondary | ICD-10-CM | POA: Insufficient documentation

## 2018-12-22 DIAGNOSIS — I10 Essential (primary) hypertension: Secondary | ICD-10-CM | POA: Insufficient documentation

## 2018-12-22 DIAGNOSIS — M7989 Other specified soft tissue disorders: Secondary | ICD-10-CM | POA: Insufficient documentation

## 2018-12-22 DIAGNOSIS — Z79899 Other long term (current) drug therapy: Secondary | ICD-10-CM | POA: Insufficient documentation

## 2018-12-22 DIAGNOSIS — R110 Nausea: Secondary | ICD-10-CM | POA: Insufficient documentation

## 2018-12-22 DIAGNOSIS — C787 Secondary malignant neoplasm of liver and intrahepatic bile duct: Secondary | ICD-10-CM | POA: Insufficient documentation

## 2018-12-22 DIAGNOSIS — C73 Malignant neoplasm of thyroid gland: Secondary | ICD-10-CM

## 2018-12-22 LAB — CMP (CANCER CENTER ONLY)
ALT: 13 U/L (ref 0–44)
AST: 24 U/L (ref 15–41)
Albumin: 2.6 g/dL — ABNORMAL LOW (ref 3.5–5.0)
Alkaline Phosphatase: 85 U/L (ref 38–126)
Anion gap: 3 — ABNORMAL LOW (ref 5–15)
BUN: 9 mg/dL (ref 6–20)
CO2: 31 mmol/L (ref 22–32)
Calcium: 8.6 mg/dL — ABNORMAL LOW (ref 8.9–10.3)
Chloride: 105 mmol/L (ref 98–111)
Creatinine: 1.1 mg/dL (ref 0.61–1.24)
GFR, Est AFR Am: >60 mL/min (ref >60)
GFR, Estimated: >60 mL/min (ref >60)
Glucose, Bld: 99 mg/dL (ref 70–99)
Potassium: 4.3 mmol/L (ref 3.5–5.1)
Sodium: 139 mmol/L (ref 135–145)
Total Bilirubin: 0.5 mg/dL (ref 0.3–1.2)
Total Protein: 4.2 g/dL — ABNORMAL LOW (ref 6.5–8.1)

## 2018-12-22 LAB — CBC WITH DIFFERENTIAL (CANCER CENTER ONLY)
Abs Immature Granulocytes: 0.01 10*3/uL (ref 0.00–0.07)
Basophils Absolute: 0 10*3/uL (ref 0.0–0.1)
Basophils Relative: 1 %
Eosinophils Absolute: 0 10*3/uL (ref 0.0–0.5)
Eosinophils Relative: 1 %
HCT: 47.1 % (ref 39.0–52.0)
Hemoglobin: 15.4 g/dL (ref 13.0–17.0)
Immature Granulocytes: 0 %
Lymphocytes Relative: 24 %
Lymphs Abs: 1 10*3/uL (ref 0.7–4.0)
MCH: 29.7 pg (ref 26.0–34.0)
MCHC: 32.7 g/dL (ref 30.0–36.0)
MCV: 90.9 fL (ref 80.0–100.0)
Monocytes Absolute: 0.3 10*3/uL (ref 0.1–1.0)
Monocytes Relative: 7 %
Neutro Abs: 2.9 10*3/uL (ref 1.7–7.7)
Neutrophils Relative %: 67 %
Platelet Count: 215 10*3/uL (ref 150–400)
RBC: 5.18 MIL/uL (ref 4.22–5.81)
RDW: 15.3 % (ref 11.5–15.5)
WBC Count: 4.3 10*3/uL (ref 4.0–10.5)
nRBC: 0 % (ref 0.0–0.2)

## 2018-12-22 LAB — LACTATE DEHYDROGENASE: LDH: 205 U/L — ABNORMAL HIGH (ref 98–192)

## 2018-12-22 MED ORDER — POTASSIUM CHLORIDE CRYS ER 10 MEQ PO TBCR: 10.0000 meq | EXTENDED_RELEASE_TABLET | Freq: Every day | ORAL | 1 refills | Status: DC

## 2018-12-22 MED ORDER — METOLAZONE 5 MG PO TABS: 5.0000 mg | ORAL_TABLET | Freq: Every day | ORAL | 1 refills | Status: DC

## 2018-12-22 MED ORDER — FUROSEMIDE 80 MG PO TABS: 80.0000 mg | ORAL_TABLET | Freq: Every day | ORAL | 1 refills | Status: DC

## 2018-12-22 NOTE — Progress Notes (Signed)
Hematology and Oncology Follow Up Visit  Brian [name] [id number] 09-May-1975 43 y.o. 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 is here today with c/o lower extremity swelling. He has +2 pitting in both legs. No redness or discharge to suggest infection. Pedal pulses 2+.  His albumin today is low at 2.6, total protein 4.2.  He states that he has had some nausea, no vomiting. This has caused a decrease in his appetite. He is avoiding salt all together. He states that he is hydrating well. His weight is stable.  He is feeling fatigued.  He is still having issues with uncontrolled HTN. BP today is 146/114. He states that he is taking 0.4 mg of Clonidine BID. He states that Dr. Agustin Cree decreased his ISMO to 20 mg once daily but he has gone back to twice a day due to his persistent HTN and fluid retention.  He has had minimal SOB with exertion.  No fever, chills, cough, rash, dizziness, chest pain, palpitations, abdominal pain or change sin bowel or bladder habits.  He has tingling in his toes off and on.  No falls but he has had issues with balance due to the swelling in his feet and legs.  He follows up with Dr. Barrington Ellison at [facility] on 11/18. He will have repeat MRI of the brain and liver as well as a PET scan that same day.   ECOG Performance Status: 1 - Symptomatic but completely ambulatory  Medications:  Allergies as of 12/22/2018      Reactions   Dextromethorphan-guaifenesin Other (See Comments)   Irregular heartbeat   Doxycycline Anaphylaxis, Nausea And Vomiting, Rash, Other (See Comments)   "heart arrythmia" and "dyspepsia" (only oral doxycycline causes reaction)   Gadobutrol Hives, Other (See Comments)   Patient had MRI scan at [facility]. Patient called one hour after he left imaging facility to report two "blisters"  that came up on "back" of lip.    Gadolinium Derivatives Hives, Other (See Comments)   Patient had MRI scan at [facility]. Patient called one hour after he left imaging facility to report two "blisters" that came up on "back" of lip.    Guaifenesin Palpitations      Ibuprofen Hives, Itching   Lisinopril Other (See Comments)   Angioedema   Pantoprazole Itching   Tramadol Hives, Itching      Medication List       Accurate as of December 22, 2018 12:09 PM. If you have any questions, ask your nurse or doctor.        cloNIDine 0.2 MG tablet Commonly known as: CATAPRES Take 2 tablets (0.4 mg total) by mouth 2 (two) times daily.   EPINEPHrine 0.3 mg/0.3 mL Soaj injection Commonly known as: EpiPen 2-Pak USE AS DIRECTED FOR LIFE THREATENING ALLERGIC REACTIONS   isosorbide mononitrate 20 MG tablet Commonly known as: ISMO Take 1 tablet (20 mg total) by mouth 2 (two) times daily at 10 AM and 5 PM.   Lenvima (24 MG Daily Dose) 2 x 10 MG & 4 MG capsule Generic drug: lenvatinib 24 mg daily dose Take 24 mg by mouth at bedtime.   levothyroxine 200 MCG tablet Commonly known as: SYNTHROID Take 1 tablet (200 mcg total) by mouth daily before breakfast.   ondansetron 8 MG tablet Commonly known as: ZOFRAN Take  8 mg by mouth every 8 (eight) hours as needed for nausea or vomiting.   oxyCODONE 5 MG immediate release tablet Commonly known as: Oxy IR/ROXICODONE Take 1 tablet (5 mg total) by mouth every 6 (six) hours as needed for severe pain.   promethazine 12.5 MG tablet Commonly known as: PHENERGAN Take 1 tablet (12.5 mg total) by mouth at bedtime.       Allergies:  Allergies  Allergen Reactions  . Dextromethorphan-Guaifenesin Other (See Comments)    Irregular heartbeat   . Doxycycline Anaphylaxis, Nausea And Vomiting, Rash and Other (See Comments)    "heart arrythmia" and "dyspepsia" (only oral doxycycline causes reaction)  . Gadobutrol Hives and Other (See Comments)     Patient had MRI scan at [place]. Patient called one hour after he left imaging facility to report two "blisters" that came up on "back" of lip.    . Gadolinium Derivatives Hives and Other (See Comments)    Patient had MRI scan at [place]. Patient called one hour after he left imaging facility to report two "blisters" that came up on "back" of lip.   . Guaifenesin Palpitations       . Ibuprofen Hives and Itching  . Lisinopril Other (See Comments)    Angioedema  . Pantoprazole Itching  . Tramadol Hives and Itching    Past Medical History, Surgical history, Social history, and Family History were reviewed and updated.  Review of Systems: All other 10 point review of systems is negative.   Physical Exam:  vitals were not taken for this visit.   Wt Readings from Last 3 Encounters:  12/02/18 253 lb (114.8 kg)  11/29/18 247 lb (112 kg)  11/11/18 261 lb (118.4 kg)    Ocular: Sclerae unicteric, pupils equal, round and reactive to light Ear-nose-throat: Oropharynx clear, dentition fair Lymphatic: No cervical or supraclavicular adenopathy Lungs no rales or rhonchi, good excursion bilaterally Heart regular rate and rhythm, no murmur appreciated Abd soft, nontender, positive bowel sounds, no liver or spleen tip palpated on exam, no fluid wave  MSK no focal spinal tenderness, no joint edema Neuro: non-focal, well-oriented, appropriate affect Breasts: Deferred   Lab Results  Component Value Date   WBC 4.3 12/22/2018   HGB 15.4 12/22/2018   HCT 47.1 12/22/2018   MCV 90.9 12/22/2018   PLT 215 12/22/2018   No results found for: FERRITIN, IRON, TIBC, UIBC, IRONPCTSAT Lab Results  Component Value Date   RBC 5.18 12/22/2018   No results found for: KPAFRELGTCHN, LAMBDASER, KAPLAMBRATIO No results found for: IGGSERUM, IGA, IGMSERUM No results found for: Kathrynn Ducking, MSPIKE, SPEI   Chemistry      Component Value  Date/Time   NA 142 11/29/2018 1124   K 4.8 11/29/2018 1124   CL 105 11/29/2018 1124   CO2 34 (H) 11/29/2018 1124   BUN 19 11/29/2018 1124   CREATININE 1.30 (H) 11/29/2018 1124      Component Value Date/Time   CALCIUM 8.8 (L) 11/29/2018 1124   ALKPHOS 79 11/29/2018 1124   AST 21 11/29/2018 1124   ALT 12 11/29/2018 1124   BILITOT 0.7 11/29/2018 1124       Impression and Plan: 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   He is here today for symptoms management due to fluid retention with uncontrolled HTN.  I spoke with Dr. Marin Olp and  we will get him onto Zaroxolyn 5 mg PO daily 1 hour prior to Lasix 80 mg PO daily. He is concerned with developing hypokalemia. Potassium at this time is 4.3. We will have him take KDUR 10 meq PO daily.  He will contact Dr. Agustin Cree regarding the persistent HTN and ISMO frequency change.   We will plan to see him back on 11/30 for MD follow-up.  He is in agreement with the plan and will contact our office with any questions or concerns. We can certainly see him sooner if needed.   Laverna Peace, NP 11/5/202012:09 PM

## 2018-12-23 ENCOUNTER — Telehealth: Payer: Self-pay | Admitting: *Deleted

## 2018-12-23 LAB — TSH: TSH: 51.265 u[IU]/mL — ABNORMAL HIGH (ref 0.320–4.118)

## 2018-12-23 NOTE — Telephone Encounter (Signed)
Call placed to patient to find out who his Endocrinologist is per order of Dr. Marin Olp.  Pt states that his endocrinologist is Dr. Francetta Found.  Dr. Marin Olp notified and TSH results routed to Dr. Shirlyn Goltz office.

## 2018-12-27 ENCOUNTER — Encounter: Payer: Self-pay | Admitting: Hematology & Oncology

## 2019-01-04 DIAGNOSIS — G893 Neoplasm related pain (acute) (chronic): Secondary | ICD-10-CM | POA: Diagnosis not present

## 2019-01-04 DIAGNOSIS — C787 Secondary malignant neoplasm of liver and intrahepatic bile duct: Secondary | ICD-10-CM | POA: Diagnosis not present

## 2019-01-04 DIAGNOSIS — Z5111 Encounter for antineoplastic chemotherapy: Secondary | ICD-10-CM | POA: Diagnosis not present

## 2019-01-04 DIAGNOSIS — K769 Liver disease, unspecified: Secondary | ICD-10-CM | POA: Diagnosis not present

## 2019-01-04 DIAGNOSIS — C73 Malignant neoplasm of thyroid gland: Secondary | ICD-10-CM | POA: Diagnosis not present

## 2019-01-04 DIAGNOSIS — C799 Secondary malignant neoplasm of unspecified site: Secondary | ICD-10-CM | POA: Diagnosis not present

## 2019-01-04 DIAGNOSIS — Z91041 Radiographic dye allergy status: Secondary | ICD-10-CM | POA: Diagnosis not present

## 2019-01-04 DIAGNOSIS — K7689 Other specified diseases of liver: Secondary | ICD-10-CM | POA: Diagnosis not present

## 2019-01-05 DIAGNOSIS — M25552 Pain in left hip: Secondary | ICD-10-CM | POA: Diagnosis not present

## 2019-01-06 ENCOUNTER — Other Ambulatory Visit: Payer: Self-pay | Admitting: *Deleted

## 2019-01-06 ENCOUNTER — Telehealth: Payer: Self-pay | Admitting: *Deleted

## 2019-01-06 ENCOUNTER — Telehealth: Payer: Self-pay | Admitting: Cardiology

## 2019-01-06 DIAGNOSIS — C73 Malignant neoplasm of thyroid gland: Secondary | ICD-10-CM

## 2019-01-06 NOTE — Telephone Encounter (Signed)
Call received from patient stating that Dr. Barrington Ellison would like for pt to get a u/a every two weeks d/t elevated protein in his urine.  Dr. Marin Olp notified and u/a order placed for 01/16/19.  Dr. Marin Olp notified.

## 2019-01-14 DIAGNOSIS — M25552 Pain in left hip: Secondary | ICD-10-CM | POA: Diagnosis not present

## 2019-01-16 ENCOUNTER — Other Ambulatory Visit: Payer: Self-pay

## 2019-01-16 ENCOUNTER — Encounter: Payer: Self-pay | Admitting: Cardiology

## 2019-01-16 ENCOUNTER — Inpatient Hospital Stay: Payer: 59

## 2019-01-16 ENCOUNTER — Encounter: Payer: Self-pay | Admitting: Hematology & Oncology

## 2019-01-16 ENCOUNTER — Ambulatory Visit (INDEPENDENT_AMBULATORY_CARE_PROVIDER_SITE_OTHER): Payer: 59 | Admitting: Cardiology

## 2019-01-16 ENCOUNTER — Inpatient Hospital Stay (HOSPITAL_BASED_OUTPATIENT_CLINIC_OR_DEPARTMENT_OTHER): Payer: 59 | Admitting: Hematology & Oncology

## 2019-01-16 VITALS — BP 198/100 | HR 113 | Ht 70.0 in | Wt 240.0 lb

## 2019-01-16 VITALS — BP 166/111 | HR 96 | Temp 97.5°F | Resp 18 | Wt 240.0 lb

## 2019-01-16 DIAGNOSIS — R0609 Other forms of dyspnea: Secondary | ICD-10-CM

## 2019-01-16 DIAGNOSIS — I10 Essential (primary) hypertension: Secondary | ICD-10-CM

## 2019-01-16 DIAGNOSIS — Z79899 Other long term (current) drug therapy: Secondary | ICD-10-CM | POA: Diagnosis not present

## 2019-01-16 DIAGNOSIS — R600 Localized edema: Secondary | ICD-10-CM

## 2019-01-16 DIAGNOSIS — M7989 Other specified soft tissue disorders: Secondary | ICD-10-CM | POA: Diagnosis not present

## 2019-01-16 DIAGNOSIS — R5383 Other fatigue: Secondary | ICD-10-CM | POA: Diagnosis not present

## 2019-01-16 DIAGNOSIS — R809 Proteinuria, unspecified: Secondary | ICD-10-CM | POA: Diagnosis not present

## 2019-01-16 DIAGNOSIS — C73 Malignant neoplasm of thyroid gland: Secondary | ICD-10-CM

## 2019-01-16 DIAGNOSIS — I4949 Other premature depolarization: Secondary | ICD-10-CM

## 2019-01-16 DIAGNOSIS — C787 Secondary malignant neoplasm of liver and intrahepatic bile duct: Secondary | ICD-10-CM | POA: Diagnosis not present

## 2019-01-16 DIAGNOSIS — R0600 Dyspnea, unspecified: Secondary | ICD-10-CM | POA: Diagnosis not present

## 2019-01-16 DIAGNOSIS — R110 Nausea: Secondary | ICD-10-CM | POA: Diagnosis not present

## 2019-01-16 LAB — CMP (CANCER CENTER ONLY)
ALT: 14 U/L (ref 0–44)
AST: 18 U/L (ref 15–41)
Albumin: 3.4 g/dL — ABNORMAL LOW (ref 3.5–5.0)
Alkaline Phosphatase: 77 U/L (ref 38–126)
Anion gap: 4 — ABNORMAL LOW (ref 5–15)
BUN: 28 mg/dL — ABNORMAL HIGH (ref 6–20)
CO2: 31 mmol/L (ref 22–32)
Calcium: 9 mg/dL (ref 8.9–10.3)
Chloride: 103 mmol/L (ref 98–111)
Creatinine: 0.9 mg/dL (ref 0.61–1.24)
GFR, Est AFR Am: >60 mL/min (ref >60)
GFR, Estimated: >60 mL/min (ref >60)
Glucose, Bld: 104 mg/dL — ABNORMAL HIGH (ref 70–99)
Potassium: 4.4 mmol/L (ref 3.5–5.1)
Sodium: 138 mmol/L (ref 135–145)
Total Bilirubin: 0.5 mg/dL (ref 0.3–1.2)
Total Protein: 5.4 g/dL — ABNORMAL LOW (ref 6.5–8.1)

## 2019-01-16 LAB — URINALYSIS, COMPLETE (UACMP) WITH MICROSCOPIC
Bilirubin Urine: NEGATIVE
Glucose, UA: NEGATIVE mg/dL
Ketones, ur: NEGATIVE mg/dL
Leukocytes,Ua: NEGATIVE
Nitrite: NEGATIVE
Protein, ur: >300 mg/dL — AB
Specific Gravity, Urine: 1.025 (ref 1.005–1.030)
pH: 6 (ref 5.0–8.0)

## 2019-01-16 LAB — CBC WITH DIFFERENTIAL (CANCER CENTER ONLY)
Abs Immature Granulocytes: 0.03 10*3/uL (ref 0.00–0.07)
Basophils Absolute: 0 10*3/uL (ref 0.0–0.1)
Basophils Relative: 0 %
Eosinophils Absolute: 0.1 10*3/uL (ref 0.0–0.5)
Eosinophils Relative: 1 %
HCT: 46 % (ref 39.0–52.0)
Hemoglobin: 15.2 g/dL (ref 13.0–17.0)
Immature Granulocytes: 0 %
Lymphocytes Relative: 20 %
Lymphs Abs: 1.4 10*3/uL (ref 0.7–4.0)
MCH: 30.3 pg (ref 26.0–34.0)
MCHC: 33 g/dL (ref 30.0–36.0)
MCV: 91.6 fL (ref 80.0–100.0)
Monocytes Absolute: 0.5 10*3/uL (ref 0.1–1.0)
Monocytes Relative: 7 %
Neutro Abs: 5 10*3/uL (ref 1.7–7.7)
Neutrophils Relative %: 72 %
Platelet Count: 201 10*3/uL (ref 150–400)
RBC: 5.02 MIL/uL (ref 4.22–5.81)
RDW: 14.5 % (ref 11.5–15.5)
WBC Count: 7.1 10*3/uL (ref 4.0–10.5)
nRBC: 0 % (ref 0.0–0.2)

## 2019-01-16 LAB — LACTATE DEHYDROGENASE: LDH: 194 U/L — ABNORMAL HIGH (ref 98–192)

## 2019-01-16 MED ORDER — LOSARTAN POTASSIUM 25 MG PO TABS: 25.0000 mg | ORAL_TABLET | Freq: Two times a day (BID) | ORAL | 1 refills | Status: DC

## 2019-01-16 NOTE — Progress Notes (Signed)
Cardiology Office Note:    Date:  01/16/2019   ID:  Brian [name], DOB [birth year], MRN [id number]  PCP:  Sharyne Richters, MD  Cardiologist:  Jenne Campus, MD    Referring MD: Sharyne Richters, MD   Chief Complaint  Patient presents with  . Blood pressure issues    History of Present Illness:    Brian [name] is a 43 y.o. male complex past medical history initially presented to me because of PVCs then later he was identified to have metastases of his what appears to be thyroid cancer.  That being managed excellently by oncology team.  However as a complication and side effect of the medication he is doing well he developed essential hypertension as well as proteinuria.  Medication Lenvima, has been temporarily stopped and we are waiting for protein level to go down his blood pressure still elevated he usually takes 0.4 mg of Klonopin every day that is 0.2 mg in the morning 0.2 mg at evening time sometimes to the midday to get high blood pressure he will take extra dose of 0.2.  The goal of today's visit is trying to switch him on medication different than clonidine.  I will propose to start ARB in form of losartan 25 mg twice daily.  I will check his kidney function next week.  He will also be scheduled to have an echocardiogram.  Past Medical History:  Diagnosis Date  . Essential hypertension 09/09/2018  . Kidney stone   . Primary cancer of thyroid with metastasis to other site ([facility]) 05/04/2018  . Rickettsia infection     Past Surgical History:  Procedure Laterality Date  . BIOPSY  04/05/2018   Procedure: BIOPSY;  Surgeon: Ronald Lobo, MD;  Location: [facility];  Service: Endoscopy;;  . CHOLECYSTECTOMY    . ESOPHAGOGASTRODUODENOSCOPY (EGD) WITH PROPOFOL N/A 04/05/2018   Procedure: ESOPHAGOGASTRODUODENOSCOPY (EGD) WITH PROPOFOL;  Surgeon: Ronald Lobo, MD;  Location: [facility];  Service: Endoscopy;  Laterality: N/A;  . IR RADIOLOGIST EVAL & MGMT  10/11/2018    Current  Medications: Current Meds  Medication Sig  . cloNIDine (CATAPRES) 0.2 MG tablet Take 2 tablets (0.4 mg total) by mouth 2 (two) times daily.  . cyclobenzaprine (FLEXERIL) 10 MG tablet Take 10 mg by mouth 3 (three) times daily as needed for muscle spasms.  Marland Kitchen EPINEPHrine (EPIPEN 2-PAK) 0.3 mg/0.3 mL IJ SOAJ injection USE AS DIRECTED FOR LIFE THREATENING ALLERGIC REACTIONS  . furosemide (LASIX) 80 MG tablet Take 1 tablet (80 mg total) by mouth daily.  . isosorbide mononitrate (ISMO) 20 MG tablet Take 1 tablet (20 mg total) by mouth 2 (two) times daily at 10 AM and 5 PM.  . LENVIMA, 24 MG DAILY DOSE, 2 x 10 MG & 4 MG capsule Take 24 mg by mouth at bedtime.   Marland Kitchen levothyroxine (SYNTHROID) 200 MCG tablet Take 1 tablet (200 mcg total) by mouth daily before breakfast.  . levothyroxine (SYNTHROID) 75 MCG tablet Take 75 mcg by mouth daily.  . metolazone (ZAROXOLYN) 5 MG tablet Take 1 tablet (5 mg total) by mouth daily. Take 1 hour before Lasix.  Marland Kitchen ondansetron (ZOFRAN) 8 MG tablet Take 8 mg by mouth every 8 (eight) hours as needed for nausea or vomiting.   Marland Kitchen oxyCODONE (OXY IR/ROXICODONE) 5 MG immediate release tablet Take 1 tablet (5 mg total) by mouth every 6 (six) hours as needed for severe pain.  . potassium chloride SA (KLOR-CON) 10 MEQ tablet Take 1 tablet (10 mEq total)  by mouth daily.  . promethazine (PHENERGAN) 12.5 MG tablet Take 1 tablet (12.5 mg total) by mouth at bedtime.     Allergies:   Dextromethorphan-guaifenesin, Doxycycline, Gadobutrol, Gadolinium derivatives, Guaifenesin, Ibuprofen, Lisinopril, Pantoprazole, and Tramadol   Social History   Socioeconomic History  . Marital status: Married    Spouse name: Not on file  . Number of children: Not on file  . Years of education: Not on file  . Highest education level: Not on file  Occupational History  . Not on file  Social Needs  . Financial resource strain: Not on file  . Food insecurity    Worry: Not on file    Inability: Not on  file  . Transportation needs    Medical: Not on file    Non-medical: Not on file  Tobacco Use  . Smoking status: Never Smoker  . Smokeless tobacco: Never Used  Substance and Sexual Activity  . Alcohol use: No  . Drug use: No  . Sexual activity: Not on file  Lifestyle  . Physical activity    Days per week: Not on file    Minutes per session: Not on file  . Stress: Not on file  Relationships  . Social Herbalist on phone: Not on file    Gets together: Not on file    Attends religious service: Not on file    Active member of club or organization: Not on file    Attends meetings of clubs or organizations: Not on file    Relationship status: Not on file  Other Topics Concern  . Not on file  Social History Narrative  . Not on file     Family History: The patient's family history includes Allergic rhinitis in his father; Diabetes in his mother; Heart attack in his mother; Heart disease in his mother; Heart failure in his mother; Hyperlipidemia in his father and mother; Hypertension in his mother; Rashes / Skin problems in his father. There is no history of Sudden death or Thyroid disease. ROS:   Please see the history of present illness.    All 14 point review of systems negative except as described per history of present illness  EKGs/Labs/Other Studies Reviewed:      Recent Labs: 09/12/2018: Magnesium 2.1 11/02/2018: B Natriuretic Peptide 117.9 12/22/2018: TSH 51.265 01/16/2019: ALT 14; BUN 28; Creatinine 0.90; Hemoglobin 15.2; Platelet Count 201; Potassium 4.4; Sodium 138  Recent Lipid Panel    Component Value Date/Time   CHOL  10/08/2008 0655    119        ATP III CLASSIFICATION:  <200     mg/dL   Desirable  200-239  mg/dL   Borderline High  >=240    mg/dL   High          TRIG 84 10/08/2008 0655   HDL 20 (L) 10/08/2008 0655   CHOLHDL 6.0 10/08/2008 0655   VLDL 17 10/08/2008 0655   LDLCALC  10/08/2008 0655    82        Total Cholesterol/HDL:CHD Risk  Coronary Heart Disease Risk Table                     Men   Women  1/2 Average Risk   3.4   3.3  Average Risk       5.0   4.4  2 X Average Risk   9.6   7.1  3 X Average Risk  23.4   11.0  Use the calculated Patient Ratio above and the CHD Risk Table to determine the patient's CHD Risk.        ATP III CLASSIFICATION (LDL):  <100     mg/dL   Optimal  100-129  mg/dL   Near or Above                    Optimal  130-159  mg/dL   Borderline  160-189  mg/dL   High  >190     mg/dL   Very High    Physical Exam:    VS:  BP (!) 198/100   Pulse (!) 113   Ht 5\' 10"  (1.778 m)   Wt 240 lb (108.9 kg)   SpO2 96%   BMI 34.44 kg/m     Wt Readings from Last 3 Encounters:  01/16/19 240 lb (108.9 kg)  01/16/19 240 lb (108.9 kg)  12/22/18 253 lb (114.8 kg)     GEN:  Well nourished, well developed in no acute distress HEENT: Normal NECK: No JVD; No carotid bruits LYMPHATICS: No lymphadenopathy CARDIAC: RRR, no murmurs, no rubs, no gallops RESPIRATORY:  Clear to auscultation without rales, wheezing or rhonchi  ABDOMEN: Soft, non-tender, non-distended MUSCULOSKELETAL:  No edema; No deformity  SKIN: Warm and dry LOWER EXTREMITIES: no swelling NEUROLOGIC:  Alert and oriented x 3 PSYCHIATRIC:  Normal affect   ASSESSMENT:    1. Extrasystole   2. Essential hypertension   3. Dyspnea on exertion   4. Primary cancer of thyroid with metastasis to other site [facility])    PLAN:    In order of problems listed above:  1. Extrasystole noted problem 2. Essential hypertension plan as outlined above.  We will schedule him to have echocardiogram will also add losartan 25 twice daily gradually we will try to increase the dose watching kidney function carefully and gradually withdrawing clonidine. 3. Dyspnea exertion doing for 4. Primary cancer of thyroid with metastasis to other side.  That being follow-up excellently by oncology team.  I spoke to Dr. Marin Olp today regarding his care.    Medication Adjustments/Labs and Tests Ordered: Current medicines are reviewed at length with the patient today.  Concerns regarding medicines are outlined above.  No orders of the defined types were placed in this encounter.  Medication changes: No orders of the defined types were placed in this encounter.   Signed, Park Liter, MD, [facility] 01/16/2019 4:15 PM    Brian [name]

## 2019-01-16 NOTE — Progress Notes (Signed)
Hematology and Oncology Follow Up Visit  Brian [name] [id number] 1975-03-06 43 y.o. 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  The good news is that the Lenvima clearly has helped his thyroid cancer.  He underwent his scans at [facility].  The scans at [facility] show that the cancer was responding to the [facility].  He had a decrease in activity and decrease in size of his metastasis.  The problem is that the blood pressure issues have been horrible.  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  All of this is from the [place].  I spoke with his oncologist at [facility]-Dr. Barrington Ellison.  She felt that the [facility] needed to be stopped for right now.  The nephrotic range protein needed to resolve.  We are going to do a 24-hour urine on him today.  He will see his cardiologist today.  Hopefully, his cardiologist will be able to get his blood pressure under better control.  I just feel bad that he has had these side effects from the [facility].  I am just pleased that it has worked well.  However, the toxicity has been troublesome.  He is eating well.  He is having no problems with nausea or vomiting.  He has had no problems with diarrhea.  There is been leg swelling but this is a little bit better.  He will see his cardiologist also for a echocardiogram.  Currently, I would say his performance status is ECOG 1.      Medications:  Current Outpatient Medications:  .  levothyroxine (SYNTHROID) 75 MCG tablet, Take 75 mcg by mouth daily., Disp: , Rfl:  .  cloNIDine (CATAPRES) 0.2 MG tablet, Take 2 tablets (0.4 mg total) by mouth 2 (two) times daily., Disp: 120 tablet, Rfl: 5 .  cyclobenzaprine (FLEXERIL) 10 MG tablet, Take 10 mg by mouth 3 (three) times daily as needed for muscle spasms., Disp: ,  Rfl:  .  EPINEPHrine (EPIPEN 2-PAK) 0.3 mg/0.3 mL IJ SOAJ injection, USE AS DIRECTED FOR LIFE THREATENING ALLERGIC REACTIONS, Disp: 4 Device, Rfl: 3 .  furosemide (LASIX) 80 MG tablet, Take 1 tablet (80 mg total) by mouth daily., Disp: 30 tablet, Rfl: 1 .  isosorbide mononitrate (ISMO) 20 MG tablet, Take 1 tablet (20 mg total) by mouth 2 (two) times daily at 10 AM and 5 PM., Disp: 60 tablet, Rfl: 1 .  LENVIMA, 24 MG DAILY DOSE, 2 x 10 MG & 4 MG capsule, Take 24 mg by mouth at bedtime. , Disp: , Rfl:  .  levothyroxine (SYNTHROID) 200 MCG tablet, Take 1 tablet (200 mcg total) by mouth daily before breakfast., Disp: 90 tablet, Rfl: 3 .  metolazone (ZAROXOLYN) 5 MG tablet, Take 1 tablet (5 mg total) by mouth daily. Take 1 hour before Lasix., Disp: 30 tablet, Rfl: 1 .  ondansetron (ZOFRAN) 8 MG tablet, Take 8 mg by mouth every 8 (eight) hours as needed for nausea or vomiting. , Disp: , Rfl:  .  oxyCODONE (OXY IR/ROXICODONE) 5 MG immediate release tablet, Take 1 tablet (5 mg total) by mouth every 6 (six) hours as needed for severe pain., Disp: 120 tablet, Rfl: 0 .  potassium chloride SA (KLOR-CON) 10 MEQ tablet, Take 1 tablet (10 mEq  total) by mouth daily., Disp: 30 tablet, Rfl: 1 .  promethazine (PHENERGAN) 12.5 MG tablet, Take 1 tablet (12.5 mg total) by mouth at bedtime., Disp: 60 tablet, Rfl: 3  Allergies:  Allergies  Allergen Reactions  . Dextromethorphan-Guaifenesin Other (See Comments)    Irregular heartbeat   . Doxycycline Anaphylaxis, Nausea And Vomiting, Rash and Other (See Comments)    "heart arrythmia" and "dyspepsia" (only oral doxycycline causes reaction)  . Gadobutrol Hives and Other (See Comments)    Patient had MRI scan at [place]. Patient called one hour after he left imaging facility to report two "blisters" that came up on "back" of lip.    . Gadolinium Derivatives Hives and Other (See Comments)    Patient had MRI scan at [place]. Patient called one hour  after he left imaging facility to report two "blisters" that came up on "back" of lip.   . Guaifenesin Palpitations       . Ibuprofen Hives and Itching  . Lisinopril Other (See Comments)    Angioedema  . Pantoprazole Itching  . Tramadol Hives and Itching    Past Medical History, Surgical history, Social history, and Family History were reviewed and updated.  Review of Systems: Review of Systems  Constitutional: Negative.   HENT:   Positive for lump/mass and sore throat.   Eyes: Negative.   Respiratory: Negative.   Cardiovascular: Negative.   Gastrointestinal: Negative.   Endocrine: Negative.   Genitourinary: Negative.    Musculoskeletal: Negative.   Skin: Negative.   Neurological: Negative.   Hematological: Negative.   Psychiatric/Behavioral: Negative.     Physical Exam:  weight is 240 lb (108.9 kg). His temporal temperature is 97.5 F (36.4 C) (abnormal). His blood pressure is 166/111 (abnormal) and his pulse is 96. His respiration is 18 and oxygen saturation is 99%.   Wt Readings from Last 3 Encounters:  01/16/19 240 lb (108.9 kg)  12/22/18 253 lb (114.8 kg)  12/02/18 253 lb (114.8 kg)    Physical Exam Vitals signs reviewed.  HENT:     Head: Normocephalic and atraumatic.  Eyes:     Pupils: Pupils are equal, round, and reactive to light.  Neck:     Musculoskeletal: Normal range of motion.     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  This is well-healing.  There is some slight fullness but no distinct mass noted in the neck. Cardiovascular:     Rate and Rhythm: Normal rate and regular rhythm.     Heart sounds: Normal heart sounds.  Pulmonary:     Effort: Pulmonary effort is normal.     Breath sounds: Normal breath sounds.  Abdominal:     General: Bowel sounds are normal.     Palpations: Abdomen is soft.  Musculoskeletal: Normal range of motion.        General: Swelling present. No tenderness or deformity.   Lymphadenopathy:     Cervical: No cervical adenopathy.  Skin:    General: Skin is warm and dry.     Findings: No erythema or rash.  Neurological:     Mental Status: He is alert and oriented to person, place, and time.  Psychiatric:        Behavior: Behavior normal.        Thought Content: Thought content normal.        Judgment: Judgment normal.      Lab Results  Component Value Date  WBC 7.1 01/16/2019   HGB 15.2 01/16/2019   HCT 46.0 01/16/2019   MCV 91.6 01/16/2019   PLT 201 01/16/2019     Chemistry      Component Value Date/Time   NA 138 01/16/2019 1308   K 4.4 01/16/2019 1308   CL 103 01/16/2019 1308   CO2 31 01/16/2019 1308   BUN 28 (H) 01/16/2019 1308   CREATININE 0.90 01/16/2019 1308      Component Value Date/Time   CALCIUM 9.0 01/16/2019 1308   ALKPHOS 77 01/16/2019 1308   AST 18 01/16/2019 1308   ALT 14 01/16/2019 1308   BILITOT 0.5 01/16/2019 1308      Impression and Plan: Brian [name] is a 43 year old white male.  He has a incredibly unusual metastatic Hurthle cell tumor of the thyroid.  He is being followed by the endocrine malignancy clinic at [facility].    For right now, we will have to follow him closely.  His urinalysis did show greater than 300 mg of protein.  We will see what the 24-hour urine shows.  I do not think will be a problem with him being off Lenvima for a couple weeks or so.  I am more worried that he not develop some permanent toxicity that it could adversely affect his quality of life.  I know that we clearly have other options for treating the thyroid cancer.  It would be nice, however, to keep him on the [place], even at a lower dose.  I will have him come back to see me in another couple weeks.  This is definitely very complicated.  I spent about 40 minutes with him today.  There are a lot of issues that are going on with the side effects of the Lenvima that we really have to try to prevent.   [name], MD 11/30/20204:03  PM

## 2019-01-16 NOTE — Patient Instructions (Signed)
Medication Instructions:  Your physician has recommended you make the following change in your medication:   START: Losartan 25 mg twice daily   *If you need a refill on your cardiac medications before your next appointment, please call your pharmacy*  Lab Work: Your physician recommends that you return for lab work in 1 week when you come for echocardiogram: bmp   If you have labs (blood work) drawn today and your tests are completely normal, you will receive your results only by:  [place] (if you have MyChart) OR  A paper copy in the mail If you have any lab test that is abnormal or we need to change your treatment, we will call you to review the results.  Testing/Procedures: Your physician has requested that you have an echocardiogram. Echocardiography is a painless test that uses sound waves to create images of your heart. It provides your doctor with information about the size and shape of your heart and how well your hearts chambers and valves are working. This procedure takes approximately one hour. There are no restrictions for this procedure.    Follow-Up: At [facility], you and your health needs are our priority.  As part of our continuing mission to provide you with exceptional heart care, we have created designated Provider Care Teams.  These Care Teams include your primary Cardiologist (physician) and Advanced Practice Providers (APPs -  Physician Assistants and Nurse Practitioners) who all work together to provide you with the care you need, when you need it.  Your next appointment:   1 month(s)  The format for your next appointment:   In Person  Provider:   You may see Jenne Campus, MD or the following Advanced Practice Provider on your designated Care Team:    Laurann Montana, FNP   Other Instructions  Losartan tablets What is this medicine? LOSARTAN (loe SAR tan) is used to treat high blood pressure and to reduce the risk of stroke in certain  patients. This drug also slows the progression of kidney disease in patients with diabetes. This medicine may be used for other purposes; ask your health care provider or pharmacist if you have questions. COMMON BRAND NAME(S): Cozaar What should I tell my health care provider before I take this medicine? They need to know if you have any of these conditions:  heart failure  kidney or liver disease  an unusual or allergic reaction to losartan, other medicines, foods, dyes, or preservatives  pregnant or trying to get pregnant  breast-feeding How should I use this medicine? Take this medicine by mouth with a glass of water. Follow the directions on the prescription label. This medicine can be taken with or without food. Take your doses at regular intervals. Do not take your medicine more often than directed. Talk to your pediatrician regarding the use of this medicine in children. Special care may be needed. Overdosage: If you think you have taken too much of this medicine contact a poison control center or emergency room at once. NOTE: This medicine is only for you. Do not share this medicine with others. What if I miss a dose? If you miss a dose, take it as soon as you can. If it is almost time for your next dose, take only that dose. Do not take double or extra doses. What may interact with this medicine?  blood pressure medicines  diuretics, especially triamterene, spironolactone, or amiloride  fluconazole  NSAIDs, medicines for pain and inflammation, like ibuprofen or naproxen  potassium salts or potassium supplements  rifampin This list may not describe all possible interactions. Give your health care provider a list of all the medicines, herbs, non-prescription drugs, or dietary supplements you use. Also tell them if you smoke, drink alcohol, or use illegal drugs. Some items may interact with your medicine. What should I watch for while using this medicine? Visit your doctor  or health care professional for regular checks on your progress. Check your blood pressure as directed. Ask your doctor or health care professional what your blood pressure should be and when you should contact him or her. Call your doctor or health care professional if you notice an irregular or fast heart beat. Women should inform their doctor if they wish to become pregnant or think they might be pregnant. There is a potential for serious side effects to an unborn child, particularly in the second or third trimester. Talk to your health care professional or pharmacist for more information. You may get drowsy or dizzy. Do not drive, use machinery, or do anything that needs mental alertness until you know how this drug affects you. Do not stand or sit up quickly, especially if you are an older patient. This reduces the risk of dizzy or fainting spells. Alcohol can make you more drowsy and dizzy. Avoid alcoholic drinks. Avoid salt substitutes unless you are told otherwise by your doctor or health care professional. Do not treat yourself for coughs, colds, or pain while you are taking this medicine without asking your doctor or health care professional for advice. Some ingredients may increase your blood pressure. What side effects may I notice from receiving this medicine? Side effects that you should report to your doctor or health care professional as soon as possible:  confusion, dizziness, light headedness or fainting spells  decreased amount of urine passed  difficulty breathing or swallowing, hoarseness, or tightening of the throat  fast or irregular heart beat, palpitations, or chest pain  skin rash, itching  swelling of your face, lips, tongue, hands, or feet Side effects that usually do not require medical attention (report to your doctor or health care professional if they continue or are bothersome):  cough  decreased sexual function or desire  headache  nasal congestion or  stuffiness  nausea or stomach pain  sore or cramping muscles This list may not describe all possible side effects. Call your doctor for medical advice about side effects. You may report side effects to FDA at 1-800-FDA-1088. Where should I keep my medicine? Keep out of the reach of children. Store at room temperature between 15 and 30 degrees C (59 and 86 degrees F). Protect from light. Keep container tightly closed. Throw away any unused medicine after the expiration date. NOTE: This sheet is a summary. It may not cover all possible information. If you have questions about this medicine, talk to your doctor, pharmacist, or health care provider.  2020 Elsevier/Gold Standard (2007-04-15 16:42:18)   Echocardiogram An echocardiogram is a procedure that uses painless sound waves (ultrasound) to produce an image of the heart. Images from an echocardiogram can provide important information about:  Signs of coronary artery disease (CAD).  Aneurysm detection. An aneurysm is a weak or damaged part of an artery wall that bulges out from the normal force of blood pumping through the body.  Heart size and shape. Changes in the size or shape of the heart can be associated with certain conditions, including heart failure, aneurysm, and CAD.  Heart muscle function.  Heart valve function.  Signs of a past heart attack.  Fluid buildup around the heart.  Thickening of the heart muscle.  A tumor or infectious growth around the heart valves. Tell a health care provider about:  Any allergies you have.  All medicines you are taking, including vitamins, herbs, eye drops, creams, and over-the-counter medicines.  Any blood disorders you have.  Any surgeries you have had.  Any medical conditions you have.  Whether you are pregnant or may be pregnant. What are the risks? Generally, this is a safe procedure. However, problems may occur, including:  Allergic reaction to dye (contrast) that may  be used during the procedure. What happens before the procedure? No specific preparation is needed. You may eat and drink normally. What happens during the procedure?   An IV tube may be inserted into one of your veins.  You may receive contrast through this tube. A contrast is an injection that improves the quality of the pictures from your heart.  A gel will be applied to your chest.  A wand-like tool (transducer) will be moved over your chest. The gel will help to transmit the sound waves from the transducer.  The sound waves will harmlessly bounce off of your heart to allow the heart images to be captured in real-time motion. The images will be recorded on a computer. The procedure may vary among health care providers and hospitals. What happens after the procedure?  You may return to your normal, everyday life, including diet, activities, and medicines, unless your health care provider tells you not to do that. Summary  An echocardiogram is a procedure that uses painless sound waves (ultrasound) to produce an image of the heart.  Images from an echocardiogram can provide important information about the size and shape of your heart, heart muscle function, heart valve function, and fluid buildup around your heart.  You do not need to do anything to prepare before this procedure. You may eat and drink normally.  After the echocardiogram is completed, you may return to your normal, everyday life, unless your health care provider tells you not to do that. This information is not intended to replace advice given to you by your health care provider. Make sure you discuss any questions you have with your health care provider. Document Released: 01/31/2000 Document Revised: 05/26/2018 Document Reviewed: 03/07/2016 Elsevier Patient Education  2020 [employer].

## 2019-01-17 DIAGNOSIS — R509 Fever, unspecified: Secondary | ICD-10-CM | POA: Diagnosis not present

## 2019-01-18 LAB — PROTEIN, URINE, 24 HOUR
Collection Interval-UPROT: 24 hours
Protein, 24H Urine: 8140 mg/d — ABNORMAL HIGH (ref 50–100)
Protein, Urine: 407 mg/dL
Urine Total Volume-UPROT: 2000 mL

## 2019-01-19 ENCOUNTER — Encounter: Payer: Self-pay | Admitting: *Deleted

## 2019-01-24 ENCOUNTER — Ambulatory Visit (HOSPITAL_BASED_OUTPATIENT_CLINIC_OR_DEPARTMENT_OTHER): Payer: 59

## 2019-01-27 ENCOUNTER — Ambulatory Visit (HOSPITAL_BASED_OUTPATIENT_CLINIC_OR_DEPARTMENT_OTHER): Payer: 59

## 2019-01-29 ENCOUNTER — Encounter: Payer: Self-pay | Admitting: Hematology & Oncology

## 2019-01-30 ENCOUNTER — Telehealth: Payer: Self-pay | Admitting: Unknown Physician Specialty

## 2019-01-30 ENCOUNTER — Other Ambulatory Visit: Payer: Self-pay | Admitting: Unknown Physician Specialty

## 2019-01-30 ENCOUNTER — Telehealth: Payer: Self-pay | Admitting: *Deleted

## 2019-01-30 ENCOUNTER — Other Ambulatory Visit: Payer: Self-pay | Admitting: *Deleted

## 2019-01-30 DIAGNOSIS — I10 Essential (primary) hypertension: Secondary | ICD-10-CM

## 2019-01-30 DIAGNOSIS — U071 COVID-19: Secondary | ICD-10-CM

## 2019-01-30 DIAGNOSIS — C73 Malignant neoplasm of thyroid gland: Secondary | ICD-10-CM

## 2019-01-30 DIAGNOSIS — R519 Headache, unspecified: Secondary | ICD-10-CM

## 2019-01-30 MED ORDER — OXYCODONE HCL 5 MG PO TABS: 5.0000 mg | ORAL_TABLET | Freq: Four times a day (QID) | ORAL | 0 refills | Status: DC | PRN

## 2019-01-30 MED ORDER — PROMETHAZINE HCL 12.5 MG PO TABS: 12.5000 mg | ORAL_TABLET | Freq: Every day | ORAL | 3 refills | Status: DC

## 2019-01-30 NOTE — Telephone Encounter (Signed)
Brian Shire, RN called from Dr. Antonieta Pert office with concern regarding patient's blood pressure and heart rate. His BP today is 140/110 and HR is 110-120. Patient is COVID positive. Please speak with Dr. Agustin Cree and have him advise of further recommendations. Thanks!

## 2019-01-30 NOTE — Telephone Encounter (Signed)
  I connected by phone with Brian [name] on 01/30/2019 at 3:54 PM to discuss the potential use of an new treatment for mild to moderate COVID-19 viral infection in non-hospitalized patients.  This patient is a 43 y.o. male that meets the FDA criteria for Emergency Use Authorization of bamlanivimab or casirivimab\imdevimab.  Has a (+) direct SARS-CoV-2 viral test result  Has mild or moderate COVID-19   Is ? 43 years of age and weighs ? 40 kg  Is NOT hospitalized due to COVID-19  Is NOT requiring oxygen therapy or requiring an increase in baseline oxygen flow rate due to COVID-19  Is within 10 days of symptom onset  Has at least one of the high risk factor(s) for progression to severe COVID-19 and/or hospitalization as defined in EUA.  Specific high risk criteria : BMI >/= 35 and immunosuppresssed   I have spoken and communicated the following to the patient or parent/caregiver:  1. FDA has authorized the emergency use of bamlanivimab and casirivimab\imdevimab for the treatment of mild to moderate COVID-19 in adults and pediatric patients with positive results of direct SARS-CoV-2 viral testing who are 8 years of age and older weighing at least 40 kg, and who are at high risk for progressing to severe COVID-19 and/or hospitalization.  2. The significant known and potential risks and benefits of bamlanivimab and casirivimab\imdevimab, and the extent to which such potential risks and benefits are unknown.  3. Information on available alternative treatments and the risks and benefits of those alternatives, including clinical trials.  4. Patients treated with bamlanivimab and casirivimab\imdevimab should continue to self-isolate and use infection control measures (e.g., wear mask, isolate, social distance, avoid sharing personal items, clean and disinfect "high touch" surfaces, and frequent handwashing) according to CDC guidelines.   5. The patient or parent/caregiver has the option to  accept or refuse bamlanivimab or casirivimab\imdevimab .  After reviewing this information with the patient, The patient agreed to proceed with receiving the casirivimab\imdevimab infusion and will be provided a copy of the Fact sheet prior to receiving the infusion.Brian [name] 01/30/2019 3:54 PM

## 2019-01-30 NOTE — Progress Notes (Unsigned)
Dr. Marin Olp notified that [place] message received from patient stating that he found out he tested positive for COVID today.  Message sent to scheduling to call pt to move scheduled appts out, so that pt comes to office 21 days after exposure per [place].

## 2019-01-30 NOTE — Telephone Encounter (Signed)
Call received from patient in regards to MyChart message sent to him.  Pt states that he has tested positive for Covid, along with his wife, daughter, mother in law and father in law, who is currently admitted.  He states that he was told by [facility] that there is a medication he can have since he is considered high risk and would like to know what he needs to do to get it.  He also states that this is starting his fourth week off of Lenvima and he is still spilling large amounts of protein in his urine, his weight has remained stable at 230, HR has been elevated between 110-120 and BP 140-110 this AM.  Dr. Marin Olp notified.  Dr. Wendy Poet office called regarding elevated BP and nurse from that office will contact pt regarding elevated BP and HR. Patient instructed to call [facility] regarding possible infusion and Covid infusion referral hot line called regarding patient as well.

## 2019-01-30 NOTE — Telephone Encounter (Signed)
Please advise 

## 2019-01-31 NOTE — Telephone Encounter (Signed)
We can add metoprolol25 bid

## 2019-02-02 ENCOUNTER — Telehealth: Payer: Self-pay | Admitting: Emergency Medicine

## 2019-02-02 ENCOUNTER — Ambulatory Visit (HOSPITAL_COMMUNITY)
Admission: RE | Admit: 2019-02-02 | Discharge: 2019-02-02 | Disposition: A | Discharge status: Home / Self Care | Payer: 59 | Source: Ambulatory Visit | Attending: Pulmonary Disease | Admitting: Pulmonary Disease

## 2019-02-02 ENCOUNTER — Ambulatory Visit: Payer: 59 | Admitting: Hematology & Oncology

## 2019-02-02 ENCOUNTER — Other Ambulatory Visit: Payer: 59

## 2019-02-02 DIAGNOSIS — U071 COVID-19: Secondary | ICD-10-CM | POA: Insufficient documentation

## 2019-02-02 MED ORDER — METHYLPREDNISOLONE SODIUM SUCC 125 MG IJ SOLR: 125.0000 mg | Freq: Once | INTRAMUSCULAR | Status: DC | PRN

## 2019-02-02 MED ORDER — DIPHENHYDRAMINE HCL 50 MG/ML IJ SOLN: 50.0000 mg | Freq: Once | INTRAMUSCULAR | Status: DC | PRN

## 2019-02-02 MED ORDER — ALBUTEROL SULFATE HFA 108 (90 BASE) MCG/ACT IN AERS: 2.0000 | INHALATION_SPRAY | Freq: Once | RESPIRATORY_TRACT | Status: DC | PRN

## 2019-02-02 MED ORDER — FAMOTIDINE IN NACL 20-0.9 MG/50ML-% IV SOLN: 20.0000 mg | Freq: Once | INTRAVENOUS | Status: DC | PRN

## 2019-02-02 MED ORDER — SODIUM CHLORIDE 0.9 % IV SOLN
INTRAVENOUS | Status: DC | PRN
  Administered 2019-02-02: 250 mL via INTRAVENOUS

## 2019-02-02 MED ORDER — EPINEPHRINE 0.3 MG/0.3ML IJ SOAJ: 0.3000 mg | Freq: Once | INTRAMUSCULAR | Status: DC | PRN

## 2019-02-02 MED ORDER — SODIUM CHLORIDE 0.9 % IV SOLN
Freq: Once | INTRAVENOUS | Status: AC
  Filled 2019-02-02: qty 10

## 2019-02-02 NOTE — Progress Notes (Signed)
  Diagnosis: COVID-19  Physician: Dr. Joya Gaskins  Procedure: [facility] Infusion [facility] Med: casirivimab\imdevimab infusion - Provided patient with casirivimab\imdevimab fact sheet for patients, parents and caregivers prior to infusion.  Complications: No immediate complications noted.  Discharge: Discharged home   [facility] 02/02/2019

## 2019-02-02 NOTE — Telephone Encounter (Signed)
Called patient to inform him to start metoprolol. However, during the call he reports he has taken himself off clonidine and isosorbide... he wants to know if Dr. Agustin Cree wants him to be taking those or if he can remain off. His bp today was around 140/100. Will consult with Dr. Agustin Cree.

## 2019-02-03 ENCOUNTER — Other Ambulatory Visit (HOSPITAL_COMMUNITY): Payer: Self-pay

## 2019-02-03 ENCOUNTER — Ambulatory Visit: Payer: 59 | Admitting: Hematology & Oncology

## 2019-02-03 ENCOUNTER — Other Ambulatory Visit: Payer: 59

## 2019-02-03 DIAGNOSIS — U071 COVID-19: Secondary | ICD-10-CM | POA: Diagnosis not present

## 2019-02-03 NOTE — Telephone Encounter (Signed)
I did he stop clonidine?.  If he had some side effects that is understandable.  We can try metoprolol without clonidine so lets start metoprolol 25 twice daily

## 2019-02-03 NOTE — Telephone Encounter (Signed)
Please see additional encounter in the chart.

## 2019-02-06 ENCOUNTER — Other Ambulatory Visit: Payer: Self-pay | Admitting: *Deleted

## 2019-02-06 ENCOUNTER — Other Ambulatory Visit: Payer: Self-pay

## 2019-02-06 ENCOUNTER — Ambulatory Visit (HOSPITAL_BASED_OUTPATIENT_CLINIC_OR_DEPARTMENT_OTHER)
Admission: RE | Admit: 2019-02-06 | Discharge: 2019-02-06 | Disposition: A | Discharge status: Home / Self Care | Payer: 59 | Source: Ambulatory Visit | Attending: Cardiology | Admitting: Cardiology

## 2019-02-06 ENCOUNTER — Telehealth: Payer: Self-pay | Admitting: *Deleted

## 2019-02-06 DIAGNOSIS — R809 Proteinuria, unspecified: Secondary | ICD-10-CM

## 2019-02-06 DIAGNOSIS — R0600 Dyspnea, unspecified: Secondary | ICD-10-CM | POA: Insufficient documentation

## 2019-02-06 DIAGNOSIS — I10 Essential (primary) hypertension: Secondary | ICD-10-CM | POA: Diagnosis not present

## 2019-02-06 NOTE — Telephone Encounter (Signed)
Patient calling to request an order for labs to be drawn at his cardiologist Dr. Wendy Poet office on 02/07/2019. They will draw labs that are ordered by dr Marin Olp. Patient aware.

## 2019-02-06 NOTE — Telephone Encounter (Signed)
Brian [name] from the beginning to please call him and ask what his blood pressure is like and what medication exactly is taking now.

## 2019-02-06 NOTE — Progress Notes (Signed)
  Echocardiogram 2D Echocardiogram has been performed.  Cardell Peach 02/06/2019, 12:04 PM

## 2019-02-07 ENCOUNTER — Other Ambulatory Visit: Payer: 59

## 2019-02-07 LAB — BASIC METABOLIC PANEL
BUN/Creatinine Ratio: 12 (ref 9–20)
BUN: 8 mg/dL (ref 6–24)
CO2: 25 mmol/L (ref 20–29)
Calcium: 8.7 mg/dL (ref 8.7–10.2)
Chloride: 106 mmol/L (ref 96–106)
Creatinine, Ser: 0.69 mg/dL — ABNORMAL LOW (ref 0.76–1.27)
GFR calc Af Amer: 134 mL/min/{1.73_m2} (ref >59)
GFR calc non Af Amer: 116 mL/min/{1.73_m2} (ref >59)
Glucose: 69 mg/dL (ref 65–99)
Potassium: 4.1 mmol/L (ref 3.5–5.2)
Sodium: 141 mmol/L (ref 134–144)

## 2019-02-07 NOTE — Telephone Encounter (Signed)
Called patient informed him of Dr. Wendy Poet recommendation to continue current management for now and let us know if blood pressure gets higher than 140/90. Patient verbally understands.

## 2019-02-07 NOTE — Telephone Encounter (Signed)
Called patient back Medications are updated in chart as he is taking. Clonidine is not be taken he is holding for emergency use only. Lasix is only used PRN (swelling), metolazone is only used PRN, Potassium is used with lasix prn. Currently not taking Lenvima. He started isosorbide mononitrate back because his diastolic bp's were around 100. I advised him to continue to monitor. Will Check with Dr. Agustin Cree if he has further recommendations.

## 2019-02-07 NOTE — Telephone Encounter (Signed)
Agree with present medications.  In the future if his blood pressure becomes a problem we will be able to double the dose of losartan.  He takes 25 mg twice daily in the future we may go 50 mg twice daily but if we decide to do that we will need to check his kidney function.  It is fine to continue with isosorbide.  Please let us know if blood pressure is more than 140/90

## 2019-02-09 DIAGNOSIS — C73 Malignant neoplasm of thyroid gland: Secondary | ICD-10-CM | POA: Diagnosis not present

## 2019-02-09 DIAGNOSIS — C787 Secondary malignant neoplasm of liver and intrahepatic bile duct: Secondary | ICD-10-CM | POA: Diagnosis not present

## 2019-02-09 DIAGNOSIS — C770 Secondary and unspecified malignant neoplasm of lymph nodes of head, face and neck: Secondary | ICD-10-CM | POA: Diagnosis not present

## 2019-02-14 ENCOUNTER — Other Ambulatory Visit: Payer: Self-pay

## 2019-02-14 ENCOUNTER — Encounter: Payer: Self-pay | Admitting: Cardiology

## 2019-02-14 ENCOUNTER — Ambulatory Visit (INDEPENDENT_AMBULATORY_CARE_PROVIDER_SITE_OTHER): Payer: 59 | Admitting: Cardiology

## 2019-02-14 ENCOUNTER — Telehealth: Payer: Self-pay | Admitting: Cardiology

## 2019-02-14 VITALS — BP 136/84 | HR 94 | Ht 70.0 in | Wt 250.0 lb

## 2019-02-14 DIAGNOSIS — I10 Essential (primary) hypertension: Secondary | ICD-10-CM

## 2019-02-14 DIAGNOSIS — R809 Proteinuria, unspecified: Secondary | ICD-10-CM | POA: Diagnosis not present

## 2019-02-14 DIAGNOSIS — C73 Malignant neoplasm of thyroid gland: Secondary | ICD-10-CM | POA: Diagnosis not present

## 2019-02-14 DIAGNOSIS — I4949 Other premature depolarization: Secondary | ICD-10-CM

## 2019-02-14 MED ORDER — COLCHICINE 0.6 MG PO TABS: 0.6000 mg | ORAL_TABLET | ORAL | 3 refills | Status: DC | PRN

## 2019-02-14 MED ORDER — CLONIDINE HCL 0.1 MG PO TABS: 0.1000 mg | ORAL_TABLET | Freq: Two times a day (BID) | ORAL | 11 refills | Status: DC

## 2019-02-14 MED ORDER — ISOSORBIDE MONONITRATE ER 60 MG PO TB24: 60.0000 mg | ORAL_TABLET | Freq: Every day | ORAL | 3 refills | Status: DC

## 2019-02-14 NOTE — Telephone Encounter (Signed)
    Pt c/o medication issue:  1. Name of Medication: COLCHICINE  2. How are you currently taking this medication (dosage and times per day)? N/A  3. Are you having a reaction (difficulty breathing--STAT)? NO  4. What is your medication issue? Patient calling, wants to know why colchicine was prescribed

## 2019-02-14 NOTE — Patient Instructions (Addendum)
Medication Instructions:  START Isosorbide 60 mg by mouth daily. START clonidine 0.1 mg by mouth twice daily.   *If you need a refill on your cardiac medications before your next appointment, please call your pharmacy*  Lab Work: We will go ahead and draw the labs you have pending   If you have labs (blood work) drawn today and your tests are completely normal, you will receive your results only by: [place] MyChart Message (if you have MyChart) OR . A paper copy in the mail If you have any lab test that is abnormal or we need to change your treatment, we will call you to review the results.  Testing/Procedures: None ordered today  Follow-Up: At [facility], you and your health needs are our priority.  As part of our continuing mission to provide you with exceptional heart care, we have created designated Provider Care Teams.  These Care Teams include your primary Cardiologist (physician) and Advanced Practice Providers (APPs -  Physician Assistants and Nurse Practitioners) who all work together to provide you with the care you need, when you need it.  Your next appointment:   2 month(s)  The format for your next appointment:   In Person  Provider:   Jenne Campus, MD  Other Instructions None

## 2019-02-14 NOTE — Progress Notes (Signed)
Cardiology Office Note:    Date:  02/14/2019   ID:  Brian [name], DOB 1975-10-06, MRN [id number]  PCP:  Sharyne Richters, MD  Cardiologist:  Jenne Campus, MD    Referring MD: Sharyne Richters, MD   Chief Complaint  Patient presents with  . Follow-up    1 MO FU   Doing better  History of Present Illness:    Brian [name] is a 43 y.o. male complex past medical history.  Thyroid cancer with metastasis.  That being managed excellently like always by our oncology team.  However, medication he was taking for his cancer was making his blood pressure very high that being discontinued temporarily his blood pressure is much better with trying to establish baseline medications for management of his blood pressure.  So he is on losartan 25 twice daily, isosorbide mononitrate 60 mg daily, he takes clonidine on as-needed basis which is usually once or twice a week.  We will continue with this management.  In the meantime he ended up being sick with Covid 19.  Did well with that.  Received monoclonal antibody for it.  Denies have any palpitations no shortness of breath overall doing quite well now.  Past Medical History:  Diagnosis Date  . Essential hypertension 09/09/2018  . Kidney stone   . Primary cancer of thyroid with metastasis to other site ([place]) 05/04/2018  . Rickettsia infection     Past Surgical History:  Procedure Laterality Date  . BIOPSY  04/05/2018   Procedure: BIOPSY;  Surgeon: Ronald Lobo, MD;  Location: [facility];  Service: Endoscopy;;  . CHOLECYSTECTOMY    . ESOPHAGOGASTRODUODENOSCOPY (EGD) WITH PROPOFOL N/A 04/05/2018   Procedure: ESOPHAGOGASTRODUODENOSCOPY (EGD) WITH PROPOFOL;  Surgeon: Ronald Lobo, MD;  Location: [facility];  Service: Endoscopy;  Laterality: N/A;  . IR RADIOLOGIST EVAL & MGMT  10/11/2018    Current Medications: Current Meds  Medication Sig  . cloNIDine (CATAPRES) 0.2 MG tablet Take 2 tablets (0.4 mg total) by mouth 2 (two) times daily. (Patient  taking differently: Take 0.4 mg by mouth See admin instructions. Only taking if needed per patient)  . cyclobenzaprine (FLEXERIL) 10 MG tablet Take 10 mg by mouth 3 (three) times daily as needed for muscle spasms.  Marland Kitchen EPINEPHrine (EPIPEN 2-PAK) 0.3 mg/0.3 mL IJ SOAJ injection USE AS DIRECTED FOR LIFE THREATENING ALLERGIC REACTIONS  . furosemide (LASIX) 80 MG tablet Take 1 tablet (80 mg total) by mouth daily. (Patient taking differently: Take 80 mg by mouth as needed. )  . isosorbide mononitrate (ISMO) 20 MG tablet Take 1 tablet (20 mg total) by mouth 2 (two) times daily at 10 AM and 5 PM.  . levothyroxine (SYNTHROID) 200 MCG tablet Take 1 tablet (200 mcg total) by mouth daily before breakfast.  . levothyroxine (SYNTHROID) 75 MCG tablet Take 75 mcg by mouth daily.  Marland Kitchen losartan (COZAAR) 25 MG tablet Take 1 tablet (25 mg total) by mouth 2 (two) times daily.  . metolazone (ZAROXOLYN) 5 MG tablet Take 1 tablet (5 mg total) by mouth daily. Take 1 hour before Lasix. (Patient taking differently: Take 5 mg by mouth as needed. Take 1 hour before Lasix.)  . ondansetron (ZOFRAN) 8 MG tablet Take 8 mg by mouth every 8 (eight) hours as needed for nausea or vomiting.   Marland Kitchen oxyCODONE (OXY IR/ROXICODONE) 5 MG immediate release tablet Take 1 tablet (5 mg total) by mouth every 6 (six) hours as needed for severe pain.  . potassium chloride SA (KLOR-CON) 10  MEQ tablet Take 1 tablet (10 mEq total) by mouth daily. (Patient taking differently: Take 10 mEq by mouth as needed. Prn with lasix)  . promethazine (PHENERGAN) 12.5 MG tablet Take 1 tablet (12.5 mg total) by mouth at bedtime.     Allergies:   Dextromethorphan-guaifenesin, Doxycycline, Gadobutrol, Gadolinium derivatives, Guaifenesin, Ibuprofen, Lisinopril, Pantoprazole, and Tramadol   Social History   Socioeconomic History  . Marital status: Married    Spouse name: Not on file  . Number of children: Not on file  . Years of education: Not on file  . Highest  education level: Not on file  Occupational History  . Not on file  Tobacco Use  . Smoking status: Never Smoker  . Smokeless tobacco: Never Used  Substance and Sexual Activity  . Alcohol use: No  . Drug use: No  . Sexual activity: Not on file  Other Topics Concern  . Not on file  Social History Narrative  . Not on file   Social Determinants of Health   Financial Resource Strain:   . Difficulty of Paying Living Expenses: Not on file  Food Insecurity:   . Worried About Charity fundraiser in the Last Year: Not on file  . Ran Out of Food in the Last Year: Not on file  Transportation Needs:   . Lack of Transportation (Medical): Not on file  . Lack of Transportation (Non-Medical): Not on file  Physical Activity:   . Days of Exercise per Week: Not on file  . Minutes of Exercise per Session: Not on file  Stress:   . Feeling of Stress : Not on file  Social Connections:   . Frequency of Communication with Friends and Family: Not on file  . Frequency of Social Gatherings with Friends and Family: Not on file  . Attends Religious Services: Not on file  . Active Member of Clubs or Organizations: Not on file  . Attends Archivist Meetings: Not on file  . Marital Status: Not on file     Family History: The patient's family history includes Allergic rhinitis in his father; Diabetes in his mother; Heart attack in his mother; Heart disease in his mother; Heart failure in his mother; Hyperlipidemia in his father and mother; Hypertension in his mother; Rashes / Skin problems in his father. There is no history of Sudden death or Thyroid disease. ROS:   Please see the history of present illness.    All 14 point review of systems negative except as described per history of present illness  EKGs/Labs/Other Studies Reviewed:      Recent Labs: 09/12/2018: Magnesium 2.1 11/02/2018: B Natriuretic Peptide 117.9 12/22/2018: TSH 51.265 01/16/2019: ALT 14; Hemoglobin 15.2; Platelet Count  201 02/06/2019: BUN 8; Creatinine, Ser 0.69; Potassium 4.1; Sodium 141  Recent Lipid Panel    Component Value Date/Time   CHOL  10/08/2008 0655    119        ATP III CLASSIFICATION:  <200     mg/dL   Desirable  200-239  mg/dL   Borderline High  >=240    mg/dL   High          TRIG 84 10/08/2008 0655   HDL 20 (L) 10/08/2008 0655   CHOLHDL 6.0 10/08/2008 0655   VLDL 17 10/08/2008 0655   LDLCALC  10/08/2008 0655    82        Total Cholesterol/HDL:CHD Risk Coronary Heart Disease Risk Table  Men   Women  1/2 Average Risk   3.4   3.3  Average Risk       5.0   4.4  2 X Average Risk   9.6   7.1  3 X Average Risk  23.4   11.0        Use the calculated Patient Ratio above and the CHD Risk Table to determine the patient's CHD Risk.        ATP III CLASSIFICATION (LDL):  <100     mg/dL   Optimal  100-129  mg/dL   Near or Above                    Optimal  130-159  mg/dL   Borderline  160-189  mg/dL   High  >190     mg/dL   Very High    Physical Exam:    VS:  BP 136/84   Pulse 94   Ht 5\' 10"  (1.778 m)   Wt 250 lb (113.4 kg)   SpO2 98%   BMI 35.87 kg/m     Wt Readings from Last 3 Encounters:  02/14/19 250 lb (113.4 kg)  01/16/19 240 lb (108.9 kg)  01/16/19 240 lb (108.9 kg)     GEN:  Well nourished, well developed in no acute distress HEENT: Normal NECK: No JVD; No carotid bruits LYMPHATICS: No lymphadenopathy CARDIAC: RRR, no murmurs, no rubs, no gallops RESPIRATORY:  Clear to auscultation without rales, wheezing or rhonchi  ABDOMEN: Soft, non-tender, non-distended MUSCULOSKELETAL:  No edema; No deformity  SKIN: Warm and dry LOWER EXTREMITIES: no swelling NEUROLOGIC:  Alert and oriented x 3 PSYCHIATRIC:  Normal affect   ASSESSMENT:    1. Essential hypertension   2. Extrasystole   3. Primary cancer of thyroid with metastasis to other site [place])    PLAN:    In order of problems listed above:  1. Essential hypertension blood pressure  appears to be controlled right now we will continue present management 2. Extrasystole denies having any problem from that aspect 3. Cancer with metastasis: Managed by oncology team.  Plan is to continue with present management.  Will manage his blood pressure when he starts with medication again with clonidine 0.1 on as-needed basis.   Medication Adjustments/Labs and Tests Ordered: Current medicines are reviewed at length with the patient today.  Concerns regarding medicines are outlined above.  No orders of the defined types were placed in this encounter.  Medication changes: No orders of the defined types were placed in this encounter.   Signed, Park Liter, MD, [facility] 02/14/2019 11:05 AM    [place]

## 2019-02-15 ENCOUNTER — Telehealth: Payer: Self-pay

## 2019-02-15 NOTE — Telephone Encounter (Signed)
Refill sent yesterday during office visit.  Loel Dubonnet, NP

## 2019-02-15 NOTE — Telephone Encounter (Signed)
Pt pharmacy called asking why he was prescribed colchicine 0.6 mg. Medication order was an error. Pt does not need cochicine, pt verbalized understanding at the time of his visit.

## 2019-02-22 ENCOUNTER — Telehealth: Payer: Self-pay | Admitting: Cardiology

## 2019-02-22 ENCOUNTER — Other Ambulatory Visit: Payer: Self-pay

## 2019-02-22 ENCOUNTER — Inpatient Hospital Stay: Payer: 59 | Attending: Hematology & Oncology

## 2019-02-22 DIAGNOSIS — R809 Proteinuria, unspecified: Secondary | ICD-10-CM | POA: Diagnosis not present

## 2019-02-22 DIAGNOSIS — C73 Malignant neoplasm of thyroid gland: Secondary | ICD-10-CM | POA: Diagnosis not present

## 2019-02-22 DIAGNOSIS — C787 Secondary malignant neoplasm of liver and intrahepatic bile duct: Secondary | ICD-10-CM | POA: Diagnosis not present

## 2019-02-22 DIAGNOSIS — Z8616 Personal history of COVID-19: Secondary | ICD-10-CM | POA: Insufficient documentation

## 2019-02-22 DIAGNOSIS — I10 Essential (primary) hypertension: Secondary | ICD-10-CM | POA: Diagnosis not present

## 2019-02-22 LAB — PROTEIN, URINE, 24 HOUR
Collection Interval-UPROT: 24 hours
Protein, 24H Urine: 3724 mg/d — ABNORMAL HIGH (ref 50–100)
Protein, Urine: 133 mg/dL
Urine Total Volume-UPROT: 2800 mL

## 2019-02-22 NOTE — Telephone Encounter (Signed)
Patient had labs done in December but they have not populated to my chart, please advise

## 2019-02-22 NOTE — Telephone Encounter (Signed)
Left message for patient to return call.

## 2019-02-22 NOTE — Telephone Encounter (Signed)
Patient called back he reports he had labs drawn on 02/14/2019 visit and has never seen results. Will investigate as there are no results and orders look as if they were never released.

## 2019-02-23 ENCOUNTER — Encounter: Payer: Self-pay | Admitting: *Deleted

## 2019-03-01 NOTE — Telephone Encounter (Signed)
Called patient he reports [facility] sent him the results. No further questions at this time.

## 2019-03-02 ENCOUNTER — Other Ambulatory Visit: Payer: Self-pay | Admitting: Cardiology

## 2019-03-02 ENCOUNTER — Other Ambulatory Visit: Payer: Self-pay

## 2019-03-02 ENCOUNTER — Encounter: Payer: Self-pay | Admitting: Hematology & Oncology

## 2019-03-02 ENCOUNTER — Inpatient Hospital Stay (HOSPITAL_BASED_OUTPATIENT_CLINIC_OR_DEPARTMENT_OTHER): Payer: 59 | Admitting: Hematology & Oncology

## 2019-03-02 ENCOUNTER — Inpatient Hospital Stay: Payer: 59

## 2019-03-02 VITALS — BP 96/49 | HR 99 | Temp 97.9°F | Resp 20 | Wt 247.0 lb

## 2019-03-02 DIAGNOSIS — R809 Proteinuria, unspecified: Secondary | ICD-10-CM | POA: Diagnosis not present

## 2019-03-02 DIAGNOSIS — C73 Malignant neoplasm of thyroid gland: Secondary | ICD-10-CM | POA: Diagnosis not present

## 2019-03-02 DIAGNOSIS — I10 Essential (primary) hypertension: Secondary | ICD-10-CM | POA: Diagnosis not present

## 2019-03-02 DIAGNOSIS — Z8616 Personal history of COVID-19: Secondary | ICD-10-CM | POA: Diagnosis not present

## 2019-03-02 DIAGNOSIS — C787 Secondary malignant neoplasm of liver and intrahepatic bile duct: Secondary | ICD-10-CM | POA: Diagnosis not present

## 2019-03-02 LAB — CBC WITH DIFFERENTIAL (CANCER CENTER ONLY)
Abs Immature Granulocytes: 0.02 K/uL (ref 0.00–0.07)
Basophils Absolute: 0 K/uL (ref 0.0–0.1)
Basophils Relative: 1 %
Eosinophils Absolute: 0.1 K/uL (ref 0.0–0.5)
Eosinophils Relative: 1 %
HCT: 38 % — ABNORMAL LOW (ref 39.0–52.0)
Hemoglobin: 12.7 g/dL — ABNORMAL LOW (ref 13.0–17.0)
Immature Granulocytes: 1 %
Lymphocytes Relative: 42 %
Lymphs Abs: 1.7 K/uL (ref 0.7–4.0)
MCH: 30 pg (ref 26.0–34.0)
MCHC: 33.4 g/dL (ref 30.0–36.0)
MCV: 89.8 fL (ref 80.0–100.0)
Monocytes Absolute: 0.4 K/uL (ref 0.1–1.0)
Monocytes Relative: 10 %
Neutro Abs: 1.8 K/uL (ref 1.7–7.7)
Neutrophils Relative %: 45 %
Platelet Count: 221 K/uL (ref 150–400)
RBC: 4.23 MIL/uL (ref 4.22–5.81)
RDW: 13.4 % (ref 11.5–15.5)
WBC Count: 3.9 K/uL — ABNORMAL LOW (ref 4.0–10.5)
nRBC: 0 % (ref 0.0–0.2)

## 2019-03-02 LAB — URINALYSIS, COMPLETE (UACMP) WITH MICROSCOPIC
Bilirubin Urine: NEGATIVE
Glucose, UA: NEGATIVE mg/dL
Hgb urine dipstick: NEGATIVE
Ketones, ur: NEGATIVE mg/dL
Leukocytes,Ua: NEGATIVE
Nitrite: NEGATIVE
Protein, ur: 100 mg/dL — AB
Specific Gravity, Urine: 1.025 (ref 1.005–1.030)
pH: 5.5 (ref 5.0–8.0)

## 2019-03-02 LAB — COMPREHENSIVE METABOLIC PANEL
ALT: 16 U/L (ref 0–44)
AST: 15 U/L (ref 15–41)
Albumin: 3.6 g/dL (ref 3.5–5.0)
Alkaline Phosphatase: 63 U/L (ref 38–126)
Anion gap: 4 — ABNORMAL LOW (ref 5–15)
BUN: 21 mg/dL — ABNORMAL HIGH (ref 6–20)
CO2: 30 mmol/L (ref 22–32)
Calcium: 9 mg/dL (ref 8.9–10.3)
Chloride: 104 mmol/L (ref 98–111)
Creatinine, Ser: 0.92 mg/dL (ref 0.61–1.24)
GFR calc Af Amer: >60 mL/min (ref >60)
GFR calc non Af Amer: >60 mL/min (ref >60)
Glucose, Bld: 105 mg/dL — ABNORMAL HIGH (ref 70–99)
Potassium: 4 mmol/L (ref 3.5–5.1)
Sodium: 138 mmol/L (ref 135–145)
Total Bilirubin: 0.7 mg/dL (ref 0.3–1.2)
Total Protein: 5.3 g/dL — ABNORMAL LOW (ref 6.5–8.1)

## 2019-03-02 LAB — TSH: TSH: <0.08 u[IU]/mL — ABNORMAL LOW (ref 0.320–4.118)

## 2019-03-02 LAB — T4, FREE: Free T4: 1.59 ng/dL — ABNORMAL HIGH (ref 0.61–1.12)

## 2019-03-02 LAB — LACTATE DEHYDROGENASE: LDH: 153 U/L (ref 98–192)

## 2019-03-02 NOTE — Progress Notes (Signed)
Hematology and Oncology Follow Up Visit  Brian [name] [id number] 1975/11/22 44 y.o. 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 is back for for follow-up.  So far, everything is going pretty well with him.  He has been off the [facility] for a couple months.  The proteinuria has improved.  His last 24-hour urine for protein was down to 3700 mg of protein.  He has never had problems with his BUN or creatinine.  His hypertension is now very well controlled.  Today, his blood pressure is 94/50.  Hopefully, he will be able to have his blood pressure medicines cut back a little bit.  He has had no problems with cough.  He has had no nausea or vomiting.  Unfortunately back earlier this month, he had the coronavirus.  Both he and his wife had it.  Thankfully, his symptoms were relatively mild.  He has had no issues with bleeding.  He is working.  He is put in quite a few hours a week.  He has had no rashes.  He has had little bit of edema in the legs.  He takes Lasix for this.  I think you goes back to see his oncologist at [facility] sometime in February.  I will do another 24-hour urine on him next week so we see if his protein is down to less than 2000 mg.  If so, we might want to restart the Lenvima at a lower dose.  Currently, I would say his performance status is ECOG 1.      Medications:  Current Outpatient Medications:  .  cloNIDine (CATAPRES) 0.1 MG tablet, Take 1 tablet (0.1 mg total) by mouth 2 (two) times daily., Disp: 60 tablet, Rfl: 11 .  cyclobenzaprine (FLEXERIL) 10 MG tablet, Take 10 mg by mouth 3 (three) times daily as needed for muscle spasms., Disp: , Rfl:  .  EPINEPHrine (EPIPEN 2-PAK) 0.3 mg/0.3 mL IJ SOAJ injection, USE AS DIRECTED FOR LIFE THREATENING ALLERGIC REACTIONS, Disp:  4 Device, Rfl: 3 .  furosemide (LASIX) 80 MG tablet, Take 1 tablet (80 mg total) by mouth daily. (Patient taking differently: Take 80 mg by mouth as needed. ), Disp: 30 tablet, Rfl: 1 .  isosorbide mononitrate (IMDUR) 60 MG 24 hr tablet, Take 1 tablet (60 mg total) by mouth daily., Disp: 90 tablet, Rfl: 3 .  LENVIMA, 24 MG DAILY DOSE, 2 x 10 MG & 4 MG capsule, Take 24 mg by mouth at bedtime. , Disp: , Rfl:  .  levothyroxine (SYNTHROID) 200 MCG tablet, Take 1 tablet (200 mcg total) by mouth daily before breakfast., Disp: 90 tablet, Rfl: 3 .  levothyroxine (SYNTHROID) 75 MCG tablet, Take 75 mcg by mouth daily., Disp: , Rfl:  .  losartan (COZAAR) 25 MG tablet, Take 1 tablet (25 mg total) by mouth 2 (two) times daily., Disp: 60 tablet, Rfl: 1 .  metolazone (ZAROXOLYN) 5 MG tablet, Take 1 tablet (5 mg total) by mouth daily. Take 1 hour before Lasix. (Patient taking differently: Take 5 mg by mouth as needed. Take 1 hour before Lasix.), Disp: 30 tablet, Rfl: 1 .  ondansetron (ZOFRAN) 8 MG tablet, Take 8 mg by mouth every 8 (eight) hours as needed for nausea or vomiting. , Disp: ,  Rfl:  .  oxyCODONE (OXY IR/ROXICODONE) 5 MG immediate release tablet, Take 1 tablet (5 mg total) by mouth every 6 (six) hours as needed for severe pain., Disp: 120 tablet, Rfl: 0 .  potassium chloride SA (KLOR-CON) 10 MEQ tablet, Take 1 tablet (10 mEq total) by mouth daily. (Patient taking differently: Take 10 mEq by mouth as needed. Prn with lasix), Disp: 30 tablet, Rfl: 1 .  promethazine (PHENERGAN) 12.5 MG tablet, Take 1 tablet (12.5 mg total) by mouth at bedtime., Disp: 60 tablet, Rfl: 3  Allergies:  Allergies  Allergen Reactions  . Dextromethorphan-Guaifenesin Other (See Comments)    Irregular heartbeat   . Doxycycline Anaphylaxis, Nausea And Vomiting, Rash and Other (See Comments)    "heart arrythmia" and "dyspepsia" (only oral doxycycline causes reaction)  . Gadobutrol Hives and Other (See Comments)    Patient had MRI  scan at [place]. Patient called one hour after he left imaging facility to report two "blisters" that came up on "back" of lip.    . Gadolinium Derivatives Hives and Other (See Comments)    Patient had MRI scan at [place]. Patient called one hour after he left imaging facility to report two "blisters" that came up on "back" of lip.   . Guaifenesin Palpitations       . Ibuprofen Hives and Itching  . Lisinopril Other (See Comments)    Angioedema  . Pantoprazole Itching  . Tramadol Hives and Itching    Past Medical History, Surgical history, Social history, and Family History were reviewed and updated.  Review of Systems: Review of Systems  Constitutional: Negative.   HENT:   Positive for lump/mass and sore throat.   Eyes: Negative.   Respiratory: Negative.   Cardiovascular: Negative.   Gastrointestinal: Negative.   Endocrine: Negative.   Genitourinary: Negative.    Musculoskeletal: Negative.   Skin: Negative.   Neurological: Negative.   Hematological: Negative.   Psychiatric/Behavioral: Negative.     Physical Exam:  vitals were not taken for this visit.   Wt Readings from Last 3 Encounters:  02/14/19 250 lb (113.4 kg)  01/16/19 240 lb (108.9 kg)  01/16/19 240 lb (108.9 kg)    Physical Exam Vitals reviewed.  HENT:     Head: Normocephalic and atraumatic.  Eyes:     Pupils: Pupils are equal, round, and reactive to light.  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  This is well-healing.  There is some slight fullness but no distinct mass noted in the neck. Cardiovascular:     Rate and Rhythm: Normal rate and regular rhythm.     Heart sounds: Normal heart sounds.  Pulmonary:     Effort: Pulmonary effort is normal.     Breath sounds: Normal breath sounds.  Abdominal:     General: Bowel sounds are normal.     Palpations: Abdomen is soft.  Musculoskeletal:        General: Swelling present.  No tenderness or deformity. Normal range of motion.     Cervical back: Normal range of motion.  Lymphadenopathy:     Cervical: No cervical adenopathy.  Skin:    General: Skin is warm and dry.     Findings: No erythema or rash.  Neurological:     Mental Status: He is alert and oriented to person, place, and time.  Psychiatric:        Behavior: Behavior normal.  Thought Content: Thought content normal.        Judgment: Judgment normal.      Lab Results  Component Value Date   WBC 7.1 01/16/2019   HGB 15.2 01/16/2019   HCT 46.0 01/16/2019   MCV 91.6 01/16/2019   PLT 201 01/16/2019     Chemistry      Component Value Date/Time   NA 141 02/06/2019 1210   K 4.1 02/06/2019 1210   CL 106 02/06/2019 1210   CO2 25 02/06/2019 1210   BUN 8 02/06/2019 1210   CREATININE 0.69 (L) 02/06/2019 1210   CREATININE 0.90 01/16/2019 1308      Component Value Date/Time   CALCIUM 8.7 02/06/2019 1210   ALKPHOS 77 01/16/2019 1308   AST 18 01/16/2019 1308   ALT 14 01/16/2019 1308   BILITOT 0.5 01/16/2019 1308      Impression and Plan: [name] is a 43 year old white male.  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  He is being followed by the endocrine malignancy clinic at [facility]    For right now, we will have to follow him closely.  Hopefully, the 24-hour urine protein that he will do will show that he has less than 2000 mg a day of protein excretion.  Hopefully, with the blood pressure being better controlled, this will also decrease his urinary protein excretion.  We will plan to see him back in another month.  By then, he might have gone back to see his oncologist at [facility].  [name], MD 1/14/20218:05 AM

## 2019-03-08 ENCOUNTER — Inpatient Hospital Stay: Payer: 59

## 2019-03-08 DIAGNOSIS — I10 Essential (primary) hypertension: Secondary | ICD-10-CM | POA: Diagnosis not present

## 2019-03-08 DIAGNOSIS — C787 Secondary malignant neoplasm of liver and intrahepatic bile duct: Secondary | ICD-10-CM | POA: Diagnosis not present

## 2019-03-08 DIAGNOSIS — R809 Proteinuria, unspecified: Secondary | ICD-10-CM | POA: Diagnosis not present

## 2019-03-08 DIAGNOSIS — C73 Malignant neoplasm of thyroid gland: Secondary | ICD-10-CM

## 2019-03-08 DIAGNOSIS — Z8616 Personal history of COVID-19: Secondary | ICD-10-CM | POA: Diagnosis not present

## 2019-03-08 LAB — PROTEIN, URINE, 24 HOUR
Collection Interval-UPROT: 24 hours
Protein, 24H Urine: 2376 mg/d — ABNORMAL HIGH (ref 50–100)
Protein, Urine: 144 mg/dL
Urine Total Volume-UPROT: 1650 mL

## 2019-03-09 ENCOUNTER — Telehealth: Payer: Self-pay | Admitting: *Deleted

## 2019-03-09 NOTE — Telephone Encounter (Signed)
Call received back from patient.  Patient notified per order of Dr. Marin Olp that "the urinary protein is down to 2400 mg."  Pt appreciative of results.

## 2019-03-09 NOTE — Telephone Encounter (Signed)
-----   Message from [name], MD sent at 03/09/2019  7:04 AM EST ----- Call - the urinary protein is down to 2400 mg.  pete

## 2019-03-10 DIAGNOSIS — C73 Malignant neoplasm of thyroid gland: Secondary | ICD-10-CM | POA: Diagnosis not present

## 2019-03-10 DIAGNOSIS — Z0000 Encounter for general adult medical examination without abnormal findings: Secondary | ICD-10-CM | POA: Diagnosis not present

## 2019-03-10 DIAGNOSIS — Z91018 Allergy to other foods: Secondary | ICD-10-CM | POA: Diagnosis not present

## 2019-03-10 DIAGNOSIS — Z23 Encounter for immunization: Secondary | ICD-10-CM | POA: Diagnosis not present

## 2019-03-10 DIAGNOSIS — C799 Secondary malignant neoplasm of unspecified site: Secondary | ICD-10-CM | POA: Diagnosis not present

## 2019-03-16 DIAGNOSIS — Z5111 Encounter for antineoplastic chemotherapy: Secondary | ICD-10-CM | POA: Diagnosis not present

## 2019-03-16 DIAGNOSIS — C73 Malignant neoplasm of thyroid gland: Secondary | ICD-10-CM | POA: Diagnosis not present

## 2019-03-16 DIAGNOSIS — C799 Secondary malignant neoplasm of unspecified site: Secondary | ICD-10-CM | POA: Diagnosis not present

## 2019-03-23 ENCOUNTER — Other Ambulatory Visit: Payer: Self-pay

## 2019-03-23 ENCOUNTER — Encounter: Payer: Self-pay | Admitting: Hematology & Oncology

## 2019-03-23 ENCOUNTER — Inpatient Hospital Stay: Payer: 59 | Attending: Hematology & Oncology | Admitting: Hematology & Oncology

## 2019-03-23 ENCOUNTER — Inpatient Hospital Stay: Payer: 59

## 2019-03-23 ENCOUNTER — Encounter: Payer: Self-pay | Admitting: *Deleted

## 2019-03-23 VITALS — BP 119/78 | HR 81 | Temp 97.7°F | Resp 18 | Ht 70.0 in | Wt 250.8 lb

## 2019-03-23 DIAGNOSIS — R0609 Other forms of dyspnea: Secondary | ICD-10-CM

## 2019-03-23 DIAGNOSIS — C73 Malignant neoplasm of thyroid gland: Secondary | ICD-10-CM

## 2019-03-23 DIAGNOSIS — R0600 Dyspnea, unspecified: Secondary | ICD-10-CM | POA: Diagnosis not present

## 2019-03-23 DIAGNOSIS — C787 Secondary malignant neoplasm of liver and intrahepatic bile duct: Secondary | ICD-10-CM | POA: Insufficient documentation

## 2019-03-23 LAB — URINALYSIS, COMPLETE (UACMP) WITH MICROSCOPIC
Bilirubin Urine: NEGATIVE
Glucose, UA: NEGATIVE mg/dL
Hgb urine dipstick: NEGATIVE
Ketones, ur: NEGATIVE mg/dL
Leukocytes,Ua: NEGATIVE
Nitrite: NEGATIVE
Protein, ur: 100 mg/dL — AB
Specific Gravity, Urine: >1.03 — ABNORMAL HIGH (ref 1.005–1.030)
pH: 5.5 (ref 5.0–8.0)

## 2019-03-23 LAB — CMP (CANCER CENTER ONLY)
ALT: 15 U/L (ref 0–44)
AST: 17 U/L (ref 15–41)
Albumin: 4 g/dL (ref 3.5–5.0)
Alkaline Phosphatase: 64 U/L (ref 38–126)
Anion gap: 5 (ref 5–15)
BUN: 28 mg/dL — ABNORMAL HIGH (ref 6–20)
CO2: 32 mmol/L (ref 22–32)
Calcium: 9.5 mg/dL (ref 8.9–10.3)
Chloride: 102 mmol/L (ref 98–111)
Creatinine: 1.05 mg/dL (ref 0.61–1.24)
GFR, Est AFR Am: >60 mL/min (ref >60)
GFR, Estimated: >60 mL/min (ref >60)
Glucose, Bld: 104 mg/dL — ABNORMAL HIGH (ref 70–99)
Potassium: 4.2 mmol/L (ref 3.5–5.1)
Sodium: 139 mmol/L (ref 135–145)
Total Bilirubin: 0.8 mg/dL (ref 0.3–1.2)
Total Protein: 6.1 g/dL — ABNORMAL LOW (ref 6.5–8.1)

## 2019-03-23 LAB — CBC WITH DIFFERENTIAL (CANCER CENTER ONLY)
Abs Immature Granulocytes: 0.01 10*3/uL (ref 0.00–0.07)
Basophils Absolute: 0 10*3/uL (ref 0.0–0.1)
Basophils Relative: 1 %
Eosinophils Absolute: 0.1 10*3/uL (ref 0.0–0.5)
Eosinophils Relative: 1 %
HCT: 38.4 % — ABNORMAL LOW (ref 39.0–52.0)
Hemoglobin: 13.1 g/dL (ref 13.0–17.0)
Immature Granulocytes: 0 %
Lymphocytes Relative: 29 %
Lymphs Abs: 1.3 10*3/uL (ref 0.7–4.0)
MCH: 29.9 pg (ref 26.0–34.0)
MCHC: 34.1 g/dL (ref 30.0–36.0)
MCV: 87.7 fL (ref 80.0–100.0)
Monocytes Absolute: 0.4 10*3/uL (ref 0.1–1.0)
Monocytes Relative: 9 %
Neutro Abs: 2.7 10*3/uL (ref 1.7–7.7)
Neutrophils Relative %: 60 %
Platelet Count: 189 10*3/uL (ref 150–400)
RBC: 4.38 MIL/uL (ref 4.22–5.81)
RDW: 12.3 % (ref 11.5–15.5)
WBC Count: 4.5 10*3/uL (ref 4.0–10.5)
nRBC: 0 % (ref 0.0–0.2)

## 2019-03-23 LAB — LACTATE DEHYDROGENASE: LDH: 165 U/L (ref 98–192)

## 2019-03-23 NOTE — Progress Notes (Signed)
Hematology and Oncology Follow Up Visit  Brian [name] [id number] September 07, 1975 44 y.o. 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  He seems to be improving slowly but surely.  We did a 24-hour urine on him when he saw him last time.  The 24-hour urine protein excretion was about 2300 mg.  He did see his oncologist at [facility].  She thinks that he can restart the Lenvima but at a 10 mg daily dose.  I think he will start that today.  His blood pressure is doing so much better.  We will have to see how his blood pressure trends when he restarts the Lenvima.  He is still working.  He does have some fatigue but he really has been able to work pretty much full-time.  He has had no rashes.  He has had no fever.  He has had no bleeding.  There is been no obvious change in bowel or bladder habits.  He really does not have much edema in his legs.  I think he does take some Lasix which has helped.  Currently, I would say his performance status is ECOG 1.       Medications:  Current Outpatient Medications:  .  cloNIDine (CATAPRES) 0.1 MG tablet, Take 1 tablet (0.1 mg total) by mouth 2 (two) times daily., Disp: 60 tablet, Rfl: 11 .  cyclobenzaprine (FLEXERIL) 10 MG tablet, Take 10 mg by mouth 3 (three) times daily as needed for muscle spasms., Disp: , Rfl:  .  EPINEPHrine (EPIPEN 2-PAK) 0.3 mg/0.3 mL IJ SOAJ injection, USE AS DIRECTED FOR LIFE THREATENING ALLERGIC REACTIONS (Patient not taking: Reported on 03/02/2019), Disp: 4 Device, Rfl: 3 .  furosemide (LASIX) 80 MG tablet, Take 1 tablet (80 mg total) by mouth daily. (Patient taking differently: Take 80 mg by mouth as needed. ), Disp: 30 tablet, Rfl: 1 .  gabapentin (NEURONTIN) 300 MG capsule, daily as needed. , Disp: , Rfl:  .   isosorbide mononitrate (IMDUR) 60 MG 24 hr tablet, Take 1 tablet (60 mg total) by mouth daily., Disp: 90 tablet, Rfl: 3 .  LENVIMA, 24 MG DAILY DOSE, 2 x 10 MG & 4 MG capsule, Take 24 mg by mouth at bedtime. , Disp: , Rfl:  .  levothyroxine (SYNTHROID) 200 MCG tablet, Take 1 tablet (200 mcg total) by mouth daily before breakfast., Disp: 90 tablet, Rfl: 3 .  levothyroxine (SYNTHROID) 75 MCG tablet, Take 75 mcg by mouth daily., Disp: , Rfl:  .  lidocaine (XYLOCAINE) 2 % solution, Use as directed in the mouth or throat as needed. , Disp: , Rfl:  .  losartan (COZAAR) 25 MG tablet, Take 1 tablet (25 mg total) by mouth 2 (two) times daily., Disp: 60 tablet, Rfl: 5 .  metolazone (ZAROXOLYN) 5 MG tablet, Take 1 tablet (5 mg total) by mouth daily. Take 1 hour before Lasix. (Patient taking differently: Take 5 mg by mouth as needed. Take 1 hour before Lasix.), Disp: 30 tablet, Rfl: 1 .  ondansetron (ZOFRAN) 8 MG tablet, Take 8 mg by mouth every 8 (eight) hours as needed for nausea or vomiting. , Disp: , Rfl:  .  oxyCODONE (OXY IR/ROXICODONE) 5 MG immediate release tablet, Take 1 tablet (  5 mg total) by mouth every 6 (six) hours as needed for severe pain., Disp: 120 tablet, Rfl: 0 .  potassium chloride SA (KLOR-CON) 10 MEQ tablet, Take 1 tablet (10 mEq total) by mouth daily. (Patient taking differently: Take 10 mEq by mouth as needed. Prn with lasix), Disp: 30 tablet, Rfl: 1 .  promethazine (PHENERGAN) 12.5 MG tablet, Take 1 tablet (12.5 mg total) by mouth at bedtime., Disp: 60 tablet, Rfl: 3  Allergies:  Allergies  Allergen Reactions  . Dextromethorphan-Guaifenesin Other (See Comments)    Irregular heartbeat   . Doxycycline Anaphylaxis, Nausea And Vomiting, Rash and Other (See Comments)    "heart arrythmia" and "dyspepsia" (only oral doxycycline causes reaction)  . Gadobutrol Hives and Other (See Comments)    Patient had MRI scan at [place]. Patient called one hour after he left imaging facility  to report two "blisters" that came up on "back" of lip.    . Gadolinium Derivatives Hives and Other (See Comments)    Patient had MRI scan at [place]. Patient called one hour after he left imaging facility to report two "blisters" that came up on "back" of lip.   . Guaifenesin Palpitations       . Ibuprofen Hives and Itching  . Lisinopril Other (See Comments)    Angioedema  . Pantoprazole Itching  . Tramadol Hives and Itching    Past Medical History, Surgical history, Social history, and Family History were reviewed and updated.  Review of Systems: Review of Systems  Constitutional: Negative.   HENT:   Positive for lump/mass and sore throat.   Eyes: Negative.   Respiratory: Negative.   Cardiovascular: Negative.   Gastrointestinal: Negative.   Endocrine: Negative.   Genitourinary: Negative.    Musculoskeletal: Negative.   Skin: Negative.   Neurological: Negative.   Hematological: Negative.   Psychiatric/Behavioral: Negative.     Physical Exam:  vitals were not taken for this visit.   Wt Readings from Last 3 Encounters:  03/02/19 247 lb (112 kg)  02/14/19 250 lb (113.4 kg)  01/16/19 240 lb (108.9 kg)    Physical Exam Vitals reviewed.  HENT:     Head: Normocephalic and atraumatic.  Eyes:     Pupils: Pupils are equal, round, and reactive to light.  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  This is well-healing.  There is some slight fullness but no distinct mass noted in the neck. Cardiovascular:     Rate and Rhythm: Normal rate and regular rhythm.     Heart sounds: Normal heart sounds.  Pulmonary:     Effort: Pulmonary effort is normal.     Breath sounds: Normal breath sounds.  Abdominal:     General: Bowel sounds are normal.     Palpations: Abdomen is soft.  Musculoskeletal:        General: Swelling present. No tenderness or deformity. Normal range of motion.     Cervical back: Normal range  of motion.  Lymphadenopathy:     Cervical: No cervical adenopathy.  Skin:    General: Skin is warm and dry.     Findings: No erythema or rash.  Neurological:     Mental Status: He is alert and oriented to person, place, and time.  Psychiatric:        Behavior: Behavior normal.        Thought Content: Thought content normal.  Judgment: Judgment normal.      Lab Results  Component Value Date   WBC 4.5 03/23/2019   HGB 13.1 03/23/2019   HCT 38.4 (L) 03/23/2019   MCV 87.7 03/23/2019   PLT 189 03/23/2019     Chemistry      Component Value Date/Time   NA 138 03/02/2019 0750   NA 141 02/06/2019 1210   K 4.0 03/02/2019 0750   CL 104 03/02/2019 0750   CO2 30 03/02/2019 0750   BUN 21 (H) 03/02/2019 0750   BUN 8 02/06/2019 1210   CREATININE 0.92 03/02/2019 0750   CREATININE 0.90 01/16/2019 1308      Component Value Date/Time   CALCIUM 9.0 03/02/2019 0750   ALKPHOS 63 03/02/2019 0750   AST 15 03/02/2019 0750   AST 18 01/16/2019 1308   ALT 16 03/02/2019 0750   ALT 14 01/16/2019 1308   BILITOT 0.7 03/02/2019 0750   BILITOT 0.5 01/16/2019 1308      Impression and Plan: Brian [name] is a 44 year old white male.  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  He is being followed by the endocrine malignancy clinic at [facility].    Hopefully, he will do well with the 10 mg dose of Lenvima.  I told him that a lot of times, with targeted therapy, the actual effective dose is much less than the recommended dose.  I think he goes back to see his oncologist at [facility] every couple weeks or so.  She wants to make sure she follows his renal function closely.  I will plan to see him back here in about 3-4 weeks.    [name], MD 2/4/20218:26 AM

## 2019-03-30 DIAGNOSIS — R809 Proteinuria, unspecified: Secondary | ICD-10-CM | POA: Diagnosis not present

## 2019-03-30 DIAGNOSIS — C73 Malignant neoplasm of thyroid gland: Secondary | ICD-10-CM | POA: Diagnosis not present

## 2019-04-05 DIAGNOSIS — C799 Secondary malignant neoplasm of unspecified site: Secondary | ICD-10-CM | POA: Diagnosis not present

## 2019-04-05 DIAGNOSIS — C73 Malignant neoplasm of thyroid gland: Secondary | ICD-10-CM | POA: Diagnosis not present

## 2019-04-05 DIAGNOSIS — R809 Proteinuria, unspecified: Secondary | ICD-10-CM | POA: Diagnosis not present

## 2019-04-05 DIAGNOSIS — K769 Liver disease, unspecified: Secondary | ICD-10-CM | POA: Diagnosis not present

## 2019-04-05 DIAGNOSIS — R160 Hepatomegaly, not elsewhere classified: Secondary | ICD-10-CM | POA: Diagnosis not present

## 2019-04-05 DIAGNOSIS — K7689 Other specified diseases of liver: Secondary | ICD-10-CM | POA: Diagnosis not present

## 2019-04-05 DIAGNOSIS — C787 Secondary malignant neoplasm of liver and intrahepatic bile duct: Secondary | ICD-10-CM | POA: Diagnosis not present

## 2019-04-10 ENCOUNTER — Inpatient Hospital Stay: Payer: 59

## 2019-04-10 ENCOUNTER — Other Ambulatory Visit: Payer: Self-pay

## 2019-04-10 ENCOUNTER — Inpatient Hospital Stay (HOSPITAL_BASED_OUTPATIENT_CLINIC_OR_DEPARTMENT_OTHER): Payer: 59 | Admitting: Hematology & Oncology

## 2019-04-10 ENCOUNTER — Other Ambulatory Visit: Payer: Self-pay | Admitting: *Deleted

## 2019-04-10 ENCOUNTER — Telehealth: Payer: Self-pay | Admitting: Pharmacy Technician

## 2019-04-10 ENCOUNTER — Encounter: Payer: Self-pay | Admitting: Hematology & Oncology

## 2019-04-10 ENCOUNTER — Encounter: Payer: Self-pay | Admitting: *Deleted

## 2019-04-10 VITALS — BP 133/82 | HR 104 | Temp 97.5°F | Wt 252.0 lb

## 2019-04-10 DIAGNOSIS — I10 Essential (primary) hypertension: Secondary | ICD-10-CM

## 2019-04-10 DIAGNOSIS — C73 Malignant neoplasm of thyroid gland: Secondary | ICD-10-CM | POA: Diagnosis not present

## 2019-04-10 DIAGNOSIS — C787 Secondary malignant neoplasm of liver and intrahepatic bile duct: Secondary | ICD-10-CM | POA: Diagnosis not present

## 2019-04-10 DIAGNOSIS — R0609 Other forms of dyspnea: Secondary | ICD-10-CM

## 2019-04-10 DIAGNOSIS — R519 Headache, unspecified: Secondary | ICD-10-CM

## 2019-04-10 DIAGNOSIS — R0600 Dyspnea, unspecified: Secondary | ICD-10-CM

## 2019-04-10 LAB — URINALYSIS, COMPLETE (UACMP) WITH MICROSCOPIC
Bilirubin Urine: NEGATIVE
Glucose, UA: NEGATIVE mg/dL
Hgb urine dipstick: NEGATIVE
Ketones, ur: NEGATIVE mg/dL
Leukocytes,Ua: NEGATIVE
Nitrite: NEGATIVE
Protein, ur: 100 mg/dL — AB
Specific Gravity, Urine: >1.03 — ABNORMAL HIGH (ref 1.005–1.030)
pH: 6 (ref 5.0–8.0)

## 2019-04-10 LAB — CBC WITH DIFFERENTIAL (CANCER CENTER ONLY)
Abs Immature Granulocytes: 0.02 10*3/uL (ref 0.00–0.07)
Basophils Absolute: 0 10*3/uL (ref 0.0–0.1)
Basophils Relative: 0 %
Eosinophils Absolute: 0.1 10*3/uL (ref 0.0–0.5)
Eosinophils Relative: 1 %
HCT: 41 % (ref 39.0–52.0)
Hemoglobin: 13.9 g/dL (ref 13.0–17.0)
Immature Granulocytes: 0 %
Lymphocytes Relative: 25 %
Lymphs Abs: 1.2 10*3/uL (ref 0.7–4.0)
MCH: 29.8 pg (ref 26.0–34.0)
MCHC: 33.9 g/dL (ref 30.0–36.0)
MCV: 87.8 fL (ref 80.0–100.0)
Monocytes Absolute: 0.4 10*3/uL (ref 0.1–1.0)
Monocytes Relative: 8 %
Neutro Abs: 3.2 10*3/uL (ref 1.7–7.7)
Neutrophils Relative %: 66 %
Platelet Count: 246 10*3/uL (ref 150–400)
RBC: 4.67 MIL/uL (ref 4.22–5.81)
RDW: 12.5 % (ref 11.5–15.5)
WBC Count: 4.9 10*3/uL (ref 4.0–10.5)
nRBC: 0 % (ref 0.0–0.2)

## 2019-04-10 LAB — CMP (CANCER CENTER ONLY)
ALT: 15 U/L (ref 0–44)
AST: 16 U/L (ref 15–41)
Albumin: 4 g/dL (ref 3.5–5.0)
Alkaline Phosphatase: 82 U/L (ref 38–126)
Anion gap: 7 (ref 5–15)
BUN: 14 mg/dL (ref 6–20)
CO2: 30 mmol/L (ref 22–32)
Calcium: 9.7 mg/dL (ref 8.9–10.3)
Chloride: 103 mmol/L (ref 98–111)
Creatinine: 0.82 mg/dL (ref 0.61–1.24)
GFR, Est AFR Am: >60 mL/min (ref >60)
GFR, Estimated: >60 mL/min (ref >60)
Glucose, Bld: 102 mg/dL — ABNORMAL HIGH (ref 70–99)
Potassium: 4.5 mmol/L (ref 3.5–5.1)
Sodium: 140 mmol/L (ref 135–145)
Total Bilirubin: 0.5 mg/dL (ref 0.3–1.2)
Total Protein: 6.4 g/dL — ABNORMAL LOW (ref 6.5–8.1)

## 2019-04-10 MED ORDER — PROMETHAZINE HCL 12.5 MG PO TABS: 12.5000 mg | ORAL_TABLET | Freq: Every day | ORAL | 3 refills | Status: DC

## 2019-04-10 MED ORDER — OXYCODONE HCL 5 MG PO TABS: 5.0000 mg | ORAL_TABLET | Freq: Four times a day (QID) | ORAL | 0 refills | Status: DC | PRN

## 2019-04-10 MED ORDER — CABOZANTINIB S-MALATE 40 MG PO TABS: 40.0000 mg | ORAL_TABLET | Freq: Every day | ORAL | 5 refills | Status: DC

## 2019-04-10 NOTE — Progress Notes (Signed)
Hematology and Oncology Follow Up Visit  Brian [name] [id number] March 24, 1975 44 y.o. 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  Unfortunately, it looks like his disease is now progressing.  He had scans done at [facility].  He had a MRI done of the abdomen.  He had a PET scan done.  It looks like he has disease progression in the liver.  Also looks like he has a new area in the bed of the right thyroid gland that is also active now.  I think that regard to have to do something with respect to treatment on him.  I do still think that his kidneys are going to tolerate the Lenvima.  He did speak to his oncologist at [facility].  She recommended Cabometyx.  This certainly would be reasonable.  I am going to see if we cannot somehow do some type of intrahepatic therapy on him.  It looks like his disease is mostly in his liver.  I was wondering if there might be some kind of intrahepatic therapy that we might be able to consider.  Spoke to one of the interventional radiologist about this.  He will take a look and see if there is a possibility of some form of localized intrahepatic therapy.  He has had no fever.  He has had no cough.  His leg swelling is much better.  His albumin is come back up quite nicely.  He has had no bleeding.  There is been no rashes.  Overall, I would say his performance status is ECOG 0-1.        Medications:  Current Outpatient Medications:  .  cloNIDine (CATAPRES) 0.1 MG tablet, Take 1 tablet (0.1 mg total) by mouth 2 (two) times daily., Disp: 60 tablet, Rfl: 11 .  cyclobenzaprine (FLEXERIL) 10 MG tablet, Take 10 mg by mouth 3 (three) times daily as needed for muscle spasms., Disp: , Rfl:  .   EPINEPHrine (EPIPEN 2-PAK) 0.3 mg/0.3 mL IJ SOAJ injection, USE AS DIRECTED FOR LIFE THREATENING ALLERGIC REACTIONS, Disp: 4 Device, Rfl: 3 .  furosemide (LASIX) 80 MG tablet, Take 1 tablet (80 mg total) by mouth daily. (Patient taking differently: Take 80 mg by mouth as needed. ), Disp: 30 tablet, Rfl: 1 .  gabapentin (NEURONTIN) 300 MG capsule, daily as needed. , Disp: , Rfl:  .  isosorbide mononitrate (IMDUR) 60 MG 24 hr tablet, Take 1 tablet (60 mg total) by mouth daily., Disp: 90 tablet, Rfl: 3 .  levothyroxine (SYNTHROID) 200 MCG tablet, Take 1 tablet (200 mcg total) by mouth daily before breakfast., Disp: 90 tablet, Rfl: 3 .  levothyroxine (SYNTHROID) 75 MCG tablet, Take 75 mcg by mouth daily., Disp: , Rfl:  .  lidocaine (XYLOCAINE) 2 % solution, Use as directed in the mouth or throat as needed. , Disp: , Rfl:  .  losartan (COZAAR) 25 MG tablet, Take 1 tablet (25 mg total) by mouth 2 (two) times daily., Disp: 60 tablet, Rfl: 5 .  metolazone (ZAROXOLYN) 5 MG tablet, Take 1 tablet (5 mg total) by mouth daily. Take 1 hour before Lasix., Disp: 30 tablet, Rfl: 1 .  ondansetron (ZOFRAN) 8 MG  tablet, Take 8 mg by mouth every 8 (eight) hours as needed for nausea or vomiting. , Disp: , Rfl:  .  oxyCODONE (OXY IR/ROXICODONE) 5 MG immediate release tablet, Take 1 tablet (5 mg total) by mouth every 6 (six) hours as needed for severe pain., Disp: 120 tablet, Rfl: 0 .  potassium chloride SA (KLOR-CON) 10 MEQ tablet, Take 1 tablet (10 mEq total) by mouth daily., Disp: 30 tablet, Rfl: 1 .  promethazine (PHENERGAN) 12.5 MG tablet, Take 1 tablet (12.5 mg total) by mouth at bedtime., Disp: 60 tablet, Rfl: 3  Allergies:  Allergies  Allergen Reactions  . Dextromethorphan-Guaifenesin Other (See Comments)    Irregular heartbeat   . Doxycycline Anaphylaxis, Nausea And Vomiting, Rash and Other (See Comments)    "heart arrythmia" and "dyspepsia" (only oral doxycycline causes reaction)  . Gadobutrol Hives and  Other (See Comments)    Patient had MRI scan at [place]. Patient called one hour after he left imaging facility to report two "blisters" that came up on "back" of lip.    . Gadolinium Derivatives Hives and Other (See Comments)    Patient had MRI scan at [place]. Patient called one hour after he left imaging facility to report two "blisters" that came up on "back" of lip.   . Guaifenesin Palpitations       . Ibuprofen Hives and Itching  . Lisinopril Other (See Comments)    Angioedema  . Pantoprazole Itching  . Tramadol Hives and Itching    Past Medical History, Surgical history, Social history, and Family History were reviewed and updated.  Review of Systems: Review of Systems  Constitutional: Negative.   HENT:   Positive for lump/mass and sore throat.   Eyes: Negative.   Respiratory: Negative.   Cardiovascular: Negative.   Gastrointestinal: Negative.   Endocrine: Negative.   Genitourinary: Negative.    Musculoskeletal: Negative.   Skin: Negative.   Neurological: Negative.   Hematological: Negative.   Psychiatric/Behavioral: Negative.     Physical Exam:  weight is 252 lb (114.3 kg). His oral temperature is 97.5 F (36.4 C) (abnormal). His blood pressure is 133/82 and his pulse is 104 (abnormal). His oxygen saturation is 99%.   Wt Readings from Last 3 Encounters:  04/10/19 252 lb (114.3 kg)  03/23/19 250 lb 12.8 oz (113.8 kg)  03/02/19 247 lb (112 kg)    Physical Exam Vitals reviewed.  HENT:     Head: Normocephalic and atraumatic.  Eyes:     Pupils: Pupils are equal, round, and reactive to light.  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  This is well-healing.  There is some slight fullness but no distinct mass noted in the neck. Cardiovascular:     Rate and Rhythm: Normal rate and regular rhythm.     Heart sounds: Normal heart sounds.  Pulmonary:     Effort: Pulmonary effort is  normal.     Breath sounds: Normal breath sounds.  Abdominal:     General: Bowel sounds are normal.     Palpations: Abdomen is soft.  Musculoskeletal:        General: Swelling present. No tenderness or deformity. Normal range of motion.     Cervical back: Normal range of motion.  Lymphadenopathy:     Cervical: No cervical adenopathy.  Skin:    General: Skin is warm and dry.     Findings: No erythema or rash.  Neurological:     Mental Status: He is alert and oriented to person, place, and time.  Psychiatric:        Behavior: Behavior normal.        Thought Content: Thought content normal.        Judgment: Judgment normal.      Lab Results  Component Value Date   WBC 4.9 04/10/2019   HGB 13.9 04/10/2019   HCT 41.0 04/10/2019   MCV 87.8 04/10/2019   PLT 246 04/10/2019     Chemistry      Component Value Date/Time   NA 140 04/10/2019 1154   NA 141 02/06/2019 1210   K 4.5 04/10/2019 1154   CL 103 04/10/2019 1154   CO2 30 04/10/2019 1154   BUN 14 04/10/2019 1154   BUN 8 02/06/2019 1210   CREATININE 0.82 04/10/2019 1154      Component Value Date/Time   CALCIUM 9.7 04/10/2019 1154   ALKPHOS 82 04/10/2019 1154   AST 16 04/10/2019 1154   ALT 15 04/10/2019 1154   BILITOT 0.5 04/10/2019 1154      Impression and Plan: Brian [name] is a 44 year old white male.  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  He is being followed by the endocrine malignancy clinic at [facility].    Again, we are dealing with progressive disease.  This really makes it difficult now given that we have limitations with respect to his therapy options.  We will see if he does okay on the Cabometyx.  I still feel that trying some kind of intrahepatic therapy would be reasonable.  The problem is that there is probably very little information as to how effective intrahepatic therapy would be for thyroid cancer.  I know this is quite complicated.  [name] is still quite young and quite  healthy.  This is an unusual tumor.  It would be very nice if we could try to somehow be aggressive with this and try something that might be a little bit "novel."  I will try to speak to his oncologist at [facility] this week.  I would like to see him back in about 3-4 weeks.    Overall, we spent about 45 minutes with him today.  [name], MD 2/22/20211:06 PM

## 2019-04-10 NOTE — Progress Notes (Signed)
Order received from Dr. Marin Olp for pt to have [place] 360 done.  [place] 360 complete.

## 2019-04-10 NOTE — Telephone Encounter (Signed)
Oral Oncology Patient Advocate Encounter  Received notification from [employer] that prior authorization for Cabometyx is required.  PA submitted on CoverMyMeds Key [id number] Status is pending  Oral Oncology Clinic will continue to follow.  Brian [name] Phone [phone] Fax [phone] 04/11/2019 10:43 AM

## 2019-04-11 ENCOUNTER — Ambulatory Visit: Payer: 59 | Admitting: Cardiology

## 2019-04-11 ENCOUNTER — Telehealth: Payer: Self-pay | Admitting: Pharmacist

## 2019-04-11 ENCOUNTER — Other Ambulatory Visit: Payer: Self-pay | Admitting: *Deleted

## 2019-04-11 NOTE — Telephone Encounter (Signed)
Oral Oncology Pharmacist Encounter  Received new prescription for Cabometyx (cabozantinib) for the treatment of metastatic Hurthle cell carcinoma, planned duration until disease progression or unacceptable toxicity.  Prescription dose and frequency assessed. Plan for patient to initiation Cabometyx 40 mg by mouth once daily on an empty stomach, 1 hour before or 2 hours after a meal per MD and recommendations from from [facility].   CBC, CMP and Urinalysis from 04/10/19 assessed:  Proteinuria evident on urinalysis (100 mg/dL) - will continue to monitor throughout therapy   Current medication list in Epic reviewed, DDIs with Cabometyx identified:  Category C drug-drug interaction between Cabometyx and furosemide - furosemide may increase serum concentrations of Cabometyx. Recommend monitoring for increased side effects with Cabometyx. No dose adjustments necessary at this time.   Prescription has been e-scribed to the [facility] for benefits analysis and approval.  Oral Oncology Clinic will continue to follow for insurance authorization, copayment issues, initial counseling and start date.  Leron Croak, PharmD, [place] PGY2 Hematology/Oncology Pharmacy Resident 04/11/2019 11:01 AM Oral Oncology Clinic [phone]

## 2019-04-12 NOTE — Telephone Encounter (Signed)
Oral Oncology Patient Advocate Encounter  Prior Authorization for Cabometyx has been approved.    PA# [id number] Effective dates: 04/11/2019 through 04/09/2020  Patients co-pay is $1152.86.  Copay card available.  Oral Oncology Clinic will continue to follow.   [place] Patient [place] Phone [phone] Fax [phone] 04/12/2019 9:45 AM

## 2019-04-13 ENCOUNTER — Telehealth: Payer: Self-pay | Admitting: *Deleted

## 2019-04-13 ENCOUNTER — Other Ambulatory Visit: Payer: Self-pay

## 2019-04-13 ENCOUNTER — Ambulatory Visit: Payer: 59 | Admitting: Cardiology

## 2019-04-13 ENCOUNTER — Other Ambulatory Visit: Payer: Self-pay | Admitting: *Deleted

## 2019-04-13 ENCOUNTER — Encounter: Payer: Self-pay | Admitting: Cardiology

## 2019-04-13 VITALS — BP 118/80 | HR 80 | Ht 70.0 in | Wt 253.0 lb

## 2019-04-13 DIAGNOSIS — R0600 Dyspnea, unspecified: Secondary | ICD-10-CM | POA: Diagnosis not present

## 2019-04-13 DIAGNOSIS — C73 Malignant neoplasm of thyroid gland: Secondary | ICD-10-CM | POA: Diagnosis not present

## 2019-04-13 DIAGNOSIS — I4949 Other premature depolarization: Secondary | ICD-10-CM

## 2019-04-13 DIAGNOSIS — I10 Essential (primary) hypertension: Secondary | ICD-10-CM

## 2019-04-13 DIAGNOSIS — R0609 Other forms of dyspnea: Secondary | ICD-10-CM

## 2019-04-13 MED ORDER — PROMETHAZINE HCL 25 MG PO TABS: 25.0000 mg | ORAL_TABLET | Freq: Four times a day (QID) | ORAL | 0 refills | Status: DC | PRN

## 2019-04-13 MED ORDER — METOCLOPRAMIDE HCL 10 MG PO TABS: 10.0000 mg | ORAL_TABLET | Freq: Three times a day (TID) | ORAL | 0 refills | Status: DC

## 2019-04-13 NOTE — Telephone Encounter (Signed)
Oral Chemotherapy Pharmacist Encounter  Patient plans on picking up his medication from [place] on 2/26 or 2/27. He knows the plan is for him for get started on 04/17/19.  Patient Education I spoke with patient for overview of new oral chemotherapy medication: Cabometyx (cabozantinib) for the treatment of metastatic Hurthle cell carcinoma, planned duration until disease progression or unacceptable toxicity.   Counseled patient on administration, dosing, side effects, monitoring, drug-food interactions, safe handling, storage, and disposal. Patient will take 1 tablet (40 mg total) by mouth daily. Take on an empty stomach, 1 hour before or 2 hours after meals.  Side effects include but not limited to: hand-foot syndrome, fatigue, N/V, diarrhea.    Reviewed with patient importance of keeping a medication schedule and plan for any missed doses.  [name] voiced understanding and appreciation. All questions answered. Medication handout placed in the mail.  Provided patient with [facility] [facility] phone number. Patient knows to call the office with questions or concerns. Oral Chemotherapy Navigation Clinic will continue to follow.  Darl Pikes, PharmD, BCPS, [facility] Hematology/Oncology Clinical Pharmacist [facility] [phone]  04/13/2019 3:01 PM

## 2019-04-13 NOTE — Telephone Encounter (Signed)
Call received from patient requesting increase in Phenergan amount d/t increased need for nausea medicine.  Pt states that Zofran and Phenergan are effective for nausea.  Dr. Marin Olp notified and new prescriptions sent.

## 2019-04-13 NOTE — Progress Notes (Signed)
Cardiology Office Note:    Date:  04/13/2019   ID:  Brian [name], DOB [birth year], MRN [id number]  PCP:  Brian Richters, MD  Cardiologist:  Brian Campus, MD    Referring MD: Brian Richters, MD   No chief complaint on file. Doing well  History of Present Illness:    Brian [name] is a 44 y.o. 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  Brian [name] is coming today to my office for follow-up.  Overall cardiac wise doing well and he looks good actually.  In the winter he did have a COVID-19 infection received monoclonal antibody did quite well and recovering from it.  Sadly his metastatic thyroid cancer seems to be progressing.  He is going to initiate new chemotherapy and also some potential more aggressive treatment towards liver metastasis I will be contemplated.  He is seems to be in good spirits and he said he feels good.  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  Cardiac wise no symptoms  Past Medical History:  Diagnosis Date  . Essential hypertension 09/09/2018  . Kidney stone   . Primary cancer of thyroid with metastasis to other site ([place]) 05/04/2018  . Rickettsia infection     Past Surgical History:  Procedure Laterality Date  . BIOPSY  04/05/2018   Procedure: BIOPSY;  Surgeon: Ronald Lobo, MD;  Location: [facility];  Service: Endoscopy;;  . CHOLECYSTECTOMY    . ESOPHAGOGASTRODUODENOSCOPY (EGD) WITH PROPOFOL N/A 04/05/2018   Procedure: ESOPHAGOGASTRODUODENOSCOPY (EGD) WITH PROPOFOL;  Surgeon: Ronald Lobo, MD;  Location: [facility];  Service: Endoscopy;  Laterality: N/A;  . IR RADIOLOGIST EVAL & MGMT  10/11/2018    Current Medications: Current Meds  Medication Sig  . cabozantinib (CABOMETYX) 40 MG tablet Take 1 tablet (40 mg total) by mouth daily. Take on an empty stomach, 1 hour before or 2 hours after meals.  . cloNIDine  (CATAPRES) 0.1 MG tablet Take 1 tablet (0.1 mg total) by mouth 2 (two) times daily.  . cyclobenzaprine (FLEXERIL) 10 MG tablet Take 10 mg by mouth 3 (three) times daily as needed for muscle spasms.  Marland Kitchen EPINEPHrine (EPIPEN 2-PAK) 0.3 mg/0.3 mL IJ SOAJ injection USE AS DIRECTED FOR LIFE THREATENING ALLERGIC REACTIONS  . furosemide (LASIX) 80 MG tablet Take 1 tablet (80 mg total) by mouth daily.  Marland Kitchen gabapentin (NEURONTIN) 300 MG capsule daily as needed.   . isosorbide mononitrate (IMDUR) 60 MG 24 hr tablet Take 1 tablet (60 mg total) by mouth daily.  Marland Kitchen levothyroxine (SYNTHROID) 200 MCG tablet Take 1 tablet (200 mcg total) by mouth daily before breakfast.  . levothyroxine (SYNTHROID) 75 MCG tablet Take 75 mcg by mouth daily.  Marland Kitchen lidocaine (XYLOCAINE) 2 % solution Use as directed in the mouth or throat as needed.   Marland Kitchen losartan (COZAAR) 25 MG tablet Take 1 tablet (25 mg total) by mouth 2 (two) times daily.  . metolazone (ZAROXOLYN) 5 MG tablet Take 1 tablet (5 mg total) by mouth daily. Take 1 hour before Lasix.  Marland Kitchen ondansetron (ZOFRAN) 8 MG tablet Take 8 mg by mouth every 8 (eight) hours as needed for nausea or vomiting.   Marland Kitchen oxyCODONE (OXY IR/ROXICODONE) 5 MG immediate release tablet Take 1 tablet (5 mg total) by mouth every 6 (six) hours as needed for severe pain.  . potassium chloride SA (KLOR-CON) 10 MEQ tablet Take 1 tablet (  10 mEq total) by mouth daily.  . promethazine (PHENERGAN) 12.5 MG tablet Take 1 tablet (12.5 mg total) by mouth at bedtime.     Allergies:   Dextromethorphan-guaifenesin, Doxycycline, Gadobutrol, Gadolinium derivatives, Guaifenesin, Ibuprofen, Lisinopril, Pantoprazole, and Tramadol   Social History   Socioeconomic History  . Marital status: Married    Spouse name: Not on file  . Number of children: Not on file  . Years of education: Not on file  . Highest education level: Not on file  Occupational History  . Not on file  Tobacco Use  . Smoking status: Never Smoker  .  Smokeless tobacco: Never Used  Substance and Sexual Activity  . Alcohol use: No  . Drug use: No  . Sexual activity: Not on file  Other Topics Concern  . Not on file  Social History Narrative  . Not on file   Social Determinants of Health   Financial Resource Strain:   . Difficulty of Paying Living Expenses: Not on file  Food Insecurity:   . Worried About Charity fundraiser in the Last Year: Not on file  . Ran Out of Food in the Last Year: Not on file  Transportation Needs:   . Lack of Transportation (Medical): Not on file  . Lack of Transportation (Non-Medical): Not on file  Physical Activity:   . Days of Exercise per Week: Not on file  . Minutes of Exercise per Session: Not on file  Stress:   . Feeling of Stress : Not on file  Social Connections:   . Frequency of Communication with Friends and Family: Not on file  . Frequency of Social Gatherings with Friends and Family: Not on file  . Attends Religious Services: Not on file  . Active Member of Clubs or Organizations: Not on file  . Attends Archivist Meetings: Not on file  . Marital Status: Not on file     Family History: The patient's family history includes Allergic rhinitis in his father; Diabetes in his mother; Heart attack in his mother; Heart disease in his mother; Heart failure in his mother; Hyperlipidemia in his father and mother; Hypertension in his mother; Rashes / Skin problems in his father. There is no history of Sudden death or Thyroid disease. ROS:   Please see the history of present illness.    All 14 point review of systems negative except as described per history of present illness  EKGs/Labs/Other Studies Reviewed:      Recent Labs: 09/12/2018: Magnesium 2.1 11/02/2018: B Natriuretic Peptide 117.9 03/02/2019: TSH <0.080 04/10/2019: ALT 15; BUN 14; Creatinine 0.82; Hemoglobin 13.9; Platelet Count 246; Potassium 4.5; Sodium 140  Recent Lipid Panel    Component Value Date/Time   CHOL   10/08/2008 0655    119        ATP III CLASSIFICATION:  <200     mg/dL   Desirable  200-239  mg/dL   Borderline High  >=240    mg/dL   High          TRIG 84 10/08/2008 0655   HDL 20 (L) 10/08/2008 0655   CHOLHDL 6.0 10/08/2008 0655   VLDL 17 10/08/2008 0655   LDLCALC  10/08/2008 0655    82        Total Cholesterol/HDL:CHD Risk Coronary Heart Disease Risk Table                     Men   Women  1/2 Average Risk  3.4   3.3  Average Risk       5.0   4.4  2 X Average Risk   9.6   7.1  3 X Average Risk  23.4   11.0        Use the calculated Patient Ratio above and the CHD Risk Table to determine the patient's CHD Risk.        ATP III CLASSIFICATION (LDL):  <100     mg/dL   Optimal  100-129  mg/dL   Near or Above                    Optimal  130-159  mg/dL   Borderline  160-189  mg/dL   High  >190     mg/dL   Very High    Physical Exam:    VS:  BP 118/80   Pulse 80   Ht 5\' 10"  (1.778 m)   Wt 253 lb (114.8 kg)   SpO2 99%   BMI 36.30 kg/m     Wt Readings from Last 3 Encounters:  04/13/19 253 lb (114.8 kg)  04/10/19 252 lb (114.3 kg)  03/23/19 250 lb 12.8 oz (113.8 kg)     GEN:  Well nourished, well developed in no acute distress HEENT: Normal NECK: No JVD; No carotid bruits LYMPHATICS: No lymphadenopathy CARDIAC: RRR, no murmurs, no rubs, no gallops RESPIRATORY:  Clear to auscultation without rales, wheezing or rhonchi  ABDOMEN: Soft, non-tender, non-distended MUSCULOSKELETAL:  No edema; No deformity  SKIN: Warm and dry LOWER EXTREMITIES: no swelling NEUROLOGIC:  Alert and oriented x 3 PSYCHIATRIC:  Normal affect   ASSESSMENT:    1. Extrasystole   2. Essential hypertension   3. Primary cancer of thyroid with metastasis to other site ([place])   4. Dyspnea on exertion    PLAN:    In order of problems listed above:  1. Extrasystole denies having any we will continue present management. 2. Essential hypertension blood pressure well controlled continue present  management. 3. Primary cancer of thyroid with metastasis to liver.  Follow-up excellently by our oncology team. 4. Dyspnea on exertion: Denies having any.   Medication Adjustments/Labs and Tests Ordered: Current medicines are reviewed at length with the patient today.  Concerns regarding medicines are outlined above.  No orders of the defined types were placed in this encounter.  Medication changes: No orders of the defined types were placed in this encounter.   Signed, Park Liter, MD, [facility] 04/13/2019 9:30 AM    [place]

## 2019-04-13 NOTE — Patient Instructions (Signed)

## 2019-04-19 ENCOUNTER — Telehealth: Payer: Self-pay

## 2019-04-19 DIAGNOSIS — C73 Malignant neoplasm of thyroid gland: Secondary | ICD-10-CM | POA: Diagnosis not present

## 2019-04-19 NOTE — Telephone Encounter (Signed)
Guardant 360 results received via fax. Copy to Dr Marin Olp. Copy to scanned into pt chart. dph

## 2019-04-24 ENCOUNTER — Other Ambulatory Visit: Payer: Self-pay | Admitting: *Deleted

## 2019-04-24 DIAGNOSIS — C73 Malignant neoplasm of thyroid gland: Secondary | ICD-10-CM

## 2019-04-25 ENCOUNTER — Encounter: Payer: Self-pay | Admitting: *Deleted

## 2019-04-25 ENCOUNTER — Inpatient Hospital Stay: Payer: 59 | Attending: Hematology & Oncology

## 2019-04-25 DIAGNOSIS — C787 Secondary malignant neoplasm of liver and intrahepatic bile duct: Secondary | ICD-10-CM | POA: Insufficient documentation

## 2019-04-25 DIAGNOSIS — C73 Malignant neoplasm of thyroid gland: Secondary | ICD-10-CM | POA: Insufficient documentation

## 2019-04-25 DIAGNOSIS — Z79899 Other long term (current) drug therapy: Secondary | ICD-10-CM | POA: Diagnosis not present

## 2019-04-25 LAB — TOTAL PROTEIN, URINE DIPSTICK: Protein, ur: 100 mg/dL

## 2019-04-26 ENCOUNTER — Encounter: Payer: Self-pay | Admitting: *Deleted

## 2019-04-26 NOTE — Progress Notes (Signed)
This nurse called and spoke with patient regarding refill for Isosorbide Mononitrate 10 mg tablets. Per Dr. Marin Olp, he wants Dr. Agustin Cree, cardiologist, to manage blood pressure medications. He verbalized understanding.

## 2019-05-02 ENCOUNTER — Other Ambulatory Visit: Payer: 59

## 2019-05-02 ENCOUNTER — Ambulatory Visit: Payer: 59 | Admitting: Hematology & Oncology

## 2019-05-03 DIAGNOSIS — R809 Proteinuria, unspecified: Secondary | ICD-10-CM | POA: Diagnosis not present

## 2019-05-03 DIAGNOSIS — C73 Malignant neoplasm of thyroid gland: Secondary | ICD-10-CM | POA: Diagnosis not present

## 2019-05-03 DIAGNOSIS — Z5111 Encounter for antineoplastic chemotherapy: Secondary | ICD-10-CM | POA: Diagnosis not present

## 2019-05-03 DIAGNOSIS — C799 Secondary malignant neoplasm of unspecified site: Secondary | ICD-10-CM | POA: Diagnosis not present

## 2019-05-08 ENCOUNTER — Encounter: Payer: Self-pay | Admitting: Hematology & Oncology

## 2019-05-08 ENCOUNTER — Other Ambulatory Visit: Payer: Self-pay

## 2019-05-08 ENCOUNTER — Inpatient Hospital Stay (HOSPITAL_BASED_OUTPATIENT_CLINIC_OR_DEPARTMENT_OTHER): Payer: 59 | Admitting: Hematology & Oncology

## 2019-05-08 ENCOUNTER — Inpatient Hospital Stay: Payer: 59

## 2019-05-08 VITALS — BP 131/96 | HR 80 | Temp 97.5°F | Resp 18 | Wt 258.0 lb

## 2019-05-08 DIAGNOSIS — Z79899 Other long term (current) drug therapy: Secondary | ICD-10-CM | POA: Diagnosis not present

## 2019-05-08 DIAGNOSIS — C73 Malignant neoplasm of thyroid gland: Secondary | ICD-10-CM | POA: Diagnosis not present

## 2019-05-08 DIAGNOSIS — C787 Secondary malignant neoplasm of liver and intrahepatic bile duct: Secondary | ICD-10-CM | POA: Diagnosis not present

## 2019-05-08 DIAGNOSIS — R809 Proteinuria, unspecified: Secondary | ICD-10-CM | POA: Insufficient documentation

## 2019-05-08 LAB — CBC WITH DIFFERENTIAL (CANCER CENTER ONLY)
Abs Immature Granulocytes: 0.01 10*3/uL (ref 0.00–0.07)
Basophils Absolute: 0 10*3/uL (ref 0.0–0.1)
Basophils Relative: 0 %
Eosinophils Absolute: 0.1 10*3/uL (ref 0.0–0.5)
Eosinophils Relative: 2 %
HCT: 42.3 % (ref 39.0–52.0)
Hemoglobin: 14.6 g/dL (ref 13.0–17.0)
Immature Granulocytes: 0 %
Lymphocytes Relative: 32 %
Lymphs Abs: 1.2 10*3/uL (ref 0.7–4.0)
MCH: 29.3 pg (ref 26.0–34.0)
MCHC: 34.5 g/dL (ref 30.0–36.0)
MCV: 84.9 fL (ref 80.0–100.0)
Monocytes Absolute: 0.3 10*3/uL (ref 0.1–1.0)
Monocytes Relative: 7 %
Neutro Abs: 2.2 10*3/uL (ref 1.7–7.7)
Neutrophils Relative %: 59 %
Platelet Count: 169 10*3/uL (ref 150–400)
RBC: 4.98 MIL/uL (ref 4.22–5.81)
RDW: 13.2 % (ref 11.5–15.5)
WBC Count: 3.8 10*3/uL — ABNORMAL LOW (ref 4.0–10.5)
nRBC: 0 % (ref 0.0–0.2)

## 2019-05-08 LAB — CMP (CANCER CENTER ONLY)
ALT: 37 U/L (ref 0–44)
AST: 32 U/L (ref 15–41)
Albumin: 3.8 g/dL (ref 3.5–5.0)
Alkaline Phosphatase: 97 U/L (ref 38–126)
Anion gap: 4 — ABNORMAL LOW (ref 5–15)
BUN: 15 mg/dL (ref 6–20)
CO2: 31 mmol/L (ref 22–32)
Calcium: 9.3 mg/dL (ref 8.9–10.3)
Chloride: 103 mmol/L (ref 98–111)
Creatinine: 0.9 mg/dL (ref 0.61–1.24)
GFR, Est AFR Am: >60 mL/min (ref >60)
GFR, Estimated: >60 mL/min (ref >60)
Glucose, Bld: 95 mg/dL (ref 70–99)
Potassium: 4.2 mmol/L (ref 3.5–5.1)
Sodium: 138 mmol/L (ref 135–145)
Total Bilirubin: 0.6 mg/dL (ref 0.3–1.2)
Total Protein: 6 g/dL — ABNORMAL LOW (ref 6.5–8.1)

## 2019-05-08 LAB — URINALYSIS, COMPLETE (UACMP) WITH MICROSCOPIC
Bilirubin Urine: NEGATIVE
Glucose, UA: NEGATIVE mg/dL
Ketones, ur: NEGATIVE mg/dL
Leukocytes,Ua: NEGATIVE
Nitrite: NEGATIVE
Protein, ur: 100 mg/dL — AB
Specific Gravity, Urine: >1.03 — ABNORMAL HIGH (ref 1.005–1.030)
pH: 5.5 (ref 5.0–8.0)

## 2019-05-08 NOTE — Progress Notes (Signed)
Hematology and Oncology Follow Up Visit  Brian [name] [id number] 1975-11-19 44 y.o. 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  He has been on Cabometyx now from probably for about 3 weeks.  He is doing okay on Cabometyx.  His urine protein still 100.  He does not look like he is all that swollen and retaining a lot of fluid.  His blood pressure is on the high side.  He did not take his blood pressure medicine today.  He is think about going down to a thyroid cancer specialist down in [place].  I am not sure if this deals with medical thyroid cancer.  I think this probably is more surgical in nature.  His problem is that he has a systemic disease with the liver mets.  I do not think that this specialist would deal with that kind of problem.  However, if he wants to go down, we certainly can get all the records ready form.  He is eating okay.  He has had no problems with nausea or vomiting.  He has had no change in bowel or bladder habits.  There is been no rash.  He has had no headache.  There is no cough or shortness of breath.  There is been no bleeding.  Overall, I would say his performance status is ECOG 0.        Medications:  Current Outpatient Medications:  .  cabozantinib (CABOMETYX) 40 MG tablet, Take 1 tablet (40 mg total) by mouth daily. Take on an empty stomach, 1 hour before or 2 hours after meals., Disp: 30 tablet, Rfl: 5 .  cloNIDine (CATAPRES) 0.1 MG tablet, Take 1 tablet (0.1 mg total) by mouth 2 (two) times daily., Disp: 60 tablet, Rfl: 11 .  cyclobenzaprine (FLEXERIL) 10 MG tablet, Take 10 mg by mouth 3 (three) times daily as needed for muscle spasms., Disp: , Rfl:  .  EPINEPHrine (EPIPEN  2-PAK) 0.3 mg/0.3 mL IJ SOAJ injection, USE AS DIRECTED FOR LIFE THREATENING ALLERGIC REACTIONS, Disp: 4 Device, Rfl: 3 .  furosemide (LASIX) 80 MG tablet, Take 1 tablet (80 mg total) by mouth daily., Disp: 30 tablet, Rfl: 1 .  gabapentin (NEURONTIN) 300 MG capsule, daily as needed. , Disp: , Rfl:  .  isosorbide mononitrate (IMDUR) 60 MG 24 hr tablet, Take 1 tablet (60 mg total) by mouth daily., Disp: 90 tablet, Rfl: 3 .  levothyroxine (SYNTHROID) 200 MCG tablet, Take 1 tablet (200 mcg total) by mouth daily before breakfast., Disp: 90 tablet, Rfl: 3 .  levothyroxine (SYNTHROID) 75 MCG tablet, Take 75 mcg by mouth daily., Disp: , Rfl:  .  lidocaine (XYLOCAINE) 2 % solution, Use as directed in the mouth or throat as needed. , Disp: , Rfl:  .  losartan (COZAAR) 25 MG tablet, Take 1 tablet (25 mg total) by mouth 2 (two) times daily., Disp: 60 tablet, Rfl: 5 .  metoCLOPramide (REGLAN) 10 MG tablet, Take 1 tablet (10 mg total) by mouth 4 (four) times daily -  before meals and at bedtime., Disp: 120 tablet, Rfl: 0 .  metolazone (ZAROXOLYN) 5 MG tablet, Take 1 tablet (5 mg total)  by mouth daily. Take 1 hour before Lasix., Disp: 30 tablet, Rfl: 1 .  ondansetron (ZOFRAN) 8 MG tablet, Take 8 mg by mouth every 8 (eight) hours as needed for nausea or vomiting. , Disp: , Rfl:  .  oxyCODONE (OXY IR/ROXICODONE) 5 MG immediate release tablet, Take 1 tablet (5 mg total) by mouth every 6 (six) hours as needed for severe pain., Disp: 120 tablet, Rfl: 0 .  potassium chloride SA (KLOR-CON) 10 MEQ tablet, Take 1 tablet (10 mEq total) by mouth daily., Disp: 30 tablet, Rfl: 1 .  promethazine (PHENERGAN) 12.5 MG tablet, Take 12.5 mg by mouth at bedtime., Disp: , Rfl:  .  promethazine (PHENERGAN) 25 MG tablet, Take 1 tablet (25 mg total) by mouth every 6 (six) hours as needed for nausea or vomiting., Disp: 120 tablet, Rfl: 0  Allergies:  Allergies  Allergen Reactions  . Dextromethorphan-Guaifenesin Other (See Comments)     Irregular heartbeat   . Doxycycline Anaphylaxis, Nausea And Vomiting, Rash and Other (See Comments)    "heart arrythmia" and "dyspepsia" (only oral doxycycline causes reaction)  . Gadobutrol Hives and Other (See Comments)    Patient had MRI scan at [facility]. Patient called one hour after he left imaging facility to report two "blisters" that came up on "back" of lip.    . Gadolinium Derivatives Hives and Other (See Comments)    Patient had MRI scan at [facility]. Patient called one hour after he left imaging facility to report two "blisters" that came up on "back" of lip.   . Guaifenesin Palpitations       . Ibuprofen Hives and Itching  . Lisinopril Other (See Comments)    Angioedema  . Pantoprazole Itching  . Tramadol Hives and Itching    Past Medical History, Surgical history, Social history, and Family History were reviewed and updated.  Review of Systems: Review of Systems  Constitutional: Negative.   HENT:   Positive for lump/mass and sore throat.   Eyes: Negative.   Respiratory: Negative.   Cardiovascular: Negative.   Gastrointestinal: Negative.   Endocrine: Negative.   Genitourinary: Negative.    Musculoskeletal: Negative.   Skin: Negative.   Neurological: Negative.   Hematological: Negative.   Psychiatric/Behavioral: Negative.     Physical Exam:  weight is 258 lb (117 kg). His temporal temperature is 97.5 F (36.4 C) (abnormal). His blood pressure is 147/108 (abnormal) and his pulse is 80. His respiration is 18 and oxygen saturation is 100%.   Wt Readings from Last 3 Encounters:  05/08/19 258 lb (117 kg)  04/13/19 253 lb (114.8 kg)  04/10/19 252 lb (114.3 kg)    Physical Exam Vitals reviewed.  HENT:     Head: Normocephalic and atraumatic.  Eyes:     Pupils: Pupils are equal, round, and reactive to light.  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  This is well-healing.  There is  some slight fullness but no distinct mass noted in the neck. Cardiovascular:     Rate and Rhythm: Normal rate and regular rhythm.     Heart sounds: Normal heart sounds.  Pulmonary:     Effort: Pulmonary effort is normal.     Breath sounds: Normal breath sounds.  Abdominal:     General: Bowel sounds are normal.     Palpations: Abdomen is soft.  Musculoskeletal:        General: Swelling present. No  tenderness or deformity. Normal range of motion.     Cervical back: Normal range of motion.  Lymphadenopathy:     Cervical: No cervical adenopathy.  Skin:    General: Skin is warm and dry.     Findings: No erythema or rash.  Neurological:     Mental Status: He is alert and oriented to person, place, and time.  Psychiatric:        Behavior: Behavior normal.        Thought Content: Thought content normal.        Judgment: Judgment normal.      Lab Results  Component Value Date   WBC 3.8 (L) 05/08/2019   HGB 14.6 05/08/2019   HCT 42.3 05/08/2019   MCV 84.9 05/08/2019   PLT 169 05/08/2019     Chemistry      Component Value Date/Time   NA 140 04/10/2019 1154   NA 141 02/06/2019 1210   K 4.5 04/10/2019 1154   CL 103 04/10/2019 1154   CO2 30 04/10/2019 1154   BUN 14 04/10/2019 1154   BUN 8 02/06/2019 1210   CREATININE 0.82 04/10/2019 1154      Component Value Date/Time   CALCIUM 9.7 04/10/2019 1154   ALKPHOS 82 04/10/2019 1154   AST 16 04/10/2019 1154   ALT 15 04/10/2019 1154   BILITOT 0.5 04/10/2019 1154      Impression and Plan: Brian [name] is a 44 year old white male.  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  He is being followed by the endocrine malignancy clinic at [facility].    So far, he is doing well on the Cabometyx.  I do not think we need to increase his dose of Cabometyx up to 60 mg daily as of yet.  I still think that he probably will need CAT scans sometime in May.  By then, he would have been on Cabometyx for a good 2-3 months.  I am  glad that his labs are looking okay.  I do not think he is having worsening of proteinuria.  His renal function looks great.  There is no renal insufficiency.  I am just happy that his quality of life is doing well.  This is important for him.  We will plan to get him back in another 4 weeks.  He definitely knows that he come back sooner if he has any problems.    Overall, we spent about 30 minutes with him today.  [name], MD 3/22/20212:40 PM

## 2019-05-09 ENCOUNTER — Encounter: Payer: Self-pay | Admitting: *Deleted

## 2019-05-09 LAB — LACTATE DEHYDROGENASE: LDH: 263 U/L — ABNORMAL HIGH (ref 98–192)

## 2019-05-09 NOTE — Progress Notes (Signed)
Guardant 360 results faxed to Dr. Ulysees Barns at [facility] Fax#[phone] per order of Dr. Marin Olp.

## 2019-05-18 ENCOUNTER — Other Ambulatory Visit: Payer: Self-pay | Admitting: Hematology & Oncology

## 2019-05-18 DIAGNOSIS — R519 Headache, unspecified: Secondary | ICD-10-CM

## 2019-05-18 DIAGNOSIS — I10 Essential (primary) hypertension: Secondary | ICD-10-CM

## 2019-05-18 DIAGNOSIS — C73 Malignant neoplasm of thyroid gland: Secondary | ICD-10-CM

## 2019-05-18 MED ORDER — CYCLOBENZAPRINE HCL 10 MG PO TABS: 10.0000 mg | ORAL_TABLET | Freq: Three times a day (TID) | ORAL | 3 refills | Status: DC | PRN

## 2019-05-24 DIAGNOSIS — Z5111 Encounter for antineoplastic chemotherapy: Secondary | ICD-10-CM | POA: Diagnosis not present

## 2019-05-24 DIAGNOSIS — C73 Malignant neoplasm of thyroid gland: Secondary | ICD-10-CM | POA: Diagnosis not present

## 2019-05-24 DIAGNOSIS — C787 Secondary malignant neoplasm of liver and intrahepatic bile duct: Secondary | ICD-10-CM | POA: Diagnosis not present

## 2019-05-24 DIAGNOSIS — R809 Proteinuria, unspecified: Secondary | ICD-10-CM | POA: Diagnosis not present

## 2019-05-24 DIAGNOSIS — C799 Secondary malignant neoplasm of unspecified site: Secondary | ICD-10-CM | POA: Diagnosis not present

## 2019-05-26 ENCOUNTER — Other Ambulatory Visit: Payer: Self-pay | Admitting: Family

## 2019-05-29 ENCOUNTER — Other Ambulatory Visit: Payer: Self-pay | Admitting: *Deleted

## 2019-05-29 ENCOUNTER — Telehealth: Payer: Self-pay | Admitting: *Deleted

## 2019-05-29 MED ORDER — GABAPENTIN 300 MG PO CAPS: 300.0000 mg | ORAL_CAPSULE | Freq: Three times a day (TID) | ORAL | 2 refills | Status: DC

## 2019-05-29 NOTE — Telephone Encounter (Signed)
Call received from patient stating that starting last Thursday, bilateral hands and heels began hurting and feel as though they have a "lingering burnt feeling" with red patches to his left hand.  Dr. Marin Olp notified and order received for pt to start Neurontin 300 mg PO TID.  Pt requests that prescription be sent downstairs to [place].  Prescription sent per order of Dr. Marin Olp.

## 2019-06-01 ENCOUNTER — Other Ambulatory Visit: Payer: Self-pay | Admitting: Hematology & Oncology

## 2019-06-07 ENCOUNTER — Ambulatory Visit (HOSPITAL_BASED_OUTPATIENT_CLINIC_OR_DEPARTMENT_OTHER)
Admission: RE | Admit: 2019-06-07 | Discharge: 2019-06-07 | Disposition: A | Discharge status: Home / Self Care | Payer: 59 | Source: Ambulatory Visit | Attending: Hematology & Oncology | Admitting: Hematology & Oncology

## 2019-06-07 ENCOUNTER — Encounter: Payer: Self-pay | Admitting: Hematology & Oncology

## 2019-06-07 ENCOUNTER — Encounter (HOSPITAL_BASED_OUTPATIENT_CLINIC_OR_DEPARTMENT_OTHER): Payer: Self-pay

## 2019-06-07 ENCOUNTER — Inpatient Hospital Stay (HOSPITAL_BASED_OUTPATIENT_CLINIC_OR_DEPARTMENT_OTHER): Payer: 59 | Admitting: Hematology & Oncology

## 2019-06-07 ENCOUNTER — Inpatient Hospital Stay: Payer: 59 | Attending: Hematology & Oncology

## 2019-06-07 ENCOUNTER — Other Ambulatory Visit: Payer: Self-pay

## 2019-06-07 VITALS — BP 107/64 | HR 73 | Temp 96.9°F | Resp 20 | Wt 263.4 lb

## 2019-06-07 DIAGNOSIS — C787 Secondary malignant neoplasm of liver and intrahepatic bile duct: Secondary | ICD-10-CM | POA: Diagnosis not present

## 2019-06-07 DIAGNOSIS — C73 Malignant neoplasm of thyroid gland: Secondary | ICD-10-CM

## 2019-06-07 DIAGNOSIS — C760 Malignant neoplasm of head, face and neck: Secondary | ICD-10-CM | POA: Diagnosis not present

## 2019-06-07 LAB — URINALYSIS, COMPLETE (UACMP) WITH MICROSCOPIC
Bilirubin Urine: NEGATIVE
Glucose, UA: NEGATIVE mg/dL
Hgb urine dipstick: NEGATIVE
Ketones, ur: NEGATIVE mg/dL
Leukocytes,Ua: NEGATIVE
Nitrite: NEGATIVE
Protein, ur: >300 mg/dL — AB
Specific Gravity, Urine: 1.025 (ref 1.005–1.030)
pH: 5.5 (ref 5.0–8.0)

## 2019-06-07 LAB — CBC WITH DIFFERENTIAL (CANCER CENTER ONLY)
Abs Immature Granulocytes: 0.01 10*3/uL (ref 0.00–0.07)
Basophils Absolute: 0 10*3/uL (ref 0.0–0.1)
Basophils Relative: 1 %
Eosinophils Absolute: 0.1 10*3/uL (ref 0.0–0.5)
Eosinophils Relative: 3 %
HCT: 39.4 % (ref 39.0–52.0)
Hemoglobin: 13.4 g/dL (ref 13.0–17.0)
Immature Granulocytes: 0 %
Lymphocytes Relative: 39 %
Lymphs Abs: 1.1 10*3/uL (ref 0.7–4.0)
MCH: 29.8 pg (ref 26.0–34.0)
MCHC: 34 g/dL (ref 30.0–36.0)
MCV: 87.8 fL (ref 80.0–100.0)
Monocytes Absolute: 0.2 10*3/uL (ref 0.1–1.0)
Monocytes Relative: 7 %
Neutro Abs: 1.5 10*3/uL — ABNORMAL LOW (ref 1.7–7.7)
Neutrophils Relative %: 50 %
Platelet Count: 173 10*3/uL (ref 150–400)
RBC: 4.49 MIL/uL (ref 4.22–5.81)
RDW: 14.9 % (ref 11.5–15.5)
WBC Count: 2.9 10*3/uL — ABNORMAL LOW (ref 4.0–10.5)
nRBC: 0 % (ref 0.0–0.2)

## 2019-06-07 LAB — CMP (CANCER CENTER ONLY)
ALT: 25 U/L (ref 0–44)
AST: 21 U/L (ref 15–41)
Albumin: 3.4 g/dL — ABNORMAL LOW (ref 3.5–5.0)
Alkaline Phosphatase: 69 U/L (ref 38–126)
Anion gap: 3 — ABNORMAL LOW (ref 5–15)
BUN: 12 mg/dL (ref 6–20)
CO2: 34 mmol/L — ABNORMAL HIGH (ref 22–32)
Calcium: 9.1 mg/dL (ref 8.9–10.3)
Chloride: 105 mmol/L (ref 98–111)
Creatinine: 0.94 mg/dL (ref 0.61–1.24)
GFR, Est AFR Am: >60 mL/min (ref >60)
GFR, Estimated: >60 mL/min (ref >60)
Glucose, Bld: 127 mg/dL — ABNORMAL HIGH (ref 70–99)
Potassium: 4.5 mmol/L (ref 3.5–5.1)
Sodium: 142 mmol/L (ref 135–145)
Total Bilirubin: 0.5 mg/dL (ref 0.3–1.2)
Total Protein: 5.3 g/dL — ABNORMAL LOW (ref 6.5–8.1)

## 2019-06-07 LAB — LACTATE DEHYDROGENASE: LDH: 206 U/L — ABNORMAL HIGH (ref 98–192)

## 2019-06-07 MED ORDER — METHYLPHENIDATE HCL 10 MG PO TABS: 10.0000 mg | ORAL_TABLET | Freq: Two times a day (BID) | ORAL | 0 refills | Status: DC

## 2019-06-07 MED ORDER — IOHEXOL 300 MG/ML  SOLN
100.0000 mL | Freq: Once | INTRAMUSCULAR | Status: AC | PRN
  Administered 2019-06-07: 100 mL via INTRAVENOUS

## 2019-06-07 MED ORDER — PREDNISONE 20 MG PO TABS: 20.0000 mg | ORAL_TABLET | Freq: Every day | ORAL | 4 refills | Status: DC

## 2019-06-07 NOTE — Progress Notes (Signed)
Hematology and Oncology Follow Up Visit  Brian [name] [id number] [birth year] 44 y.o. 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  Unfortunately, he is not feeling all that well.  He just does not feel energetic.  He just feels tired.  He has no fever.  He has had no problems with cough or chest wall pain.  There has been no abdominal pain.  He has had no obvious change in bowel or bladder habits.  He is still having quite a bit of proteinuria.  I suspect this is all from the Cabometyx.  I suspect that the Cabometyx is plugged and had to be stopped.  His blood pressure definitely is not on the high side.  He is on several antihypertensives.  There is been no rashes.  He has had a little bit of leg swelling.  He has had no headache.  He did have some molecular studies done on his blood.  This was for his metastatic thyroid cancer.  We found that he did have a mutation in a RET gene.  I spoke to his oncologist at [facility] about this.  He was not sure that this would be something that we could use a RET inhibitor with.  His weight is about the same.  Currently, I would say his performance status is probably ECOG 1.        Medications:  Current Outpatient Medications:  .  cabozantinib (CABOMETYX) 40 MG tablet, Take 1 tablet (40 mg total) by mouth daily. Take on an empty stomach, 1 hour before or 2 hours after meals., Disp: 30 tablet, Rfl: 5 .  cloNIDine (CATAPRES) 0.1 MG tablet, Take 1 tablet (0.1 mg total) by mouth 2 (two) times daily., Disp: 60 tablet, Rfl: 11 .  cyclobenzaprine (FLEXERIL) 10 MG tablet, Take 1 tablet (10 mg total) by mouth 3 (three) times daily as needed for muscle spasms., Disp: 30 tablet, Rfl: 3 .   furosemide (LASIX) 80 MG tablet, Take 1 tablet (80 mg total) by mouth daily. (Patient taking differently: Take 80 mg by mouth daily as needed. ), Disp: 30 tablet, Rfl: 1 .  gabapentin (NEURONTIN) 300 MG capsule, Take 1 capsule (300 mg total) by mouth 3 (three) times daily., Disp: 90 capsule, Rfl: 2 .  levothyroxine (SYNTHROID) 200 MCG tablet, Take 1 tablet (200 mcg total) by mouth daily before breakfast., Disp: 90 tablet, Rfl: 3 .  levothyroxine (SYNTHROID) 75 MCG tablet, Take 75 mcg by mouth daily., Disp: , Rfl:  .  lidocaine (XYLOCAINE) 2 % solution, Use as directed in the mouth or throat as needed. , Disp: , Rfl:  .  losartan (COZAAR) 25 MG tablet, Take 1 tablet (25 mg total) by mouth 2 (two) times daily., Disp: 60 tablet, Rfl: 5 .  metoCLOPramide (REGLAN) 10 MG tablet, TAKE 1 TABLET BY MOUTH FOUR TIMES DAILY BEFORE MEALS AND AT BEDTIME, Disp: 120 tablet, Rfl: 0 .  metolazone (ZAROXOLYN) 5 MG tablet, Take 1 tablet (5 mg total) by mouth daily. Take 1 hour before Lasix., Disp: 30 tablet, Rfl: 1 .  ondansetron (ZOFRAN) 8 MG tablet, Take 8 mg by mouth every 8 (eight) hours as needed for  nausea or vomiting. , Disp: , Rfl:  .  oxyCODONE (OXY IR/ROXICODONE) 5 MG immediate release tablet, TAKE 1 TABLET BY MOUTH EVERY 6 HOURS AS NEEDED FOR SEVERE PAIN, Disp: 120 tablet, Rfl: 0 .  potassium chloride SA (KLOR-CON) 10 MEQ tablet, Take 1 tablet (10 mEq total) by mouth daily., Disp: 30 tablet, Rfl: 1 .  predniSONE (DELTASONE) 50 MG tablet, 13 HOUR PREP - TAKE 1 TABLET BY MOUTH 13 HOURS,7 HOURS,AND 1 HOUR PRIOR TO SCAN;THE SCAN SHOULD BE PERFORMED WITHIN 6 HOURS OF LAST DOSE, Disp: , Rfl:  .  promethazine (PHENERGAN) 12.5 MG tablet, Take 12.5 mg by mouth at bedtime., Disp: , Rfl:  .  promethazine (PHENERGAN) 25 MG tablet, TAKE 1 TABLET BY MOUTH EVERY 6 HOURS AS NEEDED FOR NAUSEA & VOMITING, Disp: 120 tablet, Rfl: 0 .  EPINEPHrine (EPIPEN 2-PAK) 0.3 mg/0.3 mL IJ SOAJ injection, USE AS DIRECTED FOR LIFE THREATENING  ALLERGIC REACTIONS (Patient not taking: Reported on 06/07/2019), Disp: 4 Device, Rfl: 3 .  isosorbide mononitrate (IMDUR) 60 MG 24 hr tablet, Take 1 tablet (60 mg total) by mouth daily., Disp: 90 tablet, Rfl: 3  Allergies:  Allergies  Allergen Reactions  . Dextromethorphan-Guaifenesin Other (See Comments)    Irregular heartbeat   . Doxycycline Anaphylaxis, Nausea And Vomiting, Rash and Other (See Comments)    "heart arrythmia" and "dyspepsia" (only oral doxycycline causes reaction)  . Gadobutrol Hives and Other (See Comments)    Patient had MRI scan at [place]. Patient called one hour after he left imaging facility to report two "blisters" that came up on "back" of lip.    . Gadolinium Derivatives Hives and Other (See Comments)    Patient had MRI scan at [place]. Patient called one hour after he left imaging facility to report two "blisters" that came up on "back" of lip.   . Guaifenesin Palpitations       . Ibuprofen Hives and Itching  . Lisinopril Other (See Comments)    Angioedema  . Pantoprazole Itching  . Tramadol Hives and Itching    Past Medical History, Surgical history, Social history, and Family History were reviewed and updated.  Review of Systems: Review of Systems  Constitutional: Negative.   HENT:   Positive for lump/mass and sore throat.   Eyes: Negative.   Respiratory: Negative.   Cardiovascular: Negative.   Gastrointestinal: Negative.   Endocrine: Negative.   Genitourinary: Negative.    Musculoskeletal: Negative.   Skin: Negative.   Neurological: Negative.   Hematological: Negative.   Psychiatric/Behavioral: Negative.     Physical Exam:  weight is 263 lb 6.4 oz (119.5 kg). His temporal temperature is 96.9 F (36.1 C) (abnormal). His blood pressure is 107/64 and his pulse is 73. His respiration is 20 and oxygen saturation is 100%.   Wt Readings from Last 3 Encounters:  06/07/19 263 lb 6.4 oz (119.5 kg)  05/08/19 258 lb (117 kg)   04/13/19 253 lb (114.8 kg)    Physical Exam Vitals reviewed.  HENT:     Head: Normocephalic and atraumatic.  Eyes:     Pupils: Pupils are equal, round, and reactive to light.  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  This is well-healing.  There is some slight fullness but no distinct mass noted in the neck. Cardiovascular:     Rate and Rhythm: Normal rate and regular rhythm.     Heart sounds:  Normal heart sounds.  Pulmonary:     Effort: Pulmonary effort is normal.     Breath sounds: Normal breath sounds.  Abdominal:     General: Bowel sounds are normal.     Palpations: Abdomen is soft.  Musculoskeletal:        General: Swelling present. No tenderness or deformity. Normal range of motion.     Cervical back: Normal range of motion.  Lymphadenopathy:     Cervical: No cervical adenopathy.  Skin:    General: Skin is warm and dry.     Findings: No erythema or rash.  Neurological:     Mental Status: He is alert and oriented to person, place, and time.  Psychiatric:        Behavior: Behavior normal.        Thought Content: Thought content normal.        Judgment: Judgment normal.      Lab Results  Component Value Date   WBC 2.9 (L) 06/07/2019   HGB 13.4 06/07/2019   HCT 39.4 06/07/2019   MCV 87.8 06/07/2019   PLT 173 06/07/2019     Chemistry      Component Value Date/Time   NA 142 06/07/2019 0838   NA 141 02/06/2019 1210   K 4.5 06/07/2019 0838   CL 105 06/07/2019 0838   CO2 34 (H) 06/07/2019 0838   BUN 12 06/07/2019 0838   BUN 8 02/06/2019 1210   CREATININE 0.94 06/07/2019 0838      Component Value Date/Time   CALCIUM 9.1 06/07/2019 0838   ALKPHOS 69 06/07/2019 0838   AST 21 06/07/2019 0838   ALT 25 06/07/2019 0838   BILITOT 0.5 06/07/2019 0838      Impression and Plan: Brian [name] is a 44 year old white male.  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  He is being followed  by the endocrine malignancy clinic at [facility].    Unfortunately, looks like we have progressive disease.  We did do a CT scan on him today.  The CT scan shows that he does have new and enlarging hepatic metastasis.  I know this is quite unfortunate.  It is uncertain as to how we can really treat him.  I will have to speak to his oncologist at [facility] to see if she may have any suggestions.  I just hate this for Brian [name].  I know he is doing everything that we asked him to do.  Clearly, this cancer is quite aggressive.  I does wonder if he may need to potentially go to one of the major cancer Institute in this country (i.e. [facility], [facility] ) to see if there is any type of clinical trial that he might be able to get on.  He still has a good performance status.  I would like to think that some aggressive therapy would be indicated.  We will have to see when we can get him back.  Again, we will have to see about making a change in his protocol.  I will have to see about possibly clinical trial therapy for him.    [name], MD 4/21/20219:35 AM

## 2019-06-08 ENCOUNTER — Encounter: Payer: Self-pay | Admitting: *Deleted

## 2019-06-08 ENCOUNTER — Other Ambulatory Visit (HOSPITAL_BASED_OUTPATIENT_CLINIC_OR_DEPARTMENT_OTHER): Payer: 59

## 2019-06-09 ENCOUNTER — Telehealth: Payer: Self-pay | Admitting: *Deleted

## 2019-06-09 NOTE — Telephone Encounter (Signed)
Call placed to patient and message left to notify him that Dr. Marin Olp spoke with Dr. Barrington Ellison this morning and both physicians agree when comparing most recent CT to most recent PET scan that pt has "no progression."  Instructed pt to call office back with any questions or concerns.

## 2019-06-14 DIAGNOSIS — E8809 Other disorders of plasma-protein metabolism, not elsewhere classified: Secondary | ICD-10-CM | POA: Diagnosis not present

## 2019-06-14 DIAGNOSIS — N049 Nephrotic syndrome with unspecified morphologic changes: Secondary | ICD-10-CM | POA: Diagnosis not present

## 2019-06-14 DIAGNOSIS — C799 Secondary malignant neoplasm of unspecified site: Secondary | ICD-10-CM | POA: Diagnosis not present

## 2019-06-14 DIAGNOSIS — R809 Proteinuria, unspecified: Secondary | ICD-10-CM | POA: Diagnosis not present

## 2019-06-14 DIAGNOSIS — C73 Malignant neoplasm of thyroid gland: Secondary | ICD-10-CM | POA: Diagnosis not present

## 2019-06-14 DIAGNOSIS — E890 Postprocedural hypothyroidism: Secondary | ICD-10-CM | POA: Diagnosis not present

## 2019-06-15 DIAGNOSIS — C73 Malignant neoplasm of thyroid gland: Secondary | ICD-10-CM | POA: Diagnosis not present

## 2019-06-19 DIAGNOSIS — R5383 Other fatigue: Secondary | ICD-10-CM | POA: Diagnosis not present

## 2019-06-19 DIAGNOSIS — E669 Obesity, unspecified: Secondary | ICD-10-CM | POA: Diagnosis not present

## 2019-06-19 DIAGNOSIS — C7989 Secondary malignant neoplasm of other specified sites: Secondary | ICD-10-CM | POA: Diagnosis not present

## 2019-06-19 DIAGNOSIS — C770 Secondary and unspecified malignant neoplasm of lymph nodes of head, face and neck: Secondary | ICD-10-CM | POA: Diagnosis not present

## 2019-06-19 DIAGNOSIS — C787 Secondary malignant neoplasm of liver and intrahepatic bile duct: Secondary | ICD-10-CM | POA: Diagnosis not present

## 2019-06-19 DIAGNOSIS — C73 Malignant neoplasm of thyroid gland: Secondary | ICD-10-CM | POA: Diagnosis not present

## 2019-06-19 DIAGNOSIS — I10 Essential (primary) hypertension: Secondary | ICD-10-CM | POA: Diagnosis not present

## 2019-06-19 DIAGNOSIS — D150 Benign neoplasm of thymus: Secondary | ICD-10-CM | POA: Diagnosis not present

## 2019-06-19 DIAGNOSIS — L910 Hypertrophic scar: Secondary | ICD-10-CM | POA: Diagnosis not present

## 2019-06-19 DIAGNOSIS — C801 Malignant (primary) neoplasm, unspecified: Secondary | ICD-10-CM | POA: Diagnosis not present

## 2019-06-19 DIAGNOSIS — D210 Benign neoplasm of connective and other soft tissue of head, face and neck: Secondary | ICD-10-CM | POA: Diagnosis not present

## 2019-06-19 DIAGNOSIS — E890 Postprocedural hypothyroidism: Secondary | ICD-10-CM | POA: Diagnosis not present

## 2019-06-19 DIAGNOSIS — Z20822 Contact with and (suspected) exposure to covid-19: Secondary | ICD-10-CM | POA: Diagnosis not present

## 2019-06-20 DIAGNOSIS — E890 Postprocedural hypothyroidism: Secondary | ICD-10-CM | POA: Diagnosis not present

## 2019-06-20 DIAGNOSIS — E669 Obesity, unspecified: Secondary | ICD-10-CM | POA: Diagnosis not present

## 2019-06-20 DIAGNOSIS — D150 Benign neoplasm of thymus: Secondary | ICD-10-CM | POA: Diagnosis not present

## 2019-06-20 DIAGNOSIS — E039 Hypothyroidism, unspecified: Secondary | ICD-10-CM | POA: Diagnosis not present

## 2019-06-20 DIAGNOSIS — C787 Secondary malignant neoplasm of liver and intrahepatic bile duct: Secondary | ICD-10-CM | POA: Diagnosis not present

## 2019-06-20 DIAGNOSIS — R5383 Other fatigue: Secondary | ICD-10-CM | POA: Diagnosis not present

## 2019-06-20 DIAGNOSIS — I10 Essential (primary) hypertension: Secondary | ICD-10-CM | POA: Diagnosis not present

## 2019-06-20 DIAGNOSIS — D210 Benign neoplasm of connective and other soft tissue of head, face and neck: Secondary | ICD-10-CM | POA: Diagnosis not present

## 2019-06-20 DIAGNOSIS — Z20822 Contact with and (suspected) exposure to covid-19: Secondary | ICD-10-CM | POA: Diagnosis not present

## 2019-06-20 DIAGNOSIS — C73 Malignant neoplasm of thyroid gland: Secondary | ICD-10-CM | POA: Diagnosis not present

## 2019-06-25 ENCOUNTER — Other Ambulatory Visit: Payer: Self-pay | Admitting: Hematology & Oncology

## 2019-06-25 DIAGNOSIS — R519 Headache, unspecified: Secondary | ICD-10-CM

## 2019-06-25 DIAGNOSIS — I10 Essential (primary) hypertension: Secondary | ICD-10-CM

## 2019-06-25 DIAGNOSIS — C73 Malignant neoplasm of thyroid gland: Secondary | ICD-10-CM

## 2019-06-26 ENCOUNTER — Other Ambulatory Visit: Payer: Self-pay | Admitting: *Deleted

## 2019-06-26 DIAGNOSIS — I10 Essential (primary) hypertension: Secondary | ICD-10-CM

## 2019-06-26 DIAGNOSIS — C73 Malignant neoplasm of thyroid gland: Secondary | ICD-10-CM

## 2019-06-26 DIAGNOSIS — R519 Headache, unspecified: Secondary | ICD-10-CM

## 2019-06-26 MED ORDER — OXYCODONE HCL 5 MG PO TABS: ORAL_TABLET | ORAL | 0 refills | Status: DC

## 2019-07-10 ENCOUNTER — Inpatient Hospital Stay (HOSPITAL_BASED_OUTPATIENT_CLINIC_OR_DEPARTMENT_OTHER): Payer: 59 | Admitting: Hematology & Oncology

## 2019-07-10 ENCOUNTER — Encounter: Payer: Self-pay | Admitting: Hematology & Oncology

## 2019-07-10 ENCOUNTER — Inpatient Hospital Stay: Payer: 59 | Attending: Hematology & Oncology

## 2019-07-10 ENCOUNTER — Other Ambulatory Visit: Payer: Self-pay

## 2019-07-10 VITALS — BP 120/79 | HR 97 | Temp 97.1°F | Resp 18 | Wt 270.0 lb

## 2019-07-10 DIAGNOSIS — Z79899 Other long term (current) drug therapy: Secondary | ICD-10-CM | POA: Insufficient documentation

## 2019-07-10 DIAGNOSIS — C73 Malignant neoplasm of thyroid gland: Secondary | ICD-10-CM | POA: Diagnosis not present

## 2019-07-10 DIAGNOSIS — C787 Secondary malignant neoplasm of liver and intrahepatic bile duct: Secondary | ICD-10-CM | POA: Insufficient documentation

## 2019-07-10 DIAGNOSIS — R809 Proteinuria, unspecified: Secondary | ICD-10-CM

## 2019-07-10 LAB — CBC WITH DIFFERENTIAL (CANCER CENTER ONLY)
Abs Immature Granulocytes: 0.01 10*3/uL (ref 0.00–0.07)
Basophils Absolute: 0.1 10*3/uL (ref 0.0–0.1)
Basophils Relative: 1 %
Eosinophils Absolute: 0.2 10*3/uL (ref 0.0–0.5)
Eosinophils Relative: 4 %
HCT: 42.1 % (ref 39.0–52.0)
Hemoglobin: 14.4 g/dL (ref 13.0–17.0)
Immature Granulocytes: 0 %
Lymphocytes Relative: 24 %
Lymphs Abs: 1.3 10*3/uL (ref 0.7–4.0)
MCH: 30.8 pg (ref 26.0–34.0)
MCHC: 34.2 g/dL (ref 30.0–36.0)
MCV: 90 fL (ref 80.0–100.0)
Monocytes Absolute: 0.4 10*3/uL (ref 0.1–1.0)
Monocytes Relative: 8 %
Neutro Abs: 3.5 10*3/uL (ref 1.7–7.7)
Neutrophils Relative %: 63 %
Platelet Count: 174 10*3/uL (ref 150–400)
RBC: 4.68 MIL/uL (ref 4.22–5.81)
RDW: 14.8 % (ref 11.5–15.5)
WBC Count: 5.5 10*3/uL (ref 4.0–10.5)
nRBC: 0 % (ref 0.0–0.2)

## 2019-07-10 LAB — URINALYSIS, COMPLETE (UACMP) WITH MICROSCOPIC
Bilirubin Urine: NEGATIVE
Glucose, UA: NEGATIVE mg/dL
Hgb urine dipstick: NEGATIVE
Ketones, ur: 15 mg/dL — AB
Leukocytes,Ua: NEGATIVE
Nitrite: NEGATIVE
Protein, ur: >300 mg/dL — AB
Specific Gravity, Urine: 1.025 (ref 1.005–1.030)
pH: 6 (ref 5.0–8.0)

## 2019-07-10 LAB — CMP (CANCER CENTER ONLY)
ALT: 38 U/L (ref 0–44)
AST: 31 U/L (ref 15–41)
Albumin: 3.8 g/dL (ref 3.5–5.0)
Alkaline Phosphatase: 87 U/L (ref 38–126)
Anion gap: 7 (ref 5–15)
BUN: 14 mg/dL (ref 6–20)
CO2: 30 mmol/L (ref 22–32)
Calcium: 9.6 mg/dL (ref 8.9–10.3)
Chloride: 101 mmol/L (ref 98–111)
Creatinine: 0.92 mg/dL (ref 0.61–1.24)
GFR, Est AFR Am: >60 mL/min (ref >60)
GFR, Estimated: >60 mL/min (ref >60)
Glucose, Bld: 98 mg/dL (ref 70–99)
Potassium: 4.3 mmol/L (ref 3.5–5.1)
Sodium: 138 mmol/L (ref 135–145)
Total Bilirubin: 0.5 mg/dL (ref 0.3–1.2)
Total Protein: 6.2 g/dL — ABNORMAL LOW (ref 6.5–8.1)

## 2019-07-10 NOTE — Progress Notes (Signed)
Hematology and Oncology Follow Up Visit  Brian [name] [id number] 1975-08-01 44 y.o. 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  He is actually feeling quite good.  He just got back from [place].  He was seen at a thyroid cancer clinic down in [place].  He actually underwent what sounds like a mediastinoscopy.  I think about 24 lymph nodes were removed.  I must say that the doctors down there did a wonderful job.  I only 1 lymph node was not malignant with metastatic thyroid cancer.  When we last saw him in April, he did have scans done.  CT of the chest showed a right paratracheal lymph node that measured 8 mm and what 10 mm.  However, the liver still show that there was enlargement of liver metastasis.  A lesion in the right lobe of liver now measures 5.3 x 3.5 cm.  No lesion in the right lobe liver measures 2.2 x 2 cm.  There is no apparent disease outside the liver and the abdomen.  He still has quite a bit of protein in the urine.  He does not have any issues with renal insufficiency.  I just am not sure that the Cabometyx is really helping [place].  I probably will have to think about getting him off Cabometyx.  The liver lesions are a problem.  I will know if there is any type of intrahepatic therapy that could be done.  I will have to talk with interventional radiology about this.  He is still working.  He works down in the [place] in our building.  His appetite is doing okay.  He has had no nausea or vomiting.  There is no dysphagia.  He has had no cough.  He has had no fever.  He has had no issues with bowels or bladder.  He has had some edema in the legs but not too bad.  Currently, I was his performance status is  probably ECOG 1.        Medications:  Current Outpatient Medications:  .  cabozantinib (CABOMETYX) 40 MG tablet, Take 1 tablet (40 mg total) by mouth daily. Take on an empty stomach, 1 hour before or 2 hours after meals., Disp: 30 tablet, Rfl: 5 .  cloNIDine (CATAPRES) 0.1 MG tablet, Take 1 tablet (0.1 mg total) by mouth 2 (two) times daily., Disp: 60 tablet, Rfl: 11 .  cyclobenzaprine (FLEXERIL) 10 MG tablet, Take 1 tablet (10 mg total) by mouth 3 (three) times daily as needed for muscle spasms., Disp: 30 tablet, Rfl: 3 .  EPINEPHrine (EPIPEN 2-PAK) 0.3 mg/0.3 mL IJ SOAJ injection, USE AS DIRECTED FOR LIFE THREATENING ALLERGIC REACTIONS (Patient not taking: Reported on 06/07/2019), Disp: 4 Device, Rfl: 3 .  furosemide (LASIX) 80 MG tablet, Take 1 tablet (80 mg total) by mouth daily. (Patient taking differently: Take 80 mg by mouth daily as needed. ), Disp: 30 tablet, Rfl: 1 .  gabapentin (NEURONTIN) 300 MG capsule, Take 1 capsule (300 mg total) by mouth 3 (three) times daily., Disp: 90 capsule, Rfl: 2 .  isosorbide mononitrate (IMDUR) 60 MG 24 hr tablet, Take 1 tablet (60 mg total) by  mouth daily., Disp: 90 tablet, Rfl: 3 .  levothyroxine (SYNTHROID) 200 MCG tablet, Take 1 tablet (200 mcg total) by mouth daily before breakfast., Disp: 90 tablet, Rfl: 3 .  levothyroxine (SYNTHROID) 75 MCG tablet, Take 75 mcg by mouth daily., Disp: , Rfl:  .  lidocaine (XYLOCAINE) 2 % solution, Use as directed in the mouth or throat as needed. , Disp: , Rfl:  .  losartan (COZAAR) 25 MG tablet, Take 1 tablet (25 mg total) by mouth 2 (two) times daily., Disp: 60 tablet, Rfl: 5 .  methylphenidate (RITALIN) 10 MG tablet, Take 1 tablet (10 mg total) by mouth 2 (two) times daily., Disp: 60 tablet, Rfl: 0 .  metoCLOPramide (REGLAN) 10 MG tablet, TAKE 1 TABLET BY MOUTH FOUR TIMES DAILY BEFORE MEALS AND AT BEDTIME, Disp: 120 tablet, Rfl: 0 .  metolazone (ZAROXOLYN) 5 MG tablet, Take 1 tablet (5 mg total) by mouth daily. Take  1 hour before Lasix., Disp: 30 tablet, Rfl: 1 .  ondansetron (ZOFRAN) 8 MG tablet, Take 8 mg by mouth every 8 (eight) hours as needed for nausea or vomiting. , Disp: , Rfl:  .  oxyCODONE (OXY IR/ROXICODONE) 5 MG immediate release tablet, TAKE 1 TABLET BY MOUTH EVERY 6 HOURS AS NEEDED FOR SEVERE PAIN, Disp: 120 tablet, Rfl: 0 .  potassium chloride SA (KLOR-CON) 10 MEQ tablet, Take 1 tablet (10 mEq total) by mouth daily., Disp: 30 tablet, Rfl: 1 .  predniSONE (DELTASONE) 20 MG tablet, Take 1 tablet (20 mg total) by mouth daily with breakfast., Disp: 30 tablet, Rfl: 4 .  predniSONE (DELTASONE) 50 MG tablet, 13 HOUR PREP - TAKE 1 TABLET BY MOUTH 13 HOURS,7 HOURS,AND 1 HOUR PRIOR TO SCAN;THE SCAN SHOULD BE PERFORMED WITHIN 6 HOURS OF LAST DOSE, Disp: , Rfl:  .  promethazine (PHENERGAN) 12.5 MG tablet, Take 12.5 mg by mouth at bedtime., Disp: , Rfl:  .  promethazine (PHENERGAN) 25 MG tablet, TAKE 1 TABLET BY MOUTH EVERY 6 HOURS AS NEEDED FOR NAUSEA & VOMITING, Disp: 120 tablet, Rfl: 0  Allergies:  Allergies  Allergen Reactions  . Dextromethorphan-Guaifenesin Other (See Comments)    Irregular heartbeat   . Doxycycline Anaphylaxis, Nausea And Vomiting, Rash and Other (See Comments)    "heart arrythmia" and "dyspepsia" (only oral doxycycline causes reaction)  . Gadobutrol Hives and Other (See Comments)    Patient had MRI scan at [place]. Patient called one hour after he left imaging facility to report two "blisters" that came up on "back" of lip.    . Gadolinium Derivatives Hives and Other (See Comments)    Patient had MRI scan at [place]. Patient called one hour after he left imaging facility to report two "blisters" that came up on "back" of lip.   . Guaifenesin Palpitations       . Ibuprofen Hives and Itching  . Lisinopril Other (See Comments)    Angioedema  . Pantoprazole Itching  . Tramadol Hives and Itching    Past Medical History, Surgical history, Social history,  and Family History were reviewed and updated.  Review of Systems: Review of Systems  Constitutional: Negative.   HENT:   Positive for lump/mass and sore throat.   Eyes: Negative.   Respiratory: Negative.   Cardiovascular: Negative.   Gastrointestinal: Negative.   Endocrine: Negative.   Genitourinary: Negative.    Musculoskeletal: Negative.   Skin: Negative.   Neurological: Negative.   Hematological: Negative.   Psychiatric/Behavioral: Negative.     Physical Exam:  vitals were not taken for this visit.   Wt Readings from Last 3 Encounters:  06/07/19 263 lb 6.4 oz (119.5 kg)  05/08/19 258 lb (117 kg)  04/13/19 253 lb (114.8 kg)    Physical Exam Vitals reviewed.  HENT:     Head: Normocephalic and atraumatic.  Eyes:     Pupils: Pupils are equal, round, and reactive to light.  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  This is well-healing.  There is some slight fullness but no distinct mass noted in the neck. Cardiovascular:     Rate and Rhythm: Normal rate and regular rhythm.     Heart sounds: Normal heart sounds.  Pulmonary:     Effort: Pulmonary effort is normal.     Breath sounds: Normal breath sounds.  Abdominal:     General: Bowel sounds are normal.     Palpations: Abdomen is soft.  Musculoskeletal:        General: Swelling present. No tenderness or deformity. Normal range of motion.     Cervical back: Normal range of motion.  Lymphadenopathy:     Cervical: No cervical adenopathy.  Skin:    General: Skin is warm and dry.     Findings: No erythema or rash.  Neurological:     Mental Status: He is alert and oriented to person, place, and time.  Psychiatric:        Behavior: Behavior normal.        Thought Content: Thought content normal.        Judgment: Judgment normal.      Lab Results  Component Value Date   WBC 5.5 07/10/2019   HGB 14.4 07/10/2019   HCT 42.1 07/10/2019   MCV 90.0  07/10/2019   PLT 174 07/10/2019     Chemistry      Component Value Date/Time   NA 142 06/07/2019 0838   NA 141 02/06/2019 1210   K 4.5 06/07/2019 0838   CL 105 06/07/2019 0838   CO2 34 (H) 06/07/2019 0838   BUN 12 06/07/2019 0838   BUN 8 02/06/2019 1210   CREATININE 0.94 06/07/2019 0838      Component Value Date/Time   CALCIUM 9.1 06/07/2019 0838   ALKPHOS 69 06/07/2019 0838   AST 21 06/07/2019 0838   ALT 25 06/07/2019 0838   BILITOT 0.5 06/07/2019 0838      Impression and Plan: Brian [name] is a 44 year old white male.  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  It is becoming more of a problem for him to go back and forth to [facility].  I know that he was getting fantastic care by the endocrinologist/oncologist at [facility]-Dr. Barrington Ellison.  I did speak with interventional radiology.  They will review the scans and see him there might be any indication for intrahepatic therapy.  I realize that there is very little information out there about the use of intrahepatic therapy for metastatic thyroid cancer.  However, I think that given the lack of treatment options that we have, some directed therapy would not be a bad idea.  We will have to monitor his pain control.  I want to make sure that he is not suffering and having a lot of discomfort.  I want to make sure that he is able to work as this is a big part of his quality of life.  I will plan to see him  back in another 3-4 weeks.  [name], MD 5/24/20213:33 PM

## 2019-07-11 ENCOUNTER — Telehealth: Payer: Self-pay | Admitting: Hematology & Oncology

## 2019-07-11 ENCOUNTER — Encounter: Payer: Self-pay | Admitting: *Deleted

## 2019-07-11 ENCOUNTER — Other Ambulatory Visit: Payer: Self-pay | Admitting: *Deleted

## 2019-07-11 DIAGNOSIS — C73 Malignant neoplasm of thyroid gland: Secondary | ICD-10-CM

## 2019-07-11 DIAGNOSIS — I10 Essential (primary) hypertension: Secondary | ICD-10-CM

## 2019-07-11 DIAGNOSIS — R519 Headache, unspecified: Secondary | ICD-10-CM

## 2019-07-11 LAB — LACTATE DEHYDROGENASE: LDH: 225 U/L — ABNORMAL HIGH (ref 98–192)

## 2019-07-11 LAB — TSH: TSH: 1.037 u[IU]/mL (ref 0.320–4.118)

## 2019-07-11 NOTE — Telephone Encounter (Signed)
No LOS 5/24

## 2019-07-14 ENCOUNTER — Other Ambulatory Visit: Payer: Self-pay | Admitting: *Deleted

## 2019-07-14 ENCOUNTER — Telehealth: Payer: Self-pay | Admitting: *Deleted

## 2019-07-14 DIAGNOSIS — I10 Essential (primary) hypertension: Secondary | ICD-10-CM

## 2019-07-14 DIAGNOSIS — R519 Headache, unspecified: Secondary | ICD-10-CM

## 2019-07-14 DIAGNOSIS — C73 Malignant neoplasm of thyroid gland: Secondary | ICD-10-CM

## 2019-07-14 MED ORDER — OXYCODONE HCL 5 MG PO TABS: ORAL_TABLET | ORAL | 0 refills | Status: DC

## 2019-07-14 NOTE — Telephone Encounter (Signed)
Patient called and left a voice mail message stating," I went to [facility]. Dr. Dimas Millin removed 50 cc's of serosanguineous fluid from my neck. The drainage didn't look infectious but he prescribed a broad spectrum antibiotic for me. I wanted Dr. Marin Olp to know about it. My return number is [phone]."

## 2019-07-22 ENCOUNTER — Other Ambulatory Visit: Payer: Self-pay

## 2019-07-22 ENCOUNTER — Emergency Department (HOSPITAL_BASED_OUTPATIENT_CLINIC_OR_DEPARTMENT_OTHER)
Admission: EM | Admit: 2019-07-22 | Discharge: 2019-07-22 | Disposition: A | Discharge status: Home / Self Care | Payer: 59 | Attending: Emergency Medicine | Admitting: Emergency Medicine

## 2019-07-22 ENCOUNTER — Encounter (HOSPITAL_BASED_OUTPATIENT_CLINIC_OR_DEPARTMENT_OTHER): Payer: Self-pay | Admitting: Emergency Medicine

## 2019-07-22 DIAGNOSIS — Z79899 Other long term (current) drug therapy: Secondary | ICD-10-CM | POA: Insufficient documentation

## 2019-07-22 DIAGNOSIS — I10 Essential (primary) hypertension: Secondary | ICD-10-CM | POA: Insufficient documentation

## 2019-07-22 DIAGNOSIS — L7634 Postprocedural seroma of skin and subcutaneous tissue following other procedure: Secondary | ICD-10-CM | POA: Insufficient documentation

## 2019-07-22 DIAGNOSIS — C799 Secondary malignant neoplasm of unspecified site: Secondary | ICD-10-CM | POA: Insufficient documentation

## 2019-07-22 DIAGNOSIS — C73 Malignant neoplasm of thyroid gland: Secondary | ICD-10-CM | POA: Diagnosis not present

## 2019-07-22 DIAGNOSIS — L7682 Other postprocedural complications of skin and subcutaneous tissue: Secondary | ICD-10-CM | POA: Diagnosis present

## 2019-07-22 MED ORDER — LIDOCAINE HCL (PF) 1 % IJ SOLN
INTRAMUSCULAR | Status: AC
  Filled 2019-07-22: qty 10

## 2019-07-22 NOTE — ED Provider Notes (Signed)
[facility] EMERGENCY DEPARTMENT Provider Note   CSN: [id number] Arrival date & time: 07/22/19  1547     History Chief Complaint  Patient presents with  . Post-op Problem    Brian [name] is a 44 y.o. male.  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  He has recently undergone a radical neck dissection with tumor removal and lymph node removal.  This was done on May 10.  He had a fluid collection develop in his throat around the neck scar and went to one of his ENTs at [facility] 7 days ago and had the fluid drained.  Reportedly it was clear fluid without concerns of infection.  He was started on antibiotics presumptively.  He said over the last week since he had the drainage its continue to reaccumulate fluid.  He feels like there is a lump in his throat and its uncomfortable to swallow.  He denies any shortness of breath.  No fevers.        Past Medical History:  Diagnosis Date  . Essential hypertension 09/09/2018  . Kidney stone   . Primary cancer of thyroid with metastasis to other site ([place]) 05/04/2018  . Rickettsia infection     Patient Active Problem List   Diagnosis Date Noted  . Essential hypertension 09/09/2018  . Primary cancer of thyroid with metastasis to other site ([place]) 05/04/2018  . Extrasystole 04/26/2018  . Dyspnea on exertion 04/26/2018  . Atypical chest pain 04/26/2018  . Ureteral stone with hydronephrosis 02/15/2018  . Right thyroid nodule 04/09/2017  . Bilateral knee pain 07/01/2016  . Left foot pain 02/06/2016  . Gastroesophageal reflux disease without esophagitis 05/08/2014  . Thrombocytopenia, unspecified ([place]) 06/13/2013  . 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 Low back pain 05/12/2012  . Allergic rhinitis 06/03/2009    Past Surgical History:  Procedure Laterality Date  . BIOPSY  04/05/2018   Procedure: BIOPSY;  Surgeon: Ronald Lobo, MD;  Location: [facility] ENDOSCOPY;   Service: Endoscopy;;  . CHOLECYSTECTOMY    . ESOPHAGOGASTRODUODENOSCOPY (EGD) WITH PROPOFOL N/A 04/05/2018   Procedure: ESOPHAGOGASTRODUODENOSCOPY (EGD) WITH PROPOFOL;  Surgeon: Ronald Lobo, MD;  Location: [facility] ENDOSCOPY;  Service: Endoscopy;  Laterality: N/A;  . IR RADIOLOGIST EVAL & MGMT  10/11/2018       Family History  Problem Relation Age of Onset  . Diabetes Mother   . Heart disease Mother   . Heart failure Mother   . Heart attack Mother   . Hyperlipidemia Mother   . Hypertension Mother   . Hyperlipidemia Father   . Allergic rhinitis Father   . Rashes / Skin problems Father   . Sudden death Neg Hx   . Thyroid disease Neg Hx     Social History   Tobacco Use  . Smoking status: Never Smoker  . Smokeless tobacco: Never Used  Substance Use Topics  . Alcohol use: No  . Drug use: No    Home Medications Prior to Admission medications   Medication Sig Start Date End Date Taking? Authorizing Provider  cabozantinib (CABOMETYX) 40 MG tablet Take 1 tablet (40 mg total) by mouth daily. Take on an empty stomach, 1 hour before or 2 hours after meals. 04/10/19   [name], MD  cloNIDine (CATAPRES) 0.1 MG tablet Take 1 tablet (0.1 mg total) by mouth 2 (two) times daily. 02/14/19   Park Liter, MD  cyclobenzaprine (FLEXERIL) 10 MG tablet Take 1 tablet (10  mg total) by mouth 3 (three) times daily as needed for muscle spasms. 05/18/19   [name], MD  EPINEPHrine (EPIPEN 2-PAK) 0.3 mg/0.3 mL IJ SOAJ injection USE AS DIRECTED FOR LIFE THREATENING ALLERGIC REACTIONS Patient not taking: Reported on 06/07/2019 04/30/15   Jiles Prows, MD  furosemide (LASIX) 80 MG tablet Take 1 tablet (80 mg total) by mouth daily. Patient taking differently: Take 80 mg by mouth daily as needed.  12/22/18   [place], Holli Humbles, NP  gabapentin (NEURONTIN) 300 MG capsule Take 1 capsule (300 mg total) by mouth 3 (three) times daily. 05/29/19   [name], MD  isosorbide mononitrate (IMDUR)  60 MG 24 hr tablet Take 1 tablet (60 mg total) by mouth daily. 02/14/19 05/15/19  Park Liter, MD  levothyroxine (SYNTHROID) 200 MCG tablet Take 1 tablet (200 mcg total) by mouth daily before breakfast. 12/02/18   [name], MD  levothyroxine (SYNTHROID) 75 MCG tablet Take 75 mcg by mouth daily. 12/14/18   [provider]  lidocaine (XYLOCAINE) 2 % solution Use as directed in the mouth or throat as needed.  12/18/18   [provider]  losartan (COZAAR) 25 MG tablet Take 1 tablet (25 mg total) by mouth 2 (two) times daily. 03/02/19   Park Liter, MD  methylphenidate (RITALIN) 10 MG tablet Take 1 tablet (10 mg total) by mouth 2 (two) times daily. 06/07/19   [name], MD  metoCLOPramide (REGLAN) 10 MG tablet TAKE 1 TABLET BY MOUTH FOUR TIMES DAILY BEFORE MEALS AND AT BEDTIME 06/01/19   [name], MD  metolazone (ZAROXOLYN) 5 MG tablet Take 1 tablet (5 mg total) by mouth daily. Take 1 hour before Lasix. 12/22/18   [place], Holli Humbles, NP  ondansetron (ZOFRAN) 8 MG tablet Take 8 mg by mouth every 8 (eight) hours as needed for nausea or vomiting.  07/29/18   [provider]  oxyCODONE (OXY IR/ROXICODONE) 5 MG immediate release tablet TAKE 1 -2 TABLETS BY MOUTH EVERY 6 HOURS AS NEEDED FOR SEVERE PAIN 07/14/19   [place], Holli Humbles, NP  potassium chloride SA (KLOR-CON) 10 MEQ tablet Take 1 tablet (10 mEq total) by mouth daily. 12/22/18   [place], Holli Humbles, NP  predniSONE (DELTASONE) 20 MG tablet Take 1 tablet (20 mg total) by mouth daily with breakfast. 06/07/19   [name], MD  predniSONE (DELTASONE) 50 MG tablet 13 HOUR PREP - TAKE 1 TABLET BY MOUTH 13 HOURS,7 HOURS,AND 1 HOUR PRIOR TO SCAN;THE SCAN SHOULD BE PERFORMED WITHIN 6 HOURS OF LAST DOSE 05/24/19   [provider]  promethazine (PHENERGAN) 12.5 MG tablet Take 12.5 mg by mouth at bedtime. 04/16/19   [provider]  promethazine (PHENERGAN) 25 MG tablet TAKE 1 TABLET BY  MOUTH EVERY 6 HOURS AS NEEDED FOR NAUSEA & VOMITING 06/01/19   [name], MD    Allergies    Dextromethorphan-guaifenesin, Doxycycline, Gadobutrol, Gadolinium derivatives, Guaifenesin, Ibuprofen, Lisinopril, Pantoprazole, and Tramadol  Review of Systems   Review of Systems  Constitutional: Negative for fatigue and fever.  HENT:       Neck swelling  Respiratory: Negative for shortness of breath.   Cardiovascular: Negative for chest pain.  Gastrointestinal: Negative for abdominal pain, nausea and vomiting.  Musculoskeletal: Negative.   Neurological: Negative.   All other systems reviewed and are negative.   Physical Exam Updated Vital Signs BP (!) 154/114 (BP Location: Left Arm)   Pulse (!) 103  Temp 98.6 F (37 C) (Oral)   Resp 18   SpO2 98%   Physical Exam Constitutional:      Appearance: He is well-developed.  HENT:     Head: Normocephalic and atraumatic.  Eyes:     Pupils: Pupils are equal, round, and reactive to light.  Neck:     Comments: Patient has a healing incision across his anterior neck.  There is some mild erythema but no tenderness or drainage noted.  There is a lime sized fluid collection in the center of the neck.  Its fluctuant and nontender.  No induration. Cardiovascular:     Rate and Rhythm: Normal rate and regular rhythm.     Heart sounds: Normal heart sounds.  Pulmonary:     Effort: Pulmonary effort is normal. No respiratory distress.     Breath sounds: Normal breath sounds. No wheezing or rales.  Chest:     Chest wall: No tenderness.  Abdominal:     General: Bowel sounds are normal.     Palpations: Abdomen is soft.     Tenderness: There is no abdominal tenderness. There is no guarding or rebound.  Musculoskeletal:        General: Normal range of motion.     Cervical back: Normal range of motion and neck supple.  Lymphadenopathy:     Cervical: No cervical adenopathy.  Skin:    General: Skin is warm and dry.     Findings: No rash.   Neurological:     Mental Status: He is alert and oriented to person, place, and time.     ED Results / Procedures / Treatments   Labs (all labs ordered are listed, but only abnormal results are displayed) Labs Reviewed - No data to display  EKG None  Radiology No results found.  Procedures .Marland KitchenIncision and Drainage  Date/Time: 07/22/2019 6:06 PM Performed by: Malvin Johns, MD Authorized by: Malvin Johns, MD   Consent:    Consent obtained:  Verbal   Consent given by:  Patient   Risks discussed:  Incomplete drainage, infection and pain   Alternatives discussed:  No treatment Location:    Type:  Seroma   Size:  4 cm   Location:  Neck   Neck location:  R anterior Pre-procedure details:    Skin preparation:  Betadine Anesthesia (see MAR for exact dosages):    Anesthesia method:  Local infiltration   Local anesthetic:  Lidocaine 1% w/o epi Procedure type:    Complexity:  Simple Procedure details:    Needle aspiration: yes     Needle size:  22 G   Drainage:  Serous   Drainage amount:  Moderate   Packing materials:  None Post-procedure details:    Patient tolerance of procedure:  Tolerated well, no immediate complications   (including critical care time)  Medications Ordered in ED Medications  lidocaine (PF) (XYLOCAINE) 1 % injection (has no administration in time range)    ED Course  I have reviewed the triage vital signs and the nursing notes.  Pertinent labs & imaging results that were available during my care of the patient were reviewed by me and considered in my medical decision making (see chart for details).    MDM Rules/Calculators/A&P                      Patient presents with a fluid collection in his neck after recent thyroid tumor and lymph node dissection.  He had a drainage of the  seroma via syringe from his ENT in [place] about a week ago.  He has had a recurrence of the fluid collection.  I spoke with Dr. Carolan Clines who is on-call doctor  at [facility].  He at this point did not recommend any imaging of the neck.  There is no suggestions of infection of the area.  It appears to be fairly superficial.  Dr. Carolan Clines felt that it would be appropriate if we tried to drain it again in the ED although sometimes the fluid will continue to reaccumulate.  We felt was appropriate to try to drain it today and have him follow-up with his ENT doctor on Monday.  I was able to get about 20 cc of nonpurulent, serous type material.  He felt much better after the procedure.  Sterile technique was used.  A dressing was applied.  He will call Monday to follow-up with his ENT.  Return precautions were given.  He just finished a course of Augmentin yesterday.  I did not feel at this point that we needed to restart him on antibiotics. Final Clinical Impression(s) / ED Diagnoses Final diagnoses:  Postoperative seroma of subcutaneous tissue after non-dermatologic procedure    Rx / DC Orders ED Discharge Orders    None       Malvin Johns, MD 07/22/19 7902

## 2019-07-22 NOTE — ED Triage Notes (Signed)
Pt had tumor reduction surgery to neck area on 5/3. Reports swelling to area. He had fluid removed last week and the swelling has returned.

## 2019-07-22 NOTE — Discharge Instructions (Addendum)
Follow-up with your ENT doctor on Monday.  Return here if you have worsening symptoms including worsening swelling, redness, drainage, fevers or other worsening symptoms.

## 2019-07-25 ENCOUNTER — Other Ambulatory Visit: Payer: Self-pay | Admitting: Cardiology

## 2019-07-25 DIAGNOSIS — C73 Malignant neoplasm of thyroid gland: Secondary | ICD-10-CM | POA: Diagnosis not present

## 2019-07-25 DIAGNOSIS — I4949 Other premature depolarization: Secondary | ICD-10-CM

## 2019-07-25 DIAGNOSIS — R0789 Other chest pain: Secondary | ICD-10-CM

## 2019-07-25 DIAGNOSIS — R0600 Dyspnea, unspecified: Secondary | ICD-10-CM

## 2019-07-25 DIAGNOSIS — R0609 Other forms of dyspnea: Secondary | ICD-10-CM

## 2019-07-25 DIAGNOSIS — E041 Nontoxic single thyroid nodule: Secondary | ICD-10-CM

## 2019-07-27 ENCOUNTER — Other Ambulatory Visit: Payer: Self-pay | Admitting: *Deleted

## 2019-07-27 ENCOUNTER — Other Ambulatory Visit: Payer: Self-pay

## 2019-07-27 ENCOUNTER — Ambulatory Visit (HOSPITAL_BASED_OUTPATIENT_CLINIC_OR_DEPARTMENT_OTHER)
Admission: RE | Admit: 2019-07-27 | Discharge: 2019-07-27 | Disposition: A | Discharge status: Home / Self Care | Payer: 59 | Source: Ambulatory Visit | Attending: Hematology & Oncology | Admitting: Hematology & Oncology

## 2019-07-27 ENCOUNTER — Other Ambulatory Visit: Payer: Self-pay | Admitting: Hematology & Oncology

## 2019-07-27 ENCOUNTER — Encounter: Payer: Self-pay | Admitting: *Deleted

## 2019-07-27 DIAGNOSIS — C73 Malignant neoplasm of thyroid gland: Secondary | ICD-10-CM

## 2019-07-27 DIAGNOSIS — C601 Malignant neoplasm of glans penis: Secondary | ICD-10-CM | POA: Diagnosis not present

## 2019-07-27 DIAGNOSIS — C7989 Secondary malignant neoplasm of other specified sites: Secondary | ICD-10-CM | POA: Diagnosis not present

## 2019-07-27 MED ORDER — IOHEXOL 300 MG/ML  SOLN
100.0000 mL | Freq: Once | INTRAMUSCULAR | Status: AC | PRN
  Administered 2019-07-27: 100 mL via INTRAVENOUS

## 2019-08-01 ENCOUNTER — Other Ambulatory Visit: Payer: Self-pay | Admitting: *Deleted

## 2019-08-01 ENCOUNTER — Encounter: Payer: Self-pay | Admitting: *Deleted

## 2019-08-01 ENCOUNTER — Other Ambulatory Visit: Payer: Self-pay | Admitting: Hematology & Oncology

## 2019-08-01 ENCOUNTER — Telehealth: Payer: Self-pay | Admitting: *Deleted

## 2019-08-01 DIAGNOSIS — C73 Malignant neoplasm of thyroid gland: Secondary | ICD-10-CM

## 2019-08-01 NOTE — Telephone Encounter (Signed)
Dr. Malena Peer office called per order of Dr. Marin Olp to make appt for pt to see Dr. Crisoforo Oxford next week per order of Dr. Crisoforo Oxford.  Appt made and patient notified.

## 2019-08-05 ENCOUNTER — Ambulatory Visit (HOSPITAL_BASED_OUTPATIENT_CLINIC_OR_DEPARTMENT_OTHER): Payer: 59

## 2019-08-05 ENCOUNTER — Ambulatory Visit (HOSPITAL_COMMUNITY)
Admission: RE | Admit: 2019-08-05 | Discharge: 2019-08-05 | Disposition: A | Discharge status: Home / Self Care | Payer: 59 | Source: Ambulatory Visit | Attending: Hematology & Oncology | Admitting: Hematology & Oncology

## 2019-08-05 ENCOUNTER — Other Ambulatory Visit: Payer: Self-pay

## 2019-08-05 DIAGNOSIS — C73 Malignant neoplasm of thyroid gland: Secondary | ICD-10-CM | POA: Insufficient documentation

## 2019-08-05 MED ORDER — GADOBUTROL 1 MMOL/ML IV SOLN: 10.0000 mL | Freq: Once | INTRAVENOUS | Status: DC | PRN

## 2019-08-05 MED ORDER — GADOBUTROL 1 MMOL/ML IV SOLN
10.0000 mL | Freq: Once | INTRAVENOUS | Status: AC | PRN
  Administered 2019-08-05: 10 mL via INTRAVENOUS

## 2019-08-07 ENCOUNTER — Other Ambulatory Visit: Payer: Self-pay | Admitting: Hematology & Oncology

## 2019-08-07 MED ORDER — OXYCODONE HCL 10 MG PO TABS: 5.0000 mg | ORAL_TABLET | Freq: Four times a day (QID) | ORAL | 0 refills | Status: DC | PRN

## 2019-08-07 MED ORDER — GAVRETO 100 MG PO CAPS: 400.0000 mg | ORAL_CAPSULE | Freq: Every day | ORAL | 5 refills | Status: DC

## 2019-08-07 MED ORDER — OXYCODONE HCL ER 20 MG PO T12A: 20.0000 mg | EXTENDED_RELEASE_TABLET | Freq: Two times a day (BID) | ORAL | 0 refills | Status: DC

## 2019-08-08 DIAGNOSIS — C787 Secondary malignant neoplasm of liver and intrahepatic bile duct: Secondary | ICD-10-CM | POA: Diagnosis not present

## 2019-08-13 DIAGNOSIS — C787 Secondary malignant neoplasm of liver and intrahepatic bile duct: Secondary | ICD-10-CM | POA: Insufficient documentation

## 2019-08-14 ENCOUNTER — Inpatient Hospital Stay (HOSPITAL_BASED_OUTPATIENT_CLINIC_OR_DEPARTMENT_OTHER): Payer: 59 | Admitting: Hematology & Oncology

## 2019-08-14 ENCOUNTER — Telehealth: Payer: Self-pay | Admitting: Hematology & Oncology

## 2019-08-14 ENCOUNTER — Other Ambulatory Visit: Payer: Self-pay | Admitting: *Deleted

## 2019-08-14 ENCOUNTER — Inpatient Hospital Stay: Payer: 59 | Attending: Hematology & Oncology

## 2019-08-14 ENCOUNTER — Other Ambulatory Visit: Payer: Self-pay

## 2019-08-14 ENCOUNTER — Encounter: Payer: Self-pay | Admitting: *Deleted

## 2019-08-14 DIAGNOSIS — D696 Thrombocytopenia, unspecified: Secondary | ICD-10-CM

## 2019-08-14 DIAGNOSIS — R0600 Dyspnea, unspecified: Secondary | ICD-10-CM | POA: Diagnosis not present

## 2019-08-14 DIAGNOSIS — C787 Secondary malignant neoplasm of liver and intrahepatic bile duct: Secondary | ICD-10-CM | POA: Diagnosis not present

## 2019-08-14 DIAGNOSIS — R0609 Other forms of dyspnea: Secondary | ICD-10-CM

## 2019-08-14 DIAGNOSIS — M130 Polyarthritis, unspecified: Secondary | ICD-10-CM

## 2019-08-14 DIAGNOSIS — Z79899 Other long term (current) drug therapy: Secondary | ICD-10-CM | POA: Diagnosis not present

## 2019-08-14 DIAGNOSIS — Z7952 Long term (current) use of systemic steroids: Secondary | ICD-10-CM | POA: Diagnosis not present

## 2019-08-14 DIAGNOSIS — R809 Proteinuria, unspecified: Secondary | ICD-10-CM

## 2019-08-14 DIAGNOSIS — C73 Malignant neoplasm of thyroid gland: Secondary | ICD-10-CM | POA: Diagnosis not present

## 2019-08-14 DIAGNOSIS — R519 Headache, unspecified: Secondary | ICD-10-CM

## 2019-08-14 DIAGNOSIS — I10 Essential (primary) hypertension: Secondary | ICD-10-CM

## 2019-08-14 DIAGNOSIS — R600 Localized edema: Secondary | ICD-10-CM | POA: Diagnosis not present

## 2019-08-14 LAB — CBC WITH DIFFERENTIAL (CANCER CENTER ONLY)
Abs Immature Granulocytes: 0.02 10*3/uL (ref 0.00–0.07)
Basophils Absolute: 0 10*3/uL (ref 0.0–0.1)
Basophils Relative: 1 %
Eosinophils Absolute: 0.1 10*3/uL (ref 0.0–0.5)
Eosinophils Relative: 2 %
HCT: 36.8 % — ABNORMAL LOW (ref 39.0–52.0)
Hemoglobin: 12.6 g/dL — ABNORMAL LOW (ref 13.0–17.0)
Immature Granulocytes: 1 %
Lymphocytes Relative: 30 %
Lymphs Abs: 1.1 10*3/uL (ref 0.7–4.0)
MCH: 32 pg (ref 26.0–34.0)
MCHC: 34.2 g/dL (ref 30.0–36.0)
MCV: 93.4 fL (ref 80.0–100.0)
Monocytes Absolute: 0.3 10*3/uL (ref 0.1–1.0)
Monocytes Relative: 7 %
Neutro Abs: 2.3 10*3/uL (ref 1.7–7.7)
Neutrophils Relative %: 59 %
Platelet Count: 174 10*3/uL (ref 150–400)
RBC: 3.94 MIL/uL — ABNORMAL LOW (ref 4.22–5.81)
RDW: 14 % (ref 11.5–15.5)
WBC Count: 3.7 10*3/uL — ABNORMAL LOW (ref 4.0–10.5)
nRBC: 0 % (ref 0.0–0.2)

## 2019-08-14 LAB — LIPID PANEL
Cholesterol: 205 mg/dL — ABNORMAL HIGH (ref 0–200)
HDL: 27 mg/dL — ABNORMAL LOW (ref >40)
LDL Cholesterol: 116 mg/dL — ABNORMAL HIGH (ref 0–99)
Total CHOL/HDL Ratio: 7.6 RATIO
Triglycerides: 309 mg/dL — ABNORMAL HIGH (ref <150)
VLDL: 62 mg/dL — ABNORMAL HIGH (ref 0–40)

## 2019-08-14 LAB — URINALYSIS, COMPLETE (UACMP) WITH MICROSCOPIC
Bilirubin Urine: NEGATIVE
Glucose, UA: NEGATIVE mg/dL
Hgb urine dipstick: NEGATIVE
Ketones, ur: NEGATIVE mg/dL
Leukocytes,Ua: NEGATIVE
Nitrite: NEGATIVE
Protein, ur: 100 mg/dL — AB
Specific Gravity, Urine: >1.03 — ABNORMAL HIGH (ref 1.005–1.030)
pH: 6 (ref 5.0–8.0)

## 2019-08-14 LAB — CMP (CANCER CENTER ONLY)
ALT: 30 U/L (ref 0–44)
AST: 19 U/L (ref 15–41)
Albumin: 3.7 g/dL (ref 3.5–5.0)
Alkaline Phosphatase: 61 U/L (ref 38–126)
Anion gap: 4 — ABNORMAL LOW (ref 5–15)
BUN: 15 mg/dL (ref 6–20)
CO2: 31 mmol/L (ref 22–32)
Calcium: 8.7 mg/dL — ABNORMAL LOW (ref 8.9–10.3)
Chloride: 102 mmol/L (ref 98–111)
Creatinine: 0.77 mg/dL (ref 0.61–1.24)
GFR, Est AFR Am: >60 mL/min (ref >60)
GFR, Estimated: >60 mL/min (ref >60)
Glucose, Bld: 139 mg/dL — ABNORMAL HIGH (ref 70–99)
Potassium: 4.2 mmol/L (ref 3.5–5.1)
Sodium: 137 mmol/L (ref 135–145)
Total Bilirubin: 0.5 mg/dL (ref 0.3–1.2)
Total Protein: 5.6 g/dL — ABNORMAL LOW (ref 6.5–8.1)

## 2019-08-14 LAB — TSH: TSH: 0.391 u[IU]/mL (ref 0.320–4.118)

## 2019-08-14 NOTE — Progress Notes (Signed)
Hematology and Oncology Follow Up Visit  Brian [name] [id number] 06-17-1975 44 y.o. 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 Unfortunately, he now clearly has progressive disease. We did do a MRI of the liver. This did show some progression in the liver.  We sent him over to [facility] to see Dr Brian [name], the top-notch surgical oncologist. Dr. Vernard [name] wants to see the actual MRIs to see if there is any indication for liver resection.  If there is no surgery for Brian [name], we will try to have him Brian [name], the new RET inhibitor. 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 I am sure that insurance will probably try to deny this.  He actually is doing quite well. He had a good weekend. He has had no problems with cough or shortness of breath. He has had little bit abdominal pain. We increase the oxycodone to 10 mg. This seems to be helping him. He is still able to work which is very important for him.  He has had no problems with bowels or bladder. He has had no nausea or vomiting. Is had no leg swelling. There is been no rashes. Has had no headache despite the fact that he hit a door while trying to avoid a horse fly. Thankfully, he did not sustain any kind of cut or pass out.  Currently, his performance status is ECOG 1.        Medications:  Current Outpatient Medications:  .  cabozantinib (CABOMETYX) 40 MG tablet, 40 mg., Disp: , Rfl:  .  cloNIDine (CATAPRES) 0.1 MG tablet, Take 1 tablet (0.1 mg total) by mouth 2 (two) times daily., Disp: 60 tablet, Rfl: 11 .  cyclobenzaprine (FLEXERIL) 10 MG tablet, Take 1 tablet (10 mg total) by mouth 3 (three) times  daily as needed for muscle spasms., Disp: 30 tablet, Rfl: 3 .  EPINEPHrine (EPIPEN 2-PAK) 0.3 mg/0.3 mL IJ SOAJ injection, USE AS DIRECTED FOR LIFE THREATENING ALLERGIC REACTIONS (Patient not taking: Reported on 06/07/2019), Disp: 4 Device, Rfl: 3 .  furosemide (LASIX) 80 MG tablet, Take 1 tablet (80 mg total) by mouth daily. (Patient taking differently: Take 80 mg by mouth daily as needed. ), Disp: 30 tablet, Rfl: 1 .  gabapentin (NEURONTIN) 300 MG capsule, Take 1 capsule (300 mg total) by mouth 3 (three) times daily., Disp: 90 capsule, Rfl: 2 .  isosorbide mononitrate (IMDUR) 60 MG 24 hr tablet, Take 1 tablet (60 mg total) by mouth daily., Disp: 90 tablet, Rfl: 3 .  levothyroxine (SYNTHROID) 125 MCG tablet, Take 250 mcg by mouth daily., Disp: , Rfl:  .  levothyroxine (SYNTHROID) 200 MCG tablet, Take 1 tablet (200 mcg total) by mouth daily before breakfast., Disp: 90 tablet, Rfl: 3 .  levothyroxine (SYNTHROID) 75 MCG tablet, Take 75 mcg by mouth daily., Disp: , Rfl:  .  lidocaine (XYLOCAINE) 2 % solution, Use as directed in the mouth or throat as needed. , Disp: , Rfl:  .  losartan (COZAAR) 25 MG tablet, Take 1 tablet (25 mg total) by mouth 2 (two) times daily., Disp:  60 tablet, Rfl: 5 .  methylphenidate (RITALIN) 10 MG tablet, Take 1 tablet (10 mg total) by mouth 2 (two) times daily., Disp: 60 tablet, Rfl: 0 .  metoCLOPramide (REGLAN) 10 MG tablet, TAKE 1 TABLET BY MOUTH FOUR TIMES DAILY BEFORE MEALS AND AT BEDTIME, Disp: 120 tablet, Rfl: 0 .  metolazone (ZAROXOLYN) 5 MG tablet, Take 1 tablet (5 mg total) by mouth daily. Take 1 hour before Lasix., Disp: 30 tablet, Rfl: 1 .  Multiple Vitamin (MULTIVITAMIN) capsule, Take 1 capsule by mouth daily., Disp: , Rfl:  .  ondansetron (ZOFRAN) 8 MG tablet, Take 8 mg by mouth every 8 (eight) hours as needed for nausea or vomiting. , Disp: , Rfl:  .  oxyCODONE (OXYCONTIN) 20 mg 12 hr tablet, Take 1 tablet (20 mg total) by mouth every 12 (twelve) hours., Disp: 60  tablet, Rfl: 0 .  oxyCODONE 10 MG TABS, Take 0.5 tablets (5 mg total) by mouth every 6 (six) hours as needed for severe pain., Disp: 60 tablet, Rfl: 0 .  potassium chloride (MICRO-K) 10 MEQ CR capsule, Take by mouth., Disp: , Rfl:  .  potassium chloride SA (KLOR-CON) 10 MEQ tablet, Take 1 tablet (10 mEq total) by mouth daily., Disp: 30 tablet, Rfl: 1 .  Pralsetinib (GAVRETO) 100 MG CAPS, Take 400 mg by mouth daily., Disp: 120 capsule, Rfl: 5 .  predniSONE (DELTASONE) 20 MG tablet, Take 1 tablet (20 mg total) by mouth daily with breakfast., Disp: 30 tablet, Rfl: 4 .  predniSONE (DELTASONE) 50 MG tablet, 13 HOUR PREP - TAKE 1 TABLET BY MOUTH 13 HOURS,7 HOURS,AND 1 HOUR PRIOR TO SCAN;THE SCAN SHOULD BE PERFORMED WITHIN 6 HOURS OF LAST DOSE, Disp: , Rfl:  .  promethazine (PHENERGAN) 12.5 MG tablet, Take 12.5 mg by mouth at bedtime., Disp: , Rfl:  .  promethazine (PHENERGAN) 25 MG tablet, TAKE 1 TABLET BY MOUTH EVERY 6 HOURS AS NEEDED FOR NAUSEA & VOMITING, Disp: 120 tablet, Rfl: 0  Allergies:  Allergies  Allergen Reactions  . Dextromethorphan-Guaifenesin Other (See Comments)    Irregular heartbeat   . Doxycycline Anaphylaxis, Nausea And Vomiting, Rash and Other (See Comments)    "heart arrythmia" and "dyspepsia" (only oral doxycycline causes reaction)  . Gadobutrol Hives and Other (See Comments)    Patient had MRI scan at [place]. Patient called one hour after he left imaging facility to report two "blisters" that came up on "back" of lip.    . Gadolinium Derivatives Hives and Other (See Comments)    Patient had MRI scan at [place]. Patient called one hour after he left imaging facility to report two "blisters" that came up on "back" of lip.   . Guaifenesin Palpitations       . Ibuprofen Hives and Itching  . Lisinopril Other (See Comments)    Angioedema  . Pantoprazole Itching  . Tramadol Hives and Itching  . Barium Rash    Developed redness around neck after drinking  1st bottle of Barocat; pt was given Benedryl by ED Mds to be "on the safe side" before drinking 2nd bottle    Past Medical History, Surgical history, Social history, and Family History were reviewed and updated.  Review of Systems: Review of Systems  Constitutional: Negative.   HENT:   Positive for lump/mass and sore throat.   Eyes: Negative.   Respiratory: Negative.   Cardiovascular: Negative.   Gastrointestinal: Negative.   Endocrine: Negative.   Genitourinary: Negative.    Musculoskeletal: Negative.  Skin: Negative.   Neurological: Negative.   Hematological: Negative.   Psychiatric/Behavioral: Negative.     Physical Exam:  weight is 272 lb 4 oz (123.5 kg). His temporal temperature is 96.6 F (35.9 C) (abnormal). His blood pressure is 126/85 and his pulse is 75. His respiration is 18 and oxygen saturation is 100%.   Wt Readings from Last 3 Encounters:  08/14/19 272 lb 4 oz (123.5 kg)  07/10/19 270 lb (122.5 kg)  06/07/19 263 lb 6.4 oz (119.5 kg)    Physical Exam Vitals reviewed.  HENT:     Head: Normocephalic and atraumatic.  Eyes:     Pupils: Pupils are equal, round, and reactive to light.  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  This is well-healing.  There is some slight fullness but no distinct mass noted in the neck. Cardiovascular:     Rate and Rhythm: Normal rate and regular rhythm.     Heart sounds: Normal heart sounds.  Pulmonary:     Effort: Pulmonary effort is normal.     Breath sounds: Normal breath sounds.  Abdominal:     General: Bowel sounds are normal.     Palpations: Abdomen is soft.  Musculoskeletal:        General: Swelling present. No tenderness or deformity. Normal range of motion.     Cervical back: Normal range of motion.  Lymphadenopathy:     Cervical: No cervical adenopathy.  Skin:    General: Skin is warm and dry.     Findings: No erythema or rash.  Neurological:      Mental Status: He is alert and oriented to person, place, and time.  Psychiatric:        Behavior: Behavior normal.        Thought Content: Thought content normal.        Judgment: Judgment normal.      Lab Results  Component Value Date   WBC 3.7 (L) 08/14/2019   HGB 12.6 (L) 08/14/2019   HCT 36.8 (L) 08/14/2019   MCV 93.4 08/14/2019   PLT 174 08/14/2019     Chemistry      Component Value Date/Time   NA 137 08/14/2019 0810   NA 141 02/06/2019 1210   K 4.2 08/14/2019 0810   CL 102 08/14/2019 0810   CO2 31 08/14/2019 0810   BUN 15 08/14/2019 0810   BUN 8 02/06/2019 1210   CREATININE 0.77 08/14/2019 0810      Component Value Date/Time   CALCIUM 8.7 (L) 08/14/2019 0810   ALKPHOS 61 08/14/2019 0810   AST 19 08/14/2019 0810   ALT 30 08/14/2019 0810   BILITOT 0.5 08/14/2019 0810      Impression and Plan: Brian [name] is a 44 year old white male.  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  We will see if any surgery can be done for him. It would be nice if that could be done. Again, our options are just not that great for him. Maybe, the Brian [name] will be approved so we can try that.  This is really all about quality of life. I know for Brian [name], he really wants to have quality of life also.  I will plan to get him back in another 3 to 4 weeks. Again a lot will be dependent on whether or not he can have surgery. Really hope he will be able to.  [name], MD 6/28/20219:26 AM

## 2019-08-14 NOTE — Telephone Encounter (Signed)
Appointments scheduled calendar declined.  Patient to get updates from My Chart per 6/28 los

## 2019-08-15 ENCOUNTER — Telehealth: Payer: Self-pay | Admitting: *Deleted

## 2019-08-15 NOTE — Telephone Encounter (Signed)
Message left from patient to notify Dr. Marin Olp that Dr. Crisoforo Oxford will be operating on his liver on 09/11/19.  Dr. Marin Olp notified.

## 2019-08-16 ENCOUNTER — Other Ambulatory Visit: Payer: Self-pay | Admitting: *Deleted

## 2019-08-16 ENCOUNTER — Encounter: Payer: Self-pay | Admitting: *Deleted

## 2019-08-16 NOTE — Progress Notes (Signed)
Peer to peer done this morning with pt.'s insurance for [facility] per Dr. Marin Olp.  Gavreto denied per pt.'s insurance.  Pt notified.

## 2019-08-18 ENCOUNTER — Ambulatory Visit: Payer: 59 | Admitting: Hematology & Oncology

## 2019-08-18 ENCOUNTER — Other Ambulatory Visit: Payer: 59

## 2019-08-29 ENCOUNTER — Telehealth: Payer: Self-pay

## 2019-08-29 DIAGNOSIS — C73 Malignant neoplasm of thyroid gland: Secondary | ICD-10-CM | POA: Diagnosis not present

## 2019-08-29 DIAGNOSIS — R809 Proteinuria, unspecified: Secondary | ICD-10-CM | POA: Diagnosis not present

## 2019-08-29 DIAGNOSIS — G4762 Sleep related leg cramps: Secondary | ICD-10-CM | POA: Diagnosis not present

## 2019-08-29 DIAGNOSIS — C787 Secondary malignant neoplasm of liver and intrahepatic bile duct: Secondary | ICD-10-CM | POA: Diagnosis not present

## 2019-08-29 DIAGNOSIS — I10 Essential (primary) hypertension: Secondary | ICD-10-CM | POA: Diagnosis not present

## 2019-08-29 NOTE — Telephone Encounter (Signed)
Received call from pt stating that he is having surgery at [facility] on 7/26, therefore he needs to cancel his appts here on 7/27. Pt will contact office to r/s after procedure. dph

## 2019-09-01 DIAGNOSIS — C787 Secondary malignant neoplasm of liver and intrahepatic bile duct: Secondary | ICD-10-CM | POA: Diagnosis not present

## 2019-09-04 ENCOUNTER — Telehealth: Payer: Self-pay | Admitting: Pharmacy Technician

## 2019-09-04 ENCOUNTER — Encounter: Payer: Self-pay | Admitting: Hematology & Oncology

## 2019-09-04 NOTE — Telephone Encounter (Signed)
Oral Oncology Patient Advocate Encounter  Coordinated with patient over the phone to complete application for [place] in an effort to reduce patient's out of pocket expense for Gavreto to $0.    Application completed and faxed to [phone].   [employer] patient foundation phone number for follow up is [phone].   This encounter will be updated until final determination.   [place] Patient [place] Phone [phone] Fax [phone] 09/04/2019 3:54 PM

## 2019-09-05 DIAGNOSIS — E890 Postprocedural hypothyroidism: Secondary | ICD-10-CM | POA: Diagnosis not present

## 2019-09-05 DIAGNOSIS — C73 Malignant neoplasm of thyroid gland: Secondary | ICD-10-CM | POA: Diagnosis not present

## 2019-09-06 ENCOUNTER — Encounter: Payer: Self-pay | Admitting: Hematology & Oncology

## 2019-09-06 ENCOUNTER — Other Ambulatory Visit: Payer: Self-pay

## 2019-09-06 ENCOUNTER — Inpatient Hospital Stay: Payer: 59 | Attending: Hematology & Oncology

## 2019-09-06 ENCOUNTER — Inpatient Hospital Stay: Payer: 59

## 2019-09-06 ENCOUNTER — Inpatient Hospital Stay (HOSPITAL_BASED_OUTPATIENT_CLINIC_OR_DEPARTMENT_OTHER): Payer: 59 | Admitting: Hematology & Oncology

## 2019-09-06 VITALS — BP 121/77 | HR 100 | Temp 99.7°F | Resp 18 | Wt 269.0 lb

## 2019-09-06 DIAGNOSIS — Z5189 Encounter for other specified aftercare: Secondary | ICD-10-CM | POA: Diagnosis not present

## 2019-09-06 DIAGNOSIS — Z5111 Encounter for antineoplastic chemotherapy: Secondary | ICD-10-CM | POA: Insufficient documentation

## 2019-09-06 DIAGNOSIS — C787 Secondary malignant neoplasm of liver and intrahepatic bile duct: Secondary | ICD-10-CM | POA: Insufficient documentation

## 2019-09-06 DIAGNOSIS — C73 Malignant neoplasm of thyroid gland: Secondary | ICD-10-CM | POA: Insufficient documentation

## 2019-09-06 LAB — CBC WITH DIFFERENTIAL (CANCER CENTER ONLY)
Abs Immature Granulocytes: 0.01 10*3/uL (ref 0.00–0.07)
Basophils Absolute: 0 10*3/uL (ref 0.0–0.1)
Basophils Relative: 0 %
Eosinophils Absolute: 0.1 10*3/uL (ref 0.0–0.5)
Eosinophils Relative: 2 %
HCT: 39.3 % (ref 39.0–52.0)
Hemoglobin: 13.3 g/dL (ref 13.0–17.0)
Immature Granulocytes: 0 %
Lymphocytes Relative: 24 %
Lymphs Abs: 1.1 10*3/uL (ref 0.7–4.0)
MCH: 32 pg (ref 26.0–34.0)
MCHC: 33.8 g/dL (ref 30.0–36.0)
MCV: 94.7 fL (ref 80.0–100.0)
Monocytes Absolute: 0.5 10*3/uL (ref 0.1–1.0)
Monocytes Relative: 11 %
Neutro Abs: 2.8 10*3/uL (ref 1.7–7.7)
Neutrophils Relative %: 63 %
Platelet Count: 204 10*3/uL (ref 150–400)
RBC: 4.15 MIL/uL — ABNORMAL LOW (ref 4.22–5.81)
RDW: 14.3 % (ref 11.5–15.5)
WBC Count: 4.5 10*3/uL (ref 4.0–10.5)
nRBC: 0 % (ref 0.0–0.2)

## 2019-09-06 LAB — CMP (CANCER CENTER ONLY)
ALT: 25 U/L (ref 0–44)
AST: 28 U/L (ref 15–41)
Albumin: 4 g/dL (ref 3.5–5.0)
Alkaline Phosphatase: 74 U/L (ref 38–126)
Anion gap: 4 — ABNORMAL LOW (ref 5–15)
BUN: 13 mg/dL (ref 6–20)
CO2: 33 mmol/L — ABNORMAL HIGH (ref 22–32)
Calcium: 10.6 mg/dL — ABNORMAL HIGH (ref 8.9–10.3)
Chloride: 100 mmol/L (ref 98–111)
Creatinine: 0.9 mg/dL (ref 0.61–1.24)
GFR, Est AFR Am: >60 mL/min (ref >60)
GFR, Estimated: >60 mL/min (ref >60)
Glucose, Bld: 97 mg/dL (ref 70–99)
Potassium: 4.9 mmol/L (ref 3.5–5.1)
Sodium: 137 mmol/L (ref 135–145)
Total Bilirubin: 0.7 mg/dL (ref 0.3–1.2)
Total Protein: 6.3 g/dL — ABNORMAL LOW (ref 6.5–8.1)

## 2019-09-06 LAB — URINALYSIS, COMPLETE (UACMP) WITH MICROSCOPIC
Bilirubin Urine: NEGATIVE
Glucose, UA: NEGATIVE mg/dL
Hgb urine dipstick: NEGATIVE
Ketones, ur: NEGATIVE mg/dL
Leukocytes,Ua: NEGATIVE
Nitrite: NEGATIVE
Protein, ur: 100 mg/dL — AB
Specific Gravity, Urine: >1.03 — ABNORMAL HIGH (ref 1.005–1.030)
pH: 7.5 (ref 5.0–8.0)

## 2019-09-06 LAB — LACTATE DEHYDROGENASE: LDH: 274 U/L — ABNORMAL HIGH (ref 98–192)

## 2019-09-06 MED ORDER — LEVOFLOXACIN 500 MG PO TABS
500.0000 mg | ORAL_TABLET | Freq: Every day | ORAL | 6 refills | Status: DC
Start: 2019-09-06 — End: 2020-01-13

## 2019-09-06 MED ORDER — DEXAMETHASONE 4 MG PO TABS
4.0000 mg | ORAL_TABLET | Freq: Two times a day (BID) | ORAL | 1 refills | Status: DC
Start: 2019-09-06 — End: 2019-11-13

## 2019-09-06 MED ORDER — SODIUM CHLORIDE 0.9 % IV SOLN
INTRAVENOUS | Status: AC
  Filled 2019-09-06: qty 250

## 2019-09-06 NOTE — Progress Notes (Signed)
Hematology and Oncology Follow Up Visit  [name] [id number] 1975/12/07 44 y.o. 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 is back for for follow-up. Unfortunately, for right now, there is no surgical option that we have for him.  He saw Dr. Jyl Heinz at [facility].  Dr. Crisoforo Oxford looked at his MRI.  There does seem to be a little bit too much disease in the liver.  He had disease in both the right and left lobe of the liver.  Dr. Crisoforo Oxford thought that this would require a significant partial hepatectomy which might not even be effective.  Dr. Crisoforo Oxford thought that if we could possibly decrease his tumor burden, then there might be a role for surgery.  We cannot get insurance to approve the [place].  He does have the RET mutation associated with his cancer.  Again, insurance just is not going to approve this.  I think the next option is going to have to be chemotherapy.  I realize that chemotherapy does not have the greatest "track record" with metastatic thyroid cancer.  However, I do still see any other option.  [name] still has a good performance status (ECOG 1), he is young, and he has a lot of good support.  He is having more pain over on the right side.  I am sure this is from his hepatic lesions.  He is on OxyContin and oxycodone.  This is helped quite a bit.  It is allowed to be functional so we can still work.  I think a decent option would be Taxotere/cisplatin.  Again, he is young so he would be able to tolerate the cytopenias that can happen with this protocol.  I think response rates probably would be less than 28% but there certainly could be a response.  He will need to have a  Port-A-Cath placed.  We will try to get all this arranged for next week.  Currently, he is not having any problems with appetite.  He is not having any leg swelling.  There is no issues with bowels or bladder.  He probably does need some IV fluids.  He says IV fluids does make him feel better.    Currently, his performance status is ECOG 1.        Medications:  Current Outpatient Medications:  .  acetaminophen (TYLENOL) 500 MG tablet, Take by mouth., Disp: , Rfl:  .  BANOPHEN 25 MG capsule, Take 50 mg by mouth daily., Disp: , Rfl:  .  cabozantinib (CABOMETYX) 40 MG tablet, 40 mg., Disp: , Rfl:  .  cloNIDine (CATAPRES) 0.2 MG tablet, Take 0.2 mg by mouth., Disp: , Rfl:  .  cyclobenzaprine (FLEXERIL) 10 MG tablet, Take 1 tablet (10 mg total) by mouth 3 (three) times daily as needed for muscle spasms., Disp: 30 tablet, Rfl: 3 .  EPINEPHrine (EPIPEN 2-PAK) 0.3 mg/0.3 mL IJ SOAJ injection, USE AS DIRECTED FOR LIFE THREATENING ALLERGIC REACTIONS (Patient not taking: Reported on 06/07/2019), Disp: 4 Device, Rfl: 3 .  furosemide (LASIX) 80 MG tablet, Take 1 tablet (80 mg total) by mouth daily. (Patient taking differently: Take  80 mg by mouth daily as needed. ), Disp: 30 tablet, Rfl: 1 .  gabapentin (NEURONTIN) 300 MG capsule, Take 1 capsule (300 mg total) by mouth 3 (three) times daily., Disp: 90 capsule, Rfl: 2 .  isosorbide mononitrate (IMDUR) 60 MG 24 hr tablet, Take 1 tablet (60 mg total) by mouth daily., Disp: 90 tablet, Rfl: 3 .  levothyroxine (SYNTHROID) 125 MCG tablet, Take 250 mcg by mouth daily., Disp: , Rfl:  .  levothyroxine (SYNTHROID) 200 MCG tablet, Take 1 tablet (200 mcg total) by mouth daily before breakfast., Disp: 90 tablet, Rfl: 3 .  levothyroxine (SYNTHROID) 75 MCG tablet, Take 75 mcg by mouth daily., Disp: , Rfl:  .  lidocaine (XYLOCAINE) 2 % solution, Use as directed in the mouth or throat as needed. , Disp: , Rfl:  .  losartan (COZAAR) 25 MG tablet, Take 1 tablet (25 mg total)  by mouth 2 (two) times daily., Disp: 60 tablet, Rfl: 5 .  methylphenidate (RITALIN) 10 MG tablet, Take 1 tablet (10 mg total) by mouth 2 (two) times daily., Disp: 60 tablet, Rfl: 0 .  metoCLOPramide (REGLAN) 10 MG tablet, TAKE 1 TABLET BY MOUTH FOUR TIMES DAILY BEFORE MEALS AND AT BEDTIME, Disp: 120 tablet, Rfl: 0 .  metolazone (ZAROXOLYN) 5 MG tablet, Take 1 tablet (5 mg total) by mouth daily. Take 1 hour before Lasix., Disp: 30 tablet, Rfl: 1 .  Multiple Vitamin (MULTIVITAMIN) capsule, Take 1 capsule by mouth daily., Disp: , Rfl:  .  ondansetron (ZOFRAN) 8 MG tablet, Take 8 mg by mouth every 8 (eight) hours as needed for nausea or vomiting. , Disp: , Rfl:  .  oxyCODONE (OXYCONTIN) 20 mg 12 hr tablet, Take 1 tablet (20 mg total) by mouth every 12 (twelve) hours., Disp: 60 tablet, Rfl: 0 .  oxyCODONE 10 MG TABS, Take 0.5 tablets (5 mg total) by mouth every 6 (six) hours as needed for severe pain., Disp: 60 tablet, Rfl: 0 .  potassium chloride (MICRO-K) 10 MEQ CR capsule, Take by mouth., Disp: , Rfl:  .  potassium chloride SA (KLOR-CON) 10 MEQ tablet, Take 1 tablet (10 mEq total) by mouth daily., Disp: 30 tablet, Rfl: 1 .  predniSONE (DELTASONE) 20 MG tablet, Take 1 tablet (20 mg total) by mouth daily with breakfast., Disp: 30 tablet, Rfl: 4 .  predniSONE (DELTASONE) 50 MG tablet, 13 HOUR PREP - TAKE 1 TABLET BY MOUTH 13 HOURS,7 HOURS,AND 1 HOUR PRIOR TO SCAN;THE SCAN SHOULD BE PERFORMED WITHIN 6 HOURS OF LAST DOSE, Disp: , Rfl:  .  promethazine (PHENERGAN) 12.5 MG tablet, Take 12.5 mg by mouth at bedtime., Disp: , Rfl:  .  promethazine (PHENERGAN) 25 MG tablet, TAKE 1 TABLET BY MOUTH EVERY 6 HOURS AS NEEDED FOR NAUSEA & VOMITING, Disp: 120 tablet, Rfl: 0  Allergies:  Allergies  Allergen Reactions  . Dextromethorphan-Guaifenesin Other (See Comments)    Irregular heartbeat   . Doxycycline Anaphylaxis, Nausea And Vomiting, Rash and Other (See Comments)    "heart arrythmia" and "dyspepsia" (only  oral doxycycline causes reaction)  . Gadobutrol Hives and Other (See Comments)    Patient had MRI scan at [place]. Patient called one hour after he left imaging facility to report two "blisters" that came up on "back" of lip.    . Gadolinium Derivatives Hives and Other (See Comments)    Patient had MRI scan at [place]. Patient called one hour after he left imaging facility to report two "blisters" that came up  on "back" of lip.   . Guaifenesin Palpitations       . Ibuprofen Hives and Itching  . Lisinopril Other (See Comments)    Angioedema  . Pantoprazole Itching  . Tramadol Hives and Itching  . Barium Rash    Developed redness around neck after drinking 1st bottle of Barocat; pt was given Benedryl by ED Mds to be "on the safe side" before drinking 2nd bottle    Past Medical History, Surgical history, Social history, and Family History were reviewed and updated.  Review of Systems: Review of Systems  Constitutional: Negative.   HENT:   Positive for lump/mass and sore throat.   Eyes: Negative.   Respiratory: Negative.   Cardiovascular: Negative.   Gastrointestinal: Negative.   Endocrine: Negative.   Genitourinary: Negative.    Musculoskeletal: Negative.   Skin: Negative.   Neurological: Negative.   Hematological: Negative.   Psychiatric/Behavioral: Negative.     Physical Exam:  weight is 269 lb (122 kg). His oral temperature is 99.7 F (37.6 C). His blood pressure is 121/77 and his pulse is 100. His respiration is 18 and oxygen saturation is 96%.   Wt Readings from Last 3 Encounters:  09/06/19 269 lb (122 kg)  08/14/19 272 lb 4 oz (123.5 kg)  07/10/19 270 lb (122.5 kg)    Physical Exam Vitals reviewed.  HENT:     Head: Normocephalic and atraumatic.  Eyes:     Pupils: Pupils are equal, round, and reactive to light.  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  This is well-healing.  There  is some slight fullness but no distinct mass noted in the neck. Cardiovascular:     Rate and Rhythm: Normal rate and regular rhythm.     Heart sounds: Normal heart sounds.  Pulmonary:     Effort: Pulmonary effort is normal.     Breath sounds: Normal breath sounds.  Abdominal:     General: Bowel sounds are normal.     Palpations: Abdomen is soft.  Musculoskeletal:        General: Swelling present. No tenderness or deformity. Normal range of motion.     Cervical back: Normal range of motion.  Lymphadenopathy:     Cervical: No cervical adenopathy.  Skin:    General: Skin is warm and dry.     Findings: No erythema or rash.  Neurological:     Mental Status: He is alert and oriented to person, place, and time.  Psychiatric:        Behavior: Behavior normal.        Thought Content: Thought content normal.        Judgment: Judgment normal.      Lab Results  Component Value Date   WBC 4.5 09/06/2019   HGB 13.3 09/06/2019   HCT 39.3 09/06/2019   MCV 94.7 09/06/2019   PLT 204 09/06/2019     Chemistry      Component Value Date/Time   NA 137 09/06/2019 1156   NA 141 02/06/2019 1210   K 4.9 09/06/2019 1156   CL 100 09/06/2019 1156   CO2 33 (H) 09/06/2019 1156   BUN 13 09/06/2019 1156   BUN 8 02/06/2019 1210   CREATININE 0.90 09/06/2019 1156      Component Value Date/Time   CALCIUM 10.6 (H) 09/06/2019 1156   ALKPHOS 74 09/06/2019 1156   AST 28 09/06/2019 1156   ALT 25  09/06/2019 1156   BILITOT 0.7 09/06/2019 1156      Impression and Plan: [name] is a 44 year old white male.  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  Again, his thyroid cancer is RET mutated.  We will move ahead with chemotherapy.  Maybe if we can get a response, we will then be able to see about any kind of surgery.  I would give him 2 cycles of Taxotere/cis-platinum and then repeat his MRI.  He will need G-CSF.  I probably will also need to get him on prophylactic  antibiotics.  I went over side effects with him.  To me, the biggest side effect will be the neutropenia.  We will lose his hair.  He understands this.  We will see about getting the Port-A-Cath placed.  I will plan to get him back probably a week or so after treatment so we see how his blood counts look and see how his labs look and maybe he might need IV fluids.  I spent almost an hour with him today.     [name], MD 7/21/20211:23 PM

## 2019-09-06 NOTE — Progress Notes (Signed)
START OFF PATHWAY REGIMEN - Thyroid   [id number]:Cisplatin 75 mg/m2 IV D1 + Docetaxel 75 mg/m2 IV D1 q21 Days:   A cycle is every 21 days:     Docetaxel      Cisplatin   **Always confirm dose/schedule in your pharmacy ordering system**  Patient Characteristics: Well Differentiated Thyroid Cancer Including Papillary, Follicular, and Hurthle Cell, Recurrent Disease - Unresected or Distant Metastases, Not a Candidate for Surgery, Iodine Relapse/Refractory or Not a Candidate for Iodine Therapy, NTRK  Negative/Unknown and RET Negative/Unknown Disease Type: Well Differentiated Thyroid Cancer Including Papillary, Follicular, and Hurthle Cell Therapeutic Status: Distant Metastases Surgical Candidacy: Not a Candidate for Surgery NTRK Gene Fusion Status: Negative RET Gene Fusion Status: Negative Intent of Therapy: Non-Curative / Palliative Intent, Discussed with Patient

## 2019-09-06 NOTE — Patient Instructions (Signed)

## 2019-09-07 NOTE — Progress Notes (Signed)
Pharmacist Chemotherapy Monitoring - Initial Assessment    Anticipated start date: 09/14/19   Regimen:   Are orders appropriate based on the patients diagnosis, regimen, and cycle? Yes  Does the plan date match the patients scheduled date? Yes  Is the sequencing of drugs appropriate? Yes  Are the premedications appropriate for the patients regimen? Yes  Prior Authorization for treatment is: Not Started o If applicable, is the correct biosimilar selected based on the patient's insurance? not applicable  Organ Function and Labs:  Are dose adjustments needed based on the patient's renal function, hepatic function, or hematologic function? No  Are appropriate labs ordered prior to the start of patient's treatment? Yes  Other organ system assessment, if indicated: N/A  The following baseline labs, if indicated, have been ordered: N/A  Dose Assessment:  Are the drug doses appropriate? Yes  Are the following correct: o Drug concentrations Yes o IV fluid compatible with drug Yes o Administration routes Yes o Timing of therapy Yes  If applicable, does the patient have documented access for treatment and/or plans for port-a-cath placement? not applicable  If applicable, have lifetime cumulative doses been properly documented and assessed? not applicable Lifetime Dose Tracking  No doses have been documented on this patient for the following tracked chemicals: Doxorubicin, Epirubicin, Idarubicin, Daunorubicin, Mitoxantrone, Bleomycin, Oxaliplatin, Carboplatin, Liposomal Doxorubicin  o   Toxicity Monitoring/Prevention:  The patient has the following take home antiemetics prescribed: Ondansetron, Prochlorperazine, Dexamethasone and Lorazepam  The patient has the following take home medications prescribed: N/A  Medication allergies and previous infusion related reactions, if applicable, have been reviewed and addressed. Yes  The patient's current medication list has been  assessed for drug-drug interactions with their chemotherapy regimen. no significant drug-drug interactions were identified on review.  Order Review:  Are the treatment plan orders signed? Yes  Is the patient scheduled to see a provider prior to their treatment? No  I verify that I have reviewed each item in the above checklist and answered each question accordingly.  Domanick Cuccia, [name] 09/07/2019 2:35 PM

## 2019-09-11 NOTE — Telephone Encounter (Signed)
Oral Oncology Patient Advocate Encounter  Received notification from [facility] Patient Assistance program that patient has been successfully enrolled into their program to receive Gavreto from the manufacturer at $0 out of pocket until therapy discontinued or patient does not meet eligibility requirements.    I called and spoke with patient.  He knows we will have to re-apply.   Specialty Pharmacy that will dispense medication is [facility].  Patient knows to call the office with questions or concerns.   Oral Oncology Clinic will continue to follow.  [place] Patient [place] Phone [phone] Fax [phone] 09/11/2019 2:29 PM

## 2019-09-12 ENCOUNTER — Other Ambulatory Visit: Payer: Self-pay | Admitting: Radiology

## 2019-09-12 ENCOUNTER — Ambulatory Visit: Payer: 59 | Admitting: Hematology & Oncology

## 2019-09-12 ENCOUNTER — Other Ambulatory Visit: Payer: 59

## 2019-09-12 ENCOUNTER — Ambulatory Visit: Payer: 59

## 2019-09-13 ENCOUNTER — Encounter (HOSPITAL_COMMUNITY): Payer: Self-pay

## 2019-09-13 ENCOUNTER — Other Ambulatory Visit: Payer: Self-pay | Admitting: Hematology & Oncology

## 2019-09-13 ENCOUNTER — Other Ambulatory Visit: Payer: Self-pay

## 2019-09-13 ENCOUNTER — Telehealth (HOSPITAL_COMMUNITY): Payer: Self-pay

## 2019-09-13 ENCOUNTER — Ambulatory Visit (HOSPITAL_COMMUNITY)
Admission: RE | Admit: 2019-09-13 | Discharge: 2019-09-13 | Disposition: A | Discharge status: Home / Self Care | Payer: 59 | Source: Ambulatory Visit | Attending: Hematology & Oncology | Admitting: Hematology & Oncology

## 2019-09-13 DIAGNOSIS — Z5111 Encounter for antineoplastic chemotherapy: Secondary | ICD-10-CM | POA: Diagnosis not present

## 2019-09-13 DIAGNOSIS — C73 Malignant neoplasm of thyroid gland: Secondary | ICD-10-CM

## 2019-09-13 DIAGNOSIS — Z79899 Other long term (current) drug therapy: Secondary | ICD-10-CM | POA: Insufficient documentation

## 2019-09-13 DIAGNOSIS — C787 Secondary malignant neoplasm of liver and intrahepatic bile duct: Secondary | ICD-10-CM | POA: Diagnosis not present

## 2019-09-13 DIAGNOSIS — I10 Essential (primary) hypertension: Secondary | ICD-10-CM | POA: Diagnosis not present

## 2019-09-13 DIAGNOSIS — Z79890 Hormone replacement therapy (postmenopausal): Secondary | ICD-10-CM | POA: Diagnosis not present

## 2019-09-13 DIAGNOSIS — Z452 Encounter for adjustment and management of vascular access device: Secondary | ICD-10-CM | POA: Diagnosis not present

## 2019-09-13 HISTORY — PX: IR IMAGING GUIDED PORT INSERTION: IMG5740

## 2019-09-13 MED ORDER — MIDAZOLAM HCL 2 MG/2ML IJ SOLN
INTRAMUSCULAR | Status: AC | PRN
  Administered 2019-09-13 (×2): 0.5 mg via INTRAVENOUS
  Administered 2019-09-13: 1 mg via INTRAVENOUS
  Administered 2019-09-13: 0.5 mg via INTRAVENOUS

## 2019-09-13 MED ORDER — MIDAZOLAM HCL 2 MG/2ML IJ SOLN
INTRAMUSCULAR | Status: AC
  Filled 2019-09-13: qty 4

## 2019-09-13 MED ORDER — CEFAZOLIN SODIUM-DEXTROSE 2-4 GM/100ML-% IV SOLN: 2.0000 g | INTRAVENOUS | Status: AC

## 2019-09-13 MED ORDER — HEPARIN SOD (PORK) LOCK FLUSH 100 UNIT/ML IV SOLN
INTRAVENOUS | Status: AC | PRN
  Administered 2019-09-13: 500 [IU] via INTRAVENOUS

## 2019-09-13 MED ORDER — FENTANYL CITRATE (PF) 100 MCG/2ML IJ SOLN
INTRAMUSCULAR | Status: AC
  Filled 2019-09-13: qty 4

## 2019-09-13 MED ORDER — LIDOCAINE-EPINEPHRINE 1 %-1:100000 IJ SOLN
INTRAMUSCULAR | Status: AC | PRN
  Administered 2019-09-13: 15 mL

## 2019-09-13 MED ORDER — LIDOCAINE-EPINEPHRINE 1 %-1:100000 IJ SOLN
INTRAMUSCULAR | Status: AC
  Filled 2019-09-13: qty 1

## 2019-09-13 MED ORDER — HEPARIN SOD (PORK) LOCK FLUSH 100 UNIT/ML IV SOLN
INTRAVENOUS | Status: AC
  Filled 2019-09-13: qty 5

## 2019-09-13 MED ORDER — SODIUM CHLORIDE 0.9 % IV SOLN: INTRAVENOUS | Status: DC

## 2019-09-13 MED ORDER — CEFAZOLIN SODIUM-DEXTROSE 2-4 GM/100ML-% IV SOLN
INTRAVENOUS | Status: AC
  Administered 2019-09-13: 2 g via INTRAVENOUS
  Filled 2019-09-13: qty 100

## 2019-09-13 MED ORDER — FENTANYL CITRATE (PF) 100 MCG/2ML IJ SOLN
INTRAMUSCULAR | Status: AC | PRN
  Administered 2019-09-13: 50 ug via INTRAVENOUS
  Administered 2019-09-13: 25 ug via INTRAVENOUS
  Administered 2019-09-13: 50 ug via INTRAVENOUS
  Administered 2019-09-13: 25 ug via INTRAVENOUS

## 2019-09-13 NOTE — Procedures (Signed)
Pre Procedure Dx: Poor venous access Post Procedural Dx: Same  Successful placement of left IJ approach port-a-cath with tip at the superior caval atrial junction. The catheter is ready for immediate use.  Estimated Blood Loss: Minimal  Complications: None immediate.  Ronny Bacon, MD Pager #: [phone]

## 2019-09-13 NOTE — Discharge Instructions (Addendum)
Implanted Port Insertion, Care After This sheet gives you information about how to care for yourself after your procedure. Your health care provider may also give you more specific instructions. If you have problems or questions, contact your health care provider. What can I expect after the procedure? After the procedure, it is common to have:  Discomfort at the port insertion site.  Bruising on the skin over the port. This should improve over 3-4 days. Follow these instructions at home: Port care  After your port is placed, you will get a manufacturer's information card. The card has information about your port. Keep this card with you at all times.  Take care of the port as told by your health care provider. Ask your health care provider if you or a family member can get training for taking care of the port at home. A home health care nurse may also take care of the port.  Make sure to remember what type of port you have. Incision care      Follow instructions from your health care provider about how to take care of your port insertion site. Make sure you: ? Wash your hands with soap and water before and after you change your bandage (dressing). If soap and water are not available, use hand sanitizer. ? Change your dressing as told by your health care provider. ? Leave stitches (sutures), skin glue, or adhesive strips in place. These skin closures may need to stay in place for 2 weeks or longer. If adhesive strip edges start to loosen and curl up, you may trim the loose edges. Do not remove adhesive strips completely unless your health care provider tells you to do that.  Check your port insertion site every day for signs of infection. Check for: ? Redness, swelling, or pain. ? Fluid or blood. ? Warmth. ? Pus or a bad smell. Activity  Return to your normal activities as told by your health care provider. Ask your health care provider what activities are safe for you.  Do not  lift anything that is heavier than 10 lb (4.5 kg), or the limit that you are told, until your health care provider says that it is safe. General instructions  Take over-the-counter and prescription medicines only as told by your health care provider.  Do not take baths, swim, or use a hot tub until your health care provider approves. Ask your health care provider if you may take showers. You may only be allowed to take sponge baths.  Do not drive for 24 hours if you were given a sedative during your procedure.  Wear a medical alert bracelet in case of an emergency. This will tell any health care providers that you have a port.  Keep all follow-up visits as told by your health care provider. This is important. Contact a health care provider if:  You cannot flush your port with saline as directed, or you cannot draw blood from the port.  You have a fever or chills.  You have redness, swelling, or pain around your port insertion site.  You have fluid or blood coming from your port insertion site.  Your port insertion site feels warm to the touch.  You have pus or a bad smell coming from the port insertion site. Get help right away if:  You have chest pain or shortness of breath.  You have bleeding from your port that you cannot control. Summary  Take care of the port as told by your health   care provider. Keep the manufacturer's information card with you at all times.  Change your dressing as told by your health care provider.  Contact a health care provider if you have a fever or chills or if you have redness, swelling, or pain around your port insertion site.  Keep all follow-up visits as told by your health care provider. This information is not intended to replace advice given to you by your health care provider. Make sure you discuss any questions you have with your health care provider. Document Revised: 08/31/2017 Document Reviewed: 08/31/2017 Elsevier Patient Education   2020 Elsevier Inc. Moderate Conscious Sedation, Adult Sedation is the use of medicines to promote relaxation and relieve discomfort and anxiety. Moderate conscious sedation is a type of sedation. Under moderate conscious sedation, you are less alert than normal, but you are still able to respond to instructions, touch, or both. Moderate conscious sedation is used during short medical and dental procedures. It is milder than deep sedation, which is a type of sedation under which you cannot be easily woken up. It is also milder than general anesthesia, which is the use of medicines to make you unconscious. Moderate conscious sedation allows you to return to your regular activities sooner. Tell a health care provider about:  Any allergies you have.  All medicines you are taking, including vitamins, herbs, eye drops, creams, and over-the-counter medicines.  Use of steroids (by mouth or creams).  Any problems you or family members have had with sedatives and anesthetic medicines.  Any blood disorders you have.  Any surgeries you have had.  Any medical conditions you have, such as sleep apnea.  Whether you are pregnant or may be pregnant.  Any use of cigarettes, alcohol, marijuana, or street drugs. What are the risks? Generally, this is a safe procedure. However, problems may occur, including:  Getting too much medicine (oversedation).  Nausea.  Allergic reaction to medicines.  Trouble breathing. If this happens, a breathing tube may be used to help with breathing. It will be removed when you are awake and breathing on your own.  Heart trouble.  Lung trouble. What happens before the procedure? Staying hydrated Follow instructions from your health care provider about hydration, which may include:  Up to 2 hours before the procedure - you may continue to drink clear liquids, such as water, clear fruit juice, black coffee, and plain tea. Eating and drinking restrictions Follow  instructions from your health care provider about eating and drinking, which may include:  8 hours before the procedure - stop eating heavy meals or foods such as meat, fried foods, or fatty foods.  6 hours before the procedure - stop eating light meals or foods, such as toast or cereal.  6 hours before the procedure - stop drinking milk or drinks that contain milk.  2 hours before the procedure - stop drinking clear liquids. Medicine Ask your health care provider about:  Changing or stopping your regular medicines. This is especially important if you are taking diabetes medicines or blood thinners.  Taking medicines such as aspirin and ibuprofen. These medicines can thin your blood. Do not take these medicines before your procedure if your health care provider instructs you not to.  Tests and exams  You will have a physical exam.  You may have blood tests done to show: ? How well your kidneys and liver are working. ? How well your blood can clot. General instructions  Plan to have someone take you home from the   hospital or clinic.  If you will be going home right after the procedure, plan to have someone with you for 24 hours. What happens during the procedure?  An IV tube will be inserted into one of your veins.  Medicine to help you relax (sedative) will be given through the IV tube.  The medical or dental procedure will be performed. What happens after the procedure?  Your blood pressure, heart rate, breathing rate, and blood oxygen level will be monitored often until the medicines you were given have worn off.  Do not drive for 24 hours. This information is not intended to replace advice given to you by your health care provider. Make sure you discuss any questions you have with your health care provider. Document Revised: 01/15/2017 Document Reviewed: 05/25/2015 Elsevier Patient Education  2020 Elsevier Inc.  

## 2019-09-13 NOTE — H&P (Signed)
Chief Complaint: Patient was seen in consultation today for [name] a cath placement at the request of Brian [name]  Referring Physician(s): Brian [name]  Supervising Physician: Brian [name]  Patient Status: Brian [name] - Out-pt  History of Present Illness: Brian [name] is a 44 y.o. male   Thyroid cancer 03/2018 Has had po chemo treatments since then Metastasis to liver Follows with Dr Marin Olp  6/19 CT: IMPRESSION: 1. There are multiple arterially hyperenhancing masses of the liver. The largest confluent lesion in the subcapsular right lobe of the liver, hepatic segment VII, measures approximately 6.6 x 4.9 x 5.4 cm. Although these lesions are hypoenhancing on CT and difficult to compare to multiphasic MR, they do not appear to be significantly changed compared to recent prior CT examinations dated 07/27/2019 and 06/07/2019. They are however significantly increased in size compared to more remote prior MR examinations dated 09/25/2018, at which time there were smaller, less confluent lesions in the right lobe. 2. There are subcentimeter lesions anteriorly and inferiorly to the dominant mass in the right lobe, consistent with metastases that are clearly new compared to prior MR dated 09/25/2018 and possibly developed in the interval from prior CT examination dated 06/07/2019, however these lesions are difficult to visualize on CT. 3. Additional hyperenhancing lesion of the left lobe of the liver measuring 1.4 cm, which is unchanged and has previously been characterized as a benign hemangioma but on this examination does not clearly exhibit definitively benign contrast enhancement features. Continued attention on follow-up. 4. No additional sites of metastatic disease are identified in the abdomen. 5. Status post cholecystectomy.  Dr Marin Olp note: 09/06/19:  [name] is back for for follow-up. Unfortunately, for right now, there is no surgical option that we have for him.   He saw Dr. Jyl Heinz at [facility].  Dr. Crisoforo Oxford looked at his MRI.  There does seem to be a little bit too much disease in the liver.  He had disease in both the right and left lobe of the liver.  Dr. Crisoforo Oxford thought that this would require a significant partial hepatectomy which might not even be effective  Scheduled now for [name] placement To start IV Chemotherapy soon   Past Medical History:  Diagnosis Date  . Essential hypertension 09/09/2018  . Kidney stone   . Primary cancer of thyroid with metastasis to other site ([place]) 05/04/2018  . Rickettsia infection     Past Surgical History:  Procedure Laterality Date  . BIOPSY  04/05/2018   Procedure: BIOPSY;  Surgeon: Ronald Lobo, MD;  Location: [facility] ENDOS[facility];  Service: Endoscopy;;  . CHOLECYSTECTOMY    . ESOPHAGOGASTRODUODENOSCOPY (EGD) WITH PROPOFOL N/A 04/05/2018   Procedure: ESOPHAGOGASTRODUODENOSCOPY (EGD) WITH PROPOFOL;  Surgeon: Ronald Lobo, MD;  Location: [facility]OS[facility];  Service: Endoscopy;  Laterality: N/A;  . IR RADIOLOGIST EVAL & MGMT  10/11/2018    Allergies: Dextromethorphan-guaifenesin, Doxycycline, Gadobutrol, Gadolinium derivatives, Guaifenesin, Ibuprofen, Lisinopril, Pantoprazole, Tramadol, and Barium  Medications: Prior to Admission medications   Medication Sig Start Date End Date Taking? Authorizing Provider  cloNIDine (CATAPRES) 0.1 MG tablet Take 0.1 mg by mouth 2 (two) times daily.  09/06/19  Yes [provider]  cyclobenzaprine (FLEXERIL) 10 MG tablet Take 1 tablet (10 mg total) by mouth 3 (three) times daily as needed for muscle spasms. 05/18/19  Yes [name], MD  gabapentin (NEURONTIN) 300 MG capsule Take 1 capsule (300 mg total) by mouth 3 (three) times daily. 05/29/19  Yes [name], MD  levothyroxine (SYNTHROID)  200 MCG tablet Take 1 tablet (200 mcg total) by mouth daily before breakfast. 12/02/18  Yes Ennever, Rudell Cobb, MD  levothyroxine (SYNTHROID) 75 MCG tablet Take 75 mcg by mouth daily.  12/14/18  Yes [provider]  losartan (COZAAR) 25 MG tablet Take 1 tablet (25 mg total) by mouth 2 (two) times daily. 03/02/19  Yes Park Liter, MD  methylphenidate (RITALIN) 10 MG tablet Take 1 tablet (10 mg total) by mouth 2 (two) times daily. Patient taking differently: Take 10 mg by mouth daily.  06/07/19  Yes Ennever, Rudell Cobb, MD  metoCLOPramide (REGLAN) 10 MG tablet TAKE 1 TABLET BY MOUTH FOUR TIMES DAILY BEFORE MEALS AND AT BEDTIME 06/01/19  Yes Ennever, Rudell Cobb, MD  metolazone (ZAROXOLYN) 5 MG tablet Take 1 tablet (5 mg total) by mouth daily. Take 1 hour before Lasix. Patient taking differently: Take 5 mg by mouth daily as needed (leg swelling). Take 1 hour before Lasix. 12/22/18  Yes [place], Holli Humbles, NP  Multiple Vitamin (MULTIVITAMIN) capsule Take 1 capsule by mouth daily.   Yes [provider]  ondansetron (ZOFRAN) 8 MG tablet Take 8 mg by mouth every 8 (eight) hours as needed for nausea or vomiting.  07/29/18  Yes [provider]  oxyCODONE (OXYCONTIN) 20 mg 12 hr tablet Take 1 tablet (20 mg total) by mouth every 12 (twelve) hours. 08/07/19  Yes [name], MD  oxyCODONE 10 MG TABS Take 0.5 tablets (5 mg total) by mouth every 6 (six) hours as needed for severe pain. Patient taking differently: Take 5-10 mg by mouth every 6 (six) hours as needed (pain).  08/07/19  Yes Ennever, Rudell Cobb, MD  potassium chloride SA (KLOR-CON) 10 MEQ tablet Take 1 tablet (10 mEq total) by mouth daily. Patient taking differently: Take 10 mEq by mouth daily as needed (takes with lasix).  12/22/18  Yes [place], Holli Humbles, NP  promethazine (PHENERGAN) 12.5 MG tablet Take 12.5 mg by mouth at bedtime. 04/16/19  Yes [provider]  promethazine (PHENERGAN) 25 MG tablet TAKE 1 TABLET BY MOUTH EVERY 6 HOURS AS NEEDED FOR NAUSEA & VOMITING 06/01/19  Yes Ennever, Rudell Cobb, MD  TURMERIC CURCUMIN PO Take 1 capsule by mouth daily.   Yes [provider]  dexamethasone  (DECADRON) 4 MG tablet Take 1 tablet (4 mg total) by mouth 2 (two) times daily with a meal. Take 2 pills twice a day for 4 days -- start day BEFORE each chemotherapy 09/06/19   [name], MD  EPINEPHrine (EPIPEN 2-PAK) 0.3 mg/0.3 mL IJ SOAJ injection USE AS DIRECTED FOR LIFE THREATENING ALLERGIC REACTIONS Patient taking differently: Inject 0.3 mg into the muscle as needed for anaphylaxis. USE AS DIRECTED FOR LIFE THREATENING ALLERGIC REACTIONS 04/30/15   Kozlow, Donnamarie Poag, MD  furosemide (LASIX) 80 MG tablet Take 1 tablet (80 mg total) by mouth daily. Patient taking differently: Take 80 mg by mouth daily as needed for fluid.  12/22/18   [place], Holli Humbles, NP  isosorbide mononitrate (IMDUR) 60 MG 24 hr tablet Take 1 tablet (60 mg total) by mouth daily. 02/14/19 05/15/19  Park Liter, MD  levofloxacin (LEVAQUIN) 500 MG tablet Take 1 tablet (500 mg total) by mouth daily. Take daily for 14 days -- start day AFTER each cehmotherapy 09/06/19   [name], MD  predniSONE (DELTASONE) 20 MG tablet Take 1 tablet (20 mg total) by mouth daily with breakfast. Patient not taking: Reported on 09/12/2019 06/07/19   Ennever,  Rudell Cobb, MD  predniSONE (DELTASONE) 50 MG tablet 13 HOUR PREP - TAKE 1 TABLET BY MOUTH 13 HOURS,7 HOURS,AND 1 HOUR PRIOR TO SCAN;THE SCAN SHOULD BE PERFORMED WITHIN 6 HOURS OF LAST DOSE Patient not taking: Reported on 09/12/2019 05/24/19   [provider]     Family History  Problem Relation Age of Onset  . Diabetes Mother   . Heart disease Mother   . Heart failure Mother   . Heart attack Mother   . Hyperlipidemia Mother   . Hypertension Mother   . Hyperlipidemia Father   . Allergic rhinitis Father   . Rashes / Skin problems Father   . Sudden death Neg Hx   . Thyroid disease Neg Hx     Social History   Socioeconomic History  . Marital status: Married    Spouse name: Not on file  . Number of children: Not on file  . Years of education: Not on file  . Highest  education level: Not on file  Occupational History  . Not on file  Tobacco Use  . Smoking status: Never Smoker  . Smokeless tobacco: Never Used  Vaping Use  . Vaping Use: Never used  Substance and Sexual Activity  . Alcohol use: No  . Drug use: No  . Sexual activity: Not on file  Other Topics Concern  . Not on file  Social History Narrative  . Not on file   Social Determinants of Health   Financial Resource Strain:   . Difficulty of Paying Living Expenses:   Food Insecurity:   . Worried About Charity fundraiser in the Last Year:   . Arboriculturist in the Last Year:   Transportation Needs:   . Film/video editor (Medical):   Marland Kitchen Lack of Transportation (Non-Medical):   Physical Activity:   . Days of Exercise per Week:   . Minutes of Exercise per Session:   Stress:   . Feeling of Stress :   Social Connections:   . Frequency of Communication with Friends and Family:   . Frequency of Social Gatherings with Friends and Family:   . Attends Religious Services:   . Active Member of Clubs or Organizations:   . Attends Archivist Meetings:   Marland Kitchen Marital Status:     Review of Systems: A 12 point ROS discussed and pertinent positives are indicated in the HPI above.  All other systems are negative.  Review of Systems  Constitutional: Positive for appetite change and fatigue. Negative for activity change, fever and unexpected weight change.  HENT: Negative for sore throat.   Respiratory: Negative for cough and shortness of breath.   Cardiovascular: Negative for chest pain.  Gastrointestinal: Negative for abdominal pain.  Musculoskeletal: Positive for neck pain.  Psychiatric/Behavioral: Negative for behavioral problems and confusion.    Vital Signs: BP (!) 136/98   Pulse 75   Temp 98.4 F (36.9 C)   Resp 16   Ht 5\' 10"  (1.778 m)   SpO2 100%   BMI 38.60 kg/m   Physical Exam Vitals reviewed.  Cardiovascular:     Rate and Rhythm: Normal rate and regular  rhythm.     Heart sounds: Normal heart sounds.  Pulmonary:     Effort: Pulmonary effort is normal.     Breath sounds: Normal breath sounds.  Abdominal:     Palpations: Abdomen is soft.  Musculoskeletal:        General: Normal range of motion.  Cervical back: Normal range of motion.  Skin:    General: Skin is warm.     Findings: Erythema present.     Comments: Neck is reddened    Neurological:     Mental Status: He is alert and oriented to person, place, and time.  Psychiatric:        Behavior: Behavior normal.     Imaging: No results found.  Labs:  CBC: Recent Labs    06/07/19 0838 07/10/19 1456 08/14/19 0810 09/06/19 1156  WBC 2.9* 5.5 3.7* 4.5  HGB 13.4 14.4 12.6* 13.3  HCT 39.4 42.1 36.8* 39.3  PLT 173 174 174 204    COAGS: No results for input(s): INR, APTT in the last 8760 hours.  BMP: Recent Labs    06/07/19 0838 07/10/19 1456 08/14/19 0810 09/06/19 1156  NA 142 138 137 137  K 4.5 4.3 4.2 4.9  CL 105 101 102 100  CO2 34* 30 31 33*  GLUCOSE 127* 98 139* 97  BUN 12 14 15 13   CALCIUM 9.1 9.6 8.7* 10.6*  CREATININE 0.94 0.92 0.77 0.90  GFRNONAA >60 >60 >60 >60  GFRAA >60 >60 >60 >60    LIVER FUNCTION TESTS: Recent Labs    06/07/19 0838 07/10/19 1456 08/14/19 0810 09/06/19 1156  BILITOT 0.5 0.5 0.5 0.7  AST 21 31 19 28   ALT 25 38 30 25  ALKPHOS 69 87 61 74  PROT 5.3* 6.2* 5.6* 6.3*  ALBUMIN 3.4* 3.8 3.7 4.0    TUMOR MARKERS: No results for input(s): AFPTM, CEA, CA199, CHROMGRNA in the last 8760 hours.  Assessment and Plan:  Thyroid cancer- mets to liver To start IV chemotherapy soon Scheduled for Port a cath placement Risks and benefits of image guided port-a-catheter placement was discussed with the patient including, but not limited to bleeding, infection, pneumothorax, or fibrin sheath development and need for additional procedures.  All of the patient's questions were answered, patient is agreeable to proceed. Consent  signed and in chart.   Thank you for this interesting consult.  I greatly enjoyed meeting CHRISANGEL ESKENAZI and look forward to participating in their care.  A copy of this report was sent to the requesting provider on this date.  Electronically Signed: Lavonia Drafts, PA-C 09/13/2019, 12:52 PM   I spent a total of  30 Minutes   in face to face in clinical consultation, greater than 50% of which was counseling/coordinating care for [name] placement

## 2019-09-13 NOTE — Sedation Documentation (Signed)
Patient is resting comfortably. 

## 2019-09-14 ENCOUNTER — Inpatient Hospital Stay: Payer: 59

## 2019-09-14 ENCOUNTER — Other Ambulatory Visit: Payer: Self-pay | Admitting: Family

## 2019-09-14 ENCOUNTER — Other Ambulatory Visit: Payer: Self-pay | Admitting: *Deleted

## 2019-09-14 VITALS — BP 132/89 | HR 81 | Temp 98.8°F | Resp 17

## 2019-09-14 DIAGNOSIS — C787 Secondary malignant neoplasm of liver and intrahepatic bile duct: Secondary | ICD-10-CM | POA: Diagnosis not present

## 2019-09-14 DIAGNOSIS — R809 Proteinuria, unspecified: Secondary | ICD-10-CM

## 2019-09-14 DIAGNOSIS — C73 Malignant neoplasm of thyroid gland: Secondary | ICD-10-CM

## 2019-09-14 DIAGNOSIS — Z5189 Encounter for other specified aftercare: Secondary | ICD-10-CM | POA: Diagnosis not present

## 2019-09-14 DIAGNOSIS — Z5111 Encounter for antineoplastic chemotherapy: Secondary | ICD-10-CM | POA: Diagnosis not present

## 2019-09-14 DIAGNOSIS — R0609 Other forms of dyspnea: Secondary | ICD-10-CM

## 2019-09-14 DIAGNOSIS — R0600 Dyspnea, unspecified: Secondary | ICD-10-CM

## 2019-09-14 LAB — CMP (CANCER CENTER ONLY)
ALT: 15 U/L (ref 0–44)
AST: 13 U/L — ABNORMAL LOW (ref 15–41)
Albumin: 3.8 g/dL (ref 3.5–5.0)
Alkaline Phosphatase: 85 U/L (ref 38–126)
Anion gap: 5 (ref 5–15)
BUN: 20 mg/dL (ref 6–20)
CO2: 26 mmol/L (ref 22–32)
Calcium: 9.3 mg/dL (ref 8.9–10.3)
Chloride: 106 mmol/L (ref 98–111)
Creatinine: 0.77 mg/dL (ref 0.61–1.24)
GFR, Est AFR Am: >60 mL/min (ref >60)
GFR, Estimated: >60 mL/min (ref >60)
Glucose, Bld: 180 mg/dL — ABNORMAL HIGH (ref 70–99)
Potassium: 4.4 mmol/L (ref 3.5–5.1)
Sodium: 137 mmol/L (ref 135–145)
Total Bilirubin: 0.4 mg/dL (ref 0.3–1.2)
Total Protein: 6.2 g/dL — ABNORMAL LOW (ref 6.5–8.1)

## 2019-09-14 LAB — CBC WITH DIFFERENTIAL (CANCER CENTER ONLY)
Abs Immature Granulocytes: 0.02 10*3/uL (ref 0.00–0.07)
Basophils Absolute: 0 10*3/uL (ref 0.0–0.1)
Basophils Relative: 0 %
Eosinophils Absolute: 0 10*3/uL (ref 0.0–0.5)
Eosinophils Relative: 0 %
HCT: 35.7 % — ABNORMAL LOW (ref 39.0–52.0)
Hemoglobin: 12 g/dL — ABNORMAL LOW (ref 13.0–17.0)
Immature Granulocytes: 0 %
Lymphocytes Relative: 11 %
Lymphs Abs: 0.5 10*3/uL — ABNORMAL LOW (ref 0.7–4.0)
MCH: 31.5 pg (ref 26.0–34.0)
MCHC: 33.6 g/dL (ref 30.0–36.0)
MCV: 93.7 fL (ref 80.0–100.0)
Monocytes Absolute: 0.1 10*3/uL (ref 0.1–1.0)
Monocytes Relative: 3 %
Neutro Abs: 4.5 10*3/uL (ref 1.7–7.7)
Neutrophils Relative %: 86 %
Platelet Count: 287 10*3/uL (ref 150–400)
RBC: 3.81 MIL/uL — ABNORMAL LOW (ref 4.22–5.81)
RDW: 13.4 % (ref 11.5–15.5)
WBC Count: 5.2 10*3/uL (ref 4.0–10.5)
nRBC: 0 % (ref 0.0–0.2)

## 2019-09-14 MED ORDER — LORAZEPAM 0.5 MG PO TABS: 0.5000 mg | ORAL_TABLET | Freq: Four times a day (QID) | ORAL | 0 refills | Status: DC | PRN

## 2019-09-14 MED ORDER — PROCHLORPERAZINE MALEATE 10 MG PO TABS: 10.0000 mg | ORAL_TABLET | Freq: Four times a day (QID) | ORAL | 1 refills | Status: DC | PRN

## 2019-09-14 MED ORDER — SODIUM CHLORIDE 0.9 % IV SOLN
Freq: Once | INTRAVENOUS | Status: AC
  Filled 2019-09-14: qty 10

## 2019-09-14 MED ORDER — PALONOSETRON HCL INJECTION 0.25 MG/5ML
INTRAVENOUS | Status: AC
  Filled 2019-09-14: qty 5

## 2019-09-14 MED ORDER — SODIUM CHLORIDE 0.9 % IV SOLN
Freq: Once | INTRAVENOUS | Status: AC
  Filled 2019-09-14: qty 250

## 2019-09-14 MED ORDER — SODIUM CHLORIDE 0.9 % IV SOLN
20.0000 mg | Freq: Once | INTRAVENOUS | Status: AC
  Administered 2019-09-14: 20 mg via INTRAVENOUS
  Filled 2019-09-14: qty 2

## 2019-09-14 MED ORDER — SODIUM CHLORIDE 0.9 % IV SOLN
75.0000 mg/m2 | Freq: Once | INTRAVENOUS | Status: AC
  Administered 2019-09-14: 184 mg via INTRAVENOUS
  Filled 2019-09-14: qty 184

## 2019-09-14 MED ORDER — HEPARIN SOD (PORK) LOCK FLUSH 100 UNIT/ML IV SOLN
500.0000 [IU] | Freq: Once | INTRAVENOUS | Status: AC | PRN
  Administered 2019-09-14: 500 [IU]
  Filled 2019-09-14: qty 5

## 2019-09-14 MED ORDER — LIDOCAINE-PRILOCAINE 2.5-2.5 % EX CREA: TOPICAL_CREAM | CUTANEOUS | 3 refills | Status: DC

## 2019-09-14 MED ORDER — SODIUM CHLORIDE 0.9 % IV SOLN
75.0000 mg/m2 | Freq: Once | INTRAVENOUS | Status: AC
  Administered 2019-09-14: 180 mg via INTRAVENOUS
  Filled 2019-09-14: qty 18

## 2019-09-14 MED ORDER — SODIUM CHLORIDE 0.9% FLUSH
10.0000 mL | INTRAVENOUS | Status: DC | PRN
  Administered 2019-09-14: 10 mL
  Filled 2019-09-14: qty 10

## 2019-09-14 MED ORDER — PALONOSETRON HCL INJECTION 0.25 MG/5ML
0.2500 mg | Freq: Once | INTRAVENOUS | Status: AC
  Administered 2019-09-14: 0.25 mg via INTRAVENOUS

## 2019-09-14 MED ORDER — SODIUM CHLORIDE 0.9 % IV SOLN
150.0000 mg | Freq: Once | INTRAVENOUS | Status: AC
  Administered 2019-09-14: 150 mg via INTRAVENOUS
  Filled 2019-09-14: qty 150

## 2019-09-14 NOTE — Patient Instructions (Signed)
[facility] Discharge Instructions for Patients Receiving Chemotherapy  Today you received the following chemotherapy agents Docetaxel (Taxotere) and Cisplatin.  To help prevent nausea and vomiting after your treatment, we encourage you to take your nausea medication as directed BUT NO ZOFRAN FOR 3 DAYS AFTER CHEMO, take compazine instead.  If you develop nausea and vomiting that is not controlled by your nausea medication, call the clinic.   BELOW ARE SYMPTOMS THAT SHOULD BE REPORTED IMMEDIATELY:  *FEVER GREATER THAN 100.5 F  *CHILLS WITH OR WITHOUT FEVER  NAUSEA AND VOMITING THAT IS NOT CONTROLLED WITH YOUR NAUSEA MEDICATION  *UNUSUAL SHORTNESS OF BREATH  *UNUSUAL BRUISING OR BLEEDING  TENDERNESS IN MOUTH AND THROAT WITH OR WITHOUT PRESENCE OF ULCERS  *URINARY PROBLEMS  *BOWEL PROBLEMS  UNUSUAL RASH Items with * indicate a potential emergency and should be followed up as soon as possible.  Feel free to call the clinic you have any questions or concerns. The clinic phone number is [phone].  Please show the [facility] at check-in to the Emergency Department and triage nurse.   Docetaxel injection What is this medicine? DOCETAXEL (doe se TAX el) is a chemotherapy drug. It targets fast dividing cells, like cancer cells, and causes these cells to die. This medicine is used to treat many types of cancers like breast cancer, certain stomach cancers, head and neck cancer, lung cancer, and prostate cancer. This medicine may be used for other purposes; ask your health care provider or pharmacist if you have questions. COMMON BRAND NAME(S): Docefrez, Taxotere What should I tell my health care provider before I take this medicine? They need to know if you have any of these conditions:  infection (especially a virus infection such as chickenpox, cold sores, or herpes)  liver disease  low blood counts, like low white cell, platelet, or red cell  counts  an unusual or allergic reaction to docetaxel, polysorbate 80, other chemotherapy agents, other medicines, foods, dyes, or preservatives  pregnant or trying to get pregnant  breast-feeding How should I use this medicine? This drug is given as an infusion into a vein. It is administered in a hospital or clinic by a specially trained health care professional. Talk to your pediatrician regarding the use of this medicine in children. Special care may be needed. Overdosage: If you think you have taken too much of this medicine contact a poison control center or emergency room at once. NOTE: This medicine is only for you. Do not share this medicine with others. What if I miss a dose? It is important not to miss your dose. Call your doctor or health care professional if you are unable to keep an appointment. What may interact with this medicine?  aprepitant  certain antibiotics like erythromycin or clarithromycin  certain antivirals for HIV or hepatitis  certain medicines for fungal infections like fluconazole, itraconazole, ketoconazole, posaconazole, or voriconazole  cimetidine  ciprofloxacin  conivaptan  cyclosporine  dronedarone  fluvoxamine  grapefruit juice  imatinib  verapamil This list may not describe all possible interactions. Give your health care provider a list of all the medicines, herbs, non-prescription drugs, or dietary supplements you use. Also tell them if you smoke, drink alcohol, or use illegal drugs. Some items may interact with your medicine. What should I watch for while using this medicine? Your condition will be monitored carefully while you are receiving this medicine. You will need important blood work done while you are taking this medicine. Call your doctor  or health care professional for advice if you get a fever, chills or sore throat, or other symptoms of a cold or flu. Do not treat yourself. This drug decreases your body's ability to fight  infections. Try to avoid being around people who are sick. Some products may contain alcohol. Ask your health care professional if this medicine contains alcohol. Be sure to tell all health care professionals you are taking this medicine. Certain medicines, like metronidazole and disulfiram, can cause an unpleasant reaction when taken with alcohol. The reaction includes flushing, headache, nausea, vomiting, sweating, and increased thirst. The reaction can last from 30 minutes to several hours. You may get drowsy or dizzy. Do not drive, use machinery, or do anything that needs mental alertness until you know how this medicine affects you. Do not stand or sit up quickly, especially if you are an older patient. This reduces the risk of dizzy or fainting spells. Alcohol may interfere with the effect of this medicine. Talk to your health care professional about your risk of cancer. You may be more at risk for certain types of cancer if you take this medicine. Do not become pregnant while taking this medicine or for 6 months after stopping it. Women should inform their doctor if they wish to become pregnant or think they might be pregnant. There is a potential for serious side effects to an unborn child. Talk to your health care professional or pharmacist for more information. Do not breast-feed an infant while taking this medicine or for 1 week after stopping it. Males who get this medicine must use a condom during sex with females who can get pregnant. If you get a woman pregnant, the baby could have birth defects. The baby could die before they are born. You will need to continue wearing a condom for 3 months after stopping the medicine. Tell your health care provider right away if your partner becomes pregnant while you are taking this medicine. This may interfere with the ability to father a child. You should talk to your doctor or health care professional if you are concerned about your fertility. What side  effects may I notice from receiving this medicine? Side effects that you should report to your doctor or health care professional as soon as possible:  allergic reactions like skin rash, itching or hives, swelling of the face, lips, or tongue  blurred vision  breathing problems  changes in vision  low blood counts - This drug may decrease the number of white blood cells, red blood cells and platelets. You may be at increased risk for infections and bleeding.  nausea and vomiting  pain, redness or irritation at site where injected  pain, tingling, numbness in the hands or feet  redness, blistering, peeling, or loosening of the skin, including inside the mouth  signs of decreased platelets or bleeding - bruising, pinpoint red spots on the skin, black, tarry stools, nosebleeds  signs of decreased red blood cells - unusually weak or tired, fainting spells, lightheadedness  signs of infection - fever or chills, cough, sore throat, pain or difficulty passing urine  swelling of the ankle, feet, hands Side effects that usually do not require medical attention (report to your doctor or health care professional if they continue or are bothersome):  constipation  diarrhea  fingernail or toenail changes  hair loss  loss of appetite  mouth sores  muscle pain This list may not describe all possible side effects. Call your doctor for medical advice about  side effects. You may report side effects to FDA at 1-800-FDA-1088. Where should I keep my medicine? This drug is given in a hospital or clinic and will not be stored at home. NOTE: This sheet is a summary. It may not cover all possible information. If you have questions about this medicine, talk to your doctor, pharmacist, or health care provider.  2020 [employer]/Gold Standard (2018-09-29 10:19:06) Cisplatin injection What is this medicine? CISPLATIN (SIS pla tin) is a chemotherapy drug. It targets fast dividing cells, like cancer  cells, and causes these cells to die. This medicine is used to treat many types of cancer like bladder, ovarian, and testicular cancers. This medicine may be used for other purposes; ask your health care provider or pharmacist if you have questions. COMMON BRAND NAME(S): Platinol, Platinol -AQ What should I tell my health care provider before I take this medicine? They need to know if you have any of these conditions:  eye disease, vision problems  hearing problems  kidney disease  low blood counts, like white cells, platelets, or red blood cells  tingling of the fingers or toes, or other nerve disorder  an unusual or allergic reaction to cisplatin, carboplatin, oxaliplatin, other medicines, foods, dyes, or preservatives  pregnant or trying to get pregnant  breast-feeding How should I use this medicine? This drug is given as an infusion into a vein. It is administered in a hospital or clinic by a specially trained health care professional. Talk to your pediatrician regarding the use of this medicine in children. Special care may be needed. Overdosage: If you think you have taken too much of this medicine contact a poison control center or emergency room at once. NOTE: This medicine is only for you. Do not share this medicine with others. What if I miss a dose? It is important not to miss a dose. Call your doctor or health care professional if you are unable to keep an appointment. What may interact with this medicine? This medicine may interact with the following medications:  foscarnet  certain antibiotics like amikacin, gentamicin, neomycin, polymyxin B, streptomycin, tobramycin, vancomycin This list may not describe all possible interactions. Give your health care provider a list of all the medicines, herbs, non-prescription drugs, or dietary supplements you use. Also tell them if you smoke, drink alcohol, or use illegal drugs. Some items may interact with your medicine. What  should I watch for while using this medicine? Your condition will be monitored carefully while you are receiving this medicine. You will need important blood work done while you are taking this medicine. This drug may make you feel generally unwell. This is not uncommon, as chemotherapy can affect healthy cells as well as cancer cells. Report any side effects. Continue your course of treatment even though you feel ill unless your doctor tells you to stop. This medicine may increase your risk of getting an infection. Call your healthcare professional for advice if you get a fever, chills, or sore throat, or other symptoms of a cold or flu. Do not treat yourself. Try to avoid being around people who are sick. Avoid taking medicines that contain aspirin, acetaminophen, ibuprofen, naproxen, or ketoprofen unless instructed by your healthcare professional. These medicines may hide a fever. This medicine may increase your risk to bruise or bleed. Call your doctor or health care professional if you notice any unusual bleeding. Be careful brushing and flossing your teeth or using a toothpick because you may get an infection or bleed more easily.  If you have any dental work done, tell your dentist you are receiving this medicine. Do not become pregnant while taking this medicine or for 14 months after stopping it. Women should inform their healthcare professional if they wish to become pregnant or think they might be pregnant. Men should not father a child while taking this medicine and for 11 months after stopping it. There is potential for serious side effects to an unborn child. Talk to your healthcare professional for more information. Do not breast-feed an infant while taking this medicine. This medicine has caused ovarian failure in some women. This medicine may make it more difficult to get pregnant. Talk to your healthcare professional if you are concerned about your fertility. This medicine has caused  decreased sperm counts in some men. This may make it more difficult to father a child. Talk to your healthcare professional if you are concerned about your fertility. Drink fluids as directed while you are taking this medicine. This will help protect your kidneys. Call your doctor or health care professional if you get diarrhea. Do not treat yourself. What side effects may I notice from receiving this medicine? Side effects that you should report to your doctor or health care professional as soon as possible:  allergic reactions like skin rash, itching or hives, swelling of the face, lips, or tongue  blurred vision  changes in vision  decreased hearing or ringing of the ears  nausea, vomiting  pain, redness, or irritation at site where injected  pain, tingling, numbness in the hands or feet  signs and symptoms of bleeding such as bloody or black, tarry stools; red or dark brown urine; spitting up blood or brown material that looks like coffee grounds; red spots on the skin; unusual bruising or bleeding from the eyes, gums, or nose  signs and symptoms of infection like fever; chills; cough; sore throat; pain or trouble passing urine  signs and symptoms of kidney injury like trouble passing urine or change in the amount of urine  signs and symptoms of low red blood cells or anemia such as unusually weak or tired; feeling faint or lightheaded; falls; breathing problems Side effects that usually do not require medical attention (report to your doctor or health care professional if they continue or are bothersome):  loss of appetite  mouth sores  muscle cramps This list may not describe all possible side effects. Call your doctor for medical advice about side effects. You may report side effects to FDA at 1-800-FDA-1088. Where should I keep my medicine? This drug is given in a hospital or clinic and will not be stored at home. NOTE: This sheet is a summary. It may not cover all  possible information. If you have questions about this medicine, talk to your doctor, pharmacist, or health care provider.  2020 [employer]vier/Gold Standard (2018-01-28 15:59:17)

## 2019-09-14 NOTE — Progress Notes (Signed)
It is ok to always run Post hydration fluids with Cisplatin and then for one hour after that, per Dr. Marin Olp.

## 2019-09-15 ENCOUNTER — Inpatient Hospital Stay: Payer: 59

## 2019-09-15 ENCOUNTER — Other Ambulatory Visit (HOSPITAL_COMMUNITY): Payer: 59

## 2019-09-15 ENCOUNTER — Telehealth: Payer: Self-pay

## 2019-09-15 ENCOUNTER — Other Ambulatory Visit: Payer: Self-pay

## 2019-09-15 VITALS — BP 155/86 | HR 90 | Temp 98.1°F | Resp 19

## 2019-09-15 DIAGNOSIS — C73 Malignant neoplasm of thyroid gland: Secondary | ICD-10-CM

## 2019-09-15 DIAGNOSIS — C787 Secondary malignant neoplasm of liver and intrahepatic bile duct: Secondary | ICD-10-CM | POA: Diagnosis not present

## 2019-09-15 DIAGNOSIS — Z5189 Encounter for other specified aftercare: Secondary | ICD-10-CM | POA: Diagnosis not present

## 2019-09-15 DIAGNOSIS — Z5111 Encounter for antineoplastic chemotherapy: Secondary | ICD-10-CM | POA: Diagnosis not present

## 2019-09-15 MED ORDER — PEGFILGRASTIM-JMDB 6 MG/0.6ML ~~LOC~~ SOSY
6.0000 mg | PREFILLED_SYRINGE | Freq: Once | SUBCUTANEOUS | Status: AC
  Administered 2019-09-15: 6 mg via SUBCUTANEOUS

## 2019-09-15 NOTE — Telephone Encounter (Signed)
Spoke with pt during today's injection visit re: chemo f/u call.  Pt reports nausea that is controlled with anti-emetics. Expresses understanding of how take home meds. Had an extended episode of hiccups. Amy, RN discussed with Dr Marin Olp who does not make new orders at this time. Patient is aware to notify office if hiccups resume and are uncontrolled. Pt denies bowel changes or alteration in appetite. Reports fatigue. Aware of how to reach our office and on call service. dph

## 2019-09-15 NOTE — Patient Instructions (Signed)

## 2019-09-19 ENCOUNTER — Telehealth: Payer: Self-pay | Admitting: Hematology & Oncology

## 2019-09-20 ENCOUNTER — Inpatient Hospital Stay: Payer: 59 | Attending: Hematology & Oncology

## 2019-09-20 ENCOUNTER — Other Ambulatory Visit: Payer: Self-pay | Admitting: *Deleted

## 2019-09-20 ENCOUNTER — Inpatient Hospital Stay: Payer: 59

## 2019-09-20 ENCOUNTER — Encounter: Payer: Self-pay | Admitting: Hematology & Oncology

## 2019-09-20 ENCOUNTER — Other Ambulatory Visit: Payer: Self-pay

## 2019-09-20 ENCOUNTER — Inpatient Hospital Stay (HOSPITAL_BASED_OUTPATIENT_CLINIC_OR_DEPARTMENT_OTHER): Payer: 59 | Admitting: Hematology & Oncology

## 2019-09-20 ENCOUNTER — Telehealth: Payer: Self-pay | Admitting: *Deleted

## 2019-09-20 ENCOUNTER — Ambulatory Visit: Payer: 59 | Admitting: Hematology & Oncology

## 2019-09-20 ENCOUNTER — Other Ambulatory Visit (HOSPITAL_BASED_OUTPATIENT_CLINIC_OR_DEPARTMENT_OTHER): Payer: Self-pay | Admitting: Hematology & Oncology

## 2019-09-20 VITALS — BP 110/73 | HR 113 | Temp 99.3°F | Resp 20

## 2019-09-20 VITALS — BP 119/76 | HR 92 | Resp 18

## 2019-09-20 DIAGNOSIS — C73 Malignant neoplasm of thyroid gland: Secondary | ICD-10-CM

## 2019-09-20 DIAGNOSIS — Z5111 Encounter for antineoplastic chemotherapy: Secondary | ICD-10-CM | POA: Diagnosis not present

## 2019-09-20 DIAGNOSIS — Z5189 Encounter for other specified aftercare: Secondary | ICD-10-CM | POA: Insufficient documentation

## 2019-09-20 DIAGNOSIS — R0609 Other forms of dyspnea: Secondary | ICD-10-CM

## 2019-09-20 DIAGNOSIS — R0600 Dyspnea, unspecified: Secondary | ICD-10-CM

## 2019-09-20 DIAGNOSIS — C787 Secondary malignant neoplasm of liver and intrahepatic bile duct: Secondary | ICD-10-CM | POA: Diagnosis not present

## 2019-09-20 DIAGNOSIS — R112 Nausea with vomiting, unspecified: Secondary | ICD-10-CM

## 2019-09-20 DIAGNOSIS — R809 Proteinuria, unspecified: Secondary | ICD-10-CM

## 2019-09-20 DIAGNOSIS — T451X5A Adverse effect of antineoplastic and immunosuppressive drugs, initial encounter: Secondary | ICD-10-CM

## 2019-09-20 LAB — SAMPLE TO BLOOD BANK

## 2019-09-20 LAB — CMP (CANCER CENTER ONLY)
ALT: 17 U/L (ref 0–44)
AST: 14 U/L — ABNORMAL LOW (ref 15–41)
Albumin: 4.1 g/dL (ref 3.5–5.0)
Alkaline Phosphatase: 114 U/L (ref 38–126)
Anion gap: 9 (ref 5–15)
BUN: 20 mg/dL (ref 6–20)
CO2: 26 mmol/L (ref 22–32)
Calcium: 9.3 mg/dL (ref 8.9–10.3)
Chloride: 100 mmol/L (ref 98–111)
Creatinine: 0.97 mg/dL (ref 0.61–1.24)
GFR, Est AFR Am: >60 mL/min (ref >60)
GFR, Estimated: >60 mL/min (ref >60)
Glucose, Bld: 118 mg/dL — ABNORMAL HIGH (ref 70–99)
Potassium: 3.9 mmol/L (ref 3.5–5.1)
Sodium: 135 mmol/L (ref 135–145)
Total Bilirubin: 0.5 mg/dL (ref 0.3–1.2)
Total Protein: 6.5 g/dL (ref 6.5–8.1)

## 2019-09-20 LAB — CBC WITH DIFFERENTIAL (CANCER CENTER ONLY)
Abs Immature Granulocytes: 0.06 10*3/uL (ref 0.00–0.07)
Basophils Absolute: 0.1 10*3/uL (ref 0.0–0.1)
Basophils Relative: 1 %
Eosinophils Absolute: 0.1 10*3/uL (ref 0.0–0.5)
Eosinophils Relative: 1 %
HCT: 41.5 % (ref 39.0–52.0)
Hemoglobin: 14.4 g/dL (ref 13.0–17.0)
Immature Granulocytes: 1 %
Lymphocytes Relative: 15 %
Lymphs Abs: 1.3 10*3/uL (ref 0.7–4.0)
MCH: 31.5 pg (ref 26.0–34.0)
MCHC: 34.7 g/dL (ref 30.0–36.0)
MCV: 90.8 fL (ref 80.0–100.0)
Monocytes Absolute: 0.9 10*3/uL (ref 0.1–1.0)
Monocytes Relative: 11 %
Neutro Abs: 6.5 10*3/uL (ref 1.7–7.7)
Neutrophils Relative %: 71 %
Platelet Count: 282 10*3/uL (ref 150–400)
RBC: 4.57 MIL/uL (ref 4.22–5.81)
RDW: 13.2 % (ref 11.5–15.5)
WBC Count: 9 10*3/uL (ref 4.0–10.5)
nRBC: 0 % (ref 0.0–0.2)

## 2019-09-20 MED ORDER — HYDROMORPHONE HCL 4 MG/ML IJ SOLN
4.0000 mg | Freq: Once | INTRAMUSCULAR | Status: AC
  Administered 2019-09-20: 4 mg via INTRAVENOUS

## 2019-09-20 MED ORDER — SODIUM CHLORIDE 0.9 % IV SOLN
20.0000 mg | Freq: Once | INTRAVENOUS | Status: DC
  Filled 2019-09-20: qty 2

## 2019-09-20 MED ORDER — HYDROMORPHONE HCL 4 MG/ML IJ SOLN
INTRAMUSCULAR | Status: AC
  Filled 2019-09-20: qty 1

## 2019-09-20 MED ORDER — SODIUM CHLORIDE 0.9 % IV SOLN
150.0000 mg | Freq: Once | INTRAVENOUS | Status: AC
  Administered 2019-09-20: 150 mg via INTRAVENOUS
  Filled 2019-09-20: qty 5

## 2019-09-20 MED ORDER — OLANZAPINE 10 MG PO TABS
10.0000 mg | ORAL_TABLET | Freq: Every day | ORAL | 4 refills | Status: DC
Start: 2019-09-20 — End: 2019-11-27

## 2019-09-20 MED ORDER — OXYCODONE HCL 10 MG PO TABS: 10.0000 mg | ORAL_TABLET | Freq: Four times a day (QID) | ORAL | 0 refills | Status: DC | PRN

## 2019-09-20 MED ORDER — HEPARIN SOD (PORK) LOCK FLUSH 100 UNIT/ML IV SOLN
500.0000 [IU] | Freq: Once | INTRAVENOUS | Status: AC
  Administered 2019-09-20: 500 [IU] via INTRAVENOUS
  Filled 2019-09-20: qty 5

## 2019-09-20 MED ORDER — OXYCODONE HCL ER 20 MG PO T12A: 20.0000 mg | EXTENDED_RELEASE_TABLET | Freq: Two times a day (BID) | ORAL | 0 refills | Status: DC

## 2019-09-20 MED ORDER — SODIUM CHLORIDE 0.9 % IV SOLN
150.0000 mg | Freq: Once | INTRAVENOUS | Status: DC
  Filled 2019-09-20: qty 5

## 2019-09-20 MED ORDER — SODIUM CHLORIDE 0.9% FLUSH
10.0000 mL | INTRAVENOUS | Status: DC | PRN
  Administered 2019-09-20: 10 mL via INTRAVENOUS
  Filled 2019-09-20: qty 10

## 2019-09-20 MED ORDER — SODIUM CHLORIDE 0.9 % IV SOLN
10.0000 mg | Freq: Once | INTRAVENOUS | Status: AC
  Administered 2019-09-20: 10 mg via INTRAVENOUS
  Filled 2019-09-20: qty 10

## 2019-09-20 MED ORDER — SODIUM CHLORIDE 0.9 % IV SOLN
16.0000 mg | Freq: Once | INTRAVENOUS | Status: DC
  Filled 2019-09-20: qty 8

## 2019-09-20 MED ORDER — ONDANSETRON 16 MG/50ML IVPB (CHCC)
16.0000 mg | Freq: Once | INTRAVENOUS | Status: DC
  Filled 2019-09-20: qty 16

## 2019-09-20 MED ORDER — SODIUM CHLORIDE 0.9 % IV SOLN
16.0000 mg | Freq: Once | INTRAVENOUS | Status: AC
  Administered 2019-09-20: 16 mg via INTRAVENOUS
  Filled 2019-09-20: qty 8

## 2019-09-20 MED ORDER — SODIUM CHLORIDE 0.9 % IV SOLN
INTRAVENOUS | Status: AC
  Filled 2019-09-20: qty 250

## 2019-09-20 MED ORDER — HYDROMORPHONE HCL 1 MG/ML IJ SOLN: 4.0000 mg | Freq: Once | INTRAMUSCULAR | Status: AC

## 2019-09-20 MED ORDER — SODIUM CHLORIDE 0.9 % IV SOLN
INTRAVENOUS | Status: DC
  Filled 2019-09-20 (×2): qty 250

## 2019-09-20 NOTE — Progress Notes (Signed)
Hematology and Oncology Follow Up Visit  Brian [name] [id number] 27-Oct-1975 44 y.o. 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 is back for an unscheduled visit.  He called this morning saying that he was not feeling well at all.  He had chemotherapy last week.  He had Taxotere/cis-platinum.  He said he began to feel poorly on Saturday.  He started to have a lot of pain yesterday.  He is on OxyContin and oxycodone.  He was having nausea.  He really was not taking his medications.  We had him come in.  He looks quite fatigued and weak.  He had no fever.  He had no bleeding.  He had no obvious diarrhea.  He was hurting in his bones.  This pain may have been from the Neulasta injection that he received post chemotherapy.  He has had no rashes.  He has had no cough.  There has been no mouth sores.  We got him into our treatment area.  We started him on IV fluids.  He received IV antiemetics.  We gave him some Decadron.  Thankfully he began to feel better.  He got some IV Dilaudid which has helped.  I am going to try him on some olanzapine (10 mg p.o q. at bedtime) to see if this might help with some of his nausea.  He is not taking his antibiotics that we put him on prophylactically.  Hopefully he will be able to take these with his stomach feeling better.  Currently, his performance status is ECOG 1.        Medications:  Current Outpatient Medications:  .  cloNIDine (CATAPRES) 0.1 MG tablet, Take 0.1 mg by mouth 2 (two) times daily. , Disp: , Rfl:  .  cyclobenzaprine (FLEXERIL) 10 MG tablet, Take 1 tablet (10 mg total) by mouth 3 (three) times daily as needed for muscle spasms.,  Disp: 30 tablet, Rfl: 3 .  EPINEPHrine (EPIPEN 2-PAK) 0.3 mg/0.3 mL IJ SOAJ injection, USE AS DIRECTED FOR LIFE THREATENING ALLERGIC REACTIONS (Patient taking differently: Inject 0.3 mg into the muscle as needed for anaphylaxis. USE AS DIRECTED FOR LIFE THREATENING ALLERGIC REACTIONS), Disp: 4 Device, Rfl: 3 .  furosemide (LASIX) 80 MG tablet, Take 1 tablet (80 mg total) by mouth daily. (Patient taking differently: Take 80 mg by mouth daily as needed for fluid. ), Disp: 30 tablet, Rfl: 1 .  gabapentin (NEURONTIN) 300 MG capsule, Take 1 capsule (300 mg total) by mouth 3 (three) times daily., Disp: 90 capsule, Rfl: 2 .  levothyroxine (SYNTHROID) 200 MCG tablet, Take 1 tablet (200 mcg total) by mouth daily before breakfast., Disp: 90 tablet, Rfl: 3 .  levothyroxine (SYNTHROID) 75 MCG tablet, Take 75 mcg by mouth daily., Disp: , Rfl:  .  lidocaine-prilocaine (EMLA) cream, Apply to affected area once, Disp: 30 g, Rfl: 3 .  LORazepam (ATIVAN) 0.5 MG tablet, Take 1 tablet (0.5 mg total) by mouth every 6 (six) hours as needed (Nausea or vomiting)., Disp: 30 tablet, Rfl: 0 .  losartan (COZAAR) 25 MG tablet, Take 1 tablet (25 mg total) by mouth 2 (two) times  daily., Disp: 60 tablet, Rfl: 5 .  methylphenidate (RITALIN) 10 MG tablet, Take 1 tablet (10 mg total) by mouth 2 (two) times daily. (Patient taking differently: Take 10 mg by mouth daily. ), Disp: 60 tablet, Rfl: 0 .  metoCLOPramide (REGLAN) 10 MG tablet, TAKE 1 TABLET BY MOUTH FOUR TIMES DAILY BEFORE MEALS AND AT BEDTIME, Disp: 120 tablet, Rfl: 0 .  metolazone (ZAROXOLYN) 5 MG tablet, Take 1 tablet (5 mg total) by mouth daily. Take 1 hour before Lasix. (Patient taking differently: Take 5 mg by mouth daily as needed (leg swelling). Take 1 hour before Lasix.), Disp: 30 tablet, Rfl: 1 .  Multiple Vitamin (MULTIVITAMIN) capsule, Take 1 capsule by mouth daily., Disp: , Rfl:  .  ondansetron (ZOFRAN) 8 MG tablet, Take 8 mg by mouth every 8 (eight) hours as needed  for nausea or vomiting. , Disp: , Rfl:  .  potassium chloride SA (KLOR-CON) 10 MEQ tablet, Take 1 tablet (10 mEq total) by mouth daily. (Patient taking differently: Take 10 mEq by mouth daily as needed (takes with lasix). ), Disp: 30 tablet, Rfl: 1 .  prochlorperazine (COMPAZINE) 10 MG tablet, Take 1 tablet (10 mg total) by mouth every 6 (six) hours as needed (Nausea or vomiting)., Disp: 30 tablet, Rfl: 1 .  promethazine (PHENERGAN) 12.5 MG tablet, Take 12.5 mg by mouth at bedtime., Disp: , Rfl:  .  promethazine (PHENERGAN) 25 MG tablet, TAKE 1 TABLET BY MOUTH EVERY 6 HOURS AS NEEDED FOR NAUSEA & VOMITING, Disp: 120 tablet, Rfl: 0 .  TURMERIC CURCUMIN PO, Take 1 capsule by mouth daily., Disp: , Rfl:  .  dexamethasone (DECADRON) 4 MG tablet, Take 1 tablet (4 mg total) by mouth 2 (two) times daily with a meal. Take 2 pills twice a day for 4 days -- start day BEFORE each chemotherapy (Patient not taking: Reported on 09/20/2019), Disp: 60 tablet, Rfl: 1 .  isosorbide mononitrate (IMDUR) 60 MG 24 hr tablet, Take 1 tablet (60 mg total) by mouth daily., Disp: 90 tablet, Rfl: 3 .  levofloxacin (LEVAQUIN) 500 MG tablet, Take 1 tablet (500 mg total) by mouth daily. Take daily for 14 days -- start day AFTER each cehmotherapy (Patient not taking: Reported on 09/20/2019), Disp: 14 tablet, Rfl: 6 .  OLANZapine (ZYPREXA) 10 MG tablet, Take 1 tablet (10 mg total) by mouth at bedtime., Disp: 30 tablet, Rfl: 4 .  oxyCODONE (OXYCONTIN) 20 mg 12 hr tablet, Take 1 tablet (20 mg total) by mouth every 12 (twelve) hours., Disp: 60 tablet, Rfl: 0 .  Oxycodone HCl 10 MG TABS, Take 1 tablet (10 mg total) by mouth every 6 (six) hours as needed., Disp: 90 tablet, Rfl: 0 .  predniSONE (DELTASONE) 20 MG tablet, Take 1 tablet (20 mg total) by mouth daily with breakfast. (Patient not taking: Reported on 09/12/2019), Disp: 30 tablet, Rfl: 4 .  predniSONE (DELTASONE) 50 MG tablet, 13 HOUR PREP - TAKE 1 TABLET BY MOUTH 13 HOURS,7 HOURS,AND 1  HOUR PRIOR TO SCAN;THE SCAN SHOULD BE PERFORMED WITHIN 6 HOURS OF LAST DOSE (Patient not taking: Reported on 09/20/2019), Disp: , Rfl:  No current facility-administered medications for this visit.  Facility-Administered Medications Ordered in Other Visits:  .  0.9 %  sodium chloride infusion, , Intravenous, Continuous, Quavion Boule, Rudell Cobb, MD, Last Rate: 500 mL/hr at 09/20/19 1251, New Bag at 09/20/19 1251 .  HYDROmorphone (DILAUDID) injection 4 mg, 4 mg, Intravenous, Once, Kavaughn Faucett, Rudell Cobb, MD  Allergies:  Allergies  Allergen  Reactions  . Dextromethorphan-Guaifenesin Other (See Comments)    Irregular heartbeat   . Doxycycline Anaphylaxis, Nausea And Vomiting, Rash and Other (See Comments)    "heart arrythmia" and "dyspepsia" (only oral doxycycline causes reaction)  . Gadobutrol Hives and Other (See Comments)    Patient had MRI scan at [facility]. Patient called one hour after he left imaging facility to report two "blisters" that came up on "back" of lip.    . Gadolinium Derivatives Hives and Other (See Comments)    Patient had MRI scan at [place]. Patient called one hour after he left imaging facility to report two "blisters" that came up on "back" of lip.   . Guaifenesin Palpitations       . Ibuprofen Hives and Itching  . Lisinopril Other (See Comments)    Angioedema  . Pantoprazole Itching  . Tramadol Hives and Itching  . Barium Rash    Developed redness around neck after drinking 1st bottle of Barocat; pt was given Benedryl by ED Mds to be "on the safe side" before drinking 2nd bottle    Past Medical History, Surgical history, Social history, and Family History were reviewed and updated.  Review of Systems: Review of Systems  Constitutional: Negative.   HENT:   Positive for lump/mass and sore throat.   Eyes: Negative.   Respiratory: Negative.   Cardiovascular: Negative.   Gastrointestinal: Negative.   Endocrine: Negative.   Genitourinary: Negative.     Musculoskeletal: Negative.   Skin: Negative.   Neurological: Negative.   Hematological: Negative.   Psychiatric/Behavioral: Negative.     Physical Exam:  vitals were not taken for this visit.   Wt Readings from Last 3 Encounters:  09/06/19 269 lb (122 kg)  08/14/19 272 lb 4 oz (123.5 kg)  07/10/19 270 lb (122.5 kg)    Physical Exam Vitals reviewed.  HENT:     Head: Normocephalic and atraumatic.  Eyes:     Pupils: Pupils are equal, round, and reactive to light.  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  This is well-healing.  There is some slight fullness but no distinct mass noted in the neck. Cardiovascular:     Rate and Rhythm: Normal rate and regular rhythm.     Heart sounds: Normal heart sounds.  Pulmonary:     Effort: Pulmonary effort is normal.     Breath sounds: Normal breath sounds.  Abdominal:     General: Bowel sounds are normal.     Palpations: Abdomen is soft.  Musculoskeletal:        General: Swelling present. No tenderness or deformity. Normal range of motion.     Cervical back: Normal range of motion.  Lymphadenopathy:     Cervical: No cervical adenopathy.  Skin:    General: Skin is warm and dry.     Findings: No erythema or rash.  Neurological:     Mental Status: He is alert and oriented to person, place, and time.  Psychiatric:        Behavior: Behavior normal.        Thought Content: Thought content normal.        Judgment: Judgment normal.      Lab Results  Component Value Date   WBC 9.0 09/20/2019   HGB 14.4 09/20/2019   HCT 41.5 09/20/2019   MCV 90.8 09/20/2019   PLT 282 09/20/2019     Chemistry  Component Value Date/Time   NA 135 09/20/2019 1240   NA 141 02/06/2019 1210   K 3.9 09/20/2019 1240   CL 100 09/20/2019 1240   CO2 26 09/20/2019 1240   BUN 20 09/20/2019 1240   BUN 8 02/06/2019 1210   CREATININE 0.97 09/20/2019 1240      Component Value Date/Time    CALCIUM 9.3 09/20/2019 1240   ALKPHOS 114 09/20/2019 1240   AST 14 (L) 09/20/2019 1240   ALT 17 09/20/2019 1240   BILITOT 0.5 09/20/2019 1240      Impression and Plan: [name] is a 44 year old white male.  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  Again, his thyroid cancer is RET mutated.  We will move ahead with chemotherapy.  Maybe if we can get a response, we will then be able to see about any kind of surgery.  I would give him 2 cycles of Taxotere/cis-platinum and then repeat his MRI.  He will need G-CSF.  I probably will also need to get him on prophylactic antibiotics.  I went over side effects with him.  To me, the biggest side effect will be the neutropenia.  We will lose his hair.  He understands this.  We will see about getting the Port-A-Cath placed.  I will plan to get him back probably a week or so after treatment so we see how his blood counts look and see how his labs look and maybe he might need IV fluids.  I spent almost an hour with him today.     [name], MD 8/4/20213:54 PM

## 2019-09-20 NOTE — Patient Instructions (Signed)

## 2019-09-20 NOTE — Addendum Note (Signed)
Addended by: Burney Gauze R on: 09/20/2019 01:35 PM   Modules accepted: Orders

## 2019-09-20 NOTE — Telephone Encounter (Signed)
Message received from patient stating that his anti-emetics are not helping his nausea and would like a call back.  Dr. Marin Olp notified and message sent to scheduling for pt to come in now for anti-emetics and IVF's.  Pt states he needs to find a ride and will be here as soon as he can.

## 2019-09-21 ENCOUNTER — Telehealth: Payer: Self-pay | Admitting: Hematology & Oncology

## 2019-09-21 NOTE — Telephone Encounter (Signed)
No los 8/4

## 2019-09-25 ENCOUNTER — Other Ambulatory Visit: Payer: Self-pay | Admitting: Family

## 2019-09-26 ENCOUNTER — Other Ambulatory Visit: Payer: Self-pay

## 2019-09-26 ENCOUNTER — Other Ambulatory Visit: Payer: Self-pay | Admitting: *Deleted

## 2019-09-26 ENCOUNTER — Inpatient Hospital Stay: Payer: 59

## 2019-09-26 ENCOUNTER — Inpatient Hospital Stay (HOSPITAL_BASED_OUTPATIENT_CLINIC_OR_DEPARTMENT_OTHER): Payer: 59 | Admitting: Hematology & Oncology

## 2019-09-26 DIAGNOSIS — C73 Malignant neoplasm of thyroid gland: Secondary | ICD-10-CM | POA: Diagnosis not present

## 2019-09-26 DIAGNOSIS — C787 Secondary malignant neoplasm of liver and intrahepatic bile duct: Secondary | ICD-10-CM | POA: Diagnosis not present

## 2019-09-26 DIAGNOSIS — Z5111 Encounter for antineoplastic chemotherapy: Secondary | ICD-10-CM | POA: Diagnosis not present

## 2019-09-26 DIAGNOSIS — Z5189 Encounter for other specified aftercare: Secondary | ICD-10-CM | POA: Diagnosis not present

## 2019-09-26 LAB — BASIC METABOLIC PANEL - CANCER CENTER ONLY
Anion gap: 7 (ref 5–15)
BUN: 22 mg/dL — ABNORMAL HIGH (ref 6–20)
CO2: 27 mmol/L (ref 22–32)
Calcium: 9.8 mg/dL (ref 8.9–10.3)
Chloride: 101 mmol/L (ref 98–111)
Creatinine: 0.88 mg/dL (ref 0.61–1.24)
GFR, Est AFR Am: >60 mL/min (ref >60)
GFR, Estimated: >60 mL/min (ref >60)
Glucose, Bld: 122 mg/dL — ABNORMAL HIGH (ref 70–99)
Potassium: 4.2 mmol/L (ref 3.5–5.1)
Sodium: 135 mmol/L (ref 135–145)

## 2019-09-26 LAB — CBC WITH DIFFERENTIAL (CANCER CENTER ONLY)
Abs Immature Granulocytes: 2.83 10*3/uL — ABNORMAL HIGH (ref 0.00–0.07)
Basophils Absolute: 0.1 10*3/uL (ref 0.0–0.1)
Basophils Relative: 1 %
Eosinophils Absolute: 0 10*3/uL (ref 0.0–0.5)
Eosinophils Relative: 0 %
HCT: 37.3 % — ABNORMAL LOW (ref 39.0–52.0)
Hemoglobin: 12.5 g/dL — ABNORMAL LOW (ref 13.0–17.0)
Immature Granulocytes: 13 %
Lymphocytes Relative: 7 %
Lymphs Abs: 1.5 10*3/uL (ref 0.7–4.0)
MCH: 31.7 pg (ref 26.0–34.0)
MCHC: 33.5 g/dL (ref 30.0–36.0)
MCV: 94.7 fL (ref 80.0–100.0)
Monocytes Absolute: 1.1 10*3/uL — ABNORMAL HIGH (ref 0.1–1.0)
Monocytes Relative: 5 %
Neutro Abs: 16 10*3/uL — ABNORMAL HIGH (ref 1.7–7.7)
Neutrophils Relative %: 74 %
Platelet Count: 195 10*3/uL (ref 150–400)
RBC: 3.94 MIL/uL — ABNORMAL LOW (ref 4.22–5.81)
RDW: 14.3 % (ref 11.5–15.5)
WBC Count: 21.6 10*3/uL — ABNORMAL HIGH (ref 4.0–10.5)
nRBC: 0.6 % — ABNORMAL HIGH (ref 0.0–0.2)

## 2019-09-26 MED ORDER — SODIUM CHLORIDE 0.9 % IV SOLN
Freq: Once | INTRAVENOUS | Status: AC
  Filled 2019-09-26: qty 250

## 2019-09-26 MED ORDER — SODIUM CHLORIDE 0.9% FLUSH
10.0000 mL | Freq: Once | INTRAVENOUS | Status: AC
  Administered 2019-09-26: 10 mL via INTRAVENOUS
  Filled 2019-09-26: qty 10

## 2019-09-26 MED ORDER — HEPARIN SOD (PORK) LOCK FLUSH 100 UNIT/ML IV SOLN
500.0000 [IU] | Freq: Once | INTRAVENOUS | Status: AC
  Administered 2019-09-26: 500 [IU] via INTRAVENOUS
  Filled 2019-09-26: qty 5

## 2019-09-26 NOTE — Progress Notes (Signed)
Hematology and Oncology Follow Up Visit  Brian [name] [id number] 06-15-1975 44 y.o. 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  He has she looks a whole lot better than when we last saw him a week ago.  When we last saw him, he was just really down.  He was very weak.  He was not eating well.  He was dehydrated.  He was having a lot of bony pain, which may have been from the Neulasta.  When he came to the office, he got fluids.  He got antiemetics.  Got pain medication.  He said began to feel better that night.  Again he looks a whole lot better.  His "color" looks good.  There has been no problems with nausea or vomiting.  Has had no mouth sores.  He has had no leg swelling.  He has had no rashes.  He has had no diarrhea.  His abdominal pain might be a little bit better over on his right side.  His appetite is doing quite well.  He is pretty much eating what he would like.  He has had no fever.  There is no chest wall pain.  He has had no shortness of breath.  Overall, his performance status is ECOG 1.        Medications:  Current Outpatient Medications:  .  cloNIDine (CATAPRES) 0.1 MG tablet, Take 0.1 mg by mouth 2 (two) times daily. , Disp: , Rfl:  .  cyclobenzaprine (FLEXERIL) 10 MG tablet, Take 1 tablet (10 mg total) by mouth 3 (three) times daily as needed for muscle spasms., Disp: 30 tablet, Rfl: 3 .  dexamethasone (DECADRON) 4 MG tablet, Take 1 tablet (4 mg total) by mouth 2 (two) times daily with a meal. Take 2 pills twice a day for 4 days -- start day BEFORE each chemotherapy (Patient not taking: Reported on 09/20/2019), Disp: 60 tablet, Rfl: 1 .   EPINEPHrine (EPIPEN 2-PAK) 0.3 mg/0.3 mL IJ SOAJ injection, USE AS DIRECTED FOR LIFE THREATENING ALLERGIC REACTIONS (Patient taking differently: Inject 0.3 mg into the muscle as needed for anaphylaxis. USE AS DIRECTED FOR LIFE THREATENING ALLERGIC REACTIONS), Disp: 4 Device, Rfl: 3 .  furosemide (LASIX) 80 MG tablet, Take 1 tablet (80 mg total) by mouth daily. (Patient taking differently: Take 80 mg by mouth daily as needed for fluid. ), Disp: 30 tablet, Rfl: 1 .  gabapentin (NEURONTIN) 300 MG capsule, Take 1 capsule (300 mg total) by mouth 3 (three) times daily., Disp: 90 capsule, Rfl: 2 .  isosorbide mononitrate (IMDUR) 60 MG 24 hr tablet, Take 1 tablet (60 mg total) by mouth daily., Disp: 90 tablet, Rfl: 3 .  levofloxacin (LEVAQUIN) 500 MG tablet, Take 1 tablet (500 mg total) by mouth daily. Take daily for 14 days -- start day AFTER each cehmotherapy (Patient not taking: Reported on 09/20/2019), Disp: 14 tablet, Rfl: 6 .  levothyroxine (SYNTHROID) 200 MCG tablet, Take 1 tablet (200 mcg total) by mouth daily before breakfast., Disp: 90 tablet, Rfl: 3 .  levothyroxine (SYNTHROID) 75 MCG tablet, Take 75 mcg by mouth daily.,  Disp: , Rfl:  .  lidocaine-prilocaine (EMLA) cream, Apply to affected area once, Disp: 30 g, Rfl: 3 .  LORazepam (ATIVAN) 0.5 MG tablet, Take 1 tablet (0.5 mg total) by mouth every 6 (six) hours as needed (Nausea or vomiting)., Disp: 30 tablet, Rfl: 0 .  losartan (COZAAR) 25 MG tablet, Take 1 tablet (25 mg total) by mouth 2 (two) times daily., Disp: 60 tablet, Rfl: 5 .  methylphenidate (RITALIN) 10 MG tablet, Take 1 tablet (10 mg total) by mouth 2 (two) times daily. (Patient taking differently: Take 10 mg by mouth daily. ), Disp: 60 tablet, Rfl: 0 .  metoCLOPramide (REGLAN) 10 MG tablet, TAKE 1 TABLET BY MOUTH FOUR TIMES DAILY BEFORE MEALS AND AT BEDTIME, Disp: 120 tablet, Rfl: 0 .  metolazone (ZAROXOLYN) 5 MG tablet, Take 1 tablet (5 mg total) by mouth daily. Take 1 hour before Lasix.  (Patient taking differently: Take 5 mg by mouth daily as needed (leg swelling). Take 1 hour before Lasix.), Disp: 30 tablet, Rfl: 1 .  Multiple Vitamin (MULTIVITAMIN) capsule, Take 1 capsule by mouth daily., Disp: , Rfl:  .  OLANZapine (ZYPREXA) 10 MG tablet, Take 1 tablet (10 mg total) by mouth at bedtime., Disp: 30 tablet, Rfl: 4 .  ondansetron (ZOFRAN) 8 MG tablet, Take 8 mg by mouth every 8 (eight) hours as needed for nausea or vomiting. , Disp: , Rfl:  .  oxyCODONE (OXYCONTIN) 20 mg 12 hr tablet, Take 1 tablet (20 mg total) by mouth every 12 (twelve) hours., Disp: 60 tablet, Rfl: 0 .  Oxycodone HCl 10 MG TABS, Take 1 tablet (10 mg total) by mouth every 6 (six) hours as needed., Disp: 90 tablet, Rfl: 0 .  potassium chloride SA (KLOR-CON) 10 MEQ tablet, Take 1 tablet (10 mEq total) by mouth daily. (Patient taking differently: Take 10 mEq by mouth daily as needed (takes with lasix). ), Disp: 30 tablet, Rfl: 1 .  predniSONE (DELTASONE) 20 MG tablet, Take 1 tablet (20 mg total) by mouth daily with breakfast. (Patient not taking: Reported on 09/12/2019), Disp: 30 tablet, Rfl: 4 .  predniSONE (DELTASONE) 50 MG tablet, 13 HOUR PREP - TAKE 1 TABLET BY MOUTH 13 HOURS,7 HOURS,AND 1 HOUR PRIOR TO SCAN;THE SCAN SHOULD BE PERFORMED WITHIN 6 HOURS OF LAST DOSE (Patient not taking: Reported on 09/20/2019), Disp: , Rfl:  .  prochlorperazine (COMPAZINE) 10 MG tablet, Take 1 tablet (10 mg total) by mouth every 6 (six) hours as needed (Nausea or vomiting)., Disp: 30 tablet, Rfl: 1 .  promethazine (PHENERGAN) 12.5 MG tablet, Take 12.5 mg by mouth at bedtime., Disp: , Rfl:  .  promethazine (PHENERGAN) 25 MG tablet, TAKE 1 TABLET BY MOUTH EVERY 6 HOURS AS NEEDED FOR NAUSEA & VOMITING, Disp: 120 tablet, Rfl: 0 .  TURMERIC CURCUMIN PO, Take 1 capsule by mouth daily., Disp: , Rfl:  No current facility-administered medications for this visit.  Facility-Administered Medications Ordered in Other Visits:  .  HYDROmorphone  (DILAUDID) injection 4 mg, 4 mg, Intravenous, Once, Katricia Prehn, Rudell Cobb, MD  Allergies:  Allergies  Allergen Reactions  . Dextromethorphan-Guaifenesin Other (See Comments)    Irregular heartbeat   . Doxycycline Anaphylaxis, Nausea And Vomiting, Rash and Other (See Comments)    "heart arrythmia" and "dyspepsia" (only oral doxycycline causes reaction)  . Gadobutrol Hives and Other (See Comments)    Patient had MRI scan at [place]. Patient called one hour after he left imaging facility to report two "blisters" that came up  on "back" of lip.    . Gadolinium Derivatives Hives and Other (See Comments)    Patient had MRI scan at [place]. Patient called one hour after he left imaging facility to report two "blisters" that came up on "back" of lip.   . Guaifenesin Palpitations       . Ibuprofen Hives and Itching  . Lisinopril Other (See Comments)    Angioedema  . Pantoprazole Itching  . Tramadol Hives and Itching  . Barium Rash    Developed redness around neck after drinking 1st bottle of Barocat; pt was given Benedryl by ED Mds to be "on the safe side" before drinking 2nd bottle    Past Medical History, Surgical history, Social history, and Family History were reviewed and updated.  Review of Systems: Review of Systems  Constitutional: Negative.   HENT:   Positive for lump/mass and sore throat.   Eyes: Negative.   Respiratory: Negative.   Cardiovascular: Negative.   Gastrointestinal: Negative.   Endocrine: Negative.   Genitourinary: Negative.    Musculoskeletal: Negative.   Skin: Negative.   Neurological: Negative.   Hematological: Negative.   Psychiatric/Behavioral: Negative.     Physical Exam:  vitals were not taken for this visit.   Wt Readings from Last 3 Encounters:  09/06/19 269 lb (122 kg)  08/14/19 272 lb 4 oz (123.5 kg)  07/10/19 270 lb (122.5 kg)   His vital signs show temperature of 98 7.  Pulse 89.  Blood pressure 141/87.  Weight was not  taken.  Physical Exam Vitals reviewed.  HENT:     Head: Normocephalic and atraumatic.  Eyes:     Pupils: Pupils are equal, round, and reactive to light.  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  This is well-healing.  There is some slight fullness but no distinct mass noted in the neck. Cardiovascular:     Rate and Rhythm: Normal rate and regular rhythm.     Heart sounds: Normal heart sounds.  Pulmonary:     Effort: Pulmonary effort is normal.     Breath sounds: Normal breath sounds.  Abdominal:     General: Bowel sounds are normal.     Palpations: Abdomen is soft.  Musculoskeletal:        General: Swelling present. No tenderness or deformity. Normal range of motion.     Cervical back: Normal range of motion.  Lymphadenopathy:     Cervical: No cervical adenopathy.  Skin:    General: Skin is warm and dry.     Findings: No erythema or rash.  Neurological:     Mental Status: He is alert and oriented to person, place, and time.  Psychiatric:        Behavior: Behavior normal.        Thought Content: Thought content normal.        Judgment: Judgment normal.      Lab Results  Component Value Date   WBC 9.0 09/20/2019   HGB 14.4 09/20/2019   HCT 41.5 09/20/2019   MCV 90.8 09/20/2019   PLT 282 09/20/2019     Chemistry      Component Value Date/Time   NA 135 09/20/2019 1240   NA 141 02/06/2019 1210   K 3.9 09/20/2019 1240   CL 100 09/20/2019 1240   CO2 26 09/20/2019 1240   BUN 20 09/20/2019 1240   BUN 8 02/06/2019 1210   CREATININE  0.97 09/20/2019 1240      Component Value Date/Time   CALCIUM 9.3 09/20/2019 1240   ALKPHOS 114 09/20/2019 1240   AST 14 (L) 09/20/2019 1240   ALT 17 09/20/2019 1240   BILITOT 0.5 09/20/2019 1240      Impression and Plan: Brian [name] is a 44 year old white male.  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  Again, his thyroid cancer is RET  mutated.  Hopefully, chemotherapy is doing some force.  It is still too early for [place] to really know this.  We will get him back next week for his second cycle of treatment.  I will then plan for a follow-up MRI and to see how everything looks.  We may have to make sure that he gets some Claritin before he takes the Neulasta.  This might help with some of the arthralgias that he had.  We will also get him set up with IV fluids prophylactically after treatment so that he does not get dehydrated.  I am just happy that his quality of life is doing better.     [name], MD 8/10/20219:06 AM

## 2019-09-26 NOTE — Patient Instructions (Signed)

## 2019-09-26 NOTE — Patient Instructions (Signed)
Implanted Port Insertion, Care After °This sheet gives you information about how to care for yourself after your procedure. Your health care provider may also give you more specific instructions. If you have problems or questions, contact your health care provider. °What can I expect after the procedure? °After the procedure, it is common to have: °· Discomfort at the port insertion site. °· Bruising on the skin over the port. This should improve over 3-4 days. °Follow these instructions at home: °Port care °· After your port is placed, you will get a manufacturer's information card. The card has information about your port. Keep this card with you at all times. °· Take care of the port as told by your health care provider. Ask your health care provider if you or a family member can get training for taking care of the port at home. A home health care nurse may also take care of the port. °· Make sure to remember what type of port you have. °Incision care ° °  ° °· Follow instructions from your health care provider about how to take care of your port insertion site. Make sure you: °? Wash your hands with soap and water before and after you change your bandage (dressing). If soap and water are not available, use hand sanitizer. °? Change your dressing as told by your health care provider. °? Leave stitches (sutures), skin glue, or adhesive strips in place. These skin closures may need to stay in place for 2 weeks or longer. If adhesive strip edges start to loosen and curl up, you may trim the loose edges. Do not remove adhesive strips completely unless your health care provider tells you to do that. °· Check your port insertion site every day for signs of infection. Check for: °? Redness, swelling, or pain. °? Fluid or blood. °? Warmth. °? Pus or a bad smell. °Activity °· Return to your normal activities as told by your health care provider. Ask your health care provider what activities are safe for you. °· Do not  lift anything that is heavier than 10 lb (4.5 kg), or the limit that you are told, until your health care provider says that it is safe. °General instructions °· Take over-the-counter and prescription medicines only as told by your health care provider. °· Do not take baths, swim, or use a hot tub until your health care provider approves. Ask your health care provider if you may take showers. You may only be allowed to take sponge baths. °· Do not drive for 24 hours if you were given a sedative during your procedure. °· Wear a medical alert bracelet in case of an emergency. This will tell any health care providers that you have a port. °· Keep all follow-up visits as told by your health care provider. This is important. °Contact a health care provider if: °· You cannot flush your port with saline as directed, or you cannot draw blood from the port. °· You have a fever or chills. °· You have redness, swelling, or pain around your port insertion site. °· You have fluid or blood coming from your port insertion site. °· Your port insertion site feels warm to the touch. °· You have pus or a bad smell coming from the port insertion site. °Get help right away if: °· You have chest pain or shortness of breath. °· You have bleeding from your port that you cannot control. °Summary °· Take care of the port as told by your health   care provider. Keep the manufacturer's information card with you at all times. °· Change your dressing as told by your health care provider. °· Contact a health care provider if you have a fever or chills or if you have redness, swelling, or pain around your port insertion site. °· Keep all follow-up visits as told by your health care provider. °This information is not intended to replace advice given to you by your health care provider. Make sure you discuss any questions you have with your health care provider. °Document Revised: 08/31/2017 Document Reviewed: 08/31/2017 °Elsevier Patient Education ©  2020 Elsevier Inc. ° °

## 2019-09-26 NOTE — Patient Outreach (Addendum)
[facility]) Care Management  09/26/2019  Brian [name] Dec 07, 1975 [id number]   Case Closure  Referral received: 09/11/19 Referral reason : Transition of Care.  Insurance : UMR   Referral received on 7/26 for patient to admit for surgery at [facility] .  Per electronic record no surgery planned at this time     Plan Will close case at this time no transition of care needs .    Joylene Draft, RN, BSN  [place] Management Coordinator  [phone]- Mobile [phone]- Toll Free Main Office

## 2019-09-27 LAB — THYROID PANEL WITH TSH
Free Thyroxine Index: 2.4 (ref 1.2–4.9)
T3 Uptake Ratio: 31 % (ref 24–39)
T4, Total: 7.9 ug/dL (ref 4.5–12.0)
TSH: 1.51 u[IU]/mL (ref 0.450–4.500)

## 2019-10-02 ENCOUNTER — Other Ambulatory Visit: Payer: Self-pay | Admitting: *Deleted

## 2019-10-02 DIAGNOSIS — C73 Malignant neoplasm of thyroid gland: Secondary | ICD-10-CM

## 2019-10-03 ENCOUNTER — Other Ambulatory Visit: Payer: Self-pay

## 2019-10-03 ENCOUNTER — Telehealth: Payer: Self-pay | Admitting: Hematology & Oncology

## 2019-10-03 ENCOUNTER — Encounter: Payer: Self-pay | Admitting: Hematology & Oncology

## 2019-10-03 ENCOUNTER — Inpatient Hospital Stay: Payer: 59

## 2019-10-03 ENCOUNTER — Inpatient Hospital Stay (HOSPITAL_BASED_OUTPATIENT_CLINIC_OR_DEPARTMENT_OTHER): Payer: 59 | Admitting: Hematology & Oncology

## 2019-10-03 VITALS — BP 131/85 | HR 78 | Temp 98.4°F | Resp 18

## 2019-10-03 DIAGNOSIS — C787 Secondary malignant neoplasm of liver and intrahepatic bile duct: Secondary | ICD-10-CM | POA: Diagnosis not present

## 2019-10-03 DIAGNOSIS — C73 Malignant neoplasm of thyroid gland: Secondary | ICD-10-CM

## 2019-10-03 DIAGNOSIS — Z5189 Encounter for other specified aftercare: Secondary | ICD-10-CM | POA: Diagnosis not present

## 2019-10-03 DIAGNOSIS — Z5111 Encounter for antineoplastic chemotherapy: Secondary | ICD-10-CM | POA: Diagnosis not present

## 2019-10-03 LAB — COMPREHENSIVE METABOLIC PANEL
ALT: 19 U/L (ref 0–44)
AST: 14 U/L — ABNORMAL LOW (ref 15–41)
Albumin: 3.9 g/dL (ref 3.5–5.0)
Alkaline Phosphatase: 68 U/L (ref 38–126)
Anion gap: 8 (ref 5–15)
BUN: 26 mg/dL — ABNORMAL HIGH (ref 6–20)
CO2: 29 mmol/L (ref 22–32)
Calcium: 9.5 mg/dL (ref 8.9–10.3)
Chloride: 95 mmol/L — ABNORMAL LOW (ref 98–111)
Creatinine, Ser: 0.85 mg/dL (ref 0.61–1.24)
GFR calc Af Amer: >60 mL/min (ref >60)
GFR calc non Af Amer: >60 mL/min (ref >60)
Glucose, Bld: 136 mg/dL — ABNORMAL HIGH (ref 70–99)
Potassium: 4.4 mmol/L (ref 3.5–5.1)
Sodium: 132 mmol/L — ABNORMAL LOW (ref 135–145)
Total Bilirubin: 0.5 mg/dL (ref 0.3–1.2)
Total Protein: 5.5 g/dL — ABNORMAL LOW (ref 6.5–8.1)

## 2019-10-03 LAB — CBC WITH DIFFERENTIAL (CANCER CENTER ONLY)
Abs Immature Granulocytes: 0.82 10*3/uL — ABNORMAL HIGH (ref 0.00–0.07)
Basophils Absolute: 0 10*3/uL (ref 0.0–0.1)
Basophils Relative: 0 %
Eosinophils Absolute: 0 10*3/uL (ref 0.0–0.5)
Eosinophils Relative: 0 %
HCT: 36.4 % — ABNORMAL LOW (ref 39.0–52.0)
Hemoglobin: 12.3 g/dL — ABNORMAL LOW (ref 13.0–17.0)
Immature Granulocytes: 6 %
Lymphocytes Relative: 8 %
Lymphs Abs: 1.1 10*3/uL (ref 0.7–4.0)
MCH: 32.5 pg (ref 26.0–34.0)
MCHC: 33.8 g/dL (ref 30.0–36.0)
MCV: 96.3 fL (ref 80.0–100.0)
Monocytes Absolute: 0.9 10*3/uL (ref 0.1–1.0)
Monocytes Relative: 6 %
Neutro Abs: 11.7 10*3/uL — ABNORMAL HIGH (ref 1.7–7.7)
Neutrophils Relative %: 80 %
Platelet Count: 161 10*3/uL (ref 150–400)
RBC: 3.78 MIL/uL — ABNORMAL LOW (ref 4.22–5.81)
RDW: 15.9 % — ABNORMAL HIGH (ref 11.5–15.5)
WBC Count: 14.6 10*3/uL — ABNORMAL HIGH (ref 4.0–10.5)
nRBC: 0.3 % — ABNORMAL HIGH (ref 0.0–0.2)

## 2019-10-03 MED ORDER — HEPARIN SOD (PORK) LOCK FLUSH 100 UNIT/ML IV SOLN
500.0000 [IU] | Freq: Once | INTRAVENOUS | Status: AC | PRN
  Administered 2019-10-03: 500 [IU]
  Filled 2019-10-03: qty 5

## 2019-10-03 MED ORDER — SODIUM CHLORIDE 0.9 % IV SOLN
Freq: Once | INTRAVENOUS | Status: AC
  Filled 2019-10-03: qty 250

## 2019-10-03 MED ORDER — SODIUM CHLORIDE 0.9% FLUSH
10.0000 mL | INTRAVENOUS | Status: DC | PRN
  Administered 2019-10-03: 10 mL
  Filled 2019-10-03: qty 10

## 2019-10-03 MED ORDER — PALONOSETRON HCL INJECTION 0.25 MG/5ML
INTRAVENOUS | Status: AC
  Filled 2019-10-03: qty 5

## 2019-10-03 MED ORDER — SODIUM CHLORIDE 0.9 % IV SOLN
75.0000 mg/m2 | Freq: Once | INTRAVENOUS | Status: AC
  Administered 2019-10-03: 180 mg via INTRAVENOUS
  Filled 2019-10-03: qty 18

## 2019-10-03 MED ORDER — SODIUM CHLORIDE 0.9 % IV SOLN
Freq: Once | INTRAVENOUS | Status: AC
  Filled 2019-10-03: qty 10

## 2019-10-03 MED ORDER — PALONOSETRON HCL INJECTION 0.25 MG/5ML
0.2500 mg | Freq: Once | INTRAVENOUS | Status: AC
  Administered 2019-10-03: 0.25 mg via INTRAVENOUS

## 2019-10-03 MED ORDER — SODIUM CHLORIDE 0.9 % IV SOLN
150.0000 mg | Freq: Once | INTRAVENOUS | Status: AC
  Administered 2019-10-03: 150 mg via INTRAVENOUS
  Filled 2019-10-03: qty 150

## 2019-10-03 MED ORDER — SODIUM CHLORIDE 0.9 % IV SOLN
10.0000 mg | Freq: Once | INTRAVENOUS | Status: AC
  Administered 2019-10-03: 10 mg via INTRAVENOUS
  Filled 2019-10-03: qty 10

## 2019-10-03 MED ORDER — SODIUM CHLORIDE 0.9 % IV SOLN
75.0000 mg/m2 | Freq: Once | INTRAVENOUS | Status: AC
  Administered 2019-10-03: 184 mg via INTRAVENOUS
  Filled 2019-10-03: qty 184

## 2019-10-03 NOTE — Progress Notes (Signed)
Ok to infuse pre/post CDDP fluids over one hour today per Dr Marin Olp.

## 2019-10-03 NOTE — Telephone Encounter (Signed)
Appointments scheduled patient to get updates from My Chart per 8/17 los

## 2019-10-03 NOTE — Patient Instructions (Signed)
Implanted Port Insertion, Care After °This sheet gives you information about how to care for yourself after your procedure. Your health care provider may also give you more specific instructions. If you have problems or questions, contact your health care provider. °What can I expect after the procedure? °After the procedure, it is common to have: °· Discomfort at the port insertion site. °· Bruising on the skin over the port. This should improve over 3-4 days. °Follow these instructions at home: °Port care °· After your port is placed, you will get a manufacturer's information card. The card has information about your port. Keep this card with you at all times. °· Take care of the port as told by your health care provider. Ask your health care provider if you or a family member can get training for taking care of the port at home. A home health care nurse may also take care of the port. °· Make sure to remember what type of port you have. °Incision care ° °  ° °· Follow instructions from your health care provider about how to take care of your port insertion site. Make sure you: °? Wash your hands with soap and water before and after you change your bandage (dressing). If soap and water are not available, use hand sanitizer. °? Change your dressing as told by your health care provider. °? Leave stitches (sutures), skin glue, or adhesive strips in place. These skin closures may need to stay in place for 2 weeks or longer. If adhesive strip edges start to loosen and curl up, you may trim the loose edges. Do not remove adhesive strips completely unless your health care provider tells you to do that. °· Check your port insertion site every day for signs of infection. Check for: °? Redness, swelling, or pain. °? Fluid or blood. °? Warmth. °? Pus or a bad smell. °Activity °· Return to your normal activities as told by your health care provider. Ask your health care provider what activities are safe for you. °· Do not  lift anything that is heavier than 10 lb (4.5 kg), or the limit that you are told, until your health care provider says that it is safe. °General instructions °· Take over-the-counter and prescription medicines only as told by your health care provider. °· Do not take baths, swim, or use a hot tub until your health care provider approves. Ask your health care provider if you may take showers. You may only be allowed to take sponge baths. °· Do not drive for 24 hours if you were given a sedative during your procedure. °· Wear a medical alert bracelet in case of an emergency. This will tell any health care providers that you have a port. °· Keep all follow-up visits as told by your health care provider. This is important. °Contact a health care provider if: °· You cannot flush your port with saline as directed, or you cannot draw blood from the port. °· You have a fever or chills. °· You have redness, swelling, or pain around your port insertion site. °· You have fluid or blood coming from your port insertion site. °· Your port insertion site feels warm to the touch. °· You have pus or a bad smell coming from the port insertion site. °Get help right away if: °· You have chest pain or shortness of breath. °· You have bleeding from your port that you cannot control. °Summary °· Take care of the port as told by your health   care provider. Keep the manufacturer's information card with you at all times. °· Change your dressing as told by your health care provider. °· Contact a health care provider if you have a fever or chills or if you have redness, swelling, or pain around your port insertion site. °· Keep all follow-up visits as told by your health care provider. °This information is not intended to replace advice given to you by your health care provider. Make sure you discuss any questions you have with your health care provider. °Document Revised: 08/31/2017 Document Reviewed: 08/31/2017 °Elsevier Patient Education ©  2020 Elsevier Inc. ° °

## 2019-10-03 NOTE — Patient Instructions (Signed)
Strong Discharge Instructions for Patients Receiving Chemotherapy  Today you received the following chemotherapy agents:  Taxotere and Cisplatin  To help prevent nausea and vomiting after your treatment, we encourage you to take your nausea medication as ordered per MD.    If you develop nausea and vomiting that is not controlled by your nausea medication, call the clinic.   BELOW ARE SYMPTOMS THAT SHOULD BE REPORTED IMMEDIATELY:  *FEVER GREATER THAN 100.5 F  *CHILLS WITH OR WITHOUT FEVER  NAUSEA AND VOMITING THAT IS NOT CONTROLLED WITH YOUR NAUSEA MEDICATION  *UNUSUAL SHORTNESS OF BREATH  *UNUSUAL BRUISING OR BLEEDING  TENDERNESS IN MOUTH AND THROAT WITH OR WITHOUT PRESENCE OF ULCERS  *URINARY PROBLEMS  *BOWEL PROBLEMS  UNUSUAL RASH Items with * indicate a potential emergency and should be followed up as soon as possible.  Feel free to call the clinic should you have any questions or concerns. The clinic phone number is [phone].  Please show the [place] at check-in to the Emergency Department and triage nurse.

## 2019-10-03 NOTE — Progress Notes (Signed)
Pt voided this am prior to coming. Per MD- ok to proceed with treatment using pt's void this am as sufficient output.
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
## 2019-10-04 ENCOUNTER — Inpatient Hospital Stay: Payer: 59

## 2019-10-05 ENCOUNTER — Other Ambulatory Visit: Payer: Self-pay | Admitting: *Deleted

## 2019-10-05 ENCOUNTER — Inpatient Hospital Stay: Payer: 59

## 2019-10-05 ENCOUNTER — Telehealth: Payer: Self-pay | Admitting: *Deleted

## 2019-10-05 ENCOUNTER — Telehealth: Payer: Self-pay | Admitting: Hematology & Oncology

## 2019-10-05 MED ORDER — SUCRALFATE 1 GM/10ML PO SUSP
1.0000 g | Freq: Three times a day (TID) | ORAL | 1 refills | Status: DC
Start: 2019-10-05 — End: 2020-04-15

## 2019-10-05 NOTE — Telephone Encounter (Signed)
Message received from scheduler to call pt regarding pt.'s complaints of increased hiccups and heartburn.  Pt states that he has painful hiccups with burning sensation from his abdomen to his mouth.  Dr. Marin Olp notified and order received for pt to take Carafate 10 ml. PO QID.  Pt notified and would like prescription sent to the [facility] in [place].  Pt appreciative of Dr. Antonieta Pert assistance and has no further concerns at this time.

## 2019-10-05 NOTE — Telephone Encounter (Signed)
Faxed medical records to: [facility] F: [phone] P: [phone]    CN [phone]

## 2019-10-06 ENCOUNTER — Telehealth: Payer: Self-pay | Admitting: *Deleted

## 2019-10-06 ENCOUNTER — Other Ambulatory Visit: Payer: Self-pay | Admitting: *Deleted

## 2019-10-06 ENCOUNTER — Ambulatory Visit: Payer: 59

## 2019-10-06 ENCOUNTER — Inpatient Hospital Stay: Payer: 59

## 2019-10-06 DIAGNOSIS — C73 Malignant neoplasm of thyroid gland: Secondary | ICD-10-CM

## 2019-10-06 DIAGNOSIS — Z95828 Presence of other vascular implants and grafts: Secondary | ICD-10-CM

## 2019-10-06 DIAGNOSIS — Z5189 Encounter for other specified aftercare: Secondary | ICD-10-CM | POA: Diagnosis not present

## 2019-10-06 DIAGNOSIS — Z5111 Encounter for antineoplastic chemotherapy: Secondary | ICD-10-CM | POA: Diagnosis not present

## 2019-10-06 DIAGNOSIS — C787 Secondary malignant neoplasm of liver and intrahepatic bile duct: Secondary | ICD-10-CM | POA: Diagnosis not present

## 2019-10-06 MED ORDER — SODIUM CHLORIDE 0.9 % IV SOLN
40.0000 mg | Freq: Once | INTRAVENOUS | Status: AC
  Administered 2019-10-06: 40 mg via INTRAVENOUS
  Filled 2019-10-06: qty 4

## 2019-10-06 MED ORDER — SODIUM CHLORIDE 0.9 % IV SOLN
Freq: Once | INTRAVENOUS | Status: AC
  Filled 2019-10-06: qty 250

## 2019-10-06 MED ORDER — HEPARIN SOD (PORK) LOCK FLUSH 100 UNIT/ML IV SOLN
500.0000 [IU] | Freq: Once | INTRAVENOUS | Status: AC
  Administered 2019-10-06: 500 [IU] via INTRAVENOUS
  Filled 2019-10-06: qty 5

## 2019-10-06 MED ORDER — FLUCONAZOLE 200 MG PO TABS: 200.0000 mg | ORAL_TABLET | Freq: Every day | ORAL | 4 refills | Status: DC

## 2019-10-06 MED ORDER — MAGIC MOUTHWASH: 10.0000 mL | Freq: Four times a day (QID) | ORAL | 1 refills | Status: DC | PRN

## 2019-10-06 MED ORDER — PROMETHAZINE HCL 25 MG/ML IJ SOLN: 12.5000 mg | Freq: Once | INTRAMUSCULAR | Status: DC

## 2019-10-06 MED ORDER — SODIUM CHLORIDE 0.9% FLUSH
10.0000 mL | Freq: Once | INTRAVENOUS | Status: AC
  Administered 2019-10-06: 10 mL via INTRAVENOUS
  Filled 2019-10-06: qty 10

## 2019-10-06 MED ORDER — SODIUM CHLORIDE 0.9 % IV SOLN
Freq: Once | INTRAVENOUS | Status: DC
  Filled 2019-10-06: qty 250

## 2019-10-06 MED ORDER — PEGFILGRASTIM-JMDB 6 MG/0.6ML ~~LOC~~ SOSY
PREFILLED_SYRINGE | SUBCUTANEOUS | Status: AC
  Filled 2019-10-06: qty 0.6

## 2019-10-06 MED ORDER — BACLOFEN 10 MG PO TABS: 10.0000 mg | ORAL_TABLET | Freq: Three times a day (TID) | ORAL | 0 refills | Status: DC

## 2019-10-06 MED ORDER — PEGFILGRASTIM-JMDB 6 MG/0.6ML ~~LOC~~ SOSY
6.0000 mg | PREFILLED_SYRINGE | Freq: Once | SUBCUTANEOUS | Status: AC
  Administered 2019-10-06: 6 mg via SUBCUTANEOUS

## 2019-10-06 NOTE — Patient Instructions (Signed)
Dehydration, Adult Dehydration is a condition in which there is not enough water or other fluids in the body. This happens when a person loses more fluids than he or she takes in. Important organs, such as the kidneys, brain, and heart, cannot function without a proper amount of fluids. Any loss of fluids from the body can lead to dehydration. Dehydration can be mild, moderate, or severe. It should be treated right away to prevent it from becoming severe. What are the causes? Dehydration may be caused by:  Conditions that cause loss of water or other fluids, such as diarrhea, vomiting, or sweating or urinating a lot.  Not drinking enough fluids, especially when you are ill or doing activities that require a lot of energy.  Other illnesses and conditions, such as fever or infection.  Certain medicines, such as medicines that remove excess fluid from the body (diuretics).  Lack of safe drinking water.  Not being able to get enough water and food. What increases the risk? The following factors may make you more likely to develop this condition:  Having a long-term (chronic) illness that has not been treated properly, such as diabetes, heart disease, or kidney disease.  Being 65 years of age or older.  Having a disability.  Living in a place that is high in altitude, where thinner, drier air causes more fluid loss.  Doing exercises that put stress on your body for a long time (endurance sports). What are the signs or symptoms? Symptoms of dehydration depend on how severe it is. Mild or moderate dehydration  Thirst.  Dry lips or dry mouth.  Dizziness or light-headedness, especially when standing up from a seated position.  Muscle cramps.  Dark urine. Urine may be the color of tea.  Less urine or tears produced than usual.  Headache. Severe dehydration  Changes in skin. Your skin may be cold and clammy, blotchy, or pale. Your skin also may not return to normal after being  lightly pinched and released.  Little or no tears, urine, or sweat.  Changes in vital signs, such as rapid breathing and low blood pressure. Your pulse may be weak or may be faster than 100 beats a minute when you are sitting still.  Other changes, such as: ? Feeling very thirsty. ? Sunken eyes. ? Cold hands and feet. ? Confusion. ? Being very tired (lethargic) or having trouble waking from sleep. ? Short-term weight loss. ? Loss of consciousness. How is this diagnosed? This condition is diagnosed based on your symptoms and a physical exam. You may have blood and urine tests to help confirm the diagnosis. How is this treated? Treatment for this condition depends on how severe it is. Treatment should be started right away. Do not wait until dehydration becomes severe. Severe dehydration is an emergency and needs to be treated in a hospital.  Mild or moderate dehydration can be treated at home. You may be asked to: ? Drink more fluids. ? Drink an oral rehydration solution (ORS). This drink helps restore proper amounts of fluids and salts and minerals in the blood (electrolytes).  Severe dehydration can be treated: ? With IV fluids. ? By correcting abnormal levels of electrolytes. This is often done by giving electrolytes through a tube that is passed through your nose and into your stomach (nasogastric tube, or NG tube). ? By treating the underlying cause of dehydration. Follow these instructions at home: Oral rehydration solution If told by your health care provider, drink an ORS:  Make   an ORS by following instructions on the package.  Start by drinking small amounts, about  cup (120 mL) every 5-10 minutes.  Slowly increase how much you drink until you have taken the amount recommended by your health care provider. Eating and drinking         Drink enough clear fluid to keep your urine pale yellow. If you were told to drink an ORS, finish the ORS first and then start slowly  drinking other clear fluids. Drink fluids such as: ? Water. Do not drink only water. Doing that can lead to hyponatremia, which is having too little salt (sodium) in the body. ? Water from ice chips you suck on. ? Fruit juice that you have added water to (diluted fruit juice). ? Low-calorie sports drinks.  Eat foods that contain a healthy balance of electrolytes, such as bananas, oranges, potatoes, tomatoes, and spinach.  Do not drink alcohol.  Avoid the following: ? Drinks that contain a lot of sugar. These include high-calorie sports drinks, fruit juice that is not diluted, and soda. ? Caffeine. ? Foods that are greasy or contain a lot of fat or sugar. General instructions  Take over-the-counter and prescription medicines only as told by your health care provider.  Do not take sodium tablets. Doing that can lead to having too much sodium in the body (hypernatremia).  Return to your normal activities as told by your health care provider. Ask your health care provider what activities are safe for you.  Keep all follow-up visits as told by your health care provider. This is important. Contact a health care provider if:  You have muscle cramps, pain, or discomfort, such as: ? Pain in your abdomen and the pain gets worse or stays in one area (localizes). ? Stiff neck.  You have a rash.  You are more irritable than usual.  You are sleepier or have a harder time waking than usual.  You feel weak or dizzy.  You feel very thirsty. Get help right away if you have:  Any symptoms of severe dehydration.  Symptoms of vomiting, such as: ? You cannot eat or drink without vomiting. ? Vomiting gets worse or does not go away. ? Vomit includes blood or green matter (bile).  Symptoms that get worse with treatment.  A fever.  A severe headache.  Problems with urination or bowel movements, such as: ? Diarrhea that gets worse or does not go away. ? Blood in your stool (feces). This  may cause stool to look black and tarry. ? Not urinating, or urinating only a small amount of very dark urine, within 6-8 hours.  Trouble breathing. These symptoms may represent a serious problem that is an emergency. Do not wait to see if the symptoms will go away. Get medical help right away. Call your local emergency services (911 in the [place] Do not drive yourself to the hospital. Summary  Dehydration is a condition in which there is not enough water or other fluids in the body. This happens when a person loses more fluids than he or she takes in.  Treatment for this condition depends on how severe it is. Treatment should be started right away. Do not wait until dehydration becomes severe.  Drink enough clear fluid to keep your urine pale yellow. If you were told to drink an oral rehydration solution (ORS), finish the ORS first and then start slowly drinking other clear fluids.  Take over-the-counter and prescription medicines only as told by your health care   provider.  Get help right away if you have any symptoms of severe dehydration. This information is not intended to replace advice given to you by your health care provider. Make sure you discuss any questions you have with your health care provider. Document Revised: 09/15/2018 Document Reviewed: 09/15/2018 Elsevier Patient Education  2020 Elsevier Inc.   

## 2019-10-06 NOTE — Telephone Encounter (Signed)
Message received from patient stating that on his drive home from this office today, he noticed that his mouth is red with white patches and is requesting MM and Diflucan.  Dr. Marin Olp notified.  Call placed back to patient and patient notified per order of Dr. Marin Olp that prescription for Diflucan and MM will be sent in per his request.  Pt appreciative of call back and has no further questions at this time.

## 2019-10-12 ENCOUNTER — Other Ambulatory Visit: Payer: Self-pay | Admitting: *Deleted

## 2019-10-12 ENCOUNTER — Inpatient Hospital Stay: Payer: 59

## 2019-10-12 ENCOUNTER — Ambulatory Visit: Payer: 59

## 2019-10-12 ENCOUNTER — Other Ambulatory Visit: Payer: Self-pay

## 2019-10-12 DIAGNOSIS — C73 Malignant neoplasm of thyroid gland: Secondary | ICD-10-CM

## 2019-10-12 MED ORDER — PREDNISONE 50 MG PO TABS: ORAL_TABLET | ORAL | 0 refills | Status: DC

## 2019-10-12 MED ORDER — PROMETHAZINE HCL 12.5 MG PO TABS
12.5000 mg | ORAL_TABLET | Freq: Every day | ORAL | 0 refills | Status: DC
Start: 2019-10-12 — End: 2020-01-13

## 2019-10-12 MED ORDER — PROCHLORPERAZINE MALEATE 10 MG PO TABS: 10.0000 mg | ORAL_TABLET | Freq: Four times a day (QID) | ORAL | 1 refills | Status: DC | PRN

## 2019-10-12 MED ORDER — LORAZEPAM 0.5 MG PO TABS: 0.5000 mg | ORAL_TABLET | Freq: Four times a day (QID) | ORAL | 0 refills | Status: DC | PRN

## 2019-10-16 ENCOUNTER — Other Ambulatory Visit (HOSPITAL_BASED_OUTPATIENT_CLINIC_OR_DEPARTMENT_OTHER): Payer: Self-pay | Admitting: Endocrinology

## 2019-10-19 ENCOUNTER — Encounter: Payer: Self-pay | Admitting: *Deleted

## 2019-10-19 ENCOUNTER — Ambulatory Visit (HOSPITAL_COMMUNITY)
Admission: RE | Admit: 2019-10-19 | Discharge: 2019-10-19 | Disposition: A | Discharge status: Home / Self Care | Payer: 59 | Source: Ambulatory Visit | Attending: Hematology & Oncology | Admitting: Hematology & Oncology

## 2019-10-19 ENCOUNTER — Other Ambulatory Visit: Payer: Self-pay

## 2019-10-19 DIAGNOSIS — Z9049 Acquired absence of other specified parts of digestive tract: Secondary | ICD-10-CM | POA: Diagnosis not present

## 2019-10-19 DIAGNOSIS — R160 Hepatomegaly, not elsewhere classified: Secondary | ICD-10-CM | POA: Diagnosis not present

## 2019-10-19 DIAGNOSIS — C73 Malignant neoplasm of thyroid gland: Secondary | ICD-10-CM | POA: Diagnosis not present

## 2019-10-19 DIAGNOSIS — C787 Secondary malignant neoplasm of liver and intrahepatic bile duct: Secondary | ICD-10-CM | POA: Diagnosis not present

## 2019-10-19 MED ORDER — GADOBUTROL 1 MMOL/ML IV SOLN
10.0000 mL | Freq: Once | INTRAVENOUS | Status: AC | PRN
  Administered 2019-10-19: 10 mL via INTRAVENOUS

## 2019-10-24 ENCOUNTER — Inpatient Hospital Stay: Payer: 59

## 2019-10-24 ENCOUNTER — Encounter: Payer: Self-pay | Admitting: Hematology & Oncology

## 2019-10-24 ENCOUNTER — Inpatient Hospital Stay: Payer: 59 | Attending: Hematology & Oncology

## 2019-10-24 ENCOUNTER — Inpatient Hospital Stay (HOSPITAL_BASED_OUTPATIENT_CLINIC_OR_DEPARTMENT_OTHER): Payer: 59 | Admitting: Hematology & Oncology

## 2019-10-24 ENCOUNTER — Other Ambulatory Visit: Payer: Self-pay

## 2019-10-24 VITALS — BP 146/102 | HR 95 | Temp 98.2°F | Resp 20 | Wt 270.8 lb

## 2019-10-24 DIAGNOSIS — Z79899 Other long term (current) drug therapy: Secondary | ICD-10-CM | POA: Diagnosis not present

## 2019-10-24 DIAGNOSIS — C73 Malignant neoplasm of thyroid gland: Secondary | ICD-10-CM | POA: Diagnosis not present

## 2019-10-24 DIAGNOSIS — Z5111 Encounter for antineoplastic chemotherapy: Secondary | ICD-10-CM | POA: Diagnosis not present

## 2019-10-24 DIAGNOSIS — C787 Secondary malignant neoplasm of liver and intrahepatic bile duct: Secondary | ICD-10-CM | POA: Insufficient documentation

## 2019-10-24 DIAGNOSIS — Z5189 Encounter for other specified aftercare: Secondary | ICD-10-CM | POA: Diagnosis not present

## 2019-10-24 LAB — CMP (CANCER CENTER ONLY)
ALT: 20 U/L (ref 0–44)
AST: 17 U/L (ref 15–41)
Albumin: 3.5 g/dL (ref 3.5–5.0)
Alkaline Phosphatase: 75 U/L (ref 38–126)
Anion gap: 6 (ref 5–15)
BUN: 13 mg/dL (ref 6–20)
CO2: 29 mmol/L (ref 22–32)
Calcium: 9.1 mg/dL (ref 8.9–10.3)
Chloride: 102 mmol/L (ref 98–111)
Creatinine: 0.73 mg/dL (ref 0.61–1.24)
GFR, Est AFR Am: >60 mL/min (ref >60)
GFR, Estimated: >60 mL/min (ref >60)
Glucose, Bld: 95 mg/dL (ref 70–99)
Potassium: 4.3 mmol/L (ref 3.5–5.1)
Sodium: 137 mmol/L (ref 135–145)
Total Bilirubin: 0.4 mg/dL (ref 0.3–1.2)
Total Protein: 5.5 g/dL — ABNORMAL LOW (ref 6.5–8.1)

## 2019-10-24 LAB — CBC WITH DIFFERENTIAL (CANCER CENTER ONLY)
Abs Immature Granulocytes: 0.14 10*3/uL — ABNORMAL HIGH (ref 0.00–0.07)
Basophils Absolute: 0 10*3/uL (ref 0.0–0.1)
Basophils Relative: 1 %
Eosinophils Absolute: 0 10*3/uL (ref 0.0–0.5)
Eosinophils Relative: 1 %
HCT: 32 % — ABNORMAL LOW (ref 39.0–52.0)
Hemoglobin: 10.3 g/dL — ABNORMAL LOW (ref 13.0–17.0)
Immature Granulocytes: 3 %
Lymphocytes Relative: 18 %
Lymphs Abs: 0.9 10*3/uL (ref 0.7–4.0)
MCH: 32.4 pg (ref 26.0–34.0)
MCHC: 32.2 g/dL (ref 30.0–36.0)
MCV: 100.6 fL — ABNORMAL HIGH (ref 80.0–100.0)
Monocytes Absolute: 0.5 10*3/uL (ref 0.1–1.0)
Monocytes Relative: 10 %
Neutro Abs: 3.2 10*3/uL (ref 1.7–7.7)
Neutrophils Relative %: 67 %
Platelet Count: 200 10*3/uL (ref 150–400)
RBC: 3.18 MIL/uL — ABNORMAL LOW (ref 4.22–5.81)
RDW: 16.4 % — ABNORMAL HIGH (ref 11.5–15.5)
WBC Count: 4.8 10*3/uL (ref 4.0–10.5)
nRBC: 0 % (ref 0.0–0.2)

## 2019-10-24 LAB — TSH: TSH: 1.194 u[IU]/mL (ref 0.320–4.118)

## 2019-10-24 LAB — T4, FREE: Free T4: 1.4 ng/dL — ABNORMAL HIGH (ref 0.61–1.12)

## 2019-10-24 LAB — LACTATE DEHYDROGENASE: LDH: 297 U/L — ABNORMAL HIGH (ref 98–192)

## 2019-10-24 MED ORDER — SODIUM CHLORIDE 0.9 % IV SOLN
10.0000 mg | Freq: Once | INTRAVENOUS | Status: AC
  Administered 2019-10-24: 10 mg via INTRAVENOUS
  Filled 2019-10-24: qty 10

## 2019-10-24 MED ORDER — SODIUM CHLORIDE 0.9 % IV SOLN
Freq: Once | INTRAVENOUS | Status: AC
  Filled 2019-10-24: qty 250

## 2019-10-24 MED ORDER — HYDROMORPHONE HCL 1 MG/ML IJ SOLN
INTRAMUSCULAR | Status: AC
Start: 2019-10-24 — End: ?
  Filled 2019-10-24: qty 2

## 2019-10-24 MED ORDER — HYDROMORPHONE HCL 1 MG/ML IJ SOLN
2.0000 mg | Freq: Once | INTRAMUSCULAR | Status: AC
  Administered 2019-10-24: 2 mg via INTRAVENOUS

## 2019-10-24 MED ORDER — SODIUM CHLORIDE 0.9 % IV SOLN
150.0000 mg | Freq: Once | INTRAVENOUS | Status: AC
  Administered 2019-10-24: 150 mg via INTRAVENOUS
  Filled 2019-10-24: qty 150

## 2019-10-24 MED ORDER — PALONOSETRON HCL INJECTION 0.25 MG/5ML
INTRAVENOUS | Status: AC
  Filled 2019-10-24: qty 5

## 2019-10-24 MED ORDER — SODIUM CHLORIDE 0.9% FLUSH
10.0000 mL | INTRAVENOUS | Status: DC | PRN
  Administered 2019-10-24: 10 mL
  Filled 2019-10-24: qty 10

## 2019-10-24 MED ORDER — OXYCODONE HCL ER 20 MG PO T12A
20.0000 mg | EXTENDED_RELEASE_TABLET | Freq: Two times a day (BID) | ORAL | 0 refills | Status: DC
Start: 2019-10-24 — End: 2019-11-27

## 2019-10-24 MED ORDER — OXYCODONE HCL 10 MG PO TABS
10.0000 mg | ORAL_TABLET | Freq: Four times a day (QID) | ORAL | 0 refills | Status: DC | PRN
Start: 2019-10-24 — End: 2019-11-27

## 2019-10-24 MED ORDER — SODIUM CHLORIDE 0.9 % IV SOLN
Freq: Once | INTRAVENOUS | Status: AC
  Filled 2019-10-24: qty 10

## 2019-10-24 MED ORDER — SODIUM CHLORIDE 0.9 % IV SOLN
75.0000 mg/m2 | Freq: Once | INTRAVENOUS | Status: AC
  Administered 2019-10-24: 184 mg via INTRAVENOUS
  Filled 2019-10-24: qty 184

## 2019-10-24 MED ORDER — HEPARIN SOD (PORK) LOCK FLUSH 100 UNIT/ML IV SOLN
500.0000 [IU] | Freq: Once | INTRAVENOUS | Status: AC | PRN
  Administered 2019-10-24: 500 [IU]
  Filled 2019-10-24: qty 5

## 2019-10-24 MED ORDER — PALONOSETRON HCL INJECTION 0.25 MG/5ML
0.2500 mg | Freq: Once | INTRAVENOUS | Status: AC
  Administered 2019-10-24: 0.25 mg via INTRAVENOUS

## 2019-10-24 MED ORDER — SODIUM CHLORIDE 0.9 % IV SOLN
75.0000 mg/m2 | Freq: Once | INTRAVENOUS | Status: AC
  Administered 2019-10-24: 180 mg via INTRAVENOUS
  Filled 2019-10-24: qty 18

## 2019-10-24 NOTE — Progress Notes (Signed)
Dilaudid 2 mg given iv for c/o generalized pain. Now 11:10, patient states he feels great.

## 2019-10-24 NOTE — Progress Notes (Signed)
Hematology and Oncology Follow Up Visit  Brian [name] [id number] [birth year] 44 y.o. 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 is back for follow-up.  Overall, he is doing quite nicely right now.  He has had 2 cycles of chemotherapy.  We did do a MRI of the liver.  This was done on 10/19/2019.  Thankfully, it shows that he has had a response.  The dominant lesion in the right lobe of the liver now measures 6.4 x 4.1 cm.  An adjacent lesion now measures 2.4 x 1.8 cm.  There are two 1 cm masses in the left hepatic lobe which are stable in size.  There does not appear to be any disease outside the liver.  He is having some pain.  He is on OxyContin and oxycodone.  We will have to get these renewed for him.  He has had a decent appetite.  His blood pressure has been doing well he says at home.  Today blood pressure has been high.  He has not been taking blood pressure medicine for what he tells me.  I told him he probably needs to get back on his blood pressure medications.  He has had no problems with diarrhea.  He does get IV fluids in between treatments.  This does seem to help him.  He has had no issues with bleeding.  There is really been no leg swelling.  He has had no fever.  There is no cough or shortness of breath.  Overall, I would say his performance status is ECOG 1.    Medications:  Current Outpatient Medications:  .  dexamethasone (DECADRON) 4 MG tablet, Take 1 tablet (4 mg total) by mouth 2 (two) times daily with a meal. Take 2 pills twice a day for 4 days -- start day BEFORE each chemotherapy, Disp: 60 tablet, Rfl: 1 .  furosemide (LASIX) 80 MG tablet, Take 1 tablet (80  mg total) by mouth daily. (Patient taking differently: Take 80 mg by mouth daily as needed for fluid. ), Disp: 30 tablet, Rfl: 1 .  gabapentin (NEURONTIN) 300 MG capsule, Take 1 capsule (300 mg total) by mouth 3 (three) times daily., Disp: 90 capsule, Rfl: 2 .  levofloxacin (LEVAQUIN) 500 MG tablet, Take 1 tablet (500 mg total) by mouth daily. Take daily for 14 days -- start day AFTER each cehmotherapy, Disp: 14 tablet, Rfl: 6 .  levothyroxine (SYNTHROID) 200 MCG tablet, Take by mouth., Disp: , Rfl:  .  levothyroxine (SYNTHROID) 300 MCG tablet, Take 275 mcg by mouth daily. , Disp: , Rfl:  .  levothyroxine (SYNTHROID) 75 MCG tablet, Take by mouth., Disp: , Rfl:  .  lidocaine-prilocaine (EMLA) cream, Apply to affected area once, Disp: 30 g, Rfl: 3 .  methylphenidate (RITALIN) 10 MG tablet, Take 1 tablet (10 mg total) by mouth 2 (two) times daily. (Patient taking differently: Take 10 mg by mouth daily. ), Disp: 60 tablet, Rfl: 0 .  metoCLOPramide (REGLAN) 10 MG tablet, TAKE 1 TABLET BY MOUTH FOUR TIMES DAILY BEFORE MEALS AND AT BEDTIME, Disp: 120 tablet, Rfl: 0 .  metolazone (ZAROXOLYN)  5 MG tablet, Take 1 tablet (5 mg total) by mouth daily. Take 1 hour before Lasix. (Patient taking differently: Take 5 mg by mouth daily as needed (leg swelling). Take 1 hour before Lasix.), Disp: 30 tablet, Rfl: 1 .  Multiple Vitamin (MULTIVITAMIN) capsule, Take 1 capsule by mouth daily., Disp: , Rfl:  .  ondansetron (ZOFRAN) 8 MG tablet, Take 8 mg by mouth every 8 (eight) hours as needed for nausea or vomiting. , Disp: , Rfl:  .  oxyCODONE (OXYCONTIN) 20 mg 12 hr tablet, Take 1 tablet (20 mg total) by mouth every 12 (twelve) hours., Disp: 60 tablet, Rfl: 0 .  Oxycodone HCl 10 MG TABS, Take 1 tablet (10 mg total) by mouth every 6 (six) hours as needed., Disp: 90 tablet, Rfl: 0 .  potassium chloride SA (KLOR-CON) 10 MEQ tablet, Take 1 tablet (10 mEq total) by mouth daily. (Patient taking differently: Take 10 mEq by mouth  daily as needed (takes with lasix). ), Disp: 30 tablet, Rfl: 1 .  prochlorperazine (COMPAZINE) 10 MG tablet, Take 1 tablet (10 mg total) by mouth every 6 (six) hours as needed (Nausea or vomiting)., Disp: 30 tablet, Rfl: 1 .  promethazine (PHENERGAN) 12.5 MG tablet, Take 1 tablet (12.5 mg total) by mouth at bedtime., Disp: 30 tablet, Rfl: 0 .  promethazine (PHENERGAN) 25 MG tablet, TAKE 1 TABLET BY MOUTH EVERY 6 HOURS AS NEEDED FOR NAUSEA & VOMITING, Disp: 120 tablet, Rfl: 0 .  sucralfate (CARAFATE) 1 GM/10ML suspension, Take 10 mLs (1 g total) by mouth 4 (four) times daily -  with meals and at bedtime., Disp: 420 mL, Rfl: 1 .  TURMERIC CURCUMIN PO, Take 1 capsule by mouth daily., Disp: , Rfl:  .  baclofen (LIORESAL) 10 MG tablet, Take 1 tablet (10 mg total) by mouth 3 (three) times daily. (Patient not taking: Reported on 10/24/2019), Disp: 30 each, Rfl: 0 .  cloNIDine (CATAPRES) 0.1 MG tablet, Take 0.1 mg by mouth 2 (two) times daily.  (Patient not taking: Reported on 10/24/2019), Disp: , Rfl:  .  cloNIDine (CATAPRES) 0.2 MG tablet, Take 0.2 mg by mouth 3 times/day as needed-between meals & bedtime. (Patient not taking: Reported on 10/24/2019), Disp: , Rfl:  .  cyclobenzaprine (FLEXERIL) 10 MG tablet, Take 1 tablet (10 mg total) by mouth 3 (three) times daily as needed for muscle spasms. (Patient not taking: Reported on 10/24/2019), Disp: 30 tablet, Rfl: 3 .  EPINEPHrine (EPIPEN 2-PAK) 0.3 mg/0.3 mL IJ SOAJ injection, USE AS DIRECTED FOR LIFE THREATENING ALLERGIC REACTIONS (Patient not taking: Reported on 10/24/2019), Disp: 4 Device, Rfl: 3 .  isosorbide mononitrate (IMDUR) 60 MG 24 hr tablet, Take 1 tablet (60 mg total) by mouth daily., Disp: 90 tablet, Rfl: 3 .  LORazepam (ATIVAN) 0.5 MG tablet, Take 1 tablet (0.5 mg total) by mouth every 6 (six) hours as needed (Nausea or vomiting). (Patient not taking: Reported on 10/24/2019), Disp: 30 tablet, Rfl: 0 .  losartan (COZAAR) 25 MG tablet, Take 1 tablet (25 mg  total) by mouth 2 (two) times daily. (Patient not taking: Reported on 10/24/2019), Disp: 60 tablet, Rfl: 5 .  magic mouthwash SOLN, Take 10 mLs by mouth 4 (four) times daily as needed for mouth pain. (Patient not taking: Reported on 10/24/2019), Disp: 400 mL, Rfl: 1 .  OLANZapine (ZYPREXA) 10 MG tablet, Take 1 tablet (10 mg total) by mouth at bedtime. (Patient not taking: Reported on 10/24/2019), Disp: 30 tablet, Rfl: 4 No current facility-administered medications  for this visit.  Facility-Administered Medications Ordered in Other Visits:  .  HYDROmorphone (DILAUDID) injection 4 mg, 4 mg, Intravenous, Once, Shanika Levings, Rudell Cobb, MD  Allergies:  Allergies  Allergen Reactions  . Dextromethorphan-Guaifenesin Other (See Comments)    Irregular heartbeat   . Doxycycline Anaphylaxis, Nausea And Vomiting, Rash and Other (See Comments)    "heart arrythmia" and "dyspepsia" (only oral doxycycline causes reaction)  . Gadobutrol Hives and Other (See Comments)    Patient had MRI scan at [facility]. Patient called one hour after he left imaging facility to report two "blisters" that came up on "back" of lip.    . Gadolinium Derivatives Hives and Other (See Comments)    Patient had MRI scan at [facility]. Patient called one hour after he left imaging facility to report two "blisters" that came up on "back" of lip.   . Guaifenesin Palpitations       . Ibuprofen Hives and Itching  . Lisinopril Other (See Comments)    Angioedema  . Pantoprazole Itching  . Tramadol Hives and Itching  . Barium Rash    Developed redness around neck after drinking 1st bottle of Barocat; pt was given Benedryl by ED Mds to be "on the safe side" before drinking 2nd bottle    Past Medical History, Surgical history, Social history, and Family History were reviewed and updated.  Review of Systems: Review of Systems  Constitutional: Negative.   HENT:   Positive for lump/mass and sore throat.   Eyes: Negative.    Respiratory: Negative.   Cardiovascular: Negative.   Gastrointestinal: Negative.   Endocrine: Negative.   Genitourinary: Negative.    Musculoskeletal: Negative.   Skin: Negative.   Neurological: Negative.   Hematological: Negative.   Psychiatric/Behavioral: Negative.     Physical Exam:  weight is 270 lb 12.8 oz (122.8 kg). His oral temperature is 98.2 F (36.8 C). His blood pressure is 146/102 (abnormal) and his pulse is 95. His respiration is 20 and oxygen saturation is 100%.   Wt Readings from Last 3 Encounters:  10/24/19 270 lb 12.8 oz (122.8 kg)  09/06/19 269 lb (122 kg)  08/14/19 272 lb 4 oz (123.5 kg)   His vital signs show temperature of 98 7.  Pulse 89.  Blood pressure 141/87.  Weight was not taken.  Physical Exam Vitals reviewed.  HENT:     Head: Normocephalic and atraumatic.  Eyes:     Pupils: Pupils are equal, round, and reactive to light.  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  This is well-healing.  There is some slight fullness but no distinct mass noted in the neck. Cardiovascular:     Rate and Rhythm: Normal rate and regular rhythm.     Heart sounds: Normal heart sounds.  Pulmonary:     Effort: Pulmonary effort is normal.     Breath sounds: Normal breath sounds.  Abdominal:     General: Bowel sounds are normal.     Palpations: Abdomen is soft.  Musculoskeletal:        General: Swelling present. No tenderness or deformity. Normal range of motion.     Cervical back: Normal range of motion.  Lymphadenopathy:     Cervical: No cervical adenopathy.  Skin:    General: Skin is warm and dry.     Findings: No erythema or rash.  Neurological:     Mental Status: He is alert and  oriented to person, place, and time.  Psychiatric:        Behavior: Behavior normal.        Thought Content: Thought content normal.        Judgment: Judgment normal.      Lab Results  Component Value Date   WBC 4.8  10/24/2019   HGB 10.3 (L) 10/24/2019   HCT 32.0 (L) 10/24/2019   MCV 100.6 (H) 10/24/2019   PLT 200 10/24/2019     Chemistry      Component Value Date/Time   NA 132 (L) 10/03/2019 0812   NA 141 02/06/2019 1210   K 4.4 10/03/2019 0812   CL 95 (L) 10/03/2019 0812   CO2 29 10/03/2019 0812   BUN 26 (H) 10/03/2019 0812   BUN 8 02/06/2019 1210   CREATININE 0.85 10/03/2019 0812   CREATININE 0.88 09/26/2019 0853      Component Value Date/Time   CALCIUM 9.5 10/03/2019 0812   ALKPHOS 68 10/03/2019 0812   AST 14 (L) 10/03/2019 0812   AST 14 (L) 09/20/2019 1240   ALT 19 10/03/2019 0812   ALT 17 09/20/2019 1240   BILITOT 0.5 10/03/2019 0812   BILITOT 0.5 09/20/2019 1240      Impression and Plan: [name] is a 43 year old white male.  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  Again, his thyroid cancer is RET mutated.  I am glad to see that the chemotherapy is working.  His tumors are shrinking.  Hopefully they will continue to shrink.  If so, we still might be able to get him to surgery to resect out what is in his liver.  I know this would be incredibly aggressive but yet he is in good health and only 44 years old.  I would plan for 2 more cycles of treatment.  We will then repeat another MRI of the liver.  We will have him come back to see [place] in another 3 weeks.  He will come back weekly for IV fluids if he needs them.     [name], MD 9/7/20218:29 AM

## 2019-10-24 NOTE — Patient Instructions (Signed)

## 2019-10-25 ENCOUNTER — Other Ambulatory Visit (HOSPITAL_BASED_OUTPATIENT_CLINIC_OR_DEPARTMENT_OTHER): Payer: Self-pay | Admitting: Endocrinology

## 2019-10-25 LAB — T3 UPTAKE: T3 Uptake Ratio: 29 % (ref 24–39)

## 2019-10-26 ENCOUNTER — Inpatient Hospital Stay: Payer: 59

## 2019-10-26 ENCOUNTER — Other Ambulatory Visit: Payer: Self-pay

## 2019-10-26 VITALS — BP 144/89 | HR 90 | Temp 98.2°F | Resp 18

## 2019-10-26 DIAGNOSIS — Z79899 Other long term (current) drug therapy: Secondary | ICD-10-CM | POA: Diagnosis not present

## 2019-10-26 DIAGNOSIS — Z5189 Encounter for other specified aftercare: Secondary | ICD-10-CM | POA: Diagnosis not present

## 2019-10-26 DIAGNOSIS — C73 Malignant neoplasm of thyroid gland: Secondary | ICD-10-CM | POA: Diagnosis not present

## 2019-10-26 DIAGNOSIS — Z5111 Encounter for antineoplastic chemotherapy: Secondary | ICD-10-CM | POA: Diagnosis not present

## 2019-10-26 DIAGNOSIS — C787 Secondary malignant neoplasm of liver and intrahepatic bile duct: Secondary | ICD-10-CM | POA: Diagnosis not present

## 2019-10-26 MED ORDER — PEGFILGRASTIM-JMDB 6 MG/0.6ML ~~LOC~~ SOSY
6.0000 mg | PREFILLED_SYRINGE | Freq: Once | SUBCUTANEOUS | Status: AC
  Administered 2019-10-26: 6 mg via SUBCUTANEOUS

## 2019-10-26 MED ORDER — PEGFILGRASTIM-JMDB 6 MG/0.6ML ~~LOC~~ SOSY
PREFILLED_SYRINGE | SUBCUTANEOUS | Status: AC
  Filled 2019-10-26: qty 0.6

## 2019-10-30 ENCOUNTER — Other Ambulatory Visit: Payer: Self-pay | Admitting: *Deleted

## 2019-10-30 ENCOUNTER — Telehealth: Payer: Self-pay | Admitting: *Deleted

## 2019-10-30 DIAGNOSIS — R0609 Other forms of dyspnea: Secondary | ICD-10-CM

## 2019-10-30 DIAGNOSIS — C73 Malignant neoplasm of thyroid gland: Secondary | ICD-10-CM

## 2019-10-30 DIAGNOSIS — R0600 Dyspnea, unspecified: Secondary | ICD-10-CM

## 2019-10-30 DIAGNOSIS — D696 Thrombocytopenia, unspecified: Secondary | ICD-10-CM

## 2019-10-30 NOTE — Telephone Encounter (Signed)
Call received from patient stating that he is having slight nausea and would like to know if he can come in for IVF's this week on Thursday.  Pt also has questions regarding his continuous FMLA paperwork.  Informed pt that I would relay his message to T. Carlota Raspberry regarding FMLA papers.  Message sent to scheduling regarding IVF appt on Thursday.  Pt appreciative of assistance and has no other questions at this time.

## 2019-10-31 ENCOUNTER — Other Ambulatory Visit: Payer: Self-pay | Admitting: *Deleted

## 2019-10-31 ENCOUNTER — Other Ambulatory Visit: Payer: Self-pay

## 2019-10-31 ENCOUNTER — Inpatient Hospital Stay: Payer: 59

## 2019-10-31 VITALS — BP 131/89 | HR 100 | Temp 98.4°F | Resp 18

## 2019-10-31 DIAGNOSIS — C787 Secondary malignant neoplasm of liver and intrahepatic bile duct: Secondary | ICD-10-CM | POA: Diagnosis not present

## 2019-10-31 DIAGNOSIS — D696 Thrombocytopenia, unspecified: Secondary | ICD-10-CM

## 2019-10-31 DIAGNOSIS — Z5111 Encounter for antineoplastic chemotherapy: Secondary | ICD-10-CM | POA: Diagnosis not present

## 2019-10-31 DIAGNOSIS — Z5189 Encounter for other specified aftercare: Secondary | ICD-10-CM | POA: Diagnosis not present

## 2019-10-31 DIAGNOSIS — Z95828 Presence of other vascular implants and grafts: Secondary | ICD-10-CM

## 2019-10-31 DIAGNOSIS — C73 Malignant neoplasm of thyroid gland: Secondary | ICD-10-CM | POA: Diagnosis not present

## 2019-10-31 DIAGNOSIS — Z79899 Other long term (current) drug therapy: Secondary | ICD-10-CM | POA: Diagnosis not present

## 2019-10-31 DIAGNOSIS — R109 Unspecified abdominal pain: Secondary | ICD-10-CM

## 2019-10-31 LAB — CBC WITH DIFFERENTIAL (CANCER CENTER ONLY)
Abs Immature Granulocytes: 0.21 10*3/uL — ABNORMAL HIGH (ref 0.00–0.07)
Basophils Absolute: 0.1 10*3/uL (ref 0.0–0.1)
Basophils Relative: 2 %
Eosinophils Absolute: 0 10*3/uL (ref 0.0–0.5)
Eosinophils Relative: 0 %
HCT: 30.5 % — ABNORMAL LOW (ref 39.0–52.0)
Hemoglobin: 10.1 g/dL — ABNORMAL LOW (ref 13.0–17.0)
Immature Granulocytes: 5 %
Lymphocytes Relative: 20 %
Lymphs Abs: 0.8 10*3/uL (ref 0.7–4.0)
MCH: 32.2 pg (ref 26.0–34.0)
MCHC: 33.1 g/dL (ref 30.0–36.0)
MCV: 97.1 fL (ref 80.0–100.0)
Monocytes Absolute: 0.8 10*3/uL (ref 0.1–1.0)
Monocytes Relative: 20 %
Neutro Abs: 2.1 10*3/uL (ref 1.7–7.7)
Neutrophils Relative %: 53 %
Platelet Count: 139 10*3/uL — ABNORMAL LOW (ref 150–400)
RBC: 3.14 MIL/uL — ABNORMAL LOW (ref 4.22–5.81)
RDW: 15.7 % — ABNORMAL HIGH (ref 11.5–15.5)
WBC Count: 3.9 10*3/uL — ABNORMAL LOW (ref 4.0–10.5)
nRBC: 1 % — ABNORMAL HIGH (ref 0.0–0.2)

## 2019-10-31 LAB — CMP (CANCER CENTER ONLY)
ALT: 15 U/L (ref 0–44)
AST: 14 U/L — ABNORMAL LOW (ref 15–41)
Albumin: 3.6 g/dL (ref 3.5–5.0)
Alkaline Phosphatase: 94 U/L (ref 38–126)
Anion gap: 7 (ref 5–15)
BUN: 14 mg/dL (ref 6–20)
CO2: 27 mmol/L (ref 22–32)
Calcium: 9 mg/dL (ref 8.9–10.3)
Chloride: 103 mmol/L (ref 98–111)
Creatinine: 0.9 mg/dL (ref 0.61–1.24)
GFR, Est AFR Am: >60 mL/min (ref >60)
GFR, Estimated: >60 mL/min (ref >60)
Glucose, Bld: 97 mg/dL (ref 70–99)
Potassium: 3.8 mmol/L (ref 3.5–5.1)
Sodium: 137 mmol/L (ref 135–145)
Total Bilirubin: 0.5 mg/dL (ref 0.3–1.2)
Total Protein: 5.6 g/dL — ABNORMAL LOW (ref 6.5–8.1)

## 2019-10-31 MED ORDER — KETOROLAC TROMETHAMINE 15 MG/ML IJ SOLN
INTRAMUSCULAR | Status: AC
  Filled 2019-10-31: qty 2

## 2019-10-31 MED ORDER — KETOROLAC TROMETHAMINE 15 MG/ML IJ SOLN
30.0000 mg | Freq: Once | INTRAMUSCULAR | Status: AC
  Administered 2019-10-31: 30 mg via INTRAVENOUS

## 2019-10-31 MED ORDER — SODIUM CHLORIDE 0.9% FLUSH
10.0000 mL | Freq: Once | INTRAVENOUS | Status: AC
  Administered 2019-10-31: 10 mL via INTRAVENOUS
  Filled 2019-10-31: qty 10

## 2019-10-31 MED ORDER — SODIUM CHLORIDE 0.9 % IV SOLN
1000.0000 mL | INTRAVENOUS | Status: DC
  Administered 2019-10-31: 1000 mL via INTRAVENOUS
  Filled 2019-10-31 (×2): qty 1000

## 2019-10-31 MED ORDER — KETOROLAC TROMETHAMINE 30 MG/ML IJ SOLN: 30.0000 mg | Freq: Once | INTRAMUSCULAR | Status: DC

## 2019-10-31 MED ORDER — BACLOFEN 10 MG PO TABS: 10.0000 mg | ORAL_TABLET | Freq: Three times a day (TID) | ORAL | 2 refills | Status: DC

## 2019-10-31 MED ORDER — HEPARIN SOD (PORK) LOCK FLUSH 100 UNIT/ML IV SOLN
500.0000 [IU] | Freq: Once | INTRAVENOUS | Status: AC
  Administered 2019-10-31: 500 [IU] via INTRAVENOUS
  Filled 2019-10-31: qty 5

## 2019-10-31 NOTE — Patient Instructions (Signed)
Dehydration, Adult Dehydration is a condition in which there is not enough water or other fluids in the body. This happens when a person loses more fluids than he or she takes in. Important organs, such as the kidneys, brain, and heart, cannot function without a proper amount of fluids. Any loss of fluids from the body can lead to dehydration. Dehydration can be mild, moderate, or severe. It should be treated right away to prevent it from becoming severe. What are the causes? Dehydration may be caused by:  Conditions that cause loss of water or other fluids, such as diarrhea, vomiting, or sweating or urinating a lot.  Not drinking enough fluids, especially when you are ill or doing activities that require a lot of energy.  Other illnesses and conditions, such as fever or infection.  Certain medicines, such as medicines that remove excess fluid from the body (diuretics).  Lack of safe drinking water.  Not being able to get enough water and food. What increases the risk? The following factors may make you more likely to develop this condition:  Having a long-term (chronic) illness that has not been treated properly, such as diabetes, heart disease, or kidney disease.  Being 65 years of age or older.  Having a disability.  Living in a place that is high in altitude, where thinner, drier air causes more fluid loss.  Doing exercises that put stress on your body for a long time (endurance sports). What are the signs or symptoms? Symptoms of dehydration depend on how severe it is. Mild or moderate dehydration  Thirst.  Dry lips or dry mouth.  Dizziness or light-headedness, especially when standing up from a seated position.  Muscle cramps.  Dark urine. Urine may be the color of tea.  Less urine or tears produced than usual.  Headache. Severe dehydration  Changes in skin. Your skin may be cold and clammy, blotchy, or pale. Your skin also may not return to normal after being  lightly pinched and released.  Little or no tears, urine, or sweat.  Changes in vital signs, such as rapid breathing and low blood pressure. Your pulse may be weak or may be faster than 100 beats a minute when you are sitting still.  Other changes, such as: ? Feeling very thirsty. ? Sunken eyes. ? Cold hands and feet. ? Confusion. ? Being very tired (lethargic) or having trouble waking from sleep. ? Short-term weight loss. ? Loss of consciousness. How is this diagnosed? This condition is diagnosed based on your symptoms and a physical exam. You may have blood and urine tests to help confirm the diagnosis. How is this treated? Treatment for this condition depends on how severe it is. Treatment should be started right away. Do not wait until dehydration becomes severe. Severe dehydration is an emergency and needs to be treated in a hospital.  Mild or moderate dehydration can be treated at home. You may be asked to: ? Drink more fluids. ? Drink an oral rehydration solution (ORS). This drink helps restore proper amounts of fluids and salts and minerals in the blood (electrolytes).  Severe dehydration can be treated: ? With IV fluids. ? By correcting abnormal levels of electrolytes. This is often done by giving electrolytes through a tube that is passed through your nose and into your stomach (nasogastric tube, or NG tube). ? By treating the underlying cause of dehydration. Follow these instructions at home: Oral rehydration solution If told by your health care provider, drink an ORS:  Make   an ORS by following instructions on the package.  Start by drinking small amounts, about  cup (120 mL) every 5-10 minutes.  Slowly increase how much you drink until you have taken the amount recommended by your health care provider. Eating and drinking         Drink enough clear fluid to keep your urine pale yellow. If you were told to drink an ORS, finish the ORS first and then start slowly  drinking other clear fluids. Drink fluids such as: ? Water. Do not drink only water. Doing that can lead to hyponatremia, which is having too little salt (sodium) in the body. ? Water from ice chips you suck on. ? Fruit juice that you have added water to (diluted fruit juice). ? Low-calorie sports drinks.  Eat foods that contain a healthy balance of electrolytes, such as bananas, oranges, potatoes, tomatoes, and spinach.  Do not drink alcohol.  Avoid the following: ? Drinks that contain a lot of sugar. These include high-calorie sports drinks, fruit juice that is not diluted, and soda. ? Caffeine. ? Foods that are greasy or contain a lot of fat or sugar. General instructions  Take over-the-counter and prescription medicines only as told by your health care provider.  Do not take sodium tablets. Doing that can lead to having too much sodium in the body (hypernatremia).  Return to your normal activities as told by your health care provider. Ask your health care provider what activities are safe for you.  Keep all follow-up visits as told by your health care provider. This is important. Contact a health care provider if:  You have muscle cramps, pain, or discomfort, such as: ? Pain in your abdomen and the pain gets worse or stays in one area (localizes). ? Stiff neck.  You have a rash.  You are more irritable than usual.  You are sleepier or have a harder time waking than usual.  You feel weak or dizzy.  You feel very thirsty. Get help right away if you have:  Any symptoms of severe dehydration.  Symptoms of vomiting, such as: ? You cannot eat or drink without vomiting. ? Vomiting gets worse or does not go away. ? Vomit includes blood or green matter (bile).  Symptoms that get worse with treatment.  A fever.  A severe headache.  Problems with urination or bowel movements, such as: ? Diarrhea that gets worse or does not go away. ? Blood in your stool (feces). This  may cause stool to look black and tarry. ? Not urinating, or urinating only a small amount of very dark urine, within 6-8 hours.  Trouble breathing. These symptoms may represent a serious problem that is an emergency. Do not wait to see if the symptoms will go away. Get medical help right away. Call your local emergency services (911 in the [place] Do not drive yourself to the hospital. Summary  Dehydration is a condition in which there is not enough water or other fluids in the body. This happens when a person loses more fluids than he or she takes in.  Treatment for this condition depends on how severe it is. Treatment should be started right away. Do not wait until dehydration becomes severe.  Drink enough clear fluid to keep your urine pale yellow. If you were told to drink an oral rehydration solution (ORS), finish the ORS first and then start slowly drinking other clear fluids.  Take over-the-counter and prescription medicines only as told by your health care   provider.  Get help right away if you have any symptoms of severe dehydration. This information is not intended to replace advice given to you by your health care provider. Make sure you discuss any questions you have with your health care provider. Document Revised: 09/15/2018 Document Reviewed: 09/15/2018 Elsevier Patient Education  2020 Elsevier Inc.   

## 2019-11-02 ENCOUNTER — Ambulatory Visit: Payer: 59

## 2019-11-02 ENCOUNTER — Telehealth: Payer: Self-pay | Admitting: *Deleted

## 2019-11-02 ENCOUNTER — Other Ambulatory Visit: Payer: Self-pay | Admitting: Family

## 2019-11-02 ENCOUNTER — Other Ambulatory Visit: Payer: 59

## 2019-11-02 NOTE — Telephone Encounter (Signed)
Call received from patient with questions regarding his FMLA paperwork, short term disability paperwork, requesting a letter to go back to work on 11/20/19 with partial shifts and activity as tolerated and also a letter to exempt him from receiving the Covid-19 vaccine.  Patient informed that Gena Fray would contact him regarding FMLA and STD paperwork and that Dr. Marin Olp will not write a letter for pt to be exempt from the Covid-19 vaccine.  Pt appreciative of call back and has no further questions at this time.

## 2019-11-03 ENCOUNTER — Other Ambulatory Visit: Payer: Self-pay | Admitting: Family

## 2019-11-13 ENCOUNTER — Other Ambulatory Visit (HOSPITAL_BASED_OUTPATIENT_CLINIC_OR_DEPARTMENT_OTHER): Payer: Self-pay | Admitting: Hematology & Oncology

## 2019-11-13 ENCOUNTER — Other Ambulatory Visit: Payer: Self-pay | Admitting: Hematology & Oncology

## 2019-11-13 ENCOUNTER — Other Ambulatory Visit: Payer: Self-pay | Admitting: Family

## 2019-11-13 DIAGNOSIS — R600 Localized edema: Secondary | ICD-10-CM

## 2019-11-13 DIAGNOSIS — C73 Malignant neoplasm of thyroid gland: Secondary | ICD-10-CM

## 2019-11-14 ENCOUNTER — Inpatient Hospital Stay: Payer: 59

## 2019-11-14 ENCOUNTER — Other Ambulatory Visit: Payer: Self-pay

## 2019-11-14 ENCOUNTER — Inpatient Hospital Stay (HOSPITAL_BASED_OUTPATIENT_CLINIC_OR_DEPARTMENT_OTHER): Payer: 59 | Admitting: Hematology & Oncology

## 2019-11-14 ENCOUNTER — Encounter: Payer: Self-pay | Admitting: Hematology & Oncology

## 2019-11-14 ENCOUNTER — Ambulatory Visit: Payer: 59 | Attending: Internal Medicine

## 2019-11-14 VITALS — BP 120/85 | HR 98 | Temp 98.3°F | Resp 18 | Ht 70.0 in | Wt 268.0 lb

## 2019-11-14 DIAGNOSIS — Z5111 Encounter for antineoplastic chemotherapy: Secondary | ICD-10-CM | POA: Diagnosis not present

## 2019-11-14 DIAGNOSIS — C73 Malignant neoplasm of thyroid gland: Secondary | ICD-10-CM

## 2019-11-14 DIAGNOSIS — R0600 Dyspnea, unspecified: Secondary | ICD-10-CM

## 2019-11-14 DIAGNOSIS — D696 Thrombocytopenia, unspecified: Secondary | ICD-10-CM

## 2019-11-14 DIAGNOSIS — R0609 Other forms of dyspnea: Secondary | ICD-10-CM

## 2019-11-14 DIAGNOSIS — Z23 Encounter for immunization: Secondary | ICD-10-CM

## 2019-11-14 DIAGNOSIS — C787 Secondary malignant neoplasm of liver and intrahepatic bile duct: Secondary | ICD-10-CM | POA: Diagnosis not present

## 2019-11-14 DIAGNOSIS — Z79899 Other long term (current) drug therapy: Secondary | ICD-10-CM | POA: Diagnosis not present

## 2019-11-14 DIAGNOSIS — Z5189 Encounter for other specified aftercare: Secondary | ICD-10-CM | POA: Diagnosis not present

## 2019-11-14 LAB — CMP (CANCER CENTER ONLY)
ALT: 16 U/L (ref 0–44)
AST: 15 U/L (ref 15–41)
Albumin: 3.8 g/dL (ref 3.5–5.0)
Alkaline Phosphatase: 61 U/L (ref 38–126)
Anion gap: 8 (ref 5–15)
BUN: 24 mg/dL — ABNORMAL HIGH (ref 6–20)
CO2: 27 mmol/L (ref 22–32)
Calcium: 8.6 mg/dL — ABNORMAL LOW (ref 8.9–10.3)
Chloride: 103 mmol/L (ref 98–111)
Creatinine: 0.91 mg/dL (ref 0.61–1.24)
GFR, Est AFR Am: >60 mL/min (ref >60)
GFR, Estimated: >60 mL/min (ref >60)
Glucose, Bld: 127 mg/dL — ABNORMAL HIGH (ref 70–99)
Potassium: 3.9 mmol/L (ref 3.5–5.1)
Sodium: 138 mmol/L (ref 135–145)
Total Bilirubin: 0.4 mg/dL (ref 0.3–1.2)
Total Protein: 5.6 g/dL — ABNORMAL LOW (ref 6.5–8.1)

## 2019-11-14 LAB — CBC WITH DIFFERENTIAL (CANCER CENTER ONLY)
Abs Immature Granulocytes: 0.3 10*3/uL — ABNORMAL HIGH (ref 0.00–0.07)
Basophils Absolute: 0 10*3/uL (ref 0.0–0.1)
Basophils Relative: 0 %
Eosinophils Absolute: 0 10*3/uL (ref 0.0–0.5)
Eosinophils Relative: 0 %
HCT: 34 % — ABNORMAL LOW (ref 39.0–52.0)
Hemoglobin: 10.9 g/dL — ABNORMAL LOW (ref 13.0–17.0)
Immature Granulocytes: 4 %
Lymphocytes Relative: 14 %
Lymphs Abs: 1 10*3/uL (ref 0.7–4.0)
MCH: 32.7 pg (ref 26.0–34.0)
MCHC: 32.1 g/dL (ref 30.0–36.0)
MCV: 102.1 fL — ABNORMAL HIGH (ref 80.0–100.0)
Monocytes Absolute: 0.5 10*3/uL (ref 0.1–1.0)
Monocytes Relative: 7 %
Neutro Abs: 5.7 10*3/uL (ref 1.7–7.7)
Neutrophils Relative %: 75 %
Platelet Count: 161 10*3/uL (ref 150–400)
RBC: 3.33 MIL/uL — ABNORMAL LOW (ref 4.22–5.81)
RDW: 17.8 % — ABNORMAL HIGH (ref 11.5–15.5)
WBC Count: 7.6 10*3/uL (ref 4.0–10.5)
nRBC: 0.3 % — ABNORMAL HIGH (ref 0.0–0.2)

## 2019-11-14 LAB — MAGNESIUM: Magnesium: 1.7 mg/dL (ref 1.7–2.4)

## 2019-11-14 LAB — LACTATE DEHYDROGENASE: LDH: 252 U/L — ABNORMAL HIGH (ref 98–192)

## 2019-11-14 MED ORDER — SODIUM CHLORIDE 0.9 % IV SOLN
75.0000 mg/m2 | Freq: Once | INTRAVENOUS | Status: AC
  Administered 2019-11-14: 180 mg via INTRAVENOUS
  Filled 2019-11-14: qty 18

## 2019-11-14 MED ORDER — SODIUM CHLORIDE 0.9 % IV SOLN
Freq: Once | INTRAVENOUS | Status: AC
  Filled 2019-11-14: qty 10

## 2019-11-14 MED ORDER — PALONOSETRON HCL INJECTION 0.25 MG/5ML
0.2500 mg | Freq: Once | INTRAVENOUS | Status: AC
  Administered 2019-11-14: 0.25 mg via INTRAVENOUS

## 2019-11-14 MED ORDER — SODIUM CHLORIDE 0.9 % IV SOLN
10.0000 mg | Freq: Once | INTRAVENOUS | Status: AC
  Administered 2019-11-14: 10 mg via INTRAVENOUS
  Filled 2019-11-14: qty 10

## 2019-11-14 MED ORDER — SODIUM CHLORIDE 0.9 % IV SOLN
150.0000 mg | Freq: Once | INTRAVENOUS | Status: AC
  Administered 2019-11-14: 150 mg via INTRAVENOUS
  Filled 2019-11-14: qty 150

## 2019-11-14 MED ORDER — HEPARIN SOD (PORK) LOCK FLUSH 100 UNIT/ML IV SOLN
500.0000 [IU] | Freq: Once | INTRAVENOUS | Status: AC | PRN
  Administered 2019-11-14: 500 [IU]
  Filled 2019-11-14: qty 5

## 2019-11-14 MED ORDER — SODIUM CHLORIDE 0.9 % IV SOLN
Freq: Once | INTRAVENOUS | Status: AC
  Filled 2019-11-14: qty 250

## 2019-11-14 MED ORDER — SODIUM CHLORIDE 0.9 % IV SOLN
75.0000 mg/m2 | Freq: Once | INTRAVENOUS | Status: AC
  Administered 2019-11-14: 184 mg via INTRAVENOUS
  Filled 2019-11-14: qty 184

## 2019-11-14 MED ORDER — PALONOSETRON HCL INJECTION 0.25 MG/5ML
INTRAVENOUS | Status: AC
  Filled 2019-11-14: qty 5

## 2019-11-14 MED ORDER — SODIUM CHLORIDE 0.9% FLUSH
10.0000 mL | INTRAVENOUS | Status: DC | PRN
  Administered 2019-11-14: 10 mL
  Filled 2019-11-14: qty 10

## 2019-11-14 NOTE — Patient Instructions (Signed)
Docetaxel injection What is this medicine? DOCETAXEL (doe se TAX el) is a chemotherapy drug. It targets fast dividing cells, like cancer cells, and causes these cells to die. This medicine is used to treat many types of cancers like breast cancer, certain stomach cancers, head and neck cancer, lung cancer, and prostate cancer. This medicine may be used for other purposes; ask your health care provider or pharmacist if you have questions. COMMON BRAND NAME(S): Docefrez, Taxotere What should I tell my health care provider before I take this medicine? They need to know if you have any of these conditions:  infection (especially a virus infection such as chickenpox, cold sores, or herpes)  liver disease  low blood counts, like low white cell, platelet, or red cell counts  an unusual or allergic reaction to docetaxel, polysorbate 80, other chemotherapy agents, other medicines, foods, dyes, or preservatives  pregnant or trying to get pregnant  breast-feeding How should I use this medicine? This drug is given as an infusion into a vein. It is administered in a hospital or clinic by a specially trained health care professional. Talk to your pediatrician regarding the use of this medicine in children. Special care may be needed. Overdosage: If you think you have taken too much of this medicine contact a poison control center or emergency room at once. NOTE: This medicine is only for you. Do not share this medicine with others. What if I miss a dose? It is important not to miss your dose. Call your doctor or health care professional if you are unable to keep an appointment. What may interact with this medicine?  aprepitant  certain antibiotics like erythromycin or clarithromycin  certain antivirals for HIV or hepatitis  certain medicines for fungal infections like fluconazole, itraconazole, ketoconazole, posaconazole, or  voriconazole  cimetidine  ciprofloxacin  conivaptan  cyclosporine  dronedarone  fluvoxamine  grapefruit juice  imatinib  verapamil This list may not describe all possible interactions. Give your health care provider a list of all the medicines, herbs, non-prescription drugs, or dietary supplements you use. Also tell them if you smoke, drink alcohol, or use illegal drugs. Some items may interact with your medicine. What should I watch for while using this medicine? Your condition will be monitored carefully while you are receiving this medicine. You will need important blood work done while you are taking this medicine. Call your doctor or health care professional for advice if you get a fever, chills or sore throat, or other symptoms of a cold or flu. Do not treat yourself. This drug decreases your body's ability to fight infections. Try to avoid being around people who are sick. Some products may contain alcohol. Ask your health care professional if this medicine contains alcohol. Be sure to tell all health care professionals you are taking this medicine. Certain medicines, like metronidazole and disulfiram, can cause an unpleasant reaction when taken with alcohol. The reaction includes flushing, headache, nausea, vomiting, sweating, and increased thirst. The reaction can last from 30 minutes to several hours. You may get drowsy or dizzy. Do not drive, use machinery, or do anything that needs mental alertness until you know how this medicine affects you. Do not stand or sit up quickly, especially if you are an older patient. This reduces the risk of dizzy or fainting spells. Alcohol may interfere with the effect of this medicine. Talk to your health care professional about your risk of cancer. You may be more at risk for certain types of cancer if  you take this medicine. Do not become pregnant while taking this medicine or for 6 months after stopping it. Women should inform their doctor if  they wish to become pregnant or think they might be pregnant. There is a potential for serious side effects to an unborn child. Talk to your health care professional or pharmacist for more information. Do not breast-feed an infant while taking this medicine or for 1 week after stopping it. Males who get this medicine must use a condom during sex with females who can get pregnant. If you get a woman pregnant, the baby could have birth defects. The baby could die before they are born. You will need to continue wearing a condom for 3 months after stopping the medicine. Tell your health care provider right away if your partner becomes pregnant while you are taking this medicine. This may interfere with the ability to father a child. You should talk to your doctor or health care professional if you are concerned about your fertility. What side effects may I notice from receiving this medicine? Side effects that you should report to your doctor or health care professional as soon as possible:  allergic reactions like skin rash, itching or hives, swelling of the face, lips, or tongue  blurred vision  breathing problems  changes in vision  low blood counts - This drug may decrease the number of white blood cells, red blood cells and platelets. You may be at increased risk for infections and bleeding.  nausea and vomiting  pain, redness or irritation at site where injected  pain, tingling, numbness in the hands or feet  redness, blistering, peeling, or loosening of the skin, including inside the mouth  signs of decreased platelets or bleeding - bruising, pinpoint red spots on the skin, black, tarry stools, nosebleeds  signs of decreased red blood cells - unusually weak or tired, fainting spells, lightheadedness  signs of infection - fever or chills, cough, sore throat, pain or difficulty passing urine  swelling of the ankle, feet, hands Side effects that usually do not require medical attention  (report to your doctor or health care professional if they continue or are bothersome):  constipation  diarrhea  fingernail or toenail changes  hair loss  loss of appetite  mouth sores  muscle pain This list may not describe all possible side effects. Call your doctor for medical advice about side effects. You may report side effects to FDA at 1-800-FDA-1088. Where should I keep my medicine? This drug is given in a hospital or clinic and will not be stored at home. NOTE: This sheet is a summary. It may not cover all possible information. If you have questions about this medicine, talk to your doctor, pharmacist, or health care provider.  2020 Elsevier/Gold Standard (2018-09-29 10:19:06) Cisplatin injection What is this medicine? CISPLATIN (SIS pla tin) is a chemotherapy drug. It targets fast dividing cells, like cancer cells, and causes these cells to die. This medicine is used to treat many types of cancer like bladder, ovarian, and testicular cancers. This medicine may be used for other purposes; ask your health care provider or pharmacist if you have questions. COMMON BRAND NAME(S): Platinol, Platinol -AQ What should I tell my health care provider before I take this medicine? They need to know if you have any of these conditions:  eye disease, vision problems  hearing problems  kidney disease  low blood counts, like white cells, platelets, or red blood cells  tingling of the fingers or  toes, or other nerve disorder  an unusual or allergic reaction to cisplatin, carboplatin, oxaliplatin, other medicines, foods, dyes, or preservatives  pregnant or trying to get pregnant  breast-feeding How should I use this medicine? This drug is given as an infusion into a vein. It is administered in a hospital or clinic by a specially trained health care professional. Talk to your pediatrician regarding the use of this medicine in children. Special care may be needed. Overdosage: If  you think you have taken too much of this medicine contact a poison control center or emergency room at once. NOTE: This medicine is only for you. Do not share this medicine with others. What if I miss a dose? It is important not to miss a dose. Call your doctor or health care professional if you are unable to keep an appointment. What may interact with this medicine? This medicine may interact with the following medications:  foscarnet  certain antibiotics like amikacin, gentamicin, neomycin, polymyxin B, streptomycin, tobramycin, vancomycin This list may not describe all possible interactions. Give your health care provider a list of all the medicines, herbs, non-prescription drugs, or dietary supplements you use. Also tell them if you smoke, drink alcohol, or use illegal drugs. Some items may interact with your medicine. What should I watch for while using this medicine? Your condition will be monitored carefully while you are receiving this medicine. You will need important blood work done while you are taking this medicine. This drug may make you feel generally unwell. This is not uncommon, as chemotherapy can affect healthy cells as well as cancer cells. Report any side effects. Continue your course of treatment even though you feel ill unless your doctor tells you to stop. This medicine may increase your risk of getting an infection. Call your healthcare professional for advice if you get a fever, chills, or sore throat, or other symptoms of a cold or flu. Do not treat yourself. Try to avoid being around people who are sick. Avoid taking medicines that contain aspirin, acetaminophen, ibuprofen, naproxen, or ketoprofen unless instructed by your healthcare professional. These medicines may hide a fever. This medicine may increase your risk to bruise or bleed. Call your doctor or health care professional if you notice any unusual bleeding. Be careful brushing and flossing your teeth or using a  toothpick because you may get an infection or bleed more easily. If you have any dental work done, tell your dentist you are receiving this medicine. Do not become pregnant while taking this medicine or for 14 months after stopping it. Women should inform their healthcare professional if they wish to become pregnant or think they might be pregnant. Men should not father a child while taking this medicine and for 11 months after stopping it. There is potential for serious side effects to an unborn child. Talk to your healthcare professional for more information. Do not breast-feed an infant while taking this medicine. This medicine has caused ovarian failure in some women. This medicine may make it more difficult to get pregnant. Talk to your healthcare professional if you are concerned about your fertility. This medicine has caused decreased sperm counts in some men. This may make it more difficult to father a child. Talk to your healthcare professional if you are concerned about your fertility. Drink fluids as directed while you are taking this medicine. This will help protect your kidneys. Call your doctor or health care professional if you get diarrhea. Do not treat yourself. What side effects  may I notice from receiving this medicine? Side effects that you should report to your doctor or health care professional as soon as possible:  allergic reactions like skin rash, itching or hives, swelling of the face, lips, or tongue  blurred vision  changes in vision  decreased hearing or ringing of the ears  nausea, vomiting  pain, redness, or irritation at site where injected  pain, tingling, numbness in the hands or feet  signs and symptoms of bleeding such as bloody or black, tarry stools; red or dark brown urine; spitting up blood or brown material that looks like coffee grounds; red spots on the skin; unusual bruising or bleeding from the eyes, gums, or nose  signs and symptoms of infection  like fever; chills; cough; sore throat; pain or trouble passing urine  signs and symptoms of kidney injury like trouble passing urine or change in the amount of urine  signs and symptoms of low red blood cells or anemia such as unusually weak or tired; feeling faint or lightheaded; falls; breathing problems Side effects that usually do not require medical attention (report to your doctor or health care professional if they continue or are bothersome):  loss of appetite  mouth sores  muscle cramps This list may not describe all possible side effects. Call your doctor for medical advice about side effects. You may report side effects to FDA at 1-800-FDA-1088. Where should I keep my medicine? This drug is given in a hospital or clinic and will not be stored at home. NOTE: This sheet is a summary. It may not cover all possible information. If you have questions about this medicine, talk to your doctor, pharmacist, or health care provider.  2020 Elsevier/Gold Standard (2018-01-28 15:59:17)

## 2019-11-14 NOTE — Patient Instructions (Signed)

## 2019-11-14 NOTE — Progress Notes (Signed)
Hematology and Oncology Follow Up Visit  Brian [name] [id number] 11-27-1975 44 y.o. 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  Overall, he is doing quite nicely right now. This is probably best to have seen him look. [place] the beach. He and his family were down there last week. That a wonderful time. He ate well. He had no problems with increasing fatigue or weakness.  His pain seems be doing a little bit better.  He has had no problems with nausea or vomiting. He has had no problems with leg swelling. His blood pressure seems to be doing a little bit better.  Overall, I would say his performance status is ECOG 0. \   Medications:  Current Outpatient Medications:  .  baclofen (LIORESAL) 10 MG tablet, Take 1 tablet (10 mg total) by mouth 3 (three) times daily., Disp: 30 each, Rfl: 2 .  cloNIDine (CATAPRES) 0.1 MG tablet, Take 0.1 mg by mouth 2 (two) times daily. , Disp: , Rfl:  .  cloNIDine (CATAPRES) 0.2 MG tablet, Take 0.2 mg by mouth 3 times/day as needed-between meals & bedtime. , Disp: , Rfl:  .  cyclobenzaprine (FLEXERIL) 10 MG tablet, Take 1 tablet (10 mg total) by mouth 3 (three) times daily as needed for muscle spasms., Disp: 30 tablet, Rfl: 3 .  dexamethasone (DECADRON) 4 MG tablet, TAKE 1 TABLET (4 MG TOTAL) BY MOUTH 2 (TWO) TIMES DAILY WITH A MEAL. TAKE 2 TABS TWICE A DAY FOR 4 DAYS -- START DAY BEFORE EACH CHEMOTHERAPY, Disp: 60 tablet, Rfl: 1 .  EPINEPHrine (EPIPEN 2-PAK) 0.3 mg/0.3 mL IJ SOAJ injection, USE AS DIRECTED FOR LIFE THREATENING ALLERGIC REACTIONS, Disp: 4 Device, Rfl: 3 .  furosemide (LASIX) 80 MG tablet,  Take 1 tablet (80 mg total) by mouth daily. (Patient taking differently: Take 80 mg by mouth daily as needed for fluid. ), Disp: 30 tablet, Rfl: 1 .  gabapentin (NEURONTIN) 300 MG capsule, TAKE 1 CAPSULE (300 MG TOTAL) BY MOUTH 3 (THREE) TIMES DAILY., Disp: 90 capsule, Rfl: 2 .  levofloxacin (LEVAQUIN) 500 MG tablet, Take 1 tablet (500 mg total) by mouth daily. Take daily for 14 days -- start day AFTER each cehmotherapy, Disp: 14 tablet, Rfl: 6 .  levothyroxine (SYNTHROID) 200 MCG tablet, Take by mouth daily. , Disp: , Rfl:  .  levothyroxine (SYNTHROID) 88 MCG tablet, Take by mouth., Disp: , Rfl:  .  lidocaine-prilocaine (EMLA) cream, Apply to affected area once, Disp: 30 g, Rfl: 3 .  LORazepam (ATIVAN) 0.5 MG tablet, TAKE 1 TABLET BY MOUTH EVERY 6 HOURS AS NEEDED FOR NAUSEA AND/OR VOMITING, Disp: 30 tablet, Rfl: 0 .  losartan (COZAAR) 25 MG tablet, Take 1 tablet (25 mg total) by mouth 2 (two) times daily., Disp: 60 tablet, Rfl: 5 .  magic mouthwash SOLN, Take 10 mLs by mouth 4 (four) times daily as needed for mouth pain., Disp: 400 mL, Rfl: 1 .  methylphenidate (RITALIN) 10 MG tablet, TAKE 1 TABLET (10 MG TOTAL) BY MOUTH 2 (TWO) TIMES DAILY., Disp: 60 tablet,  Rfl: 0 .  metoCLOPramide (REGLAN) 10 MG tablet, TAKE 1 TABLET BY MOUTH FOUR TIMES DAILY BEFORE MEALS AND AT BEDTIME, Disp: 120 tablet, Rfl: 0 .  metolazone (ZAROXOLYN) 5 MG tablet, Take 1 tablet (5 mg total) by mouth daily. Take 1 hour before Lasix. (Patient taking differently: Take 5 mg by mouth daily as needed (leg swelling). Take 1 hour before Lasix.), Disp: 30 tablet, Rfl: 1 .  Multiple Vitamin (MULTIVITAMIN) capsule, Take 1 capsule by mouth daily., Disp: , Rfl:  .  OLANZapine (ZYPREXA) 10 MG tablet, Take 1 tablet (10 mg total) by mouth at bedtime., Disp: 30 tablet, Rfl: 4 .  ondansetron (ZOFRAN) 8 MG tablet, Take 8 mg by mouth every 8 (eight) hours as needed for nausea or vomiting. , Disp: , Rfl:  .  oxyCODONE (OXYCONTIN) 20 mg 12 hr  tablet, Take 1 tablet (20 mg total) by mouth every 12 (twelve) hours., Disp: 60 tablet, Rfl: 0 .  Oxycodone HCl 10 MG TABS, Take 1 tablet (10 mg total) by mouth every 6 (six) hours as needed., Disp: 90 tablet, Rfl: 0 .  potassium chloride (KLOR-CON) 10 MEQ tablet, TAKE 1 TABLET (10 MEQ TOTAL) BY MOUTH DAILY., Disp: 30 tablet, Rfl: 1 .  prochlorperazine (COMPAZINE) 10 MG tablet, Take 1 tablet (10 mg total) by mouth every 6 (six) hours as needed (Nausea or vomiting)., Disp: 30 tablet, Rfl: 1 .  promethazine (PHENERGAN) 12.5 MG tablet, Take 1 tablet (12.5 mg total) by mouth at bedtime., Disp: 30 tablet, Rfl: 0 .  promethazine (PHENERGAN) 25 MG tablet, TAKE 1 TABLET BY MOUTH EVERY 6 HOURS AS NEEDED NAUSEA AND/OR VOMITING, Disp: 120 tablet, Rfl: 0 .  sucralfate (CARAFATE) 1 GM/10ML suspension, Take 10 mLs (1 g total) by mouth 4 (four) times daily -  with meals and at bedtime., Disp: 420 mL, Rfl: 1 .  TURMERIC CURCUMIN PO, Take 1 capsule by mouth daily., Disp: , Rfl:  .  isosorbide mononitrate (IMDUR) 60 MG 24 hr tablet, Take 1 tablet (60 mg total) by mouth daily., Disp: 90 tablet, Rfl: 3 No current facility-administered medications for this visit.  Facility-Administered Medications Ordered in Other Visits:  .  0.9 %  sodium chloride infusion, , Intravenous, Once, Ferrin Liebig, Rudell Cobb, MD .  HYDROmorphone (DILAUDID) injection 4 mg, 4 mg, Intravenous, Once, Bronda Alfred R, MD .  sodium chloride 0.9 % 1,000 mL with potassium chloride 20 mEq, magnesium sulfate 2 g, mannitol 25 % 12.5 g infusion, , Intravenous, Once, Laisha Rau, Rudell Cobb, MD  Allergies:  Allergies  Allergen Reactions  . Dextromethorphan-Guaifenesin Other (See Comments)    Irregular heartbeat   . Doxycycline Anaphylaxis, Nausea And Vomiting, Rash and Other (See Comments)    "heart arrythmia" and "dyspepsia" (only oral doxycycline causes reaction)  . Gadobutrol Hives and Other (See Comments)    Patient had MRI scan at [place].  Patient called one hour after he left imaging facility to report two "blisters" that came up on "back" of lip.    . Gadolinium Derivatives Hives    Patient had MRI scan at [facility]. Patient called one hour after he left imaging facility to report two "blisters" that came up on "back" of lip.   . Guaifenesin Palpitations       . Ibuprofen Hives and Itching  . Lisinopril Other (See Comments)    Angioedema  . Pantoprazole Itching  . Tramadol Hives and Itching  . Barium Rash    Developed redness around neck after drinking  1st bottle of Barocat; pt was given Benedryl by ED Mds to be "on the safe side" before drinking 2nd bottle    Past Medical History, Surgical history, Social history, and Family History were reviewed and updated.  Review of Systems: Review of Systems  Constitutional: Negative.   HENT:   Positive for lump/mass and sore throat.   Eyes: Negative.   Respiratory: Negative.   Cardiovascular: Negative.   Gastrointestinal: Negative.   Endocrine: Negative.   Genitourinary: Negative.    Musculoskeletal: Negative.   Skin: Negative.   Neurological: Negative.   Hematological: Negative.   Psychiatric/Behavioral: Negative.     Physical Exam:  vitals were not taken for this visit.   Wt Readings from Last 3 Encounters:  10/24/19 270 lb 12.8 oz (122.8 kg)  09/06/19 269 lb (122 kg)  08/14/19 272 lb 4 oz (123.5 kg)   His vital signs show temperature of 98 7.  Pulse 89.  Blood pressure 1201/85.  Weight was not taken.  Physical Exam Vitals reviewed.  HENT:     Head: Normocephalic and atraumatic.  Eyes:     Pupils: Pupils are equal, round, and reactive to light.  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  This is well-healing.  There is some slight fullness but no distinct mass noted in the neck. Cardiovascular:     Rate and Rhythm: Normal rate and regular rhythm.     Heart sounds: Normal heart sounds.   Pulmonary:     Effort: Pulmonary effort is normal.     Breath sounds: Normal breath sounds.  Abdominal:     General: Bowel sounds are normal.     Palpations: Abdomen is soft.  Musculoskeletal:        General: Swelling present. No tenderness or deformity. Normal range of motion.     Cervical back: Normal range of motion.  Lymphadenopathy:     Cervical: No cervical adenopathy.  Skin:    General: Skin is warm and dry.     Findings: No erythema or rash.  Neurological:     Mental Status: He is alert and oriented to person, place, and time.  Psychiatric:        Behavior: Behavior normal.        Thought Content: Thought content normal.        Judgment: Judgment normal.      Lab Results  Component Value Date   WBC 7.6 11/14/2019   HGB 10.9 (L) 11/14/2019   HCT 34.0 (L) 11/14/2019   MCV 102.1 (H) 11/14/2019   PLT 161 11/14/2019     Chemistry      Component Value Date/Time   NA 137 10/31/2019 1420   NA 141 02/06/2019 1210   K 3.8 10/31/2019 1420   CL 103 10/31/2019 1420   CO2 27 10/31/2019 1420   BUN 14 10/31/2019 1420   BUN 8 02/06/2019 1210   CREATININE 0.90 10/31/2019 1420      Component Value Date/Time   CALCIUM 9.0 10/31/2019 1420   ALKPHOS 94 10/31/2019 1420   AST 14 (L) 10/31/2019 1420   ALT 15 10/31/2019 1420   BILITOT 0.5 10/31/2019 1420      Impression and Plan: Brian [name] is a 44 year old white male.  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  Again, his thyroid cancer is RET mutated.  I am glad to see that the he is tolerating treatment  well. He really has done nicely.  After this fourth cycle of treatment, we will set him up with an MRI. This will tell us it how things are looking.  If he has continued shrinkage, I may see if Dr. Crisoforo Oxford of surgical oncology might consider doing any kind of partial hepatic resection.  I told Brian [name] that the most chemotherapy that I would use would be probably 6 cycles.  I know that he is can  start a new job. Hopefully, we will be able to coordinate everything so that he will not miss the opportunity to start his new job.   [name], MD 9/28/20218:29 AM

## 2019-11-14 NOTE — Progress Notes (Signed)
   Covid-19 Vaccination Clinic  Name:  Brian [name]    MRN: [id number] DOB: September 30, 1975  11/14/2019  Brian [name] was observed post Covid-19 immunization for 15 minutes without incident. He was provided with Vaccine Information Sheet and instruction to access the V-Safe system. Vaccinated By: Olin Hauser Ward  Brian [name] was instructed to call 911 with any severe reactions post vaccine: Marland Kitchen Difficulty breathing  . Swelling of face and throat  . A fast heartbeat  . A bad rash all over body  . Dizziness and weakness   Immunizations Administered    Name Date Dose VIS Date Route   JANSSEN COVID-19 VACCINE 11/14/2019  3:43 PM 0.5 mL 04/15/2019 Intramuscular   Manufacturer: [employer]   Lot: [id number]   [place]: [id number]

## 2019-11-16 ENCOUNTER — Inpatient Hospital Stay: Payer: 59

## 2019-11-16 ENCOUNTER — Other Ambulatory Visit: Payer: Self-pay | Admitting: *Deleted

## 2019-11-16 ENCOUNTER — Other Ambulatory Visit: Payer: Self-pay

## 2019-11-16 VITALS — BP 130/92 | HR 94 | Temp 98.1°F | Resp 17

## 2019-11-16 DIAGNOSIS — Z5111 Encounter for antineoplastic chemotherapy: Secondary | ICD-10-CM | POA: Diagnosis not present

## 2019-11-16 DIAGNOSIS — C787 Secondary malignant neoplasm of liver and intrahepatic bile duct: Secondary | ICD-10-CM | POA: Diagnosis not present

## 2019-11-16 DIAGNOSIS — Z5189 Encounter for other specified aftercare: Secondary | ICD-10-CM | POA: Diagnosis not present

## 2019-11-16 DIAGNOSIS — C73 Malignant neoplasm of thyroid gland: Secondary | ICD-10-CM | POA: Diagnosis not present

## 2019-11-16 DIAGNOSIS — Z79899 Other long term (current) drug therapy: Secondary | ICD-10-CM | POA: Diagnosis not present

## 2019-11-16 MED ORDER — PREDNISONE 50 MG PO TABS: ORAL_TABLET | ORAL | 0 refills | Status: DC

## 2019-11-16 MED ORDER — PEGFILGRASTIM-JMDB 6 MG/0.6ML ~~LOC~~ SOSY
6.0000 mg | PREFILLED_SYRINGE | Freq: Once | SUBCUTANEOUS | Status: AC
  Administered 2019-11-16: 6 mg via SUBCUTANEOUS

## 2019-11-16 NOTE — Patient Instructions (Signed)

## 2019-11-24 ENCOUNTER — Other Ambulatory Visit (HOSPITAL_BASED_OUTPATIENT_CLINIC_OR_DEPARTMENT_OTHER): Payer: Self-pay | Admitting: Hematology & Oncology

## 2019-11-24 ENCOUNTER — Other Ambulatory Visit: Payer: Self-pay | Admitting: Family

## 2019-11-24 DIAGNOSIS — R600 Localized edema: Secondary | ICD-10-CM

## 2019-11-24 DIAGNOSIS — C73 Malignant neoplasm of thyroid gland: Secondary | ICD-10-CM

## 2019-11-27 ENCOUNTER — Encounter: Payer: Self-pay | Admitting: *Deleted

## 2019-11-27 ENCOUNTER — Other Ambulatory Visit: Payer: Self-pay | Admitting: *Deleted

## 2019-11-27 ENCOUNTER — Other Ambulatory Visit (HOSPITAL_BASED_OUTPATIENT_CLINIC_OR_DEPARTMENT_OTHER): Payer: Self-pay | Admitting: Hematology & Oncology

## 2019-11-27 DIAGNOSIS — R112 Nausea with vomiting, unspecified: Secondary | ICD-10-CM

## 2019-11-27 DIAGNOSIS — C73 Malignant neoplasm of thyroid gland: Secondary | ICD-10-CM

## 2019-11-27 DIAGNOSIS — M130 Polyarthritis, unspecified: Secondary | ICD-10-CM

## 2019-11-27 DIAGNOSIS — T451X5A Adverse effect of antineoplastic and immunosuppressive drugs, initial encounter: Secondary | ICD-10-CM

## 2019-11-27 MED ORDER — CYCLOBENZAPRINE HCL 10 MG PO TABS: 10.0000 mg | ORAL_TABLET | Freq: Three times a day (TID) | ORAL | 3 refills | Status: DC | PRN

## 2019-11-27 MED ORDER — OLANZAPINE 10 MG PO TABS: 10.0000 mg | ORAL_TABLET | Freq: Every day | ORAL | 4 refills | Status: DC

## 2019-11-27 MED ORDER — METHYLPHENIDATE HCL 10 MG PO TABS: 10.0000 mg | ORAL_TABLET | Freq: Two times a day (BID) | ORAL | 0 refills | Status: DC

## 2019-11-27 MED ORDER — OXYCODONE HCL ER 20 MG PO T12A: 20.0000 mg | EXTENDED_RELEASE_TABLET | Freq: Two times a day (BID) | ORAL | 0 refills | Status: DC

## 2019-11-27 MED ORDER — OXYCODONE HCL 10 MG PO TABS: 10.0000 mg | ORAL_TABLET | Freq: Four times a day (QID) | ORAL | 0 refills | Status: DC | PRN

## 2019-11-28 ENCOUNTER — Ambulatory Visit (HOSPITAL_COMMUNITY)
Admission: RE | Admit: 2019-11-28 | Discharge: 2019-11-28 | Disposition: A | Discharge status: Home / Self Care | Payer: 59 | Source: Ambulatory Visit | Attending: Hematology & Oncology | Admitting: Hematology & Oncology

## 2019-11-28 ENCOUNTER — Other Ambulatory Visit: Payer: Self-pay

## 2019-11-28 DIAGNOSIS — C73 Malignant neoplasm of thyroid gland: Secondary | ICD-10-CM | POA: Insufficient documentation

## 2019-11-28 DIAGNOSIS — K7689 Other specified diseases of liver: Secondary | ICD-10-CM | POA: Diagnosis not present

## 2019-11-28 MED ORDER — GADOBUTROL 1 MMOL/ML IV SOLN
10.0000 mL | Freq: Once | INTRAVENOUS | Status: AC | PRN
  Administered 2019-11-28: 10 mL via INTRAVENOUS

## 2019-12-04 ENCOUNTER — Telehealth: Payer: Self-pay | Admitting: Hematology & Oncology

## 2019-12-04 ENCOUNTER — Other Ambulatory Visit: Payer: Self-pay | Admitting: Hematology & Oncology

## 2019-12-04 NOTE — Telephone Encounter (Signed)
I called [name] today.  I talked him about how we can try to help with his metastatic thyroid cancer.  I think that would be reasonable to try single agent Adriamycin.  I think this does have a little bit of a track record with metastatic thyroid cancer.  He has never had Adriamycin before.  His prior regimen was platinum with Taxotere.  He had a partial response to this before he began to progress.  I think with Adriamycin, it would be amenable to his schedule.  I would treat him weekly for 3 weeks on and 1 week off.  Have had a echocardiogram done back in December of last year.  He has not had anything that it was cardiotoxic since then.  As such, I think the echocardiogram and LVEF that was done at that time would be reasonable to use for now.  We will try to get the Adriamycin started this week.  I know he has an appointment tomorrow.  Hopefully we can do the Adriamycin tomorrow.  I spoke about some of the side effects.  I also talked about the low blood counts.  We will have to watch his blood counts closely.  Hopefully, neutropenia will not be an issue.  I think that the chance of responding to Adriamycin hopefully will be 20-25%.  I know that we are trying to get him on the RET inhibitor.  Unfortunately, his insurance is not going to give [place] an easy approval for this.  However, we will still try to obtain this if we see that the Adriamycin is now working was not tolerated.  Lattie Haw, MD

## 2019-12-05 ENCOUNTER — Inpatient Hospital Stay: Payer: 59

## 2019-12-05 ENCOUNTER — Other Ambulatory Visit: Payer: Self-pay

## 2019-12-05 ENCOUNTER — Inpatient Hospital Stay: Payer: 59 | Attending: Hematology & Oncology

## 2019-12-05 ENCOUNTER — Other Ambulatory Visit (HOSPITAL_BASED_OUTPATIENT_CLINIC_OR_DEPARTMENT_OTHER): Payer: Self-pay | Admitting: Hematology & Oncology

## 2019-12-05 VITALS — BP 126/83 | HR 83 | Temp 97.9°F | Resp 19

## 2019-12-05 VITALS — BP 134/84 | HR 103 | Temp 98.7°F | Resp 18

## 2019-12-05 DIAGNOSIS — Z5111 Encounter for antineoplastic chemotherapy: Secondary | ICD-10-CM | POA: Insufficient documentation

## 2019-12-05 DIAGNOSIS — C787 Secondary malignant neoplasm of liver and intrahepatic bile duct: Secondary | ICD-10-CM | POA: Insufficient documentation

## 2019-12-05 DIAGNOSIS — C73 Malignant neoplasm of thyroid gland: Secondary | ICD-10-CM | POA: Insufficient documentation

## 2019-12-05 LAB — CMP (CANCER CENTER ONLY)
ALT: 22 U/L (ref 0–44)
AST: 16 U/L (ref 15–41)
Albumin: 3.7 g/dL (ref 3.5–5.0)
Alkaline Phosphatase: 78 U/L (ref 38–126)
Anion gap: 6 (ref 5–15)
BUN: 22 mg/dL — ABNORMAL HIGH (ref 6–20)
CO2: 31 mmol/L (ref 22–32)
Calcium: 9.3 mg/dL (ref 8.9–10.3)
Chloride: 103 mmol/L (ref 98–111)
Creatinine: 1 mg/dL (ref 0.61–1.24)
GFR, Estimated: >60 mL/min (ref >60)
Glucose, Bld: 134 mg/dL — ABNORMAL HIGH (ref 70–99)
Potassium: 4.4 mmol/L (ref 3.5–5.1)
Sodium: 140 mmol/L (ref 135–145)
Total Bilirubin: 0.4 mg/dL (ref 0.3–1.2)
Total Protein: 5.7 g/dL — ABNORMAL LOW (ref 6.5–8.1)

## 2019-12-05 LAB — CBC WITH DIFFERENTIAL (CANCER CENTER ONLY)
Abs Immature Granulocytes: 0.24 10*3/uL — ABNORMAL HIGH (ref 0.00–0.07)
Basophils Absolute: 0 10*3/uL (ref 0.0–0.1)
Basophils Relative: 1 %
Eosinophils Absolute: 0 10*3/uL (ref 0.0–0.5)
Eosinophils Relative: 1 %
HCT: 30.2 % — ABNORMAL LOW (ref 39.0–52.0)
Hemoglobin: 9.7 g/dL — ABNORMAL LOW (ref 13.0–17.0)
Immature Granulocytes: 5 %
Lymphocytes Relative: 18 %
Lymphs Abs: 1 10*3/uL (ref 0.7–4.0)
MCH: 34 pg (ref 26.0–34.0)
MCHC: 32.1 g/dL (ref 30.0–36.0)
MCV: 106 fL — ABNORMAL HIGH (ref 80.0–100.0)
Monocytes Absolute: 0.5 10*3/uL (ref 0.1–1.0)
Monocytes Relative: 9 %
Neutro Abs: 3.5 10*3/uL (ref 1.7–7.7)
Neutrophils Relative %: 66 %
Platelet Count: 154 10*3/uL (ref 150–400)
RBC: 2.85 MIL/uL — ABNORMAL LOW (ref 4.22–5.81)
RDW: 17.8 % — ABNORMAL HIGH (ref 11.5–15.5)
WBC Count: 5.2 10*3/uL (ref 4.0–10.5)
nRBC: 0 % (ref 0.0–0.2)

## 2019-12-05 LAB — LACTATE DEHYDROGENASE: LDH: 346 U/L — ABNORMAL HIGH (ref 98–192)

## 2019-12-05 LAB — MAGNESIUM: Magnesium: 1.4 mg/dL — ABNORMAL LOW (ref 1.7–2.4)

## 2019-12-05 MED ORDER — PROCHLORPERAZINE MALEATE 10 MG PO TABS: 10.0000 mg | ORAL_TABLET | Freq: Four times a day (QID) | ORAL | 1 refills | Status: DC | PRN

## 2019-12-05 MED ORDER — DOXORUBICIN HCL CHEMO IV INJECTION 2 MG/ML
25.0000 mg/m2 | Freq: Once | INTRAVENOUS | Status: AC
  Administered 2019-12-05: 62 mg via INTRAVENOUS
  Filled 2019-12-05: qty 31

## 2019-12-05 MED ORDER — SODIUM CHLORIDE 0.9 % IV SOLN
10.0000 mg | Freq: Once | INTRAVENOUS | Status: AC
  Administered 2019-12-05: 10 mg via INTRAVENOUS
  Filled 2019-12-05: qty 10

## 2019-12-05 MED ORDER — PALONOSETRON HCL INJECTION 0.25 MG/5ML
0.2500 mg | Freq: Once | INTRAVENOUS | Status: AC
  Administered 2019-12-05: 0.25 mg via INTRAVENOUS

## 2019-12-05 MED ORDER — LIDOCAINE-PRILOCAINE 2.5-2.5 % EX CREA: TOPICAL_CREAM | CUTANEOUS | 3 refills | Status: DC

## 2019-12-05 MED ORDER — DEXAMETHASONE 4 MG PO TABS: 8.0000 mg | ORAL_TABLET | Freq: Every day | ORAL | 1 refills | Status: DC

## 2019-12-05 MED ORDER — LORAZEPAM 0.5 MG PO TABS: 0.5000 mg | ORAL_TABLET | Freq: Four times a day (QID) | ORAL | 0 refills | Status: DC | PRN

## 2019-12-05 MED ORDER — ONDANSETRON HCL 8 MG PO TABS: 8.0000 mg | ORAL_TABLET | Freq: Two times a day (BID) | ORAL | 1 refills | Status: DC | PRN

## 2019-12-05 MED ORDER — SODIUM CHLORIDE 0.9% FLUSH
10.0000 mL | INTRAVENOUS | Status: DC | PRN
  Administered 2019-12-05: 10 mL
  Filled 2019-12-05: qty 10

## 2019-12-05 MED ORDER — SODIUM CHLORIDE 0.9 % IV SOLN
Freq: Once | INTRAVENOUS | Status: AC
  Filled 2019-12-05: qty 250

## 2019-12-05 MED ORDER — HEPARIN SOD (PORK) LOCK FLUSH 100 UNIT/ML IV SOLN
500.0000 [IU] | Freq: Once | INTRAVENOUS | Status: AC | PRN
  Administered 2019-12-05: 500 [IU]
  Filled 2019-12-05: qty 5

## 2019-12-05 MED ORDER — PALONOSETRON HCL INJECTION 0.25 MG/5ML
INTRAVENOUS | Status: AC
  Filled 2019-12-05: qty 5

## 2019-12-05 NOTE — Progress Notes (Signed)
Pt discharged in no apparent distress. Pt left ambulatory without assistance. Pt aware of discharge instructions and verbalized understanding and had no further questions.  

## 2019-12-05 NOTE — Progress Notes (Signed)
Pt. Stable upon discharge.

## 2019-12-05 NOTE — Patient Instructions (Signed)
Doxorubicin injection What is this medicine? DOXORUBICIN (dox oh ROO bi sin) is a chemotherapy drug. It is used to treat many kinds of cancer like leukemia, lymphoma, neuroblastoma, sarcoma, and Wilms' tumor. It is also used to treat bladder cancer, breast cancer, lung cancer, ovarian cancer, stomach cancer, and thyroid cancer. This medicine may be used for other purposes; ask your health care provider or pharmacist if you have questions. COMMON BRAND NAME(S): Adriamycin, Adriamycin PFS, Adriamycin RDF, Rubex What should I tell my health care provider before I take this medicine? They need to know if you have any of these conditions:  heart disease  history of low blood counts caused by a medicine  liver disease  recent or ongoing radiation therapy  an unusual or allergic reaction to doxorubicin, other chemotherapy agents, other medicines, foods, dyes, or preservatives  pregnant or trying to get pregnant  breast-feeding How should I use this medicine? This drug is given as an infusion into a vein. It is administered in a hospital or clinic by a specially trained health care professional. If you have pain, swelling, burning or any unusual feeling around the site of your injection, tell your health care professional right away. Talk to your pediatrician regarding the use of this medicine in children. Special care may be needed. Overdosage: If you think you have taken too much of this medicine contact a poison control center or emergency room at once. NOTE: This medicine is only for you. Do not share this medicine with others. What if I miss a dose? It is important not to miss your dose. Call your doctor or health care professional if you are unable to keep an appointment. What may interact with this medicine? This medicine may interact with the following medications:  6-mercaptopurine  paclitaxel  phenytoin  [place]'s Wort  trastuzumab  verapamil This list may not describe  all possible interactions. Give your health care provider a list of all the medicines, herbs, non-prescription drugs, or dietary supplements you use. Also tell them if you smoke, drink alcohol, or use illegal drugs. Some items may interact with your medicine. What should I watch for while using this medicine? This drug may make you feel generally unwell. This is not uncommon, as chemotherapy can affect healthy cells as well as cancer cells. Report any side effects. Continue your course of treatment even though you feel ill unless your doctor tells you to stop. There is a maximum amount of this medicine you should receive throughout your life. The amount depends on the medical condition being treated and your overall health. Your doctor will watch how much of this medicine you receive in your lifetime. Tell your doctor if you have taken this medicine before. You may need blood work done while you are taking this medicine. Your urine may turn red for a few days after your dose. This is not blood. If your urine is dark or brown, call your doctor. In some cases, you may be given additional medicines to help with side effects. Follow all directions for their use. Call your doctor or health care professional for advice if you get a fever, chills or sore throat, or other symptoms of a cold or flu. Do not treat yourself. This drug decreases your body's ability to fight infections. Try to avoid being around people who are sick. This medicine may increase your risk to bruise or bleed. Call your doctor or health care professional if you notice any unusual bleeding. Talk to your doctor  about your risk of cancer. You may be more at risk for certain types of cancers if you take this medicine. Do not become pregnant while taking this medicine or for 6 months after stopping it. Women should inform their doctor if they wish to become pregnant or think they might be pregnant. Men should not father a child while taking this  medicine and for 6 months after stopping it. There is a potential for serious side effects to an unborn child. Talk to your health care professional or pharmacist for more information. Do not breast-feed an infant while taking this medicine. This medicine has caused ovarian failure in some women and reduced sperm counts in some men This medicine may interfere with the ability to have a child. Talk with your doctor or health care professional if you are concerned about your fertility. This medicine may cause a decrease in Co-Enzyme Q-10. You should make sure that you get enough Co-Enzyme Q-10 while you are taking this medicine. Discuss the foods you eat and the vitamins you take with your health care professional. What side effects may I notice from receiving this medicine? Side effects that you should report to your doctor or health care professional as soon as possible:  allergic reactions like skin rash, itching or hives, swelling of the face, lips, or tongue  breathing problems  chest pain  fast or irregular heartbeat  low blood counts - this medicine may decrease the number of white blood cells, red blood cells and platelets. You may be at increased risk for infections and bleeding.  pain, redness, or irritation at site where injected  signs of infection - fever or chills, cough, sore throat, pain or difficulty passing urine  signs of decreased platelets or bleeding - bruising, pinpoint red spots on the skin, black, tarry stools, blood in the urine  swelling of the ankles, feet, hands  tiredness  weakness Side effects that usually do not require medical attention (report to your doctor or health care professional if they continue or are bothersome):  diarrhea  hair loss  mouth sores  nail discoloration or damage  nausea  red colored urine  vomiting This list may not describe all possible side effects. Call your doctor for medical advice about side effects. You may report  side effects to FDA at 1-800-FDA-1088. Where should I keep my medicine? This drug is given in a hospital or clinic and will not be stored at home. NOTE: This sheet is a summary. It may not cover all possible information. If you have questions about this medicine, talk to your doctor, pharmacist, or health care provider.  2020 Elsevier/Gold Standard (2016-09-16 11:01:26)

## 2019-12-05 NOTE — Progress Notes (Signed)
PA okay for treatment today per Otilio Carpen, Patient Advocate.

## 2019-12-07 ENCOUNTER — Other Ambulatory Visit: Payer: Self-pay

## 2019-12-07 ENCOUNTER — Other Ambulatory Visit: Payer: Self-pay | Admitting: *Deleted

## 2019-12-07 ENCOUNTER — Inpatient Hospital Stay: Payer: 59

## 2019-12-07 ENCOUNTER — Other Ambulatory Visit (HOSPITAL_BASED_OUTPATIENT_CLINIC_OR_DEPARTMENT_OTHER): Payer: Self-pay | Admitting: Family

## 2019-12-07 DIAGNOSIS — C73 Malignant neoplasm of thyroid gland: Secondary | ICD-10-CM

## 2019-12-07 MED ORDER — LORAZEPAM 0.5 MG PO TABS: 0.5000 mg | ORAL_TABLET | Freq: Four times a day (QID) | ORAL | 0 refills | Status: DC | PRN

## 2019-12-11 ENCOUNTER — Inpatient Hospital Stay: Payer: 59 | Admitting: Hematology & Oncology

## 2019-12-11 ENCOUNTER — Inpatient Hospital Stay: Payer: 59

## 2019-12-15 ENCOUNTER — Telehealth: Payer: Self-pay | Admitting: Family

## 2019-12-15 ENCOUNTER — Inpatient Hospital Stay (HOSPITAL_BASED_OUTPATIENT_CLINIC_OR_DEPARTMENT_OTHER): Payer: 59 | Admitting: Family

## 2019-12-15 ENCOUNTER — Inpatient Hospital Stay: Payer: 59

## 2019-12-15 ENCOUNTER — Other Ambulatory Visit: Payer: Self-pay

## 2019-12-15 ENCOUNTER — Encounter: Payer: Self-pay | Admitting: Family

## 2019-12-15 ENCOUNTER — Other Ambulatory Visit: Payer: Self-pay | Admitting: Hematology & Oncology

## 2019-12-15 VITALS — BP 121/81 | HR 88 | Temp 98.3°F | Resp 18 | Wt 275.0 lb

## 2019-12-15 DIAGNOSIS — C73 Malignant neoplasm of thyroid gland: Secondary | ICD-10-CM

## 2019-12-15 DIAGNOSIS — Z5111 Encounter for antineoplastic chemotherapy: Secondary | ICD-10-CM | POA: Diagnosis not present

## 2019-12-15 DIAGNOSIS — C787 Secondary malignant neoplasm of liver and intrahepatic bile duct: Secondary | ICD-10-CM | POA: Diagnosis not present

## 2019-12-15 LAB — CBC WITH DIFFERENTIAL/PLATELET
Abs Immature Granulocytes: 0.13 10*3/uL — ABNORMAL HIGH (ref 0.00–0.07)
Basophils Absolute: 0 10*3/uL (ref 0.0–0.1)
Basophils Relative: 0 %
Eosinophils Absolute: 0 10*3/uL (ref 0.0–0.5)
Eosinophils Relative: 0 %
HCT: 32.7 % — ABNORMAL LOW (ref 39.0–52.0)
Hemoglobin: 10.6 g/dL — ABNORMAL LOW (ref 13.0–17.0)
Immature Granulocytes: 2 %
Lymphocytes Relative: 8 %
Lymphs Abs: 0.5 10*3/uL — ABNORMAL LOW (ref 0.7–4.0)
MCH: 33.4 pg (ref 26.0–34.0)
MCHC: 32.4 g/dL (ref 30.0–36.0)
MCV: 103.2 fL — ABNORMAL HIGH (ref 80.0–100.0)
Monocytes Absolute: 0.4 10*3/uL (ref 0.1–1.0)
Monocytes Relative: 6 %
Neutro Abs: 6 10*3/uL (ref 1.7–7.7)
Neutrophils Relative %: 84 %
Platelets: 176 10*3/uL (ref 150–400)
RBC: 3.17 MIL/uL — ABNORMAL LOW (ref 4.22–5.81)
RDW: 16.7 % — ABNORMAL HIGH (ref 11.5–15.5)
WBC: 7.1 10*3/uL (ref 4.0–10.5)
nRBC: 0.4 % — ABNORMAL HIGH (ref 0.0–0.2)

## 2019-12-15 LAB — COMPREHENSIVE METABOLIC PANEL
ALT: 20 U/L (ref 0–44)
AST: 13 U/L — ABNORMAL LOW (ref 15–41)
Albumin: 4.1 g/dL (ref 3.5–5.0)
Alkaline Phosphatase: 65 U/L (ref 38–126)
Anion gap: 7 (ref 5–15)
BUN: 32 mg/dL — ABNORMAL HIGH (ref 6–20)
CO2: 30 mmol/L (ref 22–32)
Calcium: 9.7 mg/dL (ref 8.9–10.3)
Chloride: 97 mmol/L — ABNORMAL LOW (ref 98–111)
Creatinine, Ser: 0.9 mg/dL (ref 0.61–1.24)
GFR, Estimated: >60 mL/min (ref >60)
Glucose, Bld: 129 mg/dL — ABNORMAL HIGH (ref 70–99)
Potassium: 4 mmol/L (ref 3.5–5.1)
Sodium: 134 mmol/L — ABNORMAL LOW (ref 135–145)
Total Bilirubin: 0.6 mg/dL (ref 0.3–1.2)
Total Protein: 6.3 g/dL — ABNORMAL LOW (ref 6.5–8.1)

## 2019-12-15 MED ORDER — SODIUM CHLORIDE 0.9 % IV SOLN
Freq: Once | INTRAVENOUS | Status: AC
  Filled 2019-12-15: qty 250

## 2019-12-15 MED ORDER — SODIUM CHLORIDE 0.9% FLUSH
10.0000 mL | INTRAVENOUS | Status: DC | PRN
  Administered 2019-12-15: 10 mL
  Filled 2019-12-15: qty 10

## 2019-12-15 MED ORDER — SODIUM CHLORIDE 0.9 % IV SOLN
10.0000 mg | Freq: Once | INTRAVENOUS | Status: AC
  Administered 2019-12-15: 10 mg via INTRAVENOUS
  Filled 2019-12-15: qty 10

## 2019-12-15 MED ORDER — PALONOSETRON HCL INJECTION 0.25 MG/5ML
0.2500 mg | Freq: Once | INTRAVENOUS | Status: AC
  Administered 2019-12-15: 0.25 mg via INTRAVENOUS

## 2019-12-15 MED ORDER — DOXORUBICIN HCL CHEMO IV INJECTION 2 MG/ML
25.0000 mg/m2 | Freq: Once | INTRAVENOUS | Status: AC
  Administered 2019-12-15: 62 mg via INTRAVENOUS
  Filled 2019-12-15: qty 31

## 2019-12-15 MED ORDER — HEPARIN SOD (PORK) LOCK FLUSH 100 UNIT/ML IV SOLN
500.0000 [IU] | Freq: Once | INTRAVENOUS | Status: AC | PRN
  Administered 2019-12-15: 500 [IU]
  Filled 2019-12-15: qty 5

## 2019-12-15 MED ORDER — PALONOSETRON HCL INJECTION 0.25 MG/5ML
INTRAVENOUS | Status: AC
  Filled 2019-12-15: qty 5

## 2019-12-15 NOTE — Patient Instructions (Addendum)
°  [facility] Discharge Instructions for Patients Receiving Chemotherapy  Today you received the following chemotherapy agents Doxorubicin  To help prevent nausea and vomiting after your treatment, we encourage you to take your nausea medication as prescribed by MD.   If you develop nausea and vomiting that is not controlled by your nausea medication, call the clinic.   BELOW ARE SYMPTOMS THAT SHOULD BE REPORTED IMMEDIATELY:  *FEVER GREATER THAN 100.5 F  *CHILLS WITH OR WITHOUT FEVER  NAUSEA AND VOMITING THAT IS NOT CONTROLLED WITH YOUR NAUSEA MEDICATION  *UNUSUAL SHORTNESS OF BREATH  *UNUSUAL BRUISING OR BLEEDING  TENDERNESS IN MOUTH AND THROAT WITH OR WITHOUT PRESENCE OF ULCERS  *URINARY PROBLEMS  *BOWEL PROBLEMS  UNUSUAL RASH Items with * indicate a potential emergency and should be followed up as soon as possible.  Feel free to call the clinic should you have any questions or concerns. The clinic phone number is [phone].  Please show the [facility] at check-in to the Emergency Department and triage nurse.

## 2019-12-15 NOTE — Patient Instructions (Signed)

## 2019-12-15 NOTE — Progress Notes (Signed)
Hematology and Oncology Follow Up Visit  Brian [name] [id number] October 31, 1975 44 y.o. 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 is here today for follow-up and cycle 2 with Adriamycin. He did well with week one of cycle one but did note extreme fatigue. He states that he rests as needed.  His nausea is controlled well on his current regiment of antiemetics.  He has had minimal SOB with over exertion.  No fever, chills, cough, rash, dizziness, chest pain, palpitations, abdominal pain or changes in bowel or bladder habits.  No episodes of bleeding. No abnormal bruising, no petechiae.  He states that Lasix has been effecting in controlling his fluid retention. Minimal puffiness in his lower extremities.  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  No falls or syncopal episodes to report.  He has a good appetite but admits that he needs to hydrate better throughout the day. His weight is stable at 275 lbs.   ECOG Performance Status: 1 - Symptomatic but completely ambulatory  Medications:  Allergies as of 12/15/2019      Reactions   Dextromethorphan-guaifenesin Other (See Comments)   Irregular heartbeat   Doxycycline Anaphylaxis, Nausea And Vomiting, Rash, Other (See Comments)   "heart arrythmia" and "dyspepsia" (only oral doxycycline causes reaction)   Gadobutrol Hives, Other (See Comments)   Patient had MRI scan at [facility]. Patient called one hour after he left imaging facility to report two "blisters" that came up on "back" of lip.    Gadolinium Derivatives Hives   Patient had MRI  scan at [facility]. Patient called one hour after he left imaging facility to report two "blisters" that came up on "back" of lip.    Guaifenesin Palpitations      Ibuprofen Hives, Itching   Lisinopril Other (See Comments)   Angioedema   Pantoprazole Itching   Tramadol Hives, Itching   Barium Rash   Developed redness around neck after drinking 1st bottle of Barocat; pt was given Benedryl by ED Mds to be "on the safe side" before drinking 2nd bottle      Medication List       Accurate as of December 15, 2019 11:38 AM. If you have any questions, ask your nurse or doctor.        baclofen 10 MG tablet Commonly known as: LIORESAL Take 1 tablet (10 mg total) by mouth 3 (three) times daily.   cloNIDine 0.1 MG tablet Commonly known as: CATAPRES Take 0.1 mg by mouth 2 (two) times daily.   cloNIDine 0.2 MG tablet Commonly known as: CATAPRES Take 0.2 mg by mouth 3 times/day as needed-between meals & bedtime.   cyclobenzaprine 10 MG tablet Commonly known as: FLEXERIL Take 1 tablet (10 mg total) by mouth 3 (three) times daily as needed for muscle spasms.   dexamethasone 4 MG tablet Commonly known as: DECADRON TAKE 1 TABLET (4 MG TOTAL) BY MOUTH 2 (TWO) TIMES DAILY WITH A MEAL. TAKE 2 TABS TWICE A DAY FOR 4 DAYS -- START DAY BEFORE EACH  CHEMOTHERAPY   dexamethasone 4 MG tablet Commonly known as: DECADRON Take 2 tablets (8 mg total) by mouth daily. Start the day after chemotherapy for 2 days.   EPINEPHrine 0.3 mg/0.3 mL Soaj injection Commonly known as: EpiPen 2-Pak USE AS DIRECTED FOR LIFE THREATENING ALLERGIC REACTIONS   furosemide 80 MG tablet Commonly known as: LASIX TAKE 1 TABLET (80 MG TOTAL) BY MOUTH DAILY.   gabapentin 300 MG capsule Commonly known as: NEURONTIN TAKE 1 CAPSULE (300 MG TOTAL) BY MOUTH 3 (THREE) TIMES DAILY.   isosorbide mononitrate 60 MG 24 hr tablet Commonly known as: IMDUR Take 1 tablet (60 mg total) by mouth daily.   levofloxacin 500 MG  tablet Commonly known as: Levaquin Take 1 tablet (500 mg total) by mouth daily. Take daily for 14 days -- start day AFTER each cehmotherapy   levothyroxine 200 MCG tablet Commonly known as: SYNTHROID Take by mouth daily.   levothyroxine 88 MCG tablet Commonly known as: SYNTHROID Take by mouth.   lidocaine-prilocaine cream Commonly known as: EMLA Apply to affected area once   LORazepam 0.5 MG tablet Commonly known as: Ativan Take 1 tablet (0.5 mg total) by mouth every 6 (six) hours as needed (Nausea or vomiting).   losartan 25 MG tablet Commonly known as: COZAAR Take 1 tablet (25 mg total) by mouth 2 (two) times daily.   magic mouthwash Soln Take 10 mLs by mouth 4 (four) times daily as needed for mouth pain.   methylphenidate 10 MG tablet Commonly known as: RITALIN Take 1 tablet (10 mg total) by mouth 2 (two) times daily.   metoCLOPramide 10 MG tablet Commonly known as: REGLAN TAKE 1 TABLET BY MOUTH FOUR TIMES DAILY BEFORE MEALS AND AT BEDTIME   metolazone 5 MG tablet Commonly known as: ZAROXOLYN TAKE 1 TABLET (5 MG TOTAL) BY MOUTH DAILY. TAKE 1 HOUR BEFORE LASIX.   multivitamin capsule Take 1 capsule by mouth daily.   OLANZapine 10 MG tablet Commonly known as: ZYPREXA Take 1 tablet (10 mg total) by mouth at bedtime.   ondansetron 8 MG tablet Commonly known as: ZOFRAN Take 8 mg by mouth every 8 (eight) hours as needed for nausea or vomiting.   ondansetron 8 MG tablet Commonly known as: Zofran Take 1 tablet (8 mg total) by mouth 2 (two) times daily as needed for refractory nausea / vomiting. Start on day 3 after chemo.   Oxycodone HCl 10 MG Tabs Take 1 tablet (10 mg total) by mouth every 6 (six) hours as needed.   oxyCODONE 20 mg 12 hr tablet Commonly known as: OXYCONTIN Take 1 tablet (20 mg total) by mouth every 12 (twelve) hours.   potassium chloride 10 MEQ tablet Commonly known as: KLOR-CON TAKE 1 TABLET (10 MEQ TOTAL) BY MOUTH DAILY.   predniSONE 50  MG tablet Commonly known as: DELTASONE 13 HOUR PREP - TAKE 1 TABLET BY MOUTH 13 HOURS,7 HOURS,AND 1 HOUR PRIOR TO SCAN;THE SCAN SHOULD BE PERFORMED WITHIN 6 HOURS OF LAST DOSE   prochlorperazine 10 MG tablet Commonly known as: COMPAZINE Take 1 tablet (10 mg total) by mouth every 6 (six) hours as needed (Nausea or vomiting).   promethazine 12.5 MG tablet Commonly known as: PHENERGAN Take 1 tablet (12.5 mg total) by mouth at bedtime.   promethazine 25 MG tablet Commonly known as: PHENERGAN TAKE 1 TABLET BY MOUTH EVERY 6 HOURS AS NEEDED NAUSEA AND/OR VOMITING   sucralfate 1 GM/10ML suspension Commonly known as: Carafate Take 10 mLs (1 g total) by  mouth 4 (four) times daily -  with meals and at bedtime.   TURMERIC CURCUMIN PO Take 1 capsule by mouth daily.       Allergies:  Allergies  Allergen Reactions  . Dextromethorphan-Guaifenesin Other (See Comments)    Irregular heartbeat   . Doxycycline Anaphylaxis, Nausea And Vomiting, Rash and Other (See Comments)    "heart arrythmia" and "dyspepsia" (only oral doxycycline causes reaction)  . Gadobutrol Hives and Other (See Comments)    Patient had MRI scan at [facility]. Patient called one hour after he left imaging facility to report two "blisters" that came up on "back" of lip.    . Gadolinium Derivatives Hives    Patient had MRI scan at [facility] [facility]. Patient called one hour after he left imaging facility to report two "blisters" that came up on "back" of lip.   . Guaifenesin Palpitations       . Ibuprofen Hives and Itching  . Lisinopril Other (See Comments)    Angioedema  . Pantoprazole Itching  . Tramadol Hives and Itching  . Barium Rash    Developed redness around neck after drinking 1st bottle of Barocat; pt was given Benedryl by ED Mds to be "on the safe side" before drinking 2nd bottle    Past Medical History, Surgical history, Social history, and Family History were reviewed and updated.  Review of  Systems: All other 10 point review of systems is negative.   Physical Exam:  weight is 275 lb (124.7 kg). His oral temperature is 98.3 F (36.8 C). His blood pressure is 121/81 and his pulse is 88. His respiration is 18 and oxygen saturation is 98%.   Wt Readings from Last 3 Encounters:  12/15/19 275 lb (124.7 kg)  11/14/19 268 lb 0.6 oz (121.6 kg)  10/24/19 270 lb 12.8 oz (122.8 kg)    Ocular: Sclerae unicteric, pupils equal, round and reactive to light Ear-nose-throat: Oropharynx clear, dentition fair Lymphatic: No cervical or supraclavicular adenopathy Lungs no rales or rhonchi, good excursion bilaterally Heart regular rate and rhythm, no murmur appreciated Abd soft, nontender, positive bowel sounds MSK no focal spinal tenderness, no joint edema Neuro: non-focal, well-oriented, appropriate affect Breasts: Deferred   Lab Results  Component Value Date   WBC 7.1 12/15/2019   HGB 10.6 (L) 12/15/2019   HCT 32.7 (L) 12/15/2019   MCV 103.2 (H) 12/15/2019   PLT 176 12/15/2019   No results found for: FERRITIN, IRON, TIBC, UIBC, IRONPCTSAT Lab Results  Component Value Date   RBC 3.17 (L) 12/15/2019   No results found for: KPAFRELGTCHN, LAMBDASER, KAPLAMBRATIO No results found for: IGGSERUM, IGA, IGMSERUM No results found for: Kathrynn Ducking, MSPIKE, SPEI   Chemistry      Component Value Date/Time   NA 134 (L) 12/15/2019 1049   NA 141 02/06/2019 1210   K 4.0 12/15/2019 1049   CL 97 (L) 12/15/2019 1049   CO2 30 12/15/2019 1049   BUN 32 (H) 12/15/2019 1049   BUN 8 02/06/2019 1210   CREATININE 0.90 12/15/2019 1049   CREATININE 1.00 12/05/2019 0823      Component Value Date/Time   CALCIUM 9.7 12/15/2019 1049   ALKPHOS 65 12/15/2019 1049   AST 13 (L) 12/15/2019 1049   AST 16 12/05/2019 0823   ALT 20 12/15/2019 1049   ALT 22 12/05/2019 0823   BILITOT 0.6 12/15/2019 1049   BILITOT 0.4 12/05/2019 0823       Impression and  Plan: Mr.  Brian [name] is a very pleasant 43 yo caucasian gentleman with an 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  He did well with week one of adriamycin and we will proceed with weeks two today as planned per Dr. Marin Olp.  After 2 full cycles (each cycle 3 weeks on 1 week off) we will repeat a scan on his to evaluate his response.  Follow-up in 3 weeks with the start of cycle 2.  [place] disability paperwork returned to [place].  He was encouraged to contact our office with any questions or concerns.   Laverna Peace, NP 10/29/202111:38 AM

## 2019-12-15 NOTE — Telephone Encounter (Signed)
Appointments scheduled & patient will get updates from My Chart per 10/29 los

## 2019-12-15 NOTE — Progress Notes (Signed)
Pt discharged in no apparent distress. Pt left ambulatory without assistance. Pt aware of discharge instructions and verbalized understanding and had no further questions.  

## 2019-12-20 ENCOUNTER — Inpatient Hospital Stay: Payer: 59

## 2019-12-20 ENCOUNTER — Other Ambulatory Visit: Payer: Self-pay

## 2019-12-20 ENCOUNTER — Other Ambulatory Visit: Payer: Self-pay | Admitting: Family

## 2019-12-20 ENCOUNTER — Inpatient Hospital Stay: Payer: 59 | Attending: Hematology & Oncology | Admitting: Family

## 2019-12-20 DIAGNOSIS — C73 Malignant neoplasm of thyroid gland: Secondary | ICD-10-CM | POA: Insufficient documentation

## 2019-12-20 DIAGNOSIS — Z5111 Encounter for antineoplastic chemotherapy: Secondary | ICD-10-CM | POA: Insufficient documentation

## 2019-12-20 DIAGNOSIS — C787 Secondary malignant neoplasm of liver and intrahepatic bile duct: Secondary | ICD-10-CM | POA: Insufficient documentation

## 2019-12-20 DIAGNOSIS — Z79899 Other long term (current) drug therapy: Secondary | ICD-10-CM | POA: Diagnosis not present

## 2019-12-20 LAB — COMPREHENSIVE METABOLIC PANEL
ALT: 18 U/L (ref 0–44)
AST: 13 U/L — ABNORMAL LOW (ref 15–41)
Albumin: 3.8 g/dL (ref 3.5–5.0)
Alkaline Phosphatase: 58 U/L (ref 38–126)
Anion gap: 7 (ref 5–15)
BUN: 24 mg/dL — ABNORMAL HIGH (ref 6–20)
CO2: 27 mmol/L (ref 22–32)
Calcium: 9.2 mg/dL (ref 8.9–10.3)
Chloride: 104 mmol/L (ref 98–111)
Creatinine, Ser: 0.81 mg/dL (ref 0.61–1.24)
GFR, Estimated: >60 mL/min (ref >60)
Glucose, Bld: 117 mg/dL — ABNORMAL HIGH (ref 70–99)
Potassium: 4 mmol/L (ref 3.5–5.1)
Sodium: 138 mmol/L (ref 135–145)
Total Bilirubin: 0.4 mg/dL (ref 0.3–1.2)
Total Protein: 5.8 g/dL — ABNORMAL LOW (ref 6.5–8.1)

## 2019-12-20 LAB — CBC WITH DIFFERENTIAL/PLATELET
Abs Immature Granulocytes: 0.02 10*3/uL (ref 0.00–0.07)
Basophils Absolute: 0 10*3/uL (ref 0.0–0.1)
Basophils Relative: 0 %
Eosinophils Absolute: 0 10*3/uL (ref 0.0–0.5)
Eosinophils Relative: 0 %
HCT: 33.4 % — ABNORMAL LOW (ref 39.0–52.0)
Hemoglobin: 10.9 g/dL — ABNORMAL LOW (ref 13.0–17.0)
Immature Granulocytes: 0 %
Lymphocytes Relative: 23 %
Lymphs Abs: 1.3 10*3/uL (ref 0.7–4.0)
MCH: 34.5 pg — ABNORMAL HIGH (ref 26.0–34.0)
MCHC: 32.6 g/dL (ref 30.0–36.0)
MCV: 105.7 fL — ABNORMAL HIGH (ref 80.0–100.0)
Monocytes Absolute: 0.3 10*3/uL (ref 0.1–1.0)
Monocytes Relative: 6 %
Neutro Abs: 3.9 10*3/uL (ref 1.7–7.7)
Neutrophils Relative %: 71 %
Platelets: 155 10*3/uL (ref 150–400)
RBC: 3.16 MIL/uL — ABNORMAL LOW (ref 4.22–5.81)
RDW: 16.1 % — ABNORMAL HIGH (ref 11.5–15.5)
WBC: 5.5 10*3/uL (ref 4.0–10.5)
nRBC: 0 % (ref 0.0–0.2)

## 2019-12-20 MED ORDER — SODIUM CHLORIDE 0.9% FLUSH
10.0000 mL | INTRAVENOUS | Status: DC | PRN
  Administered 2019-12-20: 10 mL
  Filled 2019-12-20: qty 10

## 2019-12-20 MED ORDER — HEPARIN SOD (PORK) LOCK FLUSH 100 UNIT/ML IV SOLN
500.0000 [IU] | Freq: Once | INTRAVENOUS | Status: AC | PRN
  Administered 2019-12-20: 500 [IU]
  Filled 2019-12-20: qty 5

## 2019-12-20 MED ORDER — PALONOSETRON HCL INJECTION 0.25 MG/5ML
INTRAVENOUS | Status: AC
  Filled 2019-12-20: qty 5

## 2019-12-20 MED ORDER — DOXORUBICIN HCL CHEMO IV INJECTION 2 MG/ML
25.0000 mg/m2 | Freq: Once | INTRAVENOUS | Status: AC
  Administered 2019-12-20: 62 mg via INTRAVENOUS
  Filled 2019-12-20: qty 31

## 2019-12-20 MED ORDER — SODIUM CHLORIDE 0.9 % IV SOLN
10.0000 mg | Freq: Once | INTRAVENOUS | Status: AC
  Administered 2019-12-20: 10 mg via INTRAVENOUS
  Filled 2019-12-20: qty 10

## 2019-12-20 MED ORDER — SODIUM CHLORIDE 0.9 % IV SOLN
Freq: Once | INTRAVENOUS | Status: AC
  Filled 2019-12-20: qty 250

## 2019-12-20 MED ORDER — PALONOSETRON HCL INJECTION 0.25 MG/5ML
0.2500 mg | Freq: Once | INTRAVENOUS | Status: AC
  Administered 2019-12-20: 0.25 mg via INTRAVENOUS

## 2019-12-20 NOTE — Patient Instructions (Signed)
  [facility] Discharge Instructions for Patients Receiving Chemotherapy  Today you received the following chemotherapy agents Doxorubicin  To help prevent nausea and vomiting after your treatment, we encourage you to take your nausea medication as prescribed by MD.   If you develop nausea and vomiting that is not controlled by your nausea medication, call the clinic.   BELOW ARE SYMPTOMS THAT SHOULD BE REPORTED IMMEDIATELY:  *FEVER GREATER THAN 100.5 F  *CHILLS WITH OR WITHOUT FEVER  NAUSEA AND VOMITING THAT IS NOT CONTROLLED WITH YOUR NAUSEA MEDICATION  *UNUSUAL SHORTNESS OF BREATH  *UNUSUAL BRUISING OR BLEEDING  TENDERNESS IN MOUTH AND THROAT WITH OR WITHOUT PRESENCE OF ULCERS  *URINARY PROBLEMS  *BOWEL PROBLEMS  UNUSUAL RASH Items with * indicate a potential emergency and should be followed up as soon as possible.  Feel free to call the clinic should you have any questions or concerns. The clinic phone number is [phone].  Please show the [facility] at check-in to the Emergency Department and triage nurse.

## 2019-12-20 NOTE — Progress Notes (Signed)
Pt discharged in no apparent distress. Pt left ambulatory without assistance. Pt aware of discharge instructions and verbalized understanding and had no further questions.  

## 2019-12-21 ENCOUNTER — Telehealth: Payer: Self-pay | Admitting: *Deleted

## 2019-12-21 ENCOUNTER — Other Ambulatory Visit: Payer: Self-pay | Admitting: *Deleted

## 2019-12-21 MED ORDER — FLUCONAZOLE 100 MG PO TABS: 100.0000 mg | ORAL_TABLET | Freq: Every day | ORAL | 0 refills | Status: DC

## 2019-12-21 NOTE — Telephone Encounter (Signed)
Message received from patient stating that he woke up with oral thrush and would like Nystatin and Diflucan sent in this morning to the [facility] in [facility].  Dr. Marin Olp notified.  Call placed back to patient and patient notified that Dr. Marin Olp would like for him to take Diflucan 100 mg PO daily times ten days. Pt appreciative of call back and has no further questions at this time.

## 2019-12-27 DIAGNOSIS — H903 Sensorineural hearing loss, bilateral: Secondary | ICD-10-CM | POA: Diagnosis not present

## 2020-01-02 ENCOUNTER — Other Ambulatory Visit: Payer: Self-pay | Admitting: Hematology & Oncology

## 2020-01-02 ENCOUNTER — Other Ambulatory Visit (HOSPITAL_BASED_OUTPATIENT_CLINIC_OR_DEPARTMENT_OTHER): Payer: Self-pay | Admitting: Hematology & Oncology

## 2020-01-02 ENCOUNTER — Other Ambulatory Visit: Payer: Self-pay | Admitting: Family

## 2020-01-02 DIAGNOSIS — C73 Malignant neoplasm of thyroid gland: Secondary | ICD-10-CM

## 2020-01-02 DIAGNOSIS — M130 Polyarthritis, unspecified: Secondary | ICD-10-CM

## 2020-01-04 ENCOUNTER — Inpatient Hospital Stay: Payer: 59

## 2020-01-04 ENCOUNTER — Other Ambulatory Visit: Payer: Self-pay | Admitting: *Deleted

## 2020-01-04 ENCOUNTER — Other Ambulatory Visit: Payer: Self-pay

## 2020-01-04 ENCOUNTER — Inpatient Hospital Stay (HOSPITAL_BASED_OUTPATIENT_CLINIC_OR_DEPARTMENT_OTHER): Payer: 59 | Admitting: Hematology & Oncology

## 2020-01-04 ENCOUNTER — Encounter: Payer: Self-pay | Admitting: Hematology & Oncology

## 2020-01-04 VITALS — Wt 288.0 lb

## 2020-01-04 DIAGNOSIS — C73 Malignant neoplasm of thyroid gland: Secondary | ICD-10-CM

## 2020-01-04 DIAGNOSIS — R809 Proteinuria, unspecified: Secondary | ICD-10-CM

## 2020-01-04 DIAGNOSIS — Z5111 Encounter for antineoplastic chemotherapy: Secondary | ICD-10-CM | POA: Diagnosis not present

## 2020-01-04 DIAGNOSIS — C787 Secondary malignant neoplasm of liver and intrahepatic bile duct: Secondary | ICD-10-CM | POA: Diagnosis not present

## 2020-01-04 DIAGNOSIS — Z79899 Other long term (current) drug therapy: Secondary | ICD-10-CM | POA: Diagnosis not present

## 2020-01-04 LAB — CMP (CANCER CENTER ONLY)
ALT: 28 U/L (ref 0–44)
AST: 23 U/L (ref 15–41)
Albumin: 3.7 g/dL (ref 3.5–5.0)
Alkaline Phosphatase: 80 U/L (ref 38–126)
Anion gap: 8 (ref 5–15)
BUN: 23 mg/dL — ABNORMAL HIGH (ref 6–20)
CO2: 31 mmol/L (ref 22–32)
Calcium: 9.6 mg/dL (ref 8.9–10.3)
Chloride: 99 mmol/L (ref 98–111)
Creatinine: 1.24 mg/dL (ref 0.61–1.24)
GFR, Estimated: >60 mL/min (ref >60)
Glucose, Bld: 122 mg/dL — ABNORMAL HIGH (ref 70–99)
Potassium: 3.5 mmol/L (ref 3.5–5.1)
Sodium: 138 mmol/L (ref 135–145)
Total Bilirubin: 0.5 mg/dL (ref 0.3–1.2)
Total Protein: 5.7 g/dL — ABNORMAL LOW (ref 6.5–8.1)

## 2020-01-04 LAB — CBC WITH DIFFERENTIAL (CANCER CENTER ONLY)
Abs Immature Granulocytes: 0.02 10*3/uL (ref 0.00–0.07)
Basophils Absolute: 0 10*3/uL (ref 0.0–0.1)
Basophils Relative: 0 %
Eosinophils Absolute: 0 10*3/uL (ref 0.0–0.5)
Eosinophils Relative: 1 %
HCT: 29.8 % — ABNORMAL LOW (ref 39.0–52.0)
Hemoglobin: 9.7 g/dL — ABNORMAL LOW (ref 13.0–17.0)
Immature Granulocytes: 1 %
Lymphocytes Relative: 29 %
Lymphs Abs: 0.8 10*3/uL (ref 0.7–4.0)
MCH: 34 pg (ref 26.0–34.0)
MCHC: 32.6 g/dL (ref 30.0–36.0)
MCV: 104.6 fL — ABNORMAL HIGH (ref 80.0–100.0)
Monocytes Absolute: 0.5 10*3/uL (ref 0.1–1.0)
Monocytes Relative: 19 %
Neutro Abs: 1.5 10*3/uL — ABNORMAL LOW (ref 1.7–7.7)
Neutrophils Relative %: 50 %
Platelet Count: 142 10*3/uL — ABNORMAL LOW (ref 150–400)
RBC: 2.85 MIL/uL — ABNORMAL LOW (ref 4.22–5.81)
RDW: 15.8 % — ABNORMAL HIGH (ref 11.5–15.5)
WBC Count: 2.9 10*3/uL — ABNORMAL LOW (ref 4.0–10.5)
nRBC: 0 % (ref 0.0–0.2)

## 2020-01-04 LAB — T4, FREE: Free T4: 1.47 ng/dL — ABNORMAL HIGH (ref 0.61–1.12)

## 2020-01-04 LAB — LACTATE DEHYDROGENASE: LDH: 343 U/L — ABNORMAL HIGH (ref 98–192)

## 2020-01-04 LAB — TOTAL PROTEIN, URINE DIPSTICK: Protein, ur: NEGATIVE mg/dL

## 2020-01-04 MED ORDER — PALONOSETRON HCL INJECTION 0.25 MG/5ML
0.2500 mg | Freq: Once | INTRAVENOUS | Status: AC
  Administered 2020-01-04: 0.25 mg via INTRAVENOUS

## 2020-01-04 MED ORDER — SODIUM CHLORIDE 0.9 % IV SOLN
Freq: Once | INTRAVENOUS | Status: AC
  Filled 2020-01-04: qty 250

## 2020-01-04 MED ORDER — SODIUM CHLORIDE 0.9 % IV SOLN
10.0000 mg | Freq: Once | INTRAVENOUS | Status: AC
  Administered 2020-01-04: 10 mg via INTRAVENOUS
  Filled 2020-01-04: qty 10

## 2020-01-04 MED ORDER — SODIUM CHLORIDE 0.9% FLUSH
10.0000 mL | Freq: Once | INTRAVENOUS | Status: AC
  Administered 2020-01-04: 10 mL via INTRAVENOUS
  Filled 2020-01-04: qty 10

## 2020-01-04 MED ORDER — HEPARIN SOD (PORK) LOCK FLUSH 100 UNIT/ML IV SOLN
500.0000 [IU] | Freq: Once | INTRAVENOUS | Status: AC
  Administered 2020-01-04: 500 [IU] via INTRAVENOUS
  Filled 2020-01-04: qty 5

## 2020-01-04 MED ORDER — DOXORUBICIN HCL CHEMO IV INJECTION 2 MG/ML
25.0000 mg/m2 | Freq: Once | INTRAVENOUS | Status: AC
  Administered 2020-01-04: 62 mg via INTRAVENOUS
  Filled 2020-01-04: qty 31

## 2020-01-04 MED ORDER — PALONOSETRON HCL INJECTION 0.25 MG/5ML
INTRAVENOUS | Status: AC
  Filled 2020-01-04: qty 5

## 2020-01-04 NOTE — Patient Instructions (Signed)

## 2020-01-04 NOTE — Patient Instructions (Signed)
Error

## 2020-01-04 NOTE — Patient Instructions (Signed)
Doxorubicin injection What is this medicine? DOXORUBICIN (dox oh ROO bi sin) is a chemotherapy drug. It is used to treat many kinds of cancer like leukemia, lymphoma, neuroblastoma, sarcoma, and Wilms' tumor. It is also used to treat bladder cancer, breast cancer, lung cancer, ovarian cancer, stomach cancer, and thyroid cancer. This medicine may be used for other purposes; ask your health care provider or pharmacist if you have questions. COMMON BRAND NAME(S): Adriamycin, Adriamycin PFS, Adriamycin RDF, Rubex What should I tell my health care provider before I take this medicine? They need to know if you have any of these conditions:  heart disease  history of low blood counts caused by a medicine  liver disease  recent or ongoing radiation therapy  an unusual or allergic reaction to doxorubicin, other chemotherapy agents, other medicines, foods, dyes, or preservatives  pregnant or trying to get pregnant  breast-feeding How should I use this medicine? This drug is given as an infusion into a vein. It is administered in a hospital or clinic by a specially trained health care professional. If you have pain, swelling, burning or any unusual feeling around the site of your injection, tell your health care professional right away. Talk to your pediatrician regarding the use of this medicine in children. Special care may be needed. Overdosage: If you think you have taken too much of this medicine contact a poison control center or emergency room at once. NOTE: This medicine is only for you. Do not share this medicine with others. What if I miss a dose? It is important not to miss your dose. Call your doctor or health care professional if you are unable to keep an appointment. What may interact with this medicine? This medicine may interact with the following medications:  6-mercaptopurine  paclitaxel  phenytoin  [place]. Joshual's Wort  trastuzumab  verapamil This list may not describe  all possible interactions. Give your health care provider a list of all the medicines, herbs, non-prescription drugs, or dietary supplements you use. Also tell them if you smoke, drink alcohol, or use illegal drugs. Some items may interact with your medicine. What should I watch for while using this medicine? This drug may make you feel generally unwell. This is not uncommon, as chemotherapy can affect healthy cells as well as cancer cells. Report any side effects. Continue your course of treatment even though you feel ill unless your doctor tells you to stop. There is a maximum amount of this medicine you should receive throughout your life. The amount depends on the medical condition being treated and your overall health. Your doctor will watch how much of this medicine you receive in your lifetime. Tell your doctor if you have taken this medicine before. You may need blood work done while you are taking this medicine. Your urine may turn red for a few days after your dose. This is not blood. If your urine is dark or brown, call your doctor. In some cases, you may be given additional medicines to help with side effects. Follow all directions for their use. Call your doctor or health care professional for advice if you get a fever, chills or sore throat, or other symptoms of a cold or flu. Do not treat yourself. This drug decreases your body's ability to fight infections. Try to avoid being around people who are sick. This medicine may increase your risk to bruise or bleed. Call your doctor or health care professional if you notice any unusual bleeding. Talk to your doctor  about your risk of cancer. You may be more at risk for certain types of cancers if you take this medicine. Do not become pregnant while taking this medicine or for 6 months after stopping it. Women should inform their doctor if they wish to become pregnant or think they might be pregnant. Men should not father a child while taking this  medicine and for 6 months after stopping it. There is a potential for serious side effects to an unborn child. Talk to your health care professional or pharmacist for more information. Do not breast-feed an infant while taking this medicine. This medicine has caused ovarian failure in some women and reduced sperm counts in some men This medicine may interfere with the ability to have a child. Talk with your doctor or health care professional if you are concerned about your fertility. This medicine may cause a decrease in Co-Enzyme Q-10. You should make sure that you get enough Co-Enzyme Q-10 while you are taking this medicine. Discuss the foods you eat and the vitamins you take with your health care professional. What side effects may I notice from receiving this medicine? Side effects that you should report to your doctor or health care professional as soon as possible:  allergic reactions like skin rash, itching or hives, swelling of the face, lips, or tongue  breathing problems  chest pain  fast or irregular heartbeat  low blood counts - this medicine may decrease the number of white blood cells, red blood cells and platelets. You may be at increased risk for infections and bleeding.  pain, redness, or irritation at site where injected  signs of infection - fever or chills, cough, sore throat, pain or difficulty passing urine  signs of decreased platelets or bleeding - bruising, pinpoint red spots on the skin, black, tarry stools, blood in the urine  swelling of the ankles, feet, hands  tiredness  weakness Side effects that usually do not require medical attention (report to your doctor or health care professional if they continue or are bothersome):  diarrhea  hair loss  mouth sores  nail discoloration or damage  nausea  red colored urine  vomiting This list may not describe all possible side effects. Call your doctor for medical advice about side effects. You may report  side effects to FDA at 1-800-FDA-1088. Where should I keep my medicine? This drug is given in a hospital or clinic and will not be stored at home. NOTE: This sheet is a summary. It may not cover all possible information. If you have questions about this medicine, talk to your doctor, pharmacist, or health care provider.  2020 [employer]/Gold Standard (2016-09-16 11:01:26) Dexamethasone injection What is this medicine? DEXAMETHASONE (dex a METH a sone) is a corticosteroid. It is used to treat inflammation of the skin, joints, lungs, and other organs. Common conditions treated include asthma, allergies, and arthritis. It is also used for other conditions, like blood disorders and diseases of the adrenal glands. This medicine may be used for other purposes; ask your health care provider or pharmacist if you have questions. COMMON BRAND NAME(S): Decadron, DoubleDex, Simplist Dexamethasone, Solurex What should I tell my health care provider before I take this medicine? They need to know if you have any of these conditions:  Cushing's syndrome  diabetes  glaucoma  heart disease  high blood pressure  infection like herpes, measles, tuberculosis, or chickenpox  kidney disease  liver disease  mental illness  myasthenia gravis  osteoporosis  previous heart attack  seizures  stomach or intestine problems  thyroid disease  an unusual or allergic reaction to dexamethasone, corticosteroids, other medicines, lactose, foods, dyes, or preservatives  pregnant or trying to get pregnant  breast-feeding How should I use this medicine? This medicine is for injection into a muscle, joint, lesion, soft tissue, or vein. It is given by a health care professional in a hospital or clinic setting. Talk to your pediatrician regarding the use of this medicine in children. Special care may be needed. Overdosage: If you think you have taken too much of this medicine contact a poison control center  or emergency room at once. NOTE: This medicine is only for you. Do not share this medicine with others. What if I miss a dose? This may not apply. If you are having a series of injections over a prolonged period, try not to miss an appointment. Call your doctor or health care professional to reschedule if you are unable to keep an appointment. What may interact with this medicine? Do not take this medicine with any of the following medications:  live virus vaccines This medicine may also interact with the following medications:  aminoglutethimide  amphotericin B  aspirin and aspirin-like medicines  certain antibiotics like erythromycin, clarithromycin, and troleandomycin  certain antivirals for HIV or hepatitis  certain medicines for seizures like carbamazepine, phenobarbital, phenytoin  certain medicines to treat myasthenia gravis  cholestyramine  cyclosporine  digoxin  diuretics  ephedrine  male hormones, like estrogen or progestins and birth control pills  insulin or other medicines for diabetes  isoniazid  ketoconazole  medicines that relax muscles for surgery  mifepristone  NSAIDs, medicines for pain and inflammation, like ibuprofen or naproxen  rifampin  skin tests for allergies  thalidomide  vaccines  warfarin This list may not describe all possible interactions. Give your health care provider a list of all the medicines, herbs, non-prescription drugs, or dietary supplements you use. Also tell them if you smoke, drink alcohol, or use illegal drugs. Some items may interact with your medicine. What should I watch for while using this medicine? Visit your health care professional for regular checks on your progress. Tell your health care professional if your symptoms do not start to get better or if they get worse. Your condition will be monitored carefully while you are receiving this medicine. Wear a medical ID bracelet or chain. Carry a card that  describes your disease and details of your medicine and dosage times. This medicine may increase your risk of getting an infection. Call your health care professional for advice if you get a fever, chills, or sore throat, or other symptoms of a cold or flu. Do not treat yourself. Try to avoid being around people who are sick. Call your health care professional if you are around anyone with measles, chickenpox, or if you develop sores or blisters that do not heal properly. If you are going to need surgery or other procedures, tell your doctor or health care professional that you have taken this medicine within the last 12 months. Ask your doctor or health care professional about your diet. You may need to lower the amount of salt you eat. This medicine may increase blood sugar. Ask your healthcare provider if changes in diet or medicines are needed if you have diabetes. What side effects may I notice from receiving this medicine? Side effects that you should report to your doctor or health care professional as soon as possible:  allergic reactions like skin rash, itching  or hives, swelling of the face, lips, or tongue  bloody or black, tarry stools  changes in emotions or moods  changes in vision  confusion, excitement, restlessness  depressed mood  eye pain  hallucinations  muscle weakness  severe or sudden stomach or belly pain  signs and symptoms of high blood sugar such as being more thirsty or hungry or having to urinate more than normal. You may also feel very tired or have blurry vision.  signs and symptoms of infection like fever; chills; cough; sore throat; pain or trouble passing urine  swelling of ankles, feet  unusual bruising or bleeding  wounds that do not heal Side effects that usually do not require medical attention (report to your doctor or health care professional if they continue or are bothersome):  increased appetite  increased growth of face or body  hair  headache  nausea, vomiting  pain, redness, or irritation at site where injected  skin problems, acne, thin and shiny skin  trouble sleeping  weight gain This list may not describe all possible side effects. Call your doctor for medical advice about side effects. You may report side effects to FDA at 1-800-FDA-1088. Where should I keep my medicine? This medicine is given in a hospital or clinic and will not be stored at home. NOTE: This sheet is a summary. It may not cover all possible information. If you have questions about this medicine, talk to your doctor, pharmacist, or health care provider.  2020 [employer]/Gold Standard (2018-08-16 13:51:58) Palonosetron Injection What is this medicine? PALONOSETRON (pal oh NOE se tron) is used to prevent nausea and vomiting caused by chemotherapy. It also helps prevent delayed nausea and vomiting that may occur a few days after your treatment. This medicine may be used for other purposes; ask your health care provider or pharmacist if you have questions. COMMON BRAND NAME(S): Aloxi What should I tell my health care provider before I take this medicine? They need to know if you have any of these conditions:  an unusual or allergic reaction to palonosetron, dolasetron, granisetron, ondansetron, other medicines, foods, dyes, or preservatives  pregnant or trying to get pregnant  breast-feeding How should I use this medicine? This medicine is for infusion into a vein. It is given by a health care professional in a hospital or clinic setting. Talk to your pediatrician regarding the use of this medicine in children. While this drug may be prescribed for children as young as 1 month for selected conditions, precautions do apply. Overdosage: If you think you have taken too much of this medicine contact a poison control center or emergency room at once. NOTE: This medicine is only for you. Do not share this medicine with others. What if I miss  a dose? This does not apply. What may interact with this medicine?  certain medicines for depression, anxiety, or psychotic disturbances  fentanyl  linezolid  MAOIs like Carbex, Eldepryl, Marplan, Nardil, and Parnate  methylene blue (injected into a vein)  tramadol This list may not describe all possible interactions. Give your health care provider a list of all the medicines, herbs, non-prescription drugs, or dietary supplements you use. Also tell them if you smoke, drink alcohol, or use illegal drugs. Some items may interact with your medicine. What should I watch for while using this medicine? Your condition will be monitored carefully while you are receiving this medicine. What side effects may I notice from receiving this medicine? Side effects that you should report to your  doctor or health care professional as soon as possible:  allergic reactions like skin rash, itching or hives, swelling of the face, lips, or tongue  breathing problems  confusion  dizziness  fast, irregular heartbeat  fever and chills  loss of balance or coordination  seizures  sweating  swelling of the hands and feet  tremors  unusually weak or tired Side effects that usually do not require medical attention (report to your doctor or health care professional if they continue or are bothersome):  constipation or diarrhea  headache This list may not describe all possible side effects. Call your doctor for medical advice about side effects. You may report side effects to FDA at 1-800-FDA-1088. Where should I keep my medicine? This drug is given in a hospital or clinic and will not be stored at home. NOTE: This sheet is a summary. It may not cover all possible information. If you have questions about this medicine, talk to your doctor, pharmacist, or health care provider.  2020 [employer]/Gold Standard (2012-12-09 10:38:36)

## 2020-01-04 NOTE — Progress Notes (Signed)
Hematology and Oncology Follow Up Visit  Brian [name] [id number] 07-Aug-1975 44 y.o. 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  Overall, he seems to be managing pretty well.  He is back to work.  He is working 6-hour days.  Try to work himself up to 8-hour days and then may be a full shift.  Hopefully this will not be too much for him.  He does have little bit of swelling in the legs.  He will take more of the Lasix.  He has pain pretty well managed.  I am happy that he is a little more comfortable right now.  He has not had any issues with bleeding.  There is no cough.  His appetite is doing relatively well.  He has had some nausea but no vomiting.  There is been no diarrhea.  Overall, I would have to say that his performance status is probably ECOG 1.    Medications:  Allergies as of 01/04/2020      Reactions   Dextromethorphan-guaifenesin Other (See Comments)   Irregular heartbeat   Doxycycline Anaphylaxis, Nausea And Vomiting, Rash, Other (See Comments)   "heart arrythmia" and "dyspepsia" (only oral doxycycline causes reaction)   Gadobutrol Hives, Other (See Comments)   Patient had MRI scan at [place]. Patient called one hour after he left imaging facility to report two "blisters" that came up on "back" of lip.    Gadolinium Derivatives Hives   Patient had MRI scan at [place]. Patient called one hour after he left imaging facility to report two "blisters" that came up on "back" of lip.    Guaifenesin Palpitations       Ibuprofen Hives, Itching   Lisinopril Other (See Comments)   Angioedema   Pantoprazole Itching   Tramadol Hives, Itching   Barium Rash   Developed redness around neck after drinking 1st bottle of Barocat; pt was given Benedryl by ED Mds to be "on the safe side" before drinking 2nd bottle      Medication List       Accurate as of January 04, 2020  1:53 PM. If you have any questions, ask your nurse or doctor.        baclofen 10 MG tablet Commonly known as: LIORESAL Take 1 tablet (10 mg total) by mouth 3 (three) times daily.   cloNIDine 0.1 MG tablet Commonly known as: CATAPRES Take 0.1 mg by mouth 2 (two) times daily.   cloNIDine 0.2 MG tablet Commonly known as: CATAPRES Take 0.2 mg by mouth 3 times/day as needed-between meals & bedtime.   cyclobenzaprine 10 MG tablet Commonly known as: FLEXERIL Take 1 tablet (10 mg total) by mouth 3 (three) times daily as needed for muscle spasms.   dexamethasone 4 MG tablet Commonly known as: DECADRON TAKE 1 TABLET (4 MG TOTAL) BY MOUTH 2 (TWO) TIMES DAILY WITH A MEAL. TAKE 2 TABS TWICE A DAY FOR 4 DAYS -- START DAY BEFORE EACH CHEMOTHERAPY  dexamethasone 4 MG tablet Commonly known as: DECADRON Take 2 tablets (8 mg total) by mouth daily. Start the day after chemotherapy for 2 days.   EPINEPHrine 0.3 mg/0.3 mL Soaj injection Commonly known as: EpiPen 2-Pak USE AS DIRECTED FOR LIFE THREATENING ALLERGIC REACTIONS   fluconazole 100 MG tablet Commonly known as: DIFLUCAN Take 1 tablet (100 mg total) by mouth daily.   furosemide 80 MG tablet Commonly known as: LASIX TAKE 1 TABLET (80 MG TOTAL) BY MOUTH DAILY.   gabapentin 300 MG capsule Commonly known as: NEURONTIN TAKE 1 CAPSULE (300 MG TOTAL) BY MOUTH 3 (THREE) TIMES DAILY.   isosorbide mononitrate 60 MG 24 hr tablet Commonly known as: IMDUR Take 1 tablet (60 mg total) by mouth daily.   Janssen COVID-19 Vaccine 0.5 ML injection Generic drug: COVID-19 Ad26 vaccine  (JANSSEN/J&J)   levofloxacin 500 MG tablet Commonly known as: Levaquin Take 1 tablet (500 mg total) by mouth daily. Take daily for 14 days -- start day AFTER each cehmotherapy   levothyroxine 200 MCG tablet Commonly known as: SYNTHROID Take by mouth daily.   levothyroxine 88 MCG tablet Commonly known as: SYNTHROID Take by mouth.   lidocaine-prilocaine cream Commonly known as: EMLA Apply to affected area once   LORazepam 0.5 MG tablet Commonly known as: ATIVAN TAKE 1 TABLET (0.5 MG TOTAL) BY MOUTH EVERY 6 (SIX) HOURS AS NEEDED (NAUSEA OR VOMITING).   losartan 25 MG tablet Commonly known as: COZAAR Take 1 tablet (25 mg total) by mouth 2 (two) times daily.   magic mouthwash Soln Take 10 mLs by mouth 4 (four) times daily as needed for mouth pain.   methylphenidate 10 MG tablet Commonly known as: RITALIN Take 1 tablet (10 mg total) by mouth 2 (two) times daily.   metoCLOPramide 10 MG tablet Commonly known as: REGLAN TAKE 1 TABLET BY MOUTH FOUR TIMES DAILY BEFORE MEALS AND AT BEDTIME   metolazone 5 MG tablet Commonly known as: ZAROXOLYN TAKE 1 TABLET (5 MG TOTAL) BY MOUTH DAILY. TAKE 1 HOUR BEFORE LASIX.   multivitamin capsule Take 1 capsule by mouth daily.   OLANZapine 10 MG tablet Commonly known as: ZYPREXA Take 1 tablet (10 mg total) by mouth at bedtime.   ondansetron 8 MG tablet Commonly known as: ZOFRAN Take 8 mg by mouth every 8 (eight) hours as needed for nausea or vomiting.   ondansetron 8 MG tablet Commonly known as: Zofran Take 1 tablet (8 mg total) by mouth 2 (two) times daily as needed for refractory nausea / vomiting. Start on day 3 after chemo.   oxyCODONE 20 mg 12 hr tablet Commonly known as: OXYCONTIN Take 1 tablet (20 mg total) by mouth every 12 (twelve) hours.   Oxycodone HCl 10 MG Tabs TAKE 1 TABLET (10 MG TOTAL) BY MOUTH EVERY 6 (SIX) HOURS AS NEEDED.   potassium chloride 10 MEQ tablet Commonly known as: KLOR-CON TAKE 1 TABLET (10 MEQ  TOTAL) BY MOUTH DAILY.   predniSONE 50 MG tablet Commonly known as: DELTASONE 13 HOUR PREP - TAKE 1 TABLET BY MOUTH 13 HOURS,7 HOURS,AND 1 HOUR PRIOR TO SCAN;THE SCAN SHOULD BE PERFORMED WITHIN 6 HOURS OF LAST DOSE   prochlorperazine 10 MG tablet Commonly known as: COMPAZINE Take 1 tablet (10 mg total) by mouth every 6 (six) hours as needed (Nausea or vomiting).   promethazine 12.5 MG tablet Commonly known as: PHENERGAN Take 1 tablet (12.5 mg total) by mouth at bedtime.   promethazine 25 MG tablet Commonly known as: PHENERGAN  TAKE 1 TABLET BY MOUTH EVERY 6 HOURS AS NEEDED NAUSEA AND/OR VOMITING   sucralfate 1 GM/10ML suspension Commonly known as: Carafate Take 10 mLs (1 g total) by mouth 4 (four) times daily -  with meals and at bedtime.   TURMERIC CURCUMIN PO Take 1 capsule by mouth daily.       Allergies:  Allergies  Allergen Reactions  . Dextromethorphan-Guaifenesin Other (See Comments)    Irregular heartbeat   . Doxycycline Anaphylaxis, Nausea And Vomiting, Rash and Other (See Comments)    "heart arrythmia" and "dyspepsia" (only oral doxycycline causes reaction)  . Gadobutrol Hives and Other (See Comments)    Patient had MRI scan at [place]. Patient called one hour after he left imaging facility to report two "blisters" that came up on "back" of lip.    . Gadolinium Derivatives Hives    Patient had MRI scan at [place]. Patient called one hour after he left imaging facility to report two "blisters" that came up on "back" of lip.   . Guaifenesin Palpitations       . Ibuprofen Hives and Itching  . Lisinopril Other (See Comments)    Angioedema  . Pantoprazole Itching  . Tramadol Hives and Itching  . Barium Rash    Developed redness around neck after drinking 1st bottle of Barocat; pt was given Benedryl by ED Mds to be "on the safe side" before drinking 2nd bottle    Past Medical History, Surgical history, Social history, and Family History  were reviewed and updated.  Review of Systems: Review of Systems  Constitutional: Negative.   HENT: Negative.   Eyes: Negative.   Respiratory: Negative.   Cardiovascular: Positive for leg swelling.  Gastrointestinal: Positive for abdominal pain.  Genitourinary: Negative.   Musculoskeletal: Negative.   Skin: Negative.   Neurological: Negative.   Endo/Heme/Allergies: Negative.   Psychiatric/Behavioral: Negative.       Physical Exam:  weight is 288 lb (130.6 kg).   Wt Readings from Last 3 Encounters:  01/04/20 288 lb (130.6 kg)  12/15/19 275 lb (124.7 kg)  11/14/19 268 lb 0.6 oz (121.6 kg)    Physical Exam Vitals reviewed.  HENT:     Head: Normocephalic and atraumatic.  Eyes:     Pupils: Pupils are equal, round, and reactive to light.  Cardiovascular:     Rate and Rhythm: Normal rate and regular rhythm.     Heart sounds: Normal heart sounds.  Pulmonary:     Effort: Pulmonary effort is normal.     Breath sounds: Normal breath sounds.  Abdominal:     General: Bowel sounds are normal.     Palpations: Abdomen is soft.  Musculoskeletal:        General: Swelling present. No tenderness or deformity. Normal range of motion.     Cervical back: Normal range of motion.  Lymphadenopathy:     Cervical: No cervical adenopathy.  Skin:    General: Skin is warm and dry.     Findings: No erythema or rash.  Neurological:     Mental Status: He is alert and oriented to person, place, and time.  Psychiatric:        Behavior: Behavior normal.        Thought Content: Thought content normal.        Judgment: Judgment normal.      Lab Results  Component Value Date   WBC 2.9 (L) 01/04/2020   HGB 9.7 (L) 01/04/2020   HCT 29.8 (L) 01/04/2020  MCV 104.6 (H) 01/04/2020   PLT 142 (L) 01/04/2020   No results found for: FERRITIN, IRON, TIBC, UIBC, IRONPCTSAT Lab Results  Component Value Date   RBC 2.85 (L) 01/04/2020   No results found for: KPAFRELGTCHN, LAMBDASER,  KAPLAMBRATIO No results found for: IGGSERUM, IGA, IGMSERUM No results found for: Odetta Pink, SPEI   Chemistry      Component Value Date/Time   NA 138 12/20/2019 1030   NA 141 02/06/2019 1210   K 4.0 12/20/2019 1030   CL 104 12/20/2019 1030   CO2 27 12/20/2019 1030   BUN 24 (H) 12/20/2019 1030   BUN 8 02/06/2019 1210   CREATININE 0.81 12/20/2019 1030   CREATININE 1.00 12/05/2019 0823      Component Value Date/Time   CALCIUM 9.2 12/20/2019 1030   ALKPHOS 58 12/20/2019 1030   AST 13 (L) 12/20/2019 1030   AST 16 12/05/2019 0823   ALT 18 12/20/2019 1030   ALT 22 12/05/2019 0823   BILITOT 0.4 12/20/2019 1030   BILITOT 0.4 12/05/2019 0823       Impression and Plan: Brian [name] is a very pleasant 44 yo caucasian gentleman with an 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   He is doing well with the Adriamycin.  Hopefully we will see a response.  I realize that this kind of tumor can have a very difficult response to chemotherapy.  This would be the start of his second cycle of treatment.  After this second cycle of treatment, we will then move ahead with our MRI and see how everything looks.  I think we also do a CT scan of the chest to make sure nothing is going on with the lungs.  I know that he is trying really hard.  He is doing everything that we have asked him to do.  He certainly has a strong constitution.  I will plan to get him back to see [place] in 1 month.  [name], MD 11/18/20211:53 PM

## 2020-01-04 NOTE — Progress Notes (Signed)
Pt discharged in no apparent distress. Pt left ambulatory without assistance. Pt aware of discharge instructions and verbalized understanding and had no further questions.  

## 2020-01-04 NOTE — Progress Notes (Signed)
Okay to treat with HR 100 per Dr. Marin Olp. Elevated due to pain.

## 2020-01-05 ENCOUNTER — Telehealth: Payer: Self-pay

## 2020-01-05 LAB — T3 UPTAKE: T3 Uptake Ratio: 32 % (ref 24–39)

## 2020-01-05 LAB — TSH: TSH: 3.1 u[IU]/mL (ref 0.320–4.118)

## 2020-01-05 NOTE — Telephone Encounter (Signed)
[name] and he is aware of his appts per 01/05/20 and also aware that we will call with scan appts///// AOM

## 2020-01-09 ENCOUNTER — Other Ambulatory Visit: Payer: Self-pay | Admitting: *Deleted

## 2020-01-09 ENCOUNTER — Inpatient Hospital Stay: Payer: 59

## 2020-01-09 ENCOUNTER — Other Ambulatory Visit (HOSPITAL_BASED_OUTPATIENT_CLINIC_OR_DEPARTMENT_OTHER): Payer: Self-pay | Admitting: Hematology & Oncology

## 2020-01-09 ENCOUNTER — Other Ambulatory Visit: Payer: Self-pay

## 2020-01-09 DIAGNOSIS — C787 Secondary malignant neoplasm of liver and intrahepatic bile duct: Secondary | ICD-10-CM | POA: Diagnosis not present

## 2020-01-09 DIAGNOSIS — Z79899 Other long term (current) drug therapy: Secondary | ICD-10-CM | POA: Diagnosis not present

## 2020-01-09 DIAGNOSIS — E876 Hypokalemia: Secondary | ICD-10-CM

## 2020-01-09 DIAGNOSIS — C73 Malignant neoplasm of thyroid gland: Secondary | ICD-10-CM | POA: Diagnosis not present

## 2020-01-09 DIAGNOSIS — Z5111 Encounter for antineoplastic chemotherapy: Secondary | ICD-10-CM | POA: Diagnosis not present

## 2020-01-09 LAB — COMPREHENSIVE METABOLIC PANEL
ALT: 26 U/L (ref 0–44)
AST: 25 U/L (ref 15–41)
Albumin: 4.1 g/dL (ref 3.5–5.0)
Alkaline Phosphatase: 83 U/L (ref 38–126)
Anion gap: 10 (ref 5–15)
BUN: 24 mg/dL — ABNORMAL HIGH (ref 6–20)
CO2: 35 mmol/L — ABNORMAL HIGH (ref 22–32)
Calcium: 9.3 mg/dL (ref 8.9–10.3)
Chloride: 94 mmol/L — ABNORMAL LOW (ref 98–111)
Creatinine, Ser: 1.38 mg/dL — ABNORMAL HIGH (ref 0.61–1.24)
GFR, Estimated: >60 mL/min (ref >60)
Glucose, Bld: 115 mg/dL — ABNORMAL HIGH (ref 70–99)
Potassium: 2.9 mmol/L — ABNORMAL LOW (ref 3.5–5.1)
Sodium: 139 mmol/L (ref 135–145)
Total Bilirubin: 0.6 mg/dL (ref 0.3–1.2)
Total Protein: 6.5 g/dL (ref 6.5–8.1)

## 2020-01-09 LAB — CBC WITH DIFFERENTIAL/PLATELET
Abs Immature Granulocytes: 0.03 10*3/uL (ref 0.00–0.07)
Basophils Absolute: 0 10*3/uL (ref 0.0–0.1)
Basophils Relative: 1 %
Eosinophils Absolute: 0 10*3/uL (ref 0.0–0.5)
Eosinophils Relative: 1 %
HCT: 32.1 % — ABNORMAL LOW (ref 39.0–52.0)
Hemoglobin: 10.4 g/dL — ABNORMAL LOW (ref 13.0–17.0)
Immature Granulocytes: 1 %
Lymphocytes Relative: 23 %
Lymphs Abs: 1 10*3/uL (ref 0.7–4.0)
MCH: 33.3 pg (ref 26.0–34.0)
MCHC: 32.4 g/dL (ref 30.0–36.0)
MCV: 102.9 fL — ABNORMAL HIGH (ref 80.0–100.0)
Monocytes Absolute: 0.3 10*3/uL (ref 0.1–1.0)
Monocytes Relative: 6 %
Neutro Abs: 2.9 10*3/uL (ref 1.7–7.7)
Neutrophils Relative %: 68 %
Platelets: 206 10*3/uL (ref 150–400)
RBC: 3.12 MIL/uL — ABNORMAL LOW (ref 4.22–5.81)
RDW: 15.6 % — ABNORMAL HIGH (ref 11.5–15.5)
WBC: 4.2 10*3/uL (ref 4.0–10.5)
nRBC: 0 % (ref 0.0–0.2)

## 2020-01-09 MED ORDER — POTASSIUM CHLORIDE CRYS ER 20 MEQ PO TBCR
40.0000 meq | EXTENDED_RELEASE_TABLET | Freq: Two times a day (BID) | ORAL | Status: AC
  Administered 2020-01-09 (×2): 40 meq via ORAL

## 2020-01-09 MED ORDER — LORAZEPAM 0.5 MG PO TABS: 0.5000 mg | ORAL_TABLET | Freq: Four times a day (QID) | ORAL | 0 refills | Status: DC | PRN

## 2020-01-09 MED ORDER — SODIUM CHLORIDE 0.9% FLUSH
10.0000 mL | INTRAVENOUS | Status: DC | PRN
  Administered 2020-01-09: 10 mL
  Filled 2020-01-09: qty 10

## 2020-01-09 MED ORDER — HEPARIN SOD (PORK) LOCK FLUSH 100 UNIT/ML IV SOLN
500.0000 [IU] | Freq: Once | INTRAVENOUS | Status: AC | PRN
  Administered 2020-01-09: 500 [IU]
  Filled 2020-01-09: qty 5

## 2020-01-09 MED ORDER — PALONOSETRON HCL INJECTION 0.25 MG/5ML
0.2500 mg | Freq: Once | INTRAVENOUS | Status: AC
  Administered 2020-01-09: 0.25 mg via INTRAVENOUS

## 2020-01-09 MED ORDER — SODIUM CHLORIDE 0.9 % IV SOLN
10.0000 mg | Freq: Once | INTRAVENOUS | Status: AC
  Administered 2020-01-09: 10 mg via INTRAVENOUS
  Filled 2020-01-09: qty 10

## 2020-01-09 MED ORDER — DOXORUBICIN HCL CHEMO IV INJECTION 2 MG/ML
25.0000 mg/m2 | Freq: Once | INTRAVENOUS | Status: AC
  Administered 2020-01-09: 62 mg via INTRAVENOUS
  Filled 2020-01-09: qty 31

## 2020-01-09 MED ORDER — SODIUM CHLORIDE 0.9 % IV SOLN
Freq: Once | INTRAVENOUS | Status: AC
  Filled 2020-01-09: qty 250

## 2020-01-09 NOTE — Progress Notes (Signed)
Reviewed labs with Dr Marin Olp.  Ok to treat today.  K 2.9.  Patient to get 12 K now and at end of treatment.  HR 107.  Ok to proceed with treatment

## 2020-01-09 NOTE — Patient Instructions (Signed)
Doxorubicin injection What is this medicine? DOXORUBICIN (dox oh ROO bi sin) is a chemotherapy drug. It is used to treat many kinds of cancer like leukemia, lymphoma, neuroblastoma, sarcoma, and Wilms' tumor. It is also used to treat bladder cancer, breast cancer, lung cancer, ovarian cancer, stomach cancer, and thyroid cancer. This medicine may be used for other purposes; ask your health care provider or pharmacist if you have questions. COMMON BRAND NAME(S): Adriamycin, Adriamycin PFS, Adriamycin RDF, Rubex What should I tell my health care provider before I take this medicine? They need to know if you have any of these conditions:  heart disease  history of low blood counts caused by a medicine  liver disease  recent or ongoing radiation therapy  an unusual or allergic reaction to doxorubicin, other chemotherapy agents, other medicines, foods, dyes, or preservatives  pregnant or trying to get pregnant  breast-feeding How should I use this medicine? This drug is given as an infusion into a vein. It is administered in a hospital or clinic by a specially trained health care professional. If you have pain, swelling, burning or any unusual feeling around the site of your injection, tell your health care professional right away. Talk to your pediatrician regarding the use of this medicine in children. Special care may be needed. Overdosage: If you think you have taken too much of this medicine contact a poison control center or emergency room at once. NOTE: This medicine is only for you. Do not share this medicine with others. What if I miss a dose? It is important not to miss your dose. Call your doctor or health care professional if you are unable to keep an appointment. What may interact with this medicine? This medicine may interact with the following medications:  6-mercaptopurine  paclitaxel  phenytoin  St. Lion's Wort  trastuzumab  verapamil This list may not describe  all possible interactions. Give your health care provider a list of all the medicines, herbs, non-prescription drugs, or dietary supplements you use. Also tell them if you smoke, drink alcohol, or use illegal drugs. Some items may interact with your medicine. What should I watch for while using this medicine? This drug may make you feel generally unwell. This is not uncommon, as chemotherapy can affect healthy cells as well as cancer cells. Report any side effects. Continue your course of treatment even though you feel ill unless your doctor tells you to stop. There is a maximum amount of this medicine you should receive throughout your life. The amount depends on the medical condition being treated and your overall health. Your doctor will watch how much of this medicine you receive in your lifetime. Tell your doctor if you have taken this medicine before. You may need blood work done while you are taking this medicine. Your urine may turn red for a few days after your dose. This is not blood. If your urine is dark or brown, call your doctor. In some cases, you may be given additional medicines to help with side effects. Follow all directions for their use. Call your doctor or health care professional for advice if you get a fever, chills or sore throat, or other symptoms of a cold or flu. Do not treat yourself. This drug decreases your body's ability to fight infections. Try to avoid being around people who are sick. This medicine may increase your risk to bruise or bleed. Call your doctor or health care professional if you notice any unusual bleeding. Talk to your doctor  about your risk of cancer. You may be more at risk for certain types of cancers if you take this medicine. Do not become pregnant while taking this medicine or for 6 months after stopping it. Women should inform their doctor if they wish to become pregnant or think they might be pregnant. Men should not father a child while taking this  medicine and for 6 months after stopping it. There is a potential for serious side effects to an unborn child. Talk to your health care professional or pharmacist for more information. Do not breast-feed an infant while taking this medicine. This medicine has caused ovarian failure in some women and reduced sperm counts in some men This medicine may interfere with the ability to have a child. Talk with your doctor or health care professional if you are concerned about your fertility. This medicine may cause a decrease in Co-Enzyme Q-10. You should make sure that you get enough Co-Enzyme Q-10 while you are taking this medicine. Discuss the foods you eat and the vitamins you take with your health care professional. What side effects may I notice from receiving this medicine? Side effects that you should report to your doctor or health care professional as soon as possible:  allergic reactions like skin rash, itching or hives, swelling of the face, lips, or tongue  breathing problems  chest pain  fast or irregular heartbeat  low blood counts - this medicine may decrease the number of white blood cells, red blood cells and platelets. You may be at increased risk for infections and bleeding.  pain, redness, or irritation at site where injected  signs of infection - fever or chills, cough, sore throat, pain or difficulty passing urine  signs of decreased platelets or bleeding - bruising, pinpoint red spots on the skin, black, tarry stools, blood in the urine  swelling of the ankles, feet, hands  tiredness  weakness Side effects that usually do not require medical attention (report to your doctor or health care professional if they continue or are bothersome):  diarrhea  hair loss  mouth sores  nail discoloration or damage  nausea  red colored urine  vomiting This list may not describe all possible side effects. Call your doctor for medical advice about side effects. You may report  side effects to FDA at 1-800-FDA-1088. Where should I keep my medicine? This drug is given in a hospital or clinic and will not be stored at home. NOTE: This sheet is a summary. It may not cover all possible information. If you have questions about this medicine, talk to your doctor, pharmacist, or health care provider.  2020 [employer]/Gold Standard (2016-09-16 11:01:26) Dexamethasone injection What is this medicine? DEXAMETHASONE (dex a METH a sone) is a corticosteroid. It is used to treat inflammation of the skin, joints, lungs, and other organs. Common conditions treated include asthma, allergies, and arthritis. It is also used for other conditions, like blood disorders and diseases of the adrenal glands. This medicine may be used for other purposes; ask your health care provider or pharmacist if you have questions. COMMON BRAND NAME(S): Decadron, DoubleDex, Simplist Dexamethasone, Solurex What should I tell my health care provider before I take this medicine? They need to know if you have any of these conditions:  Cushing's syndrome  diabetes  glaucoma  heart disease  high blood pressure  infection like herpes, measles, tuberculosis, or chickenpox  kidney disease  liver disease  mental illness  myasthenia gravis  osteoporosis  previous heart attack  seizures  stomach or intestine problems  thyroid disease  an unusual or allergic reaction to dexamethasone, corticosteroids, other medicines, lactose, foods, dyes, or preservatives  pregnant or trying to get pregnant  breast-feeding How should I use this medicine? This medicine is for injection into a muscle, joint, lesion, soft tissue, or vein. It is given by a health care professional in a hospital or clinic setting. Talk to your pediatrician regarding the use of this medicine in children. Special care may be needed. Overdosage: If you think you have taken too much of this medicine contact a poison control center  or emergency room at once. NOTE: This medicine is only for you. Do not share this medicine with others. What if I miss a dose? This may not apply. If you are having a series of injections over a prolonged period, try not to miss an appointment. Call your doctor or health care professional to reschedule if you are unable to keep an appointment. What may interact with this medicine? Do not take this medicine with any of the following medications:  live virus vaccines This medicine may also interact with the following medications:  aminoglutethimide  amphotericin B  aspirin and aspirin-like medicines  certain antibiotics like erythromycin, clarithromycin, and troleandomycin  certain antivirals for HIV or hepatitis  certain medicines for seizures like carbamazepine, phenobarbital, phenytoin  certain medicines to treat myasthenia gravis  cholestyramine  cyclosporine  digoxin  diuretics  ephedrine  male hormones, like estrogen or progestins and birth control pills  insulin or other medicines for diabetes  isoniazid  ketoconazole  medicines that relax muscles for surgery  mifepristone  NSAIDs, medicines for pain and inflammation, like ibuprofen or naproxen  rifampin  skin tests for allergies  thalidomide  vaccines  warfarin This list may not describe all possible interactions. Give your health care provider a list of all the medicines, herbs, non-prescription drugs, or dietary supplements you use. Also tell them if you smoke, drink alcohol, or use illegal drugs. Some items may interact with your medicine. What should I watch for while using this medicine? Visit your health care professional for regular checks on your progress. Tell your health care professional if your symptoms do not start to get better or if they get worse. Your condition will be monitored carefully while you are receiving this medicine. Wear a medical ID bracelet or chain. Carry a card that  describes your disease and details of your medicine and dosage times. This medicine may increase your risk of getting an infection. Call your health care professional for advice if you get a fever, chills, or sore throat, or other symptoms of a cold or flu. Do not treat yourself. Try to avoid being around people who are sick. Call your health care professional if you are around anyone with measles, chickenpox, or if you develop sores or blisters that do not heal properly. If you are going to need surgery or other procedures, tell your doctor or health care professional that you have taken this medicine within the last 12 months. Ask your doctor or health care professional about your diet. You may need to lower the amount of salt you eat. This medicine may increase blood sugar. Ask your healthcare provider if changes in diet or medicines are needed if you have diabetes. What side effects may I notice from receiving this medicine? Side effects that you should report to your doctor or health care professional as soon as possible:  allergic reactions like skin rash, itching  or hives, swelling of the face, lips, or tongue  bloody or black, tarry stools  changes in emotions or moods  changes in vision  confusion, excitement, restlessness  depressed mood  eye pain  hallucinations  muscle weakness  severe or sudden stomach or belly pain  signs and symptoms of high blood sugar such as being more thirsty or hungry or having to urinate more than normal. You may also feel very tired or have blurry vision.  signs and symptoms of infection like fever; chills; cough; sore throat; pain or trouble passing urine  swelling of ankles, feet  unusual bruising or bleeding  wounds that do not heal Side effects that usually do not require medical attention (report to your doctor or health care professional if they continue or are bothersome):  increased appetite  increased growth of face or body  hair  headache  nausea, vomiting  pain, redness, or irritation at site where injected  skin problems, acne, thin and shiny skin  trouble sleeping  weight gain This list may not describe all possible side effects. Call your doctor for medical advice about side effects. You may report side effects to FDA at 1-800-FDA-1088. Where should I keep my medicine? This medicine is given in a hospital or clinic and will not be stored at home. NOTE: This sheet is a summary. It may not cover all possible information. If you have questions about this medicine, talk to your doctor, pharmacist, or health care provider.  2020 [employer]/Gold Standard (2018-08-16 13:51:58) Palonosetron Injection What is this medicine? PALONOSETRON (pal oh NOE se tron) is used to prevent nausea and vomiting caused by chemotherapy. It also helps prevent delayed nausea and vomiting that may occur a few days after your treatment. This medicine may be used for other purposes; ask your health care provider or pharmacist if you have questions. COMMON BRAND NAME(S): Aloxi What should I tell my health care provider before I take this medicine? They need to know if you have any of these conditions:  an unusual or allergic reaction to palonosetron, dolasetron, granisetron, ondansetron, other medicines, foods, dyes, or preservatives  pregnant or trying to get pregnant  breast-feeding How should I use this medicine? This medicine is for infusion into a vein. It is given by a health care professional in a hospital or clinic setting. Talk to your pediatrician regarding the use of this medicine in children. While this drug may be prescribed for children as young as 1 month for selected conditions, precautions do apply. Overdosage: If you think you have taken too much of this medicine contact a poison control center or emergency room at once. NOTE: This medicine is only for you. Do not share this medicine with others. What if I miss  a dose? This does not apply. What may interact with this medicine?  certain medicines for depression, anxiety, or psychotic disturbances  fentanyl  linezolid  MAOIs like Carbex, Eldepryl, Marplan, Nardil, and Parnate  methylene blue (injected into a vein)  tramadol This list may not describe all possible interactions. Give your health care provider a list of all the medicines, herbs, non-prescription drugs, or dietary supplements you use. Also tell them if you smoke, drink alcohol, or use illegal drugs. Some items may interact with your medicine. What should I watch for while using this medicine? Your condition will be monitored carefully while you are receiving this medicine. What side effects may I notice from receiving this medicine? Side effects that you should report to your  doctor or health care professional as soon as possible:  allergic reactions like skin rash, itching or hives, swelling of the face, lips, or tongue  breathing problems  confusion  dizziness  fast, irregular heartbeat  fever and chills  loss of balance or coordination  seizures  sweating  swelling of the hands and feet  tremors  unusually weak or tired Side effects that usually do not require medical attention (report to your doctor or health care professional if they continue or are bothersome):  constipation or diarrhea  headache This list may not describe all possible side effects. Call your doctor for medical advice about side effects. You may report side effects to FDA at 1-800-FDA-1088. Where should I keep my medicine? This drug is given in a hospital or clinic and will not be stored at home. NOTE: This sheet is a summary. It may not cover all possible information. If you have questions about this medicine, talk to your doctor, pharmacist, or health care provider.  2020 [employer]/Gold Standard (2012-12-09 10:38:36)

## 2020-01-09 NOTE — Patient Instructions (Signed)

## 2020-01-10 ENCOUNTER — Inpatient Hospital Stay: Payer: 59

## 2020-01-12 ENCOUNTER — Other Ambulatory Visit: Payer: Self-pay | Admitting: Hematology & Oncology

## 2020-01-12 DIAGNOSIS — C73 Malignant neoplasm of thyroid gland: Secondary | ICD-10-CM

## 2020-01-13 ENCOUNTER — Emergency Department (HOSPITAL_BASED_OUTPATIENT_CLINIC_OR_DEPARTMENT_OTHER): Payer: 59

## 2020-01-13 ENCOUNTER — Inpatient Hospital Stay (HOSPITAL_BASED_OUTPATIENT_CLINIC_OR_DEPARTMENT_OTHER)
Admission: EM | Admit: 2020-01-13 | Discharge: 2020-01-15 | DRG: 176 | Disposition: A | Discharge status: Home / Self Care | Payer: 59 | Attending: Internal Medicine | Admitting: Internal Medicine

## 2020-01-13 ENCOUNTER — Encounter (HOSPITAL_BASED_OUTPATIENT_CLINIC_OR_DEPARTMENT_OTHER): Payer: Self-pay

## 2020-01-13 ENCOUNTER — Other Ambulatory Visit: Payer: Self-pay

## 2020-01-13 ENCOUNTER — Other Ambulatory Visit (HOSPITAL_BASED_OUTPATIENT_CLINIC_OR_DEPARTMENT_OTHER): Payer: Self-pay | Admitting: Hematology & Oncology

## 2020-01-13 DIAGNOSIS — I10 Essential (primary) hypertension: Secondary | ICD-10-CM | POA: Diagnosis not present

## 2020-01-13 DIAGNOSIS — E890 Postprocedural hypothyroidism: Secondary | ICD-10-CM | POA: Diagnosis present

## 2020-01-13 DIAGNOSIS — C787 Secondary malignant neoplasm of liver and intrahepatic bile duct: Secondary | ICD-10-CM | POA: Diagnosis not present

## 2020-01-13 DIAGNOSIS — I82432 Acute embolism and thrombosis of left popliteal vein: Secondary | ICD-10-CM | POA: Diagnosis present

## 2020-01-13 DIAGNOSIS — T451X5A Adverse effect of antineoplastic and immunosuppressive drugs, initial encounter: Secondary | ICD-10-CM | POA: Diagnosis present

## 2020-01-13 DIAGNOSIS — T7840XA Allergy, unspecified, initial encounter: Secondary | ICD-10-CM | POA: Diagnosis not present

## 2020-01-13 DIAGNOSIS — R110 Nausea: Secondary | ICD-10-CM | POA: Diagnosis present

## 2020-01-13 DIAGNOSIS — Z83438 Family history of other disorder of lipoprotein metabolism and other lipidemia: Secondary | ICD-10-CM | POA: Diagnosis not present

## 2020-01-13 DIAGNOSIS — J9811 Atelectasis: Secondary | ICD-10-CM | POA: Diagnosis not present

## 2020-01-13 DIAGNOSIS — R000 Tachycardia, unspecified: Secondary | ICD-10-CM | POA: Diagnosis not present

## 2020-01-13 DIAGNOSIS — Z79890 Hormone replacement therapy (postmenopausal): Secondary | ICD-10-CM | POA: Diagnosis not present

## 2020-01-13 DIAGNOSIS — Z85850 Personal history of malignant neoplasm of thyroid: Secondary | ICD-10-CM

## 2020-01-13 DIAGNOSIS — K219 Gastro-esophageal reflux disease without esophagitis: Secondary | ICD-10-CM | POA: Diagnosis present

## 2020-01-13 DIAGNOSIS — Z6841 Body Mass Index (BMI) 40.0 and over, adult: Secondary | ICD-10-CM

## 2020-01-13 DIAGNOSIS — Z833 Family history of diabetes mellitus: Secondary | ICD-10-CM

## 2020-01-13 DIAGNOSIS — Z888 Allergy status to other drugs, medicaments and biological substances status: Secondary | ICD-10-CM

## 2020-01-13 DIAGNOSIS — I824Z2 Acute embolism and thrombosis of unspecified deep veins of left distal lower extremity: Secondary | ICD-10-CM

## 2020-01-13 DIAGNOSIS — I2699 Other pulmonary embolism without acute cor pulmonale: Secondary | ICD-10-CM | POA: Diagnosis not present

## 2020-01-13 DIAGNOSIS — Z79899 Other long term (current) drug therapy: Secondary | ICD-10-CM | POA: Diagnosis not present

## 2020-01-13 DIAGNOSIS — Z7901 Long term (current) use of anticoagulants: Secondary | ICD-10-CM | POA: Diagnosis not present

## 2020-01-13 DIAGNOSIS — Z886 Allergy status to analgesic agent status: Secondary | ICD-10-CM | POA: Diagnosis not present

## 2020-01-13 DIAGNOSIS — R0602 Shortness of breath: Secondary | ICD-10-CM | POA: Diagnosis not present

## 2020-01-13 DIAGNOSIS — M7989 Other specified soft tissue disorders: Secondary | ICD-10-CM | POA: Diagnosis not present

## 2020-01-13 DIAGNOSIS — Z7189 Other specified counseling: Secondary | ICD-10-CM

## 2020-01-13 DIAGNOSIS — Z20822 Contact with and (suspected) exposure to covid-19: Secondary | ICD-10-CM | POA: Diagnosis present

## 2020-01-13 DIAGNOSIS — C73 Malignant neoplasm of thyroid gland: Secondary | ICD-10-CM | POA: Diagnosis present

## 2020-01-13 DIAGNOSIS — Z8249 Family history of ischemic heart disease and other diseases of the circulatory system: Secondary | ICD-10-CM | POA: Diagnosis not present

## 2020-01-13 DIAGNOSIS — R21 Rash and other nonspecific skin eruption: Secondary | ICD-10-CM | POA: Diagnosis present

## 2020-01-13 DIAGNOSIS — I2692 Saddle embolus of pulmonary artery without acute cor pulmonale: Secondary | ICD-10-CM | POA: Diagnosis not present

## 2020-01-13 DIAGNOSIS — G894 Chronic pain syndrome: Secondary | ICD-10-CM | POA: Diagnosis present

## 2020-01-13 DIAGNOSIS — I82402 Acute embolism and thrombosis of unspecified deep veins of left lower extremity: Secondary | ICD-10-CM | POA: Diagnosis not present

## 2020-01-13 DIAGNOSIS — R0789 Other chest pain: Secondary | ICD-10-CM | POA: Diagnosis not present

## 2020-01-13 LAB — HEPATIC FUNCTION PANEL
ALT: 23 U/L (ref 0–44)
AST: 26 U/L (ref 15–41)
Albumin: 4 g/dL (ref 3.5–5.0)
Alkaline Phosphatase: 79 U/L (ref 38–126)
Bilirubin, Direct: 0.1 mg/dL (ref 0.0–0.2)
Indirect Bilirubin: 1.2 mg/dL — ABNORMAL HIGH (ref 0.3–0.9)
Total Bilirubin: 1.3 mg/dL — ABNORMAL HIGH (ref 0.3–1.2)
Total Protein: 6.6 g/dL (ref 6.5–8.1)

## 2020-01-13 LAB — CBC WITH DIFFERENTIAL/PLATELET
Abs Immature Granulocytes: 0.03 10*3/uL (ref 0.00–0.07)
Basophils Absolute: 0 10*3/uL (ref 0.0–0.1)
Basophils Relative: 1 %
Eosinophils Absolute: 0 10*3/uL (ref 0.0–0.5)
Eosinophils Relative: 1 %
HCT: 34 % — ABNORMAL LOW (ref 39.0–52.0)
Hemoglobin: 11.2 g/dL — ABNORMAL LOW (ref 13.0–17.0)
Immature Granulocytes: 1 %
Lymphocytes Relative: 14 %
Lymphs Abs: 0.6 10*3/uL — ABNORMAL LOW (ref 0.7–4.0)
MCH: 33.7 pg (ref 26.0–34.0)
MCHC: 32.9 g/dL (ref 30.0–36.0)
MCV: 102.4 fL — ABNORMAL HIGH (ref 80.0–100.0)
Monocytes Absolute: 0.1 10*3/uL (ref 0.1–1.0)
Monocytes Relative: 3 %
Neutro Abs: 3.5 10*3/uL (ref 1.7–7.7)
Neutrophils Relative %: 80 %
Platelets: 246 10*3/uL (ref 150–400)
RBC: 3.32 MIL/uL — ABNORMAL LOW (ref 4.22–5.81)
RDW: 15.9 % — ABNORMAL HIGH (ref 11.5–15.5)
Smear Review: NORMAL
WBC: 4.3 10*3/uL (ref 4.0–10.5)
nRBC: 0 % (ref 0.0–0.2)

## 2020-01-13 LAB — BASIC METABOLIC PANEL
Anion gap: 11 (ref 5–15)
BUN: 23 mg/dL — ABNORMAL HIGH (ref 6–20)
CO2: 27 mmol/L (ref 22–32)
Calcium: 9.1 mg/dL (ref 8.9–10.3)
Chloride: 97 mmol/L — ABNORMAL LOW (ref 98–111)
Creatinine, Ser: 1.19 mg/dL (ref 0.61–1.24)
GFR, Estimated: >60 mL/min (ref >60)
Glucose, Bld: 103 mg/dL — ABNORMAL HIGH (ref 70–99)
Potassium: 3.8 mmol/L (ref 3.5–5.1)
Sodium: 135 mmol/L (ref 135–145)

## 2020-01-13 LAB — PROTIME-INR
INR: 1 (ref 0.8–1.2)
Prothrombin Time: 12.8 seconds (ref 11.4–15.2)

## 2020-01-13 LAB — TROPONIN I (HIGH SENSITIVITY)
Troponin I (High Sensitivity): 6 ng/L (ref <18)
Troponin I (High Sensitivity): 3 ng/L (ref <18)

## 2020-01-13 LAB — RESP PANEL BY RT-PCR (FLU A&B, COVID) ARPGX2
Influenza A by PCR: NEGATIVE
Influenza B by PCR: NEGATIVE
SARS Coronavirus 2 by RT PCR: NEGATIVE

## 2020-01-13 LAB — MAGNESIUM: Magnesium: 1.5 mg/dL — ABNORMAL LOW (ref 1.7–2.4)

## 2020-01-13 MED ORDER — CYCLOBENZAPRINE HCL 10 MG PO TABS: 10.0000 mg | ORAL_TABLET | Freq: Three times a day (TID) | ORAL | Status: DC | PRN

## 2020-01-13 MED ORDER — SODIUM CHLORIDE 0.9% FLUSH
10.0000 mL | Freq: Two times a day (BID) | INTRAVENOUS | Status: DC
  Administered 2020-01-14: 10 mL

## 2020-01-13 MED ORDER — SODIUM CHLORIDE 0.9% FLUSH: 3.0000 mL | INTRAVENOUS | Status: DC | PRN

## 2020-01-13 MED ORDER — OLANZAPINE 5 MG PO TABS: 10.0000 mg | ORAL_TABLET | Freq: Every day | ORAL | Status: DC

## 2020-01-13 MED ORDER — DIPHENHYDRAMINE HCL 25 MG PO CAPS
25.0000 mg | ORAL_CAPSULE | ORAL | Status: DC | PRN
  Administered 2020-01-14: 25 mg via ORAL
  Filled 2020-01-13: qty 1

## 2020-01-13 MED ORDER — SODIUM CHLORIDE 0.9 % IV SOLN
INTRAVENOUS | Status: DC
Start: 2020-01-13 — End: 2020-01-14

## 2020-01-13 MED ORDER — HEPARIN (PORCINE) 25000 UT/250ML-% IV SOLN
1600.0000 [IU]/h | INTRAVENOUS | Status: DC
  Administered 2020-01-13 – 2020-01-14 (×3): 1600 [IU]/h via INTRAVENOUS
  Filled 2020-01-13 (×2): qty 250

## 2020-01-13 MED ORDER — DOCUSATE SODIUM 100 MG PO CAPS
100.0000 mg | ORAL_CAPSULE | Freq: Two times a day (BID) | ORAL | Status: DC
  Administered 2020-01-13 – 2020-01-15 (×5): 100 mg via ORAL
  Filled 2020-01-13 (×5): qty 1

## 2020-01-13 MED ORDER — LEVOTHYROXINE SODIUM 100 MCG PO TABS
200.0000 ug | ORAL_TABLET | Freq: Every day | ORAL | Status: DC
  Administered 2020-01-14 – 2020-01-15 (×2): 200 ug via ORAL
  Filled 2020-01-13 (×2): qty 2

## 2020-01-13 MED ORDER — CLONIDINE HCL 0.1 MG PO TABS
0.1000 mg | ORAL_TABLET | Freq: Two times a day (BID) | ORAL | Status: DC
  Administered 2020-01-13 – 2020-01-15 (×4): 0.1 mg via ORAL
  Filled 2020-01-13 (×4): qty 1

## 2020-01-13 MED ORDER — METOLAZONE 5 MG PO TABS
5.0000 mg | ORAL_TABLET | Freq: Every day | ORAL | Status: DC
  Filled 2020-01-13: qty 1

## 2020-01-13 MED ORDER — TRAZODONE HCL 50 MG PO TABS
50.0000 mg | ORAL_TABLET | Freq: Once | ORAL | Status: AC
  Administered 2020-01-13: 50 mg via ORAL
  Filled 2020-01-13: qty 1

## 2020-01-13 MED ORDER — ADULT MULTIVITAMIN W/MINERALS CH
1.0000 | ORAL_TABLET | Freq: Every day | ORAL | Status: DC
  Administered 2020-01-14 – 2020-01-15 (×2): 1 via ORAL
  Filled 2020-01-13 (×2): qty 1

## 2020-01-13 MED ORDER — GABAPENTIN 300 MG PO CAPS
300.0000 mg | ORAL_CAPSULE | Freq: Three times a day (TID) | ORAL | Status: DC
  Administered 2020-01-13 – 2020-01-15 (×5): 300 mg via ORAL
  Filled 2020-01-13 (×5): qty 1

## 2020-01-13 MED ORDER — PROMETHAZINE HCL 25 MG PO TABS: 12.5000 mg | ORAL_TABLET | Freq: Four times a day (QID) | ORAL | Status: DC | PRN

## 2020-01-13 MED ORDER — LOSARTAN POTASSIUM 25 MG PO TABS: 25.0000 mg | ORAL_TABLET | Freq: Two times a day (BID) | ORAL | Status: DC

## 2020-01-13 MED ORDER — HYDROCORTISONE 1 % EX CREA
TOPICAL_CREAM | Freq: Once | CUTANEOUS | Status: AC
  Filled 2020-01-13: qty 28

## 2020-01-13 MED ORDER — SODIUM CHLORIDE 0.9 % IV SOLN: 250.0000 mL | INTRAVENOUS | Status: DC | PRN

## 2020-01-13 MED ORDER — ACETAMINOPHEN 650 MG RE SUPP: 650.0000 mg | Freq: Four times a day (QID) | RECTAL | Status: DC | PRN

## 2020-01-13 MED ORDER — HEPARIN BOLUS VIA INFUSION
5500.0000 [IU] | Freq: Once | INTRAVENOUS | Status: AC
  Administered 2020-01-13: 5500 [IU] via INTRAVENOUS

## 2020-01-13 MED ORDER — PROMETHAZINE HCL 25 MG/ML IJ SOLN
12.5000 mg | Freq: Once | INTRAMUSCULAR | Status: AC
  Administered 2020-01-13: 12.5 mg via INTRAVENOUS
  Filled 2020-01-13: qty 1

## 2020-01-13 MED ORDER — HYDROMORPHONE HCL 1 MG/ML IJ SOLN
1.0000 mg | Freq: Once | INTRAMUSCULAR | Status: AC
  Administered 2020-01-13: 1 mg via INTRAVENOUS
  Filled 2020-01-13: qty 1

## 2020-01-13 MED ORDER — MAGNESIUM SULFATE 2 GM/50ML IV SOLN
2.0000 g | Freq: Once | INTRAVENOUS | Status: AC
  Administered 2020-01-13: 2 g via INTRAVENOUS
  Filled 2020-01-13: qty 50

## 2020-01-13 MED ORDER — LORAZEPAM 0.5 MG PO TABS
0.5000 mg | ORAL_TABLET | Freq: Four times a day (QID) | ORAL | Status: DC | PRN
  Administered 2020-01-13: 0.5 mg via ORAL
  Filled 2020-01-13: qty 1

## 2020-01-13 MED ORDER — IOHEXOL 350 MG/ML SOLN
100.0000 mL | Freq: Once | INTRAVENOUS | Status: AC | PRN
  Administered 2020-01-13: 69 mL via INTRAVENOUS

## 2020-01-13 MED ORDER — HYDROMORPHONE HCL 1 MG/ML IJ SOLN
0.5000 mg | INTRAMUSCULAR | Status: DC | PRN
  Administered 2020-01-13 – 2020-01-15 (×9): 1 mg via INTRAVENOUS
  Filled 2020-01-13 (×9): qty 1

## 2020-01-13 MED ORDER — PROMETHAZINE HCL 25 MG/ML IJ SOLN: 12.5000 mg | Freq: Four times a day (QID) | INTRAMUSCULAR | Status: DC | PRN

## 2020-01-13 MED ORDER — DIPHENHYDRAMINE HCL 50 MG/ML IJ SOLN
25.0000 mg | Freq: Once | INTRAMUSCULAR | Status: AC
Start: 2020-01-13 — End: 2020-01-13
  Administered 2020-01-13: 25 mg via INTRAVENOUS
  Filled 2020-01-13: qty 1

## 2020-01-13 MED ORDER — ONDANSETRON HCL 4 MG/2ML IJ SOLN: 4.0000 mg | Freq: Four times a day (QID) | INTRAMUSCULAR | Status: DC | PRN

## 2020-01-13 MED ORDER — OXYCODONE HCL 5 MG PO TABS
5.0000 mg | ORAL_TABLET | ORAL | Status: DC | PRN
  Administered 2020-01-13: 5 mg via ORAL
  Filled 2020-01-13: qty 1

## 2020-01-13 MED ORDER — MAGIC MOUTHWASH: 10.0000 mL | Freq: Four times a day (QID) | ORAL | Status: DC | PRN

## 2020-01-13 MED ORDER — SODIUM CHLORIDE 0.9% FLUSH
3.0000 mL | Freq: Two times a day (BID) | INTRAVENOUS | Status: DC
  Administered 2020-01-14 (×2): 3 mL via INTRAVENOUS

## 2020-01-13 MED ORDER — HYDRALAZINE HCL 25 MG PO TABS: 25.0000 mg | ORAL_TABLET | Freq: Four times a day (QID) | ORAL | Status: DC | PRN

## 2020-01-13 MED ORDER — PREDNISONE 50 MG PO TABS
50.0000 mg | ORAL_TABLET | Freq: Every day | ORAL | Status: DC
  Administered 2020-01-13 – 2020-01-15 (×3): 50 mg via ORAL
  Filled 2020-01-13 (×3): qty 1

## 2020-01-13 MED ORDER — ACETAMINOPHEN 325 MG PO TABS: 650.0000 mg | ORAL_TABLET | Freq: Four times a day (QID) | ORAL | Status: DC | PRN

## 2020-01-13 MED ORDER — CHLORHEXIDINE GLUCONATE CLOTH 2 % EX PADS: 6.0000 | MEDICATED_PAD | Freq: Every day | CUTANEOUS | Status: DC

## 2020-01-13 MED ORDER — METHYLPREDNISOLONE SODIUM SUCC 125 MG IJ SOLR
125.0000 mg | Freq: Once | INTRAMUSCULAR | Status: AC
Start: 2020-01-13 — End: 2020-01-13
  Administered 2020-01-13: 125 mg via INTRAVENOUS
  Filled 2020-01-13: qty 2

## 2020-01-13 MED ORDER — FAMOTIDINE IN NACL 20-0.9 MG/50ML-% IV SOLN
20.0000 mg | Freq: Once | INTRAVENOUS | Status: AC
Start: 2020-01-13 — End: 2020-01-13
  Administered 2020-01-13: 20 mg via INTRAVENOUS
  Filled 2020-01-13: qty 50

## 2020-01-13 MED ORDER — METHYLPHENIDATE HCL 10 MG PO TABS
10.0000 mg | ORAL_TABLET | Freq: Two times a day (BID) | ORAL | Status: DC
  Administered 2020-01-14 – 2020-01-15 (×2): 10 mg via ORAL
  Filled 2020-01-13 (×3): qty 1

## 2020-01-13 MED ORDER — SODIUM CHLORIDE 0.9% FLUSH: 10.0000 mL | INTRAVENOUS | Status: DC | PRN

## 2020-01-13 MED ORDER — POTASSIUM CHLORIDE CRYS ER 10 MEQ PO TBCR
10.0000 meq | EXTENDED_RELEASE_TABLET | Freq: Every day | ORAL | Status: DC
  Administered 2020-01-14 – 2020-01-15 (×2): 10 meq via ORAL
  Filled 2020-01-13 (×2): qty 1

## 2020-01-13 MED ORDER — FUROSEMIDE 40 MG PO TABS
80.0000 mg | ORAL_TABLET | Freq: Every day | ORAL | Status: DC
  Administered 2020-01-14: 80 mg via ORAL
  Filled 2020-01-13 (×2): qty 2

## 2020-01-13 MED ORDER — LEVOTHYROXINE SODIUM 88 MCG PO TABS
88.0000 ug | ORAL_TABLET | Freq: Every day | ORAL | Status: DC
  Administered 2020-01-14 – 2020-01-15 (×2): 88 ug via ORAL
  Filled 2020-01-13 (×2): qty 1

## 2020-01-13 MED ORDER — ONDANSETRON HCL 4 MG PO TABS: 4.0000 mg | ORAL_TABLET | Freq: Four times a day (QID) | ORAL | Status: DC | PRN

## 2020-01-13 MED ORDER — SODIUM CHLORIDE 0.9 % IV BOLUS
1000.0000 mL | Freq: Once | INTRAVENOUS | Status: AC
  Administered 2020-01-13: 1000 mL via INTRAVENOUS

## 2020-01-13 NOTE — H&P (Signed)
History and Physical    Brian [name] FYB:[id number] DOB: 01-May-1975 DOA: 01/13/2020  PCP: Sharyne Richters, MD  Patient coming from: Home via [facility]  I have personally briefly reviewed patient's old medical records available.   Chief Complaint: Rashes in the face, shortness of breath and fatigue.  HPI: Brian [name] is a 44 y.o. 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  Patient has metastatic cancer, he is on chemotherapy with last chemo on 11/23 with Adriamycin.  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  Last 2 days he has been trying to work around however is getting extremely weak, break out in sweats and become short of breath on minimal exertion.  Gradually worsening symptoms.  Also has dull right upper quadrant pain, about 5 out of 10, intermittent with no radiation.  No relieving or exacerbating factors. Patient without any fever or chills.  Denies any headache nausea or vomiting.  Appetite is poor overall.  Denies any chest pain.  No cough or sputum production.  Bowel and bladder habits are normal. ED Course: Blood pressure is stable.  He is tachycardic with heart rate more than 100-127 on mobility.  On room air.  Electrolytes are stable.  Magnesium is 1.5. Chest x-ray was essentially normal.  A CT scan of the chest showed bilateral multiple territory pulmonary embolism with no right ventricular strain. Started on heparin and advised observation in the hospital.  Review of Systems: all systems are reviewed and pertinent positive as per HPI otherwise rest are negative.    Past Medical History:  Diagnosis Date  . Essential hypertension 09/09/2018  . Kidney stone   . Primary cancer of thyroid with metastasis to other site ([place]) 05/04/2018   . Rickettsia infection     Past Surgical History:  Procedure Laterality Date  . BIOPSY  04/05/2018   Procedure: BIOPSY;  Surgeon: Ronald Lobo, MD;  Location: [facility];  Service: Endoscopy;;  . CHOLECYSTECTOMY    . ESOPHAGOGASTRODUODENOSCOPY (EGD) WITH PROPOFOL N/A 04/05/2018   Procedure: ESOPHAGOGASTRODUODENOSCOPY (EGD) WITH PROPOFOL;  Surgeon: Ronald Lobo, MD;  Location: [facility];  Service: Endoscopy;  Laterality: N/A;  . IR IMAGING GUIDED PORT INSERTION  09/13/2019  . IR RADIOLOGIST EVAL & MGMT  10/11/2018     reports that he has never smoked. He has never used smokeless tobacco. He reports that he does not drink alcohol and does not use drugs.  Allergies  Allergen Reactions  . Dextromethorphan-Guaifenesin Other (See Comments)    Irregular heartbeat   . Doxycycline Anaphylaxis, Nausea And Vomiting, Rash and Other (See Comments)    "heart arrythmia" and "dyspepsia" (only oral doxycycline causes reaction)  . Gadobutrol Hives and Other (See Comments)    Patient had MRI scan at [facility]. Patient called one hour after he left imaging facility to report two "blisters" that came up on "back" of lip.    . Gadolinium Derivatives Hives    Patient had MRI scan at [facility]. Patient called one hour after he left imaging facility to report two "blisters" that came up on "back" of lip.   . Guaifenesin Palpitations       . Ibuprofen Hives and Itching  . Lisinopril Other (See Comments)    Angioedema  . Pantoprazole Itching  . Tramadol Hives and  Itching  . Barium Rash    Developed redness around neck after drinking 1st bottle of Barocat; pt was given Benedryl by ED Mds to be "on the safe side" before drinking 2nd bottle    Family History  Problem Relation Age of Onset  . Diabetes Mother   . Heart disease Mother   . Heart failure Mother   . Heart attack Mother   . Hyperlipidemia Mother   . Hypertension Mother   . Hyperlipidemia Father   . Allergic  rhinitis Father   . Rashes / Skin problems Father   . Sudden death Neg Hx   . Thyroid disease Neg Hx      Prior to Admission medications   Medication Sig Start Date End Date Taking? Authorizing Provider  baclofen (LIORESAL) 10 MG tablet TAKE 1 TABLET BY MOUTH THREE TIMES DAILY 01/13/20  Yes Ennever, Rudell Cobb, MD  furosemide (LASIX) 80 MG tablet TAKE 1 TABLET (80 MG TOTAL) BY MOUTH DAILY. 11/24/19  Yes Ennever, Rudell Cobb, MD  gabapentin (NEURONTIN) 300 MG capsule TAKE 1 CAPSULE (300 MG TOTAL) BY MOUTH 3 (THREE) TIMES DAILY. 11/13/19  Yes [name], MD  levothyroxine (SYNTHROID) 200 MCG tablet Take by mouth daily.  10/16/19  Yes [provider]  levothyroxine (SYNTHROID) 88 MCG tablet Take by mouth. 10/25/19  Yes [provider]  methylphenidate (RITALIN) 10 MG tablet Take 1 tablet (10 mg total) by mouth 2 (two) times daily. 11/27/19  Yes Ennever, Rudell Cobb, MD  metolazone (ZAROXOLYN) 5 MG tablet TAKE 1 TABLET (5 MG TOTAL) BY MOUTH DAILY. TAKE 1 HOUR BEFORE LASIX. 11/24/19  Yes [name], MD  Multiple Vitamin (MULTIVITAMIN) capsule Take 1 capsule by mouth daily.   Yes [provider]  OLANZapine (ZYPREXA) 10 MG tablet Take 1 tablet (10 mg total) by mouth at bedtime. 11/27/19  Yes [name], MD  ondansetron (ZOFRAN) 8 MG tablet Take 8 mg by mouth every 8 (eight) hours as needed for nausea or vomiting.  07/29/18  Yes [provider]  oxyCODONE (OXYCONTIN) 20 mg 12 hr tablet Take 1 tablet (20 mg total) by mouth every 12 (twelve) hours. 11/27/19  Yes Ennever, Rudell Cobb, MD  potassium chloride (KLOR-CON) 10 MEQ tablet TAKE 1 TABLET (10 MEQ TOTAL) BY MOUTH DAILY. 11/13/19  Yes [place], Holli Humbles, NP  cloNIDine (CATAPRES) 0.1 MG tablet Take 0.1 mg by mouth 2 (two) times daily.  09/06/19   [provider]  cloNIDine (CATAPRES) 0.2 MG tablet Take 0.2 mg by mouth 3 times/day as needed-between meals & bedtime.  10/02/19   [provider]   cyclobenzaprine (FLEXERIL) 10 MG tablet Take 1 tablet (10 mg total) by mouth 3 (three) times daily as needed for muscle spasms. 11/27/19   [name], MD  dexamethasone (DECADRON) 4 MG tablet TAKE 1 TABLET (4 MG TOTAL) BY MOUTH 2 (TWO) TIMES DAILY WITH A MEAL. TAKE 2 TABS TWICE A DAY FOR 4 DAYS -- START DAY BEFORE EACH CHEMOTHERAPY 11/13/19   [name], MD  dexamethasone (DECADRON) 4 MG tablet Take 2 tablets (8 mg total) by mouth daily. Start the day after chemotherapy for 2 days. 12/05/19   [name], MD  EPINEPHrine (EPIPEN 2-PAK) 0.3 mg/0.3 mL IJ SOAJ injection USE AS DIRECTED FOR LIFE THREATENING ALLERGIC REACTIONS 04/30/15   Kozlow, Donnamarie Poag, MD  fluconazole (DIFLUCAN) 100 MG tablet Take 1 tablet (100 mg total) by mouth daily. 12/21/19   [name], MD  isosorbide mononitrate (IMDUR) 60 MG 24 hr tablet Take 1 tablet (60 mg total) by mouth daily. 02/14/19 05/15/19  Park Liter, MD  JANSSEN COVID-19 VACCINE 0.5 ML injection  11/14/19   [provider]  levofloxacin (LEVAQUIN) 500 MG tablet Take 1 tablet (500 mg total) by mouth daily. Take daily for 14 days -- start day AFTER each cehmotherapy 09/06/19   [name], MD  lidocaine-prilocaine (EMLA) cream Apply to affected area once 12/05/19   Ennever, Rudell Cobb, MD  LORazepam (ATIVAN) 0.5 MG tablet TAKE 1 TABLET (0.5 MG TOTAL) BY MOUTH EVERY 6 (SIX) HOURS AS NEEDED (NAUSEA OR VOMITING). 01/13/20 04/12/20  [name], MD  losartan (COZAAR) 25 MG tablet Take 1 tablet (25 mg total) by mouth 2 (two) times daily. 03/02/19   Park Liter, MD  magic mouthwash SOLN Take 10 mLs by mouth 4 (four) times daily as needed for mouth pain. 10/06/19   [name], MD  metoCLOPramide (REGLAN) 10 MG tablet TAKE 1 TABLET BY MOUTH FOUR TIMES DAILY BEFORE MEALS AND AT BEDTIME 06/01/19   [name], MD  ondansetron (ZOFRAN) 8 MG tablet Take 1 tablet (8 mg total) by mouth 2 (two) times daily as needed for refractory  nausea / vomiting. Start on day 3 after chemo. 12/05/19   [name], MD  Oxycodone HCl 10 MG TABS TAKE 1 TABLET (10 MG TOTAL) BY MOUTH EVERY 6 (SIX) HOURS AS NEEDED. 01/02/20   [name], MD  predniSONE (DELTASONE) 50 MG tablet 13 HOUR PREP - TAKE 1 TABLET BY MOUTH 13 HOURS,7 HOURS,AND 1 HOUR PRIOR TO SCAN;THE SCAN SHOULD BE PERFORMED WITHIN 6 HOURS OF LAST DOSE 11/16/19   [name], MD  prochlorperazine (COMPAZINE) 10 MG tablet Take 1 tablet (10 mg total) by mouth every 6 (six) hours as needed (Nausea or vomiting). 12/05/19   [name], MD  promethazine (PHENERGAN) 12.5 MG tablet Take 1 tablet (12.5 mg total) by mouth at bedtime. 10/12/19   [name], MD  promethazine (PHENERGAN) 25 MG tablet TAKE 1 TABLET BY MOUTH EVERY 6 HOURS AS NEEDED NAUSEA AND/OR VOMITING 11/13/19   [name], MD  sucralfate (CARAFATE) 1 GM/10ML suspension Take 10 mLs (1 g total) by mouth 4 (four) times daily -  with meals and at bedtime. 10/05/19   [name], MD  TURMERIC CURCUMIN PO Take 1 capsule by mouth daily.    [provider]    Physical Exam: Vitals:   01/13/20 1400 01/13/20 1538 01/13/20 1627 01/13/20 1730  BP: (!) 156/99 (!) 146/103 (!) 145/105   Pulse: (!) 104 100 (!) 102   Resp: 20 (!) 22 (!) 24   Temp:   98.3 F (36.8 C)   TempSrc:      SpO2: 92% 91% 94%   Weight:    122.5 kg  Height:    5\' 10"  (1.778 m)    Constitutional: NAD, calm, comfortable Vitals:   01/13/20 1400 01/13/20 1538 01/13/20 1627 01/13/20 1730  BP: (!) 156/99 (!) 146/103 (!) 145/105   Pulse: (!) 104 100 (!) 102   Resp: 20 (!) 22 (!) 24   Temp:   98.3 F (36.8 C)   TempSrc:      SpO2: 92% 91% 94%   Weight:    122.5 kg  Height:    5\' 10"  (1.778 m)   Eyes: PERRL, lids and conjunctivae normal ENMT: Mucous membranes are moist. Posterior pharynx clear  of any exudate or lesions.Normal dentition.  Neck: normal, supple, no masses, postsurgical thyroid scar present.   Nontender. Respiratory: clear to auscultation bilaterally, no wheezing, no crackles. Normal respiratory effort. No accessory muscle use.  No added sounds. Cardiovascular: Regular rate and rhythm, no murmurs / rubs / gallops. No extremity edema. 2+ pedal pulses. No carotid bruits.  Tachycardic. Abdomen: no tenderness, no masses palpated. No hepatosplenomegaly. Bowel sounds positive.  Musculoskeletal: no clubbing / cyanosis. No joint deformity upper and lower extremities. Good ROM, no contractures. Normal muscle tone.  Skin: Patient has purpuric rashes forehead, bridge of the nose and also on both cheeks. Neurologic: CN 2-12 grossly intact. Sensation intact, DTR normal. Strength 5/5 in all 4.  Psychiatric: Normal judgment and insight. Alert and oriented x 3. Normal mood.     Labs on Admission: I have personally reviewed following labs and imaging studies  CBC: Recent Labs  Lab 01/09/20 1335 01/13/20 1332  WBC 4.2 4.3  NEUTROABS 2.9 3.5  HGB 10.4* 11.2*  HCT 32.1* 34.0*  MCV 102.9* 102.4*  PLT 206 469   Basic Metabolic Panel: Recent Labs  Lab 01/09/20 1335 01/13/20 1332  NA 139 135  K 2.9* 3.8  CL 94* 97*  CO2 35* 27  GLUCOSE 115* 103*  BUN 24* 23*  CREATININE 1.38* 1.19  CALCIUM 9.3 9.1  MG  --  1.5*   GFR: Estimated Creatinine Clearance: 104 mL/min (by C-G formula based on SCr of 1.19 mg/dL). Liver Function Tests: Recent Labs  Lab 01/09/20 1335 01/13/20 1332  AST 25 26  ALT 26 23  ALKPHOS 83 79  BILITOT 0.6 1.3*  PROT 6.5 6.6  ALBUMIN 4.1 4.0   No results for input(s): LIPASE, AMYLASE in the last 168 hours. No results for input(s): AMMONIA in the last 168 hours. Coagulation Profile: Recent Labs  Lab 01/13/20 1332  INR 1.0   Cardiac Enzymes: No results for input(s): CKTOTAL, CKMB, CKMBINDEX, TROPONINI in the last 168 hours. BNP (last 3 results) No results for input(s): PROBNP in the last 8760 hours. HbA1C: No results for input(s): HGBA1C in the last  72 hours. CBG: No results for input(s): GLUCAP in the last 168 hours. Lipid Profile: No results for input(s): CHOL, HDL, LDLCALC, TRIG, CHOLHDL, LDLDIRECT in the last 72 hours. Thyroid Function Tests: No results for input(s): TSH, T4TOTAL, FREET4, T3FREE, THYROIDAB in the last 72 hours. Anemia Panel: No results for input(s): VITAMINB12, FOLATE, FERRITIN, TIBC, IRON, RETICCTPCT in the last 72 hours. Urine analysis:    Component Value Date/Time   COLORURINE YELLOW 09/06/2019 1403   APPEARANCEUR CLEAR 09/06/2019 1403   LABSPEC >1.030 (H) 09/06/2019 1403   PHURINE 7.5 09/06/2019 1403   GLUCOSEU NEGATIVE 09/06/2019 1403   HGBUR NEGATIVE 09/06/2019 1403   BILIRUBINUR NEGATIVE 09/06/2019 1403   BILIRUBINUR negative 06/11/2013 1309   KETONESUR NEGATIVE 09/06/2019 1403   PROTEINUR negative 01/04/2020 1432   UROBILINOGEN 0.2 06/12/2013 0149   NITRITE NEGATIVE 09/06/2019 1403   LEUKOCYTESUR NEGATIVE 09/06/2019 1403    Radiological Exams on Admission: CT Angio Chest PE W and/or Wo Contrast  Result Date: 01/13/2020 CLINICAL DATA:  Shortness of breath. History of metastatic thyroid carcinoma EXAM: CT ANGIOGRAPHY CHEST WITH CONTRAST TECHNIQUE: Multidetector CT imaging of the chest was performed using the standard protocol during bolus administration of intravenous contrast. Multiplanar CT image reconstructions and MIPs were obtained to evaluate the vascular anatomy. CONTRAST:  69 mL OMNIPAQUE IOHEXOL 350 MG/ML SOLN COMPARISON:  Chest CT July 27, 2019; chest radiograph  January 13, 2020 FINDINGS: Cardiovascular: There are multiple pulmonary emboli bilaterally. Pulmonary embolus on the left arises from the distal main pulmonary outflow tract with multiple upper and lower lobe pulmonary emboli seen on the left. On the right, there are multiple lower lobe and to a lesser extent upper lobe pulmonary artery branches containing pulmonary emboli. No right main pulmonary artery emboli. The right ventricle to  left ventricle diameter ratio is less than 0.9, not indicative of right heart strain. No thoracic aortic aneurysm. Visualized great vessels appear normal. No pericardial effusion or pericardial thickening evident. Mediastinum/Nodes: There is again noted fluid in the right lower neck region, less than on prior study. Apparent residual cystic area measures 4.1 x 2.3 cm. This fluid collection appears separate from the thyroid. No thyroid lesions are evident. Only a small amount of thyroid tissue is evident currently. There is no appreciable thoracic adenopathy. No esophageal lesions are appreciable. Lungs/Pleura: There are scattered areas of atelectatic change. There is no edema or consolidation. No pulmonary nodular lesions are appreciable. There is an azygos lobe on the right, an anatomic variant. There are no pleural effusions. Upper Abdomen: There is ill-defined opacity in the anterior segment right lobe of the liver measuring 3.3 x 2.7 cm, a potential metastatic focus. Gallbladder is absent. Visualized upper abdominal structures appear normal. Musculoskeletal: There is degenerative change in the thoracic spine. No blastic or lytic bone lesions. No chest wall lesions are evident. Review of the MIP images confirms the above findings. IMPRESSION: 1. Multifocal pulmonary emboli, somewhat more on the left than on the right, without evident right heart strain. 2. Apparent metastasis in the right lobe of the liver, also present previously. 3. Fluid collection in the right lower neck region, smaller than on previous study and likely of postoperative etiology. 4. Scattered areas of atelectasis. No pulmonary nodular lesions are consolidation. 5.  No adenopathy evident. 6.  Gallbladder absent. Critical Value/emergent results were called by telephone at the time of interpretation on 01/13/2020 at 3:02 pm to provider [facility] who verbally acknowledged these results. Electronically Signed   By: Lowella Grip III  M.D.   On: 01/13/2020 15:03   DG Chest Port 1 View  Result Date: 01/13/2020 CLINICAL DATA:  Shortness of breath.  Thyroid carcinoma EXAM: PORTABLE CHEST 1 VIEW COMPARISON:  November 12, 2018 chest radiograph; chest CT July 27, 2019. FINDINGS: There is slight atelectasis in the bases. The lungs elsewhere are clear. There is an azygos lobe on the right, an anatomic variant. Heart size and pulmonary vascularity are normal. Port-A-Cath tip is at the cavoatrial junction. No pneumothorax. No bone lesions. IMPRESSION: Mild bibasilar atelectasis. Lungs elsewhere clear. Cardiac silhouette normal. No adenopathy evident. Port-A-Cath tip cavoatrial junction. Electronically Signed   By: Lowella Grip III M.D.   On: 01/13/2020 13:22    EKG: Independently reviewed.  Normal sinus rhythm.  No acute ST-T wave changes.  Assessment/Plan Principal Problem:   Pulmonary embolism without acute cor pulmonale (HCC) Active Problems:   Gastroesophageal reflux disease without esophagitis   Primary cancer of thyroid with metastasis to other site [facility])   Essential hypertension     1.  Pulmonary embolism without cor pulmonale, bilateral PE: Provoked PE due to metastatic cancer. Agree with monitoring in the hospital given severity of symptoms.  Will start patient on heparin infusion overnight, once symptoms controlled will discharge patient on Lovenox subcutaneous injections. He will follow up with oncology to determine course and duration of therapy. Gradual mobility.  2.  Metastatic thyroid cancer on chemotherapy: Followed by oncology.  He will continue outpatient care.  3.  Hypertension: Blood pressure elevated.  He is not taking any treatment at home.  Will resume low-dose clonidine.  As needed hydralazine.  4.  Chemotherapy-induced pain/nausea: He will use symptomatic treatment.  Oxycodone.  Ativan and Phenergan for nausea.  5.  Hypomagnesemia: Replaced with IV magnesium.  We will recheck level tomorrow  morning.   DVT prophylaxis: Heparin infusion Code Status: Full code Family Communication: Father at the bedside Disposition Plan: Home.  Anticipate tomorrow. Consults called: None Admission status: Observation, cardiac telemetry   Barb Merino MD [facility] Pager [phone]

## 2020-01-13 NOTE — ED Provider Notes (Signed)
[facility] EMERGENCY DEPARTMENT Provider Note   CSN: [id number] Arrival date & time: 01/13/20  1246     History Chief Complaint  Patient presents with  . Rash  . Shortness of Breath    Brian [name] is a 44 y.o. male.  Pt presents to the ED today with a rash to his face, neck, and both arms.  Pt has also been increasingly sob with exertion.  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  He had been on cabometyx (d/c on 5/21 due to progression of disease), Lenvima (on hold due to proteinuria), and taxotere/cddp started on 7/28.  Pt had his cycle of adriamycin on 11/23.  His K was 2.9, so he was also given Kdur.  Pt works in the ED here.  He was very tired and slept most of the day on Thursday, 11/25.  He did work yesterday.  I was here and he looked worn out, but was looking ok.  He said he broke out in a rash in the evening when he got home.  He had tremors in his arms last night.  He said he's been breaking out in sweats and has had more sob.  He has pain to his liver (location of his mets).        Past Medical History:  Diagnosis Date  . Essential hypertension 09/09/2018  . Kidney stone   . Primary cancer of thyroid with metastasis to other site ([place]) 05/04/2018  . Rickettsia infection     Patient Active Problem List   Diagnosis Date Noted  . Pulmonary embolism without acute cor pulmonale ([place]) 01/13/2020  . Essential hypertension 09/09/2018  . Primary cancer of thyroid with metastasis to other site ([place]) 05/04/2018  . Extrasystole 04/26/2018  . Dyspnea on exertion 04/26/2018  . Atypical chest pain 04/26/2018  . Ureteral stone with hydronephrosis 02/15/2018  . Right thyroid nodule 04/09/2017  . Bilateral knee pain 07/01/2016  . Left foot pain 02/06/2016  . Gastroesophageal reflux disease without esophagitis 05/08/2014  . Thrombocytopenia, unspecified ([place]) 06/13/2013  . 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 Low back pain  05/12/2012  . Allergic rhinitis 06/03/2009    Past Surgical History:  Procedure Laterality Date  . BIOPSY  04/05/2018   Procedure: BIOPSY;  Surgeon: Ronald Lobo, MD;  Location: [facility];  Service: Endoscopy;;  . CHOLECYSTECTOMY    . ESOPHAGOGASTRODUODENOSCOPY (EGD) WITH PROPOFOL N/A 04/05/2018   Procedure: ESOPHAGOGASTRODUODENOSCOPY (EGD) WITH PROPOFOL;  Surgeon: Ronald Lobo, MD;  Location: [facility];  Service: Endoscopy;  Laterality: N/A;  . IR IMAGING GUIDED PORT INSERTION  09/13/2019  . IR RADIOLOGIST EVAL & MGMT  10/11/2018       Family History  Problem Relation Age of Onset  . Diabetes Mother   . Heart disease Mother   . Heart failure Mother   . Heart attack Mother   . Hyperlipidemia Mother   . Hypertension Mother   . Hyperlipidemia Father   . Allergic rhinitis Father   . Rashes / Skin problems Father   . Sudden death Neg Hx   . Thyroid disease Neg Hx     Social History   Tobacco Use  . Smoking status: Never Smoker  . Smokeless tobacco: Never Used  Vaping Use  . Vaping Use: Never used  Substance Use Topics  . Alcohol use: No  . Drug use: No    Home Medications Prior to Admission medications   Medication Sig Start Date End  Date Taking? Authorizing Provider  baclofen (LIORESAL) 10 MG tablet TAKE 1 TABLET BY MOUTH THREE TIMES DAILY 01/13/20  Yes Ennever, Rudell Cobb, MD  furosemide (LASIX) 80 MG tablet TAKE 1 TABLET (80 MG TOTAL) BY MOUTH DAILY. 11/24/19  Yes Ennever, Rudell Cobb, MD  gabapentin (NEURONTIN) 300 MG capsule TAKE 1 CAPSULE (300 MG TOTAL) BY MOUTH 3 (THREE) TIMES DAILY. 11/13/19  Yes [name], MD  levothyroxine (SYNTHROID) 200 MCG tablet Take by mouth daily.  10/16/19  Yes [provider]  levothyroxine (SYNTHROID) 88 MCG tablet Take by mouth. 10/25/19  Yes [provider]  methylphenidate (RITALIN) 10 MG tablet Take 1 tablet (10 mg total) by mouth 2 (two) times daily. 11/27/19  Yes Ennever, Rudell Cobb, MD  metolazone  (ZAROXOLYN) 5 MG tablet TAKE 1 TABLET (5 MG TOTAL) BY MOUTH DAILY. TAKE 1 HOUR BEFORE LASIX. 11/24/19  Yes [name], MD  Multiple Vitamin (MULTIVITAMIN) capsule Take 1 capsule by mouth daily.   Yes [provider]  OLANZapine (ZYPREXA) 10 MG tablet Take 1 tablet (10 mg total) by mouth at bedtime. 11/27/19  Yes [name], MD  ondansetron (ZOFRAN) 8 MG tablet Take 8 mg by mouth every 8 (eight) hours as needed for nausea or vomiting.  07/29/18  Yes [provider]  oxyCODONE (OXYCONTIN) 20 mg 12 hr tablet Take 1 tablet (20 mg total) by mouth every 12 (twelve) hours. 11/27/19  Yes Ennever, Rudell Cobb, MD  potassium chloride (KLOR-CON) 10 MEQ tablet TAKE 1 TABLET (10 MEQ TOTAL) BY MOUTH DAILY. 11/13/19  Yes [place], Holli Humbles, NP  cloNIDine (CATAPRES) 0.1 MG tablet Take 0.1 mg by mouth 2 (two) times daily.  09/06/19   [provider]  cloNIDine (CATAPRES) 0.2 MG tablet Take 0.2 mg by mouth 3 times/day as needed-between meals & bedtime.  10/02/19   [provider]  cyclobenzaprine (FLEXERIL) 10 MG tablet Take 1 tablet (10 mg total) by mouth 3 (three) times daily as needed for muscle spasms. 11/27/19   [name], MD  dexamethasone (DECADRON) 4 MG tablet TAKE 1 TABLET (4 MG TOTAL) BY MOUTH 2 (TWO) TIMES DAILY WITH A MEAL. TAKE 2 TABS TWICE A DAY FOR 4 DAYS -- START DAY BEFORE EACH CHEMOTHERAPY 11/13/19   [name], MD  dexamethasone (DECADRON) 4 MG tablet Take 2 tablets (8 mg total) by mouth daily. Start the day after chemotherapy for 2 days. 12/05/19   [name], MD  EPINEPHrine (EPIPEN 2-PAK) 0.3 mg/0.3 mL IJ SOAJ injection USE AS DIRECTED FOR LIFE THREATENING ALLERGIC REACTIONS 04/30/15   Kozlow, Donnamarie Poag, MD  fluconazole (DIFLUCAN) 100 MG tablet Take 1 tablet (100 mg total) by mouth daily. 12/21/19   [name], MD  isosorbide mononitrate (IMDUR) 60 MG 24 hr tablet Take 1 tablet (60 mg total) by mouth daily. 02/14/19 05/15/19  Park Liter, MD  JANSSEN COVID-19 VACCINE 0.5 ML injection  11/14/19   [provider]  levofloxacin (LEVAQUIN) 500 MG tablet Take 1 tablet (500 mg total) by mouth daily. Take daily for 14 days -- start day AFTER each cehmotherapy 09/06/19   [name], MD  lidocaine-prilocaine (EMLA) cream Apply to affected area once 12/05/19   Ennever, Rudell Cobb, MD  LORazepam (ATIVAN) 0.5 MG tablet TAKE 1 TABLET (0.5 MG TOTAL) BY MOUTH EVERY 6 (SIX) HOURS AS NEEDED (NAUSEA OR VOMITING). 01/13/20 04/12/20  [name], MD  losartan (COZAAR) 25 MG tablet Take 1 tablet (  25 mg total) by mouth 2 (two) times daily. 03/02/19   Park Liter, MD  magic mouthwash SOLN Take 10 mLs by mouth 4 (four) times daily as needed for mouth pain. 10/06/19   [name], MD  metoCLOPramide (REGLAN) 10 MG tablet TAKE 1 TABLET BY MOUTH FOUR TIMES DAILY BEFORE MEALS AND AT BEDTIME 06/01/19   [name], MD  ondansetron (ZOFRAN) 8 MG tablet Take 1 tablet (8 mg total) by mouth 2 (two) times daily as needed for refractory nausea / vomiting. Start on day 3 after chemo. 12/05/19   [name], MD  Oxycodone HCl 10 MG TABS TAKE 1 TABLET (10 MG TOTAL) BY MOUTH EVERY 6 (SIX) HOURS AS NEEDED. 01/02/20   [name], MD  predniSONE (DELTASONE) 50 MG tablet 13 HOUR PREP - TAKE 1 TABLET BY MOUTH 13 HOURS,7 HOURS,AND 1 HOUR PRIOR TO SCAN;THE SCAN SHOULD BE PERFORMED WITHIN 6 HOURS OF LAST DOSE 11/16/19   [name], MD  prochlorperazine (COMPAZINE) 10 MG tablet Take 1 tablet (10 mg total) by mouth every 6 (six) hours as needed (Nausea or vomiting). 12/05/19   [name], MD  promethazine (PHENERGAN) 12.5 MG tablet Take 1 tablet (12.5 mg total) by mouth at bedtime. 10/12/19   [name], MD  promethazine (PHENERGAN) 25 MG tablet TAKE 1 TABLET BY MOUTH EVERY 6 HOURS AS NEEDED NAUSEA AND/OR VOMITING 11/13/19   [name], MD  sucralfate (CARAFATE) 1 GM/10ML suspension Take 10 mLs (1 g total) by mouth  4 (four) times daily -  with meals and at bedtime. 10/05/19   [name], MD  TURMERIC CURCUMIN PO Take 1 capsule by mouth daily.    [provider]    Allergies    Dextromethorphan-guaifenesin, Doxycycline, Gadobutrol, Gadolinium derivatives, Guaifenesin, Ibuprofen, Lisinopril, Pantoprazole, Tramadol, and Barium  Review of Systems   Review of Systems  Skin: Positive for rash.  All other systems reviewed and are negative.   Physical Exam Updated Vital Signs BP (!) 146/103 (BP Location: Right Arm)   Pulse 100   Temp 98.5 F (36.9 C) (Oral)   Resp (!) 22   Ht 5\' 10"  (1.778 m)   Wt 130 kg   SpO2 91%   BMI 41.12 kg/m   Physical Exam Vitals and nursing note reviewed.  Constitutional:      Appearance: Normal appearance. He is ill-appearing.  HENT:     Head: Normocephalic and atraumatic.     Right Ear: External ear normal.     Nose: Nose normal.     Mouth/Throat:     Mouth: Mucous membranes are dry.  Eyes:     Extraocular Movements: Extraocular movements intact.     Conjunctiva/sclera: Conjunctivae normal.     Pupils: Pupils are equal, round, and reactive to light.  Cardiovascular:     Rate and Rhythm: Regular rhythm. Tachycardia present.     Pulses: Normal pulses.     Heart sounds: Normal heart sounds.  Pulmonary:     Effort: Tachypnea present.     Breath sounds: Normal breath sounds.  Abdominal:     General: Abdomen is flat. Bowel sounds are normal.     Palpations: Abdomen is soft.  Musculoskeletal:        General: Swelling present.     Cervical back: Normal range of motion and neck supple.     Right lower leg: Edema present.     Left lower leg: Edema present.  Skin:  Capillary Refill: Capillary refill takes less than 2 seconds.     Findings: Rash present.     Comments: Rash to face, neck  Neurological:     General: No focal deficit present.     Mental Status: He is alert and oriented to person, place, and time.  Psychiatric:        Mood and  Affect: Mood normal.        Behavior: Behavior normal.        Thought Content: Thought content normal.        Judgment: Judgment normal.     ED Results / Procedures / Treatments   Labs (all labs ordered are listed, but only abnormal results are displayed) Labs Reviewed  BASIC METABOLIC PANEL - Abnormal; Notable for the following components:      Result Value   Chloride 97 (*)    Glucose, Bld 103 (*)    BUN 23 (*)    All other components within normal limits  CBC WITH DIFFERENTIAL/PLATELET - Abnormal; Notable for the following components:   RBC 3.32 (*)    Hemoglobin 11.2 (*)    HCT 34.0 (*)    MCV 102.4 (*)    RDW 15.9 (*)    Lymphs Abs 0.6 (*)    All other components within normal limits  MAGNESIUM - Abnormal; Notable for the following components:   Magnesium 1.5 (*)    All other components within normal limits  RESP PANEL BY RT-PCR (FLU A&B, COVID) ARPGX2  HEPATIC FUNCTION PANEL  PROTIME-INR  TROPONIN I (HIGH SENSITIVITY)    EKG EKG Interpretation  Date/Time:  Saturday January 13 2020 13:30:19 EST Ventricular Rate:  110 PR Interval:    QRS Duration: 79 QT Interval:  347 QTC Calculation: 470 R Axis:   8 Text Interpretation: Sinus tachycardia Since last tracing rate faster Confirmed by Isla Pence [phone]) on 01/13/2020 3:32:42 PM   Radiology CT Angio Chest PE W and/or Wo Contrast  Result Date: 01/13/2020 CLINICAL DATA:  Shortness of breath. History of metastatic thyroid carcinoma EXAM: CT ANGIOGRAPHY CHEST WITH CONTRAST TECHNIQUE: Multidetector CT imaging of the chest was performed using the standard protocol during bolus administration of intravenous contrast. Multiplanar CT image reconstructions and MIPs were obtained to evaluate the vascular anatomy. CONTRAST:  69 mL OMNIPAQUE IOHEXOL 350 MG/ML SOLN COMPARISON:  Chest CT July 27, 2019; chest radiograph January 13, 2020 FINDINGS: Cardiovascular: There are multiple pulmonary emboli bilaterally. Pulmonary  embolus on the left arises from the distal main pulmonary outflow tract with multiple upper and lower lobe pulmonary emboli seen on the left. On the right, there are multiple lower lobe and to a lesser extent upper lobe pulmonary artery branches containing pulmonary emboli. No right main pulmonary artery emboli. The right ventricle to left ventricle diameter ratio is less than 0.9, not indicative of right heart strain. No thoracic aortic aneurysm. Visualized great vessels appear normal. No pericardial effusion or pericardial thickening evident. Mediastinum/Nodes: There is again noted fluid in the right lower neck region, less than on prior study. Apparent residual cystic area measures 4.1 x 2.3 cm. This fluid collection appears separate from the thyroid. No thyroid lesions are evident. Only a small amount of thyroid tissue is evident currently. There is no appreciable thoracic adenopathy. No esophageal lesions are appreciable. Lungs/Pleura: There are scattered areas of atelectatic change. There is no edema or consolidation. No pulmonary nodular lesions are appreciable. There is an azygos lobe on the right, an anatomic variant. There are no  pleural effusions. Upper Abdomen: There is ill-defined opacity in the anterior segment right lobe of the liver measuring 3.3 x 2.7 cm, a potential metastatic focus. Gallbladder is absent. Visualized upper abdominal structures appear normal. Musculoskeletal: There is degenerative change in the thoracic spine. No blastic or lytic bone lesions. No chest wall lesions are evident. Review of the MIP images confirms the above findings. IMPRESSION: 1. Multifocal pulmonary emboli, somewhat more on the left than on the right, without evident right heart strain. 2. Apparent metastasis in the right lobe of the liver, also present previously. 3. Fluid collection in the right lower neck region, smaller than on previous study and likely of postoperative etiology. 4. Scattered areas of  atelectasis. No pulmonary nodular lesions are consolidation. 5.  No adenopathy evident. 6.  Gallbladder absent. Critical Value/emergent results were called by telephone at the time of interpretation on 01/13/2020 at 3:02 pm to provider [facility] , who verbally acknowledged these results. Electronically Signed   By: Lowella Grip III M.D.   On: 01/13/2020 15:03   DG Chest Port 1 View  Result Date: 01/13/2020 CLINICAL DATA:  Shortness of breath.  Thyroid carcinoma EXAM: PORTABLE CHEST 1 VIEW COMPARISON:  November 12, 2018 chest radiograph; chest CT July 27, 2019. FINDINGS: There is slight atelectasis in the bases. The lungs elsewhere are clear. There is an azygos lobe on the right, an anatomic variant. Heart size and pulmonary vascularity are normal. Port-A-Cath tip is at the cavoatrial junction. No pneumothorax. No bone lesions. IMPRESSION: Mild bibasilar atelectasis. Lungs elsewhere clear. Cardiac silhouette normal. No adenopathy evident. Port-A-Cath tip cavoatrial junction. Electronically Signed   By: Lowella Grip III M.D.   On: 01/13/2020 13:22    Procedures Procedures (including critical care time)  Medications Ordered in ED Medications  sodium chloride 0.9 % bolus 1,000 mL (0 mLs Intravenous Stopped 01/13/20 1450)    And  0.9 %  sodium chloride infusion ( Intravenous New Bag/Given 01/13/20 1511)  magnesium sulfate IVPB 2 g 50 mL (2 g Intravenous New Bag/Given 01/13/20 1513)  heparin bolus via infusion 5,500 Units (has no administration in time range)  heparin ADULT infusion 100 units/mL (25000 units/263mL sodium chloride 0.45%) (has no administration in time range)  diphenhydrAMINE (BENADRYL) injection 25 mg (25 mg Intravenous Given 01/13/20 1327)  methylPREDNISolone sodium succinate (SOLU-MEDROL) 125 mg/2 mL injection 125 mg (125 mg Intravenous Given 01/13/20 1329)  famotidine (PEPCID) IVPB 20 mg premix (0 mg Intravenous Stopped 01/13/20 1402)  HYDROmorphone (DILAUDID)  injection 1 mg (1 mg Intravenous Given 01/13/20 1415)  promethazine (PHENERGAN) injection 12.5 mg (12.5 mg Intravenous Given 01/13/20 1445)  iohexol (OMNIPAQUE) 350 MG/ML injection 100 mL (69 mLs Intravenous Contrast Given 01/13/20 1428)  hydrocortisone cream 1 % ( Topical Given 01/13/20 1414)    ED Course  I have reviewed the triage vital signs and the nursing notes.  Pertinent labs & imaging results that were available during my care of the patient were reviewed by me and considered in my medical decision making (see chart for details).    MDM Rules/Calculators/A&P                          Pt given benadryl/pepcid/solumedrol and rash is improving.  He was given hydrocortisone cream, but said that felt like fire to his face.    Pt's K is nl today.  Mg is low, so he is given 2 g IV.  HR is better after fluids.  Unfortunately,  his CTA showed bilateral PE.  He will be started on heparin per pharmacy consult.  Pt's PESI score is 104 which is intermediate, so he has a higher risk of mortality.  He was d/w (Dr. Sloan Leiter) hospitalist for admission.  CRITICAL CARE Performed by: Isla Pence   Total critical care time: 30 minutes  Critical care time was exclusive of separately billable procedures and treating other patients.  Critical care was necessary to treat or prevent imminent or life-threatening deterioration.  Critical care was time spent personally by me on the following activities: development of treatment plan with patient and/or surrogate as well as nursing, discussions with consultants, evaluation of patient's response to treatment, examination of patient, obtaining history from patient or surrogate, ordering and performing treatments and interventions, ordering and review of laboratory studies, ordering and review of radiographic studies, pulse oximetry and re-evaluation of patient's condition.  Final Clinical Impression(s) / ED Diagnoses Final diagnoses:  Allergic  reaction, initial encounter  Hypomagnesemia  Pulmonary embolism, bilateral ([place])    Rx / DC Orders ED Discharge Orders    None       Isla Pence, MD 01/13/20 1539

## 2020-01-13 NOTE — ED Triage Notes (Signed)
Complaining of rash starting yesterday to face, neck and bilateral arms. Burning and itching. States has been increasingly SOB/diaphoretic with exertion. Chemo earlier this week with new regimen. No other new foods/items.

## 2020-01-13 NOTE — Progress Notes (Signed)
ANTICOAGULATION CONSULT NOTE - Initial Consult  Pharmacy Consult for heparin Indication: pulmonary embolus  Allergies  Allergen Reactions  . Dextromethorphan-Guaifenesin Other (See Comments)    Irregular heartbeat   . Doxycycline Anaphylaxis, Nausea And Vomiting, Rash and Other (See Comments)    "heart arrythmia" and "dyspepsia" (only oral doxycycline causes reaction)  . Gadobutrol Hives and Other (See Comments)    Patient had MRI scan at [place]. Patient called one hour after he left imaging facility to report two "blisters" that came up on "back" of lip.    . Gadolinium Derivatives Hives    Patient had MRI scan at [place]. Patient called one hour after he left imaging facility to report two "blisters" that came up on "back" of lip.   . Guaifenesin Palpitations       . Ibuprofen Hives and Itching  . Lisinopril Other (See Comments)    Angioedema  . Pantoprazole Itching  . Tramadol Hives and Itching  . Barium Rash    Developed redness around neck after drinking 1st bottle of Barocat; pt was given Benedryl by ED Mds to be "on the safe side" before drinking 2nd bottle    Patient Measurements: Height: 5\' 10"  (177.8 cm) Weight: 130 kg (286 lb 9.6 oz) IBW/kg (Calculated) : 73 Heparin Dosing Weight: 102.9kg  Vital Signs: Temp: 98.5 F (36.9 C) (11/27 1300) Temp Source: Oral (11/27 1300) BP: 156/99 (11/27 1400) Pulse Rate: 104 (11/27 1400)  Labs: Recent Labs    01/13/20 1332  HGB 11.2*  HCT 34.0*  PLT 246  CREATININE 1.19    Estimated Creatinine Clearance: 107.3 mL/min (by C-G formula based on SCr of 1.19 mg/dL).   Medical History: Past Medical History:  Diagnosis Date  . Essential hypertension 09/09/2018  . Kidney stone   . Primary cancer of thyroid with metastasis to other site ([place]) 05/04/2018  . Rickettsia infection     Medications:  Infusions:  . sodium chloride 125 mL/hr at 01/13/20 1511  . heparin    . magnesium sulfate bolus IVPB  2 g (01/13/20 1513)    Assessment: 36 yom presented to the ED with rash and SOB. Found to have a PE and now starting IV heparin. Baseline Hgb is low at 11.2 and platelets are WNL. He is not on anticoagulation PTA.   Goal of Therapy:  Heparin level 0.3-0.7 units/ml Monitor platelets by anticoagulation protocol: Yes   Plan:  Heparin bolus 5500 units IV x 1 Heparin gtt 1600 units/hr Check a 6 hr heparin level Daily heparin level and CBC  Oleda Borski, [name] 01/13/2020,3:21 PM

## 2020-01-14 ENCOUNTER — Other Ambulatory Visit (HOSPITAL_BASED_OUTPATIENT_CLINIC_OR_DEPARTMENT_OTHER): Payer: Self-pay | Admitting: Internal Medicine

## 2020-01-14 ENCOUNTER — Encounter (HOSPITAL_COMMUNITY): Payer: Self-pay | Admitting: Internal Medicine

## 2020-01-14 ENCOUNTER — Observation Stay (HOSPITAL_COMMUNITY): Payer: 59

## 2020-01-14 DIAGNOSIS — M7989 Other specified soft tissue disorders: Secondary | ICD-10-CM

## 2020-01-14 DIAGNOSIS — Z7901 Long term (current) use of anticoagulants: Secondary | ICD-10-CM

## 2020-01-14 DIAGNOSIS — R21 Rash and other nonspecific skin eruption: Secondary | ICD-10-CM | POA: Diagnosis present

## 2020-01-14 DIAGNOSIS — Z20822 Contact with and (suspected) exposure to covid-19: Secondary | ICD-10-CM | POA: Diagnosis present

## 2020-01-14 DIAGNOSIS — Z833 Family history of diabetes mellitus: Secondary | ICD-10-CM | POA: Diagnosis not present

## 2020-01-14 DIAGNOSIS — Z7189 Other specified counseling: Secondary | ICD-10-CM

## 2020-01-14 DIAGNOSIS — I2699 Other pulmonary embolism without acute cor pulmonale: Secondary | ICD-10-CM | POA: Diagnosis not present

## 2020-01-14 DIAGNOSIS — Z79899 Other long term (current) drug therapy: Secondary | ICD-10-CM

## 2020-01-14 DIAGNOSIS — Z83438 Family history of other disorder of lipoprotein metabolism and other lipidemia: Secondary | ICD-10-CM | POA: Diagnosis not present

## 2020-01-14 DIAGNOSIS — I82402 Acute embolism and thrombosis of unspecified deep veins of left lower extremity: Secondary | ICD-10-CM

## 2020-01-14 DIAGNOSIS — R110 Nausea: Secondary | ICD-10-CM | POA: Diagnosis present

## 2020-01-14 DIAGNOSIS — G894 Chronic pain syndrome: Secondary | ICD-10-CM | POA: Diagnosis present

## 2020-01-14 DIAGNOSIS — I824Z2 Acute embolism and thrombosis of unspecified deep veins of left distal lower extremity: Secondary | ICD-10-CM | POA: Diagnosis not present

## 2020-01-14 DIAGNOSIS — C787 Secondary malignant neoplasm of liver and intrahepatic bile duct: Secondary | ICD-10-CM | POA: Diagnosis not present

## 2020-01-14 DIAGNOSIS — Z8249 Family history of ischemic heart disease and other diseases of the circulatory system: Secondary | ICD-10-CM | POA: Diagnosis not present

## 2020-01-14 DIAGNOSIS — Z6841 Body Mass Index (BMI) 40.0 and over, adult: Secondary | ICD-10-CM | POA: Diagnosis not present

## 2020-01-14 DIAGNOSIS — R000 Tachycardia, unspecified: Secondary | ICD-10-CM | POA: Diagnosis present

## 2020-01-14 DIAGNOSIS — I82432 Acute embolism and thrombosis of left popliteal vein: Secondary | ICD-10-CM | POA: Diagnosis present

## 2020-01-14 DIAGNOSIS — I2692 Saddle embolus of pulmonary artery without acute cor pulmonale: Secondary | ICD-10-CM | POA: Diagnosis not present

## 2020-01-14 DIAGNOSIS — I10 Essential (primary) hypertension: Secondary | ICD-10-CM | POA: Diagnosis present

## 2020-01-14 DIAGNOSIS — T451X5A Adverse effect of antineoplastic and immunosuppressive drugs, initial encounter: Secondary | ICD-10-CM | POA: Diagnosis present

## 2020-01-14 DIAGNOSIS — Z79890 Hormone replacement therapy (postmenopausal): Secondary | ICD-10-CM | POA: Diagnosis not present

## 2020-01-14 DIAGNOSIS — Z85850 Personal history of malignant neoplasm of thyroid: Secondary | ICD-10-CM | POA: Diagnosis not present

## 2020-01-14 DIAGNOSIS — C73 Malignant neoplasm of thyroid gland: Secondary | ICD-10-CM | POA: Diagnosis not present

## 2020-01-14 DIAGNOSIS — Z886 Allergy status to analgesic agent status: Secondary | ICD-10-CM | POA: Diagnosis not present

## 2020-01-14 DIAGNOSIS — E890 Postprocedural hypothyroidism: Secondary | ICD-10-CM | POA: Diagnosis present

## 2020-01-14 DIAGNOSIS — Z888 Allergy status to other drugs, medicaments and biological substances status: Secondary | ICD-10-CM | POA: Diagnosis not present

## 2020-01-14 DIAGNOSIS — K219 Gastro-esophageal reflux disease without esophagitis: Secondary | ICD-10-CM | POA: Diagnosis present

## 2020-01-14 HISTORY — DX: Acute embolism and thrombosis of unspecified deep veins of left distal lower extremity: I82.4Z2

## 2020-01-14 HISTORY — DX: Other specified counseling: Z71.89

## 2020-01-14 LAB — CBC
HCT: 27.3 % — ABNORMAL LOW (ref 39.0–52.0)
Hemoglobin: 9.1 g/dL — ABNORMAL LOW (ref 13.0–17.0)
MCH: 33.8 pg (ref 26.0–34.0)
MCHC: 33.3 g/dL (ref 30.0–36.0)
MCV: 101.5 fL — ABNORMAL HIGH (ref 80.0–100.0)
Platelets: 208 10*3/uL (ref 150–400)
RBC: 2.69 MIL/uL — ABNORMAL LOW (ref 4.22–5.81)
RDW: 15.4 % (ref 11.5–15.5)
WBC: 2.3 10*3/uL — ABNORMAL LOW (ref 4.0–10.5)
nRBC: 0 % (ref 0.0–0.2)

## 2020-01-14 LAB — COMPREHENSIVE METABOLIC PANEL
ALT: 18 U/L (ref 0–44)
AST: 18 U/L (ref 15–41)
Albumin: 3.4 g/dL — ABNORMAL LOW (ref 3.5–5.0)
Alkaline Phosphatase: 66 U/L (ref 38–126)
Anion gap: 5 (ref 5–15)
BUN: 22 mg/dL — ABNORMAL HIGH (ref 6–20)
CO2: 27 mmol/L (ref 22–32)
Calcium: 8.4 mg/dL — ABNORMAL LOW (ref 8.9–10.3)
Chloride: 99 mmol/L (ref 98–111)
Creatinine, Ser: 0.97 mg/dL (ref 0.61–1.24)
GFR, Estimated: >60 mL/min (ref >60)
Glucose, Bld: 184 mg/dL — ABNORMAL HIGH (ref 70–99)
Potassium: 4.1 mmol/L (ref 3.5–5.1)
Sodium: 131 mmol/L — ABNORMAL LOW (ref 135–145)
Total Bilirubin: 0.5 mg/dL (ref 0.3–1.2)
Total Protein: 5.4 g/dL — ABNORMAL LOW (ref 6.5–8.1)

## 2020-01-14 LAB — HEPARIN LEVEL (UNFRACTIONATED)
Heparin Unfractionated: 0.32 IU/mL (ref 0.30–0.70)
Heparin Unfractionated: 0.26 IU/mL — ABNORMAL LOW (ref 0.30–0.70)

## 2020-01-14 LAB — MAGNESIUM: Magnesium: 1.9 mg/dL (ref 1.7–2.4)

## 2020-01-14 LAB — PHOSPHORUS: Phosphorus: 2.4 mg/dL — ABNORMAL LOW (ref 2.5–4.6)

## 2020-01-14 LAB — HIV ANTIBODY (ROUTINE TESTING W REFLEX)
HIV Screen 4th Generation wRfx: NONREACTIVE
HIV Screen 4th Generation wRfx: NONREACTIVE

## 2020-01-14 MED ORDER — POLYETHYLENE GLYCOL 3350 17 G PO PACK
17.0000 g | PACK | Freq: Every day | ORAL | Status: DC
  Administered 2020-01-14 – 2020-01-15 (×2): 17 g via ORAL
  Filled 2020-01-14 (×2): qty 1

## 2020-01-14 MED ORDER — ENOXAPARIN SODIUM 120 MG/0.8ML ~~LOC~~ SOLN
120.0000 mg | Freq: Two times a day (BID) | SUBCUTANEOUS | Status: DC
  Administered 2020-01-14 – 2020-01-15 (×3): 120 mg via SUBCUTANEOUS
  Filled 2020-01-14 (×4): qty 0.8

## 2020-01-14 MED ORDER — ENOXAPARIN SODIUM 120 MG/0.8ML ~~LOC~~ SOLN: 120.0000 mg | Freq: Two times a day (BID) | SUBCUTANEOUS | 0 refills | Status: DC

## 2020-01-14 MED ORDER — TRAZODONE HCL 50 MG PO TABS
50.0000 mg | ORAL_TABLET | Freq: Every evening | ORAL | Status: DC | PRN
  Administered 2020-01-14: 50 mg via ORAL
  Filled 2020-01-14: qty 1

## 2020-01-14 NOTE — Progress Notes (Signed)
Patient ambulated twice 90 feet, activity tolerated moderately well. Pt reports having increased SOB with ambulation. Oxygen saturation maintained 94%. Will continue to monitor.

## 2020-01-14 NOTE — Plan of Care (Signed)
  Problem: Education: Goal: Knowledge of General Education information will improve Description: Including pain rating scale, medication(s)/side effects and non-pharmacologic comfort measures Outcome: Progressing   Problem: Nutrition: Goal: Adequate nutrition will be maintained Outcome: Progressing   Problem: Coping: Goal: Level of anxiety will decrease Outcome: Progressing   Problem: Elimination: Goal: Will not experience complications related to bowel motility Outcome: Progressing   Problem: Pain Managment: Goal: General experience of comfort will improve Outcome: Progressing   Problem: Safety: Goal: Ability to remain free from injury will improve Outcome: Progressing

## 2020-01-14 NOTE — Progress Notes (Signed)
[place] for heparin Indication: pulmonary embolus  Allergies  Allergen Reactions  . Dextromethorphan-Guaifenesin Other (See Comments)    Irregular heartbeat   . Doxycycline Anaphylaxis, Nausea And Vomiting, Rash and Other (See Comments)    "heart arrythmia" and "dyspepsia" (only oral doxycycline causes reaction)  . Gadobutrol Hives and Other (See Comments)    Patient had MRI scan at [place]. Patient called one hour after he left imaging facility to report two "blisters" that came up on "back" of lip.    . Gadolinium Derivatives Hives    Patient had MRI scan at [place]. Patient called one hour after he left imaging facility to report two "blisters" that came up on "back" of lip.   . Guaifenesin Palpitations       . Ibuprofen Hives and Itching  . Lisinopril Other (See Comments)    Angioedema  . Pantoprazole Itching  . Tramadol Hives and Itching  . Barium Rash    Developed redness around neck after drinking 1st bottle of Barocat; pt was given Benedryl by ED Mds to be "on the safe side" before drinking 2nd bottle    Patient Measurements: Height: 5\' 10"  (177.8 cm) Weight: 122.5 kg (270 lb) IBW/kg (Calculated) : 73 Heparin Dosing Weight: 102.9kg  Vital Signs: Temp: 97.8 F (36.6 C) (11/27 2122) Temp Source: Oral (11/27 2122) BP: 143/104 (11/27 2122) Pulse Rate: 96 (11/27 2122)  Labs: Recent Labs    01/13/20 1332 01/13/20 1836 01/13/20 2200  HGB 11.2*  --   --   HCT 34.0*  --   --   PLT 246  --   --   LABPROT 12.8  --   --   INR 1.0  --   --   HEPARINUNFRC  --   --  0.32  CREATININE 1.19  --   --   TROPONINIHS 6 3  --     Estimated Creatinine Clearance: 104 mL/min (by C-G formula based on SCr of 1.19 mg/dL).   Medical History: Past Medical History:  Diagnosis Date  . Essential hypertension 09/09/2018  . Kidney stone   . Primary cancer of thyroid with metastasis to other site ([place]) 05/04/2018  .  Rickettsia infection     Medications:  Infusions:  . sodium chloride 125 mL/hr at 01/13/20 1511  . sodium chloride    . heparin 1,600 Units/hr (01/13/20 1601)    Assessment: 38 yom presented to the ED with rash and SOB. Found to have a PE and now starting IV heparin. Baseline Hgb is low at 11.2 and platelets are WNL. He is not on anticoagulation PTA.    Heparin level = 0.32 (therapeutic) with heparin gtt @ 1600 units/hr  No complications of therapy noted  Goal of Therapy:  Heparin level 0.3-0.7 units/ml Monitor platelets by anticoagulation protocol: Yes   Plan:  Continue Heparin gtt 1600 units/hr Recheck heparin level in 6 hr to confirm therapeutic dose  Daily heparin level and CBC  Cassadi Purdie, Toribio Harbour, PharmD 01/14/2020,12:52 AM

## 2020-01-14 NOTE — Progress Notes (Signed)
Lower extremity venous bilateral study completed.  Preliminary results relayed to MD.  See CV Proc for preliminary results report.   Darlin Coco, RDMS

## 2020-01-14 NOTE — Progress Notes (Signed)
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  He was found to have bilateral pulmonary embolism without cor pulmonale.   Assessment & Plan:   Principal Problem:   Pulmonary embolism without acute cor pulmonale (HCC) Active Problems:   Gastroesophageal reflux disease without esophagitis   Primary cancer of thyroid with metastasis to other site [facility])   Essential hypertension  Pulmonary embolism without cor pulmonale, bilateral pulmonary embolism.  Left popliteal DVT.  Active cancer. On heparin drip overnight. Will change to Lovenox injection 120 unit twice a day, patient to get educated about taking Lovenox at home. With his active cancer, Lovenox his drug of choice. Tolerating so far.  Has scant fresh blood on his stool after passing a hard stool that is probably outlet bleeding. Laxatives and stool softener. Advance activities and monitor.  GERD: On PPI continue.  Metastatic thyroid cancer: On chemotherapy.  Followed by oncology.  Hypertension: Continue clonidine.  Skin rashes: Good improvement with prednisone and Benadryl.  Probably related to chemotherapy.  Hypomagnesemia: Replaced and improved.   DVT prophylaxis: Lovenox subcu   Code Status: Full code Family Communication: Father at the bedside.  Wife on the phone. Disposition Plan: Status is: Inpatient  Remains inpatient appropriate because:Inpatient level of care appropriate due to severity of illness   Dispo: The patient is from: Home              Anticipated d/c is to: Home              Anticipated d/c date is: 2 days              Patient currently is not medically stable to d/c.         Consultants:   Oncology  Procedures:    None  Antimicrobials:   None   Subjective: Patient seen and examined.  No overnight events.  At rest he feels fairly comfortable.  On mobility, he feels short of breath and tired.  He will start taking Lovenox and learn to do it.  Objective: Vitals:   01/13/20 2122 01/14/20 0206 01/14/20 0534 01/14/20 1013  BP: (!) 143/104 (!) 141/97 121/90 (!) 147/102  Pulse: 96 86 84 (!) 106  Resp: 17 17 16 20   Temp: 97.8 F (36.6 C) 97.6 F (36.4 C) (!) 97.3 F (36.3 C) 98.6 F (37 C)  TempSrc: Oral  Oral Oral  SpO2: 97% 95% 95% 97%  Weight:      Height:        Intake/Output Summary (Last 24 hours) at 01/14/2020 1404 Last data filed at 01/14/2020 1020 Gross per 24 hour  Intake 1365.5 ml  Output 1900 ml  Net -534.5 ml   Filed Weights   01/13/20 1301 01/13/20 1730  Weight: 130 kg 122.5 kg    Examination:  General exam: Appears calm and comfortable at rest.  Slightly anxious. Respiratory system: Clear to auscultation. Respiratory effort normal. Cardiovascular system: S1 & S2 heard, tachycardic. Has a port on his left chest wall. Gastrointestinal system: Abdomen is nondistended, soft and nontender. No organomegaly or masses felt. Normal bowel sounds heard. Central nervous system: Alert and oriented. No focal neurological deficits. Extremities: Symmetric 5 x 5 power. Skin: No rashes, lesions or ulcers Psychiatry: Judgement and  insight appear normal. Mood & affect appropriate.     Data Reviewed: I have personally reviewed following labs and imaging studies  CBC: Recent Labs  Lab 01/09/20 1335 01/13/20 1332 01/14/20 0344  WBC 4.2 4.3 2.3*  NEUTROABS 2.9 3.5  --   HGB 10.4* 11.2* 9.1*  HCT 32.1* 34.0* 27.3*  MCV 102.9* 102.4* 101.5*  PLT 206 246 588   Basic Metabolic Panel: Recent Labs  Lab 01/09/20 1335 01/13/20 1332 01/14/20 0344  NA 139 135 131*  K 2.9* 3.8 4.1  CL 94* 97* 99  CO2 35* 27 27  GLUCOSE 115* 103* 184*  BUN 24* 23* 22*  CREATININE 1.38*  1.19 0.97  CALCIUM 9.3 9.1 8.4*  MG  --  1.5* 1.9  PHOS  --   --  2.4*   GFR: Estimated Creatinine Clearance: 127.6 mL/min (by C-G formula based on SCr of 0.97 mg/dL). Liver Function Tests: Recent Labs  Lab 01/09/20 1335 01/13/20 1332 01/14/20 0344  AST 25 26 18   ALT 26 23 18   ALKPHOS 83 79 66  BILITOT 0.6 1.3* 0.5  PROT 6.5 6.6 5.4*  ALBUMIN 4.1 4.0 3.4*   No results for input(s): LIPASE, AMYLASE in the last 168 hours. No results for input(s): AMMONIA in the last 168 hours. Coagulation Profile: Recent Labs  Lab 01/13/20 1332  INR 1.0   Cardiac Enzymes: No results for input(s): CKTOTAL, CKMB, CKMBINDEX, TROPONINI in the last 168 hours. BNP (last 3 results) No results for input(s): PROBNP in the last 8760 hours. HbA1C: No results for input(s): HGBA1C in the last 72 hours. CBG: No results for input(s): GLUCAP in the last 168 hours. Lipid Profile: No results for input(s): CHOL, HDL, LDLCALC, TRIG, CHOLHDL, LDLDIRECT in the last 72 hours. Thyroid Function Tests: No results for input(s): TSH, T4TOTAL, FREET4, T3FREE, THYROIDAB in the last 72 hours. Anemia Panel: No results for input(s): VITAMINB12, FOLATE, FERRITIN, TIBC, IRON, RETICCTPCT in the last 72 hours. Sepsis Labs: No results for input(s): PROCALCITON, LATICACIDVEN in the last 168 hours.  Recent Results (from the past 240 hour(s))  Resp Panel by RT-PCR (Flu A&B, Covid) Nasopharyngeal Swab     Status: None   Collection Time: 01/13/20  3:42 PM   Specimen: Nasopharyngeal Swab; Nasopharyngeal(NP) swabs in vial transport medium  Result Value Ref Range Status   SARS Coronavirus 2 by RT PCR NEGATIVE NEGATIVE Final    Comment: (NOTE) SARS-CoV-2 target nucleic acids are NOT DETECTED.  The SARS-CoV-2 RNA is generally detectable in upper respiratory specimens during the acute phase of infection. The lowest concentration of SARS-CoV-2 viral copies this assay can detect is 138 copies/mL. A negative result does not  preclude SARS-Cov-2 infection and should not be used as the sole basis for treatment or other patient management decisions. A negative result may occur with  improper specimen collection/handling, submission of specimen other than nasopharyngeal swab, presence of viral mutation(s) within the areas targeted by this assay, and inadequate number of viral copies(<138 copies/mL). A negative result must be combined with clinical observations, patient history, and epidemiological information. The expected result is Negative.  Fact Sheet for Patients:  EntrepreneurPulse.com.au  Fact Sheet for Healthcare Providers:  IncredibleEmployment.be  This test is no t yet approved or cleared by the [place] FDA and  has been authorized for detection and/or diagnosis of SARS-CoV-2 by FDA under an Emergency Use Authorization (EUA). This EUA will remain  in effect (meaning this test can be used) for the duration of the COVID-19 declaration  under Section 564(b)(1) of the Act, 21 U.S.C.section 360bbb-3(b)(1), unless the authorization is terminated  or revoked sooner.       Influenza A by PCR NEGATIVE NEGATIVE Final   Influenza B by PCR NEGATIVE NEGATIVE Final    Comment: (NOTE) The Xpert Xpress SARS-CoV-2/FLU/RSV plus assay is intended as an aid in the diagnosis of influenza from Nasopharyngeal swab specimens and should not be used as a sole basis for treatment. Nasal washings and aspirates are unacceptable for Xpert Xpress SARS-CoV-2/FLU/RSV testing.  Fact Sheet for Patients: EntrepreneurPulse.com.au  Fact Sheet for Healthcare Providers: IncredibleEmployment.be  This test is not yet approved or cleared by the [place] FDA and has been authorized for detection and/or diagnosis of SARS-CoV-2 by FDA under an Emergency Use Authorization (EUA). This EUA will remain in effect (meaning this test can be used) for the  duration of the COVID-19 declaration under Section 564(b)(1) of the Act, 21 U.S.C. section 360bbb-3(b)(1), unless the authorization is terminated or revoked.  Performed at [facility], [address], [place], [place] [address]          Radiology Studies: CT Angio Chest PE W and/or Wo Contrast  Result Date: 01/13/2020 CLINICAL DATA:  Shortness of breath. History of metastatic thyroid carcinoma EXAM: CT ANGIOGRAPHY CHEST WITH CONTRAST TECHNIQUE: Multidetector CT imaging of the chest was performed using the standard protocol during bolus administration of intravenous contrast. Multiplanar CT image reconstructions and MIPs were obtained to evaluate the vascular anatomy. CONTRAST:  69 mL OMNIPAQUE IOHEXOL 350 MG/ML SOLN COMPARISON:  Chest CT July 27, 2019; chest radiograph January 13, 2020 FINDINGS: Cardiovascular: There are multiple pulmonary emboli bilaterally. Pulmonary embolus on the left arises from the distal main pulmonary outflow tract with multiple upper and lower lobe pulmonary emboli seen on the left. On the right, there are multiple lower lobe and to a lesser extent upper lobe pulmonary artery branches containing pulmonary emboli. No right main pulmonary artery emboli. The right ventricle to left ventricle diameter ratio is less than 0.9, not indicative of right heart strain. No thoracic aortic aneurysm. Visualized great vessels appear normal. No pericardial effusion or pericardial thickening evident. Mediastinum/Nodes: There is again noted fluid in the right lower neck region, less than on prior study. Apparent residual cystic area measures 4.1 x 2.3 cm. This fluid collection appears separate from the thyroid. No thyroid lesions are evident. Only a small amount of thyroid tissue is evident currently. There is no appreciable thoracic adenopathy. No esophageal lesions are appreciable. Lungs/Pleura: There are scattered areas of atelectatic change. There is no edema or  consolidation. No pulmonary nodular lesions are appreciable. There is an azygos lobe on the right, an anatomic variant. There are no pleural effusions. Upper Abdomen: There is ill-defined opacity in the anterior segment right lobe of the liver measuring 3.3 x 2.7 cm, a potential metastatic focus. Gallbladder is absent. Visualized upper abdominal structures appear normal. Musculoskeletal: There is degenerative change in the thoracic spine. No blastic or lytic bone lesions. No chest wall lesions are evident. Review of the MIP images confirms the above findings. IMPRESSION: 1. Multifocal pulmonary emboli, somewhat more on the left than on the right, without evident right heart strain. 2. Apparent metastasis in the right lobe of the liver, also present previously. 3. Fluid collection in the right lower neck region, smaller than on previous study and likely of postoperative etiology. 4. Scattered areas of atelectasis. No pulmonary nodular lesions are consolidation. 5.  No adenopathy evident. 6.  Gallbladder absent. Critical Value/emergent results were called by telephone at the time of interpretation on 01/13/2020 at 3:02 pm to provider [facility] , who verbally acknowledged these results. Electronically Signed   By: Lowella Grip III M.D.   On: 01/13/2020 15:03   DG Chest Port 1 View  Result Date: 01/13/2020 CLINICAL DATA:  Shortness of breath.  Thyroid carcinoma EXAM: PORTABLE CHEST 1 VIEW COMPARISON:  November 12, 2018 chest radiograph; chest CT July 27, 2019. FINDINGS: There is slight atelectasis in the bases. The lungs elsewhere are clear. There is an azygos lobe on the right, an anatomic variant. Heart size and pulmonary vascularity are normal. Port-A-Cath tip is at the cavoatrial junction. No pneumothorax. No bone lesions. IMPRESSION: Mild bibasilar atelectasis. Lungs elsewhere clear. Cardiac silhouette normal. No adenopathy evident. Port-A-Cath tip cavoatrial junction. Electronically Signed   By:  Lowella Grip III M.D.   On: 01/13/2020 13:22   VAS [place] LOWER EXTREMITY VENOUS (DVT)  Result Date: 01/14/2020  Lower Venous DVT Study Indications: Pulmonary embolism.  Risk Factors: Cancer Primary cancer of thryoid with metastasis. Limitations: Poor ultrasound/tissue interface. Comparison Study: No prior studies. Performing Technologist: Darlin Coco, RDMS  Examination Guidelines: A complete evaluation includes B-mode imaging, spectral Doppler, color Doppler, and power Doppler as needed of all accessible portions of each vessel. Bilateral testing is considered an integral part of a complete examination. Limited examinations for reoccurring indications may be performed as noted. The reflux portion of the exam is performed with the patient in reverse Trendelenburg.  +---------+---------------+---------+-----------+----------+---------------+ RIGHT    CompressibilityPhasicitySpontaneityPropertiesThrombus Aging  +---------+---------------+---------+-----------+----------+---------------+ CFV      Full           Yes      Yes                                  +---------+---------------+---------+-----------+----------+---------------+ SFJ      Full                                                         +---------+---------------+---------+-----------+----------+---------------+ FV Prox  Full                                                         +---------+---------------+---------+-----------+----------+---------------+ FV Mid   Full                                                         +---------+---------------+---------+-----------+----------+---------------+ FV DistalFull                                                         +---------+---------------+---------+-----------+----------+---------------+ PFV      Full                                                         +---------+---------------+---------+-----------+----------+---------------+  POP       Full           Yes      Yes                                  +---------+---------------+---------+-----------+----------+---------------+ PTV      Full                                                         +---------+---------------+---------+-----------+----------+---------------+ PERO                                                  Patent by color +---------+---------------+---------+-----------+----------+---------------+   +---------+---------------+---------+-----------+----------+-------------------+ LEFT     CompressibilityPhasicitySpontaneityPropertiesThrombus Aging      +---------+---------------+---------+-----------+----------+-------------------+ CFV      Full           Yes      Yes                                      +---------+---------------+---------+-----------+----------+-------------------+ SFJ      Full                                                             +---------+---------------+---------+-----------+----------+-------------------+ FV Prox  Full                                                             +---------+---------------+---------+-----------+----------+-------------------+ FV Mid   Full                                                             +---------+---------------+---------+-----------+----------+-------------------+ FV DistalFull                                                             +---------+---------------+---------+-----------+----------+-------------------+ PFV      Full                                                             +---------+---------------+---------+-----------+----------+-------------------+ POP      None  No       No                   Acute               +---------+---------------+---------+-----------+----------+-------------------+ PTV                                                   Not well visualized  +---------+---------------+---------+-----------+----------+-------------------+ PERO                                                  Not well visualized +---------+---------------+---------+-----------+----------+-------------------+     Summary: RIGHT: - There is no evidence of deep vein thrombosis in the lower extremity.  - No cystic structure found in the popliteal fossa.  LEFT: - Findings consistent with acute deep vein thrombosis involving the left popliteal vein. - No cystic structure found in the popliteal fossa.  *See table(s) above for measurements and observations.    Preliminary         Scheduled Meds: . Chlorhexidine Gluconate Cloth  6 each Topical Daily  . cloNIDine  0.1 mg Oral BID  . docusate sodium  100 mg Oral BID  . enoxaparin (LOVENOX) injection  120 mg Subcutaneous Q12H  . furosemide  80 mg Oral Daily  . gabapentin  300 mg Oral TID  . levothyroxine  200 mcg Oral Q0600  . levothyroxine  88 mcg Oral Q0600  . methylphenidate  10 mg Oral BID  . metolazone  5 mg Oral q1800  . multivitamin with minerals  1 tablet Oral Daily  . polyethylene glycol  17 g Oral Daily  . potassium chloride  10 mEq Oral Daily  . predniSONE  50 mg Oral Q breakfast  . sodium chloride flush  10-40 mL Intracatheter Q12H  . sodium chloride flush  3 mL Intravenous Q12H   Continuous Infusions: . sodium chloride       LOS: 0 days    Time spent: 30 minutes    Barb Merino, MD [facility] Pager [phone]

## 2020-01-14 NOTE — Consult Note (Signed)
Referral MD  Reason for Referral: Bilateral pulmonary emboli; metastatic thyroid cancer  Chief Complaint  Patient presents with  . Rash  . Shortness of Breath  : I had a rash my face.  HPI: [name] is well-known to me.  Is a very nice 44 year old white male.  He used to work in the emergency room at our office.  He has metastatic thyroid cancer.  He has been on different courses of therapy.  He has hepatic metastasis.  He has been receiving single agent Adriamycin.  His last cycle was given on the 23rd.  He said that on Thanksgiving, he was not feeling all that well.  He did not have any obvious shortness of breath.  He had no chest wall pain.  Some chronic pain in the right upper abdomen where he has metastatic disease.  He then developed a rash on his face.  He thought this might have been related to a face mask.  He subsequently went to the emergency room at [facility].  The doctor was incredibly astute and ordered a CT angiogram on him.  Surprisingly, this showed bilateral pulmonary emboli.  There is more on the left than on the right.  There is no right heart strain.  He had a liver lesion which was known before.  He is on heparin right now.  So far, no Dopplers were done of his legs.  He feels okay.  He has had no cough.  No chest wall pain.  He has had no obvious shortness of breath.  He has some chronic leg swelling.  He has been putting out protein in his urine but this is much better now.  He clearly is going to need outpatient anticoagulation.  I would favor Lovenox for right now given the pulmonary emboli.    Past Medical History:  Diagnosis Date  . Essential hypertension 09/09/2018  . Kidney stone   . Primary cancer of thyroid with metastasis to other site ([place]) 05/04/2018  . Rickettsia infection   :  Past Surgical History:  Procedure Laterality Date  . BIOPSY  04/05/2018   Procedure: BIOPSY;  Surgeon: Ronald Lobo, MD;  Location: [facility];   Service: Endoscopy;;  . CHOLECYSTECTOMY    . ESOPHAGOGASTRODUODENOSCOPY (EGD) WITH PROPOFOL N/A 04/05/2018   Procedure: ESOPHAGOGASTRODUODENOSCOPY (EGD) WITH PROPOFOL;  Surgeon: Ronald Lobo, MD;  Location: [facility];  Service: Endoscopy;  Laterality: N/A;  . IR IMAGING GUIDED PORT INSERTION  09/13/2019  . IR RADIOLOGIST EVAL & MGMT  10/11/2018  :   Current Facility-Administered Medications:  .  [COMPLETED] sodium chloride 0.9 % bolus 1,000 mL, 1,000 mL, Intravenous, Once, Stopped at 01/13/20 1450 **AND** 0.9 %  sodium chloride infusion, , Intravenous, Continuous, [name], MD, Last Rate: 125 mL/hr at 01/14/20 0738, New Bag at 01/14/20 0738 .  0.9 %  sodium chloride infusion, 250 mL, Intravenous, PRN, Barb Merino, MD .  acetaminophen (TYLENOL) tablet 650 mg, 650 mg, Oral, Q6H PRN **OR** acetaminophen (TYLENOL) suppository 650 mg, 650 mg, Rectal, Q6H PRN, Barb Merino, MD .  Chlorhexidine Gluconate Cloth 2 % PADS 6 each, 6 each, Topical, Daily, Ghimire, Kuber, MD .  cloNIDine (CATAPRES) tablet 0.1 mg, 0.1 mg, Oral, BID, Barb Merino, MD, 0.1 mg at 01/13/20 2141 .  cyclobenzaprine (FLEXERIL) tablet 10 mg, 10 mg, Oral, TID PRN, Barb Merino, MD .  diphenhydrAMINE (BENADRYL) capsule 25 mg, 25 mg, Oral, Q4H PRN, Barb Merino, MD .  docusate sodium (COLACE) capsule 100 mg, 100  mg, Oral, BID, Barb Merino, MD, 100 mg at 01/13/20 2141 .  enoxaparin (LOVENOX) injection 120 mg, 120 mg, Subcutaneous, Q12H, Magaly Pollina, Rudell Cobb, MD .  furosemide (LASIX) tablet 80 mg, 80 mg, Oral, Daily, Ghimire, Kuber, MD .  gabapentin (NEURONTIN) capsule 300 mg, 300 mg, Oral, TID, Barb Merino, MD, 300 mg at 01/13/20 2141 .  hydrALAZINE (APRESOLINE) tablet 25 mg, 25 mg, Oral, Q6H PRN, Barb Merino, MD .  HYDROmorphone (DILAUDID) injection 0.5-1 mg, 0.5-1 mg, Intravenous, Q2H PRN, Barb Merino, MD, 1 mg at 01/14/20 0545 .  levothyroxine (SYNTHROID) tablet 200 mcg, 200 mcg, Oral, Q0600, Barb Merino, MD, 200 mcg at 01/14/20 0544 .  levothyroxine (SYNTHROID) tablet 88 mcg, 88 mcg, Oral, Q0600, Barb Merino, MD, 88 mcg at 01/14/20 0606 .  LORazepam (ATIVAN) tablet 0.5 mg, 0.5 mg, Oral, Q6H PRN, Barb Merino, MD, 0.5 mg at 01/13/20 2141 .  magic mouthwash, 10 mL, Oral, QID PRN, Barb Merino, MD .  methylphenidate (RITALIN) tablet 10 mg, 10 mg, Oral, BID, Ghimire, Kuber, MD .  metolazone (ZAROXOLYN) tablet 5 mg, 5 mg, Oral, q1800, Barb Merino, MD .  multivitamin with minerals tablet 1 tablet, 1 tablet, Oral, Daily, Ghimire, Kuber, MD .  oxyCODONE (Oxy IR/ROXICODONE) immediate release tablet 5 mg, 5 mg, Oral, Q4H PRN, Barb Merino, MD, 5 mg at 01/13/20 1857 .  potassium chloride (KLOR-CON) CR tablet 10 mEq, 10 mEq, Oral, Daily, Ghimire, Kuber, MD .  predniSONE (DELTASONE) tablet 50 mg, 50 mg, Oral, Q breakfast, Ghimire, Kuber, MD, 50 mg at 01/13/20 1857 .  promethazine (PHENERGAN) tablet 12.5 mg, 12.5 mg, Oral, Q6H PRN **OR** promethazine (PHENERGAN) injection 12.5 mg, 12.5 mg, Intravenous, Q6H PRN, Ghimire, Kuber, MD .  sodium chloride flush (NS) 0.9 % injection 10-40 mL, 10-40 mL, Intracatheter, Q12H, Barb Merino, MD, 10 mL at 01/14/20 0439 .  sodium chloride flush (NS) 0.9 % injection 10-40 mL, 10-40 mL, Intracatheter, PRN, Barb Merino, MD .  sodium chloride flush (NS) 0.9 % injection 3 mL, 3 mL, Intravenous, Q12H, Ghimire, Kuber, MD, 3 mL at 01/14/20 0440 .  sodium chloride flush (NS) 0.9 % injection 3 mL, 3 mL, Intravenous, PRN, Barb Merino, MD  Facility-Administered Medications Ordered in Other Encounters:  .  HYDROmorphone (DILAUDID) injection 4 mg, 4 mg, Intravenous, Once, Jenise Iannelli, Rudell Cobb, MD:  . Chlorhexidine Gluconate Cloth  6 each Topical Daily  . cloNIDine  0.1 mg Oral BID  . docusate sodium  100 mg Oral BID  . enoxaparin (LOVENOX) injection  120 mg Subcutaneous Q12H  . furosemide  80 mg Oral Daily  . gabapentin  300 mg Oral TID  . levothyroxine  200  mcg Oral Q0600  . levothyroxine  88 mcg Oral Q0600  . methylphenidate  10 mg Oral BID  . metolazone  5 mg Oral q1800  . multivitamin with minerals  1 tablet Oral Daily  . potassium chloride  10 mEq Oral Daily  . predniSONE  50 mg Oral Q breakfast  . sodium chloride flush  10-40 mL Intracatheter Q12H  . sodium chloride flush  3 mL Intravenous Q12H  :  Allergies  Allergen Reactions  . Dextromethorphan-Guaifenesin Other (See Comments)    Irregular heartbeat   . Doxycycline Anaphylaxis, Nausea And Vomiting, Rash and Other (See Comments)    "heart arrythmia" and "dyspepsia" (only oral doxycycline causes reaction)  . Gadobutrol Hives and Other (See Comments)    Patient had MRI scan at [place]. Patient called one hour after he left  imaging facility to report two "blisters" that came up on "back" of lip.    . Gadolinium Derivatives Hives    Patient had MRI scan at [place]. Patient called one hour after he left imaging facility to report two "blisters" that came up on "back" of lip.   . Guaifenesin Palpitations       . Ibuprofen Hives and Itching  . Lisinopril Other (See Comments)    Angioedema  . Pantoprazole Itching  . Tramadol Hives and Itching  . Barium Rash    Developed redness around neck after drinking 1st bottle of Barocat; pt was given Benedryl by ED Mds to be "on the safe side" before drinking 2nd bottle  :  Family History  Problem Relation Age of Onset  . Diabetes Mother   . Heart disease Mother   . Heart failure Mother   . Heart attack Mother   . Hyperlipidemia Mother   . Hypertension Mother   . Hyperlipidemia Father   . Allergic rhinitis Father   . Rashes / Skin problems Father   . Sudden death Neg Hx   . Thyroid disease Neg Hx   :  Social History   Socioeconomic History  . Marital status: Married    Spouse name: Not on file  . Number of children: Not on file  . Years of education: Not on file  . Highest education level: Not on file   Occupational History  . Not on file  Tobacco Use  . Smoking status: Never Smoker  . Smokeless tobacco: Never Used  Vaping Use  . Vaping Use: Never used  Substance and Sexual Activity  . Alcohol use: No  . Drug use: No  . Sexual activity: Not on file  Other Topics Concern  . Not on file  Social History Narrative  . Not on file   Social Determinants of Health   Financial Resource Strain:   . Difficulty of Paying Living Expenses: Not on file  Food Insecurity:   . Worried About Charity fundraiser in the Last Year: Not on file  . Ran Out of Food in the Last Year: Not on file  Transportation Needs:   . Lack of Transportation (Medical): Not on file  . Lack of Transportation (Non-Medical): Not on file  Physical Activity:   . Days of Exercise per Week: Not on file  . Minutes of Exercise per Session: Not on file  Stress:   . Feeling of Stress : Not on file  Social Connections:   . Frequency of Communication with Friends and Family: Not on file  . Frequency of Social Gatherings with Friends and Family: Not on file  . Attends Religious Services: Not on file  . Active Member of Clubs or Organizations: Not on file  . Attends Archivist Meetings: Not on file  . Marital Status: Not on file  Intimate Partner Violence:   . Fear of Current or Ex-Partner: Not on file  . Emotionally Abused: Not on file  . Physically Abused: Not on file  . Sexually Abused: Not on file  :  Review of Systems  Constitutional: Positive for malaise/fatigue.  HENT: Negative.   Eyes: Positive for blurred vision.  Respiratory: Negative.   Cardiovascular: Positive for palpitations.  Gastrointestinal: Positive for abdominal pain.  Genitourinary: Negative.   Musculoskeletal: Negative.   Skin: Positive for rash.  Neurological: Negative.   Endo/Heme/Allergies: Negative.   Psychiatric/Behavioral: Negative.     Exam:  This is a well-developed and  well-nourished white male in no obvious distress.   Vital signs show temperature of 97.3.  Pulse 84.  Blood pressure 121/90.  Head and neck exam shows no ocular or oral lesions.  Palpable cervical or supraclavicular lymph nodes.  Lungs show clear breath sounds bilaterally.  He has good air movement.  There is no friction rub.  Cardiac exam regular rate and rhythm with no murmurs, rubs or bruits.  Abdomen is soft.  He is obese.  There is no fluid wave.  There is no palpable liver or spleen tip.  There are some tenderness in the right upper quadrant of the abdomen which is chronic.  Extremities shows some edema bilaterally.  This is chronic.  No obvious venous cord is noted in his legs.  Neurological exam shows no focal neurological deficits.  Skin exam shows no rashes, ecchymoses or petechia.  Patient Vitals for the past 24 hrs:  BP Temp Temp src Pulse Resp SpO2 Height Weight  01/14/20 0534 121/90 (!) 97.3 F (36.3 C) Oral 84 16 95 % -- --  01/14/20 0206 (!) 141/97 97.6 F (36.4 C) -- 86 17 95 % -- --  01/13/20 2122 (!) 143/104 97.8 F (36.6 C) Oral 96 17 97 % -- --  01/13/20 1800 137/88 98.6 F (37 C) Oral 100 18 96 % -- --  01/13/20 1730 -- -- -- -- -- -- 5\' 10"  (1.778 m) 270 lb (122.5 kg)  01/13/20 1627 (!) 145/105 98.3 F (36.8 C) -- (!) 102 (!) 24 94 % -- --  01/13/20 1538 (!) 146/103 -- -- 100 (!) 22 91 % -- --  01/13/20 1400 (!) 156/99 -- -- (!) 104 20 92 % -- --  01/13/20 1335 (!) 147/107 -- -- (!) 109 15 93 % -- --  01/13/20 1301 -- -- -- -- -- -- 5\' 10"  (1.778 m) 286 lb 9.6 oz (130 kg)  01/13/20 1300 -- 98.5 F (36.9 C) Oral -- -- -- -- --  01/13/20 1257 (!) 145/105 -- -- (!) 127 (!) 24 97 % -- --     Recent Labs    01/13/20 1332 01/14/20 0344  WBC 4.3 2.3*  HGB 11.2* 9.1*  HCT 34.0* 27.3*  PLT 246 208   Recent Labs    01/13/20 1332  NA 135  K 3.8  CL 97*  CO2 27  GLUCOSE 103*  BUN 23*  CREATININE 1.19  CALCIUM 9.1    Blood smear review: None  Pathology: None    Assessment and Plan: [name] is a very  nice 44 year old white male.  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  He is on single agent Adriamycin.  These thromboembolic issues I suppose she no surprise.  He has been active.  Just the underlying malignancy would be the source of the blood clots.  I really do not think we have to do any kind of hypercoagulable work-up on him.  As outpatient therapy, I would recommend Lovenox.  I just think that with pulmonary emboli, that Lovenox is better.  I know that there the newer oral anticoagulants but I just have more confidence in Lovenox.  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  I would like to get a Doppler of his legs to see if there is a thrombus in the legs.  I think this would be important.  Hopefully, he will be able to go home  soon.  Again, he certainly has the experience to be able to do Lovenox himself.  I will give Lovenox twice a day given his size.  I appreciate the great care that he has been getting from all the staff on [address].    Brian Haw, MD  Jeneen Rinks 1:5

## 2020-01-14 NOTE — Progress Notes (Signed)
ANTICOAGULATION CONSULT NOTE - Follow Up Consult  Pharmacy Consult for heparin --> lovenox Indication: acute pulmonary embolus and DVT  Allergies  Allergen Reactions  . Dextromethorphan-Guaifenesin Other (See Comments)    Irregular heartbeat   . Doxycycline Anaphylaxis, Nausea And Vomiting, Rash and Other (See Comments)    "heart arrythmia" and "dyspepsia" (only oral doxycycline causes reaction)  . Gadobutrol Hives and Other (See Comments)    Patient had MRI scan at [place]. Patient called one hour after he left imaging facility to report two "blisters" that came up on "back" of lip.    . Gadolinium Derivatives Hives    Patient had MRI scan at [place]. Patient called one hour after he left imaging facility to report two "blisters" that came up on "back" of lip.   . Guaifenesin Palpitations       . Ibuprofen Hives and Itching  . Lisinopril Other (See Comments)    Angioedema  . Pantoprazole Itching  . Tramadol Hives and Itching  . Barium Rash    Developed redness around neck after drinking 1st bottle of Barocat; pt was given Benedryl by ED Mds to be "on the safe side" before drinking 2nd bottle    Patient Measurements: Height: 5\' 10"  (177.8 cm) Weight: 122.5 kg (270 lb) IBW/kg (Calculated) : 73 Heparin Dosing Weight:   Vital Signs: Temp: 97.3 F (36.3 C) (11/28 0534) Temp Source: Oral (11/28 0534) BP: 121/90 (11/28 0534) Pulse Rate: 84 (11/28 0534)  Labs: Recent Labs    01/13/20 1332 01/13/20 1836 01/13/20 2200 01/14/20 0344 01/14/20 0804  HGB 11.2*  --   --  9.1*  --   HCT 34.0*  --   --  27.3*  --   PLT 246  --   --  208  --   LABPROT 12.8  --   --   --   --   INR 1.0  --   --   --   --   HEPARINUNFRC  --   --  0.32  --  0.26*  CREATININE 1.19  --   --   --   --   TROPONINIHS 6 3  --   --   --     Estimated Creatinine Clearance: 104 mL/min (by C-G formula based on SCr of 1.19 mg/dL).  Assessment: Patient is a 44 y.o M with metastatic  thyroid cancer on chemotherapy PTA presented to the ED on 11/27 with c/o SOB, rash to face, neck and arms. Chest CTA on 11/27 came back positive for bilateral PE (no evident of right heart strain) and LE doppler on 11/28 is positive for LLE DVT. He's currently on heparin drip for VTE.  Pharmacy is consulted on 11/28 to transition patient to [place].  Goal of Therapy:  Heparin level 0.3-0.7 units/ml Monitor platelets by anticoagulation protocol: Yes   Plan:  - d/c heparin drip - start lovenox 120 mg SQ q12h (Dr. Marin Olp entered order) - cbc q72h) - monitor for s/sx bleeding  Masen Luallen P 01/14/2020,9:42 AM

## 2020-01-15 DIAGNOSIS — C787 Secondary malignant neoplasm of liver and intrahepatic bile duct: Secondary | ICD-10-CM | POA: Diagnosis not present

## 2020-01-15 DIAGNOSIS — I2699 Other pulmonary embolism without acute cor pulmonale: Secondary | ICD-10-CM | POA: Diagnosis not present

## 2020-01-15 DIAGNOSIS — I2692 Saddle embolus of pulmonary artery without acute cor pulmonale: Secondary | ICD-10-CM | POA: Diagnosis not present

## 2020-01-15 DIAGNOSIS — C73 Malignant neoplasm of thyroid gland: Secondary | ICD-10-CM | POA: Diagnosis not present

## 2020-01-15 DIAGNOSIS — I82402 Acute embolism and thrombosis of unspecified deep veins of left lower extremity: Secondary | ICD-10-CM | POA: Diagnosis not present

## 2020-01-15 DIAGNOSIS — I824Z2 Acute embolism and thrombosis of unspecified deep veins of left distal lower extremity: Secondary | ICD-10-CM | POA: Diagnosis not present

## 2020-01-15 LAB — CBC
HCT: 26.3 % — ABNORMAL LOW (ref 39.0–52.0)
Hemoglobin: 8.8 g/dL — ABNORMAL LOW (ref 13.0–17.0)
MCH: 34.6 pg — ABNORMAL HIGH (ref 26.0–34.0)
MCHC: 33.5 g/dL (ref 30.0–36.0)
MCV: 103.5 fL — ABNORMAL HIGH (ref 80.0–100.0)
Platelets: 186 10*3/uL (ref 150–400)
RBC: 2.54 MIL/uL — ABNORMAL LOW (ref 4.22–5.81)
RDW: 15.3 % (ref 11.5–15.5)
WBC: 4.6 10*3/uL (ref 4.0–10.5)
nRBC: 0 % (ref 0.0–0.2)

## 2020-01-15 MED ORDER — HEPARIN SOD (PORK) LOCK FLUSH 100 UNIT/ML IV SOLN
500.0000 [IU] | INTRAVENOUS | Status: AC | PRN
  Administered 2020-01-15: 500 [IU]

## 2020-01-15 NOTE — Discharge Summary (Signed)
Physician Discharge Summary  Brian [name] EGB:[id number] DOB: 1975-04-20 DOA: 01/13/2020  PCP: Sharyne Richters, MD  Admit date: 01/13/2020 Discharge date: 01/15/2020  Admitted From: Home Disposition: Home  Recommendations for Outpatient Follow-up:  1. Follow up with PCP in 1-2 weeks 2. Follow-up as scheduled by oncology.  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  He was symptomatic and tachycardic so admitted to the hospital.  He was initially treated with heparin infusion and ultimately converted to Lovenox.  He has familiarized himself about using Lovenox and is comfortable taking it at home.  Also seen by his oncologist and patient is currently going home with Lovenox.  He has bilateral pulmonary embolism, he has left popliteal DVT.  This thromboembolisms were aggravated by active cancer, he needs to be on anticoagulation as long as possible.  He was seen by his oncologist Dr. Marin Olp in the hospital and he will continue to follow. Patient will continue to take other long-term medications. He had a rash developed on his forehead and cheek that improved with Benadryl and prednisone.  He will take Benadryl over-the-counter.  He will take 1 dose of prednisone if he develops recurrent rashes.   Discharge Diagnoses:  Principal Problem:   Pulmonary embolism without acute cor pulmonale (HCC) Active Problems:   Gastroesophageal reflux disease without esophagitis   Primary cancer of thyroid with metastasis to other site [facility])   Essential hypertension   Goals of care, counseling/discussion   DVT, lower extremity, distal, acute,  left [facility])    Discharge Instructions  Discharge Instructions    Call MD for:  difficulty breathing, headache or visual disturbances   Complete by: As directed    Diet - low sodium heart healthy   Complete by: As directed    Discharge instructions   Complete by: As directed    Light work and avoid heavy exertion for 2 weeks   Increase activity slowly   Complete by: As directed      Allergies as of 01/15/2020      Reactions   Dextromethorphan-guaifenesin Other (See Comments)   Irregular heartbeat   Doxycycline Anaphylaxis, Nausea And Vomiting, Rash, Other (See Comments)   "heart arrythmia" and "dyspepsia" (only oral doxycycline causes reaction)   Gadobutrol Hives, Other (See Comments)   Patient had MRI scan at [place]. Patient called one hour after he left imaging facility to report two "blisters" that came up on "back" of lip.    Gadolinium Derivatives Hives   Patient had MRI scan at [place]. Patient called one hour after he left imaging facility to report two "blisters" that came up on "back" of lip.    Guaifenesin Palpitations      Ibuprofen Hives, Itching   Lisinopril Other (See Comments)   Angioedema   Pantoprazole Itching   Tramadol Hives, Itching   Barium Rash   Developed redness around neck after drinking 1st bottle of Barocat; pt was given Benedryl by ED Mds to be "on the safe side" before drinking 2nd bottle      Medication List    STOP taking these medications   isosorbide mononitrate 60 MG 24 hr tablet Commonly known as: IMDUR   OLANZapine 10 MG tablet Commonly known as:  ZYPREXA   ondansetron 8 MG tablet Commonly known as: Zofran     TAKE these medications   baclofen 10 MG tablet Commonly known as: LIORESAL TAKE 1 TABLET BY MOUTH THREE TIMES DAILY What changed:   when to take this  reasons to take this   cloNIDine 0.1 MG tablet Commonly known as: CATAPRES Take 0.1 mg by mouth 2 (two) times daily as needed (high blood  pressure). What changed: Another medication with the same name was removed. Continue taking this medication, and follow the directions you see here.   cyclobenzaprine 10 MG tablet Commonly known as: FLEXERIL Take 1 tablet (10 mg total) by mouth 3 (three) times daily as needed for muscle spasms.   enoxaparin 120 MG/0.8ML injection Commonly known as: LOVENOX Inject 0.8 mLs (120 mg total) into the skin every 12 (twelve) hours.   EPINEPHrine 0.3 mg/0.3 mL Soaj injection Commonly known as: EpiPen 2-Pak USE AS DIRECTED FOR LIFE THREATENING ALLERGIC REACTIONS   furosemide 80 MG tablet Commonly known as: LASIX TAKE 1 TABLET (80 MG TOTAL) BY MOUTH DAILY. What changed:   when to take this  reasons to take this   gabapentin 300 MG capsule Commonly known as: NEURONTIN TAKE 1 CAPSULE (300 MG TOTAL) BY MOUTH 3 (THREE) TIMES DAILY.   Janssen COVID-19 Vaccine 0.5 ML injection Generic drug: COVID-19 Ad26 vaccine (JANSSEN/J&J)   levothyroxine 200 MCG tablet Commonly known as: SYNTHROID Take 200 mcg by mouth daily. Take along with 88 mcg=288 mcg   levothyroxine 88 MCG tablet Commonly known as: SYNTHROID Take 88 mcg by mouth daily before breakfast. Take along with 200 mcg=288 mcg   lidocaine-prilocaine cream Commonly known as: EMLA Apply to affected area once   LORazepam 0.5 MG tablet Commonly known as: ATIVAN TAKE 1 TABLET (0.5 MG TOTAL) BY MOUTH EVERY 6 (SIX) HOURS AS NEEDED (NAUSEA OR VOMITING).   losartan 25 MG tablet Commonly known as: COZAAR Take 1 tablet (25 mg total) by mouth 2 (two) times daily. What changed:   when to take this  reasons to take this   magic mouthwash Soln Take 10 mLs by mouth 4 (four) times daily as needed for mouth pain.   methylphenidate 10 MG tablet Commonly known as: RITALIN Take 1 tablet (10 mg total) by mouth 2 (two) times daily. What changed: when to take this   metoCLOPramide 10 MG tablet Commonly known as: REGLAN TAKE 1 TABLET BY MOUTH  FOUR TIMES DAILY BEFORE MEALS AND AT BEDTIME What changed: See the new instructions.   metolazone 5 MG tablet Commonly known as: ZAROXOLYN TAKE 1 TABLET (5 MG TOTAL) BY MOUTH DAILY. TAKE 1 HOUR BEFORE LASIX.   multivitamin capsule Take 1 capsule by mouth daily.   oxyCODONE 20 mg 12 hr tablet Commonly known as: OXYCONTIN Take 1 tablet (20 mg total) by mouth every 12 (twelve) hours. What changed: Another medication with the same name was changed. Make sure you understand how and when to take each.   Oxycodone HCl 10 MG Tabs TAKE 1 TABLET (10 MG TOTAL) BY MOUTH EVERY 6 (SIX) HOURS AS NEEDED. What changed: reasons to take this   potassium chloride 10 MEQ tablet Commonly known as: KLOR-CON TAKE 1 TABLET (10 MEQ TOTAL) BY MOUTH DAILY.   prochlorperazine 10 MG tablet Commonly known as: COMPAZINE Take 1 tablet (10 mg total) by mouth every 6 (six) hours as needed (Nausea or vomiting).   sucralfate 1 GM/10ML suspension Commonly known as: Carafate Take 10 mLs (1 g total)  by mouth 4 (four) times daily -  with meals and at bedtime. What changed:   when to take this  reasons to take this       Allergies  Allergen Reactions  . Dextromethorphan-Guaifenesin Other (See Comments)    Irregular heartbeat   . Doxycycline Anaphylaxis, Nausea And Vomiting, Rash and Other (See Comments)    "heart arrythmia" and "dyspepsia" (only oral doxycycline causes reaction)  . Gadobutrol Hives and Other (See Comments)    Patient had MRI scan at [place]. Patient called one hour after he left imaging facility to report two "blisters" that came up on "back" of lip.    . Gadolinium Derivatives Hives    Patient had MRI scan at [place]. Patient called one hour after he left imaging facility to report two "blisters" that came up on "back" of lip.   . Guaifenesin Palpitations       . Ibuprofen Hives and Itching  . Lisinopril Other (See Comments)    Angioedema  . Pantoprazole Itching   . Tramadol Hives and Itching  . Barium Rash    Developed redness around neck after drinking 1st bottle of Barocat; pt was given Benedryl by ED Mds to be "on the safe side" before drinking 2nd bottle    Consultations:  Hematology oncology   Procedures/Studies: CT Angio Chest PE W and/or Wo Contrast  Result Date: 01/13/2020 CLINICAL DATA:  Shortness of breath. History of metastatic thyroid carcinoma EXAM: CT ANGIOGRAPHY CHEST WITH CONTRAST TECHNIQUE: Multidetector CT imaging of the chest was performed using the standard protocol during bolus administration of intravenous contrast. Multiplanar CT image reconstructions and MIPs were obtained to evaluate the vascular anatomy. CONTRAST:  69 mL OMNIPAQUE IOHEXOL 350 MG/ML SOLN COMPARISON:  Chest CT July 27, 2019; chest radiograph January 13, 2020 FINDINGS: Cardiovascular: There are multiple pulmonary emboli bilaterally. Pulmonary embolus on the left arises from the distal main pulmonary outflow tract with multiple upper and lower lobe pulmonary emboli seen on the left. On the right, there are multiple lower lobe and to a lesser extent upper lobe pulmonary artery branches containing pulmonary emboli. No right main pulmonary artery emboli. The right ventricle to left ventricle diameter ratio is less than 0.9, not indicative of right heart strain. No thoracic aortic aneurysm. Visualized great vessels appear normal. No pericardial effusion or pericardial thickening evident. Mediastinum/Nodes: There is again noted fluid in the right lower neck region, less than on prior study. Apparent residual cystic area measures 4.1 x 2.3 cm. This fluid collection appears separate from the thyroid. No thyroid lesions are evident. Only a small amount of thyroid tissue is evident currently. There is no appreciable thoracic adenopathy. No esophageal lesions are appreciable. Lungs/Pleura: There are scattered areas of atelectatic change. There is no edema or consolidation. No  pulmonary nodular lesions are appreciable. There is an azygos lobe on the right, an anatomic variant. There are no pleural effusions. Upper Abdomen: There is ill-defined opacity in the anterior segment right lobe of the liver measuring 3.3 x 2.7 cm, a potential metastatic focus. Gallbladder is absent. Visualized upper abdominal structures appear normal. Musculoskeletal: There is degenerative change in the thoracic spine. No blastic or lytic bone lesions. No chest wall lesions are evident. Review of the MIP images confirms the above findings. IMPRESSION: 1. Multifocal pulmonary emboli, somewhat more on the left than on the right, without evident right heart strain. 2. Apparent metastasis in the right lobe of the liver, also present previously. 3. Fluid collection in  the right lower neck region, smaller than on previous study and likely of postoperative etiology. 4. Scattered areas of atelectasis. No pulmonary nodular lesions are consolidation. 5.  No adenopathy evident. 6.  Gallbladder absent. Critical Value/emergent results were called by telephone at the time of interpretation on 01/13/2020 at 3:02 pm to provider [facility] , who verbally acknowledged these results. Electronically Signed   By: Lowella Grip III M.D.   On: 01/13/2020 15:03   DG Chest Port 1 View  Result Date: 01/13/2020 CLINICAL DATA:  Shortness of breath.  Thyroid carcinoma EXAM: PORTABLE CHEST 1 VIEW COMPARISON:  November 12, 2018 chest radiograph; chest CT July 27, 2019. FINDINGS: There is slight atelectasis in the bases. The lungs elsewhere are clear. There is an azygos lobe on the right, an anatomic variant. Heart size and pulmonary vascularity are normal. Port-A-Cath tip is at the cavoatrial junction. No pneumothorax. No bone lesions. IMPRESSION: Mild bibasilar atelectasis. Lungs elsewhere clear. Cardiac silhouette normal. No adenopathy evident. Port-A-Cath tip cavoatrial junction. Electronically Signed   By: Lowella Grip  III M.D.   On: 01/13/2020 13:22   VAS [place] LOWER EXTREMITY VENOUS (DVT)  Result Date: 01/15/2020  Lower Venous DVT Study Indications: Pulmonary embolism.  Risk Factors: Cancer Primary cancer of thryoid with metastasis. Limitations: Poor ultrasound/tissue interface. Comparison Study: No prior studies. Performing Technologist: Darlin Coco, RDMS  Examination Guidelines: A complete evaluation includes B-mode imaging, spectral Doppler, color Doppler, and power Doppler as needed of all accessible portions of each vessel. Bilateral testing is considered an integral part of a complete examination. Limited examinations for reoccurring indications may be performed as noted. The reflux portion of the exam is performed with the patient in reverse Trendelenburg.  +---------+---------------+---------+-----------+----------+---------------+ RIGHT    CompressibilityPhasicitySpontaneityPropertiesThrombus Aging  +---------+---------------+---------+-----------+----------+---------------+ CFV      Full           Yes      Yes                                  +---------+---------------+---------+-----------+----------+---------------+ SFJ      Full                                                         +---------+---------------+---------+-----------+----------+---------------+ FV Prox  Full                                                         +---------+---------------+---------+-----------+----------+---------------+ FV Mid   Full                                                         +---------+---------------+---------+-----------+----------+---------------+ FV DistalFull                                                         +---------+---------------+---------+-----------+----------+---------------+  PFV      Full                                                         +---------+---------------+---------+-----------+----------+---------------+ POP      Full            Yes      Yes                                  +---------+---------------+---------+-----------+----------+---------------+ PTV      Full                                                         +---------+---------------+---------+-----------+----------+---------------+ PERO                                                  Patent by color +---------+---------------+---------+-----------+----------+---------------+   +---------+---------------+---------+-----------+----------+-------------------+ LEFT     CompressibilityPhasicitySpontaneityPropertiesThrombus Aging      +---------+---------------+---------+-----------+----------+-------------------+ CFV      Full           Yes      Yes                                      +---------+---------------+---------+-----------+----------+-------------------+ SFJ      Full                                                             +---------+---------------+---------+-----------+----------+-------------------+ FV Prox  Full                                                             +---------+---------------+---------+-----------+----------+-------------------+ FV Mid   Full                                                             +---------+---------------+---------+-----------+----------+-------------------+ FV DistalFull                                                             +---------+---------------+---------+-----------+----------+-------------------+ PFV      Full                                                             +---------+---------------+---------+-----------+----------+-------------------+  POP      None           No       No                   Acute               +---------+---------------+---------+-----------+----------+-------------------+ PTV                                                   Not well visualized  +---------+---------------+---------+-----------+----------+-------------------+ PERO                                                  Not well visualized +---------+---------------+---------+-----------+----------+-------------------+     Summary: RIGHT: - There is no evidence of deep vein thrombosis in the lower extremity.  - No cystic structure found in the popliteal fossa.  LEFT: - Findings consistent with acute deep vein thrombosis involving the left popliteal vein. - No cystic structure found in the popliteal fossa.  *See table(s) above for measurements and observations. Electronically signed by Jamelle Haring on 01/15/2020 at 12:40:14 PM.    Final    (Echo, Carotid, EGD, Colonoscopy, ERCP)    Subjective: Patient seen and examined.  No overnight events.  He had slightly improved mobility and was able to walk 50 feet without undue shortness of breath.  Denies any chest pain. He had 1 hard bowel movement yesterday and it had trace of blood on it.   Discharge Exam: Vitals:   01/14/20 2151 01/15/20 0453  BP: (!) 138/94 121/89  Pulse: 87 72  Resp: 18 14  Temp: 98.1 F (36.7 C) (!) 97.5 F (36.4 C)  SpO2: 97% 97%   Vitals:   01/14/20 2147 01/14/20 2151 01/15/20 0453 01/15/20 0500  BP: (!) 138/94 (!) 138/94 121/89   Pulse:  87 72   Resp:  18 14   Temp:  98.1 F (36.7 C) (!) 97.5 F (36.4 C)   TempSrc:  Oral Oral   SpO2:  97% 97%   Weight:    127.7 kg  Height:        General: Pt is alert, awake, not in acute distress, on room air. Cardiovascular: RRR, S1/S2 +, no rubs, no gallops Respiratory: CTA bilaterally, no wheezing, no rhonchi, port on the left chest. Abdominal: Soft, NT, ND, bowel sounds + Extremities: no edema, no cyanosis    The results of significant diagnostics from this hospitalization (including imaging, microbiology, ancillary and laboratory) are listed below for reference.     Microbiology: Recent Results (from the past 240 hour(s))  Resp Panel by  RT-PCR (Flu A&B, Covid) Nasopharyngeal Swab     Status: None   Collection Time: 01/13/20  3:42 PM   Specimen: Nasopharyngeal Swab; Nasopharyngeal(NP) swabs in vial transport medium  Result Value Ref Range Status   SARS Coronavirus 2 by RT PCR NEGATIVE NEGATIVE Final    Comment: (NOTE) SARS-CoV-2 target nucleic acids are NOT DETECTED.  The SARS-CoV-2 RNA is generally detectable in upper respiratory specimens during the acute phase of infection. The lowest concentration of SARS-CoV-2 viral copies this assay can detect is 138 copies/mL. A negative result does not preclude SARS-Cov-2 infection and should not  be used as the sole basis for treatment or other patient management decisions. A negative result may occur with  improper specimen collection/handling, submission of specimen other than nasopharyngeal swab, presence of viral mutation(s) within the areas targeted by this assay, and inadequate number of viral copies(<138 copies/mL). A negative result must be combined with clinical observations, patient history, and epidemiological information. The expected result is Negative.  Fact Sheet for Patients:  EntrepreneurPulse.com.au  Fact Sheet for Healthcare Providers:  IncredibleEmployment.be  This test is no t yet approved or cleared by the [place] FDA and  has been authorized for detection and/or diagnosis of SARS-CoV-2 by FDA under an Emergency Use Authorization (EUA). This EUA will remain  in effect (meaning this test can be used) for the duration of the COVID-19 declaration under Section 564(b)(1) of the Act, 21 U.S.C.section 360bbb-3(b)(1), unless the authorization is terminated  or revoked sooner.       Influenza A by PCR NEGATIVE NEGATIVE Final   Influenza B by PCR NEGATIVE NEGATIVE Final    Comment: (NOTE) The Xpert Xpress SARS-CoV-2/FLU/RSV plus assay is intended as an aid in the diagnosis of influenza from Nasopharyngeal swab  specimens and should not be used as a sole basis for treatment. Nasal washings and aspirates are unacceptable for Xpert Xpress SARS-CoV-2/FLU/RSV testing.  Fact Sheet for Patients: EntrepreneurPulse.com.au  Fact Sheet for Healthcare Providers: IncredibleEmployment.be  This test is not yet approved or cleared by the [place] FDA and has been authorized for detection and/or diagnosis of SARS-CoV-2 by FDA under an Emergency Use Authorization (EUA). This EUA will remain in effect (meaning this test can be used) for the duration of the COVID-19 declaration under Section 564(b)(1) of the Act, 21 U.S.C. section 360bbb-3(b)(1), unless the authorization is terminated or revoked.  Performed at [facility], [address] [place], [place] [address]      Labs: BNP (last 3 results) No results for input(s): BNP in the last 8760 hours. Basic Metabolic Panel: Recent Labs  Lab 01/09/20 1335 01/13/20 1332 01/14/20 0344  NA 139 135 131*  K 2.9* 3.8 4.1  CL 94* 97* 99  CO2 35* 27 27  GLUCOSE 115* 103* 184*  BUN 24* 23* 22*  CREATININE 1.38* 1.19 0.97  CALCIUM 9.3 9.1 8.4*  MG  --  1.5* 1.9  PHOS  --   --  2.4*   Liver Function Tests: Recent Labs  Lab 01/09/20 1335 01/13/20 1332 01/14/20 0344  AST 25 26 18   ALT 26 23 18   ALKPHOS 83 79 66  BILITOT 0.6 1.3* 0.5  PROT 6.5 6.6 5.4*  ALBUMIN 4.1 4.0 3.4*   No results for input(s): LIPASE, AMYLASE in the last 168 hours. No results for input(s): AMMONIA in the last 168 hours. CBC: Recent Labs  Lab 01/09/20 1335 01/13/20 1332 01/14/20 0344 01/15/20 0331  WBC 4.2 4.3 2.3* 4.6  NEUTROABS 2.9 3.5  --   --   HGB 10.4* 11.2* 9.1* 8.8*  HCT 32.1* 34.0* 27.3* 26.3*  MCV 102.9* 102.4* 101.5* 103.5*  PLT 206 246 208 186   Cardiac Enzymes: No results for input(s): CKTOTAL, CKMB, CKMBINDEX, TROPONINI in the last 168 hours. BNP: Invalid input(s): POCBNP CBG: No results for  input(s): GLUCAP in the last 168 hours. D-Dimer No results for input(s): DDIMER in the last 72 hours. Hgb A1c No results for input(s): HGBA1C in the last 72 hours. Lipid Profile No results for input(s): CHOL, HDL, LDLCALC, TRIG, CHOLHDL, LDLDIRECT in the  last 72 hours. Thyroid function studies No results for input(s): TSH, T4TOTAL, T3FREE, THYROIDAB in the last 72 hours.  Invalid input(s): FREET3 Anemia work up No results for input(s): VITAMINB12, FOLATE, FERRITIN, TIBC, IRON, RETICCTPCT in the last 72 hours. Urinalysis    Component Value Date/Time   COLORURINE YELLOW 09/06/2019 1403   APPEARANCEUR CLEAR 09/06/2019 1403   LABSPEC >1.030 (H) 09/06/2019 1403   PHURINE 7.5 09/06/2019 1403   GLUCOSEU NEGATIVE 09/06/2019 1403   HGBUR NEGATIVE 09/06/2019 1403   BILIRUBINUR NEGATIVE 09/06/2019 1403   BILIRUBINUR negative 06/11/2013 1309   KETONESUR NEGATIVE 09/06/2019 1403   PROTEINUR negative 01/04/2020 1432   UROBILINOGEN 0.2 06/12/2013 0149   NITRITE NEGATIVE 09/06/2019 1403   LEUKOCYTESUR NEGATIVE 09/06/2019 1403   Sepsis Labs Invalid input(s): PROCALCITONIN,  WBC,  LACTICIDVEN Microbiology Recent Results (from the past 240 hour(s))  Resp Panel by RT-PCR (Flu A&B, Covid) Nasopharyngeal Swab     Status: None   Collection Time: 01/13/20  3:42 PM   Specimen: Nasopharyngeal Swab; Nasopharyngeal(NP) swabs in vial transport medium  Result Value Ref Range Status   SARS Coronavirus 2 by RT PCR NEGATIVE NEGATIVE Final    Comment: (NOTE) SARS-CoV-2 target nucleic acids are NOT DETECTED.  The SARS-CoV-2 RNA is generally detectable in upper respiratory specimens during the acute phase of infection. The lowest concentration of SARS-CoV-2 viral copies this assay can detect is 138 copies/mL. A negative result does not preclude SARS-Cov-2 infection and should not be used as the sole basis for treatment or other patient management decisions. A negative result may occur with  improper  specimen collection/handling, submission of specimen other than nasopharyngeal swab, presence of viral mutation(s) within the areas targeted by this assay, and inadequate number of viral copies(<138 copies/mL). A negative result must be combined with clinical observations, patient history, and epidemiological information. The expected result is Negative.  Fact Sheet for Patients:  EntrepreneurPulse.com.au  Fact Sheet for Healthcare Providers:  IncredibleEmployment.be  This test is no t yet approved or cleared by the [place] FDA and  has been authorized for detection and/or diagnosis of SARS-CoV-2 by FDA under an Emergency Use Authorization (EUA). This EUA will remain  in effect (meaning this test can be used) for the duration of the COVID-19 declaration under Section 564(b)(1) of the Act, 21 U.S.C.section 360bbb-3(b)(1), unless the authorization is terminated  or revoked sooner.       Influenza A by PCR NEGATIVE NEGATIVE Final   Influenza B by PCR NEGATIVE NEGATIVE Final    Comment: (NOTE) The Xpert Xpress SARS-CoV-2/FLU/RSV plus assay is intended as an aid in the diagnosis of influenza from Nasopharyngeal swab specimens and should not be used as a sole basis for treatment. Nasal washings and aspirates are unacceptable for Xpert Xpress SARS-CoV-2/FLU/RSV testing.  Fact Sheet for Patients: EntrepreneurPulse.com.au  Fact Sheet for Healthcare Providers: IncredibleEmployment.be  This test is not yet approved or cleared by the [place] FDA and has been authorized for detection and/or diagnosis of SARS-CoV-2 by FDA under an Emergency Use Authorization (EUA). This EUA will remain in effect (meaning this test can be used) for the duration of the COVID-19 declaration under Section 564(b)(1) of the Act, 21 U.S.C. section 360bbb-3(b)(1), unless the authorization is terminated or revoked.  Performed at  [facility], [address] [place], [place] [address]      Time coordinating discharge: 32 minutes  SIGNED:   Barb Merino, MD  [facility] 01/15/2020, 1:53 PM

## 2020-01-15 NOTE — TOC Transition Note (Signed)
Transition of Care [facility]) - CM/SW Discharge Note   Patient Details  Name: Brian [name] MRN: [id number] Date of Birth: May 02, 1975  Transition of [facility]) CM/SW Contact:  Dessa Phi, RN Phone Number: 01/15/2020, 10:27 AM   Clinical Narrative:d/c home. No CM needs.       Final next level of care: Home/Self Care Barriers to Discharge: No Barriers Identified   Patient Goals and CMS Choice Patient states their goals for this hospitalization and ongoing recovery are:: home CMS Medicare.gov Compare Post Acute Care list provided to:: Patient    Discharge Placement                       Discharge Plan and Services   Discharge Planning Services: CM Consult                                 Social Determinants of Health (SDOH) Interventions     Readmission Risk Interventions Readmission Risk Prevention Plan 01/15/2020  Transportation Screening Complete  Medication Review Press photographer) Complete  PCP or Specialist appointment within 3-5 days of discharge Complete  HRI or [place] Complete  SW Recovery Care/Counseling Consult Complete  [place] Not Applicable  Some recent data might be hidden

## 2020-01-15 NOTE — Plan of Care (Signed)
  Problem: Activity: Goal: Risk for activity intolerance will decrease Outcome: Progressing   Problem: Pain Managment: Goal: General experience of comfort will improve Outcome: Progressing   Problem: Education: Goal: Knowledge of General Education information will improve Description: Including pain rating scale, medication(s)/side effects and non-pharmacologic comfort measures Outcome: Completed/Met   

## 2020-01-15 NOTE — Progress Notes (Signed)
I am not surprised by the fact that his a thrombus in the left leg.  I am sure this is where everything started.  He is now on Lovenox.  We will keep him on Lovenox as an outpatient.  His white cell count is better.  It is 4.6.  Hemoglobin 8.8.  Platelet count under 86,000.  He is walking a little bit.  His appetite is doing okay.  Is no chest wall pain.  He does have some chronic pain in the right upper quadrant of the abdomen.  His vital signs all look stable.  Temperature 97.5.  Pulse 72.  Blood pressure 121/89.  Oxygen saturation 97%.  There really is no change in his overall physical exam from yesterday.  Hopefully, he will be able to go home today.  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  I know that he got great care from all the staff of [facility].  I appreciate their compassion and there professional approach to patient care.  Lattie Haw, MD  Oswaldo Milian 41:10

## 2020-01-15 NOTE — Progress Notes (Signed)
Pt discharge instructions reviewed with pt. Pt verbalized understanding of instructions including meds.  Pt able to successfully return demonstrate Lovenox injection. Cevin Rubinstein, Laurel Dimmer, RN

## 2020-01-16 ENCOUNTER — Other Ambulatory Visit: Payer: Self-pay | Admitting: *Deleted

## 2020-01-16 ENCOUNTER — Encounter: Payer: Self-pay | Admitting: Hematology & Oncology

## 2020-01-16 NOTE — Patient Outreach (Signed)
[facility]) Care Management  01/16/2020  Brian [name] [birth year] [id number]  Transition of care telephone call  Referral received:01/15/20 Initial outreach:01/16/20 Insurance: UMR   Initial unsuccessful telephone call to patient's preferred number in order to complete transition of care assessment; no answer, left HIPAA compliant voicemail message requesting return call.   Objective: Per electronic record, [name]  was hospitalized at [facility]  for Bilateral Pulmonary Embolus, Left popliteal DVT 11/27-11/29/21. .  Comorbidities include: Metastatic thyroid cancer on chemotherapy, Hypertension GERD He was discharged to home on 01/15/20 without the need for home health services or DME.    Plan: This RNCM will route unsuccessful outreach letter with [place] Management pamphlet and 24 hour Nurse Advice Line Magnet to [place] [facility] clinical pool to be mailed to patient's home address. This RNCM will attempt another outreach within 4 business days.  Joylene Draft, RN, BSN  [place] Management Coordinator  [phone]- Mobile [phone]- Toll Free Main Office

## 2020-01-18 ENCOUNTER — Encounter: Payer: Self-pay | Admitting: Family

## 2020-01-18 ENCOUNTER — Other Ambulatory Visit: Payer: Self-pay

## 2020-01-18 ENCOUNTER — Inpatient Hospital Stay: Payer: 59

## 2020-01-18 ENCOUNTER — Inpatient Hospital Stay: Payer: 59 | Attending: Hematology & Oncology

## 2020-01-18 ENCOUNTER — Other Ambulatory Visit: Payer: Self-pay | Admitting: Family

## 2020-01-18 ENCOUNTER — Inpatient Hospital Stay (HOSPITAL_BASED_OUTPATIENT_CLINIC_OR_DEPARTMENT_OTHER): Payer: 59 | Admitting: Family

## 2020-01-18 VITALS — BP 131/84 | HR 102 | Temp 98.5°F | Resp 20

## 2020-01-18 DIAGNOSIS — R5383 Other fatigue: Secondary | ICD-10-CM | POA: Insufficient documentation

## 2020-01-18 DIAGNOSIS — D649 Anemia, unspecified: Secondary | ICD-10-CM

## 2020-01-18 DIAGNOSIS — R42 Dizziness and giddiness: Secondary | ICD-10-CM | POA: Insufficient documentation

## 2020-01-18 DIAGNOSIS — C787 Secondary malignant neoplasm of liver and intrahepatic bile duct: Secondary | ICD-10-CM | POA: Diagnosis not present

## 2020-01-18 DIAGNOSIS — D509 Iron deficiency anemia, unspecified: Secondary | ICD-10-CM

## 2020-01-18 DIAGNOSIS — C73 Malignant neoplasm of thyroid gland: Secondary | ICD-10-CM | POA: Diagnosis not present

## 2020-01-18 DIAGNOSIS — Z7901 Long term (current) use of anticoagulants: Secondary | ICD-10-CM | POA: Insufficient documentation

## 2020-01-18 DIAGNOSIS — R000 Tachycardia, unspecified: Secondary | ICD-10-CM | POA: Diagnosis not present

## 2020-01-18 DIAGNOSIS — Z95828 Presence of other vascular implants and grafts: Secondary | ICD-10-CM

## 2020-01-18 DIAGNOSIS — R0602 Shortness of breath: Secondary | ICD-10-CM | POA: Diagnosis not present

## 2020-01-18 DIAGNOSIS — Z79899 Other long term (current) drug therapy: Secondary | ICD-10-CM | POA: Diagnosis not present

## 2020-01-18 DIAGNOSIS — Z5111 Encounter for antineoplastic chemotherapy: Secondary | ICD-10-CM | POA: Diagnosis not present

## 2020-01-18 DIAGNOSIS — I82432 Acute embolism and thrombosis of left popliteal vein: Secondary | ICD-10-CM | POA: Insufficient documentation

## 2020-01-18 DIAGNOSIS — I2699 Other pulmonary embolism without acute cor pulmonale: Secondary | ICD-10-CM | POA: Insufficient documentation

## 2020-01-18 LAB — CMP (CANCER CENTER ONLY)
ALT: 26 U/L (ref 0–44)
AST: 21 U/L (ref 15–41)
Albumin: 3.9 g/dL (ref 3.5–5.0)
Alkaline Phosphatase: 67 U/L (ref 38–126)
Anion gap: 6 (ref 5–15)
BUN: 21 mg/dL — ABNORMAL HIGH (ref 6–20)
CO2: 29 mmol/L (ref 22–32)
Calcium: 9.5 mg/dL (ref 8.9–10.3)
Chloride: 101 mmol/L (ref 98–111)
Creatinine: 0.97 mg/dL (ref 0.61–1.24)
GFR, Estimated: >60 mL/min (ref >60)
Glucose, Bld: 161 mg/dL — ABNORMAL HIGH (ref 70–99)
Potassium: 3.6 mmol/L (ref 3.5–5.1)
Sodium: 136 mmol/L (ref 135–145)
Total Bilirubin: 0.3 mg/dL (ref 0.3–1.2)
Total Protein: 5.9 g/dL — ABNORMAL LOW (ref 6.5–8.1)

## 2020-01-18 LAB — RETICULOCYTES
Immature Retic Fract: 49.1 % — ABNORMAL HIGH (ref 2.3–15.9)
RBC.: 2.94 MIL/uL — ABNORMAL LOW (ref 4.22–5.81)
Retic Count, Absolute: 156.4 10*3/uL (ref 19.0–186.0)
Retic Ct Pct: 5.3 % — ABNORMAL HIGH (ref 0.4–3.1)

## 2020-01-18 LAB — CBC WITH DIFFERENTIAL (CANCER CENTER ONLY)
Abs Immature Granulocytes: 0.05 10*3/uL (ref 0.00–0.07)
Basophils Absolute: 0 10*3/uL (ref 0.0–0.1)
Basophils Relative: 1 %
Eosinophils Absolute: 0 10*3/uL (ref 0.0–0.5)
Eosinophils Relative: 1 %
HCT: 30.4 % — ABNORMAL LOW (ref 39.0–52.0)
Hemoglobin: 9.9 g/dL — ABNORMAL LOW (ref 13.0–17.0)
Immature Granulocytes: 2 %
Lymphocytes Relative: 16 %
Lymphs Abs: 0.5 10*3/uL — ABNORMAL LOW (ref 0.7–4.0)
MCH: 34.1 pg — ABNORMAL HIGH (ref 26.0–34.0)
MCHC: 32.6 g/dL (ref 30.0–36.0)
MCV: 104.8 fL — ABNORMAL HIGH (ref 80.0–100.0)
Monocytes Absolute: 0.2 10*3/uL (ref 0.1–1.0)
Monocytes Relative: 8 %
Neutro Abs: 2.2 10*3/uL (ref 1.7–7.7)
Neutrophils Relative %: 72 %
Platelet Count: 178 10*3/uL (ref 150–400)
RBC: 2.9 MIL/uL — ABNORMAL LOW (ref 4.22–5.81)
RDW: 16.6 % — ABNORMAL HIGH (ref 11.5–15.5)
WBC Count: 2.9 10*3/uL — ABNORMAL LOW (ref 4.0–10.5)
nRBC: 2 % — ABNORMAL HIGH (ref 0.0–0.2)

## 2020-01-18 LAB — LACTATE DEHYDROGENASE: LDH: 270 U/L — ABNORMAL HIGH (ref 98–192)

## 2020-01-18 MED ORDER — SODIUM CHLORIDE 0.9% FLUSH
10.0000 mL | Freq: Once | INTRAVENOUS | Status: AC
  Administered 2020-01-18: 10 mL via INTRAVENOUS
  Filled 2020-01-18: qty 10

## 2020-01-18 MED ORDER — HEPARIN SOD (PORK) LOCK FLUSH 100 UNIT/ML IV SOLN
500.0000 [IU] | Freq: Once | INTRAVENOUS | Status: AC
  Administered 2020-01-18: 500 [IU] via INTRAVENOUS
  Filled 2020-01-18: qty 5

## 2020-01-18 NOTE — Progress Notes (Signed)
Hematology and Oncology Follow Up Visit  Brian [name] [id number] 28-Mar-1975 44 y.o. 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 is here today for post hospital follow-up and treatment. He developed SOB, fatigue and a rash on his face and arms. He was diagnosed with bilateral pulmonary emboli as well as a DVT in the left popliteal vein.  He started Lovenox SQ BID and is doing well.  No episodes of bleeding to report. No abnormal excessive bruising, no petechiae.   He still notes SOB with exertion but states that this is starting to improve.  He has occasional twinges of chest discomfort.  No fever, chills, n/v, cough, rash, dizziness, palpitations, abdominal pain or changes in bowel or bladder habits.  He takes a stool softener when needed for constipation.  Minimal swelling in the left ankle. Pedal pulses are 3+.  No tenderness in his extremities.  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  No new falls since being home. No syncope.  He states that he has a good appetite and is staying well hydrated.   ECOG Performance Status: 1 - Symptomatic but completely ambulatory  Medications:  Allergies as of 01/18/2020      Reactions   Dextromethorphan-guaifenesin Other (See Comments)   Irregular heartbeat   Doxycycline Anaphylaxis, Nausea And Vomiting, Rash, Other (See Comments)   "heart arrythmia" and "dyspepsia" (only oral doxycycline causes reaction)   Gadobutrol Hives, Other (See Comments)   Patient had MRI scan at  [place]. Patient called one hour after he left imaging facility to report two "blisters" that came up on "back" of lip.    Gadolinium Derivatives Hives   Patient had MRI scan at [place]. Patient called one hour after he left imaging facility to report two "blisters" that came up on "back" of lip.    Guaifenesin Palpitations      Ibuprofen Hives, Itching   Lisinopril Other (See Comments)   Angioedema   Pantoprazole Itching   Tramadol Hives, Itching   Barium Rash   Developed redness around neck after drinking 1st bottle of Barocat; pt was given Benedryl by ED Mds to be "on the safe side" before drinking 2nd bottle      Medication List       Accurate as of January 18, 2020  2:31 PM. If you have any questions, ask your nurse or doctor.        baclofen 10 MG tablet Commonly known as: LIORESAL TAKE 1 TABLET BY MOUTH THREE TIMES DAILY What changed:   when to take this  reasons to take this   cloNIDine 0.1 MG tablet Commonly known as: CATAPRES Take 0.1 mg by mouth 2 (two) times daily as needed (high blood pressure).   cyclobenzaprine 10 MG tablet Commonly known as: FLEXERIL Take 1 tablet (10 mg total) by mouth 3 (three) times daily as needed for muscle spasms.   enoxaparin 120 MG/0.8ML injection Commonly known as: LOVENOX Inject 0.8 mLs (120 mg total) into the skin  every 12 (twelve) hours.   EPINEPHrine 0.3 mg/0.3 mL Soaj injection Commonly known as: EpiPen 2-Pak USE AS DIRECTED FOR LIFE THREATENING ALLERGIC REACTIONS   furosemide 80 MG tablet Commonly known as: LASIX TAKE 1 TABLET (80 MG TOTAL) BY MOUTH DAILY. What changed:   when to take this  reasons to take this   gabapentin 300 MG capsule Commonly known as: NEURONTIN TAKE 1 CAPSULE (300 MG TOTAL) BY MOUTH 3 (THREE) TIMES DAILY.   Janssen COVID-19 Vaccine 0.5 ML injection Generic drug: COVID-19 Ad26 vaccine (JANSSEN/J&J)   levothyroxine 200 MCG tablet Commonly known as: SYNTHROID Take  200 mcg by mouth daily. Take along with 88 mcg=288 mcg   levothyroxine 88 MCG tablet Commonly known as: SYNTHROID Take 88 mcg by mouth daily before breakfast. Take along with 200 mcg=288 mcg   lidocaine-prilocaine cream Commonly known as: EMLA Apply to affected area once   LORazepam 0.5 MG tablet Commonly known as: ATIVAN TAKE 1 TABLET (0.5 MG TOTAL) BY MOUTH EVERY 6 (SIX) HOURS AS NEEDED (NAUSEA OR VOMITING).   losartan 25 MG tablet Commonly known as: COZAAR Take 1 tablet (25 mg total) by mouth 2 (two) times daily. What changed:   when to take this  reasons to take this   magic mouthwash Soln Take 10 mLs by mouth 4 (four) times daily as needed for mouth pain.   methylphenidate 10 MG tablet Commonly known as: RITALIN Take 1 tablet (10 mg total) by mouth 2 (two) times daily. What changed: when to take this   metoCLOPramide 10 MG tablet Commonly known as: REGLAN TAKE 1 TABLET BY MOUTH FOUR TIMES DAILY BEFORE MEALS AND AT BEDTIME What changed: See the new instructions.   metolazone 5 MG tablet Commonly known as: ZAROXOLYN TAKE 1 TABLET (5 MG TOTAL) BY MOUTH DAILY. TAKE 1 HOUR BEFORE LASIX.   multivitamin capsule Take 1 capsule by mouth daily.   oxyCODONE 20 mg 12 hr tablet Commonly known as: OXYCONTIN Take 1 tablet (20 mg total) by mouth every 12 (twelve) hours. What changed: Another medication with the same name was changed. Make sure you understand how and when to take each.   Oxycodone HCl 10 MG Tabs TAKE 1 TABLET (10 MG TOTAL) BY MOUTH EVERY 6 (SIX) HOURS AS NEEDED. What changed: reasons to take this   potassium chloride 10 MEQ tablet Commonly known as: KLOR-CON TAKE 1 TABLET (10 MEQ TOTAL) BY MOUTH DAILY.   prochlorperazine 10 MG tablet Commonly known as: COMPAZINE Take 1 tablet (10 mg total) by mouth every 6 (six) hours as needed (Nausea or vomiting).   sucralfate 1 GM/10ML suspension Commonly known as: Carafate Take 10 mLs (1 g total) by mouth 4  (four) times daily -  with meals and at bedtime. What changed:   when to take this  reasons to take this       Allergies:  Allergies  Allergen Reactions  . Dextromethorphan-Guaifenesin Other (See Comments)    Irregular heartbeat   . Doxycycline Anaphylaxis, Nausea And Vomiting, Rash and Other (See Comments)    "heart arrythmia" and "dyspepsia" (only oral doxycycline causes reaction)  . Gadobutrol Hives and Other (See Comments)    Patient had MRI scan at [place]. Patient called one hour after he left imaging facility to report two "blisters" that came up on "back" of lip.    . Gadolinium Derivatives Hives    Patient had MRI scan at [place]. Patient called one hour after he left imaging facility to report  two "blisters" that came up on "back" of lip.   . Guaifenesin Palpitations       . Ibuprofen Hives and Itching  . Lisinopril Other (See Comments)    Angioedema  . Pantoprazole Itching  . Tramadol Hives and Itching  . Barium Rash    Developed redness around neck after drinking 1st bottle of Barocat; pt was given Benedryl by ED Mds to be "on the safe side" before drinking 2nd bottle    Past Medical History, Surgical history, Social history, and Family History were reviewed and updated.  Review of Systems: All other 10 point review of systems is negative.   Physical Exam:  oral temperature is 98.5 F (36.9 C). His blood pressure is 131/84 and his pulse is 102 (abnormal). His respiration is 20.   Wt Readings from Last 3 Encounters:  01/15/20 281 lb 8.4 oz (127.7 kg)  01/04/20 288 lb (130.6 kg)  12/15/19 275 lb (124.7 kg)    Ocular: Sclerae unicteric, pupils equal, round and reactive to light Ear-nose-throat: Oropharynx clear, dentition fair Lymphatic: No cervical or supraclavicular adenopathy Lungs no rales or rhonchi, good excursion bilaterally Heart regular rate and rhythm, no murmur appreciated Abd soft, nontender, positive bowel sounds MSK  no focal spinal tenderness, no joint edema Neuro: non-focal, well-oriented, appropriate affect Breasts: Deferred   Lab Results  Component Value Date   WBC 2.9 (L) 01/18/2020   HGB 9.9 (L) 01/18/2020   HCT 30.4 (L) 01/18/2020   MCV 104.8 (H) 01/18/2020   PLT 178 01/18/2020   No results found for: FERRITIN, IRON, TIBC, UIBC, IRONPCTSAT Lab Results  Component Value Date   RETICCTPCT 5.3 (H) 01/18/2020   RBC 2.90 (L) 01/18/2020   RBC 2.94 (L) 01/18/2020   No results found for: KPAFRELGTCHN, LAMBDASER, KAPLAMBRATIO No results found for: IGGSERUM, IGA, IGMSERUM No results found for: Odetta Pink, SPEI   Chemistry      Component Value Date/Time   NA 136 01/18/2020 1340   NA 141 02/06/2019 1210   K 3.6 01/18/2020 1340   CL 101 01/18/2020 1340   CO2 29 01/18/2020 1340   BUN 21 (H) 01/18/2020 1340   BUN 8 02/06/2019 1210   CREATININE 0.97 01/18/2020 1340      Component Value Date/Time   CALCIUM 9.5 01/18/2020 1340   ALKPHOS 67 01/18/2020 1340   AST 21 01/18/2020 1340   ALT 26 01/18/2020 1340   BILITOT 0.3 01/18/2020 1340       Impression and Plan: 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  I went over his lab work with Dr. Marin Olp. With his decrease in WBC count to 2.9 (previously 4.6) and his just having gotten out of the hospital with bilateral PE's and left lower extremity DVT, we will hold treatment this week and plan to resume next week.  We will get him a new schedule today to reflect these changes.  He will continue his same regimen with Lovenox.  Iron studies are pending. We can replace if needed.  He was encouraged to contact our office with any questions or concerns.   Laverna Peace, NP 12/2/20212:31 PM

## 2020-01-19 ENCOUNTER — Encounter: Payer: Self-pay | Admitting: *Deleted

## 2020-01-19 ENCOUNTER — Other Ambulatory Visit: Payer: Self-pay | Admitting: *Deleted

## 2020-01-19 LAB — FERRITIN: Ferritin: 370 ng/mL — ABNORMAL HIGH (ref 24–336)

## 2020-01-19 LAB — IRON AND TIBC
Iron: 64 ug/dL (ref 42–163)
Saturation Ratios: 21 % (ref 20–55)
TIBC: 303 ug/dL (ref 202–409)
UIBC: 239 ug/dL (ref 117–376)

## 2020-01-19 LAB — SAMPLE TO BLOOD BANK

## 2020-01-19 NOTE — Patient Outreach (Signed)
[facility]) Care Management  01/19/2020  Brian [name] [birth year] [id number]  Transition of care call/case closure   Referral received:01/15/20 Initial outreach:01/16/20 Insurance: [place] SAVE   Subjective: Initial successful telephone call to patient's preferred number in order to complete transition of care assessment; 2 HIPAA identifiers verified. Explained purpose of call and completed transition of care assessment.  Brian [name] states that he is feeling tired. He discussed recent admission for pulmonary embolus and left lower extremity DVT. He reports having little shortness of breath, and little swelling in lower leg. He discussed having pain due to thyroid cancer managed with prn medications. Recent chemotherapy on yesterday. He reports tolerating mobility around the house taking it easy. He reports tolerating self Lovenox injections, no reports of bleeding noted. He tolerating diet  denies bowel or bladder problems.  Spouse is  assisting with his recovery.   Reviewed accessing the following [place] Benefits :  States he does not need a referral to one of the [place] chronic disease management programs.  He does  have the hospital indemnity plan and has called to file a claim.  Patient denies need for additional education or resource information at this time.  He uses a Company secretary outpatient pharmacy at [facility].      Objective:  [name], Left popliteal DVT 11/27-11/29/21. .  Comorbidities include: Metastatic thyroid cancer on chemotherapy, Hypertension GERD He was discharged to home on 01/15/20 without the need for home health servicesor DME.  Assessment:  Patient voices good understanding of all discharge instructions.  See transition of care flowsheet for assessment details.   Plan:  Reviewed hospital discharge diagnosis of Bilateral pulmonary embolus, Left lower extremity  DVT    and discharge treatment plan using hospital discharge instructions, assessing medication adherence, reviewing problems requiring provider notification, and discussing the importance of follow up with  primary care provider and/or specialists as directed.  Reviewed [place] healthy lifestyle program information to receive discounted premium for  2022   Step 1: Get  your annual physical  Step 2: Complete your health assessment  Step 3:Identify your current health status and complete the corresponding action step between January 1, and October 18, 2019.       No ongoing care management needs identified so will close case to [place] Management services.   Joylene Draft, RN, BSN  [place] Management Coordinator  [phone]- Mobile [phone]- Toll Free Main Office

## 2020-01-23 ENCOUNTER — Encounter (HOSPITAL_BASED_OUTPATIENT_CLINIC_OR_DEPARTMENT_OTHER): Payer: Self-pay

## 2020-01-23 ENCOUNTER — Other Ambulatory Visit: Payer: Self-pay

## 2020-01-23 ENCOUNTER — Emergency Department (HOSPITAL_BASED_OUTPATIENT_CLINIC_OR_DEPARTMENT_OTHER): Payer: 59

## 2020-01-23 ENCOUNTER — Telehealth: Payer: Self-pay | Admitting: *Deleted

## 2020-01-23 ENCOUNTER — Emergency Department (HOSPITAL_BASED_OUTPATIENT_CLINIC_OR_DEPARTMENT_OTHER)
Admission: EM | Admit: 2020-01-23 | Discharge: 2020-01-23 | Disposition: A | Discharge status: Home / Self Care | Payer: 59 | Attending: Emergency Medicine | Admitting: Emergency Medicine

## 2020-01-23 DIAGNOSIS — Z85850 Personal history of malignant neoplasm of thyroid: Secondary | ICD-10-CM | POA: Diagnosis not present

## 2020-01-23 DIAGNOSIS — R000 Tachycardia, unspecified: Secondary | ICD-10-CM | POA: Diagnosis not present

## 2020-01-23 DIAGNOSIS — R609 Edema, unspecified: Secondary | ICD-10-CM | POA: Insufficient documentation

## 2020-01-23 DIAGNOSIS — R0789 Other chest pain: Secondary | ICD-10-CM | POA: Insufficient documentation

## 2020-01-23 DIAGNOSIS — R110 Nausea: Secondary | ICD-10-CM | POA: Diagnosis not present

## 2020-01-23 DIAGNOSIS — R519 Headache, unspecified: Secondary | ICD-10-CM | POA: Diagnosis not present

## 2020-01-23 DIAGNOSIS — Z7901 Long term (current) use of anticoagulants: Secondary | ICD-10-CM | POA: Diagnosis not present

## 2020-01-23 DIAGNOSIS — I10 Essential (primary) hypertension: Secondary | ICD-10-CM | POA: Diagnosis not present

## 2020-01-23 DIAGNOSIS — I2699 Other pulmonary embolism without acute cor pulmonale: Secondary | ICD-10-CM | POA: Diagnosis not present

## 2020-01-23 DIAGNOSIS — Z79899 Other long term (current) drug therapy: Secondary | ICD-10-CM | POA: Diagnosis not present

## 2020-01-23 DIAGNOSIS — R0602 Shortness of breath: Secondary | ICD-10-CM | POA: Diagnosis not present

## 2020-01-23 LAB — CBC WITH DIFFERENTIAL/PLATELET
Abs Immature Granulocytes: 0.09 10*3/uL — ABNORMAL HIGH (ref 0.00–0.07)
Basophils Absolute: 0 10*3/uL (ref 0.0–0.1)
Basophils Relative: 1 %
Eosinophils Absolute: 0 10*3/uL (ref 0.0–0.5)
Eosinophils Relative: 0 %
HCT: 33.5 % — ABNORMAL LOW (ref 39.0–52.0)
Hemoglobin: 10.8 g/dL — ABNORMAL LOW (ref 13.0–17.0)
Immature Granulocytes: 3 %
Lymphocytes Relative: 15 %
Lymphs Abs: 0.5 10*3/uL — ABNORMAL LOW (ref 0.7–4.0)
MCH: 33.5 pg (ref 26.0–34.0)
MCHC: 32.2 g/dL (ref 30.0–36.0)
MCV: 104 fL — ABNORMAL HIGH (ref 80.0–100.0)
Monocytes Absolute: 0.7 10*3/uL (ref 0.1–1.0)
Monocytes Relative: 19 %
Neutro Abs: 2.3 10*3/uL (ref 1.7–7.7)
Neutrophils Relative %: 62 %
Platelets: 275 10*3/uL (ref 150–400)
RBC: 3.22 MIL/uL — ABNORMAL LOW (ref 4.22–5.81)
RDW: 16.3 % — ABNORMAL HIGH (ref 11.5–15.5)
WBC: 3.6 10*3/uL — ABNORMAL LOW (ref 4.0–10.5)
nRBC: 2 % — ABNORMAL HIGH (ref 0.0–0.2)

## 2020-01-23 LAB — COMPREHENSIVE METABOLIC PANEL
ALT: 43 U/L (ref 0–44)
AST: 37 U/L (ref 15–41)
Albumin: 3.9 g/dL (ref 3.5–5.0)
Alkaline Phosphatase: 62 U/L (ref 38–126)
Anion gap: 8 (ref 5–15)
BUN: 29 mg/dL — ABNORMAL HIGH (ref 6–20)
CO2: 25 mmol/L (ref 22–32)
Calcium: 9.1 mg/dL (ref 8.9–10.3)
Chloride: 102 mmol/L (ref 98–111)
Creatinine, Ser: 0.95 mg/dL (ref 0.61–1.24)
GFR, Estimated: >60 mL/min (ref >60)
Glucose, Bld: 150 mg/dL — ABNORMAL HIGH (ref 70–99)
Potassium: 4.3 mmol/L (ref 3.5–5.1)
Sodium: 135 mmol/L (ref 135–145)
Total Bilirubin: 0.5 mg/dL (ref 0.3–1.2)
Total Protein: 6 g/dL — ABNORMAL LOW (ref 6.5–8.1)

## 2020-01-23 LAB — TROPONIN I (HIGH SENSITIVITY)
Troponin I (High Sensitivity): 8 ng/L (ref <18)
Troponin I (High Sensitivity): 7 ng/L (ref <18)

## 2020-01-23 MED ORDER — IOHEXOL 350 MG/ML SOLN
100.0000 mL | Freq: Once | INTRAVENOUS | Status: AC
  Administered 2020-01-23: 100 mL via INTRAVENOUS

## 2020-01-23 MED ORDER — HYDROMORPHONE HCL 1 MG/ML IJ SOLN
1.0000 mg | Freq: Once | INTRAMUSCULAR | Status: AC
  Administered 2020-01-23: 1 mg via INTRAVENOUS
  Filled 2020-01-23: qty 1

## 2020-01-23 MED ORDER — HEPARIN SOD (PORK) LOCK FLUSH 100 UNIT/ML IV SOLN
INTRAVENOUS | Status: AC
  Filled 2020-01-23: qty 5

## 2020-01-23 MED ORDER — METOCLOPRAMIDE HCL 5 MG/ML IJ SOLN
10.0000 mg | Freq: Once | INTRAMUSCULAR | Status: AC
  Administered 2020-01-23: 10 mg via INTRAVENOUS
  Filled 2020-01-23: qty 2

## 2020-01-23 NOTE — ED Provider Notes (Signed)
[facility] EMERGENCY DEPARTMENT Provider Note   CSN: [id number] Arrival date & time: 01/23/20  1844     History Chief Complaint  Patient presents with  . Shortness of Breath    Brian [name] is a 44 y.o. male.  The history is provided by the patient and medical records.   Brian [name] is a 44 y.o. male who presents to the Emergency Department complaining of shortness of breath and chest pressure. He has a history of metastatic thyroid cancer and recent diagnosis of pulmonary embolism on BID Lovenox. Yesterday while watching TV he noticed that his heart rate was elevated to 120 multiple times. This morning he went to the kitchen with minimal activity he developed central chest pressure and his heart rate was elevated to 128. All day long he has noted that his heart rate has been elevated. He also reports left-sided head pressure described as a squeezing sensation. The head pressure is new.  Denies fever, cough, vomiting, diarrhea.  Mild nausea.      Past Medical History:  Diagnosis Date  . DVT, lower extremity, distal, acute, left ([place]) 01/14/2020  . Essential hypertension 09/09/2018  . Goals of care, counseling/discussion 01/14/2020  . Kidney stone   . Primary cancer of thyroid with metastasis to other site ([place]) 05/04/2018  . Rickettsia infection     Patient Active Problem List   Diagnosis Date Noted  . Goals of care, counseling/discussion 01/14/2020  . DVT, lower extremity, distal, acute, left ([place]) 01/14/2020  . Pulmonary embolism without acute cor pulmonale ([place]) 01/13/2020  . Essential hypertension 09/09/2018  . Primary cancer of thyroid with metastasis to other site ([place]) 05/04/2018  . Extrasystole 04/26/2018  . Dyspnea on exertion 04/26/2018  . Atypical chest pain 04/26/2018  . Ureteral stone with hydronephrosis 02/15/2018  . Right thyroid nodule 04/09/2017  . Bilateral knee pain 07/01/2016  . Left foot pain 02/06/2016  . Gastroesophageal reflux disease  without esophagitis 05/08/2014  . Thrombocytopenia, unspecified ([place]) 06/13/2013  . 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 Low back pain 05/12/2012  . Allergic rhinitis 06/03/2009    Past Surgical History:  Procedure Laterality Date  . BIOPSY  04/05/2018   Procedure: BIOPSY;  Surgeon: Ronald Lobo, MD;  Location: [facility];  Service: Endoscopy;;  . CHOLECYSTECTOMY    . ESOPHAGOGASTRODUODENOSCOPY (EGD) WITH PROPOFOL N/A 04/05/2018   Procedure: ESOPHAGOGASTRODUODENOSCOPY (EGD) WITH PROPOFOL;  Surgeon: Ronald Lobo, MD;  Location: [facility];  Service: Endoscopy;  Laterality: N/A;  . IR IMAGING GUIDED PORT INSERTION  09/13/2019  . IR RADIOLOGIST EVAL & MGMT  10/11/2018       Family History  Problem Relation Age of Onset  . Diabetes Mother   . Heart disease Mother   . Heart failure Mother   . Heart attack Mother   . Hyperlipidemia Mother   . Hypertension Mother   . Hyperlipidemia Father   . Allergic rhinitis Father   . Rashes / Skin problems Father   . Sudden death Neg Hx   . Thyroid disease Neg Hx     Social History   Tobacco Use  . Smoking status: Never Smoker  . Smokeless tobacco: Never Used  Vaping Use  . Vaping Use: Never used  Substance Use Topics  . Alcohol use: No  . Drug use: No    Home Medications Prior to Admission medications   Medication Sig Start Date End Date Taking? Authorizing Provider  baclofen (LIORESAL) 10 MG tablet TAKE 1 TABLET BY MOUTH THREE TIMES  DAILY Patient taking differently: Take 10 mg by mouth 3 (three) times daily as needed for muscle spasms.  01/13/20   [name], MD  cloNIDine (CATAPRES) 0.1 MG tablet Take 0.1 mg by mouth 2 (two) times daily as needed (high blood pressure).  09/06/19   [provider]  cyclobenzaprine (FLEXERIL) 10 MG tablet Take 1 tablet (10 mg total) by mouth 3 (three) times daily as needed for muscle spasms. 11/27/19   [name], MD  enoxaparin (LOVENOX) 120 MG/0.8ML injection Inject 0.8  mLs (120 mg total) into the skin every 12 (twelve) hours. 01/15/20 02/14/20  Barb Merino, MD  EPINEPHrine (EPIPEN 2-PAK) 0.3 mg/0.3 mL IJ SOAJ injection USE AS DIRECTED FOR LIFE THREATENING ALLERGIC REACTIONS 04/30/15   Kozlow, Donnamarie Poag, MD  furosemide (LASIX) 80 MG tablet TAKE 1 TABLET (80 MG TOTAL) BY MOUTH DAILY. Patient taking differently: Take 80 mg by mouth daily as needed for fluid.  11/24/19   [name], MD  gabapentin (NEURONTIN) 300 MG capsule TAKE 1 CAPSULE (300 MG TOTAL) BY MOUTH 3 (THREE) TIMES DAILY. 11/13/19   [name], MD  JANSSEN COVID-19 VACCINE 0.5 ML injection  11/14/19   [provider]  levothyroxine (SYNTHROID) 200 MCG tablet Take 200 mcg by mouth daily. Take along with 88 mcg=288 mcg 10/16/19   [provider]  levothyroxine (SYNTHROID) 88 MCG tablet Take 88 mcg by mouth daily before breakfast. Take along with 200 mcg=288 mcg 10/25/19   [provider]  lidocaine-prilocaine (EMLA) cream Apply to affected area once 12/05/19   Ennever, Rudell Cobb, MD  LORazepam (ATIVAN) 0.5 MG tablet TAKE 1 TABLET (0.5 MG TOTAL) BY MOUTH EVERY 6 (SIX) HOURS AS NEEDED (NAUSEA OR VOMITING). 01/13/20 04/12/20  [name], MD  losartan (COZAAR) 25 MG tablet Take 1 tablet (25 mg total) by mouth 2 (two) times daily. Patient taking differently: Take 25 mg by mouth 2 (two) times daily as needed (high blood pressure).  03/02/19   Park Liter, MD  magic mouthwash SOLN Take 10 mLs by mouth 4 (four) times daily as needed for mouth pain. 10/06/19   [name], MD  methylphenidate (RITALIN) 10 MG tablet Take 1 tablet (10 mg total) by mouth 2 (two) times daily. Patient taking differently: Take 10 mg by mouth daily.  11/27/19   [name], MD  metoCLOPramide (REGLAN) 10 MG tablet TAKE 1 TABLET BY MOUTH FOUR TIMES DAILY BEFORE MEALS AND AT BEDTIME Patient taking differently: Take 10 mg by mouth every 6 (six) hours as needed for nausea or vomiting.  06/01/19    [name], MD  metolazone (ZAROXOLYN) 5 MG tablet TAKE 1 TABLET (5 MG TOTAL) BY MOUTH DAILY. TAKE 1 HOUR BEFORE LASIX. 11/24/19   [name], MD  Multiple Vitamin (MULTIVITAMIN) capsule Take 1 capsule by mouth daily.    [provider]  oxyCODONE (OXYCONTIN) 20 mg 12 hr tablet Take 1 tablet (20 mg total) by mouth every 12 (twelve) hours. 11/27/19   [name], MD  Oxycodone HCl 10 MG TABS TAKE 1 TABLET (10 MG TOTAL) BY MOUTH EVERY 6 (SIX) HOURS AS NEEDED. Patient taking differently: Take 10 mg by mouth every 6 (six) hours as needed (pain).  01/02/20   [name], MD  potassium chloride (KLOR-CON) 10 MEQ tablet TAKE 1 TABLET (10 MEQ TOTAL) BY MOUTH DAILY. 11/13/19   [place], Holli Humbles, NP  prochlorperazine (COMPAZINE) 10 MG tablet Take 1 tablet (  10 mg total) by mouth every 6 (six) hours as needed (Nausea or vomiting). 12/05/19   [name], MD  sucralfate (CARAFATE) 1 GM/10ML suspension Take 10 mLs (1 g total) by mouth 4 (four) times daily -  with meals and at bedtime. Patient taking differently: Take 1 g by mouth 4 (four) times daily as needed (prevent ulcers).  10/05/19   [name], MD    Allergies    Dextromethorphan-guaifenesin, Doxycycline, Gadobutrol, Gadolinium derivatives, Guaifenesin, Ibuprofen, Lisinopril, Pantoprazole, Tramadol, and Barium  Review of Systems   Review of Systems  All other systems reviewed and are negative.   Physical Exam Updated Vital Signs BP 123/78   Pulse 96   Temp 98.5 F (36.9 C) (Oral)   Resp 18   Ht 5\' 10"  (1.778 m)   Wt 129.4 kg   SpO2 99%   BMI 40.92 kg/m   Physical Exam Vitals and nursing note reviewed.  Constitutional:      Appearance: He is well-developed.  HENT:     Head: Normocephalic and atraumatic.  Cardiovascular:     Rate and Rhythm: Regular rhythm. Tachycardia present.     Heart sounds: No murmur heard.   Pulmonary:     Effort: Pulmonary effort is normal. No respiratory distress.      Breath sounds: Normal breath sounds.  Abdominal:     Palpations: Abdomen is soft.     Tenderness: There is no abdominal tenderness. There is no guarding or rebound.  Musculoskeletal:        General: No tenderness.     Comments: Trace edema to bilateral lower extremities  Skin:    General: Skin is warm and dry.  Neurological:     Mental Status: He is alert and oriented to person, place, and time.  Psychiatric:        Behavior: Behavior normal.     ED Results / Procedures / Treatments   Labs (all labs ordered are listed, but only abnormal results are displayed) Labs Reviewed  CBC WITH DIFFERENTIAL/PLATELET - Abnormal; Notable for the following components:      Result Value   WBC 3.6 (*)    RBC 3.22 (*)    Hemoglobin 10.8 (*)    HCT 33.5 (*)    MCV 104.0 (*)    RDW 16.3 (*)    nRBC 2.0 (*)    Lymphs Abs 0.5 (*)    Abs Immature Granulocytes 0.09 (*)    All other components within normal limits  COMPREHENSIVE METABOLIC PANEL - Abnormal; Notable for the following components:   Glucose, Bld 150 (*)    BUN 29 (*)    Total Protein 6.0 (*)    All other components within normal limits  TROPONIN I (HIGH SENSITIVITY)  TROPONIN I (HIGH SENSITIVITY)    EKG None  Radiology CT Head Wo Contrast  Result Date: 01/23/2020 CLINICAL DATA:  Headache EXAM: CT HEAD WITHOUT CONTRAST TECHNIQUE: Contiguous axial images were obtained from the base of the skull through the vertex without intravenous contrast. COMPARISON:  None. FINDINGS: Brain: No evidence of acute territorial infarction, hemorrhage, hydrocephalus,extra-axial collection or mass lesion/mass effect. Normal gray-white differentiation. Ventricles are normal in size and contour. Vascular: No hyperdense vessel or unexpected calcification. Skull: The skull is intact. No fracture or focal lesion identified. Sinuses/Orbits: The visualized paranasal sinuses and mastoid air cells are clear. The orbits and globes intact. Other: None  IMPRESSION: No acute intracranial abnormality. Electronically Signed   By: Ebony Cargo.D.  On: 01/23/2020 21:04   CT Angio Chest PE W/Cm &/Or Wo Cm  Result Date: 01/23/2020 CLINICAL DATA:  Short of breath.  Concern pulmonary embolism EXAM: CT ANGIOGRAPHY CHEST WITH CONTRAST TECHNIQUE: Multidetector CT imaging of the chest was performed using the standard protocol during bolus administration of intravenous contrast. Multiplanar CT image reconstructions and MIPs were obtained to evaluate the vascular anatomy. CONTRAST:  171mL OMNIPAQUE IOHEXOL 350 MG/ML SOLN COMPARISON:  CT a 01/13/2020 FINDINGS: Cardiovascular: Interval resorption of the thromboemboli identified on prior CT 01/13/2020. No evidence of filling defects within the pulmonary arteries to suggest acute pulmonary embolism. Port in the anterior chest wall with tip in distal SVC. Mediastinum/Nodes: No axillary or supraclavicular adenopathy. No mediastinal or hilar adenopathy. No pericardial fluid. Esophagus normal. Low-density lesion in the RIGHT aspect of the thoracic inlet is again demonstrated measuring 4.8 x 2.5 cm compared with 4.2 x 2.4 cm on prior. No significant oval change Lungs/Pleura: No pulmonary infarction. No pulmonary nodularity. Airways normal. Upper Abdomen: Low-density lesion RIGHT hepatic lobe described on comparison CT is not well appreciated streak artifact from arms. Musculoskeletal: No aggressive osseous lesion. Review of the MIP images confirms the above findings. IMPRESSION: 1. No evidence acute pulmonary embolism. 2. Interval resorption of the thromboemboli identified on prior CT 01/13/2020. 3. No acute pulmonary parenchymal findings. 4. Stable low-density lesion in the RIGHT aspect of the thoracic inlet. 5. Low-density lesion in the liver not well appreciated. Electronically Signed   By: Suzy Bouchard M.D.   On: 01/23/2020 20:59    Procedures Procedures (including critical care time)  Medications Ordered in  ED Medications  heparin lock flush 100 UNIT/ML injection (has no administration in time range)  metoCLOPramide (REGLAN) injection 10 mg (10 mg Intravenous Given 01/23/20 1941)  iohexol (OMNIPAQUE) 350 MG/ML injection 100 mL (100 mLs Intravenous Contrast Given 01/23/20 2042)  HYDROmorphone (DILAUDID) injection 1 mg (1 mg Intravenous Given 01/23/20 2111)    ED Course  I have reviewed the triage vital signs and the nursing notes.  Pertinent labs & imaging results that were available during my care of the patient were reviewed by me and considered in my medical decision making (see chart for details).    MDM Rules/Calculators/A&P                         patient with history of metastatic thyroid cancer, recent diagnosis of PE on Lovenox here for evaluation of progressive shortness of breath as well as left-sided headache. He is tachycardic on presentation with no respiratory distress. CTA obtained, which is negative for PE. Labs with stable renal function, CBC. CT head obtained given report of headache, CT is negative for acute lesion. Headache is improved after treatment in the department. Plan to discharge home with outpatient follow-up and return precautions.  Final Clinical Impression(s) / ED Diagnoses Final diagnoses:  SOB (shortness of breath)  Bad headache    Rx / DC Orders ED Discharge Orders    None       Quintella Reichert, MD 01/23/20 2320

## 2020-01-23 NOTE — ED Triage Notes (Signed)
Pt arrives with c/o SOB starting around 1130 when he woke up. Pt reports that he did notice that his HR was high last night (from watch) ranging from 130 to 110. Pt reports having some chest pressure last night as well. Pt recently diagnosed with PE and DVT. Also c/o left sided headache. Denies taking anything for pain.

## 2020-01-23 NOTE — Telephone Encounter (Signed)
Received call from patient stating that his apple watch is indicating that his HR is higher than 120. And consistently above 120.   Progressively more SOB at rest.  Dr Marin Olp notified.  Advised patient to go to the ED to be evaluated.

## 2020-01-23 NOTE — ED Notes (Signed)
Pt on monitor and vitals cycling 

## 2020-01-25 ENCOUNTER — Inpatient Hospital Stay (HOSPITAL_BASED_OUTPATIENT_CLINIC_OR_DEPARTMENT_OTHER): Payer: 59 | Admitting: Hematology & Oncology

## 2020-01-25 ENCOUNTER — Telehealth: Payer: Self-pay | Admitting: Hematology & Oncology

## 2020-01-25 ENCOUNTER — Inpatient Hospital Stay: Payer: 59

## 2020-01-25 ENCOUNTER — Encounter: Payer: Self-pay | Admitting: Hematology & Oncology

## 2020-01-25 ENCOUNTER — Other Ambulatory Visit (HOSPITAL_BASED_OUTPATIENT_CLINIC_OR_DEPARTMENT_OTHER): Payer: Self-pay | Admitting: Hematology & Oncology

## 2020-01-25 ENCOUNTER — Other Ambulatory Visit: Payer: Self-pay | Admitting: Pharmacist

## 2020-01-25 ENCOUNTER — Other Ambulatory Visit: Payer: Self-pay

## 2020-01-25 VITALS — HR 99

## 2020-01-25 DIAGNOSIS — R5383 Other fatigue: Secondary | ICD-10-CM | POA: Diagnosis not present

## 2020-01-25 DIAGNOSIS — I82432 Acute embolism and thrombosis of left popliteal vein: Secondary | ICD-10-CM | POA: Diagnosis not present

## 2020-01-25 DIAGNOSIS — R000 Tachycardia, unspecified: Secondary | ICD-10-CM | POA: Diagnosis not present

## 2020-01-25 DIAGNOSIS — C73 Malignant neoplasm of thyroid gland: Secondary | ICD-10-CM

## 2020-01-25 DIAGNOSIS — R0602 Shortness of breath: Secondary | ICD-10-CM | POA: Diagnosis not present

## 2020-01-25 DIAGNOSIS — R42 Dizziness and giddiness: Secondary | ICD-10-CM | POA: Diagnosis not present

## 2020-01-25 DIAGNOSIS — C787 Secondary malignant neoplasm of liver and intrahepatic bile duct: Secondary | ICD-10-CM | POA: Diagnosis not present

## 2020-01-25 DIAGNOSIS — Z5111 Encounter for antineoplastic chemotherapy: Secondary | ICD-10-CM | POA: Diagnosis not present

## 2020-01-25 DIAGNOSIS — M130 Polyarthritis, unspecified: Secondary | ICD-10-CM | POA: Diagnosis not present

## 2020-01-25 DIAGNOSIS — I2699 Other pulmonary embolism without acute cor pulmonale: Secondary | ICD-10-CM | POA: Diagnosis not present

## 2020-01-25 LAB — CMP (CANCER CENTER ONLY)
ALT: 41 U/L (ref 0–44)
AST: 26 U/L (ref 15–41)
Albumin: 3.9 g/dL (ref 3.5–5.0)
Alkaline Phosphatase: 60 U/L (ref 38–126)
Anion gap: 8 (ref 5–15)
BUN: 23 mg/dL — ABNORMAL HIGH (ref 6–20)
CO2: 28 mmol/L (ref 22–32)
Calcium: 9.3 mg/dL (ref 8.9–10.3)
Chloride: 104 mmol/L (ref 98–111)
Creatinine: 1.03 mg/dL (ref 0.61–1.24)
GFR, Estimated: >60 mL/min (ref >60)
Glucose, Bld: 157 mg/dL — ABNORMAL HIGH (ref 70–99)
Potassium: 3.7 mmol/L (ref 3.5–5.1)
Sodium: 140 mmol/L (ref 135–145)
Total Bilirubin: 0.7 mg/dL (ref 0.3–1.2)
Total Protein: 5.7 g/dL — ABNORMAL LOW (ref 6.5–8.1)

## 2020-01-25 LAB — CBC WITH DIFFERENTIAL (CANCER CENTER ONLY)
Abs Immature Granulocytes: 0.1 10*3/uL — ABNORMAL HIGH (ref 0.00–0.07)
Basophils Absolute: 0 10*3/uL (ref 0.0–0.1)
Basophils Relative: 1 %
Eosinophils Absolute: 0 10*3/uL (ref 0.0–0.5)
Eosinophils Relative: 0 %
HCT: 33.8 % — ABNORMAL LOW (ref 39.0–52.0)
Hemoglobin: 10.7 g/dL — ABNORMAL LOW (ref 13.0–17.0)
Immature Granulocytes: 3 %
Lymphocytes Relative: 20 %
Lymphs Abs: 0.7 10*3/uL (ref 0.7–4.0)
MCH: 33.3 pg (ref 26.0–34.0)
MCHC: 31.7 g/dL (ref 30.0–36.0)
MCV: 105.3 fL — ABNORMAL HIGH (ref 80.0–100.0)
Monocytes Absolute: 0.8 10*3/uL (ref 0.1–1.0)
Monocytes Relative: 22 %
Neutro Abs: 2 10*3/uL (ref 1.7–7.7)
Neutrophils Relative %: 54 %
Platelet Count: 233 10*3/uL (ref 150–400)
RBC: 3.21 MIL/uL — ABNORMAL LOW (ref 4.22–5.81)
RDW: 16.5 % — ABNORMAL HIGH (ref 11.5–15.5)
WBC Count: 3.6 10*3/uL — ABNORMAL LOW (ref 4.0–10.5)
nRBC: 1.4 % — ABNORMAL HIGH (ref 0.0–0.2)

## 2020-01-25 LAB — SAMPLE TO BLOOD BANK

## 2020-01-25 MED ORDER — PALONOSETRON HCL INJECTION 0.25 MG/5ML
0.2500 mg | Freq: Once | INTRAVENOUS | Status: AC
  Administered 2020-01-25: 0.25 mg via INTRAVENOUS

## 2020-01-25 MED ORDER — SODIUM CHLORIDE 0.9% FLUSH
10.0000 mL | INTRAVENOUS | Status: DC | PRN
  Administered 2020-01-25: 10 mL
  Filled 2020-01-25: qty 10

## 2020-01-25 MED ORDER — OXYCODONE HCL 10 MG PO TABS: 10.0000 mg | ORAL_TABLET | Freq: Four times a day (QID) | ORAL | 0 refills | Status: DC | PRN

## 2020-01-25 MED ORDER — PALONOSETRON HCL INJECTION 0.25 MG/5ML
INTRAVENOUS | Status: AC
  Filled 2020-01-25: qty 5

## 2020-01-25 MED ORDER — APIXABAN 5 MG PO TABS: 5.0000 mg | ORAL_TABLET | Freq: Two times a day (BID) | ORAL | 6 refills | Status: DC

## 2020-01-25 MED ORDER — DOXORUBICIN HCL CHEMO IV INJECTION 2 MG/ML
25.0000 mg/m2 | Freq: Once | INTRAVENOUS | Status: AC
  Administered 2020-01-25: 62 mg via INTRAVENOUS
  Filled 2020-01-25: qty 31

## 2020-01-25 MED ORDER — HEPARIN SOD (PORK) LOCK FLUSH 100 UNIT/ML IV SOLN
500.0000 [IU] | Freq: Once | INTRAVENOUS | Status: AC | PRN
  Administered 2020-01-25: 500 [IU]
  Filled 2020-01-25: qty 5

## 2020-01-25 MED ORDER — OXYCODONE HCL ER 20 MG PO T12A: 20.0000 mg | EXTENDED_RELEASE_TABLET | Freq: Two times a day (BID) | ORAL | 0 refills | Status: DC

## 2020-01-25 MED ORDER — SODIUM CHLORIDE 0.9 % IV SOLN
10.0000 mg | Freq: Once | INTRAVENOUS | Status: AC
  Administered 2020-01-25: 10 mg via INTRAVENOUS
  Filled 2020-01-25: qty 10

## 2020-01-25 MED ORDER — LORAZEPAM 0.5 MG PO TABS: 0.5000 mg | ORAL_TABLET | Freq: Four times a day (QID) | ORAL | 0 refills | Status: DC | PRN

## 2020-01-25 MED ORDER — SODIUM CHLORIDE 0.9 % IV SOLN
Freq: Once | INTRAVENOUS | Status: AC
  Filled 2020-01-25: qty 250

## 2020-01-25 NOTE — Progress Notes (Signed)
Hematology and Oncology Follow Up Visit  Brian [name] [id number] [birth year] 44 y.o. 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 is here today for follow-up.  He had to go the emergency room a day or so ago.  He was having tachycardia.  He has had some chest wall discomfort.  He had a CT angiogram of the chest.  This showed resolution of his pulmonary emboli.  I am very happy about this.  He has been on the Lovenox.  I think we now switch him over to Eliquis.  He has had no problems with nausea or vomiting.  Has had no problems with leg swelling although this has occurred on occasion.  He has not taken his diuretics.  He will start taking these again.  He has had no bleeding.  He has had no headache.  He has had no dysphagia or odontophagia.  Overall, I would say his performance status right now is ECOG 1.    Medications:  Allergies as of 01/25/2020      Reactions   Dextromethorphan-guaifenesin Other (See Comments)   Irregular heartbeat   Doxycycline Anaphylaxis, Nausea And Vomiting, Rash, Other (See Comments)   "heart arrythmia" and "dyspepsia" (only oral doxycycline causes reaction)   Gadobutrol Hives, Other (See Comments)   Patient had MRI scan at [place]. Patient called one hour after he left imaging facility to report two "blisters" that came up on "back" of lip.    Gadolinium Derivatives Hives   Patient had MRI scan at [place]. Patient called one hour after he left imaging facility  to report two "blisters" that came up on "back" of lip.    Guaifenesin Palpitations      Ibuprofen Hives, Itching   Lisinopril Other (See Comments)   Angioedema   Pantoprazole Itching   Tramadol Hives, Itching   Barium Rash   Developed redness around neck after drinking 1st bottle of Barocat; pt was given Benedryl by ED Mds to be "on the safe side" before drinking 2nd bottle      Medication List       Accurate as of January 25, 2020  2:15 PM. If you have any questions, ask your nurse or doctor.        baclofen 10 MG tablet Commonly known as: LIORESAL TAKE 1 TABLET BY MOUTH THREE TIMES DAILY What changed:   when to take this  reasons to take this   cloNIDine 0.1 MG tablet Commonly known as: CATAPRES Take 0.1 mg by mouth 2 (two) times daily as needed (high blood pressure).   cyclobenzaprine 10 MG tablet Commonly known as: FLEXERIL Take 1 tablet (10 mg total) by mouth 3 (three) times daily as needed for muscle spasms.   enoxaparin 120 MG/0.8ML injection Commonly known as: LOVENOX Inject 0.8 mLs (120 mg total) into the skin every 12 (twelve) hours.   EPINEPHrine 0.3 mg/0.3 mL Soaj injection Commonly known as: EpiPen 2-Pak USE AS DIRECTED FOR LIFE THREATENING ALLERGIC  REACTIONS   furosemide 80 MG tablet Commonly known as: LASIX TAKE 1 TABLET (80 MG TOTAL) BY MOUTH DAILY. What changed:   when to take this  reasons to take this   gabapentin 300 MG capsule Commonly known as: NEURONTIN TAKE 1 CAPSULE (300 MG TOTAL) BY MOUTH 3 (THREE) TIMES DAILY.   Janssen COVID-19 Vaccine 0.5 ML injection Generic drug: COVID-19 Ad26 vaccine (JANSSEN/J&J)   levothyroxine 200 MCG tablet Commonly known as: SYNTHROID Take 200 mcg by mouth daily. Take along with 88 mcg=288 mcg   levothyroxine 88 MCG tablet Commonly known as: SYNTHROID Take 88 mcg by mouth daily before breakfast. Take along with 200 mcg=288 mcg   lidocaine-prilocaine cream Commonly known as: EMLA Apply to  affected area once   LORazepam 0.5 MG tablet Commonly known as: ATIVAN TAKE 1 TABLET (0.5 MG TOTAL) BY MOUTH EVERY 6 (SIX) HOURS AS NEEDED (NAUSEA OR VOMITING).   losartan 25 MG tablet Commonly known as: COZAAR Take 1 tablet (25 mg total) by mouth 2 (two) times daily. What changed:   when to take this  reasons to take this   magic mouthwash Soln Take 10 mLs by mouth 4 (four) times daily as needed for mouth pain.   methylphenidate 10 MG tablet Commonly known as: RITALIN Take 1 tablet (10 mg total) by mouth 2 (two) times daily. What changed: when to take this   metoCLOPramide 10 MG tablet Commonly known as: REGLAN TAKE 1 TABLET BY MOUTH FOUR TIMES DAILY BEFORE MEALS AND AT BEDTIME What changed: See the new instructions.   metolazone 5 MG tablet Commonly known as: ZAROXOLYN TAKE 1 TABLET (5 MG TOTAL) BY MOUTH DAILY. TAKE 1 HOUR BEFORE LASIX.   multivitamin capsule Take 1 capsule by mouth daily.   OLANZapine 10 MG tablet Commonly known as: ZYPREXA Take 10 mg by mouth at bedtime.   oxyCODONE 20 mg 12 hr tablet Commonly known as: OXYCONTIN Take 1 tablet (20 mg total) by mouth every 12 (twelve) hours. What changed: Another medication with the same name was changed. Make sure you understand how and when to take each.   Oxycodone HCl 10 MG Tabs TAKE 1 TABLET (10 MG TOTAL) BY MOUTH EVERY 6 (SIX) HOURS AS NEEDED. What changed: reasons to take this   potassium chloride 10 MEQ tablet Commonly known as: KLOR-CON TAKE 1 TABLET (10 MEQ TOTAL) BY MOUTH DAILY.   prochlorperazine 10 MG tablet Commonly known as: COMPAZINE Take 1 tablet (10 mg total) by mouth every 6 (six) hours as needed (Nausea or vomiting).   sucralfate 1 GM/10ML suspension Commonly known as: Carafate Take 10 mLs (1 g total) by mouth 4 (four) times daily -  with meals and at bedtime. What changed:   when to take this  reasons to take this       Allergies:  Allergies  Allergen Reactions  .  Dextromethorphan-Guaifenesin Other (See Comments)    Irregular heartbeat   . Doxycycline Anaphylaxis, Nausea And Vomiting, Rash and Other (See Comments)    "heart arrythmia" and "dyspepsia" (only oral doxycycline causes reaction)  . Gadobutrol Hives and Other (See Comments)    Patient had MRI scan at [place]. Patient called one hour after he left imaging facility to report two "blisters" that came up on "back" of lip.    . Gadolinium Derivatives Hives    Patient had MRI scan at [place]. Patient called one hour after he left imaging facility to report two "blisters" that came up on "back"  of lip.   . Guaifenesin Palpitations       . Ibuprofen Hives and Itching  . Lisinopril Other (See Comments)    Angioedema  . Pantoprazole Itching  . Tramadol Hives and Itching  . Barium Rash    Developed redness around neck after drinking 1st bottle of Barocat; pt was given Benedryl by ED Mds to be "on the safe side" before drinking 2nd bottle    Past Medical History, Surgical history, Social history, and Family History were reviewed and updated.  Review of Systems: Review of Systems  Constitutional: Positive for malaise/fatigue.  HENT: Negative.   Eyes: Negative.   Respiratory: Negative.   Cardiovascular: Positive for chest pain, palpitations and leg swelling.  Gastrointestinal: Positive for abdominal pain.  Genitourinary: Negative.   Musculoskeletal: Negative.   Skin: Negative.   Neurological: Negative.   Endo/Heme/Allergies: Negative.   Psychiatric/Behavioral: Negative.      Physical Exam:  height is _0  (1.778 m) and weight is 286 lb (129.7 kg). His oral temperature is 98.1 F (36.7 C). His blood pressure is 157/95 (abnormal) and his pulse is 115 (abnormal). His respiration is 18 and oxygen saturation is 100%.   Wt Readings from Last 3 Encounters:  01/25/20 286 lb (129.7 kg)  01/23/20 285 lb 3.2 oz (129.4 kg)  01/15/20 281 lb 8.4 oz (127.7 kg)    Physical  Exam Vitals reviewed.  HENT:     Head: Normocephalic and atraumatic.     Mouth/Throat:     Mouth: Oropharynx is clear and moist.  Eyes:     Extraocular Movements: EOM normal.     Pupils: Pupils are equal, round, and reactive to light.  Cardiovascular:     Rate and Rhythm: Normal rate and regular rhythm.     Heart sounds: Normal heart sounds.  Pulmonary:     Effort: Pulmonary effort is normal.     Breath sounds: Normal breath sounds.  Abdominal:     General: Bowel sounds are normal.     Palpations: Abdomen is soft.  Musculoskeletal:        General: No tenderness, deformity or edema. Normal range of motion.     Cervical back: Normal range of motion.  Lymphadenopathy:     Cervical: No cervical adenopathy.  Skin:    General: Skin is warm and dry.     Findings: No erythema or rash.  Neurological:     Mental Status: He is alert and oriented to person, place, and time.  Psychiatric:        Mood and Affect: Mood and affect normal.        Behavior: Behavior normal.        Thought Content: Thought content normal.        Judgment: Judgment normal.      Lab Results  Component Value Date   WBC 3.6 (L) 01/25/2020   HGB 10.7 (L) 01/25/2020   HCT 33.8 (L) 01/25/2020   MCV 105.3 (H) 01/25/2020   PLT 233 01/25/2020   Lab Results  Component Value Date   FERRITIN 370 (H) 01/18/2020   IRON 64 01/18/2020   TIBC 303 01/18/2020   UIBC 239 01/18/2020   IRONPCTSAT 21 01/18/2020   Lab Results  Component Value Date   RETICCTPCT 5.3 (H) 01/18/2020   RBC 3.21 (L) 01/25/2020   No results found for: KPAFRELGTCHN, LAMBDASER, KAPLAMBRATIO No results found for: IGGSERUM, IGA, IGMSERUM No results found for: TOTALPROTELP, ALBUMINELP, A1GS, A2GS, BETS, BETA2SER, [place], MSPIKE, SPEI  Chemistry      Component Value Date/Time   NA 140 01/25/2020 1154   NA 141 02/06/2019 1210   K 3.7 01/25/2020 1154   CL 104 01/25/2020 1154   CO2 28 01/25/2020 1154   BUN 23 (H) 01/25/2020 1154   BUN 8  02/06/2019 1210   CREATININE 1.03 01/25/2020 1154      Component Value Date/Time   CALCIUM 9.3 01/25/2020 1154   ALKPHOS 60 01/25/2020 1154   AST 26 01/25/2020 1154   ALT 41 01/25/2020 1154   BILITOT 0.7 01/25/2020 1154       Impression and Plan: 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   I am happy that the pulmonary emboli are gone.  At some point, we will have to check a Doppler of his left leg.  I think we will go ahead with his treatment.  His blood counts look pretty good.  We will try to get 3 more weeks and and then get our scans taken care of.  Hopefully, we will see that he is responding.  We will go ahead and refill his medications today.  Hopefully, he will get his diuretic and this will help with some of the leg swelling.  He and I had a very good prayer session.  His faith remains quite strong.  I know he is getting tired.  He still is quite motivated.  He has wonderful family support.  I will plan to get him back to see me in early January.  [name], MD 12/9/20212:15 PM

## 2020-01-25 NOTE — Progress Notes (Signed)
Pt discharged in no apparent distress. Pt left ambulatory without assistance. Pt aware of discharge instructions and verbalized understanding and had no further questions.  

## 2020-01-25 NOTE — Patient Instructions (Signed)
  [facility] Discharge Instructions for Patients Receiving Chemotherapy  Today you received the following chemotherapy agents Doxorubicin  To help prevent nausea and vomiting after your treatment, we encourage you to take your nausea medication as prescribed by MD.   If you develop nausea and vomiting that is not controlled by your nausea medication, call the clinic.   BELOW ARE SYMPTOMS THAT SHOULD BE REPORTED IMMEDIATELY:  *FEVER GREATER THAN 100.5 F  *CHILLS WITH OR WITHOUT FEVER  NAUSEA AND VOMITING THAT IS NOT CONTROLLED WITH YOUR NAUSEA MEDICATION  *UNUSUAL SHORTNESS OF BREATH  *UNUSUAL BRUISING OR BLEEDING  TENDERNESS IN MOUTH AND THROAT WITH OR WITHOUT PRESENCE OF ULCERS  *URINARY PROBLEMS  *BOWEL PROBLEMS  UNUSUAL RASH Items with * indicate a potential emergency and should be followed up as soon as possible.  Feel free to call the clinic should you have any questions or concerns. The clinic phone number is [phone].  Please show the [facility] at check-in to the Emergency Department and triage nurse.

## 2020-01-25 NOTE — Telephone Encounter (Signed)
Appointments scheduled calendar printed per 12/9 los

## 2020-01-29 ENCOUNTER — Ambulatory Visit (HOSPITAL_COMMUNITY)
Admission: RE | Admit: 2020-01-29 | Discharge: 2020-01-29 | Disposition: A | Discharge status: Home / Self Care | Payer: 59 | Source: Ambulatory Visit | Attending: Hematology & Oncology | Admitting: Hematology & Oncology

## 2020-01-29 ENCOUNTER — Encounter (HOSPITAL_COMMUNITY): Payer: Self-pay

## 2020-01-29 ENCOUNTER — Other Ambulatory Visit: Payer: Self-pay | Admitting: Hematology & Oncology

## 2020-01-29 ENCOUNTER — Other Ambulatory Visit: Payer: Self-pay

## 2020-01-29 DIAGNOSIS — C787 Secondary malignant neoplasm of liver and intrahepatic bile duct: Secondary | ICD-10-CM | POA: Diagnosis not present

## 2020-01-29 DIAGNOSIS — C73 Malignant neoplasm of thyroid gland: Secondary | ICD-10-CM | POA: Diagnosis not present

## 2020-01-29 DIAGNOSIS — R590 Localized enlarged lymph nodes: Secondary | ICD-10-CM | POA: Diagnosis not present

## 2020-01-29 MED ORDER — GADOBUTROL 1 MMOL/ML IV SOLN
10.0000 mL | Freq: Once | INTRAVENOUS | Status: AC | PRN
  Administered 2020-01-29: 10 mL via INTRAVENOUS

## 2020-01-29 MED ORDER — SODIUM CHLORIDE (PF) 0.9 % IJ SOLN
INTRAMUSCULAR | Status: AC
  Filled 2020-01-29: qty 50

## 2020-01-31 ENCOUNTER — Other Ambulatory Visit (HOSPITAL_BASED_OUTPATIENT_CLINIC_OR_DEPARTMENT_OTHER): Payer: Self-pay | Admitting: Hematology & Oncology

## 2020-01-31 ENCOUNTER — Inpatient Hospital Stay: Payer: 59

## 2020-01-31 ENCOUNTER — Other Ambulatory Visit: Payer: Self-pay

## 2020-01-31 DIAGNOSIS — C73 Malignant neoplasm of thyroid gland: Secondary | ICD-10-CM

## 2020-01-31 DIAGNOSIS — C787 Secondary malignant neoplasm of liver and intrahepatic bile duct: Secondary | ICD-10-CM | POA: Diagnosis not present

## 2020-01-31 DIAGNOSIS — R42 Dizziness and giddiness: Secondary | ICD-10-CM | POA: Diagnosis not present

## 2020-01-31 DIAGNOSIS — M130 Polyarthritis, unspecified: Secondary | ICD-10-CM

## 2020-01-31 DIAGNOSIS — R0602 Shortness of breath: Secondary | ICD-10-CM | POA: Diagnosis not present

## 2020-01-31 DIAGNOSIS — I82432 Acute embolism and thrombosis of left popliteal vein: Secondary | ICD-10-CM | POA: Diagnosis not present

## 2020-01-31 DIAGNOSIS — R000 Tachycardia, unspecified: Secondary | ICD-10-CM | POA: Diagnosis not present

## 2020-01-31 DIAGNOSIS — R5383 Other fatigue: Secondary | ICD-10-CM | POA: Diagnosis not present

## 2020-01-31 DIAGNOSIS — I2699 Other pulmonary embolism without acute cor pulmonale: Secondary | ICD-10-CM | POA: Diagnosis not present

## 2020-01-31 DIAGNOSIS — Z5111 Encounter for antineoplastic chemotherapy: Secondary | ICD-10-CM | POA: Diagnosis not present

## 2020-01-31 LAB — CBC WITH DIFFERENTIAL/PLATELET
Abs Immature Granulocytes: 0.04 10*3/uL (ref 0.00–0.07)
Basophils Absolute: 0 10*3/uL (ref 0.0–0.1)
Basophils Relative: 1 %
Eosinophils Absolute: 0 10*3/uL (ref 0.0–0.5)
Eosinophils Relative: 0 %
HCT: 30.8 % — ABNORMAL LOW (ref 39.0–52.0)
Hemoglobin: 9.9 g/dL — ABNORMAL LOW (ref 13.0–17.0)
Immature Granulocytes: 1 %
Lymphocytes Relative: 16 %
Lymphs Abs: 0.9 10*3/uL (ref 0.7–4.0)
MCH: 32.8 pg (ref 26.0–34.0)
MCHC: 32.1 g/dL (ref 30.0–36.0)
MCV: 102 fL — ABNORMAL HIGH (ref 80.0–100.0)
Monocytes Absolute: 0.4 10*3/uL (ref 0.1–1.0)
Monocytes Relative: 7 %
Neutro Abs: 4.1 10*3/uL (ref 1.7–7.7)
Neutrophils Relative %: 75 %
Platelets: 213 10*3/uL (ref 150–400)
RBC: 3.02 MIL/uL — ABNORMAL LOW (ref 4.22–5.81)
RDW: 16 % — ABNORMAL HIGH (ref 11.5–15.5)
WBC: 5.4 10*3/uL (ref 4.0–10.5)
nRBC: 0 % (ref 0.0–0.2)

## 2020-01-31 LAB — COMPREHENSIVE METABOLIC PANEL
ALT: 21 U/L (ref 0–44)
AST: 17 U/L (ref 15–41)
Albumin: 3.7 g/dL (ref 3.5–5.0)
Alkaline Phosphatase: 74 U/L (ref 38–126)
Anion gap: 8 (ref 5–15)
BUN: 38 mg/dL — ABNORMAL HIGH (ref 6–20)
CO2: 30 mmol/L (ref 22–32)
Calcium: 9.1 mg/dL (ref 8.9–10.3)
Chloride: 99 mmol/L (ref 98–111)
Creatinine, Ser: 1.34 mg/dL — ABNORMAL HIGH (ref 0.61–1.24)
GFR, Estimated: >60 mL/min (ref >60)
Glucose, Bld: 90 mg/dL (ref 70–99)
Potassium: 3.6 mmol/L (ref 3.5–5.1)
Sodium: 137 mmol/L (ref 135–145)
Total Bilirubin: 0.3 mg/dL (ref 0.3–1.2)
Total Protein: 5.4 g/dL — ABNORMAL LOW (ref 6.5–8.1)

## 2020-01-31 MED ORDER — PALONOSETRON HCL INJECTION 0.25 MG/5ML
INTRAVENOUS | Status: AC
  Filled 2020-01-31: qty 5

## 2020-01-31 MED ORDER — PALONOSETRON HCL INJECTION 0.25 MG/5ML
0.2500 mg | Freq: Once | INTRAVENOUS | Status: AC
  Administered 2020-01-31: 0.25 mg via INTRAVENOUS

## 2020-01-31 MED ORDER — SODIUM CHLORIDE 0.9 % IV SOLN
10.0000 mg | Freq: Once | INTRAVENOUS | Status: AC
  Administered 2020-01-31: 10 mg via INTRAVENOUS
  Filled 2020-01-31: qty 10

## 2020-01-31 MED ORDER — AZITHROMYCIN 250 MG PO TABS: ORAL_TABLET | ORAL | 1 refills | Status: DC

## 2020-01-31 MED ORDER — CYCLOBENZAPRINE HCL 10 MG PO TABS: 10.0000 mg | ORAL_TABLET | Freq: Three times a day (TID) | ORAL | 3 refills | Status: DC | PRN

## 2020-01-31 MED ORDER — DOXORUBICIN HCL CHEMO IV INJECTION 2 MG/ML
25.0000 mg/m2 | Freq: Once | INTRAVENOUS | Status: AC
Start: 2020-01-31 — End: 2020-01-31
  Administered 2020-01-31: 62 mg via INTRAVENOUS
  Filled 2020-01-31: qty 31

## 2020-01-31 MED ORDER — SODIUM CHLORIDE 0.9% FLUSH
10.0000 mL | INTRAVENOUS | Status: DC | PRN
  Administered 2020-01-31: 10 mL
  Filled 2020-01-31: qty 10

## 2020-01-31 MED ORDER — SODIUM CHLORIDE 0.9 % IV SOLN
Freq: Once | INTRAVENOUS | Status: AC
  Filled 2020-01-31: qty 250

## 2020-01-31 MED ORDER — HEPARIN SOD (PORK) LOCK FLUSH 100 UNIT/ML IV SOLN
500.0000 [IU] | Freq: Once | INTRAVENOUS | Status: AC | PRN
  Administered 2020-01-31: 500 [IU]
  Filled 2020-01-31: qty 5

## 2020-01-31 NOTE — Progress Notes (Signed)
Ok to treat with HR > 100 per Dr. Marin Olp.  Hardie Pulley, PharmD, BCPS, BCOP

## 2020-01-31 NOTE — Patient Instructions (Signed)
  [facility] Discharge Instructions for Patients Receiving Chemotherapy  Today you received the following chemotherapy agents Doxorubicin  To help prevent nausea and vomiting after your treatment, we encourage you to take your nausea medication as prescribed by MD.   If you develop nausea and vomiting that is not controlled by your nausea medication, call the clinic.   BELOW ARE SYMPTOMS THAT SHOULD BE REPORTED IMMEDIATELY:  *FEVER GREATER THAN 100.5 F  *CHILLS WITH OR WITHOUT FEVER  NAUSEA AND VOMITING THAT IS NOT CONTROLLED WITH YOUR NAUSEA MEDICATION  *UNUSUAL SHORTNESS OF BREATH  *UNUSUAL BRUISING OR BLEEDING  TENDERNESS IN MOUTH AND THROAT WITH OR WITHOUT PRESENCE OF ULCERS  *URINARY PROBLEMS  *BOWEL PROBLEMS  UNUSUAL RASH Items with * indicate a potential emergency and should be followed up as soon as possible.  Feel free to call the clinic should you have any questions or concerns. The clinic phone number is [phone].  Please show the [facility] at check-in to the Emergency Department and triage nurse.

## 2020-01-31 NOTE — Patient Instructions (Signed)
Implanted Port Insertion, Care After °This sheet gives you information about how to care for yourself after your procedure. Your health care provider may also give you more specific instructions. If you have problems or questions, contact your health care provider. °What can I expect after the procedure? °After the procedure, it is common to have: °· Discomfort at the port insertion site. °· Bruising on the skin over the port. This should improve over 3-4 days. °Follow these instructions at home: °Port care °· After your port is placed, you will get a manufacturer's information card. The card has information about your port. Keep this card with you at all times. °· Take care of the port as told by your health care provider. Ask your health care provider if you or a family member can get training for taking care of the port at home. A home health care nurse may also take care of the port. °· Make sure to remember what type of port you have. °Incision care ° °  ° °· Follow instructions from your health care provider about how to take care of your port insertion site. Make sure you: °? Wash your hands with soap and water before and after you change your bandage (dressing). If soap and water are not available, use hand sanitizer. °? Change your dressing as told by your health care provider. °? Leave stitches (sutures), skin glue, or adhesive strips in place. These skin closures may need to stay in place for 2 weeks or longer. If adhesive strip edges start to loosen and curl up, you may trim the loose edges. Do not remove adhesive strips completely unless your health care provider tells you to do that. °· Check your port insertion site every day for signs of infection. Check for: °? Redness, swelling, or pain. °? Fluid or blood. °? Warmth. °? Pus or a bad smell. °Activity °· Return to your normal activities as told by your health care provider. Ask your health care provider what activities are safe for you. °· Do not  lift anything that is heavier than 10 lb (4.5 kg), or the limit that you are told, until your health care provider says that it is safe. °General instructions °· Take over-the-counter and prescription medicines only as told by your health care provider. °· Do not take baths, swim, or use a hot tub until your health care provider approves. Ask your health care provider if you may take showers. You may only be allowed to take sponge baths. °· Do not drive for 24 hours if you were given a sedative during your procedure. °· Wear a medical alert bracelet in case of an emergency. This will tell any health care providers that you have a port. °· Keep all follow-up visits as told by your health care provider. This is important. °Contact a health care provider if: °· You cannot flush your port with saline as directed, or you cannot draw blood from the port. °· You have a fever or chills. °· You have redness, swelling, or pain around your port insertion site. °· You have fluid or blood coming from your port insertion site. °· Your port insertion site feels warm to the touch. °· You have pus or a bad smell coming from the port insertion site. °Get help right away if: °· You have chest pain or shortness of breath. °· You have bleeding from your port that you cannot control. °Summary °· Take care of the port as told by your health   care provider. Keep the manufacturer's information card with you at all times. °· Change your dressing as told by your health care provider. °· Contact a health care provider if you have a fever or chills or if you have redness, swelling, or pain around your port insertion site. °· Keep all follow-up visits as told by your health care provider. °This information is not intended to replace advice given to you by your health care provider. Make sure you discuss any questions you have with your health care provider. °Document Revised: 08/31/2017 Document Reviewed: 08/31/2017 °Elsevier Patient Education ©  2020 Elsevier Inc. ° °

## 2020-01-31 NOTE — Progress Notes (Signed)
Ok to treat with heart rate 100 per Dr Marin Olp

## 2020-02-01 ENCOUNTER — Inpatient Hospital Stay: Payer: 59 | Admitting: Hematology & Oncology

## 2020-02-01 ENCOUNTER — Inpatient Hospital Stay: Payer: 59

## 2020-02-01 ENCOUNTER — Other Ambulatory Visit: Payer: 59

## 2020-02-01 ENCOUNTER — Ambulatory Visit: Payer: 59

## 2020-02-05 ENCOUNTER — Other Ambulatory Visit: Payer: Self-pay

## 2020-02-05 ENCOUNTER — Other Ambulatory Visit (HOSPITAL_BASED_OUTPATIENT_CLINIC_OR_DEPARTMENT_OTHER): Payer: Self-pay | Admitting: Hematology & Oncology

## 2020-02-05 ENCOUNTER — Telehealth: Payer: Self-pay | Admitting: *Deleted

## 2020-02-05 DIAGNOSIS — C73 Malignant neoplasm of thyroid gland: Secondary | ICD-10-CM

## 2020-02-05 MED ORDER — FLUCONAZOLE 100 MG PO TABS: 100.0000 mg | ORAL_TABLET | Freq: Every day | ORAL | 3 refills | Status: DC

## 2020-02-05 NOTE — Telephone Encounter (Signed)
Patient showed up in lobby c/o rash on his abdomen. Offered to have dr Marin Olp to see him. Patient states that he cannot  wait, d/t an appt with his dentist today. He will be here for regularly scheduled appt with sarah cincinatti .  on 02/07/20 and discuss with her then.

## 2020-02-08 ENCOUNTER — Emergency Department (HOSPITAL_BASED_OUTPATIENT_CLINIC_OR_DEPARTMENT_OTHER): Payer: 59

## 2020-02-08 ENCOUNTER — Inpatient Hospital Stay: Payer: 59

## 2020-02-08 ENCOUNTER — Inpatient Hospital Stay: Payer: 59 | Admitting: Family

## 2020-02-08 ENCOUNTER — Other Ambulatory Visit: Payer: 59

## 2020-02-08 ENCOUNTER — Other Ambulatory Visit: Payer: Self-pay

## 2020-02-08 ENCOUNTER — Ambulatory Visit: Payer: 59

## 2020-02-08 ENCOUNTER — Encounter (HOSPITAL_BASED_OUTPATIENT_CLINIC_OR_DEPARTMENT_OTHER): Payer: Self-pay | Admitting: Emergency Medicine

## 2020-02-08 ENCOUNTER — Telehealth: Payer: Self-pay | Admitting: Family

## 2020-02-08 ENCOUNTER — Inpatient Hospital Stay (HOSPITAL_BASED_OUTPATIENT_CLINIC_OR_DEPARTMENT_OTHER): Payer: 59 | Admitting: Family

## 2020-02-08 ENCOUNTER — Inpatient Hospital Stay (HOSPITAL_BASED_OUTPATIENT_CLINIC_OR_DEPARTMENT_OTHER)
Admission: EM | Admit: 2020-02-08 | Discharge: 2020-02-13 | DRG: 872 | Disposition: A | Discharge status: Home / Self Care | Payer: 59 | Attending: Internal Medicine | Admitting: Internal Medicine

## 2020-02-08 VITALS — BP 126/88 | HR 138 | Temp 100.8°F | Resp 20

## 2020-02-08 DIAGNOSIS — Z86718 Personal history of other venous thrombosis and embolism: Secondary | ICD-10-CM

## 2020-02-08 DIAGNOSIS — Z20822 Contact with and (suspected) exposure to covid-19: Secondary | ICD-10-CM | POA: Diagnosis not present

## 2020-02-08 DIAGNOSIS — I10 Essential (primary) hypertension: Secondary | ICD-10-CM | POA: Diagnosis not present

## 2020-02-08 DIAGNOSIS — D84821 Immunodeficiency due to drugs: Secondary | ICD-10-CM | POA: Diagnosis present

## 2020-02-08 DIAGNOSIS — A419 Sepsis, unspecified organism: Principal | ICD-10-CM | POA: Diagnosis present

## 2020-02-08 DIAGNOSIS — R0789 Other chest pain: Secondary | ICD-10-CM | POA: Diagnosis not present

## 2020-02-08 DIAGNOSIS — E039 Hypothyroidism, unspecified: Secondary | ICD-10-CM | POA: Diagnosis present

## 2020-02-08 DIAGNOSIS — E876 Hypokalemia: Secondary | ICD-10-CM | POA: Diagnosis present

## 2020-02-08 DIAGNOSIS — Z79890 Hormone replacement therapy (postmenopausal): Secondary | ICD-10-CM

## 2020-02-08 DIAGNOSIS — R5383 Other fatigue: Secondary | ICD-10-CM | POA: Diagnosis not present

## 2020-02-08 DIAGNOSIS — T451X5A Adverse effect of antineoplastic and immunosuppressive drugs, initial encounter: Secondary | ICD-10-CM | POA: Diagnosis present

## 2020-02-08 DIAGNOSIS — I5032 Chronic diastolic (congestive) heart failure: Secondary | ICD-10-CM | POA: Diagnosis present

## 2020-02-08 DIAGNOSIS — R6520 Severe sepsis without septic shock: Secondary | ICD-10-CM | POA: Diagnosis not present

## 2020-02-08 DIAGNOSIS — I110 Hypertensive heart disease with heart failure: Secondary | ICD-10-CM | POA: Diagnosis present

## 2020-02-08 DIAGNOSIS — Z888 Allergy status to other drugs, medicaments and biological substances status: Secondary | ICD-10-CM

## 2020-02-08 DIAGNOSIS — Z6841 Body Mass Index (BMI) 40.0 and over, adult: Secondary | ICD-10-CM

## 2020-02-08 DIAGNOSIS — Z83438 Family history of other disorder of lipoprotein metabolism and other lipidemia: Secondary | ICD-10-CM

## 2020-02-08 DIAGNOSIS — B370 Candidal stomatitis: Secondary | ICD-10-CM | POA: Diagnosis not present

## 2020-02-08 DIAGNOSIS — Z7901 Long term (current) use of anticoagulants: Secondary | ICD-10-CM

## 2020-02-08 DIAGNOSIS — Z79899 Other long term (current) drug therapy: Secondary | ICD-10-CM

## 2020-02-08 DIAGNOSIS — R509 Fever, unspecified: Secondary | ICD-10-CM | POA: Diagnosis not present

## 2020-02-08 DIAGNOSIS — I82421 Acute embolism and thrombosis of right iliac vein: Secondary | ICD-10-CM | POA: Diagnosis not present

## 2020-02-08 DIAGNOSIS — Z9049 Acquired absence of other specified parts of digestive tract: Secondary | ICD-10-CM

## 2020-02-08 DIAGNOSIS — R531 Weakness: Secondary | ICD-10-CM | POA: Diagnosis not present

## 2020-02-08 DIAGNOSIS — R109 Unspecified abdominal pain: Secondary | ICD-10-CM | POA: Diagnosis not present

## 2020-02-08 DIAGNOSIS — C73 Malignant neoplasm of thyroid gland: Secondary | ICD-10-CM | POA: Diagnosis not present

## 2020-02-08 DIAGNOSIS — Z85850 Personal history of malignant neoplasm of thyroid: Secondary | ICD-10-CM

## 2020-02-08 DIAGNOSIS — D701 Agranulocytosis secondary to cancer chemotherapy: Secondary | ICD-10-CM | POA: Diagnosis present

## 2020-02-08 DIAGNOSIS — R059 Cough, unspecified: Secondary | ICD-10-CM | POA: Diagnosis not present

## 2020-02-08 DIAGNOSIS — Z8249 Family history of ischemic heart disease and other diseases of the circulatory system: Secondary | ICD-10-CM

## 2020-02-08 DIAGNOSIS — M545 Low back pain, unspecified: Secondary | ICD-10-CM

## 2020-02-08 DIAGNOSIS — Z886 Allergy status to analgesic agent status: Secondary | ICD-10-CM

## 2020-02-08 DIAGNOSIS — C799 Secondary malignant neoplasm of unspecified site: Secondary | ICD-10-CM | POA: Diagnosis not present

## 2020-02-08 DIAGNOSIS — J90 Pleural effusion, not elsewhere classified: Secondary | ICD-10-CM | POA: Diagnosis not present

## 2020-02-08 DIAGNOSIS — R200 Anesthesia of skin: Secondary | ICD-10-CM | POA: Diagnosis not present

## 2020-02-08 DIAGNOSIS — E0590 Thyrotoxicosis, unspecified without thyrotoxic crisis or storm: Secondary | ICD-10-CM | POA: Diagnosis present

## 2020-02-08 DIAGNOSIS — R000 Tachycardia, unspecified: Secondary | ICD-10-CM | POA: Diagnosis not present

## 2020-02-08 DIAGNOSIS — Z87442 Personal history of urinary calculi: Secondary | ICD-10-CM

## 2020-02-08 DIAGNOSIS — C787 Secondary malignant neoplasm of liver and intrahepatic bile duct: Secondary | ICD-10-CM | POA: Diagnosis not present

## 2020-02-08 DIAGNOSIS — N179 Acute kidney failure, unspecified: Secondary | ICD-10-CM | POA: Diagnosis present

## 2020-02-08 DIAGNOSIS — N200 Calculus of kidney: Secondary | ICD-10-CM | POA: Diagnosis not present

## 2020-02-08 DIAGNOSIS — Z833 Family history of diabetes mellitus: Secondary | ICD-10-CM

## 2020-02-08 DIAGNOSIS — G893 Neoplasm related pain (acute) (chronic): Secondary | ICD-10-CM | POA: Diagnosis present

## 2020-02-08 DIAGNOSIS — D61818 Other pancytopenia: Secondary | ICD-10-CM | POA: Diagnosis present

## 2020-02-08 DIAGNOSIS — R0602 Shortness of breath: Secondary | ICD-10-CM | POA: Diagnosis not present

## 2020-02-08 DIAGNOSIS — D6481 Anemia due to antineoplastic chemotherapy: Secondary | ICD-10-CM | POA: Diagnosis not present

## 2020-02-08 DIAGNOSIS — G8929 Other chronic pain: Secondary | ICD-10-CM | POA: Diagnosis not present

## 2020-02-08 DIAGNOSIS — Z86711 Personal history of pulmonary embolism: Secondary | ICD-10-CM

## 2020-02-08 DIAGNOSIS — Z881 Allergy status to other antibiotic agents status: Secondary | ICD-10-CM

## 2020-02-08 DIAGNOSIS — R5081 Fever presenting with conditions classified elsewhere: Secondary | ICD-10-CM | POA: Diagnosis present

## 2020-02-08 DIAGNOSIS — J189 Pneumonia, unspecified organism: Secondary | ICD-10-CM | POA: Diagnosis not present

## 2020-02-08 DIAGNOSIS — I2782 Chronic pulmonary embolism: Secondary | ICD-10-CM

## 2020-02-08 DIAGNOSIS — F419 Anxiety disorder, unspecified: Secondary | ICD-10-CM | POA: Diagnosis present

## 2020-02-08 LAB — CBC WITH DIFFERENTIAL/PLATELET
Abs Immature Granulocytes: 0.02 10*3/uL (ref 0.00–0.07)
Abs Immature Granulocytes: 0.01 10*3/uL (ref 0.00–0.07)
Basophils Absolute: 0 10*3/uL (ref 0.0–0.1)
Basophils Absolute: 0 10*3/uL (ref 0.0–0.1)
Basophils Relative: 1 %
Basophils Relative: 1 %
Eosinophils Absolute: 0 10*3/uL (ref 0.0–0.5)
Eosinophils Absolute: 0 10*3/uL (ref 0.0–0.5)
Eosinophils Relative: 1 %
Eosinophils Relative: 1 %
HCT: 31.3 % — ABNORMAL LOW (ref 39.0–52.0)
HCT: 32.6 % — ABNORMAL LOW (ref 39.0–52.0)
Hemoglobin: 10.1 g/dL — ABNORMAL LOW (ref 13.0–17.0)
Hemoglobin: 10.5 g/dL — ABNORMAL LOW (ref 13.0–17.0)
Immature Granulocytes: 0 %
Immature Granulocytes: 0 %
Lymphocytes Relative: 19 %
Lymphocytes Relative: 17 %
Lymphs Abs: 0.9 10*3/uL (ref 0.7–4.0)
Lymphs Abs: 0.7 10*3/uL (ref 0.7–4.0)
MCH: 32.9 pg (ref 26.0–34.0)
MCH: 32.7 pg (ref 26.0–34.0)
MCHC: 32.3 g/dL (ref 30.0–36.0)
MCHC: 32.2 g/dL (ref 30.0–36.0)
MCV: 102 fL — ABNORMAL HIGH (ref 80.0–100.0)
MCV: 101.6 fL — ABNORMAL HIGH (ref 80.0–100.0)
Monocytes Absolute: 0.7 10*3/uL (ref 0.1–1.0)
Monocytes Absolute: 0.6 10*3/uL (ref 0.1–1.0)
Monocytes Relative: 14 %
Monocytes Relative: 13 %
Neutro Abs: 3.2 10*3/uL (ref 1.7–7.7)
Neutro Abs: 3.1 10*3/uL (ref 1.7–7.7)
Neutrophils Relative %: 65 %
Neutrophils Relative %: 68 %
Platelets: 202 10*3/uL (ref 150–400)
Platelets: 212 10*3/uL (ref 150–400)
RBC: 3.07 MIL/uL — ABNORMAL LOW (ref 4.22–5.81)
RBC: 3.21 MIL/uL — ABNORMAL LOW (ref 4.22–5.81)
RDW: 17.8 % — ABNORMAL HIGH (ref 11.5–15.5)
RDW: 17.9 % — ABNORMAL HIGH (ref 11.5–15.5)
WBC: 4.9 10*3/uL (ref 4.0–10.5)
WBC: 4.4 10*3/uL (ref 4.0–10.5)
nRBC: 0 % (ref 0.0–0.2)
nRBC: 0 % (ref 0.0–0.2)

## 2020-02-08 LAB — COMPREHENSIVE METABOLIC PANEL
ALT: 28 U/L (ref 0–44)
ALT: 32 U/L (ref 0–44)
AST: 27 U/L (ref 15–41)
AST: 33 U/L (ref 15–41)
Albumin: 3.9 g/dL (ref 3.5–5.0)
Albumin: 3.8 g/dL (ref 3.5–5.0)
Alkaline Phosphatase: 68 U/L (ref 38–126)
Alkaline Phosphatase: 76 U/L (ref 38–126)
Anion gap: 11 (ref 5–15)
Anion gap: 12 (ref 5–15)
BUN: 35 mg/dL — ABNORMAL HIGH (ref 6–20)
BUN: 35 mg/dL — ABNORMAL HIGH (ref 6–20)
CO2: 30 mmol/L (ref 22–32)
CO2: 28 mmol/L (ref 22–32)
Calcium: 9.6 mg/dL (ref 8.9–10.3)
Calcium: 9.2 mg/dL (ref 8.9–10.3)
Chloride: 96 mmol/L — ABNORMAL LOW (ref 98–111)
Chloride: 95 mmol/L — ABNORMAL LOW (ref 98–111)
Creatinine, Ser: 2.3 mg/dL — ABNORMAL HIGH (ref 0.61–1.24)
Creatinine, Ser: 2.36 mg/dL — ABNORMAL HIGH (ref 0.61–1.24)
GFR, Estimated: 35 mL/min — ABNORMAL LOW (ref >60)
GFR, Estimated: 34 mL/min — ABNORMAL LOW (ref >60)
Glucose, Bld: 136 mg/dL — ABNORMAL HIGH (ref 70–99)
Glucose, Bld: 166 mg/dL — ABNORMAL HIGH (ref 70–99)
Potassium: 3.4 mmol/L — ABNORMAL LOW (ref 3.5–5.1)
Potassium: 3.3 mmol/L — ABNORMAL LOW (ref 3.5–5.1)
Sodium: 137 mmol/L (ref 135–145)
Sodium: 135 mmol/L (ref 135–145)
Total Bilirubin: 0.9 mg/dL (ref 0.3–1.2)
Total Bilirubin: 1 mg/dL (ref 0.3–1.2)
Total Protein: 6.1 g/dL — ABNORMAL LOW (ref 6.5–8.1)
Total Protein: 6.3 g/dL — ABNORMAL LOW (ref 6.5–8.1)

## 2020-02-08 LAB — URINALYSIS, ROUTINE W REFLEX MICROSCOPIC
Bilirubin Urine: NEGATIVE
Glucose, UA: NEGATIVE mg/dL
Hgb urine dipstick: NEGATIVE
Ketones, ur: NEGATIVE mg/dL
Leukocytes,Ua: NEGATIVE
Nitrite: NEGATIVE
Protein, ur: NEGATIVE mg/dL
Specific Gravity, Urine: 1.025 (ref 1.005–1.030)
pH: 5 (ref 5.0–8.0)

## 2020-02-08 LAB — LACTIC ACID, PLASMA
Lactic Acid, Venous: 1.6 mmol/L (ref 0.5–1.9)
Lactic Acid, Venous: 1.1 mmol/L (ref 0.5–1.9)

## 2020-02-08 LAB — PROTIME-INR
INR: 1.5 — ABNORMAL HIGH (ref 0.8–1.2)
Prothrombin Time: 17.4 seconds — ABNORMAL HIGH (ref 11.4–15.2)

## 2020-02-08 LAB — RESP PANEL BY RT-PCR (FLU A&B, COVID) ARPGX2
Influenza A by PCR: NEGATIVE
Influenza B by PCR: NEGATIVE
SARS Coronavirus 2 by RT PCR: NEGATIVE

## 2020-02-08 LAB — APTT: aPTT: 39 seconds — ABNORMAL HIGH (ref 24–36)

## 2020-02-08 MED ORDER — ONDANSETRON HCL 4 MG/2ML IJ SOLN
4.0000 mg | Freq: Once | INTRAMUSCULAR | Status: AC
  Administered 2020-02-08: 4 mg via INTRAVENOUS
  Filled 2020-02-08: qty 2

## 2020-02-08 MED ORDER — ACETAMINOPHEN 500 MG PO TABS
1000.0000 mg | ORAL_TABLET | Freq: Once | ORAL | Status: AC
  Administered 2020-02-08: 1000 mg via ORAL
  Filled 2020-02-08: qty 2

## 2020-02-08 MED ORDER — VANCOMYCIN HCL IN DEXTROSE 1-5 GM/200ML-% IV SOLN
1000.0000 mg | INTRAVENOUS | Status: AC
  Administered 2020-02-08: 1000 mg via INTRAVENOUS
  Filled 2020-02-08: qty 200

## 2020-02-08 MED ORDER — KETOROLAC TROMETHAMINE 30 MG/ML IJ SOLN: 30.0000 mg | Freq: Three times a day (TID) | INTRAMUSCULAR | Status: DC | PRN

## 2020-02-08 MED ORDER — SODIUM CHLORIDE 0.9% FLUSH
10.0000 mL | Freq: Once | INTRAVENOUS | Status: AC
  Administered 2020-02-08: 10 mL via INTRAVENOUS
  Filled 2020-02-08: qty 10

## 2020-02-08 MED ORDER — FENTANYL CITRATE (PF) 100 MCG/2ML IJ SOLN
100.0000 ug | Freq: Once | INTRAMUSCULAR | Status: AC
  Administered 2020-02-08: 100 ug via INTRAVENOUS
  Filled 2020-02-08: qty 2

## 2020-02-08 MED ORDER — METRONIDAZOLE IN NACL 5-0.79 MG/ML-% IV SOLN
500.0000 mg | Freq: Once | INTRAVENOUS | Status: AC
  Administered 2020-02-08: 500 mg via INTRAVENOUS
  Filled 2020-02-08: qty 100

## 2020-02-08 MED ORDER — FENTANYL CITRATE (PF) 100 MCG/2ML IJ SOLN
100.0000 ug | Freq: Once | INTRAMUSCULAR | Status: AC
Start: 2020-02-08 — End: 2020-02-08
  Administered 2020-02-08: 100 ug via INTRAVENOUS
  Filled 2020-02-08: qty 2

## 2020-02-08 MED ORDER — SODIUM CHLORIDE 0.9 % IV SOLN
2.0000 g | Freq: Once | INTRAVENOUS | Status: AC
  Administered 2020-02-08: 2 g via INTRAVENOUS
  Filled 2020-02-08: qty 2

## 2020-02-08 MED ORDER — VANCOMYCIN HCL IN DEXTROSE 1-5 GM/200ML-% IV SOLN
1000.0000 mg | Freq: Once | INTRAVENOUS | Status: DC
Start: 2020-02-08 — End: 2020-02-08

## 2020-02-08 MED ORDER — SODIUM CHLORIDE 0.9 % IV SOLN
INTRAVENOUS | Status: DC | PRN
  Administered 2020-02-08: 500 mL via INTRAVENOUS
  Administered 2020-02-11 – 2020-02-13 (×2): 250 mL via INTRAVENOUS

## 2020-02-08 MED ORDER — LACTATED RINGERS IV BOLUS (SEPSIS)
1000.0000 mL | Freq: Once | INTRAVENOUS | Status: AC
  Administered 2020-02-08: 1000 mL via INTRAVENOUS

## 2020-02-08 MED ORDER — ACETAMINOPHEN 500 MG PO TABS
1000.0000 mg | ORAL_TABLET | Freq: Four times a day (QID) | ORAL | Status: DC | PRN
  Administered 2020-02-08: 1000 mg via ORAL
  Filled 2020-02-08: qty 2

## 2020-02-08 MED ORDER — VANCOMYCIN HCL 1500 MG/300ML IV SOLN
1500.0000 mg | INTRAVENOUS | Status: DC
  Administered 2020-02-09 – 2020-02-10 (×2): 1500 mg via INTRAVENOUS
  Filled 2020-02-08 (×4): qty 300

## 2020-02-08 MED ORDER — IOHEXOL 300 MG/ML  SOLN
100.0000 mL | Freq: Once | INTRAMUSCULAR | Status: AC | PRN
  Administered 2020-02-08: 80 mL via INTRAVENOUS

## 2020-02-08 MED ORDER — SODIUM CHLORIDE 0.9 % IV SOLN
2.0000 g | Freq: Two times a day (BID) | INTRAVENOUS | Status: DC
  Administered 2020-02-09: 2 g via INTRAVENOUS
  Filled 2020-02-08 (×3): qty 2

## 2020-02-08 MED ORDER — LACTATED RINGERS IV SOLN: INTRAVENOUS | Status: DC

## 2020-02-08 MED ORDER — HEPARIN SOD (PORK) LOCK FLUSH 100 UNIT/ML IV SOLN
500.0000 [IU] | Freq: Once | INTRAVENOUS | Status: DC
  Filled 2020-02-08: qty 5

## 2020-02-08 MED ORDER — VANCOMYCIN HCL IN DEXTROSE 1-5 GM/200ML-% IV SOLN
1000.0000 mg | Freq: Once | INTRAVENOUS | Status: AC
  Administered 2020-02-08: 1000 mg via INTRAVENOUS
  Filled 2020-02-08: qty 200

## 2020-02-08 NOTE — Progress Notes (Signed)
Patient came in for infusion today. Complaining of being off balance, falling asleep easily, sob, and  pain in left side of abdomen, O2 94%, HR 138, and temp 100.8. MD/NP informed. Patient was sent to ER via wheelchair. Port left accessed.

## 2020-02-08 NOTE — Telephone Encounter (Signed)
Follow-up after discharged. Per 12/23 los

## 2020-02-08 NOTE — ED Provider Notes (Addendum)
[facility] EMERGENCY DEPARTMENT Provider Note   CSN: [id number] Arrival date & time: 02/08/20  1404     History Chief Complaint  Patient presents with  . Shortness of Breath  . Fever    Brian [name] is a 44 y.o. male past medical history of primary thyroid cancer with liver metastasis, nephrolithiasis, recent pulmonary embolism on Eliquis, presenting to the emergency department from visit with Dr. Marin Olp, oncologist, upstairs patient states over the last few days he has been feeling very fatigued and more sleepy than usual.  He also reports a mild cough.  He states his Apple Watch has shown he has had fast heart rate over the last few days.  Per chart review, he was diagnosed with multifocal pulmonary emboli without right heart strain on 01/13/2020, which showed resolution on 01/23/2020 on repeat CTA of the chest.  He has been compliant with his Eliquis since that time.  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  No documented fevers at home.  He endorses pain to his right mid abdomen with some faint bruising noted.  The pain radiates towards his right flank and back and then radiates downwards.  He has not had any trauma to his abdomen.  He endorses some mild intermittent shortness of breath though attributes this to the fatigue.  He is currently undergoing active chemotherapy treatment, and presented to the clinic today for an infusion, however was sent down for to the ED for evaluation.  The history is provided by the patient and medical records.       Past Medical History:  Diagnosis Date  . DVT, lower extremity, distal, acute, left ([place]) 01/14/2020  . Essential hypertension 09/09/2018  . Goals of care, counseling/discussion 01/14/2020  . Kidney stone   . Primary cancer of thyroid with metastasis to other site ([place]) 05/04/2018  . Rickettsia infection     Patient Active Problem List   Diagnosis Date Noted  . Sepsis ([place])  02/08/2020  . Goals of care, counseling/discussion 01/14/2020  . DVT, lower extremity, distal, acute, left ([place]) 01/14/2020  . Pulmonary embolism without acute cor pulmonale ([place]) 01/13/2020  . Essential hypertension 09/09/2018  . Primary cancer of thyroid with metastasis to other site ([place]) 05/04/2018  . Extrasystole 04/26/2018  . Dyspnea on exertion 04/26/2018  . Atypical chest pain 04/26/2018  . Ureteral stone with hydronephrosis 02/15/2018  . Right thyroid nodule 04/09/2017  . Bilateral knee pain 07/01/2016  . Left foot pain 02/06/2016  . Gastroesophageal reflux disease without esophagitis 05/08/2014  . Thrombocytopenia, unspecified ([place]) 06/13/2013  . 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 Low back pain 05/12/2012  . Allergic rhinitis 06/03/2009    Past Surgical History:  Procedure Laterality Date  . BIOPSY  04/05/2018   Procedure: BIOPSY;  Surgeon: Ronald Lobo, MD;  Location: [facility] END[facility]COPY;  Service: Endoscopy;;  . CHOLECYSTECTOMY    . ESOPHAGOGASTRODUODENOSCOPY (EGD) WITH PROPOFOL N/A 04/05/2018   Procedure: ESOPHAGOGASTRODUODENOSCOPY (EGD) WITH PROPOFOL;  Surgeon: Ronald Lobo, MD;  Location: [facility] ENDOSCOPY;  Service: Endoscopy;  Laterality: N/A;  . IR IMAGING GUIDED PORT INSERTION  09/13/2019  . IR RADIOLOGIST EVAL & MGMT  10/11/2018       Family History  Problem Relation Age of Onset  . Diabetes Mother   . Heart disease Mother   . Heart failure Mother   . Heart attack Mother   . Hyperlipidemia Mother   . Hypertension Mother   . Hyperlipidemia Father   .  Allergic rhinitis Father   . Rashes / Skin problems Father   . Sudden death Neg Hx   . Thyroid disease Neg Hx     Social History   Tobacco Use  . Smoking status: Never Smoker  . Smokeless tobacco: Never Used  Vaping Use  . Vaping Use: Never used  Substance Use Topics  . Alcohol use: No  . Drug use: No    Home Medications Prior to Admission medications   Medication Sig Start Date End Date Taking?  Authorizing Provider  apixaban (ELIQUIS) 5 MG TABS tablet Take 1 tablet (5 mg total) by mouth 2 (two) times daily. 01/25/20   [name], MD  azithromycin (ZITHROMAX Z-PAK) 250 MG tablet Take as directed 01/31/20   [name], MD  baclofen (LIORESAL) 10 MG tablet TAKE 1 TABLET BY MOUTH THREE TIMES DAILY Patient taking differently: Take 10 mg by mouth 3 (three) times daily as needed for muscle spasms. 01/13/20   [name], MD  cloNIDine (CATAPRES) 0.1 MG tablet Take 0.1 mg by mouth 2 (two) times daily as needed (high blood pressure).  09/06/19   [provider]  cyclobenzaprine (FLEXERIL) 10 MG tablet Take 1 tablet (10 mg total) by mouth 3 (three) times daily as needed for muscle spasms. 01/31/20   [name], MD  EPINEPHrine (EPIPEN 2-PAK) 0.3 mg/0.3 mL IJ SOAJ injection USE AS DIRECTED FOR LIFE THREATENING ALLERGIC REACTIONS 04/30/15   Kozlow, Donnamarie Poag, MD  fluconazole (DIFLUCAN) 100 MG tablet Take 1 tablet (100 mg total) by mouth daily. 02/05/20   [name], MD  furosemide (LASIX) 80 MG tablet TAKE 1 TABLET (80 MG TOTAL) BY MOUTH DAILY. Patient taking differently: Take 80 mg by mouth daily as needed for fluid. 11/24/19   [name], MD  gabapentin (NEURONTIN) 300 MG capsule TAKE 1 CAPSULE (300 MG TOTAL) BY MOUTH 3 (THREE) TIMES DAILY. 11/13/19   [name], MD  JANSSEN COVID-19 VACCINE 0.5 ML injection  11/14/19   [provider]  levothyroxine (SYNTHROID) 200 MCG tablet Take 200 mcg by mouth daily. Take along with 88 mcg=288 mcg 10/16/19   [provider]  levothyroxine (SYNTHROID) 88 MCG tablet Take 88 mcg by mouth daily before breakfast. Take along with 200 mcg=288 mcg 10/25/19   [provider]  lidocaine-prilocaine (EMLA) cream Apply to affected area once 12/05/19   Ennever, Rudell Cobb, MD  LORazepam (ATIVAN) 0.5 MG tablet Take 1 tablet (0.5 mg total) by mouth every 6 (six) hours as needed (Nausea or vomiting). 01/25/20 04/24/20   [name], MD  losartan (COZAAR) 25 MG tablet Take 1 tablet (25 mg total) by mouth 2 (two) times daily. Patient taking differently: Take 25 mg by mouth 2 (two) times daily as needed (high blood pressure). 03/02/19   Park Liter, MD  magic mouthwash SOLN Take 10 mLs by mouth 4 (four) times daily as needed for mouth pain. 10/06/19   [name], MD  methylphenidate (RITALIN) 10 MG tablet Take 1 tablet (10 mg total) by mouth 2 (two) times daily. Patient taking differently: Take 10 mg by mouth daily. 11/27/19   [name], MD  metoCLOPramide (REGLAN) 10 MG tablet TAKE 1 TABLET BY MOUTH FOUR TIMES DAILY BEFORE MEALS AND AT BEDTIME Patient taking differently: Take 10 mg by mouth every 6 (six) hours as needed for nausea or vomiting. 06/01/19   [name], MD  metolazone (ZAROXOLYN) 5 MG tablet TAKE 1  TABLET (5 MG TOTAL) BY MOUTH DAILY. TAKE 1 HOUR BEFORE LASIX. 11/24/19   [name], MD  Multiple Vitamin (MULTIVITAMIN) capsule Take 1 capsule by mouth daily.    [provider]  OLANZapine (ZYPREXA) 10 MG tablet Take 10 mg by mouth at bedtime. 01/18/20   [provider]  oxyCODONE (OXYCONTIN) 20 mg 12 hr tablet Take 1 tablet (20 mg total) by mouth every 12 (twelve) hours. 01/25/20   [name], MD  Oxycodone HCl 10 MG TABS Take 1 tablet (10 mg total) by mouth every 6 (six) hours as needed. 01/25/20   [name], MD  potassium chloride (KLOR-CON) 10 MEQ tablet TAKE 1 TABLET (10 MEQ TOTAL) BY MOUTH DAILY. 11/13/19   [place], Holli Humbles, NP  prochlorperazine (COMPAZINE) 10 MG tablet Take 1 tablet (10 mg total) by mouth every 6 (six) hours as needed (Nausea or vomiting). 12/05/19   [name], MD  sucralfate (CARAFATE) 1 GM/10ML suspension Take 10 mLs (1 g total) by mouth 4 (four) times daily -  with meals and at bedtime. Patient taking differently: Take 1 g by mouth 4 (four) times daily as needed (prevent ulcers). 10/05/19   [name], MD     Allergies    Dextromethorphan-guaifenesin, Doxycycline, Gadobutrol, Gadolinium derivatives, Guaifenesin, Ibuprofen, Lisinopril, Pantoprazole, Tramadol, and Barium  Review of Systems   Review of Systems  Constitutional: Positive for appetite change and fatigue.  Gastrointestinal: Positive for abdominal pain.  Skin: Positive for color change.  Allergic/Immunologic: Positive for immunocompromised state.  All other systems reviewed and are negative.   Physical Exam Updated Vital Signs BP 136/89   Pulse (!) 119   Temp (!) 101.3 F (38.5 C) (Oral)   Resp (!) 24   Ht 5\' 10"  (1.778 m)   Wt 129 kg   SpO2 94%   BMI 40.81 kg/m   Physical Exam Vitals and nursing note reviewed.  Constitutional:      Appearance: He is well-developed and well-nourished. He is ill-appearing.  HENT:     Head: Normocephalic and atraumatic.  Eyes:     Conjunctiva/sclera: Conjunctivae normal.  Cardiovascular:     Rate and Rhythm: Regular rhythm. Tachycardia present.  Pulmonary:     Effort: Pulmonary effort is normal.     Breath sounds: Examination of the right-upper field reveals rhonchi. Examination of the right-middle field reveals rhonchi. Examination of the right-lower field reveals wheezing and rhonchi. Examination of the left-lower field reveals wheezing and rhonchi. Wheezing and rhonchi present.  Abdominal:     General: Bowel sounds are normal.     Palpations: Abdomen is soft.     Tenderness: There is abdominal tenderness.     Comments: Right mid abdomen with faint bruise noted with overlying TTP. TTP extends towards right flank, no guarding or rebound. No bruising to the flank  Musculoskeletal:     Right lower leg: Edema present.     Left lower leg: Edema present.  Skin:    General: Skin is warm.  Neurological:     Mental Status: He is alert.  Psychiatric:        Mood and Affect: Mood and affect normal.        Behavior: Behavior normal.     ED Results / Procedures / Treatments    Labs (all labs ordered are listed, but only abnormal results are displayed) Labs Reviewed  COMPREHENSIVE METABOLIC PANEL - Abnormal; Notable for the following components:      Result Value  Potassium 3.3 (*)    Chloride 95 (*)    Glucose, Bld 166 (*)    BUN 35 (*)    Creatinine, Ser 2.36 (*)    Total Protein 6.3 (*)    GFR, Estimated 34 (*)    All other components within normal limits  CBC WITH DIFFERENTIAL/PLATELET - Abnormal; Notable for the following components:   RBC 3.21 (*)    Hemoglobin 10.5 (*)    HCT 32.6 (*)    MCV 101.6 (*)    RDW 17.9 (*)    All other components within normal limits  PROTIME-INR - Abnormal; Notable for the following components:   Prothrombin Time 17.4 (*)    INR 1.5 (*)    All other components within normal limits  APTT - Abnormal; Notable for the following components:   aPTT 39 (*)    All other components within normal limits  RESP PANEL BY RT-PCR (FLU A&B, COVID) ARPGX2  CULTURE, BLOOD (ROUTINE X 2)  CULTURE, BLOOD (ROUTINE X 2)  URINE CULTURE  LACTIC ACID, PLASMA  LACTIC ACID, PLASMA  URINALYSIS, ROUTINE W REFLEX MICROSCOPIC    EKG None  Radiology CT Head Wo Contrast  Result Date: 02/08/2020 CLINICAL DATA:  Right-sided back and abdominal pain, fever, cough, left facial numbness, history of metastatic thyroid cancer EXAM: CT HEAD WITHOUT CONTRAST TECHNIQUE: Contiguous axial images were obtained from the base of the skull through the vertex without intravenous contrast. COMPARISON:  01/23/2020 FINDINGS: Brain: No acute infarct or hemorrhage. Lateral ventricles and midline structures are unremarkable. No acute extra-axial fluid collections. No mass effect. Vascular: No hyperdense vessel or unexpected calcification. Skull: Normal. Negative for fracture or focal lesion. Sinuses/Orbits: Mucosal thickening ethmoid air cells. Other: None. IMPRESSION: 1. Stable head CT, no acute process. Electronically Signed   By: Randa Ngo M.D.   On:  02/08/2020 15:54   CT Abdomen Pelvis W Contrast  Result Date: 02/08/2020 CLINICAL DATA:  RIGHT-side abdominal pain radiating to lower abdomen in mid back, has redness at flanks, fever, cough, shortness of breath, episodes of falling asleep; past history of metastatic thyroid cancer, hypertension, kidney stones EXAM: CT ABDOMEN AND PELVIS WITH CONTRAST TECHNIQUE: Multidetector CT imaging of the abdomen and pelvis was performed using the standard protocol following bolus administration of intravenous contrast. Sagittal and coronal MPR images reconstructed from axial data set. CONTRAST:  75mL OMNIPAQUE IOHEXOL 300 MG/ML SOLN IV. No oral contrast. COMPARISON:  07/27/2019 FINDINGS: Lower chest: Minimal basilar atelectasis Hepatobiliary: Ill-defined masses in RIGHT lobe of liver consistent with metastatic disease. These are less well-defined than on the previous exam due to suboptimal contrast. Gallbladder surgically absent. Pancreas: Atrophic pancreas without mass Spleen: Normal appearance Adrenals/Urinary Tract: Adrenal glands, kidneys, ureters, bladder normal appearance Stomach/Bowel: Normal appendix. Stool throughout colon. Stomach and bowel loops otherwise normal appearance Vascular/Lymphatic: Enlarged portal caval lymph node 4.3 cm short axis image 32, new. No additional adenopathy. Vascular structures grossly patent Reproductive: Unremarkable prostate gland and seminal vesicles Other: No free air or free fluid. Tiny umbilical hernia containing fat. Nonspecific subcutaneous infiltrative changes in the RIGHT mid abdominal wall, may represent medication injection sites. Musculoskeletal: No acute osseous findings. IMPRESSION: Ill-defined masses in RIGHT lobe of liver consistent with metastatic disease, difficult to compared to a previous exam due to suboptimal liver enhancement on current study, suspect stable to minimally improved. Enlarged portal caval lymph node 4.3 cm short axis, new concerning for metastatic  disease. Tiny umbilical hernia containing fat. Increased stool throughout colon. Electronically Signed  By: Lavonia Dana M.D.   On: 02/08/2020 16:03   DG Chest Portable 1 View  Result Date: 02/08/2020 CLINICAL DATA:  Shortness of breath with fever and tachycardia. EXAM: PORTABLE CHEST 1 VIEW COMPARISON:  01/13/2020 FINDINGS: 1434 hours. Low volume lordotic film. Cardiopericardial silhouette is at upper limits of normal for size. The lungs are clear without focal pneumonia, edema, pneumothorax or pleural effusion. Left Port-A-Cath tip overlies the SVC/RA junction. Telemetry leads overlie the chest. IMPRESSION: Low volume film without acute cardiopulmonary findings. Electronically Signed   By: Misty Stanley M.D.   On: 02/08/2020 14:55    Procedures .Critical Care Performed by: Okechukwu Regnier, [place] N, PA-C Authorized by: Sincerity Cedar, [place] N, PA-C   Critical care provider statement:    Critical care time (minutes):  45   Critical care time was exclusive of:  Separately billable procedures and treating other patients and teaching time   Critical care was necessary to treat or prevent imminent or life-threatening deterioration of the following conditions:  Sepsis   Critical care was time spent personally by me on the following activities:  Discussions with consultants, evaluation of patient's response to treatment, examination of patient, ordering and performing treatments and interventions, ordering and review of laboratory studies, ordering and review of radiographic studies, pulse oximetry, re-evaluation of patient's condition, obtaining history from patient or surrogate and review of old charts   I assumed direction of critical care for this patient from another provider in my specialty: no     (including critical care time)  Medications Ordered in ED Medications  lactated ringers infusion ( Intravenous New Bag/Given 02/08/20 1708)  vancomycin (VANCOCIN) IVPB 1000 mg/200 mL premix (0 mg Intravenous  Stopped 02/08/20 1917)  ceFEPIme (MAXIPIME) 2 g in sodium chloride 0.9 % 100 mL IVPB (has no administration in time range)  vancomycin (VANCOREADY) IVPB 1500 mg/300 mL (has no administration in time range)  0.9 %  sodium chloride infusion ( Intravenous Stopped 02/08/20 1941)  lactated ringers bolus 1,000 mL (0 mLs Intravenous Stopped 02/08/20 1701)  ceFEPIme (MAXIPIME) 2 g in sodium chloride 0.9 % 100 mL IVPB (0 g Intravenous Stopped 02/08/20 1814)  metroNIDAZOLE (FLAGYL) IVPB 500 mg (0 mg Intravenous Stopped 02/08/20 1701)  fentaNYL (SUBLIMAZE) injection 100 mcg (100 mcg Intravenous Given 02/08/20 1628)  iohexol (OMNIPAQUE) 300 MG/ML solution 100 mL (80 mLs Intravenous Contrast Given 02/08/20 1524)  acetaminophen (TYLENOL) tablet 1,000 mg (1,000 mg Oral Given 02/08/20 1619)    ED Course  I have reviewed the triage vital signs and the nursing notes.  Pertinent labs & imaging results that were available during my care of the patient were reviewed by me and considered in my medical decision making (see chart for details).  Clinical Course as of 02/08/20 1942  Thu Feb 08, 2020  1733 Patient's wife is now at bedside, reports he just finished a zpack 2 days ago. Family all had sinus infections. She reports he continues to cough and wheeze at night. Suspect respiratory source. U/A is negative. Will admit for further management of sepsis [JR]    Clinical Course User Index [JR] Vlada Uriostegui, [place] N, PA-C   MDM Rules/Calculators/A&P                          Patient presenting for fatigue, fever, cough over the last few days.  Was seen today at outpatient oncology for chemotherapy infusion for treatment of his primary thyroid cancer with metastasis to the liver.  He  was found to be febrile and tachycardic therefore sent to the ED for further evaluation.  On arrival he is septic presentation with fever of 102.1 F, tachycardia into the 130s, tachypnea.  Code sepsis is initiated.  On initial discussion,  no clear source of infection.  He does report some mild cough, though is mostly complaining of right mid abdominal pain and bruising.  He is reporting quite a bit of fatigue and increased drowsiness.  He has been having some instability on his feet, states he is favoring the left and feels as though he wants to lean towards the left.  Reports decrease sensation to his neck and lower face which is been occurring since he had thyroidectomy and lymph node removal, however feels as though maybe his left face has a little bit less sensation than usual.  He has normal motor function of the face.  He has no other focal neuro deficits noted though he is not ambulated at the time.  He is compliant on his Eliquis, after recent PE which showed resolution on follow-up CT imaging.  He has some irritation under his bilateral axilla from new deodorant.  He has equal lower extremity edema noted.  He has rhonchi and wheezing bilaterally.  Broad-spectrum antibiotics ordered.  IV fluids.  Labs with white count of 4.4 which appears stable from previous, absolute neutrophils of 3.1.  No evidence of neutropenic fever on CBC.  Metabolic panel however shows acute renal injury with creatinine of 2.36 and GFR 34, BUN 35, this is all new since 8 days ago with creatinine of 1.3 and BUN of 38.  He is getting dose adjusted CT scan the abdomen, plain film of the chest, as well as CT imaging of his head.  CT imaging of the abdomen is negative for acute intra-abdominal pathology as source of infection.  CT head is also negative, presentation seems less likely stroke.  Chest x-ray shows no evidence of a pneumonia.  UA is negative for infection.  Respiratory panel for Covid and influenza is negative  Patient's wife arrives and is at bedside.  She reports the family has gotten over a sinus infection recently, patient was recently treated for sinus infection and finished a Z-Pak about 2 days ago.  She states he has been wheezing more in the  evening and does have quite a bit of a cough.  Suspect this may be the source of infection.  Patient is admitted to [facility] long for sepsis with acute kidney injury.  Patient is developing some redness to the left lower leg that was not observed on initial evaluation of lower extremities.  He recently had DVT of the left popliteal pain a couple of weeks ago, this may be related though also consider cellulitis.  Patient may benefit from repeat vascular study the morning after transfer, which is not currently available in this ED.  Patient's blood pressures remained stable.  Heart rate is improving.  He is stable for transfer.  Final Clinical Impression(s) / ED Diagnoses Final diagnoses:  Sepsis, due to unspecified organism, unspecified whether acute organ dysfunction present [facility])  AKI (acute kidney injury) [facility])    Rx / [place] Orders ED Discharge Orders    None       Danyla Wattley, [place] N, PA-C 02/08/20 1942    Shepherd Finnan, [place] N, PA-C 02/08/20 2027    Lucrezia Starch, MD 02/12/20 0730

## 2020-02-08 NOTE — Progress Notes (Signed)
Hematology and Oncology Follow Up Visit  Brian [name] [id number] [birth year] 44 y.o. 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 He was lightheaded, off balance, lethargic, tachycardic, SOB with any exertion and O2 sat was 94 on room air. Temp is 100.8 and BP 126/88.  He states that he has felt this way for several days. He was here earlier in the week but was unable to stay for assessment due to a dental appointment.  He is able to answer questions appropriately and does not appear to be in any immediate distress at this time.   ECOG Performance Status: 2 - Symptomatic, <50% confined to bed  Medications:  Allergies as of 02/08/2020      Reactions   Dextromethorphan-guaifenesin Other (See Comments)   Irregular heartbeat   Doxycycline Anaphylaxis, Nausea And Vomiting, Rash, Other (See Comments)   "heart arrythmia" and "dyspepsia" (only oral doxycycline causes reaction)   Gadobutrol Hives, Other (See Comments)   Patient had MRI scan at [facility]. Patient called one hour after he left imaging facility to report two "blisters" that came up on "back" of lip.    Gadolinium Derivatives Hives   Patient had MRI scan at [facility]. Patient called one hour after he left imaging facility to report two "blisters" that came up on "back" of lip.    Guaifenesin Palpitations      Ibuprofen Hives, Itching   Lisinopril Other (See Comments)   Angioedema   Pantoprazole Itching    Tramadol Hives, Itching   Barium Rash   Developed redness around neck after drinking 1st bottle of Barocat; pt was given Benedryl by ED Mds to be "on the safe side" before drinking 2nd bottle      Medication List       Accurate as of February 08, 2020  3:19 PM. If you have any questions, ask your nurse or doctor.        apixaban 5 MG Tabs tablet Commonly known as: ELIQUIS Take 1 tablet (5 mg total) by mouth 2 (two) times daily.   azithromycin 250 MG tablet Commonly known as: Zithromax Z-Pak Take as directed   baclofen 10 MG tablet Commonly known as: LIORESAL TAKE 1 TABLET BY MOUTH THREE TIMES DAILY What changed:   when to take this  reasons to take this   cloNIDine 0.1 MG tablet Commonly known as: CATAPRES Take 0.1 mg by mouth 2 (two) times daily as needed (high blood pressure).   cyclobenzaprine 10 MG tablet Commonly known as: FLEXERIL Take 1 tablet (10 mg total) by mouth 3 (three) times daily as needed for muscle spasms.   EPINEPHrine 0.3 mg/0.3 mL Soaj injection Commonly known as: EpiPen 2-Pak USE AS DIRECTED FOR LIFE THREATENING ALLERGIC REACTIONS   fluconazole 100 MG tablet Commonly known as: DIFLUCAN Take 1 tablet (100 mg total) by mouth daily.   furosemide 80 MG tablet Commonly known as: LASIX TAKE 1 TABLET (80 MG TOTAL) BY  MOUTH DAILY. What changed:   when to take this  reasons to take this   gabapentin 300 MG capsule Commonly known as: NEURONTIN TAKE 1 CAPSULE (300 MG TOTAL) BY MOUTH 3 (THREE) TIMES DAILY.   Janssen COVID-19 Vaccine 0.5 ML injection Generic drug: COVID-19 Ad26 vaccine (JANSSEN/J&J)   levothyroxine 200 MCG tablet Commonly known as: SYNTHROID Take 200 mcg by mouth daily. Take along with 88 mcg=288 mcg   levothyroxine 88 MCG tablet Commonly known as: SYNTHROID Take 88 mcg by mouth daily before breakfast. Take along with 200 mcg=288 mcg   lidocaine-prilocaine cream Commonly known as: EMLA Apply to affected area once    LORazepam 0.5 MG tablet Commonly known as: ATIVAN Take 1 tablet (0.5 mg total) by mouth every 6 (six) hours as needed (Nausea or vomiting).   losartan 25 MG tablet Commonly known as: COZAAR Take 1 tablet (25 mg total) by mouth 2 (two) times daily. What changed:   when to take this  reasons to take this   magic mouthwash Soln Take 10 mLs by mouth 4 (four) times daily as needed for mouth pain.   methylphenidate 10 MG tablet Commonly known as: RITALIN Take 1 tablet (10 mg total) by mouth 2 (two) times daily. What changed: when to take this   metoCLOPramide 10 MG tablet Commonly known as: REGLAN TAKE 1 TABLET BY MOUTH FOUR TIMES DAILY BEFORE MEALS AND AT BEDTIME What changed: See the new instructions.   metolazone 5 MG tablet Commonly known as: ZAROXOLYN TAKE 1 TABLET (5 MG TOTAL) BY MOUTH DAILY. TAKE 1 HOUR BEFORE LASIX.   multivitamin capsule Take 1 capsule by mouth daily.   OLANZapine 10 MG tablet Commonly known as: ZYPREXA Take 10 mg by mouth at bedtime.   oxyCODONE 20 mg 12 hr tablet Commonly known as: OXYCONTIN Take 1 tablet (20 mg total) by mouth every 12 (twelve) hours.   Oxycodone HCl 10 MG Tabs Take 1 tablet (10 mg total) by mouth every 6 (six) hours as needed.   potassium chloride 10 MEQ tablet Commonly known as: KLOR-CON TAKE 1 TABLET (10 MEQ TOTAL) BY MOUTH DAILY.   prochlorperazine 10 MG tablet Commonly known as: COMPAZINE Take 1 tablet (10 mg total) by mouth every 6 (six) hours as needed (Nausea or vomiting).   sucralfate 1 GM/10ML suspension Commonly known as: Carafate Take 10 mLs (1 g total) by mouth 4 (four) times daily -  with meals and at bedtime. What changed:   when to take this  reasons to take this       Allergies:  Allergies  Allergen Reactions  . Dextromethorphan-Guaifenesin Other (See Comments)    Irregular heartbeat   . Doxycycline Anaphylaxis, Nausea And Vomiting, Rash and Other (See Comments)    "heart arrythmia" and  "dyspepsia" (only oral doxycycline causes reaction)  . Gadobutrol Hives and Other (See Comments)    Patient had MRI scan at [place]. Patient called one hour after he left imaging facility to report two "blisters" that came up on "back" of lip.    . Gadolinium Derivatives Hives    Patient had MRI scan at [facility]. Patient called one hour after he left imaging facility to report two "blisters" that came up on "back" of lip.   . Guaifenesin Palpitations       . Ibuprofen Hives and Itching  . Lisinopril Other (See Comments)    Angioedema  . Pantoprazole Itching  . Tramadol Hives and Itching  . Barium Rash  Developed redness around neck after drinking 1st bottle of Barocat; pt was given Benedryl by ED Mds to be "on the safe side" before drinking 2nd bottle    Past Medical History, Surgical history, Social history, and Family History were reviewed and updated.  Review of Systems: All other 10 point review of systems is negative.   Physical Exam:  vitals were not taken for this visit.   Wt Readings from Last 3 Encounters:  02/08/20 284 lb 6.3 oz (129 kg)  01/25/20 286 lb (129.7 kg)  01/23/20 285 lb 3.2 oz (129.4 kg)    Lab Results  Component Value Date   WBC 4.4 02/08/2020   HGB 10.5 (L) 02/08/2020   HCT 32.6 (L) 02/08/2020   MCV 101.6 (H) 02/08/2020   PLT 212 02/08/2020   Lab Results  Component Value Date   FERRITIN 370 (H) 01/18/2020   IRON 64 01/18/2020   TIBC 303 01/18/2020   UIBC 239 01/18/2020   IRONPCTSAT 21 01/18/2020   Lab Results  Component Value Date   RETICCTPCT 5.3 (H) 01/18/2020   RBC 3.21 (L) 02/08/2020   No results found for: KPAFRELGTCHN, LAMBDASER, KAPLAMBRATIO No results found for: IGGSERUM, IGA, IGMSERUM No results found for: Odetta Pink, SPEI   Chemistry      Component Value Date/Time   NA 135 02/08/2020 1425   NA 141 02/06/2019 1210   K 3.3 (L) 02/08/2020 1425   CL  95 (L) 02/08/2020 1425   CO2 28 02/08/2020 1425   BUN 35 (H) 02/08/2020 1425   BUN 8 02/06/2019 1210   CREATININE 2.36 (H) 02/08/2020 1425   CREATININE 1.03 01/25/2020 1154      Component Value Date/Time   CALCIUM 9.2 02/08/2020 1425   ALKPHOS 76 02/08/2020 1425   AST 33 02/08/2020 1425   AST 26 01/25/2020 1154   ALT 32 02/08/2020 1425   ALT 41 01/25/2020 1154   BILITOT 1.0 02/08/2020 1425   BILITOT 0.7 01/25/2020 1154       Impression and Plan: Brian [name] is a very pleasant 44 yo caucasian gentleman with an 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  He is symptomatic as mentioned above and after updating Dr. Marin Olp and calling report to Dr. Ferdinand Lango we sent Brian [name] to the ED for further work up and likely hospital admission.  We will follow-up in clinic once he is discharged. Dr. Marin Olp will follow along with him in hospital if admitted  Laverna Peace, NP 12/23/20213:19 PM

## 2020-02-08 NOTE — Sepsis Progress Note (Signed)
Brian [name] is monitoring this sepsis.Thank you

## 2020-02-08 NOTE — H&P (Addendum)
History and Physical   Brian [name] HUD:[id number] DOB: 07/29/1975 DOA: 02/08/2020  PCP: Sharyne Richters, MD  Outpatient Specialists: [facility], Dr. Burney Gauze Patient coming from: [facility]  I have personally briefly reviewed patient's old medical records in [place].  Chief Concern: weakness   HPI: Brian [name] is a 44 y.o. 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   Brian [name], dull and burning [name]. He denies trauma and he has never had before. He reports the [name] is 8-9/10, lasts hours, minimally improves with [name] medications, oxycodone 10 mg and flexeril at home.   He was supposed to have another round of chemo on 02/08/20, however wasn't able to complete it due to elevated temperature and was sent to the ED for further evaluation.  His last chemo tx was 02/01/20.   Social history: he currently works as an Public relations account executive. He lives with spouse and kids (5 and 1). He denies etoh consumption, tobacco, recreational drug use.   Constitutional: No Weight Change, No Fever ENT/Mouth: No sore throat, No Rhinorrhea Eyes: No Eye [name], No Vision Changes Cardiovascular: No Chest [name], no SOB,  No Edema, No Palpitations Respiratory: No Cough, No Sputum Gastrointestinal: No Nausea, No Vomiting, No Diarrhea, Genitourinary: no Urinary Incontinence Musculoskeletal: No Arthralgias, No Myalgias Skin: No Skin Lesions, No Pruritus, Neuro: + Weakness, +  Numbness,  No Loss of Consciousness, No Syncope Psych: No Anxiety/Panic, No Depression, + decrease appetite Heme/Lymph: No Bruising, No Bleeding  ED Course: patient was given [name] control and sepsis protocol was initiated and he received broad abx with vanc, cefepime, and metronidazole.  Assessment/Plan  Active Problems:   Low back [name]   Primary cancer of thyroid with metastasis to other site [facility])   Essential hypertension   Sepsis ([place])   Obesity, Class III, BMI 40-49.9 (morbid obesity) (HCC)   Hypothyroidism   AKI (acute kidney injury) ([place])   Sepsis-and weakness, etiology unknown at this time (patient does have a port-a-cath in place) Severe sepsis - organ involvement is renal -Status post broad-spectrum antibiotics, vancomycin, metronidazole, cefepime, 1 L LR bolus, LR IVF at 150 cc/h per ED provider -LR IVF has been discontinued -Status post ondansetron 4 mg IV once, fentanyl 100 mcg x 2 IV at [facility] -Lactic acid negative x2 - leukocytosis -Respiratory panel was negative for Covid -UA was negative for leukocytes and nitrites -Blood cultures x2 active and in process and urine culture are pending -Checking pro-Cal  Abdominal [name]- -Patient states the fentanyl has not improved his [name] -Patient reports Dilaudid helps him with his [name] -Dilaudid 1 mg IV every 3 hours as needed for severe [name] for 1 day ordered -Oxycodone 20 mg p.o. every 12 hours ordered  AKI - BUN/Cr ratio is 15 and patient is making urine, doubt post renal and given bilateral pitting edmea, suspect cardio-renal, vs sepsis - [place] of renal ordered - Echo complete ordered - Urine Osm and na ordered - CBC in the  AM - Strict I and Os - Lasix 100 mg IV once ordered - holding home losartan due to aki  Hypertension - hydralazine 25 mg PO prn q6h for SBP > 180, clonidine (home med) 0.1 mg prn for hypertension  History of dvt and bilateral PE - patient endorses compliance with eliquis 5 mg BID  Hypothyroid -  resumed home levothyroxine 288 mcg daily  Anxiety - ativan 0.5 mg q6h prn for anxiety, nausea, vomiting  Insomnia - olanzapine 10 mg qhs  Chart reviewed.   DVT prophylaxis: on eliquis  Code Status: full code Diet: heart healthy, carb Family Communication: none at this time  Disposition Plan: pending clinical course Consults called: not at this time Admission status: obseveration  Past Medical History:  Diagnosis Date  . DVT, lower extremity, distal, acute, left ([place]) 01/14/2020  . Essential hypertension 09/09/2018  . Goals of care, counseling/discussion 01/14/2020  . Kidney stone   . Primary cancer of thyroid with metastasis to other site ([place]) 05/04/2018  . Rickettsia infection    Past Surgical History:  Procedure Laterality Date  . BIOPSY  04/05/2018   Procedure: BIOPSY;  Surgeon: Ronald Lobo, MD;  Location: [facility];  Service: Endoscopy;;  . CHOLECYSTECTOMY    . ESOPHAGOGASTRODUODENOSCOPY (EGD) WITH PROPOFOL N/A 04/05/2018   Procedure: ESOPHAGOGASTRODUODENOSCOPY (EGD) WITH PROPOFOL;  Surgeon: Ronald Lobo, MD;  Location: [facility];  Service: Endoscopy;  Laterality: N/A;  . IR IMAGING GUIDED PORT INSERTION  09/13/2019  . IR RADIOLOGIST EVAL & MGMT  10/11/2018   Social History:  reports that he has never smoked. He has never used smokeless tobacco. He reports that he does not drink alcohol and does not use drugs.  Allergies  Allergen Reactions  . Dextromethorphan-Guaifenesin Other (See Comments)    Irregular heartbeat   . Doxycycline Anaphylaxis, Nausea And Vomiting, Rash and Other (See Comments)    "heart arrythmia" and "dyspepsia" (only oral doxycycline causes reaction)  . Gadobutrol Hives and Other (See Comments)    Patient had MRI scan at [place]. Patient called one hour after he left imaging facility to report two "blisters" that came up on "back" of lip.    . Gadolinium Derivatives Hives    Patient had MRI scan at [place]. Patient  called one hour after he left imaging facility to report two "blisters" that came up on "back" of lip.   . Guaifenesin Palpitations       . Ibuprofen Hives and Itching  . Lisinopril Other (See Comments)    Angioedema  . Pantoprazole Itching  . Tramadol Hives and Itching  . Barium Rash    Developed redness around neck after drinking 1st bottle of Barocat; pt was given Benedryl by ED Mds to be "on the safe side" before drinking 2nd bottle   Family History  Problem Relation Age of Onset  . Diabetes Mother   . Heart disease Mother   . Heart failure Mother   . Heart attack Mother   . Hyperlipidemia Mother   . Hypertension Mother   . Hyperlipidemia Father   . Allergic rhinitis Father   . Rashes / Skin problems Father   . Sudden death Neg Hx   . Thyroid disease Neg Hx    Family history: Family history reviewed and not pertinent.  Prior to Admission medications   Medication Sig Start Date End Date Taking? Authorizing Provider  apixaban (ELIQUIS) 5 MG TABS tablet Take 1 tablet (5 mg total) by mouth 2 (two) times daily. 01/25/20  [name], MD  azithromycin (ZITHROMAX Z-PAK) 250 MG tablet Take as directed 01/31/20   [name], MD  baclofen (LIORESAL) 10 MG tablet TAKE 1 TABLET BY MOUTH THREE TIMES DAILY Patient taking differently: Take 10 mg by mouth 3 (three) times daily as needed for muscle spasms. 01/13/20   [name], MD  cloNIDine (CATAPRES) 0.1 MG tablet Take 0.1 mg by mouth 2 (two) times daily as needed (high blood pressure).  09/06/19   [provider]  cyclobenzaprine (FLEXERIL) 10 MG tablet Take 1 tablet (10 mg total) by mouth 3 (three) times daily as needed for muscle spasms. 01/31/20   [name], MD  EPINEPHrine (EPIPEN 2-PAK) 0.3 mg/0.3 mL IJ SOAJ injection USE AS DIRECTED FOR LIFE THREATENING ALLERGIC REACTIONS 04/30/15   Kozlow, Donnamarie Poag, MD  fluconazole (DIFLUCAN) 100 MG tablet Take 1 tablet (100 mg total) by mouth daily. 02/05/20   [name], MD  furosemide (LASIX) 80 MG tablet TAKE 1 TABLET (80 MG TOTAL) BY MOUTH DAILY. Patient taking differently: Take 80 mg by mouth daily as needed for fluid. 11/24/19   [name], MD  gabapentin (NEURONTIN) 300 MG capsule TAKE 1 CAPSULE (300 MG TOTAL) BY MOUTH 3 (THREE) TIMES DAILY. 11/13/19   [name], MD  JANSSEN COVID-19 VACCINE 0.5 ML injection  11/14/19   [provider]  levothyroxine (SYNTHROID) 200 MCG tablet Take 200 mcg by mouth daily. Take along with 88 mcg=288 mcg 10/16/19   [provider]  levothyroxine (SYNTHROID) 88 MCG tablet Take 88 mcg by mouth daily before breakfast. Take along with 200 mcg=288 mcg 10/25/19   [provider]  lidocaine-prilocaine (EMLA) cream Apply to affected area once 12/05/19   Ennever, Rudell Cobb, MD  LORazepam (ATIVAN) 0.5 MG tablet Take 1 tablet (0.5 mg total) by mouth every 6 (six) hours as needed (Nausea or vomiting). 01/25/20 04/24/20  [name], MD  losartan (COZAAR) 25 MG tablet Take 1 tablet (25 mg total) by mouth 2 (two) times daily. Patient taking differently: Take 25 mg by mouth 2 (two) times daily as needed (high blood pressure). 03/02/19   Park Liter, MD  magic mouthwash SOLN Take 10 mLs by mouth 4 (four) times daily as needed for mouth [name]. 10/06/19   [name], MD  methylphenidate (RITALIN) 10 MG tablet Take 1 tablet (10 mg total) by mouth 2 (two) times daily. Patient taking differently: Take 10 mg by mouth daily. 11/27/19   [name], MD  metoCLOPramide (REGLAN) 10 MG tablet TAKE 1 TABLET BY MOUTH FOUR TIMES DAILY BEFORE MEALS AND AT BEDTIME Patient taking differently: Take 10 mg by mouth every 6 (six) hours as needed for nausea or vomiting. 06/01/19   [name], MD  metolazone (ZAROXOLYN) 5 MG tablet TAKE 1 TABLET (5 MG TOTAL) BY MOUTH DAILY. TAKE 1 HOUR BEFORE LASIX. 11/24/19   [name], MD  Multiple Vitamin (MULTIVITAMIN) capsule Take 1 capsule by mouth daily.     [provider]  OLANZapine (ZYPREXA) 10 MG tablet Take 10 mg by mouth at bedtime. 01/18/20   [provider]  oxyCODONE (OXYCONTIN) 20 mg 12 hr tablet Take 1 tablet (20 mg total) by mouth every 12 (twelve) hours. 01/25/20   [name], MD  Oxycodone HCl 10 MG TABS Take 1 tablet (10 mg total) by mouth every 6 (six) hours as needed. 01/25/20   [name], MD  potassium chloride (  KLOR-CON) 10 MEQ tablet TAKE 1 TABLET (10 MEQ TOTAL) BY MOUTH DAILY. 11/13/19   [place], Holli Humbles, NP  prochlorperazine (COMPAZINE) 10 MG tablet Take 1 tablet (10 mg total) by mouth every 6 (six) hours as needed (Nausea or vomiting). 12/05/19   [name], MD  sucralfate (CARAFATE) 1 GM/10ML suspension Take 10 mLs (1 g total) by mouth 4 (four) times daily -  with meals and at bedtime. Patient taking differently: Take 1 g by mouth 4 (four) times daily as needed (prevent ulcers). 10/05/19   [name], MD   Physical Exam: Vitals:   02/08/20 2000 02/08/20 2030 02/08/20 2158 02/09/20 0043  BP: 124/77 (!) 112/93 (!) 147/84 121/78  Pulse: (!) 117 (!) 118 (!) 124 (!) 128  Resp: 20 (!) 24 20 20   Temp: 99 F (37.2 C)  (!) 101.4 F (38.6 C) (!) 100.9 F (38.3 C)  TempSrc: Oral  Oral   SpO2: 93% 96% 94% 92%  Weight:      Height:       Constitutional: appears older than chronological age, NAD, calm, comfortable Eyes: PERRL, lids and conjunctivae normal ENMT: Mucous membranes are moist. Posterior pharynx clear of any exudate or lesions. Age-appropriate dentition. Hearing appropriate Neck: normal, supple, no masses, no thyromegaly, left cath in place and negative for signs of infection Respiratory: clear to auscultation bilaterally, no wheezing, no crackles. Normal respiratory effort. No accessory muscle use.  Cardiovascular: Regular rate and rhythm, no murmurs / rubs / gallops.  Bilateral 2+ pitting edema, right greater than left, 2+ pedal pulses. No carotid bruits.  Abdomen: Obese  abdomen, right sided tenderness, Bowel sounds positive.  Musculoskeletal: no clubbing / cyanosis. No joint deformity upper and lower extremities. Good ROM, no contractures, no atrophy. Normal muscle tone.  Skin: no rashes, lesions, ulcers. No induration Neurologic: Sensation intact. Strength 5/5 in all 4.  Psychiatric: Normal judgment and insight. Alert and oriented x 3. Normal mood.   EKG: Independently reviewed, showing sinus tachycardia, with a rate of 136, QTc 431  Chest x-ray on Admission: Personally reviewed and I agree with radiologist reading as below.  CT Head Wo Contrast  Result Date: 02/08/2020 CLINICAL DATA:  Right-sided back and abdominal [name], fever, cough, left facial numbness, history of metastatic thyroid cancer EXAM: CT HEAD WITHOUT CONTRAST TECHNIQUE: Contiguous axial images were obtained from the base of the skull through the vertex without intravenous contrast. COMPARISON:  01/23/2020 FINDINGS: Brain: No acute infarct or hemorrhage. Lateral ventricles and midline structures are unremarkable. No acute extra-axial fluid collections. No mass effect. Vascular: No hyperdense vessel or unexpected calcification. Skull: Normal. Negative for fracture or focal lesion. Sinuses/Orbits: Mucosal thickening ethmoid air cells. Other: None. IMPRESSION: 1. Stable head CT, no acute process. Electronically Signed   By: Randa Ngo M.D.   On: 02/08/2020 15:54   CT Abdomen Pelvis W Contrast  Result Date: 02/08/2020 CLINICAL DATA:  RIGHT-side abdominal [name] radiating to lower abdomen in mid back, has redness at flanks, fever, cough, shortness of breath, episodes of falling asleep; past history of metastatic thyroid cancer, hypertension, kidney stones EXAM: CT ABDOMEN AND PELVIS WITH CONTRAST TECHNIQUE: Multidetector CT imaging of the abdomen and pelvis was performed using the standard protocol following bolus administration of intravenous contrast. Sagittal and coronal MPR images reconstructed  from axial data set. CONTRAST:  35m OMNIPAQUE IOHEXOL 300 MG/ML SOLN IV. No oral contrast. COMPARISON:  07/27/2019 FINDINGS: Lower chest: Minimal basilar atelectasis Hepatobiliary: Ill-defined masses in RIGHT lobe of liver consistent  with metastatic disease. These are less well-defined than on the previous exam due to suboptimal contrast. Gallbladder surgically absent. Pancreas: Atrophic pancreas without mass Spleen: Normal appearance Adrenals/Urinary Tract: Adrenal glands, kidneys, ureters, bladder normal appearance Stomach/Bowel: Normal appendix. Stool throughout colon. Stomach and bowel loops otherwise normal appearance Vascular/Lymphatic: Enlarged portal caval lymph node 4.3 cm short axis image 32, new. No additional adenopathy. Vascular structures grossly patent Reproductive: Unremarkable prostate gland and seminal vesicles Other: No free air or free fluid. Tiny umbilical hernia containing fat. Nonspecific subcutaneous infiltrative changes in the RIGHT mid abdominal wall, may represent medication injection sites. Musculoskeletal: No acute osseous findings. IMPRESSION: Ill-defined masses in RIGHT lobe of liver consistent with metastatic disease, difficult to compared to a previous exam due to suboptimal liver enhancement on current study, suspect stable to minimally improved. Enlarged portal caval lymph node 4.3 cm short axis, new concerning for metastatic disease. Tiny umbilical hernia containing fat. Increased stool throughout colon. Electronically Signed   By: Lavonia Dana M.D.   On: 02/08/2020 16:03   DG Chest Portable 1 View  Result Date: 02/08/2020 CLINICAL DATA:  Shortness of breath with fever and tachycardia. EXAM: PORTABLE CHEST 1 VIEW COMPARISON:  01/13/2020 FINDINGS: 1434 hours. Low volume lordotic film. Cardiopericardial silhouette is at upper limits of normal for size. The lungs are clear without focal pneumonia, edema, pneumothorax or pleural effusion. Left Port-A-Cath tip overlies the SVC/RA  junction. Telemetry leads overlie the chest. IMPRESSION: Low volume film without acute cardiopulmonary findings. Electronically Signed   By: Misty Stanley M.D.   On: 02/08/2020 14:55   Labs on Admission: I have personally reviewed following labs  CBC: Recent Labs  Lab 02/08/20 1330 02/08/20 1425  WBC 4.9 4.4  NEUTROABS 3.2 3.1  HGB 10.1* 10.5*  HCT 31.3* 32.6*  MCV 102.0* 101.6*  PLT 202 158   Basic Metabolic Panel: Recent Labs  Lab 02/08/20 1330 02/08/20 1425  NA 137 135  K 3.4* 3.3*  CL 96* 95*  CO2 30 28  GLUCOSE 136* 166*  BUN 35* 35*  CREATININE 2.30* 2.36*  CALCIUM 9.6 9.2   GFR: Estimated Creatinine Clearance: 53.9 mL/min (A) (by C-G formula based on SCr of 2.36 mg/dL (H)). Liver Function Tests: Recent Labs  Lab 02/08/20 1330 02/08/20 1425  AST 27 33  ALT 28 32  ALKPHOS 68 76  BILITOT 0.9 1.0  PROT 6.1* 6.3*  ALBUMIN 3.9 3.8   Coagulation Profile: Recent Labs  Lab 02/08/20 1425  INR 1.5*   Urine analysis:    Component Value Date/Time   COLORURINE YELLOW 02/08/2020 1710   APPEARANCEUR CLEAR 02/08/2020 1710   LABSPEC 1.025 02/08/2020 1710   PHURINE 5.0 02/08/2020 1710   GLUCOSEU NEGATIVE 02/08/2020 1710   HGBUR NEGATIVE 02/08/2020 1710   BILIRUBINUR NEGATIVE 02/08/2020 1710   BILIRUBINUR negative 06/11/2013 1309   KETONESUR NEGATIVE 02/08/2020 1710   PROTEINUR NEGATIVE 02/08/2020 1710   UROBILINOGEN 0.2 06/12/2013 0149   NITRITE NEGATIVE 02/08/2020 1710   LEUKOCYTESUR NEGATIVE 02/08/2020 1710   Christoher Drudge N Jenaro Souder D.O. [facility]  If 7PM-7AM, please contact overnight-coverage provider If 7AM-7PM, please contact day coverage provider [email or web address]  02/09/2020, 3:44 AM

## 2020-02-08 NOTE — Progress Notes (Signed)
Pharmacy Antibiotic Note  Brian [name] is a 44 y.o. male admitted on 02/08/2020 with sepsis.  Pharmacy has been consulted for vancomycin and cefepime dosing. Pt is febrile with Tmax 102.1 and WBC is WNL. SCr is elevated above baseline at 2.36.   Plan: Vancomycin 2gm then 1500mg  IV Q24H Cefepime 2gm IV Q12H F/u renal fxn, C&S, clinical status and trough at SS  Height: 5\' 10"  (177.8 cm) Weight: 129 kg (284 lb 6.3 oz) IBW/kg (Calculated) : 73  Temp (24hrs), Avg:101.5 F (38.6 C), Min:100.8 F (38.2 C), Max:102.1 F (38.9 C)  Recent Labs  Lab 02/08/20 1330 02/08/20 1425  WBC 4.9 4.4  CREATININE 2.30* 2.36*  LATICACIDVEN  --  1.6    Estimated Creatinine Clearance: 53.9 mL/min (A) (by C-G formula based on SCr of 2.36 mg/dL (H)).    Allergies  Allergen Reactions  . Dextromethorphan-Guaifenesin Other (See Comments)    Irregular heartbeat   . Doxycycline Anaphylaxis, Nausea And Vomiting, Rash and Other (See Comments)    "heart arrythmia" and "dyspepsia" (only oral doxycycline causes reaction)  . Gadobutrol Hives and Other (See Comments)    Patient had MRI scan at [place]. Patient called one hour after he left imaging facility to report two "blisters" that came up on "back" of lip.    . Gadolinium Derivatives Hives    Patient had MRI scan at [place]. Patient called one hour after he left imaging facility to report two "blisters" that came up on "back" of lip.   . Guaifenesin Palpitations       . Ibuprofen Hives and Itching  . Lisinopril Other (See Comments)    Angioedema  . Pantoprazole Itching  . Tramadol Hives and Itching  . Barium Rash    Developed redness around neck after drinking 1st bottle of Barocat; pt was given Benedryl by ED Mds to be "on the safe side" before drinking 2nd bottle    Antimicrobials this admission: Vanc 12/23>> Cefepime 12/23>> Flagyl x 1 12/23  Dose adjustments this admission: N/A  Microbiology  results: Pending  Thank you for allowing pharmacy to be a part of this patient's care.  [name], [name] 02/08/2020 3:15 PM

## 2020-02-08 NOTE — ED Notes (Signed)
Reports pain in the R side of abd. That radiates to the lower and mid back area.  Pt. Has noted redness in the R side and the L side of his flank areas with noted spotted redness.  Pt in no resp. Distress.  Pt. Has had episodes of falling asleep he reports.  Pt. Reports he also feels some less sensation on the L side of his face.  Pt. Feels like he has been drifting to the L at times.

## 2020-02-08 NOTE — Sepsis Progress Note (Signed)
Sepsis protocol monitoring complete.

## 2020-02-08 NOTE — ED Notes (Signed)
Patient transported to CT 

## 2020-02-08 NOTE — Patient Instructions (Signed)

## 2020-02-08 NOTE — ED Triage Notes (Addendum)
Pt was upstairs to receive chemo at Dr. Antonieta Pert office. Was found to febrile and tachycardic. Pt c/o SOB with exertion. Staff states pt also seemed "out of it". Pt also endorses cough. Pt also states his RUQ hurts more than usual.

## 2020-02-09 ENCOUNTER — Observation Stay (HOSPITAL_BASED_OUTPATIENT_CLINIC_OR_DEPARTMENT_OTHER): Payer: 59

## 2020-02-09 ENCOUNTER — Observation Stay (HOSPITAL_COMMUNITY): Payer: 59

## 2020-02-09 DIAGNOSIS — C73 Malignant neoplasm of thyroid gland: Secondary | ICD-10-CM

## 2020-02-09 DIAGNOSIS — R0602 Shortness of breath: Secondary | ICD-10-CM

## 2020-02-09 DIAGNOSIS — N179 Acute kidney failure, unspecified: Secondary | ICD-10-CM | POA: Diagnosis not present

## 2020-02-09 DIAGNOSIS — Z86718 Personal history of other venous thrombosis and embolism: Secondary | ICD-10-CM | POA: Diagnosis not present

## 2020-02-09 DIAGNOSIS — A419 Sepsis, unspecified organism: Secondary | ICD-10-CM | POA: Diagnosis not present

## 2020-02-09 DIAGNOSIS — I10 Essential (primary) hypertension: Secondary | ICD-10-CM | POA: Diagnosis not present

## 2020-02-09 DIAGNOSIS — R6520 Severe sepsis without septic shock: Secondary | ICD-10-CM

## 2020-02-09 DIAGNOSIS — N200 Calculus of kidney: Secondary | ICD-10-CM | POA: Diagnosis not present

## 2020-02-09 DIAGNOSIS — E039 Hypothyroidism, unspecified: Secondary | ICD-10-CM | POA: Diagnosis present

## 2020-02-09 LAB — ECHOCARDIOGRAM COMPLETE
Area-P 1/2: 5.54 cm2
Calc EF: 60.1 %
Height: 70 in
S' Lateral: 4.4 cm
Single Plane A2C EF: 52.3 %
Single Plane A4C EF: 65.9 %
Weight: 4550.29 oz

## 2020-02-09 LAB — CBC
HCT: 28.2 % — ABNORMAL LOW (ref 39.0–52.0)
Hemoglobin: 9.1 g/dL — ABNORMAL LOW (ref 13.0–17.0)
MCH: 33.2 pg (ref 26.0–34.0)
MCHC: 32.3 g/dL (ref 30.0–36.0)
MCV: 102.9 fL — ABNORMAL HIGH (ref 80.0–100.0)
Platelets: 155 10*3/uL (ref 150–400)
RBC: 2.74 MIL/uL — ABNORMAL LOW (ref 4.22–5.81)
RDW: 18.2 % — ABNORMAL HIGH (ref 11.5–15.5)
WBC: 3.9 10*3/uL — ABNORMAL LOW (ref 4.0–10.5)
nRBC: 0 % (ref 0.0–0.2)

## 2020-02-09 LAB — COMPREHENSIVE METABOLIC PANEL
ALT: 22 U/L (ref 0–44)
AST: 25 U/L (ref 15–41)
Albumin: 3.2 g/dL — ABNORMAL LOW (ref 3.5–5.0)
Alkaline Phosphatase: 68 U/L (ref 38–126)
Anion gap: 10 (ref 5–15)
BUN: 29 mg/dL — ABNORMAL HIGH (ref 6–20)
CO2: 32 mmol/L (ref 22–32)
Calcium: 8.4 mg/dL — ABNORMAL LOW (ref 8.9–10.3)
Chloride: 94 mmol/L — ABNORMAL LOW (ref 98–111)
Creatinine, Ser: 1.82 mg/dL — ABNORMAL HIGH (ref 0.61–1.24)
GFR, Estimated: 46 mL/min — ABNORMAL LOW (ref >60)
Glucose, Bld: 113 mg/dL — ABNORMAL HIGH (ref 70–99)
Potassium: 2.9 mmol/L — ABNORMAL LOW (ref 3.5–5.1)
Sodium: 136 mmol/L (ref 135–145)
Total Bilirubin: 1.1 mg/dL (ref 0.3–1.2)
Total Protein: 5.5 g/dL — ABNORMAL LOW (ref 6.5–8.1)

## 2020-02-09 LAB — PROTIME-INR
INR: 1.6 — ABNORMAL HIGH (ref 0.8–1.2)
Prothrombin Time: 18.6 seconds — ABNORMAL HIGH (ref 11.4–15.2)

## 2020-02-09 LAB — CORTISOL-AM, BLOOD: Cortisol - AM: 18.2 ug/dL (ref 6.7–22.6)

## 2020-02-09 LAB — OSMOLALITY, URINE: Osmolality, Ur: 311 mOsm/kg (ref 300–900)

## 2020-02-09 LAB — PROCALCITONIN: Procalcitonin: 0.47 ng/mL

## 2020-02-09 LAB — SODIUM, URINE, RANDOM: Sodium, Ur: 91 mmol/L

## 2020-02-09 LAB — MAGNESIUM: Magnesium: 1.4 mg/dL — ABNORMAL LOW (ref 1.7–2.4)

## 2020-02-09 MED ORDER — SODIUM CHLORIDE 0.9 % IV SOLN
2.0000 g | Freq: Three times a day (TID) | INTRAVENOUS | Status: DC
  Administered 2020-02-09 – 2020-02-13 (×12): 2 g via INTRAVENOUS
  Filled 2020-02-09 (×13): qty 2

## 2020-02-09 MED ORDER — SODIUM CHLORIDE 0.9 % IV SOLN: 2.0000 g | Freq: Once | INTRAVENOUS | Status: DC

## 2020-02-09 MED ORDER — OXYCODONE-ACETAMINOPHEN 5-325 MG PO TABS
1.0000 | ORAL_TABLET | ORAL | Status: DC | PRN
  Administered 2020-02-09 – 2020-02-11 (×6): 2 via ORAL
  Filled 2020-02-09: qty 2
  Filled 2020-02-09: qty 1
  Filled 2020-02-09: qty 2
  Filled 2020-02-09: qty 1
  Filled 2020-02-09 (×3): qty 2
  Filled 2020-02-09: qty 1

## 2020-02-09 MED ORDER — METRONIDAZOLE 500 MG PO TABS
500.0000 mg | ORAL_TABLET | Freq: Three times a day (TID) | ORAL | Status: DC
  Administered 2020-02-09 – 2020-02-11 (×8): 500 mg via ORAL
  Filled 2020-02-09 (×8): qty 1

## 2020-02-09 MED ORDER — LORAZEPAM 0.5 MG PO TABS: 0.5000 mg | ORAL_TABLET | Freq: Four times a day (QID) | ORAL | Status: DC | PRN

## 2020-02-09 MED ORDER — SODIUM CHLORIDE 0.9% FLUSH
10.0000 mL | Freq: Two times a day (BID) | INTRAVENOUS | Status: DC
  Administered 2020-02-10 – 2020-02-11 (×2): 10 mL

## 2020-02-09 MED ORDER — FUROSEMIDE 10 MG/ML IJ SOLN
100.0000 mg | Freq: Once | INTRAVENOUS | Status: AC
  Administered 2020-02-09: 100 mg via INTRAVENOUS
  Filled 2020-02-09: qty 10

## 2020-02-09 MED ORDER — LEVOTHYROXINE SODIUM 88 MCG PO TABS
88.0000 ug | ORAL_TABLET | Freq: Every day | ORAL | Status: DC
  Administered 2020-02-09 – 2020-02-11 (×3): 88 ug via ORAL
  Filled 2020-02-09 (×4): qty 1

## 2020-02-09 MED ORDER — CLONIDINE HCL 0.1 MG PO TABS: 0.1000 mg | ORAL_TABLET | Freq: Two times a day (BID) | ORAL | Status: DC | PRN

## 2020-02-09 MED ORDER — LEVOTHYROXINE SODIUM 100 MCG PO TABS
200.0000 ug | ORAL_TABLET | Freq: Every day | ORAL | Status: DC
  Administered 2020-02-09 – 2020-02-13 (×5): 200 ug via ORAL
  Filled 2020-02-09 (×5): qty 2

## 2020-02-09 MED ORDER — ACETAMINOPHEN 325 MG PO TABS: 325.0000 mg | ORAL_TABLET | Freq: Four times a day (QID) | ORAL | Status: DC | PRN

## 2020-02-09 MED ORDER — OLANZAPINE 5 MG PO TABS
10.0000 mg | ORAL_TABLET | Freq: Every day | ORAL | Status: DC
Start: 2020-02-09 — End: 2020-02-13
  Administered 2020-02-09 – 2020-02-12 (×5): 10 mg via ORAL
  Filled 2020-02-09 (×5): qty 2

## 2020-02-09 MED ORDER — ACETAMINOPHEN 650 MG RE SUPP: 325.0000 mg | Freq: Four times a day (QID) | RECTAL | Status: DC | PRN

## 2020-02-09 MED ORDER — OXYCODONE HCL ER 20 MG PO T12A
20.0000 mg | EXTENDED_RELEASE_TABLET | Freq: Two times a day (BID) | ORAL | Status: DC
  Administered 2020-02-09 – 2020-02-13 (×10): 20 mg via ORAL
  Filled 2020-02-09 (×10): qty 1

## 2020-02-09 MED ORDER — METHYLPHENIDATE HCL 10 MG PO TABS
10.0000 mg | ORAL_TABLET | Freq: Every day | ORAL | Status: DC
  Administered 2020-02-09 – 2020-02-13 (×5): 10 mg via ORAL
  Filled 2020-02-09 (×5): qty 1

## 2020-02-09 MED ORDER — METOCLOPRAMIDE HCL 10 MG PO TABS: 10.0000 mg | ORAL_TABLET | Freq: Four times a day (QID) | ORAL | Status: DC | PRN

## 2020-02-09 MED ORDER — POTASSIUM CHLORIDE CRYS ER 20 MEQ PO TBCR
40.0000 meq | EXTENDED_RELEASE_TABLET | Freq: Once | ORAL | Status: AC
  Administered 2020-02-09: 40 meq via ORAL
  Filled 2020-02-09: qty 2

## 2020-02-09 MED ORDER — FLUCONAZOLE 100 MG PO TABS
100.0000 mg | ORAL_TABLET | Freq: Every day | ORAL | Status: DC
  Administered 2020-02-09 – 2020-02-13 (×5): 100 mg via ORAL
  Filled 2020-02-09 (×6): qty 1

## 2020-02-09 MED ORDER — MAGIC MOUTHWASH
10.0000 mL | Freq: Four times a day (QID) | ORAL | Status: DC | PRN
  Filled 2020-02-09: qty 10

## 2020-02-09 MED ORDER — POTASSIUM CHLORIDE 10 MEQ/100ML IV SOLN
10.0000 meq | INTRAVENOUS | Status: AC
  Administered 2020-02-09 (×2): 10 meq via INTRAVENOUS
  Filled 2020-02-09 (×2): qty 100

## 2020-02-09 MED ORDER — CHLORHEXIDINE GLUCONATE CLOTH 2 % EX PADS
6.0000 | MEDICATED_PAD | Freq: Every day | CUTANEOUS | Status: DC
  Administered 2020-02-09 – 2020-02-10 (×2): 6 via TOPICAL

## 2020-02-09 MED ORDER — SODIUM CHLORIDE 0.9% FLUSH: 10.0000 mL | INTRAVENOUS | Status: DC | PRN

## 2020-02-09 MED ORDER — FUROSEMIDE 40 MG PO TABS: 80.0000 mg | ORAL_TABLET | Freq: Every day | ORAL | Status: DC | PRN

## 2020-02-09 MED ORDER — POTASSIUM CHLORIDE CRYS ER 20 MEQ PO TBCR
20.0000 meq | EXTENDED_RELEASE_TABLET | Freq: Once | ORAL | Status: AC
  Administered 2020-02-09: 20 meq via ORAL
  Filled 2020-02-09: qty 1

## 2020-02-09 MED ORDER — VANCOMYCIN HCL IN DEXTROSE 1-5 GM/200ML-% IV SOLN: 1000.0000 mg | Freq: Once | INTRAVENOUS | Status: DC

## 2020-02-09 MED ORDER — MAGNESIUM SULFATE 50 % IJ SOLN
3.0000 g | Freq: Once | INTRAVENOUS | Status: AC
  Administered 2020-02-09: 3 g via INTRAVENOUS
  Filled 2020-02-09: qty 6

## 2020-02-09 MED ORDER — ONDANSETRON HCL 4 MG/2ML IJ SOLN: 4.0000 mg | Freq: Four times a day (QID) | INTRAMUSCULAR | Status: DC | PRN

## 2020-02-09 MED ORDER — ACETAMINOPHEN 325 MG PO TABS
650.0000 mg | ORAL_TABLET | Freq: Once | ORAL | Status: AC
  Administered 2020-02-09: 650 mg via ORAL
  Filled 2020-02-09: qty 2

## 2020-02-09 MED ORDER — ONDANSETRON HCL 4 MG PO TABS: 4.0000 mg | ORAL_TABLET | Freq: Four times a day (QID) | ORAL | Status: DC | PRN

## 2020-02-09 MED ORDER — HYDRALAZINE HCL 25 MG PO TABS
25.0000 mg | ORAL_TABLET | Freq: Four times a day (QID) | ORAL | Status: AC | PRN
Start: 2020-02-09 — End: 2020-02-11

## 2020-02-09 MED ORDER — HYDROMORPHONE HCL 1 MG/ML IJ SOLN
1.0000 mg | INTRAMUSCULAR | Status: AC | PRN
  Administered 2020-02-09 (×4): 1 mg via INTRAVENOUS
  Filled 2020-02-09 (×4): qty 1

## 2020-02-09 MED ORDER — GABAPENTIN 300 MG PO CAPS
300.0000 mg | ORAL_CAPSULE | Freq: Three times a day (TID) | ORAL | Status: DC
  Administered 2020-02-09 – 2020-02-13 (×13): 300 mg via ORAL
  Filled 2020-02-09 (×13): qty 1

## 2020-02-09 MED ORDER — APIXABAN 5 MG PO TABS
5.0000 mg | ORAL_TABLET | Freq: Two times a day (BID) | ORAL | Status: DC
  Administered 2020-02-09 – 2020-02-13 (×10): 5 mg via ORAL
  Filled 2020-02-09 (×10): qty 1

## 2020-02-09 NOTE — Progress Notes (Signed)
  Echocardiogram 2D Echocardiogram has been performed.  Brian [name] M 02/09/2020, 8:51 AM

## 2020-02-09 NOTE — Progress Notes (Signed)
Patient requests that bed alarm be turned off. Patient was educated and still insisted that ned alarm be turned off. Patient stated that he feels well enough to make the decision and will call if he needs help getting out of bed.

## 2020-02-09 NOTE — Progress Notes (Signed)
°   02/08/20 2158  Assess: MEWS Score  Temp (!) 101.4 F (38.6 C)  BP (!) 147/84  Pulse Rate (!) 124  Resp 20  SpO2 94 %  O2 Device Room Air  Assess: MEWS Score  MEWS Temp 1  MEWS Systolic 0  MEWS Pulse 2  MEWS RR 0  MEWS LOC 0  MEWS Score 3  MEWS Score Color Yellow  Assess: if the MEWS score is Yellow or Red  Were vital signs taken at a resting state? Yes  Focused Assessment No change from prior assessment  Early Detection of Sepsis Score *See Row Information* Medium  MEWS guidelines implemented *See Row Information* Yes  Treat  MEWS Interventions Administered prn meds/treatments  Take Vital Signs  Increase Vital Sign Frequency  Yellow: Q 2hr X 2 then Q 4hr X 2, if remains yellow, continue Q 4hrs  Escalate  MEWS: Escalate Yellow: discuss with charge nurse/RN and consider discussing with provider and RRT  Notify: Charge Nurse/RN  Name of Charge Nurse/RN Notified Karrie Doffing, RN  Date Charge Nurse/RN Notified 02/09/20  Time Charge Nurse/RN Notified 2200

## 2020-02-09 NOTE — Progress Notes (Signed)
PROGRESS NOTE    Brian [name]  XBM:[id number] DOB: 1976-02-13 DOA: 02/08/2020 PCP: Sharyne Richters, MD    Chief Complaint  Patient presents with  . Shortness of Breath  . Fever    Brief Narrative:  44 year old gentleman with prior history of metastatic thyroid cancer with mets were s/p chemotherapy, multiple pulmonary embolism history of DVT in the left popliteal vein, hypertension, obesity, anxiety presents to ED with abdominal pain associated with some nausea.  On arrival he was found to be febrile tachycardic, AKI and he was admitted for sepsis of unclear etiology  Assessment & Plan:   Active Problems:   Low back pain   Primary cancer of thyroid with metastasis to other site [facility])   Essential hypertension   Sepsis ([place])   Obesity, Class III, BMI 40-49.9 (morbid obesity) ([place])   Hypothyroidism   AKI (acute kidney injury) ([place])  Sepsis of unclear etiology. Patient was started on broad-spectrum IV antibiotics and cultures are pending at this time.  Urine analysis is negative, chest x-ray is negative for pneumonia, blood cultures and urine cultures are pending at this time. CT of the abdomen and pelvis with contrast is negative for any acute intra abdominal pathology. To be febrile, WBC count at 3.9, procalcitonin is 0.47.    Abdominal Probably secondary to liver mets Pain control with oral oxycodone and IV Dilaudid.   AKI probably secondary to prerenal causes, Ultrasound of the kidneys negative for any hydronephrosis. Hold home losartan and nephrotoxins. Gentle hydration and repeat renal parameters show improvement.     Hypokalemia and hypomagnesemia Replaced.  Hypothyroidism Continue with Synthroid daily.   Anxiety Continue with Ativan.  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   DVT prophylaxis: (Eliquis) Code Status: FULL CODE.  Family Communication: none at bedside.  Disposition:   Status is: Observation  The  patient will require care spanning > 2 midnights and should be moved to inpatient because: Ongoing diagnostic testing needed not appropriate for outpatient work up, IV treatments appropriate due to intensity of illness or inability to take PO and Inpatient level of care appropriate due to severity of illness  Dispo: The patient is from: Home              Anticipated d/c is to: Home              Anticipated d/c date is: 3 days              Patient currently is not medically stable to d/c.       Consultants:   Oncology.    Procedures: none.    Antimicrobials:  Antibiotics Given (last 72 hours)    Date/Time Action Medication Dose Rate   02/08/20 1604 New Bag/Given   metroNIDAZOLE (FLAGYL) IVPB 500 mg 500 mg 100 mL/hr   02/08/20 1712 New Bag/Given   ceFEPIme (MAXIPIME) 2 g in sodium chloride 0.9 % 100 mL IVPB 2 g 200 mL/hr   02/08/20 1817 New Bag/Given   vancomycin (VANCOCIN) IVPB 1000 mg/200 mL premix 1,000 mg 200 mL/hr   02/08/20 2037 New Bag/Given   vancomycin (VANCOCIN) IVPB 1000 mg/200 mL premix 1,000 mg 200 mL/hr   02/09/20 0036 Given   metroNIDAZOLE (FLAGYL) tablet 500 mg 500 mg    02/09/20 0622 Given   metroNIDAZOLE (FLAGYL) tablet 500 mg 500 mg    02/09/20 0272 New Bag/Given   ceFEPIme (MAXIPIME) 2 g in sodium chloride 0.9 % 100 mL IVPB 2  g 200 mL/hr          Subjective: Febrile this am.   Objective: Vitals:   02/09/20 0913 02/09/20 1036 02/09/20 1222 02/09/20 1239  BP:  (!) 112/99  129/86  Pulse:  (!) 126  (!) 127  Resp:  20  18  Temp: (!) 102.9 F (39.4 C) (!) 101.1 F (38.4 C) 100.1 F (37.8 C)   TempSrc: Oral Oral Oral   SpO2:  92%  97%  Weight:      Height:        Intake/Output Summary (Last 24 hours) at 02/09/2020 1243 Last data filed at 02/09/2020 1000 Gross per 24 hour  Intake 3832.06 ml  Output 750 ml  Net 3082.06 ml   Filed Weights   02/08/20 1412  Weight: 129 kg    Examination:  General exam: Appears calm and comfortable   Respiratory system: Clear to auscultation. Respiratory effort normal. Cardiovascular system: S1 & S2 heard, tachycardic.  No JVD,  No pedal edema. Gastrointestinal system: Abdomen is nondistended, soft and nontender.  Normal bowel sounds heard. Central nervous system: Alert and oriented. No focal neurological deficits. Extremities: Symmetric 5 x 5 power. Skin: No rashes, lesions or ulcers Psychiatry:  Mood & affect appropriate.     Data Reviewed: I have personally reviewed following labs and imaging studies  CBC: Recent Labs  Lab 02/08/20 1330 02/08/20 1425 02/09/20 0908  WBC 4.9 4.4 3.9*  NEUTROABS 3.2 3.1  --   HGB 10.1* 10.5* 9.1*  HCT 31.3* 32.6* 28.2*  MCV 102.0* 101.6* 102.9*  PLT 202 212 211    Basic Metabolic Panel: Recent Labs  Lab 02/08/20 1330 02/08/20 1425 02/09/20 0908  NA 137 135 136  K 3.4* 3.3* 2.9*  CL 96* 95* 94*  CO2 30 28 32  GLUCOSE 136* 166* 113*  BUN 35* 35* 29*  CREATININE 2.30* 2.36* 1.82*  CALCIUM 9.6 9.2 8.4*    GFR: Estimated Creatinine Clearance: 69.9 mL/min (A) (by C-G formula based on SCr of 1.82 mg/dL (H)).  Liver Function Tests: Recent Labs  Lab 02/08/20 1330 02/08/20 1425 02/09/20 0908  AST 27 33 25  ALT 28 32 22  ALKPHOS 68 76 68  BILITOT 0.9 1.0 1.1  PROT 6.1* 6.3* 5.5*  ALBUMIN 3.9 3.8 3.2*    CBG: No results for input(s): GLUCAP in the last 168 hours.   Recent Results (from the past 240 hour(s))  Resp Panel by RT-PCR (Flu A&B, Covid) Nasopharyngeal Swab     Status: None   Collection Time: 02/08/20  2:43 PM   Specimen: Nasopharyngeal Swab; Nasopharyngeal(NP) swabs in vial transport medium  Result Value Ref Range Status   SARS Coronavirus 2 by RT PCR NEGATIVE NEGATIVE Final    Comment: (NOTE) SARS-CoV-2 target nucleic acids are NOT DETECTED.  The SARS-CoV-2 RNA is generally detectable in upper respiratory specimens during the acute phase of infection. The lowest concentration of SARS-CoV-2 viral copies  this assay can detect is 138 copies/mL. A negative result does not preclude SARS-Cov-2 infection and should not be used as the sole basis for treatment or other patient management decisions. A negative result may occur with  improper specimen collection/handling, submission of specimen other than nasopharyngeal swab, presence of viral mutation(s) within the areas targeted by this assay, and inadequate number of viral copies(<138 copies/mL). A negative result must be combined with clinical observations, patient history, and epidemiological information. The expected result is Negative.  Fact Sheet for Patients:  EntrepreneurPulse.com.au  Fact Sheet for  Healthcare Providers:  IncredibleEmployment.be  This test is no t yet approved or cleared by the [place] and  has been authorized for detection and/or diagnosis of SARS-CoV-2 by FDA under an Emergency Use Authorization (EUA). This EUA will remain  in effect (meaning this test can be used) for the duration of the COVID-19 declaration under Section 564(b)(1) of the Act, 21 U.S.C.section 360bbb-3(b)(1), unless the authorization is terminated  or revoked sooner.       Influenza A by PCR NEGATIVE NEGATIVE Final   Influenza B by PCR NEGATIVE NEGATIVE Final    Comment: (NOTE) The Xpert Xpress SARS-CoV-2/FLU/RSV plus assay is intended as an aid in the diagnosis of influenza from Nasopharyngeal swab specimens and should not be used as a sole basis for treatment. Nasal washings and aspirates are unacceptable for Xpert Xpress SARS-CoV-2/FLU/RSV testing.  Fact Sheet for Patients: EntrepreneurPulse.com.au  Fact Sheet for Healthcare Providers: IncredibleEmployment.be  This test is not yet approved or cleared by the [place] FDA and has been authorized for detection and/or diagnosis of SARS-CoV-2 by FDA under an Emergency Use Authorization (EUA). This EUA  will remain in effect (meaning this test can be used) for the duration of the COVID-19 declaration under Section 564(b)(1) of the Act, 21 U.S.C. section 360bbb-3(b)(1), unless the authorization is terminated or revoked.  Performed at [facility], [address] [place], [place] [address]          Radiology Studies: CT Head Wo Contrast  Result Date: 02/08/2020 CLINICAL DATA:  Right-sided back and abdominal pain, fever, cough, left facial numbness, history of metastatic thyroid cancer EXAM: CT HEAD WITHOUT CONTRAST TECHNIQUE: Contiguous axial images were obtained from the base of the skull through the vertex without intravenous contrast. COMPARISON:  01/23/2020 FINDINGS: Brain: No acute infarct or hemorrhage. Lateral ventricles and midline structures are unremarkable. No acute extra-axial fluid collections. No mass effect. Vascular: No hyperdense vessel or unexpected calcification. Skull: Normal. Negative for fracture or focal lesion. Sinuses/Orbits: Mucosal thickening ethmoid air cells. Other: None. IMPRESSION: 1. Stable head CT, no acute process. Electronically Signed   By: Randa Ngo M.D.   On: 02/08/2020 15:54   CT Abdomen Pelvis W Contrast  Result Date: 02/08/2020 CLINICAL DATA:  RIGHT-side abdominal pain radiating to lower abdomen in mid back, has redness at flanks, fever, cough, shortness of breath, episodes of falling asleep; past history of metastatic thyroid cancer, hypertension, kidney stones EXAM: CT ABDOMEN AND PELVIS WITH CONTRAST TECHNIQUE: Multidetector CT imaging of the abdomen and pelvis was performed using the standard protocol following bolus administration of intravenous contrast. Sagittal and coronal MPR images reconstructed from axial data set. CONTRAST:  59mL OMNIPAQUE IOHEXOL 300 MG/ML SOLN IV. No oral contrast. COMPARISON:  07/27/2019 FINDINGS: Lower chest: Minimal basilar atelectasis Hepatobiliary: Ill-defined masses in RIGHT lobe of liver consistent  with metastatic disease. These are less well-defined than on the previous exam due to suboptimal contrast. Gallbladder surgically absent. Pancreas: Atrophic pancreas without mass Spleen: Normal appearance Adrenals/Urinary Tract: Adrenal glands, kidneys, ureters, bladder normal appearance Stomach/Bowel: Normal appendix. Stool throughout colon. Stomach and bowel loops otherwise normal appearance Vascular/Lymphatic: Enlarged portal caval lymph node 4.3 cm short axis image 32, new. No additional adenopathy. Vascular structures grossly patent Reproductive: Unremarkable prostate gland and seminal vesicles Other: No free air or free fluid. Tiny umbilical hernia containing fat. Nonspecific subcutaneous infiltrative changes in the RIGHT mid abdominal wall, may represent medication injection sites. Musculoskeletal: No acute osseous findings. IMPRESSION: Ill-defined masses in RIGHT  lobe of liver consistent with metastatic disease, difficult to compared to a previous exam due to suboptimal liver enhancement on current study, suspect stable to minimally improved. Enlarged portal caval lymph node 4.3 cm short axis, new concerning for metastatic disease. Tiny umbilical hernia containing fat. Increased stool throughout colon. Electronically Signed   By: Lavonia Dana M.D.   On: 02/08/2020 16:03   US RENAL  Result Date: 02/09/2020 CLINICAL DATA:  Kidney stone. EXAM: RENAL / URINARY TRACT ULTRASOUND COMPLETE COMPARISON:  CT scan 02/08/2020 FINDINGS: Right Kidney: Renal measurements: 12.1 x 6.5 x 4.4 cm = volume: 179 mL. Echogenicity within normal limits. No mass or hydronephrosis visualized. Left Kidney: Renal measurements: 13.2 x 6.9 x 5.2 cm = volume: 245 mL. Echogenicity within normal limits. No mass or hydronephrosis visualized. Bladder: Appears normal for degree of bladder distention. Other: None. IMPRESSION: 1. No evidence for hydronephrosis. No suspicious abnormality identified in either kidney. Electronically Signed   By:  Misty Stanley M.D.   On: 02/09/2020 08:06   DG Chest Portable 1 View  Result Date: 02/08/2020 CLINICAL DATA:  Shortness of breath with fever and tachycardia. EXAM: PORTABLE CHEST 1 VIEW COMPARISON:  01/13/2020 FINDINGS: 1434 hours. Low volume lordotic film. Cardiopericardial silhouette is at upper limits of normal for size. The lungs are clear without focal pneumonia, edema, pneumothorax or pleural effusion. Left Port-A-Cath tip overlies the SVC/RA junction. Telemetry leads overlie the chest. IMPRESSION: Low volume film without acute cardiopulmonary findings. Electronically Signed   By: Misty Stanley M.D.   On: 02/08/2020 14:55   ECHOCARDIOGRAM COMPLETE  Result Date: 02/09/2020    ECHOCARDIOGRAM REPORT   Patient Name:   Brian [name] Date of Exam: 02/09/2020 Medical Rec #:  [id number]    Height:       70.0 in Accession #:    [id number]   Weight:       284.4 lb Date of Birth:  July 28, 1975    BSA:          2.424 m Patient Age:    51 years     BP:           130/92 mmHg Patient Gender: M            HR:           116 bpm. Exam Location:  Inpatient Procedure: 2D Echo, Color Doppler and Cardiac Doppler                                 MODIFIED REPORT:   This report was modified by Skeet Latch MD on 02/09/2020 due to no wall                               motion abnormality.  Indications:     Dyspnea R06.00  History:         Patient has prior history of Echocardiogram examinations, most                  recent 02/06/2019. Signs/Symptoms:Fever. Primary cancer of                  thyroid with metastasis to other site. Sepsis. Acute kidney                  injury.  Sonographer:     Darlina Sicilian RDCS Referring Phys:  [phone] AMY N COX  Diagnosing Phys: Skeet Latch MD IMPRESSIONS  1. Mild anteroseptal hypokinesis. Left ventricular ejection fraction, by estimation, is 50 to 55%. The left ventricle has low normal function. The left ventricle has no regional wall motion abnormalities. The left ventricular internal  cavity size was mildly dilated. Left ventricular diastolic parameters are consistent with Grade I diastolic dysfunction (impaired relaxation).  2. Right ventricular systolic function is normal. The right ventricular size is normal.  3. The mitral valve is normal in structure. No evidence of mitral valve regurgitation. No evidence of mitral stenosis.  4. The aortic valve is tricuspid. Aortic valve regurgitation is not visualized. No aortic stenosis is present. FINDINGS  Left Ventricle: Mild anteroseptal hypokinesis. Left ventricular ejection fraction, by estimation, is 50 to 55%. The left ventricle has low normal function. The left ventricle has no regional wall motion abnormalities. The left ventricular internal cavity size was mildly dilated. There is no left ventricular hypertrophy. Left ventricular diastolic parameters are consistent with Grade I diastolic dysfunction (impaired relaxation). Right Ventricle: The right ventricular size is normal. No increase in right ventricular wall thickness. Right ventricular systolic function is normal. Left Atrium: Left atrial size was normal in size. Right Atrium: Right atrial size was normal in size. Pericardium: There is no evidence of pericardial effusion. Mitral Valve: The mitral valve is normal in structure. No evidence of mitral valve regurgitation. No evidence of mitral valve stenosis. Tricuspid Valve: The tricuspid valve is normal in structure. Tricuspid valve regurgitation is not demonstrated. No evidence of tricuspid stenosis. Aortic Valve: The aortic valve is tricuspid. Aortic valve regurgitation is not visualized. No aortic stenosis is present. Pulmonic Valve: The pulmonic valve was normal in structure. Pulmonic valve regurgitation is not visualized. No evidence of pulmonic stenosis. Aorta: The aortic root is normal in size and structure. Venous: The inferior vena cava was not well visualized. IAS/Shunts: No atrial level shunt detected by color flow Doppler.  LEFT  VENTRICLE PLAX 2D LVIDd:         5.80 cm LVIDs:         4.40 cm LV PW:         0.90 cm LV IVS:        1.00 cm  LV Volumes (MOD) LV vol d, MOD A2C: 133.0 ml LV vol d, MOD A4C: 124.0 ml LV vol s, MOD A2C: 63.4 ml LV vol s, MOD A4C: 42.3 ml LV SV MOD A2C:     69.6 ml LV SV MOD A4C:     124.0 ml LV SV MOD BP:      78.9 ml RIGHT VENTRICLE RV S prime:     18.20 cm/s LEFT ATRIUM           Index       RIGHT ATRIUM           Index LA diam:      3.20 cm 1.32 cm/m  RA Area:     12.00 cm LA Vol (A4C): 40.4 ml 16.67 ml/m RA Volume:   23.40 ml  9.65 ml/m  AORTIC VALVE LVOT Vmax:   126.00 cm/s LVOT Vmean:  90.500 cm/s LVOT VTI:    0.163 m MITRAL VALVE MV Area (PHT): 5.54 cm    SHUNTS MV Decel Time: 137 msec    Systemic VTI: 0.16 m MV E velocity: 77.60 cm/s MV A velocity: 98.00 cm/s MV E/A ratio:  0.79 Skeet Latch MD Electronically signed by Skeet Latch MD Signature Date/Time: 02/09/2020/9:26:21 AM    Final (Updated)  VAS [place] LOWER EXTREMITY VENOUS (DVT)  Result Date: 02/09/2020  Lower Venous DVT Study Indications: Recent Hx of DVT.  Risk Factors: DVT left pop dvt 2021. Comparison Study: Previous exam 12/2019 Performing Technologist: Vonzell Schlatter RVT  Examination Guidelines: A complete evaluation includes B-mode imaging, spectral Doppler, color Doppler, and power Doppler as needed of all accessible portions of each vessel. Bilateral testing is considered an integral part of a complete examination. Limited examinations for reoccurring indications may be performed as noted. The reflux portion of the exam is performed with the patient in reverse Trendelenburg.  +---------+---------------+---------+-----------+----------+--------------+ RIGHT    CompressibilityPhasicitySpontaneityPropertiesThrombus Aging +---------+---------------+---------+-----------+----------+--------------+ CFV      Full           Yes      Yes                                  +---------+---------------+---------+-----------+----------+--------------+ SFJ      Full                                                        +---------+---------------+---------+-----------+----------+--------------+ FV Prox  Full                                                        +---------+---------------+---------+-----------+----------+--------------+ FV Mid   Full                                                        +---------+---------------+---------+-----------+----------+--------------+ FV DistalFull                                                        +---------+---------------+---------+-----------+----------+--------------+ PFV      Full                                                        +---------+---------------+---------+-----------+----------+--------------+ POP      Full           Yes      Yes                                 +---------+---------------+---------+-----------+----------+--------------+ PTV      Full                                                        +---------+---------------+---------+-----------+----------+--------------+ PERO     Full                                                        +---------+---------------+---------+-----------+----------+--------------+   +---------+---------------+---------+-----------+----------+--------------+  LEFT     CompressibilityPhasicitySpontaneityPropertiesThrombus Aging +---------+---------------+---------+-----------+----------+--------------+ CFV      Full           Yes      Yes                                 +---------+---------------+---------+-----------+----------+--------------+ SFJ      Full                                                        +---------+---------------+---------+-----------+----------+--------------+ FV Prox  Full                                                         +---------+---------------+---------+-----------+----------+--------------+ FV Mid   Full                                                        +---------+---------------+---------+-----------+----------+--------------+ FV DistalFull                                                        +---------+---------------+---------+-----------+----------+--------------+ PFV      Full                                                        +---------+---------------+---------+-----------+----------+--------------+ POP      Full           Yes      Yes                                 +---------+---------------+---------+-----------+----------+--------------+ PTV      Full                                                        +---------+---------------+---------+-----------+----------+--------------+ PERO     Full                                                        +---------+---------------+---------+-----------+----------+--------------+     Summary: RIGHT: - There is no evidence of deep vein thrombosis in the lower extremity.  - No cystic structure found in the popliteal fossa.  LEFT: - There is no evidence of deep vein thrombosis in the lower extremity.  - No  cystic structure found in the popliteal fossa.  *See table(s) above for measurements and observations.    Preliminary         Scheduled Meds: . apixaban  5 mg Oral BID  . Chlorhexidine Gluconate Cloth  6 each Topical Daily  . fluconazole  100 mg Oral Daily  . gabapentin  300 mg Oral TID  . levothyroxine  200 mcg Oral Q0600  . levothyroxine  88 mcg Oral Q0600  . methylphenidate  10 mg Oral Daily  . metroNIDAZOLE  500 mg Oral Q8H  . OLANZapine  10 mg Oral QHS  . oxyCODONE  20 mg Oral Q12H  . potassium chloride  40 mEq Oral Once  . sodium chloride flush  10-40 mL Intracatheter Q12H   Continuous Infusions: . sodium chloride Stopped (02/08/20 1941)  . ceFEPime (MAXIPIME) IV 2 g (02/09/20 5456)  .  potassium chloride    . vancomycin       LOS: 0 days       Hosie Poisson, MD [facility]   To contact the attending provider between 7A-7P or the covering provider during after hours 7P-7A, please log into the web site [email or web address] and access using universal [place] password for that web site. If you do not have the password, please call the hospital operator.  02/09/2020, 12:43 PM

## 2020-02-09 NOTE — Progress Notes (Signed)
Pharmacy Antibiotic Note  Brian [name] is a 44 y.o. male admitted on 02/08/2020 with sepsis.  Pharmacy has been consulted for Vancomycin and Cefepime dosing.   Plan: Continue Vancomycin 1500mg  IV q24h Vancomycin levels at steady state, as indicated Adjust Cefepime to 2g IV q8h Monitor renal function, cultures, clinical course  Height: 5\' 10"  (177.8 cm) Weight: 129 kg (284 lb 6.3 oz) IBW/kg (Calculated) : 73  Temp (24hrs), Avg:100.7 F (38.2 C), Min:98.1 F (36.7 C), Max:102.9 F (39.4 C)  Recent Labs  Lab 02/08/20 1330 02/08/20 1425 02/08/20 1439 02/09/20 0908  WBC 4.9 4.4  --  3.9*  CREATININE 2.30* 2.36*  --  1.82*  LATICACIDVEN  --  1.6 1.1  --     Estimated Creatinine Clearance: 69.9 mL/min (A) (by C-G formula based on SCr of 1.82 mg/dL (H)).    Allergies  Allergen Reactions  . Dextromethorphan-Guaifenesin Other (See Comments)    Irregular heartbeat   . Doxycycline Anaphylaxis, Nausea And Vomiting, Rash and Other (See Comments)    "heart arrythmia" and "dyspepsia" (only oral doxycycline causes reaction)  . Gadobutrol Hives and Other (See Comments)    Patient had MRI scan at [place]. Patient called one hour after he left imaging facility to report two "blisters" that came up on "back" of lip.    . Gadolinium Derivatives Hives    Patient had MRI scan at [place]. Patient called one hour after he left imaging facility to report two "blisters" that came up on "back" of lip.   . Guaifenesin Palpitations       . Ibuprofen Hives and Itching  . Lisinopril Other (See Comments)    Angioedema  . Pantoprazole Itching  . Tramadol Hives and Itching  . Barium Rash    Developed redness around neck after drinking 1st bottle of Barocat; pt was given Benedryl by ED Mds to be "on the safe side" before drinking 2nd bottle    Antimicrobials this admission: 12/23 Vancomycin >> 12/23 Cefepime >> 12/23 Metronidazole >>  PTA Fluconazole >>   Microbiology  results: 12/23 BCx: sent 12/23 Resp panel: negative 12/24 UCx: sent  Thank you for allowing pharmacy to be a part of this patient's care.   Lindell Spar, PharmD, BCPS Clinical Pharmacist  02/09/2020 12:57 PM

## 2020-02-09 NOTE — Progress Notes (Signed)
Bilateral lower extremity venous study completed.      Please see CV Proc for preliminary results.   Marilynn Ekstein, RVT  

## 2020-02-10 DIAGNOSIS — R0602 Shortness of breath: Secondary | ICD-10-CM | POA: Diagnosis not present

## 2020-02-10 DIAGNOSIS — Z87442 Personal history of urinary calculi: Secondary | ICD-10-CM | POA: Diagnosis not present

## 2020-02-10 DIAGNOSIS — R5383 Other fatigue: Secondary | ICD-10-CM | POA: Diagnosis not present

## 2020-02-10 DIAGNOSIS — R531 Weakness: Secondary | ICD-10-CM | POA: Diagnosis not present

## 2020-02-10 DIAGNOSIS — Z85850 Personal history of malignant neoplasm of thyroid: Secondary | ICD-10-CM | POA: Diagnosis not present

## 2020-02-10 DIAGNOSIS — A419 Sepsis, unspecified organism: Secondary | ICD-10-CM | POA: Diagnosis not present

## 2020-02-10 DIAGNOSIS — R6520 Severe sepsis without septic shock: Secondary | ICD-10-CM | POA: Diagnosis not present

## 2020-02-10 DIAGNOSIS — C787 Secondary malignant neoplasm of liver and intrahepatic bile duct: Secondary | ICD-10-CM | POA: Diagnosis not present

## 2020-02-10 DIAGNOSIS — D61818 Other pancytopenia: Secondary | ICD-10-CM | POA: Diagnosis present

## 2020-02-10 DIAGNOSIS — D6481 Anemia due to antineoplastic chemotherapy: Secondary | ICD-10-CM | POA: Diagnosis not present

## 2020-02-10 DIAGNOSIS — F419 Anxiety disorder, unspecified: Secondary | ICD-10-CM | POA: Diagnosis present

## 2020-02-10 DIAGNOSIS — Z79890 Hormone replacement therapy (postmenopausal): Secondary | ICD-10-CM | POA: Diagnosis not present

## 2020-02-10 DIAGNOSIS — I82421 Acute embolism and thrombosis of right iliac vein: Secondary | ICD-10-CM | POA: Diagnosis not present

## 2020-02-10 DIAGNOSIS — I10 Essential (primary) hypertension: Secondary | ICD-10-CM | POA: Diagnosis not present

## 2020-02-10 DIAGNOSIS — E039 Hypothyroidism, unspecified: Secondary | ICD-10-CM | POA: Diagnosis not present

## 2020-02-10 DIAGNOSIS — C799 Secondary malignant neoplasm of unspecified site: Secondary | ICD-10-CM | POA: Diagnosis present

## 2020-02-10 DIAGNOSIS — Z6841 Body Mass Index (BMI) 40.0 and over, adult: Secondary | ICD-10-CM | POA: Diagnosis not present

## 2020-02-10 DIAGNOSIS — N179 Acute kidney failure, unspecified: Secondary | ICD-10-CM | POA: Diagnosis not present

## 2020-02-10 DIAGNOSIS — Z9049 Acquired absence of other specified parts of digestive tract: Secondary | ICD-10-CM | POA: Diagnosis not present

## 2020-02-10 DIAGNOSIS — Z86711 Personal history of pulmonary embolism: Secondary | ICD-10-CM | POA: Diagnosis not present

## 2020-02-10 DIAGNOSIS — Z86718 Personal history of other venous thrombosis and embolism: Secondary | ICD-10-CM | POA: Diagnosis not present

## 2020-02-10 DIAGNOSIS — B370 Candidal stomatitis: Secondary | ICD-10-CM | POA: Diagnosis not present

## 2020-02-10 DIAGNOSIS — I5032 Chronic diastolic (congestive) heart failure: Secondary | ICD-10-CM | POA: Diagnosis present

## 2020-02-10 DIAGNOSIS — C73 Malignant neoplasm of thyroid gland: Secondary | ICD-10-CM | POA: Diagnosis not present

## 2020-02-10 DIAGNOSIS — I110 Hypertensive heart disease with heart failure: Secondary | ICD-10-CM | POA: Diagnosis present

## 2020-02-10 DIAGNOSIS — G893 Neoplasm related pain (acute) (chronic): Secondary | ICD-10-CM | POA: Diagnosis present

## 2020-02-10 DIAGNOSIS — Z7901 Long term (current) use of anticoagulants: Secondary | ICD-10-CM | POA: Diagnosis not present

## 2020-02-10 DIAGNOSIS — D84821 Immunodeficiency due to drugs: Secondary | ICD-10-CM | POA: Diagnosis present

## 2020-02-10 DIAGNOSIS — D701 Agranulocytosis secondary to cancer chemotherapy: Secondary | ICD-10-CM | POA: Diagnosis not present

## 2020-02-10 DIAGNOSIS — R509 Fever, unspecified: Secondary | ICD-10-CM | POA: Diagnosis not present

## 2020-02-10 DIAGNOSIS — Z20822 Contact with and (suspected) exposure to covid-19: Secondary | ICD-10-CM | POA: Diagnosis present

## 2020-02-10 LAB — BASIC METABOLIC PANEL
Anion gap: 11 (ref 5–15)
BUN: 29 mg/dL — ABNORMAL HIGH (ref 6–20)
CO2: 27 mmol/L (ref 22–32)
Calcium: 7.9 mg/dL — ABNORMAL LOW (ref 8.9–10.3)
Chloride: 96 mmol/L — ABNORMAL LOW (ref 98–111)
Creatinine, Ser: 1.62 mg/dL — ABNORMAL HIGH (ref 0.61–1.24)
GFR, Estimated: 53 mL/min — ABNORMAL LOW (ref >60)
Glucose, Bld: 118 mg/dL — ABNORMAL HIGH (ref 70–99)
Potassium: 3 mmol/L — ABNORMAL LOW (ref 3.5–5.1)
Sodium: 134 mmol/L — ABNORMAL LOW (ref 135–145)

## 2020-02-10 LAB — CBC
HCT: 24.5 % — ABNORMAL LOW (ref 39.0–52.0)
Hemoglobin: 7.9 g/dL — ABNORMAL LOW (ref 13.0–17.0)
MCH: 33.1 pg (ref 26.0–34.0)
MCHC: 32.2 g/dL (ref 30.0–36.0)
MCV: 102.5 fL — ABNORMAL HIGH (ref 80.0–100.0)
Platelets: 142 10*3/uL — ABNORMAL LOW (ref 150–400)
RBC: 2.39 MIL/uL — ABNORMAL LOW (ref 4.22–5.81)
RDW: 18 % — ABNORMAL HIGH (ref 11.5–15.5)
WBC: 2.6 10*3/uL — ABNORMAL LOW (ref 4.0–10.5)
nRBC: 0 % (ref 0.0–0.2)

## 2020-02-10 LAB — T4, FREE: Free T4: 2.4 ng/dL — ABNORMAL HIGH (ref 0.61–1.12)

## 2020-02-10 LAB — URINE CULTURE
Culture: NO GROWTH
Culture: NO GROWTH

## 2020-02-10 LAB — MAGNESIUM: Magnesium: 1.9 mg/dL (ref 1.7–2.4)

## 2020-02-10 LAB — EXPECTORATED SPUTUM ASSESSMENT W GRAM STAIN, RFLX TO RESP C

## 2020-02-10 LAB — TSH: TSH: 0.094 u[IU]/mL — ABNORMAL LOW (ref 0.350–4.500)

## 2020-02-10 MED ORDER — HYDROMORPHONE HCL 1 MG/ML IJ SOLN
1.0000 mg | INTRAMUSCULAR | Status: DC | PRN
  Administered 2020-02-10 – 2020-02-11 (×2): 1 mg via INTRAVENOUS
  Filled 2020-02-10 (×2): qty 1

## 2020-02-10 MED ORDER — POTASSIUM CHLORIDE CRYS ER 20 MEQ PO TBCR
40.0000 meq | EXTENDED_RELEASE_TABLET | Freq: Two times a day (BID) | ORAL | Status: AC
  Administered 2020-02-10 (×2): 40 meq via ORAL
  Filled 2020-02-10 (×2): qty 2

## 2020-02-10 MED ORDER — FUROSEMIDE 40 MG PO TABS
40.0000 mg | ORAL_TABLET | Freq: Two times a day (BID) | ORAL | Status: DC
  Administered 2020-02-10 – 2020-02-13 (×5): 40 mg via ORAL
  Filled 2020-02-10 (×6): qty 1

## 2020-02-10 MED ORDER — DIPHENHYDRAMINE HCL 25 MG PO CAPS
25.0000 mg | ORAL_CAPSULE | Freq: Four times a day (QID) | ORAL | Status: DC | PRN
  Administered 2020-02-10 (×2): 25 mg via ORAL
  Filled 2020-02-10 (×2): qty 1

## 2020-02-10 MED ORDER — NYSTATIN 100000 UNIT/ML MT SUSP
5.0000 mL | Freq: Four times a day (QID) | OROMUCOSAL | Status: DC
  Administered 2020-02-10 – 2020-02-13 (×12): 500000 [IU] via ORAL
  Filled 2020-02-10 (×12): qty 5

## 2020-02-10 NOTE — Progress Notes (Signed)
PROGRESS NOTE    HAWTHORNE DAY  GMW:[id number] DOB: 07/27/1975 DOA: 02/08/2020 PCP: Sharyne Richters, MD    Chief Complaint  Patient presents with  . Shortness of Breath  . Fever    Brief Narrative:  44 year old gentleman with prior history of metastatic thyroid cancer with mets were s/p chemotherapy, multiple pulmonary embolism history of DVT in the left popliteal vein, hypertension, obesity, anxiety presents to ED with abdominal pain associated with some nausea.  On arrival he was found to be febrile tachycardic, AKI and he was admitted for sepsis of unclear etiology. Pt seen and examined at bedside.   Assessment & Plan:   Active Problems:   Low back pain   Primary cancer of thyroid with metastasis to other site [facility])   Essential hypertension   Sepsis ([place])   Obesity, Class III, BMI 40-49.9 (morbid obesity) ([place])   Hypothyroidism   AKI (acute kidney injury) ([place])  Sepsis of unclear etiology. Patient was started on broad-spectrum IV antibiotics and cultures are pending at this time.  Urine analysis is negative, chest x-ray is negative for pneumonia, blood cultures and urine cultures are pending at this time. CT of the abdomen and pelvis with contrast is negative for any acute intra abdominal pathology. Fever resolved, . Cultures so far have been negative.  Pt reports oral candidiasis, diflucan added.  He has productive sputum, will order sputum cultures.    Abdominal pain.  Probably secondary to liver mets Pain control with oral oxycodone and IV Dilaudid.   AKI probably secondary to prerenal causes, Ultrasound of the kidneys negative for any hydronephrosis. Hold home losartan and nephrotoxins. Gentle hydration and repeat renal parameters show improvement. Creatinine today is 1.62.    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  Further management as per Dr Marin Olp.    Pancytopenia:  Probably secondary to chemo.  transfuse to  keep hemoglobin greater than 7.     Hypokalemia and hypomagnesemia Replaced.  Hypothyroidism Continue with Synthroid daily. tsh and free t4 ordered.    Anxiety Continue with Ativan.  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   DVT prophylaxis: (Eliquis) Code Status: FULL CODE.  Family Communication: none at bedside.  Disposition:   Status is: Inpatient.   The patient will require care spanning > 2 midnights and should be moved to inpatient because: Ongoing diagnostic testing needed not appropriate for outpatient work up, IV treatments appropriate due to intensity of illness or inability to take PO and Inpatient level of care appropriate due to severity of illness  Dispo: The patient is from: Home              Anticipated d/c is to: Home              Anticipated d/c date is: 3 days              Patient currently is not medically stable to d/c.       Consultants:   Oncology.    Procedures: none.    Antimicrobials:  Antibiotics Given (last 72 hours)    Date/Time Action Medication Dose Rate   02/08/20 1604 New Bag/Given   metroNIDAZOLE (FLAGYL) IVPB 500 mg 500 mg 100 mL/hr   02/08/20 1712 New Bag/Given   ceFEPIme (MAXIPIME) 2 g in sodium chloride 0.9 % 100 mL IVPB 2 g 200 mL/hr   02/08/20 1817 New Bag/Given   vancomycin (VANCOCIN) IVPB 1000  mg/200 mL premix 1,000 mg 200 mL/hr   02/08/20 2037 New Bag/Given   vancomycin (VANCOCIN) IVPB 1000 mg/200 mL premix 1,000 mg 200 mL/hr   02/09/20 0036 Given   metroNIDAZOLE (FLAGYL) tablet 500 mg 500 mg    02/09/20 0622 Given   metroNIDAZOLE (FLAGYL) tablet 500 mg 500 mg    02/09/20 5038 New Bag/Given   ceFEPIme (MAXIPIME) 2 g in sodium chloride 0.9 % 100 mL IVPB 2 g 200 mL/hr   02/09/20 1404 Given   metroNIDAZOLE (FLAGYL) tablet 500 mg 500 mg    02/09/20 1518 New Bag/Given   ceFEPIme (MAXIPIME) 2 g in sodium chloride 0.9 % 100 mL IVPB 2 g 200 mL/hr   02/09/20 2012 New Bag/Given    vancomycin (VANCOREADY) IVPB 1500 mg/300 mL 1,500 mg 150 mL/hr   02/09/20 2246 Given   metroNIDAZOLE (FLAGYL) tablet 500 mg 500 mg    02/09/20 2254 New Bag/Given   ceFEPIme (MAXIPIME) 2 g in sodium chloride 0.9 % 100 mL IVPB 2 g 200 mL/hr   02/10/20 0533 Given   metroNIDAZOLE (FLAGYL) tablet 500 mg 500 mg    02/10/20 0536 New Bag/Given   ceFEPIme (MAXIPIME) 2 g in sodium chloride 0.9 % 100 mL IVPB 2 g 200 mL/hr   02/10/20 1312 Given   metroNIDAZOLE (FLAGYL) tablet 500 mg 500 mg    02/10/20 1315 New Bag/Given   ceFEPIme (MAXIPIME) 2 g in sodium chloride 0.9 % 100 mL IVPB 2 g 200 mL/hr         Subjective:  feeling much better today. Reports having oral candidiasis.  Objective: Vitals:   02/10/20 0459 02/10/20 0947 02/10/20 1430 02/10/20 1431  BP: 130/89 123/82 136/87   Pulse: (!) 108 (!) 115 (!) 115   Resp: 20 20 (!) 22   Temp: (!) 100.4 F (38 C) 100.3 F (37.9 C)  99.4 F (37.4 C)  TempSrc:    Oral  SpO2: 100% 96% 95%   Weight:      Height:        Intake/Output Summary (Last 24 hours) at 02/10/2020 1745 Last data filed at 02/10/2020 1035 Gross per 24 hour  Intake 702.11 ml  Output --  Net 702.11 ml   Filed Weights   02/08/20 1412  Weight: 129 kg    Examination:  General exam:  Alert and comfortable, not in any distress.  Respiratory system: diminished air entry at bases, no wheezing heard, some tachypnea present.  Cardiovascular system: S1S2, Tachycardic, pedal edema, no JVD.  Gastrointestinal system: Abdomen is soft, tender in the right upper quadrant from liver mets. Bowel sounds wnl.  Central nervous system: Alert and oriented, non focal.  Extremities: 2+ leg edema,  Skin: No rashes seen.  Psychiatry:  Mood is appropriate.     Data Reviewed: I have personally reviewed following labs and imaging studies  CBC: Recent Labs  Lab 02/08/20 1330 02/08/20 1425 02/09/20 0908 02/10/20 0357  WBC 4.9 4.4 3.9* 2.6*  NEUTROABS 3.2 3.1  --   --   HGB 10.1*  10.5* 9.1* 7.9*  HCT 31.3* 32.6* 28.2* 24.5*  MCV 102.0* 101.6* 102.9* 102.5*  PLT 202 212 155 142*    Basic Metabolic Panel: Recent Labs  Lab 02/08/20 1330 02/08/20 1425 02/09/20 0908 02/10/20 0357  NA 137 135 136 134*  K 3.4* 3.3* 2.9* 3.0*  CL 96* 95* 94* 96*  CO2 30 28 32 27  GLUCOSE 136* 166* 113* 118*  BUN 35* 35* 29* 29*  CREATININE 2.30*  2.36* 1.82* 1.62*  CALCIUM 9.6 9.2 8.4* 7.9*  MG  --   --  1.4* 1.9    GFR: Estimated Creatinine Clearance: 78.5 mL/min (A) (by C-G formula based on SCr of 1.62 mg/dL (H)).  Liver Function Tests: Recent Labs  Lab 02/08/20 1330 02/08/20 1425 02/09/20 0908  AST 27 33 25  ALT 28 32 22  ALKPHOS 68 76 68  BILITOT 0.9 1.0 1.1  PROT 6.1* 6.3* 5.5*  ALBUMIN 3.9 3.8 3.2*    CBG: No results for input(s): GLUCAP in the last 168 hours.   Recent Results (from the past 240 hour(s))  Culture, blood (Routine x 2)     Status: None (Preliminary result)   Collection Time: 02/08/20  2:25 PM   Specimen: Left Antecubital; Blood  Result Value Ref Range Status   Specimen Description   Final    LEFT ANTECUBITAL Performed at [facility], [address] [place], [place] [address]    Special Requests   Final    BOTTLES DRAWN AEROBIC AND ANAEROBIC Blood Culture adequate volume Performed at [facility], [address] [place], [place] [address]    Culture   Final    NO GROWTH 2 DAYS Performed at [facility], [place] [address] [place], [place] [address]    Report Status PENDING  Incomplete  Resp Panel by RT-PCR (Flu A&B, Covid) Nasopharyngeal Swab     Status: None   Collection Time: 02/08/20  2:43 PM   Specimen: Nasopharyngeal Swab; Nasopharyngeal(NP) swabs in vial transport medium  Result Value Ref Range Status   SARS Coronavirus 2 by RT PCR NEGATIVE NEGATIVE Final    Comment: (NOTE) SARS-CoV-2 target nucleic acids are NOT DETECTED.  The SARS-CoV-2 RNA is generally detectable in upper  respiratory specimens during the acute phase of infection. The lowest concentration of SARS-CoV-2 viral copies this assay can detect is 138 copies/mL. A negative result does not preclude SARS-Cov-2 infection and should not be used as the sole basis for treatment or other patient management decisions. A negative result may occur with  improper specimen collection/handling, submission of specimen other than nasopharyngeal swab, presence of viral mutation(s) within the areas targeted by this assay, and inadequate number of viral copies(<138 copies/mL). A negative result must be combined with clinical observations, patient history, and epidemiological information. The expected result is Negative.  Fact Sheet for Patients:  EntrepreneurPulse.com.au  Fact Sheet for Healthcare Providers:  IncredibleEmployment.be  This test is no t yet approved or cleared by the [place] FDA and  has been authorized for detection and/or diagnosis of SARS-CoV-2 by FDA under an Emergency Use Authorization (EUA). This EUA will remain  in effect (meaning this test can be used) for the duration of the COVID-19 declaration under Section 564(b)(1) of the Act, 21 U.S.C.section 360bbb-3(b)(1), unless the authorization is terminated  or revoked sooner.       Influenza A by PCR NEGATIVE NEGATIVE Final   Influenza B by PCR NEGATIVE NEGATIVE Final    Comment: (NOTE) The Xpert Xpress SARS-CoV-2/FLU/RSV plus assay is intended as an aid in the diagnosis of influenza from Nasopharyngeal swab specimens and should not be used as a sole basis for treatment. Nasal washings and aspirates are unacceptable for Xpert Xpress SARS-CoV-2/FLU/RSV testing.  Fact Sheet for Patients: EntrepreneurPulse.com.au  Fact Sheet for Healthcare Providers: IncredibleEmployment.be  This test is not yet approved or cleared by the [place] FDA and has been  authorized for detection and/or  diagnosis of SARS-CoV-2 by FDA under an Emergency Use Authorization (EUA). This EUA will remain in effect (meaning this test can be used) for the duration of the COVID-19 declaration under Section 564(b)(1) of the Act, 21 U.S.C. section 360bbb-3(b)(1), unless the authorization is terminated or revoked.  Performed at [facility], [address] [place], [place] [address]   Culture, blood (Routine x 2)     Status: None (Preliminary result)   Collection Time: 02/08/20  3:50 PM   Specimen: Porta Cath; Blood  Result Value Ref Range Status   Specimen Description   Final    PORTA CATH Performed at [facility], [address] [place], [place] [address]    Special Requests   Final    BOTTLES DRAWN AEROBIC AND ANAEROBIC Blood Culture adequate volume Performed at [facility], [address] [place], [place] [address]    Culture   Final    NO GROWTH 2 DAYS Performed at [facility], [place] [address], [place], [place] [address]    Report Status PENDING  Incomplete  Urine culture     Status: None   Collection Time: 02/08/20  5:10 PM   Specimen: In/Out Cath Urine  Result Value Ref Range Status   Specimen Description   Final    IN/OUT CATH URINE Performed at [facility], [address] [place], [place] [address]    Special Requests   Final    NONE Performed at [facility], [address] [place], [place] [address]    Culture   Final    NO GROWTH Performed at [facility], [place] [address], [place], [place] [address]    Report Status 02/10/2020 FINAL  Final  Urine Culture     Status: None   Collection Time: 02/09/20  8:13 AM   Specimen: Urine, Clean Catch  Result Value Ref Range Status   Specimen Description   Final    URINE, CLEAN CATCH Performed at [facility], [place] [address], [place], [place] [address]    Special Requests   Final    NONE Performed at  [facility], [place] [address], [place], [place] [address]    Culture   Final    NO GROWTH Performed at [facility], [place] [address], [place], [place] [address]    Report Status 02/10/2020 FINAL  Final  Expectorated sputum assessment w rflx to resp cult     Status: None   Collection Time: 02/10/20  1:06 PM   Specimen: Sputum  Result Value Ref Range Status   Specimen Description SPUTUM  Final   Special Requests NONE  Final   Sputum evaluation   Final    THIS SPECIMEN IS ACCEPTABLE FOR SPUTUM CULTURE Performed at [facility], [place] [address], [place],  [address]    Report Status 02/10/2020 FINAL  Final         Radiology Studies: US RENAL  Result Date: 02/09/2020 CLINICAL DATA:  Kidney stone. EXAM: RENAL / URINARY TRACT ULTRASOUND COMPLETE COMPARISON:  CT scan 02/08/2020 FINDINGS: Right Kidney: Renal measurements: 12.1 x 6.5 x 4.4 cm = volume: 179 mL. Echogenicity within normal limits. No mass or hydronephrosis visualized. Left Kidney: Renal measurements: 13.2 x 6.9 x 5.2 cm = volume: 245 mL. Echogenicity within normal limits. No mass or hydronephrosis visualized. Bladder: Appears normal for degree of bladder distention. Other: None. IMPRESSION:  1. No evidence for hydronephrosis. No suspicious abnormality identified in either kidney. Electronically Signed   By: Misty Stanley M.D.   On: 02/09/2020 08:06   ECHOCARDIOGRAM COMPLETE  Result Date: 02/09/2020    ECHOCARDIOGRAM REPORT   Patient Name:   TREVONN HALLUM Date of Exam: 02/09/2020 Medical Rec #:  [id number]    Height:       70.0 in Accession #:    [id number]   Weight:       284.4 lb Date of Birth:  [birth year]    BSA:          2.424 m Patient Age:    73 years     BP:           130/92 mmHg Patient Gender: M            HR:           116 bpm. Exam Location:  Inpatient Procedure: 2D Echo, Color Doppler and Cardiac Doppler                                 MODIFIED REPORT:   This report was  modified by Skeet Latch MD on 02/09/2020 due to no wall                               motion abnormality.  Indications:     Dyspnea R06.00  History:         Patient has prior history of Echocardiogram examinations, most                  recent 02/06/2019. Signs/Symptoms:Fever. Primary cancer of                  thyroid with metastasis to other site. Sepsis. Acute kidney                  injury.  Sonographer:     Darlina Sicilian RDCS Referring Phys:  [id number] AMY N COX Diagnosing Phys: Skeet Latch MD IMPRESSIONS  1. Mild anteroseptal hypokinesis. Left ventricular ejection fraction, by estimation, is 50 to 55%. The left ventricle has low normal function. The left ventricle has no regional wall motion abnormalities. The left ventricular internal cavity size was mildly dilated. Left ventricular diastolic parameters are consistent with Grade I diastolic dysfunction (impaired relaxation).  2. Right ventricular systolic function is normal. The right ventricular size is normal.  3. The mitral valve is normal in structure. No evidence of mitral valve regurgitation. No evidence of mitral stenosis.  4. The aortic valve is tricuspid. Aortic valve regurgitation is not visualized. No aortic stenosis is present. FINDINGS  Left Ventricle: Mild anteroseptal hypokinesis. Left ventricular ejection fraction, by estimation, is 50 to 55%. The left ventricle has low normal function. The left ventricle has no regional wall motion abnormalities. The left ventricular internal cavity size was mildly dilated. There is no left ventricular hypertrophy. Left ventricular diastolic parameters are consistent with Grade I diastolic dysfunction (impaired relaxation). Right Ventricle: The right ventricular size is normal. No increase in right ventricular wall thickness. Right ventricular systolic function is normal. Left Atrium: Left atrial size was normal in size. Right Atrium: Right atrial size was normal in size. Pericardium: There is no  evidence of pericardial effusion. Mitral Valve: The mitral valve is normal in structure. No evidence of mitral valve regurgitation. No evidence of mitral  valve stenosis. Tricuspid Valve: The tricuspid valve is normal in structure. Tricuspid valve regurgitation is not demonstrated. No evidence of tricuspid stenosis. Aortic Valve: The aortic valve is tricuspid. Aortic valve regurgitation is not visualized. No aortic stenosis is present. Pulmonic Valve: The pulmonic valve was normal in structure. Pulmonic valve regurgitation is not visualized. No evidence of pulmonic stenosis. Aorta: The aortic root is normal in size and structure. Venous: The inferior vena cava was not well visualized. IAS/Shunts: No atrial level shunt detected by color flow Doppler.  LEFT VENTRICLE PLAX 2D LVIDd:         5.80 cm LVIDs:         4.40 cm LV PW:         0.90 cm LV IVS:        1.00 cm  LV Volumes (MOD) LV vol d, MOD A2C: 133.0 ml LV vol d, MOD A4C: 124.0 ml LV vol s, MOD A2C: 63.4 ml LV vol s, MOD A4C: 42.3 ml LV SV MOD A2C:     69.6 ml LV SV MOD A4C:     124.0 ml LV SV MOD BP:      78.9 ml RIGHT VENTRICLE RV S prime:     18.20 cm/s LEFT ATRIUM           Index       RIGHT ATRIUM           Index LA diam:      3.20 cm 1.32 cm/m  RA Area:     12.00 cm LA Vol (A4C): 40.4 ml 16.67 ml/m RA Volume:   23.40 ml  9.65 ml/m  AORTIC VALVE LVOT Vmax:   126.00 cm/s LVOT Vmean:  90.500 cm/s LVOT VTI:    0.163 m MITRAL VALVE MV Area (PHT): 5.54 cm    SHUNTS MV Decel Time: 137 msec    Systemic VTI: 0.16 m MV E velocity: 77.60 cm/s MV A velocity: 98.00 cm/s MV E/A ratio:  0.79 Skeet Latch MD Electronically signed by Skeet Latch MD Signature Date/Time: 02/09/2020/9:26:21 AM    Final (Updated)    VAS [place] LOWER EXTREMITY VENOUS (DVT)  Result Date: 02/09/2020  Lower Venous DVT Study Indications: Recent Hx of DVT.  Risk Factors: DVT left pop dvt 2021. Comparison Study: Previous exam 12/2019 Performing Technologist: Vonzell Schlatter RVT   Examination Guidelines: A complete evaluation includes B-mode imaging, spectral Doppler, color Doppler, and power Doppler as needed of all accessible portions of each vessel. Bilateral testing is considered an integral part of a complete examination. Limited examinations for reoccurring indications may be performed as noted. The reflux portion of the exam is performed with the patient in reverse Trendelenburg.  +---------+---------------+---------+-----------+----------+--------------+ RIGHT    CompressibilityPhasicitySpontaneityPropertiesThrombus Aging +---------+---------------+---------+-----------+----------+--------------+ CFV      Full           Yes      Yes                                 +---------+---------------+---------+-----------+----------+--------------+ SFJ      Full                                                        +---------+---------------+---------+-----------+----------+--------------+ FV Prox  Full                                                        +---------+---------------+---------+-----------+----------+--------------+  FV Mid   Full                                                        +---------+---------------+---------+-----------+----------+--------------+ FV DistalFull                                                        +---------+---------------+---------+-----------+----------+--------------+ PFV      Full                                                        +---------+---------------+---------+-----------+----------+--------------+ POP      Full           Yes      Yes                                 +---------+---------------+---------+-----------+----------+--------------+ PTV      Full                                                        +---------+---------------+---------+-----------+----------+--------------+ PERO     Full                                                         +---------+---------------+---------+-----------+----------+--------------+   +---------+---------------+---------+-----------+----------+--------------+ LEFT     CompressibilityPhasicitySpontaneityPropertiesThrombus Aging +---------+---------------+---------+-----------+----------+--------------+ CFV      Full           Yes      Yes                                 +---------+---------------+---------+-----------+----------+--------------+ SFJ      Full                                                        +---------+---------------+---------+-----------+----------+--------------+ FV Prox  Full                                                        +---------+---------------+---------+-----------+----------+--------------+ FV Mid   Full                                                        +---------+---------------+---------+-----------+----------+--------------+  FV DistalFull                                                        +---------+---------------+---------+-----------+----------+--------------+ PFV      Full                                                        +---------+---------------+---------+-----------+----------+--------------+ POP      Full           Yes      Yes                                 +---------+---------------+---------+-----------+----------+--------------+ PTV      Full                                                        +---------+---------------+---------+-----------+----------+--------------+ PERO     Full                                                        +---------+---------------+---------+-----------+----------+--------------+     Summary: RIGHT: - There is no evidence of deep vein thrombosis in the lower extremity.  - No cystic structure found in the popliteal fossa.  LEFT: - There is no evidence of deep vein thrombosis in the lower extremity.  - No cystic structure found in the popliteal fossa.   *See table(s) above for measurements and observations. Electronically signed by Servando Snare MD on 02/09/2020 at 12:43:19 PM.    Final         Scheduled Meds: . apixaban  5 mg Oral BID  . Chlorhexidine Gluconate Cloth  6 each Topical Daily  . fluconazole  100 mg Oral Daily  . furosemide  40 mg Oral BID  . gabapentin  300 mg Oral TID  . levothyroxine  200 mcg Oral Q0600  . levothyroxine  88 mcg Oral Q0600  . methylphenidate  10 mg Oral Daily  . metroNIDAZOLE  500 mg Oral Q8H  . nystatin  5 mL Oral QID  . OLANZapine  10 mg Oral QHS  . oxyCODONE  20 mg Oral Q12H  . potassium chloride  40 mEq Oral BID  . sodium chloride flush  10-40 mL Intracatheter Q12H   Continuous Infusions: . sodium chloride Stopped (02/08/20 1941)  . ceFEPime (MAXIPIME) IV 2 g (02/10/20 1315)  . vancomycin 1,500 mg (02/09/20 2012)     LOS: 0 days       Hosie Poisson, MD [facility]   To contact the attending provider between 7A-7P or the covering provider during after hours 7P-7A, please log into the web site [email or web address] and access using universal [place] password for that web site. If you do not have the password, please call the hospital operator.  02/10/2020,  5:45 PM

## 2020-02-11 ENCOUNTER — Other Ambulatory Visit: Payer: Self-pay

## 2020-02-11 DIAGNOSIS — N179 Acute kidney failure, unspecified: Secondary | ICD-10-CM | POA: Diagnosis not present

## 2020-02-11 DIAGNOSIS — A419 Sepsis, unspecified organism: Secondary | ICD-10-CM | POA: Diagnosis not present

## 2020-02-11 DIAGNOSIS — C73 Malignant neoplasm of thyroid gland: Secondary | ICD-10-CM | POA: Diagnosis not present

## 2020-02-11 DIAGNOSIS — E039 Hypothyroidism, unspecified: Secondary | ICD-10-CM | POA: Diagnosis not present

## 2020-02-11 LAB — CBC WITH DIFFERENTIAL/PLATELET
Abs Immature Granulocytes: 0.01 10*3/uL (ref 0.00–0.07)
Basophils Absolute: 0 10*3/uL (ref 0.0–0.1)
Basophils Relative: 1 %
Eosinophils Absolute: 0.1 10*3/uL (ref 0.0–0.5)
Eosinophils Relative: 3 %
HCT: 25.4 % — ABNORMAL LOW (ref 39.0–52.0)
Hemoglobin: 8 g/dL — ABNORMAL LOW (ref 13.0–17.0)
Immature Granulocytes: 1 %
Lymphocytes Relative: 17 %
Lymphs Abs: 0.3 10*3/uL — ABNORMAL LOW (ref 0.7–4.0)
MCH: 32.3 pg (ref 26.0–34.0)
MCHC: 31.5 g/dL (ref 30.0–36.0)
MCV: 102.4 fL — ABNORMAL HIGH (ref 80.0–100.0)
Monocytes Absolute: 0.4 10*3/uL (ref 0.1–1.0)
Monocytes Relative: 21 %
Neutro Abs: 1.2 10*3/uL — ABNORMAL LOW (ref 1.7–7.7)
Neutrophils Relative %: 57 %
Platelets: 162 10*3/uL (ref 150–400)
RBC: 2.48 MIL/uL — ABNORMAL LOW (ref 4.22–5.81)
RDW: 17.8 % — ABNORMAL HIGH (ref 11.5–15.5)
WBC Morphology: INCREASED
WBC: 2 10*3/uL — ABNORMAL LOW (ref 4.0–10.5)
nRBC: 0 % (ref 0.0–0.2)

## 2020-02-11 LAB — COMPREHENSIVE METABOLIC PANEL
ALT: 19 U/L (ref 0–44)
AST: 21 U/L (ref 15–41)
Albumin: 2.8 g/dL — ABNORMAL LOW (ref 3.5–5.0)
Alkaline Phosphatase: 61 U/L (ref 38–126)
Anion gap: 11 (ref 5–15)
BUN: 26 mg/dL — ABNORMAL HIGH (ref 6–20)
CO2: 26 mmol/L (ref 22–32)
Calcium: 7.8 mg/dL — ABNORMAL LOW (ref 8.9–10.3)
Chloride: 98 mmol/L (ref 98–111)
Creatinine, Ser: 1.47 mg/dL — ABNORMAL HIGH (ref 0.61–1.24)
GFR, Estimated: 60 mL/min — ABNORMAL LOW (ref >60)
Glucose, Bld: 146 mg/dL — ABNORMAL HIGH (ref 70–99)
Potassium: 3.4 mmol/L — ABNORMAL LOW (ref 3.5–5.1)
Sodium: 135 mmol/L (ref 135–145)
Total Bilirubin: 0.7 mg/dL (ref 0.3–1.2)
Total Protein: 5.1 g/dL — ABNORMAL LOW (ref 6.5–8.1)

## 2020-02-11 LAB — MRSA PCR SCREENING: MRSA by PCR: NEGATIVE

## 2020-02-11 LAB — MAGNESIUM: Magnesium: 1.7 mg/dL (ref 1.7–2.4)

## 2020-02-11 MED ORDER — LEVOTHYROXINE SODIUM 75 MCG PO TABS
75.0000 ug | ORAL_TABLET | Freq: Every day | ORAL | Status: DC
  Administered 2020-02-12 – 2020-02-13 (×2): 75 ug via ORAL
  Filled 2020-02-11 (×2): qty 1

## 2020-02-11 MED ORDER — HYDROCORTISONE 1 % EX CREA
TOPICAL_CREAM | Freq: Two times a day (BID) | CUTANEOUS | Status: DC | PRN
  Filled 2020-02-11: qty 28

## 2020-02-11 MED ORDER — POTASSIUM CHLORIDE CRYS ER 20 MEQ PO TBCR
40.0000 meq | EXTENDED_RELEASE_TABLET | Freq: Once | ORAL | Status: AC
  Administered 2020-02-11: 40 meq via ORAL
  Filled 2020-02-11: qty 2

## 2020-02-11 NOTE — Plan of Care (Signed)
  Problem: Education: Goal: Knowledge of General Education information will improve Description: Including pain rating scale, medication(s)/side effects and non-pharmacologic comfort measures Outcome: Progressing   Problem: Health Behavior/Discharge Planning: Goal: Ability to manage health-related needs will improve Outcome: Progressing   Problem: Clinical Measurements: Goal: Respiratory complications will improve Outcome: Progressing   Problem: Activity: Goal: Risk for activity intolerance will decrease Outcome: Progressing   Problem: Nutrition: Goal: Adequate nutrition will be maintained Outcome: Progressing   Problem: Coping: Goal: Level of anxiety will decrease Outcome: Progressing   Problem: Elimination: Goal: Will not experience complications related to bowel motility Outcome: Progressing Goal: Will not experience complications related to urinary retention Outcome: Progressing   Problem: Pain Managment: Goal: General experience of comfort will improve Outcome: Progressing   Problem: Safety: Goal: Ability to remain free from injury will improve Outcome: Progressing

## 2020-02-11 NOTE — Progress Notes (Signed)
   02/10/20 2139  Assess: MEWS Score  Temp (!) 101 F (38.3 C)  BP 126/84  Pulse Rate (!) 101  SpO2 100 %  O2 Device Room Air  Assess: MEWS Score  MEWS Temp 1  MEWS Systolic 0  MEWS Pulse 1  MEWS RR 0  MEWS LOC 0  MEWS Score 2  MEWS Score Color Yellow  Assess: if the MEWS score is Yellow or Red  Were vital signs taken at a resting state? Yes  Focused Assessment No change from prior assessment  Early Detection of Sepsis Score *See Row Information* Medium  MEWS guidelines implemented *See Row Information* No, previously yellow, continue vital signs every 4 hours  Treat  MEWS Interventions Administered prn meds/treatments  Notify: Charge Nurse/RN  Name of Charge Nurse/RN Notified Raynelle Jan, RN  Date Charge Nurse/RN Notified 02/10/20  Time Charge Nurse/RN Notified 2330  Notified Camera operator. Will continue to take vitals every 4 hours due to previously being yellow. Gave PRN med for pain/elevated temperature.

## 2020-02-11 NOTE — Progress Notes (Signed)
PROGRESS NOTE    Brian [name]  UMP:[id number] DOB: 10/17/1975 DOA: 02/08/2020 PCP: Sharyne Richters, MD    Chief Complaint  Patient presents with  . Shortness of Breath  . Fever    Brief Narrative:  44 year old gentleman with prior history of metastatic thyroid cancer with mets were s/p chemotherapy, multiple pulmonary embolism history of DVT in the left popliteal vein, hypertension, obesity, anxiety presents to ED with abdominal pain associated with some nausea.  On arrival he was found to be febrile tachycardic, AKI and he was admitted for sepsis of unclear etiology. Pt seen and examined at bedside. Fever resolved.   Assessment & Plan:   Active Problems:   Low back pain   Primary cancer of thyroid with metastasis to other site [facility])   Essential hypertension   Sepsis ([place])   Obesity, Class III, BMI 40-49.9 (morbid obesity) (HCC)   Hypothyroidism   AKI (acute kidney injury) ([place])  Sepsis of unclear etiology./ Febrile Neutropenia.  Patient was started on broad-spectrum IV antibiotics and cultures are pending at this time.  Urine analysis is negative, chest x-ray is negative for pneumonia, blood cultures and urine cultures are pending at this time, negative so far.  CT of the abdomen and pelvis with contrast is negative for any acute intra abdominal pathology. Last fever at 9 pm last night. . Pt reports oral candidiasis, diflucan added.  He has productive sputum, sputum cultures ordered.  Possible d/c IV antibiotics in am if all cultures are negative.   Abdominal pain.  Probably secondary to liver mets Pain control with oral oxycodone and IV Dilaudid. No new complaints today.    AKI probably secondary to prerenal causes, Ultrasound of the kidneys negative for any hydronephrosis. Hold home losartan and nephrotoxins. Renal parameters improving.  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  Further management  as per Dr Marin Olp.    Pancytopenia:  Probably secondary to chemo.  transfuse to keep hemoglobin greater than 7.  Hemoglobin stable around 8, and platelets at 162,000 but absolute neutrophil count at 1200.  Continue to monitor.    Hypokalemia and hypomagnesemia Replaced.  Hypothyroidism Decrease the dose of synthroid from 288 to 275 mcg daily.  tsh is 0.094 and free t4 is 2.4 slightly high.    Anxiety Continue with Ativan.  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   Tachycardia:  Pt denies any chest pain. Echocardiogram showed Mild anteroseptal hypokinesis. Left ventricular ejection fraction, by  estimation, is 50 to 55%. The left ventricle has low normal function. The  left ventricle has no regional wall motion abnormalities.  Left ventricular diastolic parameters are consistent with  Grade I diastolic dysfunction (impaired relaxation).  Get EKG .  Tachycardia probably from hyperthyroid states.    Chronic diastolic heart failure:  Resume Lasix 40 mg BID.  PT has persistent blateral pedal edema.  But he appears compensated.  Recommend outpatient follow up with cardiology.    DVT prophylaxis: (Eliquis) Code Status: FULL CODE.  Family Communication: none at bedside.  Disposition:   Status is: Inpatient.   The patient will require care spanning > 2 midnights and should be moved to inpatient because: Ongoing diagnostic testing needed not appropriate for outpatient work up, IV treatments appropriate due to intensity of illness or inability to take PO and Inpatient level of care appropriate due to severity of illness  Dispo: The  patient is from: Home              Anticipated d/c is to: Home              Anticipated d/c date is: 1 day              Patient currently is not medically stable to d/c.       Consultants:   Oncology.    Procedures: none.    Antimicrobials:  Antibiotics Given (last 72 hours)    Date/Time Action  Medication Dose Rate   02/08/20 1604 New Bag/Given   metroNIDAZOLE (FLAGYL) IVPB 500 mg 500 mg 100 mL/hr   02/08/20 1712 New Bag/Given   ceFEPIme (MAXIPIME) 2 g in sodium chloride 0.9 % 100 mL IVPB 2 g 200 mL/hr   02/08/20 1817 New Bag/Given   vancomycin (VANCOCIN) IVPB 1000 mg/200 mL premix 1,000 mg 200 mL/hr   02/08/20 2037 New Bag/Given   vancomycin (VANCOCIN) IVPB 1000 mg/200 mL premix 1,000 mg 200 mL/hr   02/09/20 0036 Given   metroNIDAZOLE (FLAGYL) tablet 500 mg 500 mg    02/09/20 0622 Given   metroNIDAZOLE (FLAGYL) tablet 500 mg 500 mg    02/09/20 7989 New Bag/Given   ceFEPIme (MAXIPIME) 2 g in sodium chloride 0.9 % 100 mL IVPB 2 g 200 mL/hr   02/09/20 1404 Given   metroNIDAZOLE (FLAGYL) tablet 500 mg 500 mg    02/09/20 1518 New Bag/Given   ceFEPIme (MAXIPIME) 2 g in sodium chloride 0.9 % 100 mL IVPB 2 g 200 mL/hr   02/09/20 2012 New Bag/Given   vancomycin (VANCOREADY) IVPB 1500 mg/300 mL 1,500 mg 150 mL/hr   02/09/20 2246 Given   metroNIDAZOLE (FLAGYL) tablet 500 mg 500 mg    02/09/20 2254 New Bag/Given   ceFEPIme (MAXIPIME) 2 g in sodium chloride 0.9 % 100 mL IVPB 2 g 200 mL/hr   02/10/20 0533 Given   metroNIDAZOLE (FLAGYL) tablet 500 mg 500 mg    02/10/20 0536 New Bag/Given   ceFEPIme (MAXIPIME) 2 g in sodium chloride 0.9 % 100 mL IVPB 2 g 200 mL/hr   02/10/20 1312 Given   metroNIDAZOLE (FLAGYL) tablet 500 mg 500 mg    02/10/20 1315 New Bag/Given   ceFEPIme (MAXIPIME) 2 g in sodium chloride 0.9 % 100 mL IVPB 2 g 200 mL/hr   02/10/20 1830 New Bag/Given   vancomycin (VANCOREADY) IVPB 1500 mg/300 mL 1,500 mg 150 mL/hr   02/10/20 2123 New Bag/Given   ceFEPIme (MAXIPIME) 2 g in sodium chloride 0.9 % 100 mL IVPB 2 g 200 mL/hr   02/10/20 2125 Given   metroNIDAZOLE (FLAGYL) tablet 500 mg 500 mg    02/11/20 0551 Given   metroNIDAZOLE (FLAGYL) tablet 500 mg 500 mg    02/11/20 0608 New Bag/Given   ceFEPIme (MAXIPIME) 2 g in sodium chloride 0.9 % 100 mL IVPB 2 g 200 mL/hr          Subjective: Wants to know when he can go home. No chest ain or sob.  Objective: Vitals:   02/10/20 2139 02/11/20 0000 02/11/20 0221 02/11/20 0511  BP: 126/84  132/83 137/81  Pulse: (!) 101  (!) 115 (!) 107  Resp:   20 17  Temp: (!) 101 F (38.3 C) 99.5 F (37.5 C) 98 F (36.7 C) 98.5 F (36.9 C)  TempSrc:  Oral  Oral  SpO2: 100%  93% 92%  Weight:      Height:  Intake/Output Summary (Last 24 hours) at 02/11/2020 1054 Last data filed at 02/11/2020 0955 Gross per 24 hour  Intake 1460 ml  Output 800 ml  Net 660 ml   Filed Weights   02/08/20 1412  Weight: 129 kg    Examination:  General exam:  Alert , comfortable, not in distress.  Respiratory system: air entry fair, no wheezing heard, no tachypnea.  Cardiovascular system: S1S2 Tachycardic,  Gastrointestinal system: Abdomen is  Soft, tender in the RUQ, bowel sounds wnl.  Central nervous system: Alert and oriented,  Extremities: 2+ leg edema,  Skin: No rashes seen.  Psychiatry:  Mood is appropriate. .     Data Reviewed: I have personally reviewed following labs and imaging studies  CBC: Recent Labs  Lab 02/08/20 1330 02/08/20 1425 02/09/20 0908 02/10/20 0357 02/11/20 0330  WBC 4.9 4.4 3.9* 2.6* 2.0*  NEUTROABS 3.2 3.1  --   --  1.2*  HGB 10.1* 10.5* 9.1* 7.9* 8.0*  HCT 31.3* 32.6* 28.2* 24.5* 25.4*  MCV 102.0* 101.6* 102.9* 102.5* 102.4*  PLT 202 212 155 142* 614    Basic Metabolic Panel: Recent Labs  Lab 02/08/20 1330 02/08/20 1425 02/09/20 0908 02/10/20 0357 02/11/20 0335  NA 137 135 136 134* 135  K 3.4* 3.3* 2.9* 3.0* 3.4*  CL 96* 95* 94* 96* 98  CO2 30 28 32 27 26  GLUCOSE 136* 166* 113* 118* 146*  BUN 35* 35* 29* 29* 26*  CREATININE 2.30* 2.36* 1.82* 1.62* 1.47*  CALCIUM 9.6 9.2 8.4* 7.9* 7.8*  MG  --   --  1.4* 1.9 1.7    GFR: Estimated Creatinine Clearance: 86.5 mL/min (A) (by C-G formula based on SCr of 1.47 mg/dL (H)).  Liver Function Tests: Recent Labs  Lab  02/08/20 1330 02/08/20 1425 02/09/20 0908 02/11/20 0335  AST 27 33 25 21  ALT 28 32 22 19  ALKPHOS 68 76 68 61  BILITOT 0.9 1.0 1.1 0.7  PROT 6.1* 6.3* 5.5* 5.1*  ALBUMIN 3.9 3.8 3.2* 2.8*    CBG: No results for input(s): GLUCAP in the last 168 hours.   Recent Results (from the past 240 hour(s))  Culture, blood (Routine x 2)     Status: None (Preliminary result)   Collection Time: 02/08/20  2:25 PM   Specimen: Left Antecubital; Blood  Result Value Ref Range Status   Specimen Description   Final    LEFT ANTECUBITAL Performed at [facility], [address] [place], [place] [address]    Special Requests   Final    BOTTLES DRAWN AEROBIC AND ANAEROBIC Blood Culture adequate volume Performed at [facility], [address] [place], [place] [address]    Culture   Final    NO GROWTH 2 DAYS Performed at [facility], [place] [address] [place], [place] [address]    Report Status PENDING  Incomplete  Resp Panel by RT-PCR (Flu A&B, Covid) Nasopharyngeal Swab     Status: None   Collection Time: 02/08/20  2:43 PM   Specimen: Nasopharyngeal Swab; Nasopharyngeal(NP) swabs in vial transport medium  Result Value Ref Range Status   SARS Coronavirus 2 by RT PCR NEGATIVE NEGATIVE Final    Comment: (NOTE) SARS-CoV-2 target nucleic acids are NOT DETECTED.  The SARS-CoV-2 RNA is generally detectable in upper respiratory specimens during the acute phase of infection. The lowest concentration of SARS-CoV-2 viral copies this assay can detect is 138 copies/mL. A negative result does not preclude  SARS-Cov-2 infection and should not be used as the sole basis for treatment or other patient management decisions. A negative result may occur with  improper specimen collection/handling, submission of specimen other than nasopharyngeal swab, presence of viral mutation(s) within the areas targeted by this assay, and inadequate number of viral copies(<138 copies/mL). A  negative result must be combined with clinical observations, patient history, and epidemiological information. The expected result is Negative.  Fact Sheet for Patients:  EntrepreneurPulse.com.au  Fact Sheet for Healthcare Providers:  IncredibleEmployment.be  This test is no t yet approved or cleared by the [place] FDA and  has been authorized for detection and/or diagnosis of SARS-CoV-2 by FDA under an Emergency Use Authorization (EUA). This EUA will remain  in effect (meaning this test can be used) for the duration of the COVID-19 declaration under Section 564(b)(1) of the Act, 21 U.S.C.section 360bbb-3(b)(1), unless the authorization is terminated  or revoked sooner.       Influenza A by PCR NEGATIVE NEGATIVE Final   Influenza B by PCR NEGATIVE NEGATIVE Final    Comment: (NOTE) The Xpert Xpress SARS-CoV-2/FLU/RSV plus assay is intended as an aid in the diagnosis of influenza from Nasopharyngeal swab specimens and should not be used as a sole basis for treatment. Nasal washings and aspirates are unacceptable for Xpert Xpress SARS-CoV-2/FLU/RSV testing.  Fact Sheet for Patients: EntrepreneurPulse.com.au  Fact Sheet for Healthcare Providers: IncredibleEmployment.be  This test is not yet approved or cleared by the [place] FDA and has been authorized for detection and/or diagnosis of SARS-CoV-2 by FDA under an Emergency Use Authorization (EUA). This EUA will remain in effect (meaning this test can be used) for the duration of the COVID-19 declaration under Section 564(b)(1) of the Act, 21 U.S.C. section 360bbb-3(b)(1), unless the authorization is terminated or revoked.  Performed at [facility], [address] [place], [place] [address]   Culture, blood (Routine x 2)     Status: None (Preliminary result)   Collection Time: 02/08/20  3:50 PM   Specimen: Porta Cath; Blood   Result Value Ref Range Status   Specimen Description   Final    PORTA CATH Performed at [facility], [address] [place], [place] [address]    Special Requests   Final    BOTTLES DRAWN AEROBIC AND ANAEROBIC Blood Culture adequate volume Performed at [facility], [address] [place], [place] [address]    Culture   Final    NO GROWTH 2 DAYS Performed at [facility], [place] [address], [place], [place] [address]    Report Status PENDING  Incomplete  Urine culture     Status: None   Collection Time: 02/08/20  5:10 PM   Specimen: In/Out Cath Urine  Result Value Ref Range Status   Specimen Description   Final    IN/OUT CATH URINE Performed at [facility], [address] [place], [place] [address]    Special Requests   Final    NONE Performed at [facility], [address] [place], [place] [address]    Culture   Final    NO GROWTH Performed at [facility], [place] [address] [place], [place] [address]    Report Status 02/10/2020 FINAL  Final  Urine Culture     Status: None   Collection Time: 02/09/20  8:13 AM   Specimen: Urine, Clean Catch  Result Value Ref Range  Status   Specimen Description   Final    URINE, CLEAN CATCH Performed at [facility], [place] [address] [place], [place] [address]    Special Requests   Final    NONE Performed at [facility], [place] [address], [place], [place] [address]    Culture   Final    NO GROWTH Performed at [facility], [place] [address], [place], [place] [address]    Report Status 02/10/2020 FINAL  Final  Expectorated sputum assessment w rflx to resp cult     Status: None   Collection Time: 02/10/20  1:06 PM   Specimen: Sputum  Result Value Ref Range Status   Specimen Description SPUTUM  Final   Special Requests NONE  Final   Sputum evaluation   Final    THIS SPECIMEN IS ACCEPTABLE FOR SPUTUM CULTURE Performed at [facility], [place] [address], [place], [place] [address]    Report Status 02/10/2020 FINAL  Final  Culture, respiratory     Status: None (Preliminary result)   Collection Time: 02/10/20  1:06 PM   Specimen: SPU  Result Value Ref Range Status   Specimen Description   Final    SPUTUM Performed at [place] [address] [place], [place] [address]    Special Requests   Final    NONE Reflexed from [phone] Performed at [facility], [place] [address], [place], [place] [address]    Gram Stain   Final    FEW WBC PRESENT,BOTH PMN AND MONONUCLEAR NO ORGANISMS SEEN    Culture   Final    NO GROWTH < 24 HOURS Performed at [facility], [place] [address] [place],  [address]    Report Status PENDING  Incomplete         Radiology Studies: No results found.      Scheduled Meds: . apixaban  5 mg Oral BID  . Chlorhexidine Gluconate Cloth  6 each Topical Daily  . fluconazole  100 mg Oral Daily  . furosemide  40 mg Oral BID  . gabapentin  300 mg Oral TID  . levothyroxine  200 mcg Oral Q0600  . levothyroxine  88 mcg Oral Q0600  . methylphenidate  10 mg Oral Daily  . metroNIDAZOLE  500 mg Oral Q8H  . nystatin  5 mL Oral QID  . OLANZapine  10 mg Oral QHS  . oxyCODONE  20 mg Oral Q12H  . sodium chloride flush  10-40 mL Intracatheter Q12H   Continuous Infusions: . sodium chloride 250 mL (02/11/20 0604)  . ceFEPime (MAXIPIME) IV 2 g (02/11/20 8938)  . vancomycin 1,500 mg (02/10/20 1830)     LOS: 1 day       Hosie Poisson, MD [facility]   To contact the attending provider between 7A-7P or the covering provider during after hours 7P-7A, please log into the web site [email or web address] and access using universal [place] password for that web site. If you do not have the password, please call the hospital operator.  02/11/2020, 10:54 AM

## 2020-02-12 DIAGNOSIS — T451X5A Adverse effect of antineoplastic and immunosuppressive drugs, initial encounter: Secondary | ICD-10-CM

## 2020-02-12 DIAGNOSIS — C787 Secondary malignant neoplasm of liver and intrahepatic bile duct: Secondary | ICD-10-CM

## 2020-02-12 DIAGNOSIS — E039 Hypothyroidism, unspecified: Secondary | ICD-10-CM | POA: Diagnosis not present

## 2020-02-12 DIAGNOSIS — D6481 Anemia due to antineoplastic chemotherapy: Secondary | ICD-10-CM

## 2020-02-12 DIAGNOSIS — R531 Weakness: Secondary | ICD-10-CM | POA: Diagnosis not present

## 2020-02-12 DIAGNOSIS — R5383 Other fatigue: Secondary | ICD-10-CM | POA: Diagnosis not present

## 2020-02-12 DIAGNOSIS — I82421 Acute embolism and thrombosis of right iliac vein: Secondary | ICD-10-CM

## 2020-02-12 DIAGNOSIS — D701 Agranulocytosis secondary to cancer chemotherapy: Secondary | ICD-10-CM

## 2020-02-12 DIAGNOSIS — C73 Malignant neoplasm of thyroid gland: Secondary | ICD-10-CM | POA: Diagnosis not present

## 2020-02-12 DIAGNOSIS — A419 Sepsis, unspecified organism: Secondary | ICD-10-CM | POA: Diagnosis not present

## 2020-02-12 DIAGNOSIS — Z79899 Other long term (current) drug therapy: Secondary | ICD-10-CM

## 2020-02-12 DIAGNOSIS — R509 Fever, unspecified: Secondary | ICD-10-CM

## 2020-02-12 DIAGNOSIS — Z7901 Long term (current) use of anticoagulants: Secondary | ICD-10-CM

## 2020-02-12 DIAGNOSIS — R0602 Shortness of breath: Secondary | ICD-10-CM

## 2020-02-12 DIAGNOSIS — N179 Acute kidney failure, unspecified: Secondary | ICD-10-CM | POA: Diagnosis not present

## 2020-02-12 LAB — COMPREHENSIVE METABOLIC PANEL
ALT: 24 U/L (ref 0–44)
AST: 39 U/L (ref 15–41)
Albumin: 2.7 g/dL — ABNORMAL LOW (ref 3.5–5.0)
Alkaline Phosphatase: 56 U/L (ref 38–126)
Anion gap: 10 (ref 5–15)
BUN: 20 mg/dL (ref 6–20)
CO2: 26 mmol/L (ref 22–32)
Calcium: 7.8 mg/dL — ABNORMAL LOW (ref 8.9–10.3)
Chloride: 101 mmol/L (ref 98–111)
Creatinine, Ser: 1.26 mg/dL — ABNORMAL HIGH (ref 0.61–1.24)
GFR, Estimated: >60 mL/min (ref >60)
Glucose, Bld: 120 mg/dL — ABNORMAL HIGH (ref 70–99)
Potassium: 3.1 mmol/L — ABNORMAL LOW (ref 3.5–5.1)
Sodium: 137 mmol/L (ref 135–145)
Total Bilirubin: 0.4 mg/dL (ref 0.3–1.2)
Total Protein: 5 g/dL — ABNORMAL LOW (ref 6.5–8.1)

## 2020-02-12 LAB — CBC WITH DIFFERENTIAL/PLATELET
Abs Immature Granulocytes: 0 10*3/uL (ref 0.00–0.07)
Basophils Absolute: 0 10*3/uL (ref 0.0–0.1)
Basophils Relative: 1 %
Eosinophils Absolute: 0.1 10*3/uL (ref 0.0–0.5)
Eosinophils Relative: 4 %
HCT: 25 % — ABNORMAL LOW (ref 39.0–52.0)
Hemoglobin: 7.7 g/dL — ABNORMAL LOW (ref 13.0–17.0)
Lymphocytes Relative: 33 %
Lymphs Abs: 0.5 10*3/uL — ABNORMAL LOW (ref 0.7–4.0)
MCH: 31.8 pg (ref 26.0–34.0)
MCHC: 30.8 g/dL (ref 30.0–36.0)
MCV: 103.3 fL — ABNORMAL HIGH (ref 80.0–100.0)
Monocytes Absolute: 0.2 10*3/uL (ref 0.1–1.0)
Monocytes Relative: 14 %
Neutro Abs: 0.7 10*3/uL — ABNORMAL LOW (ref 1.7–7.7)
Neutrophils Relative %: 48 %
Platelets: 183 10*3/uL (ref 150–400)
RBC: 2.42 MIL/uL — ABNORMAL LOW (ref 4.22–5.81)
RDW: 17.9 % — ABNORMAL HIGH (ref 11.5–15.5)
WBC: 1.5 10*3/uL — ABNORMAL LOW (ref 4.0–10.5)
nRBC: 0 % (ref 0.0–0.2)

## 2020-02-12 LAB — CULTURE, RESPIRATORY W GRAM STAIN: Culture: NORMAL

## 2020-02-12 LAB — PREPARE RBC (CROSSMATCH)

## 2020-02-12 LAB — ABO/RH: ABO/RH(D): O NEG

## 2020-02-12 MED ORDER — CHLORHEXIDINE GLUCONATE 0.12 % MT SOLN
15.0000 mL | Freq: Four times a day (QID) | OROMUCOSAL | Status: DC
  Administered 2020-02-12 – 2020-02-13 (×4): 15 mL via OROMUCOSAL
  Filled 2020-02-12 (×5): qty 15

## 2020-02-12 MED ORDER — METOLAZONE 5 MG PO TABS
5.0000 mg | ORAL_TABLET | Freq: Once | ORAL | Status: AC
  Administered 2020-02-12: 5 mg via ORAL
  Filled 2020-02-12: qty 1

## 2020-02-12 MED ORDER — FUROSEMIDE 10 MG/ML IJ SOLN
40.0000 mg | Freq: Once | INTRAMUSCULAR | Status: AC
  Administered 2020-02-12: 40 mg via INTRAVENOUS
  Filled 2020-02-12: qty 4

## 2020-02-12 MED ORDER — TBO-FILGRASTIM 480 MCG/0.8ML ~~LOC~~ SOSY
480.0000 ug | PREFILLED_SYRINGE | Freq: Every day | SUBCUTANEOUS | Status: DC
  Administered 2020-02-12: 480 ug via SUBCUTANEOUS
  Filled 2020-02-12: qty 0.8

## 2020-02-12 MED ORDER — FUROSEMIDE 10 MG/ML IJ SOLN: 40.0000 mg | Freq: Once | INTRAMUSCULAR | Status: DC

## 2020-02-12 MED ORDER — POTASSIUM CHLORIDE CRYS ER 20 MEQ PO TBCR
40.0000 meq | EXTENDED_RELEASE_TABLET | Freq: Once | ORAL | Status: AC
  Administered 2020-02-12: 40 meq via ORAL
  Filled 2020-02-12: qty 2

## 2020-02-12 MED ORDER — SODIUM CHLORIDE 0.9% IV SOLUTION: Freq: Once | INTRAVENOUS | Status: AC

## 2020-02-12 NOTE — Progress Notes (Signed)
Pharmacy Antibiotic Note  Brian [name] is a 44 y.o. male with metastatic thyroid cancer in chemotherapy treatment PTA presented to the ED on 02/08/2020 with abdominal pain and fever.  He's currently on cefepime for broad empiric coverage for sepsis/febrile neutropenia.  Today, 02/12/2020: - Day #4 cefepime - afeb, wbc low, ANC 0.7 - scr trending down (crcl 100) - all cultures have been negative thus far - granix ordered to start on 12/27  Plan: - cefepime 2gm IV q8h  ________________________________________  Height: 5\' 10"  (177.8 cm) Weight: 129 kg (284 lb 6.3 oz) IBW/kg (Calculated) : 73  Temp (24hrs), Avg:98.3 F (36.8 C), Min:98 F (36.7 C), Max:98.7 F (37.1 C)  Recent Labs  Lab 02/08/20 1425 02/08/20 1439 02/09/20 0908 02/10/20 0357 02/11/20 0330 02/11/20 0335 02/12/20 0309  WBC 4.4  --  3.9* 2.6* 2.0*  --  1.5*  CREATININE 2.36*  --  1.82* 1.62*  --  1.47* 1.26*  LATICACIDVEN 1.6 1.1  --   --   --   --   --     Estimated Creatinine Clearance: 101 mL/min (A) (by C-G formula based on SCr of 1.26 mg/dL (H)).    Allergies  Allergen Reactions  . Dextromethorphan-Guaifenesin Other (See Comments)    Irregular heartbeat   . Doxycycline Anaphylaxis, Nausea And Vomiting, Rash and Other (See Comments)    "heart arrythmia" and "dyspepsia" (only oral doxycycline causes reaction)  . Gadobutrol Hives and Other (See Comments)    Patient had MRI scan at [place]. Patient called one hour after he left imaging facility to report two "blisters" that came up on "back" of lip.    . Gadolinium Derivatives Hives    Patient had MRI scan at [place]. Patient called one hour after he left imaging facility to report two "blisters" that came up on "back" of lip.   . Guaifenesin Palpitations       . Ibuprofen Hives and Itching  . Lisinopril Other (See Comments)    Angioedema  . Pantoprazole Itching  . Tramadol Hives and Itching  . Barium Rash    Developed  redness around neck after drinking 1st bottle of Barocat; pt was given Benedryl by ED Mds to be "on the safe side" before drinking 2nd bottle  . Chlorhexidine Hives   Antimicrobials this admission:  12/23 Vancomycin >> 12/26 12/23 Cefepime >> 12/23 Metronidazole >> 12/26  PTA fluconazole resumed (chronic med per [place])   Microbiology results:  12/23 BCx: NGTD 12/23 Resp panel: negative 12/23 UCx: NGF 12/24 UCx: NGF 12/25 sputum: NGTD 12/26 MRSA PCR: neg   Thank you for allowing pharmacy to be a part of this patient's care.  Lynelle Doctor 02/12/2020 8:51 AM

## 2020-02-12 NOTE — Plan of Care (Signed)
  Problem: Clinical Measurements: Goal: Respiratory complications will improve Outcome: Progressing   Problem: Activity: Goal: Risk for activity intolerance will decrease Outcome: Progressing   Problem: Nutrition: Goal: Adequate nutrition will be maintained Outcome: Progressing   Problem: Elimination: Goal: Will not experience complications related to bowel motility Outcome: Progressing Goal: Will not experience complications related to urinary retention Outcome: Progressing   Problem: Safety: Goal: Ability to remain free from injury will improve Outcome: Progressing   Problem: Skin Integrity: Goal: Risk for impaired skin integrity will decrease Outcome: Progressing

## 2020-02-12 NOTE — Progress Notes (Signed)
Pt complained of feeling a weird sensation in LLE after going to the bathroom and coming back to the bed. Patient's LLE was red, swollen, hot, and tender to the touch. Patient also complained of a shooting pain from left ankle to thigh and felt like something was moving. On call provider was notified. Nurse told on call provider that pt had already had pain medication. On call provider told nurse to let the patient know to give medication about an hour to work; and if patient was still in pain, to notify on call provider. Nurse checked back on patient, and patient told nurse he was okay.

## 2020-02-12 NOTE — Progress Notes (Signed)
PROGRESS NOTE    Brian [name]  YKZ:[id number] DOB: January 30, 1976 DOA: 02/08/2020 PCP: Sharyne Richters, MD    Chief Complaint  Patient presents with  . Shortness of Breath  . Fever    Brief Narrative:  44 year old gentleman with prior history of metastatic thyroid cancer with mets were s/p chemotherapy, multiple pulmonary embolism history of DVT in the left popliteal vein, hypertension, obesity, anxiety presents to ED with abdominal pain associated with some nausea.  On arrival he was found to be febrile tachycardic, AKI and he was admitted for sepsis of unclear etiology. . Fever resolved.  Completed 4 days of IV cefepime.  Dr. Marin Olp recommended 2 units of PRBC transfusion followed by Lasix.  Assessment & Plan:   Active Problems:   Low back pain   Primary cancer of thyroid with metastasis to other site [facility])   Essential hypertension   Sepsis ([place])   Obesity, Class III, BMI 40-49.9 (morbid obesity) (HCC)   Hypothyroidism   AKI (acute kidney injury) ([place])  Sepsis of unclear etiology./ Febrile Neutropenia.  Patient was started on broad-spectrum IV antibiotics and cultures are pending at this time.  Urine analysis is negative, chest x-ray is negative for pneumonia, blood cultures and urine cultures are pending at this time, negative so far.  CT of the abdomen and pelvis with contrast is negative for any acute intra abdominal pathology. Last fever at 9 pm last night. . Pt reports oral candidiasis, diflucan added.  He has productive sputum, sputum cultures ordered.  DC IV cefepime tomorrow if all cultures are negative at this time.  Abdominal pain.  Probably secondary to liver mets Pain control with oral oxycodone and IV Dilaudid.    AKI probably secondary to prerenal causes, Ultrasound of the kidneys negative for any hydronephrosis. Hold home losartan and nephrotoxins. Renal parameters improving.  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  Further management as per Dr Marin Olp.    Pancytopenia:  Probably secondary to chemotherapy been around 7.7 this morning and Dr. Marin Olp with oncology recommended and ordered 2 units of PRBC transfusion. Absolute neutrophil count decreased to 700 today, platelets have improved Continue to monitor.    Hypokalemia and hypomagnesemia Replaced.  Hypothyroidism Decrease the dose of synthroid from 288 to 275 mcg daily.  tsh is 0.094 and free t4 is 2.4 slightly high.    Anxiety Continue with Ativan.  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   Tachycardia:  Pt denies any chest pain. Echocardiogram showed Mild anteroseptal hypokinesis. Left ventricular ejection fraction, by  estimation, is 50 to 55%. The left ventricle has low normal function. The  left ventricle has no regional wall motion abnormalities.  Left ventricular diastolic parameters are consistent with  Grade I diastolic dysfunction (impaired relaxation).  Get EKG .  Tachycardia probably from hyperthyroid states.    Chronic diastolic heart failure:  Resume Lasix 40 mg BID.  Changed to IV today as patient is getting 2 units of PRBC transfusion, metolazone added today PT has persistent blateral pedal edema.  But he appears compensated.  Recommend outpatient follow up with cardiology.    DVT prophylaxis: (Eliquis) Code Status: FULL CODE.  Family Communication: none at bedside.  Discussed the plan with the patient Disposition:   Status is: Inpatient.   The patient will require care spanning > 2 midnights and should be moved to inpatient  because: Ongoing diagnostic testing needed not appropriate for outpatient work up, IV treatments appropriate due to intensity of illness or inability to take PO and Inpatient level of care appropriate due to severity of illness  Dispo: The patient is from: Home              Anticipated d/c is to: Home               Anticipated d/c date is: 1 day              Patient currently is not medically stable to d/c.       Consultants:   Oncology.    Procedures: none.    Antimicrobials:  Antibiotics Given (last 72 hours)    Date/Time Action Medication Dose Rate   02/09/20 2012 New Bag/Given   vancomycin (VANCOREADY) IVPB 1500 mg/300 mL 1,500 mg 150 mL/hr   02/09/20 2246 Given   metroNIDAZOLE (FLAGYL) tablet 500 mg 500 mg    02/09/20 2254 New Bag/Given   ceFEPIme (MAXIPIME) 2 g in sodium chloride 0.9 % 100 mL IVPB 2 g 200 mL/hr   02/10/20 0533 Given   metroNIDAZOLE (FLAGYL) tablet 500 mg 500 mg    02/10/20 0536 New Bag/Given   ceFEPIme (MAXIPIME) 2 g in sodium chloride 0.9 % 100 mL IVPB 2 g 200 mL/hr   02/10/20 1312 Given   metroNIDAZOLE (FLAGYL) tablet 500 mg 500 mg    02/10/20 1315 New Bag/Given   ceFEPIme (MAXIPIME) 2 g in sodium chloride 0.9 % 100 mL IVPB 2 g 200 mL/hr   02/10/20 1830 New Bag/Given   vancomycin (VANCOREADY) IVPB 1500 mg/300 mL 1,500 mg 150 mL/hr   02/10/20 2123 New Bag/Given   ceFEPIme (MAXIPIME) 2 g in sodium chloride 0.9 % 100 mL IVPB 2 g 200 mL/hr   02/10/20 2125 Given   metroNIDAZOLE (FLAGYL) tablet 500 mg 500 mg    02/11/20 0551 Given   metroNIDAZOLE (FLAGYL) tablet 500 mg 500 mg    02/11/20 4431 New Bag/Given   ceFEPIme (MAXIPIME) 2 g in sodium chloride 0.9 % 100 mL IVPB 2 g 200 mL/hr   02/11/20 1342 New Bag/Given   ceFEPIme (MAXIPIME) 2 g in sodium chloride 0.9 % 100 mL IVPB 2 g 200 mL/hr   02/11/20 2100 New Bag/Given   ceFEPIme (MAXIPIME) 2 g in sodium chloride 0.9 % 100 mL IVPB 2 g 200 mL/hr   02/12/20 0500 New Bag/Given   ceFEPIme (MAXIPIME) 2 g in sodium chloride 0.9 % 100 mL IVPB 2 g 200 mL/hr         Subjective: Patient denies any new complaints today Objective: Vitals:   02/12/20 0227 02/12/20 0454 02/12/20 1312 02/12/20 1336  BP: (!) 141/84 125/78 (!) 132/94 (!) 130/91  Pulse: (!) 103 (!) 102 (!) 113 (!) 116  Resp: 19 20 20 16   Temp:  98.1 F (36.7 C) 98 F (36.7 C) 98.9 F (37.2 C) 99.8 F (37.7 C)  TempSrc: Oral  Oral Oral  SpO2: 95% 97% 99% 95%  Weight:      Height:        Intake/Output Summary (Last 24 hours) at 02/12/2020 1603 Last data filed at 02/12/2020 1400 Gross per 24 hour  Intake 1728.91 ml  Output 1725 ml  Net 3.91 ml   Filed Weights   02/08/20 1412  Weight: 129 kg    Examination:  General exam: Well-developed gentleman not in any kind of distress Respiratory system: Air entry fair bilateral, no wheezing  or rhonchi Cardiovascular system: S1-S2 heard, tachycardic, 2+ leg edema present Gastrointestinal system: Abdomen is soft, mildly tender in the right upper quadrant, bowel sounds normal Central nervous system: Alert and oriented and grossly nonfocal Extremities: 2+ leg edema, no tenderness  Skin: No rashes seen Psychiatry: Mood is appropriate   Data Reviewed: I have personally reviewed following labs and imaging studies  CBC: Recent Labs  Lab 02/08/20 1330 02/08/20 1425 02/09/20 0908 02/10/20 0357 02/11/20 0330 02/12/20 0309  WBC 4.9 4.4 3.9* 2.6* 2.0* 1.5*  NEUTROABS 3.2 3.1  --   --  1.2* 0.7*  HGB 10.1* 10.5* 9.1* 7.9* 8.0* 7.7*  HCT 31.3* 32.6* 28.2* 24.5* 25.4* 25.0*  MCV 102.0* 101.6* 102.9* 102.5* 102.4* 103.3*  PLT 202 212 155 142* 162 128    Basic Metabolic Panel: Recent Labs  Lab 02/08/20 1425 02/09/20 0908 02/10/20 0357 02/11/20 0335 02/12/20 0309  NA 135 136 134* 135 137  K 3.3* 2.9* 3.0* 3.4* 3.1*  CL 95* 94* 96* 98 101  CO2 28 32 27 26 26   GLUCOSE 166* 113* 118* 146* 120*  BUN 35* 29* 29* 26* 20  CREATININE 2.36* 1.82* 1.62* 1.47* 1.26*  CALCIUM 9.2 8.4* 7.9* 7.8* 7.8*  MG  --  1.4* 1.9 1.7  --     GFR: Estimated Creatinine Clearance: 101 mL/min (A) (by C-G formula based on SCr of 1.26 mg/dL (H)).  Liver Function Tests: Recent Labs  Lab 02/08/20 1330 02/08/20 1425 02/09/20 0908 02/11/20 0335 02/12/20 0309  AST 27 33 25 21 39  ALT 28 32  22 19 24   ALKPHOS 68 76 68 61 56  BILITOT 0.9 1.0 1.1 0.7 0.4  PROT 6.1* 6.3* 5.5* 5.1* 5.0*  ALBUMIN 3.9 3.8 3.2* 2.8* 2.7*    CBG: No results for input(s): GLUCAP in the last 168 hours.   Recent Results (from the past 240 hour(s))  Culture, blood (Routine x 2)     Status: None (Preliminary result)   Collection Time: 02/08/20  2:25 PM   Specimen: Left Antecubital; Blood  Result Value Ref Range Status   Specimen Description   Final    LEFT ANTECUBITAL Performed at [facility], [address] [place], [place] [address]    Special Requests   Final    BOTTLES DRAWN AEROBIC AND ANAEROBIC Blood Culture adequate volume Performed at [facility], [address] [place], [place] [address]    Culture   Final    NO GROWTH 4 DAYS Performed at [facility], [place] [address], [place], [place] [address]    Report Status PENDING  Incomplete  Resp Panel by RT-PCR (Flu A&B, Covid) Nasopharyngeal Swab     Status: None   Collection Time: 02/08/20  2:43 PM   Specimen: Nasopharyngeal Swab; Nasopharyngeal(NP) swabs in vial transport medium  Result Value Ref Range Status   SARS Coronavirus 2 by RT PCR NEGATIVE NEGATIVE Final    Comment: (NOTE) SARS-CoV-2 target nucleic acids are NOT DETECTED.  The SARS-CoV-2 RNA is generally detectable in upper respiratory specimens during the acute phase of infection. The lowest concentration of SARS-CoV-2 viral copies this assay can detect is 138 copies/mL. A negative result does not preclude SARS-Cov-2 infection and should not be used as the sole basis for treatment or other patient management decisions. A negative result may occur with  improper specimen collection/handling, submission of specimen other than nasopharyngeal swab, presence of viral mutation(s) within the areas targeted by this assay,  and inadequate number of viral copies(<138 copies/mL). A negative result must be combined with clinical observations, patient  history, and epidemiological information. The expected result is Negative.  Fact Sheet for Patients:  EntrepreneurPulse.com.au  Fact Sheet for Healthcare Providers:  IncredibleEmployment.be  This test is no t yet approved or cleared by the [place] FDA and  has been authorized for detection and/or diagnosis of SARS-CoV-2 by FDA under an Emergency Use Authorization (EUA). This EUA will remain  in effect (meaning this test can be used) for the duration of the COVID-19 declaration under Section 564(b)(1) of the Act, 21 U.S.C.section 360bbb-3(b)(1), unless the authorization is terminated  or revoked sooner.       Influenza A by PCR NEGATIVE NEGATIVE Final   Influenza B by PCR NEGATIVE NEGATIVE Final    Comment: (NOTE) The Xpert Xpress SARS-CoV-2/FLU/RSV plus assay is intended as an aid in the diagnosis of influenza from Nasopharyngeal swab specimens and should not be used as a sole basis for treatment. Nasal washings and aspirates are unacceptable for Xpert Xpress SARS-CoV-2/FLU/RSV testing.  Fact Sheet for Patients: EntrepreneurPulse.com.au  Fact Sheet for Healthcare Providers: IncredibleEmployment.be  This test is not yet approved or cleared by the [place] FDA and has been authorized for detection and/or diagnosis of SARS-CoV-2 by FDA under an Emergency Use Authorization (EUA). This EUA will remain in effect (meaning this test can be used) for the duration of the COVID-19 declaration under Section 564(b)(1) of the Act, 21 U.S.C. section 360bbb-3(b)(1), unless the authorization is terminated or revoked.  Performed at [facility], [address] [place], [place] [address]   Culture, blood (Routine x 2)     Status: None (Preliminary result)   Collection Time: 02/08/20  3:50 PM   Specimen: Porta Cath; Blood  Result Value Ref Range Status   Specimen Description   Final    PORTA  CATH Performed at [facility], [address] [place], [place] [address]    Special Requests   Final    BOTTLES DRAWN AEROBIC AND ANAEROBIC Blood Culture adequate volume Performed at [facility], [address] [place], [place] [address]    Culture   Final    NO GROWTH 4 DAYS Performed at [facility], [place] [address] [place], [place] [address]    Report Status PENDING  Incomplete  Urine culture     Status: None   Collection Time: 02/08/20  5:10 PM   Specimen: In/Out Cath Urine  Result Value Ref Range Status   Specimen Description   Final    IN/OUT CATH URINE Performed at [facility], [address] [place], [place] [address]    Special Requests   Final    NONE Performed at [facility], [address] [place], [place] [address]    Culture   Final    NO GROWTH Performed at [facility], [place] [address], [place], [place] [address]    Report Status 02/10/2020 FINAL  Final  Urine Culture     Status: None   Collection Time: 02/09/20  8:13 AM   Specimen: Urine, Clean Catch  Result Value Ref Range Status   Specimen Description   Final    URINE, CLEAN CATCH Performed at [facility], [place] [address] [place], [place] [address]    Special Requests   Final    NONE Performed at [facility], [address] [place], [place] [address]    Culture   Final    NO GROWTH Performed at [facility], [place] [address] [place], [place] [address]    Report Status 02/10/2020 FINAL  Final  Expectorated sputum assessment w rflx to resp cult     Status: None   Collection Time: 02/10/20  1:06 PM   Specimen: Sputum  Result Value Ref Range Status   Specimen Description SPUTUM  Final   Special Requests NONE  Final   Sputum evaluation   Final    THIS SPECIMEN IS ACCEPTABLE FOR SPUTUM CULTURE Performed at [facility], [place] [address], [place], [place] [address]    Report  Status 02/10/2020 FINAL  Final  Culture, respiratory     Status: None   Collection Time: 02/10/20  1:06 PM   Specimen: SPU  Result Value Ref Range Status   Specimen Description   Final    SPUTUM Performed at [place] [address], [place], [place] [address]    Special Requests   Final    NONE Reflexed from [id number] Performed at [facility], [place] [address] [place], [place] [address]    Gram Stain   Final    FEW WBC PRESENT,BOTH PMN AND MONONUCLEAR NO ORGANISMS SEEN    Culture   Final    RARE Normal respiratory flora-no Staph aureus or Pseudomonas seen Performed at [facility], [address], [place], [place] [address]    Report Status 02/12/2020 FINAL  Final  MRSA PCR Screening     Status: None   Collection Time: 02/11/20 10:03 AM   Specimen: Nasal Mucosa; Nasopharyngeal  Result Value Ref Range Status   MRSA by PCR NEGATIVE NEGATIVE Final    Comment:        The GeneXpert MRSA Assay (FDA approved for NASAL specimens only), is one component of a comprehensive MRSA colonization surveillance program. It is not intended to diagnose MRSA infection nor to guide or monitor treatment for MRSA infections. Performed at [facility], [place] [address], [place],  [address]          Radiology Studies: No results found.      Scheduled Meds: . apixaban  5 mg Oral BID  . chlorhexidine  15 mL Mouth/Throat QID  . Chlorhexidine Gluconate Cloth  6 each Topical Daily  . fluconazole  100 mg Oral Daily  . furosemide  40 mg Intravenous Once  . furosemide  40 mg Intravenous Once  . furosemide  40 mg Oral BID  . gabapentin  300 mg Oral TID  . levothyroxine  200 mcg Oral Q0600  . levothyroxine  75 mcg Oral QAC breakfast  . methylphenidate  10 mg Oral Daily  . nystatin  5 mL Oral QID  . OLANZapine  10 mg Oral QHS  . oxyCODONE  20 mg Oral Q12H  . sodium chloride flush  10-40 mL Intracatheter Q12H  .  Tbo-Filgrastim  480 mcg Subcutaneous q1800   Continuous Infusions: . sodium chloride Stopped (02/12/20 1059)  . ceFEPime (MAXIPIME) IV 2 g (02/12/20 0500)     LOS: 2 days       Hosie Poisson, MD [facility]   To contact the attending provider between 7A-7P or the covering provider during after hours 7P-7A, please log into the web site [email or web address] and access using universal [place] password for that web site. If you do not have the password, please call  the hospital operator.  02/12/2020, 4:03 PM

## 2020-02-12 NOTE — Consult Note (Signed)
Referral MD  Reason for Referral: Metastatic thyroid cancer; leukopenia secondary to chemotherapy; anemia secondary to chemotherapy  Chief Complaint  Patient presents with  . Shortness of Breath  . Fever  : I just got very tired and weak.  HPI: [name] is well-known to me.  Is a very nice 44 year old white male.  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  He has been on different lines of chemotherapy.  He currently is on single agent Adriamycin.  I think he has last treatment about 2 weeks ago.  He is in the office on the 23rd.  He was very weak.  He was quite tachycardic.  He went to the emergency room and was admitted.  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  He had Dopplers done of his legs because of swelling.  This was done on the 24th.  Dopplers were negative.  He had a CT scan of the brain when he was admitted.  The CT scan of the brain did not show any evidence of infarct or metastasis.  He had a CT scan of his abdomen.  He had the liver metastasis.  He had a lymph node that was periportal.  This was noted on a prior MRI.  I think he was given IV antibiotics.  He is on Maxipime right now.  Cultures were all negative.  His lab work today shows white count 1.5.  Hemoglobin 7.7.  Platelet count 183,000.  His BUN is 20 creatinine 1.26.  This is clearly improved.  His albumin is 2.7.  His liver function studies are okay.  Bilirubin was 0.4.  He has had pain problems.  Hip pain is left leg last night.  It went up his leg to his thigh.  He is had abdominal pain.  He has had pain in the right abdomen because of his liver metastasis.  He has had no diarrhea.  He has had no vomiting.  He has had no chest wall pain.  He has had no rashes.  Currently, his performance status is ECOG 2.    Past Medical History:  Diagnosis Date  . DVT, lower extremity, distal, acute, left ([place]) 01/14/2020  . Essential  hypertension 09/09/2018  . Goals of care, counseling/discussion 01/14/2020  . Kidney stone   . Primary cancer of thyroid with metastasis to other site ([place]) 05/04/2018  . Rickettsia infection   :  Past Surgical History:  Procedure Laterality Date  . BIOPSY  04/05/2018   Procedure: BIOPSY;  Surgeon: Ronald Lobo, MD;  Location: [facility];  Service: Endoscopy;;  . CHOLECYSTECTOMY    . ESOPHAGOGASTRODUODENOSCOPY (EGD) WITH PROPOFOL N/A 04/05/2018   Procedure: ESOPHAGOGASTRODUODENOSCOPY (EGD) WITH PROPOFOL;  Surgeon: Ronald Lobo, MD;  Location: [facility];  Service: Endoscopy;  Laterality: N/A;  . IR IMAGING GUIDED PORT INSERTION  09/13/2019  . IR RADIOLOGIST EVAL & MGMT  10/11/2018  :   Current Facility-Administered Medications:  .  0.9 %  sodium chloride infusion (Manually program via Guardrails IV Fluids), , Intravenous, Once, Wynne Jury, Rudell Cobb, MD .  0.9 %  sodium chloride infusion, , Intravenous, PRN, Cox, Amy N, DO, Last Rate: 10 mL/hr at 02/11/20 0604, 250 mL at 02/11/20 0604 .  apixaban (ELIQUIS) tablet 5 mg, 5 mg, Oral, BID, Cox, Amy N, DO, 5 mg at 02/11/20 2055 .  ceFEPIme (MAXIPIME) 2 g in sodium chloride 0.9 % 100 mL IVPB, 2 g, Intravenous,  Q8H, Luiz Ochoa, RPH, Last Rate: 200 mL/hr at 02/12/20 0500, 2 g at 02/12/20 0500 .  chlorhexidine (PERIDEX) 0.12 % solution 15 mL, 15 mL, Mouth/Throat, QID, Natori Gudino, Rudell Cobb, MD .  Chlorhexidine Gluconate Cloth 2 % PADS 6 each, 6 each, Topical, Daily, Lenore Cordia, MD, 6 each at 02/10/20 [id number] .  cloNIDine (CATAPRES) tablet 0.1 mg, 0.1 mg, Oral, BID PRN, Cox, Amy N, DO .  diphenhydrAMINE (BENADRYL) capsule 25 mg, 25 mg, Oral, Q6H PRN, Hosie Poisson, MD, 25 mg at 02/10/20 1957 .  fluconazole (DIFLUCAN) tablet 100 mg, 100 mg, Oral, Daily, Cox, Amy N, DO, 100 mg at 02/11/20 0957 .  furosemide (LASIX) injection 40 mg, 40 mg, Intravenous, Once, Rasaan Brotherton, Rudell Cobb, MD .  furosemide (LASIX) injection 40 mg, 40 mg, Intravenous, Once,  Remberto Lienhard, Rudell Cobb, MD .  furosemide (LASIX) tablet 40 mg, 40 mg, Oral, BID, Hosie Poisson, MD, 40 mg at 02/11/20 1715 .  gabapentin (NEURONTIN) capsule 300 mg, 300 mg, Oral, TID, Cox, Amy N, DO, 300 mg at 02/11/20 2054 .  hydrocortisone cream 1 %, , Topical, BID PRN, Hosie Poisson, MD, Given at 02/11/20 1548 .  HYDROmorphone (DILAUDID) injection 1 mg, 1 mg, Intravenous, Q4H PRN, Hosie Poisson, MD, 1 mg at 02/11/20 0814 .  levothyroxine (SYNTHROID) tablet 200 mcg, 200 mcg, Oral, Q0600, Cox, Amy N, DO, 200 mcg at 02/12/20 0501 .  levothyroxine (SYNTHROID) tablet 75 mcg, 75 mcg, Oral, QAC breakfast, Hosie Poisson, MD, 75 mcg at 02/12/20 0501 .  LORazepam (ATIVAN) tablet 0.5 mg, 0.5 mg, Oral, Q6H PRN, Cox, Amy N, DO .  magic mouthwash, 10 mL, Oral, QID PRN, Cox, Amy N, DO .  methylphenidate (RITALIN) tablet 10 mg, 10 mg, Oral, Daily, Cox, Amy N, DO, 10 mg at 02/11/20 0956 .  metoCLOPramide (REGLAN) tablet 10 mg, 10 mg, Oral, Q6H PRN, Cox, Amy N, DO .  nystatin (MYCOSTATIN) 100000 UNIT/ML suspension 500,000 Units, 5 mL, Oral, QID, Hosie Poisson, MD, 500,000 Units at 02/11/20 2054 .  OLANZapine (ZYPREXA) tablet 10 mg, 10 mg, Oral, QHS, Cox, Amy N, DO, 10 mg at 02/11/20 2056 .  ondansetron (ZOFRAN) tablet 4 mg, 4 mg, Oral, Q6H PRN **OR** ondansetron (ZOFRAN) injection 4 mg, 4 mg, Intravenous, Q6H PRN, Cox, Amy N, DO .  oxyCODONE (OXYCONTIN) 12 hr tablet 20 mg, 20 mg, Oral, Q12H, Cox, Amy N, DO, 20 mg at 02/11/20 2053 .  oxyCODONE-acetaminophen (PERCOCET/ROXICET) 5-325 MG per tablet 1-2 tablet, 1-2 tablet, Oral, Q4H PRN, Hosie Poisson, MD, 2 tablet at 02/11/20 1009 .  sodium chloride flush (NS) 0.9 % injection 10-40 mL, 10-40 mL, Intracatheter, Q12H, Hosie Poisson, MD, 10 mL at 02/11/20 0959 .  sodium chloride flush (NS) 0.9 % injection 10-40 mL, 10-40 mL, Intracatheter, PRN, Hosie Poisson, MD .  Tbo-Filgrastim [facility]) injection 480 mcg, 480 mcg, Subcutaneous, q1800, Macario Shear, Rudell Cobb,  MD  Facility-Administered Medications Ordered in Other Encounters:  .  HYDROmorphone (DILAUDID) injection 4 mg, 4 mg, Intravenous, Once, Arelene Moroni, Rudell Cobb, MD:  . sodium chloride   Intravenous Once  . apixaban  5 mg Oral BID  . chlorhexidine  15 mL Mouth/Throat QID  . Chlorhexidine Gluconate Cloth  6 each Topical Daily  . fluconazole  100 mg Oral Daily  . furosemide  40 mg Intravenous Once  . furosemide  40 mg Intravenous Once  . furosemide  40 mg Oral BID  . gabapentin  300 mg Oral TID  . levothyroxine  200 mcg Oral Q0600  .  levothyroxine  75 mcg Oral QAC breakfast  . methylphenidate  10 mg Oral Daily  . nystatin  5 mL Oral QID  . OLANZapine  10 mg Oral QHS  . oxyCODONE  20 mg Oral Q12H  . sodium chloride flush  10-40 mL Intracatheter Q12H  . Tbo-Filgrastim  480 mcg Subcutaneous q1800  :  Allergies  Allergen Reactions  . Dextromethorphan-Guaifenesin Other (See Comments)    Irregular heartbeat   . Doxycycline Anaphylaxis, Nausea And Vomiting, Rash and Other (See Comments)    "heart arrythmia" and "dyspepsia" (only oral doxycycline causes reaction)  . Gadobutrol Hives and Other (See Comments)    Patient had MRI scan at [place]. Patient called one hour after he left imaging facility to report two "blisters" that came up on "back" of lip.    . Gadolinium Derivatives Hives    Patient had MRI scan at [facility]. Patient called one hour after he left imaging facility to report two "blisters" that came up on "back" of lip.   . Guaifenesin Palpitations       . Ibuprofen Hives and Itching  . Lisinopril Other (See Comments)    Angioedema  . Pantoprazole Itching  . Tramadol Hives and Itching  . Barium Rash    Developed redness around neck after drinking 1st bottle of Barocat; pt was given Benedryl by ED Mds to be "on the safe side" before drinking 2nd bottle  . Chlorhexidine Hives  :  Family History  Problem Relation Age of Onset  . Diabetes Mother   . Heart  disease Mother   . Heart failure Mother   . Heart attack Mother   . Hyperlipidemia Mother   . Hypertension Mother   . Hyperlipidemia Father   . Allergic rhinitis Father   . Rashes / Skin problems Father   . Sudden death Neg Hx   . Thyroid disease Neg Hx   :  Social History   Socioeconomic History  . Marital status: Married    Spouse name: Not on file  . Number of children: Not on file  . Years of education: Not on file  . Highest education level: Not on file  Occupational History  . Not on file  Tobacco Use  . Smoking status: Never Smoker  . Smokeless tobacco: Never Used  Vaping Use  . Vaping Use: Never used  Substance and Sexual Activity  . Alcohol use: No  . Drug use: No  . Sexual activity: Not on file  Other Topics Concern  . Not on file  Social History Narrative  . Not on file   Social Determinants of Health   Financial Resource Strain: Not on file  Food Insecurity: Not on file  Transportation Needs: Not on file  Physical Activity: Not on file  Stress: Not on file  Social Connections: Not on file  Intimate Partner Violence: Not on file  :  Review of Systems  Constitutional: Positive for malaise/fatigue.  HENT: Positive for sore throat.   Eyes: Negative.   Respiratory: Positive for shortness of breath.   Cardiovascular: Positive for palpitations.  Gastrointestinal: Positive for abdominal pain.  Genitourinary: Negative.   Musculoskeletal: Positive for myalgias.  Skin: Negative.   Neurological: Positive for dizziness and weakness.  Endo/Heme/Allergies: Negative.   Psychiatric/Behavioral: Negative.      Exam:    This is a well-developed well-nourished white male.  He is a little bit tired.  Vital signs show temperature 98.  Pulse 102.  Blood pressure is 125/78.  Head and neck exam shows no scleral icterus.  He has no obvious mucositis in the mouth.  He has no adenopathy in the neck.  There is some pallor in the conjunctivo-.  His lungs sound clear  bilaterally.  Cardiac exam is tachycardic but regular.  He has no murmurs, rubs or bruits.  Abdomen is obese but soft.  He has decent bowel sounds.  There is no fluid wave.  There is no obvious hepatomegaly.  There are some tenderness to palpation over on the right side of his abdomen.  Extremities shows 2+ edema in his legs.  He may have little bit of erythema in the pretibial region of the lower legs.  Neurological exam is nonfocal.  Patient Vitals for the past 24 hrs:  BP Temp Temp src Pulse Resp SpO2  02/12/20 0454 125/78 98 F (36.7 C) -- (!) 102 20 97 %  02/12/20 0227 (!) 141/84 98.1 F (36.7 C) Oral (!) 103 19 95 %  02/11/20 1407 (!) 141/82 98.7 F (37.1 C) Oral (!) 108 18 99 %     Recent Labs    02/11/20 0330 02/12/20 0309  WBC 2.0* 1.5*  HGB 8.0* 7.7*  HCT 25.4* 25.0*  PLT 162 183   Recent Labs    02/11/20 0335 02/12/20 0309  NA 135 137  K 3.4* 3.1*  CL 98 101  CO2 26 26  GLUCOSE 146* 120*  BUN 26* 20  CREATININE 1.47* 1.26*  CALCIUM 7.8* 7.8*    Blood smear review: None  Pathology: None    Assessment and Plan: [name] is a very nice 44 year old white male.  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  His liver metastasis.  He is on single agent Adriamycin.  I think that he would clearly benefit from a blood transfusion.  I think this would help with the swelling in his legs.  The swelling is probably from the anemia and also I am sure from his IV fluids.  I will also put on some Neupogen.  His white cell count is going down.  Thankfully, cultures were all negative so far.  I will put him on some Peridex mouth rinse.  We will see if this can help with his mouth.  I know this is a very tough situation.  He has a tumor that definitely is not that chemosensitive.  I know that he is trying all that he can try.  I appreciate all the help that he is getting.  Hopefully, we can improve his quality of life so he can go home and enjoy himself.  I  appreciate all the great care that he is getting from the staff on 4 E.  Lattie Haw, MD  Romans 1:16

## 2020-02-12 NOTE — TOC Initial Note (Signed)
Transition of Care [facility]) - Initial/Assessment Note    Patient Details  Name: Brian [name] MRN: [id number] Date of Birth: 1975-04-01  Transition of Care [facility]) CM/SW Contact:    Dessa Phi, RN Phone Number: 02/12/2020, 12:42 PM  Clinical Narrative:From home. Thyroid Ca. Oncology following. No anticipated d/c needs.                   Expected Discharge Plan: Home/Self Care Barriers to Discharge: Continued Medical Work up   Patient Goals and CMS Choice Patient states their goals for this hospitalization and ongoing recovery are:: go home      Expected Discharge Plan and Services Expected Discharge Plan: Home/Self Care   Discharge Planning Services: CM Consult   Living arrangements for the past 2 months: Single Family Home                                      Prior Living Arrangements/Services Living arrangements for the past 2 months: Single Family Home Lives with:: Spouse Patient language and need for interpreter reviewed:: Yes Do you feel safe going back to the place where you live?: Yes      Need for Family Participation in Patient Care: No (Comment) Care giver support system in place?: Yes (comment)   Criminal Activity/Legal Involvement Pertinent to Current Situation/Hospitalization: No - Comment as needed  Activities of Daily Living Home Assistive Devices/Equipment: None ADL Screening (condition at time of admission) Patient's cognitive ability adequate to safely complete daily activities?: Yes Is the patient deaf or have difficulty hearing?: No Does the patient have difficulty seeing, even when wearing glasses/contacts?: No Does the patient have difficulty concentrating, remembering, or making decisions?: No Patient able to express need for assistance with ADLs?: Yes Does the patient have difficulty dressing or bathing?: No Independently performs ADLs?: Yes (appropriate for developmental age) Does the patient have difficulty walking or climbing  stairs?: No Weakness of Legs: None Weakness of Arms/Hands: None  Permission Sought/Granted Permission sought to share information with : Case Manager Permission granted to share information with : Yes, Verbal Permission Granted  Share Information with NAME: Case Manager     Permission granted to share info w Relationship: Tiffany spouse [phone]     Emotional Assessment Appearance:: Appears stated age Attitude/Demeanor/Rapport: Gracious Affect (typically observed): Accepting Orientation: : Oriented to Self,Oriented to Place,Oriented to  Time,Oriented to Situation Alcohol / Substance Use: Not Applicable Psych Involvement: No (comment)  Admission diagnosis:  AKI (acute kidney injury) ([place]) [N17.9] Sepsis ([place]) [A41.9] Sepsis, due to unspecified organism, unspecified whether acute organ dysfunction present [facility]) [A41.9] Patient Active Problem List   Diagnosis Date Noted  . Obesity, Class III, BMI 40-49.9 (morbid obesity) ([place]) 02/09/2020  . Hypothyroidism 02/09/2020  . AKI (acute kidney injury) ([place]) 02/09/2020  . Sepsis ([place]) 02/08/2020  . Goals of care, counseling/discussion 01/14/2020  . DVT, lower extremity, distal, acute, left ([place]) 01/14/2020  . Pulmonary embolism without acute cor pulmonale ([place]) 01/13/2020  . Essential hypertension 09/09/2018  . Primary cancer of thyroid with metastasis to other site ([place]) 05/04/2018  . Extrasystole 04/26/2018  . Dyspnea on exertion 04/26/2018  . Atypical chest pain 04/26/2018  . Ureteral stone with hydronephrosis 02/15/2018  . Right thyroid nodule 04/09/2017  . Bilateral knee pain 07/01/2016  . Left foot pain 02/06/2016  . Gastroesophageal reflux disease without esophagitis 05/08/2014  . Thrombocytopenia, unspecified ([place]) 06/13/2013  . Polyarticular  arthritis 06/13/2013  . Low back pain 05/12/2012  . Allergic rhinitis 06/03/2009   PCP:  Sharyne Richters, MD Pharmacy:   [facility], [address] [place] [address] Phone: [phone] Fax: [phone]  [id number] [place], [address] [address] Phone: [phone] Fax: [phone]  [place], [place] - [address] [address] Phone: [phone] Fax: [phone]  [id number] - [place], [place] - [address] [place] [address] [place] [address] [place] [address] Phone: [phone] Fax: [phone]  [id number] - [place], [place] [address] [place] [address] Phone: [phone] Fax: [phone]  [id number] - [place], [place] - [address] AT [place] [address] [place] [address] Phone: [phone] Fax: [phone]  [facility] [place], [place] [address] Phone: [phone] Fax: [phone]     Social Determinants of Health (SDOH) Interventions    Readmission Risk Interventions Readmission Risk Prevention Plan 02/12/2020 01/15/2020  Transportation Screening Complete Complete  PCP or Specialist Appt within 3-5 Days Complete -  HRI or Home Care Consult Complete -  Social Work Consult for [place] Planning/Counseling Complete -  Palliative Care Screening Complete -  Medication Review Press photographer) Complete Complete  PCP or Specialist appointment within 3-5 days of discharge - Complete  HRI or [place] - Complete  SW Recovery Care/Counseling Consult - Complete  [place] - Not Applicable  Some recent data might be hidden

## 2020-02-13 ENCOUNTER — Other Ambulatory Visit (HOSPITAL_BASED_OUTPATIENT_CLINIC_OR_DEPARTMENT_OTHER): Payer: Self-pay | Admitting: Internal Medicine

## 2020-02-13 DIAGNOSIS — R5383 Other fatigue: Secondary | ICD-10-CM | POA: Diagnosis not present

## 2020-02-13 DIAGNOSIS — R509 Fever, unspecified: Secondary | ICD-10-CM | POA: Diagnosis not present

## 2020-02-13 DIAGNOSIS — N179 Acute kidney failure, unspecified: Secondary | ICD-10-CM | POA: Diagnosis not present

## 2020-02-13 DIAGNOSIS — I10 Essential (primary) hypertension: Secondary | ICD-10-CM | POA: Diagnosis not present

## 2020-02-13 DIAGNOSIS — R0602 Shortness of breath: Secondary | ICD-10-CM | POA: Diagnosis not present

## 2020-02-13 DIAGNOSIS — R531 Weakness: Secondary | ICD-10-CM | POA: Diagnosis not present

## 2020-02-13 LAB — CBC WITH DIFFERENTIAL/PLATELET
Abs Immature Granulocytes: 0.07 10*3/uL (ref 0.00–0.07)
Basophils Absolute: 0 10*3/uL (ref 0.0–0.1)
Basophils Relative: 1 %
Eosinophils Absolute: 0.1 10*3/uL (ref 0.0–0.5)
Eosinophils Relative: 3 %
HCT: 33.3 % — ABNORMAL LOW (ref 39.0–52.0)
Hemoglobin: 10.5 g/dL — ABNORMAL LOW (ref 13.0–17.0)
Immature Granulocytes: 2 %
Lymphocytes Relative: 15 %
Lymphs Abs: 0.6 10*3/uL — ABNORMAL LOW (ref 0.7–4.0)
MCH: 31.1 pg (ref 26.0–34.0)
MCHC: 31.5 g/dL (ref 30.0–36.0)
MCV: 98.5 fL (ref 80.0–100.0)
Monocytes Absolute: 0.5 10*3/uL (ref 0.1–1.0)
Monocytes Relative: 13 %
Neutro Abs: 2.5 10*3/uL (ref 1.7–7.7)
Neutrophils Relative %: 66 %
Platelets: 231 10*3/uL (ref 150–400)
RBC: 3.38 MIL/uL — ABNORMAL LOW (ref 4.22–5.81)
RDW: 19.2 % — ABNORMAL HIGH (ref 11.5–15.5)
WBC: 3.8 10*3/uL — ABNORMAL LOW (ref 4.0–10.5)
nRBC: 0 % (ref 0.0–0.2)

## 2020-02-13 LAB — TYPE AND SCREEN
ABO/RH(D): O NEG
Antibody Screen: NEGATIVE
Unit division: 0
Unit division: 0

## 2020-02-13 LAB — CULTURE, BLOOD (ROUTINE X 2)
Culture: NO GROWTH
Culture: NO GROWTH
Special Requests: ADEQUATE
Special Requests: ADEQUATE

## 2020-02-13 LAB — BPAM RBC
Blood Product Expiration Date: 202201272359
Blood Product Expiration Date: 202201282359
ISSUE DATE / TIME: 202112271314
ISSUE DATE / TIME: 202112272046
Unit Type and Rh: 9500
Unit Type and Rh: 9500

## 2020-02-13 LAB — COMPREHENSIVE METABOLIC PANEL
ALT: 40 U/L (ref 0–44)
AST: 56 U/L — ABNORMAL HIGH (ref 15–41)
Albumin: 3.1 g/dL — ABNORMAL LOW (ref 3.5–5.0)
Alkaline Phosphatase: 72 U/L (ref 38–126)
Anion gap: 10 (ref 5–15)
BUN: 21 mg/dL — ABNORMAL HIGH (ref 6–20)
CO2: 29 mmol/L (ref 22–32)
Calcium: 8.6 mg/dL — ABNORMAL LOW (ref 8.9–10.3)
Chloride: 100 mmol/L (ref 98–111)
Creatinine, Ser: 1.13 mg/dL (ref 0.61–1.24)
GFR, Estimated: >60 mL/min (ref >60)
Glucose, Bld: 115 mg/dL — ABNORMAL HIGH (ref 70–99)
Potassium: 3.4 mmol/L — ABNORMAL LOW (ref 3.5–5.1)
Sodium: 139 mmol/L (ref 135–145)
Total Bilirubin: 0.8 mg/dL (ref 0.3–1.2)
Total Protein: 5.8 g/dL — ABNORMAL LOW (ref 6.5–8.1)

## 2020-02-13 MED ORDER — FUROSEMIDE 10 MG/ML IJ SOLN
40.0000 mg | Freq: Once | INTRAMUSCULAR | Status: AC
  Administered 2020-02-13: 40 mg via INTRAVENOUS
  Filled 2020-02-13: qty 4

## 2020-02-13 MED ORDER — NYSTATIN 100000 UNIT/ML MT SUSP: 5.0000 mL | Freq: Four times a day (QID) | OROMUCOSAL | 0 refills | Status: DC

## 2020-02-13 MED ORDER — LEVOTHYROXINE SODIUM 200 MCG PO TABS: 200.0000 ug | ORAL_TABLET | Freq: Every day | ORAL | 1 refills | Status: DC

## 2020-02-13 MED ORDER — FUROSEMIDE 10 MG/ML IJ SOLN: 40.0000 mg | Freq: Once | INTRAMUSCULAR | Status: DC

## 2020-02-13 MED ORDER — POTASSIUM CHLORIDE 10 MEQ/50ML IV SOLN
10.0000 meq | INTRAVENOUS | Status: AC
  Administered 2020-02-13 (×3): 10 meq via INTRAVENOUS
  Filled 2020-02-13 (×3): qty 50

## 2020-02-13 MED ORDER — LEVOTHYROXINE SODIUM 75 MCG PO TABS: 75.0000 ug | ORAL_TABLET | Freq: Every day | ORAL | 0 refills | Status: DC

## 2020-02-13 MED ORDER — HEPARIN SOD (PORK) LOCK FLUSH 100 UNIT/ML IV SOLN
500.0000 [IU] | INTRAVENOUS | Status: AC | PRN
  Administered 2020-02-13: 500 [IU]
  Filled 2020-02-13: qty 5

## 2020-02-13 NOTE — Progress Notes (Signed)
Ms. Brian [name] is looking better this morning.  He feels better.  He got 2 units of blood yesterday.  He got Neupogen.  His hemoglobin is 10.5.  His white cell count is 3.8.  He still has little bit of swelling in the legs.  Had a fairly good diuresis yesterday with the Lasix between the and after his blood transfusion.  I think I will give him a dose of Lasix today.  His BUN is 21 creatinine 1.13.  His potassium 3.4.  His platelet count is 231,000.  His appetite is doing okay.  He is not having any problems with diarrhea.  There is little bit of erythema around his Port-A-Cath where he has little bit of a allergic reaction to the cleansing solution.  He has had no mouth sores.  His pain seems to be doing better.  He has had no bleeding.  He is on Eliquis for thromboembolic disease.  He would like to go home today.  I would think this would be okay from my point of view.  Again, his white cell count is better.  He responded well to the 2 units of blood.  His vital signs show a temperature of 98.1.  Pulse 107.  Blood pressure 130/86.  His lungs are clear bilaterally.  No wheezes are noted.  Cardiac exam tachycardic but regular.  Oral exam shows no mucositis.  Abdomen soft.  Is a little bit obese.  Bowel sounds are present.  Extremities show some 1+ edema in his lower legs.  Neurological exam is nonfocal.  Again, is hard to say what happened to [name].  All cultures were negative.  He was febrile.  Covid was negative.  There is no thromboembolic disease by lower extremity Dopplers.  I still think that he can go home today.  He feels better.  I think that he would do well at home.  I appreciate the great care that he has been getting from all staff up on 4 E.  Lattie Haw, MD  Jeneen Rinks 4:10

## 2020-02-13 NOTE — Progress Notes (Signed)
Pt given and explained discharge instructions. Port deaccessed by IV team. Pt dressed in personal clothing. All questions answered. Pt taken to main entrance via wheelchair with all belongings.

## 2020-02-13 NOTE — Discharge Summary (Signed)
Physician Discharge Summary  Brian [name] NUU:[id number] DOB: 1975-11-16 DOA: 02/08/2020  PCP: Sharyne Richters, MD  Admit date: 02/08/2020 Discharge date: 02/13/2020  Admitted From: Home Disposition:  Home   Recommendations for Outpatient Follow-up:  1. Follow up with PCP in 1-2 weeks 2. Please obtain BMP/CBC in one week Please follow up with oncology as recommended.   Discharge Condition: stable.  CODE STATUS: Full code.  Diet recommendation: Heart Healthy    Brief/Interim Summary:  44 year old gentleman with prior history of metastatic thyroid cancer with mets were s/p chemotherapy, multiple pulmonary embolism history of DVT in the left popliteal vein, hypertension, obesity, anxiety presents to ED with abdominal pain associated with some nausea.  On arrival he was found to be febrile tachycardic, AKI and he was admitted for sepsis of unclear etiology. . Fever resolved.   Discharge Diagnoses:  Active Problems:   Low back pain   Primary cancer of thyroid with metastasis to other site [facility])   Essential hypertension   Sepsis ([place])   Obesity, Class III, BMI 40-49.9 (morbid obesity) (HCC)   Hypothyroidism   AKI (acute kidney injury) ([place])   Sepsis of unclear etiology./ Febrile Neutropenia.  Patient was started on broad-spectrum IV antibiotics and cultures are pending at this time.  Urine analysis is negative, chest x-ray is negative for pneumonia, blood cultures and urine cultures are pending at this time, negative so far.  CT of the abdomen and pelvis with contrast is negative for any acute intra abdominal pathology. Last fever at 9 pm last night. . Pt reports oral candidiasis, diflucan added.  He has productive sputum, sputum cultures ordered. showed Normal respiratory flora- DC IV cefepime tomorrow .   Abdominal pain.  Probably secondary to liver mets Pain control with oral oxycodone and IV Dilaudid.    AKI probably secondary to prerenal causes, Ultrasound of the  kidneys negative for any hydronephrosis. Hold home losartan and nephrotoxins. Renal parameters improving.  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  Further management as per Dr Marin Olp.    Pancytopenia:  Probably secondary to chemotherapy been around 7.7 this morning and Dr. Marin Olp with oncology recommended and ordered 2 units of PRBC transfusion. Absolute neutrophil count decreased to 700 today, platelets have improved Continue to monitor.    Hypokalemia and hypomagnesemia Replaced.  Hypothyroidism Decrease the dose of synthroid from 288 to 275 mcg daily.  tsh is 0.094 and free t4 is 2.4 slightly high.    Anxiety Continue with Ativan.  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   Tachycardia:  Pt denies any chest pain. Echocardiogram showed Mild anteroseptal hypokinesis. Left ventricular ejection fraction, by  estimation, is 50 to 55%. The left ventricle has low normal function. The  left ventricle has no regional wall motion abnormalities.  Left ventricular diastolic parameters are consistent with  Grade I diastolic dysfunction (impaired relaxation).  Get EKG .  Tachycardia probably from hyperthyroid states.    Chronic diastolic heart failure:  Resume Lasix 40 mg BID.   metolazone added today PT has persistent blateral pedal edema.  But he appears compensated.  Recommend outpatient follow up with cardiology.    Discharge Instructions  Discharge Instructions    Diet - low sodium heart healthy   Complete by: As directed    Discharge instructions   Complete by: As directed    Please follow up  with Oncology as scheduled.   Increase activity slowly   Complete by: As directed      Allergies as of 02/13/2020      Reactions   Dextromethorphan-guaifenesin Other (See Comments)   Irregular heartbeat   Doxycycline Anaphylaxis, Nausea  And Vomiting, Rash, Other (See Comments)   "heart arrythmia" and "dyspepsia" (only oral doxycycline causes reaction)   Gadobutrol Hives, Other (See Comments)   Patient had MRI scan at [place]. Patient called one hour after he left imaging facility to report two "blisters" that came up on "back" of lip.    Gadolinium Derivatives Hives   Patient had MRI scan at [place]. Patient called one hour after he left imaging facility to report two "blisters" that came up on "back" of lip.    Guaifenesin Palpitations      Ibuprofen Hives, Itching   Lisinopril Other (See Comments)   Angioedema   Pantoprazole Itching   Tramadol Hives, Itching   Barium Rash   Developed redness around neck after drinking 1st bottle of Barocat; pt was given Benedryl by ED Mds to be "on the safe side" before drinking 2nd bottle   Chlorhexidine Hives      Medication List    STOP taking these medications   azithromycin 250 MG tablet Commonly known as: Zithromax Z-Pak   baclofen 10 MG tablet Commonly known as: LIORESAL   cyclobenzaprine 10 MG tablet Commonly known as: FLEXERIL   Janssen COVID-19 Vaccine 0.5 ML injection Generic drug: COVID-19 Ad26 vaccine (JANSSEN/J&J)     TAKE these medications   apixaban 5 MG Tabs tablet Commonly known as: ELIQUIS Take 1 tablet (5 mg total) by mouth 2 (two) times daily.   cloNIDine 0.1 MG tablet Commonly known as: CATAPRES Take 0.1 mg by mouth 2 (two) times daily as needed (high blood pressure).   EPINEPHrine 0.3 mg/0.3 mL Soaj injection Commonly known as: EpiPen 2-Pak USE AS DIRECTED FOR LIFE THREATENING ALLERGIC REACTIONS What changed:   how much to take  how to take this  when to take this  reasons to take this  additional instructions   fluconazole 100 MG tablet Commonly known as: DIFLUCAN Take 1 tablet (100 mg total) by mouth daily.   furosemide 80 MG tablet Commonly known as: LASIX TAKE 1 TABLET (80 MG TOTAL) BY MOUTH  DAILY. What changed:   when to take this  reasons to take this   gabapentin 300 MG capsule Commonly known as: NEURONTIN TAKE 1 CAPSULE (300 MG TOTAL) BY MOUTH 3 (THREE) TIMES DAILY.   levothyroxine 200 MCG tablet Commonly known as: SYNTHROID Take 200 mcg by mouth daily. Take along with 88 mcg=288 mcg What changed: Another medication with the same name was changed. Make sure you understand how and when to take each.   levothyroxine 75 MCG tablet Commonly known as: SYNTHROID Take 1 tablet (75 mcg total) by mouth daily before breakfast. Start taking on: February 14, 2020 What changed:   medication strength  how much to take  additional instructions   lidocaine-prilocaine cream Commonly known as: EMLA Apply to affected area once   LORazepam 0.5 MG tablet Commonly known as: ATIVAN Take 1 tablet (0.5 mg total) by mouth every 6 (six) hours as needed (Nausea or vomiting).   losartan 25 MG tablet Commonly known as: COZAAR Take 1 tablet (25 mg total) by mouth 2 (two) times daily. What changed:   when to take this  reasons to take this   magic mouthwash Soln Take  10 mLs by mouth 4 (four) times daily as needed for mouth pain.   methylphenidate 10 MG tablet Commonly known as: RITALIN Take 1 tablet (10 mg total) by mouth 2 (two) times daily. What changed: when to take this   metoCLOPramide 10 MG tablet Commonly known as: REGLAN TAKE 1 TABLET BY MOUTH FOUR TIMES DAILY BEFORE MEALS AND AT BEDTIME What changed: See the new instructions.   metolazone 5 MG tablet Commonly known as: ZAROXOLYN TAKE 1 TABLET (5 MG TOTAL) BY MOUTH DAILY. TAKE 1 HOUR BEFORE LASIX. What changed:   when to take this  reasons to take this  additional instructions   multivitamin capsule Take 1 capsule by mouth daily.   nystatin 100000 UNIT/ML suspension Commonly known as: MYCOSTATIN Take 5 mLs (500,000 Units total) by mouth 4 (four) times daily.   OLANZapine 10 MG tablet Commonly  known as: ZYPREXA Take 10 mg by mouth at bedtime.   oxyCODONE 20 mg 12 hr tablet Commonly known as: OXYCONTIN Take 1 tablet (20 mg total) by mouth every 12 (twelve) hours. What changed: Another medication with the same name was changed. Make sure you understand how and when to take each.   Oxycodone HCl 10 MG Tabs Take 1 tablet (10 mg total) by mouth every 6 (six) hours as needed. What changed: reasons to take this   potassium chloride 10 MEQ tablet Commonly known as: KLOR-CON TAKE 1 TABLET (10 MEQ TOTAL) BY MOUTH DAILY.   prochlorperazine 10 MG tablet Commonly known as: COMPAZINE Take 1 tablet (10 mg total) by mouth every 6 (six) hours as needed (Nausea or vomiting).   sucralfate 1 GM/10ML suspension Commonly known as: Carafate Take 10 mLs (1 g total) by mouth 4 (four) times daily -  with meals and at bedtime. What changed:   when to take this  reasons to take this       Allergies  Allergen Reactions  . Dextromethorphan-Guaifenesin Other (See Comments)    Irregular heartbeat   . Doxycycline Anaphylaxis, Nausea And Vomiting, Rash and Other (See Comments)    "heart arrythmia" and "dyspepsia" (only oral doxycycline causes reaction)  . Gadobutrol Hives and Other (See Comments)    Patient had MRI scan at [place]. Patient called one hour after he left imaging facility to report two "blisters" that came up on "back" of lip.    . Gadolinium Derivatives Hives    Patient had MRI scan at [place]. Patient called one hour after he left imaging facility to report two "blisters" that came up on "back" of lip.   . Guaifenesin Palpitations       . Ibuprofen Hives and Itching  . Lisinopril Other (See Comments)    Angioedema  . Pantoprazole Itching  . Tramadol Hives and Itching  . Barium Rash    Developed redness around neck after drinking 1st bottle of Barocat; pt was given Benedryl by ED Mds to be "on the safe side" before drinking 2nd bottle  .  Chlorhexidine Hives    Consultations:  Oncology.    Procedures/Studies: CT Head Wo Contrast  Result Date: 02/08/2020 CLINICAL DATA:  Right-sided back and abdominal pain, fever, cough, left facial numbness, history of metastatic thyroid cancer EXAM: CT HEAD WITHOUT CONTRAST TECHNIQUE: Contiguous axial images were obtained from the base of the skull through the vertex without intravenous contrast. COMPARISON:  01/23/2020 FINDINGS: Brain: No acute infarct or hemorrhage. Lateral ventricles and midline structures are unremarkable. No acute extra-axial fluid collections. No mass effect.  Vascular: No hyperdense vessel or unexpected calcification. Skull: Normal. Negative for fracture or focal lesion. Sinuses/Orbits: Mucosal thickening ethmoid air cells. Other: None. IMPRESSION: 1. Stable head CT, no acute process. Electronically Signed   By: Randa Ngo M.D.   On: 02/08/2020 15:54   CT Head Wo Contrast  Result Date: 01/23/2020 CLINICAL DATA:  Headache EXAM: CT HEAD WITHOUT CONTRAST TECHNIQUE: Contiguous axial images were obtained from the base of the skull through the vertex without intravenous contrast. COMPARISON:  None. FINDINGS: Brain: No evidence of acute territorial infarction, hemorrhage, hydrocephalus,extra-axial collection or mass lesion/mass effect. Normal gray-white differentiation. Ventricles are normal in size and contour. Vascular: No hyperdense vessel or unexpected calcification. Skull: The skull is intact. No fracture or focal lesion identified. Sinuses/Orbits: The visualized paranasal sinuses and mastoid air cells are clear. The orbits and globes intact. Other: None IMPRESSION: No acute intracranial abnormality. Electronically Signed   By: Prudencio Pair M.D.   On: 01/23/2020 21:04   CT Angio Chest PE W/Cm &/Or Wo Cm  Result Date: 01/23/2020 CLINICAL DATA:  Short of breath.  Concern pulmonary embolism EXAM: CT ANGIOGRAPHY CHEST WITH CONTRAST TECHNIQUE: Multidetector CT imaging of the  chest was performed using the standard protocol during bolus administration of intravenous contrast. Multiplanar CT image reconstructions and MIPs were obtained to evaluate the vascular anatomy. CONTRAST:  129mL OMNIPAQUE IOHEXOL 350 MG/ML SOLN COMPARISON:  CT a 01/13/2020 FINDINGS: Cardiovascular: Interval resorption of the thromboemboli identified on prior CT 01/13/2020. No evidence of filling defects within the pulmonary arteries to suggest acute pulmonary embolism. Port in the anterior chest wall with tip in distal SVC. Mediastinum/Nodes: No axillary or supraclavicular adenopathy. No mediastinal or hilar adenopathy. No pericardial fluid. Esophagus normal. Low-density lesion in the RIGHT aspect of the thoracic inlet is again demonstrated measuring 4.8 x 2.5 cm compared with 4.2 x 2.4 cm on prior. No significant Brian change Lungs/Pleura: No pulmonary infarction. No pulmonary nodularity. Airways normal. Upper Abdomen: Low-density lesion RIGHT hepatic lobe described on comparison CT is not well appreciated streak artifact from arms. Musculoskeletal: No aggressive osseous lesion. Review of the MIP images confirms the above findings. IMPRESSION: 1. No evidence acute pulmonary embolism. 2. Interval resorption of the thromboemboli identified on prior CT 01/13/2020. 3. No acute pulmonary parenchymal findings. 4. Stable low-density lesion in the RIGHT aspect of the thoracic inlet. 5. Low-density lesion in the liver not well appreciated. Electronically Signed   By: Suzy Bouchard M.D.   On: 01/23/2020 20:59   CT Abdomen Pelvis W Contrast  Result Date: 02/08/2020 CLINICAL DATA:  RIGHT-side abdominal pain radiating to lower abdomen in mid back, has redness at flanks, fever, cough, shortness of breath, episodes of falling asleep; past history of metastatic thyroid cancer, hypertension, kidney stones EXAM: CT ABDOMEN AND PELVIS WITH CONTRAST TECHNIQUE: Multidetector CT imaging of the abdomen and pelvis was performed  using the standard protocol following bolus administration of intravenous contrast. Sagittal and coronal MPR images reconstructed from axial data set. CONTRAST:  41mL OMNIPAQUE IOHEXOL 300 MG/ML SOLN IV. No oral contrast. COMPARISON:  07/27/2019 FINDINGS: Lower chest: Minimal basilar atelectasis Hepatobiliary: Ill-defined masses in RIGHT lobe of liver consistent with metastatic disease. These are less well-defined than on the previous exam due to suboptimal contrast. Gallbladder surgically absent. Pancreas: Atrophic pancreas without mass Spleen: Normal appearance Adrenals/Urinary Tract: Adrenal glands, kidneys, ureters, bladder normal appearance Stomach/Bowel: Normal appendix. Stool throughout colon. Stomach and bowel loops otherwise normal appearance Vascular/Lymphatic: Enlarged portal caval lymph node 4.3 cm short axis image  32, new. No additional adenopathy. Vascular structures grossly patent Reproductive: Unremarkable prostate gland and seminal vesicles Other: No free air or free fluid. Tiny umbilical hernia containing fat. Nonspecific subcutaneous infiltrative changes in the RIGHT mid abdominal wall, may represent medication injection sites. Musculoskeletal: No acute osseous findings. IMPRESSION: Ill-defined masses in RIGHT lobe of liver consistent with metastatic disease, difficult to compared to a previous exam due to suboptimal liver enhancement on current study, suspect stable to minimally improved. Enlarged portal caval lymph node 4.3 cm short axis, new concerning for metastatic disease. Tiny umbilical hernia containing fat. Increased stool throughout colon. Electronically Signed   By: Lavonia Dana M.D.   On: 02/08/2020 16:03   MR LIVER W WO CONTRAST  Result Date: 01/29/2020 CLINICAL DATA:  Metastatic thyroid cancer. Assess treatment response. EXAM: MRI ABDOMEN WITHOUT AND WITH CONTRAST TECHNIQUE: Multiplanar multisequence MR imaging of the abdomen was performed both before and after the administration  of intravenous contrast. CONTRAST:  68mL GADAVIST GADOBUTROL 1 MMOL/ML IV SOLN COMPARISON:  11/28/2019 FINDINGS: Lower chest: No acute findings. Hepatobiliary: Multifocal liver metastases are again noted. Conglomerate lesion in segment 8 measures 6.3 x 4.4 cm, image 30/19. Previously 6.5 x 4.4 cm. Although similar in size there is decreased internal arterial phase enhancement within this structure suggesting treatment response. Index lesion within segment 4 measures 1.3 x 1.2 cm, image 27/19. Previously this measured 1.7 x 1.5 cm. Index lesion within the periphery of segment 5 measures 2.3 x 1.4 cm, image 39/19. Previously this measured 2.4 x 1.7 cm (when remeasured). Segment 2 index lesion has signal and enhancement characteristics favoring a benign hemangioma. This measures 1.3 cm, image 13/4 and appears unchanged. Pancreas: No mass, inflammatory changes, or other parenchymal abnormality identified. Spleen:  Within normal limits in size and appearance. Adrenals/Urinary Tract: Normal appearance of the adrenal glands. No kidney mass or hydronephrosis identified. Stomach/Bowel: Visualized portions within the abdomen are unremarkable. Vascular/Lymphatic: Normal appearance of the abdominal aorta. The portal vein remains patent. Large portal caval lymph node measures 4.5 x 3.2 cm, image 53/21. This is new when compared with the previous exam. Other:  No free fluid or fluid collections. Musculoskeletal: No suspicious bone lesions identified. IMPRESSION: 1. Multifocal liver metastases are again noted. The dominant lesion in segment 8 (which is a conglomerate of multiple smaller lesions) is mildly decreased in size when compared with the previous exam. Additional lesions within segments 4 and 5 also show mild decrease in size in the interval. 2. Large portal caval lymph node is new when compared with the previous exam. Electronically Signed   By: Kerby Moors M.D.   On: 01/29/2020 16:48   US RENAL  Result Date:  02/09/2020 CLINICAL DATA:  Kidney stone. EXAM: RENAL / URINARY TRACT ULTRASOUND COMPLETE COMPARISON:  CT scan 02/08/2020 FINDINGS: Right Kidney: Renal measurements: 12.1 x 6.5 x 4.4 cm = volume: 179 mL. Echogenicity within normal limits. No mass or hydronephrosis visualized. Left Kidney: Renal measurements: 13.2 x 6.9 x 5.2 cm = volume: 245 mL. Echogenicity within normal limits. No mass or hydronephrosis visualized. Bladder: Appears normal for degree of bladder distention. Other: None. IMPRESSION: 1. No evidence for hydronephrosis. No suspicious abnormality identified in either kidney. Electronically Signed   By: Misty Stanley M.D.   On: 02/09/2020 08:06   DG Chest Portable 1 View  Result Date: 02/08/2020 CLINICAL DATA:  Shortness of breath with fever and tachycardia. EXAM: PORTABLE CHEST 1 VIEW COMPARISON:  01/13/2020 FINDINGS: 1434 hours. Low volume lordotic film. Cardiopericardial  silhouette is at upper limits of normal for size. The lungs are clear without focal pneumonia, edema, pneumothorax or pleural effusion. Left Port-A-Cath tip overlies the SVC/RA junction. Telemetry leads overlie the chest. IMPRESSION: Low volume film without acute cardiopulmonary findings. Electronically Signed   By: Misty Stanley M.D.   On: 02/08/2020 14:55   ECHOCARDIOGRAM COMPLETE  Result Date: 02/09/2020    ECHOCARDIOGRAM REPORT   Patient Name:   Brian [name] Date of Exam: 02/09/2020 Medical Rec #:  [id number]    Height:       70.0 in Accession #:    [id number]   Weight:       284.4 lb Date of Birth:  February 01, 1976    BSA:          2.424 m Patient Age:    64 years     BP:           130/92 mmHg Patient Gender: M            HR:           116 bpm. Exam Location:  Inpatient Procedure: 2D Echo, Color Doppler and Cardiac Doppler                                 MODIFIED REPORT:   This report was modified by Skeet Latch MD on 02/09/2020 due to no wall                               motion abnormality.  Indications:     Dyspnea  R06.00  History:         Patient has prior history of Echocardiogram examinations, most                  recent 02/06/2019. Signs/Symptoms:Fever. Primary cancer of                  thyroid with metastasis to other site. Sepsis. Acute kidney                  injury.  Sonographer:     Darlina Sicilian RDCS Referring Phys:  [id number] AMY N [facility] Diagnosing Phys: Skeet Latch MD IMPRESSIONS  1. Mild anteroseptal hypokinesis. Left ventricular ejection fraction, by estimation, is 50 to 55%. The left ventricle has low normal function. The left ventricle has no regional wall motion abnormalities. The left ventricular internal cavity size was mildly dilated. Left ventricular diastolic parameters are consistent with Grade I diastolic dysfunction (impaired relaxation).  2. Right ventricular systolic function is normal. The right ventricular size is normal.  3. The mitral valve is normal in structure. No evidence of mitral valve regurgitation. No evidence of mitral stenosis.  4. The aortic valve is tricuspid. Aortic valve regurgitation is not visualized. No aortic stenosis is present. FINDINGS  Left Ventricle: Mild anteroseptal hypokinesis. Left ventricular ejection fraction, by estimation, is 50 to 55%. The left ventricle has low normal function. The left ventricle has no regional wall motion abnormalities. The left ventricular internal cavity size was mildly dilated. There is no left ventricular hypertrophy. Left ventricular diastolic parameters are consistent with Grade I diastolic dysfunction (impaired relaxation). Right Ventricle: The right ventricular size is normal. No increase in right ventricular wall thickness. Right ventricular systolic function is normal. Left Atrium: Left atrial size was normal in size. Right Atrium: Right atrial size was normal in  size. Pericardium: There is no evidence of pericardial effusion. Mitral Valve: The mitral valve is normal in structure. No evidence of mitral valve regurgitation. No  evidence of mitral valve stenosis. Tricuspid Valve: The tricuspid valve is normal in structure. Tricuspid valve regurgitation is not demonstrated. No evidence of tricuspid stenosis. Aortic Valve: The aortic valve is tricuspid. Aortic valve regurgitation is not visualized. No aortic stenosis is present. Pulmonic Valve: The pulmonic valve was normal in structure. Pulmonic valve regurgitation is not visualized. No evidence of pulmonic stenosis. Aorta: The aortic root is normal in size and structure. Venous: The inferior vena cava was not well visualized. IAS/Shunts: No atrial level shunt detected by color flow Doppler.  LEFT VENTRICLE PLAX 2D LVIDd:         5.80 cm LVIDs:         4.40 cm LV PW:         0.90 cm LV IVS:        1.00 cm  LV Volumes (MOD) LV vol d, MOD A2C: 133.0 ml LV vol d, MOD A4C: 124.0 ml LV vol s, MOD A2C: 63.4 ml LV vol s, MOD A4C: 42.3 ml LV SV MOD A2C:     69.6 ml LV SV MOD A4C:     124.0 ml LV SV MOD BP:      78.9 ml RIGHT VENTRICLE RV S prime:     18.20 cm/s LEFT ATRIUM           Index       RIGHT ATRIUM           Index LA diam:      3.20 cm 1.32 cm/m  RA Area:     12.00 cm LA Vol (A4C): 40.4 ml 16.67 ml/m RA Volume:   23.40 ml  9.65 ml/m  AORTIC VALVE LVOT Vmax:   126.00 cm/s LVOT Vmean:  90.500 cm/s LVOT VTI:    0.163 m MITRAL VALVE MV Area (PHT): 5.54 cm    SHUNTS MV Decel Time: 137 msec    Systemic VTI: 0.16 m MV E velocity: 77.60 cm/s MV A velocity: 98.00 cm/s MV E/A ratio:  0.79 Skeet Latch MD Electronically signed by Skeet Latch MD Signature Date/Time: 02/09/2020/9:26:21 AM    Final (Updated)    VAS [place] LOWER EXTREMITY VENOUS (DVT)  Result Date: 02/09/2020  Lower Venous DVT Study Indications: Recent Hx of DVT.  Risk Factors: DVT left pop dvt 2021. Comparison Study: Previous exam 12/2019 Performing Technologist: Vonzell Schlatter RVT  Examination Guidelines: A complete evaluation includes B-mode imaging, spectral Doppler, color Doppler, and power Doppler as needed of all  accessible portions of each vessel. Bilateral testing is considered an integral part of a complete examination. Limited examinations for reoccurring indications may be performed as noted. The reflux portion of the exam is performed with the patient in reverse Trendelenburg.  +---------+---------------+---------+-----------+----------+--------------+ RIGHT    CompressibilityPhasicitySpontaneityPropertiesThrombus Aging +---------+---------------+---------+-----------+----------+--------------+ CFV      Full           Yes      Yes                                 +---------+---------------+---------+-----------+----------+--------------+ SFJ      Full                                                        +---------+---------------+---------+-----------+----------+--------------+  FV Prox  Full                                                        +---------+---------------+---------+-----------+----------+--------------+ FV Mid   Full                                                        +---------+---------------+---------+-----------+----------+--------------+ FV DistalFull                                                        +---------+---------------+---------+-----------+----------+--------------+ PFV      Full                                                        +---------+---------------+---------+-----------+----------+--------------+ POP      Full           Yes      Yes                                 +---------+---------------+---------+-----------+----------+--------------+ PTV      Full                                                        +---------+---------------+---------+-----------+----------+--------------+ PERO     Full                                                        +---------+---------------+---------+-----------+----------+--------------+   +---------+---------------+---------+-----------+----------+--------------+  LEFT     CompressibilityPhasicitySpontaneityPropertiesThrombus Aging +---------+---------------+---------+-----------+----------+--------------+ CFV      Full           Yes      Yes                                 +---------+---------------+---------+-----------+----------+--------------+ SFJ      Full                                                        +---------+---------------+---------+-----------+----------+--------------+ FV Prox  Full                                                        +---------+---------------+---------+-----------+----------+--------------+  FV Mid   Full                                                        +---------+---------------+---------+-----------+----------+--------------+ FV DistalFull                                                        +---------+---------------+---------+-----------+----------+--------------+ PFV      Full                                                        +---------+---------------+---------+-----------+----------+--------------+ POP      Full           Yes      Yes                                 +---------+---------------+---------+-----------+----------+--------------+ PTV      Full                                                        +---------+---------------+---------+-----------+----------+--------------+ PERO     Full                                                        +---------+---------------+---------+-----------+----------+--------------+     Summary: RIGHT: - There is no evidence of deep vein thrombosis in the lower extremity.  - No cystic structure found in the popliteal fossa.  LEFT: - There is no evidence of deep vein thrombosis in the lower extremity.  - No cystic structure found in the popliteal fossa.  *See table(s) above for measurements and observations. Electronically signed by Servando Snare MD on 02/09/2020 at 12:43:19 PM.    Final        Subjective: Wants to go home.   Discharge Exam: Vitals:   02/13/20 0015 02/13/20 0602  BP: 123/81 129/86  Pulse: (!) 108 (!) 107  Resp: 18 18  Temp: 98.8 F (37.1 C) 98.1 F (36.7 C)  SpO2: 95% 93%   Vitals:   02/12/20 2030 02/12/20 2122 02/13/20 0015 02/13/20 0602  BP: 121/86 126/88 123/81 129/86  Pulse: (!) 121 (!) 105 (!) 108 (!) 107  Resp: 18 20 18 18   Temp: 98.8 F (37.1 C) 98.3 F (36.8 C) 98.8 F (37.1 C) 98.1 F (36.7 C)  TempSrc:  Oral Oral Oral  SpO2: 100% 97% 95% 93%  Weight:      Height:        General: Pt is alert, awake, not in acute distress Cardiovascular: RRR, S1/S2 +, no rubs, no gallops Respiratory: CTA bilaterally, no wheezing, no rhonchi Abdominal: Soft, NT, ND,  bowel sounds + Extremities:  Leg edema, no cyanosis    The results of significant diagnostics from this hospitalization (including imaging, microbiology, ancillary and laboratory) are listed below for reference.     Microbiology: Recent Results (from the past 240 hour(s))  Culture, blood (Routine x 2)     Status: None (Preliminary result)   Collection Time: 02/08/20  2:25 PM   Specimen: Left Antecubital; Blood  Result Value Ref Range Status   Specimen Description   Final    LEFT ANTECUBITAL Performed at [facility], [address] [place], [place] [address]    Special Requests   Final    BOTTLES DRAWN AEROBIC AND ANAEROBIC Blood Culture adequate volume Performed at [facility], [address] [place], [place] [address]    Culture   Final    NO GROWTH 4 DAYS Performed at [facility], [place] [address], [place], [place] [address]    Report Status PENDING  Incomplete  Resp Panel by RT-PCR (Flu A&B, Covid) Nasopharyngeal Swab     Status: None   Collection Time: 02/08/20  2:43 PM   Specimen: Nasopharyngeal Swab; Nasopharyngeal(NP) swabs in vial transport medium  Result Value Ref Range Status   SARS Coronavirus 2 by RT PCR NEGATIVE NEGATIVE  Final    Comment: (NOTE) SARS-CoV-2 target nucleic acids are NOT DETECTED.  The SARS-CoV-2 RNA is generally detectable in upper respiratory specimens during the acute phase of infection. The lowest concentration of SARS-CoV-2 viral copies this assay can detect is 138 copies/mL. A negative result does not preclude SARS-Cov-2 infection and should not be used as the sole basis for treatment or other patient management decisions. A negative result may occur with  improper specimen collection/handling, submission of specimen other than nasopharyngeal swab, presence of viral mutation(s) within the areas targeted by this assay, and inadequate number of viral copies(<138 copies/mL). A negative result must be combined with clinical observations, patient history, and epidemiological information. The expected result is Negative.  Fact Sheet for Patients:  EntrepreneurPulse.com.au  Fact Sheet for Healthcare Providers:  IncredibleEmployment.be  This test is no t yet approved or cleared by the [place] FDA and  has been authorized for detection and/or diagnosis of SARS-CoV-2 by FDA under an Emergency Use Authorization (EUA). This EUA will remain  in effect (meaning this test can be used) for the duration of the COVID-19 declaration under Section 564(b)(1) of the Act, 21 U.S.C.section 360bbb-3(b)(1), unless the authorization is terminated  or revoked sooner.       Influenza A by PCR NEGATIVE NEGATIVE Final   Influenza B by PCR NEGATIVE NEGATIVE Final    Comment: (NOTE) The Xpert Xpress SARS-CoV-2/FLU/RSV plus assay is intended as an aid in the diagnosis of influenza from Nasopharyngeal swab specimens and should not be used as a sole basis for treatment. Nasal washings and aspirates are unacceptable for Xpert Xpress SARS-CoV-2/FLU/RSV testing.  Fact Sheet for Patients: EntrepreneurPulse.com.au  Fact Sheet for Healthcare  Providers: IncredibleEmployment.be  This test is not yet approved or cleared by the [place] FDA and has been authorized for detection and/or diagnosis of SARS-CoV-2 by FDA under an Emergency Use Authorization (EUA). This EUA will remain in effect (meaning this test can be used) for the duration of the COVID-19 declaration under Section 564(b)(1) of the Act, 21 U.S.C. section 360bbb-3(b)(1), unless the authorization is terminated or revoked.  Performed at [facility], [address] [place], [place] [address]  Culture, blood (Routine x 2)     Status: None (Preliminary result)   Collection Time: 02/08/20  3:50 PM   Specimen: Porta Cath; Blood  Result Value Ref Range Status   Specimen Description   Final    PORTA CATH Performed at [facility], [address] [place], [place] [address]    Special Requests   Final    BOTTLES DRAWN AEROBIC AND ANAEROBIC Blood Culture adequate volume Performed at [facility], [address] [place], [place] [address]    Culture   Final    NO GROWTH 4 DAYS Performed at [facility], [place] [address], [place], [place] [address]    Report Status PENDING  Incomplete  Urine culture     Status: None   Collection Time: 02/08/20  5:10 PM   Specimen: In/Out Cath Urine  Result Value Ref Range Status   Specimen Description   Final    IN/OUT CATH URINE Performed at [facility], [address] [place], [place] [address]    Special Requests   Final    NONE Performed at [facility], [address] [place], [place] [address]    Culture   Final    NO GROWTH Performed at [facility], [place] [address] [place], [place] [address]    Report Status 02/10/2020 FINAL  Final  Urine Culture     Status: None   Collection Time: 02/09/20  8:13 AM   Specimen: Urine, Clean Catch  Result Value Ref Range Status   Specimen Description   Final    URINE, CLEAN  CATCH Performed at [facility], [place] [address], [place], [place] [address]    Special Requests   Final    NONE Performed at [facility], [place] [address] [place], [place] [address]    Culture   Final    NO GROWTH Performed at [facility], [place] [address], [place], [place] [address]    Report Status 02/10/2020 FINAL  Final  Expectorated sputum assessment w rflx to resp cult     Status: None   Collection Time: 02/10/20  1:06 PM   Specimen: Sputum  Result Value Ref Range Status   Specimen Description SPUTUM  Final   Special Requests NONE  Final   Sputum evaluation   Final    THIS SPECIMEN IS ACCEPTABLE FOR SPUTUM CULTURE Performed at [facility], [place] [address] [place], [place] [address]    Report Status 02/10/2020 FINAL  Final  Culture, respiratory     Status: None   Collection Time: 02/10/20  1:06 PM   Specimen: SPU  Result Value Ref Range Status   Specimen Description   Final    SPUTUM Performed at [place] [address], [place], [place] [address]    Special Requests   Final    NONE Reflexed from [id number] Performed at [facility], [place] [address], [place], [place] [address]    Gram Stain   Final    FEW WBC PRESENT,BOTH PMN AND MONONUCLEAR NO ORGANISMS SEEN    Culture   Final    RARE Normal respiratory flora-no Staph aureus or Pseudomonas seen Performed at [facility], [address] [place], [place] [address]    Report Status 02/12/2020 FINAL  Final  MRSA PCR Screening     Status: None   Collection Time: 02/11/20  10:03 AM   Specimen: Nasal Mucosa; Nasopharyngeal  Result Value Ref Range Status   MRSA by PCR NEGATIVE NEGATIVE Final    Comment:        The GeneXpert MRSA Assay (FDA approved for NASAL specimens only), is one component of a comprehensive MRSA colonization surveillance program. It is not intended to diagnose MRSA infection nor to guide  or monitor treatment for MRSA infections. Performed at [facility], [place] [address], [place], [place] [address]      Labs: BNP (last 3 results) No results for input(s): BNP in the last 8760 hours. Basic Metabolic Panel: Recent Labs  Lab 02/09/20 0908 02/10/20 0357 02/11/20 0335 02/12/20 0309 02/13/20 0401  NA 136 134* 135 137 139  K 2.9* 3.0* 3.4* 3.1* 3.4*  CL 94* 96* 98 101 100  CO2 32 27 26 26 29   GLUCOSE 113* 118* 146* 120* 115*  BUN 29* 29* 26* 20 21*  CREATININE 1.82* 1.62* 1.47* 1.26* 1.13  CALCIUM 8.4* 7.9* 7.8* 7.8* 8.6*  MG 1.4* 1.9 1.7  --   --    Liver Function Tests: Recent Labs  Lab 02/08/20 1425 02/09/20 0908 02/11/20 0335 02/12/20 0309 02/13/20 0401  AST 33 25 21 39 56*  ALT 32 22 19 24  40  ALKPHOS 76 68 61 56 72  BILITOT 1.0 1.1 0.7 0.4 0.8  PROT 6.3* 5.5* 5.1* 5.0* 5.8*  ALBUMIN 3.8 3.2* 2.8* 2.7* 3.1*   No results for input(s): LIPASE, AMYLASE in the last 168 hours. No results for input(s): AMMONIA in the last 168 hours. CBC: Recent Labs  Lab 02/08/20 1330 02/08/20 1425 02/09/20 0908 02/10/20 0357 02/11/20 0330 02/12/20 0309 02/13/20 0401  WBC 4.9 4.4 3.9* 2.6* 2.0* 1.5* 3.8*  NEUTROABS 3.2 3.1  --   --  1.2* 0.7* 2.5  HGB 10.1* 10.5* 9.1* 7.9* 8.0* 7.7* 10.5*  HCT 31.3* 32.6* 28.2* 24.5* 25.4* 25.0* 33.3*  MCV 102.0* 101.6* 102.9* 102.5* 102.4* 103.3* 98.5  PLT 202 212 155 142* 162 183 231   Cardiac Enzymes: No results for input(s): CKTOTAL, CKMB, CKMBINDEX, TROPONINI in the last 168 hours. BNP: Invalid input(s): POCBNP CBG: No results for input(s): GLUCAP in the last 168 hours. D-Dimer No results for input(s): DDIMER in the last 72 hours. Hgb A1c No results for input(s): HGBA1C in the last 72 hours. Lipid Profile No results for input(s): CHOL, HDL, LDLCALC, TRIG, CHOLHDL, LDLDIRECT in the last 72 hours. Thyroid function studies No results for input(s): TSH, T4TOTAL, T3FREE, THYROIDAB in the last 72  hours.  Invalid input(s): FREET3 Anemia work up No results for input(s): VITAMINB12, FOLATE, FERRITIN, TIBC, IRON, RETICCTPCT in the last 72 hours. Urinalysis    Component Value Date/Time   COLORURINE YELLOW 02/08/2020 1710   APPEARANCEUR CLEAR 02/08/2020 1710   LABSPEC 1.025 02/08/2020 1710   PHURINE 5.0 02/08/2020 1710   GLUCOSEU NEGATIVE 02/08/2020 1710   HGBUR NEGATIVE 02/08/2020 1710   BILIRUBINUR NEGATIVE 02/08/2020 1710   BILIRUBINUR negative 06/11/2013 1309   KETONESUR NEGATIVE 02/08/2020 1710   PROTEINUR NEGATIVE 02/08/2020 1710   UROBILINOGEN 0.2 06/12/2013 0149   NITRITE NEGATIVE 02/08/2020 1710   LEUKOCYTESUR NEGATIVE 02/08/2020 1710   Sepsis Labs Invalid input(s): PROCALCITONIN,  WBC,  LACTICIDVEN Microbiology Recent Results (from the past 240 hour(s))  Culture, blood (Routine x 2)     Status: None (Preliminary result)   Collection Time: 02/08/20  2:25 PM   Specimen: Left Antecubital; Blood  Result Value Ref Range Status  Specimen Description   Final    LEFT ANTECUBITAL Performed at [facility], [address] [place], [place] [address]    Special Requests   Final    BOTTLES DRAWN AEROBIC AND ANAEROBIC Blood Culture adequate volume Performed at [facility], [address] [place], [place] [address]    Culture   Final    NO GROWTH 4 DAYS Performed at [facility], [place] [address], [place], [place] [address]    Report Status PENDING  Incomplete  Resp Panel by RT-PCR (Flu A&B, Covid) Nasopharyngeal Swab     Status: None   Collection Time: 02/08/20  2:43 PM   Specimen: Nasopharyngeal Swab; Nasopharyngeal(NP) swabs in vial transport medium  Result Value Ref Range Status   SARS Coronavirus 2 by RT PCR NEGATIVE NEGATIVE Final    Comment: (NOTE) SARS-CoV-2 target nucleic acids are NOT DETECTED.  The SARS-CoV-2 RNA is generally detectable in upper respiratory specimens during the acute phase of infection. The  lowest concentration of SARS-CoV-2 viral copies this assay can detect is 138 copies/mL. A negative result does not preclude SARS-Cov-2 infection and should not be used as the sole basis for treatment or other patient management decisions. A negative result may occur with  improper specimen collection/handling, submission of specimen other than nasopharyngeal swab, presence of viral mutation(s) within the areas targeted by this assay, and inadequate number of viral copies(<138 copies/mL). A negative result must be combined with clinical observations, patient history, and epidemiological information. The expected result is Negative.  Fact Sheet for Patients:  EntrepreneurPulse.com.au  Fact Sheet for Healthcare Providers:  IncredibleEmployment.be  This test is no t yet approved or cleared by the [place] FDA and  has been authorized for detection and/or diagnosis of SARS-CoV-2 by FDA under an Emergency Use Authorization (EUA). This EUA will remain  in effect (meaning this test can be used) for the duration of the COVID-19 declaration under Section 564(b)(1) of the Act, 21 U.S.C.section 360bbb-3(b)(1), unless the authorization is terminated  or revoked sooner.       Influenza A by PCR NEGATIVE NEGATIVE Final   Influenza B by PCR NEGATIVE NEGATIVE Final    Comment: (NOTE) The Xpert Xpress SARS-CoV-2/FLU/RSV plus assay is intended as an aid in the diagnosis of influenza from Nasopharyngeal swab specimens and should not be used as a sole basis for treatment. Nasal washings and aspirates are unacceptable for Xpert Xpress SARS-CoV-2/FLU/RSV testing.  Fact Sheet for Patients: EntrepreneurPulse.com.au  Fact Sheet for Healthcare Providers: IncredibleEmployment.be  This test is not yet approved or cleared by the [place] FDA and has been authorized for detection and/or diagnosis of SARS-CoV-2 by FDA under  an Emergency Use Authorization (EUA). This EUA will remain in effect (meaning this test can be used) for the duration of the COVID-19 declaration under Section 564(b)(1) of the Act, 21 U.S.C. section 360bbb-3(b)(1), unless the authorization is terminated or revoked.  Performed at [facility], [address] [place], [place] [address]   Culture, blood (Routine x 2)     Status: None (Preliminary result)   Collection Time: 02/08/20  3:50 PM   Specimen: Porta Cath; Blood  Result Value Ref Range Status   Specimen Description   Final    PORTA CATH Performed at [facility], [address] [place], [place] [address]    Special Requests   Final    BOTTLES DRAWN AEROBIC AND ANAEROBIC Blood Culture adequate  volume Performed at [facility], [address] [place], [place] [address]    Culture   Final    NO GROWTH 4 DAYS Performed at [facility], [place] [address], [place], [place] [address]    Report Status PENDING  Incomplete  Urine culture     Status: None   Collection Time: 02/08/20  5:10 PM   Specimen: In/Out Cath Urine  Result Value Ref Range Status   Specimen Description   Final    IN/OUT CATH URINE Performed at [facility], [address] [place], [place] [address]    Special Requests   Final    NONE Performed at [facility], [address] [place], [place] [address]    Culture   Final    NO GROWTH Performed at [facility], [place] [address], [place], [place] [address]    Report Status 02/10/2020 FINAL  Final  Urine Culture     Status: None   Collection Time: 02/09/20  8:13 AM   Specimen: Urine, Clean Catch  Result Value Ref Range Status   Specimen Description   Final    URINE, CLEAN CATCH Performed at [facility], [place] [address] [place], [place] [address]    Special Requests   Final    NONE Performed at [facility], [place] [address] [place], [place]  [address]    Culture   Final    NO GROWTH Performed at [facility], [place] [address] [place], [place] [address]    Report Status 02/10/2020 FINAL  Final  Expectorated sputum assessment w rflx to resp cult     Status: None   Collection Time: 02/10/20  1:06 PM   Specimen: Sputum  Result Value Ref Range Status   Specimen Description SPUTUM  Final   Special Requests NONE  Final   Sputum evaluation   Final    THIS SPECIMEN IS ACCEPTABLE FOR SPUTUM CULTURE Performed at [facility], [place] [address], [place], [place] [address]    Report Status 02/10/2020 FINAL  Final  Culture, respiratory     Status: None   Collection Time: 02/10/20  1:06 PM   Specimen: SPU  Result Value Ref Range Status   Specimen Description   Final    SPUTUM Performed at [place] [address], [place], [place] [address]    Special Requests   Final    NONE Reflexed from [id number] Performed at [facility], [place] [address], [place], [place] [address]    Gram Stain   Final    FEW WBC PRESENT,BOTH PMN AND MONONUCLEAR NO ORGANISMS SEEN    Culture   Final    RARE Normal respiratory flora-no Staph aureus or Pseudomonas seen Performed at [facility], [address] [place], [place] [address]    Report Status 02/12/2020 FINAL  Final  MRSA PCR Screening     Status: None   Collection Time: 02/11/20 10:03 AM   Specimen: Nasal Mucosa; Nasopharyngeal  Result Value Ref Range Status   MRSA by PCR NEGATIVE NEGATIVE Final    Comment:        The GeneXpert MRSA Assay (FDA approved for NASAL specimens only), is one component of a comprehensive MRSA colonization surveillance program. It is not intended to diagnose MRSA infection nor to guide or monitor treatment for MRSA infections. Performed at [facility], [place]  [address], [place], [place] [address]      Time coordinating discharge:32 minutes  SIGNED:   Hosie Poisson,  MD  [facility] 02/13/2020, 10:11 AM

## 2020-02-13 NOTE — Plan of Care (Signed)

## 2020-02-14 ENCOUNTER — Other Ambulatory Visit: Payer: Self-pay | Admitting: *Deleted

## 2020-02-14 NOTE — Patient Outreach (Signed)
[facility]) Care Management  02/14/2020  KEEVIN PANEBIANCO December 30, 1975 [id number]  Transition of care call/case closure   Referral received:02/12/20 Initial outreach:02/14/20 Insurance: [place] [employer]    Subjective: Initial successful telephone call to patient's preferred number in order to complete transition of care assessment; 2 HIPAA identifiers verified. Explained purpose of call and completed transition of care assessment.  Johne states that he is doing better glad to be back at home with family.He  denies pain , fever ,shortness of breath increase in swelling on today. He .tolerating diet,denies difficulty with swallowing or sore mouth, denies bowel or bladder problems.Discussed worsening symptoms to notify MD of sooner, fever, increased shortness of breath, fast heart beat, dizziness.  Spouse/family  are assisting with his recovery.   Reviewed accessing the following [place] Benefits : .  He does have  the hospital indemnity plan and has contact number to file claim if needed.  He  uses a [facility] at [facility]   He denies educational needs related to staying safe during the [place] 19 pandemic.    Objective:  [name]  hospitalized at [facility] for Sepsis, Acute kidney injury,Pancytopenia, 2 units PRBC's.  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 Pulmonary embolism, DVT left popliteal vein.  He was discharged to home on 02/13/20 without the need for home health services or DME.   Assessment:  Patient voices good understanding of all discharge instructions.  See transition of care flowsheet for assessment details.   Plan:  Reviewed hospital discharge diagnosis of Sepsis, fever, pancytopenia,   and discharge treatment plan using hospital discharge instructions, assessing medication adherence, reviewing problems requiring provider  notification, and discussing the importance of follow up with surgeon, primary care provider and/or specialists as directed.  Reviewed [place] healthy lifestyle program information to receive discounted premium for  2023  Step 1: Get  your annual physical  Step 2: Complete your health assessment  Step 3:Identify your current health status and complete the corresponding action step between January 1, and October 17, 2020.     No ongoing care management needs identified so will close case to [facility] [facility] services and route successful outreach letter with [place] Management pamphlet and 24 Hour Nurse Line Magnet to [facility] clinical pool to be mailed to patient's home address.   Joylene Draft, RN, BSN  [facility] Management Coordinator  [phone]- Mobile [phone]- Toll Free Main Office

## 2020-02-19 ENCOUNTER — Other Ambulatory Visit: Payer: Self-pay | Admitting: Family

## 2020-02-19 ENCOUNTER — Encounter: Payer: Self-pay | Admitting: *Deleted

## 2020-02-21 ENCOUNTER — Other Ambulatory Visit: Payer: Self-pay | Admitting: Hematology & Oncology

## 2020-02-21 ENCOUNTER — Other Ambulatory Visit (HOSPITAL_BASED_OUTPATIENT_CLINIC_OR_DEPARTMENT_OTHER): Payer: Self-pay | Admitting: Hematology & Oncology

## 2020-02-21 DIAGNOSIS — C73 Malignant neoplasm of thyroid gland: Secondary | ICD-10-CM

## 2020-02-22 ENCOUNTER — Inpatient Hospital Stay: Payer: 59

## 2020-02-22 ENCOUNTER — Inpatient Hospital Stay (HOSPITAL_BASED_OUTPATIENT_CLINIC_OR_DEPARTMENT_OTHER): Payer: 59 | Admitting: Family

## 2020-02-22 ENCOUNTER — Other Ambulatory Visit: Payer: Self-pay | Admitting: Family

## 2020-02-22 ENCOUNTER — Inpatient Hospital Stay: Payer: 59 | Attending: Hematology & Oncology

## 2020-02-22 ENCOUNTER — Other Ambulatory Visit: Payer: Self-pay

## 2020-02-22 VITALS — HR 96

## 2020-02-22 DIAGNOSIS — C73 Malignant neoplasm of thyroid gland: Secondary | ICD-10-CM | POA: Diagnosis not present

## 2020-02-22 DIAGNOSIS — Z79899 Other long term (current) drug therapy: Secondary | ICD-10-CM | POA: Diagnosis not present

## 2020-02-22 DIAGNOSIS — Z5111 Encounter for antineoplastic chemotherapy: Secondary | ICD-10-CM | POA: Diagnosis not present

## 2020-02-22 DIAGNOSIS — D509 Iron deficiency anemia, unspecified: Secondary | ICD-10-CM

## 2020-02-22 DIAGNOSIS — C787 Secondary malignant neoplasm of liver and intrahepatic bile duct: Secondary | ICD-10-CM | POA: Insufficient documentation

## 2020-02-22 LAB — CBC WITH DIFFERENTIAL (CANCER CENTER ONLY)
Abs Immature Granulocytes: 0.03 10*3/uL (ref 0.00–0.07)
Basophils Absolute: 0 10*3/uL (ref 0.0–0.1)
Basophils Relative: 1 %
Eosinophils Absolute: 0.1 10*3/uL (ref 0.0–0.5)
Eosinophils Relative: 1 %
HCT: 32.4 % — ABNORMAL LOW (ref 39.0–52.0)
Hemoglobin: 10.1 g/dL — ABNORMAL LOW (ref 13.0–17.0)
Immature Granulocytes: 1 %
Lymphocytes Relative: 31 %
Lymphs Abs: 1.2 10*3/uL (ref 0.7–4.0)
MCH: 29.7 pg (ref 26.0–34.0)
MCHC: 31.2 g/dL (ref 30.0–36.0)
MCV: 95.3 fL (ref 80.0–100.0)
Monocytes Absolute: 0.6 10*3/uL (ref 0.1–1.0)
Monocytes Relative: 15 %
Neutro Abs: 2.1 10*3/uL (ref 1.7–7.7)
Neutrophils Relative %: 51 %
Platelet Count: 298 10*3/uL (ref 150–400)
RBC: 3.4 MIL/uL — ABNORMAL LOW (ref 4.22–5.81)
RDW: 16.3 % — ABNORMAL HIGH (ref 11.5–15.5)
WBC Count: 4 10*3/uL (ref 4.0–10.5)
nRBC: 0 % (ref 0.0–0.2)

## 2020-02-22 LAB — CMP (CANCER CENTER ONLY)
ALT: 18 U/L (ref 0–44)
AST: 23 U/L (ref 15–41)
Albumin: 3.8 g/dL (ref 3.5–5.0)
Alkaline Phosphatase: 76 U/L (ref 38–126)
Anion gap: 9 (ref 5–15)
BUN: 27 mg/dL — ABNORMAL HIGH (ref 6–20)
CO2: 29 mmol/L (ref 22–32)
Calcium: 10 mg/dL (ref 8.9–10.3)
Chloride: 103 mmol/L (ref 98–111)
Creatinine: 1.57 mg/dL — ABNORMAL HIGH (ref 0.61–1.24)
GFR, Estimated: 55 mL/min — ABNORMAL LOW (ref >60)
Glucose, Bld: 99 mg/dL (ref 70–99)
Potassium: 3.8 mmol/L (ref 3.5–5.1)
Sodium: 141 mmol/L (ref 135–145)
Total Bilirubin: 0.4 mg/dL (ref 0.3–1.2)
Total Protein: 5.9 g/dL — ABNORMAL LOW (ref 6.5–8.1)

## 2020-02-22 LAB — LACTATE DEHYDROGENASE: LDH: 265 U/L — ABNORMAL HIGH (ref 98–192)

## 2020-02-22 MED ORDER — DOXORUBICIN HCL CHEMO IV INJECTION 2 MG/ML
25.0000 mg/m2 | Freq: Once | INTRAVENOUS | Status: AC
  Administered 2020-02-22: 62 mg via INTRAVENOUS
  Filled 2020-02-22: qty 31

## 2020-02-22 MED ORDER — PALONOSETRON HCL INJECTION 0.25 MG/5ML
0.2500 mg | Freq: Once | INTRAVENOUS | Status: AC
  Administered 2020-02-22: 0.25 mg via INTRAVENOUS

## 2020-02-22 MED ORDER — SODIUM CHLORIDE 0.9 % IV SOLN
Freq: Once | INTRAVENOUS | Status: AC
  Filled 2020-02-22: qty 250

## 2020-02-22 MED ORDER — HEPARIN SOD (PORK) LOCK FLUSH 100 UNIT/ML IV SOLN
500.0000 [IU] | Freq: Once | INTRAVENOUS | Status: AC | PRN
Start: 2020-02-22 — End: 2020-02-22
  Administered 2020-02-22: 500 [IU]
  Filled 2020-02-22: qty 5

## 2020-02-22 MED ORDER — PALONOSETRON HCL INJECTION 0.25 MG/5ML
INTRAVENOUS | Status: AC
  Filled 2020-02-22: qty 5

## 2020-02-22 MED ORDER — SODIUM CHLORIDE 0.9% FLUSH
10.0000 mL | INTRAVENOUS | Status: DC | PRN
  Administered 2020-02-22: 10 mL
  Filled 2020-02-22: qty 10

## 2020-02-22 MED ORDER — SODIUM CHLORIDE 0.9 % IV SOLN
10.0000 mg | Freq: Once | INTRAVENOUS | Status: AC
  Administered 2020-02-22: 10 mg via INTRAVENOUS
  Filled 2020-02-22: qty 10

## 2020-02-22 NOTE — Progress Notes (Signed)
Hematology and Oncology Follow Up Visit  Brian [name] [id number] 1975/05/28 45 y.o. 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 is here today for post hospitalization follow-up and to resume treatment. He looks and feels much better. He denies fatigue and has been resting well.  He notes that he has been a bit forgetful at times.  No chills, n/v, cough, rash, dizziness, chest pain, palpitations, abdominal pain or changes in bowel or bladder habits.  He has minimal SOB with exertion and will take a break when needed.  His Synthroid was reduced from 288 to 275 mcg at discharge. We will recheck his TSH and T4 in a few a few weeks.  The swelling in his lower extremities appears to be improved. Pedal pulses are 2+.  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  No falls or syncope to report.  He has a good appetite but admits that he could probably hydrate a little more throughout the day.   ECOG Performance Status: 1 - Symptomatic but completely ambulatory  Medications:  Allergies as of 02/22/2020      Reactions   Dextromethorphan-guaifenesin Other (See Comments)   Irregular heartbeat   Doxycycline Anaphylaxis, Nausea And Vomiting, Rash, Other (See Comments)   "heart arrythmia" and "dyspepsia" (only oral doxycycline causes reaction)   Gadobutrol Hives, Other (See Comments)   Patient had MRI scan at [place]. Patient called one hour after he left imaging facility to report two  "blisters" that came up on "back" of lip.    Gadolinium Derivatives Hives   Patient had MRI scan at [place]. Patient called one hour after he left imaging facility to report two "blisters" that came up on "back" of lip.    Guaifenesin Palpitations      Ibuprofen Hives, Itching   Lisinopril Other (See Comments)   Angioedema   Pantoprazole Itching   Tramadol Hives, Itching   Barium Rash   Developed redness around neck after drinking 1st bottle of Barocat; pt was given Benedryl by ED Mds to be "on the safe side" before drinking 2nd bottle   Chlorhexidine Hives      Medication List       Accurate as of February 22, 2020 12:06 PM. If you have any questions, ask your nurse or doctor.        apixaban 5 MG Tabs tablet Commonly known as: ELIQUIS Take 1 tablet (5 mg total) by mouth 2 (two) times daily.   cloNIDine 0.1 MG tablet Commonly known as: CATAPRES Take 0.1 mg by mouth 2 (two) times daily as needed (high blood pressure).   EPINEPHrine 0.3 mg/0.3 mL Soaj injection Commonly known as: EpiPen 2-Pak USE AS DIRECTED FOR LIFE THREATENING ALLERGIC REACTIONS What changed:   how much to take  how to take this  when to take this  reasons to take this  additional instructions   fluconazole 100 MG tablet Commonly known as: DIFLUCAN Take 1 tablet (100 mg total) by  mouth daily.   furosemide 80 MG tablet Commonly known as: LASIX TAKE 1 TABLET (80 MG TOTAL) BY MOUTH DAILY. What changed:   when to take this  reasons to take this   gabapentin 300 MG capsule Commonly known as: NEURONTIN TAKE 1 CAPSULE (300 MG TOTAL) BY MOUTH 3 (THREE) TIMES DAILY.   levothyroxine 200 MCG tablet Commonly known as: SYNTHROID Take 1 tablet (200 mcg total) by mouth daily. Take along with 88 mcg=288 mcg   levothyroxine 75 MCG tablet Commonly known as: SYNTHROID Take 1 tablet (75 mcg total) by mouth daily before breakfast.   lidocaine-prilocaine cream Commonly known as: EMLA Apply  to affected area once   LORazepam 0.5 MG tablet Commonly known as: ATIVAN TAKE 1 TABLET (0.5 MG TOTAL) BY MOUTH EVERY 6 (SIX) HOURS AS NEEDED (NAUSEA OR VOMITING).   losartan 25 MG tablet Commonly known as: COZAAR Take 1 tablet (25 mg total) by mouth 2 (two) times daily. What changed:   when to take this  reasons to take this   magic mouthwash Soln Take 10 mLs by mouth 4 (four) times daily as needed for mouth pain.   methylphenidate 10 MG tablet Commonly known as: RITALIN Take 1 tablet (10 mg total) by mouth 2 (two) times daily. What changed: when to take this   metoCLOPramide 10 MG tablet Commonly known as: REGLAN TAKE 1 TABLET BY MOUTH FOUR TIMES DAILY BEFORE MEALS AND AT BEDTIME What changed: See the new instructions.   metolazone 5 MG tablet Commonly known as: ZAROXOLYN TAKE 1 TABLET (5 MG TOTAL) BY MOUTH DAILY. TAKE 1 HOUR BEFORE LASIX. What changed:   when to take this  reasons to take this  additional instructions   multivitamin capsule Take 1 capsule by mouth daily.   nystatin 100000 UNIT/ML suspension Commonly known as: MYCOSTATIN Take 5 mLs (500,000 Units total) by mouth 4 (four) times daily.   OLANZapine 10 MG tablet Commonly known as: ZYPREXA Take 10 mg by mouth at bedtime.   oxyCODONE 20 mg 12 hr tablet Commonly known as: OXYCONTIN Take 1 tablet (20 mg total) by mouth every 12 (twelve) hours. What changed: Another medication with the same name was changed. Make sure you understand how and when to take each.   Oxycodone HCl 10 MG Tabs Take 1 tablet (10 mg total) by mouth every 6 (six) hours as needed. What changed: reasons to take this   potassium chloride 10 MEQ tablet Commonly known as: KLOR-CON TAKE 1 TABLET (10 MEQ TOTAL) BY MOUTH DAILY.   prochlorperazine 10 MG tablet Commonly known as: COMPAZINE Take 1 tablet (10 mg total) by mouth every 6 (six) hours as needed (Nausea or vomiting).   sucralfate 1 GM/10ML suspension Commonly known  as: Carafate Take 10 mLs (1 g total) by mouth 4 (four) times daily -  with meals and at bedtime. What changed:   when to take this  reasons to take this       Allergies:  Allergies  Allergen Reactions  . Dextromethorphan-Guaifenesin Other (See Comments)    Irregular heartbeat   . Doxycycline Anaphylaxis, Nausea And Vomiting, Rash and Other (See Comments)    "heart arrythmia" and "dyspepsia" (only oral doxycycline causes reaction)  . Gadobutrol Hives and Other (See Comments)    Patient had MRI scan at [place]. Patient called one hour after he left imaging facility to report two "blisters" that came up on "back" of lip.    . Gadolinium Derivatives Hives  Patient had MRI scan at [place]. Patient called one hour after he left imaging facility to report two "blisters" that came up on "back" of lip.   . Guaifenesin Palpitations       . Ibuprofen Hives and Itching  . Lisinopril Other (See Comments)    Angioedema  . Pantoprazole Itching  . Tramadol Hives and Itching  . Barium Rash    Developed redness around neck after drinking 1st bottle of Barocat; pt was given Benedryl by ED Mds to be "on the safe side" before drinking 2nd bottle  . Chlorhexidine Hives    Past Medical History, Surgical history, Social history, and Family History were reviewed and updated.  Review of Systems: All other 10 point review of systems is negative.   Physical Exam:  vitals were not taken for this visit.   Wt Readings from Last 3 Encounters:  02/08/20 284 lb 6.3 oz (129 kg)  01/25/20 286 lb (129.7 kg)  01/23/20 285 lb 3.2 oz (129.4 kg)    Ocular: Sclerae unicteric, pupils equal, round and reactive to light Ear-nose-throat: Oropharynx clear, dentition fair Lymphatic: No cervical or supraclavicular adenopathy Lungs no rales or rhonchi, good excursion bilaterally Heart regular rate and rhythm, no murmur appreciated Abd soft, nontender, positive bowel sounds MSK no focal  spinal tenderness, no joint edema Neuro: non-focal, well-oriented, appropriate affect Breasts: Deferred   Lab Results  Component Value Date   WBC 4.0 02/22/2020   HGB 10.1 (L) 02/22/2020   HCT 32.4 (L) 02/22/2020   MCV 95.3 02/22/2020   PLT 298 02/22/2020   Lab Results  Component Value Date   FERRITIN 370 (H) 01/18/2020   IRON 64 01/18/2020   TIBC 303 01/18/2020   UIBC 239 01/18/2020   IRONPCTSAT 21 01/18/2020   Lab Results  Component Value Date   RETICCTPCT 5.3 (H) 01/18/2020   RBC 3.40 (L) 02/22/2020   No results found for: KPAFRELGTCHN, LAMBDASER, KAPLAMBRATIO No results found for: IGGSERUM, IGA, IGMSERUM No results found for: Odetta Pink, SPEI   Chemistry      Component Value Date/Time   NA 141 02/22/2020 1135   NA 141 02/06/2019 1210   K 3.8 02/22/2020 1135   CL 103 02/22/2020 1135   CO2 29 02/22/2020 1135   BUN 27 (H) 02/22/2020 1135   BUN 8 02/06/2019 1210   CREATININE 1.57 (H) 02/22/2020 1135      Component Value Date/Time   CALCIUM 10.0 02/22/2020 1135   ALKPHOS 76 02/22/2020 1135   AST 23 02/22/2020 1135   ALT 18 02/22/2020 1135   BILITOT 0.4 02/22/2020 1135       Impression and Plan: 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  He was hospitalized 12/23-12/28 with sepsis. He was treated with IV antibiotics and received blood during admission. He is feeling much better today.  His counts have improved and we will proceed with treatment today as planned. Per Dr. Marin Olp.  Follow-up in 1 weeks.  He will contact our office with any questions or concerns. We can certainly see him sooner if needed.   Laverna Peace, NP 1/6/202212:06 PM

## 2020-02-22 NOTE — Patient Instructions (Signed)
Pt discharged in no apparent distress. Pt left ambulatory without assistance. Pt aware of discharge instructions and verbalized understanding and had no further questions.Doxorubicin injection What is this medicine? DOXORUBICIN (dox oh ROO bi sin) is a chemotherapy drug. It is used to treat many kinds of cancer like leukemia, lymphoma, neuroblastoma, sarcoma, and Wilms' tumor. It is also used to treat bladder cancer, breast cancer, lung cancer, ovarian cancer, stomach cancer, and thyroid cancer. This medicine may be used for other purposes; ask your health care provider or pharmacist if you have questions. COMMON BRAND NAME(S): Adriamycin, Adriamycin PFS, Adriamycin RDF, Rubex What should I tell my health care provider before I take this medicine? They need to know if you have any of these conditions:  heart disease  history of low blood counts caused by a medicine  liver disease  recent or ongoing radiation therapy  an unusual or allergic reaction to doxorubicin, other chemotherapy agents, other medicines, foods, dyes, or preservatives  pregnant or trying to get pregnant  breast-feeding How should I use this medicine? This drug is given as an infusion into a vein. It is administered in a hospital or clinic by a specially trained health care professional. If you have pain, swelling, burning or any unusual feeling around the site of your injection, tell your health care professional right away. Talk to your pediatrician regarding the use of this medicine in children. Special care may be needed. Overdosage: If you think you have taken too much of this medicine contact a poison control center or emergency room at once. NOTE: This medicine is only for you. Do not share this medicine with others. What if I miss a dose? It is important not to miss your dose. Call your doctor or health care professional if you are unable to keep an appointment. What may interact with this medicine? This  medicine may interact with the following medications:  6-mercaptopurine  paclitaxel  phenytoin  St. Giovannie's Wort  trastuzumab  verapamil This list may not describe all possible interactions. Give your health care provider a list of all the medicines, herbs, non-prescription drugs, or dietary supplements you use. Also tell them if you smoke, drink alcohol, or use illegal drugs. Some items may interact with your medicine. What should I watch for while using this medicine? This drug may make you feel generally unwell. This is not uncommon, as chemotherapy can affect healthy cells as well as cancer cells. Report any side effects. Continue your course of treatment even though you feel ill unless your doctor tells you to stop. There is a maximum amount of this medicine you should receive throughout your life. The amount depends on the medical condition being treated and your overall health. Your doctor will watch how much of this medicine you receive in your lifetime. Tell your doctor if you have taken this medicine before. You may need blood work done while you are taking this medicine. Your urine may turn red for a few days after your dose. This is not blood. If your urine is dark or brown, call your doctor. In some cases, you may be given additional medicines to help with side effects. Follow all directions for their use. Call your doctor or health care professional for advice if you get a fever, chills or sore throat, or other symptoms of a cold or flu. Do not treat yourself. This drug decreases your body's ability to fight infections. Try to avoid being around people who are sick. This medicine may increase  your risk to bruise or bleed. Call your doctor or health care professional if you notice any unusual bleeding. Talk to your doctor about your risk of cancer. You may be more at risk for certain types of cancers if you take this medicine. Do not become pregnant while taking this medicine or  for 6 months after stopping it. Women should inform their doctor if they wish to become pregnant or think they might be pregnant. Men should not father a child while taking this medicine and for 6 months after stopping it. There is a potential for serious side effects to an unborn child. Talk to your health care professional or pharmacist for more information. Do not breast-feed an infant while taking this medicine. This medicine has caused ovarian failure in some women and reduced sperm counts in some men This medicine may interfere with the ability to have a child. Talk with your doctor or health care professional if you are concerned about your fertility. This medicine may cause a decrease in Co-Enzyme Q-10. You should make sure that you get enough Co-Enzyme Q-10 while you are taking this medicine. Discuss the foods you eat and the vitamins you take with your health care professional. What side effects may I notice from receiving this medicine? Side effects that you should report to your doctor or health care professional as soon as possible:  allergic reactions like skin rash, itching or hives, swelling of the face, lips, or tongue  breathing problems  chest pain  fast or irregular heartbeat  low blood counts - this medicine may decrease the number of white blood cells, red blood cells and platelets. You may be at increased risk for infections and bleeding.  pain, redness, or irritation at site where injected  signs of infection - fever or chills, cough, sore throat, pain or difficulty passing urine  signs of decreased platelets or bleeding - bruising, pinpoint red spots on the skin, black, tarry stools, blood in the urine  swelling of the ankles, feet, hands  tiredness  weakness Side effects that usually do not require medical attention (report to your doctor or health care professional if they continue or are bothersome):  diarrhea  hair loss  mouth sores  nail discoloration  or damage  nausea  red colored urine  vomiting This list may not describe all possible side effects. Call your doctor for medical advice about side effects. You may report side effects to FDA at 1-800-FDA-1088. Where should I keep my medicine? This drug is given in a hospital or clinic and will not be stored at home. NOTE: This sheet is a summary. It may not cover all possible information. If you have questions about this medicine, talk to your doctor, pharmacist, or health care provider.  2020 Elsevier/Gold Standard (2016-09-16 11:01:26)

## 2020-02-22 NOTE — Progress Notes (Signed)
Ok to treat with creatinine of 1.57 per Judson Roch, NP. dph

## 2020-02-22 NOTE — Progress Notes (Signed)
No dose change for doxorubicin - Continue same doxorubicin dose at 25mg /m2 per MD.  Hardie Pulley, PharmD, BCPS, BCOP

## 2020-02-22 NOTE — Patient Instructions (Signed)

## 2020-02-26 ENCOUNTER — Other Ambulatory Visit (HOSPITAL_BASED_OUTPATIENT_CLINIC_OR_DEPARTMENT_OTHER): Payer: Self-pay | Admitting: Hematology & Oncology

## 2020-02-26 ENCOUNTER — Other Ambulatory Visit: Payer: Self-pay | Admitting: Hematology & Oncology

## 2020-02-26 DIAGNOSIS — C73 Malignant neoplasm of thyroid gland: Secondary | ICD-10-CM

## 2020-02-26 DIAGNOSIS — M130 Polyarthritis, unspecified: Secondary | ICD-10-CM

## 2020-02-27 ENCOUNTER — Telehealth: Payer: Self-pay

## 2020-02-27 NOTE — Progress Notes (Signed)
Dr. Marin Olp would like the doxorubicin careplan to be days 1,8 of 3 week cycle. Orders changed per his instructions (2 weeks on, 1 week off).

## 2020-02-27 NOTE — Telephone Encounter (Signed)
Returned pts call as he needs a later time on 02/29/20, done, AOM

## 2020-02-29 ENCOUNTER — Ambulatory Visit: Payer: 59

## 2020-02-29 ENCOUNTER — Other Ambulatory Visit: Payer: 59

## 2020-02-29 ENCOUNTER — Other Ambulatory Visit: Payer: Self-pay

## 2020-02-29 ENCOUNTER — Inpatient Hospital Stay: Payer: 59

## 2020-02-29 ENCOUNTER — Inpatient Hospital Stay (HOSPITAL_BASED_OUTPATIENT_CLINIC_OR_DEPARTMENT_OTHER): Payer: 59 | Admitting: Family

## 2020-02-29 ENCOUNTER — Encounter: Payer: Self-pay | Admitting: Family

## 2020-02-29 ENCOUNTER — Ambulatory Visit: Payer: 59 | Admitting: Family

## 2020-02-29 VITALS — BP 131/86 | HR 100 | Temp 98.4°F | Resp 18 | Ht 70.0 in | Wt 296.1 lb

## 2020-02-29 DIAGNOSIS — C73 Malignant neoplasm of thyroid gland: Secondary | ICD-10-CM

## 2020-02-29 DIAGNOSIS — Z5111 Encounter for antineoplastic chemotherapy: Secondary | ICD-10-CM | POA: Diagnosis not present

## 2020-02-29 DIAGNOSIS — D509 Iron deficiency anemia, unspecified: Secondary | ICD-10-CM

## 2020-02-29 DIAGNOSIS — E039 Hypothyroidism, unspecified: Secondary | ICD-10-CM

## 2020-02-29 DIAGNOSIS — C787 Secondary malignant neoplasm of liver and intrahepatic bile duct: Secondary | ICD-10-CM | POA: Diagnosis not present

## 2020-02-29 DIAGNOSIS — Z79899 Other long term (current) drug therapy: Secondary | ICD-10-CM | POA: Diagnosis not present

## 2020-02-29 LAB — CBC WITH DIFFERENTIAL (CANCER CENTER ONLY)
Abs Immature Granulocytes: 0.07 10*3/uL (ref 0.00–0.07)
Basophils Absolute: 0 10*3/uL (ref 0.0–0.1)
Basophils Relative: 1 %
Eosinophils Absolute: 0 10*3/uL (ref 0.0–0.5)
Eosinophils Relative: 0 %
HCT: 30.2 % — ABNORMAL LOW (ref 39.0–52.0)
Hemoglobin: 9.8 g/dL — ABNORMAL LOW (ref 13.0–17.0)
Immature Granulocytes: 1 %
Lymphocytes Relative: 23 %
Lymphs Abs: 1.2 10*3/uL (ref 0.7–4.0)
MCH: 30 pg (ref 26.0–34.0)
MCHC: 32.5 g/dL (ref 30.0–36.0)
MCV: 92.4 fL (ref 80.0–100.0)
Monocytes Absolute: 0.4 10*3/uL (ref 0.1–1.0)
Monocytes Relative: 9 %
Neutro Abs: 3.4 10*3/uL (ref 1.7–7.7)
Neutrophils Relative %: 66 %
Platelet Count: 234 10*3/uL (ref 150–400)
RBC: 3.27 MIL/uL — ABNORMAL LOW (ref 4.22–5.81)
RDW: 16.7 % — ABNORMAL HIGH (ref 11.5–15.5)
WBC Count: 5.2 10*3/uL (ref 4.0–10.5)
nRBC: 0 % (ref 0.0–0.2)

## 2020-02-29 LAB — CMP (CANCER CENTER ONLY)
ALT: 15 U/L (ref 0–44)
AST: 15 U/L (ref 15–41)
Albumin: 3.9 g/dL (ref 3.5–5.0)
Alkaline Phosphatase: 70 U/L (ref 38–126)
Anion gap: 8 (ref 5–15)
BUN: 20 mg/dL (ref 6–20)
CO2: 28 mmol/L (ref 22–32)
Calcium: 9.2 mg/dL (ref 8.9–10.3)
Chloride: 102 mmol/L (ref 98–111)
Creatinine: 0.85 mg/dL (ref 0.61–1.24)
GFR, Estimated: >60 mL/min (ref >60)
Glucose, Bld: 89 mg/dL (ref 70–99)
Potassium: 3.4 mmol/L — ABNORMAL LOW (ref 3.5–5.1)
Sodium: 138 mmol/L (ref 135–145)
Total Bilirubin: 0.3 mg/dL (ref 0.3–1.2)
Total Protein: 5.7 g/dL — ABNORMAL LOW (ref 6.5–8.1)

## 2020-02-29 LAB — LACTATE DEHYDROGENASE: LDH: 183 U/L (ref 98–192)

## 2020-02-29 LAB — RETICULOCYTES
Immature Retic Fract: 25.6 % — ABNORMAL HIGH (ref 2.3–15.9)
RBC.: 3.31 MIL/uL — ABNORMAL LOW (ref 4.22–5.81)
Retic Count, Absolute: 73.5 10*3/uL (ref 19.0–186.0)
Retic Ct Pct: 2.2 % (ref 0.4–3.1)

## 2020-02-29 MED ORDER — PALONOSETRON HCL INJECTION 0.25 MG/5ML
0.2500 mg | Freq: Once | INTRAVENOUS | Status: AC
  Administered 2020-02-29: 0.25 mg via INTRAVENOUS

## 2020-02-29 MED ORDER — PALONOSETRON HCL INJECTION 0.25 MG/5ML
INTRAVENOUS | Status: AC
  Filled 2020-02-29: qty 5

## 2020-02-29 MED ORDER — SODIUM CHLORIDE 0.9 % IV SOLN
Freq: Once | INTRAVENOUS | Status: AC
  Filled 2020-02-29: qty 250

## 2020-02-29 MED ORDER — SODIUM CHLORIDE 0.9% FLUSH
10.0000 mL | INTRAVENOUS | Status: DC | PRN
  Administered 2020-02-29: 10 mL
  Filled 2020-02-29: qty 10

## 2020-02-29 MED ORDER — DOXORUBICIN HCL CHEMO IV INJECTION 2 MG/ML
25.0000 mg/m2 | Freq: Once | INTRAVENOUS | Status: AC
  Administered 2020-02-29: 62 mg via INTRAVENOUS
  Filled 2020-02-29: qty 31

## 2020-02-29 MED ORDER — HEPARIN SOD (PORK) LOCK FLUSH 100 UNIT/ML IV SOLN
500.0000 [IU] | Freq: Once | INTRAVENOUS | Status: AC | PRN
  Administered 2020-02-29: 500 [IU]
  Filled 2020-02-29: qty 5

## 2020-02-29 MED ORDER — SODIUM CHLORIDE 0.9 % IV SOLN
10.0000 mg | Freq: Once | INTRAVENOUS | Status: AC
  Administered 2020-02-29: 10 mg via INTRAVENOUS
  Filled 2020-02-29: qty 10

## 2020-02-29 NOTE — Patient Instructions (Signed)
[facility] Discharge Instructions for Patients Receiving Chemotherapy  Today you received the following chemotherapy agents Doxorubicin  To help prevent nausea and vomiting after your treatment, we encourage you to take your nausea medication as prescribed by MD. **DO NOT TAKE ZOFRAN FOR 3 DAYS AFTER CHEMOTHERAPY**    If you develop nausea and vomiting that is not controlled by your nausea medication, call the clinic.   BELOW ARE SYMPTOMS THAT SHOULD BE REPORTED IMMEDIATELY:  *FEVER GREATER THAN 100.5 F  *CHILLS WITH OR WITHOUT FEVER  NAUSEA AND VOMITING THAT IS NOT CONTROLLED WITH YOUR NAUSEA MEDICATION  *UNUSUAL SHORTNESS OF BREATH  *UNUSUAL BRUISING OR BLEEDING  TENDERNESS IN MOUTH AND THROAT WITH OR WITHOUT PRESENCE OF ULCERS  *URINARY PROBLEMS  *BOWEL PROBLEMS  UNUSUAL RASH Items with * indicate a potential emergency and should be followed up as soon as possible.  Feel free to call the clinic should you have any questions or concerns. The clinic phone number is [phone].  Please show the [facility] at check-in to the Emergency Department and triage nurse.

## 2020-02-29 NOTE — Progress Notes (Addendum)
Hematology and Oncology Follow Up Visit  Brian [name] [id number] 1975-09-15 45 y.o. 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 is here today for follow-up and treatment. He has had some fatigue at times.  He has the same right sided abdominal pain that waxes and wanes.  No fever, chills, n/v, cough, rash, dizziness, SOB, chest pain, palpitations or changes in bowel or bladder habits. 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  He has mild swelling in his legs that is unchanged. Pedal pulses are 2+.  No falls or syncope.  He has maintained a good appetite and is staying well hydrated. His weight is stable at 296 lbs.  He has not noted any blood loss on Eliquis. No abnormal bruising, no petechiae.  He also mentioned that he is considering continuous leave/disability. I was not able to answer all of his questions and referred him to HR. He will e-mail them this week.   ECOG Performance Status: 1 - Symptomatic but completely ambulatory  Medications:  Allergies as of 02/29/2020      Reactions   Dextromethorphan-guaifenesin Other (See Comments)   Irregular heartbeat   Doxycycline Anaphylaxis, Nausea And Vomiting, Rash, Other (See Comments)   "heart arrythmia" and "dyspepsia" (only oral doxycycline causes reaction)   Gadobutrol Hives, Other (See Comments)   Patient had MRI scan at [facility]. Patient called one hour after he left imaging facility to report two "blisters" that came up on "back"  of lip.    Gadolinium Derivatives Hives   Patient had MRI scan at [facility]. Patient called one hour after he left imaging facility to report two "blisters" that came up on "back" of lip.    Guaifenesin Palpitations      Ibuprofen Hives, Itching   Lisinopril Other (See Comments)   Angioedema   Pantoprazole Itching   Tramadol Hives, Itching   Barium Rash   Developed redness around neck after drinking 1st bottle of Barocat; pt was given Benedryl by ED Mds to be "on the safe side" before drinking 2nd bottle   Chlorhexidine Hives      Medication List       Accurate as of February 29, 2020  1:10 PM. If you have any questions, ask your nurse or doctor.        apixaban 5 MG Tabs tablet Commonly known as: ELIQUIS Take 1 tablet (5 mg total) by mouth 2 (two) times daily.   cloNIDine 0.1 MG tablet Commonly known as: CATAPRES Take 0.1 mg by mouth 2 (two) times daily as needed (high blood pressure).   EPINEPHrine 0.3 mg/0.3 mL Soaj injection Commonly known as: EpiPen 2-Pak USE AS DIRECTED FOR LIFE THREATENING ALLERGIC REACTIONS What changed:   how much to take  how to take this  when to take this  reasons to take this  additional instructions   fluconazole 100 MG tablet Commonly known as: DIFLUCAN Take 1 tablet (100 mg total) by mouth daily.   furosemide  80 MG tablet Commonly known as: LASIX TAKE 1 TABLET (80 MG TOTAL) BY MOUTH DAILY. What changed:   when to take this  reasons to take this   gabapentin 300 MG capsule Commonly known as: NEURONTIN TAKE 1 CAPSULE (300 MG TOTAL) BY MOUTH 3 (THREE) TIMES DAILY.   levothyroxine 200 MCG tablet Commonly known as: SYNTHROID Take 1 tablet (200 mcg total) by mouth daily. Take along with 88 mcg=288 mcg   levothyroxine 75 MCG tablet Commonly known as: SYNTHROID Take 1 tablet (75 mcg total) by mouth daily before breakfast.   lidocaine-prilocaine cream Commonly known as: EMLA Apply to affected area once    LORazepam 0.5 MG tablet Commonly known as: ATIVAN TAKE 1 TABLET (0.5 MG TOTAL) BY MOUTH EVERY 6 (SIX) HOURS AS NEEDED (NAUSEA OR VOMITING).   losartan 25 MG tablet Commonly known as: COZAAR Take 1 tablet (25 mg total) by mouth 2 (two) times daily. What changed:   when to take this  reasons to take this   magic mouthwash Soln Take 10 mLs by mouth 4 (four) times daily as needed for mouth pain.   methylphenidate 10 MG tablet Commonly known as: RITALIN Take 1 tablet (10 mg total) by mouth 2 (two) times daily. What changed: when to take this   metoCLOPramide 10 MG tablet Commonly known as: REGLAN TAKE 1 TABLET BY MOUTH FOUR TIMES DAILY BEFORE MEALS AND AT BEDTIME What changed: See the new instructions.   metolazone 5 MG tablet Commonly known as: ZAROXOLYN TAKE 1 TABLET (5 MG TOTAL) BY MOUTH DAILY. TAKE 1 HOUR BEFORE LASIX. What changed:   when to take this  reasons to take this  additional instructions   multivitamin capsule Take 1 capsule by mouth daily.   nystatin 100000 UNIT/ML suspension Commonly known as: MYCOSTATIN Take 5 mLs (500,000 Units total) by mouth 4 (four) times daily.   OLANZapine 10 MG tablet Commonly known as: ZYPREXA Take 10 mg by mouth at bedtime.   oxyCODONE 20 mg 12 hr tablet Commonly known as: OXYCONTIN Take 1 tablet (20 mg total) by mouth every 12 (twelve) hours.   Oxycodone HCl 10 MG Tabs TAKE 1 TABLET (10 MG TOTAL) BY MOUTH EVERY 6 (SIX) HOURS AS NEEDED.   potassium chloride 10 MEQ tablet Commonly known as: KLOR-CON TAKE 1 TABLET (10 MEQ TOTAL) BY MOUTH DAILY.   prochlorperazine 10 MG tablet Commonly known as: COMPAZINE Take 1 tablet (10 mg total) by mouth every 6 (six) hours as needed (Nausea or vomiting).   sucralfate 1 GM/10ML suspension Commonly known as: Carafate Take 10 mLs (1 g total) by mouth 4 (four) times daily -  with meals and at bedtime. What changed:   when to take this  reasons to take this        Allergies:  Allergies  Allergen Reactions  . Dextromethorphan-Guaifenesin Other (See Comments)    Irregular heartbeat   . Doxycycline Anaphylaxis, Nausea And Vomiting, Rash and Other (See Comments)    "heart arrythmia" and "dyspepsia" (only oral doxycycline causes reaction)  . Gadobutrol Hives and Other (See Comments)    Patient had MRI scan at [facility]. Patient called one hour after he left imaging facility to report two "blisters" that came up on "back" of lip.    . Gadolinium Derivatives Hives    Patient had MRI scan at [facility]. Patient called one hour after he left imaging facility to report two "blisters" that came up on "back" of lip.   . Guaifenesin  Palpitations       . Ibuprofen Hives and Itching  . Lisinopril Other (See Comments)    Angioedema  . Pantoprazole Itching  . Tramadol Hives and Itching  . Barium Rash    Developed redness around neck after drinking 1st bottle of Barocat; pt was given Benedryl by ED Mds to be "on the safe side" before drinking 2nd bottle  . Chlorhexidine Hives    Past Medical History, Surgical history, Social history, and Family History were reviewed and updated.  Review of Systems: All other 10 point review of systems is negative.   Physical Exam:  height is _0  (1.778 m) and weight is 296 lb 1.3 oz (134.3 kg). His oral temperature is 98.4 F (36.9 C). His blood pressure is 131/86 and his pulse is 100. His respiration is 18 and oxygen saturation is 100%.   Wt Readings from Last 3 Encounters:  02/29/20 296 lb 1.3 oz (134.3 kg)  02/29/20 290 lb 1 oz (131.6 kg)  02/08/20 284 lb 6.3 oz (129 kg)    Ocular: Sclerae unicteric, pupils equal, round and reactive to light Ear-nose-throat: Oropharynx clear, dentition fair Lymphatic: No cervical or supraclavicular adenopathy Lungs no rales or rhonchi, good excursion bilaterally Heart regular rate and rhythm, no murmur appreciated Abd soft, mild tenderness right upper  quadrant, positive bowel sounds MSK no focal spinal tenderness, no joint edema Neuro: non-focal, well-oriented, appropriate affect Breasts: Deferred   Lab Results  Component Value Date   WBC 5.2 02/29/2020   HGB 9.8 (L) 02/29/2020   HCT 30.2 (L) 02/29/2020   MCV 92.4 02/29/2020   PLT 234 02/29/2020   Lab Results  Component Value Date   FERRITIN 370 (H) 01/18/2020   IRON 64 01/18/2020   TIBC 303 01/18/2020   UIBC 239 01/18/2020   IRONPCTSAT 21 01/18/2020   Lab Results  Component Value Date   RETICCTPCT 2.2 02/29/2020   RBC 3.31 (L) 02/29/2020   RBC 3.27 (L) 02/29/2020   No results found for: KPAFRELGTCHN, LAMBDASER, KAPLAMBRATIO No results found for: IGGSERUM, IGA, IGMSERUM No results found for: Odetta Pink, SPEI   Chemistry      Component Value Date/Time   NA 141 02/22/2020 1135   NA 141 02/06/2019 1210   K 3.8 02/22/2020 1135   CL 103 02/22/2020 1135   CO2 29 02/22/2020 1135   BUN 27 (H) 02/22/2020 1135   BUN 8 02/06/2019 1210   CREATININE 1.57 (H) 02/22/2020 1135      Component Value Date/Time   CALCIUM 10.0 02/22/2020 1135   ALKPHOS 76 02/22/2020 1135   AST 23 02/22/2020 1135   ALT 18 02/22/2020 1135   BILITOT 0.4 02/22/2020 1135       Impression and Plan: 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 We will proceed with treatment today as planned per MD.  Treatment next week and follow-up in 3 weeks with start of cycle 9.  He was encouraged to contact our office with any questions or concerns. We can certainly see him sooner if needed.   Laverna Peace, NP 1/13/20221:10 PM

## 2020-02-29 NOTE — Patient Instructions (Signed)

## 2020-03-01 LAB — FERRITIN: Ferritin: 464 ng/mL — ABNORMAL HIGH (ref 24–336)

## 2020-03-01 LAB — IRON AND TIBC
Iron: 51 ug/dL (ref 42–163)
Saturation Ratios: 21 % (ref 20–55)
TIBC: 246 ug/dL (ref 202–409)
UIBC: 195 ug/dL (ref 117–376)

## 2020-03-04 ENCOUNTER — Other Ambulatory Visit: Payer: Self-pay | Admitting: Hematology & Oncology

## 2020-03-04 ENCOUNTER — Other Ambulatory Visit (HOSPITAL_BASED_OUTPATIENT_CLINIC_OR_DEPARTMENT_OTHER): Payer: Self-pay | Admitting: Hematology & Oncology

## 2020-03-04 DIAGNOSIS — C73 Malignant neoplasm of thyroid gland: Secondary | ICD-10-CM

## 2020-03-05 ENCOUNTER — Other Ambulatory Visit (HOSPITAL_BASED_OUTPATIENT_CLINIC_OR_DEPARTMENT_OTHER): Payer: Self-pay | Admitting: Hematology & Oncology

## 2020-03-05 ENCOUNTER — Other Ambulatory Visit: Payer: Self-pay | Admitting: Hematology & Oncology

## 2020-03-05 DIAGNOSIS — C73 Malignant neoplasm of thyroid gland: Secondary | ICD-10-CM

## 2020-03-05 DIAGNOSIS — M130 Polyarthritis, unspecified: Secondary | ICD-10-CM

## 2020-03-06 ENCOUNTER — Telehealth: Payer: Self-pay

## 2020-03-06 ENCOUNTER — Other Ambulatory Visit: Payer: Self-pay

## 2020-03-06 ENCOUNTER — Inpatient Hospital Stay: Payer: 59

## 2020-03-06 DIAGNOSIS — C73 Malignant neoplasm of thyroid gland: Secondary | ICD-10-CM

## 2020-03-06 DIAGNOSIS — Z79899 Other long term (current) drug therapy: Secondary | ICD-10-CM | POA: Diagnosis not present

## 2020-03-06 DIAGNOSIS — C787 Secondary malignant neoplasm of liver and intrahepatic bile duct: Secondary | ICD-10-CM | POA: Diagnosis not present

## 2020-03-06 DIAGNOSIS — Z5111 Encounter for antineoplastic chemotherapy: Secondary | ICD-10-CM | POA: Diagnosis not present

## 2020-03-06 DIAGNOSIS — Z95828 Presence of other vascular implants and grafts: Secondary | ICD-10-CM

## 2020-03-06 LAB — COMPREHENSIVE METABOLIC PANEL
ALT: 14 U/L (ref 0–44)
AST: 13 U/L — ABNORMAL LOW (ref 15–41)
Albumin: 3.9 g/dL (ref 3.5–5.0)
Alkaline Phosphatase: 74 U/L (ref 38–126)
Anion gap: 10 (ref 5–15)
BUN: 27 mg/dL — ABNORMAL HIGH (ref 6–20)
CO2: 29 mmol/L (ref 22–32)
Calcium: 9.9 mg/dL (ref 8.9–10.3)
Chloride: 99 mmol/L (ref 98–111)
Creatinine, Ser: 0.96 mg/dL (ref 0.61–1.24)
GFR, Estimated: >60 mL/min (ref >60)
Glucose, Bld: 166 mg/dL — ABNORMAL HIGH (ref 70–99)
Potassium: 3.7 mmol/L (ref 3.5–5.1)
Sodium: 138 mmol/L (ref 135–145)
Total Bilirubin: 0.4 mg/dL (ref 0.3–1.2)
Total Protein: 6 g/dL — ABNORMAL LOW (ref 6.5–8.1)

## 2020-03-06 LAB — CBC WITH DIFFERENTIAL (CANCER CENTER ONLY)
Abs Immature Granulocytes: 0.01 10*3/uL (ref 0.00–0.07)
Basophils Absolute: 0 10*3/uL (ref 0.0–0.1)
Basophils Relative: 0 %
Eosinophils Absolute: 0 10*3/uL (ref 0.0–0.5)
Eosinophils Relative: 0 %
HCT: 33 % — ABNORMAL LOW (ref 39.0–52.0)
Hemoglobin: 10.8 g/dL — ABNORMAL LOW (ref 13.0–17.0)
Immature Granulocytes: 0 %
Lymphocytes Relative: 15 %
Lymphs Abs: 0.9 10*3/uL (ref 0.7–4.0)
MCH: 30.8 pg (ref 26.0–34.0)
MCHC: 32.7 g/dL (ref 30.0–36.0)
MCV: 94 fL (ref 80.0–100.0)
Monocytes Absolute: 0.2 10*3/uL (ref 0.1–1.0)
Monocytes Relative: 3 %
Neutro Abs: 4.6 10*3/uL (ref 1.7–7.7)
Neutrophils Relative %: 82 %
Platelet Count: 207 10*3/uL (ref 150–400)
RBC: 3.51 MIL/uL — ABNORMAL LOW (ref 4.22–5.81)
RDW: 18.3 % — ABNORMAL HIGH (ref 11.5–15.5)
WBC Count: 5.7 10*3/uL (ref 4.0–10.5)
nRBC: 0 % (ref 0.0–0.2)

## 2020-03-06 LAB — SAMPLE TO BLOOD BANK

## 2020-03-06 MED ORDER — SODIUM CHLORIDE 0.9% FLUSH
10.0000 mL | Freq: Once | INTRAVENOUS | Status: AC
  Administered 2020-03-06: 10 mL via INTRAVENOUS
  Filled 2020-03-06: qty 10

## 2020-03-06 MED ORDER — HEPARIN SOD (PORK) LOCK FLUSH 100 UNIT/ML IV SOLN
500.0000 [IU] | Freq: Once | INTRAVENOUS | Status: AC
  Administered 2020-03-06: 500 [IU] via INTRAVENOUS
  Filled 2020-03-06: qty 5

## 2020-03-06 NOTE — Telephone Encounter (Signed)
Patient called stating he is not feeling well today, states he doesn't feel 100% but doesn't know how to explain what is exactly wrong, just that he doesn't feel right. States she took a lasix yesterday and hasnt felt good since but is staying hydrated today. Wanted to know if he needed to come in for lab work today or needed to be seen.  Discussed with Dr.Ennever, patient to come in for CBC,CMET, TSH and type and screen today.   Called patient back and informed him, he said he can be here around 245, scheduling message sent. Lab orders placed.

## 2020-03-07 ENCOUNTER — Other Ambulatory Visit: Payer: 59

## 2020-03-07 ENCOUNTER — Ambulatory Visit: Payer: 59

## 2020-03-07 LAB — TSH: TSH: 0.445 u[IU]/mL (ref 0.320–4.118)

## 2020-03-11 ENCOUNTER — Other Ambulatory Visit: Payer: Self-pay | Admitting: Hematology & Oncology

## 2020-03-11 ENCOUNTER — Other Ambulatory Visit: Payer: Self-pay | Admitting: Family

## 2020-03-11 DIAGNOSIS — C73 Malignant neoplasm of thyroid gland: Secondary | ICD-10-CM

## 2020-03-11 DIAGNOSIS — R600 Localized edema: Secondary | ICD-10-CM

## 2020-03-12 ENCOUNTER — Other Ambulatory Visit (HOSPITAL_BASED_OUTPATIENT_CLINIC_OR_DEPARTMENT_OTHER): Payer: Self-pay | Admitting: Hematology & Oncology

## 2020-03-13 ENCOUNTER — Other Ambulatory Visit: Payer: Self-pay | Admitting: Occupational Medicine

## 2020-03-13 ENCOUNTER — Other Ambulatory Visit: Payer: Self-pay

## 2020-03-13 ENCOUNTER — Ambulatory Visit: Payer: Self-pay

## 2020-03-13 DIAGNOSIS — M79671 Pain in right foot: Secondary | ICD-10-CM

## 2020-03-14 ENCOUNTER — Inpatient Hospital Stay: Payer: 59

## 2020-03-14 DIAGNOSIS — C73 Malignant neoplasm of thyroid gland: Secondary | ICD-10-CM

## 2020-03-14 DIAGNOSIS — Z5111 Encounter for antineoplastic chemotherapy: Secondary | ICD-10-CM | POA: Diagnosis not present

## 2020-03-14 DIAGNOSIS — Z79899 Other long term (current) drug therapy: Secondary | ICD-10-CM | POA: Diagnosis not present

## 2020-03-14 DIAGNOSIS — C787 Secondary malignant neoplasm of liver and intrahepatic bile duct: Secondary | ICD-10-CM | POA: Diagnosis not present

## 2020-03-14 LAB — CBC WITH DIFFERENTIAL/PLATELET
Abs Immature Granulocytes: 0.02 10*3/uL (ref 0.00–0.07)
Basophils Absolute: 0 10*3/uL (ref 0.0–0.1)
Basophils Relative: 1 %
Eosinophils Absolute: 0 10*3/uL (ref 0.0–0.5)
Eosinophils Relative: 1 %
HCT: 35.5 % — ABNORMAL LOW (ref 39.0–52.0)
Hemoglobin: 11.5 g/dL — ABNORMAL LOW (ref 13.0–17.0)
Immature Granulocytes: 1 %
Lymphocytes Relative: 20 %
Lymphs Abs: 0.8 10*3/uL (ref 0.7–4.0)
MCH: 30.3 pg (ref 26.0–34.0)
MCHC: 32.4 g/dL (ref 30.0–36.0)
MCV: 93.7 fL (ref 80.0–100.0)
Monocytes Absolute: 0.5 10*3/uL (ref 0.1–1.0)
Monocytes Relative: 14 %
Neutro Abs: 2.5 10*3/uL (ref 1.7–7.7)
Neutrophils Relative %: 63 %
Platelets: 205 10*3/uL (ref 150–400)
RBC: 3.79 MIL/uL — ABNORMAL LOW (ref 4.22–5.81)
RDW: 18 % — ABNORMAL HIGH (ref 11.5–15.5)
WBC: 3.9 10*3/uL — ABNORMAL LOW (ref 4.0–10.5)
nRBC: 0 % (ref 0.0–0.2)

## 2020-03-14 LAB — COMPREHENSIVE METABOLIC PANEL
ALT: 19 U/L (ref 0–44)
AST: 20 U/L (ref 15–41)
Albumin: 4 g/dL (ref 3.5–5.0)
Alkaline Phosphatase: 92 U/L (ref 38–126)
Anion gap: 6 (ref 5–15)
BUN: 21 mg/dL — ABNORMAL HIGH (ref 6–20)
CO2: 30 mmol/L (ref 22–32)
Calcium: 10 mg/dL (ref 8.9–10.3)
Chloride: 103 mmol/L (ref 98–111)
Creatinine, Ser: 1.14 mg/dL (ref 0.61–1.24)
GFR, Estimated: >60 mL/min (ref >60)
Glucose, Bld: 112 mg/dL — ABNORMAL HIGH (ref 70–99)
Potassium: 4.1 mmol/L (ref 3.5–5.1)
Sodium: 139 mmol/L (ref 135–145)
Total Bilirubin: 0.5 mg/dL (ref 0.3–1.2)
Total Protein: 6 g/dL — ABNORMAL LOW (ref 6.5–8.1)

## 2020-03-14 MED ORDER — SODIUM CHLORIDE 0.9 % IV SOLN
10.0000 mg | Freq: Once | INTRAVENOUS | Status: AC
  Administered 2020-03-14: 10 mg via INTRAVENOUS
  Filled 2020-03-14: qty 10

## 2020-03-14 MED ORDER — DOXORUBICIN HCL CHEMO IV INJECTION 2 MG/ML
25.0000 mg/m2 | Freq: Once | INTRAVENOUS | Status: AC
  Administered 2020-03-14: 62 mg via INTRAVENOUS
  Filled 2020-03-14: qty 31

## 2020-03-14 MED ORDER — SODIUM CHLORIDE 0.9 % IV SOLN
Freq: Once | INTRAVENOUS | Status: AC
  Filled 2020-03-14: qty 250

## 2020-03-14 MED ORDER — PALONOSETRON HCL INJECTION 0.25 MG/5ML
0.2500 mg | Freq: Once | INTRAVENOUS | Status: AC
  Administered 2020-03-14: 0.25 mg via INTRAVENOUS

## 2020-03-14 MED ORDER — PALONOSETRON HCL INJECTION 0.25 MG/5ML
INTRAVENOUS | Status: AC
  Filled 2020-03-14: qty 5

## 2020-03-14 MED ORDER — SODIUM CHLORIDE 0.9% FLUSH
10.0000 mL | INTRAVENOUS | Status: DC | PRN
  Administered 2020-03-14: 10 mL
  Filled 2020-03-14: qty 10

## 2020-03-14 MED ORDER — HEPARIN SOD (PORK) LOCK FLUSH 100 UNIT/ML IV SOLN
500.0000 [IU] | Freq: Once | INTRAVENOUS | Status: AC | PRN
  Administered 2020-03-14: 500 [IU]
  Filled 2020-03-14: qty 5

## 2020-03-14 NOTE — Patient Instructions (Signed)
Doxorubicin injection What is this medicine? DOXORUBICIN (dox oh ROO bi sin) is a chemotherapy drug. It is used to treat many kinds of cancer like leukemia, lymphoma, neuroblastoma, sarcoma, and Wilms' tumor. It is also used to treat bladder cancer, breast cancer, lung cancer, ovarian cancer, stomach cancer, and thyroid cancer. This medicine may be used for other purposes; ask your health care provider or pharmacist if you have questions. COMMON BRAND NAME(S): Adriamycin, Adriamycin PFS, Adriamycin RDF, Rubex What should I tell my health care provider before I take this medicine? They need to know if you have any of these conditions:  heart disease  history of low blood counts caused by a medicine  liver disease  recent or ongoing radiation therapy  an unusual or allergic reaction to doxorubicin, other chemotherapy agents, other medicines, foods, dyes, or preservatives  pregnant or trying to get pregnant  breast-feeding How should I use this medicine? This drug is given as an infusion into a vein. It is administered in a hospital or clinic by a specially trained health care professional. If you have pain, swelling, burning or any unusual feeling around the site of your injection, tell your health care professional right away. Talk to your pediatrician regarding the use of this medicine in children. Special care may be needed. Overdosage: If you think you have taken too much of this medicine contact a poison control center or emergency room at once. NOTE: This medicine is only for you. Do not share this medicine with others. What if I miss a dose? It is important not to miss your dose. Call your doctor or health care professional if you are unable to keep an appointment. What may interact with this medicine? This medicine may interact with the following medications:  6-mercaptopurine  paclitaxel  phenytoin  St. Tru's Wort  trastuzumab  verapamil This list may not describe  all possible interactions. Give your health care provider a list of all the medicines, herbs, non-prescription drugs, or dietary supplements you use. Also tell them if you smoke, drink alcohol, or use illegal drugs. Some items may interact with your medicine. What should I watch for while using this medicine? This drug may make you feel generally unwell. This is not uncommon, as chemotherapy can affect healthy cells as well as cancer cells. Report any side effects. Continue your course of treatment even though you feel ill unless your doctor tells you to stop. There is a maximum amount of this medicine you should receive throughout your life. The amount depends on the medical condition being treated and your overall health. Your doctor will watch how much of this medicine you receive in your lifetime. Tell your doctor if you have taken this medicine before. You may need blood work done while you are taking this medicine. Your urine may turn red for a few days after your dose. This is not blood. If your urine is dark or brown, call your doctor. In some cases, you may be given additional medicines to help with side effects. Follow all directions for their use. Call your doctor or health care professional for advice if you get a fever, chills or sore throat, or other symptoms of a cold or flu. Do not treat yourself. This drug decreases your body's ability to fight infections. Try to avoid being around people who are sick. This medicine may increase your risk to bruise or bleed. Call your doctor or health care professional if you notice any unusual bleeding. Talk to your doctor  about your risk of cancer. You may be more at risk for certain types of cancers if you take this medicine. Do not become pregnant while taking this medicine or for 6 months after stopping it. Women should inform their doctor if they wish to become pregnant or think they might be pregnant. Men should not father a child while taking this  medicine and for 6 months after stopping it. There is a potential for serious side effects to an unborn child. Talk to your health care professional or pharmacist for more information. Do not breast-feed an infant while taking this medicine. This medicine has caused ovarian failure in some women and reduced sperm counts in some men This medicine may interfere with the ability to have a child. Talk with your doctor or health care professional if you are concerned about your fertility. This medicine may cause a decrease in Co-Enzyme Q-10. You should make sure that you get enough Co-Enzyme Q-10 while you are taking this medicine. Discuss the foods you eat and the vitamins you take with your health care professional. What side effects may I notice from receiving this medicine? Side effects that you should report to your doctor or health care professional as soon as possible:  allergic reactions like skin rash, itching or hives, swelling of the face, lips, or tongue  breathing problems  chest pain  fast or irregular heartbeat  low blood counts - this medicine may decrease the number of white blood cells, red blood cells and platelets. You may be at increased risk for infections and bleeding.  pain, redness, or irritation at site where injected  signs of infection - fever or chills, cough, sore throat, pain or difficulty passing urine  signs of decreased platelets or bleeding - bruising, pinpoint red spots on the skin, black, tarry stools, blood in the urine  swelling of the ankles, feet, hands  tiredness  weakness Side effects that usually do not require medical attention (report to your doctor or health care professional if they continue or are bothersome):  diarrhea  hair loss  mouth sores  nail discoloration or damage  nausea  red colored urine  vomiting This list may not describe all possible side effects. Call your doctor for medical advice about side effects. You may report  side effects to FDA at 1-800-FDA-1088. Where should I keep my medicine? This drug is given in a hospital or clinic and will not be stored at home. NOTE: This sheet is a summary. It may not cover all possible information. If you have questions about this medicine, talk to your doctor, pharmacist, or health care provider.  2021 Elsevier/Gold Standard (2016-09-16 11:01:26)

## 2020-03-14 NOTE — Addendum Note (Signed)
Addended by: Burney Gauze R on: 03/14/2020 12:07 PM   Modules accepted: Orders

## 2020-03-14 NOTE — Patient Instructions (Signed)

## 2020-03-18 ENCOUNTER — Other Ambulatory Visit: Payer: Self-pay | Admitting: *Deleted

## 2020-03-18 ENCOUNTER — Telehealth: Payer: Self-pay | Admitting: *Deleted

## 2020-03-18 NOTE — Telephone Encounter (Signed)
Call received from patient stating that he tested positive for covid-19 on 03/17/20.  Dr. Marin Olp notified and referral placed to monoclonal antibody clinic.

## 2020-03-18 NOTE — Progress Notes (Unsigned)
Ambulatory

## 2020-03-19 ENCOUNTER — Other Ambulatory Visit (HOSPITAL_COMMUNITY): Payer: Self-pay | Admitting: Adult Health

## 2020-03-19 ENCOUNTER — Encounter: Payer: Self-pay | Admitting: Adult Health

## 2020-03-19 DIAGNOSIS — U071 COVID-19: Secondary | ICD-10-CM

## 2020-03-19 NOTE — Progress Notes (Signed)
I connected by phone with [name] on 03/19/2020 at 1:01 PM to discuss the potential use of a new treatment for mild to moderate COVID-19 viral infection in non-hospitalized patients.  This patient is a 45 y.o. male that meets the FDA criteria for Emergency Use Authorization of COVID monoclonal antibody sotrovimab.  Has a (+) direct SARS-CoV-2 viral test result  Has mild or moderate COVID-19   Is NOT hospitalized due to COVID-19  Is within 10 days of symptom onset  Has at least one of the high risk factor(s) for progression to severe COVID-19 and/or hospitalization as defined in EUA.  Specific high risk criteria : BMI > 25, Diabetes and Cardiovascular disease or hypertension   Sx onset 1/29, on chemotherapy   I have spoken and communicated the following to the patient or parent/caregiver regarding COVID monoclonal antibody treatment:  1. FDA has authorized the emergency use for the treatment of mild to moderate COVID-19 in adults and pediatric patients with positive results of direct SARS-CoV-2 viral testing who are 37 years of age and older weighing at least 40 kg, and who are at high risk for progressing to severe COVID-19 and/or hospitalization.  2. The significant known and potential risks and benefits of COVID monoclonal antibody, and the extent to which such potential risks and benefits are unknown.  3. Information on available alternative treatments and the risks and benefits of those alternatives, including clinical trials.  4. Patients treated with COVID monoclonal antibody should continue to self-isolate and use infection control measures (e.g., wear mask, isolate, social distance, avoid sharing personal items, clean and disinfect "high touch" surfaces, and frequent handwashing) according to CDC guidelines.   5. The patient or parent/caregiver has the option to accept or refuse COVID monoclonal antibody treatment.  After reviewing this information with the patient, the  patient has agreed to receive one of the available covid 19 monoclonal antibodies and will be provided an appropriate fact sheet prior to infusion. Scot Dock, NP 03/19/2020 1:01 PM

## 2020-03-20 ENCOUNTER — Ambulatory Visit (HOSPITAL_COMMUNITY)
Admission: RE | Admit: 2020-03-20 | Discharge: 2020-03-20 | Disposition: A | Discharge status: Home / Self Care | Payer: 59 | Source: Ambulatory Visit | Attending: Pulmonary Disease | Admitting: Pulmonary Disease

## 2020-03-20 DIAGNOSIS — U071 COVID-19: Secondary | ICD-10-CM | POA: Diagnosis not present

## 2020-03-20 MED ORDER — EPINEPHRINE 0.3 MG/0.3ML IJ SOAJ: 0.3000 mg | Freq: Once | INTRAMUSCULAR | Status: DC | PRN

## 2020-03-20 MED ORDER — ALBUTEROL SULFATE HFA 108 (90 BASE) MCG/ACT IN AERS: 2.0000 | INHALATION_SPRAY | Freq: Once | RESPIRATORY_TRACT | Status: DC | PRN

## 2020-03-20 MED ORDER — SOTROVIMAB 500 MG/8ML IV SOLN
500.0000 mg | Freq: Once | INTRAVENOUS | Status: AC
  Administered 2020-03-20: 500 mg via INTRAVENOUS

## 2020-03-20 MED ORDER — FAMOTIDINE IN NACL 20-0.9 MG/50ML-% IV SOLN: 20.0000 mg | Freq: Once | INTRAVENOUS | Status: DC | PRN

## 2020-03-20 MED ORDER — METHYLPREDNISOLONE SODIUM SUCC 125 MG IJ SOLR: 125.0000 mg | Freq: Once | INTRAMUSCULAR | Status: DC | PRN

## 2020-03-20 MED ORDER — SODIUM CHLORIDE 0.9 % IV SOLN: INTRAVENOUS | Status: DC | PRN

## 2020-03-20 MED ORDER — HEPARIN SOD (PORK) LOCK FLUSH 100 UNIT/ML IV SOLN
500.0000 [IU] | Freq: Once | INTRAVENOUS | Status: AC
  Administered 2020-03-20: 500 [IU] via INTRAVENOUS

## 2020-03-20 MED ORDER — DIPHENHYDRAMINE HCL 50 MG/ML IJ SOLN: 50.0000 mg | Freq: Once | INTRAMUSCULAR | Status: DC | PRN

## 2020-03-20 NOTE — Progress Notes (Signed)
Patient reviewed Fact Sheet for Patients, Parents, and Caregivers for Emergency Use Authorization (EUA) of sotrovimab for the Treatment of Coronavirus. Patient also reviewed and is agreeable to the estimated cost of treatment. Patient is agreeable to proceed.   

## 2020-03-20 NOTE — Progress Notes (Signed)
Diagnosis: COVID-19  Physician: Dr. Patrick Wright  Procedure: Covid Infusion Clinic Med: Sotrovimab infusion - Provided patient with sotrovimab fact sheet for patients, parents, and caregivers prior to infusion.   Complications: No immediate complications noted  Discharge: Discharged home    

## 2020-03-20 NOTE — Discharge Instructions (Signed)

## 2020-03-21 ENCOUNTER — Ambulatory Visit: Payer: 59

## 2020-03-21 ENCOUNTER — Ambulatory Visit: Payer: 59 | Admitting: Hematology & Oncology

## 2020-03-21 ENCOUNTER — Other Ambulatory Visit: Payer: 59

## 2020-03-26 ENCOUNTER — Telehealth: Payer: Self-pay | Admitting: *Deleted

## 2020-03-26 ENCOUNTER — Other Ambulatory Visit (HOSPITAL_BASED_OUTPATIENT_CLINIC_OR_DEPARTMENT_OTHER): Payer: Self-pay | Admitting: Hematology & Oncology

## 2020-03-26 ENCOUNTER — Other Ambulatory Visit: Payer: Self-pay | Admitting: *Deleted

## 2020-03-26 DIAGNOSIS — C73 Malignant neoplasm of thyroid gland: Secondary | ICD-10-CM

## 2020-03-26 DIAGNOSIS — M130 Polyarthritis, unspecified: Secondary | ICD-10-CM

## 2020-03-26 MED ORDER — OXYCODONE HCL ER 20 MG PO T12A: 20.0000 mg | EXTENDED_RELEASE_TABLET | Freq: Three times a day (TID) | ORAL | 0 refills | Status: DC

## 2020-03-26 NOTE — Telephone Encounter (Signed)
Patient called stating that his pain medicine was not helping much. Dr Marin Olp notified.  Increased Oxycontin 20 mg to evry 8 hours instead of every 12

## 2020-03-29 ENCOUNTER — Other Ambulatory Visit (HOSPITAL_BASED_OUTPATIENT_CLINIC_OR_DEPARTMENT_OTHER): Payer: Self-pay | Admitting: Family

## 2020-03-29 ENCOUNTER — Other Ambulatory Visit: Payer: Self-pay | Admitting: Hematology & Oncology

## 2020-03-29 ENCOUNTER — Encounter: Payer: Self-pay | Admitting: *Deleted

## 2020-03-29 DIAGNOSIS — C73 Malignant neoplasm of thyroid gland: Secondary | ICD-10-CM

## 2020-04-04 ENCOUNTER — Ambulatory Visit: Payer: 59

## 2020-04-04 ENCOUNTER — Other Ambulatory Visit: Payer: 59

## 2020-04-08 ENCOUNTER — Other Ambulatory Visit: Payer: Self-pay

## 2020-04-08 ENCOUNTER — Inpatient Hospital Stay (HOSPITAL_BASED_OUTPATIENT_CLINIC_OR_DEPARTMENT_OTHER): Payer: 59 | Admitting: Family

## 2020-04-08 ENCOUNTER — Encounter: Payer: Self-pay | Admitting: Family

## 2020-04-08 ENCOUNTER — Inpatient Hospital Stay: Payer: 59 | Attending: Hematology & Oncology

## 2020-04-08 ENCOUNTER — Other Ambulatory Visit (HOSPITAL_BASED_OUTPATIENT_CLINIC_OR_DEPARTMENT_OTHER): Payer: Self-pay | Admitting: Family

## 2020-04-08 ENCOUNTER — Inpatient Hospital Stay: Payer: 59

## 2020-04-08 VITALS — BP 125/89 | HR 92 | Temp 98.5°F | Resp 18

## 2020-04-08 VITALS — BP 128/93 | HR 116 | Temp 98.3°F | Resp 20 | Ht 70.0 in | Wt 290.0 lb

## 2020-04-08 DIAGNOSIS — R42 Dizziness and giddiness: Secondary | ICD-10-CM | POA: Insufficient documentation

## 2020-04-08 DIAGNOSIS — Z95828 Presence of other vascular implants and grafts: Secondary | ICD-10-CM

## 2020-04-08 DIAGNOSIS — C787 Secondary malignant neoplasm of liver and intrahepatic bile duct: Secondary | ICD-10-CM | POA: Insufficient documentation

## 2020-04-08 DIAGNOSIS — E039 Hypothyroidism, unspecified: Secondary | ICD-10-CM | POA: Diagnosis not present

## 2020-04-08 DIAGNOSIS — D509 Iron deficiency anemia, unspecified: Secondary | ICD-10-CM

## 2020-04-08 DIAGNOSIS — R5383 Other fatigue: Secondary | ICD-10-CM | POA: Insufficient documentation

## 2020-04-08 DIAGNOSIS — R000 Tachycardia, unspecified: Secondary | ICD-10-CM | POA: Diagnosis not present

## 2020-04-08 DIAGNOSIS — C73 Malignant neoplasm of thyroid gland: Secondary | ICD-10-CM | POA: Insufficient documentation

## 2020-04-08 LAB — CMP (CANCER CENTER ONLY)
ALT: 22 U/L (ref 0–44)
AST: 23 U/L (ref 15–41)
Albumin: 3.7 g/dL (ref 3.5–5.0)
Alkaline Phosphatase: 126 U/L (ref 38–126)
Anion gap: 8 (ref 5–15)
BUN: 18 mg/dL (ref 6–20)
CO2: 28 mmol/L (ref 22–32)
Calcium: 9.8 mg/dL (ref 8.9–10.3)
Chloride: 101 mmol/L (ref 98–111)
Creatinine: 0.96 mg/dL (ref 0.61–1.24)
GFR, Estimated: >60 mL/min (ref >60)
Glucose, Bld: 102 mg/dL — ABNORMAL HIGH (ref 70–99)
Potassium: 4.2 mmol/L (ref 3.5–5.1)
Sodium: 137 mmol/L (ref 135–145)
Total Bilirubin: 0.5 mg/dL (ref 0.3–1.2)
Total Protein: 6.5 g/dL (ref 6.5–8.1)

## 2020-04-08 LAB — CBC WITH DIFFERENTIAL (CANCER CENTER ONLY)
Abs Immature Granulocytes: 0.06 10*3/uL (ref 0.00–0.07)
Basophils Absolute: 0 10*3/uL (ref 0.0–0.1)
Basophils Relative: 0 %
Eosinophils Absolute: 0.1 10*3/uL (ref 0.0–0.5)
Eosinophils Relative: 2 %
HCT: 30.6 % — ABNORMAL LOW (ref 39.0–52.0)
Hemoglobin: 9.6 g/dL — ABNORMAL LOW (ref 13.0–17.0)
Immature Granulocytes: 1 %
Lymphocytes Relative: 12 %
Lymphs Abs: 0.6 10*3/uL — ABNORMAL LOW (ref 0.7–4.0)
MCH: 28.2 pg (ref 26.0–34.0)
MCHC: 31.4 g/dL (ref 30.0–36.0)
MCV: 90 fL (ref 80.0–100.0)
Monocytes Absolute: 0.6 10*3/uL (ref 0.1–1.0)
Monocytes Relative: 12 %
Neutro Abs: 3.8 10*3/uL (ref 1.7–7.7)
Neutrophils Relative %: 73 %
Platelet Count: 370 10*3/uL (ref 150–400)
RBC: 3.4 MIL/uL — ABNORMAL LOW (ref 4.22–5.81)
RDW: 17.8 % — ABNORMAL HIGH (ref 11.5–15.5)
WBC Count: 5.2 10*3/uL (ref 4.0–10.5)
nRBC: 0 % (ref 0.0–0.2)

## 2020-04-08 LAB — RETICULOCYTES
Immature Retic Fract: 31.4 % — ABNORMAL HIGH (ref 2.3–15.9)
RBC.: 3.36 MIL/uL — ABNORMAL LOW (ref 4.22–5.81)
Retic Count, Absolute: 58.1 10*3/uL (ref 19.0–186.0)
Retic Ct Pct: 1.7 % (ref 0.4–3.1)

## 2020-04-08 LAB — LACTATE DEHYDROGENASE: LDH: 285 U/L — ABNORMAL HIGH (ref 98–192)

## 2020-04-08 MED ORDER — HEPARIN SOD (PORK) LOCK FLUSH 100 UNIT/ML IV SOLN
500.0000 [IU] | Freq: Once | INTRAVENOUS | Status: AC
  Administered 2020-04-08: 500 [IU] via INTRAVENOUS
  Filled 2020-04-08: qty 5

## 2020-04-08 MED ORDER — OXYCODONE HCL ER 30 MG PO T12A: 30.0000 mg | EXTENDED_RELEASE_TABLET | Freq: Three times a day (TID) | ORAL | 0 refills | Status: DC

## 2020-04-08 MED ORDER — SODIUM CHLORIDE 0.9% FLUSH
10.0000 mL | Freq: Once | INTRAVENOUS | Status: AC
  Administered 2020-04-08: 10 mL via INTRAVENOUS
  Filled 2020-04-08: qty 10

## 2020-04-08 MED ORDER — SODIUM CHLORIDE 0.9 % IV SOLN
Freq: Once | INTRAVENOUS | Status: AC
  Filled 2020-04-08: qty 250

## 2020-04-08 NOTE — Patient Instructions (Signed)
Tunneled Central Venous Catheter Flushing Guide  It is important to flush your tunneled central venous catheter each time you use it, both before and after you use it. Flushing your catheter will help prevent it from clogging. What are the risks? Risks may include:  Infection.  Air getting into the catheter and bloodstream. Supplies needed:  A clean pair of gloves.  A disinfecting wipe. Use an alcohol wipe, chlorhexidine wipe, or iodine wipe as told by your health care provider.  A 10 mL syringe that has been prefilled with saline solution.  An empty 10 mL syringe, if a substance called heparin was injected into your catheter. How to flush your catheter When you flush your catheter, make sure you follow any specific instructions from your health care provider or the manufacturer. These are general guidelines. Flushing your catheter before use If there is heparin in your catheter: 1. Wash your hands with soap and water. 2. Put on gloves. 3. Scrub the injection cap for a minimum of 15 seconds with a disinfecting wipe. 4. Unclamp the catheter. 5. Attach the empty syringe to the injection cap. 6. Pull the syringe plunger back and withdraw 10 mL of blood. 7. Place the syringe into an appropriate waste container. 8. Scrub the injection cap for 15 seconds with a disinfecting wipe. 9. Attach the prefilled syringe to the injection cap. 10. Flush the catheter by pushing the plunger forward until all the liquid from the syringe is in the catheter. 11. Remove the syringe from the injection cap. 12. Clamp the catheter. If there is no heparin in your catheter: 1. Wash your hands with soap and water. 2. Put on gloves. 3. Scrub the injection cap for 15 seconds with a disinfecting wipe. 4. Unclamp the catheter. 5. Attach the prefilled syringe to the injection cap. 6. Flush the catheter by pushing the plunger forward until 5 mL of the liquid from the syringe is in the catheter. 7. Pull back on  the syringe until you see blood in the catheter. 8. If you have been asked to collect any blood, follow your health care provider's instructions. Otherwise, flush the catheter with the rest of the solution from the syringe. 9. Remove the syringe from the injection cap. 10. Clamp the catheter.   Flushing your catheter after use 1. Wash your hands with soap and water. 2. Put on gloves. 3. Scrub the injection cap for 15 seconds with a disinfecting wipe. 4. Unclamp the catheter. 5. Attach the prefilled syringe to the injection cap. 6. Flush the catheter by pushing the plunger forward until all of the liquid from the syringe is in the catheter. 7. Remove the syringe from the injection cap. 8. Clamp the catheter. Problems and solutions  If blood cannot be completely cleared from the injection cap, you may need to have the injection cap replaced.  If the catheter is difficult to flush, use the pulsing method. The pulsing method involves pushing only a few milliliters of solution into the catheter at a time and pausing between pushes.  If you do not see blood in the catheter when you pull back on the syringe, change your body position, such as by raising your arms above your head. Take a deep breath and cough. Then, pull back on the syringe. If you still do not see blood, flush the catheter with a small amount of solution. Then, change positions again and take a breath or cough. Pull back on the syringe again. If you still do not   see blood, finish flushing the catheter and contact your health care provider. Do not use your catheter until your health care provider says it is okay. General tips  Have someone help you flush your catheter, if possible.  Do not force fluid through your catheter.  Do not use a syringe that is larger or smaller than 10 mL. Using a smaller syringe can make the catheter burst.  Do not use your catheter without flushing it first if it has heparin in it. Contact a health  care provider if:  You cannot see any blood in the catheter when you flush it before using it.  Your catheter is difficult to flush. Get help right away if:  You cannot flush the catheter.  The catheter leaks when you flush it or when there is fluid in it.  There are cracks or breaks in the catheter. Summary  It is important to flush your tunneled central venous catheter each time you use it, both before and after you use it.  Scrub the injection cap for 15 seconds with a disinfecting wipe before and after you flush it.  When you flush your catheter, make sure you follow any specific instructions from your health care provider or the manufacturer.  Get help right away if you cannot flush the catheter. This information is not intended to replace advice given to you by your health care provider. Make sure you discuss any questions you have with your health care provider. Document Revised: 04/13/2019 Document Reviewed: 04/20/2018 Elsevier Patient Education  2021 Elsevier Inc.  

## 2020-04-08 NOTE — Progress Notes (Signed)
Hematology and Oncology Follow Up Visit  Brian [name] [id number] 04/30/1975 45 y.o. 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 is here today for follow-up. He is symptomatic with fatigued and states that if he sits for long in one place he falls asleep.  He had an episodes of dizziness where the room started spinning when he bent over to put a leash on his dog. He states that he is unsure if this was vertigo. Which he has experienced in the past  His HR at this time is 116, BP 128/93, O2 99% on room air.  Heart rate and rhythm regular on exam.  No adenopathy noted on exam.  No fever, chills, cough, rash, SOB, chest pain, palpitations, or changes in bowel or bladder habits.  He notes the same right sided abdominal pain as well as pain in the left hip and knee. He states that this persists despite his taking the long acting Oxycontin TID and then Oxycodone as needed every 6 hours for break through pain. He has not noted any blood loss. No bruising or petechiae.  He states that is appetite is ok. This comes and goes. He has occasional nausea. He is trying his best to stay well hydrated. His weight is stable at 291 lbs.  He has some mild swelling in his ankles that he states seems to be a stable. Pedal pulses are 2+.  He states that he is really enjoying his new job with [employer].   ECOG Performance Status: 1 - Symptomatic but completely ambulatory  Medications:  Allergies as of 04/08/2020      Reactions    Dextromethorphan-guaifenesin Other (See Comments)   Irregular heartbeat   Doxycycline Anaphylaxis, Nausea And Vomiting, Rash, Other (See Comments)   "heart arrythmia" and "dyspepsia" (only oral doxycycline causes reaction)   Gadobutrol Hives, Other (See Comments)   Patient had MRI scan at [facility]. Patient called one hour after he left imaging facility to report two "blisters" that came up on "back" of lip.    Gadolinium Derivatives Hives   Patient had MRI scan at [place]. Patient called one hour after he left imaging facility to report two "blisters" that came up on "back" of lip.    Guaifenesin Palpitations      Ibuprofen Hives, Itching   Lisinopril Other (See Comments)   Angioedema   Pantoprazole Itching   Tramadol Hives, Itching   Barium Rash   Developed redness around neck after drinking 1st bottle of Barocat; pt was given Benedryl by ED Mds to be "on the safe side" before drinking 2nd bottle   Chlorhexidine Hives      Medication List       Accurate as of April 08, 2020 12:52 PM. If you have any questions, ask your nurse or doctor.        apixaban 5 MG Tabs tablet Commonly known as: ELIQUIS Take 1 tablet (5 mg total) by mouth 2 (two) times daily.  cloNIDine 0.1 MG tablet Commonly known as: CATAPRES Take 0.1 mg by mouth 2 (two) times daily as needed (high blood pressure).   EPINEPHrine 0.3 mg/0.3 mL Soaj injection Commonly known as: EpiPen 2-Pak USE AS DIRECTED FOR LIFE THREATENING ALLERGIC REACTIONS What changed:   how much to take  how to take this  when to take this  reasons to take this  additional instructions   fluconazole 100 MG tablet Commonly known as: DIFLUCAN Take 1 tablet (100 mg total) by mouth daily.   furosemide 80 MG tablet Commonly known as: LASIX TAKE 1 TABLET (80 MG TOTAL) BY MOUTH DAILY. What changed:   when to take this  reasons to take this   gabapentin 300 MG capsule Commonly known as: NEURONTIN TAKE 1  CAPSULE (300 MG TOTAL) BY MOUTH 3 (THREE) TIMES DAILY.   levothyroxine 200 MCG tablet Commonly known as: SYNTHROID Take 1 tablet (200 mcg total) by mouth daily. Take along with 88 mcg=288 mcg   levothyroxine 75 MCG tablet Commonly known as: SYNTHROID Take 1 tablet (75 mcg total) by mouth daily before breakfast.   lidocaine-prilocaine cream Commonly known as: EMLA Apply to affected area once   LORazepam 0.5 MG tablet Commonly known as: ATIVAN TAKE 1 TABLET BY MOUTH EVERY 6 HOURS AS NEEDED FOR NAUSEA & VOMITING   losartan 25 MG tablet Commonly known as: COZAAR Take 1 tablet (25 mg total) by mouth 2 (two) times daily. What changed:   when to take this  reasons to take this   magic mouthwash Soln Take 10 mLs by mouth 4 (four) times daily as needed for mouth pain.   methylphenidate 10 MG tablet Commonly known as: RITALIN TAKE 1 TABLET (10 MG TOTAL) BY MOUTH 2 (TWO) TIMES DAILY.   metoCLOPramide 10 MG tablet Commonly known as: REGLAN TAKE 1 TABLET BY MOUTH FOUR TIMES DAILY BEFORE MEALS AND AT BEDTIME What changed: See the new instructions.   metolazone 5 MG tablet Commonly known as: ZAROXOLYN TAKE 1 TABLET (5 MG TOTAL) BY MOUTH DAILY. TAKE 1 HOUR BEFORE LASIX. What changed:   when to take this  reasons to take this  additional instructions   multivitamin capsule Take 1 capsule by mouth daily.   nystatin 100000 UNIT/ML suspension Commonly known as: MYCOSTATIN Take 5 mLs (500,000 Units total) by mouth 4 (four) times daily.   OLANZapine 10 MG tablet Commonly known as: ZYPREXA Take 10 mg by mouth at bedtime.   oxyCODONE 20 mg 12 hr tablet Commonly known as: OXYCONTIN TAKE 1 TABLET (20 MG TOTAL) BY MOUTH EVERY 12 (TWELVE) HOURS.   Oxycodone HCl 10 MG Tabs TAKE 1 TABLET BY MOUTH EVERY 6 HOURS AS NEEDED   oxyCODONE 20 mg 12 hr tablet Commonly known as: OXYCONTIN Take 1 tablet (20 mg total) by mouth every 8 (eight) hours.   potassium chloride 10 MEQ  tablet Commonly known as: KLOR-CON TAKE 1 TABLET BY MOUTH ONCE DAILY   prochlorperazine 10 MG tablet Commonly known as: COMPAZINE Take 1 tablet (10 mg total) by mouth every 6 (six) hours as needed (Nausea or vomiting).   sucralfate 1 GM/10ML suspension Commonly known as: Carafate Take 10 mLs (1 g total) by mouth 4 (four) times daily -  with meals and at bedtime. What changed:   when to take this  reasons to take this       Allergies:  Allergies  Allergen Reactions  . Dextromethorphan-Guaifenesin Other (See Comments)    Irregular heartbeat   . Doxycycline  Anaphylaxis, Nausea And Vomiting, Rash and Other (See Comments)    "heart arrythmia" and "dyspepsia" (only oral doxycycline causes reaction)  . Gadobutrol Hives and Other (See Comments)    Patient had MRI scan at [facility]. Patient called one hour after he left imaging facility to report two "blisters" that came up on "back" of lip.    . Gadolinium Derivatives Hives    Patient had MRI scan at [place]. Patient called one hour after he left imaging facility to report two "blisters" that came up on "back" of lip.   . Guaifenesin Palpitations       . Ibuprofen Hives and Itching  . Lisinopril Other (See Comments)    Angioedema  . Pantoprazole Itching  . Tramadol Hives and Itching  . Barium Rash    Developed redness around neck after drinking 1st bottle of Barocat; pt was given Benedryl by ED Mds to be "on the safe side" before drinking 2nd bottle  . Chlorhexidine Hives    Past Medical History, Surgical history, Social history, and Family History were reviewed and updated.  Review of Systems: All other 10 point review of systems is negative.   Physical Exam:  height is $RemoveB'5\' 10"'[id number]$  (1.778 m) and weight is 290 lb (131.5 kg). His oral temperature is 98.3 F (36.8 C). His blood pressure is 128/93 (abnormal) and his pulse is 116 (abnormal). His respiration is 20 and oxygen saturation is 99%.   Wt Readings  from Last 3 Encounters:  04/08/20 290 lb (131.5 kg)  02/29/20 296 lb 1.3 oz (134.3 kg)  02/29/20 290 lb 1 oz (131.6 kg)    Ocular: Sclerae unicteric, pupils equal, round and reactive to light Ear-nose-throat: Oropharynx clear, dentition fair Lymphatic: No cervical or supraclavicular adenopathy Lungs no rales or rhonchi, good excursion bilaterally Heart regular rate and rhythm, no murmur appreciated Abd soft, nontender, positive bowel sounds MSK no focal spinal tenderness, no joint edema Neuro: non-focal, well-oriented, appropriate affect Breasts: Deferred   Lab Results  Component Value Date   WBC 5.2 04/08/2020   HGB 9.6 (L) 04/08/2020   HCT 30.6 (L) 04/08/2020   MCV 90.0 04/08/2020   PLT 370 04/08/2020   Lab Results  Component Value Date   FERRITIN 464 (H) 02/29/2020   IRON 51 02/29/2020   TIBC 246 02/29/2020   UIBC 195 02/29/2020   IRONPCTSAT 21 02/29/2020   Lab Results  Component Value Date   RETICCTPCT 1.7 04/08/2020   RBC 3.40 (L) 04/08/2020   No results found for: KPAFRELGTCHN, LAMBDASER, KAPLAMBRATIO No results found for: IGGSERUM, IGA, IGMSERUM No results found for: Odetta Pink, SPEI   Chemistry      Component Value Date/Time   NA 139 03/14/2020 1120   NA 141 02/06/2019 1210   K 4.1 03/14/2020 1120   CL 103 03/14/2020 1120   CO2 30 03/14/2020 1120   BUN 21 (H) 03/14/2020 1120   BUN 8 02/06/2019 1210   CREATININE 1.14 03/14/2020 1120   CREATININE 0.85 02/29/2020 1230      Component Value Date/Time   CALCIUM 10.0 03/14/2020 1120   ALKPHOS 92 03/14/2020 1120   AST 20 03/14/2020 1120   AST 15 02/29/2020 1230   ALT 19 03/14/2020 1120   ALT 15 02/29/2020 1230   BILITOT 0.5 03/14/2020 1120   BILITOT 0.3 02/29/2020 1230       Impression and Plan: [name] is a very pleasant 45 yo caucasian gentleman with an unusual  metastatic Hurthle cell tumor of the thyroid, RET mutated. I spoke with Dr.  Marin Olp regarding the patients fatigue, dizziness and tachycardia as well as his persistent pain in the right abdomen and left hip/knee.  We will hold treatment today and give him fluids as well as IV iron. Hgb is down  At 9.6, MCV 90.  We will plan to resume treatment next week and repeat scans 2-3 weeks after that.  Will increase his long acting Oxycontin to 30 mg PO TID per MD.  Patient in agreement with the plan.  He was encouraged to contact our office with any questions or concerns.   Laverna Peace, NP 2/21/202212:52 PM

## 2020-04-08 NOTE — Patient Instructions (Signed)
Dehydration, Adult Dehydration is condition in which there is not enough water or other fluids in the body. This happens when a person loses more fluids than he or she takes in. Important body parts cannot work right without the right amount of fluids. Any loss of fluids from the body can cause dehydration. Dehydration can be mild, worse, or very bad. It should be treated right away to keep it from getting very bad. What are the causes? This condition may be caused by:  Conditions that cause loss of water or other fluids, such as: ? Watery poop (diarrhea). ? Vomiting. ? Sweating a lot. ? Peeing (urinating) a lot.  Not drinking enough fluids, especially when you: ? Are ill. ? Are doing things that take a lot of energy to do.  Other illnesses and conditions, such as fever or infection.  Certain medicines, such as medicines that take extra fluid out of the body (diuretics).  Lack of safe drinking water.  Not being able to get enough water and food. What increases the risk? The following factors may make you more likely to develop this condition:  Having a long-term (chronic) illness that has not been treated the right way, such as: ? Diabetes. ? Heart disease. ? Kidney disease.  Being 65 years of age or older.  Having a disability.  Living in a place that is high above the ground or sea (high in altitude). The thinner, dried air causes more fluid loss.  Doing exercises that put stress on your body for a long time. What are the signs or symptoms? Symptoms of dehydration depend on how bad it is. Mild or worse dehydration  Thirst.  Dry lips or dry mouth.  Feeling dizzy or light-headed, especially when you stand up from sitting.  Muscle cramps.  Your body making: ? Dark pee (urine). Pee may be the color of tea. ? Less pee than normal. ? Less tears than normal.  Headache. Very bad dehydration  Changes in skin. Skin may: ? Be cold to the touch (clammy). ? Be blotchy  or pale. ? Not go back to normal right after you lightly pinch it and let it go.  Little or no tears, pee, or sweat.  Changes in vital signs, such as: ? Fast breathing. ? Low blood pressure. ? Weak pulse. ? Pulse that is more than 100 beats a minute when you are sitting still.  Other changes, such as: ? Feeling very thirsty. ? Eyes that look hollow (sunken). ? Cold hands and feet. ? Being mixed up (confused). ? Being very tired (lethargic) or having trouble waking from sleep. ? Short-term weight loss. ? Loss of consciousness. How is this treated? Treatment for this condition depends on how bad it is. Treatment should start right away. Do not wait until your condition gets very bad. Very bad dehydration is an emergency. You will need to go to a hospital.  Mild or worse dehydration can be treated at home. You may be asked to: ? Drink more fluids. ? Drink an oral rehydration solution (ORS). This drink helps get the right amounts of fluids and salts and minerals in the blood (electrolytes).  Very bad dehydration can be treated: ? With fluids through an IV tube. ? By getting normal levels of salts and minerals in your blood. This is often done by giving salts and minerals through a tube. The tube is passed through your nose and into your stomach. ? By treating the root cause. Follow these instructions at   home: Oral rehydration solution If told by your doctor, drink an ORS:  Make an ORS. Use instructions on the package.  Start by drinking small amounts, about  cup (120 mL) every 5-10 minutes.  Slowly drink more until you have had the amount that your doctor said to have. Eating and drinking  Drink enough clear fluid to keep your pee pale yellow. If you were told to drink an ORS, finish the ORS first. Then, start slowly drinking other clear fluids. Drink fluids such as: ? Water. Do not drink only water. Doing that can make the salt (sodium) level in your body get too low. ? Water  from ice chips you suck on. ? Fruit juice that you have added water to (diluted). ? Low-calorie sports drinks.  Eat foods that have the right amounts of salts and minerals, such as: ? Bananas. ? Oranges. ? Potatoes. ? Tomatoes. ? Spinach.  Do not drink alcohol.  Avoid: ? Drinks that have a lot of sugar. These include:  High-calorie sports drinks.  Fruit juice that you did not add water to.  Soda.  Caffeine. ? Foods that are greasy or have a lot of fat or sugar.         General instructions  Take over-the-counter and prescription medicines only as told by your doctor.  Do not take salt tablets. Doing that can make the salt level in your body get too high.  Return to your normal activities as told by your doctor. Ask your doctor what activities are safe for you.  Keep all follow-up visits as told by your doctor. This is important. Contact a doctor if:  You have pain in your belly (abdomen) and the pain: ? Gets worse. ? Stays in one place.  You have a rash.  You have a stiff neck.  You get angry or annoyed (irritable) more easily than normal.  You are more tired or have a harder time waking than normal.  You feel: ? Weak or dizzy. ? Very thirsty. Get help right away if you have:  Any symptoms of very bad dehydration.  Symptoms of vomiting, such as: ? You cannot eat or drink without vomiting. ? Your vomiting gets worse or does not go away. ? Your vomit has blood or green stuff in it.  Symptoms that get worse with treatment.  A fever.  A very bad headache.  Problems with peeing or pooping (having a bowel movement), such as: ? Watery poop that gets worse or does not go away. ? Blood in your poop (stool). This may cause poop to look black and tarry. ? Not peeing in 6-8 hours. ? Peeing only a small amount of very dark pee in 6-8 hours.  Trouble breathing. These symptoms may be an emergency. Do not wait to see if the symptoms will go away. Get  medical help right away. Call your local emergency services (911 in the [place] Do not drive yourself to the hospital. Summary  Dehydration is a condition in which there is not enough water or other fluids in the body. This happens when a person loses more fluids than he or she takes in.  Treatment for this condition depends on how bad it is. Treatment should be started right away. Do not wait until your condition gets very bad.  Drink enough clear fluid to keep your pee pale yellow. If you were told to drink an oral rehydration solution (ORS), finish the ORS first. Then, start slowly drinking other clear fluids.    Take over-the-counter and prescription medicines only as told by your doctor.  Get help right away if you have any symptoms of very bad dehydration. This information is not intended to replace advice given to you by your health care provider. Make sure you discuss any questions you have with your health care provider. Document Revised: 09/15/2018 Document Reviewed: 09/15/2018 Elsevier Patient Education  2021 Elsevier Inc.  

## 2020-04-09 ENCOUNTER — Telehealth: Payer: Self-pay

## 2020-04-09 ENCOUNTER — Other Ambulatory Visit: Payer: Self-pay | Admitting: Family

## 2020-04-09 DIAGNOSIS — D509 Iron deficiency anemia, unspecified: Secondary | ICD-10-CM | POA: Insufficient documentation

## 2020-04-09 LAB — FERRITIN: Ferritin: 717 ng/mL — ABNORMAL HIGH (ref 24–336)

## 2020-04-09 LAB — IRON AND TIBC
Iron: 21 ug/dL — ABNORMAL LOW (ref 42–163)
Saturation Ratios: 10 % — ABNORMAL LOW (ref 20–55)
TIBC: 204 ug/dL (ref 202–409)
UIBC: 183 ug/dL (ref 117–376)

## 2020-04-09 NOTE — Telephone Encounter (Signed)
s/w pt per 04/08/20 los and he is aware of his appts, pt req to skip md/pa visit next week and per sarah this is ok.  I have scheduled the next chemo and he is also aware of this       Brian [name]

## 2020-04-11 ENCOUNTER — Encounter (HOSPITAL_COMMUNITY): Payer: Self-pay

## 2020-04-11 ENCOUNTER — Other Ambulatory Visit: Payer: Self-pay

## 2020-04-11 ENCOUNTER — Other Ambulatory Visit: Payer: 59

## 2020-04-11 ENCOUNTER — Inpatient Hospital Stay (HOSPITAL_COMMUNITY)
Admission: EM | Admit: 2020-04-11 | Discharge: 2020-04-15 | DRG: 948 | Disposition: A | Discharge status: Home / Self Care | Payer: 59 | Attending: Internal Medicine | Admitting: Internal Medicine

## 2020-04-11 ENCOUNTER — Ambulatory Visit: Payer: 59

## 2020-04-11 ENCOUNTER — Ambulatory Visit: Payer: 59 | Admitting: Hematology & Oncology

## 2020-04-11 ENCOUNTER — Emergency Department (HOSPITAL_COMMUNITY): Payer: 59

## 2020-04-11 ENCOUNTER — Telehealth: Payer: Self-pay | Admitting: *Deleted

## 2020-04-11 DIAGNOSIS — F419 Anxiety disorder, unspecified: Secondary | ICD-10-CM | POA: Diagnosis present

## 2020-04-11 DIAGNOSIS — E039 Hypothyroidism, unspecified: Secondary | ICD-10-CM | POA: Diagnosis present

## 2020-04-11 DIAGNOSIS — I10 Essential (primary) hypertension: Secondary | ICD-10-CM

## 2020-04-11 DIAGNOSIS — Z6841 Body Mass Index (BMI) 40.0 and over, adult: Secondary | ICD-10-CM

## 2020-04-11 DIAGNOSIS — I110 Hypertensive heart disease with heart failure: Secondary | ICD-10-CM | POA: Diagnosis present

## 2020-04-11 DIAGNOSIS — T380X5A Adverse effect of glucocorticoids and synthetic analogues, initial encounter: Secondary | ICD-10-CM | POA: Diagnosis not present

## 2020-04-11 DIAGNOSIS — Z9049 Acquired absence of other specified parts of digestive tract: Secondary | ICD-10-CM | POA: Diagnosis not present

## 2020-04-11 DIAGNOSIS — C799 Secondary malignant neoplasm of unspecified site: Secondary | ICD-10-CM

## 2020-04-11 DIAGNOSIS — R509 Fever, unspecified: Secondary | ICD-10-CM | POA: Diagnosis not present

## 2020-04-11 DIAGNOSIS — I5032 Chronic diastolic (congestive) heart failure: Secondary | ICD-10-CM | POA: Diagnosis present

## 2020-04-11 DIAGNOSIS — C772 Secondary and unspecified malignant neoplasm of intra-abdominal lymph nodes: Secondary | ICD-10-CM | POA: Diagnosis not present

## 2020-04-11 DIAGNOSIS — I2602 Saddle embolus of pulmonary artery with acute cor pulmonale: Secondary | ICD-10-CM | POA: Diagnosis not present

## 2020-04-11 DIAGNOSIS — D72828 Other elevated white blood cell count: Secondary | ICD-10-CM | POA: Diagnosis not present

## 2020-04-11 DIAGNOSIS — C787 Secondary malignant neoplasm of liver and intrahepatic bile duct: Secondary | ICD-10-CM | POA: Diagnosis not present

## 2020-04-11 DIAGNOSIS — I2782 Chronic pulmonary embolism: Secondary | ICD-10-CM

## 2020-04-11 DIAGNOSIS — G893 Neoplasm related pain (acute) (chronic): Secondary | ICD-10-CM | POA: Diagnosis not present

## 2020-04-11 DIAGNOSIS — R52 Pain, unspecified: Secondary | ICD-10-CM

## 2020-04-11 DIAGNOSIS — Z86718 Personal history of other venous thrombosis and embolism: Secondary | ICD-10-CM

## 2020-04-11 DIAGNOSIS — M7989 Other specified soft tissue disorders: Secondary | ICD-10-CM | POA: Diagnosis not present

## 2020-04-11 DIAGNOSIS — Z7901 Long term (current) use of anticoagulants: Secondary | ICD-10-CM

## 2020-04-11 DIAGNOSIS — Z8616 Personal history of COVID-19: Secondary | ICD-10-CM

## 2020-04-11 DIAGNOSIS — Z79890 Hormone replacement therapy (postmenopausal): Secondary | ICD-10-CM | POA: Diagnosis not present

## 2020-04-11 DIAGNOSIS — F32A Depression, unspecified: Secondary | ICD-10-CM | POA: Diagnosis present

## 2020-04-11 DIAGNOSIS — Z9221 Personal history of antineoplastic chemotherapy: Secondary | ICD-10-CM | POA: Diagnosis not present

## 2020-04-11 DIAGNOSIS — I2699 Other pulmonary embolism without acute cor pulmonale: Secondary | ICD-10-CM | POA: Diagnosis not present

## 2020-04-11 DIAGNOSIS — Z79899 Other long term (current) drug therapy: Secondary | ICD-10-CM

## 2020-04-11 DIAGNOSIS — R109 Unspecified abdominal pain: Secondary | ICD-10-CM | POA: Diagnosis not present

## 2020-04-11 DIAGNOSIS — Z86711 Personal history of pulmonary embolism: Secondary | ICD-10-CM | POA: Diagnosis not present

## 2020-04-11 DIAGNOSIS — K219 Gastro-esophageal reflux disease without esophagitis: Secondary | ICD-10-CM | POA: Diagnosis present

## 2020-04-11 DIAGNOSIS — C73 Malignant neoplasm of thyroid gland: Secondary | ICD-10-CM | POA: Diagnosis not present

## 2020-04-11 DIAGNOSIS — D500 Iron deficiency anemia secondary to blood loss (chronic): Secondary | ICD-10-CM

## 2020-04-11 HISTORY — DX: Chronic diastolic (congestive) heart failure: I50.32

## 2020-04-11 LAB — CBC
HCT: 30.4 % — ABNORMAL LOW (ref 39.0–52.0)
Hemoglobin: 9.3 g/dL — ABNORMAL LOW (ref 13.0–17.0)
MCH: 28.5 pg (ref 26.0–34.0)
MCHC: 30.6 g/dL (ref 30.0–36.0)
MCV: 93.3 fL (ref 80.0–100.0)
Platelets: 445 10*3/uL — ABNORMAL HIGH (ref 150–400)
RBC: 3.26 MIL/uL — ABNORMAL LOW (ref 4.22–5.81)
RDW: 17.9 % — ABNORMAL HIGH (ref 11.5–15.5)
WBC: 6.7 10*3/uL (ref 4.0–10.5)
nRBC: 0 % (ref 0.0–0.2)

## 2020-04-11 LAB — COMPREHENSIVE METABOLIC PANEL
ALT: 20 U/L (ref 0–44)
AST: 26 U/L (ref 15–41)
Albumin: 3.7 g/dL (ref 3.5–5.0)
Alkaline Phosphatase: 118 U/L (ref 38–126)
Anion gap: 11 (ref 5–15)
BUN: 15 mg/dL (ref 6–20)
CO2: 25 mmol/L (ref 22–32)
Calcium: 9.1 mg/dL (ref 8.9–10.3)
Chloride: 99 mmol/L (ref 98–111)
Creatinine, Ser: 0.99 mg/dL (ref 0.61–1.24)
GFR, Estimated: >60 mL/min (ref >60)
Glucose, Bld: 97 mg/dL (ref 70–99)
Potassium: 3.8 mmol/L (ref 3.5–5.1)
Sodium: 135 mmol/L (ref 135–145)
Total Bilirubin: 1 mg/dL (ref 0.3–1.2)
Total Protein: 6.7 g/dL (ref 6.5–8.1)

## 2020-04-11 LAB — AMMONIA: Ammonia: <9 umol/L — ABNORMAL LOW (ref 9–35)

## 2020-04-11 LAB — LIPASE, BLOOD: Lipase: 22 U/L (ref 11–51)

## 2020-04-11 MED ORDER — IOHEXOL 300 MG/ML  SOLN
100.0000 mL | Freq: Once | INTRAMUSCULAR | Status: AC | PRN
  Administered 2020-04-11: 100 mL via INTRAVENOUS

## 2020-04-11 MED ORDER — OXYCODONE HCL ER 10 MG PO T12A: 20.0000 mg | EXTENDED_RELEASE_TABLET | Freq: Two times a day (BID) | ORAL | Status: DC

## 2020-04-11 MED ORDER — HYDROMORPHONE HCL 1 MG/ML IJ SOLN
1.0000 mg | Freq: Once | INTRAMUSCULAR | Status: AC
  Administered 2020-04-11: 1 mg via INTRAVENOUS
  Filled 2020-04-11: qty 1

## 2020-04-11 MED ORDER — OXYCODONE HCL 5 MG PO TABS
10.0000 mg | ORAL_TABLET | Freq: Four times a day (QID) | ORAL | Status: DC | PRN
  Administered 2020-04-11: 10 mg via ORAL
  Filled 2020-04-11: qty 2

## 2020-04-11 MED ORDER — OXYCODONE HCL ER 15 MG PO T12A
30.0000 mg | EXTENDED_RELEASE_TABLET | Freq: Three times a day (TID) | ORAL | Status: DC
  Administered 2020-04-12: 30 mg via ORAL
  Filled 2020-04-11: qty 2

## 2020-04-11 MED ORDER — APIXABAN 5 MG PO TABS
5.0000 mg | ORAL_TABLET | Freq: Two times a day (BID) | ORAL | Status: DC
  Administered 2020-04-11 – 2020-04-15 (×8): 5 mg via ORAL
  Filled 2020-04-11 (×8): qty 1

## 2020-04-11 MED ORDER — GABAPENTIN 300 MG PO CAPS
300.0000 mg | ORAL_CAPSULE | Freq: Three times a day (TID) | ORAL | Status: DC
  Administered 2020-04-11 – 2020-04-15 (×11): 300 mg via ORAL
  Filled 2020-04-11 (×11): qty 1

## 2020-04-11 MED ORDER — SENNOSIDES-DOCUSATE SODIUM 8.6-50 MG PO TABS: 1.0000 | ORAL_TABLET | Freq: Every evening | ORAL | Status: DC | PRN

## 2020-04-11 MED ORDER — LACTATED RINGERS IV BOLUS
1000.0000 mL | Freq: Once | INTRAVENOUS | Status: AC
  Administered 2020-04-11: 1000 mL via INTRAVENOUS

## 2020-04-11 MED ORDER — HYDROMORPHONE HCL 1 MG/ML IJ SOLN: 1.0000 mg | INTRAMUSCULAR | Status: DC | PRN

## 2020-04-11 MED ORDER — HYDROMORPHONE HCL 1 MG/ML IJ SOLN
1.0000 mg | Freq: Once | INTRAMUSCULAR | Status: AC
Start: 2020-04-11 — End: 2020-04-11
  Administered 2020-04-11: 1 mg via INTRAVENOUS
  Filled 2020-04-11: qty 1

## 2020-04-11 MED ORDER — PROCHLORPERAZINE EDISYLATE 10 MG/2ML IJ SOLN
10.0000 mg | Freq: Four times a day (QID) | INTRAMUSCULAR | Status: DC | PRN
  Administered 2020-04-11: 10 mg via INTRAVENOUS
  Filled 2020-04-11: qty 2

## 2020-04-11 MED ORDER — ACETAMINOPHEN 650 MG RE SUPP: 650.0000 mg | Freq: Four times a day (QID) | RECTAL | Status: DC | PRN

## 2020-04-11 MED ORDER — BISACODYL 5 MG PO TBEC: 5.0000 mg | DELAYED_RELEASE_TABLET | Freq: Every day | ORAL | Status: DC | PRN

## 2020-04-11 MED ORDER — ONDANSETRON HCL 4 MG/2ML IJ SOLN: 4.0000 mg | Freq: Four times a day (QID) | INTRAMUSCULAR | Status: DC | PRN

## 2020-04-11 MED ORDER — ACETAMINOPHEN 325 MG PO TABS
650.0000 mg | ORAL_TABLET | Freq: Four times a day (QID) | ORAL | Status: DC | PRN
  Administered 2020-04-15: 650 mg via ORAL
  Filled 2020-04-11: qty 2

## 2020-04-11 MED ORDER — ONDANSETRON HCL 4 MG/2ML IJ SOLN
4.0000 mg | Freq: Once | INTRAMUSCULAR | Status: AC
  Administered 2020-04-11: 4 mg via INTRAVENOUS
  Filled 2020-04-11: qty 2

## 2020-04-11 MED ORDER — OXYCODONE HCL ER 10 MG PO T12A: 30.0000 mg | EXTENDED_RELEASE_TABLET | Freq: Three times a day (TID) | ORAL | Status: DC

## 2020-04-11 MED ORDER — ONDANSETRON HCL 4 MG PO TABS: 4.0000 mg | ORAL_TABLET | Freq: Four times a day (QID) | ORAL | Status: DC | PRN

## 2020-04-11 MED ORDER — HYDRALAZINE HCL 50 MG PO TABS: 50.0000 mg | ORAL_TABLET | Freq: Four times a day (QID) | ORAL | Status: DC | PRN

## 2020-04-11 MED ORDER — HYDROMORPHONE HCL 1 MG/ML IJ SOLN
1.0000 mg | INTRAMUSCULAR | Status: DC | PRN
  Administered 2020-04-11: 1 mg via INTRAVENOUS
  Filled 2020-04-11: qty 1

## 2020-04-11 NOTE — ED Notes (Signed)
RN is aware of patient vitals

## 2020-04-11 NOTE — Telephone Encounter (Signed)
Call received from patient stating that he is "feeling horrible, is in terrible pain despite taking pain medicine and is weak." Pt instructed to go to the [facility] now per order of Dr. Marin Olp. Pt states that he will go to the [facility] ER now.

## 2020-04-11 NOTE — H&P (Signed)
History and Physical    Brian [name] VEL:[id number] DOB: [birth year] DOA: 04/11/2020  PCP: Brian Richters, MD   Patient coming from: Home   Chief Complaint: Abdominal pain, nausea   HPI: Brian [name] is a 45 y.o. male with medical history significant for thyroid cancer with liver metastases, history of DVT and PE on Eliquis, hypertension, and chronic diastolic CHF, now presenting to emergency department for evaluation of severe abdominal pain.  Patient reports chronic right-sided abdominal pain related to his cancer but has had worsening pain in recent days to weeks, now becoming severe despite recent increase in his long-acting oxycodone and continue to use her short acting analgesics at home.  Pain is similar in character and location to his chronic pain but severity has increased.  He has had nausea which has been a chronic problem for him, but no vomiting or diarrhea.  He had a fever last week at home but denies any cough, shortness of breath, or dysuria.  He was diagnosed with COVID-19 1 month ago, had an outpatient infusion, had only mild symptoms, and has completely recovered from that.  He reports that his chemotherapy was interrupted due to his Covid diagnosis and then due to some dizziness and fatigue which has since improved.  He denies any melena or hematochezia.  ED Course: Upon arrival to the ED, patient is found to be afebrile, saturating well on room air, and with blood pressure 170/110.  CMP is unremarkable and CBC notable for mild thrombocytosis and a normocytic anemia with hemoglobin 9.3, down from 9.6 a few days earlier.  CT of the abdomen and pelvis demonstrates interval enlargement and liver metastases and enlarging portacaval node.  Patient was given multiple doses of IV Dilaudid in the ED which helped for a little while but he continues to have severe pain and hospitalists are consulted for admission.  Review of Systems:  All other systems reviewed and apart from HPI, are  negative.  Past Medical History:  Diagnosis Date  . Chronic diastolic CHF (congestive heart failure) ([place]) 04/11/2020  . DVT, lower extremity, distal, acute, left (Brian [name]) 01/14/2020  . Essential hypertension 09/09/2018  . Goals of care, counseling/discussion 01/14/2020  . Kidney stone   . Primary cancer of thyroid with metastasis to other site (Brian [name]) 05/04/2018  . Rickettsia infection     Past Surgical History:  Procedure Laterality Date  . BIOPSY  04/05/2018   Procedure: BIOPSY;  Surgeon: Brian Lobo, MD;  Location: [facility];  Service: Endoscopy;;  . CHOLECYSTECTOMY    . ESOPHAGOGASTRODUODENOSCOPY (EGD) WITH PROPOFOL N/A 04/05/2018   Procedure: ESOPHAGOGASTRODUODENOSCOPY (EGD) WITH PROPOFOL;  Surgeon: Brian Lobo, MD;  Location: [facility];  Service: Endoscopy;  Laterality: N/A;  . IR IMAGING GUIDED PORT INSERTION  09/13/2019  . IR RADIOLOGIST EVAL & MGMT  10/11/2018    Social History:   reports that he has never smoked. He has never used smokeless tobacco. He reports that he does not drink alcohol and does not use drugs.  Allergies  Allergen Reactions  . Dextromethorphan-Guaifenesin Other (See Comments)    Irregular heartbeat   . Doxycycline Anaphylaxis, Nausea And Vomiting, Rash and Other (See Comments)    "heart arrythmia" and "dyspepsia" (only oral doxycycline causes reaction)  . Gadobutrol Hives and Other (See Comments)    Patient had MRI scan at [facility]. Patient called one hour after he left imaging facility to report two "blisters" that came up on "back" of lip.    . Gadolinium Derivatives Hives  Patient had MRI scan at [facility]. Patient called one hour after he left imaging facility to report two "blisters" that came up on "back" of lip.   . Guaifenesin Palpitations       . Ibuprofen Hives and Itching  . Lisinopril Other (See Comments)    Angioedema  . Pantoprazole Itching  . Tramadol Hives and Itching  . Barium Rash    Developed  redness around neck after drinking 1st bottle of Barocat; pt was given Benedryl by ED Mds to be "on the safe side" before drinking 2nd bottle  . Chlorhexidine Hives    Family History  Problem Relation Age of Onset  . Diabetes Mother   . Heart disease Mother   . Heart failure Mother   . Heart attack Mother   . Hyperlipidemia Mother   . Hypertension Mother   . Hyperlipidemia Father   . Allergic rhinitis Father   . Rashes / Skin problems Father   . Sudden death Neg Hx   . Thyroid disease Neg Hx      Prior to Admission medications   Medication Sig Start Date End Date Taking? Authorizing Provider  apixaban (ELIQUIS) 5 MG TABS tablet Take 1 tablet (5 mg total) by mouth 2 (two) times daily. 01/25/20   Brian Napoleon, MD  cloNIDine (CATAPRES) 0.1 MG tablet Take 0.1 mg by mouth 2 (two) times daily as needed (high blood pressure).  09/06/19   [provider]  EPINEPHrine (EPIPEN 2-PAK) 0.3 mg/0.3 mL IJ SOAJ injection USE AS DIRECTED FOR LIFE THREATENING ALLERGIC REACTIONS Patient taking differently: Inject 0.3 mg into the muscle as needed (life threatening allergic reaction). 04/30/15   Kozlow, Donnamarie Poag, MD  fluconazole (DIFLUCAN) 100 MG tablet Take 1 tablet (100 mg total) by mouth daily. 02/05/20   Brian Napoleon, MD  furosemide (LASIX) 80 MG tablet TAKE 1 TABLET (80 MG TOTAL) BY MOUTH DAILY. Patient taking differently: Take 80 mg by mouth daily as needed for fluid. 11/24/19   Brian Napoleon, MD  gabapentin (NEURONTIN) 300 MG capsule TAKE 1 CAPSULE (300 MG TOTAL) BY MOUTH 3 (THREE) TIMES DAILY. 11/13/19   Brian Napoleon, MD  levothyroxine (SYNTHROID) 200 MCG tablet Take 1 tablet (200 mcg total) by mouth daily. Take along with 88 mcg=288 mcg 02/13/20   Brian Poisson, MD  levothyroxine (SYNTHROID) 75 MCG tablet Take 1 tablet (75 mcg total) by mouth daily before breakfast. 02/14/20   Brian Poisson, MD  lidocaine-prilocaine (EMLA) cream Apply to affected area once 12/05/19   Ennever,  Rudell Cobb, MD  LORazepam (ATIVAN) 0.5 MG tablet TAKE 1 TABLET BY MOUTH EVERY 6 HOURS AS NEEDED FOR NAUSEA & VOMITING 03/29/20   [place], Holli Humbles, NP  losartan (COZAAR) 25 MG tablet Take 1 tablet (25 mg total) by mouth 2 (two) times daily. Patient taking differently: Take 25 mg by mouth 2 (two) times daily as needed (high blood pressure). 03/02/19   Park Liter, MD  magic mouthwash SOLN Take 10 mLs by mouth 4 (four) times daily as needed for mouth pain. 10/06/19   Brian Napoleon, MD  methylphenidate (RITALIN) 10 MG tablet TAKE 1 TABLET (10 MG TOTAL) BY MOUTH 2 (TWO) TIMES DAILY. 03/12/20   Brian Napoleon, MD  metoCLOPramide (REGLAN) 10 MG tablet TAKE 1 TABLET BY MOUTH FOUR TIMES DAILY BEFORE MEALS AND AT BEDTIME Patient taking differently: Take 10 mg by mouth every 6 (six) hours as needed for nausea or vomiting. 06/01/19   Ennever,  Rudell Cobb, MD  metolazone (ZAROXOLYN) 5 MG tablet TAKE 1 TABLET (5 MG TOTAL) BY MOUTH DAILY. TAKE 1 HOUR BEFORE LASIX. Patient taking differently: Take 5 mg by mouth daily as needed (1 hour before taking Lasix). 11/24/19   Brian Napoleon, MD  Multiple Vitamin (MULTIVITAMIN) capsule Take 1 capsule by mouth daily.    [provider]  nystatin (MYCOSTATIN) 100000 UNIT/ML suspension Take 5 mLs (500,000 Units total) by mouth 4 (four) times daily. 02/13/20   Brian Poisson, MD  OLANZapine (ZYPREXA) 10 MG tablet Take 10 mg by mouth at bedtime. 01/18/20   [provider]  oxyCODONE 30 MG 12 hr tablet Take 1 tablet (30 mg total) by mouth every 8 (eight) hours. 04/08/20   [place], Holli Humbles, NP  Oxycodone HCl 10 MG TABS TAKE 1 TABLET BY MOUTH EVERY 6 HOURS AS NEEDED 03/05/20   Brian Napoleon, MD  potassium chloride (KLOR-CON) 10 MEQ tablet TAKE 1 TABLET BY MOUTH ONCE DAILY 03/12/20   Brian Napoleon, MD  prochlorperazine (COMPAZINE) 10 MG tablet Take 1 tablet (10 mg total) by mouth every 6 (six) hours as needed (Nausea or vomiting). 12/05/19   Brian Napoleon, MD  sucralfate (CARAFATE) 1 GM/10ML suspension Take 10 mLs (1 g total) by mouth 4 (four) times daily -  with meals and at bedtime. Patient taking differently: Take 1 g by mouth 4 (four) times daily as needed (prevent ulcers). 10/05/19   Brian Napoleon, MD    Physical Exam: Vitals:   04/11/20 2130 04/11/20 2200 04/11/20 2230 04/11/20 2300  BP: (!) 131/93 (!) 151/97 (!) 140/99 (!) 140/95  Pulse: (!) 102 (!) 106 (!) 103 (!) 104  Resp: 20 (!) 23 20 (!) 24  Temp:      TempSrc:      SpO2: 100% 100% 99% 100%  Weight:      Height:        Constitutional: NAD, calm  Eyes: PERTLA, lids and conjunctivae normal ENMT: Mucous membranes are moist. Posterior pharynx clear of any exudate or lesions.   Neck: normal, supple, no masses, no thyromegaly Respiratory:  no wheezing, no crackles. No accessory muscle use.  Cardiovascular: S1 & S2 heard, regular rate and rhythm. Mild lower leg edema bilaterally.   Abdomen: No distension, soft, tender on right without rebound pain or guarding. Bowel sounds active.  Musculoskeletal: no clubbing / cyanosis. No joint deformity upper and lower extremities.   Skin: no significant rashes, lesions, ulcers. Warm, dry, well-perfused. Neurologic: No facial asymmetry. Sensation intact. Moving all extremities.  Psychiatric: Alert and oriented to person, place, and situation. Very pleasant and cooperative.    Labs and Imaging on Admission: I have personally reviewed following labs and imaging studies  CBC: Recent Labs  Lab 04/08/20 1242 04/11/20 1705  WBC 5.2 6.7  NEUTROABS 3.8  --   HGB 9.6* 9.3*  HCT 30.6* 30.4*  MCV 90.0 93.3  PLT 370 242*   Basic Metabolic Panel: Recent Labs  Lab 04/08/20 1242 04/11/20 1705  NA 137 135  K 4.2 3.8  CL 101 99  CO2 28 25  GLUCOSE 102* 97  BUN 18 15  CREATININE 0.96 0.99  CALCIUM 9.8 9.1   GFR: Estimated Creatinine Clearance: 129.8 mL/min (by C-G formula based on SCr of 0.99 mg/dL). Liver Function  Tests: Recent Labs  Lab 04/08/20 1242 04/11/20 1705  AST 23 26  ALT 22 20  ALKPHOS 126 118  BILITOT 0.5 1.0  PROT 6.5 6.7  ALBUMIN 3.7 3.7   Recent Labs  Lab 04/11/20 1705  LIPASE 22   Recent Labs  Lab 04/11/20 1705  AMMONIA <9*   Coagulation Profile: No results for input(s): INR, PROTIME in the last 168 hours. Cardiac Enzymes: No results for input(s): CKTOTAL, CKMB, CKMBINDEX, TROPONINI in the last 168 hours. BNP (last 3 results) No results for input(s): PROBNP in the last 8760 hours. HbA1C: No results for input(s): HGBA1C in the last 72 hours. CBG: No results for input(s): GLUCAP in the last 168 hours. Lipid Profile: No results for input(s): CHOL, HDL, LDLCALC, TRIG, CHOLHDL, LDLDIRECT in the last 72 hours. Thyroid Function Tests: No results for input(s): TSH, T4TOTAL, FREET4, T3FREE, THYROIDAB in the last 72 hours. Anemia Panel: No results for input(s): VITAMINB12, FOLATE, FERRITIN, TIBC, IRON, RETICCTPCT in the last 72 hours. Urine analysis:    Component Value Date/Time   COLORURINE YELLOW 02/08/2020 1710   APPEARANCEUR CLEAR 02/08/2020 1710   LABSPEC 1.025 02/08/2020 1710   PHURINE 5.0 02/08/2020 1710   GLUCOSEU NEGATIVE 02/08/2020 1710   HGBUR NEGATIVE 02/08/2020 1710   BILIRUBINUR NEGATIVE 02/08/2020 1710   BILIRUBINUR negative 06/11/2013 1309   KETONESUR NEGATIVE 02/08/2020 1710   PROTEINUR NEGATIVE 02/08/2020 1710   UROBILINOGEN 0.2 06/12/2013 0149   NITRITE NEGATIVE 02/08/2020 1710   LEUKOCYTESUR NEGATIVE 02/08/2020 1710   Sepsis Labs: @LABRCNTIP (procalcitonin:4,lacticidven:4) )No results found for this or any previous visit (from the past 240 hour(s)).   Radiological Exams on Admission: CT ABDOMEN PELVIS W CONTRAST  Result Date: 04/11/2020 CLINICAL DATA:  Right-sided abdominal pain. EXAM: CT ABDOMEN AND PELVIS WITH CONTRAST TECHNIQUE: Multidetector CT imaging of the abdomen and pelvis was performed using the standard protocol following bolus  administration of intravenous contrast. CONTRAST:  166mL OMNIPAQUE IOHEXOL 300 MG/ML  SOLN COMPARISON:  February 08, 2020 FINDINGS: Lower chest: Mild areas of atelectasis are seen within the anterior aspect of the bilateral lung bases. Hepatobiliary: A 7.0 cm x 6.2 cm x 6.3 cm area of heterogeneous low attenuation is seen within the right lobe of the liver. This is increased in size when compared to the prior study. Status post cholecystectomy. No biliary dilatation. A 6.8 cm x 5.2 cm heterogeneous low-attenuation soft tissue mass is seen along the posterior aspect of the portacaval region. This is adjacent to the lateral aspect of the head of the pancreas and is increased in size when compared to the prior study. Pancreas: Unremarkable. No pancreatic ductal dilatation or surrounding inflammatory changes. Spleen: Normal in size without focal abnormality. Adrenals/Urinary Tract: Adrenal glands are unremarkable. Kidneys are normal, without renal calculi, focal lesion, or hydronephrosis. Bladder is unremarkable. Stomach/Bowel: Stomach is within normal limits. Appendix appears normal. No evidence of bowel wall thickening, distention, or inflammatory changes. Vascular/Lymphatic: Mild aortic atherosclerosis. No enlarged abdominal or pelvic lymph nodes. Reproductive: Prostate is unremarkable. Other: No abdominal wall hernia or abnormality. No abdominopelvic ascites. Musculoskeletal: No acute or significant osseous findings. IMPRESSION: 1. Interval increase in size of a metastatic liver lesion since the prior study. 2. Enlarging portacaval lymph node, as described above. 3. Status post cholecystectomy. 4. Mild aortic atherosclerosis. Aortic Atherosclerosis (ICD10-I70.0). Electronically Signed   By: Virgina Norfolk M.D.   On: 04/11/2020 19:41    Assessment/Plan   1. Intractable abdominal pain; chronic cancer-related pain  - Patient with chronic pain related to liver metastases presents with severe pain despite long-  and short-acting oxycodone at home and is found to have interval increase in size of metastases on CT  -  He reports brief relief after IV Dilaudid in ED but continues to complain of severe pain after multiple doses  - Continue long-acting oxycodone, continue IV Dilaudid for severe pain    2. Thyroid cancer with metastases  - Follows with [facility] and has been undergoing treatment with adriamycin but had missed recent infusions due to COVID and then dizziness and fatigue   - There has been interval increase in size of liver metastases and portacaval node on CT in ED, patient aware    3. History of PE  - No evidence for acute VTE  - Continue Eliquis    4. Anemia  - Hgb is 9.3 on admission  - Patient denies melena or hematochezia - Monitor, transfuse as needed   5. Depression, anxiety  - Seems to be stable  - Plan to continue home medications pending pharmacy medication reconciliation   6. Hypertension  - BP elevated in ED in setting of severe pain  - Continue pain-control, follow-up pharmacy medication-reconciliation    7. Chronic diastolic CHF  - Appears compensated, had preserved EF in December 2021, uses Lasix only as needed at home   - Monitor volume status, continue as-needed Lasix     DVT prophylaxis: Eliquis  Code Status: Full  Level of Care: Level of care: Med-Surg Family Communication: Wife updated at bedside Disposition Plan:  Patient is from: Home  Anticipated d/c is to: Home  Anticipated d/c date is: 2/25 or 04/13/20 Patient currently: Pending pain-control  Consults called: None  Admission status: Observation     Vianne Bulls, MD [facility]  04/11/2020, 11:21 PM

## 2020-04-11 NOTE — ED Provider Notes (Signed)
[place] DEPT Provider Note   CSN: [id number] Arrival date & time: 04/11/20  1617     History Chief Complaint  Patient presents with  . Cancer patient    Brian [name] is a 45 y.o. male.  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  Patient reports he has chronic right-sided abdominal pain from known liver mets but in the last week the pain has become significantly worse despite taking his regular pain medications.  Today the pain was more than he could bear.  He has been nauseated but denies any vomiting.  The pain is in the right back and in the right lower quadrant.  He has been urinating and having bowel movements without difficulty.  He denies fever, cough or shortness of breath.  He did see his doctor earlier this week and had blood work drawn which showed a drop in his hemoglobin from 10-9 but his LFTs and renal function were normal.  He denies any confusion, hallucinations or any other abrupt changes.  He spoke with Dr. Marin Olp today who recommended he come to the emergency room.  The history is provided by the patient.       Past Medical History:  Diagnosis Date  . DVT, lower extremity, distal, acute, left ([place]) 01/14/2020  . Essential hypertension 09/09/2018  . Goals of care, counseling/discussion 01/14/2020  . Kidney stone   . Primary cancer of thyroid with metastasis to other site ([place]) 05/04/2018  . Rickettsia infection     Patient Active Problem List   Diagnosis Date Noted  . IDA (iron deficiency anemia) 04/09/2020  . Obesity, Class III, BMI 40-49.9 (morbid obesity) ([place]) 02/09/2020  . Hypothyroidism 02/09/2020  . AKI (acute kidney injury) ([place]) 02/09/2020  . Sepsis ([place]) 02/08/2020  . Goals of care, counseling/discussion 01/14/2020  . DVT, lower extremity, distal, acute,  left ([place]) 01/14/2020  . Pulmonary embolism without acute cor pulmonale ([place]) 01/13/2020  . Liver metastases ([place]) 08/13/2019  . Essential hypertension 09/09/2018  . Metastasis from thyroid cancer ([place]) 07/29/2018  . Primary cancer of thyroid with metastasis to other site ([place]) 05/04/2018  . Extrasystole 04/26/2018  . Dyspnea on exertion 04/26/2018  . Atypical chest pain 04/26/2018  . Ureteral stone with hydronephrosis 02/15/2018  . Right thyroid nodule 04/09/2017  . Bilateral knee pain 07/01/2016  . Left foot pain 02/06/2016  . Gastroesophageal reflux disease without esophagitis 05/08/2014  . Thrombocytopenia, unspecified ([place]) 06/13/2013  . 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 Low back pain 05/12/2012  . Allergic rhinitis 06/03/2009    Past Surgical History:  Procedure Laterality Date  . BIOPSY  04/05/2018   Procedure: BIOPSY;  Surgeon: Ronald Lobo, MD;  Location: [facility]COPY;  Service: Endoscopy;;  . CHOLECYSTECTOMY    . ESOPHAGOGASTRODUODENOSCOPY (EGD) WITH PROPOFOL N/A 04/05/2018   Procedure: ESOPHAGOGASTRODUODENOSCOPY (EGD) WITH PROPOFOL;  Surgeon: Ronald Lobo, MD;  Location: [facility];  Service: Endoscopy;  Laterality: N/A;  . IR IMAGING GUIDED PORT INSERTION  09/13/2019  . IR RADIOLOGIST EVAL & MGMT  10/11/2018       Family History  Problem Relation Age of Onset  . Diabetes Mother   . Heart disease Mother   . Heart failure Mother   . Heart attack Mother   . Hyperlipidemia Mother   . Hypertension Mother   . Hyperlipidemia Father   . Allergic rhinitis Father   .  Rashes / Skin problems Father   . Sudden death Neg Hx   . Thyroid disease Neg Hx     Social History   Tobacco Use  . Smoking status: Never Smoker  . Smokeless tobacco: Never Used  Vaping Use  . Vaping Use: Never used  Substance Use Topics  . Alcohol use: No  . Drug use: No    Home Medications Prior to Admission medications   Medication Sig Start Date End Date Taking? Authorizing  Provider  apixaban (ELIQUIS) 5 MG TABS tablet Take 1 tablet (5 mg total) by mouth 2 (two) times daily. 01/25/20   [name], MD  cloNIDine (CATAPRES) 0.1 MG tablet Take 0.1 mg by mouth 2 (two) times daily as needed (high blood pressure).  09/06/19   [provider]  EPINEPHrine (EPIPEN 2-PAK) 0.3 mg/0.3 mL IJ SOAJ injection USE AS DIRECTED FOR LIFE THREATENING ALLERGIC REACTIONS Patient taking differently: Inject 0.3 mg into the muscle as needed (life threatening allergic reaction). 04/30/15   Kozlow, Donnamarie Poag, MD  fluconazole (DIFLUCAN) 100 MG tablet Take 1 tablet (100 mg total) by mouth daily. 02/05/20   [name], MD  furosemide (LASIX) 80 MG tablet TAKE 1 TABLET (80 MG TOTAL) BY MOUTH DAILY. Patient taking differently: Take 80 mg by mouth daily as needed for fluid. 11/24/19   [name], MD  gabapentin (NEURONTIN) 300 MG capsule TAKE 1 CAPSULE (300 MG TOTAL) BY MOUTH 3 (THREE) TIMES DAILY. 11/13/19   [name], MD  levothyroxine (SYNTHROID) 200 MCG tablet Take 1 tablet (200 mcg total) by mouth daily. Take along with 88 mcg=288 mcg 02/13/20   Hosie Poisson, MD  levothyroxine (SYNTHROID) 75 MCG tablet Take 1 tablet (75 mcg total) by mouth daily before breakfast. 02/14/20   Hosie Poisson, MD  lidocaine-prilocaine (EMLA) cream Apply to affected area once 12/05/19   Ennever, Rudell Cobb, MD  LORazepam (ATIVAN) 0.5 MG tablet TAKE 1 TABLET BY MOUTH EVERY 6 HOURS AS NEEDED FOR NAUSEA & VOMITING 03/29/20   [place], Holli Humbles, NP  losartan (COZAAR) 25 MG tablet Take 1 tablet (25 mg total) by mouth 2 (two) times daily. Patient taking differently: Take 25 mg by mouth 2 (two) times daily as needed (high blood pressure). 03/02/19   Park Liter, MD  magic mouthwash SOLN Take 10 mLs by mouth 4 (four) times daily as needed for mouth pain. 10/06/19   [name], MD  methylphenidate (RITALIN) 10 MG tablet TAKE 1 TABLET (10 MG TOTAL) BY MOUTH 2 (TWO) TIMES DAILY. 03/12/20    [name], MD  metoCLOPramide (REGLAN) 10 MG tablet TAKE 1 TABLET BY MOUTH FOUR TIMES DAILY BEFORE MEALS AND AT BEDTIME Patient taking differently: Take 10 mg by mouth every 6 (six) hours as needed for nausea or vomiting. 06/01/19   [name], MD  metolazone (ZAROXOLYN) 5 MG tablet TAKE 1 TABLET (5 MG TOTAL) BY MOUTH DAILY. TAKE 1 HOUR BEFORE LASIX. Patient taking differently: Take 5 mg by mouth daily as needed (1 hour before taking Lasix). 11/24/19   [name], MD  Multiple Vitamin (MULTIVITAMIN) capsule Take 1 capsule by mouth daily.    [provider]  nystatin (MYCOSTATIN) 100000 UNIT/ML suspension Take 5 mLs (500,000 Units total) by mouth 4 (four) times daily. 02/13/20   Hosie Poisson, MD  OLANZapine (ZYPREXA) 10 MG tablet Take 10 mg by mouth at bedtime. 01/18/20   [provider]  oxyCODONE 30 MG 12 hr tablet  Take 1 tablet (30 mg total) by mouth every 8 (eight) hours. 04/08/20   [place], Holli Humbles, NP  Oxycodone HCl 10 MG TABS TAKE 1 TABLET BY MOUTH EVERY 6 HOURS AS NEEDED 03/05/20   [name], MD  potassium chloride (KLOR-CON) 10 MEQ tablet TAKE 1 TABLET BY MOUTH ONCE DAILY 03/12/20   [name], MD  prochlorperazine (COMPAZINE) 10 MG tablet Take 1 tablet (10 mg total) by mouth every 6 (six) hours as needed (Nausea or vomiting). 12/05/19   [name], MD  sucralfate (CARAFATE) 1 GM/10ML suspension Take 10 mLs (1 g total) by mouth 4 (four) times daily -  with meals and at bedtime. Patient taking differently: Take 1 g by mouth 4 (four) times daily as needed (prevent ulcers). 10/05/19   [name], MD    Allergies    Dextromethorphan-guaifenesin, Doxycycline, Gadobutrol, Gadolinium derivatives, Guaifenesin, Ibuprofen, Lisinopril, Pantoprazole, Tramadol, Barium, and Chlorhexidine  Review of Systems   Review of Systems  All other systems reviewed and are negative.   Physical Exam Updated Vital Signs BP (!) 169/116 (BP Location:  Right Arm)   Pulse (!) 128   Temp 98.7 F (37.1 C) (Oral)   Resp 20   Ht 5\' 10"  (1.778 m)   Wt 131.5 kg   SpO2 98%   BMI 41.61 kg/m   Physical Exam Vitals and nursing note reviewed.  Constitutional:      General: He is not in acute distress.    Appearance: He is well-developed and well-nourished. He is ill-appearing.  HENT:     Head: Normocephalic and atraumatic.     Mouth/Throat:     Mouth: Oropharynx is clear and moist. Mucous membranes are moist.  Eyes:     Extraocular Movements: EOM normal.     Conjunctiva/sclera: Conjunctivae normal.     Pupils: Pupils are equal, round, and reactive to light.  Cardiovascular:     Rate and Rhythm: Regular rhythm. Tachycardia present.     Pulses: Normal pulses and intact distal pulses.     Heart sounds: No murmur heard.   Pulmonary:     Effort: Pulmonary effort is normal. No respiratory distress.     Breath sounds: Normal breath sounds. No wheezing or rales.  Chest:     Chest wall: No tenderness.  Abdominal:     General: There is no distension.     Palpations: Abdomen is soft.     Tenderness: There is abdominal tenderness in the right upper quadrant and right lower quadrant. There is right CVA tenderness and guarding. There is no rebound.  Musculoskeletal:        General: No tenderness or edema. Normal range of motion.     Cervical back: Normal range of motion and neck supple.  Skin:    General: Skin is warm and dry.     Coloration: Skin is pale.     Findings: No erythema or rash.  Neurological:     Mental Status: He is alert and oriented to person, place, and time.  Psychiatric:        Mood and Affect: Mood and affect normal.        Behavior: Behavior normal.     ED Results / Procedures / Treatments   Labs (all labs ordered are listed, but only abnormal results are displayed) Labs Reviewed  CBC - Abnormal; Notable for the following components:      Result Value   RBC 3.26 (*)    Hemoglobin 9.3 (*)  HCT 30.4 (*)     RDW 17.9 (*)    Platelets 445 (*)    All other components within normal limits  AMMONIA - Abnormal; Notable for the following components:   Ammonia <9 (*)    All other components within normal limits  LIPASE, BLOOD  COMPREHENSIVE METABOLIC PANEL  URINALYSIS, ROUTINE W REFLEX MICROSCOPIC    EKG None  Radiology CT ABDOMEN PELVIS W CONTRAST  Result Date: 04/11/2020 CLINICAL DATA:  Right-sided abdominal pain. EXAM: CT ABDOMEN AND PELVIS WITH CONTRAST TECHNIQUE: Multidetector CT imaging of the abdomen and pelvis was performed using the standard protocol following bolus administration of intravenous contrast. CONTRAST:  132mL OMNIPAQUE IOHEXOL 300 MG/ML  SOLN COMPARISON:  February 08, 2020 FINDINGS: Lower chest: Mild areas of atelectasis are seen within the anterior aspect of the bilateral lung bases. Hepatobiliary: A 7.0 cm x 6.2 cm x 6.3 cm area of heterogeneous low attenuation is seen within the right lobe of the liver. This is increased in size when compared to the prior study. Status post cholecystectomy. No biliary dilatation. A 6.8 cm x 5.2 cm heterogeneous low-attenuation soft tissue mass is seen along the posterior aspect of the portacaval region. This is adjacent to the lateral aspect of the head of the pancreas and is increased in size when compared to the prior study. Pancreas: Unremarkable. No pancreatic ductal dilatation or surrounding inflammatory changes. Spleen: Normal in size without focal abnormality. Adrenals/Urinary Tract: Adrenal glands are unremarkable. Kidneys are normal, without renal calculi, focal lesion, or hydronephrosis. Bladder is unremarkable. Stomach/Bowel: Stomach is within normal limits. Appendix appears normal. No evidence of bowel wall thickening, distention, or inflammatory changes. Vascular/Lymphatic: Mild aortic atherosclerosis. No enlarged abdominal or pelvic lymph nodes. Reproductive: Prostate is unremarkable. Other: No abdominal wall hernia or abnormality. No  abdominopelvic ascites. Musculoskeletal: No acute or significant osseous findings. IMPRESSION: 1. Interval increase in size of a metastatic liver lesion since the prior study. 2. Enlarging portacaval lymph node, as described above. 3. Status post cholecystectomy. 4. Mild aortic atherosclerosis. Aortic Atherosclerosis (ICD10-I70.0). Electronically Signed   By: Virgina Norfolk M.D.   On: 04/11/2020 19:41    Procedures Procedures   Medications Ordered in ED Medications  HYDROmorphone (DILAUDID) injection 1 mg (has no administration in time range)  ondansetron (ZOFRAN) injection 4 mg (has no administration in time range)  lactated ringers bolus 1,000 mL (has no administration in time range)    ED Course  I have reviewed the triage vital signs and the nursing notes.  Pertinent labs & imaging results that were available during my care of the patient were reviewed by me and considered in my medical decision making (see chart for details).    MDM Rules/Calculators/A&P                          Patient presenting today with worsening right-sided abdominal pain.  Patient does have known mets to his liver and is taking chronic pain medication.  Patient does have significant right upper quadrant, right CVA and right lower quadrant tenderness.  He denies any infectious symptoms and is still having bowel movements and making urine.  He does have a prior history of kidney stones and hydronephrosis as well.  Patient's labs from 3 days ago looked relatively normal except for a drop in hemoglobin.  Concern for possible hemorrhage from one of his mets with the drop in hemoglobin and worsening pain versus renal stone versus appendicitis versus exacerbated pain from  known cancer.  Lower suspicion for a respiratory cause for his pain.  Last imaging was in December which showed right lobe of the liver mets.  We will recheck labs today to ensure no further drop in hemoglobin as patient is complaining of feeling  generally weak.  Will give pain and nausea control.  5:39 PM Labs without acute changes.  Will get CT to further evaluate.  9:39 PM Patient has required multiple rounds of Dilaudid with only minimal improvement for 30 to 45 minutes.  CT shows enlargement of the metastases but no other acute process.  Patient has been off chemotherapy for approximately a month because he had had Covid.  Will admit for ongoing pain control.  He was to get an iron infusion yesterday due to a drop in his hemoglobin but there is not been a significant drop between Monday and today.   Final Clinical Impression(s) / ED Diagnoses Final diagnoses:  Intractable pain  Liver metastases [facility])    Rx / DC Orders ED Discharge Orders    None       Blanchie Dessert, MD 04/11/20 2140

## 2020-04-11 NOTE — ED Triage Notes (Addendum)
Patient reports that he has right abdominal pain that radiates into the right back x 1 week and has worsened daily. Patient states he does have mets to the liver and is suppose to restart chemo next week. Patient also c/o nausea.

## 2020-04-12 ENCOUNTER — Ambulatory Visit
Admit: 2020-04-12 | Discharge: 2020-04-12 | Disposition: A | Discharge status: Home / Self Care | Payer: 59 | Attending: Radiation Oncology | Admitting: Radiation Oncology

## 2020-04-12 ENCOUNTER — Inpatient Hospital Stay: Payer: 59

## 2020-04-12 DIAGNOSIS — G893 Neoplasm related pain (acute) (chronic): Principal | ICD-10-CM

## 2020-04-12 DIAGNOSIS — Z86718 Personal history of other venous thrombosis and embolism: Secondary | ICD-10-CM | POA: Diagnosis not present

## 2020-04-12 DIAGNOSIS — Z79899 Other long term (current) drug therapy: Secondary | ICD-10-CM | POA: Diagnosis not present

## 2020-04-12 DIAGNOSIS — Z7901 Long term (current) use of anticoagulants: Secondary | ICD-10-CM

## 2020-04-12 DIAGNOSIS — K219 Gastro-esophageal reflux disease without esophagitis: Secondary | ICD-10-CM | POA: Diagnosis present

## 2020-04-12 DIAGNOSIS — Z6841 Body Mass Index (BMI) 40.0 and over, adult: Secondary | ICD-10-CM | POA: Diagnosis not present

## 2020-04-12 DIAGNOSIS — C787 Secondary malignant neoplasm of liver and intrahepatic bile duct: Secondary | ICD-10-CM | POA: Diagnosis present

## 2020-04-12 DIAGNOSIS — Z8616 Personal history of COVID-19: Secondary | ICD-10-CM | POA: Diagnosis not present

## 2020-04-12 DIAGNOSIS — F32A Depression, unspecified: Secondary | ICD-10-CM | POA: Diagnosis present

## 2020-04-12 DIAGNOSIS — I110 Hypertensive heart disease with heart failure: Secondary | ICD-10-CM | POA: Diagnosis present

## 2020-04-12 DIAGNOSIS — R109 Unspecified abdominal pain: Secondary | ICD-10-CM

## 2020-04-12 DIAGNOSIS — I2699 Other pulmonary embolism without acute cor pulmonale: Secondary | ICD-10-CM

## 2020-04-12 DIAGNOSIS — M7989 Other specified soft tissue disorders: Secondary | ICD-10-CM

## 2020-04-12 DIAGNOSIS — I5032 Chronic diastolic (congestive) heart failure: Secondary | ICD-10-CM | POA: Diagnosis present

## 2020-04-12 DIAGNOSIS — D72828 Other elevated white blood cell count: Secondary | ICD-10-CM | POA: Diagnosis not present

## 2020-04-12 DIAGNOSIS — E039 Hypothyroidism, unspecified: Secondary | ICD-10-CM | POA: Diagnosis present

## 2020-04-12 DIAGNOSIS — C772 Secondary and unspecified malignant neoplasm of intra-abdominal lymph nodes: Secondary | ICD-10-CM | POA: Diagnosis present

## 2020-04-12 DIAGNOSIS — Z86711 Personal history of pulmonary embolism: Secondary | ICD-10-CM | POA: Diagnosis not present

## 2020-04-12 DIAGNOSIS — R52 Pain, unspecified: Secondary | ICD-10-CM | POA: Diagnosis present

## 2020-04-12 DIAGNOSIS — T380X5A Adverse effect of glucocorticoids and synthetic analogues, initial encounter: Secondary | ICD-10-CM | POA: Diagnosis not present

## 2020-04-12 DIAGNOSIS — F419 Anxiety disorder, unspecified: Secondary | ICD-10-CM | POA: Diagnosis present

## 2020-04-12 DIAGNOSIS — Z9049 Acquired absence of other specified parts of digestive tract: Secondary | ICD-10-CM | POA: Diagnosis not present

## 2020-04-12 DIAGNOSIS — C73 Malignant neoplasm of thyroid gland: Secondary | ICD-10-CM

## 2020-04-12 DIAGNOSIS — Z9221 Personal history of antineoplastic chemotherapy: Secondary | ICD-10-CM | POA: Diagnosis not present

## 2020-04-12 DIAGNOSIS — R509 Fever, unspecified: Secondary | ICD-10-CM

## 2020-04-12 DIAGNOSIS — Z79890 Hormone replacement therapy (postmenopausal): Secondary | ICD-10-CM | POA: Diagnosis not present

## 2020-04-12 LAB — COMPREHENSIVE METABOLIC PANEL
ALT: 18 U/L (ref 0–44)
AST: 24 U/L (ref 15–41)
Albumin: 3 g/dL — ABNORMAL LOW (ref 3.5–5.0)
Alkaline Phosphatase: 109 U/L (ref 38–126)
Anion gap: 10 (ref 5–15)
BUN: 13 mg/dL (ref 6–20)
CO2: 26 mmol/L (ref 22–32)
Calcium: 9.1 mg/dL (ref 8.9–10.3)
Chloride: 101 mmol/L (ref 98–111)
Creatinine, Ser: 0.89 mg/dL (ref 0.61–1.24)
GFR, Estimated: >60 mL/min (ref >60)
Glucose, Bld: 112 mg/dL — ABNORMAL HIGH (ref 70–99)
Potassium: 3.9 mmol/L (ref 3.5–5.1)
Sodium: 137 mmol/L (ref 135–145)
Total Bilirubin: 0.7 mg/dL (ref 0.3–1.2)
Total Protein: 5.8 g/dL — ABNORMAL LOW (ref 6.5–8.1)

## 2020-04-12 LAB — CBC
HCT: 28.5 % — ABNORMAL LOW (ref 39.0–52.0)
Hemoglobin: 8.8 g/dL — ABNORMAL LOW (ref 13.0–17.0)
MCH: 28.4 pg (ref 26.0–34.0)
MCHC: 30.9 g/dL (ref 30.0–36.0)
MCV: 91.9 fL (ref 80.0–100.0)
Platelets: 376 K/uL (ref 150–400)
RBC: 3.1 MIL/uL — ABNORMAL LOW (ref 4.22–5.81)
RDW: 18 % — ABNORMAL HIGH (ref 11.5–15.5)
WBC: 4.9 K/uL (ref 4.0–10.5)
nRBC: 0 % (ref 0.0–0.2)

## 2020-04-12 MED ORDER — OXYCODONE HCL ER 20 MG PO T12A
60.0000 mg | EXTENDED_RELEASE_TABLET | Freq: Three times a day (TID) | ORAL | Status: DC
Start: 2020-04-12 — End: 2020-04-13
  Administered 2020-04-12 – 2020-04-13 (×3): 60 mg via ORAL
  Filled 2020-04-12 (×3): qty 3

## 2020-04-12 MED ORDER — DEXAMETHASONE SODIUM PHOSPHATE 4 MG/ML IJ SOLN: 40.0000 mg | Freq: Once | INTRAMUSCULAR | Status: DC

## 2020-04-12 MED ORDER — SODIUM CHLORIDE 0.9 % IV SOLN
510.0000 mg | Freq: Once | INTRAVENOUS | Status: AC
  Administered 2020-04-12: 510 mg via INTRAVENOUS
  Filled 2020-04-12: qty 510

## 2020-04-12 MED ORDER — SODIUM CHLORIDE 0.9 % IV SOLN
40.0000 mg | Freq: Once | INTRAVENOUS | Status: AC
  Administered 2020-04-12: 40 mg via INTRAVENOUS
  Filled 2020-04-12: qty 4

## 2020-04-12 MED ORDER — HYDROMORPHONE HCL 2 MG/ML IJ SOLN
2.0000 mg | INTRAMUSCULAR | Status: DC | PRN
  Administered 2020-04-12 – 2020-04-13 (×9): 2 mg via INTRAVENOUS
  Administered 2020-04-13: 4 mg via INTRAVENOUS
  Administered 2020-04-13 – 2020-04-14 (×5): 2 mg via INTRAVENOUS
  Filled 2020-04-12 (×11): qty 1
  Filled 2020-04-12: qty 2
  Filled 2020-04-12 (×3): qty 1

## 2020-04-12 MED ORDER — LEVOTHYROXINE SODIUM 100 MCG PO TABS
200.0000 ug | ORAL_TABLET | Freq: Every day | ORAL | Status: DC
  Administered 2020-04-12 – 2020-04-15 (×4): 200 ug via ORAL
  Filled 2020-04-12 (×4): qty 2

## 2020-04-12 MED ORDER — SODIUM CHLORIDE 0.9% FLUSH: 10.0000 mL | INTRAVENOUS | Status: DC | PRN

## 2020-04-12 MED ORDER — METHYLPHENIDATE HCL 10 MG PO TABS
10.0000 mg | ORAL_TABLET | Freq: Every day | ORAL | Status: DC
  Administered 2020-04-12 – 2020-04-15 (×4): 10 mg via ORAL
  Filled 2020-04-12 (×4): qty 1

## 2020-04-12 MED ORDER — SODIUM CHLORIDE 0.9 % IV SOLN: INTRAVENOUS | Status: DC

## 2020-04-12 MED ORDER — OLANZAPINE 10 MG PO TABS
10.0000 mg | ORAL_TABLET | Freq: Every day | ORAL | Status: DC
Start: 2020-04-12 — End: 2020-04-15
  Administered 2020-04-12 – 2020-04-14 (×4): 10 mg via ORAL
  Filled 2020-04-12 (×4): qty 1

## 2020-04-12 MED ORDER — SODIUM CHLORIDE 0.9% FLUSH
10.0000 mL | Freq: Two times a day (BID) | INTRAVENOUS | Status: DC
  Administered 2020-04-12 – 2020-04-13 (×2): 10 mL

## 2020-04-12 MED ORDER — LEVOTHYROXINE SODIUM 88 MCG PO TABS
88.0000 ug | ORAL_TABLET | Freq: Every day | ORAL | Status: DC
  Administered 2020-04-12 – 2020-04-15 (×4): 88 ug via ORAL
  Filled 2020-04-12 (×4): qty 1

## 2020-04-12 NOTE — Consult Note (Signed)
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    ICD-10-CM   1. Intractable pain  R52   2. Liver metastases (HCC)  C78.7 0.9 %  sodium chloride infusion  3. Metastasis from thyroid cancer (HCC)  C79.9 HYDROmorphone (DILAUDID) injection 2-4 mg   C73 oxyCODONE (OXYCONTIN) 12 hr tablet 60 mg    DISCONTINUED: dexamethasone (DECADRON) injection 40 mg  4. Iron deficiency anemia due to chronic blood loss  D50.0 ferumoxytol (FERAHEME) 510 mg in sodium chloride 0.9 % 100 mL IVPB    HISTORY OF PRESENT ILLNESS: Brian [name] is a 45 y.o. male seen at the request of Dr. Marin Olp.  He has a history of aggressive, Hurthle cell carcinoma of the thyroid and has been on multiple courses of systemic therapy under the care and direction of Dr. Marin Olp.  Most recently, he was being treated with Adriamycin but this has been on hold for the last month or so while he has been recovering from Covid.  He presented to the emergency department on 04/11/2020 with worsening, severe abdominal pain.  He has had some chronic right-sided abdominal pain associated with his metastatic disease but has noted worsening pain over the past week, becoming severe despite a recent increase in his narcotic pain medications with long-acting oxycodone and using OxyIR for breakthrough pain.  A CT A/P was performed on admission and shows interval enlargement of the metastatic liver lesion (7.0 x 6.2 x 6.3 cm) and a portacaval lymph node (6.8 x 5.2 cm) as compared to his prior imaging on 02/08/2020.  The patient was admitted for pain control due to his intractable abdominal pain and we have been  consulted for consideration of palliative radiotherapy in the management of his symptomatic metastases.    PREVIOUS RADIATION THERAPY: No  PAST MEDICAL HISTORY:  Past Medical History:  Diagnosis Date  . Chronic diastolic CHF (congestive heart failure) ([place]) 04/11/2020  . DVT, lower extremity, distal, acute, left ([place]) 01/14/2020  . Essential hypertension 09/09/2018  . Goals of care, counseling/discussion 01/14/2020  . Kidney stone   . Primary cancer of thyroid with metastasis to other site ([place]) 05/04/2018  . Rickettsia infection       PAST SURGICAL HISTORY: Past Surgical History:  Procedure Laterality Date  . BIOPSY  04/05/2018   Procedure: BIOPSY;  Surgeon: Ronald Lobo, MD;  Location: [facility];  Service: Endoscopy;;  . CHOLECYSTECTOMY    . ESOPHAGOGASTRODUODENOSCOPY (EGD) WITH PROPOFOL N/A 04/05/2018   Procedure: ESOPHAGOGASTRODUODENOSCOPY (EGD) WITH PROPOFOL;  Surgeon: Ronald Lobo, MD;  Location: [facility];  Service: Endoscopy;  Laterality: N/A;  . IR IMAGING GUIDED PORT INSERTION  09/13/2019  . IR RADIOLOGIST EVAL & MGMT  10/11/2018    FAMILY HISTORY:  Family History  Problem Relation Age of Onset  . Diabetes Mother   . Heart disease Mother   . Heart failure Mother   . Heart attack Mother   . Hyperlipidemia Mother   . Hypertension Mother   . Hyperlipidemia Father   . Allergic rhinitis Father   . Rashes / Skin problems Father   . Sudden death Neg Hx   . Thyroid disease Neg Hx  SOCIAL HISTORY:  Social History   Socioeconomic History  . Marital status: Married    Spouse name: Not on file  . Number of children: Not on file  . Years of education: Not on file  . Highest education level: Not on file  Occupational History  . Not on file  Tobacco Use  . Smoking status: Never Smoker  . Smokeless tobacco: Never Used  Vaping Use  . Vaping Use: Never used  Substance and Sexual Activity  . Alcohol use: No  . Drug use: No  . Sexual activity: Not on  file  Other Topics Concern  . Not on file  Social History Narrative  . Not on file   Social Determinants of Health   Financial Resource Strain: Not on file  Food Insecurity: Not on file  Transportation Needs: Not on file  Physical Activity: Not on file  Stress: Not on file  Social Connections: Not on file  Intimate Partner Violence: Not on file    ALLERGIES: Dextromethorphan-guaifenesin, Doxycycline, Gadobutrol, Gadolinium derivatives, Guaifenesin, Ibuprofen, Lisinopril, Pantoprazole, Tramadol, Barium, and Chlorhexidine  MEDICATIONS:  Current Facility-Administered Medications  Medication Dose Route Frequency Provider Last Rate Last Admin  . 0.9 %  sodium chloride infusion   Intravenous Continuous [name], MD 75 mL/hr at 04/12/20 0958 New Bag at 04/12/20 8185  . acetaminophen (TYLENOL) tablet 650 mg  650 mg Oral Q6H PRN Opyd, Ilene Qua, MD       Or  . acetaminophen (TYLENOL) suppository 650 mg  650 mg Rectal Q6H PRN Opyd, Ilene Qua, MD      . apixaban (ELIQUIS) tablet 5 mg  5 mg Oral BID Opyd, Ilene Qua, MD   5 mg at 04/12/20 6314  . bisacodyl (DULCOLAX) EC tablet 5 mg  5 mg Oral Daily PRN Opyd, Ilene Qua, MD      . gabapentin (NEURONTIN) capsule 300 mg  300 mg Oral TID Vianne Bulls, MD   300 mg at 04/12/20 9702  . hydrALAZINE (APRESOLINE) tablet 50 mg  50 mg Oral Q6H PRN Opyd, Ilene Qua, MD      . HYDROmorphone (DILAUDID) injection 2-4 mg  2-4 mg Intravenous Q2H PRN [name], MD   2 mg at 04/12/20 1030  . levothyroxine (SYNTHROID) tablet 200 mcg  200 mcg Oral Q0600 Vianne Bulls, MD   200 mcg at 04/12/20 [phone]  . levothyroxine (SYNTHROID) tablet 88 mcg  88 mcg Oral Q0600 Vianne Bulls, MD   88 mcg at 04/12/20 0648  . methylphenidate (RITALIN) tablet 10 mg  10 mg Oral Daily Opyd, Ilene Qua, MD   10 mg at 04/12/20 5885  . OLANZapine (ZYPREXA) tablet 10 mg  10 mg Oral QHS Opyd, Ilene Qua, MD   10 mg at 04/12/20 0124  . ondansetron (ZOFRAN) tablet 4 mg  4 mg Oral  Q6H PRN Opyd, Ilene Qua, MD       Or  . ondansetron (ZOFRAN) injection 4 mg  4 mg Intravenous Q6H PRN Opyd, Ilene Qua, MD      . oxyCODONE (OXYCONTIN) 12 hr tablet 60 mg  60 mg Oral Q8H Ennever, Rudell Cobb, MD      . prochlorperazine (COMPAZINE) injection 10 mg  10 mg Intravenous Q6H PRN Opyd, Ilene Qua, MD   10 mg at 04/11/20 2310  . senna-docusate (Senokot-S) tablet 1 tablet  1 tablet Oral QHS PRN Opyd, Ilene Qua, MD       Facility-Administered Medications Ordered in Other Encounters  Medication Dose Route Frequency Provider Last Rate Last Admin  . HYDROmorphone (DILAUDID) injection 4 mg  4 mg Intravenous Once [name], MD        REVIEW OF SYSTEMS:  On review of systems, the patient reports that he is doing fairly well overall.  He reports that the abdominal pain that brought him in through the emergency department has improved significantly since the time of admission.  He is currently comfortable and able to rest. He denies any chest pain, shortness of breath, cough, fevers, chills, night sweats, or recent unintended weight changes. He denies any bowel or bladder disturbances, and denies nausea or vomiting. He denies any new musculoskeletal or joint aches or pains. A complete review of systems is obtained and is otherwise negative.    PHYSICAL EXAM:  Wt Readings from Last 3 Encounters:  04/11/20 290 lb (131.5 kg)  04/08/20 290 lb (131.5 kg)  02/29/20 296 lb 1.3 oz (134.3 kg)   Temp Readings from Last 3 Encounters:  04/12/20 99.5 F (37.5 C) (Oral)  04/08/20 98.5 F (36.9 C) (Oral)  04/08/20 98.3 F (36.8 C) (Oral)   BP Readings from Last 3 Encounters:  04/12/20 139/90  04/08/20 125/89  04/08/20 (!) 128/93   Pulse Readings from Last 3 Encounters:  04/12/20 (!) 105  04/08/20 92  04/08/20 (!) 116   Pain Assessment Pain Score: 3 /10  In general this is a well appearing Caucasian male in no acute distress. He is alert and oriented x4 and appropriate throughout the  examination. HEENT reveals that the patient is normocephalic, atraumatic. EOMs are intact. PERRLA. Skin is intact without any evidence of gross lesions. Cardiopulmonary assessment is negative for acute distress and he exhibits normal effort. The abdomen is soft, tender to palpation in the RUQ, but non distended. Lower extremities have mild, 1+ pretibial pitting edema, but no deep calf tenderness, cyanosis or clubbing.   KPS = 70  100 - Normal; no complaints; no evidence of disease. 90   - Able to carry on normal activity; minor signs or symptoms of disease. 80   - Normal activity with effort; some signs or symptoms of disease. 63   - Cares for self; unable to carry on normal activity or to do active work. 60   - Requires occasional assistance, but is able to care for most of his personal needs. 50   - Requires considerable assistance and frequent medical care. 49   - Disabled; requires special care and assistance. 74   - Severely disabled; hospital admission is indicated although death not imminent. 48   - Very sick; hospital admission necessary; active supportive treatment necessary. 10   - Moribund; fatal processes progressing rapidly. 0     - Dead  Karnofsky DA, Abelmann [place], Craver LS and Burchenal [facility] [phone]) The use of the nitrogen mustards in the palliative treatment of carcinoma: with particular reference to bronchogenic carcinoma Cancer 1 634-56  LABORATORY DATA:  Lab Results  Component Value Date   WBC 4.9 04/12/2020   HGB 8.8 (L) 04/12/2020   HCT 28.5 (L) 04/12/2020   MCV 91.9 04/12/2020   PLT 376 04/12/2020   Lab Results  Component Value Date   NA 137 04/12/2020   K 3.9 04/12/2020   CL 101 04/12/2020   CO2 26 04/12/2020   Lab Results  Component Value Date   ALT 18 04/12/2020   AST 24 04/12/2020   ALKPHOS 109 04/12/2020   BILITOT 0.7 04/12/2020  RADIOGRAPHY: CT ABDOMEN PELVIS W CONTRAST  Result Date: 04/11/2020 CLINICAL DATA:  Right-sided abdominal pain. EXAM:  CT ABDOMEN AND PELVIS WITH CONTRAST TECHNIQUE: Multidetector CT imaging of the abdomen and pelvis was performed using the standard protocol following bolus administration of intravenous contrast. CONTRAST:  135mL OMNIPAQUE IOHEXOL 300 MG/ML  SOLN COMPARISON:  February 08, 2020 FINDINGS: Lower chest: Mild areas of atelectasis are seen within the anterior aspect of the bilateral lung bases. Hepatobiliary: A 7.0 cm x 6.2 cm x 6.3 cm area of heterogeneous low attenuation is seen within the right lobe of the liver. This is increased in size when compared to the prior study. Status post cholecystectomy. No biliary dilatation. A 6.8 cm x 5.2 cm heterogeneous low-attenuation soft tissue mass is seen along the posterior aspect of the portacaval region. This is adjacent to the lateral aspect of the head of the pancreas and is increased in size when compared to the prior study. Pancreas: Unremarkable. No pancreatic ductal dilatation or surrounding inflammatory changes. Spleen: Normal in size without focal abnormality. Adrenals/Urinary Tract: Adrenal glands are unremarkable. Kidneys are normal, without renal calculi, focal lesion, or hydronephrosis. Bladder is unremarkable. Stomach/Bowel: Stomach is within normal limits. Appendix appears normal. No evidence of bowel wall thickening, distention, or inflammatory changes. Vascular/Lymphatic: Mild aortic atherosclerosis. No enlarged abdominal or pelvic lymph nodes. Reproductive: Prostate is unremarkable. Other: No abdominal wall hernia or abnormality. No abdominopelvic ascites. Musculoskeletal: No acute or significant osseous findings. IMPRESSION: 1. Interval increase in size of a metastatic liver lesion since the prior study. 2. Enlarging portacaval lymph node, as described above. 3. Status post cholecystectomy. 4. Mild aortic atherosclerosis. Aortic Atherosclerosis (ICD10-I70.0). Electronically Signed   By: Virgina Norfolk M.D.   On: 04/11/2020 19:41   DG Foot Complete  Right  Result Date: 03/13/2020 CLINICAL DATA:  Blunt trauma with bloody nail bed, initial encounter EXAM: RIGHT FOOT COMPLETE - 3+ VIEW COMPARISON:  None. FINDINGS: Mild irregularity is noted on the lateral projection at the base of the first distal phalanx which may represent an undisplaced fracture. No other fractures are seen. No significant soft tissue abnormality is noted. Calcaneal spurring is seen. IMPRESSION: Mild irregularity at the base of the first distal phalanx on the lateral projection only. This may represent an undisplaced fracture. Electronically Signed   By: Inez Catalina M.D.   On: 03/13/2020 14:53      IMPRESSION/PLAN: 1. 45 y.o. 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 Today, we talked to the patient about the findings and workup thus far. We discussed the natural history of metastatic thyroid carcinoma and general treatment, highlighting the role of palliative radiotherapy in the management. We discussed the available radiation techniques, and focused on the details and logistics of delivery.  The recommendation is to proceed with a 10 fraction course of daily palliative radiotherapy directed to the right lobe liver mass and portacaval lymph node.  We reviewed the anticipated acute and late sequelae associated with radiation in this setting. The patient was encouraged to ask questions that were answered to his stated satisfaction.  At the conclusion of our conversation, the patient is in agreement to proceed with the recommended 2-week course of daily palliative radiotherapy.  He has freely signed written consent to proceed today and a copy of this document will be placed in his medical record.  We will proceed with CT simulation/treatment planning this afternoon, in anticipation of beginning his treatments on Monday, 04/15/2020.  We discussed  that while we would favor getting in at least one radiation treatment prior to his  discharge, if he is deemed stable for discharge sooner than Monday, we can certainly proceed with the radiotherapy on an outpatient basis.  He understands that the timing of discharge will be at the discretion of his inpatient medical team.  We enjoyed meeting him today and look forward to continuing to participate in his care.  In a visit lasting 50 minutes, greater than 50% of that time was spent in floor time, discussing his case and coordinating his/her care.   Nicholos Johns, PA-C    Tyler Pita, MD  [facility] Direct Dial: [phone]  Fax: [phone] [email or web address]  Skype  LinkedIn

## 2020-04-12 NOTE — Consult Note (Signed)
Referral MD  Reason for Referral: Abdominal pain secondary to progressive thyroid cancer  Chief Complaint  Patient presents with   Cancer patient  : My abdominal pain is worse.  HPI: [name] is well-known to me.  He is a nice 45 year old white male.  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  He has this anaplastic type of thyroid cancer.  He has been on therapy.  He reports he developed Covid recently.  He has not had treatment with chemotherapy for several weeks.  He was on Adriamycin.  He called the office yesterday afternoon.  He was having worsening abdominal pain.  We had just adjusted his dose of OxyContin.  This was not helping.  He went to the emergency room.  He had a CT of the abdomen pelvis.  This supports he did show increasing his metastasis.  In the liver, there is a 7 x 6.2 x 6.3 cm mass in the right lobe of the liver.  This is increased in size.  He also has a 6.8 x 5.2 cm lymph node in the portacaval region.  He has had a little bit of a temperature.  This might be from the liver met.  He has had no diarrhea.  He is on blood thinner because of pulmonary emboli.  He has had no bleeding.  There is been no cough or shortness of breath.  He has had some leg swelling.  Again, this is been chronic.  Currently, I would say his performance status is ECOG 1.    Past Medical History:  Diagnosis Date   Chronic diastolic CHF (congestive heart failure) ([place]) 04/11/2020   DVT, lower extremity, distal, acute, left ([place]) 01/14/2020   Essential hypertension 09/09/2018   Goals of care, counseling/discussion 01/14/2020   Kidney stone    Primary cancer of thyroid with metastasis to other site ([place]) 05/04/2018   Rickettsia infection   :  Past Surgical History:  Procedure Laterality Date   BIOPSY  04/05/2018   Procedure: BIOPSY;  Surgeon: Ronald Lobo, MD;  Location: [facility];  Service: Endoscopy;;   CHOLECYSTECTOMY     ESOPHAGOGASTRODUODENOSCOPY  (EGD) WITH PROPOFOL N/A 04/05/2018   Procedure: ESOPHAGOGASTRODUODENOSCOPY (EGD) WITH PROPOFOL;  Surgeon: Ronald Lobo, MD;  Location: [facility];  Service: Endoscopy;  Laterality: N/A;   IR IMAGING GUIDED PORT INSERTION  09/13/2019   IR RADIOLOGIST EVAL & MGMT  10/11/2018  :   Current Facility-Administered Medications:    0.9 %  sodium chloride infusion, , Intravenous, Continuous, Manford Sprong, Rudell Cobb, MD   acetaminophen (TYLENOL) tablet 650 mg, 650 mg, Oral, Q6H PRN **OR** acetaminophen (TYLENOL) suppository 650 mg, 650 mg, Rectal, Q6H PRN, Opyd, Ilene Qua, MD   apixaban (ELIQUIS) tablet 5 mg, 5 mg, Oral, BID, Opyd, Ilene Qua, MD, 5 mg at 04/11/20 2320   bisacodyl (DULCOLAX) EC tablet 5 mg, 5 mg, Oral, Daily PRN, Opyd, Ilene Qua, MD   dexamethasone (DECADRON) injection 40 mg, 40 mg, Intravenous, Once, Jasleen Riepe, Rudell Cobb, MD   ferumoxytol (FERAHEME) 510 mg in sodium chloride 0.9 % 100 mL IVPB, 510 mg, Intravenous, Once, Johm Pfannenstiel, Rudell Cobb, MD   gabapentin (NEURONTIN) capsule 300 mg, 300 mg, Oral, TID, Opyd, Ilene Qua, MD, 300 mg at 04/11/20 2356   hydrALAZINE (APRESOLINE) tablet 50 mg, 50 mg, Oral, Q6H PRN, Opyd, Ilene Qua, MD   HYDROmorphone (DILAUDID) injection 1 mg, 1 mg, Intravenous, Q2H PRN, Opyd, Ilene Qua, MD, 1 mg at 04/11/20 2356  HYDROmorphone (DILAUDID) injection 4 mg, 4 mg, Intravenous, Q2H PRN, [name], MD   levothyroxine (SYNTHROID) tablet 200 mcg, 200 mcg, Oral, Q0600, Opyd, Ilene Qua, MD, 200 mcg at 04/12/20 2263   levothyroxine (SYNTHROID) tablet 88 mcg, 88 mcg, Oral, Q0600, Opyd, Ilene Qua, MD, 88 mcg at 04/12/20 3354   methylphenidate (RITALIN) tablet 10 mg, 10 mg, Oral, Daily, Opyd, Ilene Qua, MD   OLANZapine (ZYPREXA) tablet 10 mg, 10 mg, Oral, QHS, Opyd, Ilene Qua, MD, 10 mg at 04/12/20 0124   ondansetron (ZOFRAN) tablet 4 mg, 4 mg, Oral, Q6H PRN **OR** ondansetron (ZOFRAN) injection 4 mg, 4 mg, Intravenous, Q6H PRN, Opyd, Ilene Qua, MD   oxyCODONE  (OXYCONTIN) 12 hr tablet 60 mg, 60 mg, Oral, Q8H, Nazeer Romney, Rudell Cobb, MD   prochlorperazine (COMPAZINE) injection 10 mg, 10 mg, Intravenous, Q6H PRN, Opyd, Ilene Qua, MD, 10 mg at 04/11/20 2310   senna-docusate (Senokot-S) tablet 1 tablet, 1 tablet, Oral, QHS PRN, Opyd, Ilene Qua, MD  Facility-Administered Medications Ordered in Other Encounters:    HYDROmorphone (DILAUDID) injection 4 mg, 4 mg, Intravenous, Once, Kayo Zion, Rudell Cobb, MD:   apixaban  5 mg Oral BID   dexamethasone  40 mg Intravenous Once   gabapentin  300 mg Oral TID   levothyroxine  200 mcg Oral Q0600   levothyroxine  88 mcg Oral Q0600   methylphenidate  10 mg Oral Daily   OLANZapine  10 mg Oral QHS   oxyCODONE  60 mg Oral Q8H  :  Allergies  Allergen Reactions   Dextromethorphan-Guaifenesin Other (See Comments)    Irregular heartbeat    Doxycycline Anaphylaxis, Nausea And Vomiting, Rash and Other (See Comments)    "heart arrythmia" and "dyspepsia" (only oral doxycycline causes reaction)   Gadobutrol Hives and Other (See Comments)    Patient had MRI scan at [facility]. Patient called one hour after he left imaging facility to report two "blisters" that came up on "back" of lip.     Gadolinium Derivatives Hives    Patient had MRI scan at [place]. Patient called one hour after he left imaging facility to report two "blisters" that came up on "back" of lip.    Guaifenesin Palpitations        Ibuprofen Hives and Itching   Lisinopril Other (See Comments)    Angioedema   Pantoprazole Itching   Tramadol Hives and Itching   Barium Rash    Developed redness around neck after drinking 1st bottle of Barocat; pt was given Benedryl by ED Mds to be "on the safe side" before drinking 2nd bottle   Chlorhexidine Hives  :  Family History  Problem Relation Age of Onset   Diabetes Mother    Heart disease Mother    Heart failure Mother    Heart attack Mother    Hyperlipidemia Mother     Hypertension Mother    Hyperlipidemia Father    Allergic rhinitis Father    Rashes / Skin problems Father    Sudden death Neg Hx    Thyroid disease Neg Hx   :  Social History   Socioeconomic History   Marital status: Married    Spouse name: Not on file   Number of children: Not on file   Years of education: Not on file   Highest education level: Not on file  Occupational History   Not on file  Tobacco Use   Smoking status: Never Smoker   Smokeless tobacco: Never Used  Vaping Use  Vaping Use: Never used  Substance and Sexual Activity   Alcohol use: No   Drug use: No   Sexual activity: Not on file  Other Topics Concern   Not on file  Social History Narrative   Not on file   Social Determinants of Health   Financial Resource Strain: Not on file  Food Insecurity: Not on file  Transportation Needs: Not on file  Physical Activity: Not on file  Stress: Not on file  Social Connections: Not on file  Intimate Partner Violence: Not on file  :  Review of Systems  Constitutional: Positive for fever and malaise/fatigue.  HENT: Negative.   Eyes: Negative.   Respiratory: Negative.   Cardiovascular: Positive for leg swelling.  Gastrointestinal: Positive for abdominal pain.  Genitourinary: Negative.   Musculoskeletal: Negative.   Skin: Negative.   Neurological: Negative.   Endo/Heme/Allergies: Negative.   Psychiatric/Behavioral: Negative.      Exam:  Physical Exam Vitals reviewed.  HENT:     Head: Normocephalic and atraumatic.     Mouth/Throat:     Mouth: Oropharynx is clear and moist.  Eyes:     Extraocular Movements: EOM normal.     Pupils: Pupils are equal, round, and reactive to light.  Cardiovascular:     Rate and Rhythm: Normal rate and regular rhythm.     Heart sounds: Normal heart sounds.  Pulmonary:     Effort: Pulmonary effort is normal.     Breath sounds: Normal breath sounds.  Abdominal:     General: Bowel sounds are  normal.     Palpations: Abdomen is soft.  Musculoskeletal:        General: No tenderness, deformity or edema. Normal range of motion.     Cervical back: Normal range of motion.  Lymphadenopathy:     Cervical: No cervical adenopathy.  Skin:    General: Skin is warm and dry.     Findings: No erythema or rash.  Neurological:     Mental Status: He is alert and oriented to person, place, and time.  Psychiatric:        Mood and Affect: Mood and affect normal.        Behavior: Behavior normal.        Thought Content: Thought content normal.        Judgment: Judgment normal.    Patient Vitals for the past 24 hrs:  BP Temp Temp src Pulse Resp SpO2 Height Weight  04/12/20 0438 (!) 132/58 99 F (37.2 C) Oral (!) 106 20 96 % -- --  04/12/20 0038 140/83 98.8 F (37.1 C) Oral 98 18 100 % -- --  04/12/20 0010 -- -- -- -- 20 -- -- --  04/11/20 2347 (!) 140/97 -- -- (!) 106 (!) 25 100 % -- --  04/11/20 2300 (!) 140/95 -- -- (!) 104 (!) 24 100 % -- --  04/11/20 2230 (!) 140/99 -- -- (!) 103 20 99 % -- --  04/11/20 2200 (!) 151/97 -- -- (!) 106 (!) 23 100 % -- --  04/11/20 2130 (!) 131/93 -- -- (!) 102 20 100 % -- --  04/11/20 2100 125/79 -- -- (!) 101 (!) 23 97 % -- --  04/11/20 2030 130/82 -- -- 100 (!) 21 99 % -- --  04/11/20 2000 (!) 145/99 -- -- (!) 105 (!) 21 100 % -- --  04/11/20 1900 (!) 138/95 -- -- 100 (!) 23 100 % -- --  04/11/20 1830 (!) 130/100 -- -- Marland Kitchen  102 (!) 26 100 % -- --  04/11/20 1730 (!) 134/92 -- -- (!) 105 18 99 % -- --  04/11/20 1649 (!) 138/106 98.5 F (36.9 C) Oral (!) 114 (!) 26 97 % -- --  04/11/20 1627 -- -- -- -- -- -- 5' 10"  (1.778 m) 290 lb (131.5 kg)  04/11/20 1625 (!) 169/116 98.7 F (37.1 C) Oral (!) 128 20 98 % -- --     Recent Labs    04/11/20 1705 04/12/20 0329  WBC 6.7 4.9  HGB 9.3* 8.8*  HCT 30.4* 28.5*  PLT 445* 376   Recent Labs    04/11/20 1705 04/12/20 0329  NA 135 137  K 3.8 3.9  CL 99 101  CO2 25 26  GLUCOSE 97 112*  BUN 15 13   CREATININE 0.99 0.89  CALCIUM 9.1 9.1    Blood smear review: None  Pathology: None    Assessment and Plan: [name] is a very nice 45 year old white male.  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  This is truly an aggressive malignancy.  He has been on multiple courses of chemotherapy.  He currently has been on Adriamycin but he has not had this recently because of Covid.  This is clearly a quality-of-life issue for him.  We really need to try to get his pain under better control.  The best way to do this is to shrink the tumors that he has.  I will see if radiation oncology can see him.  I think this might be a good case for radiation therapy.  He does has isolated metastasis.  I think radiation might be able to provide some relief.  I will start him on some Decadron.  Maybe this can help with some of the swelling that he has.  We will make some adjustments to his medications.  I have no clue why his Port-A-Cath is not accessed.  The Port-A-Cath must be accessed.  This is why we have the Port-A-Cath.  Please DC the peripheral IV once the Port-A-Cath is accessed.  His iron is on the low side.  I will give him a dose of IV iron.  He has a strong faith.  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  I know he will get fantastic care from all staff on 3 W.  Lattie Haw, MD  2 Corinthians 12:9

## 2020-04-12 NOTE — Progress Notes (Signed)
PROGRESS NOTE    Brian [name]  HFW:[id number] DOB: 1975-09-01 DOA: 04/11/2020 PCP: Sharyne Richters, MD     Brief Narrative:  Brian [name] is a 45 y.o. male with medical history significant for thyroid cancer with liver metastases, history of DVT and PE on Eliquis, hypertension, and chronic diastolic CHF, now presenting to emergency department for evaluation of severe abdominal pain.  Patient reports chronic right-sided abdominal pain related to his cancer but has had worsening pain in recent days to weeks, now becoming severe despite recent increase in his long-acting oxycodone and continue to use short acting analgesics at home.  Pain is similar in character and location to his chronic pain but severity has increased.  He has had nausea which has been a chronic problem for him, but no vomiting or diarrhea.  He was diagnosed with COVID-19 1 month ago, had an outpatient infusion, had only mild symptoms, and has completely recovered from that.  He reports that his chemotherapy was interrupted due to his Covid diagnosis. CT of the abdomen and pelvis demonstrates interval enlargement and liver metastases and enlarging portacaval node.    Patient was admitted due to intractable abdominal pain.  New events last 24 hours / Subjective: States that he just received IV Dilaudid this morning with relief in his pain, sleepy.  Assessment & Plan:   Principal Problem:   Intractable abdominal pain Active Problems:   Primary cancer of thyroid with metastasis to other site [facility])   Essential hypertension   Pulmonary embolism without acute cor pulmonale (H[facility])   Chronic diastolic CHF (congestive heart failure) ([facility])   Intractable right-sided abdominal pain, acute on chronic cancer related pain -Appreciate Dr. Marin Olp -Awaiting radiation oncology evaluation -Continue oxycodone, Dilaudid for pain relief. Will ask palliative care to weigh in to assist in improved pain medication regimen   Thyroid cancer with  liver metastasis -Appreciate Dr. Marin Olp  History of PE -Continue Eliquis  Hypothyroidism -Continue Synthroid  Chronic diastolic heart failure -Without acute exacerbation   DVT prophylaxis:   apixaban (ELIQUIS) tablet 5 mg  Code Status: Full code Family Communication: No family at bedside Disposition Plan:  Status is: Observation  The patient will require care spanning > 2 midnights and should be moved to inpatient because: Ongoing active pain requiring inpatient pain management  Dispo: The patient is from: Home              Anticipated d/c is to: Home              Patient currently is not medically stable to d/c.   Difficult to place patient No      Consultants:   Oncology  Procedures:   None  Antimicrobials:  Anti-infectives (From admission, onward)   None        Objective: Vitals:   04/12/20 0010 04/12/20 0038 04/12/20 0438 04/12/20 1008  BP:  140/83 (!) 132/58 139/90  Pulse:  98 (!) 106 (!) 105  Resp: 20 18 20 16   Temp:  98.8 F (37.1 C) 99 F (37.2 C) 99.5 F (37.5 C)  TempSrc:  Oral Oral Oral  SpO2:  100% 96% 96%  Weight:      Height:        Intake/Output Summary (Last 24 hours) at 04/12/2020 1059 Last data filed at 04/12/2020 1034 Gross per 24 hour  Intake 402.4 ml  Output -  Net 402.4 ml   Filed Weights   04/11/20 1627  Weight: 131.5 kg    Examination:  General exam: Appears calm and comfortable  Respiratory system: Clear to auscultation. Respiratory effort normal. No respiratory distress. No conversational dyspnea.  Cardiovascular system: S1 & S2 heard, RRR. No murmurs. No pedal edema. Gastrointestinal system: Abdomen is nondistended, soft and TTP RUQ  Central nervous system: Alert and oriented. No focal neurological deficits. Speech clear.  Extremities: Symmetric in appearance  Skin: No rashes, lesions or ulcers on exposed skin  Psychiatry: Judgement and insight appear normal. Mood & affect appropriate.   Data Reviewed: I  have personally reviewed following labs and imaging studies  CBC: Recent Labs  Lab 04/08/20 1242 04/11/20 1705 04/12/20 0329  WBC 5.2 6.7 4.9  NEUTROABS 3.8  --   --   HGB 9.6* 9.3* 8.8*  HCT 30.6* 30.4* 28.5*  MCV 90.0 93.3 91.9  PLT 370 445* 659   Basic Metabolic Panel: Recent Labs  Lab 04/08/20 1242 04/11/20 1705 04/12/20 0329  NA 137 135 137  K 4.2 3.8 3.9  CL 101 99 101  CO2 28 25 26   GLUCOSE 102* 97 112*  BUN 18 15 13   CREATININE 0.96 0.99 0.89  CALCIUM 9.8 9.1 9.1   GFR: Estimated Creatinine Clearance: 144.4 mL/min (by C-G formula based on SCr of 0.89 mg/dL). Liver Function Tests: Recent Labs  Lab 04/08/20 1242 04/11/20 1705 04/12/20 0329  AST 23 26 24   ALT 22 20 18   ALKPHOS 126 118 109  BILITOT 0.5 1.0 0.7  PROT 6.5 6.7 5.8*  ALBUMIN 3.7 3.7 3.0*   Recent Labs  Lab 04/11/20 1705  LIPASE 22   Recent Labs  Lab 04/11/20 1705  AMMONIA <9*   Coagulation Profile: No results for input(s): INR, PROTIME in the last 168 hours. Cardiac Enzymes: No results for input(s): CKTOTAL, CKMB, CKMBINDEX, TROPONINI in the last 168 hours. BNP (last 3 results) No results for input(s): PROBNP in the last 8760 hours. HbA1C: No results for input(s): HGBA1C in the last 72 hours. CBG: No results for input(s): GLUCAP in the last 168 hours. Lipid Profile: No results for input(s): CHOL, HDL, LDLCALC, TRIG, CHOLHDL, LDLDIRECT in the last 72 hours. Thyroid Function Tests: No results for input(s): TSH, T4TOTAL, FREET4, T3FREE, THYROIDAB in the last 72 hours. Anemia Panel: No results for input(s): VITAMINB12, FOLATE, FERRITIN, TIBC, IRON, RETICCTPCT in the last 72 hours. Sepsis Labs: No results for input(s): PROCALCITON, LATICACIDVEN in the last 168 hours.  No results found for this or any previous visit (from the past 240 hour(s)).    Radiology Studies: CT ABDOMEN PELVIS W CONTRAST  Result Date: 04/11/2020 CLINICAL DATA:  Right-sided abdominal pain. EXAM: CT  ABDOMEN AND PELVIS WITH CONTRAST TECHNIQUE: Multidetector CT imaging of the abdomen and pelvis was performed using the standard protocol following bolus administration of intravenous contrast. CONTRAST:  121mL OMNIPAQUE IOHEXOL 300 MG/ML  SOLN COMPARISON:  February 08, 2020 FINDINGS: Lower chest: Mild areas of atelectasis are seen within the anterior aspect of the bilateral lung bases. Hepatobiliary: A 7.0 cm x 6.2 cm x 6.3 cm area of heterogeneous low attenuation is seen within the right lobe of the liver. This is increased in size when compared to the prior study. Status post cholecystectomy. No biliary dilatation. A 6.8 cm x 5.2 cm heterogeneous low-attenuation soft tissue mass is seen along the posterior aspect of the portacaval region. This is adjacent to the lateral aspect of the head of the pancreas and is increased in size when compared to the prior study. Pancreas: Unremarkable. No pancreatic ductal dilatation or surrounding inflammatory  changes. Spleen: Normal in size without focal abnormality. Adrenals/Urinary Tract: Adrenal glands are unremarkable. Kidneys are normal, without renal calculi, focal lesion, or hydronephrosis. Bladder is unremarkable. Stomach/Bowel: Stomach is within normal limits. Appendix appears normal. No evidence of bowel wall thickening, distention, or inflammatory changes. Vascular/Lymphatic: Mild aortic atherosclerosis. No enlarged abdominal or pelvic lymph nodes. Reproductive: Prostate is unremarkable. Other: No abdominal wall hernia or abnormality. No abdominopelvic ascites. Musculoskeletal: No acute or significant osseous findings. IMPRESSION: 1. Interval increase in size of a metastatic liver lesion since the prior study. 2. Enlarging portacaval lymph node, as described above. 3. Status post cholecystectomy. 4. Mild aortic atherosclerosis. Aortic Atherosclerosis (ICD10-I70.0). Electronically Signed   By: Virgina Norfolk M.D.   On: 04/11/2020 19:41      Scheduled Meds: .  apixaban  5 mg Oral BID  . gabapentin  300 mg Oral TID  . levothyroxine  200 mcg Oral Q0600  . levothyroxine  88 mcg Oral Q0600  . methylphenidate  10 mg Oral Daily  . OLANZapine  10 mg Oral QHS  . oxyCODONE  60 mg Oral Q8H   Continuous Infusions: . sodium chloride 75 mL/hr at 04/12/20 0958     LOS: 0 days      Time spent: 30 minutes   Dessa Phi, DO [facility] 04/12/2020, 10:59 AM   Available via Epic secure chat 7am-7pm After these hours, please refer to coverage provider listed on [email or web address]

## 2020-04-12 NOTE — Plan of Care (Signed)
  Problem: Education: Goal: Knowledge of General Education information will improve Description: Including pain rating scale, medication(s)/side effects and non-pharmacologic comfort measures Outcome: Progressing   Problem: Clinical Measurements: Goal: Diagnostic test results will improve Outcome: Progressing   Problem: Coping: Goal: Level of anxiety will decrease Outcome: Progressing   Problem: Pain Managment: Goal: General experience of comfort will improve Outcome: Progressing   

## 2020-04-13 ENCOUNTER — Encounter (HOSPITAL_COMMUNITY): Payer: Self-pay | Admitting: Internal Medicine

## 2020-04-13 DIAGNOSIS — R109 Unspecified abdominal pain: Secondary | ICD-10-CM | POA: Diagnosis not present

## 2020-04-13 LAB — COMPREHENSIVE METABOLIC PANEL
ALT: 16 U/L (ref 0–44)
AST: 18 U/L (ref 15–41)
Albumin: 3 g/dL — ABNORMAL LOW (ref 3.5–5.0)
Alkaline Phosphatase: 110 U/L (ref 38–126)
Anion gap: 9 (ref 5–15)
BUN: 17 mg/dL (ref 6–20)
CO2: 28 mmol/L (ref 22–32)
Calcium: 9.2 mg/dL (ref 8.9–10.3)
Chloride: 100 mmol/L (ref 98–111)
Creatinine, Ser: 0.75 mg/dL (ref 0.61–1.24)
GFR, Estimated: >60 mL/min (ref >60)
Glucose, Bld: 180 mg/dL — ABNORMAL HIGH (ref 70–99)
Potassium: 4.2 mmol/L (ref 3.5–5.1)
Sodium: 137 mmol/L (ref 135–145)
Total Bilirubin: 0.7 mg/dL (ref 0.3–1.2)
Total Protein: 6.2 g/dL — ABNORMAL LOW (ref 6.5–8.1)

## 2020-04-13 LAB — CBC
HCT: 29.9 % — ABNORMAL LOW (ref 39.0–52.0)
Hemoglobin: 9.3 g/dL — ABNORMAL LOW (ref 13.0–17.0)
MCH: 28.3 pg (ref 26.0–34.0)
MCHC: 31.1 g/dL (ref 30.0–36.0)
MCV: 90.9 fL (ref 80.0–100.0)
Platelets: 482 10*3/uL — ABNORMAL HIGH (ref 150–400)
RBC: 3.29 MIL/uL — ABNORMAL LOW (ref 4.22–5.81)
RDW: 17.2 % — ABNORMAL HIGH (ref 11.5–15.5)
WBC: 6.9 10*3/uL (ref 4.0–10.5)
nRBC: 0 % (ref 0.0–0.2)

## 2020-04-13 MED ORDER — FENTANYL 75 MCG/HR TD PT72
1.0000 | MEDICATED_PATCH | TRANSDERMAL | Status: DC
  Administered 2020-04-13: 1 via TRANSDERMAL
  Filled 2020-04-13: qty 1

## 2020-04-13 MED ORDER — SODIUM CHLORIDE 0.9 % IV SOLN
40.0000 mg | Freq: Once | INTRAVENOUS | Status: AC
  Administered 2020-04-13: 40 mg via INTRAVENOUS
  Filled 2020-04-13: qty 4

## 2020-04-13 NOTE — Progress Notes (Signed)
PROGRESS NOTE    Brian [name]  IWP:[id number] DOB: December 15, 1975 DOA: 04/11/2020 PCP: Sharyne Richters, MD     Brief Narrative:  Brian [name] is a 45 y.o. male with medical history significant for thyroid cancer with liver metastases, history of DVT and PE on Eliquis, hypertension, and chronic diastolic CHF, now presenting to emergency department for evaluation of severe abdominal pain.  Patient reports chronic right-sided abdominal pain related to his cancer but has had worsening pain in recent days to weeks, now becoming severe despite recent increase in his long-acting oxycodone and continue to use short acting analgesics at home.  Pain is similar in character and location to his chronic pain but severity has increased.  He has had nausea which has been a chronic problem for him, but no vomiting or diarrhea.  He was diagnosed with COVID-19 1 month ago, had an outpatient infusion, had only mild symptoms, and has completely recovered from that.  He reports that his chemotherapy was interrupted due to his Covid diagnosis. CT of the abdomen and pelvis demonstrates interval enlargement and liver metastases and enlarging portacaval node.    Patient was admitted due to intractable abdominal pain.  Patient has been evaluated by oncology and radiation oncology.  New events last 24 hours / Subjective: Patient was started on fentanyl patch today.  Requiring IV Dilaudid throughout the day yesterday.  Pain much better this morning.  Denies any nausea or vomiting.  Has been able to tolerate p.o.  Assessment & Plan:   Principal Problem:   Intractable abdominal pain Active Problems:   Primary cancer of thyroid with metastasis to other site [facility])   Essential hypertension   Pulmonary embolism without acute cor pulmonale (HCC)   Chronic diastolic CHF (congestive heart failure) (HCC)   Intractable pain   Intractable right-sided abdominal pain, acute on chronic cancer related pain -Planning for first radiation  treatment on Monday -Currently on fentanyl patch, IV Dilaudid, oxycodone.  Appreciate Dr. Marin Olp managing patient's pain regimen  Thyroid cancer with liver metastasis -Appreciate Dr. Marin Olp  History of PE -Continue Eliquis  Hypothyroidism -Continue Synthroid  Chronic diastolic heart failure -Without acute exacerbation   DVT prophylaxis:   apixaban (ELIQUIS) tablet 5 mg  Code Status: Full code Family Communication: No family at bedside Disposition Plan:  Status is: Inpatient  Remains inpatient appropriate because:Ongoing active pain requiring inpatient pain management   Dispo: The patient is from: Home              Anticipated d/c is to: Home              Patient currently is not medically stable to d/c.  Continue pain medication regimen.  First treatment for radiation on Monday   Difficult to place patient No    Consultants:   Oncology  Radiation oncology  Procedures:   None  Antimicrobials:  Anti-infectives (From admission, onward)   None       Objective: Vitals:   04/12/20 0438 04/12/20 1008 04/12/20 2218 04/13/20 0518  BP: (!) 132/58 139/90 (!) 149/98 (!) 149/76  Pulse: (!) 106 (!) 105 93 87  Resp: 20 16 18 19   Temp: 99 F (37.2 C) 99.5 F (37.5 C) 97.8 F (36.6 C) (!) 97.5 F (36.4 C)  TempSrc: Oral Oral Oral   SpO2: 96% 96% 92% 95%  Weight:      Height:        Intake/Output Summary (Last 24 hours) at 04/13/2020 1106 Last data filed at 04/13/2020  1157 Gross per 24 hour  Intake 1406.08 ml  Output 300 ml  Net 1106.08 ml   Filed Weights   04/11/20 1627  Weight: 131.5 kg    Examination: General exam: Appears calm and comfortable  Respiratory system: Clear to auscultation. Respiratory effort normal. Cardiovascular system: S1 & S2 heard, RRR. No pedal edema. Gastrointestinal system: Abdomen is nondistended, soft and tender to palpation right upper quadrant. Normal bowel sounds heard. Central nervous system: Alert and oriented. Non  focal exam. Speech clear  Extremities: Symmetric in appearance bilaterally  Skin: No rashes, lesions or ulcers on exposed skin  Psychiatry: Judgement and insight appear stable. Mood & affect appropriate.   Data Reviewed: I have personally reviewed following labs and imaging studies  CBC: Recent Labs  Lab 04/08/20 1242 04/11/20 1705 04/12/20 0329 04/13/20 0500  WBC 5.2 6.7 4.9 6.9  NEUTROABS 3.8  --   --   --   HGB 9.6* 9.3* 8.8* 9.3*  HCT 30.6* 30.4* 28.5* 29.9*  MCV 90.0 93.3 91.9 90.9  PLT 370 445* 376 262*   Basic Metabolic Panel: Recent Labs  Lab 04/08/20 1242 04/11/20 1705 04/12/20 0329 04/13/20 0500  NA 137 135 137 137  K 4.2 3.8 3.9 4.2  CL 101 99 101 100  CO2 28 25 26 28   GLUCOSE 102* 97 112* 180*  BUN 18 15 13 17   CREATININE 0.96 0.99 0.89 0.75  CALCIUM 9.8 9.1 9.1 9.2   GFR: Estimated Creatinine Clearance: 160.7 mL/min (by C-G formula based on SCr of 0.75 mg/dL). Liver Function Tests: Recent Labs  Lab 04/08/20 1242 04/11/20 1705 04/12/20 0329 04/13/20 0500  AST 23 26 24 18   ALT 22 20 18 16   ALKPHOS 126 118 109 110  BILITOT 0.5 1.0 0.7 0.7  PROT 6.5 6.7 5.8* 6.2*  ALBUMIN 3.7 3.7 3.0* 3.0*   Recent Labs  Lab 04/11/20 1705  LIPASE 22   Recent Labs  Lab 04/11/20 1705  AMMONIA <9*   Coagulation Profile: No results for input(s): INR, PROTIME in the last 168 hours. Cardiac Enzymes: No results for input(s): CKTOTAL, CKMB, CKMBINDEX, TROPONINI in the last 168 hours. BNP (last 3 results) No results for input(s): PROBNP in the last 8760 hours. HbA1C: No results for input(s): HGBA1C in the last 72 hours. CBG: No results for input(s): GLUCAP in the last 168 hours. Lipid Profile: No results for input(s): CHOL, HDL, LDLCALC, TRIG, CHOLHDL, LDLDIRECT in the last 72 hours. Thyroid Function Tests: No results for input(s): TSH, T4TOTAL, FREET4, T3FREE, THYROIDAB in the last 72 hours. Anemia Panel: No results for input(s): VITAMINB12, FOLATE,  FERRITIN, TIBC, IRON, RETICCTPCT in the last 72 hours. Sepsis Labs: No results for input(s): PROCALCITON, LATICACIDVEN in the last 168 hours.  No results found for this or any previous visit (from the past 240 hour(s)).    Radiology Studies: CT ABDOMEN PELVIS W CONTRAST  Result Date: 04/11/2020 CLINICAL DATA:  Right-sided abdominal pain. EXAM: CT ABDOMEN AND PELVIS WITH CONTRAST TECHNIQUE: Multidetector CT imaging of the abdomen and pelvis was performed using the standard protocol following bolus administration of intravenous contrast. CONTRAST:  139mL OMNIPAQUE IOHEXOL 300 MG/ML  SOLN COMPARISON:  February 08, 2020 FINDINGS: Lower chest: Mild areas of atelectasis are seen within the anterior aspect of the bilateral lung bases. Hepatobiliary: A 7.0 cm x 6.2 cm x 6.3 cm area of heterogeneous low attenuation is seen within the right lobe of the liver. This is increased in size when compared to the prior study. Status  post cholecystectomy. No biliary dilatation. A 6.8 cm x 5.2 cm heterogeneous low-attenuation soft tissue mass is seen along the posterior aspect of the portacaval region. This is adjacent to the lateral aspect of the head of the pancreas and is increased in size when compared to the prior study. Pancreas: Unremarkable. No pancreatic ductal dilatation or surrounding inflammatory changes. Spleen: Normal in size without focal abnormality. Adrenals/Urinary Tract: Adrenal glands are unremarkable. Kidneys are normal, without renal calculi, focal lesion, or hydronephrosis. Bladder is unremarkable. Stomach/Bowel: Stomach is within normal limits. Appendix appears normal. No evidence of bowel wall thickening, distention, or inflammatory changes. Vascular/Lymphatic: Mild aortic atherosclerosis. No enlarged abdominal or pelvic lymph nodes. Reproductive: Prostate is unremarkable. Other: No abdominal wall hernia or abnormality. No abdominopelvic ascites. Musculoskeletal: No acute or significant osseous  findings. IMPRESSION: 1. Interval increase in size of a metastatic liver lesion since the prior study. 2. Enlarging portacaval lymph node, as described above. 3. Status post cholecystectomy. 4. Mild aortic atherosclerosis. Aortic Atherosclerosis (ICD10-I70.0). Electronically Signed   By: Virgina Norfolk M.D.   On: 04/11/2020 19:41      Scheduled Meds: . apixaban  5 mg Oral BID  . fentaNYL  1 patch Transdermal Q72H  . gabapentin  300 mg Oral TID  . levothyroxine  200 mcg Oral Q0600  . levothyroxine  88 mcg Oral Q0600  . methylphenidate  10 mg Oral Daily  . OLANZapine  10 mg Oral QHS  . sodium chloride flush  10-40 mL Intracatheter Q12H   Continuous Infusions: . sodium chloride 75 mL/hr at 04/13/20 0600     LOS: 1 day      Time spent: 25 minutes   Dessa Phi, DO [facility] 04/13/2020, 11:06 AM   Available via Epic secure chat 7am-7pm After these hours, please refer to coverage provider listed on [email or web address]

## 2020-04-13 NOTE — Progress Notes (Signed)
[name] looks much more comfortable this morning.  Hopefully, the Decadron that we gave him that helped.  I am sure there is a lot of inflammation with these tumors.  He is already seen by radiation oncology.  As always, they did a fantastic job.  He will start some radiation on Monday.  His pain is better.  I am going to try him on a Duragesic patch.  We will stop the OxyContin.  I will try him on a 75 mcg patch of Duragesic.  Hopefully this might help with some chronic pain issues.  He has a better appetite.  I am sure the Decadron is helping this.  There is been no problems with nausea or vomiting.  He is going to the bathroom okay.  He did receive some IV iron yesterday.  His labs today show white cell count 6.9.  Hemoglobin 9.3.  Platelet count 482,000.  His BUN is 17 creatinine 0.75.  His albumin is 3.0.  On his exam, his vital signs are temperature 97.5.  Pulse 87.  Blood pressure 149/76.  His lungs are clear.  Cardiac exam regular rate and rhythm.  Abdomen is soft.  Bowel sounds are slightly decreased.  There is no fluid wave.  There is no palpable liver or spleen tip.  Skin exam shows no rashes, ecchymoses or petechia.  Oral exam shows no thrush.  Hopefully, we will continue to improve [name] pain.  We will see how the Duragesic patch works for [place].  I still that he probably will need to be here through the weekend just to help with the pain issues.  I want to make sure that he does not have any flareups.  I know he is getting outstanding care from all the staff on [address]  Lattie Haw, MD  Proverbs 21:30

## 2020-04-13 NOTE — Plan of Care (Signed)
  Problem: Clinical Measurements: Goal: Diagnostic test results will improve Outcome: Progressing   Problem: Clinical Measurements: Goal: Respiratory complications will improve Outcome: Progressing   Problem: Clinical Measurements: Goal: Cardiovascular complication will be avoided Outcome: Progressing   Problem: Pain Managment: Goal: General experience of comfort will improve Outcome: Progressing   

## 2020-04-14 DIAGNOSIS — R109 Unspecified abdominal pain: Secondary | ICD-10-CM | POA: Diagnosis not present

## 2020-04-14 LAB — COMPREHENSIVE METABOLIC PANEL
ALT: 16 U/L (ref 0–44)
AST: 16 U/L (ref 15–41)
Albumin: 3.1 g/dL — ABNORMAL LOW (ref 3.5–5.0)
Alkaline Phosphatase: 97 U/L (ref 38–126)
Anion gap: 10 (ref 5–15)
BUN: 23 mg/dL — ABNORMAL HIGH (ref 6–20)
CO2: 28 mmol/L (ref 22–32)
Calcium: 9.1 mg/dL (ref 8.9–10.3)
Chloride: 100 mmol/L (ref 98–111)
Creatinine, Ser: 0.83 mg/dL (ref 0.61–1.24)
GFR, Estimated: >60 mL/min (ref >60)
Glucose, Bld: 167 mg/dL — ABNORMAL HIGH (ref 70–99)
Potassium: 4 mmol/L (ref 3.5–5.1)
Sodium: 138 mmol/L (ref 135–145)
Total Bilirubin: 0.4 mg/dL (ref 0.3–1.2)
Total Protein: 6.1 g/dL — ABNORMAL LOW (ref 6.5–8.1)

## 2020-04-14 LAB — CBC WITH DIFFERENTIAL/PLATELET
Abs Immature Granulocytes: 0.26 10*3/uL — ABNORMAL HIGH (ref 0.00–0.07)
Basophils Absolute: 0 10*3/uL (ref 0.0–0.1)
Basophils Relative: 0 %
Eosinophils Absolute: 0 10*3/uL (ref 0.0–0.5)
Eosinophils Relative: 0 %
HCT: 29.2 % — ABNORMAL LOW (ref 39.0–52.0)
Hemoglobin: 9.1 g/dL — ABNORMAL LOW (ref 13.0–17.0)
Immature Granulocytes: 2 %
Lymphocytes Relative: 3 %
Lymphs Abs: 0.5 10*3/uL — ABNORMAL LOW (ref 0.7–4.0)
MCH: 29 pg (ref 26.0–34.0)
MCHC: 31.2 g/dL (ref 30.0–36.0)
MCV: 93 fL (ref 80.0–100.0)
Monocytes Absolute: 0.8 10*3/uL (ref 0.1–1.0)
Monocytes Relative: 5 %
Neutro Abs: 13.9 10*3/uL — ABNORMAL HIGH (ref 1.7–7.7)
Neutrophils Relative %: 90 %
Platelets: 514 10*3/uL — ABNORMAL HIGH (ref 150–400)
RBC: 3.14 MIL/uL — ABNORMAL LOW (ref 4.22–5.81)
RDW: 17.4 % — ABNORMAL HIGH (ref 11.5–15.5)
WBC: 15.4 10*3/uL — ABNORMAL HIGH (ref 4.0–10.5)
nRBC: 0.1 % (ref 0.0–0.2)

## 2020-04-14 LAB — LACTATE DEHYDROGENASE: LDH: 185 U/L (ref 98–192)

## 2020-04-14 MED ORDER — POLYETHYLENE GLYCOL 3350 17 G PO PACK
17.0000 g | PACK | Freq: Two times a day (BID) | ORAL | Status: DC
  Administered 2020-04-14 (×2): 17 g via ORAL
  Filled 2020-04-14 (×3): qty 1

## 2020-04-14 MED ORDER — HYDROMORPHONE HCL 2 MG PO TABS
4.0000 mg | ORAL_TABLET | ORAL | Status: DC | PRN
  Administered 2020-04-14 – 2020-04-15 (×6): 4 mg via ORAL
  Filled 2020-04-14 (×6): qty 2

## 2020-04-14 MED ORDER — SODIUM CHLORIDE 0.9 % IV SOLN
20.0000 mg | Freq: Once | INTRAVENOUS | Status: AC
  Administered 2020-04-14: 20 mg via INTRAVENOUS
  Filled 2020-04-14: qty 2

## 2020-04-14 NOTE — Progress Notes (Signed)
Overall, he is about the same.  We started the Duragesic patch yesterday.  Hopefully it will start to have some effect today.  He still not had a bowel movement.  He probably needs to have something extra for this.  I will start some oral Dilaudid for him.  We will see if this might help a little bit.  He is on some IV Decadron.  I will give him a 20 mg dose today.  His labs look okay.  His blood sugar is up a little bit again, because of the Decadron.  His hemoglobin is 9.1.  His white cell count is 15.4.  Platelet count 514,000.  His appetite is doing pretty well.  There is no nausea or vomiting.  He still is not walking all that much.  He is getting up and going to the bathroom.  He is on anticoagulation for the pulmonary embolism.  There is no bleeding from this.  There is no cough.  No bleeding.  There is no shortness of breath.  His vital signs all look stable.  His temperature is 97.5.  Pulse 91.  Blood pressure 157/89.  His lungs are clear.  Oral exam shows no thrush.  Cardiac exam regular rate and rhythm.  Abdomen is soft.  He is obese.  There is no guarding.  His bowel sounds are little bit decreased.  Extremities shows no clubbing, cyanosis or edema.  Neurological exam is nonfocal.  He will start radiation therapy tomorrow.  Hopefully, he will be able to go home tomorrow after radiation.  Again a lot will depend on his pain control.  I appreciate the outstanding care that he is getting from all the staff on [place], [place]  Psalm 34:2

## 2020-04-14 NOTE — Plan of Care (Signed)

## 2020-04-14 NOTE — Progress Notes (Signed)
PROGRESS NOTE    [name]  GYJ:[id number] DOB: Jan 01, 1976 DOA: 04/11/2020 PCP: Sharyne Richters, MD     Brief Narrative:  Brian [name] is a 45 y.o. male with medical history significant for thyroid cancer with liver metastases, history of DVT and PE on Eliquis, hypertension, and chronic diastolic CHF, now presenting to emergency department for evaluation of severe abdominal pain.  Patient reports chronic right-sided abdominal pain related to his cancer but has had worsening pain in recent days to weeks, now becoming severe despite recent increase in his long-acting oxycodone and continue to use short acting analgesics at home.  Pain is similar in character and location to his chronic pain but severity has increased.  He has had nausea which has been a chronic problem for him, but no vomiting or diarrhea.  He was diagnosed with COVID-19 1 month ago, had an outpatient infusion, had only mild symptoms, and has completely recovered from that.  He reports that his chemotherapy was interrupted due to his Covid diagnosis. CT of the abdomen and pelvis demonstrates interval enlargement and liver metastases and enlarging portacaval node.    Patient was admitted due to intractable abdominal pain.  Patient has been evaluated by oncology and radiation oncology.  New events last 24 hours / Subjective: MAR showing patient still requiring IV dilaudid every 2-4 hours last 24 hours. This morning, Dr. Marin Olp added on PO dilaudid to assist with pain. Patient feeling ok overall, good appetite, no BM yet.   Assessment & Plan:   Principal Problem:   Intractable abdominal pain Active Problems:   Primary cancer of thyroid with metastasis to other site [facility])   Essential hypertension   Pulmonary embolism without acute cor pulmonale (HCC)   Chronic diastolic CHF (congestive heart failure) (HCC)   Intractable pain   Intractable right-sided abdominal pain, acute on chronic cancer related pain -Planning for first  radiation treatment on Monday -Currently on fentanyl patch, PO and IV Dilaudid.  Appreciate Dr. Marin Olp managing patient's pain regimen  Leukocytosis -Likely decadron use. Monitor   Thyroid cancer with liver metastasis -Appreciate Dr. Marin Olp  History of PE -Continue Eliquis  Hypothyroidism -Continue Synthroid  Chronic diastolic heart failure -Without acute exacerbation   DVT prophylaxis:   apixaban (ELIQUIS) tablet 5 mg  Code Status: Full code Family Communication: No family at bedside Disposition Plan:  Status is: Inpatient  Remains inpatient appropriate because:Ongoing active pain requiring inpatient pain management   Dispo: The patient is from: Home              Anticipated d/c is to: Home              Patient currently is not medically stable to d/c.  Continue pain medication regimen.  First treatment for radiation on Monday   Difficult to place patient No    Consultants:   Oncology  Radiation oncology  Procedures:   None  Antimicrobials:  Anti-infectives (From admission, onward)   None       Objective: Vitals:   04/13/20 0518 04/13/20 1345 04/13/20 2110 04/14/20 0527  BP: (!) 149/76 (!) 145/93 (!) 150/95 (!) 157/89  Pulse: 87 98 96 91  Resp: 19 16 18 17   Temp: (!) 97.5 F (36.4 C) 97.9 F (36.6 C) 98.8 F (37.1 C) (!) 97.5 F (36.4 C)  TempSrc:  Oral Oral Axillary  SpO2: 95% 95% 96% 98%  Weight:      Height:        Intake/Output Summary (Last 24  hours) at 04/14/2020 0846 Last data filed at 04/14/2020 0726 Gross per 24 hour  Intake 1320 ml  Output 400 ml  Net 920 ml   Filed Weights   04/11/20 1627  Weight: 131.5 kg    Examination: General exam: Appears calm and comfortable  Respiratory system: Clear to auscultation. Respiratory effort normal. Cardiovascular system: S1 & S2 heard, RRR. No pedal edema. Gastrointestinal system: Abdomen is nondistended, soft. Normal bowel sounds heard. Central nervous system: Alert and oriented.  Non focal exam. Speech clear  Extremities: Symmetric in appearance bilaterally  Skin: No rashes, lesions or ulcers on exposed skin  Psychiatry: Judgement and insight appear stable. Mood & affect appropriate.    Data Reviewed: I have personally reviewed following labs and imaging studies  CBC: Recent Labs  Lab 04/08/20 1242 04/11/20 1705 04/12/20 0329 04/13/20 0500 04/14/20 0430  WBC 5.2 6.7 4.9 6.9 15.4*  NEUTROABS 3.8  --   --   --  13.9*  HGB 9.6* 9.3* 8.8* 9.3* 9.1*  HCT 30.6* 30.4* 28.5* 29.9* 29.2*  MCV 90.0 93.3 91.9 90.9 93.0  PLT 370 445* 376 482* 419*   Basic Metabolic Panel: Recent Labs  Lab 04/08/20 1242 04/11/20 1705 04/12/20 0329 04/13/20 0500 04/14/20 0430  NA 137 135 137 137 138  K 4.2 3.8 3.9 4.2 4.0  CL 101 99 101 100 100  CO2 28 25 26 28 28   GLUCOSE 102* 97 112* 180* 167*  BUN 18 15 13 17  23*  CREATININE 0.96 0.99 0.89 0.75 0.83  CALCIUM 9.8 9.1 9.1 9.2 9.1   GFR: Estimated Creatinine Clearance: 154.9 mL/min (by C-G formula based on SCr of 0.83 mg/dL). Liver Function Tests: Recent Labs  Lab 04/08/20 1242 04/11/20 1705 04/12/20 0329 04/13/20 0500 04/14/20 0430  AST 23 26 24 18 16   ALT 22 20 18 16 16   ALKPHOS 126 118 109 110 97  BILITOT 0.5 1.0 0.7 0.7 0.4  PROT 6.5 6.7 5.8* 6.2* 6.1*  ALBUMIN 3.7 3.7 3.0* 3.0* 3.1*   Recent Labs  Lab 04/11/20 1705  LIPASE 22   Recent Labs  Lab 04/11/20 1705  AMMONIA <9*   Coagulation Profile: No results for input(s): INR, PROTIME in the last 168 hours. Cardiac Enzymes: No results for input(s): CKTOTAL, CKMB, CKMBINDEX, TROPONINI in the last 168 hours. BNP (last 3 results) No results for input(s): PROBNP in the last 8760 hours. HbA1C: No results for input(s): HGBA1C in the last 72 hours. CBG: No results for input(s): GLUCAP in the last 168 hours. Lipid Profile: No results for input(s): CHOL, HDL, LDLCALC, TRIG, CHOLHDL, LDLDIRECT in the last 72 hours. Thyroid Function Tests: No results  for input(s): TSH, T4TOTAL, FREET4, T3FREE, THYROIDAB in the last 72 hours. Anemia Panel: No results for input(s): VITAMINB12, FOLATE, FERRITIN, TIBC, IRON, RETICCTPCT in the last 72 hours. Sepsis Labs: No results for input(s): PROCALCITON, LATICACIDVEN in the last 168 hours.  No results found for this or any previous visit (from the past 240 hour(s)).    Radiology Studies: No results found.    Scheduled Meds: . apixaban  5 mg Oral BID  . fentaNYL  1 patch Transdermal Q72H  . gabapentin  300 mg Oral TID  . levothyroxine  200 mcg Oral Q0600  . levothyroxine  88 mcg Oral Q0600  . methylphenidate  10 mg Oral Daily  . OLANZapine  10 mg Oral QHS  . polyethylene glycol  17 g Oral BID  . sodium chloride flush  10-40 mL Intracatheter Q12H  Continuous Infusions: . dexamethasone (DECADRON) IVPB ([facility])       LOS: 2 days      Time spent: 20 minutes   Dessa Phi, DO [facility] 04/14/2020, 8:46 AM   Available via Epic secure chat 7am-7pm After these hours, please refer to coverage provider listed on [email or web address]

## 2020-04-14 NOTE — Progress Notes (Signed)
Pt ambulated to room 3007 without complication. No needs at time of transfer. Pt remains stable.

## 2020-04-14 NOTE — Plan of Care (Signed)
  Problem: Clinical Measurements: Goal: Respiratory complications will improve Outcome: Progressing   Problem: Clinical Measurements: Goal: Cardiovascular complication will be avoided Outcome: Progressing   Problem: Elimination: Goal: Will not experience complications related to bowel motility Outcome: Progressing

## 2020-04-15 ENCOUNTER — Inpatient Hospital Stay: Payer: 59

## 2020-04-15 ENCOUNTER — Ambulatory Visit
Admit: 2020-04-15 | Discharge: 2020-04-15 | Disposition: A | Discharge status: Home / Self Care | Payer: 59 | Attending: Radiation Oncology | Admitting: Radiation Oncology

## 2020-04-15 ENCOUNTER — Other Ambulatory Visit (HOSPITAL_BASED_OUTPATIENT_CLINIC_OR_DEPARTMENT_OTHER): Payer: Self-pay | Admitting: Internal Medicine

## 2020-04-15 DIAGNOSIS — R109 Unspecified abdominal pain: Secondary | ICD-10-CM | POA: Diagnosis not present

## 2020-04-15 LAB — COMPREHENSIVE METABOLIC PANEL
ALT: 18 U/L (ref 0–44)
AST: 18 U/L (ref 15–41)
Albumin: 3.2 g/dL — ABNORMAL LOW (ref 3.5–5.0)
Alkaline Phosphatase: 87 U/L (ref 38–126)
Anion gap: 10 (ref 5–15)
BUN: 25 mg/dL — ABNORMAL HIGH (ref 6–20)
CO2: 26 mmol/L (ref 22–32)
Calcium: 9.1 mg/dL (ref 8.9–10.3)
Chloride: 103 mmol/L (ref 98–111)
Creatinine, Ser: 0.78 mg/dL (ref 0.61–1.24)
GFR, Estimated: >60 mL/min (ref >60)
Glucose, Bld: 116 mg/dL — ABNORMAL HIGH (ref 70–99)
Potassium: 4 mmol/L (ref 3.5–5.1)
Sodium: 139 mmol/L (ref 135–145)
Total Bilirubin: 0.5 mg/dL (ref 0.3–1.2)
Total Protein: 6 g/dL — ABNORMAL LOW (ref 6.5–8.1)

## 2020-04-15 LAB — CBC WITH DIFFERENTIAL/PLATELET
Abs Immature Granulocytes: 0.63 10*3/uL — ABNORMAL HIGH (ref 0.00–0.07)
Basophils Absolute: 0 10*3/uL (ref 0.0–0.1)
Basophils Relative: 0 %
Eosinophils Absolute: 0 10*3/uL (ref 0.0–0.5)
Eosinophils Relative: 0 %
HCT: 29.4 % — ABNORMAL LOW (ref 39.0–52.0)
Hemoglobin: 9.2 g/dL — ABNORMAL LOW (ref 13.0–17.0)
Immature Granulocytes: 5 %
Lymphocytes Relative: 6 %
Lymphs Abs: 0.8 10*3/uL (ref 0.7–4.0)
MCH: 28.9 pg (ref 26.0–34.0)
MCHC: 31.3 g/dL (ref 30.0–36.0)
MCV: 92.5 fL (ref 80.0–100.0)
Monocytes Absolute: 1 10*3/uL (ref 0.1–1.0)
Monocytes Relative: 8 %
Neutro Abs: 11.5 10*3/uL — ABNORMAL HIGH (ref 1.7–7.7)
Neutrophils Relative %: 81 %
Platelets: 449 10*3/uL — ABNORMAL HIGH (ref 150–400)
RBC: 3.18 MIL/uL — ABNORMAL LOW (ref 4.22–5.81)
RDW: 17.2 % — ABNORMAL HIGH (ref 11.5–15.5)
WBC: 14 10*3/uL — ABNORMAL HIGH (ref 4.0–10.5)
nRBC: 0.6 % — ABNORMAL HIGH (ref 0.0–0.2)

## 2020-04-15 LAB — LACTATE DEHYDROGENASE: LDH: 200 U/L — ABNORMAL HIGH (ref 98–192)

## 2020-04-15 MED ORDER — HYDROMORPHONE HCL 4 MG PO TABS: 4.0000 mg | ORAL_TABLET | ORAL | 0 refills | Status: DC | PRN

## 2020-04-15 MED ORDER — POLYETHYLENE GLYCOL 3350 17 G PO PACK: 17.0000 g | PACK | Freq: Every day | ORAL | 0 refills | Status: DC | PRN

## 2020-04-15 MED ORDER — FENTANYL 75 MCG/HR TD PT72: 1.0000 | MEDICATED_PATCH | TRANSDERMAL | 0 refills | Status: DC

## 2020-04-15 MED ORDER — HEPARIN SOD (PORK) LOCK FLUSH 100 UNIT/ML IV SOLN
500.0000 [IU] | INTRAVENOUS | Status: AC | PRN
  Administered 2020-04-15: 500 [IU]
  Filled 2020-04-15: qty 5

## 2020-04-15 MED ORDER — SENNOSIDES-DOCUSATE SODIUM 8.6-50 MG PO TABS: 1.0000 | ORAL_TABLET | Freq: Every evening | ORAL | 0 refills | Status: DC | PRN

## 2020-04-15 MED ORDER — METOCLOPRAMIDE HCL 10 MG PO TABS: 10.0000 mg | ORAL_TABLET | Freq: Four times a day (QID) | ORAL | 0 refills | Status: DC | PRN

## 2020-04-15 NOTE — Discharge Summary (Signed)
Physician Discharge Summary  JAMICAH ANSTEAD ZMO:[id number] DOB: [birth year] DOA: 04/11/2020  PCP: Sharyne Richters, MD  Admit date: 04/11/2020 Discharge date: 04/15/2020  Admitted From: home Disposition:  home  Recommendations for Outpatient Follow-up:  Follow up with Dr. Marin Olp and radiation oncology treatments as scheduled  Discharge Condition: Stable CODE STATUS: Full code Diet recommendation: Regular   Brief/Interim Summary: Brian [name] is a 45 y.o.malewith medical history significant forthyroid cancer with liver metastases, history of DVT and PE on Eliquis, hypertension, and chronic diastolic CHF, now presenting to emergency department for evaluation of severe abdominal pain. Patient reports chronic right-sided abdominal pain related to his cancer but has had worsening pain in recent days to weeks, now becoming severe despite recent increase in his long-acting oxycodone and continue to use short acting analgesics at home. Pain is similar in character and location to his chronic pain but severity has increased. He has had nausea which has been a chronic problem for him, but no vomiting or diarrhea. He was diagnosed with COVID-19 1 month ago, had an outpatient infusion, had only mild symptoms, and has completely recovered from that. He reports that his chemotherapy was interrupted due to his Covid diagnosis. CT of the abdomen and pelvis demonstrates interval enlargement and liver metastases and enlarging portacaval node.  Patient was admitted due to intractable abdominal pain.  Patient has been evaluated by oncology and radiation oncology. Patient's pain medication regimen was adjusted.  He underwent his first treatment with radiation oncology starting 2/28.  Discharge Diagnoses:  Principal Problem:   Intractable abdominal pain Active Problems:   Primary cancer of thyroid with metastasis to other site [facility])   Essential hypertension   Pulmonary embolism without acute cor pulmonale  (HCC)   Chronic diastolic CHF (congestive heart failure) (HCC)   Intractable pain   Intractable right-sided abdominal pain, acute on chronic cancer related pain -Starting first radiation treatment on 2/28 daily for 2 weeks -Currently on fentanyl patch, PO Dilaudid.  Appreciate Dr. Marin Olp managing patient's pain regimen  Leukocytosis -Likely decadron use  Thyroid cancer with liver metastasis -Appreciate Dr. Marin Olp  History of PE -Continue Eliquis  Hypothyroidism -Continue Synthroid  Chronic diastolic heart failure -Without acute exacerbation   Discharge Instructions  Discharge Instructions    Call MD for:  difficulty breathing, headache or visual disturbances   Complete by: As directed    Call MD for:  extreme fatigue   Complete by: As directed    Call MD for:  persistant dizziness or light-headedness   Complete by: As directed    Call MD for:  persistant nausea and vomiting   Complete by: As directed    Call MD for:  severe uncontrolled pain   Complete by: As directed    Call MD for:  temperature >100.4   Complete by: As directed    Discharge instructions   Complete by: As directed    .[email or web address]   Increase activity slowly   Complete by: As directed      Allergies as of 04/15/2020      Reactions   Dextromethorphan-guaifenesin Other (See Comments)   Irregular heartbeat   Doxycycline Anaphylaxis, Nausea And Vomiting, Rash, Other (See Comments)   "heart arrythmia" and "dyspepsia" (only oral doxycycline causes reaction)   Gadobutrol Hives, Other (See Comments)   Patient had MRI scan at [place]. Patient called one hour after he left imaging facility to report two "blisters" that came up on "back" of lip.    Gadolinium Derivatives Hives  Patient had MRI scan at [place]. Patient called one hour after he left imaging facility to report two "blisters" that came up on "back" of lip.    Guaifenesin Palpitations      Ibuprofen Hives, Itching    Lisinopril Other (See Comments)   Angioedema   Pantoprazole Itching   Tramadol Hives, Itching   Barium Rash   Developed redness around neck after drinking 1st bottle of Barocat; pt was given Benedryl by ED Mds to be "on the safe side" before drinking 2nd bottle   Chlorhexidine Hives      Medication List    STOP taking these medications   fluconazole 100 MG tablet Commonly known as: DIFLUCAN   lidocaine-prilocaine cream Commonly known as: EMLA   losartan 25 MG tablet Commonly known as: COZAAR   magic mouthwash Soln   nystatin 100000 UNIT/ML suspension Commonly known as: MYCOSTATIN   oxyCODONE 30 MG 12 hr tablet   Oxycodone HCl 10 MG Tabs   prochlorperazine 10 MG tablet Commonly known as: COMPAZINE   sucralfate 1 GM/10ML suspension Commonly known as: Carafate     TAKE these medications   apixaban 5 MG Tabs tablet Commonly known as: ELIQUIS Take 1 tablet (5 mg total) by mouth 2 (two) times daily.   EPINEPHrine 0.3 mg/0.3 mL Soaj injection Commonly known as: EpiPen 2-Pak USE AS DIRECTED FOR LIFE THREATENING ALLERGIC REACTIONS What changed:   how much to take  how to take this  when to take this  reasons to take this  additional instructions   fentaNYL 75 MCG/HR Commonly known as: [place] 1 patch onto the skin every 3 (three) days. Start taking on: April 16, 2020   furosemide 80 MG tablet Commonly known as: LASIX TAKE 1 TABLET (80 MG TOTAL) BY MOUTH DAILY. What changed:   when to take this  reasons to take this   gabapentin 300 MG capsule Commonly known as: NEURONTIN TAKE 1 CAPSULE (300 MG TOTAL) BY MOUTH 3 (THREE) TIMES DAILY.   HYDROmorphone 4 MG tablet Commonly known as: DILAUDID Take 1 tablet (4 mg total) by mouth every 4 (four) hours as needed for severe pain.   levothyroxine 88 MCG tablet Commonly known as: SYNTHROID Take 88 mcg by mouth daily before breakfast. What changed: Another medication with the same name was removed.  Continue taking this medication, and follow the directions you see here.   levothyroxine 200 MCG tablet Commonly known as: SYNTHROID Take 1 tablet (200 mcg total) by mouth daily. Take along with 88 mcg=288 mcg What changed: Another medication with the same name was removed. Continue taking this medication, and follow the directions you see here.   LORazepam 0.5 MG tablet Commonly known as: ATIVAN TAKE 1 TABLET BY MOUTH EVERY 6 HOURS AS NEEDED FOR NAUSEA & VOMITING   methylphenidate 10 MG tablet Commonly known as: RITALIN TAKE 1 TABLET (10 MG TOTAL) BY MOUTH 2 (TWO) TIMES DAILY. What changed: when to take this   metoCLOPramide 10 MG tablet Commonly known as: REGLAN Take 1 tablet (10 mg total) by mouth every 6 (six) hours as needed for nausea or vomiting. What changed: See the new instructions.   metolazone 5 MG tablet Commonly known as: ZAROXOLYN TAKE 1 TABLET (5 MG TOTAL) BY MOUTH DAILY. TAKE 1 HOUR BEFORE LASIX. What changed:   when to take this  reasons to take this  additional instructions   multivitamin capsule Take 1 capsule by mouth daily.   OLANZapine 10 MG tablet Commonly  known as: ZYPREXA Take 10 mg by mouth at bedtime.   polyethylene glycol 17 g packet Commonly known as: MIRALAX / GLYCOLAX Take 17 g by mouth daily as needed for mild constipation.   potassium chloride 10 MEQ tablet Commonly known as: KLOR-CON TAKE 1 TABLET BY MOUTH ONCE DAILY   senna-docusate 8.6-50 MG tablet Commonly known as: Senokot-S Take 1 tablet by mouth at bedtime as needed for mild constipation.       Follow-up Information    [name], MD Follow up.   Specialty: Oncology Contact information: [place] [address] [phone]              Allergies  Allergen Reactions  . Dextromethorphan-Guaifenesin Other (See Comments)    Irregular heartbeat   . Doxycycline Anaphylaxis, Nausea And Vomiting, Rash and Other (See Comments)     "heart arrythmia" and "dyspepsia" (only oral doxycycline causes reaction)  . Gadobutrol Hives and Other (See Comments)    Patient had MRI scan at [place]. Patient called one hour after he left imaging facility to report two "blisters" that came up on "back" of lip.    . Gadolinium Derivatives Hives    Patient had MRI scan at [place]. Patient called one hour after he left imaging facility to report two "blisters" that came up on "back" of lip.   . Guaifenesin Palpitations       . Ibuprofen Hives and Itching  . Lisinopril Other (See Comments)    Angioedema  . Pantoprazole Itching  . Tramadol Hives and Itching  . Barium Rash    Developed redness around neck after drinking 1st bottle of Barocat; pt was given Benedryl by ED Mds to be "on the safe side" before drinking 2nd bottle  . Chlorhexidine Hives    Consultations:  Oncology  Radiation oncology   Procedures/Studies: CT ABDOMEN PELVIS W CONTRAST  Result Date: 04/11/2020 CLINICAL DATA:  Right-sided abdominal pain. EXAM: CT ABDOMEN AND PELVIS WITH CONTRAST TECHNIQUE: Multidetector CT imaging of the abdomen and pelvis was performed using the standard protocol following bolus administration of intravenous contrast. CONTRAST:  134mL OMNIPAQUE IOHEXOL 300 MG/ML  SOLN COMPARISON:  February 08, 2020 FINDINGS: Lower chest: Mild areas of atelectasis are seen within the anterior aspect of the bilateral lung bases. Hepatobiliary: A 7.0 cm x 6.2 cm x 6.3 cm area of heterogeneous low attenuation is seen within the right lobe of the liver. This is increased in size when compared to the prior study. Status post cholecystectomy. No biliary dilatation. A 6.8 cm x 5.2 cm heterogeneous low-attenuation soft tissue mass is seen along the posterior aspect of the portacaval region. This is adjacent to the lateral aspect of the head of the pancreas and is increased in size when compared to the prior study. Pancreas: Unremarkable. No  pancreatic ductal dilatation or surrounding inflammatory changes. Spleen: Normal in size without focal abnormality. Adrenals/Urinary Tract: Adrenal glands are unremarkable. Kidneys are normal, without renal calculi, focal lesion, or hydronephrosis. Bladder is unremarkable. Stomach/Bowel: Stomach is within normal limits. Appendix appears normal. No evidence of bowel wall thickening, distention, or inflammatory changes. Vascular/Lymphatic: Mild aortic atherosclerosis. No enlarged abdominal or pelvic lymph nodes. Reproductive: Prostate is unremarkable. Other: No abdominal wall hernia or abnormality. No abdominopelvic ascites. Musculoskeletal: No acute or significant osseous findings. IMPRESSION: 1. Interval increase in size of a metastatic liver lesion since the prior study. 2. Enlarging portacaval lymph node, as described above. 3. Status post cholecystectomy. 4.  Mild aortic atherosclerosis. Aortic Atherosclerosis (ICD10-I70.0). Electronically Signed   By: Virgina Norfolk M.D.   On: 04/11/2020 19:41       Discharge Exam: Vitals:   04/14/20 2138 04/15/20 0544  BP: (!) 146/99 (!) 145/98  Pulse: 82 75  Resp: 18 16  Temp: 97.7 F (36.5 C) 97.6 F (36.4 C)  SpO2: 96% 96%    General: Pt is alert, awake, not in acute distress Cardiovascular: RRR, S1/S2 +, no edema Respiratory: CTA bilaterally, no wheezing, no rhonchi, no respiratory distress, no conversational dyspnea  Abdominal: Soft, NT, ND, bowel sounds + Extremities: no edema, no cyanosis Psych: Normal mood and affect, stable judgement and insight     The results of significant diagnostics from this hospitalization (including imaging, microbiology, ancillary and laboratory) are listed below for reference.     Microbiology: No results found for this or any previous visit (from the past 240 hour(s)).   Labs: BNP (last 3 results) No results for input(s): BNP in the last 8760 hours. Basic Metabolic Panel: Recent Labs  Lab 04/11/20 1705  04/12/20 0329 04/13/20 0500 04/14/20 0430 04/15/20 0500  NA 135 137 137 138 139  K 3.8 3.9 4.2 4.0 4.0  CL 99 101 100 100 103  CO2 25 26 28 28 26   GLUCOSE 97 112* 180* 167* 116*  BUN 15 13 17  23* 25*  CREATININE 0.99 0.89 0.75 0.83 0.78  CALCIUM 9.1 9.1 9.2 9.1 9.1   Liver Function Tests: Recent Labs  Lab 04/11/20 1705 04/12/20 0329 04/13/20 0500 04/14/20 0430 04/15/20 0500  AST 26 24 18 16 18   ALT 20 18 16 16 18   ALKPHOS 118 109 110 97 87  BILITOT 1.0 0.7 0.7 0.4 0.5  PROT 6.7 5.8* 6.2* 6.1* 6.0*  ALBUMIN 3.7 3.0* 3.0* 3.1* 3.2*   Recent Labs  Lab 04/11/20 1705  LIPASE 22   Recent Labs  Lab 04/11/20 1705  AMMONIA <9*   CBC: Recent Labs  Lab 04/08/20 1242 04/11/20 1705 04/12/20 0329 04/13/20 0500 04/14/20 0430 04/15/20 0500  WBC 5.2 6.7 4.9 6.9 15.4* 14.0*  NEUTROABS 3.8  --   --   --  13.9* 11.5*  HGB 9.6* 9.3* 8.8* 9.3* 9.1* 9.2*  HCT 30.6* 30.4* 28.5* 29.9* 29.2* 29.4*  MCV 90.0 93.3 91.9 90.9 93.0 92.5  PLT 370 445* 376 482* 514* 449*   Cardiac Enzymes: No results for input(s): CKTOTAL, CKMB, CKMBINDEX, TROPONINI in the last 168 hours. BNP: Invalid input(s): POCBNP CBG: No results for input(s): GLUCAP in the last 168 hours. D-Dimer No results for input(s): DDIMER in the last 72 hours. Hgb A1c No results for input(s): HGBA1C in the last 72 hours. Lipid Profile No results for input(s): CHOL, HDL, LDLCALC, TRIG, CHOLHDL, LDLDIRECT in the last 72 hours. Thyroid function studies No results for input(s): TSH, T4TOTAL, T3FREE, THYROIDAB in the last 72 hours.  Invalid input(s): FREET3 Anemia work up No results for input(s): VITAMINB12, FOLATE, FERRITIN, TIBC, IRON, RETICCTPCT in the last 72 hours. Urinalysis    Component Value Date/Time   COLORURINE YELLOW 02/08/2020 1710   APPEARANCEUR CLEAR 02/08/2020 1710   LABSPEC 1.025 02/08/2020 1710   PHURINE 5.0 02/08/2020 1710   GLUCOSEU NEGATIVE 02/08/2020 1710   HGBUR NEGATIVE 02/08/2020 1710    BILIRUBINUR NEGATIVE 02/08/2020 1710   BILIRUBINUR negative 06/11/2013 1309   KETONESUR NEGATIVE 02/08/2020 1710   PROTEINUR NEGATIVE 02/08/2020 1710   UROBILINOGEN 0.2 06/12/2013 0149   NITRITE NEGATIVE 02/08/2020 1710   LEUKOCYTESUR NEGATIVE 02/08/2020  12   Sepsis Labs Invalid input(s): PROCALCITONIN,  WBC,  LACTICIDVEN Microbiology No results found for this or any previous visit (from the past 240 hour(s)).   Patient was seen and examined on the day of discharge and was found to be in stable condition. Time coordinating discharge: 35 minutes including assessment and coordination of care, as well as examination of the patient.   SIGNED:  Dessa Phi, DO [facility] 04/15/2020, 10:21 AM

## 2020-04-15 NOTE — Progress Notes (Signed)
[name] is feeling better this morning.  His says he is not having that much pain.  Maybe, the Duragesic patch is starting to help a little bit.  He walked yesterday.  He ate okay with no nausea or vomiting.  He had a bowel movement.  He is ready for radiation therapy today.  There is been no obvious bleeding.  He has had no cough.  He has had no leg swelling.  There has  been no fever.  His labs show white cell count 14 hemoglobin 9.2.  Platelet count 449,000.  His BUN is 25 creatinine 0.78.  He has had no rashes.  He has had no headache.  His physical exam shows a temperature 97.6.  Pulse 75.  Blood pressure 145/98.  His lungs are clear.  Cardiac exam regular rate and rhythm.  Oral exam shows no thrush.  Abdomen is soft.  He is obese.  Bowel sounds are present.  There is really no tenderness to palpation in the right upper quadrant.  Extremities shows no clubbing, cyanosis or edema.  He will start radiation therapy today.  Hopefully he will be able to go home afterwards.  He is pretty stable from my point of view.  I am appreciative of the wonderful care that he is received from all the staff up on 3 W./3 E.  Lattie Haw, MD  [address] [place] 2:3

## 2020-04-15 NOTE — Progress Notes (Signed)
Patient was given discharge orders, and all questions were answered.  Patient was stable for discharge and taken to the main exit by wheelchair.

## 2020-04-16 ENCOUNTER — Ambulatory Visit: Payer: 59 | Admitting: Family

## 2020-04-16 ENCOUNTER — Other Ambulatory Visit: Payer: 59

## 2020-04-16 ENCOUNTER — Ambulatory Visit
Admission: RE | Admit: 2020-04-16 | Discharge: 2020-04-16 | Disposition: A | Discharge status: Home / Self Care | Payer: 59 | Source: Ambulatory Visit | Attending: Radiation Oncology | Admitting: Radiation Oncology

## 2020-04-16 ENCOUNTER — Ambulatory Visit: Payer: 59

## 2020-04-16 ENCOUNTER — Other Ambulatory Visit: Payer: Self-pay | Admitting: *Deleted

## 2020-04-16 DIAGNOSIS — C787 Secondary malignant neoplasm of liver and intrahepatic bile duct: Secondary | ICD-10-CM | POA: Insufficient documentation

## 2020-04-16 DIAGNOSIS — Z510 Encounter for antineoplastic radiation therapy: Secondary | ICD-10-CM | POA: Insufficient documentation

## 2020-04-16 DIAGNOSIS — C73 Malignant neoplasm of thyroid gland: Secondary | ICD-10-CM | POA: Insufficient documentation

## 2020-04-16 DIAGNOSIS — C772 Secondary and unspecified malignant neoplasm of intra-abdominal lymph nodes: Secondary | ICD-10-CM | POA: Diagnosis not present

## 2020-04-16 NOTE — Patient Outreach (Signed)
[place] [facility]) Care Management  04/16/2020  Brian [name] October 18, 1975 [id number]   Transition of care telephone call  Referral received:04/15/20 Initial outreach:04/16/20 Insurance: [facility]   Initial unsuccessful telephone call to patient's preferred number in order to complete transition of care assessment; no answer, left HIPAA compliant voicemail message requesting return call.   Objective: Per the electronic medical record, [name]   was hospitalized at [facility] for Intractable Abdominal pain 2/24-2/28/22. /PMHx: Primary cancer of thyroid with metastasis liver, hypertension , pulmonary embolism, Chronic diastolic heart failure.  He was discharged to home on 04/15/20 without the need for home health services or durable medical equipment per the discharge summary.   Plan: This RNCM will route unsuccessful outreach letter with [place] Management pamphlet and 24 hour Nurse Advice Line Magnet to [place] Management clinical pool to be mailed to patient's home address. This RNCM will attempt another outreach within 4 business days.  Joylene Draft, RN, BSN  [place] Management Coordinator  [phone]- Mobile [phone]- Toll Free Main Office

## 2020-04-17 ENCOUNTER — Other Ambulatory Visit: Payer: Self-pay

## 2020-04-17 ENCOUNTER — Ambulatory Visit
Admission: RE | Admit: 2020-04-17 | Discharge: 2020-04-17 | Disposition: A | Discharge status: Home / Self Care | Payer: 59 | Source: Ambulatory Visit | Attending: Radiation Oncology | Admitting: Radiation Oncology

## 2020-04-17 DIAGNOSIS — C787 Secondary malignant neoplasm of liver and intrahepatic bile duct: Secondary | ICD-10-CM | POA: Diagnosis not present

## 2020-04-17 DIAGNOSIS — C73 Malignant neoplasm of thyroid gland: Secondary | ICD-10-CM | POA: Diagnosis not present

## 2020-04-17 DIAGNOSIS — Z510 Encounter for antineoplastic radiation therapy: Secondary | ICD-10-CM | POA: Diagnosis not present

## 2020-04-17 DIAGNOSIS — C772 Secondary and unspecified malignant neoplasm of intra-abdominal lymph nodes: Secondary | ICD-10-CM | POA: Diagnosis not present

## 2020-04-18 ENCOUNTER — Ambulatory Visit
Admission: RE | Admit: 2020-04-18 | Discharge: 2020-04-18 | Disposition: A | Discharge status: Home / Self Care | Payer: 59 | Source: Ambulatory Visit | Attending: Radiation Oncology | Admitting: Radiation Oncology

## 2020-04-18 DIAGNOSIS — C787 Secondary malignant neoplasm of liver and intrahepatic bile duct: Secondary | ICD-10-CM | POA: Diagnosis not present

## 2020-04-18 DIAGNOSIS — Z510 Encounter for antineoplastic radiation therapy: Secondary | ICD-10-CM | POA: Diagnosis not present

## 2020-04-18 DIAGNOSIS — C73 Malignant neoplasm of thyroid gland: Secondary | ICD-10-CM | POA: Diagnosis not present

## 2020-04-18 DIAGNOSIS — C772 Secondary and unspecified malignant neoplasm of intra-abdominal lymph nodes: Secondary | ICD-10-CM | POA: Diagnosis not present

## 2020-04-19 ENCOUNTER — Other Ambulatory Visit: Payer: Self-pay | Admitting: *Deleted

## 2020-04-19 ENCOUNTER — Encounter: Payer: Self-pay | Admitting: *Deleted

## 2020-04-19 ENCOUNTER — Telehealth: Payer: Self-pay | Admitting: *Deleted

## 2020-04-19 ENCOUNTER — Ambulatory Visit
Admission: RE | Admit: 2020-04-19 | Discharge: 2020-04-19 | Disposition: A | Discharge status: Home / Self Care | Payer: 59 | Source: Ambulatory Visit | Attending: Radiation Oncology | Admitting: Radiation Oncology

## 2020-04-19 DIAGNOSIS — C772 Secondary and unspecified malignant neoplasm of intra-abdominal lymph nodes: Secondary | ICD-10-CM | POA: Diagnosis not present

## 2020-04-19 DIAGNOSIS — Z510 Encounter for antineoplastic radiation therapy: Secondary | ICD-10-CM | POA: Diagnosis not present

## 2020-04-19 DIAGNOSIS — C73 Malignant neoplasm of thyroid gland: Secondary | ICD-10-CM | POA: Diagnosis not present

## 2020-04-19 DIAGNOSIS — C787 Secondary malignant neoplasm of liver and intrahepatic bile duct: Secondary | ICD-10-CM | POA: Diagnosis not present

## 2020-04-19 NOTE — Telephone Encounter (Signed)
Patient called to ask if he needed to come in Monday to see Dr Marin Olp since he was scheduled for RT.  Dr Marin Olp said ok to cancel these appts and see Dr Marin Olp after his Radiation is complete

## 2020-04-19 NOTE — Patient Outreach (Signed)
[place] [facility]) Care Management  04/19/2020  Brian [name] 1975/08/27 [id number]   Transition of care call/case closure   Referral received:04/15/20 Initial outreach:04/16/20 Insurance: [place] [employer]    Subjective: 2nd attempt  successful telephone call to patient's preferred number in order to complete transition of care assessment; 2 HIPAA identifiers verified. Explained purpose of call and completed transition of care assessment.  Brian [name] states that he is feeling  terrible today due to nausea, he denies having vomited, but has been on the verge.He reports decreased intake this week, had smoothie on yesterday, finally able to keep down a slice of toast. He reports having medication prescribed for nausea but it is not helping. He plans to notify Dr.Ennever on today for further recommendations. Patient does not feel up to talking further at this time Spouse assisting with his recovery.   Reviewed accessing the following [place] [employer] : He  uses a [facility] outpatient pharmacy at [place] .    Objective:  Brian [name]   was hospitalized at [facility] for Intractable Abdominal pain 2/24-2/28/22. /PMHx: Primary cancer of thyroid with metastasis liver, hypertension , pulmonary embolism, Chronic diastolic heart failure.  He was discharged to home on 04/15/20 without the need for home health services or durable medical equipment per the discharge summary.   Assessment:  Patient voices good understanding of all discharge instructions.  See transition of care flowsheet for assessment details.   Plan:  Reviewed hospital discharge diagnosis of Intractable Abdominal pain,  Thyroid Cancer with liver metastases  and discharge treatment plan using hospital discharge instructions, assessing medication adherence, reviewing problems requiring provider notification, and discussing the importance of follow up with  primary care provider and/or specialists as  directed.   Patient agreeable to follow up call in the next week for transition of care assessment .    Joylene Draft, RN, BSN  [place] Management Coordinator  [phone]- Mobile [phone]- Toll Free Main Office

## 2020-04-22 ENCOUNTER — Inpatient Hospital Stay: Payer: 59

## 2020-04-22 ENCOUNTER — Inpatient Hospital Stay (HOSPITAL_BASED_OUTPATIENT_CLINIC_OR_DEPARTMENT_OTHER): Payer: 59 | Admitting: Hematology & Oncology

## 2020-04-22 ENCOUNTER — Inpatient Hospital Stay: Payer: 59 | Attending: Hematology & Oncology

## 2020-04-22 ENCOUNTER — Other Ambulatory Visit: Payer: Self-pay

## 2020-04-22 ENCOUNTER — Other Ambulatory Visit (HOSPITAL_BASED_OUTPATIENT_CLINIC_OR_DEPARTMENT_OTHER): Payer: Self-pay | Admitting: Hematology & Oncology

## 2020-04-22 ENCOUNTER — Ambulatory Visit
Admission: RE | Admit: 2020-04-22 | Discharge: 2020-04-22 | Disposition: A | Discharge status: Home / Self Care | Payer: 59 | Source: Ambulatory Visit | Attending: Radiation Oncology | Admitting: Radiation Oncology

## 2020-04-22 ENCOUNTER — Other Ambulatory Visit: Payer: Self-pay | Admitting: *Deleted

## 2020-04-22 ENCOUNTER — Other Ambulatory Visit: Payer: Self-pay | Admitting: Family

## 2020-04-22 ENCOUNTER — Other Ambulatory Visit: Payer: Self-pay | Admitting: Oncology

## 2020-04-22 ENCOUNTER — Inpatient Hospital Stay: Payer: 59 | Admitting: Hematology & Oncology

## 2020-04-22 ENCOUNTER — Encounter: Payer: Self-pay | Admitting: Hematology & Oncology

## 2020-04-22 VITALS — BP 135/87 | HR 94 | Temp 98.7°F | Resp 18

## 2020-04-22 VITALS — Wt 278.0 lb

## 2020-04-22 DIAGNOSIS — C787 Secondary malignant neoplasm of liver and intrahepatic bile duct: Secondary | ICD-10-CM | POA: Diagnosis not present

## 2020-04-22 DIAGNOSIS — C73 Malignant neoplasm of thyroid gland: Secondary | ICD-10-CM

## 2020-04-22 DIAGNOSIS — D509 Iron deficiency anemia, unspecified: Secondary | ICD-10-CM | POA: Diagnosis not present

## 2020-04-22 DIAGNOSIS — C772 Secondary and unspecified malignant neoplasm of intra-abdominal lymph nodes: Secondary | ICD-10-CM | POA: Diagnosis not present

## 2020-04-22 DIAGNOSIS — C799 Secondary malignant neoplasm of unspecified site: Secondary | ICD-10-CM

## 2020-04-22 DIAGNOSIS — C779 Secondary and unspecified malignant neoplasm of lymph node, unspecified: Secondary | ICD-10-CM | POA: Diagnosis not present

## 2020-04-22 DIAGNOSIS — R110 Nausea: Secondary | ICD-10-CM | POA: Insufficient documentation

## 2020-04-22 DIAGNOSIS — E860 Dehydration: Secondary | ICD-10-CM

## 2020-04-22 LAB — CMP (CANCER CENTER ONLY)
ALT: 14 U/L (ref 0–44)
AST: 14 U/L — ABNORMAL LOW (ref 15–41)
Albumin: 3.8 g/dL (ref 3.5–5.0)
Alkaline Phosphatase: 140 U/L — ABNORMAL HIGH (ref 38–126)
Anion gap: 7 (ref 5–15)
BUN: 12 mg/dL (ref 6–20)
CO2: 30 mmol/L (ref 22–32)
Calcium: 9.8 mg/dL (ref 8.9–10.3)
Chloride: 98 mmol/L (ref 98–111)
Creatinine: 0.9 mg/dL (ref 0.61–1.24)
GFR, Estimated: >60 mL/min (ref >60)
Glucose, Bld: 113 mg/dL — ABNORMAL HIGH (ref 70–99)
Potassium: 4.3 mmol/L (ref 3.5–5.1)
Sodium: 135 mmol/L (ref 135–145)
Total Bilirubin: 0.6 mg/dL (ref 0.3–1.2)
Total Protein: 6.5 g/dL (ref 6.5–8.1)

## 2020-04-22 LAB — CBC WITH DIFFERENTIAL (CANCER CENTER ONLY)
Abs Immature Granulocytes: 0.04 10*3/uL (ref 0.00–0.07)
Basophils Absolute: 0 10*3/uL (ref 0.0–0.1)
Basophils Relative: 0 %
Eosinophils Absolute: 0 10*3/uL (ref 0.0–0.5)
Eosinophils Relative: 0 %
HCT: 30.8 % — ABNORMAL LOW (ref 39.0–52.0)
Hemoglobin: 9.9 g/dL — ABNORMAL LOW (ref 13.0–17.0)
Immature Granulocytes: 1 %
Lymphocytes Relative: 7 %
Lymphs Abs: 0.4 10*3/uL — ABNORMAL LOW (ref 0.7–4.0)
MCH: 28.4 pg (ref 26.0–34.0)
MCHC: 32.1 g/dL (ref 30.0–36.0)
MCV: 88.3 fL (ref 80.0–100.0)
Monocytes Absolute: 0.8 10*3/uL (ref 0.1–1.0)
Monocytes Relative: 13 %
Neutro Abs: 4.6 10*3/uL (ref 1.7–7.7)
Neutrophils Relative %: 79 %
Platelet Count: 204 10*3/uL (ref 150–400)
RBC: 3.49 MIL/uL — ABNORMAL LOW (ref 4.22–5.81)
RDW: 17.9 % — ABNORMAL HIGH (ref 11.5–15.5)
WBC Count: 5.9 10*3/uL (ref 4.0–10.5)
nRBC: 0 % (ref 0.0–0.2)

## 2020-04-22 MED ORDER — SODIUM CHLORIDE 0.9 % IV SOLN
150.0000 mg | Freq: Once | INTRAVENOUS | Status: AC
  Administered 2020-04-22: 150 mg via INTRAVENOUS
  Filled 2020-04-22: qty 150

## 2020-04-22 MED ORDER — DRONABINOL 5 MG PO CAPS: 5.0000 mg | ORAL_CAPSULE | Freq: Two times a day (BID) | ORAL | 0 refills | Status: DC

## 2020-04-22 MED ORDER — BACLOFEN 10 MG PO TABS: 10.0000 mg | ORAL_TABLET | Freq: Three times a day (TID) | ORAL | 0 refills | Status: DC

## 2020-04-22 MED ORDER — FAMOTIDINE 40 MG PO TABS: 40.0000 mg | ORAL_TABLET | Freq: Two times a day (BID) | ORAL | 4 refills | Status: DC

## 2020-04-22 MED ORDER — LORAZEPAM 0.5 MG PO TABS: ORAL_TABLET | ORAL | 0 refills | Status: DC

## 2020-04-22 MED ORDER — METOCLOPRAMIDE HCL 5 MG/ML IJ SOLN
20.0000 mg | Freq: Once | INTRAVENOUS | Status: AC
  Administered 2020-04-22: 20 mg via INTRAVENOUS
  Filled 2020-04-22: qty 4

## 2020-04-22 MED ORDER — SODIUM CHLORIDE 0.9 % IV SOLN
1000.0000 mL | INTRAVENOUS | Status: AC
  Administered 2020-04-22: 1000 mL via INTRAVENOUS
  Filled 2020-04-22 (×2): qty 1000

## 2020-04-22 MED ORDER — SODIUM CHLORIDE 0.9 % IV SOLN
40.0000 mg | Freq: Once | INTRAVENOUS | Status: AC
  Administered 2020-04-22: 40 mg via INTRAVENOUS
  Filled 2020-04-22: qty 4

## 2020-04-22 MED ORDER — CYCLOBENZAPRINE HCL 10 MG PO TABS: 10.0000 mg | ORAL_TABLET | Freq: Three times a day (TID) | ORAL | 1 refills | Status: DC | PRN

## 2020-04-22 NOTE — Progress Notes (Signed)
Hematology and Oncology Follow Up Visit  Brian [name] [id number] 02-01-1976 45 y.o. 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  He has been getting radiation therapy for a liver met and a lymph node metastasis.  He was in the hospital recently because of pain.  We found that he did have progressive disease.  We had him on a Duragesic patch which was working and but his insurance has not approving this for what ever reason, I do not know.  He does not feel well.  He feels dehydrated.  He has no energy.  He cannot eat.  He just feels nauseated all the time.  I will try him on Marinol.  I will hopefully try 5 mg twice daily for the Marinol.  We will try to give him some IV Pepcid in the office.  I will give him some IV Reglan and some IV Emend.  Hopefully all this will help with the nausea.  Clearly, he is showing signs of progression.  He just does not feel well.  This certainly could be from radiation to the liver.  Could also be from the cancer itself.  I just want quality of life to be the goal for him.  He has had no fever.  He has had no diarrhea.  He really does not have much in the way of bowel movements.  There is been no bleeding.  Currently, his performance status is ECOG 2.    Medications:  Allergies as of 04/22/2020      Reactions   Dextromethorphan-guaifenesin Other (See Comments)   Irregular  heartbeat   Doxycycline Anaphylaxis, Nausea And Vomiting, Rash, Other (See Comments)   "heart arrythmia" and "dyspepsia" (only oral doxycycline causes reaction)   Gadobutrol Hives, Other (See Comments)   Patient had MRI scan at [place]. Patient called one hour after he left imaging facility to report two "blisters" that came up on "back" of lip.    Gadolinium Derivatives Hives   Patient had MRI scan at [place]. Patient called one hour after he left imaging facility to report two "blisters" that came up on "back" of lip.    Guaifenesin Palpitations      Ibuprofen Hives, Itching   Lisinopril Other (See Comments)   Angioedema   Pantoprazole Itching   Tramadol Hives, Itching   Barium Rash   Developed redness around neck after drinking 1st bottle of Barocat; pt was given Benedryl by ED Mds to be "on the safe side" before drinking 2nd bottle   Chlorhexidine Hives      Medication List       Accurate as of April 22, 2020 12:24 PM. If you have any questions, ask your nurse or doctor.        apixaban 5 MG Tabs tablet Commonly known as: ELIQUIS Take 1 tablet (5 mg total) by mouth 2 (two) times  daily.   baclofen 10 MG tablet Commonly known as: LIORESAL Take 1 tablet (10 mg total) by mouth 3 (three) times daily.   cyclobenzaprine 10 MG tablet Commonly known as: FLEXERIL Take 1 tablet (10 mg total) by mouth 3 (three) times daily as needed.   dronabinol 5 MG capsule Commonly known as: MARINOL Take 1 capsule (5 mg total) by mouth 2 (two) times daily before a meal. Started by: [name], MD   EPINEPHrine 0.3 mg/0.3 mL Soaj injection Commonly known as: EpiPen 2-Pak USE AS DIRECTED FOR LIFE THREATENING ALLERGIC REACTIONS   famotidine 40 MG tablet Commonly known as: PEPCID Take 1 tablet (40 mg total) by mouth 2 (two) times daily. Started by: [name], MD   fentaNYL 75 MCG/HR Commonly known as: Crown 1 patch onto the skin every 3 (three)  days.   furosemide 80 MG tablet Commonly known as: LASIX TAKE 1 TABLET (80 MG TOTAL) BY MOUTH DAILY.   gabapentin 300 MG capsule Commonly known as: NEURONTIN TAKE 1 CAPSULE (300 MG TOTAL) BY MOUTH 3 (THREE) TIMES DAILY.   HYDROmorphone 4 MG tablet Commonly known as: DILAUDID Take 1 tablet (4 mg total) by mouth every 4 (four) hours as needed for severe pain.   levothyroxine 88 MCG tablet Commonly known as: SYNTHROID Take 88 mcg by mouth daily before breakfast.   levothyroxine 200 MCG tablet Commonly known as: SYNTHROID Take 1 tablet (200 mcg total) by mouth daily. Take along with 88 mcg=288 mcg   LORazepam 0.5 MG tablet Commonly known as: ATIVAN TAKE 1 TABLET BY MOUTH EVERY 6 HOURS AS NEEDED FOR NAUSEA & VOMITING   methylphenidate 10 MG tablet Commonly known as: RITALIN TAKE 1 TABLET (10 MG TOTAL) BY MOUTH 2 (TWO) TIMES DAILY. What changed: when to take this   metoCLOPramide 10 MG tablet Commonly known as: REGLAN Take 1 tablet (10 mg total) by mouth every 6 (six) hours as needed for nausea or vomiting.   metolazone 5 MG tablet Commonly known as: ZAROXOLYN TAKE 1 TABLET (5 MG TOTAL) BY MOUTH DAILY. TAKE 1 HOUR BEFORE LASIX.   multivitamin capsule Take 1 capsule by mouth daily.   OLANZapine 10 MG tablet Commonly known as: ZYPREXA Take 10 mg by mouth at bedtime.   polyethylene glycol 17 g packet Commonly known as: MIRALAX / GLYCOLAX Take 17 g by mouth daily as needed for mild constipation.   potassium chloride 10 MEQ tablet Commonly known as: KLOR-CON TAKE 1 TABLET BY MOUTH ONCE DAILY   senna-docusate 8.6-50 MG tablet Commonly known as: Senokot-S Take 1 tablet by mouth at bedtime as needed for mild constipation.       Allergies:  Allergies  Allergen Reactions  . Dextromethorphan-Guaifenesin Other (See Comments)    Irregular heartbeat   . Doxycycline Anaphylaxis, Nausea And Vomiting, Rash and Other (See Comments)    "heart arrythmia" and  "dyspepsia" (only oral doxycycline causes reaction)  . Gadobutrol Hives and Other (See Comments)    Patient had MRI scan at [place]. Patient called one hour after he left imaging facility to report two "blisters" that came up on "back" of lip.    . Gadolinium Derivatives Hives    Patient had MRI scan at [place]. Patient called one hour after he left imaging facility to report two "blisters" that came up on "back" of lip.   . Guaifenesin Palpitations       . Ibuprofen Hives and Itching  . Lisinopril Other (See Comments)  Angioedema  . Pantoprazole Itching  . Tramadol Hives and Itching  . Barium Rash    Developed redness around neck after drinking 1st bottle of Barocat; pt was given Benedryl by ED Mds to be "on the safe side" before drinking 2nd bottle  . Chlorhexidine Hives    Past Medical History, Surgical history, Social history, and Family History were reviewed and updated.  Review of Systems: Review of Systems  Constitutional: Positive for malaise/fatigue.  HENT: Negative.   Eyes: Negative.   Respiratory: Negative.   Cardiovascular: Negative.   Gastrointestinal: Positive for abdominal pain, constipation, heartburn and nausea.  Genitourinary: Negative.   Musculoskeletal: Negative.   Skin: Negative.   Neurological: Negative.   Endo/Heme/Allergies: Negative.   Psychiatric/Behavioral: Negative.      Physical Exam:  weight is 278 lb (126.1 kg).   Wt Readings from Last 3 Encounters:  04/22/20 278 lb (126.1 kg)  04/11/20 290 lb (131.5 kg)  04/08/20 290 lb (131.5 kg)    Physical Exam Vitals reviewed.  HENT:     Head: Normocephalic and atraumatic.     Mouth/Throat:     Mouth: Oropharynx is clear and moist.  Eyes:     Extraocular Movements: EOM normal.     Pupils: Pupils are equal, round, and reactive to light.  Cardiovascular:     Rate and Rhythm: Normal rate and regular rhythm.     Heart sounds: Normal heart sounds.  Pulmonary:     Effort:  Pulmonary effort is normal.     Breath sounds: Normal breath sounds.  Abdominal:     General: Bowel sounds are normal.     Palpations: Abdomen is soft.  Musculoskeletal:        General: No tenderness, deformity or edema. Normal range of motion.     Cervical back: Normal range of motion.  Lymphadenopathy:     Cervical: No cervical adenopathy.  Skin:    General: Skin is warm and dry.     Findings: No erythema or rash.  Neurological:     Mental Status: He is alert and oriented to person, place, and time.  Psychiatric:        Mood and Affect: Mood and affect normal.        Behavior: Behavior normal.        Thought Content: Thought content normal.        Judgment: Judgment normal.      Lab Results  Component Value Date   WBC 14.0 (H) 04/15/2020   HGB 9.2 (L) 04/15/2020   HCT 29.4 (L) 04/15/2020   MCV 92.5 04/15/2020   PLT 449 (H) 04/15/2020   Lab Results  Component Value Date   FERRITIN 717 (H) 04/08/2020   IRON 21 (L) 04/08/2020   TIBC 204 04/08/2020   UIBC 183 04/08/2020   IRONPCTSAT 10 (L) 04/08/2020   Lab Results  Component Value Date   RETICCTPCT 1.7 04/08/2020   RBC 3.18 (L) 04/15/2020   No results found for: KPAFRELGTCHN, LAMBDASER, KAPLAMBRATIO No results found for: IGGSERUM, IGA, IGMSERUM No results found for: Kathrynn Ducking, MSPIKE, SPEI   Chemistry      Component Value Date/Time   NA 135 04/22/2020 1100   NA 141 02/06/2019 1210   K 4.3 04/22/2020 1100   CL 98 04/22/2020 1100   CO2 30 04/22/2020 1100   BUN 12 04/22/2020 1100   BUN 8 02/06/2019 1210   CREATININE 0.90 04/22/2020 1100      Component Value  Date/Time   CALCIUM 9.8 04/22/2020 1100   ALKPHOS 140 (H) 04/22/2020 1100   AST 14 (L) 04/22/2020 1100   ALT 14 04/22/2020 1100   BILITOT 0.6 04/22/2020 1100       Impression and Plan: Brian [name] is a very pleasant 45 yo caucasian gentleman with an unusual metastatic Hurthle cell tumor of the thyroid,  RET mutated.  Again, we are starting to run out of options for him.  He is getting radiation therapy right now.  I think he has another week or so left of radiation for the liver met in the lymph node metastasis.  I just want to get his quality of life little bit better.  I told him that he can come back anytime if he feels dehydrated.  Hopefully, the medications that we are adding will make a difference for him.  We will continue just to follow him along.  He will come back for his regular appointment.  As far as any other treatment goes, I am just not sure what we might be able to use as there is just not a lot of data out there for this kind a malignancy.  [name], MD 3/7/202212:24 PM

## 2020-04-22 NOTE — Patient Instructions (Signed)
Dehydration, Adult Dehydration is a condition in which there is not enough water or other fluids in the body. This happens when a person loses more fluids than he or she takes in. Important organs, such as the kidneys, brain, and heart, cannot function without a proper amount of fluids. Any loss of fluids from the body can lead to dehydration. Dehydration can be mild, moderate, or severe. It should be treated right away to prevent it from becoming severe. What are the causes? Dehydration may be caused by:  Conditions that cause loss of water or other fluids, such as diarrhea, vomiting, or sweating or urinating a lot.  Not drinking enough fluids, especially when you are ill or doing activities that require a lot of energy.  Other illnesses and conditions, such as fever or infection.  Certain medicines, such as medicines that remove excess fluid from the body (diuretics).  Lack of safe drinking water.  Not being able to get enough water and food. What increases the risk? The following factors may make you more likely to develop this condition:  Having a long-term (chronic) illness that has not been treated properly, such as diabetes, heart disease, or kidney disease.  Being 65 years of age or older.  Having a disability.  Living in a place that is high in altitude, where thinner, drier air causes more fluid loss.  Doing exercises that put stress on your body for a long time (endurance sports). What are the signs or symptoms? Symptoms of dehydration depend on how severe it is. Mild or moderate dehydration  Thirst.  Dry lips or dry mouth.  Dizziness or light-headedness, especially when standing up from a seated position.  Muscle cramps.  Dark urine. Urine may be the color of tea.  Less urine or tears produced than usual.  Headache. Severe dehydration  Changes in skin. Your skin may be cold and clammy, blotchy, or pale. Your skin also may not return to normal after being  lightly pinched and released.  Little or no tears, urine, or sweat.  Changes in vital signs, such as rapid breathing and low blood pressure. Your pulse may be weak or may be faster than 100 beats a minute when you are sitting still.  Other changes, such as: ? Feeling very thirsty. ? Sunken eyes. ? Cold hands and feet. ? Confusion. ? Being very tired (lethargic) or having trouble waking from sleep. ? Short-term weight loss. ? Loss of consciousness. How is this diagnosed? This condition is diagnosed based on your symptoms and a physical exam. You may have blood and urine tests to help confirm the diagnosis. How is this treated? Treatment for this condition depends on how severe it is. Treatment should be started right away. Do not wait until dehydration becomes severe. Severe dehydration is an emergency and needs to be treated in a hospital.  Mild or moderate dehydration can be treated at home. You may be asked to: ? Drink more fluids. ? Drink an oral rehydration solution (ORS). This drink helps restore proper amounts of fluids and salts and minerals in the blood (electrolytes).  Severe dehydration can be treated: ? With IV fluids. ? By correcting abnormal levels of electrolytes. This is often done by giving electrolytes through a tube that is passed through your nose and into your stomach (nasogastric tube, or NG tube). ? By treating the underlying cause of dehydration. Follow these instructions at home: Oral rehydration solution If told by your health care provider, drink an ORS:  Make   an ORS by following instructions on the package.  Start by drinking small amounts, about  cup (120 mL) every 5-10 minutes.  Slowly increase how much you drink until you have taken the amount recommended by your health care provider. Eating and drinking  Drink enough clear fluid to keep your urine pale yellow. If you were told to drink an ORS, finish the ORS first and then start slowly drinking  other clear fluids. Drink fluids such as: ? Water. Do not drink only water. Doing that can lead to hyponatremia, which is having too little salt (sodium) in the body. ? Water from ice chips you suck on. ? Fruit juice that you have added water to (diluted fruit juice). ? Low-calorie sports drinks.  Eat foods that contain a healthy balance of electrolytes, such as bananas, oranges, potatoes, tomatoes, and spinach.  Do not drink alcohol.  Avoid the following: ? Drinks that contain a lot of sugar. These include high-calorie sports drinks, fruit juice that is not diluted, and soda. ? Caffeine. ? Foods that are greasy or contain a lot of fat or sugar.         General instructions  Take over-the-counter and prescription medicines only as told by your health care provider.  Do not take sodium tablets. Doing that can lead to having too much sodium in the body (hypernatremia).  Return to your normal activities as told by your health care provider. Ask your health care provider what activities are safe for you.  Keep all follow-up visits as told by your health care provider. This is important. Contact a health care provider if:  You have muscle cramps, pain, or discomfort, such as: ? Pain in your abdomen and the pain gets worse or stays in one area (localizes). ? Stiff neck.  You have a rash.  You are more irritable than usual.  You are sleepier or have a harder time waking than usual.  You feel weak or dizzy.  You feel very thirsty. Get help right away if you have:  Any symptoms of severe dehydration.  Symptoms of vomiting, such as: ? You cannot eat or drink without vomiting. ? Vomiting gets worse or does not go away. ? Vomit includes blood or green matter (bile).  Symptoms that get worse with treatment.  A fever.  A severe headache.  Problems with urination or bowel movements, such as: ? Diarrhea that gets worse or does not go away. ? Blood in your stool (feces).  This may cause stool to look black and tarry. ? Not urinating, or urinating only a small amount of very dark urine, within 6-8 hours.  Trouble breathing. These symptoms may represent a serious problem that is an emergency. Do not wait to see if the symptoms will go away. Get medical help right away. Call your local emergency services (911 in the [place] Do not drive yourself to the hospital. Summary  Dehydration is a condition in which there is not enough water or other fluids in the body. This happens when a person loses more fluids than he or she takes in.  Treatment for this condition depends on how severe it is. Treatment should be started right away. Do not wait until dehydration becomes severe.  Drink enough clear fluid to keep your urine pale yellow. If you were told to drink an oral rehydration solution (ORS), finish the ORS first and then start slowly drinking other clear fluids.  Take over-the-counter and prescription medicines only as told by your health   care provider.  Get help right away if you have any symptoms of severe dehydration. This information is not intended to replace advice given to you by your health care provider. Make sure you discuss any questions you have with your health care provider. Document Revised: 09/15/2018 Document Reviewed: 09/15/2018 Elsevier Patient Education  2021 [employer] Inc[employer]  

## 2020-04-23 ENCOUNTER — Other Ambulatory Visit: Payer: Self-pay | Admitting: *Deleted

## 2020-04-23 ENCOUNTER — Telehealth: Payer: Self-pay | Admitting: *Deleted

## 2020-04-23 ENCOUNTER — Ambulatory Visit
Admission: RE | Admit: 2020-04-23 | Discharge: 2020-04-23 | Disposition: A | Discharge status: Home / Self Care | Payer: 59 | Source: Ambulatory Visit | Attending: Radiation Oncology | Admitting: Radiation Oncology

## 2020-04-23 ENCOUNTER — Encounter: Payer: Self-pay | Admitting: *Deleted

## 2020-04-23 DIAGNOSIS — C787 Secondary malignant neoplasm of liver and intrahepatic bile duct: Secondary | ICD-10-CM | POA: Diagnosis not present

## 2020-04-23 DIAGNOSIS — C772 Secondary and unspecified malignant neoplasm of intra-abdominal lymph nodes: Secondary | ICD-10-CM | POA: Diagnosis not present

## 2020-04-23 DIAGNOSIS — C73 Malignant neoplasm of thyroid gland: Secondary | ICD-10-CM | POA: Diagnosis not present

## 2020-04-23 DIAGNOSIS — Z510 Encounter for antineoplastic radiation therapy: Secondary | ICD-10-CM | POA: Diagnosis not present

## 2020-04-23 NOTE — Patient Outreach (Signed)
[facility]) Care Management  04/23/2020  HAZEL WRINKLE Nov 04, 1975 [id number]    Transition of care telephone follow up  call/case closure   Referral received:04/15/20 Initial outreach:04/16/20 Insurance: [employer]    Subjective: Successful follow up call to patient , he reports feeling better than last week. He reports some improvement , since new medications in place after provider visit to keep nausea controlled. He reports pain control as pretty good".  He reports that he has improvement with food/drink intake no vomiting or diarrhea.  Patient reports preparing for radiation treatment appointment of today.  Patient denies any new concerns for education or resource information .     Objective: Per the electronic medical record, Mr. Elo Marmolejos   was hospitalized at [facility] for Intractable Abdominal pain 2/24-2/28/22. /PMHx: Primary cancer of thyroid with metastasis liver, hypertension , pulmonary embolism, Chronic diastolic heart failure.  He was discharged to home on 04/15/20 without the need for home health services or durable medical equipment per the discharge summary  Plan Will plan case close, no care management care needs identified at this time.   Joylene Draft, RN, BSN  [place] Management Coordinator  [phone]- Mobile [phone]- Toll Free Main Office

## 2020-04-23 NOTE — Telephone Encounter (Signed)
No los 04/22/20 to schedule

## 2020-04-24 ENCOUNTER — Ambulatory Visit
Admission: RE | Admit: 2020-04-24 | Discharge: 2020-04-24 | Disposition: A | Discharge status: Home / Self Care | Payer: 59 | Source: Ambulatory Visit | Attending: Radiation Oncology | Admitting: Radiation Oncology

## 2020-04-24 DIAGNOSIS — C772 Secondary and unspecified malignant neoplasm of intra-abdominal lymph nodes: Secondary | ICD-10-CM | POA: Diagnosis not present

## 2020-04-24 DIAGNOSIS — C73 Malignant neoplasm of thyroid gland: Secondary | ICD-10-CM | POA: Diagnosis not present

## 2020-04-24 DIAGNOSIS — C787 Secondary malignant neoplasm of liver and intrahepatic bile duct: Secondary | ICD-10-CM | POA: Diagnosis not present

## 2020-04-24 DIAGNOSIS — Z510 Encounter for antineoplastic radiation therapy: Secondary | ICD-10-CM | POA: Diagnosis not present

## 2020-04-25 ENCOUNTER — Ambulatory Visit
Admission: RE | Admit: 2020-04-25 | Discharge: 2020-04-25 | Disposition: A | Discharge status: Home / Self Care | Payer: 59 | Source: Ambulatory Visit | Attending: Radiation Oncology | Admitting: Radiation Oncology

## 2020-04-25 ENCOUNTER — Other Ambulatory Visit: Payer: Self-pay

## 2020-04-25 DIAGNOSIS — Z510 Encounter for antineoplastic radiation therapy: Secondary | ICD-10-CM | POA: Diagnosis not present

## 2020-04-25 DIAGNOSIS — C772 Secondary and unspecified malignant neoplasm of intra-abdominal lymph nodes: Secondary | ICD-10-CM | POA: Diagnosis not present

## 2020-04-25 DIAGNOSIS — C73 Malignant neoplasm of thyroid gland: Secondary | ICD-10-CM | POA: Diagnosis not present

## 2020-04-25 DIAGNOSIS — C787 Secondary malignant neoplasm of liver and intrahepatic bile duct: Secondary | ICD-10-CM | POA: Diagnosis not present

## 2020-04-26 ENCOUNTER — Ambulatory Visit
Admission: RE | Admit: 2020-04-26 | Discharge: 2020-04-26 | Disposition: A | Discharge status: Home / Self Care | Payer: 59 | Source: Ambulatory Visit | Attending: Radiation Oncology | Admitting: Radiation Oncology

## 2020-04-26 ENCOUNTER — Encounter: Payer: Self-pay | Admitting: Urology

## 2020-04-26 DIAGNOSIS — C73 Malignant neoplasm of thyroid gland: Secondary | ICD-10-CM | POA: Diagnosis not present

## 2020-04-26 DIAGNOSIS — C772 Secondary and unspecified malignant neoplasm of intra-abdominal lymph nodes: Secondary | ICD-10-CM | POA: Diagnosis not present

## 2020-04-26 DIAGNOSIS — Z510 Encounter for antineoplastic radiation therapy: Secondary | ICD-10-CM | POA: Diagnosis not present

## 2020-04-26 DIAGNOSIS — C787 Secondary malignant neoplasm of liver and intrahepatic bile duct: Secondary | ICD-10-CM | POA: Diagnosis not present

## 2020-04-29 ENCOUNTER — Other Ambulatory Visit: Payer: Self-pay

## 2020-04-29 ENCOUNTER — Other Ambulatory Visit: Payer: Self-pay | Admitting: *Deleted

## 2020-04-29 ENCOUNTER — Inpatient Hospital Stay: Payer: 59

## 2020-04-29 ENCOUNTER — Other Ambulatory Visit: Payer: 59

## 2020-04-29 ENCOUNTER — Ambulatory Visit: Payer: 59 | Admitting: Hematology & Oncology

## 2020-04-29 ENCOUNTER — Ambulatory Visit: Payer: 59

## 2020-04-29 ENCOUNTER — Telehealth: Payer: Self-pay | Admitting: *Deleted

## 2020-04-29 DIAGNOSIS — C73 Malignant neoplasm of thyroid gland: Secondary | ICD-10-CM

## 2020-04-29 DIAGNOSIS — E860 Dehydration: Secondary | ICD-10-CM

## 2020-04-29 DIAGNOSIS — R110 Nausea: Secondary | ICD-10-CM | POA: Diagnosis not present

## 2020-04-29 DIAGNOSIS — C787 Secondary malignant neoplasm of liver and intrahepatic bile duct: Secondary | ICD-10-CM | POA: Diagnosis not present

## 2020-04-29 DIAGNOSIS — D509 Iron deficiency anemia, unspecified: Secondary | ICD-10-CM | POA: Diagnosis not present

## 2020-04-29 DIAGNOSIS — C779 Secondary and unspecified malignant neoplasm of lymph node, unspecified: Secondary | ICD-10-CM | POA: Diagnosis not present

## 2020-04-29 LAB — COMPREHENSIVE METABOLIC PANEL
ALT: 12 U/L (ref 0–44)
AST: 15 U/L (ref 15–41)
Albumin: 3.7 g/dL (ref 3.5–5.0)
Alkaline Phosphatase: 142 U/L — ABNORMAL HIGH (ref 38–126)
Anion gap: 8 (ref 5–15)
BUN: 18 mg/dL (ref 6–20)
CO2: 30 mmol/L (ref 22–32)
Calcium: 9.7 mg/dL (ref 8.9–10.3)
Chloride: 98 mmol/L (ref 98–111)
Creatinine, Ser: 0.92 mg/dL (ref 0.61–1.24)
GFR, Estimated: >60 mL/min (ref >60)
Glucose, Bld: 108 mg/dL — ABNORMAL HIGH (ref 70–99)
Potassium: 3.7 mmol/L (ref 3.5–5.1)
Sodium: 136 mmol/L (ref 135–145)
Total Bilirubin: 0.5 mg/dL (ref 0.3–1.2)
Total Protein: 6.3 g/dL — ABNORMAL LOW (ref 6.5–8.1)

## 2020-04-29 LAB — CBC WITH DIFFERENTIAL (CANCER CENTER ONLY)
Abs Immature Granulocytes: 0.02 10*3/uL (ref 0.00–0.07)
Basophils Absolute: 0 10*3/uL (ref 0.0–0.1)
Basophils Relative: 0 %
Eosinophils Absolute: 0 10*3/uL (ref 0.0–0.5)
Eosinophils Relative: 1 %
HCT: 29.1 % — ABNORMAL LOW (ref 39.0–52.0)
Hemoglobin: 9.3 g/dL — ABNORMAL LOW (ref 13.0–17.0)
Immature Granulocytes: 0 %
Lymphocytes Relative: 5 %
Lymphs Abs: 0.3 10*3/uL — ABNORMAL LOW (ref 0.7–4.0)
MCH: 28.1 pg (ref 26.0–34.0)
MCHC: 32 g/dL (ref 30.0–36.0)
MCV: 87.9 fL (ref 80.0–100.0)
Monocytes Absolute: 0.7 10*3/uL (ref 0.1–1.0)
Monocytes Relative: 13 %
Neutro Abs: 4 10*3/uL (ref 1.7–7.7)
Neutrophils Relative %: 81 %
Platelet Count: 236 10*3/uL (ref 150–400)
RBC: 3.31 MIL/uL — ABNORMAL LOW (ref 4.22–5.81)
RDW: 17.6 % — ABNORMAL HIGH (ref 11.5–15.5)
WBC Count: 5 10*3/uL (ref 4.0–10.5)
nRBC: 0 % (ref 0.0–0.2)

## 2020-04-29 MED ORDER — SODIUM CHLORIDE 0.9 % IV SOLN
40.0000 mg | Freq: Once | INTRAVENOUS | Status: AC
  Administered 2020-04-29: 40 mg via INTRAVENOUS
  Filled 2020-04-29: qty 4

## 2020-04-29 MED ORDER — SODIUM CHLORIDE 0.9 % IV SOLN
Freq: Once | INTRAVENOUS | Status: AC
  Filled 2020-04-29: qty 250

## 2020-04-29 MED ORDER — METOCLOPRAMIDE HCL 5 MG/ML IJ SOLN
20.0000 mg | Freq: Once | INTRAVENOUS | Status: AC
  Administered 2020-04-29: 20 mg via INTRAVENOUS
  Filled 2020-04-29: qty 4

## 2020-04-29 MED ORDER — HEPARIN SOD (PORK) LOCK FLUSH 100 UNIT/ML IV SOLN
500.0000 [IU] | Freq: Once | INTRAVENOUS | Status: AC
  Administered 2020-04-29: 500 [IU] via INTRAVENOUS
  Filled 2020-04-29: qty 5

## 2020-04-29 MED ORDER — HYDROMORPHONE HCL 4 MG/ML IJ SOLN: 4.0000 mg | Freq: Once | INTRAMUSCULAR | Status: DC

## 2020-04-29 MED ORDER — METOCLOPRAMIDE HCL 5 MG/ML IJ SOLN: 20.0000 mg | Freq: Once | INTRAVENOUS | Status: DC

## 2020-04-29 MED ORDER — HYDROMORPHONE HCL 4 MG/ML IJ SOLN
INTRAMUSCULAR | Status: AC
  Filled 2020-04-29: qty 1

## 2020-04-29 MED ORDER — SODIUM CHLORIDE 0.9% FLUSH
10.0000 mL | INTRAVENOUS | Status: DC | PRN
  Administered 2020-04-29: 10 mL via INTRAVENOUS
  Filled 2020-04-29: qty 10

## 2020-04-29 MED ORDER — FAMOTIDINE 200 MG/20ML IV SOLN
40.0000 mg | Freq: Once | INTRAVENOUS | Status: DC
Start: 2020-04-29 — End: 2020-04-29

## 2020-04-29 MED ORDER — HYDROMORPHONE HCL 4 MG/ML IJ SOLN
4.0000 mg | Freq: Once | INTRAMUSCULAR | Status: AC
  Administered 2020-04-29: 4 mg via INTRAVENOUS

## 2020-04-29 NOTE — Telephone Encounter (Signed)
Patient called and stated that he was having nausea and weakness.  Completed RT today. Would like to come in for fluids and IV medication.  Appt made for today

## 2020-04-29 NOTE — Patient Instructions (Signed)
Dehydration, Adult Dehydration is condition in which there is not enough water or other fluids in the body. This happens when a person loses more fluids than he or she takes in. Important body parts cannot work right without the right amount of fluids. Any loss of fluids from the body can cause dehydration. Dehydration can be mild, worse, or very bad. It should be treated right away to keep it from getting very bad. What are the causes? This condition may be caused by:  Conditions that cause loss of water or other fluids, such as: ? Watery poop (diarrhea). ? Vomiting. ? Sweating a lot. ? Peeing (urinating) a lot.  Not drinking enough fluids, especially when you: ? Are ill. ? Are doing things that take a lot of energy to do.  Other illnesses and conditions, such as fever or infection.  Certain medicines, such as medicines that take extra fluid out of the body (diuretics).  Lack of safe drinking water.  Not being able to get enough water and food. What increases the risk? The following factors may make you more likely to develop this condition:  Having a long-term (chronic) illness that has not been treated the right way, such as: ? Diabetes. ? Heart disease. ? Kidney disease.  Being 45 years of age or older.  Having a disability.  Living in a place that is high above the ground or sea (high in altitude). The thinner, dried air causes more fluid loss.  Doing exercises that put stress on your body for a long time. What are the signs or symptoms? Symptoms of dehydration depend on how bad it is. Mild or worse dehydration  Thirst.  Dry lips or dry mouth.  Feeling dizzy or light-headed, especially when you stand up from sitting.  Muscle cramps.  Your body making: ? Dark pee (urine). Pee may be the color of tea. ? Less pee than normal. ? Less tears than normal.  Headache. Very bad dehydration  Changes in skin. Skin may: ? Be cold to the touch (clammy). ? Be blotchy  or pale. ? Not go back to normal right after you lightly pinch it and let it go.  Little or no tears, pee, or sweat.  Changes in vital signs, such as: ? Fast breathing. ? Low blood pressure. ? Weak pulse. ? Pulse that is more than 100 beats a minute when you are sitting still.  Other changes, such as: ? Feeling very thirsty. ? Eyes that look hollow (sunken). ? Cold hands and feet. ? Being mixed up (confused). ? Being very tired (lethargic) or having trouble waking from sleep. ? Short-term weight loss. ? Loss of consciousness. How is this treated? Treatment for this condition depends on how bad it is. Treatment should start right away. Do not wait until your condition gets very bad. Very bad dehydration is an emergency. You will need to go to a hospital.  Mild or worse dehydration can be treated at home. You may be asked to: ? Drink more fluids. ? Drink an oral rehydration solution (ORS). This drink helps get the right amounts of fluids and salts and minerals in the blood (electrolytes).  Very bad dehydration can be treated: ? With fluids through an IV tube. ? By getting normal levels of salts and minerals in your blood. This is often done by giving salts and minerals through a tube. The tube is passed through your nose and into your stomach. ? By treating the root cause. Follow these instructions at   home: Oral rehydration solution If told by your doctor, drink an ORS:  Make an ORS. Use instructions on the package.  Start by drinking small amounts, about  cup (120 mL) every 5-10 minutes.  Slowly drink more until you have had the amount that your doctor said to have. Eating and drinking  Drink enough clear fluid to keep your pee pale yellow. If you were told to drink an ORS, finish the ORS first. Then, start slowly drinking other clear fluids. Drink fluids such as: ? Water. Do not drink only water. Doing that can make the salt (sodium) level in your body get too low. ? Water  from ice chips you suck on. ? Fruit juice that you have added water to (diluted). ? Low-calorie sports drinks.  Eat foods that have the right amounts of salts and minerals, such as: ? Bananas. ? Oranges. ? Potatoes. ? Tomatoes. ? Spinach.  Do not drink alcohol.  Avoid: ? Drinks that have a lot of sugar. These include:  High-calorie sports drinks.  Fruit juice that you did not add water to.  Soda.  Caffeine. ? Foods that are greasy or have a lot of fat or sugar.         General instructions  Take over-the-counter and prescription medicines only as told by your doctor.  Do not take salt tablets. Doing that can make the salt level in your body get too high.  Return to your normal activities as told by your doctor. Ask your doctor what activities are safe for you.  Keep all follow-up visits as told by your doctor. This is important. Contact a doctor if:  You have pain in your belly (abdomen) and the pain: ? Gets worse. ? Stays in one place.  You have a rash.  You have a stiff neck.  You get angry or annoyed (irritable) more easily than normal.  You are more tired or have a harder time waking than normal.  You feel: ? Weak or dizzy. ? Very thirsty. Get help right away if you have:  Any symptoms of very bad dehydration.  Symptoms of vomiting, such as: ? You cannot eat or drink without vomiting. ? Your vomiting gets worse or does not go away. ? Your vomit has blood or green stuff in it.  Symptoms that get worse with treatment.  A fever.  A very bad headache.  Problems with peeing or pooping (having a bowel movement), such as: ? Watery poop that gets worse or does not go away. ? Blood in your poop (stool). This may cause poop to look black and tarry. ? Not peeing in 6-8 hours. ? Peeing only a small amount of very dark pee in 6-8 hours.  Trouble breathing. These symptoms may be an emergency. Do not wait to see if the symptoms will go away. Get  medical help right away. Call your local emergency services (911 in the [place] Do not drive yourself to the hospital. Summary  Dehydration is a condition in which there is not enough water or other fluids in the body. This happens when a person loses more fluids than he or she takes in.  Treatment for this condition depends on how bad it is. Treatment should be started right away. Do not wait until your condition gets very bad.  Drink enough clear fluid to keep your pee pale yellow. If you were told to drink an oral rehydration solution (ORS), finish the ORS first. Then, start slowly drinking other clear fluids.    Take over-the-counter and prescription medicines only as told by your doctor.  Get help right away if you have any symptoms of very bad dehydration. This information is not intended to replace advice given to you by your health care provider. Make sure you discuss any questions you have with your health care provider. Document Revised: 09/15/2018 Document Reviewed: 09/15/2018 Elsevier Patient Education  [employer]. Metoclopramide injection What is this medicine? METOCLOPRAMIDE (met oh kloe PRA mide) is used to treat people with slow emptying of the stomach and intestinal tract. It may be used to prevent nausea and vomiting caused by cancer treatment or surgery. This medicine may also be used before certain stomach exams or procedures. This medicine may be used for other purposes; ask your health care provider or pharmacist if you have questions. COMMON BRAND NAME(S): Reglan What should I tell my health care provider before I take this medicine? They need to know if you have any of these conditions:  breast cancer  depression  diabetes  heart failure  high blood pressure  if you often drink alcohol  kidney disease  liver disease  Parkinson's disease or a movement disorder  pheochromocytoma  seizures  stomach obstruction, bleeding, or perforation  an  unusual or allergic reaction to metoclopramide, procainamide, sulfites, other medicines, foods, dyes, or preservatives  pregnant or trying to get pregnant  breast-feeding How should I use this medicine? This medicine is for injection into a muscle or for infusion into a vein. It is given by a health care professional in a hospital or clinic setting. A special MedGuide will be given to you by the pharmacist with each prescription and refill. Be sure to read this information carefully each time. Talk to your pediatrician regarding the use of this medicine in children. Special care may be needed. Overdosage: If you think you have taken too much of this medicine contact a poison control center or emergency room at once. NOTE: This medicine is only for you. Do not share this medicine with others. What if I miss a dose? This does not apply. What may interact with this medicine?  alcohol  antihistamines for allergy, cough, and cold  atovaquone  atropine  bupropion  certain medicines for anxiety or sleep  certain medicines for bladder problems like oxybutynin, tolterodine  certain medicines for depression or psychotic disorders  certain medicines for Parkinson's disease  certain medicines for seizures like phenobarbital, primidone  certain medicines for stomach problems like dicyclomine, hyoscyamine  certain medicines for travel sickness like scopolamine  cyclosporine  digoxin  fosfomycin  general anesthetics like halothane, isoflurane, methoxyflurane, propofol  insulin and other medicines for diabetes  ipratropium  MAOIs like Carbex, Eldepryl, Marplan, Nardil, and Parnate  medicines that relax muscles for surgery  narcotic medicines for pain  paroxetine  phenothiazines like chlorpromazine, mesoridazine, prochlorperazine, thioridazine  posaconazole  quinidine  sirolimus  tacrolimus This list may not describe all possible interactions. Give your health care  provider a list of all the medicines, herbs, non-prescription drugs, or dietary supplements you use. Also tell them if you smoke, drink alcohol, or use illegal drugs. Some items may interact with your medicine. What should I watch for while using this medicine? It may take a few weeks for your stomach condition to get better. However, do not take this medicine for longer than 12 weeks. The longer you take this medicine, and the more you take it, the greater your chances are of developing serious side effects. If you are  an elderly patient, a male patient, or you have diabetes, you may be at an increased risk for side effects from this medicine. Contact your doctor immediately if you start having movements you cannot control such as lip smacking, rapid movements of the tongue, involuntary or uncontrollable movements of the eyes, head, arms and legs, or muscle twitches and spasms. Patients and their families should watch out for worsening depression or thoughts of suicide. Also watch out for any sudden or severe changes in feelings such as feeling anxious, agitated, panicky, irritable, hostile, aggressive, impulsive, severely restless, overly excited and hyperactive, or not being able to sleep. If this happens, especially at the beginning of treatment or after a change in dose, call your doctor. Do not treat yourself for high fever. Ask your doctor or health care professional for advice. You may get drowsy or dizzy. Do not drive, use machinery, or do anything that needs mental alertness until you know how this drug affects you. Do not stand or sit up quickly, especially if you are an older patient. This reduces the risk of dizzy or fainting spells. Alcohol can make you more drowsy and dizzy. Avoid alcoholic drinks. What side effects may I notice from receiving this medicine? Side effects that you should report to your doctor or health care professional as soon as possible:  allergic reactions like skin  rash, itching or hives, swelling of the face, lips, or tongue  abnormal production of milk  breast enlargement in both males and females  change in sex drive or performance  depressed mood  menstrual changes  restlessness, pacing, inability to keep still  signs and symptoms of neuroleptic malignant syndrome (NMS) like confusion; fast, irregular heartbeat; high fever; increased sweating; uncontrollable head, mouth, neck, arm, or leg movements; stiff muscles  seizures  suicidal thoughts, mood changes  swelling of the ankles, feet, hands  tremor  uncontrollable movements of the face, head, mouth, neck, arms, legs, or upper body  unusually weak or tired Side effects that usually do not require medical attention (report to your doctor or health care professional if they continue or are bothersome):  dizziness  drowsiness  headache  tiredness This list may not describe all possible side effects. Call your doctor for medical advice about side effects. You may report side effects to FDA at 1-800-FDA-1088. Where should I keep my medicine? This drug is given in a hospital or clinic and will not be stored at home. NOTE: This sheet is a summary. It may not cover all possible information. If you have questions about this medicine, talk to your doctor, pharmacist, or health care provider.  2021 Elsevier/Gold Standard (2018-09-26 10:12:25) Famotidine injection What is this medicine? FAMOTIDINE (fa MOE ti deen) is a type of antihistamine that blocks the release of stomach acid. It is used to treat stomach or intestinal ulcers. It can relieve ulcer pain and discomfort, and the heartburn from acid reflux. This medicine may be used for other purposes; ask your health care provider or pharmacist if you have questions. COMMON BRAND NAME(S): Pepcid What should I tell my health care provider before I take this medicine? They need to know if you have any of these conditions:  kidney or  liver disease  an unusual or allergic reaction to famotidine, other medicines, foods, dyes, or preservatives  pregnant or trying to get pregnant  breast-feeding How should I use this medicine? This medicine is for infusion into a vein. It is given by a health care professional in  a hospital or clinic setting. Talk to your pediatrician regarding the use of this medicine in children. Special care may be needed. Overdosage: If you think you have taken too much of this medicine contact a poison control center or emergency room at once. NOTE: This medicine is only for you. Do not share this medicine with others. What if I miss a dose? This does not apply. What may interact with this medicine?  delavirdine  itraconazole  ketoconazole This list may not describe all possible interactions. Give your health care provider a list of all the medicines, herbs, non-prescription drugs, or dietary supplements you use. Also tell them if you smoke, drink alcohol, or use illegal drugs. Some items may interact with your medicine. What should I watch for while using this medicine? Tell your doctor or health care professional if your condition does not start to get better or gets worse. Do not take with aspirin, ibuprofen, or other antiinflammatory medicines. These can aggravate your condition. Do not smoke cigarettes or drink alcohol. These increase irritation in your stomach and can increase the time it will take for ulcers to heal. Cigarettes and alcohol can also worsen acid reflux or heartburn. If you get black, tarry stools or vomit up what looks like coffee grounds, call your doctor or health care professional at once. You may have a bleeding ulcer. This medicine may cause a decrease in vitamin B12. You should make sure that you get enough vitamin B12 while you are taking this medicine. Discuss the foods you eat and the vitamins you take with your health care professional. What side effects may I notice  from receiving this medicine? Side effects that you should report to your doctor or health care professional as soon as possible:  allergic reactions like skin rash, itching or hives, swelling of the face, lips, or tongue  agitation, nervousness  confusion  hallucinations Side effects that usually do not require medical attention (report to your doctor or health care professional if they continue or are bothersome):  constipation  diarrhea  dizziness  headache This list may not describe all possible side effects. Call your doctor for medical advice about side effects. You may report side effects to FDA at 1-800-FDA-1088. Where should I keep my medicine? This medicine is given in a hospital or clinic. You will not be given this medicine to store at home. NOTE: This sheet is a summary. It may not cover all possible information. If you have questions about this medicine, talk to your doctor, pharmacist, or health care provider.  2021 Elsevier/Gold Standard (2016-09-18 13:16:46) Hydromorphone injection What is this medicine? HYDROMORPHONE (hye droe MOR fone) is a pain reliever. It is used to treat moderate to severe pain. This medicine may be used for other purposes; ask your health care provider or pharmacist if you have questions. COMMON BRAND NAME(S): Dilaudid, Dilaudid-HP, Simplist Dilaudid What should I tell my health care provider before I take this medicine? They need to know if you have any of these conditions:  brain tumor  drug abuse or addiction  head injury  heart disease  if you often drink alcohol  kidney disease  liver disease  lung or breathing disease, like asthma  problems urinating  seizures  stomach or intestine problems  an unusual or allergic reaction to hydromorphone, other medicines, foods, dyes, or preservatives  pregnant or trying to get pregnant  breast-feeding How should I use this medicine? This medicine is for injection into a  vein, into a  muscle, or under the skin. It is usually given by a health care professional in a hospital or clinic setting. In rare cases, you might get this medicine at home. You will be taught how to give this medicine. Use exactly as directed. Take your medicine at regular intervals. Do not take your medicine more often than directed. It is important that you put your used needles and syringes in a special sharps container. Do not put them in a trash can. If you do not have a sharps container, call your pharmacist or healthcare provider to get one. Talk to your pediatrician regarding the use of this medicine in children. Special care may be needed. Overdosage: If you think you have taken too much of this medicine contact a poison control center or emergency room at once. NOTE: This medicine is only for you. Do not share this medicine with others. What if I miss a dose? If you miss a dose, use it as soon as you can. If it is almost time for your next dose, use only that dose. Do not use double or extra doses. What may interact with this medicine? This medicine may interact with the following medications:  alcohol  antihistamines for allergy, cough and cold  certain medicines for anxiety or sleep  certain medicines for depression like amitriptyline, fluoxetine, sertraline  certain medicines for seizures like phenobarbital, primidone  general anesthetics like halothane, isoflurane, methoxyflurane, propofol  local anesthetics like lidocaine, pramoxine, tetracaine  MAOIs like Carbex, Eldepryl, Marplan, Nardil, and Parnate  medicines that relax muscles for surgery  other narcotic medicines for pain or cough  phenothiazines like chlorpromazine, mesoridazine, prochlorperazine, thioridazine This list may not describe all possible interactions. Give your health care provider a list of all the medicines, herbs, non-prescription drugs, or dietary supplements you use. Also tell them if you  smoke, drink alcohol, or use illegal drugs. Some items may interact with your medicine. What should I watch for while using this medicine? Tell your health care provider if your pain does not go away, if it gets worse, or if you have new or a different type of pain. You may develop tolerance to this drug. Tolerance means that you will need a higher dose of the drug for pain relief. Tolerance is normal and is expected if you take this drug for a long time. There are different types of narcotic drugs (opioids) for pain. If you take more than one type at the same time, you may have more side effects. Give your health care provider a list of all drugs you use. He or she will tell you how much drug to take. Do not take more drug than directed. Get emergency help right away if you have problems breathing. Do not suddenly stop taking your drug because you may develop a severe reaction. Your body becomes used to the drug. This does NOT mean you are addicted. Addiction is a behavior related to getting and using a drug for a nonmedical reason. If you have pain, you have a medical reason to take pain drug. Your health care provider will tell you how much drug to take. If your health care provider wants you to stop the drug, the dose will be slowly lowered over time to avoid any side effects. Talk to your health care provider about naloxone and how to get it. Naloxone is an emergency drug used for an opioid overdose. An overdose can happen if you take too much opioid. It can also happen if  an opioid is taken with some other drugs or substances, like alcohol. Know the symptoms of an overdose, like trouble breathing, unusually tired or sleepy, or not being able to respond or wake up. Make sure to tell caregivers and close contacts where it is stored. Make sure they know how to use it. After naloxone is given, you must get emergency help right away. Naloxone is a temporary treatment. Repeat doses may be needed. You may get  drowsy or dizzy. Do not drive, use machinery, or do anything that needs mental alertness until you know how this drug affects you. Do not stand up or sit up quickly, especially if you are an older patient. This reduces the risk of dizzy or fainting spells. Alcohol may interfere with the effect of this drug. Avoid alcoholic drinks. This drug will cause constipation. If you do not have a bowel movement for 3 days, call your health care provider. Your mouth may get dry. Chewing sugarless gum or sucking hard candy and drinking plenty of water may help. Contact your health care provider if the problem does not go away or is severe. What side effects may I notice from receiving this medicine? Side effects that you should report to your doctor or health care professional as soon as possible:  allergic reactions like skin rash, itching or hives, swelling of the face, lips, or tongue  breathing problems  confusion  seizures  signs and symptoms of low blood pressure like dizziness; feeling faint or lightheaded, falls; unusually weak or tired  trouble passing urine or change in the amount of urine Side effects that usually do not require medical attention (report to your doctor or health care professional if they continue or are bothersome):  constipation  dry mouth  nausea, vomiting  tiredness This list may not describe all possible side effects. Call your doctor for medical advice about side effects. You may report side effects to FDA at 1-800-FDA-1088. Where should I keep my medicine? Keep out of the reach of children. This medicine can be abused. Keep your medicine in a safe place to protect it from theft. Do not share this medicine with anyone. Selling or giving away this medicine is dangerous and against the law. If you are using this medicine at home, you will be instructed on how to store this medicine. This medicine may cause accidental overdose and death if it is taken by other adults,  children, or pets. Flush any unused medicine down the toilet to reduce the chance of harm. Do not use the medicine after the expiration date. NOTE: This sheet is a summary. It may not cover all possible information. If you have questions about this medicine, talk to your doctor, pharmacist, or health care provider.  2021 Elsevier/Gold Standard (2018-09-12 11:27:33)

## 2020-04-29 NOTE — Progress Notes (Signed)
p 

## 2020-04-29 NOTE — Patient Instructions (Signed)

## 2020-04-30 ENCOUNTER — Other Ambulatory Visit: Payer: Self-pay | Admitting: Hematology & Oncology

## 2020-05-01 ENCOUNTER — Other Ambulatory Visit: Payer: Self-pay

## 2020-05-01 ENCOUNTER — Inpatient Hospital Stay: Payer: 59

## 2020-05-01 ENCOUNTER — Other Ambulatory Visit (HOSPITAL_BASED_OUTPATIENT_CLINIC_OR_DEPARTMENT_OTHER): Payer: Self-pay | Admitting: Hematology & Oncology

## 2020-05-01 ENCOUNTER — Encounter: Payer: Self-pay | Admitting: Hematology & Oncology

## 2020-05-01 ENCOUNTER — Inpatient Hospital Stay (HOSPITAL_BASED_OUTPATIENT_CLINIC_OR_DEPARTMENT_OTHER): Payer: 59 | Admitting: Hematology & Oncology

## 2020-05-01 ENCOUNTER — Other Ambulatory Visit: Payer: Self-pay | Admitting: *Deleted

## 2020-05-01 VITALS — BP 103/89 | HR 106 | Temp 98.1°F | Resp 18 | Ht 70.0 in | Wt 272.1 lb

## 2020-05-01 DIAGNOSIS — C787 Secondary malignant neoplasm of liver and intrahepatic bile duct: Secondary | ICD-10-CM | POA: Diagnosis not present

## 2020-05-01 DIAGNOSIS — E039 Hypothyroidism, unspecified: Secondary | ICD-10-CM

## 2020-05-01 DIAGNOSIS — C73 Malignant neoplasm of thyroid gland: Secondary | ICD-10-CM | POA: Diagnosis not present

## 2020-05-01 DIAGNOSIS — C779 Secondary and unspecified malignant neoplasm of lymph node, unspecified: Secondary | ICD-10-CM | POA: Diagnosis not present

## 2020-05-01 DIAGNOSIS — R110 Nausea: Secondary | ICD-10-CM | POA: Diagnosis not present

## 2020-05-01 DIAGNOSIS — D509 Iron deficiency anemia, unspecified: Secondary | ICD-10-CM | POA: Diagnosis not present

## 2020-05-01 LAB — CMP (CANCER CENTER ONLY)
ALT: 13 U/L (ref 0–44)
AST: 15 U/L (ref 15–41)
Albumin: 3.7 g/dL (ref 3.5–5.0)
Alkaline Phosphatase: 136 U/L — ABNORMAL HIGH (ref 38–126)
Anion gap: 6 (ref 5–15)
BUN: 14 mg/dL (ref 6–20)
CO2: 30 mmol/L (ref 22–32)
Calcium: 9.8 mg/dL (ref 8.9–10.3)
Chloride: 99 mmol/L (ref 98–111)
Creatinine: 0.91 mg/dL (ref 0.61–1.24)
GFR, Estimated: >60 mL/min (ref >60)
Glucose, Bld: 113 mg/dL — ABNORMAL HIGH (ref 70–99)
Potassium: 4.2 mmol/L (ref 3.5–5.1)
Sodium: 135 mmol/L (ref 135–145)
Total Bilirubin: 0.3 mg/dL (ref 0.3–1.2)
Total Protein: 6.1 g/dL — ABNORMAL LOW (ref 6.5–8.1)

## 2020-05-01 LAB — CBC WITH DIFFERENTIAL (CANCER CENTER ONLY)
Abs Immature Granulocytes: 0.01 10*3/uL (ref 0.00–0.07)
Basophils Absolute: 0 10*3/uL (ref 0.0–0.1)
Basophils Relative: 1 %
Eosinophils Absolute: 0.1 10*3/uL (ref 0.0–0.5)
Eosinophils Relative: 2 %
HCT: 27.6 % — ABNORMAL LOW (ref 39.0–52.0)
Hemoglobin: 8.8 g/dL — ABNORMAL LOW (ref 13.0–17.0)
Immature Granulocytes: 0 %
Lymphocytes Relative: 7 %
Lymphs Abs: 0.3 10*3/uL — ABNORMAL LOW (ref 0.7–4.0)
MCH: 27.9 pg (ref 26.0–34.0)
MCHC: 31.9 g/dL (ref 30.0–36.0)
MCV: 87.6 fL (ref 80.0–100.0)
Monocytes Absolute: 0.5 10*3/uL (ref 0.1–1.0)
Monocytes Relative: 14 %
Neutro Abs: 2.9 10*3/uL (ref 1.7–7.7)
Neutrophils Relative %: 76 %
Platelet Count: 296 10*3/uL (ref 150–400)
RBC: 3.15 MIL/uL — ABNORMAL LOW (ref 4.22–5.81)
RDW: 17.3 % — ABNORMAL HIGH (ref 11.5–15.5)
WBC Count: 3.7 10*3/uL — ABNORMAL LOW (ref 4.0–10.5)
nRBC: 0 % (ref 0.0–0.2)

## 2020-05-01 LAB — LACTATE DEHYDROGENASE: LDH: 163 U/L (ref 98–192)

## 2020-05-01 MED ORDER — SODIUM CHLORIDE 0.9 % IV SOLN
200.0000 mg | Freq: Once | INTRAVENOUS | Status: AC
  Administered 2020-05-01: 200 mg via INTRAVENOUS
  Filled 2020-05-01: qty 200

## 2020-05-01 MED ORDER — HEPARIN SOD (PORK) LOCK FLUSH 100 UNIT/ML IV SOLN
500.0000 [IU] | Freq: Once | INTRAVENOUS | Status: AC | PRN
Start: 2020-05-01 — End: 2020-05-01
  Administered 2020-05-01: 500 [IU]
  Filled 2020-05-01: qty 5

## 2020-05-01 MED ORDER — LORAZEPAM 0.5 MG PO TABS: 0.5000 mg | ORAL_TABLET | Freq: Four times a day (QID) | ORAL | 0 refills | Status: DC | PRN

## 2020-05-01 MED ORDER — SODIUM CHLORIDE 0.9 % IV SOLN
Freq: Once | INTRAVENOUS | Status: AC
  Filled 2020-05-01: qty 250

## 2020-05-01 MED ORDER — ALTEPLASE 2 MG IJ SOLR
2.0000 mg | Freq: Once | INTRAMUSCULAR | Status: DC | PRN
  Filled 2020-05-01: qty 2

## 2020-05-01 MED ORDER — SODIUM CHLORIDE 0.9% FLUSH
10.0000 mL | Freq: Once | INTRAVENOUS | Status: AC | PRN
  Administered 2020-05-01: 10 mL
  Filled 2020-05-01: qty 10

## 2020-05-01 NOTE — Progress Notes (Signed)
Hematology and Oncology Follow Up Visit  Brian [name] [id number] [birth year] 45 y.o. 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 is here today for follow-up.  He was in the hospital recently.  He had progressive disease.  He did get some radiation therapy.  This was to the liver met and to the periportal lymph node metastasis.  He wanted to know about intrahepatic therapy.  I told him that this was something that was not feasible since he had disease outside of his liver.  I know that we have talked to interventional radiology in the past.  His pain still is a problem.  He is on a Duragesic patch.  This is helping a little bit.  He is also on oral Dilaudid.  He wants to go out of work permanently.  I think this would not be a bad idea.  I just hate the fact that he cannot work.  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  I know that he had enjoyed working over there.  He is still on Eliquis.  He is doing well with the Eliquis.  He is not having any problems with cough or shortness of breath.  There has been a slight decrease in his appetite.  He has had a little bit of nausea.  I do not think he has had any issues with diarrhea.  As far as further systemic therapy, we really do not have a lot of options.  He had been on Adriamycin.  He has been doing well with this.   However, he then was found to have progressive disease.  I realize that he has been off Adriamycin for little bit because of the Covid.  However, I just do not feel that further Adriamycin is going to be beneficial.  Again his tumor is RET positive.  I am trying to get his insurance company to approve [place].  I think this would be a reasonable option for [place].  The dose will be 400 mg a day.  Hopefully, his insurance will approve this since our treatment options are very limited.  I have to say that at this time, his performance status is ECOG 2.    Medications:  Allergies as of 05/01/2020      Reactions   Dextromethorphan-guaifenesin Other (See Comments)   Irregular heartbeat   Doxycycline Anaphylaxis, Nausea And Vomiting, Rash, Other (See Comments)   "heart arrythmia" and "dyspepsia" (only oral doxycycline causes reaction)   Gadobutrol Hives, Other (See Comments)   Patient had MRI scan at [place]. Patient called one hour after he left imaging facility to report two "blisters" that came up on "back" of lip.    Gadolinium Derivatives Hives   Patient had MRI scan at [place]. Patient called one hour after he left imaging facility to report  two "blisters" that came up on "back" of lip.    Guaifenesin Palpitations      Ibuprofen Hives, Itching   Lisinopril Other (See Comments)   Angioedema   Pantoprazole Itching   Tramadol Hives, Itching   Barium Rash   Developed redness around neck after drinking 1st bottle of Barocat; pt was given Benedryl by ED Mds to be "on the safe side" before drinking 2nd bottle   Chlorhexidine Hives      Medication List       Accurate as of May 01, 2020  5:09 PM. If you have any questions, ask your nurse or doctor.        STOP taking these medications   LORazepam 0.5 MG tablet Commonly known as: ATIVAN     TAKE these medications   apixaban 5 MG Tabs tablet Commonly known as: ELIQUIS Take 1 tablet (5 mg total) by mouth 2 (two)  times daily.   baclofen 10 MG tablet Commonly known as: LIORESAL Take 1 tablet (10 mg total) by mouth 3 (three) times daily.   cyclobenzaprine 10 MG tablet Commonly known as: FLEXERIL Take 1 tablet (10 mg total) by mouth 3 (three) times daily as needed.   dronabinol 5 MG capsule Commonly known as: MARINOL Take 1 capsule (5 mg total) by mouth 2 (two) times daily before a meal.   EPINEPHrine 0.3 mg/0.3 mL Soaj injection Commonly known as: EpiPen 2-Pak USE AS DIRECTED FOR LIFE THREATENING ALLERGIC REACTIONS   famotidine 40 MG tablet Commonly known as: PEPCID Take 1 tablet (40 mg total) by mouth 2 (two) times daily.   fentaNYL 75 MCG/HR Commonly known as: Roma 1 patch onto the skin every 3 (three) days.   furosemide 80 MG tablet Commonly known as: LASIX TAKE 1 TABLET (80 MG TOTAL) BY MOUTH DAILY.   gabapentin 300 MG capsule Commonly known as: NEURONTIN TAKE 1 CAPSULE BY MOUTH THREE TIMES DAILY   HYDROmorphone 4 MG tablet Commonly known as: DILAUDID Take 1 tablet (4 mg total) by mouth every 4 (four) hours as needed for severe pain.   levothyroxine 88 MCG tablet Commonly known as: SYNTHROID Take 88 mcg by mouth daily before breakfast.   levothyroxine 200 MCG tablet Commonly known as: SYNTHROID Take 1 tablet (200 mcg total) by mouth daily. Take along with 88 mcg=288 mcg   methylphenidate 10 MG tablet Commonly known as: RITALIN TAKE 1 TABLET (10 MG TOTAL) BY MOUTH 2 (TWO) TIMES DAILY. What changed: when to take this   metoCLOPramide 10 MG tablet Commonly known as: REGLAN Take 1 tablet (10 mg total) by mouth every 6 (six) hours as needed for nausea or vomiting.   metolazone 5 MG tablet Commonly known as: ZAROXOLYN TAKE 1 TABLET (5 MG TOTAL) BY MOUTH DAILY. TAKE 1 HOUR BEFORE LASIX.   multivitamin capsule Take 1 capsule by mouth daily.   OLANZapine 10 MG tablet Commonly known as: ZYPREXA Take 10 mg by mouth at bedtime.   polyethylene glycol 17 g  packet Commonly known as: MIRALAX / GLYCOLAX Take 17 g by mouth daily as needed for mild constipation.   potassium chloride 10 MEQ tablet Commonly known as: KLOR-CON TAKE 1 TABLET BY MOUTH ONCE DAILY   senna-docusate 8.6-50 MG tablet Commonly known as: Senokot-S Take 1 tablet by mouth at bedtime as needed for mild constipation.       Allergies:  Allergies  Allergen Reactions  . Dextromethorphan-Guaifenesin Other (See Comments)    Irregular heartbeat   . Doxycycline  Anaphylaxis, Nausea And Vomiting, Rash and Other (See Comments)    "heart arrythmia" and "dyspepsia" (only oral doxycycline causes reaction)  . Gadobutrol Hives and Other (See Comments)    Patient had MRI scan at [place]. Patient called one hour after he left imaging facility to report two "blisters" that came up on "back" of lip.    . Gadolinium Derivatives Hives    Patient had MRI scan at [place]. Patient called one hour after he left imaging facility to report two "blisters" that came up on "back" of lip.   . Guaifenesin Palpitations       . Ibuprofen Hives and Itching  . Lisinopril Other (See Comments)    Angioedema  . Pantoprazole Itching  . Tramadol Hives and Itching  . Barium Rash    Developed redness around neck after drinking 1st bottle of Barocat; pt was given Benedryl by ED Mds to be "on the safe side" before drinking 2nd bottle  . Chlorhexidine Hives    Past Medical History, Surgical history, Social history, and Family History were reviewed and updated.  Review of Systems: Review of Systems  Constitutional: Positive for malaise/fatigue.  HENT: Negative.   Eyes: Negative.   Respiratory: Negative.   Cardiovascular: Negative.   Gastrointestinal: Positive for abdominal pain, constipation, heartburn and nausea.  Genitourinary: Negative.   Musculoskeletal: Negative.   Skin: Negative.   Neurological: Negative.   Endo/Heme/Allergies: Negative.   Psychiatric/Behavioral:  Negative.      Physical Exam:  height is _0  (1.778 m) and weight is 272 lb 1.3 oz (123.4 kg). His oral temperature is 98.1 F (36.7 C). His blood pressure is 103/89 and his pulse is 106 (abnormal). His respiration is 18 and oxygen saturation is 100%.   Wt Readings from Last 3 Encounters:  05/01/20 272 lb 1.3 oz (123.4 kg)  05/01/20 272 lb 1.3 oz (123.4 kg)  04/22/20 278 lb (126.1 kg)    Physical Exam Vitals reviewed.  HENT:     Head: Normocephalic and atraumatic.  Eyes:     Pupils: Pupils are equal, round, and reactive to light.  Cardiovascular:     Rate and Rhythm: Normal rate and regular rhythm.     Heart sounds: Normal heart sounds.  Pulmonary:     Effort: Pulmonary effort is normal.     Breath sounds: Normal breath sounds.  Abdominal:     General: Bowel sounds are normal.     Palpations: Abdomen is soft.  Musculoskeletal:        General: No tenderness or deformity. Normal range of motion.     Cervical back: Normal range of motion.  Lymphadenopathy:     Cervical: No cervical adenopathy.  Skin:    General: Skin is warm and dry.     Findings: No erythema or rash.  Neurological:     Mental Status: He is alert and oriented to person, place, and time.  Psychiatric:        Behavior: Behavior normal.        Thought Content: Thought content normal.        Judgment: Judgment normal.      Lab Results  Component Value Date   WBC 3.7 (L) 05/01/2020   HGB 8.8 (L) 05/01/2020   HCT 27.6 (L) 05/01/2020   MCV 87.6 05/01/2020   PLT 296 05/01/2020   Lab Results  Component Value Date   FERRITIN 717 (H) 04/08/2020   IRON 21 (L) 04/08/2020   TIBC 204 04/08/2020  UIBC 183 04/08/2020   IRONPCTSAT 10 (L) 04/08/2020   Lab Results  Component Value Date   RETICCTPCT 1.7 04/08/2020   RBC 3.15 (L) 05/01/2020   No results found for: KPAFRELGTCHN, LAMBDASER, KAPLAMBRATIO No results found for: IGGSERUM, IGA, IGMSERUM No results found for: Ronnald Ramp, A1GS,  A2GS, Tillman Sers, SPEI   Chemistry      Component Value Date/Time   NA 135 05/01/2020 1138   NA 141 02/06/2019 1210   K 4.2 05/01/2020 1138   CL 99 05/01/2020 1138   CO2 30 05/01/2020 1138   BUN 14 05/01/2020 1138   BUN 8 02/06/2019 1210   CREATININE 0.91 05/01/2020 1138      Component Value Date/Time   CALCIUM 9.8 05/01/2020 1138   ALKPHOS 136 (H) 05/01/2020 1138   AST 15 05/01/2020 1138   ALT 13 05/01/2020 1138   BILITOT 0.3 05/01/2020 1138       Impression and Plan: 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  Again, we really do not have a lot of options left for systemic therapy.  There is just is not a lot of information as far as therapy for metastatic Hurthle cell cancer.  Again he has the RET mutation.  I really think this is a reasonable way to treat him.  Again I hate the fact that insurance has to dictate this.  I want to make sure that he has comfort.  I want to make sure that we focus on his pain control issues.  However we can make sure that he has good pain control we need to focus on.  He does get IV fluids on occasion.  This does make him feel better.  I am not going to change the Eliquis for right now.  I would like to get him back to see [place] in another couple weeks.  I am not sure how long it will take for [place] to get the [place] approved, if insurance will approve it.  I will see about calling his oncologist at [facility] to see if she has any other thoughts as to systemic therapy for him.  I know he is doing his best.  He has done everything we have asked him to do.  He has a strong faith.  We have prayer quite often.  He just wants to be able to be with his family and be an active part in his family.     [name], MD 3/16/20225:09 PM

## 2020-05-01 NOTE — Patient Instructions (Signed)
Dehydration, Adult Dehydration is a condition in which there is not enough water or other fluids in the body. This happens when a person loses more fluids than he or she takes in. Important organs, such as the kidneys, brain, and heart, cannot function without a proper amount of fluids. Any loss of fluids from the body can lead to dehydration. Dehydration can be mild, moderate, or severe. It should be treated right away to prevent it from becoming severe. What are the causes? Dehydration may be caused by:  Conditions that cause loss of water or other fluids, such as diarrhea, vomiting, or sweating or urinating a lot.  Not drinking enough fluids, especially when you are ill or doing activities that require a lot of energy.  Other illnesses and conditions, such as fever or infection.  Certain medicines, such as medicines that remove excess fluid from the body (diuretics).  Lack of safe drinking water.  Not being able to get enough water and food. What increases the risk? The following factors may make you more likely to develop this condition:  Having a long-term (chronic) illness that has not been treated properly, such as diabetes, heart disease, or kidney disease.  Being 65 years of age or older.  Having a disability.  Living in a place that is high in altitude, where thinner, drier air causes more fluid loss.  Doing exercises that put stress on your body for a long time (endurance sports). What are the signs or symptoms? Symptoms of dehydration depend on how severe it is. Mild or moderate dehydration  Thirst.  Dry lips or dry mouth.  Dizziness or light-headedness, especially when standing up from a seated position.  Muscle cramps.  Dark urine. Urine may be the color of tea.  Less urine or tears produced than usual.  Headache. Severe dehydration  Changes in skin. Your skin may be cold and clammy, blotchy, or pale. Your skin also may not return to normal after being  lightly pinched and released.  Little or no tears, urine, or sweat.  Changes in vital signs, such as rapid breathing and low blood pressure. Your pulse may be weak or may be faster than 100 beats a minute when you are sitting still.  Other changes, such as: ? Feeling very thirsty. ? Sunken eyes. ? Cold hands and feet. ? Confusion. ? Being very tired (lethargic) or having trouble waking from sleep. ? Short-term weight loss. ? Loss of consciousness. How is this diagnosed? This condition is diagnosed based on your symptoms and a physical exam. You may have blood and urine tests to help confirm the diagnosis. How is this treated? Treatment for this condition depends on how severe it is. Treatment should be started right away. Do not wait until dehydration becomes severe. Severe dehydration is an emergency and needs to be treated in a hospital.  Mild or moderate dehydration can be treated at home. You may be asked to: ? Drink more fluids. ? Drink an oral rehydration solution (ORS). This drink helps restore proper amounts of fluids and salts and minerals in the blood (electrolytes).  Severe dehydration can be treated: ? With IV fluids. ? By correcting abnormal levels of electrolytes. This is often done by giving electrolytes through a tube that is passed through your nose and into your stomach (nasogastric tube, or NG tube). ? By treating the underlying cause of dehydration. Follow these instructions at home: Oral rehydration solution If told by your health care provider, drink an ORS:  Make   an ORS by following instructions on the package.  Start by drinking small amounts, about  cup (120 mL) every 5-10 minutes.  Slowly increase how much you drink until you have taken the amount recommended by your health care provider. Eating and drinking  Drink enough clear fluid to keep your urine pale yellow. If you were told to drink an ORS, finish the ORS first and then start slowly drinking  other clear fluids. Drink fluids such as: ? Water. Do not drink only water. Doing that can lead to hyponatremia, which is having too little salt (sodium) in the body. ? Water from ice chips you suck on. ? Fruit juice that you have added water to (diluted fruit juice). ? Low-calorie sports drinks.  Eat foods that contain a healthy balance of electrolytes, such as bananas, oranges, potatoes, tomatoes, and spinach.  Do not drink alcohol.  Avoid the following: ? Drinks that contain a lot of sugar. These include high-calorie sports drinks, fruit juice that is not diluted, and soda. ? Caffeine. ? Foods that are greasy or contain a lot of fat or sugar.         General instructions  Take over-the-counter and prescription medicines only as told by your health care provider.  Do not take sodium tablets. Doing that can lead to having too much sodium in the body (hypernatremia).  Return to your normal activities as told by your health care provider. Ask your health care provider what activities are safe for you.  Keep all follow-up visits as told by your health care provider. This is important. Contact a health care provider if:  You have muscle cramps, pain, or discomfort, such as: ? Pain in your abdomen and the pain gets worse or stays in one area (localizes). ? Stiff neck.  You have a rash.  You are more irritable than usual.  You are sleepier or have a harder time waking than usual.  You feel weak or dizzy.  You feel very thirsty. Get help right away if you have:  Any symptoms of severe dehydration.  Symptoms of vomiting, such as: ? You cannot eat or drink without vomiting. ? Vomiting gets worse or does not go away. ? Vomit includes blood or green matter (bile).  Symptoms that get worse with treatment.  A fever.  A severe headache.  Problems with urination or bowel movements, such as: ? Diarrhea that gets worse or does not go away. ? Blood in your stool (feces).  This may cause stool to look black and tarry. ? Not urinating, or urinating only a small amount of very dark urine, within 6-8 hours.  Trouble breathing. These symptoms may represent a serious problem that is an emergency. Do not wait to see if the symptoms will go away. Get medical help right away. Call your local emergency services (911 in the [place] Do not drive yourself to the hospital. Summary  Dehydration is a condition in which there is not enough water or other fluids in the body. This happens when a person loses more fluids than he or she takes in.  Treatment for this condition depends on how severe it is. Treatment should be started right away. Do not wait until dehydration becomes severe.  Drink enough clear fluid to keep your urine pale yellow. If you were told to drink an oral rehydration solution (ORS), finish the ORS first and then start slowly drinking other clear fluids.  Take over-the-counter and prescription medicines only as told by your health   care provider.  Get help right away if you have any symptoms of severe dehydration. This information is not intended to replace advice given to you by your health care provider. Make sure you discuss any questions you have with your health care provider. Document Revised: 09/15/2018 Document Reviewed: 09/15/2018 Elsevier Patient Education  2021 [employer] Inc[employer]  

## 2020-05-02 ENCOUNTER — Telehealth: Payer: Self-pay

## 2020-05-02 LAB — TSH: TSH: 0.486 u[IU]/mL (ref 0.320–4.118)

## 2020-05-02 NOTE — Telephone Encounter (Signed)
Patient called inquiring about next appt, informed patient per Dr.Ennevers note he can come in for hydration if needed and he will follow up with regular appt. No LOS entered, will find out when MD wants to see patient and call him back to schedule. Patient aware and denies any other questions or concerns at this time.

## 2020-05-02 NOTE — Telephone Encounter (Signed)
No 05/01/20 LOS    Brian [name]

## 2020-05-03 ENCOUNTER — Other Ambulatory Visit: Payer: Self-pay | Admitting: Family

## 2020-05-06 ENCOUNTER — Telehealth: Payer: Self-pay | Admitting: *Deleted

## 2020-05-06 ENCOUNTER — Other Ambulatory Visit: Payer: Self-pay | Admitting: *Deleted

## 2020-05-06 ENCOUNTER — Other Ambulatory Visit (HOSPITAL_BASED_OUTPATIENT_CLINIC_OR_DEPARTMENT_OTHER): Payer: Self-pay | Admitting: Hematology & Oncology

## 2020-05-06 DIAGNOSIS — C73 Malignant neoplasm of thyroid gland: Secondary | ICD-10-CM

## 2020-05-06 DIAGNOSIS — D509 Iron deficiency anemia, unspecified: Secondary | ICD-10-CM

## 2020-05-06 DIAGNOSIS — C342 Malignant neoplasm of middle lobe, bronchus or lung: Secondary | ICD-10-CM

## 2020-05-06 DIAGNOSIS — C799 Secondary malignant neoplasm of unspecified site: Secondary | ICD-10-CM

## 2020-05-06 DIAGNOSIS — E860 Dehydration: Secondary | ICD-10-CM

## 2020-05-06 MED ORDER — DIPHENHYDRAMINE HCL 50 MG PO TABS: ORAL_TABLET | ORAL | 0 refills | Status: DC

## 2020-05-06 MED ORDER — PREDNISONE 50 MG PO TABS: ORAL_TABLET | ORAL | 3 refills | Status: DC

## 2020-05-06 NOTE — Telephone Encounter (Signed)
Call received from patient wanting to know if he can get a blood transfusion d/t he is fatigued, SOB with exertion and has a HGB-8.8.  Dr. Marin Olp notified.  Call placed back to patient and patient notified that Dr. Marin Olp would like for him to have one unit of PRBC's.  Pt states that he will be here tomorrow at 0900 for type and crossmatch and would like to stay to get blood tomorrow.  Message sent to scheduling.

## 2020-05-07 ENCOUNTER — Other Ambulatory Visit: Payer: Self-pay | Admitting: *Deleted

## 2020-05-07 ENCOUNTER — Other Ambulatory Visit: Payer: Self-pay

## 2020-05-07 ENCOUNTER — Inpatient Hospital Stay: Payer: 59

## 2020-05-07 DIAGNOSIS — C342 Malignant neoplasm of middle lobe, bronchus or lung: Secondary | ICD-10-CM

## 2020-05-07 DIAGNOSIS — C799 Secondary malignant neoplasm of unspecified site: Secondary | ICD-10-CM

## 2020-05-07 DIAGNOSIS — C787 Secondary malignant neoplasm of liver and intrahepatic bile duct: Secondary | ICD-10-CM | POA: Diagnosis not present

## 2020-05-07 DIAGNOSIS — D509 Iron deficiency anemia, unspecified: Secondary | ICD-10-CM

## 2020-05-07 DIAGNOSIS — C779 Secondary and unspecified malignant neoplasm of lymph node, unspecified: Secondary | ICD-10-CM | POA: Diagnosis not present

## 2020-05-07 DIAGNOSIS — C73 Malignant neoplasm of thyroid gland: Secondary | ICD-10-CM

## 2020-05-07 DIAGNOSIS — R110 Nausea: Secondary | ICD-10-CM | POA: Diagnosis not present

## 2020-05-07 DIAGNOSIS — E860 Dehydration: Secondary | ICD-10-CM

## 2020-05-07 LAB — PREPARE RBC (CROSSMATCH)

## 2020-05-07 MED ORDER — GAVRETO 100 MG PO CAPS: 400.0000 mg | ORAL_CAPSULE | Freq: Every day | ORAL | 5 refills | Status: DC

## 2020-05-07 MED ORDER — SODIUM CHLORIDE 0.9 % IV SOLN
Freq: Once | INTRAVENOUS | Status: DC | PRN
  Filled 2020-05-07: qty 250

## 2020-05-07 MED ORDER — HEPARIN SOD (PORK) LOCK FLUSH 100 UNIT/ML IV SOLN
500.0000 [IU] | Freq: Every day | INTRAVENOUS | Status: AC | PRN
  Administered 2020-05-07: 500 [IU]
  Filled 2020-05-07: qty 5

## 2020-05-07 MED ORDER — SODIUM CHLORIDE 0.9% IV SOLUTION
250.0000 mL | Freq: Once | INTRAVENOUS | Status: AC
  Administered 2020-05-07: 250 mL via INTRAVENOUS
  Filled 2020-05-07: qty 250

## 2020-05-07 MED ORDER — SODIUM CHLORIDE 0.9% FLUSH
10.0000 mL | INTRAVENOUS | Status: AC | PRN
  Administered 2020-05-07: 10 mL
  Filled 2020-05-07: qty 10

## 2020-05-07 NOTE — Patient Instructions (Signed)
https://www.[facility].org/donate-blood/blood-donation-process/what-happens-to-donated-blood/blood-transfusions/types-of-[email or web address].html"> https://www.hematology.org/education/patients/blood-basics/blood-safety-and-matching"> https://www.nhlbi.nih.gov/health-topics/blood-transfusion">  Blood Transfusion, Adult A blood transfusion is a procedure in which you receive blood or a type of blood cell (blood component) through an IV. You may need a blood transfusion when your blood level is low. This may result from a bleeding disorder, illness, injury, or surgery. The blood may come from a donor. You may also be able to donate blood for yourself (autologous blood donation) before a planned surgery. The blood given in a transfusion is made up of different blood components. You may receive:  Red blood cells. These carry oxygen to the cells in the body.  Platelets. These help your blood to clot.  Plasma. This is the liquid part of your blood. It carries proteins and other substances throughout the body.  White blood cells. These help you fight infections. If you have hemophilia or another clotting disorder, you may also receive other types of blood products. Tell a health care provider about:  Any blood disorders you have.  Any previous reactions you have had during a blood transfusion.  Any allergies you have.  All medicines you are taking, including vitamins, herbs, eye drops, creams, and over-the-counter medicines.  Any surgeries you have had.  Any medical conditions you have, including any recent fever or cold symptoms.  Whether you are pregnant or may be pregnant. What are the risks? Generally, this is a safe procedure. However, problems may occur.  The most common problems include: ? A mild allergic reaction, such as red, swollen areas of skin (hives) and itching. ? Fever or chills. This may be the body's response to new blood cells received. This may occur during or up to 4  hours after the transfusion.  More serious problems may include: ? Transfusion-associated circulatory overload (TACO), or too much fluid in the lungs. This may cause breathing problems. ? A serious allergic reaction, such as difficulty breathing or swelling around the face and lips. ? Transfusion-related acute lung injury (TRALI), which causes breathing difficulty and low oxygen in the blood. This can occur within hours of the transfusion or several days later. ? Iron overload. This can happen after receiving many blood transfusions over a period of time. ? Infection or virus being transmitted. This is rare because donated blood is carefully tested before it is given. ? Hemolytic transfusion reaction. This is rare. It happens when your body's defense system (immune system)tries to attack the new blood cells. Symptoms may include fever, chills, nausea, low blood pressure, and low back or chest pain. ? Transfusion-associated graft-versus-host disease (TAGVHD). This is rare. It happens when donated cells attack your body's healthy tissues. What happens before the procedure? Medicines Ask your health care provider about:  Changing or stopping your regular medicines. This is especially important if you are taking diabetes medicines or blood thinners.  Taking medicines such as aspirin and ibuprofen. These medicines can thin your blood. Do not take these medicines unless your health care provider tells you to take them.  Taking over-the-counter medicines, vitamins, herbs, and supplements. General instructions  Follow instructions from your health care provider about eating and drinking restrictions.  You will have a blood test to determine your blood type. This is necessary to know what kind of blood your body will accept and to match it to the donor blood.  If you are going to have a planned surgery, you may be able to do an autologous blood donation. This may be done in case you need to have a  transfusion.  You will have your temperature, blood pressure, and pulse monitored before the transfusion.  If you have had an allergic reaction to a transfusion in the past, you may be given medicine to help prevent a reaction. This medicine may be given to you by mouth (orally) or through an IV.  Set aside time for the blood transfusion. This procedure generally takes 1-4 hours to complete. What happens during the procedure?  An IV will be inserted into one of your veins.  The bag of donated blood will be attached to your IV. The blood will then enter through your vein.  Your temperature, blood pressure, and pulse will be monitored regularly during the transfusion. This monitoring is done to detect early signs of a transfusion reaction.  Tell your nurse right away if you have any of these symptoms during the transfusion: ? Shortness of breath or trouble breathing. ? Chest or back pain. ? Fever or chills. ? Hives or itching.  If you have any signs or symptoms of a reaction, your transfusion will be stopped and you may be given medicine.  When the transfusion is complete, your IV will be removed.  Pressure may be applied to the IV site for a few minutes.  A bandage (dressing)will be applied. The procedure may vary among health care providers and hospitals.   What happens after the procedure?  Your temperature, blood pressure, pulse, breathing rate, and blood oxygen level will be monitored until you leave the hospital or clinic.  Your blood may be tested to see how you are responding to the transfusion.  You may be warmed with fluids or blankets to maintain a normal body temperature.  If you receive your blood transfusion in an outpatient setting, you will be told whom to contact to report any reactions. Where to find more information For more information on blood transfusions, visit the [employer]: redcross.org Summary  A blood transfusion is a procedure in which you  receive blood or a type of blood cell (blood component) through an IV.  The blood you receive may come from a donor or be donated by yourself (autologous blood donation) before a planned surgery.  The blood given in a transfusion is made up of different blood components. You may receive red blood cells, platelets, plasma, or white blood cells depending on the condition treated.  Your temperature, blood pressure, and pulse will be monitored before, during, and after the transfusion.  After the transfusion, your blood may be tested to see how your body has responded. This information is not intended to replace advice given to you by your health care provider. Make sure you discuss any questions you have with your health care provider. Document Revised: 12/08/2018 Document Reviewed: 07/28/2018 Elsevier Patient Education  2021 [employer].   Dehydration, Adult Dehydration is condition in which there is not enough water or other fluids in the body. This happens when a person loses more fluids than he or she takes in. Important body parts cannot work right without the right amount of fluids. Any loss of fluids from the body can cause dehydration. Dehydration can be mild, worse, or very bad. It should be treated right away to keep it from getting very bad. What are the causes? This condition may be caused by:  Conditions that cause loss of water or other fluids, such as: ? Watery poop (diarrhea). ? Vomiting. ? Sweating a lot. ? Peeing (urinating) a lot.  Not drinking enough fluids, especially when you: ?  Are ill. ? Are doing things that take a lot of energy to do.  Other illnesses and conditions, such as fever or infection.  Certain medicines, such as medicines that take extra fluid out of the body (diuretics).  Lack of safe drinking water.  Not being able to get enough water and food. What increases the risk? The following factors may make you more likely to develop this  condition:  Having a long-term (chronic) illness that has not been treated the right way, such as: ? Diabetes. ? Heart disease. ? Kidney disease.  Being 78 years of age or older.  Having a disability.  Living in a place that is high above the ground or sea (high in altitude). The thinner, dried air causes more fluid loss.  Doing exercises that put stress on your body for a long time. What are the signs or symptoms? Symptoms of dehydration depend on how bad it is. Mild or worse dehydration  Thirst.  Dry lips or dry mouth.  Feeling dizzy or light-headed, especially when you stand up from sitting.  Muscle cramps.  Your body making: ? Dark pee (urine). Pee may be the color of tea. ? Less pee than normal. ? Less tears than normal.  Headache. Very bad dehydration  Changes in skin. Skin may: ? Be cold to the touch (clammy). ? Be blotchy or pale. ? Not go back to normal right after you lightly pinch it and let it go.  Little or no tears, pee, or sweat.  Changes in vital signs, such as: ? Fast breathing. ? Low blood pressure. ? Weak pulse. ? Pulse that is more than 100 beats a minute when you are sitting still.  Other changes, such as: ? Feeling very thirsty. ? Eyes that look hollow (sunken). ? Cold hands and feet. ? Being mixed up (confused). ? Being very tired (lethargic) or having trouble waking from sleep. ? Short-term weight loss. ? Loss of consciousness. How is this treated? Treatment for this condition depends on how bad it is. Treatment should start right away. Do not wait until your condition gets very bad. Very bad dehydration is an emergency. You will need to go to a hospital.  Mild or worse dehydration can be treated at home. You may be asked to: ? Drink more fluids. ? Drink an oral rehydration solution (ORS). This drink helps get the right amounts of fluids and salts and minerals in the blood (electrolytes).  Very bad dehydration can be  treated: ? With fluids through an IV tube. ? By getting normal levels of salts and minerals in your blood. This is often done by giving salts and minerals through a tube. The tube is passed through your nose and into your stomach. ? By treating the root cause. Follow these instructions at home: Oral rehydration solution If told by your doctor, drink an ORS:  Make an ORS. Use instructions on the package.  Start by drinking small amounts, about  cup (120 mL) every 5-10 minutes.  Slowly drink more until you have had the amount that your doctor said to have. Eating and drinking  Drink enough clear fluid to keep your pee pale yellow. If you were told to drink an ORS, finish the ORS first. Then, start slowly drinking other clear fluids. Drink fluids such as: ? Water. Do not drink only water. Doing that can make the salt (sodium) level in your body get too low. ? Water from ice chips you suck on. ? Fruit juice  that you have added water to (diluted). ? Low-calorie sports drinks.  Eat foods that have the right amounts of salts and minerals, such as: ? Bananas. ? Oranges. ? Potatoes. ? Tomatoes. ? Spinach.  Do not drink alcohol.  Avoid: ? Drinks that have a lot of sugar. These include:  High-calorie sports drinks.  Fruit juice that you did not add water to.  Soda.  Caffeine. ? Foods that are greasy or have a lot of fat or sugar.         General instructions  Take over-the-counter and prescription medicines only as told by your doctor.  Do not take salt tablets. Doing that can make the salt level in your body get too high.  Return to your normal activities as told by your doctor. Ask your doctor what activities are safe for you.  Keep all follow-up visits as told by your doctor. This is important. Contact a doctor if:  You have pain in your belly (abdomen) and the pain: ? Gets worse. ? Stays in one place.  You have a rash.  You have a stiff neck.  You get angry  or annoyed (irritable) more easily than normal.  You are more tired or have a harder time waking than normal.  You feel: ? Weak or dizzy. ? Very thirsty. Get help right away if you have:  Any symptoms of very bad dehydration.  Symptoms of vomiting, such as: ? You cannot eat or drink without vomiting. ? Your vomiting gets worse or does not go away. ? Your vomit has blood or green stuff in it.  Symptoms that get worse with treatment.  A fever.  A very bad headache.  Problems with peeing or pooping (having a bowel movement), such as: ? Watery poop that gets worse or does not go away. ? Blood in your poop (stool). This may cause poop to look black and tarry. ? Not peeing in 6-8 hours. ? Peeing only a small amount of very dark pee in 6-8 hours.  Trouble breathing. These symptoms may be an emergency. Do not wait to see if the symptoms will go away. Get medical help right away. Call your local emergency services (911 in the [place] Do not drive yourself to the hospital. Summary  Dehydration is a condition in which there is not enough water or other fluids in the body. This happens when a person loses more fluids than he or she takes in.  Treatment for this condition depends on how bad it is. Treatment should be started right away. Do not wait until your condition gets very bad.  Drink enough clear fluid to keep your pee pale yellow. If you were told to drink an oral rehydration solution (ORS), finish the ORS first. Then, start slowly drinking other clear fluids.  Take over-the-counter and prescription medicines only as told by your doctor.  Get help right away if you have any symptoms of very bad dehydration. This information is not intended to replace advice given to you by your health care provider. Make sure you discuss any questions you have with your health care provider. Document Revised: 09/15/2018 Document Reviewed: 09/15/2018 Elsevier Patient Education  [place].

## 2020-05-08 LAB — BPAM RBC
Blood Product Expiration Date: 202204222359
ISSUE DATE / TIME: 202203221136
Unit Type and Rh: 9500

## 2020-05-08 LAB — TYPE AND SCREEN
ABO/RH(D): O NEG
Antibody Screen: NEGATIVE
Unit division: 0

## 2020-05-09 ENCOUNTER — Other Ambulatory Visit: Payer: Self-pay

## 2020-05-09 ENCOUNTER — Ambulatory Visit (HOSPITAL_COMMUNITY)
Admission: RE | Admit: 2020-05-09 | Discharge: 2020-05-09 | Disposition: A | Discharge status: Home / Self Care | Payer: 59 | Source: Ambulatory Visit | Attending: Family | Admitting: Family

## 2020-05-09 ENCOUNTER — Other Ambulatory Visit: Payer: Self-pay | Admitting: Hematology & Oncology

## 2020-05-09 DIAGNOSIS — I700 Atherosclerosis of aorta: Secondary | ICD-10-CM | POA: Diagnosis not present

## 2020-05-09 DIAGNOSIS — C73 Malignant neoplasm of thyroid gland: Secondary | ICD-10-CM | POA: Insufficient documentation

## 2020-05-09 DIAGNOSIS — Z9049 Acquired absence of other specified parts of digestive tract: Secondary | ICD-10-CM | POA: Diagnosis not present

## 2020-05-09 DIAGNOSIS — E890 Postprocedural hypothyroidism: Secondary | ICD-10-CM | POA: Diagnosis not present

## 2020-05-09 DIAGNOSIS — C787 Secondary malignant neoplasm of liver and intrahepatic bile duct: Secondary | ICD-10-CM | POA: Diagnosis not present

## 2020-05-09 DIAGNOSIS — I2510 Atherosclerotic heart disease of native coronary artery without angina pectoris: Secondary | ICD-10-CM | POA: Diagnosis not present

## 2020-05-09 MED ORDER — GADOBUTROL 1 MMOL/ML IV SOLN
10.0000 mL | Freq: Once | INTRAVENOUS | Status: AC | PRN
  Administered 2020-05-09: 10 mL via INTRAVENOUS

## 2020-05-09 MED ORDER — IOHEXOL 300 MG/ML  SOLN
75.0000 mL | Freq: Once | INTRAMUSCULAR | Status: AC | PRN
  Administered 2020-05-09: 75 mL via INTRAVENOUS

## 2020-05-13 DIAGNOSIS — C7989 Secondary malignant neoplasm of other specified sites: Secondary | ICD-10-CM | POA: Diagnosis not present

## 2020-05-13 DIAGNOSIS — C770 Secondary and unspecified malignant neoplasm of lymph nodes of head, face and neck: Secondary | ICD-10-CM | POA: Diagnosis not present

## 2020-05-13 DIAGNOSIS — C73 Malignant neoplasm of thyroid gland: Secondary | ICD-10-CM | POA: Diagnosis not present

## 2020-05-14 ENCOUNTER — Other Ambulatory Visit (HOSPITAL_COMMUNITY): Payer: Self-pay

## 2020-05-14 DIAGNOSIS — E079 Disorder of thyroid, unspecified: Secondary | ICD-10-CM | POA: Diagnosis not present

## 2020-05-16 ENCOUNTER — Telehealth: Payer: Self-pay

## 2020-05-16 ENCOUNTER — Other Ambulatory Visit (HOSPITAL_BASED_OUTPATIENT_CLINIC_OR_DEPARTMENT_OTHER): Payer: Self-pay

## 2020-05-16 NOTE — Telephone Encounter (Signed)
Returned pts call to r/s his appts    Brian [name]

## 2020-05-17 ENCOUNTER — Inpatient Hospital Stay: Payer: 59 | Admitting: Hematology & Oncology

## 2020-05-17 ENCOUNTER — Inpatient Hospital Stay: Payer: 59

## 2020-05-17 ENCOUNTER — Other Ambulatory Visit (HOSPITAL_BASED_OUTPATIENT_CLINIC_OR_DEPARTMENT_OTHER): Payer: Self-pay | Admitting: Hematology & Oncology

## 2020-05-17 ENCOUNTER — Other Ambulatory Visit: Payer: Self-pay | Admitting: Hematology & Oncology

## 2020-05-17 MED ORDER — OXYCODONE HCL ER 20 MG PO T12A: 20.0000 mg | EXTENDED_RELEASE_TABLET | Freq: Two times a day (BID) | ORAL | 0 refills | Status: DC

## 2020-05-20 ENCOUNTER — Inpatient Hospital Stay: Payer: 59

## 2020-05-20 ENCOUNTER — Other Ambulatory Visit: Payer: Self-pay

## 2020-05-20 ENCOUNTER — Other Ambulatory Visit: Payer: Self-pay | Admitting: *Deleted

## 2020-05-20 ENCOUNTER — Inpatient Hospital Stay: Payer: 59 | Attending: Hematology & Oncology

## 2020-05-20 ENCOUNTER — Other Ambulatory Visit (HOSPITAL_BASED_OUTPATIENT_CLINIC_OR_DEPARTMENT_OTHER): Payer: Self-pay

## 2020-05-20 ENCOUNTER — Inpatient Hospital Stay (HOSPITAL_BASED_OUTPATIENT_CLINIC_OR_DEPARTMENT_OTHER): Payer: 59 | Admitting: Family

## 2020-05-20 ENCOUNTER — Encounter: Payer: Self-pay | Admitting: Family

## 2020-05-20 VITALS — BP 119/86 | HR 86 | Temp 98.8°F | Resp 20 | Ht 70.0 in | Wt 272.0 lb

## 2020-05-20 DIAGNOSIS — D509 Iron deficiency anemia, unspecified: Secondary | ICD-10-CM | POA: Diagnosis not present

## 2020-05-20 DIAGNOSIS — R5383 Other fatigue: Secondary | ICD-10-CM | POA: Insufficient documentation

## 2020-05-20 DIAGNOSIS — C799 Secondary malignant neoplasm of unspecified site: Secondary | ICD-10-CM | POA: Diagnosis not present

## 2020-05-20 DIAGNOSIS — C73 Malignant neoplasm of thyroid gland: Secondary | ICD-10-CM | POA: Diagnosis not present

## 2020-05-20 DIAGNOSIS — R110 Nausea: Secondary | ICD-10-CM | POA: Diagnosis not present

## 2020-05-20 DIAGNOSIS — C787 Secondary malignant neoplasm of liver and intrahepatic bile duct: Secondary | ICD-10-CM | POA: Insufficient documentation

## 2020-05-20 DIAGNOSIS — E860 Dehydration: Secondary | ICD-10-CM

## 2020-05-20 LAB — CBC WITH DIFFERENTIAL (CANCER CENTER ONLY)
Abs Immature Granulocytes: 0.07 10*3/uL (ref 0.00–0.07)
Basophils Absolute: 0 10*3/uL (ref 0.0–0.1)
Basophils Relative: 1 %
Eosinophils Absolute: 0 10*3/uL (ref 0.0–0.5)
Eosinophils Relative: 0 %
HCT: 39 % (ref 39.0–52.0)
Hemoglobin: 12.7 g/dL — ABNORMAL LOW (ref 13.0–17.0)
Immature Granulocytes: 1 %
Lymphocytes Relative: 20 %
Lymphs Abs: 1.1 10*3/uL (ref 0.7–4.0)
MCH: 28 pg (ref 26.0–34.0)
MCHC: 32.6 g/dL (ref 30.0–36.0)
MCV: 86.1 fL (ref 80.0–100.0)
Monocytes Absolute: 0.5 10*3/uL (ref 0.1–1.0)
Monocytes Relative: 9 %
Neutro Abs: 3.7 10*3/uL (ref 1.7–7.7)
Neutrophils Relative %: 69 %
Platelet Count: 249 10*3/uL (ref 150–400)
RBC: 4.53 MIL/uL (ref 4.22–5.81)
RDW: 16 % — ABNORMAL HIGH (ref 11.5–15.5)
WBC Count: 5.4 10*3/uL (ref 4.0–10.5)
nRBC: 0 % (ref 0.0–0.2)

## 2020-05-20 LAB — CMP (CANCER CENTER ONLY)
ALT: 33 U/L (ref 0–44)
AST: 36 U/L (ref 15–41)
Albumin: 4.1 g/dL (ref 3.5–5.0)
Alkaline Phosphatase: 117 U/L (ref 38–126)
Anion gap: 7 (ref 5–15)
BUN: 18 mg/dL (ref 6–20)
CO2: 30 mmol/L (ref 22–32)
Calcium: 9.7 mg/dL (ref 8.9–10.3)
Chloride: 99 mmol/L (ref 98–111)
Creatinine: 1.15 mg/dL (ref 0.61–1.24)
GFR, Estimated: >60 mL/min (ref >60)
Glucose, Bld: 123 mg/dL — ABNORMAL HIGH (ref 70–99)
Potassium: 4.3 mmol/L (ref 3.5–5.1)
Sodium: 136 mmol/L (ref 135–145)
Total Bilirubin: 0.3 mg/dL (ref 0.3–1.2)
Total Protein: 6.6 g/dL (ref 6.5–8.1)

## 2020-05-20 LAB — IRON AND TIBC
Iron: 128 ug/dL (ref 42–163)
Saturation Ratios: 53 % (ref 20–55)
TIBC: 240 ug/dL (ref 202–409)
UIBC: 112 ug/dL — ABNORMAL LOW (ref 117–376)

## 2020-05-20 LAB — RETICULOCYTES
Immature Retic Fract: 22.9 % — ABNORMAL HIGH (ref 2.3–15.9)
RBC.: 4.42 MIL/uL (ref 4.22–5.81)
Retic Count, Absolute: 97.2 10*3/uL (ref 19.0–186.0)
Retic Ct Pct: 2.2 % (ref 0.4–3.1)

## 2020-05-20 LAB — SAMPLE TO BLOOD BANK

## 2020-05-20 LAB — FERRITIN: Ferritin: 2533 ng/mL — ABNORMAL HIGH (ref 24–336)

## 2020-05-20 LAB — TSH: TSH: 1.122 u[IU]/mL (ref 0.320–4.118)

## 2020-05-20 LAB — LACTATE DEHYDROGENASE: LDH: 257 U/L — ABNORMAL HIGH (ref 98–192)

## 2020-05-20 MED ORDER — SODIUM CHLORIDE 0.9 % IV SOLN
1000.0000 mL | INTRAVENOUS | Status: AC
  Administered 2020-05-20: 1000 mL via INTRAVENOUS
  Filled 2020-05-20 (×2): qty 1000

## 2020-05-20 MED ORDER — HYDROMORPHONE HCL 4 MG PO TABS
4.0000 mg | ORAL_TABLET | ORAL | 0 refills | Status: DC | PRN
  Filled 2020-05-20: qty 30, 5d supply, fill #0

## 2020-05-20 MED ORDER — FUSION PLUS PO CAPS
1.0000 | ORAL_CAPSULE | Freq: Every day | ORAL | 6 refills | Status: DC
  Filled 2020-05-20: qty 30, 30d supply, fill #0
  Filled 2020-06-17: qty 30, 30d supply, fill #1
  Filled 2020-11-28: qty 30, 30d supply, fill #2
  Filled 2020-12-16: qty 30, 30d supply, fill #3
  Filled 2021-02-18: qty 30, 30d supply, fill #4

## 2020-05-20 MED ORDER — HEPARIN SOD (PORK) LOCK FLUSH 100 UNIT/ML IV SOLN
500.0000 [IU] | Freq: Once | INTRAVENOUS | Status: AC
  Administered 2020-05-20: 500 [IU] via INTRAVENOUS
  Filled 2020-05-20: qty 5

## 2020-05-20 MED ORDER — SODIUM CHLORIDE 0.9% FLUSH
10.0000 mL | Freq: Once | INTRAVENOUS | Status: AC
  Administered 2020-05-20: 10 mL via INTRAVENOUS
  Filled 2020-05-20: qty 10

## 2020-05-20 NOTE — Progress Notes (Signed)
Hematology and Oncology Follow Up Visit  Brian [name] [id number] November 18, 1975 45 y.o. 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 He was able to start [facility] on Friday 05/17/2020. He is doing well so far taking as prescribed.  LFT's are within normal limits. He states that his BP has been stable.  He has had dry skin and itching on the right side of his back the last several days. No rash or redness noted. This comes and goes. He is doing his best to stay hydrated and is also using Cerave cream on his back.  No fever, chills, n/v, cough, dizziness, SOB, chest pain, palpitations or changes in bowel or bladder habits.  He has an appointment with oncologic endocrinologist Dr. Ofilia Neas at [facility] to begin further work up for treatment options 4/28-30.  He will stop downstairs on his way out for a disc containing his scans and we will also get him print outs of the reports to take with him.  He notes fatigue and brain fog but has been able to get outside and spend some time with his son.  His pain has been well controlled on his current pain medication regimen (Oxycontin for long acting and dilaudid for break through). His nausea has also been better with phenergan and he has noted an increase in his appetite. His weight is stable at 272 lbs.  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  No falls or syncope to report.  No blood loss noted. No abnormal bruising, no petechiae.   ECOG Performance Status: 1 - Symptomatic but completely ambulatory  Medications:  Allergies as of 05/20/2020      Reactions   Dextromethorphan-guaifenesin Other (See Comments)   Irregular heartbeat   Doxycycline Anaphylaxis, Nausea And Vomiting, Rash, Other (See Comments)   "heart arrythmia" and "dyspepsia" (only oral doxycycline causes reaction)   Gadobutrol Hives, Other (See Comments)   Patient had MRI scan at [place]. Patient called one hour after he left imaging facility to report two "blisters" that came up on "back" of lip.    Gadolinium Derivatives Hives   Patient had MRI scan at [place]. Patient called one hour after he left imaging facility to report two "blisters" that came up on "back" of lip.    Guaifenesin Palpitations      Ibuprofen Hives, Itching   Lisinopril Other (See Comments)   Angioedema   Pantoprazole Itching   Tramadol Hives, Itching   Barium Rash   Developed redness around neck after drinking 1st bottle of Barocat; pt was given Benedryl by ED Mds to be "on the safe side" before drinking 2nd bottle   Chlorhexidine Hives      Medication List       Accurate as of May 20, 2020 10:05 AM. If you have any  questions, ask your nurse or doctor.        STOP taking these medications   azithromycin 250 MG tablet Commonly known as: ZITHROMAX Stopped by: Laverna Peace, NP   nystatin 100000 UNIT/ML suspension Commonly known as: MYCOSTATIN Stopped by: Laverna Peace, NP     TAKE these medications   baclofen 10 MG tablet Commonly known as: LIORESAL Take 1 tablet (10 mg total) by mouth 3 (three) times daily.   Banophen 25 MG tablet Generic drug: diphenhydrAMINE TAKE 1 TABLET BY MOUTH 1 HOUR BEFORE SCAN   Banophen 25 mg capsule Generic drug: diphenhydrAMINE TAKE 1 CAPSULE BY MOUTH 1 HOUR BEFORE CT SCAN    cyclobenzaprine 10 MG tablet Commonly known as: FLEXERIL Take 1 tablet (10 mg total) by mouth 3 (three) times daily as needed. What changed: Another medication with the same name was removed. Continue taking this medication, and follow the directions you see here. Changed by: Laverna Peace, NP   dexamethasone 4 MG tablet Commonly known as: DECADRON TAKE 2 TABLETS (8 MG TOTAL) BY MOUTH DAILY. START THE DAY AFTER CHEMOTHERAPY FOR 2 DAYS.   dronabinol 5 MG capsule Commonly known as: MARINOL TAKE 1 CAPSULE (5 MG TOTAL) BY MOUTH 2 (TWO) TIMES DAILY BEFORE A MEAL.   Eliquis 5 MG Tabs tablet Generic drug: apixaban TAKE 1 TABLET (5 MG TOTAL) BY MOUTH 2 (TWO) TIMES DAILY.   enoxaparin 120 MG/0.8ML injection Commonly known as: LOVENOX INJECT 0.8 MLS (120 MG TOTAL) INTO THE SKIN EVERY 12 (TWELVE) HOURS.   EPINEPHrine 0.3 mg/0.3 mL Soaj injection Commonly known as: EpiPen 2-Pak USE AS DIRECTED FOR LIFE THREATENING ALLERGIC REACTIONS   famotidine 40 MG tablet Commonly known as: PEPCID Take 1 tablet (40 mg total) by mouth 2 (two) times daily.   famotidine 40 MG tablet Commonly known as: PEPCID TAKE 1 TABLET (40 MG TOTAL) BY MOUTH 2 (TWO) TIMES DAILY.   fentaNYL 75 MCG/HR Commonly known as: DURAGESIC PLACE 1 PATCH ONTO THE SKIN EVERY 3 (THREE) DAYS.   fluconazole 100 MG tablet Commonly known as: DIFLUCAN TAKE 1 TABLET (100 MG TOTAL) BY MOUTH DAILY.   furosemide 80 MG tablet Commonly known as: LASIX TAKE 1 TABLET (80 MG TOTAL) BY MOUTH DAILY.   furosemide 80 MG tablet Commonly known as: LASIX TAKE 1 TABLET (80 MG TOTAL) BY MOUTH DAILY.   gabapentin 300 MG capsule Commonly known as: NEURONTIN TAKE 1 CAPSULE BY MOUTH THREE TIMES DAILY   gabapentin 300 MG capsule Commonly known as: NEURONTIN TAKE 1 CAPSULE BY MOUTH THREE TIMES DAILY   gabapentin 300 MG capsule Commonly known as: NEURONTIN TAKE 1 CAPSULE BY MOUTH THREE TIMES DAILY   Gavreto 100 MG Caps Generic drug:  Pralsetinib Take 400 mg by mouth daily.   HYDROmorphone 4 MG tablet Commonly known as: DILAUDID Take 1 tablet (4 mg total) by mouth every 4 (four) hours as needed for severe pain.   HYDROmorphone 4 MG tablet Commonly known as: DILAUDID TAKE 1 TABLET (4 MG TOTAL) BY MOUTH EVERY 4 (FOUR) HOURS AS NEEDED FOR SEVERE PAIN.   levothyroxine 88 MCG tablet Commonly known as: SYNTHROID Take 88 mcg by mouth daily before breakfast.   levothyroxine 75 MCG tablet Commonly known as: SYNTHROID TAKE ONE TABLET (75 MCG DOSE) BY MOUTH DAILY.   levothyroxine 200 MCG tablet Commonly known as: SYNTHROID TAKE 1 TABLET (200 MCG DOSE) BY MOUTH DAILY   levothyroxine 88 MCG tablet Commonly known as: SYNTHROID TAKE 1 TABLET (88 MCG DOSE) BY MOUTH  DAILY   levothyroxine 200 MCG tablet Commonly known as: SYNTHROID Take 1 tablet (200 mcg total) by mouth daily. Take along with 88 mcg=288 mcg   levothyroxine 200 MCG tablet Commonly known as: SYNTHROID TAKE 1 TABLET (200 MCG TOTAL) BY MOUTH DAILY. TAKE ALONG WITH 88 MCG=288 MCG   levothyroxine 200 MCG tablet Commonly known as: SYNTHROID TAKE 1 TABLET (200 MCG TOTAL) BY MOUTH DAILY.   levothyroxine 75 MCG tablet Commonly known as: SYNTHROID TAKE 1 TABLET BY MOUTH DAILY BEFORE BREAKFAST   lidocaine-prilocaine cream Commonly known as: EMLA APPLY TO AFFECTED AREA ONCE   LORazepam 0.5 MG tablet Commonly known as: ATIVAN TAKE 1 TABLET (0.5 MG TOTAL) BY MOUTH EVERY 6 (SIX) HOURS AS NEEDED (NAUSEA OR VOMITING).   LORazepam 0.5 MG tablet Commonly known as: ATIVAN TAKE 1 TABLET (0.5 MG TOTAL) BY MOUTH EVERY 6 (SIX) HOURS AS NEEDED (NAUSEA OR VOMITING).   LORazepam 0.5 MG tablet Commonly known as: ATIVAN TAKE 1 TABLET (0.5 MG TOTAL) BY MOUTH EVERY 6 (SIX) HOURS AS NEEDED (NAUSEA OR VOMITING).   LORazepam 0.5 MG tablet Commonly known as: ATIVAN TAKE 1 TABLET (0.5 MG TOTAL) BY MOUTH EVERY 6 (SIX) HOURS AS NEEDED (NAUSEA OR VOMITING).   LORazepam 0.5  MG tablet Commonly known as: ATIVAN TAKE 1 TABLET (0.5 MG TOTAL) BY MOUTH EVERY 6 (SIX) HOURS AS NEEDED (NAUSEA OR VOMITING).   LORazepam 0.5 MG tablet Commonly known as: ATIVAN TAKE 1 TABLET BY MOUTH EVERY 6 HOURS AS NEEDED FOR NAUSEA & VOMITING   LORazepam 0.5 MG tablet Commonly known as: ATIVAN TAKE 1 TABLET BY MOUTH EVERY 6 HOURS AS NEEDED FOR NAUSEA & VOMITING   LORazepam 0.5 MG tablet Commonly known as: ATIVAN TAKE 1 TABLET BY MOUTH EVERY 6 HOURS AS NEEDED FOR NAUSEA & VOMITING   LORazepam 0.5 MG tablet Commonly known as: ATIVAN TAKE 1 TABLET BY MOUTH EVERY 6 HOURS AS NEEDED FOR NAUSEA AND VOMITING   LORazepam 0.5 MG tablet Commonly known as: ATIVAN TAKE 1 TABLET BY MOUTH EVERY 6 HOURS AS NEEDED FOR NAUSEA & VOMITING   methylphenidate 10 MG tablet Commonly known as: RITALIN TAKE 1 TABLET (10 MG TOTAL) BY MOUTH 2 (TWO) TIMES DAILY. What changed: Another medication with the same name was changed. Make sure you understand how and when to take each.   methylphenidate 10 MG tablet Commonly known as: RITALIN TAKE 1 TABLET (10 MG TOTAL) BY MOUTH 2 (TWO) TIMES DAILY. What changed: when to take this   methylphenidate 10 MG tablet Commonly known as: RITALIN TAKE 1 TABLET (10 MG TOTAL) BY MOUTH 2 (TWO) TIMES DAILY. What changed: Another medication with the same name was changed. Make sure you understand how and when to take each.   metoCLOPramide 10 MG tablet Commonly known as: REGLAN Take 1 tablet (10 mg total) by mouth every 6 (six) hours as needed for nausea or vomiting.   metoCLOPramide 10 MG tablet Commonly known as: REGLAN TAKE 1 TABLET (10 MG TOTAL) BY MOUTH EVERY 6 (SIX) HOURS AS NEEDED FOR NAUSEA OR VOMITING.   metolazone 5 MG tablet Commonly known as: ZAROXOLYN TAKE 1 TABLET (5 MG TOTAL) BY MOUTH DAILY. TAKE 1 HOUR BEFORE LASIX.   metolazone 5 MG tablet Commonly known as: ZAROXOLYN TAKE 1 TABLET (5 MG TOTAL) BY MOUTH DAILY. TAKE 1 HOUR BEFORE LASIX.    multivitamin capsule Take 1 capsule by mouth daily.   OLANZapine 10 MG tablet Commonly known as: ZYPREXA TAKE 1 TABLET (10 MG TOTAL)  BY MOUTH AT BEDTIME. What changed: Another medication with the same name was removed. Continue taking this medication, and follow the directions you see here. Changed by: Laverna Peace, NP   ondansetron 8 MG tablet Commonly known as: ZOFRAN TAKE 1 TABLET (8 MG TOTAL) BY MOUTH 2 (TWO) TIMES DAILY AS NEEDED FOR REFRACTORY NAUSEA / VOMITING. START ON DAY 3 AFTER CHEMO.   Oxycodone HCl 10 MG Tabs TAKE 1 TABLET (10 MG TOTAL) BY MOUTH EVERY 6 (SIX) HOURS AS NEEDED. What changed: Another medication with the same name was removed. Continue taking this medication, and follow the directions you see here. Changed by: Laverna Peace, NP   Oxycodone HCl 10 MG Tabs TAKE 1 TABLET (10 MG TOTAL) BY MOUTH EVERY 6 (SIX) HOURS AS NEEDED. What changed: Another medication with the same name was removed. Continue taking this medication, and follow the directions you see here. Changed by: Laverna Peace, NP   Oxycodone HCl 10 MG Tabs TAKE 1 TABLET (10 MG TOTAL) BY MOUTH EVERY 6 (SIX) HOURS AS NEEDED. What changed: Another medication with the same name was removed. Continue taking this medication, and follow the directions you see here. Changed by: Laverna Peace, NP   Oxycodone HCl 10 MG Tabs TAKE 1 TABLET BY MOUTH EVERY 6 HOURS AS NEEDED What changed: Another medication with the same name was removed. Continue taking this medication, and follow the directions you see here. Changed by: Laverna Peace, NP   Oxycodone HCl 10 MG Tabs TAKE 1 TABLET BY MOUTH EVERY 6 HOURS AS NEEDED What changed: Another medication with the same name was removed. Continue taking this medication, and follow the directions you see here. Changed by: Laverna Peace, NP   OxyCONTIN 30 MG 12 hr tablet Generic drug: oxyCODONE TAKE 1 TABLET (30 MG TOTAL) BY MOUTH EVERY 8 (EIGHT)  HOURS. What changed: Another medication with the same name was removed. Continue taking this medication, and follow the directions you see here. Changed by: Laverna Peace, NP   oxyCODONE 20 mg 12 hr tablet Commonly known as: OXYCONTIN Take 1 tablet (20 mg total) by mouth every 12 (twelve) hours. What changed: Another medication with the same name was removed. Continue taking this medication, and follow the directions you see here. Changed by: Laverna Peace, NP   polyethylene glycol 17 g packet Commonly known as: MIRALAX / GLYCOLAX Take 17 g by mouth daily as needed for mild constipation.   potassium chloride 10 MEQ tablet Commonly known as: KLOR-CON TAKE 1 TABLET BY MOUTH ONCE DAILY What changed: Another medication with the same name was removed. Continue taking this medication, and follow the directions you see here. Changed by: Laverna Peace, NP   predniSONE 50 MG tablet Commonly known as: DELTASONE TAKE 1 TABLET BY MOUTH 13 HOURS BEFORE CT SCAN, 1 TABLET 7 HOURS BEFORE CT SCAN, AND 1 TABLET 1 HOUR BEFORE CT SCAN What changed: Another medication with the same name was removed. Continue taking this medication, and follow the directions you see here. Changed by: Laverna Peace, NP   prochlorperazine 10 MG tablet Commonly known as: COMPAZINE TAKE 1 TABLET (10 MG TOTAL) BY MOUTH EVERY 6 (SIX) HOURS AS NEEDED (NAUSEA OR VOMITING).   promethazine 25 MG tablet Commonly known as: PHENERGAN TAKE 1 TABLET BY MOUTH EVERY 6 HOURS AS NEEDED FOR NAUSEA & VOMITING What changed: Another medication with the same name was removed. Continue taking this medication, and follow the directions you see here. Changed by: Laverna Peace, NP  senna-docusate 8.6-50 MG tablet Commonly known as: Senokot-S Take 1 tablet by mouth at bedtime as needed for mild constipation.       Allergies:  Allergies  Allergen Reactions  . Dextromethorphan-Guaifenesin Other (See Comments)    Irregular  heartbeat   . Doxycycline Anaphylaxis, Nausea And Vomiting, Rash and Other (See Comments)    "heart arrythmia" and "dyspepsia" (only oral doxycycline causes reaction)  . Gadobutrol Hives and Other (See Comments)    Patient had MRI scan at [place]. Patient called one hour after he left imaging facility to report two "blisters" that came up on "back" of lip.    . Gadolinium Derivatives Hives    Patient had MRI scan at [place]. Patient called one hour after he left imaging facility to report two "blisters" that came up on "back" of lip.   . Guaifenesin Palpitations       . Ibuprofen Hives and Itching  . Lisinopril Other (See Comments)    Angioedema  . Pantoprazole Itching  . Tramadol Hives and Itching  . Barium Rash    Developed redness around neck after drinking 1st bottle of Barocat; pt was given Benedryl by ED Mds to be "on the safe side" before drinking 2nd bottle  . Chlorhexidine Hives    Past Medical History, Surgical history, Social history, and Family History were reviewed and updated.  Review of Systems: All other 10 point review of systems is negative.   Physical Exam:  vitals were not taken for this visit.   Wt Readings from Last 3 Encounters:  05/01/20 272 lb 1.3 oz (123.4 kg)  05/01/20 272 lb 1.3 oz (123.4 kg)  04/22/20 278 lb (126.1 kg)    Ocular: Sclerae unicteric, pupils equal, round and reactive to light Ear-nose-throat: Oropharynx clear, dentition fair Lymphatic: No cervical, supraclavicular or axillary adenopathy Lungs no rales or rhonchi, good excursion bilaterally Heart regular rate and rhythm, no murmur appreciated Abd soft, nontender, positive bowel sounds MSK no focal spinal tenderness, no joint edema Neuro: non-focal, well-oriented, appropriate affect Breasts: Deferred   Lab Results  Component Value Date   WBC 5.4 05/20/2020   HGB 12.7 (L) 05/20/2020   HCT 39.0 05/20/2020   MCV 86.1 05/20/2020   PLT 249 05/20/2020   Lab  Results  Component Value Date   FERRITIN 717 (H) 04/08/2020   IRON 21 (L) 04/08/2020   TIBC 204 04/08/2020   UIBC 183 04/08/2020   IRONPCTSAT 10 (L) 04/08/2020   Lab Results  Component Value Date   RETICCTPCT 2.2 05/20/2020   RBC 4.53 05/20/2020   No results found for: KPAFRELGTCHN, LAMBDASER, KAPLAMBRATIO No results found for: IGGSERUM, IGA, IGMSERUM No results found for: Odetta Pink, SPEI   Chemistry      Component Value Date/Time   NA 136 05/20/2020 0932   NA 141 02/06/2019 1210   K 4.3 05/20/2020 0932   CL 99 05/20/2020 0932   CO2 30 05/20/2020 0932   BUN 18 05/20/2020 0932   BUN 8 02/06/2019 1210   CREATININE 1.15 05/20/2020 0932      Component Value Date/Time   CALCIUM 9.7 05/20/2020 0932   ALKPHOS 117 05/20/2020 0932   AST 36 05/20/2020 0932   ALT 33 05/20/2020 0932   BILITOT 0.3 05/20/2020 0932       Impression and Plan: Brian [name] is a very pleasant 45 yo caucasian gentleman with an 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 He was  able to start [facility] 4 days ago and is tolerating well so far. He will continue his same regimen.  He will be going to [facility] to see oncologic endocrinologist Dr. Ofilia Neas at the end of the month for further work up.  Patient would like to take a daily iron supplement. Prescription for Fusion plus sent downstairs.  We will also refill has dilaudid.  MD follow-up in another 3 weeks once he has returned from [place].   He was encouraged to contact our office with any questions or concerns. We can certainly see him sooner and bring him in for symptom management if needed.   Laverna Peace, NP 4/4/202210:05 AM

## 2020-05-20 NOTE — Patient Instructions (Signed)
Dehydration, Adult Dehydration is a condition in which there is not enough water or other fluids in the body. This happens when a person loses more fluids than he or she takes in. Important organs, such as the kidneys, brain, and heart, cannot function without a proper amount of fluids. Any loss of fluids from the body can lead to dehydration. Dehydration can be mild, moderate, or severe. It should be treated right away to prevent it from becoming severe. What are the causes? Dehydration may be caused by:  Conditions that cause loss of water or other fluids, such as diarrhea, vomiting, or sweating or urinating a lot.  Not drinking enough fluids, especially when you are ill or doing activities that require a lot of energy.  Other illnesses and conditions, such as fever or infection.  Certain medicines, such as medicines that remove excess fluid from the body (diuretics).  Lack of safe drinking water.  Not being able to get enough water and food. What increases the risk? The following factors may make you more likely to develop this condition:  Having a long-term (chronic) illness that has not been treated properly, such as diabetes, heart disease, or kidney disease.  Being 65 years of age or older.  Having a disability.  Living in a place that is high in altitude, where thinner, drier air causes more fluid loss.  Doing exercises that put stress on your body for a long time (endurance sports). What are the signs or symptoms? Symptoms of dehydration depend on how severe it is. Mild or moderate dehydration  Thirst.  Dry lips or dry mouth.  Dizziness or light-headedness, especially when standing up from a seated position.  Muscle cramps.  Dark urine. Urine may be the color of tea.  Less urine or tears produced than usual.  Headache. Severe dehydration  Changes in skin. Your skin may be cold and clammy, blotchy, or pale. Your skin also may not return to normal after being  lightly pinched and released.  Little or no tears, urine, or sweat.  Changes in vital signs, such as rapid breathing and low blood pressure. Your pulse may be weak or may be faster than 100 beats a minute when you are sitting still.  Other changes, such as: ? Feeling very thirsty. ? Sunken eyes. ? Cold hands and feet. ? Confusion. ? Being very tired (lethargic) or having trouble waking from sleep. ? Short-term weight loss. ? Loss of consciousness. How is this diagnosed? This condition is diagnosed based on your symptoms and a physical exam. You may have blood and urine tests to help confirm the diagnosis. How is this treated? Treatment for this condition depends on how severe it is. Treatment should be started right away. Do not wait until dehydration becomes severe. Severe dehydration is an emergency and needs to be treated in a hospital.  Mild or moderate dehydration can be treated at home. You may be asked to: ? Drink more fluids. ? Drink an oral rehydration solution (ORS). This drink helps restore proper amounts of fluids and salts and minerals in the blood (electrolytes).  Severe dehydration can be treated: ? With IV fluids. ? By correcting abnormal levels of electrolytes. This is often done by giving electrolytes through a tube that is passed through your nose and into your stomach (nasogastric tube, or NG tube). ? By treating the underlying cause of dehydration. Follow these instructions at home: Oral rehydration solution If told by your health care provider, drink an ORS:  Make   an ORS by following instructions on the package.  Start by drinking small amounts, about  cup (120 mL) every 5-10 minutes.  Slowly increase how much you drink until you have taken the amount recommended by your health care provider. Eating and drinking  Drink enough clear fluid to keep your urine pale yellow. If you were told to drink an ORS, finish the ORS first and then start slowly drinking  other clear fluids. Drink fluids such as: ? Water. Do not drink only water. Doing that can lead to hyponatremia, which is having too little salt (sodium) in the body. ? Water from ice chips you suck on. ? Fruit juice that you have added water to (diluted fruit juice). ? Low-calorie sports drinks.  Eat foods that contain a healthy balance of electrolytes, such as bananas, oranges, potatoes, tomatoes, and spinach.  Do not drink alcohol.  Avoid the following: ? Drinks that contain a lot of sugar. These include high-calorie sports drinks, fruit juice that is not diluted, and soda. ? Caffeine. ? Foods that are greasy or contain a lot of fat or sugar.         General instructions  Take over-the-counter and prescription medicines only as told by your health care provider.  Do not take sodium tablets. Doing that can lead to having too much sodium in the body (hypernatremia).  Return to your normal activities as told by your health care provider. Ask your health care provider what activities are safe for you.  Keep all follow-up visits as told by your health care provider. This is important. Contact a health care provider if:  You have muscle cramps, pain, or discomfort, such as: ? Pain in your abdomen and the pain gets worse or stays in one area (localizes). ? Stiff neck.  You have a rash.  You are more irritable than usual.  You are sleepier or have a harder time waking than usual.  You feel weak or dizzy.  You feel very thirsty. Get help right away if you have:  Any symptoms of severe dehydration.  Symptoms of vomiting, such as: ? You cannot eat or drink without vomiting. ? Vomiting gets worse or does not go away. ? Vomit includes blood or green matter (bile).  Symptoms that get worse with treatment.  A fever.  A severe headache.  Problems with urination or bowel movements, such as: ? Diarrhea that gets worse or does not go away. ? Blood in your stool (feces).  This may cause stool to look black and tarry. ? Not urinating, or urinating only a small amount of very dark urine, within 6-8 hours.  Trouble breathing. These symptoms may represent a serious problem that is an emergency. Do not wait to see if the symptoms will go away. Get medical help right away. Call your local emergency services (911 in the [place] Do not drive yourself to the hospital. Summary  Dehydration is a condition in which there is not enough water or other fluids in the body. This happens when a person loses more fluids than he or she takes in.  Treatment for this condition depends on how severe it is. Treatment should be started right away. Do not wait until dehydration becomes severe.  Drink enough clear fluid to keep your urine pale yellow. If you were told to drink an oral rehydration solution (ORS), finish the ORS first and then start slowly drinking other clear fluids.  Take over-the-counter and prescription medicines only as told by your health   care provider.  Get help right away if you have any symptoms of severe dehydration. This information is not intended to replace advice given to you by your health care provider. Make sure you discuss any questions you have with your health care provider. Document Revised: 09/15/2018 Document Reviewed: 09/15/2018 Elsevier Patient Education  2021 [employer] Inc[employer]  

## 2020-05-20 NOTE — Patient Instructions (Signed)

## 2020-05-23 DIAGNOSIS — C7989 Secondary malignant neoplasm of other specified sites: Secondary | ICD-10-CM | POA: Diagnosis not present

## 2020-06-03 ENCOUNTER — Other Ambulatory Visit (HOSPITAL_BASED_OUTPATIENT_CLINIC_OR_DEPARTMENT_OTHER): Payer: Self-pay

## 2020-06-03 DIAGNOSIS — E890 Postprocedural hypothyroidism: Secondary | ICD-10-CM | POA: Diagnosis not present

## 2020-06-03 DIAGNOSIS — C73 Malignant neoplasm of thyroid gland: Secondary | ICD-10-CM | POA: Diagnosis not present

## 2020-06-03 MED ORDER — LEVOTHYROXINE SODIUM 88 MCG PO TABS
ORAL_TABLET | ORAL | 3 refills | Status: DC
  Filled 2020-06-03 – 2020-06-11 (×3): qty 90, 90d supply, fill #0
  Filled 2020-09-23: qty 90, 90d supply, fill #1
  Filled 2021-01-06: qty 90, 90d supply, fill #2

## 2020-06-03 MED ORDER — LEVOTHYROXINE SODIUM 200 MCG PO TABS
ORAL_TABLET | ORAL | 3 refills | Status: DC
  Filled 2020-06-03: qty 90, 90d supply, fill #0
  Filled 2020-09-23: qty 90, 90d supply, fill #1
  Filled 2021-01-06: qty 90, 90d supply, fill #2
  Filled 2021-04-14: qty 90, 90d supply, fill #3

## 2020-06-05 ENCOUNTER — Telehealth: Payer: Self-pay | Admitting: Radiation Oncology

## 2020-06-05 NOTE — Telephone Encounter (Signed)
Patient Brian [name] with answering service requesting to cx his 4/21 FU with [facility]. I reached out to the patient to confirm he wanted to cx and asked if he wanted to r/s. Apparently, patient was not aware that his FU is scheduled to be via telephone. Patient agreed to keep FU appointment.

## 2020-06-06 ENCOUNTER — Ambulatory Visit
Admission: RE | Admit: 2020-06-06 | Discharge: 2020-06-06 | Disposition: A | Discharge status: Home / Self Care | Payer: 59 | Source: Ambulatory Visit | Attending: Urology | Admitting: Urology

## 2020-06-06 ENCOUNTER — Other Ambulatory Visit: Payer: Self-pay

## 2020-06-06 DIAGNOSIS — C799 Secondary malignant neoplasm of unspecified site: Secondary | ICD-10-CM

## 2020-06-06 DIAGNOSIS — C73 Malignant neoplasm of thyroid gland: Secondary | ICD-10-CM

## 2020-06-06 DIAGNOSIS — C787 Secondary malignant neoplasm of liver and intrahepatic bile duct: Secondary | ICD-10-CM

## 2020-06-06 NOTE — Progress Notes (Signed)
  Radiation Oncology         [phone] ________________________________  Name: Brian [name] MRN: [id number]  Date: 04/26/2020  DOB: 02-Jun-1975  End of Treatment Note  Diagnosis:   45 year old male with progressive metastatic thyroid cancer involving the liver and a portacaval lymph node.     Indication for treatment:  Palliative       Radiation treatment dates:   04/15/20 - 04/26/20  Site/dose:   The liver nodule and portacaval lymph node were treated to a prescription dose of 30 Gy in 10 fractions of 3 Gy each.  Beams/energy:   A 3D field set-up was employed with 6 MV X-rays  Narrative: The patient tolerated radiation treatment relatively well.   He did continue to struggle with significant abdominal pain with nausea and anorexia but denied diarrhea.  Plan: The patient has completed radiation treatment. The patient will return to radiation oncology clinic for routine followup in one month. I advised him to call or return sooner if he has any questions or concerns related to his recovery or treatment. ________________________________  Sheral Apley. Tammi Klippel, M.D.  .[email or web address]

## 2020-06-06 NOTE — Progress Notes (Signed)
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     Interval Since Last Radiation:  6 weeks  04/15/20 - 04/26/20:  The liver nodule and portacaval lymph node were treated to a prescription dose of 30 Gy in 10 fractions of 3 Gy each.  Narrative:  The patient returns today for routine follow-up.  He tolerated radiation treatment relatively well.   He did continue to struggle with significant abdominal pain with nausea and anorexia but denied diarrhea.                             On review of systems, the patient states that he is doing well in general.  He has had some residual dry skin and itching on the right side of his back but denies any visible rash, erythema or increased in the area.  He has continued with abdominal pain but this is well controlled on his current medication regimen.  He denies any residual nausea and has regained his appetite which he is quite pleased with.  He has also noticed a gradual return of energy and is now able to spend time actively with his son.  He has recently started a new systemic treatment with Gavreto on 05/17/2020 and is tolerating this well.  Unfortunately, his follow-up MRI abdomen on 05/09/2020 showed continued progression of the liver metastases, now multifocal and increased size of the recently treated.  There was also progression in the treated portacaval node.  He has a scheduled appointment with Dr. Ofilia Neas, oncologist at [facility] to begin further work up for treatment options 4/28-30.  ALLERGIES:  is allergic to dextromethorphan-guaifenesin, doxycycline, gadobutrol, gadolinium derivatives, guaifenesin, ibuprofen, lisinopril, pantoprazole, tramadol, barium, and  chlorhexidine.  Meds: Current Outpatient Medications  Medication Sig Dispense Refill  . apixaban (ELIQUIS) 5 MG TABS tablet TAKE 1 TABLET (5 MG TOTAL) BY MOUTH 2 (TWO) TIMES DAILY. 60 tablet 6  . baclofen (LIORESAL) 10 MG tablet Take 1 tablet (10 mg total) by mouth 3 (three) times daily. 90 each 0  . cyclobenzaprine (FLEXERIL) 10 MG tablet Take 1 tablet (10 mg total) by mouth 3 (three) times daily as needed. 30 tablet 1  . diphenhydrAMINE (BENADRYL) 25 mg capsule TAKE 1 CAPSULE BY MOUTH 1 HOUR BEFORE CT SCAN 100 capsule 0  . EPINEPHrine (EPIPEN 2-PAK) 0.3 mg/0.3 mL IJ SOAJ injection USE AS DIRECTED FOR LIFE THREATENING ALLERGIC REACTIONS 4 Device 3  . furosemide (LASIX) 80 MG tablet TAKE 1 TABLET (80 MG TOTAL) BY MOUTH DAILY. 30 tablet 1  . gabapentin (NEURONTIN) 300 MG capsule TAKE 1 CAPSULE BY MOUTH THREE TIMES DAILY 90 capsule 2  . HYDROmorphone (DILAUDID) 4 MG tablet Take 1 tablet (4 mg total) by mouth every 4 (four) hours as needed for severe pain. 30 tablet 0  . levothyroxine (SYNTHROID) 200 MCG tablet Take 1 tablet (200 mcg total) by mouth daily. Take along with 88 mcg=288 mcg 30 tablet 1  . levothyroxine (SYNTHROID) 88 MCG tablet TAKE 1 TABLET (88 MCG DOSE) BY MOUTH DAILY 30 tablet 5  . methylphenidate (RITALIN) 10 MG tablet TAKE 1 TABLET (10 MG TOTAL) BY MOUTH 2 (TWO) TIMES DAILY. (Patient  taking differently: Take 10 mg by mouth daily.) 60 tablet 0  . metoCLOPramide (REGLAN) 10 MG tablet Take 1 tablet (10 mg total) by mouth every 6 (six) hours as needed for nausea or vomiting. 30 tablet 0  . metolazone (ZAROXOLYN) 5 MG tablet TAKE 1 TABLET (5 MG TOTAL) BY MOUTH DAILY. TAKE 1 HOUR BEFORE LASIX. 30 tablet 1  . Multiple Vitamin (MULTIVITAMIN) capsule Take 1 capsule by mouth daily.    Marland Kitchen OLANZapine (ZYPREXA) 10 MG tablet TAKE 1 TABLET (10 MG TOTAL) BY MOUTH AT BEDTIME. 30 tablet 4  . ondansetron (ZOFRAN) 8 MG tablet TAKE 1 TABLET (8 MG TOTAL) BY MOUTH 2 (TWO) TIMES DAILY AS NEEDED FOR  REFRACTORY NAUSEA / VOMITING. START ON DAY 3 AFTER CHEMO. 30 tablet 1  . polyethylene glycol (MIRALAX / GLYCOLAX) 17 g packet Take 17 g by mouth daily as needed for mild constipation. 14 each 0  . Pralsetinib (GAVRETO) 100 MG CAPS Take 400 mg by mouth daily. 120 capsule 5  . prochlorperazine (COMPAZINE) 10 MG tablet TAKE 1 TABLET (10 MG TOTAL) BY MOUTH EVERY 6 (SIX) HOURS AS NEEDED (NAUSEA OR VOMITING). 30 tablet 1  . promethazine (PHENERGAN) 25 MG tablet TAKE 1 TABLET BY MOUTH EVERY 6 HOURS AS NEEDED FOR NAUSEA & VOMITING 120 tablet 0  . dexamethasone (DECADRON) 4 MG tablet TAKE 2 TABLETS (8 MG TOTAL) BY MOUTH DAILY. START THE DAY AFTER CHEMOTHERAPY FOR 2 DAYS. (Patient not taking: Reported on 06/05/2020) 30 tablet 1  . dronabinol (MARINOL) 5 MG capsule TAKE 1 CAPSULE (5 MG TOTAL) BY MOUTH 2 (TWO) TIMES DAILY BEFORE A MEAL. (Patient not taking: Reported on 06/05/2020) 60 capsule 0  . enoxaparin (LOVENOX) 120 MG/0.8ML injection INJECT 0.8 MLS (120 MG TOTAL) INTO THE SKIN EVERY 12 (TWELVE) HOURS. (Patient not taking: Reported on 06/05/2020) 38.4 mL 0  . famotidine (PEPCID) 40 MG tablet Take 1 tablet (40 mg total) by mouth 2 (two) times daily. (Patient not taking: Reported on 06/05/2020) 60 tablet 4  . Iron-FA-B Cmp-C-Biot-Probiotic (FUSION PLUS) CAPS Take 1 capsule by mouth daily. (Patient not taking: Reported on 06/05/2020) 30 capsule 6  . levothyroxine (SYNTHROID) 200 MCG tablet Take one tablet (200 mcg dose) by mouth daily. Total:  288 mcg/day. (Patient not taking: Reported on 06/05/2020) 90 tablet 3  . levothyroxine (SYNTHROID) 88 MCG tablet Take one tablet (88 mcg dose) by mouth daily. Total:  288 mcg/day. (Patient not taking: Reported on 06/05/2020) 90 tablet 3  . lidocaine-prilocaine (EMLA) cream APPLY TO AFFECTED AREA ONCE (Patient not taking: Reported on 06/05/2020) 30 g 3  . LORazepam (ATIVAN) 0.5 MG tablet TAKE 1 TABLET BY MOUTH EVERY 6 HOURS AS NEEDED FOR NAUSEA & VOMITING (Patient not taking:  Reported on 06/05/2020) 30 tablet 0  . oxyCODONE (OXYCONTIN) 20 mg 12 hr tablet Take 1 tablet (20 mg total) by mouth every 12 (twelve) hours. (Patient not taking: Reported on 06/05/2020) 14 tablet 0  . potassium chloride (KLOR-CON) 10 MEQ tablet TAKE 1 TABLET BY MOUTH ONCE DAILY (Patient not taking: Reported on 06/05/2020) 30 tablet 1  . predniSONE (DELTASONE) 50 MG tablet TAKE 1 TABLET BY MOUTH 13 HOURS BEFORE CT SCAN, 1 TABLET 7 HOURS BEFORE CT SCAN, AND 1 TABLET 1 HOUR BEFORE CT SCAN (Patient not taking: No sig reported) 3 tablet 0  . senna-docusate (SENOKOT-S) 8.6-50 MG tablet Take 1 tablet by mouth at bedtime as needed for mild constipation. (Patient not taking: Reported on 06/05/2020) 30 tablet 0  No current facility-administered medications for this encounter.   Facility-Administered Medications Ordered in Other Encounters  Medication Dose Route Frequency Provider Last Rate Last Admin  . HYDROmorphone (DILAUDID) injection 4 mg  4 mg Intravenous Once [name], MD        Physical Findings:  vitals were not taken for this visit.   Karen Kays to assess due to telephone follow-up visit format.  Lab Findings: Lab Results  Component Value Date   WBC 5.4 05/20/2020   HGB 12.7 (L) 05/20/2020   HCT 39.0 05/20/2020   MCV 86.1 05/20/2020   PLT 249 05/20/2020     Radiographic Findings: CT Chest W Contrast  Result Date: 05/10/2020 CLINICAL DATA:  Metastatic thyroid cancer. EXAM: CT CHEST WITH CONTRAST TECHNIQUE: Multidetector CT imaging of the chest was performed during intravenous contrast administration. CONTRAST:  5mL OMNIPAQUE IOHEXOL 300 MG/ML  SOLN COMPARISON:  01/23/2020 FINDINGS: Cardiovascular: The heart is normal in size. No pericardial effusion. The aorta is normal in caliber. No dissection. Stable coronary artery calcifications, advanced for age. Mediastinum/Nodes: No mediastinal hilar mass lymphadenopathy. The esophagus is unremarkable. Stable surgical changes from prior  thyroidectomy with a resolving postoperative fluid collection in the right thyroid bed area. Lungs/Pleura: No pulmonary lesions worrisome pulmonary nodules to suggest metastatic disease. No acute pulmonary findings. No pleural effusions or pleural lesions. Upper Abdomen: Large heterogeneous low-attenuation mass noted in the segment 8 region of the liver consistent with known metastatic disease. Other smaller lesions are identified. Large periportal necrotic appearing nodal mass measures 6 cm. Recommend correlation with MRI abdomen, same date. Musculoskeletal: No chest wall mass, supraclavicular or axillary adenopathy. The left-sided Port-A-Cath is stable. The bony structures are unremarkable. IMPRESSION: 1. Stable surgical changes from prior thyroidectomy with a resolving postoperative fluid collection in the right thyroid bed area. 2. No findings for pulmonary metastatic disease. 3. Large heterogeneous low-attenuation segment 8 liver mass consistent with known metastatic disease. Other smaller lesions are identified. 4. Large periportal necrotic appearing nodal mass. Recommend correlation with MRI abdomen, same date. 5. Stable coronary artery calcifications, advanced for age. 6. Aortic atherosclerosis. Aortic Atherosclerosis (ICD10-I70.0). Electronically Signed   By: Marijo Sanes M.D.   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 CONTRAST:  73mL GADAVIST GADOBUTROL 1 MMOL/ML IV SOLN COMPARISON:  CT abdomen/pelvis dated 04/11/2020. MRI abdomen dated 01/29/2020. FINDINGS: Lower chest: Lung bases are clear. Hepatobiliary: Multifocal hepatic metastases, progressive from prior MR. (Comparison to recent CT is difficult due to  differences in technique, where the smaller individual lesions are less conspicuous.) Index lesions are as follows: --7.6 x 5.8 cm aggregate/conglomerate lesion in segment 8 (series 5/image 14), previously 6.3 x 4.4 cm --Adjacent 3.8 cm lesion in segment 8 (series 5/image 17), previously 2.7 cm --1.6 cm enhancing lesion in segment 4 (series 19/image 30), previously 13 mm --3.5 cm enhancing lesion in segment 5 (series 19/image 40), previously 2.3 cm Status post cholecystectomy. No intrahepatic or extrahepatic ductal dilatation. Pancreas:  Within normal limits. Spleen:  Within normal limits. Adrenals/Urinary Tract:  Adrenal glands are within normal limits. Kidneys are within normal limits.  No hydronephrosis. Stomach/Bowel: Stomach is within normal limits. Visualized bowel is unremarkable. Vascular/Lymphatic:  No evidence of abdominal aortic aneurysm. 4.7 cm short axis portacaval node (  series 5/image 27), previously 5.3 cm on recent CT, similar. This was 3.8 cm short axis on prior MRI, progressive. Other:  No abdominal ascites. Musculoskeletal: No focal osseous lesions. IMPRESSION: Multifocal hepatic metastases, progressive from prior MR. Index lesions are measured above. 4.7 cm short axis portacaval node, progressive. Electronically Signed   By: Julian Hy M.D.   On: 05/10/2020 07:45    Impression/Plan: 91. 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   He appears to have recovered well from the effects of his recent radiotherapy and is currently without complaints aside from residual fatigue which is gradually improving. He has recently started a new systemic treatment with Gavreto on 05/17/2020 and is tolerating this well.  Unfortunately, his follow-up MRI abdomen on 05/09/2020 showed continued progression of the liver metastases, now multifocal and increased size of the recently treated.  There was also progression in the treated portacaval  node.  He has a scheduled appointment with Dr. Ofilia Neas, oncologist at [facility] to begin further work up for treatment options 4/28-30.  We discussed that while we are happy to continue to participate in his care if clinically indicated, at this point, we will plan to see him back on an as-needed basis.  We enjoyed taking care of him and look forward to continuing to follow his progress.  He knows that he is welcome to call at anytime with any further questions or concerns related to his previous radiotherapy.    Nicholos Johns, PA-C

## 2020-06-10 ENCOUNTER — Other Ambulatory Visit (HOSPITAL_BASED_OUTPATIENT_CLINIC_OR_DEPARTMENT_OTHER): Payer: Self-pay

## 2020-06-10 ENCOUNTER — Encounter: Payer: Self-pay | Admitting: Hematology & Oncology

## 2020-06-10 ENCOUNTER — Inpatient Hospital Stay (HOSPITAL_BASED_OUTPATIENT_CLINIC_OR_DEPARTMENT_OTHER): Payer: 59 | Admitting: Hematology & Oncology

## 2020-06-10 ENCOUNTER — Other Ambulatory Visit: Payer: Self-pay | Admitting: Hematology & Oncology

## 2020-06-10 ENCOUNTER — Other Ambulatory Visit: Payer: Self-pay

## 2020-06-10 ENCOUNTER — Inpatient Hospital Stay: Payer: 59

## 2020-06-10 ENCOUNTER — Other Ambulatory Visit: Payer: Self-pay | Admitting: *Deleted

## 2020-06-10 VITALS — BP 164/107 | HR 110 | Temp 98.0°F | Resp 19 | Wt 284.0 lb

## 2020-06-10 DIAGNOSIS — C73 Malignant neoplasm of thyroid gland: Secondary | ICD-10-CM

## 2020-06-10 DIAGNOSIS — E860 Dehydration: Secondary | ICD-10-CM

## 2020-06-10 DIAGNOSIS — D509 Iron deficiency anemia, unspecified: Secondary | ICD-10-CM

## 2020-06-10 DIAGNOSIS — C799 Secondary malignant neoplasm of unspecified site: Secondary | ICD-10-CM

## 2020-06-10 DIAGNOSIS — R5383 Other fatigue: Secondary | ICD-10-CM | POA: Diagnosis not present

## 2020-06-10 DIAGNOSIS — R110 Nausea: Secondary | ICD-10-CM | POA: Diagnosis not present

## 2020-06-10 DIAGNOSIS — C787 Secondary malignant neoplasm of liver and intrahepatic bile duct: Secondary | ICD-10-CM | POA: Diagnosis not present

## 2020-06-10 LAB — CBC WITH DIFFERENTIAL (CANCER CENTER ONLY)
Abs Immature Granulocytes: 0.01 10*3/uL (ref 0.00–0.07)
Basophils Absolute: 0 10*3/uL (ref 0.0–0.1)
Basophils Relative: 0 %
Eosinophils Absolute: 0 10*3/uL (ref 0.0–0.5)
Eosinophils Relative: 1 %
HCT: 36.8 % — ABNORMAL LOW (ref 39.0–52.0)
Hemoglobin: 12.3 g/dL — ABNORMAL LOW (ref 13.0–17.0)
Immature Granulocytes: 0 %
Lymphocytes Relative: 21 %
Lymphs Abs: 0.7 10*3/uL (ref 0.7–4.0)
MCH: 28.8 pg (ref 26.0–34.0)
MCHC: 33.4 g/dL (ref 30.0–36.0)
MCV: 86.2 fL (ref 80.0–100.0)
Monocytes Absolute: 0.2 10*3/uL (ref 0.1–1.0)
Monocytes Relative: 6 %
Neutro Abs: 2.2 10*3/uL (ref 1.7–7.7)
Neutrophils Relative %: 72 %
Platelet Count: 251 10*3/uL (ref 150–400)
RBC: 4.27 MIL/uL (ref 4.22–5.81)
RDW: 17.2 % — ABNORMAL HIGH (ref 11.5–15.5)
WBC Count: 3.1 10*3/uL — ABNORMAL LOW (ref 4.0–10.5)
nRBC: 0 % (ref 0.0–0.2)

## 2020-06-10 LAB — FERRITIN: Ferritin: 1299 ng/mL — ABNORMAL HIGH (ref 24–336)

## 2020-06-10 LAB — CMP (CANCER CENTER ONLY)
ALT: 40 U/L (ref 0–44)
AST: 38 U/L (ref 15–41)
Albumin: 3.9 g/dL (ref 3.5–5.0)
Alkaline Phosphatase: 113 U/L (ref 38–126)
Anion gap: 7 (ref 5–15)
BUN: 21 mg/dL — ABNORMAL HIGH (ref 6–20)
CO2: 29 mmol/L (ref 22–32)
Calcium: 9.3 mg/dL (ref 8.9–10.3)
Chloride: 101 mmol/L (ref 98–111)
Creatinine: 0.99 mg/dL (ref 0.61–1.24)
GFR, Estimated: >60 mL/min (ref >60)
Glucose, Bld: 123 mg/dL — ABNORMAL HIGH (ref 70–99)
Potassium: 4 mmol/L (ref 3.5–5.1)
Sodium: 137 mmol/L (ref 135–145)
Total Bilirubin: 0.4 mg/dL (ref 0.3–1.2)
Total Protein: 6.1 g/dL — ABNORMAL LOW (ref 6.5–8.1)

## 2020-06-10 LAB — RETICULOCYTES
Immature Retic Fract: 23.5 % — ABNORMAL HIGH (ref 2.3–15.9)
RBC.: 4.21 MIL/uL — ABNORMAL LOW (ref 4.22–5.81)
Retic Count, Absolute: 91.8 10*3/uL (ref 19.0–186.0)
Retic Ct Pct: 2.2 % (ref 0.4–3.1)

## 2020-06-10 LAB — IRON AND TIBC
Iron: 79 ug/dL (ref 42–163)
Saturation Ratios: 33 % (ref 20–55)
TIBC: 241 ug/dL (ref 202–409)
UIBC: 163 ug/dL (ref 117–376)

## 2020-06-10 LAB — LACTATE DEHYDROGENASE: LDH: 335 U/L — ABNORMAL HIGH (ref 98–192)

## 2020-06-10 LAB — TSH: TSH: 0.141 u[IU]/mL — ABNORMAL LOW (ref 0.320–4.118)

## 2020-06-10 MED ORDER — HYDROMORPHONE HCL 4 MG PO TABS
4.0000 mg | ORAL_TABLET | ORAL | 0 refills | Status: DC | PRN
  Filled 2020-06-10: qty 30, 5d supply, fill #0

## 2020-06-10 MED ORDER — LORAZEPAM 0.5 MG PO TABS
ORAL_TABLET | ORAL | 0 refills | Status: DC
  Filled 2020-06-10: qty 30, 8d supply, fill #0

## 2020-06-10 MED ORDER — SODIUM CHLORIDE 0.9 % IV SOLN
Freq: Once | INTRAVENOUS | Status: AC
  Filled 2020-06-10: qty 250

## 2020-06-10 MED ORDER — CYCLOBENZAPRINE HCL 10 MG PO TABS
10.0000 mg | ORAL_TABLET | Freq: Three times a day (TID) | ORAL | 1 refills | Status: DC | PRN
  Filled 2020-06-10: qty 90, 30d supply, fill #0

## 2020-06-10 NOTE — Progress Notes (Signed)
Hematology and Oncology Follow Up Visit  Brian [name] [id number] [birth year] 45 y.o. 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  He actually looks quite good.  He is heading down to [place] this week.  He has an appointment at [facility] to see about any other options for his Hurthle cell carcinoma.  He currently is on Gavreto.  He is tolerated this quite nicely.  He is gone for about a month now.  He has has had issues with blood pressure.  His pressure is on the high side right now.  He thinks this might be because he irritated his sciatic nerve.  He has had no vomiting.  He is a little bit nauseous.  He seems to be eating a little bit better.  He has had no issues with rashes.  There has been no increase in leg swelling.  He is always had a little bit of leg swelling.  He has had no headache.  Overall, his pain seems to be manageable.  He is on the Duragesic patch.  I think he is also taking hydromorphone.  His pain in the right upper quadrant of the abdomen seems to be doing better.  His main complaint of pain is with the sciatic nerve.  He has had no cough.  There is no shortness of breath.  He is just very disappointed that a [employer] let him go from his job.  He has been with [facility] for over 20 years.  His shame that this is what has  happened.  Overall, his performance status is ECOG 1.  ith this.  However, he then was found to have progressive disease.  I realize that he has been off Adriamycin for little bit because of the Covid.  However, I just do not feel that further Adriamycin is going to be beneficial.  Again his tumor is RET positive.  I am trying to get his insurance company to approve Harrell.  I think this would be a reasonable option for [place].  The dose will be 400 mg a day.  Hopefully, his insurance will approve this since our treatment options are very limited.  He is doing well on the Eliquis.  He is on Eliquis because of the pulmonary emboli and left popliteal DVT.  I have to say that at this time, his performance status is ECOG 1.    Medications:  Allergies as of 06/10/2020      Reactions   Dextromethorphan-guaifenesin Other (See Comments)   Irregular heartbeat   Doxycycline Anaphylaxis, Nausea And Vomiting, Rash, Other (See Comments)   "heart arrythmia" and "dyspepsia" (only oral doxycycline causes reaction)   Gadobutrol Hives, Other (See Comments)   Patient had MRI scan at [place]. Patient called one hour after he left imaging facility to report two "blisters"  that came up on "back" of lip.    Gadolinium Derivatives Hives   Patient had MRI scan at [place]. Patient called one hour after he left imaging facility to report two "blisters" that came up on "back" of lip.    Guaifenesin Palpitations      Ibuprofen Hives, Itching   Lisinopril Other (See Comments)   Angioedema   Pantoprazole Itching   Tramadol Hives, Itching   Barium Rash   Developed redness around neck after drinking 1st bottle of Barocat; pt was given Benedryl by ED Mds to be "on the safe side" before drinking 2nd bottle   Chlorhexidine Hives      Medication List       Accurate as of June 10, 2020  9:24 AM. If you have any questions, ask your nurse or doctor.        baclofen 10 MG tablet Commonly known as:  LIORESAL Take 1 tablet (10 mg total) by mouth 3 (three) times daily.   Banophen 25 mg capsule Generic drug: diphenhydrAMINE TAKE 1 CAPSULE BY MOUTH 1 HOUR BEFORE CT SCAN   cyclobenzaprine 10 MG tablet Commonly known as: FLEXERIL Take 1 tablet (10 mg total) by mouth 3 (three) times daily as needed.   dexamethasone 4 MG tablet Commonly known as: DECADRON TAKE 2 TABLETS (8 MG TOTAL) BY MOUTH DAILY. START THE DAY AFTER CHEMOTHERAPY FOR 2 DAYS.   dronabinol 5 MG capsule Commonly known as: MARINOL TAKE 1 CAPSULE (5 MG TOTAL) BY MOUTH 2 (TWO) TIMES DAILY BEFORE A MEAL.   Eliquis 5 MG Tabs tablet Generic drug: apixaban TAKE 1 TABLET (5 MG TOTAL) BY MOUTH 2 (TWO) TIMES DAILY.   enoxaparin 120 MG/0.8ML injection Commonly known as: LOVENOX INJECT 0.8 MLS (120 MG TOTAL) INTO THE SKIN EVERY 12 (TWELVE) HOURS.   EPINEPHrine 0.3 mg/0.3 mL Soaj injection Commonly known as: EpiPen 2-Pak USE AS DIRECTED FOR LIFE THREATENING ALLERGIC REACTIONS   famotidine 40 MG tablet Commonly known as: PEPCID Take 1 tablet (40 mg total) by mouth 2 (two) times daily.   furosemide 80 MG tablet Commonly known as: LASIX TAKE 1 TABLET (80 MG TOTAL) BY MOUTH DAILY.   Fusion Plus Caps Take 1 capsule by mouth daily.   gabapentin 300 MG capsule Commonly known as: NEURONTIN TAKE 1 CAPSULE BY MOUTH THREE TIMES DAILY   Gavreto 100 MG Caps Generic drug: Pralsetinib Take 400 mg by mouth daily.   HYDROmorphone 4 MG tablet Commonly known as: DILAUDID Take 1 tablet (4 mg total) by mouth every 4 (four) hours as needed for severe pain.   levothyroxine 200 MCG tablet Commonly known as: SYNTHROID Take one tablet (200 mcg dose) by mouth daily. Total:  288 mcg/day. What changed: Another medication with the same name was removed. Continue taking this medication, and follow the directions you see here. Changed by: Brian Napoleon, MD   levothyroxine 88 MCG tablet Commonly known as: SYNTHROID Take one tablet (88  mcg dose) by mouth daily. Total:  288 mcg/day. What changed: Another medication with the same name was removed. Continue taking this medication, and follow the directions you see here. Changed by: Brian Napoleon, MD   lidocaine-prilocaine cream Commonly known as: EMLA APPLY TO AFFECTED AREA ONCE   LORazepam 0.5 MG tablet Commonly known as: ATIVAN TAKE 1 TABLET BY MOUTH EVERY 6 HOURS AS NEEDED FOR NAUSEA & VOMITING   methylphenidate 10 MG tablet Commonly known as: RITALIN TAKE 1 TABLET (10 MG TOTAL) BY MOUTH 2 (  TWO) TIMES DAILY. What changed: when to take this   metoCLOPramide 10 MG tablet Commonly known as: REGLAN Take 1 tablet (10 mg total) by mouth every 6 (six) hours as needed for nausea or vomiting.   metolazone 5 MG tablet Commonly known as: ZAROXOLYN TAKE 1 TABLET (5 MG TOTAL) BY MOUTH DAILY. TAKE 1 HOUR BEFORE LASIX.   multivitamin capsule Take 1 capsule by mouth daily.   OLANZapine 10 MG tablet Commonly known as: ZYPREXA TAKE 1 TABLET (10 MG TOTAL) BY MOUTH AT BEDTIME.   ondansetron 8 MG tablet Commonly known as: ZOFRAN TAKE 1 TABLET (8 MG TOTAL) BY MOUTH 2 (TWO) TIMES DAILY AS NEEDED FOR REFRACTORY NAUSEA / VOMITING. START ON DAY 3 AFTER CHEMO.   oxyCODONE 20 mg 12 hr tablet Commonly known as: OXYCONTIN Take 1 tablet (20 mg total) by mouth every 12 (twelve) hours.   polyethylene glycol 17 g packet Commonly known as: MIRALAX / GLYCOLAX Take 17 g by mouth daily as needed for mild constipation.   potassium chloride 10 MEQ tablet Commonly known as: KLOR-CON TAKE 1 TABLET BY MOUTH ONCE DAILY   predniSONE 50 MG tablet Commonly known as: DELTASONE TAKE 1 TABLET BY MOUTH 13 HOURS BEFORE CT SCAN, 1 TABLET 7 HOURS BEFORE CT SCAN, AND 1 TABLET 1 HOUR BEFORE CT SCAN   prochlorperazine 10 MG tablet Commonly known as: COMPAZINE TAKE 1 TABLET (10 MG TOTAL) BY MOUTH EVERY 6 (SIX) HOURS AS NEEDED (NAUSEA OR VOMITING).   promethazine 25 MG tablet Commonly known as:  PHENERGAN TAKE 1 TABLET BY MOUTH EVERY 6 HOURS AS NEEDED FOR NAUSEA & VOMITING   senna-docusate 8.6-50 MG tablet Commonly known as: Senokot-S Take 1 tablet by mouth at bedtime as needed for mild constipation.       Allergies:  Allergies  Allergen Reactions  . Dextromethorphan-Guaifenesin Other (See Comments)    Irregular heartbeat   . Doxycycline Anaphylaxis, Nausea And Vomiting, Rash and Other (See Comments)    "heart arrythmia" and "dyspepsia" (only oral doxycycline causes reaction)  . Gadobutrol Hives and Other (See Comments)    Patient had MRI scan at [place]. Patient called one hour after he left imaging facility to report two "blisters" that came up on "back" of lip.    . Gadolinium Derivatives Hives    Patient had MRI scan at [place]. Patient called one hour after he left imaging facility to report two "blisters" that came up on "back" of lip.   . Guaifenesin Palpitations       . Ibuprofen Hives and Itching  . Lisinopril Other (See Comments)    Angioedema  . Pantoprazole Itching  . Tramadol Hives and Itching  . Barium Rash    Developed redness around neck after drinking 1st bottle of Barocat; pt was given Benedryl by ED Mds to be "on the safe side" before drinking 2nd bottle  . Chlorhexidine Hives    Past Medical History, Surgical history, Social history, and Family History were reviewed and updated.  Review of Systems: Review of Systems  Constitutional: Positive for malaise/fatigue.  HENT: Negative.   Eyes: Negative.   Respiratory: Negative.   Cardiovascular: Negative.   Gastrointestinal: Positive for abdominal pain, constipation, heartburn and nausea.  Genitourinary: Negative.   Musculoskeletal: Negative.   Skin: Negative.   Neurological: Negative.   Endo/Heme/Allergies: Negative.   Psychiatric/Behavioral: Negative.      Physical Exam:  weight is 284 lb (128.8 kg). His oral temperature is 98 F (36.7 C). His blood  pressure is  164/107 (abnormal) and his pulse is 110 (abnormal). His respiration is 19 and oxygen saturation is 98%.   Wt Readings from Last 3 Encounters:  06/10/20 284 lb (128.8 kg)  05/20/20 272 lb (123.4 kg)  05/01/20 272 lb 1.3 oz (123.4 kg)    Physical Exam Vitals reviewed.  HENT:     Head: Normocephalic and atraumatic.  Eyes:     Pupils: Pupils are equal, round, and reactive to light.  Cardiovascular:     Rate and Rhythm: Normal rate and regular rhythm.     Heart sounds: Normal heart sounds.  Pulmonary:     Effort: Pulmonary effort is normal.     Breath sounds: Normal breath sounds.  Abdominal:     General: Bowel sounds are normal.     Palpations: Abdomen is soft.  Musculoskeletal:        General: No tenderness or deformity. Normal range of motion.     Cervical back: Normal range of motion.  Lymphadenopathy:     Cervical: No cervical adenopathy.  Skin:    General: Skin is warm and dry.     Findings: No erythema or rash.  Neurological:     Mental Status: He is alert and oriented to person, place, and time.  Psychiatric:        Behavior: Behavior normal.        Thought Content: Thought content normal.        Judgment: Judgment normal.      Lab Results  Component Value Date   WBC 3.1 (L) 06/10/2020   HGB 12.3 (L) 06/10/2020   HCT 36.8 (L) 06/10/2020   MCV 86.2 06/10/2020   PLT 251 06/10/2020   Lab Results  Component Value Date   FERRITIN 2,533 (H) 05/20/2020   IRON 128 05/20/2020   TIBC 240 05/20/2020   UIBC 112 (L) 05/20/2020   IRONPCTSAT 53 05/20/2020   Lab Results  Component Value Date   RETICCTPCT 2.2 06/10/2020   RBC 4.27 06/10/2020   RBC 4.21 (L) 06/10/2020   No results found for: KPAFRELGTCHN, LAMBDASER, KAPLAMBRATIO No results found for: IGGSERUM, IGA, IGMSERUM No results found for: Odetta Pink, SPEI   Chemistry      Component Value Date/Time   NA 137 06/10/2020 0848   NA 141 02/06/2019 1210    K 4.0 06/10/2020 0848   CL 101 06/10/2020 0848   CO2 29 06/10/2020 0848   BUN 21 (H) 06/10/2020 0848   BUN 8 02/06/2019 1210   CREATININE 0.99 06/10/2020 0848      Component Value Date/Time   CALCIUM 9.3 06/10/2020 0848   ALKPHOS 113 06/10/2020 0848   AST 38 06/10/2020 0848   ALT 40 06/10/2020 0848   BILITOT 0.4 06/10/2020 0848       Impression and Plan: Brian [name] is a very pleasant 45 yo caucasian gentleman with an 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  He is ointment on the [place] for about a month.  It might be a little bit too early to know if this is working.  I would have to say that by the lab results, I would have to say that things should be pretty stable.  We will continue him on the [place].  We will have to see what [facility] has to say.  I am sure that they will provide a lot of input.  I know they are very good expert at dealing with  these unusual cancers.  I am just glad that his quality life seems to be doing okay with respect to the pain.  He did get some IV fluid today.  We will plan to get him back to see [place] in another month or so.  I am sure we will hear from [facility] about any options that they may feel are helpful or if he qualifies for a clinical trial.     Brian Napoleon, MD 4/25/20229:24 AM

## 2020-06-10 NOTE — Patient Instructions (Signed)
Implanted Port Insertion, Care After This sheet gives you information about how to care for yourself after your procedure. Your health care provider may also give you more specific instructions. If you have problems or questions, contact your health care provider. What can I expect after the procedure? After the procedure, it is common to have:  Discomfort at the port insertion site.  Bruising on the skin over the port. This should improve over 3-4 days. Follow these instructions at home: Port care  After your port is placed, you will get a manufacturer's information card. The card has information about your port. Keep this card with you at all times.  Take care of the port as told by your health care provider. Ask your health care provider if you or a family member can get training for taking care of the port at home. A home health care nurse may also take care of the port.  Make sure to remember what type of port you have. Incision care  Follow instructions from your health care provider about how to take care of your port insertion site. Make sure you: ? Wash your hands with soap and water before and after you change your bandage (dressing). If soap and water are not available, use hand sanitizer. ? Change your dressing as told by your health care provider. ? Leave stitches (sutures), skin glue, or adhesive strips in place. These skin closures may need to stay in place for 2 weeks or longer. If adhesive strip edges start to loosen and curl up, you may trim the loose edges. Do not remove adhesive strips completely unless your health care provider tells you to do that.  Check your port insertion site every day for signs of infection. Check for: ? Redness, swelling, or pain. ? Fluid or blood. ? Warmth. ? Pus or a bad smell.      Activity  Return to your normal activities as told by your health care provider. Ask your health care provider what activities are safe for you.  Do not  lift anything that is heavier than 10 lb (4.5 kg), or the limit that you are told, until your health care provider says that it is safe. General instructions  Take over-the-counter and prescription medicines only as told by your health care provider.  Do not take baths, swim, or use a hot tub until your health care provider approves. Ask your health care provider if you may take showers. You may only be allowed to take sponge baths.  Do not drive for 24 hours if you were given a sedative during your procedure.  Wear a medical alert bracelet in case of an emergency. This will tell any health care providers that you have a port.  Keep all follow-up visits as told by your health care provider. This is important. Contact a health care provider if:  You cannot flush your port with saline as directed, or you cannot draw blood from the port.  You have a fever or chills.  You have redness, swelling, or pain around your port insertion site.  You have fluid or blood coming from your port insertion site.  Your port insertion site feels warm to the touch.  You have pus or a bad smell coming from the port insertion site. Get help right away if:  You have chest pain or shortness of breath.  You have bleeding from your port that you cannot control. Summary  Take care of the port as told by your   health care provider. Keep the manufacturer's information card with you at all times.  Change your dressing as told by your health care provider.  Contact a health care provider if you have a fever or chills or if you have redness, swelling, or pain around your port insertion site.  Keep all follow-up visits as told by your health care provider. This information is not intended to replace advice given to you by your health care provider. Make sure you discuss any questions you have with your health care provider. Document Revised: 08/31/2017 Document Reviewed: 08/31/2017 Elsevier Patient Education   2021 [employer]vier Inc.  

## 2020-06-10 NOTE — Patient Instructions (Signed)
Dehydration, Adult Dehydration is condition in which there is not enough water or other fluids in the body. This happens when a person loses more fluids than he or she takes in. Important body parts cannot work right without the right amount of fluids. Any loss of fluids from the body can cause dehydration. Dehydration can be mild, worse, or very bad. It should be treated right away to keep it from getting very bad. What are the causes? This condition may be caused by:  Conditions that cause loss of water or other fluids, such as: ? Watery poop (diarrhea). ? Vomiting. ? Sweating a lot. ? Peeing (urinating) a lot.  Not drinking enough fluids, especially when you: ? Are ill. ? Are doing things that take a lot of energy to do.  Other illnesses and conditions, such as fever or infection.  Certain medicines, such as medicines that take extra fluid out of the body (diuretics).  Lack of safe drinking water.  Not being able to get enough water and food. What increases the risk? The following factors may make you more likely to develop this condition:  Having a long-term (chronic) illness that has not been treated the right way, such as: ? Diabetes. ? Heart disease. ? Kidney disease.  Being 65 years of age or older.  Having a disability.  Living in a place that is high above the ground or sea (high in altitude). The thinner, dried air causes more fluid loss.  Doing exercises that put stress on your body for a long time. What are the signs or symptoms? Symptoms of dehydration depend on how bad it is. Mild or worse dehydration  Thirst.  Dry lips or dry mouth.  Feeling dizzy or light-headed, especially when you stand up from sitting.  Muscle cramps.  Your body making: ? Dark pee (urine). Pee may be the color of tea. ? Less pee than normal. ? Less tears than normal.  Headache. Very bad dehydration  Changes in skin. Skin may: ? Be cold to the touch (clammy). ? Be blotchy  or pale. ? Not go back to normal right after you lightly pinch it and let it go.  Little or no tears, pee, or sweat.  Changes in vital signs, such as: ? Fast breathing. ? Low blood pressure. ? Weak pulse. ? Pulse that is more than 100 beats a minute when you are sitting still.  Other changes, such as: ? Feeling very thirsty. ? Eyes that look hollow (sunken). ? Cold hands and feet. ? Being mixed up (confused). ? Being very tired (lethargic) or having trouble waking from sleep. ? Short-term weight loss. ? Loss of consciousness. How is this treated? Treatment for this condition depends on how bad it is. Treatment should start right away. Do not wait until your condition gets very bad. Very bad dehydration is an emergency. You will need to go to a hospital.  Mild or worse dehydration can be treated at home. You may be asked to: ? Drink more fluids. ? Drink an oral rehydration solution (ORS). This drink helps get the right amounts of fluids and salts and minerals in the blood (electrolytes).  Very bad dehydration can be treated: ? With fluids through an IV tube. ? By getting normal levels of salts and minerals in your blood. This is often done by giving salts and minerals through a tube. The tube is passed through your nose and into your stomach. ? By treating the root cause. Follow these instructions at   home: Oral rehydration solution If told by your doctor, drink an ORS:  Make an ORS. Use instructions on the package.  Start by drinking small amounts, about  cup (120 mL) every 5-10 minutes.  Slowly drink more until you have had the amount that your doctor said to have. Eating and drinking  Drink enough clear fluid to keep your pee pale yellow. If you were told to drink an ORS, finish the ORS first. Then, start slowly drinking other clear fluids. Drink fluids such as: ? Water. Do not drink only water. Doing that can make the salt (sodium) level in your body get too low. ? Water  from ice chips you suck on. ? Fruit juice that you have added water to (diluted). ? Low-calorie sports drinks.  Eat foods that have the right amounts of salts and minerals, such as: ? Bananas. ? Oranges. ? Potatoes. ? Tomatoes. ? Spinach.  Do not drink alcohol.  Avoid: ? Drinks that have a lot of sugar. These include:  High-calorie sports drinks.  Fruit juice that you did not add water to.  Soda.  Caffeine. ? Foods that are greasy or have a lot of fat or sugar.         General instructions  Take over-the-counter and prescription medicines only as told by your doctor.  Do not take salt tablets. Doing that can make the salt level in your body get too high.  Return to your normal activities as told by your doctor. Ask your doctor what activities are safe for you.  Keep all follow-up visits as told by your doctor. This is important. Contact a doctor if:  You have pain in your belly (abdomen) and the pain: ? Gets worse. ? Stays in one place.  You have a rash.  You have a stiff neck.  You get angry or annoyed (irritable) more easily than normal.  You are more tired or have a harder time waking than normal.  You feel: ? Weak or dizzy. ? Very thirsty. Get help right away if you have:  Any symptoms of very bad dehydration.  Symptoms of vomiting, such as: ? You cannot eat or drink without vomiting. ? Your vomiting gets worse or does not go away. ? Your vomit has blood or green stuff in it.  Symptoms that get worse with treatment.  A fever.  A very bad headache.  Problems with peeing or pooping (having a bowel movement), such as: ? Watery poop that gets worse or does not go away. ? Blood in your poop (stool). This may cause poop to look black and tarry. ? Not peeing in 6-8 hours. ? Peeing only a small amount of very dark pee in 6-8 hours.  Trouble breathing. These symptoms may be an emergency. Do not wait to see if the symptoms will go away. Get  medical help right away. Call your local emergency services (911 in the [place] Do not drive yourself to the hospital. Summary  Dehydration is a condition in which there is not enough water or other fluids in the body. This happens when a person loses more fluids than he or she takes in.  Treatment for this condition depends on how bad it is. Treatment should be started right away. Do not wait until your condition gets very bad.  Drink enough clear fluid to keep your pee pale yellow. If you were told to drink an oral rehydration solution (ORS), finish the ORS first. Then, start slowly drinking other clear fluids.    Take over-the-counter and prescription medicines only as told by your doctor.  Get help right away if you have any symptoms of very bad dehydration. This information is not intended to replace advice given to you by your health care provider. Make sure you discuss any questions you have with your health care provider. Document Revised: 09/15/2018 Document Reviewed: 09/15/2018 Elsevier Patient Education  2021 Elsevier Inc.  

## 2020-06-11 ENCOUNTER — Other Ambulatory Visit (HOSPITAL_BASED_OUTPATIENT_CLINIC_OR_DEPARTMENT_OTHER): Payer: Self-pay

## 2020-06-11 ENCOUNTER — Telehealth: Payer: Self-pay

## 2020-06-11 NOTE — Telephone Encounter (Signed)
[name] and he is aware of his appts per 06/10/20 los  [employer]

## 2020-06-12 DIAGNOSIS — H52222 Regular astigmatism, left eye: Secondary | ICD-10-CM | POA: Diagnosis not present

## 2020-06-12 DIAGNOSIS — H5203 Hypermetropia, bilateral: Secondary | ICD-10-CM | POA: Diagnosis not present

## 2020-06-12 DIAGNOSIS — H524 Presbyopia: Secondary | ICD-10-CM | POA: Diagnosis not present

## 2020-06-13 DIAGNOSIS — Z79899 Other long term (current) drug therapy: Secondary | ICD-10-CM | POA: Insufficient documentation

## 2020-06-13 DIAGNOSIS — C787 Secondary malignant neoplasm of liver and intrahepatic bile duct: Secondary | ICD-10-CM | POA: Diagnosis not present

## 2020-06-13 DIAGNOSIS — C73 Malignant neoplasm of thyroid gland: Secondary | ICD-10-CM | POA: Diagnosis not present

## 2020-06-13 DIAGNOSIS — E890 Postprocedural hypothyroidism: Secondary | ICD-10-CM | POA: Diagnosis not present

## 2020-06-14 ENCOUNTER — Encounter: Payer: Self-pay | Admitting: *Deleted

## 2020-06-14 DIAGNOSIS — C73 Malignant neoplasm of thyroid gland: Secondary | ICD-10-CM | POA: Diagnosis not present

## 2020-06-14 DIAGNOSIS — C787 Secondary malignant neoplasm of liver and intrahepatic bile duct: Secondary | ICD-10-CM | POA: Diagnosis not present

## 2020-06-14 DIAGNOSIS — Z79899 Other long term (current) drug therapy: Secondary | ICD-10-CM | POA: Diagnosis not present

## 2020-06-14 DIAGNOSIS — Z7901 Long term (current) use of anticoagulants: Secondary | ICD-10-CM | POA: Diagnosis not present

## 2020-06-14 DIAGNOSIS — C770 Secondary and unspecified malignant neoplasm of lymph nodes of head, face and neck: Secondary | ICD-10-CM | POA: Diagnosis not present

## 2020-06-14 DIAGNOSIS — E890 Postprocedural hypothyroidism: Secondary | ICD-10-CM | POA: Diagnosis not present

## 2020-06-14 DIAGNOSIS — R221 Localized swelling, mass and lump, neck: Secondary | ICD-10-CM | POA: Diagnosis not present

## 2020-06-14 DIAGNOSIS — Z01818 Encounter for other preprocedural examination: Secondary | ICD-10-CM | POA: Diagnosis not present

## 2020-06-14 DIAGNOSIS — R911 Solitary pulmonary nodule: Secondary | ICD-10-CM | POA: Diagnosis not present

## 2020-06-14 DIAGNOSIS — Z79890 Hormone replacement therapy (postmenopausal): Secondary | ICD-10-CM | POA: Diagnosis not present

## 2020-06-14 DIAGNOSIS — R590 Localized enlarged lymph nodes: Secondary | ICD-10-CM | POA: Diagnosis not present

## 2020-06-14 DIAGNOSIS — C7989 Secondary malignant neoplasm of other specified sites: Secondary | ICD-10-CM | POA: Diagnosis not present

## 2020-06-15 DIAGNOSIS — C73 Malignant neoplasm of thyroid gland: Secondary | ICD-10-CM | POA: Diagnosis not present

## 2020-06-17 ENCOUNTER — Other Ambulatory Visit: Payer: Self-pay | Admitting: Hematology & Oncology

## 2020-06-17 ENCOUNTER — Other Ambulatory Visit (HOSPITAL_BASED_OUTPATIENT_CLINIC_OR_DEPARTMENT_OTHER): Payer: Self-pay

## 2020-06-17 MED ORDER — PROMETHAZINE HCL 25 MG PO TABS
ORAL_TABLET | ORAL | 0 refills | Status: DC
  Filled 2020-06-17: qty 120, 30d supply, fill #0

## 2020-06-17 MED ORDER — ONDANSETRON HCL 8 MG PO TABS
ORAL_TABLET | ORAL | 1 refills | Status: DC
  Filled 2020-06-17: qty 30, 15d supply, fill #0

## 2020-06-17 MED ORDER — DRONABINOL 5 MG PO CAPS
ORAL_CAPSULE | ORAL | 0 refills | Status: AC
  Filled 2020-06-17: qty 60, 30d supply, fill #0

## 2020-06-17 MED ORDER — PROCHLORPERAZINE MALEATE 10 MG PO TABS
ORAL_TABLET | ORAL | 1 refills | Status: DC
  Filled 2020-06-17: qty 30, 8d supply, fill #0

## 2020-06-17 MED FILL — Apixaban Tab 5 MG: ORAL | 30 days supply | Qty: 60 | Fill #0 | Status: AC

## 2020-06-17 MED FILL — Gabapentin Cap 300 MG: ORAL | 30 days supply | Qty: 90 | Fill #0 | Status: AC

## 2020-06-20 ENCOUNTER — Encounter: Payer: Self-pay | Admitting: Hematology & Oncology

## 2020-06-20 DIAGNOSIS — M898X8 Other specified disorders of bone, other site: Secondary | ICD-10-CM | POA: Diagnosis not present

## 2020-06-20 DIAGNOSIS — R911 Solitary pulmonary nodule: Secondary | ICD-10-CM | POA: Diagnosis not present

## 2020-06-20 DIAGNOSIS — C787 Secondary malignant neoplasm of liver and intrahepatic bile duct: Secondary | ICD-10-CM | POA: Diagnosis not present

## 2020-06-20 DIAGNOSIS — C73 Malignant neoplasm of thyroid gland: Secondary | ICD-10-CM | POA: Diagnosis not present

## 2020-06-21 ENCOUNTER — Other Ambulatory Visit: Payer: Self-pay | Admitting: Hematology & Oncology

## 2020-06-24 ENCOUNTER — Other Ambulatory Visit: Payer: Self-pay | Admitting: *Deleted

## 2020-06-24 ENCOUNTER — Telehealth: Payer: Self-pay | Admitting: *Deleted

## 2020-06-24 ENCOUNTER — Other Ambulatory Visit (HOSPITAL_BASED_OUTPATIENT_CLINIC_OR_DEPARTMENT_OTHER): Payer: Self-pay

## 2020-06-24 DIAGNOSIS — C73 Malignant neoplasm of thyroid gland: Secondary | ICD-10-CM

## 2020-06-24 DIAGNOSIS — C799 Secondary malignant neoplasm of unspecified site: Secondary | ICD-10-CM

## 2020-06-24 MED ORDER — LORAZEPAM 1 MG PO TABS
1.0000 mg | ORAL_TABLET | Freq: Four times a day (QID) | ORAL | 0 refills | Status: DC | PRN
  Filled 2020-06-24: qty 30, 8d supply, fill #0

## 2020-06-24 MED ORDER — HYDROMORPHONE HCL 4 MG PO TABS
ORAL_TABLET | ORAL | 0 refills | Status: DC
  Filled 2020-06-24: qty 60, 5d supply, fill #0

## 2020-06-24 MED ORDER — OXYCODONE HCL ER 30 MG PO T12A
30.0000 mg | EXTENDED_RELEASE_TABLET | Freq: Three times a day (TID) | ORAL | 0 refills | Status: DC
  Filled 2020-06-24: qty 90, 30d supply, fill #0

## 2020-06-24 NOTE — Telephone Encounter (Signed)
Message received from patient wanting to know if Dr. Marin Olp has received notes from [facility] regarding need for [facility] and XRT to hip.  Call placed back to patient and patient notified that no notes have been received via fax, but notes from [facility] are able to be viewed from [place].  Pt notified that Dr. Marin Olp has placed order for [facility], has spoken with Dr. Sondra Come regarding XRT to hip and that we are awaiting PA for [facility].  Pt is appreciative of assistance and is requesting refills for Dilaudid, Oxycontin and Ativan. Dr. Marin Olp notified.

## 2020-06-26 ENCOUNTER — Inpatient Hospital Stay: Payer: 59

## 2020-06-26 ENCOUNTER — Other Ambulatory Visit: Payer: Self-pay

## 2020-06-26 ENCOUNTER — Encounter: Payer: Self-pay | Admitting: Radiation Oncology

## 2020-06-26 ENCOUNTER — Encounter: Payer: Self-pay | Admitting: Hematology & Oncology

## 2020-06-26 ENCOUNTER — Other Ambulatory Visit: Payer: Self-pay | Admitting: *Deleted

## 2020-06-26 ENCOUNTER — Inpatient Hospital Stay (HOSPITAL_BASED_OUTPATIENT_CLINIC_OR_DEPARTMENT_OTHER): Payer: 59 | Admitting: Hematology & Oncology

## 2020-06-26 ENCOUNTER — Other Ambulatory Visit (HOSPITAL_BASED_OUTPATIENT_CLINIC_OR_DEPARTMENT_OTHER): Payer: Self-pay

## 2020-06-26 VITALS — BP 115/81 | HR 128 | Temp 98.3°F | Resp 17 | Wt 281.0 lb

## 2020-06-26 DIAGNOSIS — C73 Malignant neoplasm of thyroid gland: Secondary | ICD-10-CM | POA: Insufficient documentation

## 2020-06-26 DIAGNOSIS — R5383 Other fatigue: Secondary | ICD-10-CM | POA: Insufficient documentation

## 2020-06-26 DIAGNOSIS — C787 Secondary malignant neoplasm of liver and intrahepatic bile duct: Secondary | ICD-10-CM

## 2020-06-26 DIAGNOSIS — C799 Secondary malignant neoplasm of unspecified site: Secondary | ICD-10-CM | POA: Diagnosis not present

## 2020-06-26 DIAGNOSIS — Z5112 Encounter for antineoplastic immunotherapy: Secondary | ICD-10-CM | POA: Insufficient documentation

## 2020-06-26 LAB — CBC WITH DIFFERENTIAL (CANCER CENTER ONLY)
Abs Immature Granulocytes: 0.01 10*3/uL (ref 0.00–0.07)
Basophils Absolute: 0 10*3/uL (ref 0.0–0.1)
Basophils Relative: 0 %
Eosinophils Absolute: 0 10*3/uL (ref 0.0–0.5)
Eosinophils Relative: 0 %
HCT: 32.5 % — ABNORMAL LOW (ref 39.0–52.0)
Hemoglobin: 11 g/dL — ABNORMAL LOW (ref 13.0–17.0)
Immature Granulocytes: 0 %
Lymphocytes Relative: 7 %
Lymphs Abs: 0.2 10*3/uL — ABNORMAL LOW (ref 0.7–4.0)
MCH: 28.9 pg (ref 26.0–34.0)
MCHC: 33.8 g/dL (ref 30.0–36.0)
MCV: 85.3 fL (ref 80.0–100.0)
Monocytes Absolute: 0.3 10*3/uL (ref 0.1–1.0)
Monocytes Relative: 11 %
Neutro Abs: 2.3 10*3/uL (ref 1.7–7.7)
Neutrophils Relative %: 82 %
Platelet Count: 219 10*3/uL (ref 150–400)
RBC: 3.81 MIL/uL — ABNORMAL LOW (ref 4.22–5.81)
RDW: 17.2 % — ABNORMAL HIGH (ref 11.5–15.5)
WBC Count: 2.8 10*3/uL — ABNORMAL LOW (ref 4.0–10.5)
nRBC: 0 % (ref 0.0–0.2)

## 2020-06-26 LAB — COMPREHENSIVE METABOLIC PANEL
ALT: 21 U/L (ref 0–44)
AST: 26 U/L (ref 15–41)
Albumin: 3.9 g/dL (ref 3.5–5.0)
Alkaline Phosphatase: 105 U/L (ref 38–126)
Anion gap: 7 (ref 5–15)
BUN: 23 mg/dL — ABNORMAL HIGH (ref 6–20)
CO2: 32 mmol/L (ref 22–32)
Calcium: 9.7 mg/dL (ref 8.9–10.3)
Chloride: 96 mmol/L — ABNORMAL LOW (ref 98–111)
Creatinine, Ser: 0.89 mg/dL (ref 0.61–1.24)
GFR, Estimated: >60 mL/min (ref >60)
Glucose, Bld: 183 mg/dL — ABNORMAL HIGH (ref 70–99)
Potassium: 3.8 mmol/L (ref 3.5–5.1)
Sodium: 135 mmol/L (ref 135–145)
Total Bilirubin: 0.8 mg/dL (ref 0.3–1.2)
Total Protein: 6.2 g/dL — ABNORMAL LOW (ref 6.5–8.1)

## 2020-06-26 MED ORDER — SODIUM CHLORIDE 0.9 % IV SOLN
200.0000 mg | Freq: Once | INTRAVENOUS | Status: AC
  Administered 2020-06-26: 200 mg via INTRAVENOUS
  Filled 2020-06-26: qty 8

## 2020-06-26 MED ORDER — PROCHLORPERAZINE MALEATE 10 MG PO TABS
10.0000 mg | ORAL_TABLET | Freq: Four times a day (QID) | ORAL | 1 refills | Status: DC | PRN
  Filled 2020-06-26: qty 30, 8d supply, fill #0
  Filled 2020-10-07: qty 30, 8d supply, fill #1

## 2020-06-26 MED ORDER — LORAZEPAM 0.5 MG PO TABS: 0.5000 mg | ORAL_TABLET | Freq: Four times a day (QID) | ORAL | 0 refills | Status: DC | PRN

## 2020-06-26 MED ORDER — SODIUM CHLORIDE 0.9 % IV SOLN
Freq: Once | INTRAVENOUS | Status: AC
  Filled 2020-06-26: qty 250

## 2020-06-26 MED ORDER — SODIUM CHLORIDE 0.9% FLUSH
10.0000 mL | INTRAVENOUS | Status: DC | PRN
  Administered 2020-06-26: 10 mL
  Filled 2020-06-26: qty 10

## 2020-06-26 MED ORDER — HEPARIN SOD (PORK) LOCK FLUSH 100 UNIT/ML IV SOLN
500.0000 [IU] | Freq: Once | INTRAVENOUS | Status: AC | PRN
  Administered 2020-06-26: 500 [IU]
  Filled 2020-06-26: qty 5

## 2020-06-26 MED ORDER — METHYLPHENIDATE HCL 10 MG PO TABS
10.0000 mg | ORAL_TABLET | Freq: Two times a day (BID) | ORAL | 0 refills | Status: DC
  Filled 2020-06-26: qty 60, 30d supply, fill #0

## 2020-06-26 MED ORDER — LIDOCAINE-PRILOCAINE 2.5-2.5 % EX CREA
TOPICAL_CREAM | CUTANEOUS | 3 refills | Status: DC
  Filled 2020-06-26: qty 30, 10d supply, fill #0

## 2020-06-26 MED ORDER — LORAZEPAM 0.5 MG PO TABS
ORAL_TABLET | ORAL | 0 refills | Status: DC
  Filled 2020-06-26: qty 30, 8d supply, fill #0

## 2020-06-26 MED ORDER — ONDANSETRON HCL 8 MG PO TABS
8.0000 mg | ORAL_TABLET | Freq: Two times a day (BID) | ORAL | 1 refills | Status: DC | PRN
  Filled 2020-06-26: qty 30, 15d supply, fill #0

## 2020-06-26 NOTE — Patient Instructions (Signed)
Implanted Port Insertion, Care After This sheet gives you information about how to care for yourself after your procedure. Your health care provider may also give you more specific instructions. If you have problems or questions, contact your health care provider. What can I expect after the procedure? After the procedure, it is common to have:  Discomfort at the port insertion site.  Bruising on the skin over the port. This should improve over 3-4 days. Follow these instructions at home: Port care  After your port is placed, you will get a manufacturer's information card. The card has information about your port. Keep this card with you at all times.  Take care of the port as told by your health care provider. Ask your health care provider if you or a family member can get training for taking care of the port at home. A home health care nurse may also take care of the port.  Make sure to remember what type of port you have. Incision care  Follow instructions from your health care provider about how to take care of your port insertion site. Make sure you: ? Wash your hands with soap and water before and after you change your bandage (dressing). If soap and water are not available, use hand sanitizer. ? Change your dressing as told by your health care provider. ? Leave stitches (sutures), skin glue, or adhesive strips in place. These skin closures may need to stay in place for 2 weeks or longer. If adhesive strip edges start to loosen and curl up, you may trim the loose edges. Do not remove adhesive strips completely unless your health care provider tells you to do that.  Check your port insertion site every day for signs of infection. Check for: ? Redness, swelling, or pain. ? Fluid or blood. ? Warmth. ? Pus or a bad smell.      Activity  Return to your normal activities as told by your health care provider. Ask your health care provider what activities are safe for you.  Do not  lift anything that is heavier than 10 lb (4.5 kg), or the limit that you are told, until your health care provider says that it is safe. General instructions  Take over-the-counter and prescription medicines only as told by your health care provider.  Do not take baths, swim, or use a hot tub until your health care provider approves. Ask your health care provider if you may take showers. You may only be allowed to take sponge baths.  Do not drive for 24 hours if you were given a sedative during your procedure.  Wear a medical alert bracelet in case of an emergency. This will tell any health care providers that you have a port.  Keep all follow-up visits as told by your health care provider. This is important. Contact a health care provider if:  You cannot flush your port with saline as directed, or you cannot draw blood from the port.  You have a fever or chills.  You have redness, swelling, or pain around your port insertion site.  You have fluid or blood coming from your port insertion site.  Your port insertion site feels warm to the touch.  You have pus or a bad smell coming from the port insertion site. Get help right away if:  You have chest pain or shortness of breath.  You have bleeding from your port that you cannot control. Summary  Take care of the port as told by your   health care provider. Keep the manufacturer's information card with you at all times.  Change your dressing as told by your health care provider.  Contact a health care provider if you have a fever or chills or if you have redness, swelling, or pain around your port insertion site.  Keep all follow-up visits as told by your health care provider. This information is not intended to replace advice given to you by your health care provider. Make sure you discuss any questions you have with your health care provider. Document Revised: 08/31/2017 Document Reviewed: 08/31/2017 Elsevier Patient Education   2021 [employer]vier Inc.  

## 2020-06-26 NOTE — Progress Notes (Signed)
PA okay for 1 dose of Keytruda on 06/26/20 per Otilio Carpen, Financial Advocate.

## 2020-06-26 NOTE — Progress Notes (Signed)
Hematology and Oncology Follow Up Visit  Brian [name] [id number] 08/24/1975 45 y.o. 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  DVT left popliteal vein   Past Therapy: Cabometyx 40 mg po q day -- start on 04/17/2019 - d/c on 07/17/2019 for progression Lenvima 24 mg po q day -- on hold due to proteinuria Taxotere/CDDP -- s/p cycle 4- started on 09/13/2019  Current Therapy: Gavreto 400 mg po q day -- started on 05/17/2020-- d/c on 06/16/2020 Adriamycin weekly 2 weeks on 1 week off - s/p cycle8 --d/c on 04/16/2020 due to progression XRT for LEFT hip met Pembrolizumab 200 mg IV q 3 week -- start on 06/26/2020 Xgeva 120 mg IM q 3 months -- start on 07/17/2020 Eliquis 5 mg po BID   Interim History:  Brian [name] is here today for follow-up.  He did meet down to [facility] in [place].  He had a very good meeting down there.  It was felt that he might benefit from immunotherapy.  We will go ahead and get him started on pembrolizumab.  He had a full work-up done in [place].  He had scans done.  He does have a new left hip met.  We will see if radiation therapy can take care of this.  I spoke with Dr. Sondra Come of Radiation Oncology about this.  He will get Brian [name] for therapy.  Brian [name] is currently on Gavreto.  We will go ahead and stop this.  He has had no problems with nausea or vomiting.  His abdominal pain seems to be doing a little bit better.  He has had no diarrhea.  Is a little bit of constipation.  There is been no problems with rashes.  He had little bit of leg swelling which has been chronic.  He continues on Eliquis.  I probably would keep him on Eliquis since he has a malignancy associated thromboembolic disease.  Currently, I would have to say his performance status is ECOG 1.   Medications:  Allergies as of 06/26/2020      Reactions    Dextromethorphan-guaifenesin Other (See Comments)   Irregular heartbeat   Doxycycline Anaphylaxis, Nausea And Vomiting, Rash, Other (See Comments)   "heart arrythmia" and "dyspepsia" (only oral doxycycline causes reaction)   Gadobutrol Hives, Other (See Comments)   Patient had MRI scan at [place]. Patient called one hour after he left imaging facility to report two "blisters" that came up on "back" of lip.    Gadolinium Derivatives Hives   Patient had MRI scan at [place]. Patient called one hour after he left imaging facility to report two "blisters" that came up on "back" of lip.    Guaifenesin Palpitations      Ibuprofen Hives, Itching   Lisinopril Other (See Comments)   Angioedema   Pantoprazole Itching   Tramadol Hives, Itching   Barium Rash   Developed redness around neck after drinking 1st bottle of Barocat; pt was given Benedryl by ED Mds to be "on the safe side" before drinking 2nd bottle   Chlorhexidine Hives      Medication List       Accurate as of Jun 26, 2020  4:13 PM. If you have any questions, ask your nurse or doctor.        baclofen 10 MG tablet Commonly known as: LIORESAL Take 1 tablet (10 mg  total) by mouth 3 (three) times daily.   Banophen 25 mg capsule Generic drug: diphenhydrAMINE TAKE 1 CAPSULE BY MOUTH 1 HOUR BEFORE CT SCAN   cyclobenzaprine 10 MG tablet Commonly known as: FLEXERIL Take 1 tablet (10 mg total) by mouth 3 (three) times daily as needed.   dexamethasone 4 MG tablet Commonly known as: DECADRON TAKE 2 TABLETS (8 MG TOTAL) BY MOUTH DAILY. START THE DAY AFTER CHEMOTHERAPY FOR 2 DAYS.   dronabinol 5 MG capsule Commonly known as: MARINOL TAKE 1 CAPSULE (5 MG TOTAL) BY MOUTH 2 (TWO) TIMES DAILY BEFORE A MEAL.   Eliquis 5 MG Tabs tablet Generic drug: apixaban TAKE 1 TABLET (5 MG TOTAL) BY MOUTH 2 (TWO) TIMES DAILY.   enoxaparin 120 MG/0.8ML injection Commonly known as: LOVENOX INJECT 0.8 MLS (120 MG TOTAL) INTO THE  SKIN EVERY 12 (TWELVE) HOURS.   EPINEPHrine 0.3 mg/0.3 mL Soaj injection Commonly known as: EpiPen 2-Pak USE AS DIRECTED FOR LIFE THREATENING ALLERGIC REACTIONS   famotidine 40 MG tablet Commonly known as: PEPCID Take 1 tablet (40 mg total) by mouth 2 (two) times daily.   furosemide 80 MG tablet Commonly known as: LASIX TAKE 1 TABLET (80 MG TOTAL) BY MOUTH DAILY.   Fusion Plus Caps Take 1 capsule by mouth daily.   gabapentin 300 MG capsule Commonly known as: NEURONTIN TAKE 1 CAPSULE BY MOUTH THREE TIMES DAILY   Gavreto 100 MG Caps Generic drug: Pralsetinib Take 400 mg by mouth daily.   HYDROmorphone 4 MG tablet Commonly known as: DILAUDID Take one to two tablets by mouth every four hours as needed for pain   levothyroxine 200 MCG tablet Commonly known as: SYNTHROID Take one tablet (200 mcg dose) by mouth daily. Total:  288 mcg/day.   levothyroxine 88 MCG tablet Commonly known as: SYNTHROID Take one tablet (88 mcg dose) by mouth daily. Total:  288 mcg/day.   lidocaine-prilocaine cream Commonly known as: EMLA APPLY TO AFFECTED AREA ONCE What changed: Another medication with the same name was added. Make sure you understand how and when to take each.   lidocaine-prilocaine cream Commonly known as: EMLA Apply to affected area once What changed: You were already taking a medication with the same name, and this prescription was added. Make sure you understand how and when to take each.   LORazepam 0.5 MG tablet Commonly known as: ATIVAN TAKE 1 TABLET BY MOUTH EVERY 6 HOURS AS NEEDED FOR NAUSEA & VOMITING What changed: Another medication with the same name was added. Make sure you understand how and when to take each.   LORazepam 1 MG tablet Commonly known as: ATIVAN Take 1 tablet (1 mg total) by mouth every 6 (six) hours as needed for anxiety. What changed: Another medication with the same name was added. Make sure you understand how and when to take each.    LORazepam 0.5 MG tablet Commonly known as: Ativan Take 1 tablet (0.5 mg total) by mouth every 6 (six) hours as needed (Nausea or vomiting). What changed: You were already taking a medication with the same name, and this prescription was added. Make sure you understand how and when to take each.   LORazepam 0.5 MG tablet Commonly known as: ATIVAN take 1 tablet by mouth every 6 hours as needed for nausea or vomiting What changed: You were already taking a medication with the same name, and this prescription was added. Make sure you understand how and when to take each.   losartan 25 MG tablet Commonly  known as: COZAAR Take 25 mg by mouth as needed.   methylphenidate 10 MG tablet Commonly known as: RITALIN Take 1 tablet (10 mg total) by mouth 2 (two) times daily. What changed: when to take this   metoCLOPramide 10 MG tablet Commonly known as: REGLAN Take 1 tablet (10 mg total) by mouth every 6 (six) hours as needed for nausea or vomiting.   metolazone 5 MG tablet Commonly known as: ZAROXOLYN TAKE 1 TABLET (5 MG TOTAL) BY MOUTH DAILY. TAKE 1 HOUR BEFORE LASIX.   multivitamin capsule Take 1 capsule by mouth daily.   OLANZapine 10 MG tablet Commonly known as: ZYPREXA TAKE 1 TABLET (10 MG TOTAL) BY MOUTH AT BEDTIME.   ondansetron 8 MG tablet Commonly known as: ZOFRAN TAKE 1 TABLET (8 MG TOTAL) BY MOUTH 2 (TWO) TIMES DAILY AS NEEDED FOR REFRACTORY NAUSEA / VOMITING. START ON DAY 3 AFTER CHEMO. What changed: Another medication with the same name was added. Make sure you understand how and when to take each.   ondansetron 8 MG tablet Commonly known as: Zofran Take 1 tablet (8 mg total) by mouth 2 (two) times daily as needed (Nausea or vomiting). What changed: You were already taking a medication with the same name, and this prescription was added. Make sure you understand how and when to take each.   OxyCONTIN 30 MG 12 hr tablet Generic drug: oxyCODONE Take 1 tablet (30 mg  total) by mouth every 8 (eight) hours.   polyethylene glycol 17 g packet Commonly known as: MIRALAX / GLYCOLAX Take 17 g by mouth daily as needed for mild constipation.   potassium chloride 10 MEQ tablet Commonly known as: KLOR-CON TAKE 1 TABLET BY MOUTH ONCE DAILY   predniSONE 50 MG tablet Commonly known as: DELTASONE TAKE 1 TABLET BY MOUTH 13 HOURS BEFORE CT SCAN, 1 TABLET 7 HOURS BEFORE CT SCAN, AND 1 TABLET 1 HOUR BEFORE CT SCAN   prochlorperazine 10 MG tablet Commonly known as: COMPAZINE TAKE 1 TABLET (10 MG TOTAL) BY MOUTH EVERY 6 (SIX) HOURS AS NEEDED (NAUSEA OR VOMITING). What changed: Another medication with the same name was added. Make sure you understand how and when to take each.   prochlorperazine 10 MG tablet Commonly known as: COMPAZINE Take 1 tablet (10 mg total) by mouth every 6 (six) hours as needed (Nausea or vomiting). What changed: You were already taking a medication with the same name, and this prescription was added. Make sure you understand how and when to take each.   promethazine 25 MG tablet Commonly known as: PHENERGAN TAKE 1 TABLET BY MOUTH EVERY 6 HOURS AS NEEDED FOR NAUSEA & VOMITING   senna-docusate 8.6-50 MG tablet Commonly known as: Senokot-S Take 1 tablet by mouth at bedtime as needed for mild constipation.       Allergies:  Allergies  Allergen Reactions  . Dextromethorphan-Guaifenesin Other (See Comments)    Irregular heartbeat   . Doxycycline Anaphylaxis, Nausea And Vomiting, Rash and Other (See Comments)    "heart arrythmia" and "dyspepsia" (only oral doxycycline causes reaction)  . Gadobutrol Hives and Other (See Comments)    Patient had MRI scan at [place]. Patient called one hour after he left imaging facility to report two "blisters" that came up on "back" of lip.    . Gadolinium Derivatives Hives    Patient had MRI scan at [place]. Patient called one hour after he left imaging facility to report two  "blisters" that came up on "back" of  lip.   . Guaifenesin Palpitations       . Ibuprofen Hives and Itching  . Lisinopril Other (See Comments)    Angioedema  . Pantoprazole Itching  . Tramadol Hives and Itching  . Barium Rash    Developed redness around neck after drinking 1st bottle of Barocat; pt was given Benedryl by ED Mds to be "on the safe side" before drinking 2nd bottle  . Chlorhexidine Hives    Past Medical History, Surgical history, Social history, and Family History were reviewed and updated.  Review of Systems: Review of Systems  Constitutional: Positive for malaise/fatigue.  HENT: Negative.   Eyes: Negative.   Respiratory: Negative.   Cardiovascular: Negative.   Gastrointestinal: Positive for abdominal pain, constipation, heartburn and nausea.  Genitourinary: Negative.   Musculoskeletal: Positive for joint pain.  Skin: Negative.   Neurological: Negative.   Endo/Heme/Allergies: Negative.   Psychiatric/Behavioral: Negative.      Physical Exam:  weight is 281 lb (127.5 kg). His oral temperature is 98.3 F (36.8 C). His blood pressure is 115/81 and his pulse is 128 (abnormal). His respiration is 17 and oxygen saturation is 97%.   Wt Readings from Last 3 Encounters:  06/26/20 281 lb (127.5 kg)  06/10/20 284 lb (128.8 kg)  05/20/20 272 lb (123.4 kg)    Physical Exam Vitals reviewed.  HENT:     Head: Normocephalic and atraumatic.  Eyes:     Pupils: Pupils are equal, round, and reactive to light.  Cardiovascular:     Rate and Rhythm: Normal rate and regular rhythm.     Heart sounds: Normal heart sounds.  Pulmonary:     Effort: Pulmonary effort is normal.     Breath sounds: Normal breath sounds.  Abdominal:     General: Bowel sounds are normal.     Palpations: Abdomen is soft.  Musculoskeletal:        General: No tenderness or deformity. Normal range of motion.     Cervical back: Normal range of motion.  Lymphadenopathy:     Cervical: No cervical  adenopathy.  Skin:    General: Skin is warm and dry.     Findings: No erythema or rash.  Neurological:     Mental Status: He is alert and oriented to person, place, and time.  Psychiatric:        Behavior: Behavior normal.        Thought Content: Thought content normal.        Judgment: Judgment normal.      Lab Results  Component Value Date   WBC 2.8 (L) 06/26/2020   HGB 11.0 (L) 06/26/2020   HCT 32.5 (L) 06/26/2020   MCV 85.3 06/26/2020   PLT 219 06/26/2020   Lab Results  Component Value Date   FERRITIN 1,299 (H) 06/10/2020   IRON 79 06/10/2020   TIBC 241 06/10/2020   UIBC 163 06/10/2020   IRONPCTSAT 33 06/10/2020   Lab Results  Component Value Date   RETICCTPCT 2.2 06/10/2020   RBC 3.81 (L) 06/26/2020   No results found for: KPAFRELGTCHN, LAMBDASER, KAPLAMBRATIO No results found for: IGGSERUM, IGA, IGMSERUM No results found for: TOTALPROTELP, ALBUMINELP, A1GS, A2GS, BETS, BETA2SER, GAMS, MSPIKE, SPEI   Chemistry      Component Value Date/Time   NA 135 06/26/2020 1330   NA 141 02/06/2019 1210   K 3.8 06/26/2020 1330   CL 96 (L) 06/26/2020 1330   CO2 32 06/26/2020 1330   BUN 23 (H) 06/26/2020 1330  BUN 8 02/06/2019 1210   CREATININE 0.89 06/26/2020 1330   CREATININE 0.99 06/10/2020 0848      Component Value Date/Time   CALCIUM 9.7 06/26/2020 1330   ALKPHOS 105 06/26/2020 1330   AST 26 06/26/2020 1330   AST 38 06/10/2020 0848   ALT 21 06/26/2020 1330   ALT 40 06/10/2020 0848   BILITOT 0.8 06/26/2020 1330   BILITOT 0.4 06/10/2020 0848       Impression and Plan: Brian [name] is a very pleasant 45 yo caucasian gentleman with an 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  We will now try him on immunotherapy.  We will see how this might help with this tumor.  Hopefully, we can see response.  We will use pembrolizumab.  We will treat him every 3 weeks.  He will have radiation therapy for the left hip metastasis.  I will also  have him on Xgeva every 3 months.  Overall, he seems to be in pretty decent shape.  Hopefully we will see that he response to immunotherapy.  I will have him come back to see me in another 3 weeks.    [name], MD 5/11/20224:13 PM

## 2020-06-26 NOTE — Progress Notes (Signed)
  Radiation Oncology         [phone] ________________________________  Name: Brian [name] MRN: [id number]  Date: 06/26/2020  DOB: 1975-10-05  Chart Note:  I received a phone call from Dr. Burney Gauze regarding this patient.  He had a PET scan done at [facility] on April 29.  This study showed a lytic lesion involving the left ilium with soft tissue extension with a maximum SUV of 36.3.  Dr. Marin Olp has asked [place] to evaluate the patient for possible palliative radiation treatment.  I will work to schedule a follow-up visit and CT simulation.  Radiographic Findings: PET CT was done on 06/14/20: Musculoskeletal: FDG avid lytic lesion in the left ilium with soft tissue extension seen on image 206 has a maximum SUV of 36.3 on image 205. Nodular opacity inthe muscle insertion around the right greater trochanter of the femur may relate to degenerative or altered weightbearing changes. No other FDG avid osseous metastasis identified within the scanned skeletal structures.   ________________________________  Sheral Apley Tammi Klippel, M.D.

## 2020-06-26 NOTE — Patient Instructions (Signed)
[facility]  Discharge Instructions: Thank you for choosing [facility] to provide your oncology and hematology care.   If you have a lab appointment with the [facility], please go directly to the [facility] and check in at the registration area.  Wear comfortable clothing and clothing appropriate for easy access to any Portacath or PICC line.   We strive to give you quality time with your provider. You may need to reschedule your appointment if you arrive late (15 or more minutes).  Arriving late affects you and other patients whose appointments are after yours.  Also, if you miss three or more appointments without notifying the office, you may be dismissed from the clinic at the provider's discretion.      For prescription refill requests, have your pharmacy contact our office and allow 72 hours for refills to be completed.    Today you received the following chemotherapy and/or immunotherapy agents Keytruda      To help prevent nausea and vomiting after your treatment, we encourage you to take your nausea medication as directed.  BELOW ARE SYMPTOMS THAT SHOULD BE REPORTED IMMEDIATELY: . *FEVER GREATER THAN 100.4 F (38 C) OR HIGHER . *CHILLS OR SWEATING . *NAUSEA AND VOMITING THAT IS NOT CONTROLLED WITH YOUR NAUSEA MEDICATION . *UNUSUAL SHORTNESS OF BREATH . *UNUSUAL BRUISING OR BLEEDING . *URINARY PROBLEMS (pain or burning when urinating, or frequent urination) . *BOWEL PROBLEMS (unusual diarrhea, constipation, pain near the anus) . TENDERNESS IN MOUTH AND THROAT WITH OR WITHOUT PRESENCE OF ULCERS (sore throat, sores in mouth, or a toothache) . UNUSUAL RASH, SWELLING OR PAIN  . UNUSUAL VAGINAL DISCHARGE OR ITCHING   Items with * indicate a potential emergency and should be followed up as soon as possible or go to the Emergency Department if any problems should occur.  Please show the CHEMOTHERAPY ALERT CARD or IMMUNOTHERAPY ALERT CARD  at check-in to the Emergency Department and triage nurse. Should you have questions after your visit or need to cancel or reschedule your appointment, please contact [facility]  [phone] and follow the prompts.  Office hours are 8:00 a.m. to 4:30 p.m. Monday - Friday. Please note that voicemails left after 4:00 p.m. may not be returned until the following business day.  We are closed weekends and major holidays. You have access to a nurse at all times for urgent questions. Please call the main number to the clinic [phone] and follow the prompts.  For any non-urgent questions, you may also contact your provider using MyChart. We now offer e-Visits for anyone 35 and older to request care online for non-urgent symptoms. For details visit [email or web address].   Also download the MyChart app! Go to the app store, search "MyChart", open the app, select [facility], and log in with your MyChart username and password.  Due to Covid, a mask is required upon entering the hospital/clinic. If you do not have a mask, one will be given to you upon arrival. For doctor visits, patients may have 1 support person aged 73 or older with them. For treatment visits, patients cannot have anyone with them due to current Covid guidelines and our immunocompromised population.

## 2020-06-27 ENCOUNTER — Telehealth: Payer: Self-pay

## 2020-06-27 LAB — IRON AND TIBC
Iron: 59 ug/dL (ref 42–163)
Saturation Ratios: 27 % (ref 20–55)
TIBC: 223 ug/dL (ref 202–409)
UIBC: 163 ug/dL (ref 117–376)

## 2020-06-27 LAB — FERRITIN: Ferritin: 1969 ng/mL — ABNORMAL HIGH (ref 24–336)

## 2020-06-27 NOTE — Telephone Encounter (Signed)
Per 06/26/20 los, appts already in place additional appts added   Brian [name]

## 2020-06-28 ENCOUNTER — Inpatient Hospital Stay: Payer: 59

## 2020-06-28 ENCOUNTER — Other Ambulatory Visit: Payer: Self-pay | Admitting: *Deleted

## 2020-06-28 ENCOUNTER — Telehealth: Payer: Self-pay | Admitting: *Deleted

## 2020-06-28 ENCOUNTER — Inpatient Hospital Stay (HOSPITAL_BASED_OUTPATIENT_CLINIC_OR_DEPARTMENT_OTHER): Payer: 59 | Admitting: Hematology & Oncology

## 2020-06-28 ENCOUNTER — Other Ambulatory Visit: Payer: Self-pay

## 2020-06-28 VITALS — BP 119/89 | HR 109 | Resp 20

## 2020-06-28 DIAGNOSIS — C73 Malignant neoplasm of thyroid gland: Secondary | ICD-10-CM | POA: Diagnosis not present

## 2020-06-28 DIAGNOSIS — D649 Anemia, unspecified: Secondary | ICD-10-CM

## 2020-06-28 DIAGNOSIS — R401 Stupor: Secondary | ICD-10-CM

## 2020-06-28 DIAGNOSIS — Z95828 Presence of other vascular implants and grafts: Secondary | ICD-10-CM

## 2020-06-28 DIAGNOSIS — E039 Hypothyroidism, unspecified: Secondary | ICD-10-CM

## 2020-06-28 DIAGNOSIS — E860 Dehydration: Secondary | ICD-10-CM

## 2020-06-28 DIAGNOSIS — R4020 Unspecified coma: Secondary | ICD-10-CM | POA: Diagnosis not present

## 2020-06-28 LAB — COMPREHENSIVE METABOLIC PANEL
ALT: 21 U/L (ref 0–44)
AST: 19 U/L (ref 15–41)
Albumin: 3.7 g/dL (ref 3.5–5.0)
Alkaline Phosphatase: 105 U/L (ref 38–126)
Anion gap: 7 (ref 5–15)
BUN: 32 mg/dL — ABNORMAL HIGH (ref 6–20)
CO2: 31 mmol/L (ref 22–32)
Calcium: 9.3 mg/dL (ref 8.9–10.3)
Chloride: 96 mmol/L — ABNORMAL LOW (ref 98–111)
Creatinine, Ser: 1.02 mg/dL (ref 0.61–1.24)
GFR, Estimated: >60 mL/min (ref >60)
Glucose, Bld: 136 mg/dL — ABNORMAL HIGH (ref 70–99)
Potassium: 3.4 mmol/L — ABNORMAL LOW (ref 3.5–5.1)
Sodium: 134 mmol/L — ABNORMAL LOW (ref 135–145)
Total Bilirubin: 0.5 mg/dL (ref 0.3–1.2)
Total Protein: 6 g/dL — ABNORMAL LOW (ref 6.5–8.1)

## 2020-06-28 LAB — CBC WITH DIFFERENTIAL (CANCER CENTER ONLY)
Abs Immature Granulocytes: 0.01 10*3/uL (ref 0.00–0.07)
Basophils Absolute: 0 10*3/uL (ref 0.0–0.1)
Basophils Relative: 0 %
Eosinophils Absolute: 0 10*3/uL (ref 0.0–0.5)
Eosinophils Relative: 1 %
HCT: 29.3 % — ABNORMAL LOW (ref 39.0–52.0)
Hemoglobin: 9.8 g/dL — ABNORMAL LOW (ref 13.0–17.0)
Immature Granulocytes: 0 %
Lymphocytes Relative: 8 %
Lymphs Abs: 0.2 10*3/uL — ABNORMAL LOW (ref 0.7–4.0)
MCH: 29.4 pg (ref 26.0–34.0)
MCHC: 33.4 g/dL (ref 30.0–36.0)
MCV: 88 fL (ref 80.0–100.0)
Monocytes Absolute: 0.5 10*3/uL (ref 0.1–1.0)
Monocytes Relative: 17 %
Neutro Abs: 2.1 10*3/uL (ref 1.7–7.7)
Neutrophils Relative %: 74 %
Platelet Count: 206 10*3/uL (ref 150–400)
RBC: 3.33 MIL/uL — ABNORMAL LOW (ref 4.22–5.81)
RDW: 17.2 % — ABNORMAL HIGH (ref 11.5–15.5)
WBC Count: 2.8 10*3/uL — ABNORMAL LOW (ref 4.0–10.5)
nRBC: 0 % (ref 0.0–0.2)

## 2020-06-28 LAB — TYPE AND SCREEN
ABO/RH(D): O NEG
Antibody Screen: NEGATIVE

## 2020-06-28 LAB — AMMONIA: Ammonia: 23 umol/L (ref 9–35)

## 2020-06-28 MED ORDER — SODIUM CHLORIDE 0.9% FLUSH
10.0000 mL | INTRAVENOUS | Status: DC | PRN
  Administered 2020-06-28: 10 mL via INTRAVENOUS
  Filled 2020-06-28: qty 10

## 2020-06-28 MED ORDER — NALOXONE HCL 2 MG/2ML IJ SOSY
PREFILLED_SYRINGE | INTRAMUSCULAR | Status: AC
  Filled 2020-06-28: qty 2

## 2020-06-28 MED ORDER — HEPARIN SOD (PORK) LOCK FLUSH 100 UNIT/ML IV SOLN
500.0000 [IU] | Freq: Once | INTRAVENOUS | Status: AC
  Administered 2020-06-28: 500 [IU] via INTRAVENOUS
  Filled 2020-06-28: qty 5

## 2020-06-28 MED ORDER — NALOXONE HCL 0.4 MG/ML IJ SOLN
0.2000 mg | INTRAMUSCULAR | Status: DC | PRN
  Administered 2020-06-28: 0.2 mg via INTRAVENOUS
  Filled 2020-06-28: qty 1

## 2020-06-28 NOTE — Patient Instructions (Addendum)
Opioid Overdose Opioids are drugs that are often used to treat pain. Opioids include illegal drugs, such as heroin, as well as prescription pain medicines, such as codeine, morphine, hydrocodone, oxycodone, and fentanyl. An opioid overdose happens when you take too much of an opioid. An overdose may be intentional or accidental and can happen with any type of opioid. The effects of an overdose can be mild, dangerous, or even deadly. Opioid overdose is a medical emergency. What are the causes? This condition may be caused by:  Taking too much of an opioid on purpose.  Taking too much of an opioid by accident.  Using two or more substances that contain opioids at the same time.  Taking an opioid with a substance that affects your heart, breathing, or blood pressure. These include alcohol, tranquilizers, sleeping pills, illegal drugs, and some over-the-counter medicines. This condition may also happen due to an error made by:  A health care provider who prescribes a medicine.  The pharmacist who fills the prescription order. What increases the risk? This condition is more likely in:  Children. They may be attracted to colorful pills. Because of a child's small size, even a small amount of a drug can be dangerous.  Older people. They may be taking many different drugs. Older people may have difficulty reading labels or remembering when they last took their medicine. They may also be more sensitive to the effects of opioids.  People with chronic medical conditions, especially heart, liver, kidney, or neurological diseases.  People who take an opioid for a long period of time.  People who use: ? Illegal drugs. IV heroin is especially dangerous. ? Other substances, including alcohol, while using an opioid.  People who have: ? A history of drug or alcohol abuse. ? Certain mental health conditions. ? A history of previous drug overdoses.  People who take opioids that are not prescribed  for them. What are the signs or symptoms? Symptoms of this condition depend on the type of opioid and the amount that was taken. Common symptoms include:  Sleepiness or difficulty waking from sleep.  Decrease in attention.  Confusion.  Slurred speech.  Slowed breathing and a slow pulse (bradycardia).  Nausea and vomiting.  Abnormally small pupils. Signs and symptoms that require emergency treatment include:  Cold, clammy, and pale skin.  Blue lips and fingernails.  Vomiting.  Gurgling sounds in the throat.  A pulse that is very slow or difficult to detect.  Breathing that is very irregular, slow, noisy, or difficult to detect.  Limp body.  Inability to respond to speech or be awakened from sleep (stupor).  Seizures. How is this diagnosed? This condition is diagnosed based on your symptoms and medical history. It is important to tell your health care provider:  About all of the opioids that you took.  When you took the opioids.  Whether you were drinking alcohol or using marijuana, cocaine, or other drugs. Your health care provider will do a physical exam. This exam may include:  Checking and monitoring your heart rate and rhythm, breathing rate, temperature, and blood pressure (vital signs).  Measuring oxygen levels in your blood.  Checking for abnormally small pupils. You may also have blood tests or urine tests. You may have X-rays if you are having severe breathing problems. How is this treated? This condition requires immediate medical treatment and hospitalization. Treatment is given in the hospital intensive care (ICU) setting. Supporting your vital signs and your breathing is the first step in  treating an opioid overdose. Treatment may also include:  Giving salts and minerals (electrolytes) along with fluids through an IV.  Inserting a breathing tube (endotracheal tube) in your airway to help you breathe if you cannot breathe on your own or you are in  danger of not being able to breathe on your own.  Giving oxygen through a small tube under your nose.  Passing a tube through your nose and into your stomach (nasogastric tube, or NG tube) to empty your stomach.  Giving medicines that: ? Increase your blood pressure. ? Relieve nausea and vomiting. ? Relieve abdominal pain and cramping. ? Reverse the effects of the opioid (naloxone).  Monitoring your heart and oxygen levels.  Ongoing counseling and mental health support if you intentionally overdosed or used an illegal drug. Follow these instructions at home: Medicines  Take over-the-counter and prescription medicines only as told by your health care provider.  Always ask your health care provider about possible side effects and interactions of any new medicine that you start taking.  Keep a list of all the medicines that you take, including over-the-counter medicines. Bring this list with you to all your medical visits. General instructions  Drink enough fluid to keep your urine pale yellow.  Keep all follow-up visits as told by your health care provider. This is important.   How is this prevented?  Read the drug inserts that come with your opioid pain medicines.  Take medicines only as told by your health care provider. Do not take more medicine than you are told. Do not take medicines more frequently than you are told.  Do not drink alcohol or take sedatives when taking opioids.  Do not use illegal or recreational drugs, including cocaine, ecstasy, and marijuana.  Do not take opioid medicines that are not prescribed for you.  Store all medicines in safety containers that are out of the reach of children.  Get help if you are struggling with: ? Alcohol or drug use. ? Depression or another mental health problem. ? Thoughts of hurting yourself or another person.  Keep the phone number of your local poison control center near your phone or in your mobile phone. In the  [place] the hotline of the [facility] is (239)851-7301.  If you were prescribed naloxone, make sure you understand how to take it. Contact a health care provider if you:  Need help understanding how to take your pain medicines.  Feel your medicines are too strong.  Are concerned that your pain medicines are not working well for your pain.  Develop new symptoms or side effects when you are taking medicines. Get help right away if:  You or someone else is having symptoms of an opioid overdose. Get help even if you are not sure.  You have serious thoughts about hurting yourself or others.  You have: ? Chest pain. ? Difficulty breathing. ? A loss of consciousness. These symptoms may represent a serious problem that is an emergency. Do not wait to see if the symptoms will go away. Get medical help right away. Call your local emergency services (911 in the [place] Do not drive yourself to the hospital. If you ever feel like you may hurt yourself or others, or have thoughts about taking your own life, get help right away. You can go to your nearest emergency department or call:  Your local emergency services (911 in the [place]  A suicide crisis helpline, such as the [place]  at (870) 171-6282. This is open 24 hours a day. Summary  Opioids are drugs that are often used to treat pain. Opioids include illegal drugs, such as heroin, as well as prescription pain medicines.  An opioid overdose happens when you take too much of an opioid.  Overdoses can be intentional or accidental.  Opioid overdose is very dangerous. It is a life-threatening emergency.  If you or someone you know is experiencing an opioid overdose, get help right away. This information is not intended to replace advice given to you by your health care provider. Make sure you discuss any questions you have with your health care provider. Document Revised: 01/20/2018 Document  Reviewed: 01/20/2018 Elsevier Patient Education  2021 [place]. Naloxone injection What is this medicine? NALOXONE (nal OX one) is a narcotic blocker. It is used to treat narcotic (opioid) drug overdose. It is used to temporarily reverse the effects of opioid medicines. This medicine has no effect in people who are not taking opioid medicines. This medicine may be used for other purposes; ask your health care provider or pharmacist if you have questions. COMMON BRAND NAME(S): EVZIO, Narcan What should I tell my health care provider before I take this medicine? They need to know if you have any of these conditions:  drug abuse or addiction  heart disease  an unusual or allergic reaction to naloxone, other medicines, foods, dyes, or preservatives  pregnant or trying to get pregnantbreast-feeding How should I use this medicine? This medicine may be administered in a hospital, clinic, or can be used by the public to give aid to a person who has overdosed until emergency medical help is available. This medicine is for injection into the outer thigh. It can be injected through clothing if needed. Get emergency medical help right away after giving the first dose of this medicine, even if the person wakes up. You should be familiar with how to recognize the signs and symptoms of a narcotic overdose. Administer according to the printed instructions on the device label or the electronic voice instructions. You should practice using the Trainer injector before this medicine is needed. Talk to your pediatrician regarding the use of this medicine in children. While this drug may be prescribed for children as young as newborn for selected conditions, precautions do apply. For infants less than 1 year of age, pinch the thigh muscle while administering. Overdosage: If you think you have taken too much of this medicine contact a poison control center or emergency room at once. NOTE: This medicine is only  for you. Do not share this medicine with others. What if I miss a dose? This does not apply. What may interact with this medicine? This medicine is only used during an emergency. No interactions are expected during emergency use. This list may not describe all possible interactions. Give your health care provider a list of all the medicines, herbs, non-prescription drugs, or dietary supplements you use. Also tell them if you smoke, drink alcohol, or use illegal drugs. Some items may interact with your medicine. What should I watch for while using this medicine? Keep this medicine ready for use in the case of a narcotic overdose. Make sure that you have the phone number of your doctor or health care professional and local hospital ready. You may need to have additional doses of this medicine. Each injector contains a single dose. Some emergencies may require additional doses. After use, bring the treated person to the nearest hospital or call 911. Make  sure the treating health care professional knows that the person has received an injection of this medicine. You will receive additional instructions on what to do during and after use of this medicine before an emergency occurs. What side effects may I notice from receiving this medicine? Side effects that you should report to your doctor or health care professional as soon as possible:  allergic reactions like skin rash, itching or hives, swelling of the face, lips, or tongue  breathing problems  fast, irregular heartbeat  high blood pressure  pain that was controlled by narcotic pain medicine  seizures Side effects that usually do not require medical attention (report to your doctor or health care professional if they continue or are bothersome):  anxious  chills  diarrhea  fever  nausea, vomiting  sweating This list may not describe all possible side effects. Call your doctor for medical advice about side effects. You may report  side effects to FDA at 1-800-FDA-1088. Where should I keep my medicine? Keep out of the reach of children. Store at room temperature between 15 and 25 degrees C (59 and 77 degrees F). If you are using this medicine at home, you will be instructed on how to store this medicine. Keep this medicine in its outer case until ready to use. Occasionally check the solution through the viewing window of the injector. The solution should be clear. If it is discolored, cloudy, or contains solid particles, replace it with a new injector. Remember to check the expiration date of this medicine regularly. Throw away any unused medicine after the expiration date. NOTE: This sheet is a summary. It may not cover all possible information. If you have questions about this medicine, talk to your doctor, pharmacist, or health care provider.  2021 Elsevier/Gold Standard (2015-02-12 16:45:38)

## 2020-06-28 NOTE — Patient Instructions (Signed)

## 2020-06-28 NOTE — Progress Notes (Signed)
4:21 PM More alert, answering questions appropriately. Will continue to monitor. 4:58 PMDischarged via [place] accompanied by father. No acute distress. Awake and alert.

## 2020-06-28 NOTE — Telephone Encounter (Signed)
Received a call from patients wife Brian [name] saying that patient has been out of it all day, lethargic, and confused.  Dr Marin Olp notified.  Wants patient to come in for labwork and assessment.  Will bring patient in ASAP

## 2020-06-28 NOTE — Progress Notes (Signed)
Hematology and Oncology Follow Up Visit  Brian [name] [id number] [birth year] 45 y.o. 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 + Bilateral pulmonary emboli without right heart strain  DVT left popliteal vein   Past Therapy: Cabometyx 40 mg po q day -- start on 04/17/2019 - d/c on 07/17/2019 for progression Lenvima 24 mg po q day -- on hold due to proteinuria Taxotere/CDDP -- s/p cycle 4- started on 09/13/2019  Current Therapy: Gavreto 400 mg po q day -- started on 05/17/2020-- d/c on 06/16/2020 Adriamycin weekly 2 weeks on 1 week off - s/p cycle8 --d/c on 04/16/2020 due to progression XRT for LEFT hip met Pembrolizumab 200 mg IV q 3 week -- start on 06/26/2020 Xgeva 120 mg IM q 3 months -- start on 07/17/2020 Eliquis 5 mg po BID   Interim History:  [name] is here today for an unscheduled visit.  His wife called today saying that he was confused.  He was quite lethargic.  We gave him pembrolizumab a couple days ago.  This was on the recommendation from [facility].  He came in.  He was quite lethargic.  He woke up and then kept falling asleep.  When he is awake, he did answer some questions.  He seemed to be relatively oriented when he was answering questions.  He did not have any obvious diarrhea.  There is no double vision.  He had no rashes.  He had no cough or shortness of breath.  There is no fever.  There is no bleeding.  He had no change in his medications.  He is on a fairly potent regimen of pain medication because of his pain from his liver metastasis.  He has issues with his left hip.  He will see Radiation Oncology next week.  He seems to be eating okay.  He is drinking enough.  Overall, I would have to say his performance status is probably ECOG 1.   Medications:  Allergies as of 06/28/2020      Reactions   Dextromethorphan-guaifenesin Other (See Comments)   Irregular heartbeat    Doxycycline Anaphylaxis, Nausea And Vomiting, Rash, Other (See Comments)   "heart arrythmia" and "dyspepsia" (only oral doxycycline causes reaction)   Gadobutrol Hives, Other (See Comments)   Patient had MRI scan at [place]. Patient called one hour after he left imaging facility to report two "blisters" that came up on "back" of lip.    Gadolinium Derivatives Hives   Patient had MRI scan at [place]. Patient called one hour after he left imaging facility to report two "blisters" that came up on "back" of lip.    Guaifenesin Palpitations      Ibuprofen Hives, Itching   Lisinopril Other (See Comments)   Angioedema   Pantoprazole Itching   Tramadol Hives, Itching   Barium Rash   Developed redness around neck after drinking 1st bottle of Barocat; pt was given Benedryl by ED Mds to be "on the safe side" before drinking 2nd bottle   Chlorhexidine Hives      Medication List       Accurate as of Jun 28, 2020  4:13 PM. If you have any questions, ask your nurse or doctor.        STOP taking these medications   enoxaparin 120 MG/0.8ML injection Commonly known as: LOVENOX   Gavreto 100 MG Caps Generic drug: Pralsetinib   lidocaine-prilocaine cream Commonly known as: EMLA  TAKE these medications   baclofen 10 MG tablet Commonly known as: LIORESAL Take 1 tablet (10 mg total) by mouth 3 (three) times daily.   Banophen 25 mg capsule Generic drug: diphenhydrAMINE TAKE 1 CAPSULE BY MOUTH 1 HOUR BEFORE CT SCAN   cyclobenzaprine 10 MG tablet Commonly known as: FLEXERIL Take 1 tablet (10 mg total) by mouth 3 (three) times daily as needed.   dexamethasone 4 MG tablet Commonly known as: DECADRON TAKE 2 TABLETS (8 MG TOTAL) BY MOUTH DAILY. START THE DAY AFTER CHEMOTHERAPY FOR 2 DAYS.   dronabinol 5 MG capsule Commonly known as: MARINOL TAKE 1 CAPSULE (5 MG TOTAL) BY MOUTH 2 (TWO) TIMES DAILY BEFORE A MEAL.   Eliquis 5 MG Tabs tablet Generic drug:  apixaban TAKE 1 TABLET (5 MG TOTAL) BY MOUTH 2 (TWO) TIMES DAILY.   EPINEPHrine 0.3 mg/0.3 mL Soaj injection Commonly known as: EpiPen 2-Pak USE AS DIRECTED FOR LIFE THREATENING ALLERGIC REACTIONS   famotidine 40 MG tablet Commonly known as: PEPCID Take 1 tablet (40 mg total) by mouth 2 (two) times daily.   furosemide 80 MG tablet Commonly known as: LASIX TAKE 1 TABLET (80 MG TOTAL) BY MOUTH DAILY.   Fusion Plus Caps Take 1 capsule by mouth daily.   gabapentin 300 MG capsule Commonly known as: NEURONTIN TAKE 1 CAPSULE BY MOUTH THREE TIMES DAILY   HYDROmorphone 4 MG tablet Commonly known as: DILAUDID Take one to two tablets by mouth every four hours as needed for pain   levothyroxine 200 MCG tablet Commonly known as: SYNTHROID Take one tablet (200 mcg dose) by mouth daily. Total:  288 mcg/day.   levothyroxine 88 MCG tablet Commonly known as: SYNTHROID Take one tablet (88 mcg dose) by mouth daily. Total:  288 mcg/day.   LORazepam 1 MG tablet Commonly known as: ATIVAN Take 1 tablet (1 mg total) by mouth every 6 (six) hours as needed for anxiety. What changed: Another medication with the same name was removed. Continue taking this medication, and follow the directions you see here.   losartan 25 MG tablet Commonly known as: COZAAR Take 25 mg by mouth as needed.   methylphenidate 10 MG tablet Commonly known as: RITALIN Take 1 tablet (10 mg total) by mouth 2 (two) times daily.   metoCLOPramide 10 MG tablet Commonly known as: REGLAN Take 1 tablet (10 mg total) by mouth every 6 (six) hours as needed for nausea or vomiting.   metolazone 5 MG tablet Commonly known as: ZAROXOLYN TAKE 1 TABLET (5 MG TOTAL) BY MOUTH DAILY. TAKE 1 HOUR BEFORE LASIX.   multivitamin capsule Take 1 capsule by mouth daily.   OLANZapine 10 MG tablet Commonly known as: ZYPREXA TAKE 1 TABLET (10 MG TOTAL) BY MOUTH AT BEDTIME.   ondansetron 8 MG tablet Commonly known as: Zofran Take 1 tablet  (8 mg total) by mouth 2 (two) times daily as needed (Nausea or vomiting).   OxyCONTIN 30 MG 12 hr tablet Generic drug: oxyCODONE Take 1 tablet (30 mg total) by mouth every 8 (eight) hours.   polyethylene glycol 17 g packet Commonly known as: MIRALAX / GLYCOLAX Take 17 g by mouth daily as needed for mild constipation.   potassium chloride 10 MEQ tablet Commonly known as: KLOR-CON TAKE 1 TABLET BY MOUTH ONCE DAILY   predniSONE 50 MG tablet Commonly known as: DELTASONE TAKE 1 TABLET BY MOUTH 13 HOURS BEFORE CT SCAN, 1 TABLET 7 HOURS BEFORE CT SCAN, AND 1 TABLET 1 HOUR BEFORE CT SCAN  prochlorperazine 10 MG tablet Commonly known as: COMPAZINE Take 1 tablet (10 mg total) by mouth every 6 (six) hours as needed (Nausea or vomiting).   promethazine 25 MG tablet Commonly known as: PHENERGAN TAKE 1 TABLET BY MOUTH EVERY 6 HOURS AS NEEDED FOR NAUSEA & VOMITING   senna-docusate 8.6-50 MG tablet Commonly known as: Senokot-S Take 1 tablet by mouth at bedtime as needed for mild constipation.       Allergies:  Allergies  Allergen Reactions  . Dextromethorphan-Guaifenesin Other (See Comments)    Irregular heartbeat   . Doxycycline Anaphylaxis, Nausea And Vomiting, Rash and Other (See Comments)    "heart arrythmia" and "dyspepsia" (only oral doxycycline causes reaction)  . Gadobutrol Hives and Other (See Comments)    Patient had MRI scan at [place]. Patient called one hour after he left imaging facility to report two "blisters" that came up on "back" of lip.    . Gadolinium Derivatives Hives    Patient had MRI scan at [place]. Patient called one hour after he left imaging facility to report two "blisters" that came up on "back" of lip.   . Guaifenesin Palpitations       . Ibuprofen Hives and Itching  . Lisinopril Other (See Comments)    Angioedema  . Pantoprazole Itching  . Tramadol Hives and Itching  . Barium Rash    Developed redness around neck after  drinking 1st bottle of Barocat; pt was given Benedryl by ED Mds to be "on the safe side" before drinking 2nd bottle  . Chlorhexidine Hives    Past Medical History, Surgical history, Social history, and Family History were reviewed and updated.  Review of Systems: Review of Systems  Constitutional: Positive for malaise/fatigue.  HENT: Negative.   Eyes: Negative.   Respiratory: Negative.   Cardiovascular: Negative.   Gastrointestinal: Positive for abdominal pain, constipation, heartburn and nausea.  Genitourinary: Negative.   Musculoskeletal: Positive for joint pain.  Skin: Negative.   Neurological: Negative.   Endo/Heme/Allergies: Negative.   Psychiatric/Behavioral: Negative.      Physical Exam:  vitals were not taken for this visit.   Wt Readings from Last 3 Encounters:  06/26/20 281 lb (127.5 kg)  06/10/20 284 lb (128.8 kg)  05/20/20 272 lb (123.4 kg)    Physical Exam Vitals reviewed.  HENT:     Head: Normocephalic and atraumatic.  Eyes:     Pupils: Pupils are equal, round, and reactive to light.  Cardiovascular:     Rate and Rhythm: Normal rate and regular rhythm.     Heart sounds: Normal heart sounds.  Pulmonary:     Effort: Pulmonary effort is normal.     Breath sounds: Normal breath sounds.  Abdominal:     General: Bowel sounds are normal.     Palpations: Abdomen is soft.  Musculoskeletal:        General: No tenderness or deformity. Normal range of motion.     Cervical back: Normal range of motion.  Lymphadenopathy:     Cervical: No cervical adenopathy.  Skin:    General: Skin is warm and dry.     Findings: No erythema or rash.  Neurological:     Mental Status: He is alert and oriented to person, place, and time.  Psychiatric:        Behavior: Behavior normal.        Thought Content: Thought content normal.        Judgment: Judgment normal.      Lab  Results  Component Value Date   WBC 2.8 (L) 06/28/2020   HGB 9.8 (L) 06/28/2020   HCT 29.3 (L)  06/28/2020   MCV 88.0 06/28/2020   PLT 206 06/28/2020   Lab Results  Component Value Date   FERRITIN 1,969 (H) 06/26/2020   IRON 59 06/26/2020   TIBC 223 06/26/2020   UIBC 163 06/26/2020   IRONPCTSAT 27 06/26/2020   Lab Results  Component Value Date   RETICCTPCT 2.2 06/10/2020   RBC 3.33 (L) 06/28/2020   No results found for: KPAFRELGTCHN, LAMBDASER, KAPLAMBRATIO No results found for: IGGSERUM, IGA, IGMSERUM No results found for: Kathrynn Ducking, MSPIKE, SPEI   Chemistry      Component Value Date/Time   NA 134 (L) 06/28/2020 1419   NA 141 02/06/2019 1210   K 3.4 (L) 06/28/2020 1419   CL 96 (L) 06/28/2020 1419   CO2 31 06/28/2020 1419   BUN 32 (H) 06/28/2020 1419   BUN 8 02/06/2019 1210   CREATININE 1.02 06/28/2020 1419   CREATININE 0.99 06/10/2020 0848      Component Value Date/Time   CALCIUM 9.3 06/28/2020 1419   ALKPHOS 105 06/28/2020 1419   AST 19 06/28/2020 1419   AST 38 06/10/2020 0848   ALT 21 06/28/2020 1419   ALT 40 06/10/2020 0848   BILITOT 0.5 06/28/2020 1419   BILITOT 0.4 06/10/2020 0848       Impression and Plan: 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  We did give him a dose of Narcan.  He got 0.2 mg.  This seemed to help.  He became more alert.  He was coherent.  He was not complain of any obvious pain.  I am not sure if he may have taken a little bit extra pain medication.  He was taking OxyContin every 8 hours.  I told him to take this every 12 hours.  He also had been taking extra hydromorphone.  I told him that he really has to take 4 mg for right now.  The he may have been taking some Compazine also.  This really could have been augmenting the response from the narcotic medications.  I am just glad that he is doing better.  The Narcan clearly helped.  We will keep him for some IV fluids for an hour or so.  I  feel much better with him being able to go home.  I think that he is back to his baseline.   [name], MD 5/13/20224:13 PM

## 2020-07-01 LAB — TSH: TSH: 0.834 u[IU]/mL (ref 0.320–4.118)

## 2020-07-02 ENCOUNTER — Telehealth: Payer: Self-pay | Admitting: *Deleted

## 2020-07-02 NOTE — Telephone Encounter (Signed)
No 06/28/20 los

## 2020-07-05 ENCOUNTER — Encounter: Payer: Self-pay | Admitting: Radiation Oncology

## 2020-07-05 ENCOUNTER — Other Ambulatory Visit: Payer: Self-pay

## 2020-07-05 ENCOUNTER — Ambulatory Visit
Admission: RE | Admit: 2020-07-05 | Discharge: 2020-07-05 | Disposition: A | Discharge status: Home / Self Care | Payer: 59 | Source: Ambulatory Visit | Attending: Radiation Oncology | Admitting: Radiation Oncology

## 2020-07-05 VITALS — BP 111/80 | HR 104 | Temp 97.8°F | Resp 20 | Ht 70.0 in | Wt 275.2 lb

## 2020-07-05 DIAGNOSIS — G893 Neoplasm related pain (acute) (chronic): Secondary | ICD-10-CM | POA: Diagnosis not present

## 2020-07-05 DIAGNOSIS — C73 Malignant neoplasm of thyroid gland: Secondary | ICD-10-CM

## 2020-07-05 DIAGNOSIS — C799 Secondary malignant neoplasm of unspecified site: Secondary | ICD-10-CM

## 2020-07-05 DIAGNOSIS — C7989 Secondary malignant neoplasm of other specified sites: Secondary | ICD-10-CM | POA: Diagnosis not present

## 2020-07-05 DIAGNOSIS — C787 Secondary malignant neoplasm of liver and intrahepatic bile duct: Secondary | ICD-10-CM | POA: Diagnosis not present

## 2020-07-05 NOTE — Progress Notes (Signed)
Radiation Oncology         [phone] ________________________________  Outpatient Re-Consultation  Name: Brian [name] MRN: [id number]  Date of Service: 07/05/2020 DOB: [birth year]  OQ:HUTMLY, [name], MD  [name], MD   REFERRING PHYSICIAN: [name], MD  DIAGNOSIS: 45 y.o. 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    ICD-10-CM   1. Metastasis from thyroid cancer ([place])  C79.9    C73   2. Liver metastases ([place])  C78.7   3. Primary cancer of thyroid with metastasis to other site [facility])  C73     HISTORY OF PRESENT ILLNESS: Brian [name] is a 45 y.o. male seen at the request of Dr. Marin Olp.  He has a history of aggressive, Hurthle cell carcinoma of the thyroid and has been on multiple courses of systemic therapy under the care and direction of Dr. Marin Olp.  He presented to the emergency department on 04/11/2020 with worsening, severe abdominal pain.  He has some chronic right-sided abdominal pain associated with his metastatic disease but had noted worsening pain over the past week, becoming severe despite a recent increase in his narcotic pain medications with long-acting oxycodone and using OxyIR for breakthrough pain.  A CT A/P was performed on admission and showed interval enlargement of the metastatic liver lesion (7.0 x 6.2 x 6.3 cm) and a portacaval lymph node (6.8 x 5.2 cm) as compared to his prior imaging on 02/08/2020.  The patient was admitted for pain control and we were consulted for consideration of palliative radiotherapy which was completed on 04/26/20.  Unfortunately, follow-up restaging MRI abdomen showed disease progression in the liver lesions despite the recent course of radiotherapy and systemic treatment with Gavreto 400 mg daily.  He was seen at the [facility] at the end of April 2022 and the recommendation was to d/c the [facility] and proceed with pembrolizumab (Keytruda) 200 mg  every 3 weeks which was started under the care of Dr. Marin Olp on 06/26/2020.  As part of his work-up at [facility], he had a repeat PET scan on 06/14/2020 which showed the treated portacaval node has decreased in size but there were persistent liver mets as well as a new 3 cm lytic lesion involving the left ilium with soft tissue extension.  He has been having significant pain in the left hip area that he was attributing to nerve irritation from a previous procedure.  He reports persistent pain at a 4 out of 10 while sitting but an increase up to 9 out of 10 with any ambulation or activity.  The pain does occasionally wake him from sleep at night.  He is no longer getting adequate pain relief with his narcotic pain medications.  He denies any numbness or tingling in the lower extremities or focal weakness.  He has not had any changes in his bowel or bladder control and otherwise feels well in general.  He has been sent back to [place] today to discuss palliative radiotherapy to the new, painful metastatic lesion in the left ilium.  PREVIOUS RADIATION THERAPY: Yes  04/15/20 - 04/26/20:The liver nodule and portacaval lymph node were treated to a prescription dose of 30 Gy in 10 fractions of 3 Gy each.  PAST MEDICAL HISTORY:  Past Medical History:  Diagnosis Date  . Chronic diastolic CHF (congestive heart failure) ([place]) 04/11/2020  . DVT, lower extremity, distal, acute, left ([place]) 01/14/2020  . Essential hypertension  09/09/2018  . Goals of care, counseling/discussion 01/14/2020  . Kidney stone   . Primary cancer of thyroid with metastasis to other site ([place]) 05/04/2018  . Rickettsia infection       PAST SURGICAL HISTORY: Past Surgical History:  Procedure Laterality Date  . BIOPSY  04/05/2018   Procedure: BIOPSY;  Surgeon: Ronald Lobo, MD;  Location: [facility];  Service: Endoscopy;;  . CHOLECYSTECTOMY    . ESOPHAGOGASTRODUODENOSCOPY (EGD) WITH PROPOFOL N/A 04/05/2018   Procedure:  ESOPHAGOGASTRODUODENOSCOPY (EGD) WITH PROPOFOL;  Surgeon: Ronald Lobo, MD;  Location: [facility];  Service: Endoscopy;  Laterality: N/A;  . IR IMAGING GUIDED PORT INSERTION  09/13/2019  . IR RADIOLOGIST EVAL & MGMT  10/11/2018    FAMILY HISTORY:  Family History  Problem Relation Age of Onset  . Diabetes Mother   . Heart disease Mother   . Heart failure Mother   . Heart attack Mother   . Hyperlipidemia Mother   . Hypertension Mother   . Hyperlipidemia Father   . Allergic rhinitis Father   . Rashes / Skin problems Father   . Sudden death Neg Hx   . Thyroid disease Neg Hx     SOCIAL HISTORY:  Social History   Socioeconomic History  . Marital status: Married    Spouse name: Not on file  . Number of children: Not on file  . Years of education: Not on file  . Highest education level: Not on file  Occupational History  . Not on file  Tobacco Use  . Smoking status: Never Smoker  . Smokeless tobacco: Never Used  Vaping Use  . Vaping Use: Never used  Substance and Sexual Activity  . Alcohol use: No  . Drug use: No  . Sexual activity: Not on file  Other Topics Concern  . Not on file  Social History Narrative  . Not on file   Social Determinants of Health   Financial Resource Strain: Not on file  Food Insecurity: Not on file  Transportation Needs: Not on file  Physical Activity: Not on file  Stress: Not on file  Social Connections: Not on file  Intimate Partner Violence: Not on file    ALLERGIES: Dextromethorphan-guaifenesin, Doxycycline, Gadobutrol, Gadolinium derivatives, Guaifenesin, Ibuprofen, Lisinopril, Pantoprazole, Tramadol, Barium, and Chlorhexidine  MEDICATIONS:  Current Outpatient Medications  Medication Sig Dispense Refill  . apixaban (ELIQUIS) 5 MG TABS tablet TAKE 1 TABLET (5 MG TOTAL) BY MOUTH 2 (TWO) TIMES DAILY. 60 tablet 6  . baclofen (LIORESAL) 10 MG tablet Take 1 tablet (10 mg total) by mouth 3 (three) times daily. (Patient not taking:  Reported on 06/28/2020) 90 each 0  . cyclobenzaprine (FLEXERIL) 10 MG tablet Take 1 tablet (10 mg total) by mouth 3 (three) times daily as needed. (Patient not taking: Reported on 06/28/2020) 90 tablet 1  . dexamethasone (DECADRON) 4 MG tablet TAKE 2 TABLETS (8 MG TOTAL) BY MOUTH DAILY. START THE DAY AFTER CHEMOTHERAPY FOR 2 DAYS. (Patient not taking: No sig reported) 30 tablet 1  . diphenhydrAMINE (BENADRYL) 25 mg capsule TAKE 1 CAPSULE BY MOUTH 1 HOUR BEFORE CT SCAN (Patient not taking: Reported on 06/28/2020) 100 capsule 0  . dronabinol (MARINOL) 5 MG capsule TAKE 1 CAPSULE (5 MG TOTAL) BY MOUTH 2 (TWO) TIMES DAILY BEFORE A MEAL. (Patient not taking: Reported on 06/28/2020) 60 capsule 0  . EPINEPHrine (EPIPEN 2-PAK) 0.3 mg/0.3 mL IJ SOAJ injection USE AS DIRECTED FOR LIFE THREATENING ALLERGIC REACTIONS (Patient not taking: Reported on 06/28/2020) 4 Device 3  .  famotidine (PEPCID) 40 MG tablet Take 1 tablet (40 mg total) by mouth 2 (two) times daily. 60 tablet 4  . furosemide (LASIX) 80 MG tablet TAKE 1 TABLET (80 MG TOTAL) BY MOUTH DAILY. (Patient not taking: Reported on 06/28/2020) 30 tablet 1  . gabapentin (NEURONTIN) 300 MG capsule TAKE 1 CAPSULE BY MOUTH THREE TIMES DAILY 90 capsule 2  . HYDROmorphone (DILAUDID) 4 MG tablet Take one to two tablets by mouth every four hours as needed for pain (Patient taking differently: Take one to two tablets by mouth every four hours as needed for pain) 60 tablet 0  . Iron-FA-B Cmp-C-Biot-Probiotic (FUSION PLUS) CAPS Take 1 capsule by mouth daily. (Patient not taking: No sig reported) 30 capsule 6  . levothyroxine (SYNTHROID) 200 MCG tablet Take one tablet (200 mcg dose) by mouth daily. Total:  288 mcg/day. 90 tablet 3  . levothyroxine (SYNTHROID) 88 MCG tablet Take one tablet (88 mcg dose) by mouth daily. Total:  288 mcg/day. 90 tablet 3  . LORazepam (ATIVAN) 1 MG tablet Take 1 tablet (1 mg total) by mouth every 6 (six) hours as needed for anxiety. 30 tablet 0  .  losartan (COZAAR) 25 MG tablet Take 25 mg by mouth as needed. (Patient not taking: Reported on 06/28/2020)    . methylphenidate (RITALIN) 10 MG tablet Take 1 tablet (10 mg total) by mouth 2 (two) times daily. (Patient not taking: Reported on 06/28/2020) 60 tablet 0  . metoCLOPramide (REGLAN) 10 MG tablet Take 1 tablet (10 mg total) by mouth every 6 (six) hours as needed for nausea or vomiting. (Patient not taking: Reported on 06/28/2020) 30 tablet 0  . metolazone (ZAROXOLYN) 5 MG tablet TAKE 1 TABLET (5 MG TOTAL) BY MOUTH DAILY. TAKE 1 HOUR BEFORE LASIX. (Patient not taking: Reported on 06/28/2020) 30 tablet 1  . Multiple Vitamin (MULTIVITAMIN) capsule Take 1 capsule by mouth daily.    Marland Kitchen OLANZapine (ZYPREXA) 10 MG tablet TAKE 1 TABLET (10 MG TOTAL) BY MOUTH AT BEDTIME. 30 tablet 4  . ondansetron (ZOFRAN) 8 MG tablet Take 1 tablet (8 mg total) by mouth 2 (two) times daily as needed (Nausea or vomiting). 30 tablet 1  . oxyCODONE 30 MG 12 hr tablet Take 1 tablet (30 mg total) by mouth every 8 (eight) hours. 90 tablet 0  . polyethylene glycol (MIRALAX / GLYCOLAX) 17 g packet Take 17 g by mouth daily as needed for mild constipation. (Patient not taking: Reported on 06/28/2020) 14 each 0  . potassium chloride (KLOR-CON) 10 MEQ tablet TAKE 1 TABLET BY MOUTH ONCE DAILY (Patient not taking: No sig reported) 30 tablet 1  . predniSONE (DELTASONE) 50 MG tablet TAKE 1 TABLET BY MOUTH 13 HOURS BEFORE CT SCAN, 1 TABLET 7 HOURS BEFORE CT SCAN, AND 1 TABLET 1 HOUR BEFORE CT SCAN (Patient not taking: No sig reported) 3 tablet 0  . prochlorperazine (COMPAZINE) 10 MG tablet Take 1 tablet (10 mg total) by mouth every 6 (six) hours as needed (Nausea or vomiting). 30 tablet 1  . promethazine (PHENERGAN) 25 MG tablet TAKE 1 TABLET BY MOUTH EVERY 6 HOURS AS NEEDED FOR NAUSEA & VOMITING 120 tablet 0  . senna-docusate (SENOKOT-S) 8.6-50 MG tablet Take 1 tablet by mouth at bedtime as needed for mild constipation. (Patient not taking:  No sig reported) 30 tablet 0   No current facility-administered medications for this encounter.   Facility-Administered Medications Ordered in Other Encounters  Medication Dose Route Frequency Provider Last Rate Last Admin  .  HYDROmorphone (DILAUDID) injection 4 mg  4 mg Intravenous Once [name], MD        REVIEW OF SYSTEMS:  On review of systems, the patient reports that he is doing well overall. He denies any chest pain, shortness of breath, cough, fevers, chills, night sweats, or unintended weight changes. He denies any bowel or bladder disturbances, and denies abdominal pain, nausea or vomiting. He denies any new musculoskeletal or joint aches or pains aside from that outlined in the HPI above regarding the left hip. A complete review of systems is obtained and is otherwise negative.    PHYSICAL EXAM:  Wt Readings from Last 3 Encounters:  06/26/20 281 lb (127.5 kg)  06/10/20 284 lb (128.8 kg)  05/20/20 272 lb (123.4 kg)   Temp Readings from Last 3 Encounters:  06/28/20 98.1 F (36.7 C) (Oral)  06/26/20 98.3 F (36.8 C) (Oral)  06/10/20 98 F (36.7 C) (Oral)   BP Readings from Last 3 Encounters:  06/28/20 119/89  06/28/20 (!) 142/99  06/26/20 115/81   Pulse Readings from Last 3 Encounters:  06/28/20 (!) 109  06/28/20 (!) 118  06/26/20 (!) 128    /10  In general this is a well appearing Caucasian male in no acute distress. He is alert and oriented x4 and appropriate throughout the examination. HEENT reveals that the patient is normocephalic, atraumatic. EOMs are intact. PERRLA. Skin is intact without any evidence of gross lesions. Cardiopulmonary assessment is negative for acute distress and he exhibits normal effort. The abdomen is soft, non tender, non distended. Lower extremities are negative for pretibial pitting edema, deep calf tenderness, cyanosis or clubbing.   KPS = 80  100 - Normal; no complaints; no evidence of disease. 90   - Able to carry on normal  activity; minor signs or symptoms of disease. 80   - Normal activity with effort; some signs or symptoms of disease. 47   - Cares for self; unable to carry on normal activity or to do active work. 60   - Requires occasional assistance, but is able to care for most of his personal needs. 50   - Requires considerable assistance and frequent medical care. 61   - Disabled; requires special care and assistance. 25   - Severely disabled; hospital admission is indicated although death not imminent. 70   - Very sick; hospital admission necessary; active supportive treatment necessary. 10   - Moribund; fatal processes progressing rapidly. 0     - Dead  Karnofsky DA, Abelmann [place], Craver LS and Burchenal [facility] [phone]) The use of the nitrogen mustards in the palliative treatment of carcinoma: with particular reference to bronchogenic carcinoma Cancer 1 634-56  LABORATORY DATA:  Lab Results  Component Value Date   WBC 2.8 (L) 06/28/2020   HGB 9.8 (L) 06/28/2020   HCT 29.3 (L) 06/28/2020   MCV 88.0 06/28/2020   PLT 206 06/28/2020   Lab Results  Component Value Date   NA 134 (L) 06/28/2020   K 3.4 (L) 06/28/2020   CL 96 (L) 06/28/2020   CO2 31 06/28/2020   Lab Results  Component Value Date   ALT 21 06/28/2020   AST 19 06/28/2020   ALKPHOS 105 06/28/2020   BILITOT 0.5 06/28/2020     RADIOGRAPHY: No results found.    IMPRESSION/PLAN: 1. 45 y.o. 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  Today, we talked to the patient about  the findings and workup thus far. We discussed the natural history of metastatic thyroid carcinoma and general treatment, highlighting the role of palliative radiotherapy in the management. We discussed the available radiation techniques, and focused on the details and logistics of delivery.  The recommendation is to proceed with a 10 fraction course of daily palliative radiotherapy directed to  the new lytic lesion in the left ilium.  We reviewed the anticipated acute and late sequelae associated with radiation in this setting. The patient was encouraged to ask questions that were answered to his stated satisfaction.  At the conclusion of our conversation, the patient is in agreement to proceed with the recommended 2-week course of daily palliative radiotherapy. He has freely signed written consent to proceed today and a copy of this document will be placed in his medical record.  We will share our discussion with Dr. Marin Olp and proceed with treatment planning accordingly. He is scheduled for CT simulation/treatment planning following our visit this morning, in anticipation of beginning his treatments on Monday, 07/08/20. We enjoyed meeting him today and look forward to continuing to participate in his care.  We personally spent 60 minutes in this encounter including chart review, reviewing radiological studies, meeting face-to-face with the patient, entering orders and completing documentation.    Nicholos Johns, PA-C    Tyler Pita, MD  [facility] Direct Dial: [phone]  Fax: [phone] [email or web address]  Skype  LinkedIn

## 2020-07-05 NOTE — Progress Notes (Signed)
Patient in for consult for metastatic cancer to the left hip. Patient has continual pain 4/10 while sitting with and increase up to 9/10 when getting up and moving around. Patient is aware of Ct Sim post consult. No issues at this time.

## 2020-07-05 NOTE — Progress Notes (Incomplete)
Radiation Oncology         [phone] ________________________________  Name: Brian [name] MRN: [id number]  Date: 07/05/2020  DOB: 25-Sep-1975  Re-Evaluation  CC: Sharyne Richters, MD  [name], MD  Diagnosis:   45 year old male with progressive metastatic thyroid cancer ***     Interval Since Last Radiation:  2 months, 2 weeks  04/15/20 - 04/26/20:  The liver nodule and portacaval lymph node were treated to a prescription dose of 30 Gy in 10 fractions of 3 Gy each.  Narrative:  The patient returns today for re-evaluation. We initially met the patient in the hospital on 04/12/20. In summary, he has a history of aggressive, Hurthle cell carcinoma of the thyroid and has been on multiple courses of systemic therapy under the care and direction of Dr. Marin Olp. He was admitted on 04/11/20 with worsening abdominal pain. CT A/P revealed interval enlargement of a metastatic liver lesion and a portacaval lymph node. We evaluated him and subsequently treated the two areas of metastasis.  Since his last visit, he was started on Gavreto on 05/17/20. He underwent restaging PET scan on 06/14/20 at [facility] revealing: FDG-avid portacaval adenopathy with central necrosis, right upper lobe lung nodule, bilobar hepatic, and osseous metastases. Specifically, there was also an FDG-avid lytic lesion in the left ilium with soft tissue extension. This lesion was also seen on CT C/A/P on 06/15/20, measuring 3 cm.   He was taken off Gavreto on 06/16/20 and switched to pembrolizimab on 06/26/20 on recommendation from his oncologist at [facility].  ALLERGIES:  is allergic to dextromethorphan-guaifenesin, doxycycline, gadobutrol, gadolinium derivatives, guaifenesin, ibuprofen, lisinopril, pantoprazole, tramadol, barium, and chlorhexidine.  Meds: Current Outpatient Medications  Medication Sig Dispense Refill  . apixaban (ELIQUIS) 5 MG TABS tablet TAKE 1 TABLET (5 MG TOTAL) BY MOUTH 2 (TWO) TIMES DAILY. 60 tablet 6  .  baclofen (LIORESAL) 10 MG tablet Take 1 tablet (10 mg total) by mouth 3 (three) times daily. (Patient not taking: Reported on 06/28/2020) 90 each 0  . cyclobenzaprine (FLEXERIL) 10 MG tablet Take 1 tablet (10 mg total) by mouth 3 (three) times daily as needed. (Patient not taking: Reported on 06/28/2020) 90 tablet 1  . dexamethasone (DECADRON) 4 MG tablet TAKE 2 TABLETS (8 MG TOTAL) BY MOUTH DAILY. START THE DAY AFTER CHEMOTHERAPY FOR 2 DAYS. (Patient not taking: No sig reported) 30 tablet 1  . diphenhydrAMINE (BENADRYL) 25 mg capsule TAKE 1 CAPSULE BY MOUTH 1 HOUR BEFORE CT SCAN (Patient not taking: Reported on 06/28/2020) 100 capsule 0  . dronabinol (MARINOL) 5 MG capsule TAKE 1 CAPSULE (5 MG TOTAL) BY MOUTH 2 (TWO) TIMES DAILY BEFORE A MEAL. (Patient not taking: Reported on 06/28/2020) 60 capsule 0  . EPINEPHrine (EPIPEN 2-PAK) 0.3 mg/0.3 mL IJ SOAJ injection USE AS DIRECTED FOR LIFE THREATENING ALLERGIC REACTIONS (Patient not taking: Reported on 06/28/2020) 4 Device 3  . famotidine (PEPCID) 40 MG tablet Take 1 tablet (40 mg total) by mouth 2 (two) times daily. 60 tablet 4  . furosemide (LASIX) 80 MG tablet TAKE 1 TABLET (80 MG TOTAL) BY MOUTH DAILY. (Patient not taking: Reported on 06/28/2020) 30 tablet 1  . gabapentin (NEURONTIN) 300 MG capsule TAKE 1 CAPSULE BY MOUTH THREE TIMES DAILY 90 capsule 2  . HYDROmorphone (DILAUDID) 4 MG tablet Take one to two tablets by mouth every four hours as needed for pain (Patient taking differently: Take one to two tablets by mouth every four hours as needed for pain) 60  tablet 0  . Iron-FA-B Cmp-C-Biot-Probiotic (FUSION PLUS) CAPS Take 1 capsule by mouth daily. (Patient not taking: No sig reported) 30 capsule 6  . levothyroxine (SYNTHROID) 200 MCG tablet Take one tablet (200 mcg dose) by mouth daily. Total:  288 mcg/day. 90 tablet 3  . levothyroxine (SYNTHROID) 88 MCG tablet Take one tablet (88 mcg dose) by mouth daily. Total:  288 mcg/day. 90 tablet 3  . LORazepam  (ATIVAN) 1 MG tablet Take 1 tablet (1 mg total) by mouth every 6 (six) hours as needed for anxiety. 30 tablet 0  . losartan (COZAAR) 25 MG tablet Take 25 mg by mouth as needed. (Patient not taking: Reported on 06/28/2020)    . methylphenidate (RITALIN) 10 MG tablet Take 1 tablet (10 mg total) by mouth 2 (two) times daily. (Patient not taking: Reported on 06/28/2020) 60 tablet 0  . metoCLOPramide (REGLAN) 10 MG tablet Take 1 tablet (10 mg total) by mouth every 6 (six) hours as needed for nausea or vomiting. (Patient not taking: Reported on 06/28/2020) 30 tablet 0  . metolazone (ZAROXOLYN) 5 MG tablet TAKE 1 TABLET (5 MG TOTAL) BY MOUTH DAILY. TAKE 1 HOUR BEFORE LASIX. (Patient not taking: Reported on 06/28/2020) 30 tablet 1  . Multiple Vitamin (MULTIVITAMIN) capsule Take 1 capsule by mouth daily.    Marland Kitchen OLANZapine (ZYPREXA) 10 MG tablet TAKE 1 TABLET (10 MG TOTAL) BY MOUTH AT BEDTIME. 30 tablet 4  . ondansetron (ZOFRAN) 8 MG tablet Take 1 tablet (8 mg total) by mouth 2 (two) times daily as needed (Nausea or vomiting). 30 tablet 1  . oxyCODONE 30 MG 12 hr tablet Take 1 tablet (30 mg total) by mouth every 8 (eight) hours. 90 tablet 0  . polyethylene glycol (MIRALAX / GLYCOLAX) 17 g packet Take 17 g by mouth daily as needed for mild constipation. (Patient not taking: Reported on 06/28/2020) 14 each 0  . potassium chloride (KLOR-CON) 10 MEQ tablet TAKE 1 TABLET BY MOUTH ONCE DAILY (Patient not taking: No sig reported) 30 tablet 1  . predniSONE (DELTASONE) 50 MG tablet TAKE 1 TABLET BY MOUTH 13 HOURS BEFORE CT SCAN, 1 TABLET 7 HOURS BEFORE CT SCAN, AND 1 TABLET 1 HOUR BEFORE CT SCAN (Patient not taking: No sig reported) 3 tablet 0  . prochlorperazine (COMPAZINE) 10 MG tablet Take 1 tablet (10 mg total) by mouth every 6 (six) hours as needed (Nausea or vomiting). 30 tablet 1  . promethazine (PHENERGAN) 25 MG tablet TAKE 1 TABLET BY MOUTH EVERY 6 HOURS AS NEEDED FOR NAUSEA & VOMITING 120 tablet 0  . senna-docusate  (SENOKOT-S) 8.6-50 MG tablet Take 1 tablet by mouth at bedtime as needed for mild constipation. (Patient not taking: No sig reported) 30 tablet 0   No current facility-administered medications for this encounter.   Facility-Administered Medications Ordered in Other Encounters  Medication Dose Route Frequency Provider Last Rate Last Admin  . HYDROmorphone (DILAUDID) injection 4 mg  4 mg Intravenous Once [name], MD        Physical Findings:  vitals were not taken for this visit.   Karen Kays to assess due to telephone follow-up visit format.  Lab Findings: Lab Results  Component Value Date   WBC 2.8 (L) 06/28/2020   HGB 9.8 (L) 06/28/2020   HCT 29.3 (L) 06/28/2020   MCV 88.0 06/28/2020   PLT 206 06/28/2020     Radiographic Findings: No results found.  Impression/Plan: 58. 45 year old male with progressive metastatic thyroid cancer.   Today,  we talked to the patient about the findings and workup thus far. We discussed the natural history of metastatic thyroid carcinoma and general treatment, highlighting the role of palliative radiotherapy in the management. We discussed the available radiation techniques, and focused on the details and logistics of delivery.  The recommendation is to proceed with a *** fraction course of daily palliative radiotherapy directed to the left iliac lesion.  We reviewed the anticipated acute and late sequelae associated with radiation in this setting. The patient was encouraged to ask questions that were answered to his stated satisfaction.  At the conclusion of our conversation, the patient is in agreement to proceed with the recommended *** course of daily palliative radiotherapy.  He has freely signed written consent to proceed today and a copy of this document will be placed in his medical record.  We will proceed with CT simulation/treatment planning this afternoon, in anticipation of beginning his treatments on ***.    Nicholos Johns, PA-C     Tyler Pita, MD  [facility] Direct Dial: [phone]  Fax: [phone] [email or web address]  Skype  LinkedIn   This document serves as a record of services personally performed by Tyler Pita, MD and Freeman Caldron, PA-C. It was created on their behalf by Wilburn Mylar, a trained medical scribe. The creation of this record is based on the scribe's personal observations and the provider's statements to them. This document has been checked and approved by the attending provider.

## 2020-07-07 NOTE — Progress Notes (Signed)
  Radiation Oncology         [phone] ________________________________  Name: DEXTON ZWILLING MRN: [id number]  Date: 07/05/2020  DOB: 1975-10-20  SIMULATION AND TREATMENT PLANNING NOTE    ICD-10-CM   1. Primary cancer of thyroid with metastasis to other site [facility])  C73     DIAGNOSIS:  45 y.o. 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  NARRATIVE:  The patient was brought to the [place].  Identity was confirmed.  All relevant records and images related to the planned course of therapy were reviewed.  The patient freely provided informed written consent to proceed with treatment after reviewing the details related to the planned course of therapy. The consent form was witnessed and verified by the simulation staff.  Then, the patient was set-up in a stable reproducible  supine position for radiation therapy.  CT images were obtained.  Surface markings were placed.  The CT images were loaded into the planning software.  Then the target and avoidance structures were contoured.  Treatment planning then occurred.  The radiation prescription was entered and confirmed.  Then, I designed and supervised the construction of a total of 3 medically necessary complex treatment devices consisting of leg positioner and MLC apertures to cover the treated hip area.  I have requested : 3D Simulation  I have requested a DVH of the following structures: Rectum, Bladder, femoral heads and target.  PLAN:  The patient will receive 30 Gy in 10 fractions.  ________________________________  Sheral Apley Tammi Klippel, M.D.

## 2020-07-12 DIAGNOSIS — G893 Neoplasm related pain (acute) (chronic): Secondary | ICD-10-CM | POA: Diagnosis not present

## 2020-07-12 DIAGNOSIS — C73 Malignant neoplasm of thyroid gland: Secondary | ICD-10-CM | POA: Diagnosis not present

## 2020-07-12 DIAGNOSIS — C787 Secondary malignant neoplasm of liver and intrahepatic bile duct: Secondary | ICD-10-CM | POA: Diagnosis not present

## 2020-07-12 DIAGNOSIS — C7989 Secondary malignant neoplasm of other specified sites: Secondary | ICD-10-CM | POA: Diagnosis not present

## 2020-07-16 ENCOUNTER — Inpatient Hospital Stay (HOSPITAL_BASED_OUTPATIENT_CLINIC_OR_DEPARTMENT_OTHER): Payer: 59 | Admitting: Hematology & Oncology

## 2020-07-16 ENCOUNTER — Inpatient Hospital Stay: Payer: 59

## 2020-07-16 ENCOUNTER — Encounter: Payer: Self-pay | Admitting: Hematology & Oncology

## 2020-07-16 ENCOUNTER — Other Ambulatory Visit (HOSPITAL_BASED_OUTPATIENT_CLINIC_OR_DEPARTMENT_OTHER): Payer: Self-pay

## 2020-07-16 ENCOUNTER — Other Ambulatory Visit: Payer: Self-pay

## 2020-07-16 ENCOUNTER — Ambulatory Visit
Admission: RE | Admit: 2020-07-16 | Discharge: 2020-07-16 | Disposition: A | Discharge status: Home / Self Care | Payer: 59 | Source: Ambulatory Visit | Attending: Radiation Oncology | Admitting: Radiation Oncology

## 2020-07-16 VITALS — BP 122/81 | HR 99 | Temp 98.3°F | Resp 18 | Wt 275.0 lb

## 2020-07-16 DIAGNOSIS — C73 Malignant neoplasm of thyroid gland: Secondary | ICD-10-CM

## 2020-07-16 DIAGNOSIS — C799 Secondary malignant neoplasm of unspecified site: Secondary | ICD-10-CM | POA: Diagnosis not present

## 2020-07-16 DIAGNOSIS — D509 Iron deficiency anemia, unspecified: Secondary | ICD-10-CM

## 2020-07-16 DIAGNOSIS — G893 Neoplasm related pain (acute) (chronic): Secondary | ICD-10-CM | POA: Diagnosis not present

## 2020-07-16 DIAGNOSIS — C7989 Secondary malignant neoplasm of other specified sites: Secondary | ICD-10-CM | POA: Diagnosis not present

## 2020-07-16 DIAGNOSIS — C787 Secondary malignant neoplasm of liver and intrahepatic bile duct: Secondary | ICD-10-CM

## 2020-07-16 LAB — CBC WITH DIFFERENTIAL (CANCER CENTER ONLY)
Abs Immature Granulocytes: 0.02 10*3/uL (ref 0.00–0.07)
Basophils Absolute: 0 10*3/uL (ref 0.0–0.1)
Basophils Relative: 1 %
Eosinophils Absolute: 0 10*3/uL (ref 0.0–0.5)
Eosinophils Relative: 1 %
HCT: 28.6 % — ABNORMAL LOW (ref 39.0–52.0)
Hemoglobin: 9.3 g/dL — ABNORMAL LOW (ref 13.0–17.0)
Immature Granulocytes: 1 %
Lymphocytes Relative: 16 %
Lymphs Abs: 0.6 10*3/uL — ABNORMAL LOW (ref 0.7–4.0)
MCH: 29.3 pg (ref 26.0–34.0)
MCHC: 32.5 g/dL (ref 30.0–36.0)
MCV: 90.2 fL (ref 80.0–100.0)
Monocytes Absolute: 0.6 10*3/uL (ref 0.1–1.0)
Monocytes Relative: 16 %
Neutro Abs: 2.3 10*3/uL (ref 1.7–7.7)
Neutrophils Relative %: 65 %
Platelet Count: 231 10*3/uL (ref 150–400)
RBC: 3.17 MIL/uL — ABNORMAL LOW (ref 4.22–5.81)
RDW: 17.2 % — ABNORMAL HIGH (ref 11.5–15.5)
WBC Count: 3.5 10*3/uL — ABNORMAL LOW (ref 4.0–10.5)
nRBC: 0 % (ref 0.0–0.2)

## 2020-07-16 LAB — CMP (CANCER CENTER ONLY)
ALT: 13 U/L (ref 0–44)
AST: 16 U/L (ref 15–41)
Albumin: 3.9 g/dL (ref 3.5–5.0)
Alkaline Phosphatase: 121 U/L (ref 38–126)
Anion gap: 7 (ref 5–15)
BUN: 17 mg/dL (ref 6–20)
CO2: 28 mmol/L (ref 22–32)
Calcium: 9.8 mg/dL (ref 8.9–10.3)
Chloride: 102 mmol/L (ref 98–111)
Creatinine: 0.76 mg/dL (ref 0.61–1.24)
GFR, Estimated: >60 mL/min (ref >60)
Glucose, Bld: 96 mg/dL (ref 70–99)
Potassium: 4.3 mmol/L (ref 3.5–5.1)
Sodium: 137 mmol/L (ref 135–145)
Total Bilirubin: 0.4 mg/dL (ref 0.3–1.2)
Total Protein: 6.6 g/dL (ref 6.5–8.1)

## 2020-07-16 LAB — TSH: TSH: 0.375 u[IU]/mL (ref 0.320–4.118)

## 2020-07-16 LAB — LACTATE DEHYDROGENASE: LDH: 197 U/L — ABNORMAL HIGH (ref 98–192)

## 2020-07-16 MED ORDER — OXYCODONE HCL ER 30 MG PO T12A
30.0000 mg | EXTENDED_RELEASE_TABLET | Freq: Three times a day (TID) | ORAL | 0 refills | Status: DC
  Filled 2020-07-16 – 2020-07-24 (×4): qty 90, 30d supply, fill #0

## 2020-07-16 MED ORDER — SODIUM CHLORIDE 0.9 % IV SOLN
200.0000 mg | Freq: Once | INTRAVENOUS | Status: AC
  Administered 2020-07-16: 200 mg via INTRAVENOUS
  Filled 2020-07-16: qty 8

## 2020-07-16 MED ORDER — SODIUM CHLORIDE 0.9% FLUSH
10.0000 mL | INTRAVENOUS | Status: DC | PRN
  Administered 2020-07-16: 10 mL
  Filled 2020-07-16: qty 10

## 2020-07-16 MED ORDER — SODIUM CHLORIDE 0.9 % IV SOLN
Freq: Once | INTRAVENOUS | Status: AC
  Filled 2020-07-16: qty 250

## 2020-07-16 MED ORDER — ZOLEDRONIC ACID 4 MG/100ML IV SOLN
4.0000 mg | Freq: Once | INTRAVENOUS | Status: AC
  Administered 2020-07-16: 4 mg via INTRAVENOUS
  Filled 2020-07-16: qty 100

## 2020-07-16 MED ORDER — HEPARIN SOD (PORK) LOCK FLUSH 100 UNIT/ML IV SOLN
500.0000 [IU] | Freq: Once | INTRAVENOUS | Status: AC | PRN
  Administered 2020-07-16: 500 [IU]
  Filled 2020-07-16: qty 5

## 2020-07-16 MED ORDER — HYDROMORPHONE HCL 4 MG PO TABS
ORAL_TABLET | ORAL | 0 refills | Status: DC
  Filled 2020-07-16: qty 60, 5d supply, fill #0

## 2020-07-16 NOTE — Progress Notes (Signed)
Hematology and Oncology Follow Up Visit  Brian [name] [id number] 02/25/1975 45 y.o. 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 + Bilateral pulmonary emboli without right heart strain  DVT left popliteal vein   Past Therapy: Cabometyx 40 mg po q day -- start on 04/17/2019 - d/c on 07/17/2019 for progression Lenvima 24 mg po q day -- on hold due to proteinuria Taxotere/CDDP -- s/p cycle 4- started on 09/13/2019  Current Therapy: Gavreto 400 mg po q day -- started on 05/17/2020-- d/c on 06/16/2020 Adriamycin weekly 2 weeks on 1 week off - s/p cycle8 --d/c on 04/16/2020 due to progression XRT for LEFT hip met Pembrolizumab 200 mg IV q 3 week -- s/p cycle #1 - start on 06/26/2020 Zometa 4 mg IV q 3 months - start on 07/17/2020 Eliquis 5 mg po BID   Interim History:  [name] is here today for follow-up.  He is doing pretty well.  His first cycle of pembrolizumab.  He tolerated this pretty well.  He had a little bit of nausea with it.  His main problem is been some pain in the left hip.  He will start radiation therapy for this today.  He has had no problems with bleeding.  He has had no cough or shortness of breath.  His abdominal pain seems to be doing little bit better.  He has had no problems with leg swelling.  He is on Eliquis.  He is doing well with the Eliquis.  There is been no bleeding.  He has had no rashes.  Overall, I would have to say that his performance status is by ECOG 1.    Medications:  Allergies as of 07/16/2020      Reactions   Dextromethorphan-guaifenesin Other (See Comments)   Irregular heartbeat   Doxycycline Anaphylaxis, Nausea And Vomiting, Rash, Other (See Comments)   "heart arrythmia" and "dyspepsia" (only oral doxycycline causes reaction)   Gadobutrol Hives, Other (See Comments)   Patient had MRI scan at [place]. Patient called one hour after he left imaging  facility to report two "blisters" that came up on "back" of lip.    Gadolinium Derivatives Hives   Patient had MRI scan at [place]. Patient called one hour after he left imaging facility to report two "blisters" that came up on "back" of lip.    Guaifenesin Palpitations      Ibuprofen Hives, Itching   Lisinopril Other (See Comments)   Angioedema   Pantoprazole Itching   Tramadol Hives, Itching   Barium Rash   Developed redness around neck after drinking 1st bottle of Barocat; pt was given Benedryl by ED Mds to be "on the safe side" before drinking 2nd bottle   Chlorhexidine Hives      Medication List       Accurate as of Jul 16, 2020  8:57 AM. If you have any questions, ask your nurse or doctor.        Banophen 25 mg capsule Generic drug: diphenhydrAMINE TAKE 1 CAPSULE BY MOUTH 1 HOUR BEFORE CT SCAN   cyclobenzaprine 10 MG tablet Commonly known as: FLEXERIL Take 1 tablet (10 mg total) by mouth 3 (three) times daily as needed.   dronabinol 5 MG capsule Commonly known as: MARINOL TAKE 1 CAPSULE (5 MG TOTAL) BY MOUTH 2 (TWO) TIMES DAILY BEFORE A MEAL.   Eliquis 5 MG Tabs tablet Generic drug: apixaban TAKE 1 TABLET (5 MG  TOTAL) BY MOUTH 2 (TWO) TIMES DAILY.   EPINEPHrine 0.3 mg/0.3 mL Soaj injection Commonly known as: EpiPen 2-Pak USE AS DIRECTED FOR LIFE THREATENING ALLERGIC REACTIONS   famotidine 40 MG tablet Commonly known as: PEPCID Take 1 tablet (40 mg total) by mouth 2 (two) times daily.   furosemide 80 MG tablet Commonly known as: LASIX TAKE 1 TABLET (80 MG TOTAL) BY MOUTH DAILY.   Fusion Plus Caps Take 1 capsule by mouth daily.   gabapentin 300 MG capsule Commonly known as: NEURONTIN TAKE 1 CAPSULE BY MOUTH THREE TIMES DAILY   HYDROmorphone 4 MG tablet Commonly known as: DILAUDID Take one to two tablets by mouth every four hours as needed for pain   levothyroxine 200 MCG tablet Commonly known as: SYNTHROID Take one tablet (200 mcg dose) by  mouth daily. Total:  288 mcg/day.   levothyroxine 88 MCG tablet Commonly known as: SYNTHROID Take one tablet (88 mcg dose) by mouth daily. Total:  288 mcg/day.   LORazepam 1 MG tablet Commonly known as: ATIVAN Take 1 tablet (1 mg total) by mouth every 6 (six) hours as needed for anxiety.   losartan 25 MG tablet Commonly known as: COZAAR Take 25 mg by mouth as needed.   methylphenidate 10 MG tablet Commonly known as: RITALIN Take 1 tablet (10 mg total) by mouth 2 (two) times daily.   metoCLOPramide 10 MG tablet Commonly known as: REGLAN Take 1 tablet (10 mg total) by mouth every 6 (six) hours as needed for nausea or vomiting.   metolazone 5 MG tablet Commonly known as: ZAROXOLYN TAKE 1 TABLET (5 MG TOTAL) BY MOUTH DAILY. TAKE 1 HOUR BEFORE LASIX.   multivitamin capsule Take 1 capsule by mouth daily.   OLANZapine 10 MG tablet Commonly known as: ZYPREXA TAKE 1 TABLET (10 MG TOTAL) BY MOUTH AT BEDTIME.   ondansetron 8 MG tablet Commonly known as: Zofran Take 1 tablet (8 mg total) by mouth 2 (two) times daily as needed (Nausea or vomiting).   OxyCONTIN 30 MG 12 hr tablet Generic drug: oxyCODONE Take 1 tablet (30 mg total) by mouth every 8 (eight) hours.   polyethylene glycol 17 g packet Commonly known as: MIRALAX / GLYCOLAX Take 17 g by mouth daily as needed for mild constipation.   potassium chloride 10 MEQ tablet Commonly known as: KLOR-CON TAKE 1 TABLET BY MOUTH ONCE DAILY   predniSONE 50 MG tablet Commonly known as: DELTASONE TAKE 1 TABLET BY MOUTH 13 HOURS BEFORE CT SCAN, 1 TABLET 7 HOURS BEFORE CT SCAN, AND 1 TABLET 1 HOUR BEFORE CT SCAN   prochlorperazine 10 MG tablet Commonly known as: COMPAZINE Take 1 tablet (10 mg total) by mouth every 6 (six) hours as needed (Nausea or vomiting).   promethazine 25 MG tablet Commonly known as: PHENERGAN TAKE 1 TABLET BY MOUTH EVERY 6 HOURS AS NEEDED FOR NAUSEA & VOMITING   senna-docusate 8.6-50 MG tablet Commonly  known as: Senokot-S Take 1 tablet by mouth at bedtime as needed for mild constipation.       Allergies:  Allergies  Allergen Reactions  . Dextromethorphan-Guaifenesin Other (See Comments)    Irregular heartbeat   . Doxycycline Anaphylaxis, Nausea And Vomiting, Rash and Other (See Comments)    "heart arrythmia" and "dyspepsia" (only oral doxycycline causes reaction)  . Gadobutrol Hives and Other (See Comments)    Patient had MRI scan at [place]. Patient called one hour after he left imaging facility to report two "blisters" that came up on "  back" of lip.    . Gadolinium Derivatives Hives    Patient had MRI scan at [place]. Patient called one hour after he left imaging facility to report two "blisters" that came up on "back" of lip.   . Guaifenesin Palpitations       . Ibuprofen Hives and Itching  . Lisinopril Other (See Comments)    Angioedema  . Pantoprazole Itching  . Tramadol Hives and Itching  . Barium Rash    Developed redness around neck after drinking 1st bottle of Barocat; pt was given Benedryl by ED Mds to be "on the safe side" before drinking 2nd bottle  . Chlorhexidine Hives    Past Medical History, Surgical history, Social history, and Family History were reviewed and updated.  Review of Systems: Review of Systems  Constitutional: Positive for malaise/fatigue.  HENT: Negative.   Eyes: Negative.   Respiratory: Negative.   Cardiovascular: Negative.   Gastrointestinal: Positive for abdominal pain, constipation, heartburn and nausea.  Genitourinary: Negative.   Musculoskeletal: Positive for joint pain.  Skin: Negative.   Neurological: Negative.   Endo/Heme/Allergies: Negative.   Psychiatric/Behavioral: Negative.      Physical Exam:  weight is 275 lb (124.7 kg). His oral temperature is 98.3 F (36.8 C). His blood pressure is 122/81 and his pulse is 99. His respiration is 18 and oxygen saturation is 99%.   Wt Readings from Last 3  Encounters:  07/16/20 275 lb (124.7 kg)  07/16/20 275 lb (124.7 kg)  07/05/20 275 lb 3.2 oz (124.8 kg)    Physical Exam Vitals reviewed.  HENT:     Head: Normocephalic and atraumatic.  Eyes:     Pupils: Pupils are equal, round, and reactive to light.  Cardiovascular:     Rate and Rhythm: Normal rate and regular rhythm.     Heart sounds: Normal heart sounds.  Pulmonary:     Effort: Pulmonary effort is normal.     Breath sounds: Normal breath sounds.  Abdominal:     General: Bowel sounds are normal.     Palpations: Abdomen is soft.  Musculoskeletal:        General: No tenderness or deformity. Normal range of motion.     Cervical back: Normal range of motion.  Lymphadenopathy:     Cervical: No cervical adenopathy.  Skin:    General: Skin is warm and dry.     Findings: No erythema or rash.  Neurological:     Mental Status: He is alert and oriented to person, place, and time.  Psychiatric:        Behavior: Behavior normal.        Thought Content: Thought content normal.        Judgment: Judgment normal.      Lab Results  Component Value Date   WBC 3.5 (L) 07/16/2020   HGB 9.3 (L) 07/16/2020   HCT 28.6 (L) 07/16/2020   MCV 90.2 07/16/2020   PLT 231 07/16/2020   Lab Results  Component Value Date   FERRITIN 1,969 (H) 06/26/2020   IRON 59 06/26/2020   TIBC 223 06/26/2020   UIBC 163 06/26/2020   IRONPCTSAT 27 06/26/2020   Lab Results  Component Value Date   RETICCTPCT 2.2 06/10/2020   RBC 3.17 (L) 07/16/2020   No results found for: KPAFRELGTCHN, LAMBDASER, KAPLAMBRATIO No results found for: IGGSERUM, IGA, IGMSERUM No results found for: TOTALPROTELP, ALBUMINELP, A1GS, A2GS, BETS, BETA2SER, GAMS, MSPIKE, SPEI   Chemistry      Component Value Date/Time  NA 137 07/16/2020 0828   NA 141 02/06/2019 1210   K 4.3 07/16/2020 0828   CL 102 07/16/2020 0828   CO2 28 07/16/2020 0828   BUN 17 07/16/2020 0828   BUN 8 02/06/2019 1210   CREATININE 0.76 07/16/2020 0828       Component Value Date/Time   CALCIUM 9.8 07/16/2020 0828   ALKPHOS 121 07/16/2020 0828   AST 16 07/16/2020 0828   ALT 13 07/16/2020 0828   BILITOT 0.4 07/16/2020 0828       Impression and Plan: 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  He now is on immunotherapy.  Hopefully, we will see a response to immunotherapy.  It is still a bit too early to know if he is going to respond.  We will start him on Zometa today.  He will have the radiation therapy start today.  I am just happy that his quality of life seems to be doing little bit better.  He did have an issue a couple weeks ago with respect to mental status changes.  I think this was probably from his pain medications and gabapentin.  We will plan to get her back to see [place] in another 3 weeks.   [name], MD 5/31/20228:57 AM

## 2020-07-16 NOTE — Patient Instructions (Signed)

## 2020-07-16 NOTE — Patient Instructions (Signed)
[facility] [place]  Discharge Instructions: Thank you for choosing [facility] to provide your oncology and hematology care.   If you have a lab appointment with the [facility], please go directly to the [facility] and check in at the registration area.  Wear comfortable clothing and clothing appropriate for easy access to any Portacath or PICC line.   We strive to give you quality time with your provider. You may need to reschedule your appointment if you arrive late (15 or more minutes).  Arriving late affects you and other patients whose appointments are after yours.  Also, if you miss three or more appointments without notifying the office, you may be dismissed from the clinic at the provider's discretion.      For prescription refill requests, have your pharmacy contact our office and allow 72 hours for refills to be completed.    Today you received the following chemotherapy and/or immunotherapy agents Keytruda   To help prevent nausea and vomiting after your treatment, we encourage you to take your nausea medication as directed.  BELOW ARE SYMPTOMS THAT SHOULD BE REPORTED IMMEDIATELY: . *FEVER GREATER THAN 100.4 F (38 C) OR HIGHER . *CHILLS OR SWEATING . *NAUSEA AND VOMITING THAT IS NOT CONTROLLED WITH YOUR NAUSEA MEDICATION . *UNUSUAL SHORTNESS OF BREATH . *UNUSUAL BRUISING OR BLEEDING . *URINARY PROBLEMS (pain or burning when urinating, or frequent urination) . *BOWEL PROBLEMS (unusual diarrhea, constipation, pain near the anus) . TENDERNESS IN MOUTH AND THROAT WITH OR WITHOUT PRESENCE OF ULCERS (sore throat, sores in mouth, or a toothache) . UNUSUAL RASH, SWELLING OR PAIN  . UNUSUAL VAGINAL DISCHARGE OR ITCHING   Items with * indicate a potential emergency and should be followed up as soon as possible or go to the Emergency Department if any problems should occur.  Please show the CHEMOTHERAPY ALERT CARD or IMMUNOTHERAPY ALERT CARD at  check-in to the Emergency Department and triage nurse. Should you have questions after your visit or need to cancel or reschedule your appointment, please contact [facility]  [phone] and follow the prompts.  Office hours are 8:00 a.m. to 4:30 p.m. Monday - Friday. Please note that voicemails left after 4:00 p.m. may not be returned until the following business day.  We are closed weekends and major holidays. You have access to a nurse at all times for urgent questions. Please call the main number to the clinic [phone] and follow the prompts.  For any non-urgent questions, you may also contact your provider using MyChart. We now offer e-Visits for anyone 45 and older to request care online for non-urgent symptoms. For details visit [email or web address].   Also download the MyChart app! Go to the app store, search "MyChart", open the app, select [facility], and log in with your MyChart username and password.  Due to Covid, a mask is required upon entering the hospital/clinic. If you do not have a mask, one will be given to you upon arrival. For doctor visits, patients may have 1 support person aged 45 or older with them. For treatment visits, patients cannot have anyone with them due to current Covid guidelines and our immunocompromised population.

## 2020-07-17 ENCOUNTER — Other Ambulatory Visit (HOSPITAL_BASED_OUTPATIENT_CLINIC_OR_DEPARTMENT_OTHER): Payer: Self-pay

## 2020-07-17 ENCOUNTER — Ambulatory Visit: Payer: 59

## 2020-07-17 ENCOUNTER — Other Ambulatory Visit: Payer: 59

## 2020-07-17 ENCOUNTER — Ambulatory Visit
Admission: RE | Admit: 2020-07-17 | Discharge: 2020-07-17 | Disposition: A | Discharge status: Home / Self Care | Payer: 59 | Source: Ambulatory Visit | Attending: Radiation Oncology | Admitting: Radiation Oncology

## 2020-07-17 ENCOUNTER — Ambulatory Visit: Payer: 59 | Admitting: Hematology & Oncology

## 2020-07-17 ENCOUNTER — Encounter: Payer: Self-pay | Admitting: Family

## 2020-07-17 ENCOUNTER — Encounter: Payer: Self-pay | Admitting: Hematology & Oncology

## 2020-07-17 DIAGNOSIS — G893 Neoplasm related pain (acute) (chronic): Secondary | ICD-10-CM | POA: Diagnosis not present

## 2020-07-17 DIAGNOSIS — C73 Malignant neoplasm of thyroid gland: Secondary | ICD-10-CM | POA: Diagnosis not present

## 2020-07-17 DIAGNOSIS — C787 Secondary malignant neoplasm of liver and intrahepatic bile duct: Secondary | ICD-10-CM | POA: Diagnosis not present

## 2020-07-17 DIAGNOSIS — C7989 Secondary malignant neoplasm of other specified sites: Secondary | ICD-10-CM | POA: Diagnosis not present

## 2020-07-18 ENCOUNTER — Other Ambulatory Visit (HOSPITAL_BASED_OUTPATIENT_CLINIC_OR_DEPARTMENT_OTHER): Payer: Self-pay

## 2020-07-18 ENCOUNTER — Other Ambulatory Visit: Payer: Self-pay

## 2020-07-18 ENCOUNTER — Ambulatory Visit
Admission: RE | Admit: 2020-07-18 | Discharge: 2020-07-18 | Disposition: A | Discharge status: Home / Self Care | Payer: 59 | Source: Ambulatory Visit | Attending: Radiation Oncology | Admitting: Radiation Oncology

## 2020-07-18 DIAGNOSIS — C787 Secondary malignant neoplasm of liver and intrahepatic bile duct: Secondary | ICD-10-CM | POA: Diagnosis not present

## 2020-07-18 DIAGNOSIS — C73 Malignant neoplasm of thyroid gland: Secondary | ICD-10-CM | POA: Diagnosis not present

## 2020-07-18 DIAGNOSIS — G893 Neoplasm related pain (acute) (chronic): Secondary | ICD-10-CM | POA: Diagnosis not present

## 2020-07-18 DIAGNOSIS — C7989 Secondary malignant neoplasm of other specified sites: Secondary | ICD-10-CM | POA: Diagnosis not present

## 2020-07-19 ENCOUNTER — Ambulatory Visit
Admission: RE | Admit: 2020-07-19 | Discharge: 2020-07-19 | Disposition: A | Discharge status: Home / Self Care | Payer: 59 | Source: Ambulatory Visit | Attending: Radiation Oncology | Admitting: Radiation Oncology

## 2020-07-19 DIAGNOSIS — C787 Secondary malignant neoplasm of liver and intrahepatic bile duct: Secondary | ICD-10-CM | POA: Diagnosis not present

## 2020-07-19 DIAGNOSIS — C73 Malignant neoplasm of thyroid gland: Secondary | ICD-10-CM | POA: Diagnosis not present

## 2020-07-19 DIAGNOSIS — C7989 Secondary malignant neoplasm of other specified sites: Secondary | ICD-10-CM | POA: Diagnosis not present

## 2020-07-19 DIAGNOSIS — G893 Neoplasm related pain (acute) (chronic): Secondary | ICD-10-CM | POA: Diagnosis not present

## 2020-07-22 ENCOUNTER — Encounter: Payer: Self-pay | Admitting: Family

## 2020-07-22 ENCOUNTER — Ambulatory Visit
Admission: RE | Admit: 2020-07-22 | Discharge: 2020-07-22 | Disposition: A | Discharge status: Home / Self Care | Payer: 59 | Source: Ambulatory Visit | Attending: Radiation Oncology | Admitting: Radiation Oncology

## 2020-07-22 ENCOUNTER — Other Ambulatory Visit: Payer: Self-pay

## 2020-07-22 ENCOUNTER — Other Ambulatory Visit (HOSPITAL_BASED_OUTPATIENT_CLINIC_OR_DEPARTMENT_OTHER): Payer: Self-pay

## 2020-07-22 ENCOUNTER — Encounter: Payer: Self-pay | Admitting: Hematology & Oncology

## 2020-07-22 DIAGNOSIS — G893 Neoplasm related pain (acute) (chronic): Secondary | ICD-10-CM | POA: Diagnosis not present

## 2020-07-22 DIAGNOSIS — C73 Malignant neoplasm of thyroid gland: Secondary | ICD-10-CM | POA: Diagnosis not present

## 2020-07-22 DIAGNOSIS — C7989 Secondary malignant neoplasm of other specified sites: Secondary | ICD-10-CM | POA: Diagnosis not present

## 2020-07-22 DIAGNOSIS — C787 Secondary malignant neoplasm of liver and intrahepatic bile duct: Secondary | ICD-10-CM | POA: Diagnosis not present

## 2020-07-23 ENCOUNTER — Ambulatory Visit
Admission: RE | Admit: 2020-07-23 | Discharge: 2020-07-23 | Disposition: A | Discharge status: Home / Self Care | Payer: 59 | Source: Ambulatory Visit | Attending: Radiation Oncology | Admitting: Radiation Oncology

## 2020-07-23 DIAGNOSIS — G893 Neoplasm related pain (acute) (chronic): Secondary | ICD-10-CM | POA: Diagnosis not present

## 2020-07-23 DIAGNOSIS — C787 Secondary malignant neoplasm of liver and intrahepatic bile duct: Secondary | ICD-10-CM | POA: Diagnosis not present

## 2020-07-23 DIAGNOSIS — C73 Malignant neoplasm of thyroid gland: Secondary | ICD-10-CM | POA: Diagnosis not present

## 2020-07-23 DIAGNOSIS — C7989 Secondary malignant neoplasm of other specified sites: Secondary | ICD-10-CM | POA: Diagnosis not present

## 2020-07-24 ENCOUNTER — Other Ambulatory Visit: Payer: Self-pay

## 2020-07-24 ENCOUNTER — Other Ambulatory Visit (HOSPITAL_BASED_OUTPATIENT_CLINIC_OR_DEPARTMENT_OTHER): Payer: Self-pay

## 2020-07-24 ENCOUNTER — Ambulatory Visit
Admission: RE | Admit: 2020-07-24 | Discharge: 2020-07-24 | Disposition: A | Discharge status: Home / Self Care | Payer: 59 | Source: Ambulatory Visit | Attending: Radiation Oncology | Admitting: Radiation Oncology

## 2020-07-24 DIAGNOSIS — G893 Neoplasm related pain (acute) (chronic): Secondary | ICD-10-CM | POA: Diagnosis not present

## 2020-07-24 DIAGNOSIS — C73 Malignant neoplasm of thyroid gland: Secondary | ICD-10-CM | POA: Diagnosis not present

## 2020-07-24 DIAGNOSIS — C787 Secondary malignant neoplasm of liver and intrahepatic bile duct: Secondary | ICD-10-CM | POA: Diagnosis not present

## 2020-07-24 DIAGNOSIS — C7989 Secondary malignant neoplasm of other specified sites: Secondary | ICD-10-CM | POA: Diagnosis not present

## 2020-07-25 ENCOUNTER — Ambulatory Visit
Admission: RE | Admit: 2020-07-25 | Discharge: 2020-07-25 | Disposition: A | Discharge status: Home / Self Care | Payer: 59 | Source: Ambulatory Visit | Attending: Radiation Oncology | Admitting: Radiation Oncology

## 2020-07-25 DIAGNOSIS — C7989 Secondary malignant neoplasm of other specified sites: Secondary | ICD-10-CM | POA: Diagnosis not present

## 2020-07-25 DIAGNOSIS — G893 Neoplasm related pain (acute) (chronic): Secondary | ICD-10-CM | POA: Diagnosis not present

## 2020-07-25 DIAGNOSIS — C787 Secondary malignant neoplasm of liver and intrahepatic bile duct: Secondary | ICD-10-CM | POA: Diagnosis not present

## 2020-07-25 DIAGNOSIS — C73 Malignant neoplasm of thyroid gland: Secondary | ICD-10-CM | POA: Diagnosis not present

## 2020-07-26 ENCOUNTER — Ambulatory Visit
Admission: RE | Admit: 2020-07-26 | Discharge: 2020-07-26 | Disposition: A | Discharge status: Home / Self Care | Payer: 59 | Source: Ambulatory Visit | Attending: Radiation Oncology | Admitting: Radiation Oncology

## 2020-07-26 ENCOUNTER — Other Ambulatory Visit: Payer: Self-pay

## 2020-07-26 ENCOUNTER — Telehealth: Payer: Self-pay

## 2020-07-26 DIAGNOSIS — C787 Secondary malignant neoplasm of liver and intrahepatic bile duct: Secondary | ICD-10-CM | POA: Diagnosis not present

## 2020-07-26 DIAGNOSIS — G893 Neoplasm related pain (acute) (chronic): Secondary | ICD-10-CM | POA: Diagnosis not present

## 2020-07-26 DIAGNOSIS — C7989 Secondary malignant neoplasm of other specified sites: Secondary | ICD-10-CM | POA: Diagnosis not present

## 2020-07-26 DIAGNOSIS — C73 Malignant neoplasm of thyroid gland: Secondary | ICD-10-CM | POA: Diagnosis not present

## 2020-07-29 ENCOUNTER — Other Ambulatory Visit: Payer: Self-pay

## 2020-07-29 ENCOUNTER — Encounter: Payer: Self-pay | Admitting: Urology

## 2020-07-29 ENCOUNTER — Ambulatory Visit
Admission: RE | Admit: 2020-07-29 | Discharge: 2020-07-29 | Disposition: A | Discharge status: Home / Self Care | Payer: 59 | Source: Ambulatory Visit | Attending: Radiation Oncology | Admitting: Radiation Oncology

## 2020-07-29 DIAGNOSIS — C73 Malignant neoplasm of thyroid gland: Secondary | ICD-10-CM | POA: Diagnosis not present

## 2020-07-29 DIAGNOSIS — G893 Neoplasm related pain (acute) (chronic): Secondary | ICD-10-CM | POA: Diagnosis not present

## 2020-07-29 DIAGNOSIS — C787 Secondary malignant neoplasm of liver and intrahepatic bile duct: Secondary | ICD-10-CM | POA: Diagnosis not present

## 2020-07-29 DIAGNOSIS — C7989 Secondary malignant neoplasm of other specified sites: Secondary | ICD-10-CM | POA: Diagnosis not present

## 2020-08-07 ENCOUNTER — Other Ambulatory Visit: Payer: Self-pay | Admitting: Hematology & Oncology

## 2020-08-07 ENCOUNTER — Other Ambulatory Visit (HOSPITAL_BASED_OUTPATIENT_CLINIC_OR_DEPARTMENT_OTHER): Payer: Self-pay

## 2020-08-07 DIAGNOSIS — C73 Malignant neoplasm of thyroid gland: Secondary | ICD-10-CM

## 2020-08-07 MED ORDER — HYDROMORPHONE HCL 4 MG PO TABS
ORAL_TABLET | ORAL | 0 refills | Status: DC
  Filled 2020-08-07: qty 60, 5d supply, fill #0

## 2020-08-07 MED FILL — Apixaban Tab 5 MG: ORAL | 30 days supply | Qty: 60 | Fill #1 | Status: AC

## 2020-08-08 ENCOUNTER — Inpatient Hospital Stay: Payer: 59 | Attending: Hematology & Oncology

## 2020-08-08 ENCOUNTER — Encounter: Payer: Self-pay | Admitting: Hematology & Oncology

## 2020-08-08 ENCOUNTER — Encounter: Payer: Self-pay | Admitting: Family

## 2020-08-08 ENCOUNTER — Inpatient Hospital Stay (HOSPITAL_BASED_OUTPATIENT_CLINIC_OR_DEPARTMENT_OTHER): Payer: 59 | Admitting: Hematology & Oncology

## 2020-08-08 ENCOUNTER — Inpatient Hospital Stay: Payer: 59

## 2020-08-08 ENCOUNTER — Other Ambulatory Visit: Payer: Self-pay

## 2020-08-08 ENCOUNTER — Other Ambulatory Visit (HOSPITAL_BASED_OUTPATIENT_CLINIC_OR_DEPARTMENT_OTHER): Payer: Self-pay

## 2020-08-08 ENCOUNTER — Telehealth: Payer: Self-pay

## 2020-08-08 VITALS — BP 132/82 | HR 80 | Temp 98.7°F | Resp 18 | Wt 275.0 lb

## 2020-08-08 DIAGNOSIS — C73 Malignant neoplasm of thyroid gland: Secondary | ICD-10-CM

## 2020-08-08 DIAGNOSIS — Z5112 Encounter for antineoplastic immunotherapy: Secondary | ICD-10-CM | POA: Diagnosis not present

## 2020-08-08 DIAGNOSIS — C787 Secondary malignant neoplasm of liver and intrahepatic bile duct: Secondary | ICD-10-CM | POA: Diagnosis not present

## 2020-08-08 DIAGNOSIS — D509 Iron deficiency anemia, unspecified: Secondary | ICD-10-CM

## 2020-08-08 DIAGNOSIS — Z79899 Other long term (current) drug therapy: Secondary | ICD-10-CM | POA: Insufficient documentation

## 2020-08-08 DIAGNOSIS — C799 Secondary malignant neoplasm of unspecified site: Secondary | ICD-10-CM

## 2020-08-08 LAB — CBC WITH DIFFERENTIAL (CANCER CENTER ONLY)
Abs Immature Granulocytes: 0.01 10*3/uL (ref 0.00–0.07)
Basophils Absolute: 0 10*3/uL (ref 0.0–0.1)
Basophils Relative: 1 %
Eosinophils Absolute: 0.1 10*3/uL (ref 0.0–0.5)
Eosinophils Relative: 2 %
HCT: 33.2 % — ABNORMAL LOW (ref 39.0–52.0)
Hemoglobin: 10.8 g/dL — ABNORMAL LOW (ref 13.0–17.0)
Immature Granulocytes: 0 %
Lymphocytes Relative: 17 %
Lymphs Abs: 0.5 10*3/uL — ABNORMAL LOW (ref 0.7–4.0)
MCH: 30.1 pg (ref 26.0–34.0)
MCHC: 32.5 g/dL (ref 30.0–36.0)
MCV: 92.5 fL (ref 80.0–100.0)
Monocytes Absolute: 0.3 10*3/uL (ref 0.1–1.0)
Monocytes Relative: 12 %
Neutro Abs: 2 10*3/uL (ref 1.7–7.7)
Neutrophils Relative %: 68 %
Platelet Count: 163 10*3/uL (ref 150–400)
RBC: 3.59 MIL/uL — ABNORMAL LOW (ref 4.22–5.81)
RDW: 15.8 % — ABNORMAL HIGH (ref 11.5–15.5)
WBC Count: 2.9 10*3/uL — ABNORMAL LOW (ref 4.0–10.5)
nRBC: 0 % (ref 0.0–0.2)

## 2020-08-08 LAB — RETICULOCYTES
Immature Retic Fract: 29.5 % — ABNORMAL HIGH (ref 2.3–15.9)
RBC.: 3.63 MIL/uL — ABNORMAL LOW (ref 4.22–5.81)
Retic Count, Absolute: 139.8 10*3/uL (ref 19.0–186.0)
Retic Ct Pct: 3.9 % — ABNORMAL HIGH (ref 0.4–3.1)

## 2020-08-08 LAB — IRON AND TIBC
Iron: 67 ug/dL (ref 42–163)
Saturation Ratios: 31 % (ref 20–55)
TIBC: 214 ug/dL (ref 202–409)
UIBC: 147 ug/dL (ref 117–376)

## 2020-08-08 LAB — SAMPLE TO BLOOD BANK

## 2020-08-08 LAB — CMP (CANCER CENTER ONLY)
ALT: 24 U/L (ref 0–44)
AST: 21 U/L (ref 15–41)
Albumin: 3.8 g/dL (ref 3.5–5.0)
Alkaline Phosphatase: 80 U/L (ref 38–126)
Anion gap: 6 (ref 5–15)
BUN: 13 mg/dL (ref 6–20)
CO2: 29 mmol/L (ref 22–32)
Calcium: 9 mg/dL (ref 8.9–10.3)
Chloride: 105 mmol/L (ref 98–111)
Creatinine: 1 mg/dL (ref 0.61–1.24)
GFR, Estimated: >60 mL/min (ref >60)
Glucose, Bld: 106 mg/dL — ABNORMAL HIGH (ref 70–99)
Potassium: 4 mmol/L (ref 3.5–5.1)
Sodium: 140 mmol/L (ref 135–145)
Total Bilirubin: 0.6 mg/dL (ref 0.3–1.2)
Total Protein: 6.4 g/dL — ABNORMAL LOW (ref 6.5–8.1)

## 2020-08-08 LAB — FERRITIN: Ferritin: 775 ng/mL — ABNORMAL HIGH (ref 24–336)

## 2020-08-08 LAB — LACTATE DEHYDROGENASE: LDH: 194 U/L — ABNORMAL HIGH (ref 98–192)

## 2020-08-08 MED ORDER — HEPARIN SOD (PORK) LOCK FLUSH 100 UNIT/ML IV SOLN
500.0000 [IU] | Freq: Once | INTRAVENOUS | Status: AC | PRN
  Administered 2020-08-08: 500 [IU]
  Filled 2020-08-08: qty 5

## 2020-08-08 MED ORDER — SODIUM CHLORIDE 0.9% FLUSH
10.0000 mL | Freq: Once | INTRAVENOUS | Status: AC | PRN
  Administered 2020-08-08: 10 mL
  Filled 2020-08-08: qty 10

## 2020-08-08 MED ORDER — SODIUM CHLORIDE 0.9 % IV SOLN
Freq: Once | INTRAVENOUS | Status: AC
Start: 2020-08-08 — End: 2020-08-08
  Filled 2020-08-08: qty 250

## 2020-08-08 MED ORDER — SODIUM CHLORIDE 0.9 % IV SOLN
200.0000 mg | Freq: Once | INTRAVENOUS | Status: AC
  Administered 2020-08-08: 200 mg via INTRAVENOUS
  Filled 2020-08-08: qty 8

## 2020-08-08 NOTE — Patient Instructions (Addendum)
[facility] [place]  Discharge Instructions: Thank you for choosing [facility] to provide your oncology and hematology care.   If you have a lab appointment with the [facility], please go directly to the [facility] and check in at the registration area.  Wear comfortable clothing and clothing appropriate for easy access to any Portacath or PICC line.   We strive to give you quality time with your provider. You may need to reschedule your appointment if you arrive late (15 or more minutes).  Arriving late affects you and other patients whose appointments are after yours.  Also, if you miss three or more appointments without notifying the office, you may be dismissed from the clinic at the provider's discretion.      For prescription refill requests, have your pharmacy contact our office and allow 72 hours for refills to be completed.    Today you received the following chemotherapy and/or immunotherapy agents Keytruda   To help prevent nausea and vomiting after your treatment, we encourage you to take your nausea medication as directed.  BELOW ARE SYMPTOMS THAT SHOULD BE REPORTED IMMEDIATELY: *FEVER GREATER THAN 100.4 F (38 C) OR HIGHER *CHILLS OR SWEATING *NAUSEA AND VOMITING THAT IS NOT CONTROLLED WITH YOUR NAUSEA MEDICATION *UNUSUAL SHORTNESS OF BREATH *UNUSUAL BRUISING OR BLEEDING *URINARY PROBLEMS (pain or burning when urinating, or frequent urination) *BOWEL PROBLEMS (unusual diarrhea, constipation, pain near the anus) TENDERNESS IN MOUTH AND THROAT WITH OR WITHOUT PRESENCE OF ULCERS (sore throat, sores in mouth, or a toothache) UNUSUAL RASH, SWELLING OR PAIN  UNUSUAL VAGINAL DISCHARGE OR ITCHING   Items with * indicate a potential emergency and should be followed up as soon as possible or go to the Emergency Department if any problems should occur.  Please show the CHEMOTHERAPY ALERT CARD or IMMUNOTHERAPY ALERT CARD at check-in to the  Emergency Department and triage nurse. Should you have questions after your visit or need to cancel or reschedule your appointment, please contact [facility]  [phone] and follow the prompts.  Office hours are 8:00 a.m. to 4:30 p.m. Monday - Friday. Please note that voicemails left after 4:00 p.m. may not be returned until the following business day.  We are closed weekends and major holidays. You have access to a nurse at all times for urgent questions. Please call the main number to the clinic [phone] and follow the prompts.  For any non-urgent questions, you may also contact your provider using MyChart. We now offer e-Visits for anyone 36 and older to request care online for non-urgent symptoms. For details visit [email or web address].   Also download the MyChart app! Go to the app store, search "MyChart", open the app, select [facility], and log in with your MyChart username and password.  Due to Covid, a mask is required upon entering the hospital/clinic. If you do not have a mask, one will be given to you upon arrival. For doctor visits, patients may have 1 support person aged 72 or older with them. For treatment visits, patients cannot have anyone with them due to current Covid guidelines and our immunocompromised population.

## 2020-08-08 NOTE — Progress Notes (Signed)
Hematology and Oncology Follow Up Visit  Brian [name] [id number] September 12, 1975 45 y.o. 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 + Bilateral pulmonary emboli without right heart strain  DVT left popliteal vein    Past Therapy: Cabometyx 40 mg po q day -- start on 04/17/2019 - d/c on 07/17/2019 for progression Lenvima 24 mg po q day -- on hold due to proteinuria Taxotere/CDDP -- s/p cycle 4 - started on 09/13/2019   Current Therapy:     Gavreto 400 mg po q day -- started on 05/17/2020  -- d/c on 06/16/2020 Adriamycin weekly 2 weeks on 1 week off - s/p cycle 8 --d/c on 04/16/2020 due to progression XRT for LEFT hip met Pembrolizumab 200 mg IV q 3 week -- s/p cycle #2 - start on 06/26/2020 Zometa 4 mg IV q 3 months - next dose on 10/2020 Eliquis 5 mg po BID   Interim History:  [name] is here today for follow-up.  He actually looks better.  He feels okay.  He had a wonderful Father's Day and birthday.  Everything was this past weekend.  [place]  They did enjoy themselves.  His pain is doing much better after radiation to the left hip.  He is able to walk better.  He has little bit of pain in the left hip but again, this is much better.  He is eating better he is having no problems with bowels or bladder.  He is on Eliquis.  He is doing well with the Eliquis.  There is no bleeding.  He has had no cough or shortness of breath.  There is been no rashes.  He is doing well with the pain medication.  He is on a good protocol right now.  Overall, his performance status is ECOG 1.    Medications:  Allergies as of 08/08/2020       Reactions   Dextromethorphan-guaifenesin Other (See Comments)   Irregular heartbeat   Doxycycline Anaphylaxis, Nausea And Vomiting, Rash, Other (See Comments)   "heart arrythmia" and "dyspepsia" (only oral doxycycline causes reaction)   Gadobutrol Hives, Other (See Comments)    Patient had MRI scan at [place]. Patient called one hour after he left imaging facility to report two "blisters" that came up on "back" of lip.    Gadolinium Derivatives Hives   Patient had MRI scan at [place]. Patient called one hour after he left imaging facility to report two "blisters" that came up on "back" of lip.    Guaifenesin Palpitations      Ibuprofen Hives, Itching   Lisinopril Other (See Comments)   Angioedema   Pantoprazole Itching   Tramadol Hives, Itching   Barium Rash   Developed redness around neck after drinking 1st bottle of Barocat; pt was given Benedryl by ED Mds to be "on the safe side" before drinking 2nd bottle   Chlorhexidine Hives        Medication List        Accurate as of August 08, 2020  9:52 AM. If you have any questions, ask your nurse or doctor.          Banophen 25 mg capsule Generic drug: diphenhydrAMINE TAKE 1 CAPSULE BY MOUTH 1 HOUR BEFORE CT SCAN   cyclobenzaprine 10 MG tablet Commonly known as: FLEXERIL Take 1 tablet (10 mg total) by mouth 3 (three) times daily as needed.   dronabinol 5  MG capsule Commonly known as: MARINOL TAKE 1 CAPSULE (5 MG TOTAL) BY MOUTH 2 (TWO) TIMES DAILY BEFORE A MEAL.   Eliquis 5 MG Tabs tablet Generic drug: apixaban TAKE 1 TABLET (5 MG TOTAL) BY MOUTH 2 (TWO) TIMES DAILY.   EPINEPHrine 0.3 mg/0.3 mL Soaj injection Commonly known as: EpiPen 2-Pak USE AS DIRECTED FOR LIFE THREATENING ALLERGIC REACTIONS   famotidine 40 MG tablet Commonly known as: PEPCID Take 1 tablet (40 mg total) by mouth 2 (two) times daily.   furosemide 80 MG tablet Commonly known as: LASIX TAKE 1 TABLET (80 MG TOTAL) BY MOUTH DAILY.   Fusion Plus Caps Take 1 capsule by mouth daily.   gabapentin 300 MG capsule Commonly known as: NEURONTIN TAKE 1 CAPSULE BY MOUTH THREE TIMES DAILY   HYDROmorphone 4 MG tablet Commonly known as: DILAUDID Take one to two tablets by mouth every four hours as needed for  pain   levothyroxine 200 MCG tablet Commonly known as: SYNTHROID Take one tablet (200 mcg dose) by mouth daily. Total:  288 mcg/day.   levothyroxine 88 MCG tablet Commonly known as: SYNTHROID Take one tablet (88 mcg dose) by mouth daily. Total:  288 mcg/day.   LORazepam 1 MG tablet Commonly known as: ATIVAN Take 1 tablet (1 mg total) by mouth every 6 (six) hours as needed for anxiety.   losartan 25 MG tablet Commonly known as: COZAAR Take 25 mg by mouth as needed.   methylphenidate 10 MG tablet Commonly known as: RITALIN Take 1 tablet (10 mg total) by mouth 2 (two) times daily.   metoCLOPramide 10 MG tablet Commonly known as: REGLAN Take 1 tablet (10 mg total) by mouth every 6 (six) hours as needed for nausea or vomiting.   metolazone 5 MG tablet Commonly known as: ZAROXOLYN TAKE 1 TABLET (5 MG TOTAL) BY MOUTH DAILY. TAKE 1 HOUR BEFORE LASIX.   multivitamin capsule Take 1 capsule by mouth daily.   OLANZapine 10 MG tablet Commonly known as: ZYPREXA TAKE 1 TABLET (10 MG TOTAL) BY MOUTH AT BEDTIME.   ondansetron 8 MG tablet Commonly known as: Zofran Take 1 tablet (8 mg total) by mouth 2 (two) times daily as needed (Nausea or vomiting).   OxyCONTIN 30 MG 12 hr tablet Generic drug: oxyCODONE Take 1 tablet (30 mg total) by mouth every 8 (eight) hours.   polyethylene glycol 17 g packet Commonly known as: MIRALAX / GLYCOLAX Take 17 g by mouth daily as needed for mild constipation.   potassium chloride 10 MEQ tablet Commonly known as: KLOR-CON TAKE 1 TABLET BY MOUTH ONCE DAILY   predniSONE 50 MG tablet Commonly known as: DELTASONE TAKE 1 TABLET BY MOUTH 13 HOURS BEFORE CT SCAN, 1 TABLET 7 HOURS BEFORE CT SCAN, AND 1 TABLET 1 HOUR BEFORE CT SCAN   prochlorperazine 10 MG tablet Commonly known as: COMPAZINE Take 1 tablet (10 mg total) by mouth every 6 (six) hours as needed (Nausea or vomiting).   promethazine 25 MG tablet Commonly known as: PHENERGAN TAKE 1 TABLET  BY MOUTH EVERY 6 HOURS AS NEEDED FOR NAUSEA & VOMITING   senna-docusate 8.6-50 MG tablet Commonly known as: Senokot-S Take 1 tablet by mouth at bedtime as needed for mild constipation.        Allergies:  Allergies  Allergen Reactions   Dextromethorphan-Guaifenesin Other (See Comments)    Irregular heartbeat    Doxycycline Anaphylaxis, Nausea And Vomiting, Rash and Other (See Comments)    "heart arrythmia" and "dyspepsia" (only oral doxycycline  causes reaction)   Gadobutrol Hives and Other (See Comments)    Patient had MRI scan at [facility]. Patient called one hour after he left imaging facility to report two "blisters" that came up on "back" of lip.     Gadolinium Derivatives Hives    Patient had MRI scan at [place]. Patient called one hour after he left imaging facility to report two "blisters" that came up on "back" of lip.    Guaifenesin Palpitations        Ibuprofen Hives and Itching   Lisinopril Other (See Comments)    Angioedema   Pantoprazole Itching   Tramadol Hives and Itching   Barium Rash    Developed redness around neck after drinking 1st bottle of Barocat; pt was given Benedryl by ED Mds to be "on the safe side" before drinking 2nd bottle   Chlorhexidine Hives    Past Medical History, Surgical history, Social history, and Family History were reviewed and updated.  Review of Systems: Review of Systems  Constitutional:  Positive for malaise/fatigue.  HENT: Negative.    Eyes: Negative.   Respiratory: Negative.    Cardiovascular: Negative.   Gastrointestinal:  Positive for abdominal pain, constipation, heartburn and nausea.  Genitourinary: Negative.   Musculoskeletal:  Positive for joint pain.  Skin: Negative.   Neurological: Negative.   Endo/Heme/Allergies: Negative.   Psychiatric/Behavioral: Negative.      Physical Exam:  weight is 275 lb (124.7 kg). His oral temperature is 98.7 F (37.1 C). His blood pressure is 132/82 and his  pulse is 80. His respiration is 18 and oxygen saturation is 100%.   Wt Readings from Last 3 Encounters:  08/08/20 275 lb (124.7 kg)  07/16/20 275 lb (124.7 kg)  07/16/20 275 lb (124.7 kg)    Physical Exam Vitals reviewed.  HENT:     Head: Normocephalic and atraumatic.  Eyes:     Pupils: Pupils are equal, round, and reactive to light.  Cardiovascular:     Rate and Rhythm: Normal rate and regular rhythm.     Heart sounds: Normal heart sounds.  Pulmonary:     Effort: Pulmonary effort is normal.     Breath sounds: Normal breath sounds.  Abdominal:     General: Bowel sounds are normal.     Palpations: Abdomen is soft.  Musculoskeletal:        General: No tenderness or deformity. Normal range of motion.     Cervical back: Normal range of motion.  Lymphadenopathy:     Cervical: No cervical adenopathy.  Skin:    General: Skin is warm and dry.     Findings: No erythema or rash.  Neurological:     Mental Status: He is alert and oriented to person, place, and time.  Psychiatric:        Behavior: Behavior normal.        Thought Content: Thought content normal.        Judgment: Judgment normal.     Lab Results  Component Value Date   WBC 2.9 (L) 08/08/2020   HGB 10.8 (L) 08/08/2020   HCT 33.2 (L) 08/08/2020   MCV 92.5 08/08/2020   PLT 163 08/08/2020   Lab Results  Component Value Date   FERRITIN 1,969 (H) 06/26/2020   IRON 59 06/26/2020   TIBC 223 06/26/2020   UIBC 163 06/26/2020   IRONPCTSAT 27 06/26/2020   Lab Results  Component Value Date   RETICCTPCT 3.9 (H) 08/08/2020   RBC 3.59 (L) 08/08/2020  RBC 3.63 (L) 08/08/2020   No results found for: KPAFRELGTCHN, LAMBDASER, KAPLAMBRATIO No results found for: IGGSERUM, IGA, IGMSERUM No results found for: Odetta Pink, SPEI   Chemistry      Component Value Date/Time   NA 140 08/08/2020 0830   NA 141 02/06/2019 1210   K 4.0 08/08/2020 0830   CL 105 08/08/2020  0830   CO2 29 08/08/2020 0830   BUN 13 08/08/2020 0830   BUN 8 02/06/2019 1210   CREATININE 1.00 08/08/2020 0830      Component Value Date/Time   CALCIUM 9.0 08/08/2020 0830   ALKPHOS 80 08/08/2020 0830   AST 21 08/08/2020 0830   ALT 24 08/08/2020 0830   BILITOT 0.6 08/08/2020 0830       Impression and Plan: 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   He now is on immunotherapy.  Clinically, he is doing nicely.  Hopefully this will translate into a response we do our scans the next time.  I will scan him after his fourth cycle of treatment.  Hopefully, we will see a response to immunotherapy.  It is still a bit too early to know if he is going to respond.  We will start him on Zometa today.  I will plan to see him back in another couple weeks.    [name], MD 6/23/20229:52 AM

## 2020-08-08 NOTE — Telephone Encounter (Signed)
Pts appts prev. made, additional appts made per los and pt to gain sch in tx/avs and through [employer]

## 2020-08-08 NOTE — Patient Instructions (Signed)

## 2020-08-29 ENCOUNTER — Ambulatory Visit: Payer: 59 | Admitting: Hematology & Oncology

## 2020-08-29 ENCOUNTER — Other Ambulatory Visit: Payer: 59

## 2020-08-29 ENCOUNTER — Ambulatory Visit: Payer: 59

## 2020-09-02 ENCOUNTER — Other Ambulatory Visit (HOSPITAL_BASED_OUTPATIENT_CLINIC_OR_DEPARTMENT_OTHER): Payer: Self-pay

## 2020-09-02 ENCOUNTER — Inpatient Hospital Stay: Payer: 59

## 2020-09-02 ENCOUNTER — Inpatient Hospital Stay: Payer: 59 | Attending: Hematology & Oncology

## 2020-09-02 ENCOUNTER — Other Ambulatory Visit: Payer: Self-pay

## 2020-09-02 ENCOUNTER — Inpatient Hospital Stay (HOSPITAL_BASED_OUTPATIENT_CLINIC_OR_DEPARTMENT_OTHER): Payer: 59 | Admitting: Hematology & Oncology

## 2020-09-02 ENCOUNTER — Encounter: Payer: Self-pay | Admitting: Hematology & Oncology

## 2020-09-02 DIAGNOSIS — C787 Secondary malignant neoplasm of liver and intrahepatic bile duct: Secondary | ICD-10-CM | POA: Diagnosis not present

## 2020-09-02 DIAGNOSIS — C73 Malignant neoplasm of thyroid gland: Secondary | ICD-10-CM | POA: Insufficient documentation

## 2020-09-02 DIAGNOSIS — Z79899 Other long term (current) drug therapy: Secondary | ICD-10-CM | POA: Insufficient documentation

## 2020-09-02 DIAGNOSIS — Z5112 Encounter for antineoplastic immunotherapy: Secondary | ICD-10-CM | POA: Insufficient documentation

## 2020-09-02 DIAGNOSIS — C799 Secondary malignant neoplasm of unspecified site: Secondary | ICD-10-CM

## 2020-09-02 LAB — LACTATE DEHYDROGENASE: LDH: 191 U/L (ref 98–192)

## 2020-09-02 LAB — CMP (CANCER CENTER ONLY)
ALT: 19 U/L (ref 0–44)
AST: 22 U/L (ref 15–41)
Albumin: 3.8 g/dL (ref 3.5–5.0)
Alkaline Phosphatase: 77 U/L (ref 38–126)
Anion gap: 7 (ref 5–15)
BUN: 15 mg/dL (ref 6–20)
CO2: 27 mmol/L (ref 22–32)
Calcium: 9.5 mg/dL (ref 8.9–10.3)
Chloride: 104 mmol/L (ref 98–111)
Creatinine: 0.8 mg/dL (ref 0.61–1.24)
GFR, Estimated: >60 mL/min (ref >60)
Glucose, Bld: 141 mg/dL — ABNORMAL HIGH (ref 70–99)
Potassium: 3.8 mmol/L (ref 3.5–5.1)
Sodium: 138 mmol/L (ref 135–145)
Total Bilirubin: 0.5 mg/dL (ref 0.3–1.2)
Total Protein: 6.4 g/dL — ABNORMAL LOW (ref 6.5–8.1)

## 2020-09-02 LAB — CBC WITH DIFFERENTIAL (CANCER CENTER ONLY)
Abs Immature Granulocytes: 0.01 10*3/uL (ref 0.00–0.07)
Basophils Absolute: 0 10*3/uL (ref 0.0–0.1)
Basophils Relative: 1 %
Eosinophils Absolute: 0.1 10*3/uL (ref 0.0–0.5)
Eosinophils Relative: 3 %
HCT: 34.7 % — ABNORMAL LOW (ref 39.0–52.0)
Hemoglobin: 11.7 g/dL — ABNORMAL LOW (ref 13.0–17.0)
Immature Granulocytes: 0 %
Lymphocytes Relative: 16 %
Lymphs Abs: 0.4 10*3/uL — ABNORMAL LOW (ref 0.7–4.0)
MCH: 30.5 pg (ref 26.0–34.0)
MCHC: 33.7 g/dL (ref 30.0–36.0)
MCV: 90.6 fL (ref 80.0–100.0)
Monocytes Absolute: 0.4 10*3/uL (ref 0.1–1.0)
Monocytes Relative: 13 %
Neutro Abs: 1.8 10*3/uL (ref 1.7–7.7)
Neutrophils Relative %: 67 %
Platelet Count: 145 10*3/uL — ABNORMAL LOW (ref 150–400)
RBC: 3.83 MIL/uL — ABNORMAL LOW (ref 4.22–5.81)
RDW: 13.6 % (ref 11.5–15.5)
WBC Count: 2.7 10*3/uL — ABNORMAL LOW (ref 4.0–10.5)
nRBC: 0 % (ref 0.0–0.2)

## 2020-09-02 MED ORDER — SODIUM CHLORIDE 0.9 % IV SOLN
Freq: Once | INTRAVENOUS | Status: AC
  Filled 2020-09-02: qty 250

## 2020-09-02 MED ORDER — SODIUM CHLORIDE 0.9% FLUSH
10.0000 mL | INTRAVENOUS | Status: DC | PRN
  Administered 2020-09-02: 10 mL
  Filled 2020-09-02: qty 10

## 2020-09-02 MED ORDER — SODIUM CHLORIDE 0.9 % IV SOLN
200.0000 mg | Freq: Once | INTRAVENOUS | Status: AC
  Administered 2020-09-02: 200 mg via INTRAVENOUS
  Filled 2020-09-02: qty 8

## 2020-09-02 MED ORDER — HEPARIN SOD (PORK) LOCK FLUSH 100 UNIT/ML IV SOLN
500.0000 [IU] | Freq: Once | INTRAVENOUS | Status: AC | PRN
  Administered 2020-09-02: 500 [IU]
  Filled 2020-09-02: qty 5

## 2020-09-02 MED ORDER — HYDROMORPHONE HCL 4 MG PO TABS
ORAL_TABLET | ORAL | 0 refills | Status: DC
  Filled 2020-09-02: qty 60, 5d supply, fill #0

## 2020-09-02 NOTE — Progress Notes (Signed)
Hematology and Oncology Follow Up Visit  Brian [name] [id number] [birth year] 45 y.o. 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 + Bilateral pulmonary emboli without right heart strain  DVT left popliteal vein    Past Therapy: Cabometyx 40 mg po q day -- start on 04/17/2019 - d/c on 07/17/2019 for progression Lenvima 24 mg po q day -- on hold due to proteinuria Taxotere/CDDP -- s/p cycle 4 - started on 09/13/2019   Current Therapy:     Gavreto 400 mg po q day -- started on 05/17/2020  -- d/c on 06/16/2020 Adriamycin weekly 2 weeks on 1 week off - s/p cycle 8 --d/c on 04/16/2020 due to progression XRT for LEFT hip met Pembrolizumab 200 mg IV q 3 week -- s/p cycle #3 - start on 06/26/2020 Zometa 4 mg IV q 3 months - next dose on 10/2020 Eliquis 5 mg po BID   Interim History:  [name] is here today for follow-up.  He seems to be doing pretty well.  He says he has good days and his bad days.  Pain wise, he seems to be doing fairly well which is nice to see.  He has been a bit more active with his family which I am happy about.  He did have a nice July 4 weekend.  This past weekend, he and his wife went to the church and was able to see the pastor who married them.  He has had no problems with bowels or bladder.  He is on Eliquis.  He is doing well on the Eliquis.  There is no problems with cough or shortness of breath.  He has had no issues with leg swelling.  I must say that he actually does look quite good.  I am just happy that his quality of life is doing better.  Currently, his performance status is ECOG 1.    Medications:  Allergies as of 09/02/2020       Reactions   Dextromethorphan-guaifenesin Other (See Comments)   Irregular heartbeat   Doxycycline Anaphylaxis, Nausea And Vomiting, Rash, Other (See Comments)   "heart arrythmia" and "dyspepsia" (only oral doxycycline causes reaction)   Gadobutrol  Hives, Other (See Comments)   Patient had MRI scan at [facility]. Patient called one hour after he left imaging facility to report two "blisters" that came up on "back" of lip.    Gadolinium Derivatives Hives   Patient had MRI scan at [facility]. Patient called one hour after he left imaging facility to report two "blisters" that came up on "back" of lip.    Guaifenesin Palpitations      Ibuprofen Hives, Itching   Lisinopril Other (See Comments)   Angioedema   Pantoprazole Itching   Tramadol Hives, Itching   Barium Rash   Developed redness around neck after drinking 1st bottle of Barocat; pt was given Benedryl by ED Mds to be "on the safe side" before drinking 2nd bottle   Chlorhexidine Hives        Medication List        Accurate as of September 02, 2020 10:33 AM. If you have any questions, ask your nurse or doctor.          Banophen 25 mg capsule Generic drug: diphenhydrAMINE TAKE 1 CAPSULE BY MOUTH 1 HOUR BEFORE CT SCAN   cyclobenzaprine 10 MG tablet Commonly known as: FLEXERIL Take 1 tablet (10 mg total) by mouth 3 (  three) times daily as needed.   dronabinol 5 MG capsule Commonly known as: MARINOL TAKE 1 CAPSULE (5 MG TOTAL) BY MOUTH 2 (TWO) TIMES DAILY BEFORE A MEAL.   Eliquis 5 MG Tabs tablet Generic drug: apixaban TAKE 1 TABLET (5 MG TOTAL) BY MOUTH 2 (TWO) TIMES DAILY.   EPINEPHrine 0.3 mg/0.3 mL Soaj injection Commonly known as: EpiPen 2-Pak USE AS DIRECTED FOR LIFE THREATENING ALLERGIC REACTIONS   famotidine 40 MG tablet Commonly known as: PEPCID Take 1 tablet (40 mg total) by mouth 2 (two) times daily.   furosemide 80 MG tablet Commonly known as: LASIX TAKE 1 TABLET (80 MG TOTAL) BY MOUTH DAILY.   Fusion Plus Caps Take 1 capsule by mouth daily.   gabapentin 300 MG capsule Commonly known as: NEURONTIN TAKE 1 CAPSULE BY MOUTH THREE TIMES DAILY   HYDROmorphone 4 MG tablet Commonly known as: DILAUDID Take one to two tablets by mouth  every four hours as needed for pain   levothyroxine 200 MCG tablet Commonly known as: SYNTHROID Take one tablet (200 mcg dose) by mouth daily. Total:  288 mcg/day.   levothyroxine 88 MCG tablet Commonly known as: SYNTHROID Take one tablet (88 mcg dose) by mouth daily. Total:  288 mcg/day.   LORazepam 1 MG tablet Commonly known as: ATIVAN Take 1 tablet (1 mg total) by mouth every 6 (six) hours as needed for anxiety.   losartan 25 MG tablet Commonly known as: COZAAR Take 25 mg by mouth as needed.   methylphenidate 10 MG tablet Commonly known as: RITALIN Take 1 tablet (10 mg total) by mouth 2 (two) times daily.   metoCLOPramide 10 MG tablet Commonly known as: REGLAN Take 1 tablet (10 mg total) by mouth every 6 (six) hours as needed for nausea or vomiting.   metolazone 5 MG tablet Commonly known as: ZAROXOLYN TAKE 1 TABLET (5 MG TOTAL) BY MOUTH DAILY. TAKE 1 HOUR BEFORE LASIX.   multivitamin capsule Take 1 capsule by mouth daily.   OLANZapine 10 MG tablet Commonly known as: ZYPREXA TAKE 1 TABLET (10 MG TOTAL) BY MOUTH AT BEDTIME.   ondansetron 8 MG tablet Commonly known as: Zofran Take 1 tablet (8 mg total) by mouth 2 (two) times daily as needed (Nausea or vomiting).   OxyCONTIN 30 MG 12 hr tablet Generic drug: oxyCODONE Take 1 tablet (30 mg total) by mouth every 8 (eight) hours.   polyethylene glycol 17 g packet Commonly known as: MIRALAX / GLYCOLAX Take 17 g by mouth daily as needed for mild constipation.   potassium chloride 10 MEQ tablet Commonly known as: KLOR-CON TAKE 1 TABLET BY MOUTH ONCE DAILY   predniSONE 50 MG tablet Commonly known as: DELTASONE TAKE 1 TABLET BY MOUTH 13 HOURS BEFORE CT SCAN, 1 TABLET 7 HOURS BEFORE CT SCAN, AND 1 TABLET 1 HOUR BEFORE CT SCAN   prochlorperazine 10 MG tablet Commonly known as: COMPAZINE Take 1 tablet (10 mg total) by mouth every 6 (six) hours as needed (Nausea or vomiting).   promethazine 25 MG tablet Commonly known  as: PHENERGAN TAKE 1 TABLET BY MOUTH EVERY 6 HOURS AS NEEDED FOR NAUSEA & VOMITING   senna-docusate 8.6-50 MG tablet Commonly known as: Senokot-S Take 1 tablet by mouth at bedtime as needed for mild constipation.        Allergies:  Allergies  Allergen Reactions   Dextromethorphan-Guaifenesin Other (See Comments)    Irregular heartbeat    Doxycycline Anaphylaxis, Nausea And Vomiting, Rash and Other (See Comments)    "  heart arrythmia" and "dyspepsia" (only oral doxycycline causes reaction)   Gadobutrol Hives and Other (See Comments)    Patient had MRI scan at [facility]. Patient called one hour after he left imaging facility to report two "blisters" that came up on "back" of lip.     Gadolinium Derivatives Hives    Patient had MRI scan at [place]. Patient called one hour after he left imaging facility to report two "blisters" that came up on "back" of lip.    Guaifenesin Palpitations        Ibuprofen Hives and Itching   Lisinopril Other (See Comments)    Angioedema   Pantoprazole Itching   Tramadol Hives and Itching   Barium Rash    Developed redness around neck after drinking 1st bottle of Barocat; pt was given Benedryl by ED Mds to be "on the safe side" before drinking 2nd bottle   Chlorhexidine Hives    Past Medical History, Surgical history, Social history, and Family History were reviewed and updated.  Review of Systems: Review of Systems  Constitutional:  Positive for malaise/fatigue.  HENT: Negative.    Eyes: Negative.   Respiratory: Negative.    Cardiovascular: Negative.   Gastrointestinal:  Positive for abdominal pain, constipation, heartburn and nausea.  Genitourinary: Negative.   Musculoskeletal:  Positive for joint pain.  Skin: Negative.   Neurological: Negative.   Endo/Heme/Allergies: Negative.   Psychiatric/Behavioral: Negative.      Physical Exam:  weight is 272 lb (123.4 kg).   Wt Readings from Last 3 Encounters:  09/02/20 272  lb (123.4 kg)  08/08/20 275 lb (124.7 kg)  07/16/20 275 lb (124.7 kg)    Physical Exam Vitals reviewed.  HENT:     Head: Normocephalic and atraumatic.  Eyes:     Pupils: Pupils are equal, round, and reactive to light.  Cardiovascular:     Rate and Rhythm: Normal rate and regular rhythm.     Heart sounds: Normal heart sounds.  Pulmonary:     Effort: Pulmonary effort is normal.     Breath sounds: Normal breath sounds.  Abdominal:     General: Bowel sounds are normal.     Palpations: Abdomen is soft.  Musculoskeletal:        General: No tenderness or deformity. Normal range of motion.     Cervical back: Normal range of motion.  Lymphadenopathy:     Cervical: No cervical adenopathy.  Skin:    General: Skin is warm and dry.     Findings: No erythema or rash.  Neurological:     Mental Status: He is alert and oriented to person, place, and time.  Psychiatric:        Behavior: Behavior normal.        Thought Content: Thought content normal.        Judgment: Judgment normal.     Lab Results  Component Value Date   WBC 2.7 (L) 09/02/2020   HGB 11.7 (L) 09/02/2020   HCT 34.7 (L) 09/02/2020   MCV 90.6 09/02/2020   PLT 145 (L) 09/02/2020   Lab Results  Component Value Date   FERRITIN 775 (H) 08/08/2020   IRON 67 08/08/2020   TIBC 214 08/08/2020   UIBC 147 08/08/2020   IRONPCTSAT 31 08/08/2020   Lab Results  Component Value Date   RETICCTPCT 3.9 (H) 08/08/2020   RBC 3.83 (L) 09/02/2020   No results found for: KPAFRELGTCHN, LAMBDASER, KAPLAMBRATIO No results found for: IGGSERUM, IGA, IGMSERUM No results found  for: Odetta Pink, SPEI   Chemistry      Component Value Date/Time   NA 138 09/02/2020 0828   NA 141 02/06/2019 1210   K 3.8 09/02/2020 0828   CL 104 09/02/2020 0828   CO2 27 09/02/2020 0828   BUN 15 09/02/2020 0828   BUN 8 02/06/2019 1210   CREATININE 0.80 09/02/2020 0828      Component Value  Date/Time   CALCIUM 9.5 09/02/2020 0828   ALKPHOS 77 09/02/2020 0828   AST 22 09/02/2020 0828   ALT 19 09/02/2020 0828   BILITOT 0.5 09/02/2020 0828       Impression and Plan: 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   He now is on immunotherapy.  Clinically, he is doing nicely.  Hopefully this will translate into a response we do our scans the next time.  I will set him up with scans after this cycle of treatment.  Hopefully will see that he is responding.  I would have to think that he is responding since he is does not have as much pain.  We will plan for another follow-up in 3 weeks or so.    [name], MD 7/18/202210:33 AM

## 2020-09-02 NOTE — Patient Instructions (Signed)
[facility]  Discharge Instructions: Thank you for choosing [facility] to provide your oncology and hematology care.   If you have a lab appointment with the [facility], please go directly to the [facility] and check in at the registration area.  Wear comfortable clothing and clothing appropriate for easy access to any Portacath or PICC line.   We strive to give you quality time with your provider. You may need to reschedule your appointment if you arrive late (15 or more minutes).  Arriving late affects you and other patients whose appointments are after yours.  Also, if you miss three or more appointments without notifying the office, you may be dismissed from the clinic at the provider's discretion.      For prescription refill requests, have your pharmacy contact our office and allow 72 hours for refills to be completed.    Today you received the following chemotherapy and/or immunotherapy agents Keytruda      To help prevent nausea and vomiting after your treatment, we encourage you to take your nausea medication as directed.  BELOW ARE SYMPTOMS THAT SHOULD BE REPORTED IMMEDIATELY: *FEVER GREATER THAN 100.4 F (38 C) OR HIGHER *CHILLS OR SWEATING *NAUSEA AND VOMITING THAT IS NOT CONTROLLED WITH YOUR NAUSEA MEDICATION *UNUSUAL SHORTNESS OF BREATH *UNUSUAL BRUISING OR BLEEDING *URINARY PROBLEMS (pain or burning when urinating, or frequent urination) *BOWEL PROBLEMS (unusual diarrhea, constipation, pain near the anus) TENDERNESS IN MOUTH AND THROAT WITH OR WITHOUT PRESENCE OF ULCERS (sore throat, sores in mouth, or a toothache) UNUSUAL RASH, SWELLING OR PAIN  UNUSUAL VAGINAL DISCHARGE OR ITCHING   Items with * indicate a potential emergency and should be followed up as soon as possible or go to the Emergency Department if any problems should occur.  Please show the CHEMOTHERAPY ALERT CARD or IMMUNOTHERAPY ALERT CARD at check-in to the  Emergency Department and triage nurse. Should you have questions after your visit or need to cancel or reschedule your appointment, please contact [facility]  [phone] and follow the prompts.  Office hours are 8:00 a.m. to 4:30 p.m. Monday - Friday. Please note that voicemails left after 4:00 p.m. may not be returned until the following business day.  We are closed weekends and major holidays. You have access to a nurse at all times for urgent questions. Please call the main number to the clinic [phone] and follow the prompts.  For any non-urgent questions, you may also contact your provider using MyChart. We now offer e-Visits for anyone 41 and older to request care online for non-urgent symptoms. For details visit [email or web address].   Also download the MyChart app! Go to the app store, search "MyChart", open the app, select [facility], and log in with your MyChart username and password.  Due to Covid, a mask is required upon entering the hospital/clinic. If you do not have a mask, one will be given to you upon arrival. For doctor visits, patients may have 1 support person aged 48 or older with them. For treatment visits, patients cannot have anyone with them due to current Covid guidelines and our immunocompromised population.

## 2020-09-02 NOTE — Patient Instructions (Signed)
Implanted Port Home Guide An implanted port is a device that is placed under the skin. It is usually placed in the chest. The device can be used to give IV medicine, to take blood, or for dialysis. You may have an implanted port if: You need IV medicine that would be irritating to the small veins in your hands or arms. You need IV medicines, such as antibiotics, for a long period of time. You need IV nutrition for a long period of time. You need dialysis. When you have a port, your health care provider can choose to use the port instead of veins in your arms for these procedures. You may have fewer limitations when using a port than you would if you used other types of long-term IVs, and you will likely be able to return to normal activities afteryour incision heals. An implanted port has two main parts: Reservoir. The reservoir is the part where a needle is inserted to give medicines or draw blood. The reservoir is round. After it is placed, it appears as a small, raised area under your skin. Catheter. The catheter is a thin, flexible tube that connects the reservoir to a vein. Medicine that is inserted into the reservoir goes into the catheter and then into the vein. How is my port accessed? To access your port: A numbing cream may be placed on the skin over the port site. Your health care provider will put on a mask and sterile gloves. The skin over your port will be cleaned carefully with a germ-killing soap and allowed to dry. Your health care provider will gently pinch the port and insert a needle into it. Your health care provider will check for a blood return to make sure the port is in the vein and is not clogged. If your port needs to remain accessed to get medicine continuously (constant infusion), your health care provider will place a clear bandage (dressing) over the needle site. The dressing and needle will need to be changed every week, or as told by your health care provider. What  is flushing? Flushing helps keep the port from getting clogged. Follow instructions from your health care provider about how and when to flush the port. Ports are usually flushed with saline solution or a medicine called heparin. The need for flushing will depend on how the port is used: If the port is only used from time to time to give medicines or draw blood, the port may need to be flushed: Before and after medicines have been given. Before and after blood has been drawn. As part of routine maintenance. Flushing may be recommended every 4-6 weeks. If a constant infusion is running, the port may not need to be flushed. Throw away any syringes in a disposal container that is meant for sharp items (sharps container). You can buy a sharps container from a pharmacy, or you can make one by using an empty hard plastic bottle with a cover. How long will my port stay implanted? The port can stay in for as long as your health care provider thinks it is needed. When it is time for the port to come out, a surgery will be done to remove it. The surgery will be similar to the procedure that was done to putthe port in. Follow these instructions at home:  Flush your port as told by your health care provider. If you need an infusion over several days, follow instructions from your health care provider about how to take   care of your port site. Make sure you: Wash your hands with soap and water before you change your dressing. If soap and water are not available, use alcohol-based hand sanitizer. Change your dressing as told by your health care provider. Place any used dressings or infusion bags into a plastic bag. Throw that bag in the trash. Keep the dressing that covers the needle clean and dry. Do not get it wet. Do not use scissors or sharp objects near the tube. Keep the tube clamped, unless it is being used. Check your port site every day for signs of infection. Check for: Redness, swelling, or  pain. Fluid or blood. Pus or a bad smell. Protect the skin around the port site. Avoid wearing bra straps that rub or irritate the site. Protect the skin around your port from seat belts. Place a soft pad over your chest if needed. Bathe or shower as told by your health care provider. The site may get wet as long as you are not actively receiving an infusion. Return to your normal activities as told by your health care provider. Ask your health care provider what activities are safe for you. Carry a medical alert card or wear a medical alert bracelet at all times. This will let health care providers know that you have an implanted port in case of an emergency. Get help right away if: You have redness, swelling, or pain at the port site. You have fluid or blood coming from your port site. You have pus or a bad smell coming from the port site. You have a fever. Summary Implanted ports are usually placed in the chest for long-term IV access. Follow instructions from your health care provider about flushing the port and changing bandages (dressings). Take care of the area around your port by avoiding clothing that puts pressure on the area, and by watching for signs of infection. Protect the skin around your port from seat belts. Place a soft pad over your chest if needed. Get help right away if you have a fever or you have redness, swelling, pain, drainage, or a bad smell at the port site. This information is not intended to replace advice given to you by your health care provider. Make sure you discuss any questions you have with your healthcare provider. Document Revised: 06/19/2019 Document Reviewed: 06/19/2019 Elsevier Patient Education  2022 [employer] Inc.  

## 2020-09-04 ENCOUNTER — Other Ambulatory Visit: Payer: Self-pay | Admitting: *Deleted

## 2020-09-04 ENCOUNTER — Other Ambulatory Visit: Payer: Self-pay | Admitting: Hematology & Oncology

## 2020-09-04 ENCOUNTER — Other Ambulatory Visit (HOSPITAL_BASED_OUTPATIENT_CLINIC_OR_DEPARTMENT_OTHER): Payer: Self-pay

## 2020-09-04 DIAGNOSIS — C73 Malignant neoplasm of thyroid gland: Secondary | ICD-10-CM

## 2020-09-04 MED ORDER — DIPHENHYDRAMINE HCL 25 MG PO CAPS
ORAL_CAPSULE | ORAL | 0 refills | Status: DC
  Filled 2020-09-04: qty 30, fill #0

## 2020-09-04 MED ORDER — PREDNISONE 50 MG PO TABS
ORAL_TABLET | ORAL | 0 refills | Status: DC
Start: 2020-09-04 — End: 2021-01-13
  Filled 2020-09-04: qty 3, 1d supply, fill #0

## 2020-09-04 MED ORDER — EPINEPHRINE 0.3 MG/0.3ML IJ SOAJ
INTRAMUSCULAR | 3 refills | Status: DC
  Filled 2020-09-04: qty 4, 60d supply, fill #0
  Filled 2021-08-18: qty 4, 60d supply, fill #1

## 2020-09-05 ENCOUNTER — Other Ambulatory Visit (HOSPITAL_BASED_OUTPATIENT_CLINIC_OR_DEPARTMENT_OTHER): Payer: Self-pay

## 2020-09-05 MED FILL — Apixaban Tab 5 MG: ORAL | 30 days supply | Qty: 60 | Fill #2 | Status: AC

## 2020-09-09 ENCOUNTER — Encounter: Payer: Self-pay | Admitting: Urology

## 2020-09-09 ENCOUNTER — Other Ambulatory Visit: Payer: Self-pay

## 2020-09-09 NOTE — Progress Notes (Signed)
Patient states his hip pain is better. He complains of generalized pain that is constant 4-5/10. No other issues or complaints of is aware his appointment is by phone with Ashlyn on Wednesday.

## 2020-09-10 NOTE — Progress Notes (Signed)
  Radiation Oncology         [phone] ________________________________  Name: Brian [name] MRN: [id number]  Date: 07/29/2020  DOB: 11-19-1975  End of Treatment Note  Diagnosis:   45 y.o. 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     Indication for treatment: Palliation       Radiation treatment dates:   07/16/20 - 07/29/20  Site/dose:   The target in the left hip/ilium was treated to 30 Gy in 10 fractions.  Beams/energy:   A 3D field set-up was employed with 6 MV X-rays  Narrative: The patient tolerated radiation treatment relatively well with only mild nausea and mild constipation but significant improvement in his pain from 8-9/10 in severity to 1-2/10 in severity.  Plan: The patient has completed radiation treatment. The patient will return to radiation oncology clinic for routine followup in one month. I advised him to call or return sooner if he has any questions or concerns related to his recovery or treatment. ________________________________  Sheral Apley. Tammi Klippel, M.D.

## 2020-09-11 ENCOUNTER — Ambulatory Visit
Admission: RE | Admit: 2020-09-11 | Discharge: 2020-09-11 | Disposition: A | Discharge status: Home / Self Care | Payer: 59 | Source: Ambulatory Visit | Attending: Urology | Admitting: Urology

## 2020-09-11 ENCOUNTER — Other Ambulatory Visit: Payer: Self-pay

## 2020-09-11 DIAGNOSIS — C73 Malignant neoplasm of thyroid gland: Secondary | ICD-10-CM

## 2020-09-11 NOTE — Progress Notes (Signed)
Radiation Oncology         [phone] ________________________________  Post-treatment Note  Name: Brian [name] MRN: [id number]  Date of Service: 09/11/2020 DOB: [birth year]  KK:XFGHWE, [name], MD  [name], MD   REFERRING PHYSICIAN: [name], MD  DIAGNOSIS: 45 y.o. 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    ICD-10-CM   1. Primary cancer of thyroid with metastasis to other site [facility])  C73      Interval Since Last Radiation:  6.5 weeks 07/16/20 - 07/29/20: The target in the left hip/ilium was treated to 30 Gy in 10 fractions.  04/15/20 - 04/26/20:  The liver nodule and portacaval lymph node were treated to a prescription dose of 30 Gy in 10 fractions of 3 Gy each.   Narrative:  He tolerated radiation treatment relatively well with only mild nausea and mild constipation but significant improvement in his pain from 8-9/10 in severity to 1-2/10 in severity.  I attempted to call him this morning to conduct his routine scheduled 1 month follow up visit by telephone but had to leave a VM message for him.  HISTORY OF PRESENT ILLNESS:  In summary:  He has a history of aggressive, Hurthle cell carcinoma of the thyroid and has been on multiple courses of systemic therapy under the care and direction of Dr. Marin Olp.  He presented to the emergency department on 04/11/2020 with worsening, severe abdominal pain.  He has some chronic right-sided abdominal pain associated with his metastatic disease but had noted worsening pain over the past week, becoming severe despite a recent increase in his narcotic pain medications with long-acting oxycodone and using OxyIR for breakthrough pain.  A CT A/P was performed on admission and showed interval enlargement of the metastatic liver lesion (7.0 x 6.2 x 6.3 cm) and a portacaval lymph node (6.8 x 5.2 cm) as compared to his prior imaging on 02/08/2020.  The  patient was admitted for pain control and we were consulted for consideration of palliative radiotherapy which was completed on 04/26/20.  Unfortunately, follow-up restaging MRI abdomen showed disease progression in the liver lesions despite the recent course of radiotherapy and systemic treatment with Gavreto 400 mg daily.  He was seen at the [facility] at the end of April 2022 and the recommendation was to d/c the [facility] and proceed with pembrolizumab (Keytruda) 200 mg every 3 weeks which was started under the care of Dr. Marin Olp on 06/26/2020.  As part of his work-up at [facility], he had a repeat PET scan on 06/14/2020 which showed the treated portacaval node had decreased in size but there were persistent liver mets as well as a new 3 cm lytic lesion involving the left ilium with soft tissue extension.  He had been having significant pain in the left hip area that he was attributing to nerve irritation from a previous procedure.  When we met with him on 07/05/20, he reported persistent pain at a 4 out of 10 while sitting but an increase up to 9 out of 10 with any ambulation or activity and would occasionally wake him from sleep at night.  He was no longer getting adequate pain relief with his narcotic pain medications.  He denied any numbness or tingling in the lower extremities or focal weakness and had not had any changes in his bowel or bladder control and otherwise feels well in general.  We discussed  the potential for palliative radiotherapy to the new, painful metastatic lesion in the left ilium and he was in agreement to proceed. His radiotherapy to the left hip was completed 07/29/20 and tolerated very well.  PAST MEDICAL HISTORY:  Past Medical History:  Diagnosis Date   Chronic diastolic CHF (congestive heart failure) ([place]) 04/11/2020   DVT, lower extremity, distal, acute, left ([place]) 01/14/2020   Essential hypertension 09/09/2018   Goals of care, counseling/discussion 01/14/2020   Kidney stone     Primary cancer of thyroid with metastasis to other site ([place]) 05/04/2018   Rickettsia infection       PAST SURGICAL HISTORY: Past Surgical History:  Procedure Laterality Date   BIOPSY  04/05/2018   Procedure: BIOPSY;  Surgeon: Ronald Lobo, MD;  Location: [facility] ENDOSCOPY;  Service: Endoscopy;;   CHOLECYSTECTOMY     ESOPHAGOGASTRODUODENOSCOPY (EGD) WITH PROPOFOL N/A 04/05/2018   Procedure: ESOPHAGOGASTRODUODENOSCOPY (EGD) WITH PROPOFOL;  Surgeon: Ronald Lobo, MD;  Location: [facility] ENDOSCOPY;  Service: Endoscopy;  Laterality: N/A;   IR IMAGING GUIDED PORT INSERTION  09/13/2019   IR RADIOLOGIST EVAL & MGMT  10/11/2018    FAMILY HISTORY:  Family History  Problem Relation Age of Onset   Diabetes Mother    Heart disease Mother    Heart failure Mother    Heart attack Mother    Hyperlipidemia Mother    Hypertension Mother    Hyperlipidemia Father    Allergic rhinitis Father    Rashes / Skin problems Father    Sudden death Neg Hx    Thyroid disease Neg Hx     SOCIAL HISTORY:  Social History   Socioeconomic History   Marital status: Married    Spouse name: Not on file   Number of children: Not on file   Years of education: Not on file   Highest education level: Not on file  Occupational History   Not on file  Tobacco Use   Smoking status: Never   Smokeless tobacco: Never  Vaping Use   Vaping Use: Never used  Substance and Sexual Activity   Alcohol use: No   Drug use: No   Sexual activity: Not on file  Other Topics Concern   Not on file  Social History Narrative   Not on file   Social Determinants of Health   Financial Resource Strain: Not on file  Food Insecurity: Not on file  Transportation Needs: Not on file  Physical Activity: Not on file  Stress: Not on file  Social Connections: Not on file  Intimate Partner Violence: Not on file    ALLERGIES: Dextromethorphan-guaifenesin, Doxycycline, Gadobutrol, Gadolinium derivatives, Guaifenesin, Ibuprofen, Lisinopril,  Pantoprazole, Tramadol, Barium, and Chlorhexidine  MEDICATIONS:  Current Outpatient Medications  Medication Sig Dispense Refill   apixaban (ELIQUIS) 5 MG TABS tablet TAKE 1 TABLET (5 MG TOTAL) BY MOUTH 2 (TWO) TIMES DAILY. 60 tablet 6   cyclobenzaprine (FLEXERIL) 10 MG tablet Take 1 tablet (10 mg total) by mouth 3 (three) times daily as needed. 90 tablet 1   diphenhydrAMINE (BENADRYL) 25 mg capsule TAKE 1 CAPSULE BY MOUTH 1 HOUR BEFORE CT SCAN 30 capsule 0   dronabinol (MARINOL) 5 MG capsule TAKE 1 CAPSULE (5 MG TOTAL) BY MOUTH 2 (TWO) TIMES DAILY BEFORE A MEAL. 60 capsule 0   EPINEPHrine (EPIPEN 2-PAK) 0.3 mg/0.3 mL IJ SOAJ injection USE AS DIRECTED FOR LIFE THREATENING ALLERGIC REACTIONS 4 each 3   famotidine (PEPCID) 40 MG tablet Take 1 tablet (40 mg total) by mouth 2 (two)  times daily. 60 tablet 4   furosemide (LASIX) 80 MG tablet TAKE 1 TABLET (80 MG TOTAL) BY MOUTH DAILY. 30 tablet 1   gabapentin (NEURONTIN) 300 MG capsule TAKE 1 CAPSULE BY MOUTH THREE TIMES DAILY 90 capsule 2   HYDROmorphone (DILAUDID) 4 MG tablet Take one to two tablets by mouth every four hours as needed for pain 60 tablet 0   Iron-FA-B Cmp-C-Biot-Probiotic (FUSION PLUS) CAPS Take 1 capsule by mouth daily. 30 capsule 6   levothyroxine (SYNTHROID) 200 MCG tablet Take one tablet (200 mcg dose) by mouth daily. Total:  288 mcg/day. 90 tablet 3   levothyroxine (SYNTHROID) 88 MCG tablet Take one tablet (88 mcg dose) by mouth daily. Total:  288 mcg/day. 90 tablet 3   LORazepam (ATIVAN) 1 MG tablet Take 1 tablet (1 mg total) by mouth every 6 (six) hours as needed for anxiety. 30 tablet 0   losartan (COZAAR) 25 MG tablet Take 25 mg by mouth as needed.     methylphenidate (RITALIN) 10 MG tablet Take 1 tablet (10 mg total) by mouth 2 (two) times daily. 60 tablet 0   metoCLOPramide (REGLAN) 10 MG tablet Take 1 tablet (10 mg total) by mouth every 6 (six) hours as needed for nausea or vomiting. 30 tablet 0   metolazone (ZAROXOLYN) 5  MG tablet TAKE 1 TABLET (5 MG TOTAL) BY MOUTH DAILY. TAKE 1 HOUR BEFORE LASIX. 30 tablet 1   Multiple Vitamin (MULTIVITAMIN) capsule Take 1 capsule by mouth daily.     OLANZapine (ZYPREXA) 10 MG tablet TAKE 1 TABLET (10 MG TOTAL) BY MOUTH AT BEDTIME. 30 tablet 4   ondansetron (ZOFRAN) 8 MG tablet Take 1 tablet (8 mg total) by mouth 2 (two) times daily as needed (Nausea or vomiting). 30 tablet 1   oxyCODONE 30 MG 12 hr tablet Take 1 tablet (30 mg total) by mouth every 8 (eight) hours. 90 tablet 0   polyethylene glycol (MIRALAX / GLYCOLAX) 17 g packet Take 17 g by mouth daily as needed for mild constipation. 14 each 0   potassium chloride (KLOR-CON) 10 MEQ tablet TAKE 1 TABLET BY MOUTH ONCE DAILY 30 tablet 1   predniSONE (DELTASONE) 50 MG tablet TAKE 1 TABLET BY MOUTH 13 HOURS BEFORE CT SCAN, 1 TABLET 7 HOURS BEFORE CT SCAN, AND 1 TABLET 1 HOUR BEFORE CT SCAN 3 tablet 0   prochlorperazine (COMPAZINE) 10 MG tablet Take 1 tablet (10 mg total) by mouth every 6 (six) hours as needed (Nausea or vomiting). 30 tablet 1   promethazine (PHENERGAN) 25 MG tablet TAKE 1 TABLET BY MOUTH EVERY 6 HOURS AS NEEDED FOR NAUSEA & VOMITING 120 tablet 0   senna-docusate (SENOKOT-S) 8.6-50 MG tablet Take 1 tablet by mouth at bedtime as needed for mild constipation. 30 tablet 0   No current facility-administered medications for this encounter.   Facility-Administered Medications Ordered in Other Encounters  Medication Dose Route Frequency Provider Last Rate Last Admin   HYDROmorphone (DILAUDID) injection 4 mg  4 mg Intravenous Once Ennever, Rudell Cobb, MD        REVIEW OF SYSTEMS:  On review of systems, the patient reports that he is doing well overall. His hip pain improved significantly with radiation. He does have some generalized pain that is constant 4-5/10 but well controlled with pain medications prn.   PHYSICAL EXAM:  Wt Readings from Last 3 Encounters:  09/02/20 272 lb (123.4 kg)  08/08/20 275 lb (124.7 kg)   07/16/20 275 lb (124.7 kg)  Temp Readings from Last 3 Encounters:  09/02/20 97.8 F (36.6 C) (Oral)  08/08/20 98.7 F (37.1 C) (Oral)  07/16/20 98.3 F (36.8 C) (Oral)   BP Readings from Last 3 Encounters:  09/02/20 119/66  08/08/20 132/82  07/16/20 122/81   Pulse Readings from Last 3 Encounters:  09/02/20 77  08/08/20 80  07/16/20 99   Pain Assessment Pain Score: 4  Pain Frequency: Constant Pain Loc: Generalized/10  Unable to assess due to telephone follow up visit format.   KPS = 90  100 - Normal; no complaints; no evidence of disease. 90   - Able to carry on normal activity; minor signs or symptoms of disease. 80   - Normal activity with effort; some signs or symptoms of disease. 66   - Cares for self; unable to carry on normal activity or to do active work. 60   - Requires occasional assistance, but is able to care for most of his personal needs. 50   - Requires considerable assistance and frequent medical care. 22   - Disabled; requires special care and assistance. 36   - Severely disabled; hospital admission is indicated although death not imminent. 50   - Very sick; hospital admission necessary; active supportive treatment necessary. 10   - Moribund; fatal processes progressing rapidly. 0     - Dead  Karnofsky DA, Abelmann [place], Craver LS and Burchenal [facility] [phone]) The use of the nitrogen mustards in the palliative treatment of carcinoma: with particular reference to bronchogenic carcinoma Cancer 1 634-56  LABORATORY DATA:  Lab Results  Component Value Date   WBC 2.7 (L) 09/02/2020   HGB 11.7 (L) 09/02/2020   HCT 34.7 (L) 09/02/2020   MCV 90.6 09/02/2020   PLT 145 (L) 09/02/2020   Lab Results  Component Value Date   NA 138 09/02/2020   K 3.8 09/02/2020   CL 104 09/02/2020   CO2 27 09/02/2020   Lab Results  Component Value Date   ALT 19 09/02/2020   AST 22 09/02/2020   ALKPHOS 77 09/02/2020   BILITOT 0.5 09/02/2020     RADIOGRAPHY: No results  found.    IMPRESSION/PLAN: 1. 45 y.o. 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  It appears that he has recovered well from the effects of his recent radiation and has had significant improvement in his pain based on report from nursing.  I left him a detailed message on his voicemail advising that he is welcome to call with any questions or concerns but that if he is doing well, we will just plan to see him back on an as-needed basis.  Of course, we are more than happy to continue to participate in his care if clinically indicated in the future.  He will continue in routine follow-up under the care and direction of Dr. Marin Olp for continued management of his systemic disease.     Nicholos Johns, PA-C    Tyler Pita, MD  [facility] Direct Dial: [phone]  Fax: [phone] [email or web address]  Skype  LinkedIn

## 2020-09-16 ENCOUNTER — Other Ambulatory Visit: Payer: Self-pay | Admitting: Family

## 2020-09-17 ENCOUNTER — Ambulatory Visit (HOSPITAL_COMMUNITY): Payer: 59

## 2020-09-23 ENCOUNTER — Other Ambulatory Visit (HOSPITAL_BASED_OUTPATIENT_CLINIC_OR_DEPARTMENT_OTHER): Payer: Self-pay

## 2020-09-23 ENCOUNTER — Inpatient Hospital Stay: Payer: 59

## 2020-09-23 ENCOUNTER — Encounter (HOSPITAL_BASED_OUTPATIENT_CLINIC_OR_DEPARTMENT_OTHER): Payer: Self-pay

## 2020-09-23 ENCOUNTER — Encounter: Payer: Self-pay | Admitting: Hematology & Oncology

## 2020-09-23 ENCOUNTER — Ambulatory Visit (HOSPITAL_BASED_OUTPATIENT_CLINIC_OR_DEPARTMENT_OTHER)
Admission: RE | Admit: 2020-09-23 | Discharge: 2020-09-23 | Disposition: A | Discharge status: Home / Self Care | Payer: 59 | Source: Ambulatory Visit | Attending: Hematology & Oncology | Admitting: Hematology & Oncology

## 2020-09-23 ENCOUNTER — Other Ambulatory Visit: Payer: Self-pay

## 2020-09-23 ENCOUNTER — Ambulatory Visit: Payer: 59 | Admitting: Hematology & Oncology

## 2020-09-23 ENCOUNTER — Inpatient Hospital Stay (HOSPITAL_BASED_OUTPATIENT_CLINIC_OR_DEPARTMENT_OTHER): Payer: 59 | Admitting: Hematology & Oncology

## 2020-09-23 ENCOUNTER — Ambulatory Visit: Payer: 59

## 2020-09-23 ENCOUNTER — Other Ambulatory Visit: Payer: Self-pay | Admitting: Hematology & Oncology

## 2020-09-23 ENCOUNTER — Inpatient Hospital Stay: Payer: 59 | Attending: Hematology & Oncology

## 2020-09-23 VITALS — BP 126/90 | HR 78 | Temp 98.2°F | Resp 17 | Wt 270.0 lb

## 2020-09-23 DIAGNOSIS — Z79899 Other long term (current) drug therapy: Secondary | ICD-10-CM | POA: Diagnosis not present

## 2020-09-23 DIAGNOSIS — Z5112 Encounter for antineoplastic immunotherapy: Secondary | ICD-10-CM | POA: Insufficient documentation

## 2020-09-23 DIAGNOSIS — C799 Secondary malignant neoplasm of unspecified site: Secondary | ICD-10-CM | POA: Insufficient documentation

## 2020-09-23 DIAGNOSIS — C73 Malignant neoplasm of thyroid gland: Secondary | ICD-10-CM | POA: Insufficient documentation

## 2020-09-23 DIAGNOSIS — C787 Secondary malignant neoplasm of liver and intrahepatic bile duct: Secondary | ICD-10-CM | POA: Diagnosis not present

## 2020-09-23 DIAGNOSIS — I700 Atherosclerosis of aorta: Secondary | ICD-10-CM | POA: Diagnosis not present

## 2020-09-23 DIAGNOSIS — R911 Solitary pulmonary nodule: Secondary | ICD-10-CM | POA: Diagnosis not present

## 2020-09-23 LAB — CMP (CANCER CENTER ONLY)
ALT: 24 U/L (ref 0–44)
AST: 20 U/L (ref 15–41)
Albumin: 3.9 g/dL (ref 3.5–5.0)
Alkaline Phosphatase: 75 U/L (ref 38–126)
Anion gap: 7 (ref 5–15)
BUN: 16 mg/dL (ref 6–20)
CO2: 27 mmol/L (ref 22–32)
Calcium: 9.4 mg/dL (ref 8.9–10.3)
Chloride: 103 mmol/L (ref 98–111)
Creatinine: 0.97 mg/dL (ref 0.61–1.24)
GFR, Estimated: >60 mL/min (ref >60)
Glucose, Bld: 106 mg/dL — ABNORMAL HIGH (ref 70–99)
Potassium: 4 mmol/L (ref 3.5–5.1)
Sodium: 137 mmol/L (ref 135–145)
Total Bilirubin: 0.4 mg/dL (ref 0.3–1.2)
Total Protein: 6.3 g/dL — ABNORMAL LOW (ref 6.5–8.1)

## 2020-09-23 LAB — CBC WITH DIFFERENTIAL (CANCER CENTER ONLY)
Abs Immature Granulocytes: 0.01 10*3/uL (ref 0.00–0.07)
Basophils Absolute: 0 10*3/uL (ref 0.0–0.1)
Basophils Relative: 1 %
Eosinophils Absolute: 0.1 10*3/uL (ref 0.0–0.5)
Eosinophils Relative: 2 %
HCT: 39.1 % (ref 39.0–52.0)
Hemoglobin: 13.4 g/dL (ref 13.0–17.0)
Immature Granulocytes: 0 %
Lymphocytes Relative: 19 %
Lymphs Abs: 0.8 10*3/uL (ref 0.7–4.0)
MCH: 30.4 pg (ref 26.0–34.0)
MCHC: 34.3 g/dL (ref 30.0–36.0)
MCV: 88.7 fL (ref 80.0–100.0)
Monocytes Absolute: 0.5 10*3/uL (ref 0.1–1.0)
Monocytes Relative: 11 %
Neutro Abs: 2.7 10*3/uL (ref 1.7–7.7)
Neutrophils Relative %: 67 %
Platelet Count: 168 10*3/uL (ref 150–400)
RBC: 4.41 MIL/uL (ref 4.22–5.81)
RDW: 13.1 % (ref 11.5–15.5)
WBC Count: 4.1 10*3/uL (ref 4.0–10.5)
nRBC: 0 % (ref 0.0–0.2)

## 2020-09-23 LAB — LACTATE DEHYDROGENASE: LDH: 151 U/L (ref 98–192)

## 2020-09-23 MED ORDER — SODIUM CHLORIDE 0.9% FLUSH
10.0000 mL | INTRAVENOUS | Status: DC | PRN
Start: 2020-09-23 — End: 2020-09-23
  Administered 2020-09-23: 10 mL
  Filled 2020-09-23: qty 10

## 2020-09-23 MED ORDER — SODIUM CHLORIDE 0.9 % IV SOLN
200.0000 mg | Freq: Once | INTRAVENOUS | Status: AC
  Administered 2020-09-23: 200 mg via INTRAVENOUS
  Filled 2020-09-23: qty 8

## 2020-09-23 MED ORDER — OXYCONTIN 30 MG PO T12A
30.0000 mg | EXTENDED_RELEASE_TABLET | Freq: Three times a day (TID) | ORAL | 0 refills | Status: DC
  Filled 2020-09-23: qty 90, 30d supply, fill #0

## 2020-09-23 MED ORDER — IOHEXOL 300 MG/ML  SOLN
100.0000 mL | Freq: Once | INTRAMUSCULAR | Status: AC | PRN
  Administered 2020-09-23: 100 mL via INTRAVENOUS

## 2020-09-23 MED ORDER — HYDROMORPHONE HCL 4 MG PO TABS
ORAL_TABLET | ORAL | 0 refills | Status: DC
Start: 2020-09-23 — End: 2020-10-07
  Filled 2020-09-23: qty 60, 5d supply, fill #0

## 2020-09-23 MED ORDER — SODIUM CHLORIDE 0.9 % IV SOLN
Freq: Once | INTRAVENOUS | Status: AC
  Filled 2020-09-23: qty 250

## 2020-09-23 MED ORDER — TEMAZEPAM 30 MG PO CAPS
30.0000 mg | ORAL_CAPSULE | Freq: Every evening | ORAL | 0 refills | Status: DC | PRN
  Filled 2020-09-23: qty 30, 30d supply, fill #0

## 2020-09-23 MED ORDER — HEPARIN SOD (PORK) LOCK FLUSH 100 UNIT/ML IV SOLN
500.0000 [IU] | Freq: Once | INTRAVENOUS | Status: AC | PRN
  Administered 2020-09-23: 500 [IU]
  Filled 2020-09-23: qty 5

## 2020-09-23 MED FILL — Gabapentin Cap 300 MG: ORAL | 30 days supply | Qty: 90 | Fill #1 | Status: AC

## 2020-09-23 NOTE — Addendum Note (Signed)
Addended by: Burney Gauze R on: 09/23/2020 03:25 PM   Modules accepted: Orders

## 2020-09-23 NOTE — Progress Notes (Signed)
Hematology and Oncology Follow Up Visit  Brian [name] [id number] 1975/07/02 45 y.o. 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 + Bilateral pulmonary emboli without right heart strain  DVT left popliteal vein    Past Therapy: Cabometyx 40 mg po q day -- start on 04/17/2019 - d/c on 07/17/2019 for progression Lenvima 24 mg po q day -- on hold due to proteinuria Taxotere/CDDP -- s/p cycle 4 - started on 09/13/2019   Current Therapy:     Gavreto 400 mg po q day -- started on 05/17/2020  -- d/c on 06/16/2020 Adriamycin weekly 2 weeks on 1 week off - s/p cycle 8 --d/c on 04/16/2020 due to progression XRT for LEFT hip met Pembrolizumab 200 mg IV q 3 week -- s/p cycle #4 - start on 06/26/2020 Zometa 4 mg IV q 3 months - next dose on 10/2020 Eliquis 5 mg po BID   Interim History:  Brian [name] is here today for follow-up.  He had a CAT scan done today.  We do not have the results back as of yet.  We also are awaiting the MRI and PET scan.  Both of these will be done on Thursday.  He had a decent weekend.  He does seem to have a good day and then has a bad 1 or 2 days.  His pain seems to be doing pretty well.  He denies this pain is lower back.  I am not sure exactly what that is.  I would not think it is from malignancy.  However, this is always a consideration.  His appetite is doing okay.  He is on Eliquis for the thromboembolic disease.  He has had no problems with bleeding.  He has had no issues with fever.  He has had no issues with leg swelling.  His blood pressure is doing a whole lot better.  There is been no problems with rashes.  He is urinating okay.  Overall, I would say his performance status is ECOG 1.     Medications:  Allergies as of 09/23/2020       Reactions   Dextromethorphan-guaifenesin Other (See Comments)   Irregular heartbeat   Doxycycline Anaphylaxis, Nausea And Vomiting, Rash, Other (See Comments)    "heart arrythmia" and "dyspepsia" (only oral doxycycline causes reaction)   Gadobutrol Hives, Other (See Comments)   Patient had MRI scan at [place]. Patient called one hour after he left imaging facility to report two "blisters" that came up on "back" of lip.    Gadolinium Derivatives Hives   Patient had MRI scan at [place]. Patient called one hour after he left imaging facility to report two "blisters" that came up on "back" of lip.    Guaifenesin Palpitations      Ibuprofen Hives, Itching   Lisinopril Other (See Comments)   Angioedema   Pantoprazole Itching   Tramadol Hives, Itching   Barium Rash   Developed redness around neck after drinking 1st bottle of Barocat; pt was given Benedryl by ED Mds to be "on the safe side" before drinking 2nd bottle   Chlorhexidine Hives        Medication List        Accurate as of September 23, 2020 11:36 AM. If you have any questions, ask your nurse or doctor.          cyclobenzaprine 10 MG tablet Commonly known as: FLEXERIL Take 1 tablet (10 mg  total) by mouth 3 (three) times daily as needed.   diphenhydrAMINE 25 mg capsule Commonly known as: BENADRYL TAKE 1 CAPSULE BY MOUTH 1 HOUR BEFORE CT SCAN   dronabinol 5 MG capsule Commonly known as: MARINOL TAKE 1 CAPSULE (5 MG TOTAL) BY MOUTH 2 (TWO) TIMES DAILY BEFORE A MEAL.   Eliquis 5 MG Tabs tablet Generic drug: apixaban TAKE 1 TABLET (5 MG TOTAL) BY MOUTH 2 (TWO) TIMES DAILY.   EPINEPHrine 0.3 mg/0.3 mL Soaj injection Commonly known as: EpiPen 2-Pak USE AS DIRECTED FOR LIFE THREATENING ALLERGIC REACTIONS   famotidine 40 MG tablet Commonly known as: PEPCID Take 1 tablet (40 mg total) by mouth 2 (two) times daily.   furosemide 80 MG tablet Commonly known as: LASIX TAKE 1 TABLET (80 MG TOTAL) BY MOUTH DAILY.   Fusion Plus Caps Take 1 capsule by mouth daily.   gabapentin 300 MG capsule Commonly known as: NEURONTIN TAKE 1 CAPSULE BY MOUTH THREE TIMES  DAILY   HYDROmorphone 4 MG tablet Commonly known as: DILAUDID Take one to two tablets by mouth every four hours as needed for pain   levothyroxine 200 MCG tablet Commonly known as: SYNTHROID Take one tablet (200 mcg dose) by mouth daily. Total:  288 mcg/day.   levothyroxine 88 MCG tablet Commonly known as: SYNTHROID Take one tablet (88 mcg dose) by mouth daily. Total:  288 mcg/day.   LORazepam 1 MG tablet Commonly known as: ATIVAN Take 1 tablet (1 mg total) by mouth every 6 (six) hours as needed for anxiety.   losartan 25 MG tablet Commonly known as: COZAAR Take 25 mg by mouth as needed.   methylphenidate 10 MG tablet Commonly known as: RITALIN Take 1 tablet (10 mg total) by mouth 2 (two) times daily.   metoCLOPramide 10 MG tablet Commonly known as: REGLAN Take 1 tablet (10 mg total) by mouth every 6 (six) hours as needed for nausea or vomiting.   metolazone 5 MG tablet Commonly known as: ZAROXOLYN TAKE 1 TABLET (5 MG TOTAL) BY MOUTH DAILY. TAKE 1 HOUR BEFORE LASIX.   multivitamin capsule Take 1 capsule by mouth daily.   OLANZapine 10 MG tablet Commonly known as: ZYPREXA TAKE 1 TABLET (10 MG TOTAL) BY MOUTH AT BEDTIME.   ondansetron 8 MG tablet Commonly known as: Zofran Take 1 tablet (8 mg total) by mouth 2 (two) times daily as needed (Nausea or vomiting).   OxyCONTIN 30 MG 12 hr tablet Generic drug: oxyCODONE Take 1 tablet (30 mg total) by mouth every 8 (eight) hours.   polyethylene glycol 17 g packet Commonly known as: MIRALAX / GLYCOLAX Take 17 g by mouth daily as needed for mild constipation.   potassium chloride 10 MEQ tablet Commonly known as: KLOR-CON TAKE 1 TABLET BY MOUTH ONCE DAILY   predniSONE 50 MG tablet Commonly known as: DELTASONE TAKE 1 TABLET BY MOUTH 13 HOURS BEFORE CT SCAN, 1 TABLET 7 HOURS BEFORE CT SCAN, AND 1 TABLET 1 HOUR BEFORE CT SCAN   prochlorperazine 10 MG tablet Commonly known as: COMPAZINE Take 1 tablet (10 mg total) by  mouth every 6 (six) hours as needed (Nausea or vomiting).   promethazine 25 MG tablet Commonly known as: PHENERGAN TAKE 1 TABLET BY MOUTH EVERY 6 HOURS AS NEEDED FOR NAUSEA & VOMITING   senna-docusate 8.6-50 MG tablet Commonly known as: Senokot-S Take 1 tablet by mouth at bedtime as needed for mild constipation.        Allergies:  Allergies  Allergen Reactions  Dextromethorphan-Guaifenesin Other (See Comments)    Irregular heartbeat    Doxycycline Anaphylaxis, Nausea And Vomiting, Rash and Other (See Comments)    "heart arrythmia" and "dyspepsia" (only oral doxycycline causes reaction)   Gadobutrol Hives and Other (See Comments)    Patient had MRI scan at [place]. Patient called one hour after he left imaging facility to report two "blisters" that came up on "back" of lip.     Gadolinium Derivatives Hives    Patient had MRI scan at [place]. Patient called one hour after he left imaging facility to report two "blisters" that came up on "back" of lip.    Guaifenesin Palpitations        Ibuprofen Hives and Itching   Lisinopril Other (See Comments)    Angioedema   Pantoprazole Itching   Tramadol Hives and Itching   Barium Rash    Developed redness around neck after drinking 1st bottle of Barocat; pt was given Benedryl by ED Mds to be "on the safe side" before drinking 2nd bottle   Chlorhexidine Hives    Past Medical History, Surgical history, Social history, and Family History were reviewed and updated.  Review of Systems: Review of Systems  Constitutional:  Positive for malaise/fatigue.  HENT: Negative.    Eyes: Negative.   Respiratory: Negative.    Cardiovascular: Negative.   Gastrointestinal:  Positive for abdominal pain, constipation, heartburn and nausea.  Genitourinary: Negative.   Musculoskeletal:  Positive for joint pain.  Skin: Negative.   Neurological: Negative.   Endo/Heme/Allergies: Negative.   Psychiatric/Behavioral: Negative.       Physical Exam:  weight is 270 lb (122.5 kg). His oral temperature is 98.2 F (36.8 C). His blood pressure is 126/90 and his pulse is 78. His respiration is 17 and oxygen saturation is 97%.   Wt Readings from Last 3 Encounters:  09/23/20 270 lb (122.5 kg)  09/02/20 272 lb (123.4 kg)  08/08/20 275 lb (124.7 kg)    Physical Exam Vitals reviewed.  HENT:     Head: Normocephalic and atraumatic.  Eyes:     Pupils: Pupils are equal, round, and reactive to light.  Cardiovascular:     Rate and Rhythm: Normal rate and regular rhythm.     Heart sounds: Normal heart sounds.  Pulmonary:     Effort: Pulmonary effort is normal.     Breath sounds: Normal breath sounds.  Abdominal:     General: Bowel sounds are normal.     Palpations: Abdomen is soft.  Musculoskeletal:        General: No tenderness or deformity. Normal range of motion.     Cervical back: Normal range of motion.  Lymphadenopathy:     Cervical: No cervical adenopathy.  Skin:    General: Skin is warm and dry.     Findings: No erythema or rash.  Neurological:     Mental Status: He is alert and oriented to person, place, and time.  Psychiatric:        Behavior: Behavior normal.        Thought Content: Thought content normal.        Judgment: Judgment normal.     Lab Results  Component Value Date   WBC 4.1 09/23/2020   HGB 13.4 09/23/2020   HCT 39.1 09/23/2020   MCV 88.7 09/23/2020   PLT 168 09/23/2020   Lab Results  Component Value Date   FERRITIN 775 (H) 08/08/2020   IRON 67 08/08/2020   TIBC 214 08/08/2020  UIBC 147 08/08/2020   IRONPCTSAT 31 08/08/2020   Lab Results  Component Value Date   RETICCTPCT 3.9 (H) 08/08/2020   RBC 4.41 09/23/2020   No results found for: KPAFRELGTCHN, LAMBDASER, KAPLAMBRATIO No results found for: IGGSERUM, IGA, IGMSERUM No results found for: Odetta Pink, SPEI   Chemistry      Component Value Date/Time   NA 137  09/23/2020 0911   NA 141 02/06/2019 1210   K 4.0 09/23/2020 0911   CL 103 09/23/2020 0911   CO2 27 09/23/2020 0911   BUN 16 09/23/2020 0911   BUN 8 02/06/2019 1210   CREATININE 0.97 09/23/2020 0911      Component Value Date/Time   CALCIUM 9.4 09/23/2020 0911   ALKPHOS 75 09/23/2020 0911   AST 20 09/23/2020 0911   ALT 24 09/23/2020 0911   BILITOT 0.4 09/23/2020 0911       Impression and Plan: Brian [name] is a very pleasant 45 yo caucasian gentleman with an 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   He now is on immunotherapy.  I really hope that the immunotherapy is working.  He does not need to have the CT scan report back.  We will go ahead and treat regardless.  His performance status is doing quite well.  He really has had no toxicity from the [facility].  I am just happy that his quality of life is doing well.  I know this is what he likes.  We will go ahead and plan for another follow-up in 3 weeks.      [name], MD 8/8/202211:36 AM

## 2020-09-23 NOTE — Patient Instructions (Signed)

## 2020-09-24 ENCOUNTER — Encounter: Payer: Self-pay | Admitting: *Deleted

## 2020-09-26 ENCOUNTER — Ambulatory Visit (HOSPITAL_COMMUNITY)
Admission: RE | Admit: 2020-09-26 | Discharge: 2020-09-26 | Disposition: A | Discharge status: Home / Self Care | Payer: 59 | Source: Ambulatory Visit | Attending: Hematology & Oncology | Admitting: Hematology & Oncology

## 2020-09-26 ENCOUNTER — Other Ambulatory Visit: Payer: Self-pay

## 2020-09-26 DIAGNOSIS — C799 Secondary malignant neoplasm of unspecified site: Secondary | ICD-10-CM | POA: Insufficient documentation

## 2020-09-26 DIAGNOSIS — I700 Atherosclerosis of aorta: Secondary | ICD-10-CM | POA: Diagnosis not present

## 2020-09-26 DIAGNOSIS — C73 Malignant neoplasm of thyroid gland: Secondary | ICD-10-CM | POA: Diagnosis not present

## 2020-09-26 DIAGNOSIS — C7951 Secondary malignant neoplasm of bone: Secondary | ICD-10-CM | POA: Diagnosis not present

## 2020-09-26 DIAGNOSIS — K769 Liver disease, unspecified: Secondary | ICD-10-CM | POA: Diagnosis not present

## 2020-09-26 LAB — GLUCOSE, CAPILLARY: Glucose-Capillary: 135 mg/dL — ABNORMAL HIGH (ref 70–99)

## 2020-09-26 MED ORDER — HEPARIN SOD (PORK) LOCK FLUSH 100 UNIT/ML IV SOLN
500.0000 [IU] | INTRAVENOUS | Status: DC | PRN
Start: 2020-09-26 — End: 2020-09-27

## 2020-09-26 MED ORDER — FLUDEOXYGLUCOSE F - 18 (FDG) INJECTION
14.4000 | Freq: Once | INTRAVENOUS | Status: AC
  Administered 2020-09-26: 14.4 via INTRAVENOUS

## 2020-09-26 MED ORDER — GADOBUTROL 1 MMOL/ML IV SOLN
10.0000 mL | Freq: Once | INTRAVENOUS | Status: AC | PRN
  Administered 2020-09-26: 10 mL via INTRAVENOUS

## 2020-09-30 ENCOUNTER — Other Ambulatory Visit (HOSPITAL_BASED_OUTPATIENT_CLINIC_OR_DEPARTMENT_OTHER): Payer: Self-pay

## 2020-09-30 LAB — THYROGLOBULIN LEVEL: Thyroglobulin: 19 ng/mL

## 2020-10-07 ENCOUNTER — Other Ambulatory Visit: Payer: Self-pay | Admitting: Hematology & Oncology

## 2020-10-07 ENCOUNTER — Other Ambulatory Visit (HOSPITAL_BASED_OUTPATIENT_CLINIC_OR_DEPARTMENT_OTHER): Payer: Self-pay

## 2020-10-07 ENCOUNTER — Telehealth: Payer: Self-pay

## 2020-10-07 DIAGNOSIS — C73 Malignant neoplasm of thyroid gland: Secondary | ICD-10-CM

## 2020-10-07 MED ORDER — OXYCONTIN 30 MG PO T12A
30.0000 mg | EXTENDED_RELEASE_TABLET | Freq: Three times a day (TID) | ORAL | 0 refills | Status: DC
  Filled 2020-10-07: qty 90, 30d supply, fill #0

## 2020-10-07 MED ORDER — HYDROMORPHONE HCL 4 MG PO TABS
ORAL_TABLET | ORAL | 0 refills | Status: DC
Start: 2020-10-07 — End: 2020-11-04
  Filled 2020-10-07: qty 60, 5d supply, fill #0

## 2020-10-07 MED ORDER — METHYLPHENIDATE HCL 10 MG PO TABS
10.0000 mg | ORAL_TABLET | Freq: Two times a day (BID) | ORAL | 0 refills | Status: DC
  Filled 2020-10-07: qty 60, 30d supply, fill #0

## 2020-10-07 MED ORDER — APIXABAN 5 MG PO TABS
ORAL_TABLET | Freq: Two times a day (BID) | ORAL | 6 refills | Status: DC
Start: 2020-10-07 — End: 2020-11-04
  Filled 2020-10-07: qty 60, 30d supply, fill #0

## 2020-10-07 NOTE — Telephone Encounter (Signed)
Pt called in and is wanting to know if Dr Marin Olp will send in a prescription to [facility] for an electric scooter. Spoke with pt who states he is no longer able to walk long distances and is interested in an Transport planner. This message will be given to Dr Marin Olp.

## 2020-10-08 ENCOUNTER — Encounter: Payer: Self-pay | Admitting: *Deleted

## 2020-10-09 ENCOUNTER — Other Ambulatory Visit (HOSPITAL_BASED_OUTPATIENT_CLINIC_OR_DEPARTMENT_OTHER): Payer: Self-pay

## 2020-10-09 NOTE — Telephone Encounter (Signed)
Chart review.

## 2020-10-14 ENCOUNTER — Inpatient Hospital Stay: Payer: 59

## 2020-10-14 ENCOUNTER — Encounter: Payer: Self-pay | Admitting: Hematology & Oncology

## 2020-10-14 ENCOUNTER — Other Ambulatory Visit: Payer: Self-pay

## 2020-10-14 ENCOUNTER — Other Ambulatory Visit (HOSPITAL_BASED_OUTPATIENT_CLINIC_OR_DEPARTMENT_OTHER): Payer: Self-pay

## 2020-10-14 ENCOUNTER — Other Ambulatory Visit: Payer: Self-pay | Admitting: *Deleted

## 2020-10-14 ENCOUNTER — Telehealth: Payer: Self-pay

## 2020-10-14 ENCOUNTER — Inpatient Hospital Stay (HOSPITAL_BASED_OUTPATIENT_CLINIC_OR_DEPARTMENT_OTHER): Payer: 59 | Admitting: Hematology & Oncology

## 2020-10-14 VITALS — BP 123/87 | HR 110 | Temp 98.4°F | Resp 20 | Wt 270.1 lb

## 2020-10-14 VITALS — HR 100

## 2020-10-14 DIAGNOSIS — C787 Secondary malignant neoplasm of liver and intrahepatic bile duct: Secondary | ICD-10-CM

## 2020-10-14 DIAGNOSIS — D509 Iron deficiency anemia, unspecified: Secondary | ICD-10-CM

## 2020-10-14 DIAGNOSIS — C73 Malignant neoplasm of thyroid gland: Secondary | ICD-10-CM

## 2020-10-14 DIAGNOSIS — Z5112 Encounter for antineoplastic immunotherapy: Secondary | ICD-10-CM | POA: Diagnosis not present

## 2020-10-14 DIAGNOSIS — C799 Secondary malignant neoplasm of unspecified site: Secondary | ICD-10-CM

## 2020-10-14 DIAGNOSIS — Z79899 Other long term (current) drug therapy: Secondary | ICD-10-CM | POA: Diagnosis not present

## 2020-10-14 LAB — CMP (CANCER CENTER ONLY)
ALT: 27 U/L (ref 0–44)
AST: 28 U/L (ref 15–41)
Albumin: 3.9 g/dL (ref 3.5–5.0)
Alkaline Phosphatase: 77 U/L (ref 38–126)
Anion gap: 7 (ref 5–15)
BUN: 21 mg/dL — ABNORMAL HIGH (ref 6–20)
CO2: 28 mmol/L (ref 22–32)
Calcium: 9.7 mg/dL (ref 8.9–10.3)
Chloride: 101 mmol/L (ref 98–111)
Creatinine: 1.14 mg/dL (ref 0.61–1.24)
GFR, Estimated: >60 mL/min (ref >60)
Glucose, Bld: 108 mg/dL — ABNORMAL HIGH (ref 70–99)
Potassium: 4.2 mmol/L (ref 3.5–5.1)
Sodium: 136 mmol/L (ref 135–145)
Total Bilirubin: 0.6 mg/dL (ref 0.3–1.2)
Total Protein: 6.6 g/dL (ref 6.5–8.1)

## 2020-10-14 LAB — CBC WITH DIFFERENTIAL (CANCER CENTER ONLY)
Abs Immature Granulocytes: 0.01 10*3/uL (ref 0.00–0.07)
Basophils Absolute: 0 10*3/uL (ref 0.0–0.1)
Basophils Relative: 1 %
Eosinophils Absolute: 0.1 10*3/uL (ref 0.0–0.5)
Eosinophils Relative: 2 %
HCT: 37.4 % — ABNORMAL LOW (ref 39.0–52.0)
Hemoglobin: 12.8 g/dL — ABNORMAL LOW (ref 13.0–17.0)
Immature Granulocytes: 0 %
Lymphocytes Relative: 16 %
Lymphs Abs: 0.5 10*3/uL — ABNORMAL LOW (ref 0.7–4.0)
MCH: 30.1 pg (ref 26.0–34.0)
MCHC: 34.2 g/dL (ref 30.0–36.0)
MCV: 88 fL (ref 80.0–100.0)
Monocytes Absolute: 0.4 10*3/uL (ref 0.1–1.0)
Monocytes Relative: 11 %
Neutro Abs: 2.4 10*3/uL (ref 1.7–7.7)
Neutrophils Relative %: 70 %
Platelet Count: 170 10*3/uL (ref 150–400)
RBC: 4.25 MIL/uL (ref 4.22–5.81)
RDW: 13.1 % (ref 11.5–15.5)
WBC Count: 3.4 10*3/uL — ABNORMAL LOW (ref 4.0–10.5)
nRBC: 0 % (ref 0.0–0.2)

## 2020-10-14 LAB — LACTATE DEHYDROGENASE: LDH: 221 U/L — ABNORMAL HIGH (ref 98–192)

## 2020-10-14 MED ORDER — SODIUM CHLORIDE 0.9 % IV SOLN: Freq: Once | INTRAVENOUS | Status: AC

## 2020-10-14 MED ORDER — TEMAZEPAM 30 MG PO CAPS
30.0000 mg | ORAL_CAPSULE | Freq: Every evening | ORAL | 0 refills | Status: DC | PRN
  Filled 2020-10-14: qty 30, 30d supply, fill #0

## 2020-10-14 MED ORDER — HEPARIN SOD (PORK) LOCK FLUSH 100 UNIT/ML IV SOLN
500.0000 [IU] | Freq: Once | INTRAVENOUS | Status: AC | PRN
  Administered 2020-10-14: 500 [IU]

## 2020-10-14 MED ORDER — LORAZEPAM 1 MG PO TABS
1.0000 mg | ORAL_TABLET | Freq: Four times a day (QID) | ORAL | 0 refills | Status: DC | PRN
  Filled 2020-10-14: qty 30, 8d supply, fill #0

## 2020-10-14 MED ORDER — LORAZEPAM 1 MG PO TABS
1.0000 mg | ORAL_TABLET | Freq: Four times a day (QID) | ORAL | 0 refills | Status: DC | PRN
Start: 2020-10-14 — End: 2020-10-14

## 2020-10-14 MED ORDER — SODIUM CHLORIDE 0.9 % IV SOLN
200.0000 mg | Freq: Once | INTRAVENOUS | Status: AC
  Administered 2020-10-14: 200 mg via INTRAVENOUS
  Filled 2020-10-14: qty 8

## 2020-10-14 MED ORDER — TEMAZEPAM 30 MG PO CAPS: 30.0000 mg | ORAL_CAPSULE | Freq: Every evening | ORAL | 0 refills | Status: DC | PRN

## 2020-10-14 MED ORDER — SODIUM CHLORIDE 0.9 % IV SOLN: Freq: Once | INTRAVENOUS | Status: DC

## 2020-10-14 MED ORDER — SODIUM CHLORIDE 0.9 % IV SOLN
200.0000 mg | Freq: Once | INTRAVENOUS | Status: DC
  Filled 2020-10-14: qty 10

## 2020-10-14 MED ORDER — ZOLEDRONIC ACID 4 MG/100ML IV SOLN
4.0000 mg | Freq: Once | INTRAVENOUS | Status: AC
  Administered 2020-10-14: 4 mg via INTRAVENOUS
  Filled 2020-10-14: qty 100

## 2020-10-14 MED ORDER — SODIUM CHLORIDE 0.9% FLUSH
10.0000 mL | INTRAVENOUS | Status: DC | PRN
  Administered 2020-10-14: 10 mL

## 2020-10-14 NOTE — Telephone Encounter (Signed)
Appt s made per 8/2/922 los and pt to gain updated sch at [facility]

## 2020-10-14 NOTE — Progress Notes (Signed)
Hematology and Oncology Follow Up Visit  Brian [name] [id number] 1975/10/18 45 y.o. 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 + Bilateral pulmonary emboli without right heart strain  DVT left popliteal vein    Past Therapy: Cabometyx 40 mg po q day -- start on 04/17/2019 - d/c on 07/17/2019 for progression Lenvima 24 mg po q day -- on hold due to proteinuria Taxotere/CDDP -- s/p cycle 4 - started on 09/13/2019   Current Therapy:     Gavreto 400 mg po q day -- started on 05/17/2020  -- d/c on 06/16/2020 Adriamycin weekly 2 weeks on 1 week off - s/p cycle 8 --d/c on 04/16/2020 due to progression XRT for LEFT hip met Pembrolizumab 200 mg IV q 3 week -- s/p cycle #4 - start on 06/26/2020 Zometa 4 mg IV q 3 months - next dose on 10/2020 Eliquis 5 mg po BID   Interim History:  Brian [name] is here today for follow-up.  He really does look quite good.  Had a nice weekend.  The big news is that he and his family are going to [place] in late September.  They will be leaving on the 23rd.  I am sure that he will have a wonderful time down there.  I see no problems with him going down to [place].  We recently rescan him.  He had a MRI on 09/26/2020.  This showed a mixed response.  Overall, I would have to say that everything is relatively stable.  Some lesions are smaller and some lesions are little bit larger.  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  On a PET scan, he does have some areas that appear to be progressive.  Overall, his quality of life is doing really well.  His pain control is doing nicely.  He is more functional.  Again, I do not see any problems with him going to [employer].  I think this would really be good for him.  He has had no problems with cough.  He has had no issues with rashes.  There is been no leg swelling.  He has had no change in bowel or bladder habits.  He has had no  headache.  He is on Eliquis.  He is doing well on the Eliquis.  There is no bleeding.  Overall, I would say his performance status is ECOG 1.   Medications:  Allergies as of 10/14/2020       Reactions   Dextromethorphan-guaifenesin Other (See Comments)   Irregular heartbeat   Doxycycline Anaphylaxis, Nausea And Vomiting, Rash, Other (See Comments)   "heart arrythmia" and "dyspepsia" (only oral doxycycline causes reaction)   Gadobutrol Hives, Other (See Comments)   Patient had MRI scan at [place]. Patient called one hour after he left imaging facility to report two "blisters" that came up on "back" of lip.    Gadolinium Derivatives Hives   Patient had MRI scan at [place]. Patient called one hour after he left imaging facility to report two "blisters" that came up on "back" of lip.    Guaifenesin Palpitations      Ibuprofen Hives, Itching   Lisinopril Other (See Comments)   Angioedema   Pantoprazole Itching   Tramadol Hives, Itching   Barium Rash   Developed redness around neck after drinking 1st bottle of Barocat; pt was given Benedryl by ED Mds to be "on  the safe side" before drinking 2nd bottle   Chlorhexidine Hives        Medication List        Accurate as of October 14, 2020 10:46 AM. If you have any questions, ask your nurse or doctor.          cyclobenzaprine 10 MG tablet Commonly known as: FLEXERIL Take 1 tablet (10 mg total) by mouth 3 (three) times daily as needed.   diphenhydrAMINE 25 mg capsule Commonly known as: BENADRYL TAKE 1 CAPSULE BY MOUTH 1 HOUR BEFORE CT SCAN   dronabinol 5 MG capsule Commonly known as: MARINOL TAKE 1 CAPSULE (5 MG TOTAL) BY MOUTH 2 (TWO) TIMES DAILY BEFORE A MEAL.   Eliquis 5 MG Tabs tablet Generic drug: apixaban TAKE 1 TABLET (5 MG TOTAL) BY MOUTH 2 (TWO) TIMES DAILY.   EPINEPHrine 0.3 mg/0.3 mL Soaj injection Commonly known as: EpiPen 2-Pak USE AS DIRECTED FOR LIFE THREATENING ALLERGIC REACTIONS    famotidine 40 MG tablet Commonly known as: PEPCID Take 1 tablet (40 mg total) by mouth 2 (two) times daily.   furosemide 80 MG tablet Commonly known as: LASIX TAKE 1 TABLET (80 MG TOTAL) BY MOUTH DAILY.   Fusion Plus Caps Take 1 capsule by mouth daily.   gabapentin 300 MG capsule Commonly known as: NEURONTIN TAKE 1 CAPSULE BY MOUTH THREE TIMES DAILY   HYDROmorphone 4 MG tablet Commonly known as: DILAUDID Take one to two tablets by mouth every four hours as needed for pain   levothyroxine 200 MCG tablet Commonly known as: SYNTHROID Take one tablet (200 mcg dose) by mouth daily. Total:  288 mcg/day.   levothyroxine 88 MCG tablet Commonly known as: SYNTHROID Take one tablet (88 mcg dose) by mouth daily. Total:  288 mcg/day.   LORazepam 1 MG tablet Commonly known as: ATIVAN Take 1 tablet (1 mg total) by mouth every 6 (six) hours as needed for anxiety.   losartan 25 MG tablet Commonly known as: COZAAR Take 25 mg by mouth as needed.   methylphenidate 10 MG tablet Commonly known as: RITALIN Take 1 tablet (10 mg total) by mouth 2 (two) times daily.   metoCLOPramide 10 MG tablet Commonly known as: REGLAN Take 1 tablet (10 mg total) by mouth every 6 (six) hours as needed for nausea or vomiting.   metolazone 5 MG tablet Commonly known as: ZAROXOLYN TAKE 1 TABLET (5 MG TOTAL) BY MOUTH DAILY. TAKE 1 HOUR BEFORE LASIX.   multivitamin capsule Take 1 capsule by mouth daily.   OLANZapine 10 MG tablet Commonly known as: ZYPREXA TAKE 1 TABLET (10 MG TOTAL) BY MOUTH AT BEDTIME.   ondansetron 8 MG tablet Commonly known as: Zofran Take 1 tablet (8 mg total) by mouth 2 (two) times daily as needed (Nausea or vomiting).   OxyCONTIN 30 MG 12 hr tablet Generic drug: oxyCODONE Take 1 tablet (30 mg total) by mouth every 8 (eight) hours.   polyethylene glycol 17 g packet Commonly known as: MIRALAX / GLYCOLAX Take 17 g by mouth daily as needed for mild constipation.   potassium  chloride 10 MEQ tablet Commonly known as: KLOR-CON TAKE 1 TABLET BY MOUTH ONCE DAILY   predniSONE 50 MG tablet Commonly known as: DELTASONE TAKE 1 TABLET BY MOUTH 13 HOURS BEFORE CT SCAN, 1 TABLET 7 HOURS BEFORE CT SCAN, AND 1 TABLET 1 HOUR BEFORE CT SCAN   prochlorperazine 10 MG tablet Commonly known as: COMPAZINE Take 1 tablet (10 mg total) by mouth every 6 (  six) hours as needed (Nausea or vomiting).   promethazine 25 MG tablet Commonly known as: PHENERGAN TAKE 1 TABLET BY MOUTH EVERY 6 HOURS AS NEEDED FOR NAUSEA & VOMITING   senna-docusate 8.6-50 MG tablet Commonly known as: Senokot-S Take 1 tablet by mouth at bedtime as needed for mild constipation.   temazepam 30 MG capsule Commonly known as: RESTORIL Take 1 capsule (30 mg total) by mouth at bedtime as needed for sleep.        Allergies:  Allergies  Allergen Reactions   Dextromethorphan-Guaifenesin Other (See Comments)    Irregular heartbeat    Doxycycline Anaphylaxis, Nausea And Vomiting, Rash and Other (See Comments)    "heart arrythmia" and "dyspepsia" (only oral doxycycline causes reaction)   Gadobutrol Hives and Other (See Comments)    Patient had MRI scan at [place]. Patient called one hour after he left imaging facility to report two "blisters" that came up on "back" of lip.     Gadolinium Derivatives Hives    Patient had MRI scan at [place]. Patient called one hour after he left imaging facility to report two "blisters" that came up on "back" of lip.    Guaifenesin Palpitations        Ibuprofen Hives and Itching   Lisinopril Other (See Comments)    Angioedema   Pantoprazole Itching   Tramadol Hives and Itching   Barium Rash    Developed redness around neck after drinking 1st bottle of Barocat; pt was given Benedryl by ED Mds to be "on the safe side" before drinking 2nd bottle   Chlorhexidine Hives    Past Medical History, Surgical history, Social history, and Family History were  reviewed and updated.  Review of Systems: Review of Systems  Constitutional:  Positive for malaise/fatigue.  HENT: Negative.    Eyes: Negative.   Respiratory: Negative.    Cardiovascular: Negative.   Gastrointestinal:  Positive for abdominal pain, constipation, heartburn and nausea.  Genitourinary: Negative.   Musculoskeletal:  Positive for joint pain.  Skin: Negative.   Neurological: Negative.   Endo/Heme/Allergies: Negative.   Psychiatric/Behavioral: Negative.      Physical Exam:  weight is 270 lb 1.9 oz (122.5 kg). His oral temperature is 98.4 F (36.9 C). His blood pressure is 123/87 and his pulse is 110 (abnormal). His respiration is 20 and oxygen saturation is 98%.   Wt Readings from Last 3 Encounters:  10/14/20 270 lb 1.9 oz (122.5 kg)  10/14/20 270 lb 1.9 oz (122.5 kg)  09/23/20 270 lb (122.5 kg)    Physical Exam Vitals reviewed.  HENT:     Head: Normocephalic and atraumatic.  Eyes:     Pupils: Pupils are equal, round, and reactive to light.  Cardiovascular:     Rate and Rhythm: Normal rate and regular rhythm.     Heart sounds: Normal heart sounds.  Pulmonary:     Effort: Pulmonary effort is normal.     Breath sounds: Normal breath sounds.  Abdominal:     General: Bowel sounds are normal.     Palpations: Abdomen is soft.  Musculoskeletal:        General: No tenderness or deformity. Normal range of motion.     Cervical back: Normal range of motion.  Lymphadenopathy:     Cervical: No cervical adenopathy.  Skin:    General: Skin is warm and dry.     Findings: No erythema or rash.  Neurological:     Mental Status: He is alert and oriented  to person, place, and time.  Psychiatric:        Behavior: Behavior normal.        Thought Content: Thought content normal.        Judgment: Judgment normal.     Lab Results  Component Value Date   WBC 3.4 (L) 10/14/2020   HGB 12.8 (L) 10/14/2020   HCT 37.4 (L) 10/14/2020   MCV 88.0 10/14/2020   PLT 170  10/14/2020   Lab Results  Component Value Date   FERRITIN 775 (H) 08/08/2020   IRON 67 08/08/2020   TIBC 214 08/08/2020   UIBC 147 08/08/2020   IRONPCTSAT 31 08/08/2020   Lab Results  Component Value Date   RETICCTPCT 3.9 (H) 08/08/2020   RBC 4.25 10/14/2020   No results found for: KPAFRELGTCHN, LAMBDASER, KAPLAMBRATIO No results found for: IGGSERUM, IGA, IGMSERUM No results found for: Odetta Pink, SPEI   Chemistry      Component Value Date/Time   NA 136 10/14/2020 0952   NA 141 02/06/2019 1210   K 4.2 10/14/2020 0952   CL 101 10/14/2020 0952   CO2 28 10/14/2020 0952   BUN 21 (H) 10/14/2020 0952   BUN 8 02/06/2019 1210   CREATININE 1.14 10/14/2020 0952      Component Value Date/Time   CALCIUM 9.7 10/14/2020 0952   ALKPHOS 77 10/14/2020 0952   AST 28 10/14/2020 0952   ALT 27 10/14/2020 0952   BILITOT 0.6 10/14/2020 0952       Impression and Plan: Brian [name] is a very pleasant 45 yo caucasian gentleman with an 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   He now is on immunotherapy.  Overall, I really think that the immunotherapy is keeping everything stable.  His quality of life is better.  I think this is really what are focus has to be on.  I am glad that he is going to be able to go down to [employer].  I do not see a reason why he could not.  His labs look good.  His pain is under good control.  He is on blood thinner.  We will going continue on the pembrolizumab.  I will probably plan for 4 more cycles and then we can repeat her scans.  I am just grateful that he has such good support at home.  His faith remains incredibly strong.       [name], MD 8/29/202210:46 AM

## 2020-10-14 NOTE — Progress Notes (Signed)
OK to treat with heart rate of 100 per order of Dr. Marin Olp.

## 2020-10-29 ENCOUNTER — Other Ambulatory Visit: Payer: Self-pay

## 2020-11-04 ENCOUNTER — Other Ambulatory Visit: Payer: Self-pay

## 2020-11-04 ENCOUNTER — Inpatient Hospital Stay: Payer: 59 | Attending: Hematology & Oncology

## 2020-11-04 ENCOUNTER — Encounter: Payer: Self-pay | Admitting: *Deleted

## 2020-11-04 ENCOUNTER — Other Ambulatory Visit (HOSPITAL_BASED_OUTPATIENT_CLINIC_OR_DEPARTMENT_OTHER): Payer: Self-pay

## 2020-11-04 ENCOUNTER — Inpatient Hospital Stay (HOSPITAL_BASED_OUTPATIENT_CLINIC_OR_DEPARTMENT_OTHER): Payer: 59 | Admitting: Hematology & Oncology

## 2020-11-04 ENCOUNTER — Telehealth: Payer: Self-pay

## 2020-11-04 ENCOUNTER — Inpatient Hospital Stay: Payer: 59

## 2020-11-04 ENCOUNTER — Encounter: Payer: Self-pay | Admitting: Hematology & Oncology

## 2020-11-04 VITALS — BP 126/98 | HR 52 | Temp 98.0°F | Resp 17 | Wt 267.0 lb

## 2020-11-04 DIAGNOSIS — Z5112 Encounter for antineoplastic immunotherapy: Secondary | ICD-10-CM | POA: Insufficient documentation

## 2020-11-04 DIAGNOSIS — C73 Malignant neoplasm of thyroid gland: Secondary | ICD-10-CM

## 2020-11-04 DIAGNOSIS — Z79899 Other long term (current) drug therapy: Secondary | ICD-10-CM | POA: Diagnosis not present

## 2020-11-04 DIAGNOSIS — C787 Secondary malignant neoplasm of liver and intrahepatic bile duct: Secondary | ICD-10-CM

## 2020-11-04 DIAGNOSIS — C799 Secondary malignant neoplasm of unspecified site: Secondary | ICD-10-CM | POA: Diagnosis not present

## 2020-11-04 LAB — CBC WITH DIFFERENTIAL (CANCER CENTER ONLY)
Abs Immature Granulocytes: 0.01 10*3/uL (ref 0.00–0.07)
Basophils Absolute: 0 10*3/uL (ref 0.0–0.1)
Basophils Relative: 1 %
Eosinophils Absolute: 0.1 10*3/uL (ref 0.0–0.5)
Eosinophils Relative: 1 %
HCT: 36.3 % — ABNORMAL LOW (ref 39.0–52.0)
Hemoglobin: 12.4 g/dL — ABNORMAL LOW (ref 13.0–17.0)
Immature Granulocytes: 0 %
Lymphocytes Relative: 18 %
Lymphs Abs: 0.8 10*3/uL (ref 0.7–4.0)
MCH: 29.8 pg (ref 26.0–34.0)
MCHC: 34.2 g/dL (ref 30.0–36.0)
MCV: 87.3 fL (ref 80.0–100.0)
Monocytes Absolute: 0.5 10*3/uL (ref 0.1–1.0)
Monocytes Relative: 11 %
Neutro Abs: 2.8 10*3/uL (ref 1.7–7.7)
Neutrophils Relative %: 69 %
Platelet Count: 212 10*3/uL (ref 150–400)
RBC: 4.16 MIL/uL — ABNORMAL LOW (ref 4.22–5.81)
RDW: 13.1 % (ref 11.5–15.5)
WBC Count: 4.1 10*3/uL (ref 4.0–10.5)
nRBC: 0 % (ref 0.0–0.2)

## 2020-11-04 LAB — CMP (CANCER CENTER ONLY)
ALT: 17 U/L (ref 0–44)
AST: 19 U/L (ref 15–41)
Albumin: 4 g/dL (ref 3.5–5.0)
Alkaline Phosphatase: 92 U/L (ref 38–126)
Anion gap: 6 (ref 5–15)
BUN: 19 mg/dL (ref 6–20)
CO2: 28 mmol/L (ref 22–32)
Calcium: 9.4 mg/dL (ref 8.9–10.3)
Chloride: 104 mmol/L (ref 98–111)
Creatinine: 0.86 mg/dL (ref 0.61–1.24)
GFR, Estimated: >60 mL/min (ref >60)
Glucose, Bld: 105 mg/dL — ABNORMAL HIGH (ref 70–99)
Potassium: 4.1 mmol/L (ref 3.5–5.1)
Sodium: 138 mmol/L (ref 135–145)
Total Bilirubin: 0.4 mg/dL (ref 0.3–1.2)
Total Protein: 6.7 g/dL (ref 6.5–8.1)

## 2020-11-04 LAB — TSH: TSH: <0.08 u[IU]/mL — ABNORMAL LOW (ref 0.320–4.118)

## 2020-11-04 LAB — LACTATE DEHYDROGENASE: LDH: 184 U/L (ref 98–192)

## 2020-11-04 MED ORDER — HEPARIN SOD (PORK) LOCK FLUSH 100 UNIT/ML IV SOLN: 500.0000 [IU] | Freq: Once | INTRAVENOUS | Status: DC | PRN

## 2020-11-04 MED ORDER — HYDROMORPHONE HCL 4 MG PO TABS
ORAL_TABLET | ORAL | 0 refills | Status: DC
  Filled 2020-11-04: qty 60, fill #0
  Filled 2020-11-07: qty 60, 8d supply, fill #0

## 2020-11-04 MED ORDER — SODIUM CHLORIDE 0.9% FLUSH: 10.0000 mL | INTRAVENOUS | Status: DC | PRN

## 2020-11-04 MED ORDER — METHYLPHENIDATE HCL 10 MG PO TABS
10.0000 mg | ORAL_TABLET | Freq: Two times a day (BID) | ORAL | 0 refills | Status: DC
  Filled 2020-11-04 – 2020-11-07 (×2): qty 60, 30d supply, fill #0

## 2020-11-04 MED ORDER — LORAZEPAM 1 MG PO TABS
1.0000 mg | ORAL_TABLET | Freq: Four times a day (QID) | ORAL | 0 refills | Status: DC | PRN
  Filled 2020-11-04: qty 30, 8d supply, fill #0

## 2020-11-04 MED ORDER — TEMAZEPAM 30 MG PO CAPS
30.0000 mg | ORAL_CAPSULE | Freq: Every evening | ORAL | 0 refills | Status: DC | PRN
  Filled 2020-11-04 – 2020-11-07 (×2): qty 30, 30d supply, fill #0

## 2020-11-04 MED ORDER — APIXABAN 5 MG PO TABS
ORAL_TABLET | Freq: Two times a day (BID) | ORAL | 6 refills | Status: DC
  Filled 2020-11-04: qty 60, fill #0
  Filled 2020-11-07: qty 60, 30d supply, fill #0
  Filled 2020-12-16: qty 60, 30d supply, fill #1
  Filled 2021-01-13: qty 60, 30d supply, fill #2
  Filled 2021-02-18: qty 60, 30d supply, fill #3
  Filled 2021-04-02: qty 60, 30d supply, fill #4
  Filled 2021-05-07: qty 60, 30d supply, fill #5
  Filled 2021-06-06: qty 60, 30d supply, fill #6

## 2020-11-04 MED ORDER — SODIUM CHLORIDE 0.9 % IV SOLN: Freq: Once | INTRAVENOUS | Status: AC

## 2020-11-04 MED ORDER — OXYCONTIN 30 MG PO T12A
30.0000 mg | EXTENDED_RELEASE_TABLET | Freq: Three times a day (TID) | ORAL | 0 refills | Status: DC
  Filled 2020-11-04: qty 90, 30d supply, fill #0

## 2020-11-04 MED ORDER — SODIUM CHLORIDE 0.9 % IV SOLN
200.0000 mg | Freq: Once | INTRAVENOUS | Status: DC
  Administered 2020-11-04: 200 mg via INTRAVENOUS
  Filled 2020-11-04: qty 8

## 2020-11-04 NOTE — Progress Notes (Signed)
Hematology and Oncology Follow Up Visit  Brian [name] [id number] September 21, 1975 45 y.o. 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 + Bilateral pulmonary emboli without right heart strain  DVT left popliteal vein    Past Therapy: Cabometyx 40 mg po q day -- start on 04/17/2019 - d/c on 07/17/2019 for progression Lenvima 24 mg po q day -- on hold due to proteinuria Taxotere/CDDP -- s/p cycle 4 - started on 09/13/2019   Current Therapy:     Gavreto 400 mg po q day -- started on 05/17/2020  -- d/c on 06/16/2020 Adriamycin weekly 2 weeks on 1 week off - s/p cycle 8 --d/c on 04/16/2020 due to progression XRT for LEFT hip met Pembrolizumab 200 mg IV q 3 week -- s/p cycle #5 - start on 06/26/2020 Zometa 4 mg IV q 3 months - next dose on 01/2021 Eliquis 5 mg po BID   Interim History:  Brian [name] is here today for follow-up.  He and his family will be going to [employer] this weekend.  I am sure that they will have a nice time.  He feels okay.  He has occasional cold sweats.  I think this might just be from the immunotherapy that he is taking.  He does have occasional fatigue.  Has some occasional bony pain.  His appetite has been pretty good.  He has had no change in bowel or bladder habits.  He has had no problems with diarrhea.  There is been no issues with rashes.  He has had little bit of leg swelling but again this is chronic for him.  I would have to to say that I think his overall performance status not that bad.  He does have periods in which she does feel good.  I would like to hope that when he is down in [facility], that he will feel well so he can enjoy all the activities.  He is doing well on the Eliquis.  Is been no problems with bleeding.  Currently, his performance status is ECOG 1.     Medications:  Allergies as of 11/04/2020       Reactions   Dextromethorphan-guaifenesin Other (See Comments)   Irregular  heartbeat   Doxycycline Anaphylaxis, Nausea And Vomiting, Rash, Other (See Comments)   "heart arrythmia" and "dyspepsia" (only oral doxycycline causes reaction)   Gadobutrol Hives, Other (See Comments)   Patient had MRI scan at [place]. Patient called one hour after he left imaging facility to report two "blisters" that came up on "back" of lip.    Gadolinium Derivatives Hives   Patient had MRI scan at [place]. Patient called one hour after he left imaging facility to report two "blisters" that came up on "back" of lip.    Guaifenesin Palpitations      Ibuprofen Hives, Itching   Lisinopril Other (See Comments)   Angioedema   Pantoprazole Itching   Tramadol Hives, Itching   Barium Rash   Developed redness around neck after drinking 1st bottle of Barocat; pt was given Benedryl by ED Mds to be "on the safe side" before drinking 2nd bottle   Chlorhexidine Hives        Medication List        Accurate as of November 04, 2020 12:36 PM. If you have any questions, ask your nurse or doctor.          cyclobenzaprine 10 MG tablet  Commonly known as: FLEXERIL Take 1 tablet (10 mg total) by mouth 3 (three) times daily as needed.   diphenhydrAMINE 25 mg capsule Commonly known as: BENADRYL TAKE 1 CAPSULE BY MOUTH 1 HOUR BEFORE CT SCAN   dronabinol 5 MG capsule Commonly known as: MARINOL TAKE 1 CAPSULE (5 MG TOTAL) BY MOUTH 2 (TWO) TIMES DAILY BEFORE A MEAL.   Eliquis 5 MG Tabs tablet Generic drug: apixaban TAKE 1 TABLET (5 MG TOTAL) BY MOUTH 2 (TWO) TIMES DAILY.   EPINEPHrine 0.3 mg/0.3 mL Soaj injection Commonly known as: EpiPen 2-Pak USE AS DIRECTED FOR LIFE THREATENING ALLERGIC REACTIONS   famotidine 40 MG tablet Commonly known as: PEPCID Take 1 tablet (40 mg total) by mouth 2 (two) times daily.   furosemide 80 MG tablet Commonly known as: LASIX TAKE 1 TABLET (80 MG TOTAL) BY MOUTH DAILY. What changed:  when to take this reasons to take this    Fusion Plus Caps Take 1 capsule by mouth daily.   gabapentin 300 MG capsule Commonly known as: NEURONTIN TAKE 1 CAPSULE BY MOUTH THREE TIMES DAILY   HYDROmorphone 4 MG tablet Commonly known as: DILAUDID Take one to two tablets by mouth every four hours as needed for pain   levothyroxine 200 MCG tablet Commonly known as: SYNTHROID Take one tablet (200 mcg dose) by mouth daily. Total:  288 mcg/day.   levothyroxine 88 MCG tablet Commonly known as: SYNTHROID Take one tablet (88 mcg dose) by mouth daily. Total:  288 mcg/day.   LORazepam 1 MG tablet Commonly known as: ATIVAN Take 1 tablet (1 mg total) by mouth every 6 (six) hours as needed for anxiety.   losartan 25 MG tablet Commonly known as: COZAAR Take 25 mg by mouth as needed.   methylphenidate 10 MG tablet Commonly known as: RITALIN Take 1 tablet (10 mg total) by mouth 2 (two) times daily.   metoCLOPramide 10 MG tablet Commonly known as: REGLAN Take 1 tablet (10 mg total) by mouth every 6 (six) hours as needed for nausea or vomiting.   metolazone 5 MG tablet Commonly known as: ZAROXOLYN TAKE 1 TABLET (5 MG TOTAL) BY MOUTH DAILY. TAKE 1 HOUR BEFORE LASIX.   multivitamin capsule Take 1 capsule by mouth daily.   OLANZapine 10 MG tablet Commonly known as: ZYPREXA TAKE 1 TABLET (10 MG TOTAL) BY MOUTH AT BEDTIME.   ondansetron 8 MG tablet Commonly known as: Zofran Take 1 tablet (8 mg total) by mouth 2 (two) times daily as needed (Nausea or vomiting).   OxyCONTIN 30 MG 12 hr tablet Generic drug: oxyCODONE Take 1 tablet (30 mg total) by mouth every 8 (eight) hours.   polyethylene glycol 17 g packet Commonly known as: MIRALAX / GLYCOLAX Take 17 g by mouth daily as needed for mild constipation.   potassium chloride 10 MEQ tablet Commonly known as: KLOR-CON TAKE 1 TABLET BY MOUTH ONCE DAILY   predniSONE 50 MG tablet Commonly known as: DELTASONE TAKE 1 TABLET BY MOUTH 13 HOURS BEFORE CT SCAN, 1 TABLET 7 HOURS  BEFORE CT SCAN, AND 1 TABLET 1 HOUR BEFORE CT SCAN   prochlorperazine 10 MG tablet Commonly known as: COMPAZINE Take 1 tablet (10 mg total) by mouth every 6 (six) hours as needed (Nausea or vomiting).   promethazine 25 MG tablet Commonly known as: PHENERGAN TAKE 1 TABLET BY MOUTH EVERY 6 HOURS AS NEEDED FOR NAUSEA & VOMITING   senna-docusate 8.6-50 MG tablet Commonly known as: Senokot-S Take 1 tablet by mouth at  bedtime as needed for mild constipation.   temazepam 30 MG capsule Commonly known as: RESTORIL Take 1 capsule (30 mg total) by mouth at bedtime as needed for sleep.        Allergies:  Allergies  Allergen Reactions   Dextromethorphan-Guaifenesin Other (See Comments)    Irregular heartbeat    Doxycycline Anaphylaxis, Nausea And Vomiting, Rash and Other (See Comments)    "heart arrythmia" and "dyspepsia" (only oral doxycycline causes reaction)   Gadobutrol Hives and Other (See Comments)    Patient had MRI scan at [place]. Patient called one hour after he left imaging facility to report two "blisters" that came up on "back" of lip.     Gadolinium Derivatives Hives    Patient had MRI scan at [place]. Patient called one hour after he left imaging facility to report two "blisters" that came up on "back" of lip.    Guaifenesin Palpitations        Ibuprofen Hives and Itching   Lisinopril Other (See Comments)    Angioedema   Pantoprazole Itching   Tramadol Hives and Itching   Barium Rash    Developed redness around neck after drinking 1st bottle of Barocat; pt was given Benedryl by ED Mds to be "on the safe side" before drinking 2nd bottle   Chlorhexidine Hives    Past Medical History, Surgical history, Social history, and Family History were reviewed and updated.  Review of Systems: Review of Systems  Constitutional:  Positive for malaise/fatigue.  HENT: Negative.    Eyes: Negative.   Respiratory: Negative.    Cardiovascular: Negative.    Gastrointestinal:  Positive for abdominal pain, constipation, heartburn and nausea.  Genitourinary: Negative.   Musculoskeletal:  Positive for joint pain.  Skin: Negative.   Neurological: Negative.   Endo/Heme/Allergies: Negative.   Psychiatric/Behavioral: Negative.      Physical Exam:  weight is 267 lb (121.1 kg). His oral temperature is 98 F (36.7 C). His blood pressure is 126/98 (abnormal) and his pulse is 52 (abnormal). His respiration is 17 and oxygen saturation is 98%.   Wt Readings from Last 3 Encounters:  11/04/20 267 lb (121.1 kg)  10/14/20 270 lb 1.9 oz (122.5 kg)  10/14/20 270 lb 1.9 oz (122.5 kg)    Physical Exam Vitals reviewed.  HENT:     Head: Normocephalic and atraumatic.  Eyes:     Pupils: Pupils are equal, round, and reactive to light.  Cardiovascular:     Rate and Rhythm: Normal rate and regular rhythm.     Heart sounds: Normal heart sounds.  Pulmonary:     Effort: Pulmonary effort is normal.     Breath sounds: Normal breath sounds.  Abdominal:     General: Bowel sounds are normal.     Palpations: Abdomen is soft.  Musculoskeletal:        General: No tenderness or deformity. Normal range of motion.     Cervical back: Normal range of motion.  Lymphadenopathy:     Cervical: No cervical adenopathy.  Skin:    General: Skin is warm and dry.     Findings: No erythema or rash.  Neurological:     Mental Status: He is alert and oriented to person, place, and time.  Psychiatric:        Behavior: Behavior normal.        Thought Content: Thought content normal.        Judgment: Judgment normal.     Lab Results  Component Value  Date   WBC 4.1 11/04/2020   HGB 12.4 (L) 11/04/2020   HCT 36.3 (L) 11/04/2020   MCV 87.3 11/04/2020   PLT 212 11/04/2020   Lab Results  Component Value Date   FERRITIN 775 (H) 08/08/2020   IRON 67 08/08/2020   TIBC 214 08/08/2020   UIBC 147 08/08/2020   IRONPCTSAT 31 08/08/2020   Lab Results  Component Value Date    RETICCTPCT 3.9 (H) 08/08/2020   RBC 4.16 (L) 11/04/2020   No results found for: KPAFRELGTCHN, LAMBDASER, KAPLAMBRATIO No results found for: IGGSERUM, IGA, IGMSERUM No results found for: Odetta Pink, SPEI   Chemistry      Component Value Date/Time   NA 138 11/04/2020 0949   NA 141 02/06/2019 1210   K 4.1 11/04/2020 0949   CL 104 11/04/2020 0949   CO2 28 11/04/2020 0949   BUN 19 11/04/2020 0949   BUN 8 02/06/2019 1210   CREATININE 0.86 11/04/2020 0949      Component Value Date/Time   CALCIUM 9.4 11/04/2020 0949   ALKPHOS 92 11/04/2020 0949   AST 19 11/04/2020 0949   ALT 17 11/04/2020 0949   BILITOT 0.4 11/04/2020 0949       Impression and Plan: Brian [name] is a very pleasant 45 yo caucasian gentleman with an 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   Hopefully, the immunotherapy will keep his disease and check.  I does want him to be able to enjoy himself.  I hope and pray that he and his family will have a wonderful time down in [place].  I will plan for another follow-up in 3 weeks.   [name], MD 9/19/202212:36 PM

## 2020-11-04 NOTE — Patient Instructions (Signed)
Implanted Port Home Guide An implanted port is a device that is placed under the skin. It is usually placed in the chest. The device can be used to give IV medicine, to take blood, or for dialysis. You may have an implanted port if: You need IV medicine that would be irritating to the small veins in your hands or arms. You need IV medicines, such as antibiotics, for a long period of time. You need IV nutrition for a long period of time. You need dialysis. When you have a port, your health care provider can choose to use the port instead of veins in your arms for these procedures. You may have fewer limitations when using a port than you would if you used other types of long-term IVs, and you will likely be able to return to normal activities after your incision heals. An implanted port has two main parts: Reservoir. The reservoir is the part where a needle is inserted to give medicines or draw blood. The reservoir is round. After it is placed, it appears as a small, raised area under your skin. Catheter. The catheter is a thin, flexible tube that connects the reservoir to a vein. Medicine that is inserted into the reservoir goes into the catheter and then into the vein. How is my port accessed? To access your port: A numbing cream may be placed on the skin over the port site. Your health care provider will put on a mask and sterile gloves. The skin over your port will be cleaned carefully with a germ-killing soap and allowed to dry. Your health care provider will gently pinch the port and insert a needle into it. Your health care provider will check for a blood return to make sure the port is in the vein and is not clogged. If your port needs to remain accessed to get medicine continuously (constant infusion), your health care provider will place a clear bandage (dressing) over the needle site. The dressing and needle will need to be changed every week, or as told by your health care provider. What  is flushing? Flushing helps keep the port from getting clogged. Follow instructions from your health care provider about how and when to flush the port. Ports are usually flushed with saline solution or a medicine called heparin. The need for flushing will depend on how the port is used: If the port is only used from time to time to give medicines or draw blood, the port may need to be flushed: Before and after medicines have been given. Before and after blood has been drawn. As part of routine maintenance. Flushing may be recommended every 4-6 weeks. If a constant infusion is running, the port may not need to be flushed. Throw away any syringes in a disposal container that is meant for sharp items (sharps container). You can buy a sharps container from a pharmacy, or you can make one by using an empty hard plastic bottle with a cover. How long will my port stay implanted? The port can stay in for as long as your health care provider thinks it is needed. When it is time for the port to come out, a surgery will be done to remove it. The surgery will be similar to the procedure that was done to put the port in. Follow these instructions at home:  Flush your port as told by your health care provider. If you need an infusion over several days, follow instructions from your health care provider about how   to take care of your port site. Make sure you: Wash your hands with soap and water before you change your dressing. If soap and water are not available, use alcohol-based hand sanitizer. Change your dressing as told by your health care provider. Place any used dressings or infusion bags into a plastic bag. Throw that bag in the trash. Keep the dressing that covers the needle clean and dry. Do not get it wet. Do not use scissors or sharp objects near the tube. Keep the tube clamped, unless it is being used. Check your port site every day for signs of infection. Check for: Redness, swelling, or  pain. Fluid or blood. Pus or a bad smell. Protect the skin around the port site. Avoid wearing bra straps that rub or irritate the site. Protect the skin around your port from seat belts. Place a soft pad over your chest if needed. Bathe or shower as told by your health care provider. The site may get wet as long as you are not actively receiving an infusion. Return to your normal activities as told by your health care provider. Ask your health care provider what activities are safe for you. Carry a medical alert card or wear a medical alert bracelet at all times. This will let health care providers know that you have an implanted port in case of an emergency. Get help right away if: You have redness, swelling, or pain at the port site. You have fluid or blood coming from your port site. You have pus or a bad smell coming from the port site. You have a fever. Summary Implanted ports are usually placed in the chest for long-term IV access. Follow instructions from your health care provider about flushing the port and changing bandages (dressings). Take care of the area around your port by avoiding clothing that puts pressure on the area, and by watching for signs of infection. Protect the skin around your port from seat belts. Place a soft pad over your chest if needed. Get help right away if you have a fever or you have redness, swelling, pain, drainage, or a bad smell at the port site. This information is not intended to replace advice given to you by your health care provider. Make sure you discuss any questions you have with your health care provider. Document Revised: 04/24/2020 Document Reviewed: 06/19/2019 Elsevier Patient Education  2022 Elsevier Inc.  

## 2020-11-04 NOTE — Patient Instructions (Signed)
[facility]  Discharge Instructions: Thank you for choosing [facility] to provide your oncology and hematology care.   If you have a lab appointment with the [facility], please go directly to the [facility] and check in at the registration area.  Wear comfortable clothing and clothing appropriate for easy access to any Portacath or PICC line.   We strive to give you quality time with your provider. You may need to reschedule your appointment if you arrive late (15 or more minutes).  Arriving late affects you and other patients whose appointments are after yours.  Also, if you miss three or more appointments without notifying the office, you may be dismissed from the clinic at the provider's discretion.      For prescription refill requests, have your pharmacy contact our office and allow 72 hours for refills to be completed.    Today you received the following chemotherapy and/or immunotherapy agents Keytaruda.   To help prevent nausea and vomiting after your treatment, we encourage you to take your nausea medication as directed.  BELOW ARE SYMPTOMS THAT SHOULD BE REPORTED IMMEDIATELY: *FEVER GREATER THAN 100.4 F (38 C) OR HIGHER *CHILLS OR SWEATING *NAUSEA AND VOMITING THAT IS NOT CONTROLLED WITH YOUR NAUSEA MEDICATION *UNUSUAL SHORTNESS OF BREATH *UNUSUAL BRUISING OR BLEEDING *URINARY PROBLEMS (pain or burning when urinating, or frequent urination) *BOWEL PROBLEMS (unusual diarrhea, constipation, pain near the anus) TENDERNESS IN MOUTH AND THROAT WITH OR WITHOUT PRESENCE OF ULCERS (sore throat, sores in mouth, or a toothache) UNUSUAL RASH, SWELLING OR PAIN  UNUSUAL VAGINAL DISCHARGE OR ITCHING   Items with * indicate a potential emergency and should be followed up as soon as possible or go to the Emergency Department if any problems should occur.  Please show the CHEMOTHERAPY ALERT CARD or IMMUNOTHERAPY ALERT CARD at check-in to the  Emergency Department and triage nurse. Should you have questions after your visit or need to cancel or reschedule your appointment, please contact [facility]  [phone] and follow the prompts.  Office hours are 8:00 a.m. to 4:30 p.m. Monday - Friday. Please note that voicemails left after 4:00 p.m. may not be returned until the following business day.  We are closed weekends and major holidays. You have access to a nurse at all times for urgent questions. Please call the main number to the clinic [phone] and follow the prompts.  For any non-urgent questions, you may also contact your provider using MyChart. We now offer e-Visits for anyone 84 and older to request care online for non-urgent symptoms. For details visit [email or web address].   Also download the MyChart app! Go to the app store, search "MyChart", open the app, select [facility], and log in with your MyChart username and password.  Due to Covid, a mask is required upon entering the hospital/clinic. If you do not have a mask, one will be given to you upon arrival. For doctor visits, patients may have 1 support person aged 49 or older with them. For treatment visits, patients cannot have anyone with them due to current Covid guidelines and our immunocompromised population.

## 2020-11-04 NOTE — Patient Instructions (Signed)
[facility]  Discharge Instructions: Thank you for choosing [facility] to provide your oncology and hematology care.   If you have a lab appointment with the [facility], please go directly to the [facility] and check in at the registration area.  Wear comfortable clothing and clothing appropriate for easy access to any Portacath or PICC line.   We strive to give you quality time with your provider. You may need to reschedule your appointment if you arrive late (15 or more minutes).  Arriving late affects you and other patients whose appointments are after yours.  Also, if you miss three or more appointments without notifying the office, you may be dismissed from the clinic at the provider's discretion.      For prescription refill requests, have your pharmacy contact our office and allow 72 hours for refills to be completed.    Today you received the following chemotherapy and/or immunotherapy agents Keytruda      To help prevent nausea and vomiting after your treatment, we encourage you to take your nausea medication as directed.  BELOW ARE SYMPTOMS THAT SHOULD BE REPORTED IMMEDIATELY: *FEVER GREATER THAN 100.4 F (38 C) OR HIGHER *CHILLS OR SWEATING *NAUSEA AND VOMITING THAT IS NOT CONTROLLED WITH YOUR NAUSEA MEDICATION *UNUSUAL SHORTNESS OF BREATH *UNUSUAL BRUISING OR BLEEDING *URINARY PROBLEMS (pain or burning when urinating, or frequent urination) *BOWEL PROBLEMS (unusual diarrhea, constipation, pain near the anus) TENDERNESS IN MOUTH AND THROAT WITH OR WITHOUT PRESENCE OF ULCERS (sore throat, sores in mouth, or a toothache) UNUSUAL RASH, SWELLING OR PAIN  UNUSUAL VAGINAL DISCHARGE OR ITCHING   Items with * indicate a potential emergency and should be followed up as soon as possible or go to the Emergency Department if any problems should occur.  Please show the CHEMOTHERAPY ALERT CARD or IMMUNOTHERAPY ALERT CARD at check-in to the  Emergency Department and triage nurse. Should you have questions after your visit or need to cancel or reschedule your appointment, please contact [facility]  [phone] and follow the prompts.  Office hours are 8:00 a.m. to 4:30 p.m. Monday - Friday. Please note that voicemails left after 4:00 p.m. may not be returned until the following business day.  We are closed weekends and major holidays. You have access to a nurse at all times for urgent questions. Please call the main number to the clinic [phone] and follow the prompts.  For any non-urgent questions, you may also contact your provider using MyChart. We now offer e-Visits for anyone 63 and older to request care online for non-urgent symptoms. For details visit [email or web address].   Also download the MyChart app! Go to the app store, search "MyChart", open the app, select [facility], and log in with your MyChart username and password.  Due to Covid, a mask is required upon entering the hospital/clinic. If you do not have a mask, one will be given to you upon arrival. For doctor visits, patients may have 1 support person aged 67 or older with them. For treatment visits, patients cannot have anyone with them due to current Covid guidelines and our immunocompromised population.

## 2020-11-04 NOTE — Telephone Encounter (Signed)
Appts made per 11/04/20 los, pt to gain updated sch at [facility]

## 2020-11-06 ENCOUNTER — Other Ambulatory Visit (HOSPITAL_BASED_OUTPATIENT_CLINIC_OR_DEPARTMENT_OTHER): Payer: Self-pay

## 2020-11-07 ENCOUNTER — Other Ambulatory Visit (HOSPITAL_BASED_OUTPATIENT_CLINIC_OR_DEPARTMENT_OTHER): Payer: Self-pay

## 2020-11-28 ENCOUNTER — Encounter: Payer: Self-pay | Admitting: Family

## 2020-11-28 ENCOUNTER — Inpatient Hospital Stay: Payer: 59

## 2020-11-28 ENCOUNTER — Inpatient Hospital Stay (HOSPITAL_BASED_OUTPATIENT_CLINIC_OR_DEPARTMENT_OTHER): Payer: 59 | Admitting: Hematology & Oncology

## 2020-11-28 ENCOUNTER — Encounter: Payer: Self-pay | Admitting: Hematology & Oncology

## 2020-11-28 ENCOUNTER — Other Ambulatory Visit: Payer: Self-pay

## 2020-11-28 ENCOUNTER — Other Ambulatory Visit (HOSPITAL_BASED_OUTPATIENT_CLINIC_OR_DEPARTMENT_OTHER): Payer: Self-pay

## 2020-11-28 ENCOUNTER — Inpatient Hospital Stay: Payer: 59 | Attending: Hematology & Oncology

## 2020-11-28 ENCOUNTER — Other Ambulatory Visit: Payer: Self-pay | Admitting: *Deleted

## 2020-11-28 DIAGNOSIS — C787 Secondary malignant neoplasm of liver and intrahepatic bile duct: Secondary | ICD-10-CM | POA: Insufficient documentation

## 2020-11-28 DIAGNOSIS — Z5112 Encounter for antineoplastic immunotherapy: Secondary | ICD-10-CM | POA: Insufficient documentation

## 2020-11-28 DIAGNOSIS — Z79899 Other long term (current) drug therapy: Secondary | ICD-10-CM | POA: Insufficient documentation

## 2020-11-28 DIAGNOSIS — C73 Malignant neoplasm of thyroid gland: Secondary | ICD-10-CM

## 2020-11-28 DIAGNOSIS — C799 Secondary malignant neoplasm of unspecified site: Secondary | ICD-10-CM

## 2020-11-28 DIAGNOSIS — R1011 Right upper quadrant pain: Secondary | ICD-10-CM | POA: Insufficient documentation

## 2020-11-28 LAB — CBC WITH DIFFERENTIAL (CANCER CENTER ONLY)
Abs Immature Granulocytes: 0.02 10*3/uL (ref 0.00–0.07)
Basophils Absolute: 0 10*3/uL (ref 0.0–0.1)
Basophils Relative: 1 %
Eosinophils Absolute: 0.1 10*3/uL (ref 0.0–0.5)
Eosinophils Relative: 2 %
HCT: 35.6 % — ABNORMAL LOW (ref 39.0–52.0)
Hemoglobin: 12 g/dL — ABNORMAL LOW (ref 13.0–17.0)
Immature Granulocytes: 1 %
Lymphocytes Relative: 14 %
Lymphs Abs: 0.6 10*3/uL — ABNORMAL LOW (ref 0.7–4.0)
MCH: 29.3 pg (ref 26.0–34.0)
MCHC: 33.7 g/dL (ref 30.0–36.0)
MCV: 87 fL (ref 80.0–100.0)
Monocytes Absolute: 0.5 10*3/uL (ref 0.1–1.0)
Monocytes Relative: 11 %
Neutro Abs: 3.2 10*3/uL (ref 1.7–7.7)
Neutrophils Relative %: 71 %
Platelet Count: 182 10*3/uL (ref 150–400)
RBC: 4.09 MIL/uL — ABNORMAL LOW (ref 4.22–5.81)
RDW: 13.3 % (ref 11.5–15.5)
WBC Count: 4.4 10*3/uL (ref 4.0–10.5)
nRBC: 0 % (ref 0.0–0.2)

## 2020-11-28 LAB — CMP (CANCER CENTER ONLY)
ALT: 16 U/L (ref 0–44)
AST: 22 U/L (ref 15–41)
Albumin: 4 g/dL (ref 3.5–5.0)
Alkaline Phosphatase: 97 U/L (ref 38–126)
Anion gap: 7 (ref 5–15)
BUN: 15 mg/dL (ref 6–20)
CO2: 27 mmol/L (ref 22–32)
Calcium: 9.6 mg/dL (ref 8.9–10.3)
Chloride: 103 mmol/L (ref 98–111)
Creatinine: 0.82 mg/dL (ref 0.61–1.24)
GFR, Estimated: >60 mL/min (ref >60)
Glucose, Bld: 123 mg/dL — ABNORMAL HIGH (ref 70–99)
Potassium: 3.9 mmol/L (ref 3.5–5.1)
Sodium: 137 mmol/L (ref 135–145)
Total Bilirubin: 0.4 mg/dL (ref 0.3–1.2)
Total Protein: 6.3 g/dL — ABNORMAL LOW (ref 6.5–8.1)

## 2020-11-28 LAB — LACTATE DEHYDROGENASE: LDH: 224 U/L — ABNORMAL HIGH (ref 98–192)

## 2020-11-28 MED ORDER — OLANZAPINE 10 MG PO TABS
ORAL_TABLET | Freq: Every day | ORAL | 4 refills | Status: DC
  Filled 2020-11-28: qty 30, 30d supply, fill #0
  Filled 2021-01-06: qty 30, 30d supply, fill #1
  Filled 2021-03-20: qty 30, 30d supply, fill #2
  Filled 2021-08-18: qty 30, 30d supply, fill #3
  Filled 2021-09-12: qty 30, 30d supply, fill #4

## 2020-11-28 MED ORDER — HYDROMORPHONE HCL 4 MG/ML IJ SOLN
4.0000 mg | Freq: Once | INTRAMUSCULAR | Status: DC
Start: 2020-11-28 — End: 2020-11-28

## 2020-11-28 MED ORDER — SODIUM CHLORIDE 0.9 % IV SOLN
12.5000 mg | Freq: Once | INTRAVENOUS | Status: AC
  Administered 2020-11-28: 12.5 mg via INTRAVENOUS
  Filled 2020-11-28: qty 0.5

## 2020-11-28 MED ORDER — HYDROMORPHONE HCL 8 MG PO TABS
8.0000 mg | ORAL_TABLET | Freq: Four times a day (QID) | ORAL | 0 refills | Status: DC | PRN
  Filled 2020-11-28: qty 120, 30d supply, fill #0

## 2020-11-28 MED ORDER — SODIUM CHLORIDE 0.9 % IV SOLN: Freq: Once | INTRAVENOUS | Status: AC

## 2020-11-28 MED ORDER — METOCLOPRAMIDE HCL 10 MG PO TABS
10.0000 mg | ORAL_TABLET | Freq: Four times a day (QID) | ORAL | 6 refills | Status: DC | PRN
  Filled 2020-11-28: qty 30, 8d supply, fill #0
  Filled 2021-02-18: qty 30, 8d supply, fill #1
  Filled 2021-04-02: qty 30, 8d supply, fill #2
  Filled 2021-04-28 – 2021-06-25 (×2): qty 30, 8d supply, fill #3
  Filled 2021-07-23: qty 30, 8d supply, fill #4

## 2020-11-28 MED ORDER — SODIUM CHLORIDE 0.9% FLUSH
10.0000 mL | INTRAVENOUS | Status: DC | PRN
  Administered 2020-11-28: 10 mL

## 2020-11-28 MED ORDER — HYDROMORPHONE HCL 4 MG/ML IJ SOLN
4.0000 mg | INTRAMUSCULAR | Status: AC
Start: 2020-11-28 — End: 2020-11-28
  Administered 2020-11-28: 4 mg via INTRAVENOUS
  Filled 2020-11-28: qty 1

## 2020-11-28 MED ORDER — CYCLOBENZAPRINE HCL 10 MG PO TABS
10.0000 mg | ORAL_TABLET | Freq: Three times a day (TID) | ORAL | 1 refills | Status: DC | PRN
  Filled 2020-11-28: qty 90, 30d supply, fill #0
  Filled 2021-02-18: qty 90, 30d supply, fill #1

## 2020-11-28 MED ORDER — HEPARIN SOD (PORK) LOCK FLUSH 100 UNIT/ML IV SOLN
500.0000 [IU] | Freq: Once | INTRAVENOUS | Status: AC | PRN
  Administered 2020-11-28: 500 [IU]

## 2020-11-28 MED ORDER — SODIUM CHLORIDE 0.9 % IV SOLN
200.0000 mg | Freq: Once | INTRAVENOUS | Status: AC
  Administered 2020-11-28: 200 mg via INTRAVENOUS
  Filled 2020-11-28: qty 8

## 2020-11-28 MED ORDER — LORAZEPAM 1 MG PO TABS
1.0000 mg | ORAL_TABLET | Freq: Four times a day (QID) | ORAL | 0 refills | Status: DC | PRN
Start: 2020-11-28 — End: 2020-12-16
  Filled 2020-11-28: qty 30, 8d supply, fill #0
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
## 2020-11-28 NOTE — Patient Instructions (Signed)
Implanted Port Home Guide An implanted port is a device that is placed under the skin. It is usually placed in the chest. The device can be used to give IV medicine, to take blood, or for dialysis. You may have an implanted port if: You need IV medicine that would be irritating to the small veins in your hands or arms. You need IV medicines, such as antibiotics, for a long period of time. You need IV nutrition for a long period of time. You need dialysis. When you have a port, your health care provider can choose to use the port instead of veins in your arms for these procedures. You may have fewer limitations when using a port than you would if you used other types of long-term IVs, and you will likely be able to return to normal activities after your incision heals. An implanted port has two main parts: Reservoir. The reservoir is the part where a needle is inserted to give medicines or draw blood. The reservoir is round. After it is placed, it appears as a small, raised area under your skin. Catheter. The catheter is a thin, flexible tube that connects the reservoir to a vein. Medicine that is inserted into the reservoir goes into the catheter and then into the vein. How is my port accessed? To access your port: A numbing cream may be placed on the skin over the port site. Your health care provider will put on a mask and sterile gloves. The skin over your port will be cleaned carefully with a germ-killing soap and allowed to dry. Your health care provider will gently pinch the port and insert a needle into it. Your health care provider will check for a blood return to make sure the port is in the vein and is not clogged. If your port needs to remain accessed to get medicine continuously (constant infusion), your health care provider will place a clear bandage (dressing) over the needle site. The dressing and needle will need to be changed every week, or as told by your health care provider. What  is flushing? Flushing helps keep the port from getting clogged. Follow instructions from your health care provider about how and when to flush the port. Ports are usually flushed with saline solution or a medicine called heparin. The need for flushing will depend on how the port is used: If the port is only used from time to time to give medicines or draw blood, the port may need to be flushed: Before and after medicines have been given. Before and after blood has been drawn. As part of routine maintenance. Flushing may be recommended every 4-6 weeks. If a constant infusion is running, the port may not need to be flushed. Throw away any syringes in a disposal container that is meant for sharp items (sharps container). You can buy a sharps container from a pharmacy, or you can make one by using an empty hard plastic bottle with a cover. How long will my port stay implanted? The port can stay in for as long as your health care provider thinks it is needed. When it is time for the port to come out, a surgery will be done to remove it. The surgery will be similar to the procedure that was done to put the port in. Follow these instructions at home:  Flush your port as told by your health care provider. If you need an infusion over several days, follow instructions from your health care provider about how   to take care of your port site. Make sure you: Wash your hands with soap and water before you change your dressing. If soap and water are not available, use alcohol-based hand sanitizer. Change your dressing as told by your health care provider. Place any used dressings or infusion bags into a plastic bag. Throw that bag in the trash. Keep the dressing that covers the needle clean and dry. Do not get it wet. Do not use scissors or sharp objects near the tube. Keep the tube clamped, unless it is being used. Check your port site every day for signs of infection. Check for: Redness, swelling, or  pain. Fluid or blood. Pus or a bad smell. Protect the skin around the port site. Avoid wearing bra straps that rub or irritate the site. Protect the skin around your port from seat belts. Place a soft pad over your chest if needed. Bathe or shower as told by your health care provider. The site may get wet as long as you are not actively receiving an infusion. Return to your normal activities as told by your health care provider. Ask your health care provider what activities are safe for you. Carry a medical alert card or wear a medical alert bracelet at all times. This will let health care providers know that you have an implanted port in case of an emergency. Get help right away if: You have redness, swelling, or pain at the port site. You have fluid or blood coming from your port site. You have pus or a bad smell coming from the port site. You have a fever. Summary Implanted ports are usually placed in the chest for long-term IV access. Follow instructions from your health care provider about flushing the port and changing bandages (dressings). Take care of the area around your port by avoiding clothing that puts pressure on the area, and by watching for signs of infection. Protect the skin around your port from seat belts. Place a soft pad over your chest if needed. Get help right away if you have a fever or you have redness, swelling, pain, drainage, or a bad smell at the port site. This information is not intended to replace advice given to you by your health care provider. Make sure you discuss any questions you have with your health care provider. Document Revised: 04/24/2020 Document Reviewed: 06/19/2019 Elsevier Patient Education  2022 Elsevier Inc.  

## 2020-12-03 DIAGNOSIS — E890 Postprocedural hypothyroidism: Secondary | ICD-10-CM | POA: Diagnosis not present

## 2020-12-03 DIAGNOSIS — C73 Malignant neoplasm of thyroid gland: Secondary | ICD-10-CM | POA: Diagnosis not present

## 2020-12-16 ENCOUNTER — Other Ambulatory Visit: Payer: Self-pay | Admitting: Hematology & Oncology

## 2020-12-16 ENCOUNTER — Other Ambulatory Visit (HOSPITAL_BASED_OUTPATIENT_CLINIC_OR_DEPARTMENT_OTHER): Payer: Self-pay

## 2020-12-16 ENCOUNTER — Encounter: Payer: Self-pay | Admitting: Hematology & Oncology

## 2020-12-16 ENCOUNTER — Encounter: Payer: Self-pay | Admitting: Family

## 2020-12-16 DIAGNOSIS — C73 Malignant neoplasm of thyroid gland: Secondary | ICD-10-CM

## 2020-12-16 MED ORDER — LORAZEPAM 1 MG PO TABS
1.0000 mg | ORAL_TABLET | Freq: Four times a day (QID) | ORAL | 0 refills | Status: DC | PRN
Start: 2020-12-16 — End: 2021-01-22
  Filled 2020-12-16: qty 30, 8d supply, fill #0

## 2020-12-16 MED ORDER — OXYCONTIN 30 MG PO T12A
30.0000 mg | EXTENDED_RELEASE_TABLET | Freq: Three times a day (TID) | ORAL | 0 refills | Status: DC
  Filled 2020-12-16: qty 90, 30d supply, fill #0

## 2020-12-16 MED ORDER — TEMAZEPAM 30 MG PO CAPS
30.0000 mg | ORAL_CAPSULE | Freq: Every evening | ORAL | 0 refills | Status: DC | PRN
  Filled 2020-12-16: qty 30, 30d supply, fill #0

## 2020-12-18 ENCOUNTER — Other Ambulatory Visit (HOSPITAL_BASED_OUTPATIENT_CLINIC_OR_DEPARTMENT_OTHER): Payer: Self-pay

## 2020-12-19 ENCOUNTER — Encounter: Payer: Self-pay | Admitting: Hematology & Oncology

## 2020-12-19 ENCOUNTER — Inpatient Hospital Stay: Payer: 59

## 2020-12-19 ENCOUNTER — Inpatient Hospital Stay: Payer: 59 | Attending: Hematology & Oncology

## 2020-12-19 ENCOUNTER — Other Ambulatory Visit: Payer: Self-pay

## 2020-12-19 ENCOUNTER — Inpatient Hospital Stay (HOSPITAL_BASED_OUTPATIENT_CLINIC_OR_DEPARTMENT_OTHER): Payer: 59 | Admitting: Hematology & Oncology

## 2020-12-19 VITALS — BP 105/76 | HR 93 | Temp 98.0°F | Resp 16 | Ht 70.0 in | Wt 268.0 lb

## 2020-12-19 DIAGNOSIS — C73 Malignant neoplasm of thyroid gland: Secondary | ICD-10-CM

## 2020-12-19 DIAGNOSIS — C787 Secondary malignant neoplasm of liver and intrahepatic bile duct: Secondary | ICD-10-CM | POA: Diagnosis not present

## 2020-12-19 DIAGNOSIS — Z79899 Other long term (current) drug therapy: Secondary | ICD-10-CM | POA: Diagnosis not present

## 2020-12-19 DIAGNOSIS — C801 Malignant (primary) neoplasm, unspecified: Secondary | ICD-10-CM

## 2020-12-19 DIAGNOSIS — Z5112 Encounter for antineoplastic immunotherapy: Secondary | ICD-10-CM | POA: Diagnosis not present

## 2020-12-19 DIAGNOSIS — C799 Secondary malignant neoplasm of unspecified site: Secondary | ICD-10-CM

## 2020-12-19 LAB — CMP (CANCER CENTER ONLY)
ALT: 18 U/L (ref 0–44)
AST: 23 U/L (ref 15–41)
Albumin: 4 g/dL (ref 3.5–5.0)
Alkaline Phosphatase: 95 U/L (ref 38–126)
Anion gap: 7 (ref 5–15)
BUN: 16 mg/dL (ref 6–20)
CO2: 29 mmol/L (ref 22–32)
Calcium: 10.1 mg/dL (ref 8.9–10.3)
Chloride: 101 mmol/L (ref 98–111)
Creatinine: 0.82 mg/dL (ref 0.61–1.24)
GFR, Estimated: >60 mL/min (ref >60)
Glucose, Bld: 97 mg/dL (ref 70–99)
Potassium: 4.1 mmol/L (ref 3.5–5.1)
Sodium: 137 mmol/L (ref 135–145)
Total Bilirubin: 0.5 mg/dL (ref 0.3–1.2)
Total Protein: 6.6 g/dL (ref 6.5–8.1)

## 2020-12-19 LAB — LACTATE DEHYDROGENASE: LDH: 215 U/L — ABNORMAL HIGH (ref 98–192)

## 2020-12-19 LAB — CBC WITH DIFFERENTIAL (CANCER CENTER ONLY)
Abs Immature Granulocytes: 0.01 10*3/uL (ref 0.00–0.07)
Basophils Absolute: 0 10*3/uL (ref 0.0–0.1)
Basophils Relative: 1 %
Eosinophils Absolute: 0.1 10*3/uL (ref 0.0–0.5)
Eosinophils Relative: 2 %
HCT: 36 % — ABNORMAL LOW (ref 39.0–52.0)
Hemoglobin: 11.8 g/dL — ABNORMAL LOW (ref 13.0–17.0)
Immature Granulocytes: 0 %
Lymphocytes Relative: 16 %
Lymphs Abs: 0.7 10*3/uL (ref 0.7–4.0)
MCH: 28.8 pg (ref 26.0–34.0)
MCHC: 32.8 g/dL (ref 30.0–36.0)
MCV: 87.8 fL (ref 80.0–100.0)
Monocytes Absolute: 0.4 10*3/uL (ref 0.1–1.0)
Monocytes Relative: 10 %
Neutro Abs: 2.9 10*3/uL (ref 1.7–7.7)
Neutrophils Relative %: 71 %
Platelet Count: 274 10*3/uL (ref 150–400)
RBC: 4.1 MIL/uL — ABNORMAL LOW (ref 4.22–5.81)
RDW: 13.3 % (ref 11.5–15.5)
WBC Count: 4.1 10*3/uL (ref 4.0–10.5)
nRBC: 0 % (ref 0.0–0.2)

## 2020-12-19 MED ORDER — SODIUM CHLORIDE 0.9 % IV SOLN: Freq: Once | INTRAVENOUS | Status: AC

## 2020-12-19 MED ORDER — SODIUM CHLORIDE 0.9% FLUSH
10.0000 mL | INTRAVENOUS | Status: DC | PRN
  Administered 2020-12-19: 10 mL

## 2020-12-19 MED ORDER — SODIUM CHLORIDE 0.9 % IV SOLN
200.0000 mg | Freq: Once | INTRAVENOUS | Status: AC
  Administered 2020-12-19: 200 mg via INTRAVENOUS
  Filled 2020-12-19: qty 8

## 2020-12-19 MED ORDER — HEPARIN SOD (PORK) LOCK FLUSH 100 UNIT/ML IV SOLN
500.0000 [IU] | Freq: Once | INTRAVENOUS | Status: AC | PRN
  Administered 2020-12-19: 500 [IU]

## 2020-12-19 MED ORDER — SODIUM CHLORIDE 0.9% FLUSH: 10.0000 mL | INTRAVENOUS | Status: DC | PRN

## 2020-12-19 MED ORDER — HEPARIN SOD (PORK) LOCK FLUSH 100 UNIT/ML IV SOLN: 500.0000 [IU] | Freq: Once | INTRAVENOUS | Status: DC

## 2020-12-19 NOTE — Patient Instructions (Signed)
[facility]  Discharge Instructions: Thank you for choosing [facility] to provide your oncology and hematology care.   If you have a lab appointment with the [facility], please go directly to the [facility] and check in at the registration area.  Wear comfortable clothing and clothing appropriate for easy access to any Portacath or PICC line.   We strive to give you quality time with your provider. You may need to reschedule your appointment if you arrive late (15 or more minutes).  Arriving late affects you and other patients whose appointments are after yours.  Also, if you miss three or more appointments without notifying the office, you may be dismissed from the clinic at the provider's discretion.      For prescription refill requests, have your pharmacy contact our office and allow 72 hours for refills to be completed.    Today you received the following chemotherapy and/or immunotherapy agents Keytruda      To help prevent nausea and vomiting after your treatment, we encourage you to take your nausea medication as directed.  BELOW ARE SYMPTOMS THAT SHOULD BE REPORTED IMMEDIATELY: *FEVER GREATER THAN 100.4 F (38 C) OR HIGHER *CHILLS OR SWEATING *NAUSEA AND VOMITING THAT IS NOT CONTROLLED WITH YOUR NAUSEA MEDICATION *UNUSUAL SHORTNESS OF BREATH *UNUSUAL BRUISING OR BLEEDING *URINARY PROBLEMS (pain or burning when urinating, or frequent urination) *BOWEL PROBLEMS (unusual diarrhea, constipation, pain near the anus) TENDERNESS IN MOUTH AND THROAT WITH OR WITHOUT PRESENCE OF ULCERS (sore throat, sores in mouth, or a toothache) UNUSUAL RASH, SWELLING OR PAIN  UNUSUAL VAGINAL DISCHARGE OR ITCHING   Items with * indicate a potential emergency and should be followed up as soon as possible or go to the Emergency Department if any problems should occur.  Please show the CHEMOTHERAPY ALERT CARD or IMMUNOTHERAPY ALERT CARD at check-in to the  Emergency Department and triage nurse. Should you have questions after your visit or need to cancel or reschedule your appointment, please contact [facility]  [phone] and follow the prompts.  Office hours are 8:00 a.m. to 4:30 p.m. Monday - Friday. Please note that voicemails left after 4:00 p.m. may not be returned until the following business day.  We are closed weekends and major holidays. You have access to a nurse at all times for urgent questions. Please call the main number to the clinic [phone] and follow the prompts.  For any non-urgent questions, you may also contact your provider using MyChart. We now offer e-Visits for anyone 73 and older to request care online for non-urgent symptoms. For details visit [email or web address].   Also download the MyChart app! Go to the app store, search "MyChart", open the app, select [facility], and log in with your MyChart username and password.  Due to Covid, a mask is required upon entering the hospital/clinic. If you do not have a mask, one will be given to you upon arrival. For doctor visits, patients may have 1 support person aged 54 or older with them. For treatment visits, patients cannot have anyone with them due to current Covid guidelines and our immunocompromised population.

## 2020-12-19 NOTE — Progress Notes (Signed)
Hematology and Oncology Follow Up Visit  Brian [name] [id number] 1975-11-29 45 y.o. 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 + Bilateral pulmonary emboli without right heart strain  DVT left popliteal vein    Past Therapy: Cabometyx 40 mg po q day -- start on 04/17/2019 - d/c on 07/17/2019 for progression Lenvima 24 mg po q day -- on hold due to proteinuria Taxotere/CDDP -- s/p cycle 4 - started on 09/13/2019   Current Therapy:     Gavreto 400 mg po q day -- started on 05/17/2020  -- d/c on 06/16/2020 Adriamycin weekly 2 weeks on 1 week off - s/p cycle 8 --d/c on 04/16/2020 due to progression XRT for LEFT hip met Pembrolizumab 200 mg IV q 3 week -- s/p cycle #7 - start on 06/26/2020 Zometa 4 mg IV q 3 months - next dose on 01/2021 Eliquis 5 mg po BID   Interim History:  Brian [name] is here today for follow-up.  So far, he is doing pretty well.  Unfortunately, his wife is down the ER right now.  She is having some abdominal issues.  Hopefully everything will turn out okay for her.  He is feeling better.  His pain is under better control with respect to increasing the Dilaudid that we did.  He is eating better.  He has had no problems with nausea or vomiting.  He is going to the bathroom okay.  There is no constipation or diarrhea.  There is been no issues with fever.  He has had no bleeding.  He is on Eliquis.  He is doing well on the Eliquis.  There is no shortness of breath or chest pain.  He has had no rashes.  He is doing well with Marinol to help with his appetite.  Overall, his performance status is ECOG 1.    Medications:  Allergies as of 12/19/2020       Reactions   Dextromethorphan-guaifenesin Other (See Comments)   Irregular heartbeat   Doxycycline Anaphylaxis, Nausea And Vomiting, Rash, Other (See Comments)   "heart arrythmia" and "dyspepsia" (only oral doxycycline causes reaction)   Gadobutrol  Hives, Other (See Comments)   Patient had MRI scan at [place]. Patient called one hour after he left imaging facility to report two "blisters" that came up on "back" of lip.    Gadolinium Derivatives Hives   Patient had MRI scan at [place]. Patient called one hour after he left imaging facility to report two "blisters" that came up on "back" of lip.    Guaifenesin Palpitations      Ibuprofen Hives, Itching   Lisinopril Other (See Comments)   Angioedema   Pantoprazole Itching   Tramadol Hives, Itching   Barium Rash   Developed redness around neck after drinking 1st bottle of Barocat; pt was given Benedryl by ED Mds to be "on the safe side" before drinking 2nd bottle   Chlorhexidine Hives        Medication List        Accurate as of December 19, 2020 12:56 PM. If you have any questions, ask your nurse or doctor.          cyclobenzaprine 10 MG tablet Commonly known as: FLEXERIL Take 1 tablet (10 mg total) by mouth 3 (three) times daily as needed.   diphenhydrAMINE 25 mg capsule Commonly known as: BENADRYL TAKE 1 CAPSULE BY MOUTH 1 HOUR BEFORE CT SCAN  Eliquis 5 MG Tabs tablet Generic drug: apixaban TAKE 1 TABLET (5 MG TOTAL) BY MOUTH 2 (TWO) TIMES DAILY.   EPINEPHrine 0.3 mg/0.3 mL Soaj injection Commonly known as: EpiPen 2-Pak USE AS DIRECTED FOR LIFE THREATENING ALLERGIC REACTIONS   famotidine 40 MG tablet Commonly known as: PEPCID Take 1 tablet (40 mg total) by mouth 2 (two) times daily.   furosemide 80 MG tablet Commonly known as: LASIX TAKE 1 TABLET (80 MG TOTAL) BY MOUTH DAILY.   Fusion Plus Caps Take 1 capsule by mouth daily.   gabapentin 300 MG capsule Commonly known as: NEURONTIN TAKE 1 CAPSULE BY MOUTH THREE TIMES DAILY   HYDROmorphone 8 MG tablet Commonly known as: DILAUDID Take 1 tablet (8 mg total) by mouth every 6 (six) hours as needed for severe pain.   levothyroxine 200 MCG tablet Commonly known as: SYNTHROID Take one  tablet (200 mcg dose) by mouth daily. Total:  288 mcg/day.   levothyroxine 88 MCG tablet Commonly known as: SYNTHROID Take one tablet (88 mcg dose) by mouth daily. Total:  288 mcg/day.   LORazepam 1 MG tablet Commonly known as: ATIVAN Take 1 tablet (1 mg total) by mouth every 6 (six) hours as needed for anxiety.   losartan 25 MG tablet Commonly known as: COZAAR Take 25 mg by mouth as needed.   methylphenidate 10 MG tablet Commonly known as: RITALIN Take 1 tablet (10 mg total) by mouth 2 (two) times daily.   metoCLOPramide 10 MG tablet Commonly known as: REGLAN Take 1 tablet (10 mg total) by mouth every 6 (six) hours as needed for nausea or vomiting.   metolazone 5 MG tablet Commonly known as: ZAROXOLYN TAKE 1 TABLET (5 MG TOTAL) BY MOUTH DAILY. TAKE 1 HOUR BEFORE LASIX.   multivitamin capsule Take 1 capsule by mouth daily.   OLANZapine 10 MG tablet Commonly known as: ZYPREXA TAKE 1 TABLET (10 MG TOTAL) BY MOUTH AT BEDTIME.   ondansetron 8 MG tablet Commonly known as: Zofran Take 1 tablet (8 mg total) by mouth 2 (two) times daily as needed (Nausea or vomiting).   OxyCONTIN 30 MG 12 hr tablet Generic drug: oxyCODONE Take 1 tablet (30 mg total) by mouth every 8 (eight) hours.   polyethylene glycol 17 g packet Commonly known as: MIRALAX / GLYCOLAX Take 17 g by mouth daily as needed for mild constipation.   potassium chloride 10 MEQ tablet Commonly known as: KLOR-CON TAKE 1 TABLET BY MOUTH ONCE DAILY   predniSONE 50 MG tablet Commonly known as: DELTASONE TAKE 1 TABLET BY MOUTH 13 HOURS BEFORE CT SCAN, 1 TABLET 7 HOURS BEFORE CT SCAN, AND 1 TABLET 1 HOUR BEFORE CT SCAN   prochlorperazine 10 MG tablet Commonly known as: COMPAZINE Take 1 tablet (10 mg total) by mouth every 6 (six) hours as needed (Nausea or vomiting).   promethazine 25 MG tablet Commonly known as: PHENERGAN TAKE 1 TABLET BY MOUTH EVERY 6 HOURS AS NEEDED FOR NAUSEA & VOMITING   senna-docusate  8.6-50 MG tablet Commonly known as: Senokot-S Take 1 tablet by mouth at bedtime as needed for mild constipation.   temazepam 30 MG capsule Commonly known as: RESTORIL Take 1 capsule (30 mg total) by mouth at bedtime as needed for sleep.        Allergies:  Allergies  Allergen Reactions   Dextromethorphan-Guaifenesin Other (See Comments)    Irregular heartbeat    Doxycycline Anaphylaxis, Nausea And Vomiting, Rash and Other (See Comments)    "heart arrythmia" and "  dyspepsia" (only oral doxycycline causes reaction)   Gadobutrol Hives and Other (See Comments)    Patient had MRI scan at [place]. Patient called one hour after he left imaging facility to report two "blisters" that came up on "back" of lip.     Gadolinium Derivatives Hives    Patient had MRI scan at [place]. Patient called one hour after he left imaging facility to report two "blisters" that came up on "back" of lip.    Guaifenesin Palpitations        Ibuprofen Hives and Itching   Lisinopril Other (See Comments)    Angioedema   Pantoprazole Itching   Tramadol Hives and Itching   Barium Rash    Developed redness around neck after drinking 1st bottle of Barocat; pt was given Benedryl by ED Mds to be "on the safe side" before drinking 2nd bottle   Chlorhexidine Hives    Past Medical History, Surgical history, Social history, and Family History were reviewed and updated.  Review of Systems: Review of Systems  Constitutional:  Positive for malaise/fatigue.  HENT: Negative.    Eyes: Negative.   Respiratory: Negative.    Cardiovascular: Negative.   Gastrointestinal:  Positive for abdominal pain, constipation, heartburn and nausea.  Genitourinary: Negative.   Musculoskeletal:  Positive for joint pain.  Skin: Negative.   Neurological: Negative.   Endo/Heme/Allergies: Negative.   Psychiatric/Behavioral: Negative.      Physical Exam:  height is 5' 10"  (1.778 m) and weight is 268 lb (121.6  kg). His oral temperature is 98 F (36.7 C). His blood pressure is 105/76 and his pulse is 93. His respiration is 16 and oxygen saturation is 99%.   Wt Readings from Last 3 Encounters:  12/19/20 268 lb (121.6 kg)  11/28/20 270 lb (122.5 kg)  11/04/20 267 lb (121.1 kg)    Physical Exam Vitals reviewed.  HENT:     Head: Normocephalic and atraumatic.  Eyes:     Pupils: Pupils are equal, round, and reactive to light.  Cardiovascular:     Rate and Rhythm: Normal rate and regular rhythm.     Heart sounds: Normal heart sounds.  Pulmonary:     Effort: Pulmonary effort is normal.     Breath sounds: Normal breath sounds.  Abdominal:     General: Bowel sounds are normal.     Palpations: Abdomen is soft.  Musculoskeletal:        General: No tenderness or deformity. Normal range of motion.     Cervical back: Normal range of motion.  Lymphadenopathy:     Cervical: No cervical adenopathy.  Skin:    General: Skin is warm and dry.     Findings: No erythema or rash.  Neurological:     Mental Status: He is alert and oriented to person, place, and time.  Psychiatric:        Behavior: Behavior normal.        Thought Content: Thought content normal.        Judgment: Judgment normal.     Lab Results  Component Value Date   WBC 4.1 12/19/2020   HGB 11.8 (L) 12/19/2020   HCT 36.0 (L) 12/19/2020   MCV 87.8 12/19/2020   PLT 274 12/19/2020   Lab Results  Component Value Date   FERRITIN 775 (H) 08/08/2020   IRON 67 08/08/2020   TIBC 214 08/08/2020   UIBC 147 08/08/2020   IRONPCTSAT 31 08/08/2020   Lab Results  Component Value Date  RETICCTPCT 3.9 (H) 08/08/2020   RBC 4.10 (L) 12/19/2020   No results found for: KPAFRELGTCHN, LAMBDASER, KAPLAMBRATIO No results found for: IGGSERUM, IGA, IGMSERUM No results found for: Odetta Pink, SPEI   Chemistry      Component Value Date/Time   NA 137 12/19/2020 1125   NA 141 02/06/2019  1210   K 4.1 12/19/2020 1125   CL 101 12/19/2020 1125   CO2 29 12/19/2020 1125   BUN 16 12/19/2020 1125   BUN 8 02/06/2019 1210   CREATININE 0.82 12/19/2020 1125      Component Value Date/Time   CALCIUM 10.1 12/19/2020 1125   ALKPHOS 95 12/19/2020 1125   AST 23 12/19/2020 1125   ALT 18 12/19/2020 1125   BILITOT 0.5 12/19/2020 1125       Impression and Plan: Brian [name] is a very pleasant 45 yo caucasian gentleman with an 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   He is on immunotherapy.  It is hard to say how well the immunotherapy is working right now.  I think we will go ahead and repeat his scans after this cycle.  Hopefully, we will see everything improving or maybe even stable.  We will plan for his eighth cycle today.  Will get his scans set up in about 2 or 3 weeks.  Then we will get him back after Thanksgiving.   [name], MD 11/3/202212:56 PM

## 2020-12-20 LAB — THYROID PANEL WITH TSH
Free Thyroxine Index: 2.1 (ref 1.2–4.9)
T3 Uptake Ratio: 24 % (ref 24–39)
T4, Total: 8.8 ug/dL (ref 4.5–12.0)
TSH: 0.205 u[IU]/mL — ABNORMAL LOW (ref 0.450–4.500)

## 2021-01-01 ENCOUNTER — Other Ambulatory Visit: Payer: Self-pay

## 2021-01-01 ENCOUNTER — Encounter (HOSPITAL_BASED_OUTPATIENT_CLINIC_OR_DEPARTMENT_OTHER): Payer: Self-pay | Admitting: *Deleted

## 2021-01-01 ENCOUNTER — Encounter: Payer: Self-pay | Admitting: Family

## 2021-01-01 ENCOUNTER — Emergency Department (HOSPITAL_BASED_OUTPATIENT_CLINIC_OR_DEPARTMENT_OTHER)
Admission: EM | Admit: 2021-01-01 | Discharge: 2021-01-01 | Disposition: A | Discharge status: Home / Self Care | Payer: 59 | Attending: Emergency Medicine | Admitting: Emergency Medicine

## 2021-01-01 ENCOUNTER — Telehealth: Payer: Self-pay | Admitting: *Deleted

## 2021-01-01 ENCOUNTER — Emergency Department (HOSPITAL_BASED_OUTPATIENT_CLINIC_OR_DEPARTMENT_OTHER): Payer: 59

## 2021-01-01 ENCOUNTER — Encounter: Payer: Self-pay | Admitting: Hematology & Oncology

## 2021-01-01 DIAGNOSIS — Z7901 Long term (current) use of anticoagulants: Secondary | ICD-10-CM | POA: Insufficient documentation

## 2021-01-01 DIAGNOSIS — G893 Neoplasm related pain (acute) (chronic): Secondary | ICD-10-CM | POA: Insufficient documentation

## 2021-01-01 DIAGNOSIS — M25552 Pain in left hip: Secondary | ICD-10-CM | POA: Insufficient documentation

## 2021-01-01 DIAGNOSIS — I5032 Chronic diastolic (congestive) heart failure: Secondary | ICD-10-CM | POA: Insufficient documentation

## 2021-01-01 DIAGNOSIS — I110 Hypertensive heart disease with heart failure: Secondary | ICD-10-CM | POA: Insufficient documentation

## 2021-01-01 DIAGNOSIS — M25511 Pain in right shoulder: Secondary | ICD-10-CM | POA: Diagnosis not present

## 2021-01-01 DIAGNOSIS — R1011 Right upper quadrant pain: Secondary | ICD-10-CM | POA: Insufficient documentation

## 2021-01-01 DIAGNOSIS — Z79899 Other long term (current) drug therapy: Secondary | ICD-10-CM | POA: Insufficient documentation

## 2021-01-01 DIAGNOSIS — G8929 Other chronic pain: Secondary | ICD-10-CM

## 2021-01-01 LAB — CBC WITH DIFFERENTIAL/PLATELET
Abs Immature Granulocytes: 0.02 10*3/uL (ref 0.00–0.07)
Basophils Absolute: 0 10*3/uL (ref 0.0–0.1)
Basophils Relative: 1 %
Eosinophils Absolute: 0.1 10*3/uL (ref 0.0–0.5)
Eosinophils Relative: 1 %
HCT: 35.8 % — ABNORMAL LOW (ref 39.0–52.0)
Hemoglobin: 11.9 g/dL — ABNORMAL LOW (ref 13.0–17.0)
Immature Granulocytes: 0 %
Lymphocytes Relative: 17 %
Lymphs Abs: 1 10*3/uL (ref 0.7–4.0)
MCH: 28.7 pg (ref 26.0–34.0)
MCHC: 33.2 g/dL (ref 30.0–36.0)
MCV: 86.3 fL (ref 80.0–100.0)
Monocytes Absolute: 0.5 10*3/uL (ref 0.1–1.0)
Monocytes Relative: 9 %
Neutro Abs: 4 10*3/uL (ref 1.7–7.7)
Neutrophils Relative %: 72 %
Platelets: 241 10*3/uL (ref 150–400)
RBC: 4.15 MIL/uL — ABNORMAL LOW (ref 4.22–5.81)
RDW: 13.8 % (ref 11.5–15.5)
WBC: 5.5 10*3/uL (ref 4.0–10.5)
nRBC: 0 % (ref 0.0–0.2)

## 2021-01-01 LAB — TROPONIN I (HIGH SENSITIVITY)
Troponin I (High Sensitivity): 6 ng/L (ref <18)
Troponin I (High Sensitivity): 6 ng/L (ref <18)

## 2021-01-01 LAB — COMPREHENSIVE METABOLIC PANEL
ALT: 28 U/L (ref 0–44)
AST: 29 U/L (ref 15–41)
Albumin: 3.8 g/dL (ref 3.5–5.0)
Alkaline Phosphatase: 107 U/L (ref 38–126)
Anion gap: 7 (ref 5–15)
BUN: 16 mg/dL (ref 6–20)
CO2: 27 mmol/L (ref 22–32)
Calcium: 8.8 mg/dL — ABNORMAL LOW (ref 8.9–10.3)
Chloride: 100 mmol/L (ref 98–111)
Creatinine, Ser: 0.78 mg/dL (ref 0.61–1.24)
GFR, Estimated: >60 mL/min (ref >60)
Glucose, Bld: 84 mg/dL (ref 70–99)
Potassium: 3.6 mmol/L (ref 3.5–5.1)
Sodium: 134 mmol/L — ABNORMAL LOW (ref 135–145)
Total Bilirubin: 0.4 mg/dL (ref 0.3–1.2)
Total Protein: 6.7 g/dL (ref 6.5–8.1)

## 2021-01-01 LAB — LIPASE, BLOOD: Lipase: 23 U/L (ref 11–51)

## 2021-01-01 MED ORDER — SODIUM CHLORIDE 0.9 % IV SOLN: Freq: Once | INTRAVENOUS | Status: AC

## 2021-01-01 MED ORDER — HYDROMORPHONE HCL 1 MG/ML IJ SOLN
1.0000 mg | Freq: Once | INTRAMUSCULAR | Status: AC
  Administered 2021-01-01: 1 mg via INTRAVENOUS
  Filled 2021-01-01: qty 1

## 2021-01-01 MED ORDER — METOCLOPRAMIDE HCL 5 MG/ML IJ SOLN
10.0000 mg | Freq: Once | INTRAMUSCULAR | Status: AC
  Administered 2021-01-01: 10 mg via INTRAVENOUS
  Filled 2021-01-01: qty 2

## 2021-01-01 MED ORDER — PREDNISONE 10 MG PO TABS: ORAL_TABLET | ORAL | 0 refills | Status: DC

## 2021-01-01 MED ORDER — HYDROMORPHONE HCL 1 MG/ML IJ SOLN
2.0000 mg | Freq: Once | INTRAMUSCULAR | Status: AC
  Administered 2021-01-01: 2 mg via INTRAVENOUS
  Filled 2021-01-01: qty 2

## 2021-01-01 MED ORDER — HEPARIN SOD (PORK) LOCK FLUSH 100 UNIT/ML IV SOLN
500.0000 [IU] | Freq: Once | INTRAVENOUS | Status: AC
  Administered 2021-01-01: 500 [IU]
  Filled 2021-01-01: qty 5

## 2021-01-01 NOTE — ED Notes (Signed)
ED Provider at bedside. 

## 2021-01-01 NOTE — Discharge Instructions (Signed)
I prescribed you 3 doses of 50 mg prednisone for your upcoming MRI with contrast.  Please only take this as prescribed.  If you develop any new or worsening symptoms please come back to the emergency department.

## 2021-01-01 NOTE — ED Triage Notes (Addendum)
Pt c/o right shoulder and left hip pain x 5 days with increased pain today , hx CA  mets

## 2021-01-01 NOTE — ED Provider Notes (Signed)
[name] Provider Note   CSN: [id number] Arrival date & time: 01/01/21  1236     History Chief Complaint  Patient presents with   Pain    Brian [name] is a 45 y.o. male.  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  Patient states that his symptoms are chronic but have been worsening over the past five days and not improved with his home pain regimen.  States has been compliant with his Eliquis for his DVT/PE.  States that over the past 24 hours he has had worsening pain in the right upper quadrant, right shoulder, and left hip.  States that he has known bony mets to the left hip as well and has received radiation in the region.  States his right shoulder pain is circumferential and starts along the right scapula and moves around the right shoulder and right side of the chest.  Denies any shortness of breath.  No vomiting or diarrhea.  No urinary complaints.  Oncologist: Dr. Burney Gauze     Past Medical History:  Diagnosis Date   Chronic diastolic CHF (congestive heart failure) ([place]) 04/11/2020   DVT, lower extremity, distal, acute, left ([place]) 01/14/2020   Essential hypertension 09/09/2018   Goals of care, counseling/discussion 01/14/2020   Kidney stone    Primary cancer of thyroid with metastasis to other site ([place]) 05/04/2018   Rickettsia infection     Patient Active Problem List   Diagnosis Date Noted   Other long term (current) drug therapy 06/13/2020   Intractable pain 04/12/2020   Intractable abdominal pain 04/11/2020   Chronic diastolic CHF (congestive heart failure) ([place]) 04/11/2020   IDA (iron deficiency anemia) 04/09/2020   Obesity, Class III, BMI 40-49.9 (morbid obesity) ([place]) 02/09/2020    Hypothyroidism 02/09/2020   AKI (acute kidney injury) ([place]) 02/09/2020   Sepsis ([place]) 02/08/2020   Goals of care, counseling/discussion 01/14/2020   DVT, lower extremity, distal, acute, left ([place]) 01/14/2020   Pulmonary embolism without acute cor pulmonale ([place]) 01/13/2020   Liver metastases ([place]) 08/13/2019   Nephrotic range proteinuria 05/08/2019   Essential hypertension 09/09/2018   Metastasis from thyroid cancer ([place]) 07/29/2018   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   Adenocarcinoma ([place]) 05/17/2018   Primary cancer of thyroid with metastasis to other site ([place]) 05/04/2018   Extrasystole 04/26/2018   Dyspnea on exertion 04/26/2018   Atypical chest pain 04/26/2018   Ureteral stone with hydronephrosis 02/15/2018   Right thyroid nodule 04/09/2017   Bilateral knee pain 07/01/2016   Left foot pain 02/06/2016   Gastroesophageal reflux disease without esophagitis 05/08/2014   Thrombocytopenia, unspecified ([place]) 06/13/2013   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   Low back pain 05/12/2012   Allergic rhinitis 06/03/2009    Past Surgical History:  Procedure Laterality Date   BIOPSY  04/05/2018   Procedure: BIOPSY;  Surgeon: Ronald Lobo, MD;  Location: [facility];  Service: Endoscopy;;   CHOLECYSTECTOMY     ESOPHAGOGASTRODUODENOSCOPY (EGD) WITH PROPOFOL N/A 04/05/2018   Procedure: ESOPHAGOGASTRODUODENOSCOPY (EGD) WITH PROPOFOL;  Surgeon: Ronald Lobo, MD;  Location: [facility];  Service: Endoscopy;  Laterality: N/A;   IR IMAGING GUIDED PORT INSERTION  09/13/2019   IR RADIOLOGIST EVAL & MGMT  10/11/2018       Family History  Problem Relation Age of Onset   Diabetes Mother    Heart disease Mother    Heart failure Mother    Heart attack Mother    Hyperlipidemia Mother    Hypertension Mother    Hyperlipidemia Father    Allergic rhinitis Father    Rashes / Skin problems Father    Sudden death Neg Hx    Thyroid disease Neg Hx     Social History   Tobacco Use    Smoking status: Never   Smokeless tobacco: Never  Vaping Use   Vaping Use: Never used  Substance Use Topics   Alcohol use: No   Drug use: No    Home Medications Prior to Admission medications   Medication Sig Start Date End Date Taking? Authorizing Provider  HYDROmorphone (DILAUDID) 8 MG tablet Take 1 tablet (8 mg total) by mouth every 6 (six) hours as needed for severe pain. 11/28/20  Yes [name], MD  oxyCODONE (OXYCONTIN) 30 MG 12 hr tablet Take 1 tablet (30 mg total) by mouth every 8 (eight) hours. 12/16/20  Yes Ennever, Rudell Cobb, MD  predniSONE (DELTASONE) 10 MG tablet Please take 3 doses of 50 mg oral prednisone 13 hours, 7 hours, and 1 hour prior to contrast administration. 01/01/21  Yes Rayna Sexton, PA-C  apixaban (ELIQUIS) 5 MG TABS tablet TAKE 1 TABLET (5 MG TOTAL) BY MOUTH 2 (TWO) TIMES DAILY. 11/04/20 11/04/21  [name], MD  cyclobenzaprine (FLEXERIL) 10 MG tablet Take 1 tablet (10 mg total) by mouth 3 (three) times daily as needed. 11/28/20   [name], MD  diphenhydrAMINE (BENADRYL) 25 mg capsule TAKE 1 CAPSULE BY MOUTH 1 HOUR BEFORE CT SCAN Patient not taking: No sig reported 09/04/20 09/04/21  [name], MD  EPINEPHrine (EPIPEN 2-PAK) 0.3 mg/0.3 mL IJ SOAJ injection USE AS DIRECTED FOR LIFE THREATENING ALLERGIC REACTIONS Patient not taking: No sig reported 09/04/20   [name], MD  famotidine (PEPCID) 40 MG tablet Take 1 tablet (40 mg total) by mouth 2 (two) times daily. 04/22/20   [name], MD  Iron-FA-B Cmp-C-Biot-Probiotic (FUSION PLUS) CAPS Take 1 capsule by mouth daily. 05/20/20   Celso Amy, NP  levothyroxine (SYNTHROID) 200 MCG tablet Take one tablet (200 mcg dose) by mouth daily. Total:  288 mcg/day. 06/03/20     levothyroxine (SYNTHROID) 88 MCG tablet Take one tablet (88 mcg dose) by mouth daily. Total:  288 mcg/day. 06/03/20     LORazepam (ATIVAN) 1 MG tablet Take 1 tablet (1 mg total) by mouth every 6 (six) hours as needed  for anxiety. 12/16/20   [name], MD  methylphenidate (RITALIN) 10 MG tablet Take 1 tablet (10 mg total) by mouth 2 (two) times daily. 11/04/20   [name], MD  metoCLOPramide (REGLAN) 10 MG tablet Take 1 tablet (10 mg total) by mouth every 6 (six) hours as needed for nausea or vomiting. 11/28/20   [name], MD  Multiple Vitamin (MULTIVITAMIN) capsule Take 1 capsule by mouth daily.    [provider]  OLANZapine (ZYPREXA) 10 MG tablet TAKE 1 TABLET (10 MG TOTAL) BY MOUTH AT BEDTIME. 11/28/20 11/28/21  [name], MD  ondansetron (ZOFRAN) 8 MG tablet Take 1 tablet (8 mg total) by mouth 2 (two) times daily as needed (Nausea or vomiting). 06/26/20   [name], MD  polyethylene glycol (MIRALAX / GLYCOLAX) 17 g packet Take 17 g by mouth daily as needed for  mild constipation. 04/15/20   Dessa Phi, DO  predniSONE (DELTASONE) 50 MG tablet TAKE 1 TABLET BY MOUTH 13 HOURS BEFORE CT SCAN, 1 TABLET 7 HOURS BEFORE CT SCAN, AND 1 TABLET 1 HOUR BEFORE CT SCAN Patient not taking: No sig reported 09/04/20 09/04/21  [name], MD  prochlorperazine (COMPAZINE) 10 MG tablet Take 1 tablet (10 mg total) by mouth every 6 (six) hours as needed (Nausea or vomiting). Patient not taking: No sig reported 06/26/20   [name], MD  promethazine (PHENERGAN) 25 MG tablet TAKE 1 TABLET BY MOUTH EVERY 6 HOURS AS NEEDED FOR NAUSEA & VOMITING 06/17/20 06/17/21  [name], MD  senna-docusate (SENOKOT-S) 8.6-50 MG tablet Take 1 tablet by mouth at bedtime as needed for mild constipation. Patient not taking: No sig reported 04/15/20   Dessa Phi, DO  temazepam (RESTORIL) 30 MG capsule Take 1 capsule (30 mg total) by mouth at bedtime as needed for sleep. 12/16/20   [name], MD    Allergies    Dextromethorphan-guaifenesin, Doxycycline, Gadobutrol, Gadolinium derivatives, Guaifenesin, Ibuprofen, Lisinopril, Pantoprazole, Tramadol, Barium, and Chlorhexidine  Review of  Systems   Review of Systems  All other systems reviewed and are negative. Ten systems reviewed and are negative for acute change, except as noted in the HPI.   Physical Exam Updated Vital Signs BP (!) 139/99   Pulse 99   Temp 98.5 F (36.9 C) (Oral)   Resp 17   Ht 5\' 10"  (1.778 m)   Wt 121.6 kg   SpO2 96%   BMI 38.45 kg/m   Physical Exam Vitals and nursing note reviewed.  Constitutional:      General: He is not in acute distress.    Appearance: Normal appearance. He is not ill-appearing, toxic-appearing or diaphoretic.  HENT:     Head: Normocephalic and atraumatic.     Right Ear: External ear normal.     Left Ear: External ear normal.     Nose: Nose normal.     Mouth/Throat:     Mouth: Mucous membranes are moist.     Pharynx: Oropharynx is clear. No oropharyngeal exudate or posterior oropharyngeal erythema.  Eyes:     General: No scleral icterus.       Right eye: No discharge.        Left eye: No discharge.     Extraocular Movements: Extraocular movements intact.     Conjunctiva/sclera: Conjunctivae normal.  Cardiovascular:     Rate and Rhythm: Normal rate and regular rhythm.     Pulses: Normal pulses.     Heart sounds: Normal heart sounds. No murmur heard.   No friction rub. No gallop.  Pulmonary:     Effort: Pulmonary effort is normal. No respiratory distress.     Breath sounds: Normal breath sounds. No stridor. No wheezing, rhonchi or rales.  Abdominal:     General: Abdomen is flat.     Palpations: Abdomen is soft.     Tenderness: There is abdominal tenderness.     Comments: Protuberant abdomen that is soft.  Moderate tenderness noted in the right upper quadrant.  Musculoskeletal:        General: Tenderness present. Normal range of motion.     Cervical back: Normal range of motion and neck supple. No tenderness.     Comments: Mild tenderness noted along the left lateral hip.  Skin:    General: Skin is warm and dry.  Neurological:     General: No focal  deficit present.     Mental Status: He is alert and oriented to person, place, and time.  Psychiatric:        Mood and Affect: Mood normal.        Behavior: Behavior normal.   ED Results / Procedures / Treatments   Labs (all labs ordered are listed, but only abnormal results are displayed) Labs Reviewed  COMPREHENSIVE METABOLIC PANEL - Abnormal; Notable for the following components:      Result Value   Sodium 134 (*)    Calcium 8.8 (*)    All other components within normal limits  CBC WITH DIFFERENTIAL/PLATELET - Abnormal; Notable for the following components:   RBC 4.15 (*)    Hemoglobin 11.9 (*)    HCT 35.8 (*)    All other components within normal limits  LIPASE, BLOOD  TROPONIN I (HIGH SENSITIVITY)  TROPONIN I (HIGH SENSITIVITY)    EKG None  Radiology DG Chest Portable 1 View  Result Date: 01/01/2021 CLINICAL DATA:  Right shoulder and left hip pain. EXAM: PORTABLE CHEST 1 VIEW COMPARISON:  February 08, 2020 FINDINGS: There is stable left-sided venous Port-A-Cath positioning. Mild, stable linear atelectasis and/or scarring is noted within the left lung base. There is no evidence of a pleural effusion or pneumothorax. The heart size and mediastinal contours are within normal limits. The visualized skeletal structures are unremarkable. IMPRESSION: Stable exam without acute cardiopulmonary disease. Electronically Signed   By: Virgina Norfolk M.D.   On: 01/01/2021 14:59    Procedures Procedures   Medications Ordered in ED Medications  HYDROmorphone (DILAUDID) injection 1 mg (1 mg Intravenous Given 01/01/21 1526)  metoCLOPramide (REGLAN) injection 10 mg (10 mg Intravenous Given 01/01/21 1526)  0.9 %  sodium chloride infusion (0 mLs Intravenous Stopped 01/01/21 1847)  HYDROmorphone (DILAUDID) injection 1 mg (1 mg Intravenous Given 01/01/21 1606)  HYDROmorphone (DILAUDID) injection 1 mg (1 mg Intravenous Given 01/01/21 1648)  HYDROmorphone (DILAUDID) injection 2 mg (2 mg  Intravenous Given 01/01/21 1748)  heparin lock flush 100 unit/mL (500 Units Intracatheter Given 01/01/21 1844)   ED Course  I have reviewed the triage vital signs and the nursing notes.  Pertinent labs & imaging results that were available during my care of the patient were reviewed by me and considered in my medical decision making (see chart for details).    MDM Rules/Calculators/A&P                          Pt is a 45 y.o. male with metastatic thyroid cancer presents to the emergency department due to worsening chronic pain.  Patient states that his home regimen has not been providing adequate analgesia and over the past few days his pain is continued to worsen.  Labs: CBC with RBCs of 4.15, hemoglobin of 11.9, hematocrit of 35.8. CMP with sodium of 134 and a calcium of 8.8. Lipase of 23. Troponin of 6 with a repeat of 6.  Imaging: Chest x-ray shows a stable exam without acute cardiopulmonary disease.  I, Rayna Sexton, PA-C, personally reviewed and evaluated these images and lab results as part of my medical decision-making.  Patient with metastatic thyroid cancer with known mets to the lungs, liver, and left hip.  States that he has chronic pain in his right shoulder, left hip, and right upper quadrant but his symptoms have continued to worsen.  Lab work is generally reassuring.  Troponins are flat.  Chest x-ray is negative.  Patient's pain  was treated with IV Dilaudid and he notes significant improvement and feels that he is back to his baseline.  Feel the patient is stable for discharge at this time and he is agreeable.  His oncologist is out sick this week and he has an upcoming MRI with contrast which he has an allergy to and he requested that he be prescribed his prednisone prep for this procedure.  Feel that this is reasonable.  Per up-to-date we will prescribe 3, 50 mg doses of prednisone to be taken 13 hours, 7 hours, and 1 hour prior to his procedure.  Discussed return  precautions in length.  His questions were answered and he was amicable at the time of discharge.  Note: Portions of this report may have been transcribed using voice recognition software. Every effort was made to ensure accuracy; however, inadvertent computerized transcription errors may be present.   Final Clinical Impression(s) / ED Diagnoses Final diagnoses:  Pain of metastatic malignancy  RUQ pain  Chronic right shoulder pain  Left hip pain   Rx / DC Orders ED Discharge Orders          Ordered    predniSONE (DELTASONE) 10 MG tablet        01/01/21 1837             Rayna Sexton, PA-C 01/01/21 1853    Regan Lemming, MD 01/01/21 2314

## 2021-01-01 NOTE — ED Notes (Signed)
Pt requesting additional pain meds. PA Logan made aware.

## 2021-01-01 NOTE — Telephone Encounter (Signed)
Received a call from patient stating that he is having more pain in his right side, right shoulder blade unrelieved by his pain medication.  Called to see what the best course of action would be.  Called [place] back to let him know Dr Marin Olp is out sick and our NP has an ultra full schedule due to taking on his patients.  Best course of action would be to go to the ED.  Patient is ok with doing this and appreciates the call.

## 2021-01-06 ENCOUNTER — Other Ambulatory Visit: Payer: Self-pay | Admitting: Hematology & Oncology

## 2021-01-06 ENCOUNTER — Inpatient Hospital Stay: Payer: 59

## 2021-01-06 ENCOUNTER — Telehealth: Payer: Self-pay | Admitting: *Deleted

## 2021-01-06 ENCOUNTER — Other Ambulatory Visit (HOSPITAL_BASED_OUTPATIENT_CLINIC_OR_DEPARTMENT_OTHER): Payer: Self-pay

## 2021-01-06 ENCOUNTER — Ambulatory Visit (HOSPITAL_BASED_OUTPATIENT_CLINIC_OR_DEPARTMENT_OTHER)
Admission: RE | Admit: 2021-01-06 | Discharge: 2021-01-06 | Disposition: A | Discharge status: Home / Self Care | Payer: 59 | Source: Ambulatory Visit | Attending: Hematology & Oncology | Admitting: Hematology & Oncology

## 2021-01-06 ENCOUNTER — Other Ambulatory Visit: Payer: Self-pay | Admitting: *Deleted

## 2021-01-06 ENCOUNTER — Other Ambulatory Visit: Payer: Self-pay

## 2021-01-06 VITALS — BP 137/89 | HR 116 | Temp 98.9°F | Resp 17

## 2021-01-06 DIAGNOSIS — M25559 Pain in unspecified hip: Secondary | ICD-10-CM

## 2021-01-06 DIAGNOSIS — C787 Secondary malignant neoplasm of liver and intrahepatic bile duct: Secondary | ICD-10-CM

## 2021-01-06 DIAGNOSIS — Z95828 Presence of other vascular implants and grafts: Secondary | ICD-10-CM

## 2021-01-06 DIAGNOSIS — C73 Malignant neoplasm of thyroid gland: Secondary | ICD-10-CM

## 2021-01-06 DIAGNOSIS — M25552 Pain in left hip: Secondary | ICD-10-CM | POA: Diagnosis not present

## 2021-01-06 DIAGNOSIS — R110 Nausea: Secondary | ICD-10-CM

## 2021-01-06 LAB — CMP (CANCER CENTER ONLY)
ALT: 23 U/L (ref 0–44)
AST: 24 U/L (ref 15–41)
Albumin: 4.1 g/dL (ref 3.5–5.0)
Alkaline Phosphatase: 116 U/L (ref 38–126)
Anion gap: 7 (ref 5–15)
BUN: 16 mg/dL (ref 6–20)
CO2: 29 mmol/L (ref 22–32)
Calcium: 9.6 mg/dL (ref 8.9–10.3)
Chloride: 101 mmol/L (ref 98–111)
Creatinine: 0.76 mg/dL (ref 0.61–1.24)
GFR, Estimated: >60 mL/min (ref >60)
Glucose, Bld: 123 mg/dL — ABNORMAL HIGH (ref 70–99)
Potassium: 3.9 mmol/L (ref 3.5–5.1)
Sodium: 137 mmol/L (ref 135–145)
Total Bilirubin: 0.4 mg/dL (ref 0.3–1.2)
Total Protein: 7.1 g/dL (ref 6.5–8.1)

## 2021-01-06 LAB — CBC WITH DIFFERENTIAL (CANCER CENTER ONLY)
Abs Immature Granulocytes: 0.02 10*3/uL (ref 0.00–0.07)
Basophils Absolute: 0 10*3/uL (ref 0.0–0.1)
Basophils Relative: 0 %
Eosinophils Absolute: 0.1 10*3/uL (ref 0.0–0.5)
Eosinophils Relative: 1 %
HCT: 36.6 % — ABNORMAL LOW (ref 39.0–52.0)
Hemoglobin: 12 g/dL — ABNORMAL LOW (ref 13.0–17.0)
Immature Granulocytes: 0 %
Lymphocytes Relative: 12 %
Lymphs Abs: 0.6 10*3/uL — ABNORMAL LOW (ref 0.7–4.0)
MCH: 28.4 pg (ref 26.0–34.0)
MCHC: 32.8 g/dL (ref 30.0–36.0)
MCV: 86.5 fL (ref 80.0–100.0)
Monocytes Absolute: 0.6 10*3/uL (ref 0.1–1.0)
Monocytes Relative: 11 %
Neutro Abs: 3.9 10*3/uL (ref 1.7–7.7)
Neutrophils Relative %: 76 %
Platelet Count: 210 10*3/uL (ref 150–400)
RBC: 4.23 MIL/uL (ref 4.22–5.81)
RDW: 13.7 % (ref 11.5–15.5)
WBC Count: 5.2 10*3/uL (ref 4.0–10.5)
nRBC: 0 % (ref 0.0–0.2)

## 2021-01-06 LAB — SAMPLE TO BLOOD BANK

## 2021-01-06 MED ORDER — HEPARIN SOD (PORK) LOCK FLUSH 100 UNIT/ML IV SOLN
500.0000 [IU] | Freq: Once | INTRAVENOUS | Status: AC
  Administered 2021-01-06: 500 [IU] via INTRAVENOUS

## 2021-01-06 MED ORDER — HYDROMORPHONE HCL 4 MG/ML IJ SOLN
4.0000 mg | Freq: Once | INTRAMUSCULAR | Status: AC
  Administered 2021-01-06: 4 mg via INTRAVENOUS
  Filled 2021-01-06: qty 1

## 2021-01-06 MED ORDER — SODIUM CHLORIDE 0.9 % IV SOLN: INTRAVENOUS | Status: DC

## 2021-01-06 MED ORDER — SODIUM CHLORIDE 0.9% FLUSH
10.0000 mL | Freq: Once | INTRAVENOUS | Status: AC
  Administered 2021-01-06: 10 mL via INTRAVENOUS

## 2021-01-06 MED ORDER — SODIUM CHLORIDE 0.9 % IV SOLN
10.0000 mg | Freq: Once | INTRAVENOUS | Status: AC
  Administered 2021-01-06: 10 mg via INTRAVENOUS
  Filled 2021-01-06: qty 10

## 2021-01-06 MED ORDER — HYDROMORPHONE HCL 8 MG PO TABS
8.0000 mg | ORAL_TABLET | ORAL | 0 refills | Status: DC | PRN
  Filled 2021-01-06: qty 120, 20d supply, fill #0

## 2021-01-06 NOTE — Patient Instructions (Signed)

## 2021-01-06 NOTE — Telephone Encounter (Signed)
Call received from patient stating that he is having "horrific left hip pain and also pain to the back side of his liver" which is not relieved by his pain medications at home.  Dr. Marin Olp notified.  Pt instructed to come in for left hip xray and for IV pain meds per order of Dr. Marin Olp.  Pt states that he can be here around 10:30AM.  Message sent to scheduling.

## 2021-01-06 NOTE — Patient Instructions (Signed)

## 2021-01-13 ENCOUNTER — Other Ambulatory Visit: Payer: Self-pay | Admitting: Hematology & Oncology

## 2021-01-13 ENCOUNTER — Other Ambulatory Visit (HOSPITAL_BASED_OUTPATIENT_CLINIC_OR_DEPARTMENT_OTHER): Payer: Self-pay

## 2021-01-13 DIAGNOSIS — C73 Malignant neoplasm of thyroid gland: Secondary | ICD-10-CM

## 2021-01-13 MED ORDER — TEMAZEPAM 30 MG PO CAPS
30.0000 mg | ORAL_CAPSULE | Freq: Every evening | ORAL | 0 refills | Status: DC | PRN
  Filled 2021-01-13 – 2021-01-17 (×3): qty 30, 30d supply, fill #0

## 2021-01-13 MED ORDER — PREDNISONE 50 MG PO TABS
ORAL_TABLET | ORAL | 0 refills | Status: DC
  Filled 2021-01-13: qty 3, 1d supply, fill #0

## 2021-01-13 MED ORDER — OXYCONTIN 30 MG PO T12A
30.0000 mg | EXTENDED_RELEASE_TABLET | Freq: Three times a day (TID) | ORAL | 0 refills | Status: DC
  Filled 2021-01-13 – 2021-01-17 (×3): qty 90, 30d supply, fill #0

## 2021-01-13 NOTE — Telephone Encounter (Signed)
Refill request for Prednisone 50 mg #3 (take prior to scans), Temazepam 30 mg # 30 and Oxycodone 30 mg #90. Last OV 12/19/20, next scheduled 01/16/21. Please advise if ok to refill, thanks!

## 2021-01-15 ENCOUNTER — Other Ambulatory Visit: Payer: Self-pay

## 2021-01-15 ENCOUNTER — Encounter (HOSPITAL_COMMUNITY)
Admission: RE | Admit: 2021-01-15 | Discharge: 2021-01-15 | Disposition: A | Discharge status: Home / Self Care | Payer: 59 | Source: Ambulatory Visit | Attending: Hematology & Oncology | Admitting: Hematology & Oncology

## 2021-01-15 ENCOUNTER — Ambulatory Visit (HOSPITAL_COMMUNITY)
Admission: RE | Admit: 2021-01-15 | Discharge: 2021-01-15 | Disposition: A | Discharge status: Home / Self Care | Payer: 59 | Source: Ambulatory Visit | Attending: Hematology & Oncology | Admitting: Hematology & Oncology

## 2021-01-15 ENCOUNTER — Other Ambulatory Visit (HOSPITAL_BASED_OUTPATIENT_CLINIC_OR_DEPARTMENT_OTHER): Payer: Self-pay

## 2021-01-15 DIAGNOSIS — C787 Secondary malignant neoplasm of liver and intrahepatic bile duct: Secondary | ICD-10-CM | POA: Diagnosis not present

## 2021-01-15 DIAGNOSIS — N200 Calculus of kidney: Secondary | ICD-10-CM | POA: Diagnosis not present

## 2021-01-15 DIAGNOSIS — C7951 Secondary malignant neoplasm of bone: Secondary | ICD-10-CM | POA: Diagnosis not present

## 2021-01-15 DIAGNOSIS — C73 Malignant neoplasm of thyroid gland: Secondary | ICD-10-CM | POA: Diagnosis not present

## 2021-01-15 DIAGNOSIS — K7689 Other specified diseases of liver: Secondary | ICD-10-CM | POA: Diagnosis not present

## 2021-01-15 DIAGNOSIS — R590 Localized enlarged lymph nodes: Secondary | ICD-10-CM | POA: Diagnosis not present

## 2021-01-15 LAB — GLUCOSE, CAPILLARY: Glucose-Capillary: 143 mg/dL — ABNORMAL HIGH (ref 70–99)

## 2021-01-15 MED ORDER — FLUDEOXYGLUCOSE F - 18 (FDG) INJECTION
13.3000 | Freq: Once | INTRAVENOUS | Status: AC | PRN
  Administered 2021-01-15: 13.3 via INTRAVENOUS

## 2021-01-15 MED ORDER — GADOBUTROL 1 MMOL/ML IV SOLN
10.0000 mL | Freq: Once | INTRAVENOUS | Status: AC | PRN
  Administered 2021-01-15: 10 mL via INTRAVENOUS

## 2021-01-16 ENCOUNTER — Other Ambulatory Visit (HOSPITAL_BASED_OUTPATIENT_CLINIC_OR_DEPARTMENT_OTHER): Payer: Self-pay

## 2021-01-16 ENCOUNTER — Inpatient Hospital Stay: Payer: 59 | Attending: Hematology & Oncology

## 2021-01-16 ENCOUNTER — Inpatient Hospital Stay: Payer: 59

## 2021-01-16 ENCOUNTER — Encounter: Payer: Self-pay | Admitting: Hematology & Oncology

## 2021-01-16 ENCOUNTER — Inpatient Hospital Stay (HOSPITAL_BASED_OUTPATIENT_CLINIC_OR_DEPARTMENT_OTHER): Payer: 59 | Admitting: Hematology & Oncology

## 2021-01-16 ENCOUNTER — Other Ambulatory Visit: Payer: Self-pay | Admitting: *Deleted

## 2021-01-16 VITALS — BP 114/79 | HR 102 | Temp 98.6°F | Resp 18 | Wt 266.1 lb

## 2021-01-16 VITALS — BP 122/78 | HR 78 | Temp 97.5°F | Resp 18

## 2021-01-16 DIAGNOSIS — C787 Secondary malignant neoplasm of liver and intrahepatic bile duct: Secondary | ICD-10-CM

## 2021-01-16 DIAGNOSIS — C73 Malignant neoplasm of thyroid gland: Secondary | ICD-10-CM

## 2021-01-16 DIAGNOSIS — G893 Neoplasm related pain (acute) (chronic): Secondary | ICD-10-CM

## 2021-01-16 DIAGNOSIS — R0781 Pleurodynia: Secondary | ICD-10-CM | POA: Diagnosis not present

## 2021-01-16 DIAGNOSIS — R1011 Right upper quadrant pain: Secondary | ICD-10-CM | POA: Diagnosis not present

## 2021-01-16 DIAGNOSIS — C7951 Secondary malignant neoplasm of bone: Secondary | ICD-10-CM | POA: Diagnosis not present

## 2021-01-16 DIAGNOSIS — R110 Nausea: Secondary | ICD-10-CM

## 2021-01-16 DIAGNOSIS — C799 Secondary malignant neoplasm of unspecified site: Secondary | ICD-10-CM

## 2021-01-16 DIAGNOSIS — Z95828 Presence of other vascular implants and grafts: Secondary | ICD-10-CM

## 2021-01-16 LAB — CBC WITH DIFFERENTIAL (CANCER CENTER ONLY)
Abs Immature Granulocytes: 0.04 K/uL (ref 0.00–0.07)
Basophils Absolute: 0 K/uL (ref 0.0–0.1)
Basophils Relative: 0 %
Eosinophils Absolute: 0 K/uL (ref 0.0–0.5)
Eosinophils Relative: 0 %
HCT: 35 % — ABNORMAL LOW (ref 39.0–52.0)
Hemoglobin: 11.3 g/dL — ABNORMAL LOW (ref 13.0–17.0)
Immature Granulocytes: 1 %
Lymphocytes Relative: 11 %
Lymphs Abs: 0.7 K/uL (ref 0.7–4.0)
MCH: 28 pg (ref 26.0–34.0)
MCHC: 32.3 g/dL (ref 30.0–36.0)
MCV: 86.6 fL (ref 80.0–100.0)
Monocytes Absolute: 0.5 K/uL (ref 0.1–1.0)
Monocytes Relative: 9 %
Neutro Abs: 5 K/uL (ref 1.7–7.7)
Neutrophils Relative %: 79 %
Platelet Count: 237 K/uL (ref 150–400)
RBC: 4.04 MIL/uL — ABNORMAL LOW (ref 4.22–5.81)
RDW: 13.7 % (ref 11.5–15.5)
WBC Count: 6.3 K/uL (ref 4.0–10.5)
nRBC: 0 % (ref 0.0–0.2)

## 2021-01-16 LAB — CMP (CANCER CENTER ONLY)
ALT: 18 U/L (ref 0–44)
AST: 18 U/L (ref 15–41)
Albumin: 3.9 g/dL (ref 3.5–5.0)
Alkaline Phosphatase: 106 U/L (ref 38–126)
Anion gap: 8 (ref 5–15)
BUN: 16 mg/dL (ref 6–20)
CO2: 28 mmol/L (ref 22–32)
Calcium: 9.4 mg/dL (ref 8.9–10.3)
Chloride: 104 mmol/L (ref 98–111)
Creatinine: 0.88 mg/dL (ref 0.61–1.24)
GFR, Estimated: >60 mL/min (ref >60)
Glucose, Bld: 140 mg/dL — ABNORMAL HIGH (ref 70–99)
Potassium: 3.8 mmol/L (ref 3.5–5.1)
Sodium: 140 mmol/L (ref 135–145)
Total Bilirubin: 0.3 mg/dL (ref 0.3–1.2)
Total Protein: 6.6 g/dL (ref 6.5–8.1)

## 2021-01-16 LAB — T4, FREE: Free T4: 1.51 ng/dL — ABNORMAL HIGH (ref 0.61–1.12)

## 2021-01-16 LAB — LACTATE DEHYDROGENASE: LDH: 186 U/L (ref 98–192)

## 2021-01-16 MED ORDER — HEPARIN SOD (PORK) LOCK FLUSH 100 UNIT/ML IV SOLN
500.0000 [IU] | Freq: Once | INTRAVENOUS | Status: AC
  Administered 2021-01-16: 500 [IU] via INTRAVENOUS

## 2021-01-16 MED ORDER — HYDROMORPHONE HCL 4 MG/ML IJ SOLN
4.0000 mg | Freq: Once | INTRAMUSCULAR | Status: AC
  Administered 2021-01-16: 4 mg via INTRAVENOUS
  Filled 2021-01-16: qty 1

## 2021-01-16 MED ORDER — SODIUM CHLORIDE 0.9 % IV SOLN
30.0000 mg | Freq: Once | INTRAVENOUS | Status: AC
  Administered 2021-01-16: 30 mg via INTRAVENOUS
  Filled 2021-01-16: qty 3

## 2021-01-16 MED ORDER — SODIUM CHLORIDE 0.9 % IV SOLN: 30.0000 mg | Freq: Once | INTRAVENOUS | Status: DC

## 2021-01-16 MED ORDER — SODIUM CHLORIDE 0.9% FLUSH
10.0000 mL | INTRAVENOUS | Status: DC | PRN
  Administered 2021-01-16: 10 mL via INTRAVENOUS

## 2021-01-16 MED ORDER — SODIUM CHLORIDE 0.9 % IV SOLN: 1000.0000 mL | INTRAVENOUS | Status: DC

## 2021-01-16 NOTE — Progress Notes (Signed)
Hematology and Oncology Follow Up Visit  SHAHEIM MAHAR [id number] 10-23-1975 45 y.o. 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 + Bilateral pulmonary emboli without right heart strain  DVT left popliteal vein    Past Therapy: Cabometyx 40 mg po q day -- start on 04/17/2019 - d/c on 07/17/2019 for progression Lenvima 24 mg po q day -- on hold due to proteinuria Taxotere/CDDP -- s/p cycle 4 - started on 09/13/2019   Current Therapy:     Gavreto 400 mg po q day -- started on 05/17/2020  -- d/c on 06/16/2020 Adriamycin weekly 2 weeks on 1 week off - s/p cycle 8 --d/c on 04/16/2020 due to progression XRT for LEFT hip met Pembrolizumab 200 mg IV q 3 week -- s/p cycle #8 - start on 06/26/2020 -- d/c on 01/16/2021 due to progression Zometa 4 mg IV q 3 months - next dose on 01/2021 Eliquis 5 mg po BID   Interim History:  [name] is here today for follow-up.  Unfortunately, looks like that there is progressive disease.  We did do an MRI and PET scan.  He has progressive disease on both scans.  The MRI shows progression within the liver.  He has progressive abdominal nodal metastasis.  There appears to be some new thoracic spine enhancement suspicious for osseous metastasis.    The PET scan shows multiorgan progression.  He has new upper abdominal lymphadenopathy.  He has new hypermetabolic skeletal metastasis.  He has enlargement of pulmonary nodules.  I have spoken with his doctor down at [facility].  We are sending down the scans and we will see if they have anything that they might be able to suggest.  I am not sure what else we can do.  I will have to look into the literature to see what might be available.  He is having little more in the way of pain.  He may have to think about some palliative radiation therapy to his spine.  He has had no cough.  He has had no change in bowel or bladder habits.  He is still on Eliquis  because of pulmonary embolism.  He has had no bleeding.  He did have a nice Thanksgiving.  I am happy that he was able to enjoy this with his family.  Overall, I would have to say that his performance status is probably ECOG 1.    Medications:  Allergies as of 01/16/2021       Reactions   Dextromethorphan-guaifenesin Other (See Comments)   Irregular heartbeat   Doxycycline Anaphylaxis, Nausea And Vomiting, Rash, Other (See Comments)   "heart arrythmia" and "dyspepsia" (only oral doxycycline causes reaction)   Gadobutrol Hives, Other (See Comments)   Patient had MRI scan at [place]. Patient called one hour after he left imaging facility to report two "blisters" that came up on "back" of lip.    Gadolinium Derivatives Hives   Patient had MRI scan at [place]. Patient called one hour after he left imaging facility to report two "blisters" that came up on "back" of lip.    Guaifenesin Palpitations      Ibuprofen Hives, Itching   Lisinopril Other (See Comments)   Angioedema   Pantoprazole Itching   Tramadol Hives, Itching   Barium Rash   Developed redness around neck after drinking 1st bottle of Barocat; pt was given Benedryl by ED Mds to be "on the safe  side" before drinking 2nd bottle   Chlorhexidine Hives        Medication List        Accurate as of January 16, 2021  4:46 PM. If you have any questions, ask your nurse or doctor.          cyclobenzaprine 10 MG tablet Commonly known as: FLEXERIL Take 1 tablet (10 mg total) by mouth 3 (three) times daily as needed.   diphenhydrAMINE 25 mg capsule Commonly known as: BENADRYL TAKE 1 CAPSULE BY MOUTH 1 HOUR BEFORE CT SCAN   Eliquis 5 MG Tabs tablet Generic drug: apixaban TAKE 1 TABLET (5 MG TOTAL) BY MOUTH 2 (TWO) TIMES DAILY.   EPINEPHrine 0.3 mg/0.3 mL Soaj injection Commonly known as: EpiPen 2-Pak USE AS DIRECTED FOR LIFE THREATENING ALLERGIC REACTIONS   famotidine 40 MG tablet Commonly known  as: PEPCID Take 1 tablet (40 mg total) by mouth 2 (two) times daily.   Fusion Plus Caps Take 1 capsule by mouth daily.   HYDROmorphone 8 MG tablet Commonly known as: DILAUDID Take 1 tablet (8 mg total) by mouth every 4 (four) hours as needed for severe pain.   levothyroxine 200 MCG tablet Commonly known as: SYNTHROID Take one tablet (200 mcg dose) by mouth daily. Total:  288 mcg/day.   levothyroxine 88 MCG tablet Commonly known as: SYNTHROID Take one tablet (88 mcg dose) by mouth daily. Total:  288 mcg/day.   LORazepam 1 MG tablet Commonly known as: ATIVAN Take 1 tablet (1 mg total) by mouth every 6 (six) hours as needed for anxiety.   methylphenidate 10 MG tablet Commonly known as: RITALIN Take 1 tablet (10 mg total) by mouth 2 (two) times daily.   metoCLOPramide 10 MG tablet Commonly known as: REGLAN Take 1 tablet (10 mg total) by mouth every 6 (six) hours as needed for nausea or vomiting.   multivitamin capsule Take 1 capsule by mouth daily.   OLANZapine 10 MG tablet Commonly known as: ZYPREXA TAKE 1 TABLET (10 MG TOTAL) BY MOUTH AT BEDTIME.   ondansetron 8 MG tablet Commonly known as: Zofran Take 1 tablet (8 mg total) by mouth 2 (two) times daily as needed (Nausea or vomiting).   OxyCONTIN 30 MG 12 hr tablet Generic drug: oxyCODONE Take 1 tablet (30 mg total) by mouth every 8 (eight) hours.   polyethylene glycol 17 g packet Commonly known as: MIRALAX / GLYCOLAX Take 17 g by mouth daily as needed for mild constipation.   predniSONE 10 MG tablet Commonly known as: DELTASONE Please take 3 doses of 50 mg oral prednisone 13 hours, 7 hours, and 1 hour prior to contrast administration.   predniSONE 50 MG tablet Commonly known as: DELTASONE TAKE 1 TABLET BY MOUTH 13 HOURS BEFORE CT SCAN, 1 TABLET 7 HOURS BEFORE CT SCAN, AND 1 TABLET 1 HOUR BEFORE CT SCAN   prochlorperazine 10 MG tablet Commonly known as: COMPAZINE Take 1 tablet (10 mg total) by mouth every 6  (six) hours as needed (Nausea or vomiting).   promethazine 25 MG tablet Commonly known as: PHENERGAN TAKE 1 TABLET BY MOUTH EVERY 6 HOURS AS NEEDED FOR NAUSEA & VOMITING   senna-docusate 8.6-50 MG tablet Commonly known as: Senokot-S Take 1 tablet by mouth at bedtime as needed for mild constipation.   temazepam 30 MG capsule Commonly known as: RESTORIL Take 1 capsule (30 mg total) by mouth at bedtime as needed for sleep.        Allergies:  Allergies  Allergen  Reactions   Dextromethorphan-Guaifenesin Other (See Comments)    Irregular heartbeat    Doxycycline Anaphylaxis, Nausea And Vomiting, Rash and Other (See Comments)    "heart arrythmia" and "dyspepsia" (only oral doxycycline causes reaction)   Gadobutrol Hives and Other (See Comments)    Patient had MRI scan at [place]. Patient called one hour after he left imaging facility to report two "blisters" that came up on "back" of lip.     Gadolinium Derivatives Hives    Patient had MRI scan at [place]. Patient called one hour after he left imaging facility to report two "blisters" that came up on "back" of lip.    Guaifenesin Palpitations        Ibuprofen Hives and Itching   Lisinopril Other (See Comments)    Angioedema   Pantoprazole Itching   Tramadol Hives and Itching   Barium Rash    Developed redness around neck after drinking 1st bottle of Barocat; pt was given Benedryl by ED Mds to be "on the safe side" before drinking 2nd bottle   Chlorhexidine Hives    Past Medical History, Surgical history, Social history, and Family History were reviewed and updated.  Review of Systems: Review of Systems  Constitutional:  Positive for malaise/fatigue.  HENT: Negative.    Eyes: Negative.   Respiratory: Negative.    Cardiovascular: Negative.   Gastrointestinal:  Positive for abdominal pain, constipation, heartburn and nausea.  Genitourinary: Negative.   Musculoskeletal:  Positive for joint pain.   Skin: Negative.   Neurological: Negative.   Endo/Heme/Allergies: Negative.   Psychiatric/Behavioral: Negative.      Physical Exam:  weight is 266 lb 1.9 oz (120.7 kg). His oral temperature is 98.6 F (37 C). His blood pressure is 114/79 and his pulse is 102 (abnormal). His respiration is 18 and oxygen saturation is 99%.   Wt Readings from Last 3 Encounters:  01/16/21 266 lb 1.9 oz (120.7 kg)  01/01/21 268 lb (121.6 kg)  12/19/20 268 lb (121.6 kg)    Physical Exam Vitals reviewed.  HENT:     Head: Normocephalic and atraumatic.  Eyes:     Pupils: Pupils are equal, round, and reactive to light.  Cardiovascular:     Rate and Rhythm: Normal rate and regular rhythm.     Heart sounds: Normal heart sounds.  Pulmonary:     Effort: Pulmonary effort is normal.     Breath sounds: Normal breath sounds.  Abdominal:     General: Bowel sounds are normal.     Palpations: Abdomen is soft.  Musculoskeletal:        General: No tenderness or deformity. Normal range of motion.     Cervical back: Normal range of motion.  Lymphadenopathy:     Cervical: No cervical adenopathy.  Skin:    General: Skin is warm and dry.     Findings: No erythema or rash.  Neurological:     Mental Status: He is alert and oriented to person, place, and time.  Psychiatric:        Behavior: Behavior normal.        Thought Content: Thought content normal.        Judgment: Judgment normal.     Lab Results  Component Value Date   WBC 6.3 01/16/2021   HGB 11.3 (L) 01/16/2021   HCT 35.0 (L) 01/16/2021   MCV 86.6 01/16/2021   PLT 237 01/16/2021   Lab Results  Component Value Date   FERRITIN 775 (H) 08/08/2020  IRON 67 08/08/2020   TIBC 214 08/08/2020   UIBC 147 08/08/2020   IRONPCTSAT 31 08/08/2020   Lab Results  Component Value Date   RETICCTPCT 3.9 (H) 08/08/2020   RBC 4.04 (L) 01/16/2021   No results found for: KPAFRELGTCHN, LAMBDASER, KAPLAMBRATIO No results found for: IGGSERUM, IGA,  IGMSERUM No results found for: Odetta Pink, SPEI   Chemistry      Component Value Date/Time   NA 140 01/16/2021 0835   NA 141 02/06/2019 1210   K 3.8 01/16/2021 0835   CL 104 01/16/2021 0835   CO2 28 01/16/2021 0835   BUN 16 01/16/2021 0835   BUN 8 02/06/2019 1210   CREATININE 0.88 01/16/2021 0835      Component Value Date/Time   CALCIUM 9.4 01/16/2021 0835   ALKPHOS 106 01/16/2021 0835   AST 18 01/16/2021 0835   ALT 18 01/16/2021 0835   BILITOT 0.3 01/16/2021 0835       Impression and Plan: [name] is a very pleasant 45 yo caucasian gentleman with an 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   We did have some response to immunotherapy.  However, looks like things are progressing now.  We will have to see what we might be able to try to help him out.    I want to see if there is any role for radiation therapy to painful areas.  I know that we have exhausted a lot of treatment options.  Again, I do not know if he will qualify for a Phase 1 clinical trial.  I suppose it is always an option.  However, he has to realize that the purpose of a Phase 1 clinical trial is to test for toxicity.  I know he is trying hard.  He still has a good performance status so we should still be able to help him out.  He will get IV fluid today.  I will give him some steroids.  We will see if this can help.  I know this is helped him in the past.  We will have to see about getting him back here in a couple weeks or so.  [name], MD 12/1/20224:46 PM

## 2021-01-16 NOTE — Progress Notes (Signed)
thy

## 2021-01-16 NOTE — Progress Notes (Signed)
No treatment today, patient to receive dexamethasone 30 mg IV per Dr. Marin Olp. Orders entered per his instructions.

## 2021-01-16 NOTE — Patient Instructions (Signed)

## 2021-01-17 ENCOUNTER — Encounter: Payer: Self-pay | Admitting: *Deleted

## 2021-01-17 ENCOUNTER — Other Ambulatory Visit (HOSPITAL_BASED_OUTPATIENT_CLINIC_OR_DEPARTMENT_OTHER): Payer: Self-pay

## 2021-01-17 LAB — THYROID PANEL WITH TSH
Free Thyroxine Index: 2.6 (ref 1.2–4.9)
T3 Uptake Ratio: 28 % (ref 24–39)
T4, Total: 9.3 ug/dL (ref 4.5–12.0)
TSH: 0.22 u[IU]/mL — ABNORMAL LOW (ref 0.450–4.500)

## 2021-01-17 NOTE — Progress Notes (Signed)
This nurse faxed imaging results to Dr. Charna Busman at [facility]. Fax # is [phone]. Chest Xray, hip scan, MRI of liver and Pet Scan sent. Received faxed confirmation.

## 2021-01-22 ENCOUNTER — Other Ambulatory Visit (HOSPITAL_BASED_OUTPATIENT_CLINIC_OR_DEPARTMENT_OTHER): Payer: Self-pay

## 2021-01-22 ENCOUNTER — Other Ambulatory Visit: Payer: Self-pay | Admitting: Hematology & Oncology

## 2021-01-22 MED ORDER — LORAZEPAM 1 MG PO TABS
1.0000 mg | ORAL_TABLET | Freq: Four times a day (QID) | ORAL | 0 refills | Status: DC | PRN
Start: 2021-01-22 — End: 2021-02-03
  Filled 2021-01-22: qty 30, 8d supply, fill #0

## 2021-01-28 ENCOUNTER — Other Ambulatory Visit (HOSPITAL_BASED_OUTPATIENT_CLINIC_OR_DEPARTMENT_OTHER): Payer: Self-pay

## 2021-01-29 ENCOUNTER — Other Ambulatory Visit: Payer: Self-pay | Admitting: Hematology & Oncology

## 2021-01-31 ENCOUNTER — Ambulatory Visit: Payer: 59

## 2021-01-31 ENCOUNTER — Other Ambulatory Visit: Payer: 59

## 2021-01-31 ENCOUNTER — Other Ambulatory Visit (HOSPITAL_BASED_OUTPATIENT_CLINIC_OR_DEPARTMENT_OTHER): Payer: Self-pay

## 2021-01-31 ENCOUNTER — Ambulatory Visit: Payer: 59 | Admitting: Family

## 2021-02-01 ENCOUNTER — Other Ambulatory Visit: Payer: Self-pay

## 2021-02-03 ENCOUNTER — Inpatient Hospital Stay: Payer: 59

## 2021-02-03 ENCOUNTER — Encounter: Payer: Self-pay | Admitting: Family

## 2021-02-03 ENCOUNTER — Inpatient Hospital Stay (HOSPITAL_BASED_OUTPATIENT_CLINIC_OR_DEPARTMENT_OTHER): Payer: 59 | Admitting: Family

## 2021-02-03 ENCOUNTER — Other Ambulatory Visit (HOSPITAL_BASED_OUTPATIENT_CLINIC_OR_DEPARTMENT_OTHER): Payer: Self-pay

## 2021-02-03 ENCOUNTER — Other Ambulatory Visit: Payer: Self-pay | Admitting: Family

## 2021-02-03 ENCOUNTER — Other Ambulatory Visit: Payer: Self-pay

## 2021-02-03 VITALS — BP 132/99 | HR 111 | Temp 98.0°F | Resp 18 | Ht 70.0 in | Wt 264.1 lb

## 2021-02-03 DIAGNOSIS — R110 Nausea: Secondary | ICD-10-CM

## 2021-02-03 DIAGNOSIS — D509 Iron deficiency anemia, unspecified: Secondary | ICD-10-CM

## 2021-02-03 DIAGNOSIS — C799 Secondary malignant neoplasm of unspecified site: Secondary | ICD-10-CM

## 2021-02-03 DIAGNOSIS — R1011 Right upper quadrant pain: Secondary | ICD-10-CM | POA: Diagnosis not present

## 2021-02-03 DIAGNOSIS — C787 Secondary malignant neoplasm of liver and intrahepatic bile duct: Secondary | ICD-10-CM

## 2021-02-03 DIAGNOSIS — M546 Pain in thoracic spine: Secondary | ICD-10-CM

## 2021-02-03 DIAGNOSIS — C73 Malignant neoplasm of thyroid gland: Secondary | ICD-10-CM

## 2021-02-03 DIAGNOSIS — R0781 Pleurodynia: Secondary | ICD-10-CM | POA: Diagnosis not present

## 2021-02-03 DIAGNOSIS — C7951 Secondary malignant neoplasm of bone: Secondary | ICD-10-CM | POA: Diagnosis not present

## 2021-02-03 LAB — CBC WITH DIFFERENTIAL (CANCER CENTER ONLY)
Abs Immature Granulocytes: 0.01 10*3/uL (ref 0.00–0.07)
Basophils Absolute: 0 10*3/uL (ref 0.0–0.1)
Basophils Relative: 1 %
Eosinophils Absolute: 0.1 10*3/uL (ref 0.0–0.5)
Eosinophils Relative: 2 %
HCT: 35.5 % — ABNORMAL LOW (ref 39.0–52.0)
Hemoglobin: 11.4 g/dL — ABNORMAL LOW (ref 13.0–17.0)
Immature Granulocytes: 0 %
Lymphocytes Relative: 14 %
Lymphs Abs: 0.5 10*3/uL — ABNORMAL LOW (ref 0.7–4.0)
MCH: 27.4 pg (ref 26.0–34.0)
MCHC: 32.1 g/dL (ref 30.0–36.0)
MCV: 85.3 fL (ref 80.0–100.0)
Monocytes Absolute: 0.3 10*3/uL (ref 0.1–1.0)
Monocytes Relative: 10 %
Neutro Abs: 2.6 10*3/uL (ref 1.7–7.7)
Neutrophils Relative %: 73 %
Platelet Count: 228 10*3/uL (ref 150–400)
RBC: 4.16 MIL/uL — ABNORMAL LOW (ref 4.22–5.81)
RDW: 14.3 % (ref 11.5–15.5)
WBC Count: 3.5 10*3/uL — ABNORMAL LOW (ref 4.0–10.5)
nRBC: 0 % (ref 0.0–0.2)

## 2021-02-03 LAB — CMP (CANCER CENTER ONLY)
ALT: 24 U/L (ref 0–44)
AST: 34 U/L (ref 15–41)
Albumin: 3.9 g/dL (ref 3.5–5.0)
Alkaline Phosphatase: 116 U/L (ref 38–126)
Anion gap: 6 (ref 5–15)
BUN: 14 mg/dL (ref 6–20)
CO2: 30 mmol/L (ref 22–32)
Calcium: 9.6 mg/dL (ref 8.9–10.3)
Chloride: 101 mmol/L (ref 98–111)
Creatinine: 0.77 mg/dL (ref 0.61–1.24)
GFR, Estimated: >60 mL/min (ref >60)
Glucose, Bld: 106 mg/dL — ABNORMAL HIGH (ref 70–99)
Potassium: 4.2 mmol/L (ref 3.5–5.1)
Sodium: 137 mmol/L (ref 135–145)
Total Bilirubin: 0.6 mg/dL (ref 0.3–1.2)
Total Protein: 6.6 g/dL (ref 6.5–8.1)

## 2021-02-03 LAB — LACTATE DEHYDROGENASE: LDH: 309 U/L — ABNORMAL HIGH (ref 98–192)

## 2021-02-03 MED ORDER — LORAZEPAM 1 MG PO TABS
1.0000 mg | ORAL_TABLET | Freq: Four times a day (QID) | ORAL | 0 refills | Status: DC | PRN
  Filled 2021-02-03: qty 30, 8d supply, fill #0

## 2021-02-03 MED ORDER — PREDNISONE 20 MG PO TABS
ORAL_TABLET | ORAL | 0 refills | Status: DC
  Filled 2021-02-03: qty 30, 19d supply, fill #0

## 2021-02-03 MED ORDER — PROMETHAZINE HCL 25 MG PO TABS
ORAL_TABLET | ORAL | 0 refills | Status: DC
  Filled 2021-02-03: qty 120, 30d supply, fill #0

## 2021-02-03 MED ORDER — SODIUM CHLORIDE 0.9 % IV SOLN
30.0000 mg | Freq: Once | INTRAVENOUS | Status: AC
  Administered 2021-02-03: 12:00:00 30 mg via INTRAVENOUS
  Filled 2021-02-03: qty 3

## 2021-02-03 MED ORDER — SODIUM CHLORIDE 0.9% FLUSH
10.0000 mL | Freq: Once | INTRAVENOUS | Status: AC | PRN
  Administered 2021-02-03: 13:00:00 10 mL

## 2021-02-03 MED ORDER — HYDROMORPHONE HCL 4 MG/ML IJ SOLN
4.0000 mg | Freq: Once | INTRAMUSCULAR | Status: AC
  Administered 2021-02-03: 12:00:00 4 mg via INTRAVENOUS
  Filled 2021-02-03: qty 1

## 2021-02-03 MED ORDER — HYDROMORPHONE HCL 8 MG PO TABS
8.0000 mg | ORAL_TABLET | ORAL | 0 refills | Status: DC | PRN
  Filled 2021-02-03: qty 120, 20d supply, fill #0

## 2021-02-03 MED ORDER — HEPARIN SOD (PORK) LOCK FLUSH 100 UNIT/ML IV SOLN
500.0000 [IU] | Freq: Once | INTRAVENOUS | Status: AC | PRN
  Administered 2021-02-03: 13:00:00 500 [IU]

## 2021-02-03 MED ORDER — SODIUM CHLORIDE 0.9 % IV SOLN: Freq: Once | INTRAVENOUS | Status: AC

## 2021-02-03 NOTE — Patient Instructions (Signed)
Dehydration, Adult Dehydration is condition in which there is not enough water or other fluids in the body. This happens when a person loses more fluids than he or she takes in. Important body parts cannot work right without the right amount of fluids. Any loss of fluids from the body can cause dehydration. Dehydration can be mild, worse, or very bad. It should be treated right away to keep it from getting very bad. What are the causes? This condition may be caused by: Conditions that cause loss of water or other fluids, such as: Watery poop (diarrhea). Vomiting. Sweating a lot. Peeing (urinating) a lot. Not drinking enough fluids, especially when you: Are ill. Are doing things that take a lot of energy to do. Other illnesses and conditions, such as fever or infection. Certain medicines, such as medicines that take extra fluid out of the body (diuretics). Lack of safe drinking water. Not being able to get enough water and food. What increases the risk? The following factors may make you more likely to develop this condition: Having a long-term (chronic) illness that has not been treated the right way, such as: Diabetes. Heart disease. Kidney disease. Being 45 years of age or older. Having a disability. Living in a place that is high above the ground or sea (high in altitude). The thinner, dried air causes more fluid loss. Doing exercises that put stress on your body for a long time. What are the signs or symptoms? Symptoms of dehydration depend on how bad it is. Mild or worse dehydration Thirst. Dry lips or dry mouth. Feeling dizzy or light-headed, especially when you stand up from sitting. Muscle cramps. Your body making: Dark pee (urine). Pee may be the color of tea. Less pee than normal. Less tears than normal. Headache. Very bad dehydration Changes in skin. Skin may: Be cold to the touch (clammy). Be blotchy or pale. Not go back to normal right after you lightly pinch  it and let it go. Little or no tears, pee, or sweat. Changes in vital signs, such as: Fast breathing. Low blood pressure. Weak pulse. Pulse that is more than 100 beats a minute when you are sitting still. Other changes, such as: Feeling very thirsty. Eyes that look hollow (sunken). Cold hands and feet. Being mixed up (confused). Being very tired (lethargic) or having trouble waking from sleep. Short-term weight loss. Loss of consciousness. How is this treated? Treatment for this condition depends on how bad it is. Treatment should start right away. Do not wait until your condition gets very bad. Very bad dehydration is an emergency. You will need to go to a hospital. Mild or worse dehydration can be treated at home. You may be asked to: Drink more fluids. Drink an oral rehydration solution (ORS). This drink helps get the right amounts of fluids and salts and minerals in the blood (electrolytes). Very bad dehydration can be treated: With fluids through an IV tube. By getting normal levels of salts and minerals in your blood. This is often done by giving salts and minerals through a tube. The tube is passed through your nose and into your stomach. By treating the root cause. Follow these instructions at home: Oral rehydration solution If told by your doctor, drink an ORS: Make an ORS. Use instructions on the package. Start by drinking small amounts, about  cup (120 mL) every 5-10 minutes. Slowly drink more until you have had the amount that your doctor said to have. Eating and drinking  Drink enough clear fluid to keep your pee pale yellow. If you were told to drink an ORS, finish the ORS first. Then, start slowly drinking other clear fluids. Drink fluids such as: Water. Do not drink only water. Doing that can make the salt (sodium) level in your body get too low. Water from ice chips you suck on. Fruit juice that you have added water to (diluted). Low-calorie sports  drinks. Eat foods that have the right amounts of salts and minerals, such as: Bananas. Oranges. Potatoes. Tomatoes. Spinach. Do not drink alcohol. Avoid: Drinks that have a lot of sugar. These include: High-calorie sports drinks. Fruit juice that you did not add water to. Soda. Caffeine. Foods that are greasy or have a lot of fat or sugar. General instructions Take over-the-counter and prescription medicines only as told by your doctor. Do not take salt tablets. Doing that can make the salt level in your body get too high. Return to your normal activities as told by your doctor. Ask your doctor what activities are safe for you. Keep all follow-up visits as told by your doctor. This is important. Contact a doctor if: You have pain in your belly (abdomen) and the pain: Gets worse. Stays in one place. You have a rash. You have a stiff neck. You get angry or annoyed (irritable) more easily than normal. You are more tired or have a harder time waking than normal. You feel: Weak or dizzy. Very thirsty. Get help right away if you have: Any symptoms of very bad dehydration. Symptoms of vomiting, such as: You cannot eat or drink without vomiting. Your vomiting gets worse or does not go away. Your vomit has blood or green stuff in it. Symptoms that get worse with treatment. A fever. A very bad headache. Problems with peeing or pooping (having a bowel movement), such as: Watery poop that gets worse or does not go away. Blood in your poop (stool). This may cause poop to look black and tarry. Not peeing in 6-8 hours. Peeing only a small amount of very dark pee in 6-8 hours. Trouble breathing. These symptoms may be an emergency. Do not wait to see if the symptoms will go away. Get medical help right away. Call your local emergency services (911 in the [place] Do not drive yourself to the hospital. Summary Dehydration is a condition in which there is not enough water or other fluids  in the body. This happens when a person loses more fluids than he or she takes in. Treatment for this condition depends on how bad it is. Treatment should be started right away. Do not wait until your condition gets very bad. Drink enough clear fluid to keep your pee pale yellow. If you were told to drink an oral rehydration solution (ORS), finish the ORS first. Then, start slowly drinking other clear fluids. Take over-the-counter and prescription medicines only as told by your doctor. Get help right away if you have any symptoms of very bad dehydration. This information is not intended to replace advice given to you by your health care provider. Make sure you discuss any questions you have with your health care provider. Document Revised: 09/15/2018 Document Reviewed: 09/15/2018 Elsevier Patient Education  [employer].

## 2021-02-03 NOTE — Progress Notes (Addendum)
Hematology and Oncology Follow Up Visit  Brian [name] [id number] 09-May-1975 45 y.o. 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 + Bilateral pulmonary emboli without right heart strain  DVT left popliteal vein    Past Therapy: Cabometyx 40 mg po q day -- start on 04/17/2019 - d/c on 07/17/2019 for progression Lenvima 24 mg po q day -- on hold due to proteinuria Taxotere/CDDP -- s/p cycle 4 - started on 09/13/2019   Current Therapy:     Gavreto 400 mg po q day -- started on 05/17/2020  -- d/c on 06/16/2020 Adriamycin weekly 2 weeks on 1 week off - s/p cycle 8 --d/c on 04/16/2020 due to progression XRT for LEFT hip met Pembrolizumab 200 mg IV q 3 week -- s/p cycle #8 - start on 06/26/2020 -- d/c on 01/16/2021 due to progression Vinblastine 6 mg/m2 q week -- 4 weeks on/1 week off -- start on 02/06/2021 Zometa 4 mg IV q 3 months - next dose on 01/2021 Eliquis 5 mg po BID               Interim History:  [name] is here today for follow-up. He is having pain in his back right under his rib cage on both sides as well as right upper quadrant pain.  He had significant progression on recent PET scan and Dr. Marin [name] plans to start him on Vinblastine later this week after consulting with MD Brian [name].  He notes mild dizziness when he lays down at night.  No falls or syncope to report. No swelling, numbness or tingling in his extremities at this time.  He has occasional twinges of pain in the left hip.  No fever, chills, cough, rash, SOB, chest pain, palpitations or changes in bowel or bladder habits.   He has intermittent episodes of nausea without vomiting. He states that Ativan is effective in treating.  No blood loss noted. No bruising or petechiae.   ECOG Performance Status: 1 - Symptomatic but completely ambulatory  Medications:  Allergies as of 02/03/2021       Reactions   Dextromethorphan-guaifenesin Other (See Comments)    Irregular heartbeat   Doxycycline Anaphylaxis, Nausea And Vomiting, Rash, Other (See Comments)   "heart arrythmia" and "dyspepsia" (only oral doxycycline causes reaction)   Gadobutrol Hives, Other (See Comments)   Patient had MRI scan at [place]. Patient called one hour after he left imaging facility to report two "blisters" that came up on "back" of lip.    Gadolinium Derivatives Hives   Patient had MRI scan at [place]. Patient called one hour after he left imaging facility to report two "blisters" that came up on "back" of lip.    Guaifenesin Palpitations      Ibuprofen Hives, Itching   Lisinopril Other (See Comments)   Angioedema   Pantoprazole Itching   Tramadol Hives, Itching   Barium Rash   Developed redness around neck after drinking 1st bottle of Barocat; pt was given Benedryl by ED Mds to be "on the safe side" before drinking 2nd bottle   Chlorhexidine Hives        Medication List        Accurate as of February 03, 2021 10:42 AM. If you have any questions, ask your nurse or doctor.          cyclobenzaprine 10 MG tablet Commonly known as: FLEXERIL Take 1 tablet (10 mg total) by mouth 3 (  three) times daily as needed.   diphenhydrAMINE 25 mg capsule Commonly known as: BENADRYL TAKE 1 CAPSULE BY MOUTH 1 HOUR BEFORE CT SCAN   Eliquis 5 MG Tabs tablet Generic drug: apixaban TAKE 1 TABLET (5 MG TOTAL) BY MOUTH 2 (TWO) TIMES DAILY.   EPINEPHrine 0.3 mg/0.3 mL Soaj injection Commonly known as: EpiPen 2-Pak USE AS DIRECTED FOR LIFE THREATENING ALLERGIC REACTIONS   famotidine 40 MG tablet Commonly known as: PEPCID Take 1 tablet (40 mg total) by mouth 2 (two) times daily.   Fusion Plus Caps Take 1 capsule by mouth daily.   HYDROmorphone 8 MG tablet Commonly known as: DILAUDID Take 1 tablet (8 mg total) by mouth every 4 (four) hours as needed for severe pain.   levothyroxine 200 MCG tablet Commonly known as: SYNTHROID Take one tablet  (200 mcg dose) by mouth daily. Total:  288 mcg/day.   levothyroxine 88 MCG tablet Commonly known as: SYNTHROID Take one tablet (88 mcg dose) by mouth daily. Total:  288 mcg/day.   LORazepam 1 MG tablet Commonly known as: ATIVAN Take 1 tablet (1 mg total) by mouth every 6 (six) hours as needed for anxiety.   methylphenidate 10 MG tablet Commonly known as: RITALIN Take 1 tablet (10 mg total) by mouth 2 (two) times daily.   metoCLOPramide 10 MG tablet Commonly known as: REGLAN Take 1 tablet (10 mg total) by mouth every 6 (six) hours as needed for nausea or vomiting.   multivitamin capsule Take 1 capsule by mouth daily.   OLANZapine 10 MG tablet Commonly known as: ZYPREXA TAKE 1 TABLET (10 MG TOTAL) BY MOUTH AT BEDTIME.   ondansetron 8 MG tablet Commonly known as: Zofran Take 1 tablet (8 mg total) by mouth 2 (two) times daily as needed (Nausea or vomiting).   OxyCONTIN 30 MG 12 hr tablet Generic drug: oxyCODONE Take 1 tablet (30 mg total) by mouth every 8 (eight) hours.   polyethylene glycol 17 g packet Commonly known as: MIRALAX / GLYCOLAX Take 17 g by mouth daily as needed for mild constipation.   predniSONE 10 MG tablet Commonly known as: DELTASONE Please take 3 doses of 50 mg oral prednisone 13 hours, 7 hours, and 1 hour prior to contrast administration.   predniSONE 50 MG tablet Commonly known as: DELTASONE TAKE 1 TABLET BY MOUTH 13 HOURS BEFORE CT SCAN, 1 TABLET 7 HOURS BEFORE CT SCAN, AND 1 TABLET 1 HOUR BEFORE CT SCAN   prochlorperazine 10 MG tablet Commonly known as: COMPAZINE Take 1 tablet (10 mg total) by mouth every 6 (six) hours as needed (Nausea or vomiting).   promethazine 25 MG tablet Commonly known as: PHENERGAN TAKE 1 TABLET BY MOUTH EVERY 6 HOURS AS NEEDED FOR NAUSEA & VOMITING   senna-docusate 8.6-50 MG tablet Commonly known as: Senokot-S Take 1 tablet by mouth at bedtime as needed for mild constipation.   temazepam 30 MG capsule Commonly  known as: RESTORIL Take 1 capsule (30 mg total) by mouth at bedtime as needed for sleep.        Allergies:  Allergies  Allergen Reactions   Dextromethorphan-Guaifenesin Other (See Comments)    Irregular heartbeat    Doxycycline Anaphylaxis, Nausea And Vomiting, Rash and Other (See Comments)    "heart arrythmia" and "dyspepsia" (only oral doxycycline causes reaction)   Gadobutrol Hives and Other (See Comments)    Patient had MRI scan at [place]. Patient called one hour after he left imaging facility to report two "blisters" that came  up on "back" of lip.     Gadolinium Derivatives Hives    Patient had MRI scan at [place]. Patient called one hour after he left imaging facility to report two "blisters" that came up on "back" of lip.    Guaifenesin Palpitations        Ibuprofen Hives and Itching   Lisinopril Other (See Comments)    Angioedema   Pantoprazole Itching   Tramadol Hives and Itching   Barium Rash    Developed redness around neck after drinking 1st bottle of Barocat; pt was given Benedryl by ED Mds to be "on the safe side" before drinking 2nd bottle   Chlorhexidine Hives    Past Medical History, Surgical history, Social history, and Family History were reviewed and updated.  Review of Systems: All other 10 point review of systems is negative.   Physical Exam:  vitals were not taken for this visit.   Wt Readings from Last 3 Encounters:  01/16/21 266 lb 1.9 oz (120.7 kg)  01/01/21 268 lb (121.6 kg)  12/19/20 268 lb (121.6 kg)    Ocular: Sclerae unicteric, pupils equal, round and reactive to light Ear-nose-throat: Oropharynx clear, dentition fair Lymphatic: No cervical or supraclavicular adenopathy Lungs no rales or rhonchi, good excursion bilaterally Heart regular rate and rhythm, no murmur appreciated Abd soft, nontender, positive bowel sounds MSK no focal spinal tenderness, no joint edema Neuro: non-focal, well-oriented, appropriate  affect Breasts: Deferred   Lab Results  Component Value Date   WBC 3.5 (L) 02/03/2021   HGB 11.4 (L) 02/03/2021   HCT 35.5 (L) 02/03/2021   MCV 85.3 02/03/2021   PLT 228 02/03/2021   Lab Results  Component Value Date   FERRITIN 775 (H) 08/08/2020   IRON 67 08/08/2020   TIBC 214 08/08/2020   UIBC 147 08/08/2020   IRONPCTSAT 31 08/08/2020   Lab Results  Component Value Date   RETICCTPCT 3.9 (H) 08/08/2020   RBC 4.16 (L) 02/03/2021   No results found for: KPAFRELGTCHN, LAMBDASER, KAPLAMBRATIO No results found for: IGGSERUM, IGA, IGMSERUM No results found for: Odetta Pink, SPEI   Chemistry      Component Value Date/Time   NA 140 01/16/2021 0835   NA 141 02/06/2019 1210   K 3.8 01/16/2021 0835   CL 104 01/16/2021 0835   CO2 28 01/16/2021 0835   BUN 16 01/16/2021 0835   BUN 8 02/06/2019 1210   CREATININE 0.88 01/16/2021 0835      Component Value Date/Time   CALCIUM 9.4 01/16/2021 0835   ALKPHOS 106 01/16/2021 0835   AST 18 01/16/2021 0835   ALT 18 01/16/2021 0835   BILITOT 0.3 01/16/2021 0835       Impression and Plan: 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  He is here today for fluids and IV pain management. He also received IV dilaudid and steroid.  We will also send him home on a Prednisone taper.  Phenergan and Dilaudid refilled.  We will schedule him for his first Vinblastine treatment per Dr. Marin [name] on Thursday 02/06/21.  Follow-up in 3 weeks.   Lottie Dawson, NP 12/19/202210:42 AM  ADDENDUM: Unfortunately, we got had to make a change in treatment.  There is not many options that are left out there for [place].  There have been a few articles utilizing vinblastine.  I think this would be a reasonable and  should be well-tolerated.  We will use single agent vinblastine.  We will do this weekly for 4 weeks on and 1 week  off.  We probably would do 2 months of treatment and then repeat scans.  I think though that we will know if things are working just by his clinical symptoms of pain.  Maybe, MD Brian [name] might have a better idea.  They have not yet gotten back with [name].  We will try to get started with treatment on 02/06/2021.  I will plan to see him back in January when he starts the second 4-week cycle.  Lattie Haw, MD

## 2021-02-03 NOTE — Patient Instructions (Signed)

## 2021-02-03 NOTE — Addendum Note (Signed)
Addended by: Burney Gauze R on: 02/03/2021 03:54 PM   Modules accepted: Orders, Level of Service

## 2021-02-06 ENCOUNTER — Inpatient Hospital Stay: Payer: 59

## 2021-02-06 ENCOUNTER — Encounter: Payer: Self-pay | Admitting: Hematology & Oncology

## 2021-02-06 NOTE — Progress Notes (Unsigned)
° °  Konor P. Fakhouri                                                           Dec. 22.2022  Case #:  [id number]  Urgent Appeals for Chemotherapy Treatment  Dear Sir/Madam:  This letter is being written in support of chemotherapy for my patient, Mr. KESEAN SERVISS.  He is 45 years old.  He has metastatic Hurthle cell carcinoma.  He has already been through 4 different lines of treatment.  He has received Taxotere/cisplatin him, Adriamycin, pembrolizumab, Cabometyx, Lenvima, and Gavreto.  He has been down to the [facility] for consideration of experimental therapy, but unfortunately, he does not qualify for any protocol.   I am trying to give him a chance for further life.  I realize that we are not going to cure him but if we can help him extend his life so he can enjoy his family and have decent quality, I think this would be reasonable.  I found a couple articles that utilized vinblastine.  Again this is a old time chemotherapy agent.  It would be tolerable.  It would not affect him negatively so I think his quality of life would still be reasonable.  There is no guidelines per NCCN for Hurthle cell carcinoma that has been treated multiple times.  I am just trying to help him and again try to extend his life so he can enjoy his family.  I think that vinblastine is very reasonable.  Again it may not work but least it is a chance that he has.  I would dose this weekly for 4 weeks on and 1 week off.  After 2 full cycles I would then repeat the radiographic studies to see if he is responding.  I am imploring you to approve this treatment so that my patient will have a chance.  He realizes he is not going to be cured.  He realizes the chance of responding will be about 20-25% at best.  He still would like to take that chance for a prolonged life expectancy.  I do not see any reason why this could not be done for him.  He is young.  He still has a decent performance status.  Please  consider my request seriously and please allow me to help my patient the best way that I can.   Respectfully yours,   [name], MD

## 2021-02-12 ENCOUNTER — Other Ambulatory Visit: Payer: Self-pay | Admitting: *Deleted

## 2021-02-12 DIAGNOSIS — M546 Pain in thoracic spine: Secondary | ICD-10-CM

## 2021-02-12 DIAGNOSIS — C799 Secondary malignant neoplasm of unspecified site: Secondary | ICD-10-CM

## 2021-02-12 DIAGNOSIS — C787 Secondary malignant neoplasm of liver and intrahepatic bile duct: Secondary | ICD-10-CM

## 2021-02-13 ENCOUNTER — Other Ambulatory Visit: Payer: Self-pay

## 2021-02-13 ENCOUNTER — Inpatient Hospital Stay: Payer: 59

## 2021-02-13 ENCOUNTER — Encounter: Payer: Self-pay | Admitting: Urology

## 2021-02-13 ENCOUNTER — Encounter: Payer: Self-pay | Admitting: *Deleted

## 2021-02-13 DIAGNOSIS — M546 Pain in thoracic spine: Secondary | ICD-10-CM

## 2021-02-13 DIAGNOSIS — C787 Secondary malignant neoplasm of liver and intrahepatic bile duct: Secondary | ICD-10-CM

## 2021-02-13 DIAGNOSIS — C73 Malignant neoplasm of thyroid gland: Secondary | ICD-10-CM

## 2021-02-13 NOTE — Progress Notes (Signed)
Patient reports overall joint pain 5/10. No other symptoms reported at this time.   Meaningful use complete.  There were no vitals taken for this visit.

## 2021-02-14 ENCOUNTER — Ambulatory Visit
Admission: RE | Admit: 2021-02-14 | Discharge: 2021-02-14 | Disposition: A | Discharge status: Home / Self Care | Payer: 59 | Source: Ambulatory Visit | Attending: Urology | Admitting: Urology

## 2021-02-14 DIAGNOSIS — C227 Other specified carcinomas of liver: Secondary | ICD-10-CM | POA: Insufficient documentation

## 2021-02-14 DIAGNOSIS — Z86718 Personal history of other venous thrombosis and embolism: Secondary | ICD-10-CM | POA: Diagnosis not present

## 2021-02-14 DIAGNOSIS — C787 Secondary malignant neoplasm of liver and intrahepatic bile duct: Secondary | ICD-10-CM | POA: Diagnosis not present

## 2021-02-14 DIAGNOSIS — I110 Hypertensive heart disease with heart failure: Secondary | ICD-10-CM | POA: Insufficient documentation

## 2021-02-14 DIAGNOSIS — C73 Malignant neoplasm of thyroid gland: Secondary | ICD-10-CM | POA: Insufficient documentation

## 2021-02-14 DIAGNOSIS — R918 Other nonspecific abnormal finding of lung field: Secondary | ICD-10-CM | POA: Diagnosis not present

## 2021-02-14 DIAGNOSIS — G893 Neoplasm related pain (acute) (chronic): Secondary | ICD-10-CM | POA: Diagnosis not present

## 2021-02-14 DIAGNOSIS — I509 Heart failure, unspecified: Secondary | ICD-10-CM | POA: Diagnosis not present

## 2021-02-14 DIAGNOSIS — Z7901 Long term (current) use of anticoagulants: Secondary | ICD-10-CM | POA: Insufficient documentation

## 2021-02-14 DIAGNOSIS — I10 Essential (primary) hypertension: Secondary | ICD-10-CM | POA: Diagnosis not present

## 2021-02-14 DIAGNOSIS — N200 Calculus of kidney: Secondary | ICD-10-CM | POA: Insufficient documentation

## 2021-02-14 DIAGNOSIS — C7951 Secondary malignant neoplasm of bone: Secondary | ICD-10-CM | POA: Insufficient documentation

## 2021-02-14 DIAGNOSIS — Z87442 Personal history of urinary calculi: Secondary | ICD-10-CM | POA: Insufficient documentation

## 2021-02-14 NOTE — Progress Notes (Signed)
Radiation Oncology         [phone] ________________________________  Outpatient Re-Consultation - Conducted via telephone due to current COVID-19 concerns for limiting patient exposure  Name: Brian [name] MRN: [id number]  Date of Service: 02/14/2021 DOB: 05-15-1975  MI:WOEHOZ, [name], [name]  [name], [name]   REFERRING PHYSICIAN: [name], [name]  DIAGNOSIS: 45 y.o. 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    ICD-10-CM   1. Primary cancer of thyroid with metastasis to other site [facility])  C73       HISTORY OF PRESENT ILLNESS: Brian [name] is a 45 y.o. male with known metastatic thyroid cancer, seen at the request of Dr. Marin Olp.  Brian [name] has a history of aggressive, Hurthle cell carcinoma of the thyroid and has been on multiple courses of systemic therapy under the care and direction of Dr. Marin Olp.  We initially met him in 03/2020 when Brian [name] presented to the emergency department on 04/11/2020 with worsening, severe abdominal pain despite a recent increase in his narcotic pain medications.  A CT A/P on admission showed interval enlargement of a known metastatic liver lesion (7.0 x 6.2 x 6.3 cm) and a portacaval lymph node (6.8 x 5.2 cm) as compared to his prior imaging on 02/08/2020. Brian [name] was admitted for pain control and we were consulted for consideration of palliative radiotherapy which was completed on 04/26/20.  Unfortunately, follow-up restaging MRI abdomen showed disease progression in the liver lesions despite the recent course of radiotherapy and systemic treatment with Gavreto 400 mg daily.  Brian [name] was seen at [name] at the end of April 2022 and the recommendation was to d/c the [facility] and proceed with pembrolizumab (Keytruda) 200 mg every 3 weeks which was started under the care of Dr. Marin Olp on 06/26/2020.  As part of his work-up at [name], Brian [name] had a repeat PET scan on 06/14/2020 which showed the treated portacaval node  has decreased in size but there were persistent liver mets as well as a new 3 cm lytic lesion involving the left ilium with soft tissue extension. We again met with him on 07/05/20, and Brian [name] received palliative radiation to the left iliac lesion.   Brian [name] had remained on Keytruda immunotherapy, but recent restaging PET scan on 01/15/21 showed multi-organ progression of metastatic thyroid carcinoma with an interval increase in size and metabolic activity of a right hepatic lobe metastasis as well as a new lesion in the left hepatic lobe, new upper abdominal hypermetabolic lymphadenopathy, new multifocal hypermetabolic skeletal metastasis and interval enlargement and new hypermetabolism to several pulmonary nodules.     At the time of his recent follow-up with Dr. Marin Olp on 02/03/21, Brian [name] reported mid-thoracic back pain, located right under his rib cage on both sides, as well as right upper quadrant pain and pain in the left iliac crest.  The Keytruda immunotherapy was discontinued due to disease progression.  Brian [name] reports that the pain in the left hip/iliac crest had initially improved after his course of palliative radiation but has returned over the past several weeks. Brian [name] denies any radiation of pain into the lower extremities, paraesthesias or focal weakness. Brian [name] has not had any changes in bowel or bladder function recently and otherwise, is doing fairly well. Unfortunately, [name] is no longer able to do out-of-state tele-health visits so Brian [name] will circle back with Dr. Marin Olp in a couple of weeks to determine next steps in systemic treatment strategy. Brian [name] is  currently on a steroid taper and this has improved his pain significantly.  Brian [name] has kindly been referred back to [place] today for consideration of palliative radiotherapy in the management of his symptomatic bony metastases in the pelvis and thoracic spine.  PREVIOUS RADIATION THERAPY: Yes  07/16/20 - 07/29/20: The target in the left hip/ilium was treated to 30  Gy in 10 fractions.  04/15/20 - 04/26/20:  The liver nodule and portacaval lymph node were treated to a prescription dose of 30 Gy in 10 fractions of 3 Gy each.  PAST MEDICAL HISTORY:  Past Medical History:  Diagnosis Date   Chronic diastolic CHF (congestive heart failure) ([place]) 04/11/2020   DVT, lower extremity, distal, acute, left ([place]) 01/14/2020   Essential hypertension 09/09/2018   Goals of care, counseling/discussion 01/14/2020   Kidney stone    Primary cancer of thyroid with metastasis to other site ([place]) 05/04/2018   Rickettsia infection       PAST SURGICAL HISTORY: Past Surgical History:  Procedure Laterality Date   BIOPSY  04/05/2018   Procedure: BIOPSY;  Surgeon: Ronald Lobo, [name];  Location: [facility];  Service: Endoscopy;;   CHOLECYSTECTOMY     ESOPHAGOGASTRODUODENOSCOPY (EGD) WITH PROPOFOL N/A 04/05/2018   Procedure: ESOPHAGOGASTRODUODENOSCOPY (EGD) WITH PROPOFOL;  Surgeon: Ronald Lobo, [name];  Location: [facility];  Service: Endoscopy;  Laterality: N/A;   IR IMAGING GUIDED PORT INSERTION  09/13/2019   IR RADIOLOGIST EVAL & MGMT  10/11/2018    FAMILY HISTORY:  Family History  Problem Relation Age of Onset   Diabetes Mother    Heart disease Mother    Heart failure Mother    Heart attack Mother    Hyperlipidemia Mother    Hypertension Mother    Hyperlipidemia Father    Allergic rhinitis Father    Rashes / Skin problems Father    Sudden death Neg Hx    Thyroid disease Neg Hx     SOCIAL HISTORY:  Social History   Socioeconomic History   Marital status: Married    Spouse name: Not on file   Number of children: Not on file   Years of education: Not on file   Highest education level: Not on file  Occupational History   Not on file  Tobacco Use   Smoking status: Never   Smokeless tobacco: Never  Vaping Use   Vaping Use: Never used  Substance and Sexual Activity   Alcohol use: No   Drug use: No   Sexual activity: Not on file  Other Topics Concern    Not on file  Social History Narrative   Not on file   Social Determinants of Health   Financial Resource Strain: Not on file  Food Insecurity: Not on file  Transportation Needs: Not on file  Physical Activity: Not on file  Stress: Not on file  Social Connections: Not on file  Intimate Partner Violence: Not on file    ALLERGIES: Dextromethorphan-guaifenesin, Doxycycline, Gadobutrol, Gadolinium derivatives, Guaifenesin, Ibuprofen, Lisinopril, Pantoprazole, Tramadol, Barium, and Chlorhexidine  MEDICATIONS:  Current Outpatient Medications  Medication Sig Dispense Refill   apixaban (ELIQUIS) 5 MG TABS tablet TAKE 1 TABLET (5 MG TOTAL) BY MOUTH 2 (TWO) TIMES DAILY. 60 tablet 6   cyclobenzaprine (FLEXERIL) 10 MG tablet Take 1 tablet (10 mg total) by mouth 3 (three) times daily as needed. 90 tablet 1   diphenhydrAMINE (BENADRYL) 25 mg capsule TAKE 1 CAPSULE BY MOUTH 1 HOUR BEFORE CT SCAN (Patient not taking: Reported on 11/04/2020) 30 capsule 0  EPINEPHrine (EPIPEN 2-PAK) 0.3 mg/0.3 mL IJ SOAJ injection USE AS DIRECTED FOR LIFE THREATENING ALLERGIC REACTIONS (Patient not taking: Reported on 11/04/2020) 4 each 3   famotidine (PEPCID) 40 MG tablet Take 1 tablet (40 mg total) by mouth 2 (two) times daily. 60 tablet 4   HYDROmorphone (DILAUDID) 8 MG tablet Take 1 tablet (8 mg total) by mouth every 4 (four) hours as needed for severe pain. 120 tablet 0   Iron-FA-B Cmp-C-Biot-Probiotic (FUSION PLUS) CAPS Take 1 capsule by mouth daily. 30 capsule 6   levothyroxine (SYNTHROID) 200 MCG tablet Take one tablet (200 mcg dose) by mouth daily. Total:  288 mcg/day. 90 tablet 3   levothyroxine (SYNTHROID) 88 MCG tablet Take one tablet (88 mcg dose) by mouth daily. Total:  288 mcg/day. 90 tablet 3   LORazepam (ATIVAN) 1 MG tablet Take 1 tablet (1 mg total) by mouth every 6 (six) hours as needed for anxiety. 30 tablet 0   methylphenidate (RITALIN) 10 MG tablet Take 1 tablet (10 mg total) by mouth 2 (two) times  daily. 60 tablet 0   metoCLOPramide (REGLAN) 10 MG tablet Take 1 tablet (10 mg total) by mouth every 6 (six) hours as needed for nausea or vomiting. 30 tablet 6   Multiple Vitamin (MULTIVITAMIN) capsule Take 1 capsule by mouth daily.     OLANZapine (ZYPREXA) 10 MG tablet TAKE 1 TABLET (10 MG TOTAL) BY MOUTH AT BEDTIME. 30 tablet 4   oxyCODONE (OXYCONTIN) 30 MG 12 hr tablet Take 1 tablet (30 mg total) by mouth every 8 (eight) hours. 90 tablet 0   polyethylene glycol (MIRALAX / GLYCOLAX) 17 g packet Take 17 g by mouth daily as needed for mild constipation. 14 each 0   predniSONE (DELTASONE) 20 MG tablet Taper: Take 3 tablets (60 mg) by mouth daily for 3 days, then 2 tablets (40 mg) by mouth daily for 5 days and then 1 tablet (20 mg) by mouth daily. 30 tablet 0   promethazine (PHENERGAN) 25 MG tablet TAKE 1 TABLET BY MOUTH EVERY 6 HOURS AS NEEDED FOR NAUSEA & VOMITING 120 tablet 0   senna-docusate (SENOKOT-S) 8.6-50 MG tablet Take 1 tablet by mouth at bedtime as needed for mild constipation. 30 tablet 0   temazepam (RESTORIL) 30 MG capsule Take 1 capsule (30 mg total) by mouth at bedtime as needed for sleep. 30 capsule 0   No current facility-administered medications for this encounter.   Facility-Administered Medications Ordered in Other Encounters  Medication Dose Route Frequency Provider Last Rate Last Admin   HYDROmorphone (DILAUDID) injection 4 mg  4 mg Intravenous Once Ennever, Rudell Cobb, [name]        REVIEW OF SYSTEMS:  On review of systems, the patient reports that Brian [name] is doing well overall. Brian [name] denies any chest pain, shortness of breath, cough, fevers, chills, night sweats, or unintended weight changes. Brian [name] denies any bowel or bladder disturbances, and denies abdominal pain, nausea or vomiting. Brian [name] denies any new musculoskeletal or joint aches or pains aside from that outlined in the HPI above regarding the back and left hip. A complete review of systems is obtained and is otherwise  negative.  PHYSICAL EXAM:  Wt Readings from Last 3 Encounters:  02/03/21 264 lb 1.9 oz (119.8 kg)  01/16/21 266 lb 1.9 oz (120.7 kg)  01/01/21 268 lb (121.6 kg)   Temp Readings from Last 3 Encounters:  02/03/21 98 F (36.7 C) (Oral)  01/16/21 (!) 97.5 F (36.4 C) (Oral)  01/16/21 98.6  F (37 C) (Oral)   BP Readings from Last 3 Encounters:  02/03/21 (!) 132/99  01/16/21 122/78  01/16/21 114/79   Pulse Readings from Last 3 Encounters:  02/03/21 (!) 111  01/16/21 78  01/16/21 (!) 102   Pain Assessment Pain Score: 5  (overall joint pain)/10 Unable to assess due to telephone reconsult visit format.  KPS = 80  100 - Normal; no complaints; no evidence of disease. 90   - Able to carry on normal activity; minor signs or symptoms of disease. 80   - Normal activity with effort; some signs or symptoms of disease. 38   - Cares for self; unable to carry on normal activity or to do active work. 60   - Requires occasional assistance, but is able to care for most of his personal needs. 50   - Requires considerable assistance and frequent medical care. 49   - Disabled; requires special care and assistance. 14   - Severely disabled; hospital admission is indicated although death not imminent. 4   - Very sick; hospital admission necessary; active supportive treatment necessary. 10   - Moribund; fatal processes progressing rapidly. 0     - Dead  Karnofsky DA, Abelmann Luis M. Cintron, Craver LS and Burchenal JH [phone]) The use of the nitrogen mustards in the palliative treatment of carcinoma: with particular reference to bronchogenic carcinoma Cancer 1 634-56  LABORATORY DATA:  Lab Results  Component Value Date   WBC 3.5 (L) 02/03/2021   HGB 11.4 (L) 02/03/2021   HCT 35.5 (L) 02/03/2021   MCV 85.3 02/03/2021   PLT 228 02/03/2021   Lab Results  Component Value Date   NA 137 02/03/2021   K 4.2 02/03/2021   CL 101 02/03/2021   CO2 30 02/03/2021   Lab Results  Component Value Date   ALT 24  02/03/2021   AST 34 02/03/2021   ALKPHOS 116 02/03/2021   BILITOT 0.6 02/03/2021     RADIOGRAPHY: NM PET Image Restag (PS) Skull Base To Thigh  Result Date: 01/16/2021 CLINICAL DATA:  Subsequent treatment strategy for metastatic thyroid carcinoma. EXAM: NUCLEAR MEDICINE PET SKULL BASE TO THIGH TECHNIQUE: 13.3 mCi F-18 FDG was injected intravenously. Full-ring PET imaging was performed from the skull base to thigh after the radiotracer. CT data was obtained and used for attenuation correction and anatomic localization. Fasting blood glucose: 143 mg/dl COMPARISON:  PET-CT scan 09/26/2020 FINDINGS: Mediastinal blood pool activity: SUV max 3.3 Liver activity: SUV max NA NECK: Incidental CT findings: none CHEST: Enlargement of 3 pulmonary nodule which are now hypermetabolic. For example 9 mm RIGHT upper lobe nodule (image 22/8) increased from 7 mm with SUV max equal 7.6. LEFT upper lobe nodule measuring 6 mm is increased from subtle 3 mm nodule now with metabolic activity (SUV max equal 4.6). Incidental CT findings: Port in the anterior chest wall with tip in distal SVC. ABDOMEN/PELVIS: Multiple radiotracer avid lesions are again noted within the liver. Hypermetabolic lesion in the medial RIGHT hepatic lobe is increased in size and intensity with SUV max equal 26 increased from SUV max 7.2. Lesion measures 25 mm on noncontrast exam (image 117/4) increased from 7 mm on prior. Cluster of confluent intensely hypermetabolic lesions in the dome the RIGHT hepatic lobe are also increased in metabolic activity with SUV max equal 26.7 compared SUV max equal 14.6. This confluent mass is expanded to 8.8 cm compared to 5.7 cm. New lesion in the LEFT hepatic lobe measures 17 mm with SUV max equal  17.4 (image 109) Interval enlarged lymph node position between the pancreas and gastric antrum measures 26 mm (120/series 4) with SUV max equal 19.3. This node is new from comparison exam. Small retroperitoneal lymph node the upper  abdomen adjacent to the aorta and IVC measures 11 mm on image 144/4 with SUV max equal 19.1. Incidental CT findings: Bilateral renal calculi. No high-grade obstruction. SKELETON: New hypermetabolic activity within a previously lytic lesion of the LEFT iliac wing with SUV max equal 18.7. New hypermetabolic activity within the LEFT acetabulum with SUV max equal 19.8. No clear CT lesion. Intramedullary lesion in the proximal LEFT femur adjacent to the lesser trochanter with SUV max equal 22. No CT correlation. Incidental CT findings: none IMPRESSION: 1. Multi organ progression of metastatic thyroid carcinoma. 2. Interval increase in size and metabolic activity of RIGHT hepatic lobe hepatic metastasis. New lesion LEFT hepatic lobe. 3. New upper abdominal hypermetabolic lymphadenopathy. 4. New multifocal hypermetabolic skeletal metastasis. 5. Interval enlargement several pulmonary nodules which are now hypermetabolic. These results will be called to the ordering clinician or representative by the Radiologist Assistant, and communication documented in the PACS or [employer]. Electronically Signed   By: Suzy Bouchard M.D.   On: 01/16/2021 12:16      IMPRESSION/PLAN: This visit was conducted via telephone to spare the patient unnecessary potential exposure in the healthcare setting during the current COVID-19 pandemic. 1. 45 y.o. 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 Today, we talked to the patient about the findings and workup thus far. We discussed the natural history of progressive metastatic thyroid carcinoma and general treatment, highlighting the role of palliative radiotherapy in the management of painful osseous metastases. We discussed the available radiation techniques, and focused on the details and logistics of delivery.  Brian [name] has had previous palliative radiation to the left iliac lesion and did get relief initially. The  recommendation is to proceed with a 10 fraction course of daily palliative radiotherapy directed to the painful lesions at T5 and in the left hip, including re-treatment of the left iliac lesion.  We reviewed the anticipated acute and late sequelae associated with radiation in this setting. The patient was encouraged to ask questions that were answered to his stated satisfaction.   At the conclusion of our conversation, the patient is in agreement to proceed with the recommended 2-week course of daily palliative radiotherapy. Brian [name] has given verbal consent to proceed today so we will tentatively plan for CT simulation/treatment planning on Thursday, 02/20/2021 in anticipation of beginning his treatments in the near future.  At the time of simulation, Brian [name] will sign formal written consent to proceed and a copy of this document will be placed in his medical record.  We will share our discussion with Dr. Marin Olp and proceed with treatment planning accordingly.  We enjoyed meeting him today and look forward to continuing to participate in his care.  Given current concerns for patient exposure during the COVID-19 pandemic, this encounter was conducted via telephone. The patient was notified in advance and was offered a [place] meeting to allow for face to face communication but unfortunately reported that Brian [name] did not have the appropriate resources/technology to support such a visit and instead preferred to proceed with telephone consult. The patient has given verbal consent for this type of encounter. The time spent during this encounter was 60 minutes. The attendants for this meeting include [name], Ashlyn Bruning PA-C, and patient, Brian Debono  [name]. During the encounter, Brian [name] and Freeman Caldron PA-C were located at [facility] Radiation Oncology Department.  Patient, BARAKA KLATT was located at home.     Nicholos Johns, PA-C    Brian Pita, [name]  [facility] Direct Dial: [phone]   Fax: [phone] [email or web address]   Skype   [email or web address]   This document serves as a record of services personally performed by Brian Pita, [name] and Freeman Caldron, PA-C. It was created on their behalf by Wilburn Mylar, a trained medical scribe. The creation of this record is based on the scribe's personal observations and the provider's statements to them. This document has been checked and approved by the attending provider.

## 2021-02-18 ENCOUNTER — Other Ambulatory Visit: Payer: Self-pay | Admitting: Hematology & Oncology

## 2021-02-18 ENCOUNTER — Encounter: Payer: Self-pay | Admitting: Family

## 2021-02-18 ENCOUNTER — Encounter: Payer: Self-pay | Admitting: Hematology & Oncology

## 2021-02-18 ENCOUNTER — Other Ambulatory Visit (HOSPITAL_BASED_OUTPATIENT_CLINIC_OR_DEPARTMENT_OTHER): Payer: Self-pay

## 2021-02-18 ENCOUNTER — Other Ambulatory Visit: Payer: Self-pay | Admitting: Family

## 2021-02-18 DIAGNOSIS — R110 Nausea: Secondary | ICD-10-CM

## 2021-02-18 DIAGNOSIS — M546 Pain in thoracic spine: Secondary | ICD-10-CM

## 2021-02-18 DIAGNOSIS — C73 Malignant neoplasm of thyroid gland: Secondary | ICD-10-CM

## 2021-02-18 DIAGNOSIS — C787 Secondary malignant neoplasm of liver and intrahepatic bile duct: Secondary | ICD-10-CM

## 2021-02-18 MED ORDER — LORAZEPAM 1 MG PO TABS
1.0000 mg | ORAL_TABLET | Freq: Four times a day (QID) | ORAL | 0 refills | Status: DC | PRN
  Filled 2021-02-18: qty 30, 8d supply, fill #0

## 2021-02-18 MED ORDER — OXYCONTIN 30 MG PO T12A
30.0000 mg | EXTENDED_RELEASE_TABLET | Freq: Three times a day (TID) | ORAL | 0 refills | Status: DC
  Filled 2021-02-18 – 2021-02-20 (×2): qty 90, 30d supply, fill #0

## 2021-02-18 MED ORDER — TEMAZEPAM 30 MG PO CAPS
30.0000 mg | ORAL_CAPSULE | Freq: Every evening | ORAL | 0 refills | Status: DC | PRN
  Filled 2021-02-18: qty 30, 30d supply, fill #0

## 2021-02-18 MED ORDER — HYDROMORPHONE HCL 8 MG PO TABS
8.0000 mg | ORAL_TABLET | ORAL | 0 refills | Status: DC | PRN
  Filled 2021-02-18 – 2021-03-04 (×4): qty 120, 20d supply, fill #0

## 2021-02-19 ENCOUNTER — Other Ambulatory Visit: Payer: Self-pay | Admitting: Family

## 2021-02-19 DIAGNOSIS — C787 Secondary malignant neoplasm of liver and intrahepatic bile duct: Secondary | ICD-10-CM

## 2021-02-19 DIAGNOSIS — C799 Secondary malignant neoplasm of unspecified site: Secondary | ICD-10-CM

## 2021-02-19 DIAGNOSIS — C73 Malignant neoplasm of thyroid gland: Secondary | ICD-10-CM

## 2021-02-20 ENCOUNTER — Ambulatory Visit
Admission: RE | Admit: 2021-02-20 | Discharge: 2021-02-20 | Disposition: A | Discharge status: Home / Self Care | Payer: BC Managed Care – PPO | Source: Ambulatory Visit | Attending: Radiation Oncology | Admitting: Radiation Oncology

## 2021-02-20 ENCOUNTER — Other Ambulatory Visit: Payer: 59

## 2021-02-20 ENCOUNTER — Other Ambulatory Visit: Payer: Self-pay | Admitting: *Deleted

## 2021-02-20 ENCOUNTER — Other Ambulatory Visit (HOSPITAL_BASED_OUTPATIENT_CLINIC_OR_DEPARTMENT_OTHER): Payer: Self-pay

## 2021-02-20 ENCOUNTER — Inpatient Hospital Stay: Payer: BC Managed Care – PPO

## 2021-02-20 ENCOUNTER — Other Ambulatory Visit: Payer: Self-pay | Admitting: Hematology & Oncology

## 2021-02-20 ENCOUNTER — Inpatient Hospital Stay: Payer: BC Managed Care – PPO | Attending: Hematology & Oncology

## 2021-02-20 ENCOUNTER — Ambulatory Visit: Payer: 59

## 2021-02-20 ENCOUNTER — Encounter: Payer: Self-pay | Admitting: Family

## 2021-02-20 ENCOUNTER — Other Ambulatory Visit: Payer: Self-pay

## 2021-02-20 ENCOUNTER — Encounter: Payer: Self-pay | Admitting: Hematology & Oncology

## 2021-02-20 DIAGNOSIS — Z79899 Other long term (current) drug therapy: Secondary | ICD-10-CM | POA: Diagnosis not present

## 2021-02-20 DIAGNOSIS — C799 Secondary malignant neoplasm of unspecified site: Secondary | ICD-10-CM

## 2021-02-20 DIAGNOSIS — C73 Malignant neoplasm of thyroid gland: Secondary | ICD-10-CM

## 2021-02-20 DIAGNOSIS — Z5111 Encounter for antineoplastic chemotherapy: Secondary | ICD-10-CM | POA: Diagnosis not present

## 2021-02-20 DIAGNOSIS — C7951 Secondary malignant neoplasm of bone: Secondary | ICD-10-CM | POA: Insufficient documentation

## 2021-02-20 DIAGNOSIS — Z510 Encounter for antineoplastic radiation therapy: Secondary | ICD-10-CM | POA: Insufficient documentation

## 2021-02-20 DIAGNOSIS — M546 Pain in thoracic spine: Secondary | ICD-10-CM

## 2021-02-20 DIAGNOSIS — D509 Iron deficiency anemia, unspecified: Secondary | ICD-10-CM

## 2021-02-20 DIAGNOSIS — G893 Neoplasm related pain (acute) (chronic): Secondary | ICD-10-CM | POA: Diagnosis not present

## 2021-02-20 DIAGNOSIS — C787 Secondary malignant neoplasm of liver and intrahepatic bile duct: Secondary | ICD-10-CM

## 2021-02-20 DIAGNOSIS — R000 Tachycardia, unspecified: Secondary | ICD-10-CM | POA: Insufficient documentation

## 2021-02-20 LAB — CMP (CANCER CENTER ONLY)
ALT: 27 U/L (ref 0–44)
AST: 24 U/L (ref 15–41)
Albumin: 4 g/dL (ref 3.5–5.0)
Alkaline Phosphatase: 121 U/L (ref 38–126)
Anion gap: 8 (ref 5–15)
BUN: 19 mg/dL (ref 6–20)
CO2: 28 mmol/L (ref 22–32)
Calcium: 9.5 mg/dL (ref 8.9–10.3)
Chloride: 101 mmol/L (ref 98–111)
Creatinine: 0.84 mg/dL (ref 0.61–1.24)
GFR, Estimated: >60 mL/min (ref >60)
Glucose, Bld: 159 mg/dL — ABNORMAL HIGH (ref 70–99)
Potassium: 4.6 mmol/L (ref 3.5–5.1)
Sodium: 137 mmol/L (ref 135–145)
Total Bilirubin: 0.5 mg/dL (ref 0.3–1.2)
Total Protein: 6.4 g/dL — ABNORMAL LOW (ref 6.5–8.1)

## 2021-02-20 LAB — CBC WITH DIFFERENTIAL (CANCER CENTER ONLY)
Abs Immature Granulocytes: 0.03 10*3/uL (ref 0.00–0.07)
Basophils Absolute: 0 10*3/uL (ref 0.0–0.1)
Basophils Relative: 0 %
Eosinophils Absolute: 0 10*3/uL (ref 0.0–0.5)
Eosinophils Relative: 0 %
HCT: 37 % — ABNORMAL LOW (ref 39.0–52.0)
Hemoglobin: 11.6 g/dL — ABNORMAL LOW (ref 13.0–17.0)
Immature Granulocytes: 0 %
Lymphocytes Relative: 6 %
Lymphs Abs: 0.4 10*3/uL — ABNORMAL LOW (ref 0.7–4.0)
MCH: 27.4 pg (ref 26.0–34.0)
MCHC: 31.4 g/dL (ref 30.0–36.0)
MCV: 87.3 fL (ref 80.0–100.0)
Monocytes Absolute: 0.4 10*3/uL (ref 0.1–1.0)
Monocytes Relative: 6 %
Neutro Abs: 6.5 10*3/uL (ref 1.7–7.7)
Neutrophils Relative %: 88 %
Platelet Count: 203 10*3/uL (ref 150–400)
RBC: 4.24 MIL/uL (ref 4.22–5.81)
RDW: 15.8 % — ABNORMAL HIGH (ref 11.5–15.5)
WBC Count: 7.4 10*3/uL (ref 4.0–10.5)
nRBC: 0 % (ref 0.0–0.2)

## 2021-02-20 MED ORDER — HEPARIN SOD (PORK) LOCK FLUSH 100 UNIT/ML IV SOLN
500.0000 [IU] | Freq: Once | INTRAVENOUS | Status: AC | PRN
  Administered 2021-02-20: 500 [IU]

## 2021-02-20 MED ORDER — SODIUM CHLORIDE 0.9% FLUSH
10.0000 mL | INTRAVENOUS | Status: DC | PRN
  Administered 2021-02-20: 10 mL

## 2021-02-20 MED ORDER — HYDROMORPHONE HCL 4 MG/ML IJ SOLN
4.0000 mg | Freq: Once | INTRAMUSCULAR | Status: AC
  Administered 2021-02-20: 4 mg via INTRAVENOUS
  Filled 2021-02-20: qty 1

## 2021-02-20 MED ORDER — VINBLASTINE SULFATE CHEMO INJECTION 1 MG/ML
6.1700 mg/m2 | Freq: Once | INTRAVENOUS | Status: AC
  Administered 2021-02-20: 15 mg via INTRAVENOUS
  Filled 2021-02-20: qty 15

## 2021-02-20 MED ORDER — SODIUM CHLORIDE 0.9 % IV SOLN: Freq: Once | INTRAVENOUS | Status: AC

## 2021-02-20 MED ORDER — PROCHLORPERAZINE MALEATE 10 MG PO TABS
10.0000 mg | ORAL_TABLET | Freq: Once | ORAL | Status: AC
  Administered 2021-02-20: 10 mg via ORAL
  Filled 2021-02-20: qty 1

## 2021-02-20 MED ORDER — XTAMPZA ER 36 MG PO C12A
36.0000 mg | EXTENDED_RELEASE_CAPSULE | Freq: Two times a day (BID) | ORAL | 0 refills | Status: DC
  Filled 2021-02-20: qty 60, 30d supply, fill #0

## 2021-02-20 MED ORDER — ZOLEDRONIC ACID 4 MG/100ML IV SOLN
4.0000 mg | Freq: Once | INTRAVENOUS | Status: AC
  Administered 2021-02-20: 4 mg via INTRAVENOUS
  Filled 2021-02-20: qty 100

## 2021-02-20 NOTE — Patient Instructions (Signed)
Vinblastine injection What is this medication? VINBLASTINE (vin BLAS teen) is a chemotherapy drug. It slows the growth of cancer cells. This medicine is used to treat many types of cancer like breast cancer, testicular cancer, Hodgkin's disease, non-Hodgkin's lymphoma, and sarcoma. This medicine may be used for other purposes; ask your health care provider or pharmacist if you have questions. COMMON BRAND NAME(S): Velban What should I tell my care team before I take this medication? They need to know if you have any of these conditions: blood disorders dental disease gout infection (especially a virus infection such as chickenpox, cold sores, or herpes) liver disease lung disease nervous system disease recent or ongoing radiation therapy an unusual or allergic reaction to vinblastine, other chemotherapy agents, other medicines, foods, dyes, or preservatives pregnant or trying to get pregnant breast-feeding How should I use this medication? This drug is given as an infusion into a vein. It is administered in a hospital or clinic by a specially trained health care professional. If you have pain, swelling, burning or any unusual feeling around the site of your injection, tell your health care professional right away. Talk to your pediatrician regarding the use of this medicine in children. While this drug may be prescribed for selected conditions, precautions do apply. Overdosage: If you think you have taken too much of this medicine contact a poison control center or emergency room at once. NOTE: This medicine is only for you. Do not share this medicine with others. What if I miss a dose? It is important not to miss your dose. Call your doctor or health care professional if you are unable to keep an appointment. What may interact with this medication? erythromycin certain medicines for fungal infections like itraconazole, ketoconazole, posaconazole, voriconazole certain medicines for  seizures like phenytoin This list may not describe all possible interactions. Give your health care provider a list of all the medicines, herbs, non-prescription drugs, or dietary supplements you use. Also tell them if you smoke, drink alcohol, or use illegal drugs. Some items may interact with your medicine. What should I watch for while using this medication? Your condition will be monitored carefully while you are receiving this medicine. You will need important blood work done while you are taking this medicine. This drug may make you feel generally unwell. This is not uncommon, as chemotherapy can affect healthy cells as well as cancer cells. Report any side effects. Continue your course of treatment even though you feel ill unless your doctor tells you to stop. In some cases, you may be given additional medicines to help with side effects. Follow all directions for their use. Call your doctor or health care professional for advice if you get a fever, chills or sore throat, or other symptoms of a cold or flu. Do not treat yourself. This drug decreases your body's ability to fight infections. Try to avoid being around people who are sick. This medicine may increase your risk to bruise or bleed. Call your doctor or health care professional if you notice any unusual bleeding. Be careful brushing and flossing your teeth or using a toothpick because you may get an infection or bleed more easily. If you have any dental work done, tell your dentist you are receiving this medicine. Avoid taking products that contain aspirin, acetaminophen, ibuprofen, naproxen, or ketoprofen unless instructed by your doctor. These medicines may hide a fever. Do not become pregnant while taking this medicine. Women should inform their doctor if they wish to become pregnant or  think they might be pregnant. There is a potential for serious side effects to an unborn child. Talk to your health care professional or pharmacist for  more information. Do not breast-feed an infant while taking this medicine. Men may have a lower sperm count while taking this medicine. Talk to your doctor if you plan to father a child. What side effects may I notice from receiving this medication? Side effects that you should report to your doctor or health care professional as soon as possible: allergic reactions like skin rash, itching or hives, swelling of the face, lips, or tongue low blood counts - This drug may decrease the number of white blood cells, red blood cells and platelets. You may be at increased risk for infections and bleeding. signs of infection - fever or chills, cough, sore throat, pain or difficulty passing urine signs of decreased platelets or bleeding - bruising, pinpoint red spots on the skin, black, tarry stools, nosebleeds signs of decreased red blood cells - unusually weak or tired, fainting spells, lightheadedness breathing problems changes in hearing change in the amount of urine chest pain high blood pressure mouth sores nausea and vomiting pain, swelling, redness or irritation at the injection site pain, tingling, numbness in the hands or feet problems with balance, dizziness seizures Side effects that usually do not require medical attention (report to your doctor or health care professional if they continue or are bothersome): constipation hair loss jaw pain loss of appetite sensitivity to light stomach pain tumor pain This list may not describe all possible side effects. Call your doctor for medical advice about side effects. You may report side effects to FDA at 1-800-FDA-1088. Where should I keep my medication? This drug is given in a hospital or clinic and will not be stored at home. NOTE: This sheet is a summary. It may not cover all possible information. If you have questions about this medicine, talk to your doctor, pharmacist, or health care provider.  2022 [employer]/Gold Standard  (2020-10-22 00:00:00)

## 2021-02-20 NOTE — Progress Notes (Signed)
°  Radiation Oncology         [phone] ________________________________  Name: Brian [name] MRN: [id number]  Date: 02/20/2021  DOB: Oct 16, 1975  SIMULATION AND TREATMENT PLANNING NOTE    ICD-10-CM   1. Primary cancer of thyroid with metastasis to other site [facility])  C73       DIAGNOSIS:  46 y.o. 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  NARRATIVE:  The patient was brought to the [facility].  Identity was confirmed.  All relevant records and images related to the planned course of therapy were reviewed.  The patient freely provided informed written consent to proceed with treatment after reviewing the details related to the planned course of therapy. The consent form was witnessed and verified by the simulation staff.  Then, the patient was set-up in a stable reproducible  supine position for radiation therapy.  CT images were obtained.  Surface markings were placed.  The CT images were loaded into the planning software.  Then the target and avoidance structures were contoured.  Treatment planning then occurred.  The radiation prescription was entered and confirmed.  Then, I designed and supervised the construction of a total of 3 medically necessary complex treatment devices consisting of leg positioner and MLC apertures to cover the treated hip area.  I have requested : 3D Simulation  I have requested a DVH of the following structures: Rectum, Bladder, femoral heads and target.  PLAN:  The patient will receive 30 Gy in 10 fractions directed to the painful lesions at T5 and in the left hip, including re-treatment of the left iliac lesion.  ________________________________  Sheral Apley Tammi Klippel, M.D.

## 2021-02-21 ENCOUNTER — Other Ambulatory Visit (HOSPITAL_BASED_OUTPATIENT_CLINIC_OR_DEPARTMENT_OTHER): Payer: Self-pay

## 2021-02-21 DIAGNOSIS — Z510 Encounter for antineoplastic radiation therapy: Secondary | ICD-10-CM | POA: Diagnosis not present

## 2021-02-21 DIAGNOSIS — R000 Tachycardia, unspecified: Secondary | ICD-10-CM | POA: Diagnosis not present

## 2021-02-21 DIAGNOSIS — G893 Neoplasm related pain (acute) (chronic): Secondary | ICD-10-CM | POA: Diagnosis not present

## 2021-02-21 DIAGNOSIS — C73 Malignant neoplasm of thyroid gland: Secondary | ICD-10-CM | POA: Diagnosis not present

## 2021-02-21 DIAGNOSIS — Z79899 Other long term (current) drug therapy: Secondary | ICD-10-CM | POA: Diagnosis not present

## 2021-02-21 DIAGNOSIS — C7951 Secondary malignant neoplasm of bone: Secondary | ICD-10-CM | POA: Diagnosis not present

## 2021-02-21 DIAGNOSIS — Z5111 Encounter for antineoplastic chemotherapy: Secondary | ICD-10-CM | POA: Diagnosis not present

## 2021-02-21 DIAGNOSIS — C787 Secondary malignant neoplasm of liver and intrahepatic bile duct: Secondary | ICD-10-CM | POA: Diagnosis not present

## 2021-02-24 ENCOUNTER — Ambulatory Visit
Admission: RE | Admit: 2021-02-24 | Discharge: 2021-02-24 | Disposition: A | Discharge status: Home / Self Care | Payer: BC Managed Care – PPO | Source: Ambulatory Visit | Attending: Radiation Oncology | Admitting: Radiation Oncology

## 2021-02-24 ENCOUNTER — Other Ambulatory Visit: Payer: Self-pay

## 2021-02-24 DIAGNOSIS — R000 Tachycardia, unspecified: Secondary | ICD-10-CM | POA: Diagnosis not present

## 2021-02-24 DIAGNOSIS — C7951 Secondary malignant neoplasm of bone: Secondary | ICD-10-CM | POA: Diagnosis not present

## 2021-02-24 DIAGNOSIS — C787 Secondary malignant neoplasm of liver and intrahepatic bile duct: Secondary | ICD-10-CM | POA: Diagnosis not present

## 2021-02-24 DIAGNOSIS — G893 Neoplasm related pain (acute) (chronic): Secondary | ICD-10-CM | POA: Diagnosis not present

## 2021-02-24 DIAGNOSIS — Z510 Encounter for antineoplastic radiation therapy: Secondary | ICD-10-CM | POA: Diagnosis not present

## 2021-02-24 DIAGNOSIS — Z79899 Other long term (current) drug therapy: Secondary | ICD-10-CM | POA: Diagnosis not present

## 2021-02-24 DIAGNOSIS — C73 Malignant neoplasm of thyroid gland: Secondary | ICD-10-CM | POA: Diagnosis not present

## 2021-02-24 DIAGNOSIS — Z5111 Encounter for antineoplastic chemotherapy: Secondary | ICD-10-CM | POA: Diagnosis not present

## 2021-02-25 ENCOUNTER — Ambulatory Visit
Admission: RE | Admit: 2021-02-25 | Discharge: 2021-02-25 | Disposition: A | Discharge status: Home / Self Care | Payer: BC Managed Care – PPO | Source: Ambulatory Visit | Attending: Radiation Oncology | Admitting: Radiation Oncology

## 2021-02-25 DIAGNOSIS — Z510 Encounter for antineoplastic radiation therapy: Secondary | ICD-10-CM | POA: Diagnosis not present

## 2021-02-25 DIAGNOSIS — C787 Secondary malignant neoplasm of liver and intrahepatic bile duct: Secondary | ICD-10-CM | POA: Diagnosis not present

## 2021-02-25 DIAGNOSIS — R000 Tachycardia, unspecified: Secondary | ICD-10-CM | POA: Diagnosis not present

## 2021-02-25 DIAGNOSIS — C73 Malignant neoplasm of thyroid gland: Secondary | ICD-10-CM | POA: Diagnosis not present

## 2021-02-25 DIAGNOSIS — Z5111 Encounter for antineoplastic chemotherapy: Secondary | ICD-10-CM | POA: Diagnosis not present

## 2021-02-25 DIAGNOSIS — Z79899 Other long term (current) drug therapy: Secondary | ICD-10-CM | POA: Diagnosis not present

## 2021-02-25 DIAGNOSIS — C7951 Secondary malignant neoplasm of bone: Secondary | ICD-10-CM | POA: Diagnosis not present

## 2021-02-25 DIAGNOSIS — G893 Neoplasm related pain (acute) (chronic): Secondary | ICD-10-CM | POA: Diagnosis not present

## 2021-02-26 ENCOUNTER — Other Ambulatory Visit: Payer: Self-pay

## 2021-02-26 ENCOUNTER — Ambulatory Visit
Admission: RE | Admit: 2021-02-26 | Discharge: 2021-02-26 | Disposition: A | Discharge status: Home / Self Care | Payer: BC Managed Care – PPO | Source: Ambulatory Visit | Attending: Radiation Oncology | Admitting: Radiation Oncology

## 2021-02-26 ENCOUNTER — Other Ambulatory Visit (HOSPITAL_BASED_OUTPATIENT_CLINIC_OR_DEPARTMENT_OTHER): Payer: Self-pay

## 2021-02-26 DIAGNOSIS — C787 Secondary malignant neoplasm of liver and intrahepatic bile duct: Secondary | ICD-10-CM | POA: Diagnosis not present

## 2021-02-26 DIAGNOSIS — R000 Tachycardia, unspecified: Secondary | ICD-10-CM | POA: Diagnosis not present

## 2021-02-26 DIAGNOSIS — Z79899 Other long term (current) drug therapy: Secondary | ICD-10-CM | POA: Diagnosis not present

## 2021-02-26 DIAGNOSIS — G893 Neoplasm related pain (acute) (chronic): Secondary | ICD-10-CM | POA: Diagnosis not present

## 2021-02-26 DIAGNOSIS — C7951 Secondary malignant neoplasm of bone: Secondary | ICD-10-CM | POA: Diagnosis not present

## 2021-02-26 DIAGNOSIS — Z5111 Encounter for antineoplastic chemotherapy: Secondary | ICD-10-CM | POA: Diagnosis not present

## 2021-02-26 DIAGNOSIS — C73 Malignant neoplasm of thyroid gland: Secondary | ICD-10-CM | POA: Diagnosis not present

## 2021-02-26 DIAGNOSIS — Z510 Encounter for antineoplastic radiation therapy: Secondary | ICD-10-CM | POA: Diagnosis not present

## 2021-02-27 ENCOUNTER — Ambulatory Visit: Payer: 59

## 2021-02-27 ENCOUNTER — Encounter: Payer: Self-pay | Admitting: *Deleted

## 2021-02-27 ENCOUNTER — Ambulatory Visit: Payer: 59 | Admitting: Hematology & Oncology

## 2021-02-27 ENCOUNTER — Other Ambulatory Visit: Payer: 59

## 2021-02-27 ENCOUNTER — Ambulatory Visit
Admission: RE | Admit: 2021-02-27 | Discharge: 2021-02-27 | Disposition: A | Discharge status: Home / Self Care | Payer: BC Managed Care – PPO | Source: Ambulatory Visit | Attending: Radiation Oncology | Admitting: Radiation Oncology

## 2021-02-27 ENCOUNTER — Inpatient Hospital Stay: Payer: BC Managed Care – PPO

## 2021-02-27 VITALS — BP 126/89 | HR 100 | Temp 98.5°F | Resp 193

## 2021-02-27 DIAGNOSIS — C787 Secondary malignant neoplasm of liver and intrahepatic bile duct: Secondary | ICD-10-CM | POA: Diagnosis not present

## 2021-02-27 DIAGNOSIS — C73 Malignant neoplasm of thyroid gland: Secondary | ICD-10-CM | POA: Diagnosis not present

## 2021-02-27 DIAGNOSIS — Z510 Encounter for antineoplastic radiation therapy: Secondary | ICD-10-CM | POA: Diagnosis not present

## 2021-02-27 DIAGNOSIS — Z79899 Other long term (current) drug therapy: Secondary | ICD-10-CM | POA: Diagnosis not present

## 2021-02-27 DIAGNOSIS — R110 Nausea: Secondary | ICD-10-CM

## 2021-02-27 DIAGNOSIS — M546 Pain in thoracic spine: Secondary | ICD-10-CM

## 2021-02-27 DIAGNOSIS — C7951 Secondary malignant neoplasm of bone: Secondary | ICD-10-CM | POA: Diagnosis not present

## 2021-02-27 DIAGNOSIS — R000 Tachycardia, unspecified: Secondary | ICD-10-CM | POA: Diagnosis not present

## 2021-02-27 DIAGNOSIS — C799 Secondary malignant neoplasm of unspecified site: Secondary | ICD-10-CM

## 2021-02-27 DIAGNOSIS — Z5111 Encounter for antineoplastic chemotherapy: Secondary | ICD-10-CM | POA: Diagnosis not present

## 2021-02-27 DIAGNOSIS — G893 Neoplasm related pain (acute) (chronic): Secondary | ICD-10-CM | POA: Diagnosis not present

## 2021-02-27 LAB — CBC WITH DIFFERENTIAL (CANCER CENTER ONLY)
Abs Immature Granulocytes: 0.01 10*3/uL (ref 0.00–0.07)
Basophils Absolute: 0 10*3/uL (ref 0.0–0.1)
Basophils Relative: 1 %
Eosinophils Absolute: 0.1 10*3/uL (ref 0.0–0.5)
Eosinophils Relative: 4 %
HCT: 34.3 % — ABNORMAL LOW (ref 39.0–52.0)
Hemoglobin: 11 g/dL — ABNORMAL LOW (ref 13.0–17.0)
Immature Granulocytes: 0 %
Lymphocytes Relative: 17 %
Lymphs Abs: 0.5 10*3/uL — ABNORMAL LOW (ref 0.7–4.0)
MCH: 27.4 pg (ref 26.0–34.0)
MCHC: 32.1 g/dL (ref 30.0–36.0)
MCV: 85.3 fL (ref 80.0–100.0)
Monocytes Absolute: 0.1 10*3/uL (ref 0.1–1.0)
Monocytes Relative: 5 %
Neutro Abs: 1.9 10*3/uL (ref 1.7–7.7)
Neutrophils Relative %: 73 %
Platelet Count: 194 10*3/uL (ref 150–400)
RBC: 4.02 MIL/uL — ABNORMAL LOW (ref 4.22–5.81)
RDW: 14.8 % (ref 11.5–15.5)
WBC Count: 2.6 10*3/uL — ABNORMAL LOW (ref 4.0–10.5)
nRBC: 0 % (ref 0.0–0.2)

## 2021-02-27 LAB — CMP (CANCER CENTER ONLY)
ALT: 20 U/L (ref 0–44)
AST: 21 U/L (ref 15–41)
Albumin: 3.8 g/dL (ref 3.5–5.0)
Alkaline Phosphatase: 115 U/L (ref 38–126)
Anion gap: 7 (ref 5–15)
BUN: 14 mg/dL (ref 6–20)
CO2: 27 mmol/L (ref 22–32)
Calcium: 9 mg/dL (ref 8.9–10.3)
Chloride: 104 mmol/L (ref 98–111)
Creatinine: 0.73 mg/dL (ref 0.61–1.24)
GFR, Estimated: >60 mL/min (ref >60)
Glucose, Bld: 125 mg/dL — ABNORMAL HIGH (ref 70–99)
Potassium: 4 mmol/L (ref 3.5–5.1)
Sodium: 138 mmol/L (ref 135–145)
Total Bilirubin: 0.4 mg/dL (ref 0.3–1.2)
Total Protein: 5.7 g/dL — ABNORMAL LOW (ref 6.5–8.1)

## 2021-02-27 LAB — LACTATE DEHYDROGENASE: LDH: 313 U/L — ABNORMAL HIGH (ref 98–192)

## 2021-02-27 MED ORDER — HYDROMORPHONE HCL 4 MG/ML IJ SOLN
4.0000 mg | Freq: Once | INTRAMUSCULAR | Status: AC
  Administered 2021-02-27: 4 mg via INTRAVENOUS
  Filled 2021-02-27: qty 1

## 2021-02-27 MED ORDER — SODIUM CHLORIDE 0.9 % IV SOLN: Freq: Once | INTRAVENOUS | Status: AC

## 2021-02-27 MED ORDER — VINBLASTINE SULFATE CHEMO INJECTION 1 MG/ML
6.1700 mg/m2 | Freq: Once | INTRAVENOUS | Status: AC
  Administered 2021-02-27: 15 mg via INTRAVENOUS
  Filled 2021-02-27: qty 15

## 2021-02-27 MED ORDER — HEPARIN SOD (PORK) LOCK FLUSH 100 UNIT/ML IV SOLN
500.0000 [IU] | Freq: Once | INTRAVENOUS | Status: AC | PRN
  Administered 2021-02-27: 500 [IU]

## 2021-02-27 MED ORDER — SODIUM CHLORIDE 0.9% FLUSH
10.0000 mL | INTRAVENOUS | Status: DC | PRN
  Administered 2021-02-27: 10 mL

## 2021-02-27 MED ORDER — PROCHLORPERAZINE MALEATE 10 MG PO TABS
10.0000 mg | ORAL_TABLET | Freq: Once | ORAL | Status: AC
  Administered 2021-02-27: 10 mg via ORAL
  Filled 2021-02-27: qty 1

## 2021-02-27 NOTE — Patient Instructions (Signed)
[facility] [facility] [place]  Discharge Instructions: Thank you for choosing [facility] to provide your oncology and hematology care.   If you have a lab appointment with the [facility], please go directly to the [facility] and check in at the registration area.  Wear comfortable clothing and clothing appropriate for easy access to any Portacath or PICC line.   We strive to give you quality time with your provider. You may need to reschedule your appointment if you arrive late (15 or more minutes).  Arriving late affects you and other patients whose appointments are after yours.  Also, if you miss three or more appointments without notifying the office, you may be dismissed from the clinic at the providers discretion.      For prescription refill requests, have your pharmacy contact our office and allow 72 hours for refills to be completed.    Today you received the following chemotherapy and/or immunotherapy agents Vinblastine      To help prevent nausea and vomiting after your treatment, we encourage you to take your nausea medication as directed.  BELOW ARE SYMPTOMS THAT SHOULD BE REPORTED IMMEDIATELY: *FEVER GREATER THAN 100.4 F (38 C) OR HIGHER *CHILLS OR SWEATING *NAUSEA AND VOMITING THAT IS NOT CONTROLLED WITH YOUR NAUSEA MEDICATION *UNUSUAL SHORTNESS OF BREATH *UNUSUAL BRUISING OR BLEEDING *URINARY PROBLEMS (pain or burning when urinating, or frequent urination) *BOWEL PROBLEMS (unusual diarrhea, constipation, pain near the anus) TENDERNESS IN MOUTH AND THROAT WITH OR WITHOUT PRESENCE OF ULCERS (sore throat, sores in mouth, or a toothache) UNUSUAL RASH, SWELLING OR PAIN  UNUSUAL VAGINAL DISCHARGE OR ITCHING   Items with * indicate a potential emergency and should be followed up as soon as possible or go to the Emergency Department if any problems should occur.  Please show the CHEMOTHERAPY ALERT CARD or IMMUNOTHERAPY ALERT CARD at check-in to the  Emergency Department and triage nurse. Should you have questions after your visit or need to cancel or reschedule your appointment, please contact [facility]  [phone] and follow the prompts.  Office hours are 8:00 a.m. to 4:30 p.m. Monday - Friday. Please note that voicemails left after 4:00 p.m. may not be returned until the following business day.  We are closed weekends and major holidays. You have access to a nurse at all times for urgent questions. Please call the main number to the clinic [phone] and follow the prompts.  For any non-urgent questions, you may also contact your provider using MyChart. We now offer e-Visits for anyone 47 and older to request care online for non-urgent symptoms. For details visit [email or web address].   Also download the MyChart app! Go to the app store, search "MyChart", open the app, select [facility], and log in with your MyChart username and password.  Due to Covid, a mask is required upon entering the hospital/clinic. If you do not have a mask, one will be given to you upon arrival. For doctor visits, patients may have 1 support person aged 68 or older with them. For treatment visits, patients cannot have anyone with them due to current Covid guidelines and our immunocompromised population.

## 2021-02-27 NOTE — Patient Instructions (Signed)

## 2021-02-28 ENCOUNTER — Other Ambulatory Visit: Payer: Self-pay

## 2021-02-28 ENCOUNTER — Ambulatory Visit
Admission: RE | Admit: 2021-02-28 | Discharge: 2021-02-28 | Disposition: A | Discharge status: Home / Self Care | Payer: BC Managed Care – PPO | Source: Ambulatory Visit | Attending: Radiation Oncology | Admitting: Radiation Oncology

## 2021-02-28 DIAGNOSIS — C787 Secondary malignant neoplasm of liver and intrahepatic bile duct: Secondary | ICD-10-CM | POA: Diagnosis not present

## 2021-02-28 DIAGNOSIS — Z510 Encounter for antineoplastic radiation therapy: Secondary | ICD-10-CM | POA: Diagnosis not present

## 2021-02-28 DIAGNOSIS — Z5111 Encounter for antineoplastic chemotherapy: Secondary | ICD-10-CM | POA: Diagnosis not present

## 2021-02-28 DIAGNOSIS — C7951 Secondary malignant neoplasm of bone: Secondary | ICD-10-CM | POA: Diagnosis not present

## 2021-02-28 DIAGNOSIS — G893 Neoplasm related pain (acute) (chronic): Secondary | ICD-10-CM | POA: Diagnosis not present

## 2021-02-28 DIAGNOSIS — R000 Tachycardia, unspecified: Secondary | ICD-10-CM | POA: Diagnosis not present

## 2021-02-28 DIAGNOSIS — Z79899 Other long term (current) drug therapy: Secondary | ICD-10-CM | POA: Diagnosis not present

## 2021-02-28 DIAGNOSIS — C73 Malignant neoplasm of thyroid gland: Secondary | ICD-10-CM | POA: Diagnosis not present

## 2021-03-03 ENCOUNTER — Other Ambulatory Visit: Payer: Self-pay

## 2021-03-03 ENCOUNTER — Ambulatory Visit
Admission: RE | Admit: 2021-03-03 | Discharge: 2021-03-03 | Disposition: A | Discharge status: Home / Self Care | Payer: BC Managed Care – PPO | Source: Ambulatory Visit | Attending: Radiation Oncology | Admitting: Radiation Oncology

## 2021-03-03 DIAGNOSIS — Z5111 Encounter for antineoplastic chemotherapy: Secondary | ICD-10-CM | POA: Diagnosis not present

## 2021-03-03 DIAGNOSIS — Z79899 Other long term (current) drug therapy: Secondary | ICD-10-CM | POA: Diagnosis not present

## 2021-03-03 DIAGNOSIS — Z510 Encounter for antineoplastic radiation therapy: Secondary | ICD-10-CM | POA: Diagnosis not present

## 2021-03-03 DIAGNOSIS — G893 Neoplasm related pain (acute) (chronic): Secondary | ICD-10-CM | POA: Diagnosis not present

## 2021-03-03 DIAGNOSIS — C787 Secondary malignant neoplasm of liver and intrahepatic bile duct: Secondary | ICD-10-CM | POA: Diagnosis not present

## 2021-03-03 DIAGNOSIS — C73 Malignant neoplasm of thyroid gland: Secondary | ICD-10-CM | POA: Diagnosis not present

## 2021-03-03 DIAGNOSIS — R000 Tachycardia, unspecified: Secondary | ICD-10-CM | POA: Diagnosis not present

## 2021-03-03 DIAGNOSIS — C7951 Secondary malignant neoplasm of bone: Secondary | ICD-10-CM | POA: Diagnosis not present

## 2021-03-04 ENCOUNTER — Other Ambulatory Visit: Payer: Self-pay | Admitting: Hematology & Oncology

## 2021-03-04 ENCOUNTER — Ambulatory Visit
Admission: RE | Admit: 2021-03-04 | Discharge: 2021-03-04 | Disposition: A | Discharge status: Home / Self Care | Payer: BC Managed Care – PPO | Source: Ambulatory Visit | Attending: Radiation Oncology | Admitting: Radiation Oncology

## 2021-03-04 ENCOUNTER — Other Ambulatory Visit (HOSPITAL_BASED_OUTPATIENT_CLINIC_OR_DEPARTMENT_OTHER): Payer: Self-pay

## 2021-03-04 DIAGNOSIS — C7951 Secondary malignant neoplasm of bone: Secondary | ICD-10-CM | POA: Diagnosis not present

## 2021-03-04 DIAGNOSIS — C787 Secondary malignant neoplasm of liver and intrahepatic bile duct: Secondary | ICD-10-CM | POA: Diagnosis not present

## 2021-03-04 DIAGNOSIS — Z5111 Encounter for antineoplastic chemotherapy: Secondary | ICD-10-CM | POA: Diagnosis not present

## 2021-03-04 DIAGNOSIS — Z510 Encounter for antineoplastic radiation therapy: Secondary | ICD-10-CM | POA: Diagnosis not present

## 2021-03-04 DIAGNOSIS — G893 Neoplasm related pain (acute) (chronic): Secondary | ICD-10-CM | POA: Diagnosis not present

## 2021-03-04 DIAGNOSIS — C799 Secondary malignant neoplasm of unspecified site: Secondary | ICD-10-CM

## 2021-03-04 DIAGNOSIS — Z79899 Other long term (current) drug therapy: Secondary | ICD-10-CM | POA: Diagnosis not present

## 2021-03-04 DIAGNOSIS — R110 Nausea: Secondary | ICD-10-CM

## 2021-03-04 DIAGNOSIS — C73 Malignant neoplasm of thyroid gland: Secondary | ICD-10-CM

## 2021-03-04 DIAGNOSIS — R000 Tachycardia, unspecified: Secondary | ICD-10-CM | POA: Diagnosis not present

## 2021-03-04 MED ORDER — LORAZEPAM 1 MG PO TABS
1.0000 mg | ORAL_TABLET | Freq: Four times a day (QID) | ORAL | 0 refills | Status: DC | PRN
  Filled 2021-03-04: qty 30, 8d supply, fill #0

## 2021-03-05 ENCOUNTER — Other Ambulatory Visit (HOSPITAL_BASED_OUTPATIENT_CLINIC_OR_DEPARTMENT_OTHER): Payer: Self-pay

## 2021-03-05 ENCOUNTER — Ambulatory Visit
Admission: RE | Admit: 2021-03-05 | Discharge: 2021-03-05 | Disposition: A | Discharge status: Home / Self Care | Payer: BC Managed Care – PPO | Source: Ambulatory Visit | Attending: Radiation Oncology | Admitting: Radiation Oncology

## 2021-03-05 DIAGNOSIS — Z510 Encounter for antineoplastic radiation therapy: Secondary | ICD-10-CM | POA: Diagnosis not present

## 2021-03-05 DIAGNOSIS — C787 Secondary malignant neoplasm of liver and intrahepatic bile duct: Secondary | ICD-10-CM | POA: Diagnosis not present

## 2021-03-05 DIAGNOSIS — G893 Neoplasm related pain (acute) (chronic): Secondary | ICD-10-CM | POA: Diagnosis not present

## 2021-03-05 DIAGNOSIS — C7951 Secondary malignant neoplasm of bone: Secondary | ICD-10-CM | POA: Diagnosis not present

## 2021-03-05 DIAGNOSIS — C73 Malignant neoplasm of thyroid gland: Secondary | ICD-10-CM | POA: Diagnosis not present

## 2021-03-05 DIAGNOSIS — Z79899 Other long term (current) drug therapy: Secondary | ICD-10-CM | POA: Diagnosis not present

## 2021-03-05 DIAGNOSIS — Z5111 Encounter for antineoplastic chemotherapy: Secondary | ICD-10-CM | POA: Diagnosis not present

## 2021-03-05 DIAGNOSIS — R000 Tachycardia, unspecified: Secondary | ICD-10-CM | POA: Diagnosis not present

## 2021-03-06 ENCOUNTER — Ambulatory Visit
Admission: RE | Admit: 2021-03-06 | Discharge: 2021-03-06 | Disposition: A | Discharge status: Home / Self Care | Payer: BC Managed Care – PPO | Source: Ambulatory Visit | Attending: Radiation Oncology | Admitting: Radiation Oncology

## 2021-03-06 ENCOUNTER — Other Ambulatory Visit: Payer: Self-pay

## 2021-03-06 DIAGNOSIS — C7951 Secondary malignant neoplasm of bone: Secondary | ICD-10-CM | POA: Diagnosis not present

## 2021-03-06 DIAGNOSIS — Z510 Encounter for antineoplastic radiation therapy: Secondary | ICD-10-CM | POA: Diagnosis not present

## 2021-03-06 DIAGNOSIS — G893 Neoplasm related pain (acute) (chronic): Secondary | ICD-10-CM | POA: Diagnosis not present

## 2021-03-06 DIAGNOSIS — Z5111 Encounter for antineoplastic chemotherapy: Secondary | ICD-10-CM | POA: Diagnosis not present

## 2021-03-06 DIAGNOSIS — Z79899 Other long term (current) drug therapy: Secondary | ICD-10-CM | POA: Diagnosis not present

## 2021-03-06 DIAGNOSIS — R000 Tachycardia, unspecified: Secondary | ICD-10-CM | POA: Diagnosis not present

## 2021-03-06 DIAGNOSIS — C73 Malignant neoplasm of thyroid gland: Secondary | ICD-10-CM | POA: Diagnosis not present

## 2021-03-06 DIAGNOSIS — C787 Secondary malignant neoplasm of liver and intrahepatic bile duct: Secondary | ICD-10-CM | POA: Diagnosis not present

## 2021-03-07 ENCOUNTER — Ambulatory Visit
Admission: RE | Admit: 2021-03-07 | Discharge: 2021-03-07 | Disposition: A | Discharge status: Home / Self Care | Payer: BC Managed Care – PPO | Source: Ambulatory Visit | Attending: Radiation Oncology | Admitting: Radiation Oncology

## 2021-03-07 ENCOUNTER — Encounter: Payer: Self-pay | Admitting: Radiation Oncology

## 2021-03-07 DIAGNOSIS — R000 Tachycardia, unspecified: Secondary | ICD-10-CM | POA: Diagnosis not present

## 2021-03-07 DIAGNOSIS — C73 Malignant neoplasm of thyroid gland: Secondary | ICD-10-CM | POA: Diagnosis not present

## 2021-03-07 DIAGNOSIS — C7951 Secondary malignant neoplasm of bone: Secondary | ICD-10-CM | POA: Diagnosis not present

## 2021-03-07 DIAGNOSIS — G893 Neoplasm related pain (acute) (chronic): Secondary | ICD-10-CM | POA: Diagnosis not present

## 2021-03-07 DIAGNOSIS — C787 Secondary malignant neoplasm of liver and intrahepatic bile duct: Secondary | ICD-10-CM | POA: Diagnosis not present

## 2021-03-07 DIAGNOSIS — Z5111 Encounter for antineoplastic chemotherapy: Secondary | ICD-10-CM | POA: Diagnosis not present

## 2021-03-07 DIAGNOSIS — Z79899 Other long term (current) drug therapy: Secondary | ICD-10-CM | POA: Diagnosis not present

## 2021-03-07 DIAGNOSIS — Z510 Encounter for antineoplastic radiation therapy: Secondary | ICD-10-CM | POA: Diagnosis not present

## 2021-03-13 ENCOUNTER — Inpatient Hospital Stay: Payer: BC Managed Care – PPO

## 2021-03-13 ENCOUNTER — Inpatient Hospital Stay (HOSPITAL_BASED_OUTPATIENT_CLINIC_OR_DEPARTMENT_OTHER): Payer: BC Managed Care – PPO | Admitting: Hematology & Oncology

## 2021-03-13 ENCOUNTER — Other Ambulatory Visit: Payer: Self-pay

## 2021-03-13 ENCOUNTER — Other Ambulatory Visit (HOSPITAL_BASED_OUTPATIENT_CLINIC_OR_DEPARTMENT_OTHER): Payer: Self-pay

## 2021-03-13 ENCOUNTER — Encounter: Payer: Self-pay | Admitting: Hematology & Oncology

## 2021-03-13 VITALS — BP 124/90 | HR 143 | Temp 99.9°F | Resp 20 | Ht 70.0 in | Wt 265.0 lb

## 2021-03-13 VITALS — BP 132/95 | HR 111 | Temp 98.1°F | Resp 19

## 2021-03-13 DIAGNOSIS — M546 Pain in thoracic spine: Secondary | ICD-10-CM

## 2021-03-13 DIAGNOSIS — C73 Malignant neoplasm of thyroid gland: Secondary | ICD-10-CM

## 2021-03-13 DIAGNOSIS — Z79899 Other long term (current) drug therapy: Secondary | ICD-10-CM | POA: Diagnosis not present

## 2021-03-13 DIAGNOSIS — Z510 Encounter for antineoplastic radiation therapy: Secondary | ICD-10-CM | POA: Diagnosis not present

## 2021-03-13 DIAGNOSIS — C787 Secondary malignant neoplasm of liver and intrahepatic bile duct: Secondary | ICD-10-CM | POA: Diagnosis not present

## 2021-03-13 DIAGNOSIS — R000 Tachycardia, unspecified: Secondary | ICD-10-CM

## 2021-03-13 DIAGNOSIS — D509 Iron deficiency anemia, unspecified: Secondary | ICD-10-CM

## 2021-03-13 DIAGNOSIS — Z5111 Encounter for antineoplastic chemotherapy: Secondary | ICD-10-CM | POA: Diagnosis not present

## 2021-03-13 DIAGNOSIS — C7951 Secondary malignant neoplasm of bone: Secondary | ICD-10-CM | POA: Diagnosis not present

## 2021-03-13 DIAGNOSIS — C799 Secondary malignant neoplasm of unspecified site: Secondary | ICD-10-CM

## 2021-03-13 LAB — CBC WITH DIFFERENTIAL (CANCER CENTER ONLY)
Abs Immature Granulocytes: 0.02 10*3/uL (ref 0.00–0.07)
Basophils Absolute: 0 10*3/uL (ref 0.0–0.1)
Basophils Relative: 1 %
Eosinophils Absolute: 0 10*3/uL (ref 0.0–0.5)
Eosinophils Relative: 1 %
HCT: 32.9 % — ABNORMAL LOW (ref 39.0–52.0)
Hemoglobin: 10.5 g/dL — ABNORMAL LOW (ref 13.0–17.0)
Immature Granulocytes: 1 %
Lymphocytes Relative: 11 %
Lymphs Abs: 0.3 10*3/uL — ABNORMAL LOW (ref 0.7–4.0)
MCH: 27.4 pg (ref 26.0–34.0)
MCHC: 31.9 g/dL (ref 30.0–36.0)
MCV: 85.9 fL (ref 80.0–100.0)
Monocytes Absolute: 1 10*3/uL (ref 0.1–1.0)
Monocytes Relative: 34 %
Neutro Abs: 1.6 10*3/uL — ABNORMAL LOW (ref 1.7–7.7)
Neutrophils Relative %: 52 %
Platelet Count: 219 10*3/uL (ref 150–400)
RBC: 3.83 MIL/uL — ABNORMAL LOW (ref 4.22–5.81)
RDW: 17.2 % — ABNORMAL HIGH (ref 11.5–15.5)
WBC Count: 3 10*3/uL — ABNORMAL LOW (ref 4.0–10.5)
nRBC: 0 % (ref 0.0–0.2)

## 2021-03-13 LAB — CMP (CANCER CENTER ONLY)
ALT: 17 U/L (ref 0–44)
AST: 23 U/L (ref 15–41)
Albumin: 4 g/dL (ref 3.5–5.0)
Alkaline Phosphatase: 127 U/L — ABNORMAL HIGH (ref 38–126)
Anion gap: 7 (ref 5–15)
BUN: 16 mg/dL (ref 6–20)
CO2: 29 mmol/L (ref 22–32)
Calcium: 9.7 mg/dL (ref 8.9–10.3)
Chloride: 97 mmol/L — ABNORMAL LOW (ref 98–111)
Creatinine: 0.91 mg/dL (ref 0.61–1.24)
GFR, Estimated: >60 mL/min (ref >60)
Glucose, Bld: 139 mg/dL — ABNORMAL HIGH (ref 70–99)
Potassium: 3.7 mmol/L (ref 3.5–5.1)
Sodium: 133 mmol/L — ABNORMAL LOW (ref 135–145)
Total Bilirubin: 0.7 mg/dL (ref 0.3–1.2)
Total Protein: 6.4 g/dL — ABNORMAL LOW (ref 6.5–8.1)

## 2021-03-13 LAB — LACTATE DEHYDROGENASE: LDH: 243 U/L — ABNORMAL HIGH (ref 98–192)

## 2021-03-13 MED ORDER — VINBLASTINE SULFATE CHEMO INJECTION 1 MG/ML
6.1700 mg/m2 | Freq: Once | INTRAVENOUS | Status: AC
  Administered 2021-03-13: 15 mg via INTRAVENOUS
  Filled 2021-03-13: qty 15

## 2021-03-13 MED ORDER — PROCHLORPERAZINE MALEATE 10 MG PO TABS
10.0000 mg | ORAL_TABLET | Freq: Once | ORAL | Status: AC
  Administered 2021-03-13: 10 mg via ORAL
  Filled 2021-03-13: qty 1

## 2021-03-13 MED ORDER — SODIUM CHLORIDE 0.9% FLUSH
10.0000 mL | Freq: Once | INTRAVENOUS | Status: AC | PRN
  Administered 2021-03-13: 10 mL

## 2021-03-13 MED ORDER — FAMOTIDINE 40 MG PO TABS
40.0000 mg | ORAL_TABLET | Freq: Two times a day (BID) | ORAL | 4 refills | Status: DC
  Filled 2021-03-13: qty 60, 30d supply, fill #0
  Filled 2021-04-28: qty 60, 30d supply, fill #1
  Filled 2021-05-07 – 2021-06-06 (×2): qty 60, 30d supply, fill #2
  Filled 2021-07-11: qty 60, 30d supply, fill #3
  Filled 2021-07-21: qty 60, 30d supply, fill #4

## 2021-03-13 MED ORDER — METOPROLOL TARTRATE 50 MG PO TABS
50.0000 mg | ORAL_TABLET | Freq: Two times a day (BID) | ORAL | 4 refills | Status: DC
  Filled 2021-03-13: qty 60, 30d supply, fill #0
  Filled 2021-04-28: qty 60, 30d supply, fill #1
  Filled 2021-06-06: qty 60, 30d supply, fill #2
  Filled 2021-07-21: qty 60, 30d supply, fill #3
  Filled 2021-08-18: qty 60, 30d supply, fill #4

## 2021-03-13 MED ORDER — POTASSIUM CHLORIDE CRYS ER 20 MEQ PO TBCR
40.0000 meq | EXTENDED_RELEASE_TABLET | Freq: Once | ORAL | Status: AC
  Administered 2021-03-13: 40 meq via ORAL
  Filled 2021-03-13: qty 2

## 2021-03-13 MED ORDER — TEMAZEPAM 30 MG PO CAPS
30.0000 mg | ORAL_CAPSULE | Freq: Every evening | ORAL | 0 refills | Status: DC | PRN
  Filled 2021-03-13: qty 30, 30d supply, fill #0
  Filled 2021-03-20: qty 15, 15d supply, fill #0
  Filled 2021-04-02 – 2021-04-10 (×2): qty 15, 15d supply, fill #1

## 2021-03-13 MED ORDER — HYDROMORPHONE HCL 4 MG/ML IJ SOLN
8.0000 mg | Freq: Once | INTRAMUSCULAR | Status: AC
  Administered 2021-03-13: 8 mg via INTRAVENOUS
  Filled 2021-03-13: qty 2

## 2021-03-13 MED ORDER — FENTANYL 50 MCG/HR TD PT72
1.0000 | MEDICATED_PATCH | TRANSDERMAL | 0 refills | Status: DC
  Filled 2021-03-13: qty 10, 30d supply, fill #0

## 2021-03-13 MED ORDER — POTASSIUM CHLORIDE CRYS ER 20 MEQ PO TBCR
20.0000 meq | EXTENDED_RELEASE_TABLET | Freq: Every day | ORAL | 5 refills | Status: DC
  Filled 2021-03-13: qty 30, 30d supply, fill #0
  Filled 2021-04-28: qty 30, 30d supply, fill #1
  Filled 2021-06-25: qty 30, 30d supply, fill #2
  Filled 2021-08-04: qty 30, 30d supply, fill #3
  Filled 2021-09-08: qty 30, 30d supply, fill #4
  Filled 2021-09-22 – 2021-10-01 (×2): qty 30, 30d supply, fill #5

## 2021-03-13 MED ORDER — HEPARIN SOD (PORK) LOCK FLUSH 100 UNIT/ML IV SOLN
500.0000 [IU] | Freq: Once | INTRAVENOUS | Status: AC | PRN
  Administered 2021-03-13: 500 [IU]

## 2021-03-13 MED ORDER — SODIUM CHLORIDE 0.9 % IV SOLN: Freq: Once | INTRAVENOUS | Status: AC

## 2021-03-13 NOTE — Patient Instructions (Signed)

## 2021-03-13 NOTE — Telephone Encounter (Signed)
Pt in office today requesting a refill on his Temazepam (last refilled 02/18/21 # 30) and Pepcid (last refilled 04/22/20 # 30 with 4 refills) prescriptions. Please advise for refills, thanks.

## 2021-03-13 NOTE — Patient Instructions (Addendum)
[facility] [place]  Discharge Instructions: Thank you for choosing [facility] to provide your oncology and hematology care.   If you have a lab appointment with the [facility], please go directly to the [facility] and check in at the registration area.  Wear comfortable clothing and clothing appropriate for easy access to any Portacath or PICC line.   We strive to give you quality time with your provider. You may need to reschedule your appointment if you arrive late (15 or more minutes).  Arriving late affects you and other patients whose appointments are after yours.  Also, if you miss three or more appointments without notifying the office, you may be dismissed from the clinic at the providers discretion.      For prescription refill requests, have your pharmacy contact our office and allow 72 hours for refills to be completed.    Today you received the following chemotherapy and/or immunotherapy agents velban, compazine, potassium, dilaudid 8 mg   To help prevent nausea and vomiting after your treatment, we encourage you to take your nausea medication as directed.  BELOW ARE SYMPTOMS THAT SHOULD BE REPORTED IMMEDIATELY: *FEVER GREATER THAN 100.4 F (38 C) OR HIGHER *CHILLS OR SWEATING *NAUSEA AND VOMITING THAT IS NOT CONTROLLED WITH YOUR NAUSEA MEDICATION *UNUSUAL SHORTNESS OF BREATH *UNUSUAL BRUISING OR BLEEDING *URINARY PROBLEMS (pain or burning when urinating, or frequent urination) *BOWEL PROBLEMS (unusual diarrhea, constipation, pain near the anus) TENDERNESS IN MOUTH AND THROAT WITH OR WITHOUT PRESENCE OF ULCERS (sore throat, sores in mouth, or a toothache) UNUSUAL RASH, SWELLING OR PAIN  UNUSUAL VAGINAL DISCHARGE OR ITCHING   Items with * indicate a potential emergency and should be followed up as soon as possible or go to the Emergency Department if any problems should occur.  Please show the CHEMOTHERAPY ALERT CARD or IMMUNOTHERAPY  ALERT CARD at check-in to the Emergency Department and triage nurse. Should you have questions after your visit or need to cancel or reschedule your appointment, please contact [facility]  [phone] and follow the prompts.  Office hours are 8:00 a.m. to 4:30 p.m. Monday - Friday. Please note that voicemails left after 4:00 p.m. may not be returned until the following business day.  We are closed weekends and major holidays. You have access to a nurse at all times for urgent questions. Please call the main number to the clinic [phone] and follow the prompts.  For any non-urgent questions, you may also contact your provider using MyChart. We now offer e-Visits for anyone 33 and older to request care online for non-urgent symptoms. For details visit [email or web address].   Also download the MyChart app! Go to the app store, search "MyChart", open the app, select [facility], and log in with your MyChart username and password.  Due to Covid, a mask is required upon entering the hospital/clinic. If you do not have a mask, one will be given to you upon arrival. For doctor visits, patients may have 1 support person aged 71 or older with them. For treatment visits, patients cannot have anyone with them due to current Covid guidelines and our immunocompromised population.

## 2021-03-13 NOTE — Progress Notes (Signed)
Hematology and Oncology Follow Up Visit  Brian [name] [id number] 1975-05-06 46 y.o. 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 + Bilateral pulmonary emboli without right heart strain  DVT left popliteal vein    Past Therapy: Cabometyx 40 mg po q day -- start on 04/17/2019 - d/c on 07/17/2019 for progression Lenvima 24 mg po q day -- on hold due to proteinuria Taxotere/CDDP -- s/p cycle 4 - started on 09/13/2019   Current Therapy:      Vinblastine q week x 4 weeks on / 1 week off -- start cycle #1 on 02/24/2021 Gavreto 400 mg po q day -- started on 05/17/2020  -- d/c on 06/16/2020 Adriamycin weekly 2 weeks on 1 week off - s/p cycle 8 --d/c on 04/16/2020 due to progression XRT for LEFT hip met Pembrolizumab 200 mg IV q 3 week -- s/p cycle #8 - start on 06/26/2020 -- d/c on 01/16/2021 due to progression Zometa 4 mg IV q 3 months - next dose on 04/2021 Eliquis 5 mg po BID   Interim History:  [name] is here today for follow-up.  We now have him on weekly vinblastine.  There have been some studies that have shown that this might be a little bit helpful.  He had more radiation therapy to his left hip.  I think that he completed this on 03/07/2021.  He has less pain in the left hip.  He is still have a lot of pain in the liver.  We will see about switching out the long-acting oxycodone and try him on a fentanyl patch.  We will try him on a fentanyl patch at 50 mcg every 3 days.  There is been no problems with nausea or vomiting.  His weight seems to be holding pretty steady.  He has had no issues with bowels or bladder..  The 1 probably has marked tachycardia.  I am not sure is why he has a tachycardia.  We did do a EKG on him.  This showed sinus tachycardia with some occasional PVCs.  His potassium was 3.7.  We will try to keep his potassium above 4.  He may need to have an echocardiogram to try to look at his myocardial  function.  Ultimately, we may need to get him to cardiology.  I will also try to put him on some Lopressor to see if this may help slow his heart rate down.  He has had little bit of leg swelling but this is been more chronic.  I just want his quality of life to be as good as possible.  Overall, I would say his performance status is probably ECOG 1.  A   Medications:  Allergies as of 03/13/2021       Reactions   Dextromethorphan-guaifenesin Other (See Comments)   Irregular heartbeat   Doxycycline Anaphylaxis, Nausea And Vomiting, Rash, Other (See Comments)   "heart arrythmia" and "dyspepsia" (only oral doxycycline causes reaction)   Gadobutrol Hives, Other (See Comments)   Patient had MRI scan at [place]. Patient called one hour after he left imaging facility to report two "blisters" that came up on "back" of lip.    Gadolinium Derivatives Hives   Patient had MRI scan at [place]. Patient called one hour after he left imaging facility to report two "blisters" that came up on "back" of lip.    Guaifenesin Palpitations      Ibuprofen  Hives, Itching   Lisinopril Other (See Comments)   Angioedema   Pantoprazole Itching   Tramadol Hives, Itching   Barium Rash   Developed redness around neck after drinking 1st bottle of Barocat; pt was given Benedryl by ED Mds to be "on the safe side" before drinking 2nd bottle   Chlorhexidine Hives        Medication List        Accurate as of March 13, 2021 12:46 PM. If you have any questions, ask your nurse or doctor.          STOP taking these medications    Xtampza ER 36 MG C12a Generic drug: oxyCODONE ER Stopped by: [name], MD       TAKE these medications    cyclobenzaprine 10 MG tablet Commonly known as: FLEXERIL Take 1 tablet (10 mg total) by mouth 3 (three) times daily as needed.   diphenhydrAMINE 25 mg capsule Commonly known as: BENADRYL TAKE 1 CAPSULE BY MOUTH 1 HOUR BEFORE CT SCAN    Eliquis 5 MG Tabs tablet Generic drug: apixaban TAKE 1 TABLET (5 MG TOTAL) BY MOUTH 2 (TWO) TIMES DAILY.   EPINEPHrine 0.3 mg/0.3 mL Soaj injection Commonly known as: EpiPen 2-Pak USE AS DIRECTED FOR LIFE THREATENING ALLERGIC REACTIONS   famotidine 40 MG tablet Commonly known as: PEPCID Take 1 tablet (40 mg total) by mouth 2 (two) times daily.   fentaNYL 50 MCG/HR Commonly known as: [place] 1 patch onto the skin every 3 (three) days. Started by: [name], MD   Fusion Plus Caps Take 1 capsule by mouth daily.   HYDROmorphone 8 MG tablet Commonly known as: DILAUDID Take 1 tablet (8 mg total) by mouth every 4 (four) hours as needed for severe pain.   levothyroxine 200 MCG tablet Commonly known as: SYNTHROID Take one tablet (200 mcg dose) by mouth daily. Total:  288 mcg/day.   levothyroxine 88 MCG tablet Commonly known as: SYNTHROID Take one tablet (88 mcg dose) by mouth daily. Total:  288 mcg/day.   LORazepam 1 MG tablet Commonly known as: ATIVAN Take 1 tablet (1 mg total) by mouth every 6 (six) hours as needed for anxiety.   methylphenidate 10 MG tablet Commonly known as: RITALIN Take 1 tablet (10 mg total) by mouth 2 (two) times daily.   metoCLOPramide 10 MG tablet Commonly known as: REGLAN Take 1 tablet (10 mg total) by mouth every 6 (six) hours as needed for nausea or vomiting.   multivitamin capsule Take 1 capsule by mouth daily.   OLANZapine 10 MG tablet Commonly known as: ZYPREXA TAKE 1 TABLET (10 MG TOTAL) BY MOUTH AT BEDTIME.   polyethylene glycol 17 g packet Commonly known as: MIRALAX / GLYCOLAX Take 17 g by mouth daily as needed for mild constipation.   predniSONE 20 MG tablet Commonly known as: DELTASONE Taper: Take 3 tablets (60 mg) by mouth daily for 3 days, then 2 tablets (40 mg) by mouth daily for 5 days and then 1 tablet (20 mg) by mouth daily.   promethazine 25 MG tablet Commonly known as: PHENERGAN TAKE 1 TABLET BY MOUTH  EVERY 6 HOURS AS NEEDED FOR NAUSEA & VOMITING   senna-docusate 8.6-50 MG tablet Commonly known as: Senokot-S Take 1 tablet by mouth at bedtime as needed for mild constipation.   temazepam 30 MG capsule Commonly known as: RESTORIL Take 1 capsule (30 mg total) by mouth at bedtime as needed for sleep.  Allergies:  Allergies  Allergen Reactions   Dextromethorphan-Guaifenesin Other (See Comments)    Irregular heartbeat    Doxycycline Anaphylaxis, Nausea And Vomiting, Rash and Other (See Comments)    "heart arrythmia" and "dyspepsia" (only oral doxycycline causes reaction)   Gadobutrol Hives and Other (See Comments)    Patient had MRI scan at [place]. Patient called one hour after he left imaging facility to report two "blisters" that came up on "back" of lip.     Gadolinium Derivatives Hives    Patient had MRI scan at [place]. Patient called one hour after he left imaging facility to report two "blisters" that came up on "back" of lip.    Guaifenesin Palpitations        Ibuprofen Hives and Itching   Lisinopril Other (See Comments)    Angioedema   Pantoprazole Itching   Tramadol Hives and Itching   Barium Rash    Developed redness around neck after drinking 1st bottle of Barocat; pt was given Benedryl by ED Mds to be "on the safe side" before drinking 2nd bottle   Chlorhexidine Hives    Past Medical History, Surgical history, Social history, and Family History were reviewed and updated.  Review of Systems: Review of Systems  Constitutional:  Positive for malaise/fatigue.  HENT: Negative.    Eyes: Negative.   Respiratory: Negative.    Cardiovascular: Negative.   Gastrointestinal:  Positive for abdominal pain, constipation, heartburn and nausea.  Genitourinary: Negative.   Musculoskeletal:  Positive for joint pain.  Skin: Negative.   Neurological: Negative.   Endo/Heme/Allergies: Negative.   Psychiatric/Behavioral: Negative.      Physical  Exam:  height is 5' 10" (1.778 m) and weight is 265 lb 0.6 oz (120.2 kg). His oral temperature is 99.9 F (37.7 C). His blood pressure is 124/90 and his pulse is 143 (abnormal). His respiration is 20 and oxygen saturation is 97%.   Wt Readings from Last 3 Encounters:  03/13/21 265 lb 0.6 oz (120.2 kg)  03/13/21 265 lb 0.6 oz (120.2 kg)  02/03/21 264 lb 1.9 oz (119.8 kg)    Physical Exam Vitals reviewed.  HENT:     Head: Normocephalic and atraumatic.  Eyes:     Pupils: Pupils are equal, round, and reactive to light.  Cardiovascular:     Heart sounds: Normal heart sounds.     Comments: Cardiac exam shows a tachycardic rate.  He has occasional extra beat.  I do not hear any obvious murmurs or rubs. Pulmonary:     Effort: Pulmonary effort is normal.     Breath sounds: Normal breath sounds.  Abdominal:     General: Bowel sounds are normal.     Palpations: Abdomen is soft.  Musculoskeletal:        General: No tenderness or deformity. Normal range of motion.     Cervical back: Normal range of motion.  Lymphadenopathy:     Cervical: No cervical adenopathy.  Skin:    General: Skin is warm and dry.     Findings: No erythema or rash.  Neurological:     Mental Status: He is alert and oriented to person, place, and time.  Psychiatric:        Behavior: Behavior normal.        Thought Content: Thought content normal.        Judgment: Judgment normal.     Lab Results  Component Value Date   WBC 3.0 (L) 03/13/2021   HGB 10.5 (L) 03/13/2021  HCT 32.9 (L) 03/13/2021   MCV 85.9 03/13/2021   PLT 219 03/13/2021   Lab Results  Component Value Date   FERRITIN 775 (H) 08/08/2020   IRON 67 08/08/2020   TIBC 214 08/08/2020   UIBC 147 08/08/2020   IRONPCTSAT 31 08/08/2020   Lab Results  Component Value Date   RETICCTPCT 3.9 (H) 08/08/2020   RBC 3.83 (L) 03/13/2021   No results found for: KPAFRELGTCHN, LAMBDASER, KAPLAMBRATIO No results found for: IGGSERUM, IGA, IGMSERUM No  results found for: Kathrynn Ducking, MSPIKE, SPEI   Chemistry      Component Value Date/Time   NA 133 (L) 03/13/2021 0826   NA 141 02/06/2019 1210   K 3.7 03/13/2021 0826   CL 97 (L) 03/13/2021 0826   CO2 29 03/13/2021 0826   BUN 16 03/13/2021 0826   BUN 8 02/06/2019 1210   CREATININE 0.91 03/13/2021 0826      Component Value Date/Time   CALCIUM 9.7 03/13/2021 0826   ALKPHOS 127 (H) 03/13/2021 0826   AST 23 03/13/2021 0826   ALT 17 03/13/2021 0826   BILITOT 0.7 03/13/2021 0826       Impression and Plan: 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   We really are on last line of therapy that I can think of.  Hopefully, he will have some response with the vinblastine.  He will complete his first cycle of therapy next week.  Again the tachycardia is an issue.  We did the EKG.  Thankfully did not have atrial flutter.  I will see about an echocardiogram.  We will put him on a little extra potassium.  I will also see about a beta-blocker.  Again we will see if the [place] patch can help with his pain.  I just feel bad that he is having continued pain.  I know he is trying his best.  I have to give him full credit for doing such a great job.  He is incredibly motivated.  He has such a good family that are try to help out.  We will plan to see him back in about 3 weeks.  They will be the start of his second 4-week cycle of treatment.  After the second 4-week cycle of treatment, we will then plan for scans.   [name], MD 1/26/202312:46 PM

## 2021-03-13 NOTE — Progress Notes (Signed)
Per Dr. Marin Olp, okay to treat today despite VS

## 2021-03-13 NOTE — Progress Notes (Signed)
Pt to receive Potassium 40 meq po today per order of Dr. Marin Olp.

## 2021-03-14 ENCOUNTER — Other Ambulatory Visit (HOSPITAL_BASED_OUTPATIENT_CLINIC_OR_DEPARTMENT_OTHER): Payer: Self-pay

## 2021-03-19 ENCOUNTER — Other Ambulatory Visit (HOSPITAL_COMMUNITY): Payer: Self-pay

## 2021-03-19 ENCOUNTER — Other Ambulatory Visit: Payer: Self-pay | Admitting: *Deleted

## 2021-03-19 DIAGNOSIS — C73 Malignant neoplasm of thyroid gland: Secondary | ICD-10-CM

## 2021-03-19 DIAGNOSIS — M546 Pain in thoracic spine: Secondary | ICD-10-CM

## 2021-03-19 DIAGNOSIS — C799 Secondary malignant neoplasm of unspecified site: Secondary | ICD-10-CM

## 2021-03-20 ENCOUNTER — Other Ambulatory Visit (HOSPITAL_BASED_OUTPATIENT_CLINIC_OR_DEPARTMENT_OTHER): Payer: Self-pay

## 2021-03-20 ENCOUNTER — Other Ambulatory Visit: Payer: Self-pay | Admitting: Hematology & Oncology

## 2021-03-20 ENCOUNTER — Other Ambulatory Visit: Payer: Self-pay

## 2021-03-20 ENCOUNTER — Inpatient Hospital Stay: Payer: BC Managed Care – PPO

## 2021-03-20 ENCOUNTER — Other Ambulatory Visit: Payer: Self-pay | Admitting: *Deleted

## 2021-03-20 ENCOUNTER — Inpatient Hospital Stay: Payer: BC Managed Care – PPO | Attending: Hematology & Oncology

## 2021-03-20 VITALS — BP 133/85 | HR 115 | Temp 97.9°F | Resp 20

## 2021-03-20 VITALS — HR 107

## 2021-03-20 DIAGNOSIS — R110 Nausea: Secondary | ICD-10-CM

## 2021-03-20 DIAGNOSIS — Z95828 Presence of other vascular implants and grafts: Secondary | ICD-10-CM

## 2021-03-20 DIAGNOSIS — Z5111 Encounter for antineoplastic chemotherapy: Secondary | ICD-10-CM | POA: Insufficient documentation

## 2021-03-20 DIAGNOSIS — C73 Malignant neoplasm of thyroid gland: Secondary | ICD-10-CM

## 2021-03-20 DIAGNOSIS — Z79899 Other long term (current) drug therapy: Secondary | ICD-10-CM | POA: Insufficient documentation

## 2021-03-20 DIAGNOSIS — E291 Testicular hypofunction: Secondary | ICD-10-CM | POA: Insufficient documentation

## 2021-03-20 DIAGNOSIS — M546 Pain in thoracic spine: Secondary | ICD-10-CM

## 2021-03-20 DIAGNOSIS — C799 Secondary malignant neoplasm of unspecified site: Secondary | ICD-10-CM

## 2021-03-20 DIAGNOSIS — C787 Secondary malignant neoplasm of liver and intrahepatic bile duct: Secondary | ICD-10-CM | POA: Insufficient documentation

## 2021-03-20 DIAGNOSIS — C7951 Secondary malignant neoplasm of bone: Secondary | ICD-10-CM | POA: Diagnosis not present

## 2021-03-20 LAB — CBC WITH DIFFERENTIAL (CANCER CENTER ONLY)
Abs Immature Granulocytes: 0.06 10*3/uL (ref 0.00–0.07)
Basophils Absolute: 0 10*3/uL (ref 0.0–0.1)
Basophils Relative: 1 %
Eosinophils Absolute: 0.1 10*3/uL (ref 0.0–0.5)
Eosinophils Relative: 2 %
HCT: 29.9 % — ABNORMAL LOW (ref 39.0–52.0)
Hemoglobin: 9.7 g/dL — ABNORMAL LOW (ref 13.0–17.0)
Immature Granulocytes: 2 %
Lymphocytes Relative: 11 %
Lymphs Abs: 0.4 10*3/uL — ABNORMAL LOW (ref 0.7–4.0)
MCH: 27.6 pg (ref 26.0–34.0)
MCHC: 32.4 g/dL (ref 30.0–36.0)
MCV: 85.2 fL (ref 80.0–100.0)
Monocytes Absolute: 0.2 10*3/uL (ref 0.1–1.0)
Monocytes Relative: 6 %
Neutro Abs: 2.5 10*3/uL (ref 1.7–7.7)
Neutrophils Relative %: 78 %
Platelet Count: 214 10*3/uL (ref 150–400)
RBC: 3.51 MIL/uL — ABNORMAL LOW (ref 4.22–5.81)
RDW: 16.7 % — ABNORMAL HIGH (ref 11.5–15.5)
WBC Count: 3.2 10*3/uL — ABNORMAL LOW (ref 4.0–10.5)
nRBC: 0 % (ref 0.0–0.2)

## 2021-03-20 LAB — CMP (CANCER CENTER ONLY)
ALT: 28 U/L (ref 0–44)
AST: 32 U/L (ref 15–41)
Albumin: 3.9 g/dL (ref 3.5–5.0)
Alkaline Phosphatase: 120 U/L (ref 38–126)
Anion gap: 6 (ref 5–15)
BUN: 9 mg/dL (ref 6–20)
CO2: 28 mmol/L (ref 22–32)
Calcium: 9.2 mg/dL (ref 8.9–10.3)
Chloride: 102 mmol/L (ref 98–111)
Creatinine: 0.94 mg/dL (ref 0.61–1.24)
GFR, Estimated: >60 mL/min (ref >60)
Glucose, Bld: 117 mg/dL — ABNORMAL HIGH (ref 70–99)
Potassium: 4.3 mmol/L (ref 3.5–5.1)
Sodium: 136 mmol/L (ref 135–145)
Total Bilirubin: 0.5 mg/dL (ref 0.3–1.2)
Total Protein: 6 g/dL — ABNORMAL LOW (ref 6.5–8.1)

## 2021-03-20 MED ORDER — PROCHLORPERAZINE MALEATE 10 MG PO TABS: 10.0000 mg | ORAL_TABLET | Freq: Once | ORAL | Status: DC

## 2021-03-20 MED ORDER — SODIUM CHLORIDE 0.9% FLUSH
10.0000 mL | INTRAVENOUS | Status: DC | PRN
  Administered 2021-03-20: 10 mL

## 2021-03-20 MED ORDER — LORAZEPAM 1 MG PO TABS
1.0000 mg | ORAL_TABLET | Freq: Four times a day (QID) | ORAL | 0 refills | Status: DC | PRN
  Filled 2021-03-20 – 2021-04-02 (×2): qty 30, 8d supply, fill #0

## 2021-03-20 MED ORDER — PROCHLORPERAZINE MALEATE 10 MG PO TABS
10.0000 mg | ORAL_TABLET | Freq: Four times a day (QID) | ORAL | 3 refills | Status: DC | PRN
  Filled 2021-03-20: qty 30, 8d supply, fill #0
  Filled 2021-04-14: qty 30, 8d supply, fill #1
  Filled 2021-04-28: qty 30, 8d supply, fill #2
  Filled 2021-05-22: qty 30, 8d supply, fill #3

## 2021-03-20 MED ORDER — SODIUM CHLORIDE 0.9 % IV SOLN: Freq: Once | INTRAVENOUS | Status: AC

## 2021-03-20 MED ORDER — SODIUM CHLORIDE 0.9% FLUSH: 10.0000 mL | Freq: Once | INTRAVENOUS | Status: DC

## 2021-03-20 MED ORDER — HEPARIN SOD (PORK) LOCK FLUSH 100 UNIT/ML IV SOLN
500.0000 [IU] | Freq: Once | INTRAVENOUS | Status: AC | PRN
  Administered 2021-03-20: 500 [IU]

## 2021-03-20 MED ORDER — VINBLASTINE SULFATE CHEMO INJECTION 1 MG/ML
6.1700 mg/m2 | Freq: Once | INTRAVENOUS | Status: AC
  Administered 2021-03-20: 15 mg via INTRAVENOUS
  Filled 2021-03-20: qty 15

## 2021-03-20 NOTE — Progress Notes (Signed)
OK to treat today with HR-107 per order of Dr. Marin Olp.

## 2021-03-20 NOTE — Patient Instructions (Signed)

## 2021-03-20 NOTE — Patient Instructions (Signed)
Vinblastine injection What is this medication? VINBLASTINE (vin BLAS teen) is a chemotherapy drug. It slows the growth of cancer cells. This medicine is used to treat many types of cancer like breast cancer, testicular cancer, Hodgkin's disease, non-Hodgkin's lymphoma, and sarcoma. This medicine may be used for other purposes; ask your health care provider or pharmacist if you have questions. COMMON BRAND NAME(S): Velban What should I tell my care team before I take this medication? They need to know if you have any of these conditions: blood disorders dental disease gout infection (especially a virus infection such as chickenpox, cold sores, or herpes) liver disease lung disease nervous system disease recent or ongoing radiation therapy an unusual or allergic reaction to vinblastine, other chemotherapy agents, other medicines, foods, dyes, or preservatives pregnant or trying to get pregnant breast-feeding How should I use this medication? This drug is given as an infusion into a vein. It is administered in a hospital or clinic by a specially trained health care professional. If you have pain, swelling, burning or any unusual feeling around the site of your injection, tell your health care professional right away. Talk to your pediatrician regarding the use of this medicine in children. While this drug may be prescribed for selected conditions, precautions do apply. Overdosage: If you think you have taken too much of this medicine contact a poison control center or emergency room at once. NOTE: This medicine is only for you. Do not share this medicine with others. What if I miss a dose? It is important not to miss your dose. Call your doctor or health care professional if you are unable to keep an appointment. What may interact with this medication? erythromycin certain medicines for fungal infections like itraconazole, ketoconazole, posaconazole, voriconazole certain medicines for  seizures like phenytoin This list may not describe all possible interactions. Give your health care provider a list of all the medicines, herbs, non-prescription drugs, or dietary supplements you use. Also tell them if you smoke, drink alcohol, or use illegal drugs. Some items may interact with your medicine. What should I watch for while using this medication? Your condition will be monitored carefully while you are receiving this medicine. You will need important blood work done while you are taking this medicine. This drug may make you feel generally unwell. This is not uncommon, as chemotherapy can affect healthy cells as well as cancer cells. Report any side effects. Continue your course of treatment even though you feel ill unless your doctor tells you to stop. In some cases, you may be given additional medicines to help with side effects. Follow all directions for their use. Call your doctor or health care professional for advice if you get a fever, chills or sore throat, or other symptoms of a cold or flu. Do not treat yourself. This drug decreases your body's ability to fight infections. Try to avoid being around people who are sick. This medicine may increase your risk to bruise or bleed. Call your doctor or health care professional if you notice any unusual bleeding. Be careful brushing and flossing your teeth or using a toothpick because you may get an infection or bleed more easily. If you have any dental work done, tell your dentist you are receiving this medicine. Avoid taking products that contain aspirin, acetaminophen, ibuprofen, naproxen, or ketoprofen unless instructed by your doctor. These medicines may hide a fever. Do not become pregnant while taking this medicine. Women should inform their doctor if they wish to become pregnant or  think they might be pregnant. There is a potential for serious side effects to an unborn child. Talk to your health care professional or pharmacist for  more information. Do not breast-feed an infant while taking this medicine. Men may have a lower sperm count while taking this medicine. Talk to your doctor if you plan to father a child. What side effects may I notice from receiving this medication? Side effects that you should report to your doctor or health care professional as soon as possible: allergic reactions like skin rash, itching or hives, swelling of the face, lips, or tongue low blood counts - This drug may decrease the number of white blood cells, red blood cells and platelets. You may be at increased risk for infections and bleeding. signs of infection - fever or chills, cough, sore throat, pain or difficulty passing urine signs of decreased platelets or bleeding - bruising, pinpoint red spots on the skin, black, tarry stools, nosebleeds signs of decreased red blood cells - unusually weak or tired, fainting spells, lightheadedness breathing problems changes in hearing change in the amount of urine chest pain high blood pressure mouth sores nausea and vomiting pain, swelling, redness or irritation at the injection site pain, tingling, numbness in the hands or feet problems with balance, dizziness seizures Side effects that usually do not require medical attention (report to your doctor or health care professional if they continue or are bothersome): constipation hair loss jaw pain loss of appetite sensitivity to light stomach pain tumor pain This list may not describe all possible side effects. Call your doctor for medical advice about side effects. You may report side effects to FDA at 1-800-FDA-1088. Where should I keep my medication? This drug is given in a hospital or clinic and will not be stored at home. NOTE: This sheet is a summary. It may not cover all possible information. If you have questions about this medicine, talk to your doctor, pharmacist, or health care provider.  2022 [employer]/Gold Standard  (2020-10-22 00:00:00)

## 2021-03-24 ENCOUNTER — Other Ambulatory Visit (HOSPITAL_BASED_OUTPATIENT_CLINIC_OR_DEPARTMENT_OTHER): Payer: Self-pay

## 2021-03-25 ENCOUNTER — Encounter: Payer: Self-pay | Admitting: Family

## 2021-03-25 ENCOUNTER — Ambulatory Visit (HOSPITAL_COMMUNITY): Payer: BC Managed Care – PPO | Attending: Cardiology

## 2021-03-25 ENCOUNTER — Other Ambulatory Visit: Payer: Self-pay

## 2021-03-25 ENCOUNTER — Encounter: Payer: Self-pay | Admitting: Hematology & Oncology

## 2021-03-25 DIAGNOSIS — R0602 Shortness of breath: Secondary | ICD-10-CM

## 2021-03-25 DIAGNOSIS — R000 Tachycardia, unspecified: Secondary | ICD-10-CM | POA: Insufficient documentation

## 2021-03-25 LAB — ECHOCARDIOGRAM COMPLETE
Area-P 1/2: 5.97 cm2
S' Lateral: 3.1 cm

## 2021-03-27 ENCOUNTER — Other Ambulatory Visit: Payer: 59

## 2021-03-27 ENCOUNTER — Ambulatory Visit: Payer: 59

## 2021-03-28 ENCOUNTER — Encounter: Payer: Self-pay | Admitting: Family

## 2021-03-28 ENCOUNTER — Other Ambulatory Visit (HOSPITAL_BASED_OUTPATIENT_CLINIC_OR_DEPARTMENT_OTHER): Payer: Self-pay

## 2021-03-28 ENCOUNTER — Encounter: Payer: Self-pay | Admitting: Hematology & Oncology

## 2021-04-01 ENCOUNTER — Encounter: Payer: Self-pay | Admitting: Family

## 2021-04-01 ENCOUNTER — Encounter: Payer: Self-pay | Admitting: Hematology & Oncology

## 2021-04-02 ENCOUNTER — Other Ambulatory Visit (HOSPITAL_BASED_OUTPATIENT_CLINIC_OR_DEPARTMENT_OTHER): Payer: Self-pay

## 2021-04-02 ENCOUNTER — Other Ambulatory Visit: Payer: Self-pay

## 2021-04-02 ENCOUNTER — Other Ambulatory Visit: Payer: Self-pay | Admitting: Hematology & Oncology

## 2021-04-02 DIAGNOSIS — M546 Pain in thoracic spine: Secondary | ICD-10-CM

## 2021-04-02 DIAGNOSIS — C73 Malignant neoplasm of thyroid gland: Secondary | ICD-10-CM

## 2021-04-02 DIAGNOSIS — C799 Secondary malignant neoplasm of unspecified site: Secondary | ICD-10-CM

## 2021-04-02 DIAGNOSIS — C787 Secondary malignant neoplasm of liver and intrahepatic bile duct: Secondary | ICD-10-CM

## 2021-04-02 DIAGNOSIS — G893 Neoplasm related pain (acute) (chronic): Secondary | ICD-10-CM

## 2021-04-02 MED ORDER — XTAMPZA ER 36 MG PO C12A
36.0000 mg | EXTENDED_RELEASE_CAPSULE | Freq: Two times a day (BID) | ORAL | 0 refills | Status: DC
  Filled 2021-04-02: qty 60, 30d supply, fill #0

## 2021-04-03 ENCOUNTER — Inpatient Hospital Stay (HOSPITAL_BASED_OUTPATIENT_CLINIC_OR_DEPARTMENT_OTHER): Payer: BC Managed Care – PPO | Admitting: Hematology & Oncology

## 2021-04-03 ENCOUNTER — Other Ambulatory Visit: Payer: Self-pay

## 2021-04-03 ENCOUNTER — Ambulatory Visit: Payer: 59

## 2021-04-03 ENCOUNTER — Encounter: Payer: Self-pay | Admitting: Hematology & Oncology

## 2021-04-03 ENCOUNTER — Other Ambulatory Visit (HOSPITAL_BASED_OUTPATIENT_CLINIC_OR_DEPARTMENT_OTHER): Payer: Self-pay

## 2021-04-03 ENCOUNTER — Inpatient Hospital Stay: Payer: BC Managed Care – PPO

## 2021-04-03 ENCOUNTER — Other Ambulatory Visit: Payer: 59

## 2021-04-03 VITALS — BP 129/73 | HR 114 | Temp 98.7°F | Resp 17 | Ht 70.0 in | Wt 257.0 lb

## 2021-04-03 VITALS — BP 120/86 | HR 90 | Resp 18

## 2021-04-03 DIAGNOSIS — C787 Secondary malignant neoplasm of liver and intrahepatic bile duct: Secondary | ICD-10-CM | POA: Diagnosis not present

## 2021-04-03 DIAGNOSIS — C73 Malignant neoplasm of thyroid gland: Secondary | ICD-10-CM

## 2021-04-03 DIAGNOSIS — E291 Testicular hypofunction: Secondary | ICD-10-CM | POA: Diagnosis not present

## 2021-04-03 DIAGNOSIS — C799 Secondary malignant neoplasm of unspecified site: Secondary | ICD-10-CM

## 2021-04-03 DIAGNOSIS — Z5111 Encounter for antineoplastic chemotherapy: Secondary | ICD-10-CM | POA: Diagnosis not present

## 2021-04-03 LAB — CBC WITH DIFFERENTIAL (CANCER CENTER ONLY)
Abs Immature Granulocytes: 0.01 10*3/uL (ref 0.00–0.07)
Basophils Absolute: 0 10*3/uL (ref 0.0–0.1)
Basophils Relative: 1 %
Eosinophils Absolute: 0 10*3/uL (ref 0.0–0.5)
Eosinophils Relative: 1 %
HCT: 31.4 % — ABNORMAL LOW (ref 39.0–52.0)
Hemoglobin: 10 g/dL — ABNORMAL LOW (ref 13.0–17.0)
Immature Granulocytes: 0 %
Lymphocytes Relative: 10 %
Lymphs Abs: 0.3 10*3/uL — ABNORMAL LOW (ref 0.7–4.0)
MCH: 27.6 pg (ref 26.0–34.0)
MCHC: 31.8 g/dL (ref 30.0–36.0)
MCV: 86.7 fL (ref 80.0–100.0)
Monocytes Absolute: 0.7 10*3/uL (ref 0.1–1.0)
Monocytes Relative: 25 %
Neutro Abs: 1.8 10*3/uL (ref 1.7–7.7)
Neutrophils Relative %: 63 %
Platelet Count: 236 10*3/uL (ref 150–400)
RBC: 3.62 MIL/uL — ABNORMAL LOW (ref 4.22–5.81)
RDW: 18.8 % — ABNORMAL HIGH (ref 11.5–15.5)
WBC Count: 2.9 10*3/uL — ABNORMAL LOW (ref 4.0–10.5)
nRBC: 0 % (ref 0.0–0.2)

## 2021-04-03 LAB — CMP (CANCER CENTER ONLY)
ALT: 24 U/L (ref 0–44)
AST: 35 U/L (ref 15–41)
Albumin: 3.8 g/dL (ref 3.5–5.0)
Alkaline Phosphatase: 114 U/L (ref 38–126)
Anion gap: 6 (ref 5–15)
BUN: 12 mg/dL (ref 6–20)
CO2: 28 mmol/L (ref 22–32)
Calcium: 8.9 mg/dL (ref 8.9–10.3)
Chloride: 98 mmol/L (ref 98–111)
Creatinine: 0.75 mg/dL (ref 0.61–1.24)
GFR, Estimated: >60 mL/min (ref >60)
Glucose, Bld: 112 mg/dL — ABNORMAL HIGH (ref 70–99)
Potassium: 4.1 mmol/L (ref 3.5–5.1)
Sodium: 132 mmol/L — ABNORMAL LOW (ref 135–145)
Total Bilirubin: 1.1 mg/dL (ref 0.3–1.2)
Total Protein: 6.7 g/dL (ref 6.5–8.1)

## 2021-04-03 LAB — LACTATE DEHYDROGENASE: LDH: 349 U/L — ABNORMAL HIGH (ref 98–192)

## 2021-04-03 MED ORDER — SODIUM CHLORIDE 0.9% FLUSH
10.0000 mL | INTRAVENOUS | Status: DC | PRN
  Administered 2021-04-03: 10 mL

## 2021-04-03 MED ORDER — PROCHLORPERAZINE MALEATE 10 MG PO TABS: 10.0000 mg | ORAL_TABLET | Freq: Once | ORAL | Status: DC

## 2021-04-03 MED ORDER — VINBLASTINE SULFATE CHEMO INJECTION 1 MG/ML
6.1700 mg/m2 | Freq: Once | INTRAVENOUS | Status: AC
  Administered 2021-04-03: 15 mg via INTRAVENOUS
  Filled 2021-04-03: qty 15

## 2021-04-03 MED ORDER — HEPARIN SOD (PORK) LOCK FLUSH 100 UNIT/ML IV SOLN
500.0000 [IU] | Freq: Once | INTRAVENOUS | Status: AC | PRN
  Administered 2021-04-03: 500 [IU]

## 2021-04-03 MED ORDER — SODIUM CHLORIDE 0.9 % IV SOLN: Freq: Once | INTRAVENOUS | Status: AC

## 2021-04-03 MED ORDER — SODIUM CHLORIDE 0.9 % IV SOLN
8.0000 mg | Freq: Once | INTRAVENOUS | Status: AC
  Administered 2021-04-03: 8 mg via INTRAVENOUS
  Filled 2021-04-03: qty 4

## 2021-04-03 MED ORDER — HYDROMORPHONE HCL 4 MG/ML IJ SOLN
4.0000 mg | Freq: Once | INTRAMUSCULAR | Status: AC
  Administered 2021-04-03: 4 mg via INTRAVENOUS
  Filled 2021-04-03: qty 1

## 2021-04-03 NOTE — Progress Notes (Signed)
Hematology and Oncology Follow Up Visit  [name] [id number] Dec 26, 1975 46 y.o. 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 + Bilateral pulmonary emboli without right heart strain  DVT left popliteal vein    Past Therapy: Cabometyx 40 mg po q day -- start on 04/17/2019 - d/c on 07/17/2019 for progression Lenvima 24 mg po q day -- on hold due to proteinuria Taxotere/CDDP -- s/p cycle 4 - started on 09/13/2019   Current Therapy:      Vinblastine q week x 4 weeks on / 1 week off -- s/p  cycle #1 -- start on 02/24/2021 Gavreto 400 mg po q day -- started on 05/17/2020  -- d/c on 06/16/2020 Adriamycin weekly 2 weeks on 1 week off - s/p cycle 8 --d/c on 04/16/2020 due to progression XRT for LEFT hip met Pembrolizumab 200 mg IV q 3 week -- s/p cycle #8 - start on 06/26/2020 -- d/c on 01/16/2021 due to progression Zometa 4 mg IV q 3 months - next dose on 04/2021 Eliquis 5 mg po BID   Interim History:  [name] is here today for follow-up.  We now have him on weekly vinblastine.  There have been some studies that have shown that this might be a little bit helpful.  He had more radiation therapy to his left hip.  I think that he completed this on 03/07/2021.  He has less pain in the left hip.  He cannot tolerate the fentanyl patch.  As such, he put himself back on the long-acting oxycodone.  He says it seems to make him a little bit less tired.  He is still tachycardic.  He is sinus tachycardia.  I suspect this might be in part from his thyroid medication.  I told him to stop the 88 mcg dose of Synthroid.  He only will take the 200 mcg dose.  He did have a nice Valentine's Day.  He did have an echocardiogram done on 03/25/2021.  This showed an ejection fraction of 50-55%.  He is not sure he is taking the Lopressor.  I told him that he really needs to take the Lopressor.  He does have some sweats.  We are checking a  testosterone level on him.  It is possible that he may have low testosterone that could be causing some of his issues.  His testosterone was only 73.  He clearly will need some testosterone supplementation.  We will have to see about getting him back in for testosterone injections.  Again, this is strictly a quality-of-life issue.  His appetite seems to come and go.  He is eating okay.  He has had a little bit of nausea but no vomiting.  Currently, I would say his performance status is probably ECOG 2.  Medications:  Allergies as of 04/03/2021       Reactions   Dextromethorphan-guaifenesin Other (See Comments)   Irregular heartbeat   Doxycycline Anaphylaxis, Nausea And Vomiting, Rash, Other (See Comments)   "heart arrythmia" and "dyspepsia" (only oral doxycycline causes reaction)   Gadobutrol Hives, Other (See Comments)   Patient had MRI scan at [place]. Patient called one hour after he left imaging facility to report two "blisters" that came up on "back" of lip.    Gadolinium Derivatives Hives   Patient had MRI scan at [place]. Patient called one hour after he left imaging facility to report two "blisters" that came up on "  back" of lip.    Guaifenesin Palpitations      Ibuprofen Hives, Itching   Lisinopril Other (See Comments)   Angioedema   Pantoprazole Itching   Tramadol Hives, Itching   Barium Rash   Developed redness around neck after drinking 1st bottle of Barocat; pt was given Benedryl by ED Mds to be "on the safe side" before drinking 2nd bottle   Chlorhexidine Hives        Medication List        Accurate as of April 03, 2021  8:38 AM. If you have any questions, ask your nurse or doctor.          cyclobenzaprine 10 MG tablet Commonly known as: FLEXERIL Take 1 tablet (10 mg total) by mouth 3 (three) times daily as needed.   diphenhydrAMINE 25 mg capsule Commonly known as: BENADRYL TAKE 1 CAPSULE BY MOUTH 1 HOUR BEFORE CT SCAN   Eliquis  5 MG Tabs tablet Generic drug: apixaban TAKE 1 TABLET (5 MG TOTAL) BY MOUTH 2 (TWO) TIMES DAILY.   EPINEPHrine 0.3 mg/0.3 mL Soaj injection Commonly known as: EpiPen 2-Pak USE AS DIRECTED FOR LIFE THREATENING ALLERGIC REACTIONS   famotidine 40 MG tablet Commonly known as: PEPCID Take 1 tablet (40 mg total) by mouth 2 (two) times daily.   fentaNYL 50 MCG/HR Commonly known as: [place] 1 patch onto the skin every 3 (three) days.   Fusion Plus Caps Take 1 capsule by mouth daily.   HYDROmorphone 8 MG tablet Commonly known as: DILAUDID Take 1 tablet (8 mg total) by mouth every 4 (four) hours as needed for severe pain.   levothyroxine 200 MCG tablet Commonly known as: SYNTHROID Take one tablet (200 mcg dose) by mouth daily. Total:  288 mcg/day.   levothyroxine 88 MCG tablet Commonly known as: SYNTHROID Take one tablet (88 mcg dose) by mouth daily. Total:  288 mcg/day.   LORazepam 1 MG tablet Commonly known as: ATIVAN Take 1 tablet (1 mg total) by mouth every 6 (six) hours as needed for anxiety.   methylphenidate 10 MG tablet Commonly known as: RITALIN Take 1 tablet (10 mg total) by mouth 2 (two) times daily.   metoCLOPramide 10 MG tablet Commonly known as: REGLAN Take 1 tablet (10 mg total) by mouth every 6 (six) hours as needed for nausea or vomiting.   metoprolol tartrate 50 MG tablet Commonly known as: Lopressor Take 1 tablet (50 mg total) by mouth 2 (two) times daily.   multivitamin capsule Take 1 capsule by mouth daily.   OLANZapine 10 MG tablet Commonly known as: ZYPREXA TAKE 1 TABLET (10 MG TOTAL) BY MOUTH AT BEDTIME.   polyethylene glycol 17 g packet Commonly known as: MIRALAX / GLYCOLAX Take 17 g by mouth daily as needed for mild constipation.   potassium chloride SA 20 MEQ tablet Commonly known as: KLOR-CON M Take 1 tablet (20 mEq total) by mouth daily.   predniSONE 20 MG tablet Commonly known as: DELTASONE Taper: Take 3 tablets (60 mg) by  mouth daily for 3 days, then 2 tablets (40 mg) by mouth daily for 5 days and then 1 tablet (20 mg) by mouth daily.   prochlorperazine 10 MG tablet Commonly known as: COMPAZINE Take 1 tablet (10 mg total) by mouth every 6 (six) hours as needed (Nausea or vomiting).   promethazine 25 MG tablet Commonly known as: PHENERGAN TAKE 1 TABLET BY MOUTH EVERY 6 HOURS AS NEEDED FOR NAUSEA & VOMITING   senna-docusate 8.6-50 MG  tablet Commonly known as: Senokot-S Take 1 tablet by mouth at bedtime as needed for mild constipation.   temazepam 30 MG capsule Commonly known as: RESTORIL Take 1 capsule (30 mg total) by mouth at bedtime as needed for sleep.   Xtampza ER 36 MG C12a Generic drug: oxyCODONE ER Take 1 capsule (36 mg total) by mouth every 12 (twelve) hours        Allergies:  Allergies  Allergen Reactions   Dextromethorphan-Guaifenesin Other (See Comments)    Irregular heartbeat    Doxycycline Anaphylaxis, Nausea And Vomiting, Rash and Other (See Comments)    "heart arrythmia" and "dyspepsia" (only oral doxycycline causes reaction)   Gadobutrol Hives and Other (See Comments)    Patient had MRI scan at [place]. Patient called one hour after he left imaging facility to report two "blisters" that came up on "back" of lip.     Gadolinium Derivatives Hives    Patient had MRI scan at [place]. Patient called one hour after he left imaging facility to report two "blisters" that came up on "back" of lip.    Guaifenesin Palpitations        Ibuprofen Hives and Itching   Lisinopril Other (See Comments)    Angioedema   Pantoprazole Itching   Tramadol Hives and Itching   Barium Rash    Developed redness around neck after drinking 1st bottle of Barocat; pt was given Benedryl by ED Mds to be "on the safe side" before drinking 2nd bottle   Chlorhexidine Hives    Past Medical History, Surgical history, Social history, and Family History were reviewed and  updated.  Review of Systems: Review of Systems  Constitutional:  Positive for malaise/fatigue.  HENT: Negative.    Eyes: Negative.   Respiratory: Negative.    Cardiovascular: Negative.   Gastrointestinal:  Positive for abdominal pain, constipation, heartburn and nausea.  Genitourinary: Negative.   Musculoskeletal:  Positive for joint pain.  Skin: Negative.   Neurological: Negative.   Endo/Heme/Allergies: Negative.   Psychiatric/Behavioral: Negative.      Physical Exam:  vitals were not taken for this visit.   Wt Readings from Last 3 Encounters:  03/13/21 265 lb 0.6 oz (120.2 kg)  03/13/21 265 lb 0.6 oz (120.2 kg)  02/03/21 264 lb 1.9 oz (119.8 kg)  His vital signs are temperature of 98.7.  Pulse 114.  Blood pressure 129/73.  Weight is 257 pounds.  Physical Exam Vitals reviewed.  HENT:     Head: Normocephalic and atraumatic.  Eyes:     Pupils: Pupils are equal, round, and reactive to light.  Cardiovascular:     Heart sounds: Normal heart sounds.     Comments: Cardiac exam shows a tachycardic rate.  He has occasional extra beat.  I do not hear any obvious murmurs or rubs. Pulmonary:     Effort: Pulmonary effort is normal.     Breath sounds: Normal breath sounds.  Abdominal:     General: Bowel sounds are normal.     Palpations: Abdomen is soft.  Musculoskeletal:        General: No tenderness or deformity. Normal range of motion.     Cervical back: Normal range of motion.  Lymphadenopathy:     Cervical: No cervical adenopathy.  Skin:    General: Skin is warm and dry.     Findings: No erythema or rash.  Neurological:     Mental Status: He is alert and oriented to person, place, and time.  Psychiatric:  Behavior: Behavior normal.        Thought Content: Thought content normal.        Judgment: Judgment normal.     Lab Results  Component Value Date   WBC 2.9 (L) 04/03/2021   HGB 10.0 (L) 04/03/2021   HCT 31.4 (L) 04/03/2021   MCV 86.7 04/03/2021   PLT  236 04/03/2021   Lab Results  Component Value Date   FERRITIN 775 (H) 08/08/2020   IRON 67 08/08/2020   TIBC 214 08/08/2020   UIBC 147 08/08/2020   IRONPCTSAT 31 08/08/2020   Lab Results  Component Value Date   RETICCTPCT 3.9 (H) 08/08/2020   RBC 3.62 (L) 04/03/2021   No results found for: KPAFRELGTCHN, LAMBDASER, KAPLAMBRATIO No results found for: IGGSERUM, IGA, IGMSERUM No results found for: Odetta Pink, SPEI   Chemistry      Component Value Date/Time   NA 136 03/20/2021 0858   NA 141 02/06/2019 1210   K 4.3 03/20/2021 0858   CL 102 03/20/2021 0858   CO2 28 03/20/2021 0858   BUN 9 03/20/2021 0858   BUN 8 02/06/2019 1210   CREATININE 0.94 03/20/2021 0858      Component Value Date/Time   CALCIUM 9.2 03/20/2021 0858   ALKPHOS 120 03/20/2021 0858   AST 32 03/20/2021 0858   ALT 28 03/20/2021 0858   BILITOT 0.5 03/20/2021 0858       Impression and Plan: 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   He is on single agent vinblastine.  Hopefully, this will help with some tumor regression.  We will likely plan for scans to be done in March.  We will get him on testosterone supplementation.  Hopefully this will help make him feel little bit better.  This may give him a little more energy.  Again, everything we are doing is strictly quality of life for him.  He has 3 more weeks of treatment.  After the eighth treatment, we will then plan for scans and see everything looks.  Again, we will give him testosterone next week.  He will get this every 2 weeks.  I will see him back in late March.  We will have his scans done by then.   [name], MD 2/16/20238:38 AM

## 2021-04-03 NOTE — Patient Instructions (Signed)

## 2021-04-03 NOTE — Patient Instructions (Signed)
[facility]  Discharge Instructions: Thank you for choosing [facility] to provide your oncology and hematology care.   If you have a lab appointment with the [facility], please go directly to the [facility] and check in at the registration area.  Wear comfortable clothing and clothing appropriate for easy access to any Portacath or PICC line.   We strive to give you quality time with your provider. You may need to reschedule your appointment if you arrive late (15 or more minutes).  Arriving late affects you and other patients whose appointments are after yours.  Also, if you miss three or more appointments without notifying the office, you may be dismissed from the clinic at the providers discretion.      For prescription refill requests, have your pharmacy contact our office and allow 72 hours for refills to be completed.    Today you received the following chemotherapy and/or immunotherapy agents Velban      To help prevent nausea and vomiting after your treatment, we encourage you to take your nausea medication as directed.  BELOW ARE SYMPTOMS THAT SHOULD BE REPORTED IMMEDIATELY: *FEVER GREATER THAN 100.4 F (38 C) OR HIGHER *CHILLS OR SWEATING *NAUSEA AND VOMITING THAT IS NOT CONTROLLED WITH YOUR NAUSEA MEDICATION *UNUSUAL SHORTNESS OF BREATH *UNUSUAL BRUISING OR BLEEDING *URINARY PROBLEMS (pain or burning when urinating, or frequent urination) *BOWEL PROBLEMS (unusual diarrhea, constipation, pain near the anus) TENDERNESS IN MOUTH AND THROAT WITH OR WITHOUT PRESENCE OF ULCERS (sore throat, sores in mouth, or a toothache) UNUSUAL RASH, SWELLING OR PAIN  UNUSUAL VAGINAL DISCHARGE OR ITCHING   Items with * indicate a potential emergency and should be followed up as soon as possible or go to the Emergency Department if any problems should occur.  Please show the CHEMOTHERAPY ALERT CARD or IMMUNOTHERAPY ALERT CARD at check-in to the  Emergency Department and triage nurse. Should you have questions after your visit or need to cancel or reschedule your appointment, please contact [facility]  [phone] and follow the prompts.  Office hours are 8:00 a.m. to 4:30 p.m. Monday - Friday. Please note that voicemails left after 4:00 p.m. may not be returned until the following business day.  We are closed weekends and major holidays. You have access to a nurse at all times for urgent questions. Please call the main number to the clinic [phone] and follow the prompts.  For any non-urgent questions, you may also contact your provider using MyChart. We now offer e-Visits for anyone 61 and older to request care online for non-urgent symptoms. For details visit [email or web address].   Also download the MyChart app! Go to the app store, search "MyChart", open the app, select [facility], and log in with your MyChart username and password.  Due to Covid, a mask is required upon entering the hospital/clinic. If you do not have a mask, one will be given to you upon arrival. For doctor visits, patients may have 1 support person aged 44 or older with them. For treatment visits, patients cannot have anyone with them due to current Covid guidelines and our immunocompromised population.

## 2021-04-04 ENCOUNTER — Encounter: Payer: Self-pay | Admitting: *Deleted

## 2021-04-04 ENCOUNTER — Encounter: Payer: Self-pay | Admitting: Hematology & Oncology

## 2021-04-04 ENCOUNTER — Encounter: Payer: Self-pay | Admitting: Family

## 2021-04-04 DIAGNOSIS — E291 Testicular hypofunction: Secondary | ICD-10-CM | POA: Insufficient documentation

## 2021-04-04 HISTORY — DX: Testicular hypofunction: E29.1

## 2021-04-04 LAB — THYROID PANEL WITH TSH
Free Thyroxine Index: 3.3 (ref 1.2–4.9)
T3 Uptake Ratio: 26 % (ref 24–39)
T4, Total: 12.6 ug/dL — ABNORMAL HIGH (ref 4.5–12.0)
TSH: 0.32 u[IU]/mL — ABNORMAL LOW (ref 0.450–4.500)

## 2021-04-04 LAB — TESTOSTERONE: Testosterone: 73 ng/dL — ABNORMAL LOW (ref 264–916)

## 2021-04-04 MED ORDER — TESTOSTERONE CYPIONATE 200 MG/ML IM SOLN: 200.0000 mg | INTRAMUSCULAR | Status: DC

## 2021-04-07 ENCOUNTER — Telehealth: Payer: Self-pay | Admitting: Hematology & Oncology

## 2021-04-07 NOTE — Telephone Encounter (Signed)
Called to schedule per 2/17 los , left voicemail for patient to call back to schedule

## 2021-04-09 ENCOUNTER — Encounter: Payer: Self-pay | Admitting: Family

## 2021-04-09 ENCOUNTER — Encounter: Payer: Self-pay | Admitting: Hematology & Oncology

## 2021-04-09 ENCOUNTER — Other Ambulatory Visit: Payer: Self-pay | Admitting: *Deleted

## 2021-04-09 DIAGNOSIS — C73 Malignant neoplasm of thyroid gland: Secondary | ICD-10-CM

## 2021-04-09 NOTE — Addendum Note (Signed)
Addended by: Burney Gauze R on: 04/09/2021 12:23 PM   Modules accepted: Orders

## 2021-04-09 NOTE — Progress Notes (Signed)
°  Radiation Oncology         (336) [phone] ________________________________  Name: Brian [name] MRN: [id number]  Date: 03/07/2021  DOB: 12-14-1975  End of Treatment Note  Diagnosis:   46 y.o. man with progressive metastatic thyroid cancer with painful disease in the thoracic spine and left hip/pelvis.     Indication for treatment:  Palliation       Radiation treatment dates:   02/24/21-03/07/21  Site/dose:    T4-T6  30 Gy in 10 fractions  left hip, including re-treatment of the left iliac lesion 30 Gy in 10 fractions   Beams/energy:    3D conformal with ant, post and DCA 3D conformal with ant, post, post RF and post oblique  Narrative: The patient tolerated radiation treatment relatively well.     Plan: The patient has completed radiation treatment. The patient will return to radiation oncology clinic for routine followup in one month. I advised him to call or return sooner if he has any questions or concerns related to his recovery or treatment. ________________________________  Sheral Apley. Tammi Klippel, M.D.

## 2021-04-10 ENCOUNTER — Inpatient Hospital Stay: Payer: BC Managed Care – PPO

## 2021-04-10 ENCOUNTER — Other Ambulatory Visit (HOSPITAL_BASED_OUTPATIENT_CLINIC_OR_DEPARTMENT_OTHER): Payer: Self-pay

## 2021-04-10 ENCOUNTER — Encounter: Payer: Self-pay | Admitting: Family

## 2021-04-10 ENCOUNTER — Other Ambulatory Visit: Payer: Self-pay

## 2021-04-10 ENCOUNTER — Encounter: Payer: Self-pay | Admitting: Hematology & Oncology

## 2021-04-10 ENCOUNTER — Other Ambulatory Visit: Payer: Self-pay | Admitting: Hematology & Oncology

## 2021-04-10 VITALS — BP 109/77 | HR 83 | Temp 98.1°F | Resp 18

## 2021-04-10 DIAGNOSIS — C799 Secondary malignant neoplasm of unspecified site: Secondary | ICD-10-CM

## 2021-04-10 DIAGNOSIS — M546 Pain in thoracic spine: Secondary | ICD-10-CM

## 2021-04-10 DIAGNOSIS — R110 Nausea: Secondary | ICD-10-CM

## 2021-04-10 DIAGNOSIS — C73 Malignant neoplasm of thyroid gland: Secondary | ICD-10-CM

## 2021-04-10 DIAGNOSIS — D509 Iron deficiency anemia, unspecified: Secondary | ICD-10-CM

## 2021-04-10 DIAGNOSIS — Z5111 Encounter for antineoplastic chemotherapy: Secondary | ICD-10-CM | POA: Diagnosis not present

## 2021-04-10 DIAGNOSIS — C787 Secondary malignant neoplasm of liver and intrahepatic bile duct: Secondary | ICD-10-CM

## 2021-04-10 LAB — COMPREHENSIVE METABOLIC PANEL
ALT: 31 U/L (ref 0–44)
AST: 33 U/L (ref 15–41)
Albumin: 4 g/dL (ref 3.5–5.0)
Alkaline Phosphatase: 133 U/L — ABNORMAL HIGH (ref 38–126)
Anion gap: 9 (ref 5–15)
BUN: 16 mg/dL (ref 6–20)
CO2: 27 mmol/L (ref 22–32)
Calcium: 9.7 mg/dL (ref 8.9–10.3)
Chloride: 98 mmol/L (ref 98–111)
Creatinine, Ser: 1.01 mg/dL (ref 0.61–1.24)
GFR, Estimated: >60 mL/min (ref >60)
Glucose, Bld: 124 mg/dL — ABNORMAL HIGH (ref 70–99)
Potassium: 4.5 mmol/L (ref 3.5–5.1)
Sodium: 134 mmol/L — ABNORMAL LOW (ref 135–145)
Total Bilirubin: 0.5 mg/dL (ref 0.3–1.2)
Total Protein: 6.8 g/dL (ref 6.5–8.1)

## 2021-04-10 LAB — CBC WITH DIFFERENTIAL (CANCER CENTER ONLY)
Abs Immature Granulocytes: 0.03 10*3/uL (ref 0.00–0.07)
Basophils Absolute: 0.1 10*3/uL (ref 0.0–0.1)
Basophils Relative: 2 %
Eosinophils Absolute: 0.1 10*3/uL (ref 0.0–0.5)
Eosinophils Relative: 2 %
HCT: 32.9 % — ABNORMAL LOW (ref 39.0–52.0)
Hemoglobin: 10.6 g/dL — ABNORMAL LOW (ref 13.0–17.0)
Immature Granulocytes: 1 %
Lymphocytes Relative: 16 %
Lymphs Abs: 0.6 10*3/uL — ABNORMAL LOW (ref 0.7–4.0)
MCH: 27.8 pg (ref 26.0–34.0)
MCHC: 32.2 g/dL (ref 30.0–36.0)
MCV: 86.4 fL (ref 80.0–100.0)
Monocytes Absolute: 0.5 10*3/uL (ref 0.1–1.0)
Monocytes Relative: 13 %
Neutro Abs: 2.3 10*3/uL (ref 1.7–7.7)
Neutrophils Relative %: 66 %
Platelet Count: 330 10*3/uL (ref 150–400)
RBC: 3.81 MIL/uL — ABNORMAL LOW (ref 4.22–5.81)
RDW: 18.1 % — ABNORMAL HIGH (ref 11.5–15.5)
WBC Count: 3.5 10*3/uL — ABNORMAL LOW (ref 4.0–10.5)
nRBC: 0 % (ref 0.0–0.2)

## 2021-04-10 MED ORDER — TEMAZEPAM 30 MG PO CAPS
30.0000 mg | ORAL_CAPSULE | Freq: Every evening | ORAL | 0 refills | Status: DC | PRN
  Filled 2021-04-10: qty 30, 30d supply, fill #0

## 2021-04-10 MED ORDER — SODIUM CHLORIDE 0.9% FLUSH
10.0000 mL | INTRAVENOUS | Status: DC | PRN
  Administered 2021-04-10: 10 mL

## 2021-04-10 MED ORDER — HOT PACK MISC ONCOLOGY: 1.0000 | Freq: Once | Status: DC | PRN

## 2021-04-10 MED ORDER — HEPARIN SOD (PORK) LOCK FLUSH 100 UNIT/ML IV SOLN
500.0000 [IU] | Freq: Once | INTRAVENOUS | Status: AC | PRN
  Administered 2021-04-10: 500 [IU]

## 2021-04-10 MED ORDER — SODIUM CHLORIDE 0.9 % IV SOLN: Freq: Once | INTRAVENOUS | Status: AC

## 2021-04-10 MED ORDER — HYDROMORPHONE HCL 8 MG PO TABS
8.0000 mg | ORAL_TABLET | ORAL | 0 refills | Status: DC | PRN
  Filled 2021-04-10: qty 120, 20d supply, fill #0

## 2021-04-10 MED ORDER — PROCHLORPERAZINE MALEATE 10 MG PO TABS
10.0000 mg | ORAL_TABLET | Freq: Once | ORAL | Status: AC
  Administered 2021-04-10: 10 mg via ORAL
  Filled 2021-04-10: qty 1

## 2021-04-10 MED ORDER — TESTOSTERONE CYPIONATE 200 MG/ML IM SOLN
400.0000 mg | INTRAMUSCULAR | Status: DC
  Administered 2021-04-10: 400 mg via INTRAMUSCULAR
  Filled 2021-04-10: qty 2

## 2021-04-10 MED ORDER — VINBLASTINE SULFATE CHEMO INJECTION 1 MG/ML
6.1700 mg/m2 | Freq: Once | INTRAVENOUS | Status: AC
  Administered 2021-04-10: 15 mg via INTRAVENOUS
  Filled 2021-04-10: qty 15

## 2021-04-10 NOTE — Patient Instructions (Signed)
[facility]  Discharge Instructions: Thank you for choosing [facility] to provide your oncology and hematology care.   If you have a lab appointment with the [facility], please go directly to the [facility] and check in at the registration area.  Wear comfortable clothing and clothing appropriate for easy access to any Portacath or PICC line.   We strive to give you quality time with your provider. You may need to reschedule your appointment if you arrive late (15 or more minutes).  Arriving late affects you and other patients whose appointments are after yours.  Also, if you miss three or more appointments without notifying the office, you may be dismissed from the clinic at the providers discretion.      For prescription refill requests, have your pharmacy contact our office and allow 72 hours for refills to be completed.    Today you received the following chemotherapy and/or immunotherapy agents Vinblastine.      To help prevent nausea and vomiting after your treatment, we encourage you to take your nausea medication as directed.  BELOW ARE SYMPTOMS THAT SHOULD BE REPORTED IMMEDIATELY: *FEVER GREATER THAN 100.4 F (38 C) OR HIGHER *CHILLS OR SWEATING *NAUSEA AND VOMITING THAT IS NOT CONTROLLED WITH YOUR NAUSEA MEDICATION *UNUSUAL SHORTNESS OF BREATH *UNUSUAL BRUISING OR BLEEDING *URINARY PROBLEMS (pain or burning when urinating, or frequent urination) *BOWEL PROBLEMS (unusual diarrhea, constipation, pain near the anus) TENDERNESS IN MOUTH AND THROAT WITH OR WITHOUT PRESENCE OF ULCERS (sore throat, sores in mouth, or a toothache) UNUSUAL RASH, SWELLING OR PAIN  UNUSUAL VAGINAL DISCHARGE OR ITCHING   Items with * indicate a potential emergency and should be followed up as soon as possible or go to the Emergency Department if any problems should occur.  Please show the CHEMOTHERAPY ALERT CARD or IMMUNOTHERAPY ALERT CARD at check-in to  the Emergency Department and triage nurse. Should you have questions after your visit or need to cancel or reschedule your appointment, please contact [facility]  [phone] and follow the prompts.  Office hours are 8:00 a.m. to 4:30 p.m. Monday - Friday. Please note that voicemails left after 4:00 p.m. may not be returned until the following business day.  We are closed weekends and major holidays. You have access to a nurse at all times for urgent questions. Please call the main number to the clinic [phone] and follow the prompts.  For any non-urgent questions, you may also contact your provider using MyChart. We now offer e-Visits for anyone 26 and older to request care online for non-urgent symptoms. For details visit [email or web address].   Also download the MyChart app! Go to the app store, search "MyChart", open the app, select [facility], and log in with your MyChart username and password.  Due to Covid, a mask is required upon entering the hospital/clinic. If you do not have a mask, one will be given to you upon arrival. For doctor visits, patients may have 1 support person aged 46 or older with them. For treatment visits, patients cannot have anyone with them due to current Covid guidelines and our immunocompromised population.

## 2021-04-11 ENCOUNTER — Other Ambulatory Visit (HOSPITAL_BASED_OUTPATIENT_CLINIC_OR_DEPARTMENT_OTHER): Payer: Self-pay

## 2021-04-14 ENCOUNTER — Other Ambulatory Visit (HOSPITAL_BASED_OUTPATIENT_CLINIC_OR_DEPARTMENT_OTHER): Payer: Self-pay

## 2021-04-14 ENCOUNTER — Other Ambulatory Visit: Payer: Self-pay | Admitting: Hematology & Oncology

## 2021-04-14 ENCOUNTER — Encounter: Payer: Self-pay | Admitting: Urology

## 2021-04-14 DIAGNOSIS — R110 Nausea: Secondary | ICD-10-CM

## 2021-04-14 DIAGNOSIS — C73 Malignant neoplasm of thyroid gland: Secondary | ICD-10-CM

## 2021-04-14 DIAGNOSIS — C799 Secondary malignant neoplasm of unspecified site: Secondary | ICD-10-CM

## 2021-04-14 MED ORDER — LORAZEPAM 1 MG PO TABS
1.0000 mg | ORAL_TABLET | Freq: Four times a day (QID) | ORAL | 0 refills | Status: DC | PRN
  Filled 2021-04-14: qty 30, 8d supply, fill #0

## 2021-04-14 MED ORDER — METHYLPHENIDATE HCL 10 MG PO TABS
10.0000 mg | ORAL_TABLET | Freq: Two times a day (BID) | ORAL | 0 refills | Status: DC
  Filled 2021-04-14: qty 60, 30d supply, fill #0

## 2021-04-14 NOTE — Progress Notes (Signed)
Spoke w/ patient, verified identity, and began nursing interview. Patient is doing well and denies any issues in relation to his thyroid at this time. ? ?Meaningful use complete. ? ?Patient reminded if his 11:00am-04/16/21 telephone appointment w/ Ashlyn Bruning PA-C. I left my extension [phone] in case patient needs to call. Patient verbalized understanding of information. ? ?Patient contact [phone] ?

## 2021-04-15 ENCOUNTER — Other Ambulatory Visit (HOSPITAL_BASED_OUTPATIENT_CLINIC_OR_DEPARTMENT_OTHER): Payer: Self-pay

## 2021-04-15 ENCOUNTER — Encounter (HOSPITAL_COMMUNITY): Payer: Self-pay | Admitting: Emergency Medicine

## 2021-04-15 NOTE — Progress Notes (Signed)
PA approved for methylphenidate 10 mg by [employer] through 04/14/21-04/14/24.

## 2021-04-16 ENCOUNTER — Other Ambulatory Visit: Payer: Self-pay

## 2021-04-16 ENCOUNTER — Ambulatory Visit
Admission: RE | Admit: 2021-04-16 | Discharge: 2021-04-16 | Disposition: A | Discharge status: Home / Self Care | Payer: BC Managed Care – PPO | Source: Ambulatory Visit | Attending: Urology | Admitting: Urology

## 2021-04-16 DIAGNOSIS — C73 Malignant neoplasm of thyroid gland: Secondary | ICD-10-CM

## 2021-04-16 DIAGNOSIS — C799 Secondary malignant neoplasm of unspecified site: Secondary | ICD-10-CM

## 2021-04-16 NOTE — Progress Notes (Signed)
Radiation Oncology         (336) [phone] ________________________________  Name: Brian [name] MRN: [id number]  Date: 04/16/2021  DOB: [birth year]  Post Treatment Note  CC: Sharyne Richters, MD  [name], MD  Diagnosis:   46 y.o. 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  Interval Since Last Radiation:  5.5 weeks  02/24/21-03/07/21:   T4-T6  30 Gy in 10 fractions  left hip, including re-treatment of the left iliac lesion treated to 30 Gy in 10 fractions   07/16/20 - 07/29/20: The target in the left hip/ilium was treated to 30 Gy in 10 fractions.   04/15/20 - 04/26/20:  The liver nodule and portacaval lymph node were treated to a prescription dose of 30 Gy in 10 fractions of 3 Gy each.   Narrative: I spoke with the patient to conduct his routine scheduled 1 month follow up visit via telephone to spare the patient unnecessary potential exposure in the healthcare setting during the current COVID-19 pandemic.  The patient was notified in advance and gave permission to proceed with this visit format.  He tolerated the treatment relatively well without any significant adverse side effects.  Midthoracic pain did increase during his treatments initially but was beginning to improve towards the end of treatments and the left hip pain was improving as well.                              On review of systems, the patient states that he is doing well in general.  He reports significant improvement in the left hip pain and thoracic spine pain, both of which are now tolerable and allowing him to be more active comfortably.  He has continued to tolerate the weekly systemic therapy with single agent vinblastine surprisingly well and will have repeat, restaging PET scan in March 2023, after completion of his eighth treatment and prior to his next scheduled follow-up visit with Dr. Marin Olp on 05/08/2021.  Overall, he is pleased with his progress to  date.  ALLERGIES:  is allergic to dextromethorphan-guaifenesin, doxycycline, gadobutrol, gadolinium derivatives, guaifenesin, ibuprofen, lisinopril, pantoprazole, tramadol, barium, and chlorhexidine.  Meds: Current Outpatient Medications  Medication Sig Dispense Refill   apixaban (ELIQUIS) 5 MG TABS tablet TAKE 1 TABLET (5 MG TOTAL) BY MOUTH 2 (TWO) TIMES DAILY. 60 tablet 6   cyclobenzaprine (FLEXERIL) 10 MG tablet Take 1 tablet (10 mg total) by mouth 3 (three) times daily as needed. 90 tablet 1   diphenhydrAMINE (BENADRYL) 25 mg capsule TAKE 1 CAPSULE BY MOUTH 1 HOUR BEFORE CT SCAN (Patient not taking: Reported on 11/04/2020) 30 capsule 0   EPINEPHrine (EPIPEN 2-PAK) 0.3 mg/0.3 mL IJ SOAJ injection USE AS DIRECTED FOR LIFE THREATENING ALLERGIC REACTIONS (Patient not taking: Reported on 04/03/2021) 4 each 3   famotidine (PEPCID) 40 MG tablet Take 1 tablet (40 mg total) by mouth 2 (two) times daily. 60 tablet 4   fentaNYL (DURAGESIC) 50 MCG/HR Place 1 patch onto the skin every 3 (three) days. (Patient not taking: Reported on 04/03/2021) 10 patch 0   HYDROmorphone (DILAUDID) 8 MG tablet Take 1 tablet (8 mg total) by mouth every 4 (four) hours as needed for severe pain. 120 tablet 0   Iron-FA-B Cmp-C-Biot-Probiotic (FUSION PLUS) CAPS Take 1 capsule by mouth daily. 30 capsule 6   levothyroxine (SYNTHROID) 200 MCG tablet Take one tablet (200 mcg dose) by mouth  daily. Total:  288 mcg/day. 90 tablet 3   LORazepam (ATIVAN) 1 MG tablet Take 1 tablet (1 mg total) by mouth every 6 (six) hours as needed for anxiety. 30 tablet 0   methylphenidate (RITALIN) 10 MG tablet Take 1 tablet (10 mg total) by mouth 2 (two) times daily. 60 tablet 0   metoCLOPramide (REGLAN) 10 MG tablet Take 1 tablet (10 mg total) by mouth every 6 (six) hours as needed for nausea or vomiting. 30 tablet 6   metoprolol tartrate (LOPRESSOR) 50 MG tablet Take 1 tablet (50 mg total) by mouth 2 (two) times daily. 60 tablet 4   Multiple Vitamin  (MULTIVITAMIN) capsule Take 1 capsule by mouth daily.     OLANZapine (ZYPREXA) 10 MG tablet TAKE 1 TABLET (10 MG TOTAL) BY MOUTH AT BEDTIME. 30 tablet 4   oxyCODONE ER (XTAMPZA ER) 36 MG C12A Take 1 capsule (36 mg total) by mouth every 12 (twelve) hours 60 capsule 0   polyethylene glycol (MIRALAX / GLYCOLAX) 17 g packet Take 17 g by mouth daily as needed for mild constipation. 14 each 0   potassium chloride SA (KLOR-CON M) 20 MEQ tablet Take 1 tablet (20 mEq total) by mouth daily. 30 tablet 5   prochlorperazine (COMPAZINE) 10 MG tablet Take 1 tablet (10 mg total) by mouth every 6 (six) hours as needed (Nausea or vomiting). 30 tablet 3   promethazine (PHENERGAN) 25 MG tablet TAKE 1 TABLET BY MOUTH EVERY 6 HOURS AS NEEDED FOR NAUSEA & VOMITING 120 tablet 0   senna-docusate (SENOKOT-S) 8.6-50 MG tablet Take 1 tablet by mouth at bedtime as needed for mild constipation. 30 tablet 0   temazepam (RESTORIL) 30 MG capsule Take 1 capsule (30 mg total) by mouth at bedtime as needed for sleep. 30 capsule 0   No current facility-administered medications for this encounter.   Facility-Administered Medications Ordered in Other Encounters  Medication Dose Route Frequency Provider Last Rate Last Admin   HYDROmorphone (DILAUDID) injection 4 mg  4 mg Intravenous Once [name], MD        Physical Findings:  vitals were not taken for this visit.  Pain Assessment Pain Score: 0-No pain/10 Unable to assess due to telephone follow-up visit format.  Lab Findings: Lab Results  Component Value Date   WBC 3.5 (L) 04/10/2021   HGB 10.6 (L) 04/10/2021   HCT 32.9 (L) 04/10/2021   MCV 86.4 04/10/2021   PLT 330 04/10/2021     Radiographic Findings: ECHOCARDIOGRAM COMPLETE  Result Date: 03/25/2021    ECHOCARDIOGRAM REPORT   Patient Name:   Brian [name]  Date of Exam: 03/25/2021 Medical Rec #:  [id number]     Height:       70.0 in Accession #:    [id number]    Weight:       265.0 lb Date of Birth:  [birth year]      BSA:          2.353 m Patient Age:    30 years      BP:           124/90 mmHg Patient Gender: M             HR:           111 bpm. Exam Location:  [address] Procedure: 2D Echo, Cardiac Doppler and Color Doppler Indications:    R00.0 Tachycardia.  History:        Patient has prior history of Echocardiogram examinations, most  recent 02/09/2020. CHF, DVT, Signs/Symptoms:Shortness of Breath;                 Risk Factors:Hypertension and Obesity. Pulmonary embolus.                 Thyroid cancer. Liver metastasis.  Sonographer:    Basilia Jumbo BS, RDCS Referring Phys: [facility]  1. Left ventricular ejection fraction, by estimation, is 50 to 55%. The left ventricle has low normal function. The left ventricle has no regional wall motion abnormalities. Left ventricular diastolic parameters are consistent with Grade I diastolic dysfunction (impaired relaxation).  2. Right ventricular systolic function is normal. The right ventricular size is normal. Tricuspid regurgitation signal is inadequate for assessing PA pressure.  3. The mitral valve is normal in structure. No evidence of mitral valve regurgitation. No evidence of mitral stenosis.  4. The aortic valve is tricuspid. Aortic valve regurgitation is trivial. Aortic valve sclerosis is present, with no evidence of aortic valve stenosis.  5. The inferior vena cava is normal in size with greater than 50% respiratory variability, suggesting right atrial pressure of 3 mmHg. Comparison(s): 02/09/20 EF 50-55%. FINDINGS  Left Ventricle: Left ventricular ejection fraction, by estimation, is 50 to 55%. The left ventricle has low normal function. The left ventricle has no regional wall motion abnormalities. The left ventricular internal cavity size was normal in size. There is no left ventricular hypertrophy. Left ventricular diastolic parameters are consistent with Grade I diastolic dysfunction (impaired relaxation). Right Ventricle: The  right ventricular size is normal. No increase in right ventricular wall thickness. Right ventricular systolic function is normal. Tricuspid regurgitation signal is inadequate for assessing PA pressure. Left Atrium: Left atrial size was normal in size. Right Atrium: Right atrial size was normal in size. Pericardium: There is no evidence of pericardial effusion. Mitral Valve: The mitral valve is normal in structure. No evidence of mitral valve regurgitation. No evidence of mitral valve stenosis. Tricuspid Valve: The tricuspid valve is normal in structure. Tricuspid valve regurgitation is not demonstrated. No evidence of tricuspid stenosis. Aortic Valve: The aortic valve is tricuspid. Aortic valve regurgitation is trivial. Aortic valve sclerosis is present, with no evidence of aortic valve stenosis. Pulmonic Valve: The pulmonic valve was not well visualized. Pulmonic valve regurgitation is trivial. No evidence of pulmonic stenosis. Aorta: The aortic root is normal in size and structure. Venous: The inferior vena cava is normal in size with greater than 50% respiratory variability, suggesting right atrial pressure of 3 mmHg. IAS/Shunts: No atrial level shunt detected by color flow Doppler.  LEFT VENTRICLE PLAX 2D LVIDd:         4.20 cm   Diastology LVIDs:         3.10 cm   LV e' medial:    10.00 cm/s LV PW:         1.00 cm   LV E/e' medial:  4.9 LV IVS:        1.00 cm   LV e' lateral:   10.00 cm/s LVOT diam:     2.50 cm   LV E/e' lateral: 4.9 LV SV:         56 LV SV Index:   24 LVOT Area:     4.91 cm  RIGHT VENTRICLE             IVC RV Basal diam:  3.30 cm     IVC diam: 1.80 cm RV S prime:     13.20 cm/s TAPSE (  M-mode): 2.2 cm LEFT ATRIUM             Index        RIGHT ATRIUM           Index LA diam:        3.00 cm 1.28 cm/m   RA Pressure: 3.00 mmHg LA Vol (A2C):   22.3 ml 9.48 ml/m   RA Area:     13.60 cm LA Vol (A4C):   23.9 ml 10.16 ml/m  RA Volume:   32.90 ml  13.98 ml/m LA Biplane Vol: 24.0 ml 10.20 ml/m   AORTIC VALVE LVOT Vmax:   84.13 cm/s LVOT Vmean:  56.233 cm/s LVOT VTI:    0.114 m  AORTA Ao Root diam: 3.70 cm Ao Asc diam:  3.60 cm MITRAL VALVE               TRICUSPID VALVE                            Estimated RAP:  3.00 mmHg MV Decel Time: 127 msec MV E velocity: 48.80 cm/s  SHUNTS MV A velocity: 69.40 cm/s  Systemic VTI:  0.11 m MV E/A ratio:  0.70        Systemic Diam: 2.50 cm Kardie Tobb DO Electronically signed by Berniece Salines DO Signature Date/Time: 03/25/2021/5:46:09 PM    Final     Impression/Plan: 1. 46 y.o. 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 He appears to have recovered well from the effects of his recent palliative radiation to the left hip and thoracic spine and is currently without complaints.  He is tolerating the systemic therapy well and is scheduled for restaging PET scan in March 2023, after completion of his eighth treatment and prior to his next scheduled follow-up visit with Dr. Marin Olp on 05/08/2021.  We discussed that while we are happy to continue to participate in his care if clinically indicated, at this point, we will plan to see him back on an as-needed basis.  He will continue in routine follow-up under the care and direction of Dr. Marin Olp for continued management of his systemic disease.  He knows that he is welcome to call at anytime with any questions or concerns related to his previous radiation but otherwise, we will plan to continue to follow his progress via correspondence.    Nicholos Johns, PA-C

## 2021-04-17 ENCOUNTER — Other Ambulatory Visit: Payer: Self-pay

## 2021-04-17 ENCOUNTER — Inpatient Hospital Stay: Payer: BC Managed Care – PPO

## 2021-04-17 ENCOUNTER — Inpatient Hospital Stay: Payer: BC Managed Care – PPO | Attending: Hematology & Oncology

## 2021-04-17 VITALS — BP 108/71 | HR 75 | Temp 98.6°F | Resp 17

## 2021-04-17 DIAGNOSIS — Z5111 Encounter for antineoplastic chemotherapy: Secondary | ICD-10-CM | POA: Insufficient documentation

## 2021-04-17 DIAGNOSIS — C73 Malignant neoplasm of thyroid gland: Secondary | ICD-10-CM

## 2021-04-17 DIAGNOSIS — E291 Testicular hypofunction: Secondary | ICD-10-CM | POA: Insufficient documentation

## 2021-04-17 DIAGNOSIS — C799 Secondary malignant neoplasm of unspecified site: Secondary | ICD-10-CM

## 2021-04-17 DIAGNOSIS — Z79899 Other long term (current) drug therapy: Secondary | ICD-10-CM | POA: Insufficient documentation

## 2021-04-17 DIAGNOSIS — C787 Secondary malignant neoplasm of liver and intrahepatic bile duct: Secondary | ICD-10-CM | POA: Insufficient documentation

## 2021-04-17 DIAGNOSIS — G893 Neoplasm related pain (acute) (chronic): Secondary | ICD-10-CM

## 2021-04-17 LAB — CMP (CANCER CENTER ONLY)
ALT: 23 U/L (ref 0–44)
AST: 30 U/L (ref 15–41)
Albumin: 3.5 g/dL (ref 3.5–5.0)
Alkaline Phosphatase: 133 U/L — ABNORMAL HIGH (ref 38–126)
Anion gap: 7 (ref 5–15)
BUN: 12 mg/dL (ref 6–20)
CO2: 29 mmol/L (ref 22–32)
Calcium: 9 mg/dL (ref 8.9–10.3)
Chloride: 104 mmol/L (ref 98–111)
Creatinine: 0.85 mg/dL (ref 0.61–1.24)
GFR, Estimated: >60 mL/min (ref >60)
Glucose, Bld: 118 mg/dL — ABNORMAL HIGH (ref 70–99)
Potassium: 4 mmol/L (ref 3.5–5.1)
Sodium: 140 mmol/L (ref 135–145)
Total Bilirubin: 0.4 mg/dL (ref 0.3–1.2)
Total Protein: 5.8 g/dL — ABNORMAL LOW (ref 6.5–8.1)

## 2021-04-17 LAB — CBC WITH DIFFERENTIAL (CANCER CENTER ONLY)
Abs Immature Granulocytes: 0.02 10*3/uL (ref 0.00–0.07)
Basophils Absolute: 0 10*3/uL (ref 0.0–0.1)
Basophils Relative: 1 %
Eosinophils Absolute: 0 10*3/uL (ref 0.0–0.5)
Eosinophils Relative: 2 %
HCT: 28.1 % — ABNORMAL LOW (ref 39.0–52.0)
Hemoglobin: 8.8 g/dL — ABNORMAL LOW (ref 13.0–17.0)
Immature Granulocytes: 1 %
Lymphocytes Relative: 20 %
Lymphs Abs: 0.3 10*3/uL — ABNORMAL LOW (ref 0.7–4.0)
MCH: 27.8 pg (ref 26.0–34.0)
MCHC: 31.3 g/dL (ref 30.0–36.0)
MCV: 88.9 fL (ref 80.0–100.0)
Monocytes Absolute: 0.2 10*3/uL (ref 0.1–1.0)
Monocytes Relative: 10 %
Neutro Abs: 1.1 10*3/uL — ABNORMAL LOW (ref 1.7–7.7)
Neutrophils Relative %: 66 %
Platelet Count: 266 10*3/uL (ref 150–400)
RBC: 3.16 MIL/uL — ABNORMAL LOW (ref 4.22–5.81)
RDW: 17.9 % — ABNORMAL HIGH (ref 11.5–15.5)
Smear Review: NORMAL
WBC Count: 1.7 10*3/uL — ABNORMAL LOW (ref 4.0–10.5)
nRBC: 0 % (ref 0.0–0.2)

## 2021-04-17 MED ORDER — SODIUM CHLORIDE 0.9% FLUSH
10.0000 mL | INTRAVENOUS | Status: DC | PRN
  Administered 2021-04-17: 10 mL

## 2021-04-17 MED ORDER — HYDROMORPHONE HCL 4 MG/ML IJ SOLN
4.0000 mg | INTRAMUSCULAR | Status: DC | PRN
  Administered 2021-04-17: 4 mg via INTRAVENOUS
  Filled 2021-04-17: qty 1

## 2021-04-17 MED ORDER — PROCHLORPERAZINE MALEATE 10 MG PO TABS: 10.0000 mg | ORAL_TABLET | Freq: Once | ORAL | Status: DC

## 2021-04-17 MED ORDER — SODIUM CHLORIDE 0.9 % IV SOLN: Freq: Once | INTRAVENOUS | Status: AC

## 2021-04-17 MED ORDER — HEPARIN SOD (PORK) LOCK FLUSH 100 UNIT/ML IV SOLN
500.0000 [IU] | Freq: Once | INTRAVENOUS | Status: AC | PRN
  Administered 2021-04-17: 500 [IU]

## 2021-04-17 MED ORDER — VINBLASTINE SULFATE CHEMO INJECTION 1 MG/ML
6.1700 mg/m2 | Freq: Once | INTRAVENOUS | Status: AC
  Administered 2021-04-17: 15 mg via INTRAVENOUS
  Filled 2021-04-17: qty 15

## 2021-04-17 NOTE — Patient Instructions (Signed)

## 2021-04-17 NOTE — Progress Notes (Signed)
Ok to treat with WBC 1.7 & ANC 1.1 per Dr Marin Olp. dph ?

## 2021-04-17 NOTE — Patient Instructions (Signed)
[facility]  Discharge Instructions: ?Thank you for choosing [facility] to provide your oncology and hematology care.  ? ?If you have a lab appointment with the [facility], please go directly to the [facility] and check in at the registration area. ? ?Wear comfortable clothing and clothing appropriate for easy access to any Portacath or PICC line.  ? ?We strive to give you quality time with your provider. You may need to reschedule your appointment if you arrive late (15 or more minutes).  Arriving late affects you and other patients whose appointments are after yours.  Also, if you miss three or more appointments without notifying the office, you may be dismissed from the clinic at the provider?s discretion.    ?  ?For prescription refill requests, have your pharmacy contact our office and allow 72 hours for refills to be completed.   ? ?Today you received the following chemotherapy and/or immunotherapy agents Vinblastine.    ?  ?To help prevent nausea and vomiting after your treatment, we encourage you to take your nausea medication as directed. ? ?BELOW ARE SYMPTOMS THAT SHOULD BE REPORTED IMMEDIATELY: ?*FEVER GREATER THAN 100.4 F (38 ?C) OR HIGHER ?*CHILLS OR SWEATING ?*NAUSEA AND VOMITING THAT IS NOT CONTROLLED WITH YOUR NAUSEA MEDICATION ?*UNUSUAL SHORTNESS OF BREATH ?*UNUSUAL BRUISING OR BLEEDING ?*URINARY PROBLEMS (pain or burning when urinating, or frequent urination) ?*BOWEL PROBLEMS (unusual diarrhea, constipation, pain near the anus) ?TENDERNESS IN MOUTH AND THROAT WITH OR WITHOUT PRESENCE OF ULCERS (sore throat, sores in mouth, or a toothache) ?UNUSUAL RASH, SWELLING OR PAIN  ?UNUSUAL VAGINAL DISCHARGE OR ITCHING  ? ?Items with * indicate a potential emergency and should be followed up as soon as possible or go to the Emergency Department if any problems should occur. ? ?Please show the CHEMOTHERAPY ALERT CARD or IMMUNOTHERAPY ALERT CARD at check-in to  the Emergency Department and triage nurse. ?Should you have questions after your visit or need to cancel or reschedule your appointment, please contact [facility]  [phone] and follow the prompts.  Office hours are 8:00 a.m. to 4:30 p.m. Monday - Friday. Please note that voicemails left after 4:00 p.m. may not be returned until the following business day.  We are closed weekends and major holidays. You have access to a nurse at all times for urgent questions. Please call the main number to the clinic [phone] and follow the prompts. ? ?For any non-urgent questions, you may also contact your provider using MyChart. We now offer e-Visits for anyone 77 and older to request care online for non-urgent symptoms. For details visit [email or web address]. ?  ?Also download the MyChart app! Go to the app store, search "MyChart", open the app, select [facility], and log in with your MyChart username and password. ? ?Due to Covid, a mask is required upon entering the hospital/clinic. If you do not have a mask, one will be given to you upon arrival. For doctor visits, patients may have 1 support person aged 9 or older with them. For treatment visits, patients cannot have anyone with them due to current Covid guidelines and our immunocompromised population.  ?

## 2021-04-23 ENCOUNTER — Other Ambulatory Visit: Payer: Self-pay

## 2021-04-23 DIAGNOSIS — C73 Malignant neoplasm of thyroid gland: Secondary | ICD-10-CM

## 2021-04-24 ENCOUNTER — Inpatient Hospital Stay: Payer: BC Managed Care – PPO

## 2021-04-24 ENCOUNTER — Telehealth: Payer: Self-pay | Admitting: *Deleted

## 2021-04-24 ENCOUNTER — Other Ambulatory Visit: Payer: Self-pay

## 2021-04-24 DIAGNOSIS — D509 Iron deficiency anemia, unspecified: Secondary | ICD-10-CM

## 2021-04-24 DIAGNOSIS — G893 Neoplasm related pain (acute) (chronic): Secondary | ICD-10-CM

## 2021-04-24 DIAGNOSIS — C73 Malignant neoplasm of thyroid gland: Secondary | ICD-10-CM

## 2021-04-24 DIAGNOSIS — Z5111 Encounter for antineoplastic chemotherapy: Secondary | ICD-10-CM | POA: Diagnosis not present

## 2021-04-24 LAB — CBC WITH DIFFERENTIAL (CANCER CENTER ONLY)
Abs Immature Granulocytes: 0.02 10*3/uL (ref 0.00–0.07)
Basophils Absolute: 0 10*3/uL (ref 0.0–0.1)
Basophils Relative: 2 %
Eosinophils Absolute: 0.1 10*3/uL (ref 0.0–0.5)
Eosinophils Relative: 6 %
HCT: 25.5 % — ABNORMAL LOW (ref 39.0–52.0)
Hemoglobin: 8.1 g/dL — ABNORMAL LOW (ref 13.0–17.0)
Immature Granulocytes: 2 %
Lymphocytes Relative: 29 %
Lymphs Abs: 0.3 10*3/uL — ABNORMAL LOW (ref 0.7–4.0)
MCH: 28.3 pg (ref 26.0–34.0)
MCHC: 31.8 g/dL (ref 30.0–36.0)
MCV: 89.2 fL (ref 80.0–100.0)
Monocytes Absolute: 0.2 10*3/uL (ref 0.1–1.0)
Monocytes Relative: 15 %
Neutro Abs: 0.5 10*3/uL — ABNORMAL LOW (ref 1.7–7.7)
Neutrophils Relative %: 46 %
Platelet Count: 232 10*3/uL (ref 150–400)
RBC: 2.86 MIL/uL — ABNORMAL LOW (ref 4.22–5.81)
RDW: 18.7 % — ABNORMAL HIGH (ref 11.5–15.5)
Smear Review: NORMAL
WBC Count: 1 10*3/uL — ABNORMAL LOW (ref 4.0–10.5)
nRBC: 6.7 % — ABNORMAL HIGH (ref 0.0–0.2)

## 2021-04-24 LAB — CMP (CANCER CENTER ONLY)
ALT: 22 U/L (ref 0–44)
AST: 28 U/L (ref 15–41)
Albumin: 3.5 g/dL (ref 3.5–5.0)
Alkaline Phosphatase: 114 U/L (ref 38–126)
Anion gap: 7 (ref 5–15)
BUN: 12 mg/dL (ref 6–20)
CO2: 28 mmol/L (ref 22–32)
Calcium: 8.3 mg/dL — ABNORMAL LOW (ref 8.9–10.3)
Chloride: 103 mmol/L (ref 98–111)
Creatinine: 0.91 mg/dL (ref 0.61–1.24)
GFR, Estimated: >60 mL/min (ref >60)
Glucose, Bld: 114 mg/dL — ABNORMAL HIGH (ref 70–99)
Potassium: 3.6 mmol/L (ref 3.5–5.1)
Sodium: 138 mmol/L (ref 135–145)
Total Bilirubin: 0.6 mg/dL (ref 0.3–1.2)
Total Protein: 5.6 g/dL — ABNORMAL LOW (ref 6.5–8.1)

## 2021-04-24 LAB — FERRITIN: Ferritin: 959 ng/mL — ABNORMAL HIGH (ref 24–336)

## 2021-04-24 LAB — IRON AND IRON BINDING CAPACITY (CC-WL,HP ONLY)
Iron: 61 ug/dL (ref 45–182)
Saturation Ratios: 27 % (ref 17.9–39.5)
TIBC: 228 ug/dL — ABNORMAL LOW (ref 250–450)
UIBC: 167 ug/dL (ref 117–376)

## 2021-04-24 LAB — SAMPLE TO BLOOD BANK

## 2021-04-24 LAB — PREPARE RBC (CROSSMATCH)

## 2021-04-24 MED ORDER — HEPARIN SOD (PORK) LOCK FLUSH 100 UNIT/ML IV SOLN
500.0000 [IU] | Freq: Once | INTRAVENOUS | Status: AC | PRN
  Administered 2021-04-24: 10:00:00 500 [IU]

## 2021-04-24 MED ORDER — TESTOSTERONE CYPIONATE 200 MG/ML IM SOLN
400.0000 mg | INTRAMUSCULAR | Status: DC
  Administered 2021-04-24: 10:00:00 400 mg via INTRAMUSCULAR
  Filled 2021-04-24: qty 2

## 2021-04-24 MED ORDER — SODIUM CHLORIDE 0.9% FLUSH
10.0000 mL | Freq: Once | INTRAVENOUS | Status: AC | PRN
  Administered 2021-04-24: 10:00:00 10 mL

## 2021-04-24 NOTE — Telephone Encounter (Signed)
Dr. Marin Olp notified of ANC-0.5.  Order received to hold chemo today, to give Testosterone injection as previously ordered today and to give pt two units of PRBC's.  Message sent to scheduling.  ? ?

## 2021-04-24 NOTE — Patient Instructions (Signed)
[facility] [place]  Discharge Instructions: ?Thank you for choosing [facility] to provide your oncology and hematology care.  ? ?If you have a lab appointment with the [facility], please go directly to the [facility] and check in at the registration area. ? ?Wear comfortable clothing and clothing appropriate for easy access to any Portacath or PICC line.  ? ?We strive to give you quality time with your provider. You may need to reschedule your appointment if you arrive late (15 or more minutes).  Arriving late affects you and other patients whose appointments are after yours.  Also, if you miss three or more appointments without notifying the office, you may be dismissed from the clinic at the provider?s discretion.    ?  ?For prescription refill requests, have your pharmacy contact our office and allow 72 hours for refills to be completed.   ? ?Today you received the following chemotherapy and/or immunotherapy agents testosterone   ?  ?To help prevent nausea and vomiting after your treatment, we encourage you to take your nausea medication as directed. ? ?BELOW ARE SYMPTOMS THAT SHOULD BE REPORTED IMMEDIATELY: ?*FEVER GREATER THAN 100.4 F (38 ?C) OR HIGHER ?*CHILLS OR SWEATING ?*NAUSEA AND VOMITING THAT IS NOT CONTROLLED WITH YOUR NAUSEA MEDICATION ?*UNUSUAL SHORTNESS OF BREATH ?*UNUSUAL BRUISING OR BLEEDING ?*URINARY PROBLEMS (pain or burning when urinating, or frequent urination) ?*BOWEL PROBLEMS (unusual diarrhea, constipation, pain near the anus) ?TENDERNESS IN MOUTH AND THROAT WITH OR WITHOUT PRESENCE OF ULCERS (sore throat, sores in mouth, or a toothache) ?UNUSUAL RASH, SWELLING OR PAIN  ?UNUSUAL VAGINAL DISCHARGE OR ITCHING  ? ?Items with * indicate a potential emergency and should be followed up as soon as possible or go to the Emergency Department if any problems should occur. ? ?Please show the CHEMOTHERAPY ALERT CARD or IMMUNOTHERAPY ALERT CARD at check-in to the  Emergency Department and triage nurse. ?Should you have questions after your visit or need to cancel or reschedule your appointment, please contact [facility]  [phone] and follow the prompts.  Office hours are 8:00 a.m. to 4:30 p.m. Monday - Friday. Please note that voicemails left after 4:00 p.m. may not be returned until the following business day.  We are closed weekends and major holidays. You have access to a nurse at all times for urgent questions. Please call the main number to the clinic [phone] and follow the prompts. ? ?For any non-urgent questions, you may also contact your provider using MyChart. We now offer e-Visits for anyone 2 and older to request care online for non-urgent symptoms. For details visit [email or web address]. ?  ?Also download the MyChart app! Go to the app store, search "MyChart", open the app, select [facility], and log in with your MyChart username and password. ? ?Due to Covid, a mask is required upon entering the hospital/clinic. If you do not have a mask, one will be given to you upon arrival. For doctor visits, patients may have 1 support person aged 58 or older with them. For treatment visits, patients cannot have anyone with them due to current Covid guidelines and our immunocompromised population.  ?

## 2021-04-25 ENCOUNTER — Encounter: Payer: Self-pay | Admitting: Hematology & Oncology

## 2021-04-25 ENCOUNTER — Inpatient Hospital Stay: Payer: BC Managed Care – PPO

## 2021-04-25 ENCOUNTER — Encounter: Payer: Self-pay | Admitting: Family

## 2021-04-25 ENCOUNTER — Other Ambulatory Visit (HOSPITAL_BASED_OUTPATIENT_CLINIC_OR_DEPARTMENT_OTHER): Payer: Self-pay

## 2021-04-25 ENCOUNTER — Other Ambulatory Visit: Payer: Self-pay | Admitting: Hematology & Oncology

## 2021-04-25 VITALS — BP 112/73 | HR 65 | Temp 97.8°F | Resp 17

## 2021-04-25 DIAGNOSIS — C73 Malignant neoplasm of thyroid gland: Secondary | ICD-10-CM

## 2021-04-25 DIAGNOSIS — C801 Malignant (primary) neoplasm, unspecified: Secondary | ICD-10-CM

## 2021-04-25 DIAGNOSIS — Z5111 Encounter for antineoplastic chemotherapy: Secondary | ICD-10-CM | POA: Diagnosis not present

## 2021-04-25 DIAGNOSIS — G893 Neoplasm related pain (acute) (chronic): Secondary | ICD-10-CM

## 2021-04-25 DIAGNOSIS — C799 Secondary malignant neoplasm of unspecified site: Secondary | ICD-10-CM

## 2021-04-25 DIAGNOSIS — D509 Iron deficiency anemia, unspecified: Secondary | ICD-10-CM

## 2021-04-25 MED ORDER — HEPARIN SOD (PORK) LOCK FLUSH 100 UNIT/ML IV SOLN
500.0000 [IU] | Freq: Every day | INTRAVENOUS | Status: AC | PRN
  Administered 2021-04-25: 500 [IU]

## 2021-04-25 MED ORDER — DIPHENHYDRAMINE HCL 25 MG PO CAPS
25.0000 mg | ORAL_CAPSULE | Freq: Once | ORAL | Status: AC
  Administered 2021-04-25: 25 mg via ORAL
  Filled 2021-04-25: qty 1

## 2021-04-25 MED ORDER — SODIUM CHLORIDE 0.9% FLUSH
10.0000 mL | INTRAVENOUS | Status: AC | PRN
  Administered 2021-04-25: 10 mL

## 2021-04-25 MED ORDER — SODIUM CHLORIDE 0.9% IV SOLUTION
250.0000 mL | Freq: Once | INTRAVENOUS | Status: AC
  Administered 2021-04-25: 250 mL via INTRAVENOUS

## 2021-04-25 MED ORDER — ACETAMINOPHEN 325 MG PO TABS
650.0000 mg | ORAL_TABLET | Freq: Once | ORAL | Status: AC
  Administered 2021-04-25: 650 mg via ORAL
  Filled 2021-04-25: qty 2

## 2021-04-25 MED ORDER — PREDNISONE 50 MG PO TABS
ORAL_TABLET | ORAL | 0 refills | Status: DC
  Filled 2021-04-25: qty 3, 1d supply, fill #0

## 2021-04-25 NOTE — Patient Instructions (Signed)
Blood Transfusion, Adult A blood transfusion is a procedure in which you receive blood or a type of blood cell (blood component) through an IV. You may need a blood transfusion when your blood level is low. This may result from a bleeding disorder, illness, injury, or surgery. The blood may come from a donor. You may also be able to donate blood for yourself (autologous blood donation) before a planned surgery. The blood given in a transfusion is made up of different blood components. You may receive: Red blood cells. These carry oxygen to the cells in the body. Platelets. These help your blood to clot. Plasma. This is the liquid part of your blood. It carries proteins and other substances throughout the body. White blood cells. These help you fight infections. If you have hemophilia or another clotting disorder, you may also receive other types of blood products. Tell a health care provider about: Any blood disorders you have. Any previous reactions you have had during a blood transfusion. Any allergies you have. All medicines you are taking, including vitamins, herbs, eye drops, creams, and over-the-counter medicines. Any surgeries you have had. Any medical conditions you have, including any recent fever or cold symptoms. Whether you are pregnant or may be pregnant. What are the risks? Generally, this is a safe procedure. However, problems may occur. The most common problems include: A mild allergic reaction, such as red, swollen areas of skin (hives) and itching. Fever or chills. This may be the body's response to new blood cells received. This may occur during or up to 4 hours after the transfusion. More serious problems may include: Transfusion-associated circulatory overload (TACO), or too much fluid in the lungs. This may cause breathing problems. A serious allergic reaction, such as difficulty breathing or swelling around the face and lips. Transfusion-related acute lung injury  (TRALI), which causes breathing difficulty and low oxygen in the blood. This can occur within hours of the transfusion or several days later. Iron overload. This can happen after receiving many blood transfusions over a period of time. Infection or virus being transmitted. This is rare because donated blood is carefully tested before it is given. Hemolytic transfusion reaction. This is rare. It happens when your body's defense system (immune system)tries to attack the new blood cells. Symptoms may include fever, chills, nausea, low blood pressure, and low back or chest pain. Transfusion-associated graft-versus-host disease (TAGVHD). This is rare. It happens when donated cells attack your body's healthy tissues. What happens before the procedure? Medicines Ask your health care provider about: Changing or stopping your regular medicines. This is especially important if you are taking diabetes medicines or blood thinners. Taking medicines such as aspirin and ibuprofen. These medicines can thin your blood. Do not take these medicines unless your health care provider tells you to take them. Taking over-the-counter medicines, vitamins, herbs, and supplements. General instructions Follow instructions from your health care provider about eating and drinking restrictions. You will have a blood test to determine your blood type. This is necessary to know what kind of blood your body will accept and to match it to the donor blood. If you are going to have a planned surgery, you may be able to do an autologous blood donation. This may be done in case you need to have a transfusion. You will have your temperature, blood pressure, and pulse monitored before the transfusion. If you have had an allergic reaction to a transfusion in the past, you may be given medicine to help prevent   a reaction. This medicine may be given to you by mouth (orally) or through an IV. Set aside time for the blood transfusion. This  procedure generally takes 1-4 hours to complete. What happens during the procedure?  An IV will be inserted into one of your veins. The bag of donated blood will be attached to your IV. The blood will then enter through your vein. Your temperature, blood pressure, and pulse will be monitored regularly during the transfusion. This monitoring is done to detect early signs of a transfusion reaction. Tell your nurse right away if you have any of these symptoms during the transfusion: Shortness of breath or trouble breathing. Chest or back pain. Fever or chills. Hives or itching. If you have any signs or symptoms of a reaction, your transfusion will be stopped and you may be given medicine. When the transfusion is complete, your IV will be removed. Pressure may be applied to the IV site for a few minutes. A bandage (dressing)will be applied. The procedure may vary among health care providers and hospitals. What happens after the procedure? Your temperature, blood pressure, pulse, breathing rate, and blood oxygen level will be monitored until you leave the hospital or clinic. Your blood may be tested to see how you are responding to the transfusion. You may be warmed with fluids or blankets to maintain a normal body temperature. If you receive your blood transfusion in an outpatient setting, you will be told whom to contact to report any reactions. Where to find more information For more information on blood transfusions, visit the American Red Cross: redcross.org Summary A blood transfusion is a procedure in which you receive blood or a type of blood cell (blood component) through an IV. The blood you receive may come from a donor or be donated by yourself (autologous blood donation) before a planned surgery. The blood given in a transfusion is made up of different blood components. You may receive red blood cells, platelets, plasma, or white blood cells depending on the condition treated. Your  temperature, blood pressure, and pulse will be monitored before, during, and after the transfusion. After the transfusion, your blood may be tested to see how your body has responded. This information is not intended to replace advice given to you by your health care provider. Make sure you discuss any questions you have with your health care provider. Document Revised: 12/08/2018 Document Reviewed: 07/28/2018 Elsevier Patient Education  2022 Elsevier Inc.  

## 2021-04-27 LAB — TYPE AND SCREEN
ABO/RH(D): O NEG
Antibody Screen: NEGATIVE
Unit division: 0
Unit division: 0

## 2021-04-27 LAB — BPAM RBC
Blood Product Expiration Date: 202303242359
Blood Product Expiration Date: 202303252359
ISSUE DATE / TIME: 202303100830
ISSUE DATE / TIME: 202303100830
Unit Type and Rh: 9500
Unit Type and Rh: 9500

## 2021-04-28 ENCOUNTER — Other Ambulatory Visit (HOSPITAL_BASED_OUTPATIENT_CLINIC_OR_DEPARTMENT_OTHER): Payer: Self-pay

## 2021-04-28 ENCOUNTER — Other Ambulatory Visit: Payer: Self-pay | Admitting: Hematology & Oncology

## 2021-04-28 DIAGNOSIS — M546 Pain in thoracic spine: Secondary | ICD-10-CM

## 2021-04-28 DIAGNOSIS — C73 Malignant neoplasm of thyroid gland: Secondary | ICD-10-CM

## 2021-04-28 DIAGNOSIS — C799 Secondary malignant neoplasm of unspecified site: Secondary | ICD-10-CM

## 2021-04-28 DIAGNOSIS — G893 Neoplasm related pain (acute) (chronic): Secondary | ICD-10-CM

## 2021-04-28 DIAGNOSIS — R110 Nausea: Secondary | ICD-10-CM

## 2021-04-28 DIAGNOSIS — C787 Secondary malignant neoplasm of liver and intrahepatic bile duct: Secondary | ICD-10-CM

## 2021-04-28 MED ORDER — LORAZEPAM 1 MG PO TABS
1.0000 mg | ORAL_TABLET | Freq: Four times a day (QID) | ORAL | 0 refills | Status: DC | PRN
  Filled 2021-04-28: qty 30, 8d supply, fill #0

## 2021-04-28 MED ORDER — XTAMPZA ER 36 MG PO C12A
36.0000 mg | EXTENDED_RELEASE_CAPSULE | Freq: Two times a day (BID) | ORAL | 0 refills | Status: DC
  Filled 2021-04-28 – 2021-05-07 (×2): qty 60, 30d supply, fill #0

## 2021-04-28 MED ORDER — CYCLOBENZAPRINE HCL 10 MG PO TABS
10.0000 mg | ORAL_TABLET | Freq: Three times a day (TID) | ORAL | 1 refills | Status: DC | PRN
  Filled 2021-04-28: qty 90, 30d supply, fill #0
  Filled 2021-06-25: qty 90, 30d supply, fill #1

## 2021-04-28 MED ORDER — TEMAZEPAM 30 MG PO CAPS
30.0000 mg | ORAL_CAPSULE | Freq: Every evening | ORAL | 0 refills | Status: DC | PRN
  Filled 2021-04-28 – 2021-05-07 (×2): qty 30, 30d supply, fill #0

## 2021-05-01 ENCOUNTER — Other Ambulatory Visit: Payer: BC Managed Care – PPO

## 2021-05-01 ENCOUNTER — Ambulatory Visit: Payer: BC Managed Care – PPO

## 2021-05-01 ENCOUNTER — Ambulatory Visit: Payer: BC Managed Care – PPO | Admitting: Hematology & Oncology

## 2021-05-05 ENCOUNTER — Emergency Department (HOSPITAL_BASED_OUTPATIENT_CLINIC_OR_DEPARTMENT_OTHER)
Admission: EM | Admit: 2021-05-05 | Discharge: 2021-05-05 | Disposition: A | Discharge status: Home / Self Care | Payer: BC Managed Care – PPO | Attending: Emergency Medicine | Admitting: Emergency Medicine

## 2021-05-05 ENCOUNTER — Emergency Department (HOSPITAL_BASED_OUTPATIENT_CLINIC_OR_DEPARTMENT_OTHER): Payer: BC Managed Care – PPO

## 2021-05-05 ENCOUNTER — Other Ambulatory Visit: Payer: Self-pay

## 2021-05-05 ENCOUNTER — Encounter (HOSPITAL_BASED_OUTPATIENT_CLINIC_OR_DEPARTMENT_OTHER): Payer: Self-pay | Admitting: *Deleted

## 2021-05-05 DIAGNOSIS — R509 Fever, unspecified: Secondary | ICD-10-CM | POA: Diagnosis not present

## 2021-05-05 DIAGNOSIS — I10 Essential (primary) hypertension: Secondary | ICD-10-CM | POA: Insufficient documentation

## 2021-05-05 DIAGNOSIS — M791 Myalgia, unspecified site: Secondary | ICD-10-CM | POA: Insufficient documentation

## 2021-05-05 DIAGNOSIS — Z85850 Personal history of malignant neoplasm of thyroid: Secondary | ICD-10-CM | POA: Insufficient documentation

## 2021-05-05 DIAGNOSIS — Z79899 Other long term (current) drug therapy: Secondary | ICD-10-CM | POA: Diagnosis not present

## 2021-05-05 DIAGNOSIS — Z20822 Contact with and (suspected) exposure to covid-19: Secondary | ICD-10-CM | POA: Insufficient documentation

## 2021-05-05 DIAGNOSIS — R52 Pain, unspecified: Secondary | ICD-10-CM

## 2021-05-05 DIAGNOSIS — Z7901 Long term (current) use of anticoagulants: Secondary | ICD-10-CM | POA: Insufficient documentation

## 2021-05-05 DIAGNOSIS — R000 Tachycardia, unspecified: Secondary | ICD-10-CM | POA: Diagnosis not present

## 2021-05-05 LAB — CBC WITH DIFFERENTIAL/PLATELET
Abs Immature Granulocytes: 0.01 10*3/uL (ref 0.00–0.07)
Basophils Absolute: 0 10*3/uL (ref 0.0–0.1)
Basophils Relative: 1 %
Eosinophils Absolute: 0 10*3/uL (ref 0.0–0.5)
Eosinophils Relative: 0 %
HCT: 39.4 % (ref 39.0–52.0)
Hemoglobin: 12.5 g/dL — ABNORMAL LOW (ref 13.0–17.0)
Immature Granulocytes: 0 %
Lymphocytes Relative: 9 %
Lymphs Abs: 0.3 10*3/uL — ABNORMAL LOW (ref 0.7–4.0)
MCH: 28.3 pg (ref 26.0–34.0)
MCHC: 31.7 g/dL (ref 30.0–36.0)
MCV: 89.3 fL (ref 80.0–100.0)
Monocytes Absolute: 0.7 10*3/uL (ref 0.1–1.0)
Monocytes Relative: 17 %
Neutro Abs: 2.9 10*3/uL (ref 1.7–7.7)
Neutrophils Relative %: 73 %
Platelets: 279 10*3/uL (ref 150–400)
RBC: 4.41 MIL/uL (ref 4.22–5.81)
RDW: 19.2 % — ABNORMAL HIGH (ref 11.5–15.5)
WBC: 3.9 10*3/uL — ABNORMAL LOW (ref 4.0–10.5)
nRBC: 0 % (ref 0.0–0.2)

## 2021-05-05 LAB — COMPREHENSIVE METABOLIC PANEL
ALT: 16 U/L (ref 0–44)
AST: 27 U/L (ref 15–41)
Albumin: 3.7 g/dL (ref 3.5–5.0)
Alkaline Phosphatase: 125 U/L (ref 38–126)
Anion gap: 9 (ref 5–15)
BUN: 13 mg/dL (ref 6–20)
CO2: 25 mmol/L (ref 22–32)
Calcium: 9.1 mg/dL (ref 8.9–10.3)
Chloride: 103 mmol/L (ref 98–111)
Creatinine, Ser: 0.85 mg/dL (ref 0.61–1.24)
GFR, Estimated: >60 mL/min (ref >60)
Glucose, Bld: 111 mg/dL — ABNORMAL HIGH (ref 70–99)
Potassium: 3.7 mmol/L (ref 3.5–5.1)
Sodium: 137 mmol/L (ref 135–145)
Total Bilirubin: 1 mg/dL (ref 0.3–1.2)
Total Protein: 6.7 g/dL (ref 6.5–8.1)

## 2021-05-05 LAB — RESP PANEL BY RT-PCR (FLU A&B, COVID) ARPGX2
Influenza A by PCR: NEGATIVE
Influenza B by PCR: NEGATIVE
SARS Coronavirus 2 by RT PCR: NEGATIVE

## 2021-05-05 LAB — LACTIC ACID, PLASMA: Lactic Acid, Venous: 1.2 mmol/L (ref 0.5–1.9)

## 2021-05-05 MED ORDER — SODIUM CHLORIDE 0.9 % IV BOLUS
500.0000 mL | Freq: Once | INTRAVENOUS | Status: AC
Start: 2021-05-05 — End: 2021-05-05
  Administered 2021-05-05: 500 mL via INTRAVENOUS

## 2021-05-05 MED ORDER — HYDROMORPHONE HCL 1 MG/ML IJ SOLN
1.0000 mg | INTRAMUSCULAR | Status: DC | PRN
  Administered 2021-05-05: 1 mg via INTRAVENOUS
  Filled 2021-05-05: qty 1

## 2021-05-05 MED ORDER — HEPARIN SOD (PORK) LOCK FLUSH 100 UNIT/ML IV SOLN
500.0000 [IU] | Freq: Once | INTRAVENOUS | Status: AC
  Administered 2021-05-05: 500 [IU]

## 2021-05-05 MED ORDER — HYDROMORPHONE HCL 1 MG/ML IJ SOLN
0.5000 mg | Freq: Once | INTRAMUSCULAR | Status: AC
  Administered 2021-05-05: 0.5 mg via INTRAVENOUS
  Filled 2021-05-05: qty 1

## 2021-05-05 MED ORDER — HEPARIN SOD (PORK) LOCK FLUSH 100 UNIT/ML IV SOLN
INTRAVENOUS | Status: AC
  Filled 2021-05-05: qty 5

## 2021-05-05 MED ORDER — ONDANSETRON HCL 4 MG/2ML IJ SOLN
4.0000 mg | Freq: Once | INTRAMUSCULAR | Status: AC
  Administered 2021-05-05: 4 mg via INTRAVENOUS
  Filled 2021-05-05: qty 2

## 2021-05-05 NOTE — ED Provider Notes (Signed)
?[name] ?Provider Note ? ? ?CSN: [id number] ?Arrival date & time: 05/05/21  1641 ? ?  ? ?History ? ?Chief Complaint  ?Patient presents with  ? Leg Pain  ? ? ?[name] is a 46 y.o. male. ? ?46 year old male with past medical history of thyroid cancer with mets, last chemo April 17, 2021, last transfusion April 25, 2021, presents with complaint of feeling unwell today.  Patient states that he woke up feeling his usual baseline this morning, took his child to school.  Afterwards he felt like his legs did not feel right and he went to lay down and take a rest.  Patient woke up from his nap feeling like his legs were cold, has since developed generalized body aches.  Questionable low-grade fever at home.  Patient has a history of PE and DVT, is on Eliquis currently.  Denies abdominal pain, chest pain, shortness of breath, denies dark/bloody stools.  No known sick contacts. ? ? ?  ? ?Home Medications ?Prior to Admission medications   ?Medication Sig Start Date End Date Taking? Authorizing Provider  ?apixaban (ELIQUIS) 5 MG TABS tablet TAKE 1 TABLET (5 MG TOTAL) BY MOUTH 2 (TWO) TIMES DAILY. 11/04/20 11/04/21  [name], MD  ?cyclobenzaprine (FLEXERIL) 10 MG tablet Take 1 tablet (10 mg total) by mouth 3 (three) times daily as needed. 04/28/21   [name], MD  ?diphenhydrAMINE (BENADRYL) 25 mg capsule TAKE 1 CAPSULE BY MOUTH 1 HOUR BEFORE CT SCAN ?Patient not taking: Reported on 11/04/2020 09/04/20 09/04/21  [name], MD  ?EPINEPHrine (EPIPEN 2-PAK) 0.3 mg/0.3 mL IJ SOAJ injection USE AS DIRECTED FOR LIFE THREATENING ALLERGIC REACTIONS ?Patient not taking: Reported on 04/03/2021 09/04/20   [name], MD  ?famotidine (PEPCID) 40 MG tablet Take 1 tablet (40 mg total) by mouth 2 (two) times daily. 03/13/21   [name], MD  ?fentaNYL (DURAGESIC) 50 MCG/HR Place 1 patch onto the skin every 3 (three) days. ?Patient not taking: Reported on 04/03/2021 03/13/21   [name], MD  ?HYDROmorphone (DILAUDID) 8 MG tablet Take 1 tablet (8 mg total) by mouth every 4 (four) hours as needed for severe pain. 04/10/21   [name], MD  ?Iron-FA-B Cmp-C-Biot-Probiotic (FUSION PLUS) CAPS Take 1 capsule by mouth daily. 05/20/20   Celso Amy, NP  ?levothyroxine (SYNTHROID) 200 MCG tablet Take one tablet (200 mcg dose) by mouth daily. Total:  288 mcg/day. 06/03/20     ?LORazepam (ATIVAN) 1 MG tablet Take 1 tablet (1 mg total) by mouth every 6 (six) hours as needed for anxiety. 04/28/21   [name], MD  ?methylphenidate (RITALIN) 10 MG tablet Take 1 tablet (10 mg total) by mouth 2 (two) times daily. 04/14/21   [name], MD  ?metoCLOPramide (REGLAN) 10 MG tablet Take 1 tablet (10 mg total) by mouth every 6 (six) hours as needed for nausea or vomiting. 11/28/20   [name], MD  ?metoprolol tartrate (LOPRESSOR) 50 MG tablet Take 1 tablet (50 mg total) by mouth 2 (two) times daily. 03/13/21   [name], MD  ?Multiple Vitamin (MULTIVITAMIN) capsule Take 1 capsule by mouth daily.    [provider]  ?OLANZapine (ZYPREXA) 10 MG tablet TAKE 1 TABLET (10 MG TOTAL) BY MOUTH AT BEDTIME. 11/28/20 11/28/21  [name], MD  ?polyethylene glycol (MIRALAX / GLYCOLAX) 17 g packet Take 17 g by mouth daily as needed for mild constipation.  04/15/20   Dessa Phi, DO  ?potassium chloride SA (KLOR-CON M) 20 MEQ tablet Take 1 tablet (20 mEq total) by mouth daily. 03/13/21   [name], MD  ?predniSONE (DELTASONE) 50 MG tablet TAKE 1 TABLET BY MOUTH 13 HOURS BEFORE CT SCAN, 1 TABLET 7 HOURS BEFORE CT SCAN, AND 1 TABLET 1 HOUR BEFORE CT [facility] 04/25/21 04/25/22  [name], MD  ?prochlorperazine (COMPAZINE) 10 MG tablet Take 1 tablet (10 mg total) by mouth every 6 (six) hours as needed (Nausea or vomiting). 03/20/21   [name], MD  ?promethazine (PHENERGAN) 25 MG tablet TAKE 1 TABLET BY MOUTH EVERY 6 HOURS AS NEEDED FOR NAUSEA & VOMITING 02/03/21 02/03/22   Celso Amy, NP  ?senna-docusate (SENOKOT-S) 8.6-50 MG tablet Take 1 tablet by mouth at bedtime as needed for mild constipation. 04/15/20   Dessa Phi, DO  ?temazepam (RESTORIL) 30 MG capsule Take 1 capsule (30 mg total) by mouth at bedtime as needed for sleep. 04/28/21   [name], MD  ?   ? ?Allergies    ?Dextromethorphan-guaifenesin, Doxycycline, Gadobutrol, Gadolinium derivatives, Guaifenesin, Ibuprofen, Lisinopril, Pantoprazole, Tramadol, Barium, and Chlorhexidine   ? ?Review of Systems   ?Review of Systems ?Negative except as per HPI ?Physical Exam ?Updated Vital Signs ?BP 121/76   Pulse 99   Temp 98.1 ?F (36.7 ?C)   Resp (!) 22   Ht 5\' 10"  (1.778 m)   Wt 118.1 kg   SpO2 94%   BMI 37.36 kg/m?  ?Physical Exam ?Vitals and nursing note reviewed.  ?Constitutional:   ?   General: He is not in acute distress. ?   Appearance: He is well-developed. He is not diaphoretic.  ?HENT:  ?   Head: Normocephalic and atraumatic.  ?   Mouth/Throat:  ?   Mouth: Mucous membranes are moist.  ?Eyes:  ?   Conjunctiva/sclera: Conjunctivae normal.  ?Cardiovascular:  ?   Rate and Rhythm: Regular rhythm. Tachycardia present.  ?   Pulses: Normal pulses.  ?Pulmonary:  ?   Effort: Pulmonary effort is normal.  ?   Breath sounds: Normal breath sounds.  ?Abdominal:  ?   Palpations: Abdomen is soft.  ?   Tenderness: There is no abdominal tenderness.  ?Musculoskeletal:  ?   Cervical back: Neck supple.  ?   Right lower leg: No edema.  ?   Left lower leg: No edema.  ?Skin: ?   General: Skin is dry.  ?   Coloration: Skin is pale.  ?Neurological:  ?   Mental Status: He is alert and oriented to person, place, and time.  ?   Sensory: No sensory deficit.  ?   Motor: No weakness.  ?Psychiatric:     ?   Behavior: Behavior normal.  ? ? ?ED Results / Procedures / Treatments   ?Labs ?(all labs ordered are listed, but only abnormal results are displayed) ?Labs Reviewed  ?CBC WITH DIFFERENTIAL/PLATELET - Abnormal; Notable for the following  components:  ?    Result Value  ? WBC 3.9 (*)   ? Hemoglobin 12.5 (*)   ? RDW 19.2 (*)   ? Lymphs Abs 0.3 (*)   ? All other components within normal limits  ?COMPREHENSIVE METABOLIC PANEL - Abnormal; Notable for the following components:  ? Glucose, Bld 111 (*)   ? All other components within normal limits  ?RESP PANEL BY RT-PCR (FLU A&B, COVID) [id number]  ?CULTURE, BLOOD (ROUTINE X 2)  ?CULTURE, BLOOD (ROUTINE X 2)  ?  LACTIC ACID, PLASMA  ? ? ?EKG ?None ? ?Radiology ?DG Chest Port 1 View ? ?Result Date: 05/05/2021 ?CLINICAL DATA:  Weakness. Patient reports legs have been cold. Fever today EXAM: PORTABLE CHEST 1 VIEW COMPARISON:  Radiograph 01/01/2021. PET CT 01/15/2021 FINDINGS: Left chest port in place. Stable heart size and mediastinal contours. No confluent consolidation. Minor subsegmental atelectasis in the lung bases. No pulmonary edema, pleural effusion, or pneumothorax. No acute osseous abnormalities are seen. Pulmonary nodules on prior PET are not seen on the current exam. IMPRESSION: Minor subsegmental bibasilar atelectasis. Electronically Signed   By: Keith Rake M.D.   On: 05/05/2021 17:50   ? ?Procedures ?Procedures  ? ? ?Medications Ordered in ED ?Medications  ?HYDROmorphone (DILAUDID) injection 1 mg (has no administration in time range)  ?sodium chloride 0.9 % bolus 500 mL (500 mLs Intravenous New Bag/Given 05/05/21 1811)  ?ondansetron [facility]) injection 4 mg (4 mg Intravenous Given 05/05/21 1811)  ?HYDROmorphone (DILAUDID) injection 0.5 mg (0.5 mg Intravenous Given 05/05/21 1812)  ? ? ?ED Course/ Medical Decision Making/ A&P ?  ?                        ?Medical Decision Making ?Amount and/or Complexity of Data Reviewed ?Labs: ordered. ?Radiology: ordered. ? ?Risk ?Prescription drug management. ? ? ?This patient presents to the ED for concern of feeling unwell, generally weak with body aches, this involves an extensive number of treatment options, and is a complaint that carries with it a high risk of  complications and morbidity.  The differential diagnosis includes but not limited to dehydration, worsening disease state, anemia ? ? ?Co morbidities that complicate the patient evaluation ? ?Metastatic canc

## 2021-05-05 NOTE — ED Triage Notes (Signed)
Lower legs have been cold. Hx of DVT. He takes Eliquis. Fever today.  ?

## 2021-05-05 NOTE — Discharge Instructions (Signed)
Continue home regimen of pain medication.  Call your oncologist office to discuss your presentation the emergency room today.  Return to ER for any worsening or concerning symptoms. ?

## 2021-05-07 ENCOUNTER — Other Ambulatory Visit: Payer: Self-pay | Admitting: Hematology & Oncology

## 2021-05-07 ENCOUNTER — Other Ambulatory Visit (HOSPITAL_BASED_OUTPATIENT_CLINIC_OR_DEPARTMENT_OTHER): Payer: Self-pay

## 2021-05-07 DIAGNOSIS — C799 Secondary malignant neoplasm of unspecified site: Secondary | ICD-10-CM

## 2021-05-07 DIAGNOSIS — R110 Nausea: Secondary | ICD-10-CM

## 2021-05-07 MED ORDER — LORAZEPAM 1 MG PO TABS
1.0000 mg | ORAL_TABLET | Freq: Four times a day (QID) | ORAL | 0 refills | Status: DC | PRN
  Filled 2021-05-07: qty 30, 8d supply, fill #0

## 2021-05-08 ENCOUNTER — Inpatient Hospital Stay (HOSPITAL_BASED_OUTPATIENT_CLINIC_OR_DEPARTMENT_OTHER): Payer: BC Managed Care – PPO | Admitting: Hematology & Oncology

## 2021-05-08 ENCOUNTER — Inpatient Hospital Stay: Payer: BC Managed Care – PPO

## 2021-05-08 ENCOUNTER — Other Ambulatory Visit: Payer: Self-pay

## 2021-05-08 ENCOUNTER — Other Ambulatory Visit (HOSPITAL_BASED_OUTPATIENT_CLINIC_OR_DEPARTMENT_OTHER): Payer: Self-pay

## 2021-05-08 ENCOUNTER — Ambulatory Visit: Payer: BC Managed Care – PPO

## 2021-05-08 ENCOUNTER — Other Ambulatory Visit: Payer: BC Managed Care – PPO

## 2021-05-08 ENCOUNTER — Encounter: Payer: Self-pay | Admitting: Hematology & Oncology

## 2021-05-08 VITALS — BP 109/77 | HR 84 | Temp 98.3°F | Resp 17 | Ht 70.0 in | Wt 261.0 lb

## 2021-05-08 DIAGNOSIS — E291 Testicular hypofunction: Secondary | ICD-10-CM

## 2021-05-08 DIAGNOSIS — C787 Secondary malignant neoplasm of liver and intrahepatic bile duct: Secondary | ICD-10-CM

## 2021-05-08 DIAGNOSIS — C73 Malignant neoplasm of thyroid gland: Secondary | ICD-10-CM

## 2021-05-08 DIAGNOSIS — Z5111 Encounter for antineoplastic chemotherapy: Secondary | ICD-10-CM | POA: Diagnosis not present

## 2021-05-08 DIAGNOSIS — D509 Iron deficiency anemia, unspecified: Secondary | ICD-10-CM

## 2021-05-08 LAB — CMP (CANCER CENTER ONLY)
ALT: 13 U/L (ref 0–44)
AST: 26 U/L (ref 15–41)
Albumin: 4.1 g/dL (ref 3.5–5.0)
Alkaline Phosphatase: 128 U/L — ABNORMAL HIGH (ref 38–126)
Anion gap: 7 (ref 5–15)
BUN: 15 mg/dL (ref 6–20)
CO2: 28 mmol/L (ref 22–32)
Calcium: 9.2 mg/dL (ref 8.9–10.3)
Chloride: 102 mmol/L (ref 98–111)
Creatinine: 0.76 mg/dL (ref 0.61–1.24)
GFR, Estimated: >60 mL/min (ref >60)
Glucose, Bld: 116 mg/dL — ABNORMAL HIGH (ref 70–99)
Potassium: 4.2 mmol/L (ref 3.5–5.1)
Sodium: 137 mmol/L (ref 135–145)
Total Bilirubin: 0.7 mg/dL (ref 0.3–1.2)
Total Protein: 6.7 g/dL (ref 6.5–8.1)

## 2021-05-08 LAB — CBC WITH DIFFERENTIAL (CANCER CENTER ONLY)
Abs Immature Granulocytes: 0.02 10*3/uL (ref 0.00–0.07)
Basophils Absolute: 0.1 10*3/uL (ref 0.0–0.1)
Basophils Relative: 1 %
Eosinophils Absolute: 0.1 10*3/uL (ref 0.0–0.5)
Eosinophils Relative: 1 %
HCT: 37.1 % — ABNORMAL LOW (ref 39.0–52.0)
Hemoglobin: 11.4 g/dL — ABNORMAL LOW (ref 13.0–17.0)
Immature Granulocytes: 0 %
Lymphocytes Relative: 6 %
Lymphs Abs: 0.4 10*3/uL — ABNORMAL LOW (ref 0.7–4.0)
MCH: 28.1 pg (ref 26.0–34.0)
MCHC: 30.7 g/dL (ref 30.0–36.0)
MCV: 91.6 fL (ref 80.0–100.0)
Monocytes Absolute: 0.9 10*3/uL (ref 0.1–1.0)
Monocytes Relative: 15 %
Neutro Abs: 4.4 10*3/uL (ref 1.7–7.7)
Neutrophils Relative %: 77 %
Platelet Count: 258 10*3/uL (ref 150–400)
RBC: 4.05 MIL/uL — ABNORMAL LOW (ref 4.22–5.81)
RDW: 19 % — ABNORMAL HIGH (ref 11.5–15.5)
WBC Count: 5.8 10*3/uL (ref 4.0–10.5)
nRBC: 0 % (ref 0.0–0.2)

## 2021-05-08 LAB — TSH: TSH: 6.947 u[IU]/mL — ABNORMAL HIGH (ref 0.320–4.118)

## 2021-05-08 LAB — SAMPLE TO BLOOD BANK

## 2021-05-08 LAB — T4, FREE: Free T4: 0.75 ng/dL (ref 0.61–1.12)

## 2021-05-08 MED ORDER — SODIUM CHLORIDE 0.9 % IV SOLN: Freq: Once | INTRAVENOUS | Status: AC

## 2021-05-08 MED ORDER — HEPARIN SOD (PORK) LOCK FLUSH 100 UNIT/ML IV SOLN
500.0000 [IU] | Freq: Once | INTRAVENOUS | Status: AC | PRN
  Administered 2021-05-08: 500 [IU]

## 2021-05-08 MED ORDER — VINBLASTINE SULFATE CHEMO INJECTION 1 MG/ML
6.1700 mg/m2 | Freq: Once | INTRAVENOUS | Status: AC
  Administered 2021-05-08: 15 mg via INTRAVENOUS
  Filled 2021-05-08: qty 15

## 2021-05-08 MED ORDER — PROCHLORPERAZINE MALEATE 10 MG PO TABS
10.0000 mg | ORAL_TABLET | Freq: Once | ORAL | Status: AC
  Administered 2021-05-08: 10 mg via ORAL
  Filled 2021-05-08: qty 1

## 2021-05-08 MED ORDER — SODIUM CHLORIDE 0.9% FLUSH
10.0000 mL | INTRAVENOUS | Status: DC | PRN
  Administered 2021-05-08: 10 mL

## 2021-05-08 MED ORDER — TESTOSTERONE CYPIONATE 200 MG/ML IM SOLN
400.0000 mg | INTRAMUSCULAR | Status: DC
  Administered 2021-05-08: 400 mg via INTRAMUSCULAR
  Filled 2021-05-08: qty 2

## 2021-05-08 NOTE — Patient Instructions (Signed)

## 2021-05-08 NOTE — Progress Notes (Signed)
?Hematology and Oncology Follow Up Visit ? ?Brian [name] ?[id number] ?12/29/1975 46 y.o. ?05/08/2021 ? ? 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  ?Past Therapy: ?Cabometyx 40 mg po q day -- start on 04/17/2019 - d/c on 07/17/2019 for progression ?Lenvima 24 mg po q day -- on hold due to proteinuria ?Taxotere/CDDP -- s/p cycle 4 - started on 09/13/2019 ?  ?Current Therapy:     ? ?Vinblastine q week x 4 weeks on / 1 week off -- s/p  cycle #2 -- start on 02/24/2021 ?Gavreto 400 mg po q day -- started on 05/17/2020  -- d/c on 06/16/2020 ?Adriamycin weekly 2 weeks on 1 week off - s/p cycle 8 --d/c on 04/16/2020 due to progression ?XRT for LEFT hip met ?Pembrolizumab 200 mg IV q 3 week -- s/p cycle #8 - start on 06/26/2020 -- d/c on 01/16/2021 due to progression ?Zometa 4 mg IV q 3 months - next dose on 05/2021 ?Eliquis 5 mg po BID ?  ?Interim History:  [name] is here today for follow-up.  He does feel a little bit better on the testosterone.  His testosterone level is incredibly low when we checked.  I think that this is all quality of life for him. ? ?He is worried about circulation in his legs.  He went to the emergency room recently.  Everything turned out fine. ? ?He is actually tolerating the chemotherapy pretty well.  We now have him on weekly vinblastine.  He will finish up his second cycle today.  After this, we will then set him up with his scans.  He will have an MRI and a PET scan. ? ?His pain is doing okay.  He does have some element of pain.  He is on Xtampza and Dilaudid. ? ?His appetite is doing okay.  He is eating pretty well.  He is having no problems with nausea or vomiting. ? ?There is been no bleeding.  He is on Eliquis.  He has had no chest wall pain. ? ?Overall, I would say his performance status is probably ECOG 1.   ? ? ?Medications:  ?Allergies as of  05/08/2021   ? ?   Reactions  ? Dextromethorphan-guaifenesin Other (See Comments)  ? Irregular heartbeat  ? Doxycycline Anaphylaxis, Nausea And Vomiting, Rash, Other (See Comments)  ? "heart arrythmia" and "dyspepsia" ?(only oral doxycycline causes reaction)  ? Gadobutrol Hives, Other (See Comments)  ? Patient had MRI scan at [place]. Patient called one hour after he left imaging facility to report two "blisters" that came up on "back" of lip.   ? Gadolinium Derivatives Hives  ? Patient had MRI scan at [facility]. Patient called one hour after he left imaging facility to report two "blisters" that came up on "back" of lip.   ? Guaifenesin Palpitations  ?   ? Ibuprofen Hives, Itching  ? Lisinopril Other (See Comments)  ? Angioedema  ? Pantoprazole Itching  ? Tramadol Hives, Itching  ? Barium Rash  ? Developed redness around neck after drinking 1st bottle of Barocat; pt was given Benedryl by ED Mds to be "on the safe side" before drinking 2nd bottle  ? Chlorhexidine Hives  ? ?  ? ?  ?Medication List  ?  ? ?  ? Accurate as of May 08, 2021 10:10 AM. If you have any questions,  ask your nurse or doctor.  ?  ?  ? ?  ? ?cyclobenzaprine 10 MG tablet ?Commonly known as: FLEXERIL ?Take 1 tablet (10 mg total) by mouth 3 (three) times daily as needed. ?  ?diphenhydrAMINE 25 mg capsule ?Commonly known as: BENADRYL ?TAKE 1 CAPSULE BY MOUTH 1 HOUR BEFORE CT SCAN ?  ?Eliquis 5 MG Tabs tablet ?Generic drug: apixaban ?TAKE 1 TABLET (5 MG TOTAL) BY MOUTH 2 (TWO) TIMES DAILY. ?  ?EPINEPHrine 0.3 mg/0.3 mL Soaj injection ?Commonly known as: EpiPen 2-Pak ?USE AS DIRECTED FOR LIFE THREATENING ALLERGIC REACTIONS ?  ?famotidine 40 MG tablet ?Commonly known as: PEPCID ?Take 1 tablet (40 mg total) by mouth 2 (two) times daily. ?  ?fentaNYL 50 MCG/HR ?Commonly known as: [place] ?Place 1 patch onto the skin every 3 (three) days. ?  ?Fusion Plus Caps ?Take 1 capsule by mouth daily. ?  ?HYDROmorphone 8 MG tablet ?Commonly known  as: DILAUDID ?Take 1 tablet (8 mg total) by mouth every 4 (four) hours as needed for severe pain. ?  ?levothyroxine 200 MCG tablet ?Commonly known as: SYNTHROID ?Take one tablet (200 mcg dose) by mouth daily. Total:  288 mcg/day. ?  ?LORazepam 1 MG tablet ?Commonly known as: ATIVAN ?Take 1 tablet (1 mg total) by mouth every 6 (six) hours as needed for anxiety. ?  ?methylphenidate 10 MG tablet ?Commonly known as: RITALIN ?Take 1 tablet (10 mg total) by mouth 2 (two) times daily. ?  ?metoCLOPramide 10 MG tablet ?Commonly known as: REGLAN ?Take 1 tablet (10 mg total) by mouth every 6 (six) hours as needed for nausea or vomiting. ?  ?metoprolol tartrate 50 MG tablet ?Commonly known as: Lopressor ?Take 1 tablet (50 mg total) by mouth 2 (two) times daily. ?  ?multivitamin capsule ?Take 1 capsule by mouth daily. ?  ?OLANZapine 10 MG tablet ?Commonly known as: ZYPREXA ?TAKE 1 TABLET (10 MG TOTAL) BY MOUTH AT BEDTIME. ?  ?polyethylene glycol 17 g packet ?Commonly known as: MIRALAX / GLYCOLAX ?Take 17 g by mouth daily as needed for mild constipation. ?  ?potassium chloride SA 20 MEQ tablet ?Commonly known as: KLOR-CON M ?Take 1 tablet (20 mEq total) by mouth daily. ?  ?predniSONE 50 MG tablet ?Commonly known as: DELTASONE ?TAKE 1 TABLET BY MOUTH 13 HOURS BEFORE CT SCAN, 1 TABLET 7 HOURS BEFORE CT SCAN, AND 1 TABLET 1 HOUR BEFORE CT SCAN ?  ?prochlorperazine 10 MG tablet ?Commonly known as: COMPAZINE ?Take 1 tablet (10 mg total) by mouth every 6 (six) hours as needed (Nausea or vomiting). ?  ?promethazine 25 MG tablet ?Commonly known as: PHENERGAN ?TAKE 1 TABLET BY MOUTH EVERY 6 HOURS AS NEEDED FOR NAUSEA & VOMITING ?  ?senna-docusate 8.6-50 MG tablet ?Commonly known as: Senokot-S ?Take 1 tablet by mouth at bedtime as needed for mild constipation. ?  ?temazepam 30 MG capsule ?Commonly known as: RESTORIL ?Take 1 capsule (30 mg total) by mouth at bedtime as needed for sleep. ?  ?Xtampza ER 36 MG C12a ?Generic drug: oxyCODONE  ER ?Take 1 capsule (36 mg total) by mouth every 12 (twelve) hours ?  ? ?  ? ? ?Allergies:  ?Allergies  ?Allergen Reactions  ? Dextromethorphan-Guaifenesin Other (See Comments)  ?  Irregular heartbeat ?  ? Doxycycline Anaphylaxis, Nausea And Vomiting, Rash and Other (See Comments)  ?  "heart arrythmia" and "dyspepsia" ?(only oral doxycycline causes reaction)  ? Gadobutrol Hives and Other (See Comments)  ?  Patient had MRI scan at [place].  Patient called one hour after he left imaging facility to report two "blisters" that came up on "back" of lip.  ?  ? Gadolinium Derivatives Hives  ?  Patient had MRI scan at [place]. Patient called one hour after he left imaging facility to report two "blisters" that came up on "back" of lip.   ? Guaifenesin Palpitations  ?   ?  ? Ibuprofen Hives and Itching  ? Lisinopril Other (See Comments)  ?  Angioedema  ? Pantoprazole Itching  ? Tramadol Hives and Itching  ? Barium Rash  ?  Developed redness around neck after drinking 1st bottle of Barocat; pt was given Benedryl by ED Mds to be "on the safe side" before drinking 2nd bottle  ? Chlorhexidine Hives  ? ? ?Past Medical History, Surgical history, Social history, and Family History were reviewed and updated. ? ?Review of Systems: ?Review of Systems  ?Constitutional:  Positive for malaise/fatigue.  ?HENT: Negative.    ?Eyes: Negative.   ?Respiratory: Negative.    ?Cardiovascular: Negative.   ?Gastrointestinal:  Positive for abdominal pain, constipation, heartburn and nausea.  ?Genitourinary: Negative.   ?Musculoskeletal:  Positive for joint pain.  ?Skin: Negative.   ?Neurological: Negative.   ?Endo/Heme/Allergies: Negative.   ?Psychiatric/Behavioral: Negative.    ? ? ?Physical Exam: ? height is 5' 10" (1.778 m) and weight is 261 lb (118.4 kg). His oral temperature is 98.3 ?F (36.8 ?C). His blood pressure is 109/77 and his pulse is 84. His respiration is 17 and oxygen saturation is 98%.  ? ?Wt Readings from Last 3  Encounters:  ?05/08/21 261 lb (118.4 kg)  ?05/05/21 260 lb 5.8 oz (118.1 kg)  ?04/17/21 260 lb 6.4 oz (118.1 kg)  ?His vital signs are temperature of 98.7.  Pulse 114.  Blood pressure 129/73.  Weight is 257 pound

## 2021-05-08 NOTE — Patient Instructions (Signed)
[facility]  Discharge Instructions: ?Thank you for choosing [facility] to provide your oncology and hematology care.  ? ?If you have a lab appointment with the [facility], please go directly to the [facility] and check in at the registration area. ? ?Wear comfortable clothing and clothing appropriate for easy access to any Portacath or PICC line.  ? ?We strive to give you quality time with your provider. You may need to reschedule your appointment if you arrive late (15 or more minutes).  Arriving late affects you and other patients whose appointments are after yours.  Also, if you miss three or more appointments without notifying the office, you may be dismissed from the clinic at the provider?s discretion.    ?  ?For prescription refill requests, have your pharmacy contact our office and allow 72 hours for refills to be completed.   ? ?Today you received the following chemotherapy and/or immunotherapy agents velban    ?  ?To help prevent nausea and vomiting after your treatment, we encourage you to take your nausea medication as directed. ? ?BELOW ARE SYMPTOMS THAT SHOULD BE REPORTED IMMEDIATELY: ?*FEVER GREATER THAN 100.4 F (38 ?C) OR HIGHER ?*CHILLS OR SWEATING ?*NAUSEA AND VOMITING THAT IS NOT CONTROLLED WITH YOUR NAUSEA MEDICATION ?*UNUSUAL SHORTNESS OF BREATH ?*UNUSUAL BRUISING OR BLEEDING ?*URINARY PROBLEMS (pain or burning when urinating, or frequent urination) ?*BOWEL PROBLEMS (unusual diarrhea, constipation, pain near the anus) ?TENDERNESS IN MOUTH AND THROAT WITH OR WITHOUT PRESENCE OF ULCERS (sore throat, sores in mouth, or a toothache) ?UNUSUAL RASH, SWELLING OR PAIN  ?UNUSUAL VAGINAL DISCHARGE OR ITCHING  ? ?Items with * indicate a potential emergency and should be followed up as soon as possible or go to the Emergency Department if any problems should occur. ? ?Please show the CHEMOTHERAPY ALERT CARD or IMMUNOTHERAPY ALERT CARD at check-in to the  Emergency Department and triage nurse. ?Should you have questions after your visit or need to cancel or reschedule your appointment, please contact [facility]  [phone] and follow the prompts.  Office hours are 8:00 a.m. to 4:30 p.m. Monday - Friday. Please note that voicemails left after 4:00 p.m. may not be returned until the following business day.  We are closed weekends and major holidays. You have access to a nurse at all times for urgent questions. Please call the main number to the clinic [phone] and follow the prompts. ? ?For any non-urgent questions, you may also contact your provider using MyChart. We now offer e-Visits for anyone 39 and older to request care online for non-urgent symptoms. For details visit [email or web address]. ?  ?Also download the MyChart app! Go to the app store, search "MyChart", open the app, select [facility], and log in with your MyChart username and password. ? ?Due to Covid, a mask is required upon entering the hospital/clinic. If you do not have a mask, one will be given to you upon arrival. For doctor visits, patients may have 1 support person aged 33 or older with them. For treatment visits, patients cannot have anyone with them due to current Covid guidelines and our immunocompromised population.  ?

## 2021-05-09 LAB — TESTOSTERONE: Testosterone: 254 ng/dL — ABNORMAL LOW (ref 264–916)

## 2021-05-10 ENCOUNTER — Ambulatory Visit (HOSPITAL_COMMUNITY): Payer: BC Managed Care – PPO

## 2021-05-10 LAB — CULTURE, BLOOD (ROUTINE X 2)
Culture: NO GROWTH
Special Requests: ADEQUATE

## 2021-05-14 ENCOUNTER — Ambulatory Visit (HOSPITAL_BASED_OUTPATIENT_CLINIC_OR_DEPARTMENT_OTHER)
Admission: RE | Admit: 2021-05-14 | Discharge: 2021-05-14 | Disposition: A | Discharge status: Home / Self Care | Payer: BC Managed Care – PPO | Source: Ambulatory Visit | Attending: Hematology & Oncology | Admitting: Hematology & Oncology

## 2021-05-14 ENCOUNTER — Inpatient Hospital Stay: Payer: BC Managed Care – PPO

## 2021-05-14 ENCOUNTER — Telehealth: Payer: Self-pay | Admitting: *Deleted

## 2021-05-14 VITALS — BP 96/63 | HR 70 | Temp 98.0°F | Resp 17

## 2021-05-14 DIAGNOSIS — C73 Malignant neoplasm of thyroid gland: Secondary | ICD-10-CM

## 2021-05-14 DIAGNOSIS — C801 Malignant (primary) neoplasm, unspecified: Secondary | ICD-10-CM

## 2021-05-14 DIAGNOSIS — D509 Iron deficiency anemia, unspecified: Secondary | ICD-10-CM

## 2021-05-14 DIAGNOSIS — Z5111 Encounter for antineoplastic chemotherapy: Secondary | ICD-10-CM | POA: Diagnosis not present

## 2021-05-14 DIAGNOSIS — G893 Neoplasm related pain (acute) (chronic): Secondary | ICD-10-CM

## 2021-05-14 DIAGNOSIS — E291 Testicular hypofunction: Secondary | ICD-10-CM

## 2021-05-14 MED ORDER — SODIUM CHLORIDE 0.9% FLUSH
10.0000 mL | Freq: Once | INTRAVENOUS | Status: AC
  Administered 2021-05-14: 10 mL via INTRAVENOUS

## 2021-05-14 MED ORDER — KETOROLAC TROMETHAMINE 15 MG/ML IJ SOLN
30.0000 mg | Freq: Once | INTRAMUSCULAR | Status: AC
  Administered 2021-05-14: 30 mg via INTRAVENOUS
  Filled 2021-05-14: qty 2

## 2021-05-14 MED ORDER — HYDROMORPHONE HCL 4 MG/ML IJ SOLN
4.0000 mg | Freq: Once | INTRAMUSCULAR | Status: AC
  Administered 2021-05-14: 4 mg via INTRAVENOUS
  Filled 2021-05-14: qty 1

## 2021-05-14 MED ORDER — HEPARIN SOD (PORK) LOCK FLUSH 100 UNIT/ML IV SOLN
500.0000 [IU] | Freq: Once | INTRAVENOUS | Status: AC
  Administered 2021-05-14: 500 [IU] via INTRAVENOUS

## 2021-05-14 MED ORDER — SODIUM CHLORIDE 0.9 % IV SOLN: INTRAVENOUS | Status: DC

## 2021-05-14 NOTE — Patient Instructions (Signed)

## 2021-05-14 NOTE — Telephone Encounter (Signed)
Call received from patient crying stating that he is having "extreme pain to his shoulder" that is not relieved by Dilaudid or Xtampra.  Dr. Marin Olp notified.  Call placed back to patient and patient notified per order of Dr. Marin Olp to come in now for shoulder xray, IVF's and IV pain medicine.  Pt states that he should be able to be here by 1:30PM. Message sent to scheduling.  ? ? ?

## 2021-05-16 ENCOUNTER — Encounter (HOSPITAL_COMMUNITY)
Admission: RE | Admit: 2021-05-16 | Discharge: 2021-05-16 | Disposition: A | Discharge status: Home / Self Care | Payer: BC Managed Care – PPO | Source: Ambulatory Visit | Attending: Hematology & Oncology | Admitting: Hematology & Oncology

## 2021-05-16 DIAGNOSIS — C799 Secondary malignant neoplasm of unspecified site: Secondary | ICD-10-CM | POA: Insufficient documentation

## 2021-05-16 DIAGNOSIS — C787 Secondary malignant neoplasm of liver and intrahepatic bile duct: Secondary | ICD-10-CM | POA: Diagnosis present

## 2021-05-16 DIAGNOSIS — C73 Malignant neoplasm of thyroid gland: Secondary | ICD-10-CM | POA: Insufficient documentation

## 2021-05-16 LAB — GLUCOSE, CAPILLARY: Glucose-Capillary: 108 mg/dL — ABNORMAL HIGH (ref 70–99)

## 2021-05-16 MED ORDER — FLUDEOXYGLUCOSE F - 18 (FDG) INJECTION
13.0200 | Freq: Once | INTRAVENOUS | Status: AC | PRN
  Administered 2021-05-16: 13.02 via INTRAVENOUS

## 2021-05-19 ENCOUNTER — Other Ambulatory Visit: Payer: Self-pay | Admitting: Hematology & Oncology

## 2021-05-19 ENCOUNTER — Other Ambulatory Visit (HOSPITAL_BASED_OUTPATIENT_CLINIC_OR_DEPARTMENT_OTHER): Payer: Self-pay

## 2021-05-19 ENCOUNTER — Ambulatory Visit (HOSPITAL_COMMUNITY)
Admission: RE | Admit: 2021-05-19 | Discharge: 2021-05-19 | Disposition: A | Discharge status: Home / Self Care | Payer: BC Managed Care – PPO | Source: Ambulatory Visit | Attending: Hematology & Oncology | Admitting: Hematology & Oncology

## 2021-05-19 DIAGNOSIS — C799 Secondary malignant neoplasm of unspecified site: Secondary | ICD-10-CM

## 2021-05-19 DIAGNOSIS — M546 Pain in thoracic spine: Secondary | ICD-10-CM

## 2021-05-19 DIAGNOSIS — C787 Secondary malignant neoplasm of liver and intrahepatic bile duct: Secondary | ICD-10-CM | POA: Insufficient documentation

## 2021-05-19 DIAGNOSIS — R110 Nausea: Secondary | ICD-10-CM

## 2021-05-19 MED ORDER — HYDROMORPHONE HCL 8 MG PO TABS
8.0000 mg | ORAL_TABLET | ORAL | 0 refills | Status: DC | PRN
  Filled 2021-05-19: qty 120, 20d supply, fill #0

## 2021-05-19 MED ORDER — GADOBUTROL 1 MMOL/ML IV SOLN
10.0000 mL | Freq: Once | INTRAVENOUS | Status: AC | PRN
  Administered 2021-05-19: 10 mL via INTRAVENOUS

## 2021-05-20 ENCOUNTER — Telehealth: Payer: Self-pay | Admitting: Radiation Oncology

## 2021-05-20 NOTE — Telephone Encounter (Signed)
4/4 @ 11:06 am Left voicemail for patient to call our office to be schedule for consult. ?

## 2021-05-22 ENCOUNTER — Inpatient Hospital Stay: Payer: BC Managed Care – PPO

## 2021-05-22 ENCOUNTER — Inpatient Hospital Stay: Payer: BC Managed Care – PPO | Admitting: Hematology & Oncology

## 2021-05-22 ENCOUNTER — Other Ambulatory Visit (HOSPITAL_BASED_OUTPATIENT_CLINIC_OR_DEPARTMENT_OTHER): Payer: Self-pay

## 2021-05-22 ENCOUNTER — Other Ambulatory Visit: Payer: Self-pay

## 2021-05-22 ENCOUNTER — Encounter: Payer: Self-pay | Admitting: Hematology & Oncology

## 2021-05-22 VITALS — BP 105/66 | HR 76 | Temp 98.4°F | Resp 18 | Ht 70.0 in | Wt 261.0 lb

## 2021-05-22 DIAGNOSIS — Z79899 Other long term (current) drug therapy: Secondary | ICD-10-CM | POA: Insufficient documentation

## 2021-05-22 DIAGNOSIS — M898X1 Other specified disorders of bone, shoulder: Secondary | ICD-10-CM | POA: Insufficient documentation

## 2021-05-22 DIAGNOSIS — C799 Secondary malignant neoplasm of unspecified site: Secondary | ICD-10-CM | POA: Diagnosis not present

## 2021-05-22 DIAGNOSIS — C73 Malignant neoplasm of thyroid gland: Secondary | ICD-10-CM | POA: Insufficient documentation

## 2021-05-22 DIAGNOSIS — C787 Secondary malignant neoplasm of liver and intrahepatic bile duct: Secondary | ICD-10-CM | POA: Insufficient documentation

## 2021-05-22 DIAGNOSIS — Z7901 Long term (current) use of anticoagulants: Secondary | ICD-10-CM | POA: Insufficient documentation

## 2021-05-22 DIAGNOSIS — I2699 Other pulmonary embolism without acute cor pulmonale: Secondary | ICD-10-CM | POA: Insufficient documentation

## 2021-05-22 DIAGNOSIS — E291 Testicular hypofunction: Secondary | ICD-10-CM | POA: Insufficient documentation

## 2021-05-22 DIAGNOSIS — I82432 Acute embolism and thrombosis of left popliteal vein: Secondary | ICD-10-CM | POA: Insufficient documentation

## 2021-05-22 DIAGNOSIS — C801 Malignant (primary) neoplasm, unspecified: Secondary | ICD-10-CM

## 2021-05-22 DIAGNOSIS — C7951 Secondary malignant neoplasm of bone: Secondary | ICD-10-CM | POA: Insufficient documentation

## 2021-05-22 DIAGNOSIS — Z510 Encounter for antineoplastic radiation therapy: Secondary | ICD-10-CM | POA: Insufficient documentation

## 2021-05-22 LAB — CBC WITH DIFFERENTIAL (CANCER CENTER ONLY)
Abs Immature Granulocytes: 0.01 10*3/uL (ref 0.00–0.07)
Basophils Absolute: 0.1 10*3/uL (ref 0.0–0.1)
Basophils Relative: 1 %
Eosinophils Absolute: 0.1 10*3/uL (ref 0.0–0.5)
Eosinophils Relative: 2 %
HCT: 36.3 % — ABNORMAL LOW (ref 39.0–52.0)
Hemoglobin: 11 g/dL — ABNORMAL LOW (ref 13.0–17.0)
Immature Granulocytes: 0 %
Lymphocytes Relative: 9 %
Lymphs Abs: 0.4 10*3/uL — ABNORMAL LOW (ref 0.7–4.0)
MCH: 28.5 pg (ref 26.0–34.0)
MCHC: 30.3 g/dL (ref 30.0–36.0)
MCV: 94 fL (ref 80.0–100.0)
Monocytes Absolute: 0.7 10*3/uL (ref 0.1–1.0)
Monocytes Relative: 15 %
Neutro Abs: 3.1 10*3/uL (ref 1.7–7.7)
Neutrophils Relative %: 73 %
Platelet Count: 277 10*3/uL (ref 150–400)
RBC: 3.86 MIL/uL — ABNORMAL LOW (ref 4.22–5.81)
RDW: 18.2 % — ABNORMAL HIGH (ref 11.5–15.5)
WBC Count: 4.3 10*3/uL (ref 4.0–10.5)
nRBC: 0 % (ref 0.0–0.2)

## 2021-05-22 LAB — CMP (CANCER CENTER ONLY)
ALT: 24 U/L (ref 0–44)
AST: 35 U/L (ref 15–41)
Albumin: 3.7 g/dL (ref 3.5–5.0)
Alkaline Phosphatase: 117 U/L (ref 38–126)
Anion gap: 7 (ref 5–15)
BUN: 21 mg/dL — ABNORMAL HIGH (ref 6–20)
CO2: 29 mmol/L (ref 22–32)
Calcium: 8.8 mg/dL — ABNORMAL LOW (ref 8.9–10.3)
Chloride: 101 mmol/L (ref 98–111)
Creatinine: 1.05 mg/dL (ref 0.61–1.24)
GFR, Estimated: >60 mL/min (ref >60)
Glucose, Bld: 115 mg/dL — ABNORMAL HIGH (ref 70–99)
Potassium: 3.9 mmol/L (ref 3.5–5.1)
Sodium: 137 mmol/L (ref 135–145)
Total Bilirubin: 0.6 mg/dL (ref 0.3–1.2)
Total Protein: 6.5 g/dL (ref 6.5–8.1)

## 2021-05-22 LAB — SAMPLE TO BLOOD BANK

## 2021-05-22 LAB — LACTATE DEHYDROGENASE: LDH: 414 U/L — ABNORMAL HIGH (ref 98–192)

## 2021-05-22 MED ORDER — SODIUM CHLORIDE 0.9% FLUSH
10.0000 mL | INTRAVENOUS | Status: AC | PRN
  Administered 2021-05-22: 10 mL via INTRAVENOUS

## 2021-05-22 MED ORDER — HEPARIN SOD (PORK) LOCK FLUSH 100 UNIT/ML IV SOLN
500.0000 [IU] | Freq: Once | INTRAVENOUS | Status: AC
  Administered 2021-05-22: 500 [IU] via INTRAVENOUS

## 2021-05-22 NOTE — Progress Notes (Signed)
?Hematology and Oncology Follow Up Visit ? ?Brian [name] ?[id number] ?1976-02-04 46 y.o. ?05/22/2021 ? ? 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  ?Past Therapy: ?Cabometyx 40 mg po q day -- start on 04/17/2019 - d/c on 07/17/2019 for progression ?Lenvima 24 mg po q day -- on hold due to proteinuria ?Taxotere/CDDP -- s/p cycle 4 - started on 09/13/2019 ?  ?Current Therapy:     ? ?Vinblastine q week x 4 weeks on / 1 week off -- s/p  cycle #2 -- start on 02/24/2021 -- d/c on 05/20/2021 ?Gavreto 400 mg po q day -- started on 05/17/2020  -- d/c on 06/16/2020 ?Adriamycin weekly 2 weeks on 1 week off - s/p cycle 8 --d/c on 04/16/2020 due to progression ?XRT for LEFT hip met ?Pembrolizumab 200 mg IV q 3 week -- s/p cycle #8 - start on 06/26/2020 -- d/c on 01/16/2021 due to progression ?Zometa 4 mg IV q 3 months - next dose on 05/2021 ?Eliquis 5 mg po BID ?  ?Interim History:  [name] is here today for follow-up.  Unfortunately, he is beginning to progress.  We did do a PET scan and a MRI of the liver.  Both show that he had significant disease progression. ? ?He is complaining of pain in the bones.  This is mostly in his left shoulder.  I spoke with Dr. Sondra [name] of Radiation Oncology this morning.  He will get [name] in so that he can have some palliative radiation to the left shoulder. ? ?He had the MRI of the liver which is showed liver progression. ? ?Again I have to make another change with his protocol.  Again, he is in good shape.  He has he has a good performance status still.  Because this, I do think it be worthwhile trying him on another protocol. ? ?The one problem that we may have is that there really is not a lot of information as to what protocol would be effective.  The Hurthle cell carcinoma is quite rare. ? ?I think maybe we could consider him on the PIG protocol.   This has cis-platinum/ifosfamide/gemcitabine.  I will think he is at any of these agents.  I know this is very effective for certain cancers. ? ?The testosterone is making him feel better at least.  His quality of life and performance status are doing a little bit better. ? ?Currently, I would not say his performance status is probably ECOG 1.    ? ? ?Medications:  ?Allergies as of 05/22/2021   ? ?   Reactions  ? Dextromethorphan-guaifenesin Other (See Comments)  ? Irregular heartbeat  ? Doxycycline Anaphylaxis, Nausea And Vomiting, Rash, Other (See Comments)  ? "heart arrythmia" and "dyspepsia" ?(only oral doxycycline causes reaction)  ? Gadobutrol Hives, Other (See Comments)  ? Patient had MRI scan at [place] [facility]. Patient called one hour after he left imaging facility to report two "blisters" that came up on "back" of lip.   ? Gadolinium Derivatives Hives  ? Patient had MRI scan at [place]. Patient called one hour after he left imaging facility to report two "blisters" that came up on "back" of lip.   ? Guaifenesin Palpitations  ?   ? Ibuprofen Hives, Itching  ? Lisinopril Other (See Comments)  ? Angioedema  ? Pantoprazole Itching  ? Tramadol Hives,  Itching  ? Barium Rash  ? Developed redness around neck after drinking 1st bottle of Barocat; pt was given Benedryl by ED Mds to be "on the safe side" before drinking 2nd bottle  ? Chlorhexidine Hives  ? ?  ? ?  ?Medication List  ?  ? ?  ? Accurate as of May 22, 2021 10:24 AM. If you have any questions, ask your nurse or doctor.  ?  ?  ? ?  ? ?cyclobenzaprine 10 MG tablet ?Commonly known as: FLEXERIL ?Take 1 tablet (10 mg total) by mouth 3 (three) times daily as needed. ?  ?diphenhydrAMINE 25 mg capsule ?Commonly known as: BENADRYL ?TAKE 1 CAPSULE BY MOUTH 1 HOUR BEFORE CT SCAN ?  ?Eliquis 5 MG Tabs tablet ?Generic drug: apixaban ?TAKE 1 TABLET (5 MG TOTAL) BY MOUTH 2 (TWO) TIMES DAILY. ?  ?EPINEPHrine 0.3 mg/0.3 mL Soaj injection ?Commonly known as:  EpiPen 2-Pak ?USE AS DIRECTED FOR LIFE THREATENING ALLERGIC REACTIONS ?  ?famotidine 40 MG tablet ?Commonly known as: PEPCID ?Take 1 tablet (40 mg total) by mouth 2 (two) times daily. ?  ?Fusion Plus Caps ?Take 1 capsule by mouth daily. ?  ?HYDROmorphone 8 MG tablet ?Commonly known as: DILAUDID ?Take 1 tablet (8 mg total) by mouth every 4 (four) hours as needed for severe pain. ?  ?levothyroxine 200 MCG tablet ?Commonly known as: SYNTHROID ?Take one tablet (200 mcg dose) by mouth daily. Total:  288 mcg/day. ?  ?LORazepam 1 MG tablet ?Commonly known as: ATIVAN ?Take 1 tablet (1 mg total) by mouth every 6 (six) hours as needed for anxiety. ?  ?methylphenidate 10 MG tablet ?Commonly known as: RITALIN ?Take 1 tablet (10 mg total) by mouth 2 (two) times daily. ?  ?metoCLOPramide 10 MG tablet ?Commonly known as: REGLAN ?Take 1 tablet (10 mg total) by mouth every 6 (six) hours as needed for nausea or vomiting. ?  ?metoprolol tartrate 50 MG tablet ?Commonly known as: Lopressor ?Take 1 tablet (50 mg total) by mouth 2 (two) times daily. ?  ?multivitamin capsule ?Take 1 capsule by mouth daily. ?  ?OLANZapine 10 MG tablet ?Commonly known as: ZYPREXA ?TAKE 1 TABLET (10 MG TOTAL) BY MOUTH AT BEDTIME. ?  ?polyethylene glycol 17 g packet ?Commonly known as: MIRALAX / GLYCOLAX ?Take 17 g by mouth daily as needed for mild constipation. ?  ?potassium chloride SA 20 MEQ tablet ?Commonly known as: KLOR-CON M ?Take 1 tablet (20 mEq total) by mouth daily. ?  ?predniSONE 50 MG tablet ?Commonly known as: DELTASONE ?TAKE 1 TABLET BY MOUTH 13 HOURS BEFORE CT SCAN, 1 TABLET 7 HOURS BEFORE CT SCAN, AND 1 TABLET 1 HOUR BEFORE CT SCAN ?  ?prochlorperazine 10 MG tablet ?Commonly known as: COMPAZINE ?Take 1 tablet (10 mg total) by mouth every 6 (six) hours as needed (Nausea or vomiting). ?  ?promethazine 25 MG tablet ?Commonly known as: PHENERGAN ?TAKE 1 TABLET BY MOUTH EVERY 6 HOURS AS NEEDED FOR NAUSEA & VOMITING ?  ?senna-docusate 8.6-50 MG  tablet ?Commonly known as: Senokot-S ?Take 1 tablet by mouth at bedtime as needed for mild constipation. ?  ?temazepam 30 MG capsule ?Commonly known as: RESTORIL ?Take 1 capsule (30 mg total) by mouth at bedtime as needed for sleep. ?  ?Xtampza ER 36 MG C12a ?Generic drug: oxyCODONE ER ?Take 1 capsule (36 mg total) by mouth every 12 (twelve) hours ?  ? ?  ? ? ?Allergies:  ?Allergies  ?Allergen Reactions  ? Dextromethorphan-Guaifenesin Other (See Comments)  ?  Irregular heartbeat ?  ? Doxycycline Anaphylaxis, Nausea And Vomiting, Rash and Other (See Comments)  ?  "heart arrythmia" and "dyspepsia" ?(only oral doxycycline causes reaction)  ? Gadobutrol Hives and Other (See Comments)  ?  Patient had MRI scan at [facility]. Patient called one hour after he left imaging facility to report two "blisters" that came up on "back" of lip.  ?  ? Gadolinium Derivatives Hives  ?  Patient had MRI scan at [facility]. Patient called one hour after he left imaging facility to report two "blisters" that came up on "back" of lip.   ? Guaifenesin Palpitations  ?   ?  ? Ibuprofen Hives and Itching  ? Lisinopril Other (See Comments)  ?  Angioedema  ? Pantoprazole Itching  ? Tramadol Hives and Itching  ? Barium Rash  ?  Developed redness around neck after drinking 1st bottle of Barocat; pt was given Benedryl by ED Mds to be "on the safe side" before drinking 2nd bottle  ? Chlorhexidine Hives  ? ? ?Past Medical History, Surgical history, Social history, and Family History were reviewed and updated. ? ?Review of Systems: ?Review of Systems  ?Constitutional:  Positive for malaise/fatigue.  ?HENT: Negative.    ?Eyes: Negative.   ?Respiratory: Negative.    ?Cardiovascular: Negative.   ?Gastrointestinal:  Positive for abdominal pain, constipation, heartburn and nausea.  ?Genitourinary: Negative.   ?Musculoskeletal:  Positive for joint pain.  ?Skin: Negative.   ?Neurological: Negative.   ?Endo/Heme/Allergies: Negative.    ?Psychiatric/Behavioral: Negative.    ? ? ?Physical Exam: ? height is _0  (1.778 m) and weight is 261 lb (118.4 kg). His oral temperature is 98.4 ?F (36.9 ?C). His blood pressure is 105/66 and his pulse is 76. His r

## 2021-05-22 NOTE — Addendum Note (Signed)
Addended by: Burney Gauze R on: 05/22/2021 05:16 PM ? ? Modules accepted: Orders ? ?

## 2021-05-22 NOTE — Addendum Note (Signed)
Addended by: Randolm Idol on: 05/22/2021 10:29 AM ? ? Modules accepted: Orders ? ?

## 2021-05-22 NOTE — Patient Instructions (Signed)

## 2021-05-23 LAB — TESTOSTERONE: Testosterone: 569 ng/dL (ref 264–916)

## 2021-05-23 NOTE — Progress Notes (Signed)
Histology and Location of Primary Cancer: thyroid  ?A. Right thyroid lobe, lobectomy, outside case, [phone], [facility], [place], [place], date of procedure 11/30/17:  ?Oncocytic (Hurthle cell) carcinoma of thyroid ?  ?B. Mass right neck, excisional biopsy, outside case, [id number], [facility], [place], [place], date of procedure 04/12/18:   ?Metastatic follicular carcinoma of thyroid. ? ?Sites of Visceral and Bony Metastatic Disease: liver, portacaval lymph node, left hip, left ilium, thoracic spine, left shoulder  ? ?Location(s) of Symptomatic Metastases: left shoulder ? ?Past/Anticipated chemotherapy by medical oncology, if any:  ?Past Therapy: ?Cabometyx 40 mg po q day -- start on 04/17/2019 - d/c on 07/17/2019 for progression ?Lenvima 24 mg po q day -- on hold due to proteinuria ?Taxotere/CDDP -- s/p cycle 4 - started on 09/13/2019 ?  ?Current Therapy:     ?  ?Vinblastine q week x 4 weeks on / 1 week off -- s/p  cycle #2 -- start on 02/24/2021 -- d/c on 05/20/2021 ?Gavreto 400 mg po q day -- started on 05/17/2020  -- d/c on 06/16/2020 ?Adriamycin weekly 2 weeks on 1 week off - s/p cycle 8 --d/c on 04/16/2020 due to progression ?XRT for LEFT hip met ?Pembrolizumab 200 mg IV q 3 week -- s/p cycle #8 - start on 06/26/2020 -- d/c on 01/16/2021 due to progression ?Zometa 4 mg IV q 3 months - next dose on 05/2021 ?Eliquis 5 mg po BID ? ? ? ?Pain on a scale of 0-10 is: 6 at rest 9 with activity to left shoulder and bilateral ribs ? ? ?If Spine Met(s), symptoms, if any, include: ?Bowel/Bladder retention or incontinence (please describe): denies ?Numbness or weakness in extremities (please describe): denies ?Current Decadron regimen, if applicable: n/a ? ?Ambulatory status? Walker? Wheelchair?: Ambulatory ? ?SAFETY ISSUES: ?Prior radiation?  ? 02/24/21-03/07/21:   ?T4-T6  30 Gy in 10 fractions  ?left hip, including re-treatment of the left iliac lesion treated to 30 Gy in 10 fractions  ?  ?07/16/20 - 07/29/20: The  target in the left hip/ilium was treated to 30 Gy in 10 fractions. ?  ?04/15/20 - 04/26/20:  The liver nodule and portacaval lymph node were treated to a prescription dose of 30 Gy in 10 fractions of 3 Gy each. ?Pacemaker/ICD? no ?Possible current pregnancy? no ?Is the patient on methotrexate? no ? ?Current Complaints / other details:  nothing of note ? ?Vitals:  ? 05/28/21 0820  ?BP: 117/80  ?Pulse: 81  ?Resp: 20  ?Temp: 97.8 ?F (36.6 ?C)  ?SpO2: 99%  ?Weight: 261 lb 6.4 oz (118.6 kg)  ?Height: 5' 10" (1.778 m)  ? ? ? ?

## 2021-05-27 NOTE — Progress Notes (Signed)
?Radiation Oncology         [phone] ?________________________________ ? ?Outpatient Re-Consultation ? ?Name: Brian [name] MRN: [id number]  ?Date: 05/28/2021  DOB: 1975/06/10 ? ?[id number], Brian Reaper, MD  [name], MD  ? ?REFERRING PHYSICIAN: [name], MD ? ?DIAGNOSIS: The primary encounter diagnosis was Primary cancer of thyroid with metastasis to other site [facility]). A diagnosis of Malignant neoplasm metastatic to liver [facility] was also pertinent to this visit. ? 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 ? Cancer Staging  ?Primary cancer of thyroid with metastasis to other site [facility]) ?Staging form: Thyroid - Anaplastic, AJCC 8th Edition ?- Clinical stage from 04/12/2020: Stage IVC (rcTX, cN1b, pM1) - Signed by [name], MD on 04/12/2020 ? ? ?HISTORY OF PRESENT ILLNESS::Brian [name] is a 46 y.o. male who is seen as a courtesy of Dr. Marin [name] for re-evaluation and an opinion concerning radiation therapy as part of management for his recent progression of bony metastasis from thyroid cancer primary (left shoulder in particular). The patient was initially diagnosed with thyroid cancer in 2020, and had been followed by Dr. Marin [name] since. The patient has a very rare form of thyroid cancer which required extensive diagnostic workup. He eventually developed metastatic disease (liver, nodes, bones) with multiple progressions over the past 3 years, and has undergone 3 rounds of radiation therapy under Dr. Tammi [name] (other therapies detailed below). In 2020, the patient was also diagnosed with lung cancer, adenocarcinoma. His recent disease history is detailed as follows. ? ?Restaging PET ordered by Dr. Marin [name] on 05/16/21 showed multifocal progressive disease, detailed as follows:  ?Lungs - hypermetabolic pulmonary metastatic nodules  ?-- a left apical nodule measuring 10 mm, SUV max of 14.2, previously measuring 6 mm, SUV max of 4.84  ?-- 10 mm posterior  left upper lobe nodule with an SUV max of 11.84, previously measured 4.5 mm and was not ?hypermetabolic ?-- 10 mm right upper lobe nodule with an SUV max of 9.74, previously measured 9 mm and had an SUV max of 7.62 ?-- slight enlargement of smaller scattered bilateral pulmonary nodules  ?-- No enlarged or hypermetabolic mediastinal or hilar lymph nodes, or supraclavicular or axillary adenopathy were appreciated ?Abdomen/Pelvis - progressive hepatic metastatic disease  ?-- a dominant upper right hepatic lobe mass measuring 13 cm with an SUV max of 33.77, previously measured 9 cm and had an SUV max of 26.73 ?-- 6 cm left hepatic lobe lesion with an SUV max of 25.52, previously measured 1.7 cm and had an SUV max of 17.39 ?-- Right hepatic lobe lesion posteriorly measuring 5.5 cm with an SUV max of 40.66, previously measured 2.5 cm and had an SUV max of 25.94. ?-- Progressive hypermetabolic lymphadenopathy including: a 2.3 cm celiac axis node with an SUV max of 31.18, previously measured 2.4 cm and had an SUV max of 19.28; an inter aortocaval node measuring 2.2 cm with an SUV max of 25.68, previously measured 11.5 mm and had an SUV max of 19.10. ?Skeleton ?-- New partially lytic hypermetabolic bone lesion noted involving the C6 vertebral body, with an SUV of 34.39 ?-- Progressive lesion involving the left glenoid with an SUV max of 30.44, previously 3.48 ?-- Stable sized lytic lesion involving the left iliac bone with an SUV max of 6.31, previously 18.76 ?-- Improvement of a lytic lesion involving the posterior left acetabulum with an SUV max of 6.71, previously 19.81.  ?-- A T6 lesion  with slight improvement and appearing more sclerotic, with an SUV max of 5.18, previously 10.42.  ? ?MRI of the liver on 05/19/21 again showed interval progression of diffuse hepatic metastatic disease. Bony metastases were also again noted, as well as a slight decrease in metastatic lymphadenopathy within the porta hepatis. ? ?During his  most recent follow-up with Dr. Marin [name] on 05/22/21, the patient's main complaint was of bony pain, mainly in his left shoulder. Despite this, the patient was noted to have a overall good performance status. In addition to pursing palliative radiation to his left shoulder, Dr. Marin [name] also discussed considering cis-platinum/ifosfamide/gemcitabine. (Vinblastine was discontinued due to disease progression on 05/20/21 as detailed below). Of note: the patient is also currently on eliquis which he is tolerating well.  ? ?PREVIOUS RADIATION THERAPY: Yes, under Dr. Tammi [name]:  ? ?3) ?Diagnosis: Progressive metastatic thyroid cancer with painful disease in the thoracic spine and left hip/pelvis.    ?Indication for treatment:  Palliation      ?Radiation treatment dates:   02/24/21-03/07/21 ?Site/dose:    ?T4-T6  30 Gy in 10 fractions  ?left hip, including re-treatment of the left iliac lesion 30 Gy in 10 fractions  ?Beams/energy:    ?3D conformal with ant, post and DCA ?3D conformal with ant, post, post RF and post oblique ? ?2) ?Diagnosis: Progressive metastatic thyroid cancer involving the liver, portacaval lymph node and a new lesion in the left hip/ilium.    ?Indication for treatment: Palliation      ?Radiation treatment dates:   07/16/20 - 07/29/20 ?Site/dose:   The target in the left hip/ilium was treated to 30 Gy in 10 fractions. ?Beams/energy:   A 3D field set-up was employed with 6 MV X-rays ? ?1) ?Diagnosis:  Progressive metastatic thyroid cancer involving the liver and a portacaval lymph node.    ?Indication for treatment:  Palliative      ?Radiation treatment dates:   04/15/20 - 04/26/20 ?Site/dose:   The liver nodule and portacaval lymph node were treated to a prescription dose of 30 Gy in 10 fractions of 3 Gy each. ?Beams/energy:   A 3D field set-up was employed with 6 MV X-rays ? ?OTHER PREVIOUS THERAPIES (in order):   ?-- Lenvima 24 mg po q day in 2020 -- stopped in November 2020 and restarted in February of 2021 ?--  Cabometyx 40 mg po q day -- start on 04/17/2019 -- d/c on 07/17/2019 for progression ?-- Gavreto 400 mg po q day ?-- Taxotere/CDDP -- started on 09/13/2019; s/p 4 cycles  ?-- Adriamycin weekly 3 weeks on 1 week off -- s/p 8 cycles -- d/c on 04/16/2020 due to progression ?-- Gavreto 400 mg PO daily - re-started on 05/17/2020 ?-- Pembrolizumab 200 mg IV q 3 week -- start on 06/26/2020 -- d/c on 01/16/2021 due to progression ?-- Zometa 4 mg IV q 3 months  -- start on 07/17/2020 ?-- Vinblastine q week x 4 weeks on / 1 week off -- start on 02/24/2021 -- d/c on 05/20/2021 ? ?PAST MEDICAL HISTORY:  ?Past Medical History:  ?Diagnosis Date  ? Chronic diastolic CHF (congestive heart failure) ([place]) 04/11/2020  ? DVT, lower extremity, distal, acute, left ([place]) 01/14/2020  ? Essential hypertension 09/09/2018  ? Goals of care, counseling/discussion 01/14/2020  ? Hypotestosteronemia in male 04/04/2021  ? Kidney stone   ? Primary cancer of thyroid with metastasis to other site [facility]) 05/04/2018  ? Rickettsia infection   ? ? ?PAST SURGICAL HISTORY: ?Past Surgical History:  ?Procedure  Laterality Date  ? BIOPSY  04/05/2018  ? Procedure: BIOPSY;  Surgeon: Ronald Lobo, MD;  Location: [facility];  Service: Endoscopy;;  ? CHOLECYSTECTOMY    ? ESOPHAGOGASTRODUODENOSCOPY (EGD) WITH PROPOFOL N/A 04/05/2018  ? Procedure: ESOPHAGOGASTRODUODENOSCOPY (EGD) WITH PROPOFOL;  Surgeon: Ronald Lobo, MD;  Location: [facility] ENDOS[facility];  Service: Endoscopy;  Laterality: N/A;  ? IR IMAGING GUIDED PORT INSERTION  09/13/2019  ? IR RADIOLOGIST EVAL & MGMT  10/11/2018  ? ? ?FAMILY HISTORY:  ?Family History  ?Problem Relation Age of Onset  ? Diabetes Mother   ? Heart disease Mother   ? Heart failure Mother   ? Heart attack Mother   ? Hyperlipidemia Mother   ? Hypertension Mother   ? Hyperlipidemia Father   ? Allergic rhinitis Father   ? Rashes / Skin problems Father   ? Sudden death Neg Hx   ? Thyroid disease Neg Hx   ? ? ?SOCIAL HISTORY:  ?Social History  ? ?Tobacco  Use  ? Smoking status: Never  ? Smokeless tobacco: Never  ?Vaping Use  ? Vaping Use: Never used  ?Substance Use Topics  ? Alcohol use: No  ? Drug use: No  ? ? ?ALLERGIES:  ?Allergies  ?Allergen Reactio

## 2021-05-28 ENCOUNTER — Ambulatory Visit
Admission: RE | Admit: 2021-05-28 | Discharge: 2021-05-28 | Disposition: A | Discharge status: Home / Self Care | Payer: BC Managed Care – PPO | Source: Ambulatory Visit | Attending: Radiation Oncology | Admitting: Radiation Oncology

## 2021-05-28 ENCOUNTER — Encounter: Payer: Self-pay | Admitting: Radiation Oncology

## 2021-05-28 ENCOUNTER — Other Ambulatory Visit: Payer: Self-pay

## 2021-05-28 VITALS — BP 117/80 | HR 81 | Temp 97.8°F | Resp 20 | Ht 70.0 in | Wt 261.4 lb

## 2021-05-28 DIAGNOSIS — M25512 Pain in left shoulder: Secondary | ICD-10-CM | POA: Diagnosis not present

## 2021-05-28 DIAGNOSIS — I110 Hypertensive heart disease with heart failure: Secondary | ICD-10-CM | POA: Insufficient documentation

## 2021-05-28 DIAGNOSIS — C799 Secondary malignant neoplasm of unspecified site: Secondary | ICD-10-CM | POA: Insufficient documentation

## 2021-05-28 DIAGNOSIS — C787 Secondary malignant neoplasm of liver and intrahepatic bile duct: Secondary | ICD-10-CM | POA: Diagnosis not present

## 2021-05-28 DIAGNOSIS — C7800 Secondary malignant neoplasm of unspecified lung: Secondary | ICD-10-CM | POA: Diagnosis not present

## 2021-05-28 DIAGNOSIS — Z79899 Other long term (current) drug therapy: Secondary | ICD-10-CM | POA: Insufficient documentation

## 2021-05-28 DIAGNOSIS — Z86718 Personal history of other venous thrombosis and embolism: Secondary | ICD-10-CM | POA: Diagnosis not present

## 2021-05-28 DIAGNOSIS — C73 Malignant neoplasm of thyroid gland: Secondary | ICD-10-CM | POA: Insufficient documentation

## 2021-05-28 DIAGNOSIS — Z7901 Long term (current) use of anticoagulants: Secondary | ICD-10-CM | POA: Insufficient documentation

## 2021-05-28 DIAGNOSIS — Z510 Encounter for antineoplastic radiation therapy: Secondary | ICD-10-CM | POA: Diagnosis not present

## 2021-05-28 DIAGNOSIS — I5032 Chronic diastolic (congestive) heart failure: Secondary | ICD-10-CM | POA: Insufficient documentation

## 2021-05-28 DIAGNOSIS — C7951 Secondary malignant neoplasm of bone: Secondary | ICD-10-CM | POA: Insufficient documentation

## 2021-05-28 NOTE — Progress Notes (Signed)
See MD note for nursing evaluation. °

## 2021-05-29 ENCOUNTER — Ambulatory Visit
Admission: RE | Admit: 2021-05-29 | Discharge: 2021-05-29 | Disposition: A | Discharge status: Home / Self Care | Payer: BC Managed Care – PPO | Source: Ambulatory Visit | Attending: Radiation Oncology | Admitting: Radiation Oncology

## 2021-05-29 ENCOUNTER — Other Ambulatory Visit: Payer: Self-pay | Admitting: Hematology & Oncology

## 2021-05-29 DIAGNOSIS — C73 Malignant neoplasm of thyroid gland: Secondary | ICD-10-CM

## 2021-05-29 DIAGNOSIS — Z510 Encounter for antineoplastic radiation therapy: Secondary | ICD-10-CM | POA: Diagnosis not present

## 2021-05-30 ENCOUNTER — Other Ambulatory Visit: Payer: Self-pay

## 2021-05-30 ENCOUNTER — Ambulatory Visit
Admission: RE | Admit: 2021-05-30 | Discharge: 2021-05-30 | Disposition: A | Discharge status: Home / Self Care | Payer: BC Managed Care – PPO | Source: Ambulatory Visit | Attending: Radiation Oncology | Admitting: Radiation Oncology

## 2021-05-30 DIAGNOSIS — Z510 Encounter for antineoplastic radiation therapy: Secondary | ICD-10-CM | POA: Diagnosis not present

## 2021-06-02 ENCOUNTER — Other Ambulatory Visit: Payer: Self-pay

## 2021-06-02 ENCOUNTER — Other Ambulatory Visit: Payer: Self-pay | Admitting: Hematology & Oncology

## 2021-06-02 ENCOUNTER — Telehealth: Payer: Self-pay | Admitting: *Deleted

## 2021-06-02 ENCOUNTER — Ambulatory Visit (HOSPITAL_BASED_OUTPATIENT_CLINIC_OR_DEPARTMENT_OTHER)
Admission: RE | Admit: 2021-06-02 | Discharge: 2021-06-02 | Disposition: A | Discharge status: Home / Self Care | Payer: BC Managed Care – PPO | Source: Ambulatory Visit | Attending: Family | Admitting: Family

## 2021-06-02 ENCOUNTER — Ambulatory Visit
Admission: RE | Admit: 2021-06-02 | Discharge: 2021-06-02 | Disposition: A | Discharge status: Home / Self Care | Payer: BC Managed Care – PPO | Source: Ambulatory Visit | Attending: Radiation Oncology | Admitting: Radiation Oncology

## 2021-06-02 ENCOUNTER — Other Ambulatory Visit: Payer: Self-pay | Admitting: Family

## 2021-06-02 ENCOUNTER — Encounter: Payer: Self-pay | Admitting: *Deleted

## 2021-06-02 DIAGNOSIS — Z510 Encounter for antineoplastic radiation therapy: Secondary | ICD-10-CM | POA: Diagnosis not present

## 2021-06-02 DIAGNOSIS — M25511 Pain in right shoulder: Secondary | ICD-10-CM

## 2021-06-02 DIAGNOSIS — C73 Malignant neoplasm of thyroid gland: Secondary | ICD-10-CM | POA: Insufficient documentation

## 2021-06-02 DIAGNOSIS — C799 Secondary malignant neoplasm of unspecified site: Secondary | ICD-10-CM | POA: Insufficient documentation

## 2021-06-02 NOTE — Telephone Encounter (Signed)
Patient called stating that he is having radiation on his left shoulder but is experiencing extreme pain in his right shoulder.  Inquiring about imaging studies that could be done.  Plain films of right should ordered.  Called patient to let him know.  Spoke to his father and let him know the orders are in.   ?

## 2021-06-03 ENCOUNTER — Ambulatory Visit
Admission: RE | Admit: 2021-06-03 | Discharge: 2021-06-03 | Disposition: A | Discharge status: Home / Self Care | Payer: BC Managed Care – PPO | Source: Ambulatory Visit | Attending: Radiation Oncology | Admitting: Radiation Oncology

## 2021-06-03 ENCOUNTER — Other Ambulatory Visit: Payer: Self-pay

## 2021-06-03 DIAGNOSIS — Z510 Encounter for antineoplastic radiation therapy: Secondary | ICD-10-CM | POA: Diagnosis not present

## 2021-06-03 LAB — RAD ONC ARIA SESSION SUMMARY
Course Elapsed Days: 5
Plan Fractions Treated to Date: 4
Plan Prescribed Dose Per Fraction: 3 Gy
Plan Total Fractions Prescribed: 10
Plan Total Prescribed Dose: 30 Gy
Reference Point Dosage Given to Date: 12 Gy
Reference Point Session Dosage Given: 3 Gy
Session Number: 4

## 2021-06-04 ENCOUNTER — Ambulatory Visit
Admission: RE | Admit: 2021-06-04 | Discharge: 2021-06-04 | Disposition: A | Discharge status: Home / Self Care | Payer: BC Managed Care – PPO | Source: Ambulatory Visit | Attending: Radiation Oncology | Admitting: Radiation Oncology

## 2021-06-04 ENCOUNTER — Other Ambulatory Visit: Payer: Self-pay | Admitting: Hematology & Oncology

## 2021-06-04 ENCOUNTER — Other Ambulatory Visit: Payer: Self-pay

## 2021-06-04 DIAGNOSIS — Z510 Encounter for antineoplastic radiation therapy: Secondary | ICD-10-CM | POA: Diagnosis not present

## 2021-06-04 DIAGNOSIS — C73 Malignant neoplasm of thyroid gland: Secondary | ICD-10-CM

## 2021-06-04 DIAGNOSIS — C787 Secondary malignant neoplasm of liver and intrahepatic bile duct: Secondary | ICD-10-CM

## 2021-06-04 LAB — RAD ONC ARIA SESSION SUMMARY
Course Elapsed Days: 6
Plan Fractions Treated to Date: 5
Plan Prescribed Dose Per Fraction: 3 Gy
Plan Total Fractions Prescribed: 10
Plan Total Prescribed Dose: 30 Gy
Reference Point Dosage Given to Date: 15 Gy
Reference Point Session Dosage Given: 3 Gy
Session Number: 5

## 2021-06-04 LAB — CBC WITH DIFFERENTIAL (CANCER CENTER ONLY)
Abs Immature Granulocytes: 0.02 10*3/uL (ref 0.00–0.07)
Basophils Absolute: 0 10*3/uL (ref 0.0–0.1)
Basophils Relative: 1 %
Eosinophils Absolute: 0 10*3/uL (ref 0.0–0.5)
Eosinophils Relative: 0 %
HCT: 42.3 % (ref 39.0–52.0)
Hemoglobin: 13.2 g/dL (ref 13.0–17.0)
Immature Granulocytes: 0 %
Lymphocytes Relative: 6 %
Lymphs Abs: 0.4 10*3/uL — ABNORMAL LOW (ref 0.7–4.0)
MCH: 27.9 pg (ref 26.0–34.0)
MCHC: 31.2 g/dL (ref 30.0–36.0)
MCV: 89.4 fL (ref 80.0–100.0)
Monocytes Absolute: 0.4 10*3/uL (ref 0.1–1.0)
Monocytes Relative: 8 %
Neutro Abs: 4.8 10*3/uL (ref 1.7–7.7)
Neutrophils Relative %: 85 %
Platelet Count: 289 10*3/uL (ref 150–400)
RBC: 4.73 MIL/uL (ref 4.22–5.81)
RDW: 16.8 % — ABNORMAL HIGH (ref 11.5–15.5)
WBC Count: 5.7 10*3/uL (ref 4.0–10.5)
nRBC: 0 % (ref 0.0–0.2)

## 2021-06-04 LAB — BASIC METABOLIC PANEL - CANCER CENTER ONLY
Anion gap: 9 (ref 5–15)
BUN: 17 mg/dL (ref 6–20)
CO2: 28 mmol/L (ref 22–32)
Calcium: 9.8 mg/dL (ref 8.9–10.3)
Chloride: 99 mmol/L (ref 98–111)
Creatinine: 0.97 mg/dL (ref 0.61–1.24)
GFR, Estimated: >60 mL/min (ref >60)
Glucose, Bld: 136 mg/dL — ABNORMAL HIGH (ref 70–99)
Potassium: 4.3 mmol/L (ref 3.5–5.1)
Sodium: 136 mmol/L (ref 135–145)

## 2021-06-04 MED ORDER — SORAFENIB TOSYLATE 200 MG PO TABS
400.0000 mg | ORAL_TABLET | Freq: Two times a day (BID) | ORAL | 3 refills | Status: DC
  Filled 2021-06-04 – 2021-06-05 (×2): qty 120, 30d supply, fill #0

## 2021-06-04 MED ORDER — PROMETHAZINE HCL 25 MG PO TABS
25.0000 mg | ORAL_TABLET | Freq: Once | ORAL | Status: AC
  Administered 2021-06-04: 25 mg via ORAL
  Filled 2021-06-04: qty 1

## 2021-06-05 ENCOUNTER — Ambulatory Visit
Admission: RE | Admit: 2021-06-05 | Discharge: 2021-06-05 | Disposition: A | Discharge status: Home / Self Care | Payer: BC Managed Care – PPO | Source: Ambulatory Visit | Attending: Radiation Oncology | Admitting: Radiation Oncology

## 2021-06-05 ENCOUNTER — Encounter: Payer: Self-pay | Admitting: Family

## 2021-06-05 ENCOUNTER — Encounter: Payer: Self-pay | Admitting: *Deleted

## 2021-06-05 ENCOUNTER — Other Ambulatory Visit: Payer: Self-pay

## 2021-06-05 ENCOUNTER — Telehealth: Payer: Self-pay | Admitting: Pharmacy Technician

## 2021-06-05 ENCOUNTER — Other Ambulatory Visit (HOSPITAL_COMMUNITY): Payer: Self-pay

## 2021-06-05 ENCOUNTER — Telehealth: Payer: Self-pay | Admitting: Pharmacist

## 2021-06-05 DIAGNOSIS — Z510 Encounter for antineoplastic radiation therapy: Secondary | ICD-10-CM | POA: Diagnosis not present

## 2021-06-05 LAB — RAD ONC ARIA SESSION SUMMARY
Course Elapsed Days: 7
Plan Fractions Treated to Date: 6
Plan Prescribed Dose Per Fraction: 3 Gy
Plan Total Fractions Prescribed: 10
Plan Total Prescribed Dose: 30 Gy
Reference Point Dosage Given to Date: 18 Gy
Reference Point Session Dosage Given: 3 Gy
Session Number: 6

## 2021-06-05 NOTE — Progress Notes (Signed)
Patient added on for Testosterone injection tomorrow per order of Dr. Marin Olp.  Pt notified. ?

## 2021-06-05 NOTE — Telephone Encounter (Signed)
Oral Oncology Patient Advocate Encounter ?  ?Received notification from [facility]) that prior authorization for Sorafenib (Nexavar) is required. ?  ?PA submitted on CoverMyMeds ?Key [id number]  ?Status is pending ?  ?Oral Oncology Clinic will continue to follow. ? ?Dennison Nancy CPHT ?Specialty Pharmacy Patient Advocate ?[place] ?Phone [phone] ?Fax [phone] ?06/05/2021 9:12 AM ? ?

## 2021-06-05 NOTE — Telephone Encounter (Addendum)
Oral Oncology Pharmacist Encounter ? 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 ? ?CBC w/ Diff and BMP from 06/04/21 as well as CMP from 05/22/21 assessed, no relevant lab abnormalities noted. Patient BP on 05/22/21 105/66 mmHg. Prescription dose and frequency assessed for appropriateness. Appropriate for therapy initiation.  ? ?Current medication list in Epic reviewed, no relevant/significant DDIs with Nexavar identified. ? ?Evaluated chart and no patient barriers to medication adherence noted.  ? ?Prescription has been e-scribed to the [facility] for benefits analysis and approval. ? ?Oral Oncology Clinic will continue to follow for insurance authorization, copayment issues, initial counseling and start date. ? ?Leron Croak, PharmD, BCPS ?Hematology/Oncology Clinical Pharmacist ?[facility] Oral Chemotherapy Navigation Clinics ?[phone] ?06/05/2021 8:32 AM ? ?

## 2021-06-05 NOTE — Telephone Encounter (Signed)
Oral Chemotherapy Pharmacist Encounter ? 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 ? ?Counseled patient on administration, dosing, side effects, monitoring, drug-food interactions, safe handling, storage, and disposal. ? ?Patient will take Nexavar 200mg  tablets, 2 tablets (400mg ) by mouth 2 times daily on an empty stomach, 1 hour before of 2 hours after meals ? ?Patient will separate Nexavar dosing by 10-12 hours each day. ? ?Nexavar start date: 06/06/21 PM ? ?Adverse effects include but are not limited to: hypertension, fatigue, hand-foot syndrome, skin rash, diarrhea, nausea, anorexia, lab abnormalities, cardiac conduction changes, hypothyroidism, and wound healing complications.   ?  ?Patient will obtain anti diarrheal and alert the office of 4 or more loose stools above baseline. ? ?Reviewed with patient importance of keeping a medication schedule and plan for any missed doses. No barriers to medication adherence identified. ? ?Medication reconciliation performed and medication/allergy list updated. ? ?Insurance authorization for Nexavar has been obtained. ?Test claim at the pharmacy revealed copayment $0 for 1st fill of Nexavar. ?This will ship from the [facility] on 06/06/21 for same day delivery. ? ?Patient informed the pharmacy will reach out 5-7 days prior to needing next fill of Nexavar to coordinate continued medication acquisition to prevent break in therapy. ? ?All questions answered. ? ?Mr. Budden voiced understanding and appreciation.  ? ?Medication education handout will be placed at the front desk of [facility] for patient to pick up on 06/06/21. Patient knows to call the office with questions or concerns. Oral Chemotherapy Clinic phone number provided to patient.  ? ?Leron Croak, PharmD, BCPS ?Hematology/Oncology Clinical  Pharmacist ?[facility] Oral Chemotherapy Navigation Clinics ?[phone] ?06/05/2021 1:20 PM ? ?

## 2021-06-05 NOTE — Telephone Encounter (Signed)
Oral Oncology Patient Advocate Encounter ? ?Prior Authorization for Sorafenib has been approved.   ? ?PA# [id number] ?Effective dates: 06/05/21 through 06/06/22 ? ?Patients co-pay is $0.00. ? ?Oral Oncology Clinic will continue to follow.  ? ?Dennison Nancy CPHT ?Specialty Pharmacy Patient Advocate ?[place] ?Phone [phone] ?Fax [phone] ?06/05/2021 9:12 AM ? ?

## 2021-06-06 ENCOUNTER — Ambulatory Visit: Payer: BC Managed Care – PPO

## 2021-06-06 ENCOUNTER — Other Ambulatory Visit: Payer: Self-pay | Admitting: Hematology & Oncology

## 2021-06-06 ENCOUNTER — Inpatient Hospital Stay: Payer: BC Managed Care – PPO

## 2021-06-06 ENCOUNTER — Encounter: Payer: Self-pay | Admitting: Family

## 2021-06-06 ENCOUNTER — Other Ambulatory Visit (HOSPITAL_BASED_OUTPATIENT_CLINIC_OR_DEPARTMENT_OTHER): Payer: Self-pay

## 2021-06-06 ENCOUNTER — Ambulatory Visit: Payer: BC Managed Care – PPO | Admitting: Urology

## 2021-06-06 ENCOUNTER — Ambulatory Visit
Admission: RE | Admit: 2021-06-06 | Discharge: 2021-06-06 | Disposition: A | Discharge status: Home / Self Care | Payer: BC Managed Care – PPO | Source: Ambulatory Visit | Attending: Radiation Oncology | Admitting: Radiation Oncology

## 2021-06-06 ENCOUNTER — Other Ambulatory Visit: Payer: Self-pay

## 2021-06-06 ENCOUNTER — Other Ambulatory Visit (HOSPITAL_COMMUNITY): Payer: Self-pay

## 2021-06-06 VITALS — BP 110/79 | HR 80 | Temp 97.9°F | Resp 19

## 2021-06-06 DIAGNOSIS — G893 Neoplasm related pain (acute) (chronic): Secondary | ICD-10-CM

## 2021-06-06 DIAGNOSIS — M546 Pain in thoracic spine: Secondary | ICD-10-CM

## 2021-06-06 DIAGNOSIS — R110 Nausea: Secondary | ICD-10-CM

## 2021-06-06 DIAGNOSIS — E291 Testicular hypofunction: Secondary | ICD-10-CM

## 2021-06-06 DIAGNOSIS — C73 Malignant neoplasm of thyroid gland: Secondary | ICD-10-CM

## 2021-06-06 DIAGNOSIS — C787 Secondary malignant neoplasm of liver and intrahepatic bile duct: Secondary | ICD-10-CM

## 2021-06-06 DIAGNOSIS — Z510 Encounter for antineoplastic radiation therapy: Secondary | ICD-10-CM | POA: Diagnosis not present

## 2021-06-06 DIAGNOSIS — D509 Iron deficiency anemia, unspecified: Secondary | ICD-10-CM

## 2021-06-06 LAB — RAD ONC ARIA SESSION SUMMARY
Course Elapsed Days: 8
Plan Fractions Treated to Date: 7
Plan Prescribed Dose Per Fraction: 3 Gy
Plan Total Fractions Prescribed: 10
Plan Total Prescribed Dose: 30 Gy
Reference Point Dosage Given to Date: 21 Gy
Reference Point Session Dosage Given: 3 Gy
Session Number: 7

## 2021-06-06 MED ORDER — PROCHLORPERAZINE MALEATE 10 MG PO TABS
10.0000 mg | ORAL_TABLET | Freq: Four times a day (QID) | ORAL | 3 refills | Status: DC | PRN
  Filled 2021-06-06: qty 30, 8d supply, fill #0
  Filled 2021-06-25: qty 30, 8d supply, fill #1

## 2021-06-06 MED ORDER — XTAMPZA ER 36 MG PO C12A
36.0000 mg | EXTENDED_RELEASE_CAPSULE | Freq: Two times a day (BID) | ORAL | 0 refills | Status: DC
  Filled 2021-06-06: qty 60, 30d supply, fill #0

## 2021-06-06 MED ORDER — TEMAZEPAM 30 MG PO CAPS
30.0000 mg | ORAL_CAPSULE | Freq: Every evening | ORAL | 0 refills | Status: DC | PRN
  Filled 2021-06-06: qty 30, 30d supply, fill #0

## 2021-06-06 MED ORDER — TESTOSTERONE CYPIONATE 200 MG/ML IM SOLN
400.0000 mg | INTRAMUSCULAR | Status: DC
  Administered 2021-06-06: 400 mg via INTRAMUSCULAR
  Filled 2021-06-06: qty 2

## 2021-06-06 MED ORDER — LORAZEPAM 1 MG PO TABS
1.0000 mg | ORAL_TABLET | Freq: Four times a day (QID) | ORAL | 0 refills | Status: DC | PRN
  Filled 2021-06-06: qty 30, 8d supply, fill #0

## 2021-06-06 MED ORDER — METHYLPHENIDATE HCL 10 MG PO TABS
10.0000 mg | ORAL_TABLET | Freq: Two times a day (BID) | ORAL | 0 refills | Status: DC
  Filled 2021-06-06: qty 60, 30d supply, fill #0

## 2021-06-06 NOTE — Telephone Encounter (Signed)
Oral Oncology Patient Advocate Encounter ? ?I spoke with Mr Brian [name] on 06/05/21 to set up delivery of Sorafenib (Nexavar).  Address verified for shipment.  Sorafenib will be filled through [facility] and shipped 06/06/21 for same day delivery. ? ?Brian [name] will call 7-10 days before next refill is due to complete adherence call and set up delivery of medication.    ? ?Brian [name] CPHT ?Specialty Pharmacy Patient Advocate ?[place] ?Phone [phone] ?Fax [phone] ?06/06/2021 11:34 AM ? ? ?

## 2021-06-06 NOTE — Patient Instructions (Signed)
Testosterone Injection ?What is this medication? ?TESTOSTERONE (tes TOS ter one) is used to increase testosterone levels in your body. It belongs to a group of medications called androgen hormones. ?This medicine may be used for other purposes; ask your health care provider or pharmacist if you have questions. ?COMMON BRAND NAME(S): Andro-L.A., Aveed, Delatestryl, Depo-Testosterone, Virilon ?What should I tell my care team before I take this medication? ?They need to know if you have any of these conditions: ?Cancer ?Diabetes ?Heart disease ?Kidney disease ?Liver disease ?Lung disease ?Prostate disease ?An unusual or allergic reaction to testosterone, other medications, foods, dyes, or preservatives ?If a male partner is pregnant or trying to get pregnant ?Breast-feeding ?How should I use this medication? ?This medication is for injection into a muscle. It is usually given in a hospital or clinic. ?A special MedGuide will be given to you by the pharmacist with each prescription and refill. Be sure to read this information carefully each time. ?Contact your care team regarding the use of this medication in children. While this medication may be prescribed for children as young as 66 years of age for selected conditions, precautions do apply. ?Overdosage: If you think you have taken too much of this medicine contact a poison control center or emergency room at once. ?NOTE: This medicine is only for you. Do not share this medicine with others. ?What if I miss a dose? ?Try not to miss a dose. Your care team will tell you when your next injection is due. Notify the office if you are unable to keep an appointment. ?What may interact with this medication? ?Medications for diabetes ?Medications that treat or prevent blood clots like warfarin ?Oxyphenbutazone ?Propranolol ?Steroid medications like prednisone or cortisone ?This list may not describe all possible interactions. Give your health care provider a list of all the  medicines, herbs, non-prescription drugs, or dietary supplements you use. Also tell them if you smoke, drink alcohol, or use illegal drugs. Some items may interact with your medicine. ?What should I watch for while using this medication? ?Visit your care team for regular checks on your progress. They will need to check the level of testosterone in your blood. ?This medication is only approved for use in men who have low levels of testosterone related to certain medical conditions. Heart attacks and strokes have been reported with the use of this medication. Notify your care team and seek emergency treatment if you develop breathing problems; changes in vision; confusion; chest pain or chest tightness; sudden arm pain; severe, sudden headache; trouble speaking or understanding; sudden numbness or weakness of the face, arm or leg; loss of balance or coordination. Talk to your care team about the risks and benefits of this medication. ?This medication may affect blood sugar levels. If you have diabetes, check with your care team before you change your diet or the dose of your diabetic medication. ?Testosterone injections are not commonly used in women. Women should inform their care team if they wish to become pregnant or think they might be pregnant. There is a potential for serious side effects to an unborn child. Talk to your care team or pharmacist for more information. Talk with your care team about your birth control options while taking this medication. ?This medication is banned from use in athletes by most athletic organizations. ?What side effects may I notice from receiving this medication? ?Side effects that you should report to your care team as soon as possible: ?Allergic reactions--skin rash, itching, hives, swelling of the  face, lips, tongue, or throat ?Blood clot--pain, swelling, or warmth in the leg, shortness of breath, chest pain ?Heart attack--pain or tightness in the chest, shoulders, arms or jaw,  nausea, shortness of breath, cold or clammy skin, feeling faint or lightheaded ?Increase in blood pressure ?Liver injury--right upper belly pain, loss of appetite, nausea, light-colored stool, dark yellow or brown urine, yellowing of the skin or eyes, unusual weakness or fatigue ?Mood swings, irritability, or hostility ?Prolonged or painful erection ?Sleep apnea--loud snoring, gasping during sleep, daytime sleepiness ?Stroke--sudden numbness or weakness of the face, arm or leg, trouble speaking, confusion, trouble walking, loss of balance or coordination, dizziness, severe headache, change in vision ?Swelling of the ankles, hands, or feet ?Thoughts of suicide or self-harm, worsening mood, feelings of depression ?Side effects that usually do not require medical attention (report to your care team if they continue or are bothersome): ?Acne ?Change in sex drive or performance ?Pain, redness, or irritation at the application site ?Unexpected breast tissue growth ?This list may not describe all possible side effects. Call your doctor for medical advice about side effects. You may report side effects to FDA at 1-800-FDA-1088. ?Where should I keep my medication? ?Keep out of the reach of children. This medication can be abused. Keep your medication in a safe place to protect it from theft. Do not share this medication with anyone. Selling or giving away this medication is dangerous and against the law. ?Store at room temperature between 20 and 25 degrees C (68 and 77 degrees F). Do not freeze. Protect from light. Follow the directions for the product you are prescribed. Throw away any unused medication after the expiration date. ?NOTE: This sheet is a summary. It may not cover all possible information. If you have questions about this medicine, talk to your doctor, pharmacist, or health care provider. ?? 2023 Elsevier/Gold Standard (2020-01-31 00:00:00) ? ?

## 2021-06-09 ENCOUNTER — Ambulatory Visit
Admission: RE | Admit: 2021-06-09 | Discharge: 2021-06-09 | Disposition: A | Discharge status: Home / Self Care | Payer: BC Managed Care – PPO | Source: Ambulatory Visit | Attending: Radiation Oncology | Admitting: Radiation Oncology

## 2021-06-09 ENCOUNTER — Other Ambulatory Visit (HOSPITAL_BASED_OUTPATIENT_CLINIC_OR_DEPARTMENT_OTHER): Payer: Self-pay

## 2021-06-09 ENCOUNTER — Other Ambulatory Visit: Payer: Self-pay

## 2021-06-09 DIAGNOSIS — Z510 Encounter for antineoplastic radiation therapy: Secondary | ICD-10-CM | POA: Diagnosis not present

## 2021-06-09 LAB — RAD ONC ARIA SESSION SUMMARY
Course Elapsed Days: 11
Plan Fractions Treated to Date: 8
Plan Prescribed Dose Per Fraction: 3 Gy
Plan Total Fractions Prescribed: 10
Plan Total Prescribed Dose: 30 Gy
Reference Point Dosage Given to Date: 24 Gy
Reference Point Session Dosage Given: 3 Gy
Session Number: 8

## 2021-06-10 ENCOUNTER — Other Ambulatory Visit: Payer: Self-pay

## 2021-06-10 ENCOUNTER — Telehealth: Payer: Self-pay

## 2021-06-10 ENCOUNTER — Ambulatory Visit
Admission: RE | Admit: 2021-06-10 | Discharge: 2021-06-10 | Disposition: A | Discharge status: Home / Self Care | Payer: BC Managed Care – PPO | Source: Ambulatory Visit | Attending: Radiation Oncology | Admitting: Radiation Oncology

## 2021-06-10 DIAGNOSIS — Z510 Encounter for antineoplastic radiation therapy: Secondary | ICD-10-CM | POA: Diagnosis not present

## 2021-06-10 LAB — RAD ONC ARIA SESSION SUMMARY
Course Elapsed Days: 12
Plan Fractions Treated to Date: 9
Plan Prescribed Dose Per Fraction: 3 Gy
Plan Total Fractions Prescribed: 10
Plan Total Prescribed Dose: 30 Gy
Reference Point Dosage Given to Date: 27 Gy
Reference Point Session Dosage Given: 3 Gy
Session Number: 9

## 2021-06-10 NOTE — Telephone Encounter (Signed)
Received VM from pt reporting "pain over my liver" and in both shoulders. Returned call to pt who is currently at [facility] for XRT. Pt states his right shoulder "feels like it is being stabbed." This shoulder was xrayed on 4/17 with no significant findings. Pt states he will be evaluated at XRT. Dr Marin Olp out of the office this afternoon, therefore pt advised to go to the ED if he is unable to get pain relief from interventions provided by XRT. Pt verbalizes understanding and appreciation. dph ?

## 2021-06-11 ENCOUNTER — Other Ambulatory Visit: Payer: Self-pay

## 2021-06-11 ENCOUNTER — Inpatient Hospital Stay: Payer: BC Managed Care – PPO

## 2021-06-11 ENCOUNTER — Telehealth: Payer: Self-pay | Admitting: *Deleted

## 2021-06-11 ENCOUNTER — Encounter: Payer: Self-pay | Admitting: Radiation Oncology

## 2021-06-11 ENCOUNTER — Other Ambulatory Visit: Payer: Self-pay | Admitting: *Deleted

## 2021-06-11 ENCOUNTER — Ambulatory Visit
Admission: RE | Admit: 2021-06-11 | Discharge: 2021-06-11 | Disposition: A | Discharge status: Home / Self Care | Payer: BC Managed Care – PPO | Source: Ambulatory Visit | Attending: Radiation Oncology | Admitting: Radiation Oncology

## 2021-06-11 VITALS — BP 105/74 | HR 96 | Resp 17

## 2021-06-11 DIAGNOSIS — C799 Secondary malignant neoplasm of unspecified site: Secondary | ICD-10-CM

## 2021-06-11 DIAGNOSIS — Z510 Encounter for antineoplastic radiation therapy: Secondary | ICD-10-CM | POA: Diagnosis not present

## 2021-06-11 DIAGNOSIS — D509 Iron deficiency anemia, unspecified: Secondary | ICD-10-CM

## 2021-06-11 DIAGNOSIS — Z95828 Presence of other vascular implants and grafts: Secondary | ICD-10-CM

## 2021-06-11 DIAGNOSIS — E291 Testicular hypofunction: Secondary | ICD-10-CM

## 2021-06-11 DIAGNOSIS — C73 Malignant neoplasm of thyroid gland: Secondary | ICD-10-CM

## 2021-06-11 LAB — CBC WITH DIFFERENTIAL (CANCER CENTER ONLY)
Abs Immature Granulocytes: 0.07 10*3/uL (ref 0.00–0.07)
Basophils Absolute: 0 10*3/uL (ref 0.0–0.1)
Basophils Relative: 0 %
Eosinophils Absolute: 0 10*3/uL (ref 0.0–0.5)
Eosinophils Relative: 0 %
HCT: 44.1 % (ref 39.0–52.0)
Hemoglobin: 14.4 g/dL (ref 13.0–17.0)
Immature Granulocytes: 1 %
Lymphocytes Relative: 3 %
Lymphs Abs: 0.5 10*3/uL — ABNORMAL LOW (ref 0.7–4.0)
MCH: 28.3 pg (ref 26.0–34.0)
MCHC: 32.7 g/dL (ref 30.0–36.0)
MCV: 86.8 fL (ref 80.0–100.0)
Monocytes Absolute: 1.4 10*3/uL — ABNORMAL HIGH (ref 0.1–1.0)
Monocytes Relative: 9 %
Neutro Abs: 13.2 10*3/uL — ABNORMAL HIGH (ref 1.7–7.7)
Neutrophils Relative %: 87 %
Platelet Count: 233 10*3/uL (ref 150–400)
RBC: 5.08 MIL/uL (ref 4.22–5.81)
RDW: 16.6 % — ABNORMAL HIGH (ref 11.5–15.5)
WBC Count: 15.1 10*3/uL — ABNORMAL HIGH (ref 4.0–10.5)
nRBC: 0 % (ref 0.0–0.2)

## 2021-06-11 LAB — COMPREHENSIVE METABOLIC PANEL
ALT: 18 U/L (ref 0–44)
AST: 74 U/L — ABNORMAL HIGH (ref 15–41)
Albumin: 4.1 g/dL (ref 3.5–5.0)
Alkaline Phosphatase: 152 U/L — ABNORMAL HIGH (ref 38–126)
Anion gap: 10 (ref 5–15)
BUN: 25 mg/dL — ABNORMAL HIGH (ref 6–20)
CO2: 26 mmol/L (ref 22–32)
Calcium: 9.7 mg/dL (ref 8.9–10.3)
Chloride: 93 mmol/L — ABNORMAL LOW (ref 98–111)
Creatinine, Ser: 1.33 mg/dL — ABNORMAL HIGH (ref 0.61–1.24)
GFR, Estimated: >60 mL/min (ref >60)
Glucose, Bld: 129 mg/dL — ABNORMAL HIGH (ref 70–99)
Potassium: 4.1 mmol/L (ref 3.5–5.1)
Sodium: 129 mmol/L — ABNORMAL LOW (ref 135–145)
Total Bilirubin: 1.2 mg/dL (ref 0.3–1.2)
Total Protein: 7.9 g/dL (ref 6.5–8.1)

## 2021-06-11 LAB — RAD ONC ARIA SESSION SUMMARY
Course Elapsed Days: 13
Plan Fractions Treated to Date: 10
Plan Prescribed Dose Per Fraction: 3 Gy
Plan Total Fractions Prescribed: 10
Plan Total Prescribed Dose: 30 Gy
Reference Point Dosage Given to Date: 30 Gy
Reference Point Session Dosage Given: 3 Gy
Session Number: 10

## 2021-06-11 MED ORDER — KETOROLAC TROMETHAMINE 15 MG/ML IJ SOLN
30.0000 mg | Freq: Once | INTRAMUSCULAR | Status: AC
  Administered 2021-06-11: 30 mg via INTRAVENOUS
  Filled 2021-06-11: qty 2

## 2021-06-11 MED ORDER — SODIUM CHLORIDE 0.9 % IV SOLN: 8.0000 mg | Freq: Once | INTRAVENOUS | Status: DC

## 2021-06-11 MED ORDER — ONDANSETRON HCL 4 MG/2ML IJ SOLN: 8.0000 mg | Freq: Once | INTRAMUSCULAR | Status: DC

## 2021-06-11 MED ORDER — SODIUM CHLORIDE 0.9% FLUSH
10.0000 mL | Freq: Once | INTRAVENOUS | Status: AC
  Administered 2021-06-11: 10 mL via INTRAVENOUS

## 2021-06-11 MED ORDER — ONDANSETRON HCL 4 MG/2ML IJ SOLN
8.0000 mg | Freq: Once | INTRAMUSCULAR | Status: AC
  Administered 2021-06-11: 8 mg via INTRAVENOUS
  Filled 2021-06-11: qty 4

## 2021-06-11 MED ORDER — HEPARIN SOD (PORK) LOCK FLUSH 100 UNIT/ML IV SOLN
500.0000 [IU] | Freq: Once | INTRAVENOUS | Status: AC
  Administered 2021-06-11: 500 [IU] via INTRAVENOUS

## 2021-06-11 MED ORDER — HYDROMORPHONE HCL 4 MG/ML IJ SOLN
4.0000 mg | Freq: Once | INTRAMUSCULAR | Status: AC
  Administered 2021-06-11: 4 mg via INTRAVENOUS
  Filled 2021-06-11: qty 1

## 2021-06-11 MED ORDER — SODIUM CHLORIDE 0.9 % IV SOLN: Freq: Once | INTRAVENOUS | Status: AC

## 2021-06-11 MED ORDER — SODIUM CHLORIDE 0.9 % IV SOLN
8.0000 mg | Freq: Once | INTRAVENOUS | Status: DC
  Administered 2021-06-11: 8 mg via INTRAVENOUS

## 2021-06-11 NOTE — Patient Instructions (Signed)
[facility] [place]  Discharge Instructions: ?Thank you for choosing [facility] to provide your oncology and hematology care.  ? ?If you have a lab appointment with the [facility], please go directly to the [facility] and check in at the registration area. ? ?Wear comfortable clothing and clothing appropriate for easy access to any Portacath or PICC line.  ? ?We strive to give you quality time with your provider. You may need to reschedule your appointment if you arrive late (15 or more minutes).  Arriving late affects you and other patients whose appointments are after yours.  Also, if you miss three or more appointments without notifying the office, you may be dismissed from the clinic at the provider?s discretion.    ?  ?For prescription refill requests, have your pharmacy contact our office and allow 72 hours for refills to be completed.   ? ?Today you received the following chemotherapy and/or immunotherapy agents IVF, Zofran, Dilaudid and Toradol. ?  ?To help prevent nausea and vomiting after your treatment, we encourage you to take your nausea medication as directed. ? ?BELOW ARE SYMPTOMS THAT SHOULD BE REPORTED IMMEDIATELY: ?*FEVER GREATER THAN 100.4 F (38 ?C) OR HIGHER ?*CHILLS OR SWEATING ?*NAUSEA AND VOMITING THAT IS NOT CONTROLLED WITH YOUR NAUSEA MEDICATION ?*UNUSUAL SHORTNESS OF BREATH ?*UNUSUAL BRUISING OR BLEEDING ?*URINARY PROBLEMS (pain or burning when urinating, or frequent urination) ?*BOWEL PROBLEMS (unusual diarrhea, constipation, pain near the anus) ?TENDERNESS IN MOUTH AND THROAT WITH OR WITHOUT PRESENCE OF ULCERS (sore throat, sores in mouth, or a toothache) ?UNUSUAL RASH, SWELLING OR PAIN  ?UNUSUAL VAGINAL DISCHARGE OR ITCHING  ? ?Items with * indicate a potential emergency and should be followed up as soon as possible or go to the Emergency Department if any problems should occur. ? ?Please show the CHEMOTHERAPY ALERT CARD or IMMUNOTHERAPY ALERT  CARD at check-in to the Emergency Department and triage nurse. ?Should you have questions after your visit or need to cancel or reschedule your appointment, please contact [facility]  [phone] and follow the prompts.  Office hours are 8:00 a.m. to 4:30 p.m. Monday - Friday. Please note that voicemails left after 4:00 p.m. may not be returned until the following business day.  We are closed weekends and major holidays. You have access to a nurse at all times for urgent questions. Please call the main number to the clinic [phone] and follow the prompts. ? ?For any non-urgent questions, you may also contact your provider using MyChart. We now offer e-Visits for anyone 6 and older to request care online for non-urgent symptoms. For details visit [email or web address]. ?  ?Also download the MyChart app! Go to the app store, search "MyChart", open the app, select [place], and log in with your MyChart username and password. ? ?Due to Covid, a mask is required upon entering the hospital/clinic. If you do not have a mask, one will be given to you upon arrival. For doctor visits, patients may have 1 support person aged 33 or older with them. For treatment visits, patients cannot have anyone with them due to current Covid guidelines and our immunocompromised population.  ?

## 2021-06-11 NOTE — Patient Instructions (Signed)

## 2021-06-11 NOTE — Telephone Encounter (Signed)
Received a call from patient stating that he has been having pain in his right side when he takes a deep breath.   Patient is on the way to Radiation for visit with physician and RT.  Dr Marin Olp notified of above.  Wants patient to let RT know about his complaints for an assessment.  Patient understands and appreciates call ?

## 2021-06-12 ENCOUNTER — Encounter: Payer: Self-pay | Admitting: Radiology

## 2021-06-16 ENCOUNTER — Other Ambulatory Visit: Payer: Self-pay | Admitting: *Deleted

## 2021-06-16 ENCOUNTER — Encounter: Payer: Self-pay | Admitting: Hematology & Oncology

## 2021-06-16 ENCOUNTER — Telehealth: Payer: Self-pay | Admitting: *Deleted

## 2021-06-16 DIAGNOSIS — R21 Rash and other nonspecific skin eruption: Secondary | ICD-10-CM

## 2021-06-16 MED ORDER — METHYLPREDNISOLONE 4 MG PO TBPK: ORAL_TABLET | Freq: Every day | ORAL | 0 refills | Status: DC

## 2021-06-16 NOTE — Progress Notes (Signed)
?Radiation Oncology         [phone] ?________________________________ ? ?Name: Brian [name] MRN: [id number]  ?Date: 06/17/2021  DOB: [birth year] ? ?Follow-Up Visit Note ? ?CC: Sharyne Richters, MD  [name], MD ? ?  ICD-10-CM   ?1. Primary cancer of thyroid with metastasis to other site [facility])  C73   ?  ?2. Malignant neoplasm metastatic to liver [facility])  C78.7   ?  ?3. Adenocarcinoma ([place])  C80.1   ?  ? ? ?Diagnosis:  The primary encounter diagnosis was Primary cancer of thyroid with metastasis to other site [facility]). A diagnosis of Malignant neoplasm metastatic to liver [facility]) was also pertinent to this visit. ?  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 ? Cancer Staging  ?Primary cancer of thyroid with metastasis to other site [facility]) ?Staging form: Thyroid - Anaplastic, AJCC 8th Edition ?- Clinical stage from 04/12/2020: Stage IVC (rcTX, cN1b, pM1) - Signed by [name], MD on 04/12/2020 ? ?Interval Since Last Radiation: 6 days  ? ?4) ?Intent: Palliative ?Radiation Treatment Dates: 05/29/2021 through 06/11/2021 ?Site Technique Total Dose (Gy) Dose per Fx (Gy) Completed Fx Beam Energies  ?Scapula, Left: Chest_L 3D 30/30 3 10/10 10X  ? ?3) ?Diagnosis: Progressive metastatic thyroid cancer with painful disease in the thoracic spine and left hip/pelvis.    ?Indication for treatment:  Palliation      ?Radiation treatment dates:   02/24/21-03/07/21 ?Site/dose:    ?T4-T6  30 Gy in 10 fractions  ?left hip, including re-treatment of the left iliac lesion 30 Gy in 10 fractions  ?Beams/energy:    ?3D conformal with ant, post and DCA ?3D conformal with ant, post, post RF and post oblique ?  ?2) ?Diagnosis: Progressive metastatic thyroid cancer involving the liver, portacaval lymph node and a new lesion in the left hip/ilium.    ?Indication for treatment: Palliation      ?Radiation treatment dates:   07/16/20 - 07/29/20 ?Site/dose:   The target in the left hip/ilium was treated  to 30 Gy in 10 fractions. ?Beams/energy:   A 3D field set-up was employed with 6 MV X-rays ?  ?1) ?Diagnosis:  Progressive metastatic thyroid cancer involving the liver and a portacaval lymph node.    ?Indication for treatment:  Palliative      ?Radiation treatment dates:   04/15/20 - 04/26/20 ?Site/dose:   The liver nodule and portacaval lymph node were treated to a prescription dose of 30 Gy in 10 fractions of 3 Gy each. ?Beams/energy:   A 3D field set-up was employed with 6 MV X-rays ? ?Narrative:  The patient returns today for routine follow-up. The patient had a difficult time tolerating radiation therapy. During his final weekly treatment check on 06/10/21, the patient reported an increase in fatigue, pain, and weakness, severe stabbing to the right shoulder, right abdominal pain, and dyspnea on exertion and at rest. During that visit, I also reviewed recent PET imaging with the patient which showed significant liver disease,, and we discussed the role of palliative liver RT. Overall, his right shoulder and liver areas are causing him the most pain, and he was advised to let Dr. Marin Olp know if his pain is not adequately controlled. ? ?The patient also started oral chemotherapy consisting of Nexavar around a week before his final RT. Yesterday (06/16/21), the patient called in to Dr. Antonieta Pert office with reports of a new rash on both of his feet, legs, arms,  and upper chest. Dr. Marin Olp instructed the patient to stop Nexavar and prescribed a Medrol Dosepak.  ? ?Pertinent imaging thus far includes an x-ray of the right shoulder on 06/02/21 which showed minimal inferior glenohumeral joint space narrowing and peripheral osteophytosis.            ? ?Patient is seen earlier than his scheduled follow-up appointment.  During his last week of treatment he was not feeling well at all.  Patient had developed a significant rash in his Nexavar was discontinued as above.  He is started a Medrol Dosepak and feels much  better.  For his last few treatments he required a wheelchair coming in but was able to walk without a wheelchair today. ? ?He reports his pain in the left shoulder area is much better after receiving palliative radiation therapy to this area most recently.  He denies any pain in his right shoulder area at this time or abdominal region.  He is accompanied by his father on evaluation today.                    ? ?Allergies:  is allergic to dextromethorphan-guaifenesin, doxycycline, gadobutrol, gadolinium derivatives, guaifenesin, ibuprofen, lisinopril, pantoprazole, tramadol, barium, and chlorhexidine. ? ?Meds: ?Current Outpatient Medications  ?Medication Sig Dispense Refill  ? apixaban (ELIQUIS) 5 MG TABS tablet TAKE 1 TABLET (5 MG TOTAL) BY MOUTH 2 (TWO) TIMES DAILY. 60 tablet 6  ? cyclobenzaprine (FLEXERIL) 10 MG tablet Take 1 tablet (10 mg total) by mouth 3 (three) times daily as needed. 90 tablet 1  ? diphenhydrAMINE (BENADRYL) 25 mg capsule TAKE 1 CAPSULE BY MOUTH 1 HOUR BEFORE CT SCAN 30 capsule 0  ? EPINEPHrine (EPIPEN 2-PAK) 0.3 mg/0.3 mL IJ SOAJ injection USE AS DIRECTED FOR LIFE THREATENING ALLERGIC REACTIONS 4 each 3  ? famotidine (PEPCID) 40 MG tablet Take 1 tablet (40 mg total) by mouth 2 (two) times daily. 60 tablet 4  ? HYDROmorphone (DILAUDID) 8 MG tablet Take 1 tablet (8 mg total) by mouth every 4 (four) hours as needed for severe pain. 120 tablet 0  ? Iron-FA-B Cmp-C-Biot-Probiotic (FUSION PLUS) CAPS Take 1 capsule by mouth daily. 30 capsule 6  ? levothyroxine (SYNTHROID) 200 MCG tablet Take one tablet (200 mcg dose) by mouth daily. Total:  288 mcg/day. 90 tablet 3  ? LORazepam (ATIVAN) 1 MG tablet Take 1 tablet (1 mg total) by mouth every 6 (six) hours as needed for anxiety. 30 tablet 0  ? methylphenidate (RITALIN) 10 MG tablet Take 1 tablet (10 mg total) by mouth 2 (two) times daily. 60 tablet 0  ? methylPREDNISolone (MEDROL DOSEPAK) 4 MG TBPK tablet Take by mouth daily. Take as directed. 21  tablet 0  ? metoCLOPramide (REGLAN) 10 MG tablet Take 1 tablet (10 mg total) by mouth every 6 (six) hours as needed for nausea or vomiting. 30 tablet 6  ? metoprolol tartrate (LOPRESSOR) 50 MG tablet Take 1 tablet (50 mg total) by mouth 2 (two) times daily. 60 tablet 4  ? Multiple Vitamin (MULTIVITAMIN) capsule Take 1 capsule by mouth daily.    ? OLANZapine (ZYPREXA) 10 MG tablet TAKE 1 TABLET (10 MG TOTAL) BY MOUTH AT BEDTIME. 30 tablet 4  ? oxyCODONE ER (XTAMPZA ER) 36 MG C12A Take 1 capsule (36 mg total) by mouth every 12 (twelve) hours 60 capsule 0  ? polyethylene glycol (MIRALAX / GLYCOLAX) 17 g packet Take 17 g by mouth daily as needed for mild constipation. 14 each 0  ?  potassium chloride SA (KLOR-CON M) 20 MEQ tablet Take 1 tablet (20 mEq total) by mouth daily. 30 tablet 5  ? prochlorperazine (COMPAZINE) 10 MG tablet Take 1 tablet (10 mg total) by mouth every 6 (six) hours as needed (Nausea or vomiting). 30 tablet 3  ? promethazine (PHENERGAN) 25 MG tablet TAKE 1 TABLET BY MOUTH EVERY 6 HOURS AS NEEDED FOR NAUSEA & VOMITING 120 tablet 0  ? temazepam (RESTORIL) 30 MG capsule Take 1 capsule (30 mg total) by mouth at bedtime as needed for sleep. 30 capsule 0  ? predniSONE (DELTASONE) 50 MG tablet TAKE 1 TABLET BY MOUTH 13 HOURS BEFORE CT SCAN, 1 TABLET 7 HOURS BEFORE CT SCAN, AND 1 TABLET 1 HOUR BEFORE CT SCAN (Patient not taking: Reported on 05/08/2021) 3 tablet 0  ? senna-docusate (SENOKOT-S) 8.6-50 MG tablet Take 1 tablet by mouth at bedtime as needed for mild constipation. (Patient not taking: Reported on 06/17/2021) 30 tablet 0  ? SORAfenib (NEXAVAR) 200 MG tablet Take 2 tablets (400 mg total) by mouth 2 (two) times daily. Give on an empty stomach 1 hour before or 2 hours after meals. (Patient not taking: Reported on 06/17/2021) 120 tablet 3  ? ?No current facility-administered medications for this encounter.  ? ?Facility-Administered Medications Ordered in Other Encounters  ?Medication Dose Route Frequency  Provider Last Rate Last Admin  ? HYDROmorphone (DILAUDID) injection 4 mg  4 mg Intravenous Once [name], MD      ? sodium chloride flush (NS) 0.9 % injection 10 mL  10 mL Intravenous PRN [name],
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
## 2021-06-16 NOTE — Telephone Encounter (Signed)
Patient sent a MyChart message showing images of a rash on both feet, legs, arms and upper chest and back. It itches really bad. I informed the patient through MyChart that Dr. Arelia Sneddon wants him to stop taking Naxavar. He is going to prescribe a Medrol dosepak. He verbalzied understanding. ?

## 2021-06-17 ENCOUNTER — Encounter: Payer: Self-pay | Admitting: Radiation Oncology

## 2021-06-17 ENCOUNTER — Other Ambulatory Visit: Payer: Self-pay

## 2021-06-17 ENCOUNTER — Ambulatory Visit
Admission: RE | Admit: 2021-06-17 | Discharge: 2021-06-17 | Disposition: A | Discharge status: Home / Self Care | Payer: BC Managed Care – PPO | Source: Ambulatory Visit | Attending: Radiation Oncology | Admitting: Radiation Oncology

## 2021-06-17 VITALS — BP 107/81 | HR 90 | Temp 97.8°F | Resp 20 | Ht 70.0 in | Wt 246.0 lb

## 2021-06-17 DIAGNOSIS — C7951 Secondary malignant neoplasm of bone: Secondary | ICD-10-CM | POA: Diagnosis not present

## 2021-06-17 DIAGNOSIS — C73 Malignant neoplasm of thyroid gland: Secondary | ICD-10-CM | POA: Insufficient documentation

## 2021-06-17 DIAGNOSIS — Z923 Personal history of irradiation: Secondary | ICD-10-CM | POA: Insufficient documentation

## 2021-06-17 DIAGNOSIS — Z7901 Long term (current) use of anticoagulants: Secondary | ICD-10-CM | POA: Insufficient documentation

## 2021-06-17 DIAGNOSIS — C779 Secondary and unspecified malignant neoplasm of lymph node, unspecified: Secondary | ICD-10-CM | POA: Insufficient documentation

## 2021-06-17 DIAGNOSIS — Z7952 Long term (current) use of systemic steroids: Secondary | ICD-10-CM | POA: Diagnosis not present

## 2021-06-17 DIAGNOSIS — C801 Malignant (primary) neoplasm, unspecified: Secondary | ICD-10-CM

## 2021-06-17 DIAGNOSIS — Z79899 Other long term (current) drug therapy: Secondary | ICD-10-CM | POA: Insufficient documentation

## 2021-06-17 DIAGNOSIS — C787 Secondary malignant neoplasm of liver and intrahepatic bile duct: Secondary | ICD-10-CM | POA: Insufficient documentation

## 2021-06-17 NOTE — Progress Notes (Signed)
Brian [name] is here today for follow up post radiation to the left chest and scapula completed on 06/11/2021. ? ?Does the patient complain of any of the following: ?Pain:denies ?Shortness of breath w/wo exertion: denies ?Cough: denies ?Hemoptysis: denies ?Pain with swallowing: denies ?Swallowing/choking concerns: denies ?Appetite: poor ?Energy Level: poor, severe fatigue ?Post radiation skin Changes: redness has resolved ? ? ? ?Additional comments if applicable: generalized itching and burning rash. Using Benadryl and Medrol dose pack without relief. Voice is diminished. States this sometimes happens with oral chemo. ? ?Vitals:  ? 06/17/21 1302  ?BP: 107/81  ?Pulse: 90  ?Resp: 20  ?Temp: 97.8 ?F (36.6 ?C)  ?SpO2: 98%  ?Weight: 246 lb (111.6 kg)  ?Height: 5\' 10"  (1.778 m)  ? ? ?

## 2021-06-19 ENCOUNTER — Other Ambulatory Visit (HOSPITAL_COMMUNITY): Payer: Self-pay

## 2021-06-25 ENCOUNTER — Other Ambulatory Visit: Payer: Self-pay | Admitting: Hematology & Oncology

## 2021-06-25 ENCOUNTER — Other Ambulatory Visit (HOSPITAL_BASED_OUTPATIENT_CLINIC_OR_DEPARTMENT_OTHER): Payer: Self-pay

## 2021-06-25 DIAGNOSIS — C73 Malignant neoplasm of thyroid gland: Secondary | ICD-10-CM

## 2021-06-25 MED ORDER — TEMAZEPAM 30 MG PO CAPS
30.0000 mg | ORAL_CAPSULE | Freq: Every evening | ORAL | 0 refills | Status: DC | PRN
  Filled 2021-06-25 – 2021-07-07 (×5): qty 30, 30d supply, fill #0

## 2021-06-26 ENCOUNTER — Other Ambulatory Visit (HOSPITAL_BASED_OUTPATIENT_CLINIC_OR_DEPARTMENT_OTHER): Payer: Self-pay

## 2021-06-30 ENCOUNTER — Other Ambulatory Visit: Payer: Self-pay | Admitting: *Deleted

## 2021-06-30 DIAGNOSIS — C799 Secondary malignant neoplasm of unspecified site: Secondary | ICD-10-CM

## 2021-07-01 ENCOUNTER — Inpatient Hospital Stay: Payer: BC Managed Care – PPO

## 2021-07-01 ENCOUNTER — Inpatient Hospital Stay: Payer: BC Managed Care – PPO | Admitting: Hematology & Oncology

## 2021-07-01 ENCOUNTER — Inpatient Hospital Stay: Payer: BC Managed Care – PPO | Attending: Hematology & Oncology

## 2021-07-01 ENCOUNTER — Other Ambulatory Visit: Payer: Self-pay | Admitting: Hematology & Oncology

## 2021-07-01 ENCOUNTER — Encounter: Payer: Self-pay | Admitting: Hematology & Oncology

## 2021-07-01 ENCOUNTER — Other Ambulatory Visit (HOSPITAL_BASED_OUTPATIENT_CLINIC_OR_DEPARTMENT_OTHER): Payer: Self-pay

## 2021-07-01 VITALS — BP 118/74 | HR 94 | Temp 98.1°F | Resp 17 | Wt 245.0 lb

## 2021-07-01 VITALS — BP 121/71 | HR 89 | Temp 98.1°F | Resp 18

## 2021-07-01 DIAGNOSIS — C73 Malignant neoplasm of thyroid gland: Secondary | ICD-10-CM

## 2021-07-01 DIAGNOSIS — E038 Other specified hypothyroidism: Secondary | ICD-10-CM

## 2021-07-01 DIAGNOSIS — Z79899 Other long term (current) drug therapy: Secondary | ICD-10-CM | POA: Diagnosis not present

## 2021-07-01 DIAGNOSIS — M25511 Pain in right shoulder: Secondary | ICD-10-CM | POA: Diagnosis not present

## 2021-07-01 DIAGNOSIS — C787 Secondary malignant neoplasm of liver and intrahepatic bile duct: Secondary | ICD-10-CM | POA: Diagnosis present

## 2021-07-01 DIAGNOSIS — C7951 Secondary malignant neoplasm of bone: Secondary | ICD-10-CM | POA: Insufficient documentation

## 2021-07-01 DIAGNOSIS — E860 Dehydration: Secondary | ICD-10-CM

## 2021-07-01 DIAGNOSIS — Z5111 Encounter for antineoplastic chemotherapy: Secondary | ICD-10-CM | POA: Diagnosis present

## 2021-07-01 DIAGNOSIS — I824Z2 Acute embolism and thrombosis of unspecified deep veins of left distal lower extremity: Secondary | ICD-10-CM

## 2021-07-01 DIAGNOSIS — Z95828 Presence of other vascular implants and grafts: Secondary | ICD-10-CM

## 2021-07-01 DIAGNOSIS — M546 Pain in thoracic spine: Secondary | ICD-10-CM

## 2021-07-01 DIAGNOSIS — G894 Chronic pain syndrome: Secondary | ICD-10-CM

## 2021-07-01 DIAGNOSIS — R110 Nausea: Secondary | ICD-10-CM

## 2021-07-01 DIAGNOSIS — E291 Testicular hypofunction: Secondary | ICD-10-CM

## 2021-07-01 DIAGNOSIS — D509 Iron deficiency anemia, unspecified: Secondary | ICD-10-CM

## 2021-07-01 LAB — CMP (CANCER CENTER ONLY)
ALT: 16 U/L (ref 0–44)
AST: 27 U/L (ref 15–41)
Albumin: 3.5 g/dL (ref 3.5–5.0)
Alkaline Phosphatase: 263 U/L — ABNORMAL HIGH (ref 38–126)
Anion gap: 7 (ref 5–15)
BUN: 14 mg/dL (ref 6–20)
CO2: 28 mmol/L (ref 22–32)
Calcium: 9.2 mg/dL (ref 8.9–10.3)
Chloride: 101 mmol/L (ref 98–111)
Creatinine: 0.89 mg/dL (ref 0.61–1.24)
GFR, Estimated: >60 mL/min (ref >60)
Glucose, Bld: 100 mg/dL — ABNORMAL HIGH (ref 70–99)
Potassium: 4.3 mmol/L (ref 3.5–5.1)
Sodium: 136 mmol/L (ref 135–145)
Total Bilirubin: 0.5 mg/dL (ref 0.3–1.2)
Total Protein: 6.5 g/dL (ref 6.5–8.1)

## 2021-07-01 LAB — CBC WITH DIFFERENTIAL (CANCER CENTER ONLY)
Abs Immature Granulocytes: 0.01 10*3/uL (ref 0.00–0.07)
Basophils Absolute: 0 10*3/uL (ref 0.0–0.1)
Basophils Relative: 1 %
Eosinophils Absolute: 0.1 10*3/uL (ref 0.0–0.5)
Eosinophils Relative: 2 %
HCT: 34.6 % — ABNORMAL LOW (ref 39.0–52.0)
Hemoglobin: 10.6 g/dL — ABNORMAL LOW (ref 13.0–17.0)
Immature Granulocytes: 0 %
Lymphocytes Relative: 6 %
Lymphs Abs: 0.3 10*3/uL — ABNORMAL LOW (ref 0.7–4.0)
MCH: 27.1 pg (ref 26.0–34.0)
MCHC: 30.6 g/dL (ref 30.0–36.0)
MCV: 88.5 fL (ref 80.0–100.0)
Monocytes Absolute: 0.5 10*3/uL (ref 0.1–1.0)
Monocytes Relative: 13 %
Neutro Abs: 3.2 10*3/uL (ref 1.7–7.7)
Neutrophils Relative %: 78 %
Platelet Count: 284 10*3/uL (ref 150–400)
RBC: 3.91 MIL/uL — ABNORMAL LOW (ref 4.22–5.81)
RDW: 16.5 % — ABNORMAL HIGH (ref 11.5–15.5)
WBC Count: 4.1 10*3/uL (ref 4.0–10.5)
nRBC: 0 % (ref 0.0–0.2)

## 2021-07-01 MED ORDER — TESTOSTERONE CYPIONATE 200 MG/ML IM SOLN
400.0000 mg | INTRAMUSCULAR | Status: DC
  Administered 2021-07-01: 400 mg via INTRAMUSCULAR
  Filled 2021-07-01: qty 2

## 2021-07-01 MED ORDER — SODIUM CHLORIDE 0.9% FLUSH
10.0000 mL | Freq: Once | INTRAVENOUS | Status: AC
  Administered 2021-07-01: 10 mL via INTRAVENOUS

## 2021-07-01 MED ORDER — SODIUM CHLORIDE 0.9 % IV SOLN: INTRAVENOUS | Status: AC

## 2021-07-01 MED ORDER — HYDROMORPHONE HCL 8 MG PO TABS
8.0000 mg | ORAL_TABLET | ORAL | 0 refills | Status: DC | PRN
  Filled 2021-07-01: qty 120, 20d supply, fill #0

## 2021-07-01 MED ORDER — HYDROMORPHONE HCL 4 MG/ML IJ SOLN: 4.0000 mg | Freq: Once | INTRAMUSCULAR | Status: DC

## 2021-07-01 MED ORDER — HYDROMORPHONE HCL 1 MG/ML IJ SOLN
4.0000 mg | Freq: Once | INTRAMUSCULAR | Status: AC
  Administered 2021-07-01: 4 mg via INTRAVENOUS
  Filled 2021-07-01: qty 4

## 2021-07-01 MED ORDER — SODIUM CHLORIDE 0.9 % IV SOLN: Freq: Once | INTRAVENOUS | Status: DC

## 2021-07-01 MED ORDER — SODIUM CHLORIDE 0.9 % IV SOLN: Freq: Once | INTRAVENOUS | Status: AC

## 2021-07-01 MED ORDER — ZOLEDRONIC ACID 4 MG/100ML IV SOLN
4.0000 mg | Freq: Once | INTRAVENOUS | Status: AC
  Administered 2021-07-01: 4 mg via INTRAVENOUS
  Filled 2021-07-01: qty 100

## 2021-07-01 MED ORDER — HEPARIN SOD (PORK) LOCK FLUSH 100 UNIT/ML IV SOLN
500.0000 [IU] | Freq: Once | INTRAVENOUS | Status: AC
  Administered 2021-07-01: 500 [IU] via INTRAVENOUS

## 2021-07-01 NOTE — Patient Instructions (Signed)

## 2021-07-01 NOTE — Progress Notes (Signed)
Patient asks for pain medication. Verbal order from doctor Ennever for Dilaudid 4 mg. ?

## 2021-07-01 NOTE — Progress Notes (Signed)
?Hematology and Oncology Follow Up Visit ? ?Brian [name] ?[id number] ?02/19/1975 46 y.o. ?07/01/2021 ? ? 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  ?Past Therapy: ?Cabometyx 40 mg po q day -- start on 04/17/2019 - d/c on 07/17/2019 for progression ?Lenvima 24 mg po q day -- on hold due to proteinuria ?Taxotere/CDDP -- s/p cycle 4 - started on 09/13/2019 ?  ?Current Therapy:     ? ?Vinblastine q week x 4 weeks on / 1 week off -- s/p  cycle #2 -- start on 02/24/2021 -- d/c on 05/20/2021 ?Gavreto 400 mg po q day -- started on 05/17/2020  -- d/c on 06/16/2020 ?Adriamycin weekly 2 weeks on 1 week off - s/p cycle 8 --d/c on 04/16/2020 due to progression ?XRT for LEFT shoulder met -completed on 06/22/2021 ?Pembrolizumab 200 mg IV q 3 week -- s/p cycle #8 - start on 06/26/2020 -- d/c on 01/16/2021 due to progression ?Zometa 4 mg IV q 2 months - next dose on 08/2021 ?Eliquis 5 mg po BID ?FOLFOX -- start cycle #1 on 07/08/2021 ?  ?Interim History:  Brian [name] is here today for follow-up.  Unfortunately, he had a reaction to the Nexavar.  We had him on Nexavar.  A week after he started the next far, had a bad rash.  He subsequently had to stop this. ? ?He did have radiation therapy.  This was for his left shoulder.  This did seem to help a little bit. ? ?There are some rare articles regarding the use of FOLFOX with metastatic thyroid cancer.  I think this would be reasonable to try.  He still has a decent performance status says that he would be able to handle treatment. ? ?Again, I realize that there is no standard protocols for metastatic Hurthle cell cancer.  You will not find anything in NCCN.  There will be nothing in any textbook. ? ?I do think that the use of FOLFOX would not be a bad idea.  Again I think he could tolerate this. ? ?I talked him about this.  He is willing to give this a  try.  He already has a Port-A-Cath in place so I think this would work. ? ?These blood counts can certainly tolerate the protocol. ? ?He does have pain.  However the radiation does seem to help his left shoulder. ? ?He has had no problems going to the bathroom.  He has had no issues with nausea or vomiting.  He has had no cough or shortness of breath. ? ?He has lost a little bit of weight.  I suppose I should not be surprised by this. ? ?When he sneezes, he says he has pain in the right shoulder and back.  I suspect this probably is from his liver met. ? ?The supplemental testosterone that we given is making him feel better.  He has little more energy and more stamina.  I am happy about this. ? ?Currently, I would say his performance status is probably ECOG 1.    ? ? ?Medications:  ?Allergies as of 07/01/2021   ? ?   Reactions  ? Dextromethorphan-guaifenesin Other (See Comments)  ? Irregular heartbeat  ? Doxycycline Anaphylaxis, Nausea And Vomiting, Rash, Other (See Comments)  ? "heart arrythmia" and "dyspepsia" ?(only oral doxycycline causes reaction)  ? Gadobutrol Hives, Other (See Comments)  ? Patient  had MRI scan at [place]. Patient called one hour after he left imaging facility to report two "blisters" that came up on "back" of lip.   ? Gadolinium Derivatives Hives  ? Patient had MRI scan at [place]. Patient called one hour after he left imaging facility to report two "blisters" that came up on "back" of lip.   ? Guaifenesin Palpitations  ?   ? Ibuprofen Hives, Itching  ? Lisinopril Other (See Comments)  ? Angioedema  ? Pantoprazole Itching  ? Tramadol Hives, Itching  ? Barium Rash  ? Developed redness around neck after drinking 1st bottle of Barocat; pt was given Benedryl by ED Mds to be "on the safe side" before drinking 2nd bottle  ? Chlorhexidine Hives  ? ?  ? ?  ?Medication List  ?  ? ?  ? Accurate as of Jul 01, 2021  8:47 AM. If you have any questions, ask your nurse or doctor.  ?  ?   ? ?  ? ?STOP taking these medications   ? ?methylPREDNISolone 4 MG Tbpk tablet ?Commonly known as: MEDROL DOSEPAK ?Stopped by: [name], MD ?  ?SORAfenib 200 MG tablet ?Commonly known as: NEXAVAR ?Stopped by: [name], MD ?  ? ?  ? ?TAKE these medications   ? ?cyclobenzaprine 10 MG tablet ?Commonly known as: FLEXERIL ?Take 1 tablet (10 mg total) by mouth 3 (three) times daily as needed. ?  ?diphenhydrAMINE 25 mg capsule ?Commonly known as: BENADRYL ?TAKE 1 CAPSULE BY MOUTH 1 HOUR BEFORE CT SCAN ?  ?Eliquis 5 MG Tabs tablet ?Generic drug: apixaban ?TAKE 1 TABLET (5 MG TOTAL) BY MOUTH 2 (TWO) TIMES DAILY. ?  ?EPINEPHrine 0.3 mg/0.3 mL Soaj injection ?Commonly known as: EpiPen 2-Pak ?USE AS DIRECTED FOR LIFE THREATENING ALLERGIC REACTIONS ?  ?famotidine 40 MG tablet ?Commonly known as: PEPCID ?Take 1 tablet (40 mg total) by mouth 2 (two) times daily. ?  ?Fusion Plus Caps ?Take 1 capsule by mouth daily. ?  ?HYDROmorphone 8 MG tablet ?Commonly known as: DILAUDID ?Take 1 tablet (8 mg total) by mouth every 4 (four) hours as needed for severe pain. ?  ?levothyroxine 200 MCG tablet ?Commonly known as: SYNTHROID ?Take one tablet (200 mcg dose) by mouth daily. Total:  288 mcg/day. ?  ?LORazepam 1 MG tablet ?Commonly known as: ATIVAN ?Take 1 tablet (1 mg total) by mouth every 6 (six) hours as needed for anxiety. ?  ?methylphenidate 10 MG tablet ?Commonly known as: RITALIN ?Take 1 tablet (10 mg total) by mouth 2 (two) times daily. ?  ?metoCLOPramide 10 MG tablet ?Commonly known as: REGLAN ?Take 1 tablet (10 mg total) by mouth every 6 (six) hours as needed for nausea or vomiting. ?  ?metoprolol tartrate 50 MG tablet ?Commonly known as: Lopressor ?Take 1 tablet (50 mg total) by mouth 2 (two) times daily. ?  ?multivitamin capsule ?Take 1 capsule by mouth daily. ?  ?OLANZapine 10 MG tablet ?Commonly known as: ZYPREXA ?TAKE 1 TABLET (10 MG TOTAL) BY MOUTH AT BEDTIME. ?  ?polyethylene glycol 17 g packet ?Commonly known  as: MIRALAX / GLYCOLAX ?Take 17 g by mouth daily as needed for mild constipation. ?  ?potassium chloride SA 20 MEQ tablet ?Commonly known as: KLOR-CON M ?Take 1 tablet (20 mEq total) by mouth daily. ?  ?predniSONE 50 MG tablet ?Commonly known as: DELTASONE ?TAKE 1 TABLET BY MOUTH 13 HOURS BEFORE CT SCAN, 1 TABLET 7 HOURS BEFORE CT SCAN, AND 1 TABLET 1  HOUR BEFORE CT SCAN ?  ?prochlorperazine 10 MG tablet ?Commonly known as: COMPAZINE ?Take 1 tablet (10 mg total) by mouth every 6 (six) hours as needed (Nausea or vomiting). ?  ?promethazine 25 MG tablet ?Commonly known as: PHENERGAN ?TAKE 1 TABLET BY MOUTH EVERY 6 HOURS AS NEEDED FOR NAUSEA & VOMITING ?  ?senna-docusate 8.6-50 MG tablet ?Commonly known as: Senokot-S ?Take 1 tablet by mouth at bedtime as needed for mild constipation. ?  ?temazepam 30 MG capsule ?Commonly known as: RESTORIL ?Take 1 capsule (30 mg total) by mouth at bedtime as needed for sleep. ?  ?Xtampza ER 36 MG C12a ?Generic drug: oxyCODONE ER ?Take 1 capsule (36 mg total) by mouth every 12 (twelve) hours ?  ? ?  ? ? ?Allergies:  ?Allergies  ?Allergen Reactions  ? Dextromethorphan-Guaifenesin Other (See Comments)  ?  Irregular heartbeat ?  ? Doxycycline Anaphylaxis, Nausea And Vomiting, Rash and Other (See Comments)  ?  "heart arrythmia" and "dyspepsia" ?(only oral doxycycline causes reaction)  ? Gadobutrol Hives and Other (See Comments)  ?  Patient had MRI scan at [place]. Patient called one hour after he left imaging facility to report two "blisters" that came up on "back" of lip.  ?  ? Gadolinium Derivatives Hives  ?  Patient had MRI scan at [place]. Patient called one hour after he left imaging facility to report two "blisters" that came up on "back" of lip.   ? Guaifenesin Palpitations  ?   ?  ? Ibuprofen Hives and Itching  ? Lisinopril Other (See Comments)  ?  Angioedema  ? Pantoprazole Itching  ? Tramadol Hives and Itching  ? Barium Rash  ?  Developed redness around neck  after drinking 1st bottle of Barocat; pt was given Benedryl by ED Mds to be "on the safe side" before drinking 2nd bottle  ? Chlorhexidine Hives  ? ? ?Past Medical History, Surgical history, Social histor

## 2021-07-02 LAB — TESTOSTERONE: Testosterone: 191 ng/dL — ABNORMAL LOW (ref 264–916)

## 2021-07-04 ENCOUNTER — Other Ambulatory Visit (HOSPITAL_BASED_OUTPATIENT_CLINIC_OR_DEPARTMENT_OTHER): Payer: Self-pay

## 2021-07-04 ENCOUNTER — Inpatient Hospital Stay: Payer: BC Managed Care – PPO

## 2021-07-04 ENCOUNTER — Telehealth: Payer: Self-pay | Admitting: *Deleted

## 2021-07-04 ENCOUNTER — Encounter (HOSPITAL_COMMUNITY): Payer: Self-pay | Admitting: Surgery

## 2021-07-04 VITALS — BP 99/68 | HR 73 | Temp 98.3°F | Resp 17

## 2021-07-04 DIAGNOSIS — C799 Secondary malignant neoplasm of unspecified site: Secondary | ICD-10-CM

## 2021-07-04 DIAGNOSIS — D509 Iron deficiency anemia, unspecified: Secondary | ICD-10-CM

## 2021-07-04 DIAGNOSIS — C73 Malignant neoplasm of thyroid gland: Secondary | ICD-10-CM | POA: Diagnosis not present

## 2021-07-04 MED ORDER — HYDROMORPHONE HCL 4 MG/ML IJ SOLN
4.0000 mg | Freq: Once | INTRAMUSCULAR | Status: AC
  Administered 2021-07-04: 4 mg via INTRAVENOUS
  Filled 2021-07-04: qty 1

## 2021-07-04 MED ORDER — SODIUM CHLORIDE 0.9 % IV SOLN: INTRAVENOUS | Status: DC

## 2021-07-04 NOTE — Telephone Encounter (Signed)
Patient called stating he is having excruciating pain in his right side "where the liver cancer is".  Informed patient that Dr Marin Olp is out of the office at this time.  Patient to come in for IV and pain medications

## 2021-07-04 NOTE — Progress Notes (Signed)
Faxed office notes and test results to [employer] [facility] on 07/04/21 for the pt's disability claim.  Fax was [phone]-awaiting confirmation.

## 2021-07-04 NOTE — Patient Instructions (Signed)

## 2021-07-07 ENCOUNTER — Other Ambulatory Visit (HOSPITAL_BASED_OUTPATIENT_CLINIC_OR_DEPARTMENT_OTHER): Payer: Self-pay

## 2021-07-07 ENCOUNTER — Encounter: Payer: Self-pay | Admitting: Hematology & Oncology

## 2021-07-07 ENCOUNTER — Encounter: Payer: Self-pay | Admitting: Family

## 2021-07-08 ENCOUNTER — Inpatient Hospital Stay: Payer: BC Managed Care – PPO

## 2021-07-08 ENCOUNTER — Other Ambulatory Visit: Payer: Self-pay

## 2021-07-08 ENCOUNTER — Other Ambulatory Visit (HOSPITAL_BASED_OUTPATIENT_CLINIC_OR_DEPARTMENT_OTHER): Payer: Self-pay

## 2021-07-08 VITALS — BP 101/65 | HR 98 | Temp 99.7°F | Resp 18

## 2021-07-08 DIAGNOSIS — C73 Malignant neoplasm of thyroid gland: Secondary | ICD-10-CM

## 2021-07-08 LAB — CMP (CANCER CENTER ONLY)
ALT: 19 U/L (ref 0–44)
AST: 45 U/L — ABNORMAL HIGH (ref 15–41)
Albumin: 3.6 g/dL (ref 3.5–5.0)
Alkaline Phosphatase: 326 U/L — ABNORMAL HIGH (ref 38–126)
Anion gap: 8 (ref 5–15)
BUN: 18 mg/dL (ref 6–20)
CO2: 27 mmol/L (ref 22–32)
Calcium: 8.6 mg/dL — ABNORMAL LOW (ref 8.9–10.3)
Chloride: 95 mmol/L — ABNORMAL LOW (ref 98–111)
Creatinine: 0.74 mg/dL (ref 0.61–1.24)
GFR, Estimated: >60 mL/min (ref >60)
Glucose, Bld: 112 mg/dL — ABNORMAL HIGH (ref 70–99)
Potassium: 4.4 mmol/L (ref 3.5–5.1)
Sodium: 130 mmol/L — ABNORMAL LOW (ref 135–145)
Total Bilirubin: 0.6 mg/dL (ref 0.3–1.2)
Total Protein: 6.8 g/dL (ref 6.5–8.1)

## 2021-07-08 LAB — CBC WITH DIFFERENTIAL (CANCER CENTER ONLY)
Abs Immature Granulocytes: 0.02 10*3/uL (ref 0.00–0.07)
Basophils Absolute: 0 10*3/uL (ref 0.0–0.1)
Basophils Relative: 0 %
Eosinophils Absolute: 0 10*3/uL (ref 0.0–0.5)
Eosinophils Relative: 1 %
HCT: 33.4 % — ABNORMAL LOW (ref 39.0–52.0)
Hemoglobin: 10.4 g/dL — ABNORMAL LOW (ref 13.0–17.0)
Immature Granulocytes: 0 %
Lymphocytes Relative: 8 %
Lymphs Abs: 0.4 10*3/uL — ABNORMAL LOW (ref 0.7–4.0)
MCH: 26.9 pg (ref 26.0–34.0)
MCHC: 31.1 g/dL (ref 30.0–36.0)
MCV: 86.3 fL (ref 80.0–100.0)
Monocytes Absolute: 0.9 10*3/uL (ref 0.1–1.0)
Monocytes Relative: 17 %
Neutro Abs: 3.9 10*3/uL (ref 1.7–7.7)
Neutrophils Relative %: 74 %
Platelet Count: 339 10*3/uL (ref 150–400)
RBC: 3.87 MIL/uL — ABNORMAL LOW (ref 4.22–5.81)
RDW: 16.6 % — ABNORMAL HIGH (ref 11.5–15.5)
WBC Count: 5.3 10*3/uL (ref 4.0–10.5)
nRBC: 0 % (ref 0.0–0.2)

## 2021-07-08 MED ORDER — HEPARIN SOD (PORK) LOCK FLUSH 100 UNIT/ML IV SOLN
500.0000 [IU] | Freq: Once | INTRAVENOUS | Status: AC
  Administered 2021-07-08: 500 [IU] via INTRAVENOUS

## 2021-07-08 MED ORDER — HYDROMORPHONE HCL 4 MG/ML IJ SOLN
4.0000 mg | Freq: Once | INTRAMUSCULAR | Status: AC
  Administered 2021-07-08: 4 mg via INTRAVENOUS
  Filled 2021-07-08: qty 1

## 2021-07-08 MED ORDER — SODIUM CHLORIDE 0.9 % IV SOLN: INTRAVENOUS | Status: DC

## 2021-07-08 MED ORDER — HYDROMORPHONE HCL 1 MG/ML IJ SOLN
2.0000 mg | Freq: Once | INTRAMUSCULAR | Status: AC
  Administered 2021-07-08: 2 mg via INTRAVENOUS
  Filled 2021-07-08: qty 2

## 2021-07-08 MED ORDER — SODIUM CHLORIDE 0.9% FLUSH
10.0000 mL | INTRAVENOUS | Status: DC | PRN
  Administered 2021-07-08: 10 mL via INTRAVENOUS

## 2021-07-09 ENCOUNTER — Inpatient Hospital Stay: Payer: BC Managed Care – PPO

## 2021-07-09 ENCOUNTER — Inpatient Hospital Stay (HOSPITAL_BASED_OUTPATIENT_CLINIC_OR_DEPARTMENT_OTHER): Payer: BC Managed Care – PPO | Admitting: Hematology & Oncology

## 2021-07-09 ENCOUNTER — Other Ambulatory Visit (HOSPITAL_BASED_OUTPATIENT_CLINIC_OR_DEPARTMENT_OTHER): Payer: Self-pay

## 2021-07-09 ENCOUNTER — Encounter: Payer: Self-pay | Admitting: Hematology & Oncology

## 2021-07-09 VITALS — Wt 252.0 lb

## 2021-07-09 VITALS — BP 111/73 | HR 82 | Temp 99.0°F | Resp 18

## 2021-07-09 DIAGNOSIS — C73 Malignant neoplasm of thyroid gland: Secondary | ICD-10-CM

## 2021-07-09 LAB — URINALYSIS, COMPLETE (UACMP) WITH MICROSCOPIC
Glucose, UA: NEGATIVE mg/dL
Hgb urine dipstick: NEGATIVE
Ketones, ur: NEGATIVE mg/dL
Leukocytes,Ua: NEGATIVE
Nitrite: NEGATIVE
Protein, ur: NEGATIVE mg/dL
Specific Gravity, Urine: 1.025 (ref 1.005–1.030)
pH: 5.5 (ref 5.0–8.0)

## 2021-07-09 MED ORDER — SODIUM CHLORIDE 0.9 % IV SOLN
1920.0000 mg/m2 | INTRAVENOUS | Status: DC
  Administered 2021-07-09: 4500 mg via INTRAVENOUS
  Filled 2021-07-09: qty 90

## 2021-07-09 MED ORDER — LEUCOVORIN CALCIUM INJECTION 350 MG
400.0000 mg/m2 | Freq: Once | INTRAVENOUS | Status: AC
  Administered 2021-07-09: 936 mg via INTRAVENOUS
  Filled 2021-07-09: qty 46.8

## 2021-07-09 MED ORDER — OXALIPLATIN CHEMO INJECTION 100 MG/20ML
68.0000 mg/m2 | Freq: Once | INTRAVENOUS | Status: AC
  Administered 2021-07-09: 160 mg via INTRAVENOUS
  Filled 2021-07-09: qty 32

## 2021-07-09 MED ORDER — PROCHLORPERAZINE MALEATE 10 MG PO TABS
10.0000 mg | ORAL_TABLET | Freq: Four times a day (QID) | ORAL | 1 refills | Status: DC | PRN
  Filled 2021-07-09: qty 30, 8d supply, fill #0
  Filled 2021-07-21: qty 30, 8d supply, fill #1

## 2021-07-09 MED ORDER — DEXTROSE 5 % IV SOLN: Freq: Once | INTRAVENOUS | Status: AC

## 2021-07-09 MED ORDER — LORAZEPAM 0.5 MG PO TABS: 0.5000 mg | ORAL_TABLET | Freq: Four times a day (QID) | ORAL | 0 refills | Status: DC | PRN

## 2021-07-09 MED ORDER — FLUOROURACIL CHEMO INJECTION 2.5 GM/50ML
400.0000 mg/m2 | Freq: Once | INTRAVENOUS | Status: AC
  Administered 2021-07-09: 950 mg via INTRAVENOUS
  Filled 2021-07-09: qty 19

## 2021-07-09 MED ORDER — DEXAMETHASONE 4 MG PO TABS
8.0000 mg | ORAL_TABLET | Freq: Every day | ORAL | 1 refills | Status: DC
  Filled 2021-07-09: qty 30, 15d supply, fill #0
  Filled 2021-08-04: qty 30, 15d supply, fill #1

## 2021-07-09 MED ORDER — DEXTROSE 5 % IV SOLN: Freq: Once | INTRAVENOUS | Status: DC

## 2021-07-09 MED ORDER — LIDOCAINE-PRILOCAINE 2.5-2.5 % EX CREA
TOPICAL_CREAM | CUTANEOUS | 3 refills | Status: DC
  Filled 2021-07-09: qty 30, 10d supply, fill #0
  Filled 2021-08-18: qty 30, 10d supply, fill #1

## 2021-07-09 MED ORDER — ONDANSETRON HCL 8 MG PO TABS
8.0000 mg | ORAL_TABLET | Freq: Two times a day (BID) | ORAL | 1 refills | Status: DC | PRN
  Filled 2021-07-09: qty 18, 9d supply, fill #0

## 2021-07-09 MED ORDER — PALONOSETRON HCL INJECTION 0.25 MG/5ML
0.2500 mg | Freq: Once | INTRAVENOUS | Status: AC
  Administered 2021-07-09: 0.25 mg via INTRAVENOUS
  Filled 2021-07-09: qty 5

## 2021-07-09 MED ORDER — SODIUM CHLORIDE 0.9 % IV SOLN
10.0000 mg | Freq: Once | INTRAVENOUS | Status: AC
  Administered 2021-07-09: 10 mg via INTRAVENOUS
  Filled 2021-07-09: qty 10

## 2021-07-09 NOTE — Progress Notes (Signed)
Hematology and Oncology Follow Up Visit  Brian [name] [id number] 1975-06-10 46 y.o. 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 + Bilateral pulmonary emboli without right heart strain  DVT left popliteal vein    Past Therapy: Cabometyx 40 mg po q day -- start on 04/17/2019 - d/c on 07/17/2019 for progression Lenvima 24 mg po q day -- on hold due to proteinuria Taxotere/CDDP -- s/p cycle 4 - started on 09/13/2019   Current Therapy:      Vinblastine q week x 4 weeks on / 1 week off -- s/p  cycle #2 -- start on 02/24/2021 -- d/c on 05/20/2021 Gavreto 400 mg po q day -- started on 05/17/2020  -- d/c on 06/16/2020 Adriamycin weekly 2 weeks on 1 week off - s/p cycle 8 --d/c on 04/16/2020 due to progression XRT for LEFT shoulder met -completed on 06/22/2021 Pembrolizumab 200 mg IV q 3 week -- s/p cycle #8 - start on 06/26/2020 -- d/c on 01/16/2021 due to progression Zometa 4 mg IV q 2 months - next dose on 08/2021 Eliquis 5 mg po BID FOLFOX -- start cycle #1 on 07/08/2021   Interim History:  Brian [name] is here today for follow-up.  Unfortunately, he had a reaction to the Nexavar.  We had him on Nexavar.  A week after he started the next far, had a bad rash.  He subsequently had to stop this.  He did have radiation therapy.  This was for his left shoulder.  This did seem to help a little bit.  There are some rare articles regarding the use of FOLFOX with metastatic thyroid cancer.  I think this would be reasonable to try.  He still has a decent performance status says that he would be able to handle treatment.  Again, I realize that there is no standard protocols for metastatic Hurthle cell cancer.  You will not find anything in NCCN.  There will be nothing in any textbook.  I do think that the use of FOLFOX would not be a bad idea.  Again I think he could tolerate this.  I talked him about this.  He is willing to give this a  try.  He already has a Port-A-Cath in place so I think this would work.  These blood counts can certainly tolerate the protocol.  He does have pain.  However the radiation does seem to help his left shoulder.  He has had no problems going to the bathroom.  He has had no issues with nausea or vomiting.  He has had no cough or shortness of breath.  He has lost a little bit of weight.  I suppose I should not be surprised by this.  When he sneezes, he says he has pain in the right shoulder and back.  I suspect this probably is from his liver met.  The supplemental testosterone that we given is making him feel better.  He has little more energy and more stamina.  I am happy about this.  Currently, I would say his performance status is probably ECOG 1.      Medications:  Allergies as of 07/09/2021       Reactions   Dextromethorphan-guaifenesin Other (See Comments)   Irregular heartbeat   Doxycycline Anaphylaxis, Nausea And Vomiting, Rash, Other (See Comments)   "heart arrythmia" and "dyspepsia" (only oral doxycycline causes reaction)   Gadobutrol Hives, Other (See Comments)   Patient  had MRI scan at [facility]. Patient called one hour after he left imaging facility to report two "blisters" that came up on "back" of lip.    Gadolinium Derivatives Hives   Patient had MRI scan at [facility]. Patient called one hour after he left imaging facility to report two "blisters" that came up on "back" of lip.    Guaifenesin Palpitations      Ibuprofen Hives, Itching   Lisinopril Other (See Comments)   Angioedema   Pantoprazole Itching   Tramadol Hives, Itching   Nexavar [sorafenib] Hives   Barium Rash   Developed redness around neck after drinking 1st bottle of Barocat; pt was given Benedryl by ED Mds to be "on the safe side" before drinking 2nd bottle   Chlorhexidine Hives        Medication List        Accurate as of Jul 09, 2021  5:01 PM. If you have any questions, ask  your nurse or doctor.          cyclobenzaprine 10 MG tablet Commonly known as: FLEXERIL Take 1 tablet (10 mg total) by mouth 3 (three) times daily as needed.   dexamethasone 4 MG tablet Commonly known as: DECADRON Take 2 tablets (8 mg total) by mouth daily. Start the day after chemotherapy for 2 days. Take with food.   diphenhydrAMINE 25 mg capsule Commonly known as: BENADRYL TAKE 1 CAPSULE BY MOUTH 1 HOUR BEFORE CT SCAN   Eliquis 5 MG Tabs tablet Generic drug: apixaban TAKE 1 TABLET (5 MG TOTAL) BY MOUTH 2 (TWO) TIMES DAILY.   EPINEPHrine 0.3 mg/0.3 mL Soaj injection Commonly known as: EpiPen 2-Pak USE AS DIRECTED FOR LIFE THREATENING ALLERGIC REACTIONS   famotidine 40 MG tablet Commonly known as: PEPCID Take 1 tablet (40 mg total) by mouth 2 (two) times daily.   Fusion Plus Caps Take 1 capsule by mouth daily.   HYDROmorphone 8 MG tablet Commonly known as: DILAUDID Take 1 tablet (8 mg total) by mouth every 4 (four) hours as needed for severe pain.   levothyroxine 200 MCG tablet Commonly known as: SYNTHROID Take one tablet (200 mcg dose) by mouth daily. Total:  288 mcg/day.   lidocaine-prilocaine cream Commonly known as: EMLA Apply to affected area once   LORazepam 1 MG tablet Commonly known as: ATIVAN Take 1 tablet (1 mg total) by mouth every 6 (six) hours as needed for anxiety. What changed: Another medication with the same name was added. Make sure you understand how and when to take each.   LORazepam 0.5 MG tablet Commonly known as: Ativan Take 1 tablet (0.5 mg total) by mouth every 6 (six) hours as needed (Nausea or vomiting). What changed: You were already taking a medication with the same name, and this prescription was added. Make sure you understand how and when to take each.   methylphenidate 10 MG tablet Commonly known as: RITALIN Take 1 tablet (10 mg total) by mouth 2 (two) times daily.   metoCLOPramide 10 MG tablet Commonly known as:  REGLAN Take 1 tablet (10 mg total) by mouth every 6 (six) hours as needed for nausea or vomiting.   metoprolol tartrate 50 MG tablet Commonly known as: Lopressor Take 1 tablet (50 mg total) by mouth 2 (two) times daily.   multivitamin capsule Take 1 capsule by mouth daily.   OLANZapine 10 MG tablet Commonly known as: ZYPREXA TAKE 1 TABLET (10 MG TOTAL) BY MOUTH AT BEDTIME.   ondansetron 8 MG tablet  Commonly known as: Zofran Take 1 tablet (8 mg total) by mouth 2 (two) times daily as needed for refractory nausea / vomiting. Start on day 3 after chemotherapy.   polyethylene glycol 17 g packet Commonly known as: MIRALAX / GLYCOLAX Take 17 g by mouth daily as needed for mild constipation.   potassium chloride SA 20 MEQ tablet Commonly known as: KLOR-CON M Take 1 tablet (20 mEq total) by mouth daily.   predniSONE 50 MG tablet Commonly known as: DELTASONE TAKE 1 TABLET BY MOUTH 13 HOURS BEFORE CT SCAN, 1 TABLET 7 HOURS BEFORE CT SCAN, AND 1 TABLET 1 HOUR BEFORE CT SCAN   prochlorperazine 10 MG tablet Commonly known as: COMPAZINE Take 1 tablet (10 mg total) by mouth every 6 (six) hours as needed (Nausea or vomiting). What changed: Another medication with the same name was added. Make sure you understand how and when to take each.   prochlorperazine 10 MG tablet Commonly known as: COMPAZINE Take 1 tablet (10 mg total) by mouth every 6 (six) hours as needed (Nausea or vomiting). What changed: You were already taking a medication with the same name, and this prescription was added. Make sure you understand how and when to take each.   promethazine 25 MG tablet Commonly known as: PHENERGAN TAKE 1 TABLET BY MOUTH EVERY 6 HOURS AS NEEDED FOR NAUSEA & VOMITING   senna-docusate 8.6-50 MG tablet Commonly known as: Senokot-S Take 1 tablet by mouth at bedtime as needed for mild constipation.   temazepam 30 MG capsule Commonly known as: RESTORIL Take 1 capsule (30 mg total) by mouth at  bedtime as needed for sleep.        Allergies:  Allergies  Allergen Reactions   Dextromethorphan-Guaifenesin Other (See Comments)    Irregular heartbeat    Doxycycline Anaphylaxis, Nausea And Vomiting, Rash and Other (See Comments)    "heart arrythmia" and "dyspepsia" (only oral doxycycline causes reaction)   Gadobutrol Hives and Other (See Comments)    Patient had MRI scan at [facility]. Patient called one hour after he left imaging facility to report two "blisters" that came up on "back" of lip.     Gadolinium Derivatives Hives    Patient had MRI scan at [facility]. Patient called one hour after he left imaging facility to report two "blisters" that came up on "back" of lip.    Guaifenesin Palpitations        Ibuprofen Hives and Itching   Lisinopril Other (See Comments)    Angioedema   Pantoprazole Itching   Tramadol Hives and Itching   Nexavar [Sorafenib] Hives   Barium Rash    Developed redness around neck after drinking 1st bottle of Barocat; pt was given Benedryl by ED Mds to be "on the safe side" before drinking 2nd bottle   Chlorhexidine Hives    Past Medical History, Surgical history, Social history, and Family History were reviewed and updated.  Review of Systems: Review of Systems  Constitutional:  Positive for malaise/fatigue.  HENT: Negative.    Eyes: Negative.   Respiratory: Negative.    Cardiovascular: Negative.   Gastrointestinal:  Positive for abdominal pain, constipation, heartburn and nausea.  Genitourinary: Negative.   Musculoskeletal:  Positive for joint pain.  Skin: Negative.   Neurological: Negative.   Endo/Heme/Allergies: Negative.   Psychiatric/Behavioral: Negative.      Physical Exam:  weight is 252 lb (114.3 kg).   Wt Readings from Last 3 Encounters:  07/09/21 252 lb (114.3 kg)  07/01/21  245 lb (111.1 kg)  06/17/21 246 lb (111.6 kg)  His vital signs are temperature of 98.7.  Pulse 114.  Blood pressure 129/73.  Weight  is 257 pounds.  Physical Exam Vitals reviewed.  HENT:     Head: Normocephalic and atraumatic.  Eyes:     Pupils: Pupils are equal, round, and reactive to light.  Cardiovascular:     Heart sounds: Normal heart sounds.     Comments: Cardiac exam shows a tachycardic rate.  He has occasional extra beat.  I do not hear any obvious murmurs or rubs. Pulmonary:     Effort: Pulmonary effort is normal.     Breath sounds: Normal breath sounds.  Abdominal:     General: Bowel sounds are normal.     Palpations: Abdomen is soft.  Musculoskeletal:        General: No tenderness or deformity. Normal range of motion.     Cervical back: Normal range of motion.  Lymphadenopathy:     Cervical: No cervical adenopathy.  Skin:    General: Skin is warm and dry.     Findings: No erythema or rash.  Neurological:     Mental Status: He is alert and oriented to person, place, and time.  Psychiatric:        Behavior: Behavior normal.        Thought Content: Thought content normal.        Judgment: Judgment normal.     Lab Results  Component Value Date   WBC 5.3 07/08/2021   HGB 10.4 (L) 07/08/2021   HCT 33.4 (L) 07/08/2021   MCV 86.3 07/08/2021   PLT 339 07/08/2021   Lab Results  Component Value Date   FERRITIN 959 (H) 04/24/2021   IRON 61 04/24/2021   TIBC 228 (L) 04/24/2021   UIBC 167 04/24/2021   IRONPCTSAT 27 04/24/2021   Lab Results  Component Value Date   RETICCTPCT 3.9 (H) 08/08/2020   RBC 3.87 (L) 07/08/2021   No results found for: KPAFRELGTCHN, LAMBDASER, KAPLAMBRATIO No results found for: IGGSERUM, IGA, IGMSERUM No results found for: Will Bonnet, GAMS, MSPIKE, SPEI   Chemistry      Component Value Date/Time   NA 130 (L) 07/08/2021 1400   NA 141 02/06/2019 1210   K 4.4 07/08/2021 1400   CL 95 (L) 07/08/2021 1400   CO2 27 07/08/2021 1400   BUN 18 07/08/2021 1400   BUN 8 02/06/2019 1210   CREATININE 0.74 07/08/2021 1400       Component Value Date/Time   CALCIUM 8.6 (L) 07/08/2021 1400   ALKPHOS 326 (H) 07/08/2021 1400   AST 45 (H) 07/08/2021 1400   ALT 19 07/08/2021 1400   BILITOT 0.6 07/08/2021 1400       Impression and Plan: Brian [name] is a very pleasant 46 yo caucasian gentleman with an 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   We will go ahead and try FOLFOX.  Again I had believe this will be the last course of therapy that we can try on him.  I just cannot think of anything else that would be helpful.  I cannot find anything in the literature about any other protocol.  As always, we really have to see about it the insurance company approving this.  I have to reiterate again that there is NO standard therapies listed in the NCCN guidelines.  I do think that FOLFOX is reasonable given his performance status is still  good.  We will go ahead with his Zometa today.  He will get some IV fluid today.  We will try him started next week.  I would like to try to get him 4 cycles and then repeat his scans.  I will plan to see him back myself for his second cycle.  [name], MD 5/24/20235:01 PM

## 2021-07-11 ENCOUNTER — Other Ambulatory Visit (HOSPITAL_BASED_OUTPATIENT_CLINIC_OR_DEPARTMENT_OTHER): Payer: Self-pay

## 2021-07-11 ENCOUNTER — Inpatient Hospital Stay: Payer: BC Managed Care – PPO

## 2021-07-11 VITALS — BP 120/80 | HR 87 | Temp 97.6°F | Resp 18

## 2021-07-11 DIAGNOSIS — C73 Malignant neoplasm of thyroid gland: Secondary | ICD-10-CM

## 2021-07-11 MED ORDER — HEPARIN SOD (PORK) LOCK FLUSH 100 UNIT/ML IV SOLN
500.0000 [IU] | Freq: Once | INTRAVENOUS | Status: AC | PRN
  Administered 2021-07-11: 500 [IU]

## 2021-07-11 MED ORDER — LEVOTHYROXINE SODIUM 200 MCG PO TABS
ORAL_TABLET | ORAL | 1 refills | Status: DC
Start: 2021-07-11 — End: 2022-07-02
  Filled 2021-07-11 – 2021-07-15 (×2): qty 90, 90d supply, fill #0
  Filled 2021-11-28: qty 90, 90d supply, fill #1

## 2021-07-11 MED ORDER — SODIUM CHLORIDE 0.9% FLUSH
10.0000 mL | INTRAVENOUS | Status: DC | PRN
  Administered 2021-07-11: 10 mL

## 2021-07-11 NOTE — Patient Instructions (Signed)
[facility]  Discharge Instructions: Thank you for choosing [facility] to provide your oncology and hematology care.   If you have a lab appointment with the [facility], please go directly to the [facility] and check in at the registration area.  Wear comfortable clothing and clothing appropriate for easy access to any Portacath or PICC line.   We strive to give you quality time with your provider. You may need to reschedule your appointment if you arrive late (15 or more minutes).  Arriving late affects you and other patients whose appointments are after yours.  Also, if you miss three or more appointments without notifying the office, you may be dismissed from the clinic at the provider's discretion.      For prescription refill requests, have your pharmacy contact our office and allow 72 hours for refills to be completed.    Today you received the following chemotherapy and/or immunotherapy agents 5-fu pump d'c     To help prevent nausea and vomiting after your treatment, we encourage you to take your nausea medication as directed.  BELOW ARE SYMPTOMS THAT SHOULD BE REPORTED IMMEDIATELY: *FEVER GREATER THAN 100.4 F (38 C) OR HIGHER *CHILLS OR SWEATING *NAUSEA AND VOMITING THAT IS NOT CONTROLLED WITH YOUR NAUSEA MEDICATION *UNUSUAL SHORTNESS OF BREATH *UNUSUAL BRUISING OR BLEEDING *URINARY PROBLEMS (pain or burning when urinating, or frequent urination) *BOWEL PROBLEMS (unusual diarrhea, constipation, pain near the anus) TENDERNESS IN MOUTH AND THROAT WITH OR WITHOUT PRESENCE OF ULCERS (sore throat, sores in mouth, or a toothache) UNUSUAL RASH, SWELLING OR PAIN  UNUSUAL VAGINAL DISCHARGE OR ITCHING   Items with * indicate a potential emergency and should be followed up as soon as possible or go to the Emergency Department if any problems should occur.  Please show the CHEMOTHERAPY ALERT CARD or IMMUNOTHERAPY ALERT CARD at check-in to  the Emergency Department and triage nurse. Should you have questions after your visit or need to cancel or reschedule your appointment, please contact [facility]  [phone] and follow the prompts.  Office hours are 8:00 a.m. to 4:30 p.m. Monday - Friday. Please note that voicemails left after 4:00 p.m. may not be returned until the following business day.  We are closed weekends and major holidays. You have access to a nurse at all times for urgent questions. Please call the main number to the clinic [phone] and follow the prompts.  For any non-urgent questions, you may also contact your provider using MyChart. We now offer e-Visits for anyone 15 and older to request care online for non-urgent symptoms. For details visit [email or web address].   Also download the MyChart app! Go to the app store, search "MyChart", open the app, select [facility], and log in with your MyChart username and password.  Due to Covid, a mask is required upon entering the hospital/clinic. If you do not have a mask, one will be given to you upon arrival. For doctor visits, patients may have 1 support person aged 46 or older with them. For treatment visits, patients cannot have anyone with them due to current Covid guidelines and our immunocompromised population.

## 2021-07-15 ENCOUNTER — Other Ambulatory Visit (HOSPITAL_BASED_OUTPATIENT_CLINIC_OR_DEPARTMENT_OTHER): Payer: Self-pay

## 2021-07-17 ENCOUNTER — Ambulatory Visit: Payer: Self-pay | Admitting: Radiation Oncology

## 2021-07-21 ENCOUNTER — Encounter: Payer: Self-pay | Admitting: Family

## 2021-07-21 ENCOUNTER — Other Ambulatory Visit (HOSPITAL_BASED_OUTPATIENT_CLINIC_OR_DEPARTMENT_OTHER): Payer: Self-pay

## 2021-07-21 ENCOUNTER — Other Ambulatory Visit: Payer: Self-pay | Admitting: Hematology & Oncology

## 2021-07-21 ENCOUNTER — Encounter: Payer: Self-pay | Admitting: Hematology & Oncology

## 2021-07-21 DIAGNOSIS — G893 Neoplasm related pain (acute) (chronic): Secondary | ICD-10-CM

## 2021-07-21 DIAGNOSIS — C73 Malignant neoplasm of thyroid gland: Secondary | ICD-10-CM

## 2021-07-21 DIAGNOSIS — C787 Secondary malignant neoplasm of liver and intrahepatic bile duct: Secondary | ICD-10-CM

## 2021-07-21 DIAGNOSIS — M546 Pain in thoracic spine: Secondary | ICD-10-CM

## 2021-07-21 MED ORDER — APIXABAN 5 MG PO TABS
ORAL_TABLET | Freq: Two times a day (BID) | ORAL | 6 refills | Status: DC
  Filled 2021-07-21: qty 60, 30d supply, fill #0
  Filled 2021-08-18: qty 60, 30d supply, fill #1
  Filled 2021-09-22: qty 60, 30d supply, fill #2
  Filled 2021-11-04: qty 60, 30d supply, fill #3
  Filled 2021-12-08: qty 60, 30d supply, fill #4
  Filled 2022-01-21: qty 60, 30d supply, fill #5
  Filled 2022-03-09: qty 60, 30d supply, fill #6

## 2021-07-21 MED ORDER — XTAMPZA ER 36 MG PO C12A
36.0000 mg | EXTENDED_RELEASE_CAPSULE | Freq: Two times a day (BID) | ORAL | 0 refills | Status: DC
  Filled 2021-07-21: qty 60, 30d supply, fill #0

## 2021-07-21 MED ORDER — TEMAZEPAM 30 MG PO CAPS
30.0000 mg | ORAL_CAPSULE | Freq: Every evening | ORAL | 0 refills | Status: DC | PRN
  Filled 2021-07-21 – 2021-08-18 (×2): qty 30, 30d supply, fill #0

## 2021-07-22 ENCOUNTER — Inpatient Hospital Stay: Payer: BC Managed Care – PPO

## 2021-07-22 ENCOUNTER — Other Ambulatory Visit: Payer: Self-pay | Admitting: Hematology & Oncology

## 2021-07-22 ENCOUNTER — Other Ambulatory Visit: Payer: Self-pay

## 2021-07-22 ENCOUNTER — Other Ambulatory Visit (HOSPITAL_BASED_OUTPATIENT_CLINIC_OR_DEPARTMENT_OTHER): Payer: Self-pay

## 2021-07-22 ENCOUNTER — Encounter: Payer: Self-pay | Admitting: Hematology & Oncology

## 2021-07-22 ENCOUNTER — Inpatient Hospital Stay: Payer: BC Managed Care – PPO | Attending: Hematology & Oncology | Admitting: Hematology & Oncology

## 2021-07-22 VITALS — BP 116/92 | HR 128 | Temp 98.2°F | Resp 20 | Wt 239.1 lb

## 2021-07-22 VITALS — BP 122/84 | HR 78 | Resp 17

## 2021-07-22 DIAGNOSIS — C787 Secondary malignant neoplasm of liver and intrahepatic bile duct: Secondary | ICD-10-CM | POA: Diagnosis present

## 2021-07-22 DIAGNOSIS — M546 Pain in thoracic spine: Secondary | ICD-10-CM

## 2021-07-22 DIAGNOSIS — C73 Malignant neoplasm of thyroid gland: Secondary | ICD-10-CM

## 2021-07-22 DIAGNOSIS — E038 Other specified hypothyroidism: Secondary | ICD-10-CM

## 2021-07-22 DIAGNOSIS — Z5111 Encounter for antineoplastic chemotherapy: Secondary | ICD-10-CM | POA: Insufficient documentation

## 2021-07-22 DIAGNOSIS — R110 Nausea: Secondary | ICD-10-CM

## 2021-07-22 DIAGNOSIS — E291 Testicular hypofunction: Secondary | ICD-10-CM

## 2021-07-22 DIAGNOSIS — C799 Secondary malignant neoplasm of unspecified site: Secondary | ICD-10-CM

## 2021-07-22 DIAGNOSIS — I824Z2 Acute embolism and thrombosis of unspecified deep veins of left distal lower extremity: Secondary | ICD-10-CM

## 2021-07-22 DIAGNOSIS — Z79899 Other long term (current) drug therapy: Secondary | ICD-10-CM | POA: Diagnosis not present

## 2021-07-22 DIAGNOSIS — D500 Iron deficiency anemia secondary to blood loss (chronic): Secondary | ICD-10-CM

## 2021-07-22 LAB — CMP (CANCER CENTER ONLY)
ALT: 24 U/L (ref 0–44)
AST: 29 U/L (ref 15–41)
Albumin: 4 g/dL (ref 3.5–5.0)
Alkaline Phosphatase: 203 U/L — ABNORMAL HIGH (ref 38–126)
Anion gap: 11 (ref 5–15)
BUN: 14 mg/dL (ref 6–20)
CO2: 24 mmol/L (ref 22–32)
Calcium: 9.7 mg/dL (ref 8.9–10.3)
Chloride: 101 mmol/L (ref 98–111)
Creatinine: 0.99 mg/dL (ref 0.61–1.24)
GFR, Estimated: >60 mL/min (ref >60)
Glucose, Bld: 104 mg/dL — ABNORMAL HIGH (ref 70–99)
Potassium: 4.2 mmol/L (ref 3.5–5.1)
Sodium: 136 mmol/L (ref 135–145)
Total Bilirubin: 1 mg/dL (ref 0.3–1.2)
Total Protein: 6.9 g/dL (ref 6.5–8.1)

## 2021-07-22 LAB — TSH: TSH: 9.453 u[IU]/mL — ABNORMAL HIGH (ref 0.350–4.500)

## 2021-07-22 LAB — CBC WITH DIFFERENTIAL (CANCER CENTER ONLY)
Abs Immature Granulocytes: 0.02 10*3/uL (ref 0.00–0.07)
Basophils Absolute: 0 10*3/uL (ref 0.0–0.1)
Basophils Relative: 1 %
Eosinophils Absolute: 0.1 10*3/uL (ref 0.0–0.5)
Eosinophils Relative: 1 %
HCT: 40 % (ref 39.0–52.0)
Hemoglobin: 12.1 g/dL — ABNORMAL LOW (ref 13.0–17.0)
Immature Granulocytes: 1 %
Lymphocytes Relative: 8 %
Lymphs Abs: 0.4 10*3/uL — ABNORMAL LOW (ref 0.7–4.0)
MCH: 26.8 pg (ref 26.0–34.0)
MCHC: 30.3 g/dL (ref 30.0–36.0)
MCV: 88.5 fL (ref 80.0–100.0)
Monocytes Absolute: 0.6 10*3/uL (ref 0.1–1.0)
Monocytes Relative: 15 %
Neutro Abs: 3.3 10*3/uL (ref 1.7–7.7)
Neutrophils Relative %: 74 %
Platelet Count: 244 10*3/uL (ref 150–400)
RBC: 4.52 MIL/uL (ref 4.22–5.81)
RDW: 19.9 % — ABNORMAL HIGH (ref 11.5–15.5)
WBC Count: 4.4 10*3/uL (ref 4.0–10.5)
nRBC: 0 % (ref 0.0–0.2)

## 2021-07-22 LAB — IRON AND IRON BINDING CAPACITY (CC-WL,HP ONLY)
Iron: 78 ug/dL (ref 45–182)
Saturation Ratios: 26 % (ref 17.9–39.5)
TIBC: 305 ug/dL (ref 250–450)
UIBC: 227 ug/dL (ref 117–376)

## 2021-07-22 LAB — FERRITIN: Ferritin: 1150 ng/mL — ABNORMAL HIGH (ref 24–336)

## 2021-07-22 LAB — SAMPLE TO BLOOD BANK

## 2021-07-22 LAB — LACTATE DEHYDROGENASE: LDH: 217 U/L — ABNORMAL HIGH (ref 98–192)

## 2021-07-22 MED ORDER — POLYETHYLENE GLYCOL 3350 17 GM/SCOOP PO POWD
17.0000 g | Freq: Every day | ORAL | 0 refills | Status: DC | PRN
  Filled 2021-07-22: qty 238, 14d supply, fill #0

## 2021-07-22 MED ORDER — PALONOSETRON HCL INJECTION 0.25 MG/5ML
0.2500 mg | Freq: Once | INTRAVENOUS | Status: AC
  Administered 2021-07-22: 0.25 mg via INTRAVENOUS
  Filled 2021-07-22: qty 5

## 2021-07-22 MED ORDER — OXALIPLATIN CHEMO INJECTION 100 MG/20ML
68.0000 mg/m2 | Freq: Once | INTRAVENOUS | Status: AC
  Administered 2021-07-22: 160 mg via INTRAVENOUS
  Filled 2021-07-22: qty 32

## 2021-07-22 MED ORDER — SODIUM CHLORIDE 0.9 % IV SOLN
10.0000 mg | Freq: Once | INTRAVENOUS | Status: AC
  Administered 2021-07-22: 10 mg via INTRAVENOUS
  Filled 2021-07-22: qty 10

## 2021-07-22 MED ORDER — HEPARIN SOD (PORK) LOCK FLUSH 100 UNIT/ML IV SOLN: 500.0000 [IU] | Freq: Once | INTRAVENOUS | Status: DC | PRN

## 2021-07-22 MED ORDER — SODIUM CHLORIDE 0.9% FLUSH: 10.0000 mL | INTRAVENOUS | Status: DC | PRN

## 2021-07-22 MED ORDER — HYDROMORPHONE HCL 4 MG/ML IJ SOLN: 4.0000 mg | Freq: Once | INTRAMUSCULAR | Status: DC

## 2021-07-22 MED ORDER — HYDROMORPHONE HCL 4 MG/ML IJ SOLN
4.0000 mg | Freq: Once | INTRAMUSCULAR | Status: AC
  Administered 2021-07-22: 4 mg via INTRAVENOUS
  Filled 2021-07-22: qty 1

## 2021-07-22 MED ORDER — PROMETHAZINE HCL 25 MG PO TABS
ORAL_TABLET | ORAL | 0 refills | Status: DC
  Filled 2021-07-22: qty 120, 30d supply, fill #0

## 2021-07-22 MED ORDER — SODIUM CHLORIDE 0.9 % IV SOLN
1920.0000 mg/m2 | INTRAVENOUS | Status: DC
  Administered 2021-07-22: 4500 mg via INTRAVENOUS
  Filled 2021-07-22: qty 90

## 2021-07-22 MED ORDER — LEUCOVORIN CALCIUM INJECTION 350 MG
400.0000 mg/m2 | Freq: Once | INTRAVENOUS | Status: AC
  Administered 2021-07-22: 936 mg via INTRAVENOUS
  Filled 2021-07-22: qty 46.8

## 2021-07-22 MED ORDER — DEXTROSE 5 % IV SOLN: Freq: Once | INTRAVENOUS | Status: AC

## 2021-07-22 MED ORDER — FLUOROURACIL CHEMO INJECTION 2.5 GM/50ML
400.0000 mg/m2 | Freq: Once | INTRAVENOUS | Status: AC
  Administered 2021-07-22: 950 mg via INTRAVENOUS
  Filled 2021-07-22: qty 19

## 2021-07-22 NOTE — Patient Instructions (Signed)
[facility] [place]  Discharge Instructions: Thank you for choosing [facility] to provide your oncology and hematology care.   If you have a lab appointment with the [facility], please go directly to the [facility] and check in at the registration area.  Wear comfortable clothing and clothing appropriate for easy access to any Portacath or PICC line.   We strive to give you quality time with your provider. You may need to reschedule your appointment if you arrive late (15 or more minutes).  Arriving late affects you and other patients whose appointments are after yours.  Also, if you miss three or more appointments without notifying the office, you may be dismissed from the clinic at the provider's discretion.      For prescription refill requests, have your pharmacy contact our office and allow 72 hours for refills to be completed.    Today you received the following chemotherapy and/or immunotherapy agents 5FU, Oxaliplatin, Leucovorin      To help prevent nausea and vomiting after your treatment, we encourage you to take your nausea medication as directed.  BELOW ARE SYMPTOMS THAT SHOULD BE REPORTED IMMEDIATELY: *FEVER GREATER THAN 100.4 F (38 C) OR HIGHER *CHILLS OR SWEATING *NAUSEA AND VOMITING THAT IS NOT CONTROLLED WITH YOUR NAUSEA MEDICATION *UNUSUAL SHORTNESS OF BREATH *UNUSUAL BRUISING OR BLEEDING *URINARY PROBLEMS (pain or burning when urinating, or frequent urination) *BOWEL PROBLEMS (unusual diarrhea, constipation, pain near the anus) TENDERNESS IN MOUTH AND THROAT WITH OR WITHOUT PRESENCE OF ULCERS (sore throat, sores in mouth, or a toothache) UNUSUAL RASH, SWELLING OR PAIN  UNUSUAL VAGINAL DISCHARGE OR ITCHING   Items with * indicate a potential emergency and should be followed up as soon as possible or go to the Emergency Department if any problems should occur.  Please show the CHEMOTHERAPY ALERT CARD or IMMUNOTHERAPY ALERT CARD  at check-in to the Emergency Department and triage nurse. Should you have questions after your visit or need to cancel or reschedule your appointment, please contact [facility]  [phone] and follow the prompts.  Office hours are 8:00 a.m. to 4:30 p.m. Monday - Friday. Please note that voicemails left after 4:00 p.m. may not be returned until the following business day.  We are closed weekends and major holidays. You have access to a nurse at all times for urgent questions. Please call the main number to the clinic [phone] and follow the prompts.  For any non-urgent questions, you may also contact your provider using MyChart. We now offer e-Visits for anyone 46 and older to request care online for non-urgent symptoms. For details visit [email or web address].   Also download the MyChart app! Go to the app store, search "MyChart", open the app, select [facility], and log in with your MyChart username and password.  Due to Covid, a mask is required upon entering the hospital/clinic. If you do not have a mask, one will be given to you upon arrival. For doctor visits, patients may have 1 support person aged 46 or older with them. For treatment visits, patients cannot have anyone with them due to current Covid guidelines and our immunocompromised population.

## 2021-07-22 NOTE — Progress Notes (Signed)
Hematology and Oncology Follow Up Visit  Brian [name] [id number] 30-May-1975 46 y.o. 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 + Bilateral pulmonary emboli without right heart strain  DVT left popliteal vein    Past Therapy: Cabometyx 40 mg po q day -- start on 04/17/2019 - d/c on 07/17/2019 for progression Lenvima 24 mg po q day -- on hold due to proteinuria Taxotere/CDDP -- s/p cycle 4 - started on 09/13/2019   Current Therapy:      Vinblastine q week x 4 weeks on / 1 week off -- s/p  cycle #2 -- start on 02/24/2021 -- d/c on 05/20/2021 Gavreto 400 mg po q day -- started on 05/17/2020  -- d/c on 06/16/2020 Adriamycin weekly 2 weeks on 1 week off - s/p cycle 8 --d/c on 04/16/2020 due to progression XRT for LEFT shoulder met -completed on 06/22/2021 Pembrolizumab 200 mg IV q 3 week -- s/p cycle #8 - start on 06/26/2020 -- d/c on 01/16/2021 due to progression Zometa 4 mg IV q 2 months - next dose on 08/2021 Eliquis 5 mg po BID FOLFOX -- s/p cycle #1 -- start on 07/08/2021   Interim History:  [name] is here today for follow-up.  He actually looks quite good.  He tolerated his first cycle of FOLFOX well.  He still has pain in the right upper quadrant.  He does have a kidney stone over on the right side from what he says.  This could certainly be causing some of the pain.  However, I think the pain is probably coming from his tumor itself.  He has had no nausea or vomiting.  He has had some sweats.  He has had no problems with bowels or bladder.  There is been no bleeding.  He is on Eliquis because of pulmonary embolism.  He is family to have a nice time in [facility] I think over [place] Day weekend.  I am just happy that he was able to enjoy himself.  I would like to hope that, by his look today, that he might be responding.  I realize that is little bit early for [place] to know.  He has had no rashes.  He has had  no obvious leg swelling.  Overall, I would say his performance status is probably ECOG 1.     Medications:  Allergies as of 07/22/2021       Reactions   Dextromethorphan-guaifenesin Other (See Comments)   Irregular heartbeat   Doxycycline Anaphylaxis, Nausea And Vomiting, Rash, Other (See Comments)   "heart arrythmia" and "dyspepsia" (only oral doxycycline causes reaction)   Gadobutrol Hives, Other (See Comments)   Patient had MRI scan at [place]. Patient called one hour after he left imaging facility to report two "blisters" that came up on "back" of lip.    Gadolinium Derivatives Hives   Patient had MRI scan at [place]. Patient called one hour after he left imaging facility to report two "blisters" that came up on "back" of lip.    Guaifenesin Palpitations      Ibuprofen Hives, Itching   Lisinopril Other (See Comments)   Angioedema   Pantoprazole Itching   Tramadol Hives, Itching   Nexavar [sorafenib] Hives   Barium Rash   Developed redness around neck after drinking 1st bottle of Barocat; pt was given Benedryl by ED Mds to be "on the safe side" before drinking 2nd bottle  Chlorhexidine Hives        Medication List        Accurate as of July 22, 2021  2:23 PM. If you have any questions, ask your nurse or doctor.          STOP taking these medications    polyethylene glycol 17 g packet Commonly known as: MIRALAX / GLYCOLAX Replaced by: polyethylene glycol powder 17 GM/SCOOP powder Stopped by: [name], MD       TAKE these medications    cyclobenzaprine 10 MG tablet Commonly known as: FLEXERIL Take 1 tablet (10 mg total) by mouth 3 (three) times daily as needed.   dexamethasone 4 MG tablet Commonly known as: DECADRON Take 2 tablets (8 mg total) by mouth daily. Start the day after chemotherapy for 2 days. Take with food.   diphenhydrAMINE 25 mg capsule Commonly known as: BENADRYL TAKE 1 CAPSULE BY MOUTH 1 HOUR BEFORE CT SCAN    Eliquis 5 MG Tabs tablet Generic drug: apixaban TAKE 1 TABLET (5 MG TOTAL) BY MOUTH 2 (TWO) TIMES DAILY.   EPINEPHrine 0.3 mg/0.3 mL Soaj injection Commonly known as: EpiPen 2-Pak USE AS DIRECTED FOR LIFE THREATENING ALLERGIC REACTIONS   famotidine 40 MG tablet Commonly known as: PEPCID Take 1 tablet (40 mg total) by mouth 2 (two) times daily.   Fusion Plus Caps Take 1 capsule by mouth daily.   HYDROmorphone 8 MG tablet Commonly known as: DILAUDID Take 1 tablet (8 mg total) by mouth every 4 (four) hours as needed for severe pain.   levothyroxine 200 MCG tablet Commonly known as: SYNTHROID Take one tablet (200 mcg dose) by mouth daily. Total:  288 mcg/day.   lidocaine-prilocaine cream Commonly known as: EMLA Apply to affected area once   LORazepam 1 MG tablet Commonly known as: ATIVAN Take 1 tablet (1 mg total) by mouth every 6 (six) hours as needed for anxiety.   LORazepam 0.5 MG tablet Commonly known as: Ativan Take 1 tablet (0.5 mg total) by mouth every 6 (six) hours as needed (Nausea or vomiting).   methylphenidate 10 MG tablet Commonly known as: RITALIN Take 1 tablet (10 mg total) by mouth 2 (two) times daily.   metoCLOPramide 10 MG tablet Commonly known as: REGLAN Take 1 tablet (10 mg total) by mouth every 6 (six) hours as needed for nausea or vomiting.   metoprolol tartrate 50 MG tablet Commonly known as: Lopressor Take 1 tablet (50 mg total) by mouth 2 (two) times daily.   multivitamin capsule Take 1 capsule by mouth daily.   OLANZapine 10 MG tablet Commonly known as: ZYPREXA TAKE 1 TABLET (10 MG TOTAL) BY MOUTH AT BEDTIME.   ondansetron 8 MG tablet Commonly known as: Zofran Take 1 tablet (8 mg total) by mouth 2 (two) times daily as needed for refractory nausea / vomiting. Start on day 3 after chemotherapy.   polyethylene glycol powder 17 GM/SCOOP powder Commonly known as: GLYCOLAX/MIRALAX Mix 17 grams (1 Capful) in 8 ounces of liquid and drink by  mouth daily as needed. Replaces: polyethylene glycol 17 g packet Started by: [name], MD   potassium chloride SA 20 MEQ tablet Commonly known as: KLOR-CON M Take 1 tablet (20 mEq total) by mouth daily.   predniSONE 50 MG tablet Commonly known as: DELTASONE TAKE 1 TABLET BY MOUTH 13 HOURS BEFORE CT SCAN, 1 TABLET 7 HOURS BEFORE CT SCAN, AND 1 TABLET 1 HOUR BEFORE CT SCAN   prochlorperazine 10 MG tablet Commonly known  as: COMPAZINE Take 1 tablet (10 mg total) by mouth every 6 (six) hours as needed (Nausea or vomiting).   promethazine 25 MG tablet Commonly known as: PHENERGAN TAKE 1 TABLET BY MOUTH EVERY 6 HOURS AS NEEDED FOR NAUSEA & VOMITING   senna-docusate 8.6-50 MG tablet Commonly known as: Senokot-S Take 1 tablet by mouth at bedtime as needed for mild constipation.   temazepam 30 MG capsule Commonly known as: RESTORIL Take 1 capsule (30 mg total) by mouth at bedtime as needed for sleep.   Xtampza ER 36 MG C12a Generic drug: oxyCODONE ER Take 1 capsule (36 mg total) by mouth every 12 (twelve) hours        Allergies:  Allergies  Allergen Reactions   Dextromethorphan-Guaifenesin Other (See Comments)    Irregular heartbeat    Doxycycline Anaphylaxis, Nausea And Vomiting, Rash and Other (See Comments)    "heart arrythmia" and "dyspepsia" (only oral doxycycline causes reaction)   Gadobutrol Hives and Other (See Comments)    Patient had MRI scan at [place]. Patient called one hour after he left imaging facility to report two "blisters" that came up on "back" of lip.     Gadolinium Derivatives Hives    Patient had MRI scan at [place]. Patient called one hour after he left imaging facility to report two "blisters" that came up on "back" of lip.    Guaifenesin Palpitations        Ibuprofen Hives and Itching   Lisinopril Other (See Comments)    Angioedema   Pantoprazole Itching   Tramadol Hives and Itching   Nexavar [Sorafenib] Hives    Barium Rash    Developed redness around neck after drinking 1st bottle of Barocat; pt was given Benedryl by ED Mds to be "on the safe side" before drinking 2nd bottle   Chlorhexidine Hives    Past Medical History, Surgical history, Social history, and Family History were reviewed and updated.  Review of Systems: Review of Systems  Constitutional:  Positive for malaise/fatigue.  HENT: Negative.    Eyes: Negative.   Respiratory: Negative.    Cardiovascular: Negative.   Gastrointestinal:  Positive for abdominal pain, constipation, heartburn and nausea.  Genitourinary: Negative.   Musculoskeletal:  Positive for joint pain.  Skin: Negative.   Neurological: Negative.   Endo/Heme/Allergies: Negative.   Psychiatric/Behavioral: Negative.      Physical Exam:  weight is 239 lb 1.9 oz (108.5 kg). His oral temperature is 98.2 F (36.8 C). His blood pressure is 116/92 (abnormal) and his pulse is 128 (abnormal). His respiration is 20 and oxygen saturation is 98%.   Wt Readings from Last 3 Encounters:  07/22/21 239 lb 1.9 oz (108.5 kg)  07/09/21 252 lb (114.3 kg)  07/01/21 245 lb (111.1 kg)  His vital signs are temperature of 98.7.  Pulse 114.  Blood pressure 129/73.  Weight is 257 pounds.  Physical Exam Vitals reviewed.  HENT:     Head: Normocephalic and atraumatic.  Eyes:     Pupils: Pupils are equal, round, and reactive to light.  Cardiovascular:     Heart sounds: Normal heart sounds.     Comments: Cardiac exam shows a tachycardic rate.  He has occasional extra beat.  I do not hear any obvious murmurs or rubs. Pulmonary:     Effort: Pulmonary effort is normal.     Breath sounds: Normal breath sounds.  Abdominal:     General: Bowel sounds are normal.     Palpations: Abdomen is soft.  Musculoskeletal:        General: No tenderness or deformity. Normal range of motion.     Cervical back: Normal range of motion.  Lymphadenopathy:     Cervical: No cervical adenopathy.  Skin:     General: Skin is warm and dry.     Findings: No erythema or rash.  Neurological:     Mental Status: He is alert and oriented to person, place, and time.  Psychiatric:        Behavior: Behavior normal.        Thought Content: Thought content normal.        Judgment: Judgment normal.     Lab Results  Component Value Date   WBC 4.4 07/22/2021   HGB 12.1 (L) 07/22/2021   HCT 40.0 07/22/2021   MCV 88.5 07/22/2021   PLT 244 07/22/2021   Lab Results  Component Value Date   FERRITIN 1,150 (H) 07/22/2021   IRON 78 07/22/2021   TIBC 305 07/22/2021   UIBC 227 07/22/2021   IRONPCTSAT 26 07/22/2021   Lab Results  Component Value Date   RETICCTPCT 3.9 (H) 08/08/2020   RBC 4.52 07/22/2021   No results found for: KPAFRELGTCHN, LAMBDASER, KAPLAMBRATIO No results found for: IGGSERUM, IGA, IGMSERUM No results found for: Odetta Pink, SPEI   Chemistry      Component Value Date/Time   NA 136 07/22/2021 0930   NA 141 02/06/2019 1210   K 4.2 07/22/2021 0930   CL 101 07/22/2021 0930   CO2 24 07/22/2021 0930   BUN 14 07/22/2021 0930   BUN 8 02/06/2019 1210   CREATININE 0.99 07/22/2021 0930      Component Value Date/Time   CALCIUM 9.7 07/22/2021 0930   ALKPHOS 203 (H) 07/22/2021 0930   AST 29 07/22/2021 0930   ALT 24 07/22/2021 0930   BILITOT 1.0 07/22/2021 0930       Impression and Plan: 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   Hopefully, the FOLFOX is helping.  His alkaline phosphatase is down.  This might be a good sign for [place].  We will go ahead with the second cycle of treatment today.  I will give him 4 cycles and then we can repeat his scans and see how everything looks.  I will plan to get him back in 2 more weeks.  [name], MD 6/6/20232:23 PM

## 2021-07-22 NOTE — Patient Instructions (Signed)

## 2021-07-23 ENCOUNTER — Other Ambulatory Visit: Payer: Self-pay | Admitting: Hematology & Oncology

## 2021-07-23 ENCOUNTER — Other Ambulatory Visit (HOSPITAL_BASED_OUTPATIENT_CLINIC_OR_DEPARTMENT_OTHER): Payer: Self-pay

## 2021-07-23 LAB — TESTOSTERONE: Testosterone: 402 ng/dL (ref 264–916)

## 2021-07-23 MED ORDER — METHYLPHENIDATE HCL 10 MG PO TABS
10.0000 mg | ORAL_TABLET | Freq: Two times a day (BID) | ORAL | 0 refills | Status: DC
  Filled 2021-07-23: qty 60, 30d supply, fill #0

## 2021-07-24 ENCOUNTER — Inpatient Hospital Stay: Payer: BC Managed Care – PPO

## 2021-07-24 ENCOUNTER — Other Ambulatory Visit: Payer: Self-pay | Admitting: Hematology & Oncology

## 2021-07-24 ENCOUNTER — Other Ambulatory Visit (HOSPITAL_BASED_OUTPATIENT_CLINIC_OR_DEPARTMENT_OTHER): Payer: Self-pay

## 2021-07-24 VITALS — BP 129/75 | HR 77 | Temp 97.9°F | Resp 17

## 2021-07-24 DIAGNOSIS — D509 Iron deficiency anemia, unspecified: Secondary | ICD-10-CM

## 2021-07-24 DIAGNOSIS — C73 Malignant neoplasm of thyroid gland: Secondary | ICD-10-CM

## 2021-07-24 DIAGNOSIS — E291 Testicular hypofunction: Secondary | ICD-10-CM

## 2021-07-24 MED ORDER — SODIUM CHLORIDE 0.9% FLUSH
10.0000 mL | INTRAVENOUS | Status: DC | PRN
  Administered 2021-07-24: 10 mL

## 2021-07-24 MED ORDER — HEPARIN SOD (PORK) LOCK FLUSH 100 UNIT/ML IV SOLN
500.0000 [IU] | Freq: Once | INTRAVENOUS | Status: AC | PRN
  Administered 2021-07-24: 500 [IU]

## 2021-07-24 MED ORDER — TESTOSTERONE CYPIONATE 200 MG/ML IM SOLN
400.0000 mg | INTRAMUSCULAR | Status: DC
  Administered 2021-07-24: 400 mg via INTRAMUSCULAR
  Filled 2021-07-24: qty 2

## 2021-07-24 MED ORDER — TAMSULOSIN HCL 0.4 MG PO CAPS
0.4000 mg | ORAL_CAPSULE | Freq: Every day | ORAL | 5 refills | Status: DC
Start: 2021-07-24 — End: 2022-08-01

## 2021-07-24 NOTE — Patient Instructions (Signed)

## 2021-08-04 ENCOUNTER — Other Ambulatory Visit (HOSPITAL_BASED_OUTPATIENT_CLINIC_OR_DEPARTMENT_OTHER): Payer: Self-pay

## 2021-08-04 ENCOUNTER — Other Ambulatory Visit: Payer: Self-pay | Admitting: Hematology & Oncology

## 2021-08-04 DIAGNOSIS — C73 Malignant neoplasm of thyroid gland: Secondary | ICD-10-CM

## 2021-08-04 MED ORDER — PROCHLORPERAZINE MALEATE 10 MG PO TABS
10.0000 mg | ORAL_TABLET | Freq: Four times a day (QID) | ORAL | 1 refills | Status: DC | PRN
  Filled 2021-08-04: qty 30, 8d supply, fill #0
  Filled 2021-09-12: qty 30, 8d supply, fill #1

## 2021-08-04 MED ORDER — METHYLPHENIDATE HCL 10 MG PO TABS
10.0000 mg | ORAL_TABLET | Freq: Two times a day (BID) | ORAL | 0 refills | Status: DC
  Filled 2021-08-04 – 2021-09-08 (×3): qty 60, 30d supply, fill #0

## 2021-08-05 ENCOUNTER — Inpatient Hospital Stay: Payer: BC Managed Care – PPO

## 2021-08-05 ENCOUNTER — Other Ambulatory Visit: Payer: Self-pay

## 2021-08-05 ENCOUNTER — Inpatient Hospital Stay: Payer: BC Managed Care – PPO | Admitting: Family

## 2021-08-05 ENCOUNTER — Encounter: Payer: Self-pay | Admitting: Family

## 2021-08-05 ENCOUNTER — Other Ambulatory Visit (HOSPITAL_BASED_OUTPATIENT_CLINIC_OR_DEPARTMENT_OTHER): Payer: Self-pay

## 2021-08-05 ENCOUNTER — Other Ambulatory Visit: Payer: Self-pay | Admitting: Oncology

## 2021-08-05 VITALS — BP 124/77 | HR 93 | Temp 98.0°F | Resp 18 | Ht 70.0 in | Wt 244.1 lb

## 2021-08-05 DIAGNOSIS — D509 Iron deficiency anemia, unspecified: Secondary | ICD-10-CM | POA: Diagnosis not present

## 2021-08-05 DIAGNOSIS — C73 Malignant neoplasm of thyroid gland: Secondary | ICD-10-CM

## 2021-08-05 LAB — CMP (CANCER CENTER ONLY)
ALT: 16 U/L (ref 0–44)
AST: 26 U/L (ref 15–41)
Albumin: 4.1 g/dL (ref 3.5–5.0)
Alkaline Phosphatase: 168 U/L — ABNORMAL HIGH (ref 38–126)
Anion gap: 9 (ref 5–15)
BUN: 17 mg/dL (ref 6–20)
CO2: 27 mmol/L (ref 22–32)
Calcium: 9.6 mg/dL (ref 8.9–10.3)
Chloride: 101 mmol/L (ref 98–111)
Creatinine: 1.09 mg/dL (ref 0.61–1.24)
GFR, Estimated: >60 mL/min (ref >60)
Glucose, Bld: 159 mg/dL — ABNORMAL HIGH (ref 70–99)
Potassium: 4.4 mmol/L (ref 3.5–5.1)
Sodium: 137 mmol/L (ref 135–145)
Total Bilirubin: 0.8 mg/dL (ref 0.3–1.2)
Total Protein: 6.7 g/dL (ref 6.5–8.1)

## 2021-08-05 LAB — CBC WITH DIFFERENTIAL (CANCER CENTER ONLY)
Abs Immature Granulocytes: 0.01 10*3/uL (ref 0.00–0.07)
Basophils Absolute: 0 10*3/uL (ref 0.0–0.1)
Basophils Relative: 1 %
Eosinophils Absolute: 0.1 10*3/uL (ref 0.0–0.5)
Eosinophils Relative: 2 %
HCT: 38.4 % — ABNORMAL LOW (ref 39.0–52.0)
Hemoglobin: 11.8 g/dL — ABNORMAL LOW (ref 13.0–17.0)
Immature Granulocytes: 0 %
Lymphocytes Relative: 7 %
Lymphs Abs: 0.3 10*3/uL — ABNORMAL LOW (ref 0.7–4.0)
MCH: 28 pg (ref 26.0–34.0)
MCHC: 30.7 g/dL (ref 30.0–36.0)
MCV: 91 fL (ref 80.0–100.0)
Monocytes Absolute: 0.5 10*3/uL (ref 0.1–1.0)
Monocytes Relative: 12 %
Neutro Abs: 3.3 10*3/uL (ref 1.7–7.7)
Neutrophils Relative %: 78 %
Platelet Count: 164 10*3/uL (ref 150–400)
RBC: 4.22 MIL/uL (ref 4.22–5.81)
RDW: 22.7 % — ABNORMAL HIGH (ref 11.5–15.5)
WBC Count: 4.3 10*3/uL (ref 4.0–10.5)
nRBC: 0 % (ref 0.0–0.2)

## 2021-08-05 LAB — IRON AND IRON BINDING CAPACITY (CC-WL,HP ONLY)
Iron: 96 ug/dL (ref 45–182)
Saturation Ratios: 30 % (ref 17.9–39.5)
TIBC: 325 ug/dL (ref 250–450)
UIBC: 229 ug/dL (ref 117–376)

## 2021-08-05 LAB — FERRITIN: Ferritin: 844 ng/mL — ABNORMAL HIGH (ref 24–336)

## 2021-08-05 LAB — LACTATE DEHYDROGENASE: LDH: 192 U/L (ref 98–192)

## 2021-08-05 LAB — TSH: TSH: 11.961 u[IU]/mL — ABNORMAL HIGH (ref 0.350–4.500)

## 2021-08-05 MED ORDER — PALONOSETRON HCL INJECTION 0.25 MG/5ML
0.2500 mg | Freq: Once | INTRAVENOUS | Status: AC
  Administered 2021-08-05: 0.25 mg via INTRAVENOUS
  Filled 2021-08-05: qty 5

## 2021-08-05 MED ORDER — LEUCOVORIN CALCIUM INJECTION 350 MG
400.0000 mg/m2 | Freq: Once | INTRAVENOUS | Status: AC
  Administered 2021-08-05: 936 mg via INTRAVENOUS
  Filled 2021-08-05: qty 46.8

## 2021-08-05 MED ORDER — SODIUM CHLORIDE 0.9 % IV SOLN
10.0000 mg | Freq: Once | INTRAVENOUS | Status: AC
  Administered 2021-08-05: 10 mg via INTRAVENOUS
  Filled 2021-08-05: qty 10

## 2021-08-05 MED ORDER — FLUOROURACIL CHEMO INJECTION 2.5 GM/50ML
400.0000 mg/m2 | Freq: Once | INTRAVENOUS | Status: AC
  Administered 2021-08-05: 950 mg via INTRAVENOUS
  Filled 2021-08-05: qty 19

## 2021-08-05 MED ORDER — DEXTROSE 5 % IV SOLN: Freq: Once | INTRAVENOUS | Status: AC

## 2021-08-05 MED ORDER — DEXTROSE 5 % IV SOLN: Freq: Once | INTRAVENOUS | Status: DC

## 2021-08-05 MED ORDER — OXALIPLATIN CHEMO INJECTION 100 MG/20ML
68.0000 mg/m2 | Freq: Once | INTRAVENOUS | Status: AC
  Administered 2021-08-05: 160 mg via INTRAVENOUS
  Filled 2021-08-05: qty 32

## 2021-08-05 MED ORDER — SODIUM CHLORIDE 0.9 % IV SOLN
1920.0000 mg/m2 | INTRAVENOUS | Status: DC
  Administered 2021-08-05: 4500 mg via INTRAVENOUS
  Filled 2021-08-05: qty 90

## 2021-08-05 NOTE — Progress Notes (Signed)
Hematology and Oncology Follow Up Visit  Brian [name] [id number] [birth year] 46 y.o. 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 + Bilateral pulmonary emboli without right heart strain  DVT left popliteal vein    Past Therapy: Cabometyx 40 mg po q day -- start on 04/17/2019 - d/c on 07/17/2019 for progression Lenvima 24 mg po q day -- on hold due to proteinuria Taxotere/CDDP -- s/p cycle 4 - started on 09/13/2019 Vinblastine q week x 4 weeks on / 1 week off -- s/p  cycle #2 -- start on 02/24/2021 -- d/c on 05/20/2021 Gavreto 400 mg po q day -- started on 05/17/2020  -- d/c on 06/16/2020 Adriamycin weekly 2 weeks on 1 week off - s/p cycle 8 --d/c on 04/16/2020 due to progression XRT for LEFT shoulder met - completed on 06/22/2021 Pembrolizumab 200 mg IV q 3 week -- s/p cycle #8 - start on 06/26/2020 -- d/c on 01/16/2021 due to progression   Current Therapy:     Zometa 4 mg IV q 2 months - next dose on 08/2021 Eliquis 5 mg po BID FOLFOX -- started 07/08/2021, s/p cycle 2   Interim History:  Brian [name] is here today for follow-up and treatment. He is felling a little more fatigue today.  He has occasional nausea. He takes antiemetics as needed which has been helpful.  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 He has sensitivity to cold for a week or so after each cycle. His kids have been good to help him with fixing his meals and fluids.  No fever, chills, vomiting, cough, rash, dizziness, SOB, chest pain, palpitations, abdominal pain or changes in bowel or bladder habits.  No bleeding, bruising or petechiae.  No swelling in his extremities.  No falls or syncope reported.  Appetite and hydration have been good. His weight is stable at 244 lbs.   ECOG Performance Status: 1 - Symptomatic but completely ambulatory  Medications:  Allergies as of 08/05/2021       Reactions   Dextromethorphan-guaifenesin Other  (See Comments)   Irregular heartbeat   Doxycycline Anaphylaxis, Nausea And Vomiting, Rash, Other (See Comments)   "heart arrythmia" and "dyspepsia" (only oral doxycycline causes reaction)   Gadobutrol Hives, Other (See Comments)   Patient had MRI scan at [place]. Patient called one hour after he left imaging facility to report two "blisters" that came up on "back" of lip.    Gadolinium Derivatives Hives   Patient had MRI scan at [place]. Patient called one hour after he left imaging facility to report two "blisters" that came up on "back" of lip.    Guaifenesin Palpitations      Ibuprofen Hives, Itching   Lisinopril Other (See Comments)   Angioedema   Pantoprazole Itching   Tramadol Hives, Itching   Nexavar [sorafenib] Hives   Barium Rash   Developed redness around neck after drinking 1st bottle of Barocat; pt was given Benedryl by ED Mds to be "on the safe side" before drinking 2nd bottle   Chlorhexidine Hives        Medication List        Accurate as of August 05, 2021 10:20 AM. If you have any questions, ask your nurse or doctor.          cyclobenzaprine 10 MG tablet Commonly known as: FLEXERIL Take 1 tablet (10 mg total) by mouth 3 (three) times daily as  needed.   dexamethasone 4 MG tablet Commonly known as: DECADRON Take 2 tablets (8 mg total) by mouth daily. Start the day after chemotherapy for 2 days. Take with food.   diphenhydrAMINE 25 mg capsule Commonly known as: BENADRYL TAKE 1 CAPSULE BY MOUTH 1 HOUR BEFORE CT SCAN   Eliquis 5 MG Tabs tablet Generic drug: apixaban TAKE 1 TABLET (5 MG TOTAL) BY MOUTH 2 (TWO) TIMES DAILY.   EPINEPHrine 0.3 mg/0.3 mL Soaj injection Commonly known as: EpiPen 2-Pak USE AS DIRECTED FOR LIFE THREATENING ALLERGIC REACTIONS   famotidine 40 MG tablet Commonly known as: PEPCID Take 1 tablet (40 mg total) by mouth 2 (two) times daily.   Fusion Plus Caps Take 1 capsule by mouth daily.   HYDROmorphone 8 MG  tablet Commonly known as: DILAUDID Take 1 tablet (8 mg total) by mouth every 4 (four) hours as needed for severe pain.   levothyroxine 200 MCG tablet Commonly known as: SYNTHROID Take one tablet (200 mcg dose) by mouth daily. Total:  288 mcg/day.   lidocaine-prilocaine cream Commonly known as: EMLA Apply to affected area once   LORazepam 1 MG tablet Commonly known as: ATIVAN Take 1 tablet (1 mg total) by mouth every 6 (six) hours as needed for anxiety.   LORazepam 0.5 MG tablet Commonly known as: Ativan Take 1 tablet (0.5 mg total) by mouth every 6 (six) hours as needed (Nausea or vomiting).   methylphenidate 10 MG tablet Commonly known as: RITALIN Take 1 tablet (10 mg total) by mouth 2 (two) times daily.   metoCLOPramide 10 MG tablet Commonly known as: REGLAN Take 1 tablet (10 mg total) by mouth every 6 (six) hours as needed for nausea or vomiting.   metoprolol tartrate 50 MG tablet Commonly known as: Lopressor Take 1 tablet (50 mg total) by mouth 2 (two) times daily.   multivitamin capsule Take 1 capsule by mouth daily.   OLANZapine 10 MG tablet Commonly known as: ZYPREXA TAKE 1 TABLET (10 MG TOTAL) BY MOUTH AT BEDTIME.   ondansetron 8 MG tablet Commonly known as: Zofran Take 1 tablet (8 mg total) by mouth 2 (two) times daily as needed for refractory nausea / vomiting. Start on day 3 after chemotherapy.   PEG 3350 17 GM/SCOOP Powd Mix 17 grams (1 Capful) in 8 ounces of liquid and drink by mouth daily as needed.   potassium chloride SA 20 MEQ tablet Commonly known as: KLOR-CON M Take 1 tablet (20 mEq total) by mouth daily.   predniSONE 50 MG tablet Commonly known as: DELTASONE TAKE 1 TABLET BY MOUTH 13 HOURS BEFORE CT SCAN, 1 TABLET 7 HOURS BEFORE CT SCAN, AND 1 TABLET 1 HOUR BEFORE CT SCAN   prochlorperazine 10 MG tablet Commonly known as: COMPAZINE Take 1 tablet (10 mg total) by mouth every 6 (six) hours as needed (Nausea or vomiting).   promethazine 25 MG  tablet Commonly known as: PHENERGAN TAKE 1 TABLET BY MOUTH EVERY 6 HOURS AS NEEDED FOR NAUSEA & VOMITING   senna-docusate 8.6-50 MG tablet Commonly known as: Senokot-S Take 1 tablet by mouth at bedtime as needed for mild constipation.   tamsulosin 0.4 MG Caps capsule Commonly known as: Flomax Take 1 capsule (0.4 mg total) by mouth daily after supper.   temazepam 30 MG capsule Commonly known as: RESTORIL Take 1 capsule (30 mg total) by mouth at bedtime as needed for sleep.   Xtampza ER 36 MG C12a Generic drug: oxyCODONE ER Take 1 capsule (36 mg total)  by mouth every 12 (twelve) hours        Allergies:  Allergies  Allergen Reactions   Dextromethorphan-Guaifenesin Other (See Comments)    Irregular heartbeat    Doxycycline Anaphylaxis, Nausea And Vomiting, Rash and Other (See Comments)    "heart arrythmia" and "dyspepsia" (only oral doxycycline causes reaction)   Gadobutrol Hives and Other (See Comments)    Patient had MRI scan at [place]. Patient called one hour after he left imaging facility to report two "blisters" that came up on "back" of lip.     Gadolinium Derivatives Hives    Patient had MRI scan at [place]. Patient called one hour after he left imaging facility to report two "blisters" that came up on "back" of lip.    Guaifenesin Palpitations        Ibuprofen Hives and Itching   Lisinopril Other (See Comments)    Angioedema   Pantoprazole Itching   Tramadol Hives and Itching   Nexavar [Sorafenib] Hives   Barium Rash    Developed redness around neck after drinking 1st bottle of Barocat; pt was given Benedryl by ED Mds to be "on the safe side" before drinking 2nd bottle   Chlorhexidine Hives    Past Medical History, Surgical history, Social history, and Family History were reviewed and updated.  Review of Systems: All other 10 point review of systems is negative.   Physical Exam:  height is 5' 10"  (1.778 m) and weight is 244 lb 1.3 oz  (110.7 kg). His oral temperature is 98 F (36.7 C). His blood pressure is 124/77 and his pulse is 93. His respiration is 18 and oxygen saturation is 100%.   Wt Readings from Last 3 Encounters:  08/05/21 244 lb 1.3 oz (110.7 kg)  07/22/21 239 lb 1.9 oz (108.5 kg)  07/09/21 252 lb (114.3 kg)    Ocular: Sclerae unicteric, pupils equal, round and reactive to light Ear-nose-throat: Oropharynx clear, dentition fair Lymphatic: No cervical or supraclavicular adenopathy Lungs no rales or rhonchi, good excursion bilaterally Heart regular rate and rhythm, no murmur appreciated Abd soft, nontender, positive bowel sounds MSK no focal spinal tenderness, no joint edema Neuro: non-focal, well-oriented, appropriate affect Breasts: Deferred   Lab Results  Component Value Date   WBC 4.3 08/05/2021   HGB 11.8 (L) 08/05/2021   HCT 38.4 (L) 08/05/2021   MCV 91.0 08/05/2021   PLT 164 08/05/2021   Lab Results  Component Value Date   FERRITIN 1,150 (H) 07/22/2021   IRON 78 07/22/2021   TIBC 305 07/22/2021   UIBC 227 07/22/2021   IRONPCTSAT 26 07/22/2021   Lab Results  Component Value Date   RETICCTPCT 3.9 (H) 08/08/2020   RBC 4.22 08/05/2021   No results found for: "KPAFRELGTCHN", "LAMBDASER", "KAPLAMBRATIO" No results found for: "IGGSERUM", "IGA", "IGMSERUM" No results found for: "TOTALPROTELP", "ALBUMINELP", "A1GS", "A2GS", "BETS", "BETA2SER", "GAMS", "MSPIKE", "SPEI"   Chemistry      Component Value Date/Time   NA 136 07/22/2021 0930   NA 141 02/06/2019 1210   K 4.2 07/22/2021 0930   CL 101 07/22/2021 0930   CO2 24 07/22/2021 0930   BUN 14 07/22/2021 0930   BUN 8 02/06/2019 1210   CREATININE 0.99 07/22/2021 0930      Component Value Date/Time   CALCIUM 9.7 07/22/2021 0930   ALKPHOS 203 (H) 07/22/2021 0930   AST 29 07/22/2021 0930   ALT 24 07/22/2021 0930   BILITOT 1.0 07/22/2021 0930       Impression  and Plan: Brian [name] is a very pleasant 46 yo caucasian gentleman with an  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  He states that overall he is tolerating the FOLFOX nicely.  We will proceed with cycle 3 today as planned.  Follow-up in 2 weeks.  We will repeat scans on him after cycle 4.  TSH and iron studies are pending.   Lottie Dawson, NP 6/20/202310:20 AM

## 2021-08-05 NOTE — Patient Instructions (Signed)

## 2021-08-05 NOTE — Patient Instructions (Addendum)
[facility] [place]  Discharge Instructions: Thank you for choosing [facility] to provide your oncology and hematology care.   If you have a lab appointment with the [facility], please go directly to the [facility] and check in at the registration area.  Wear comfortable clothing and clothing appropriate for easy access to any Portacath or PICC line.   We strive to give you quality time with your provider. You may need to reschedule your appointment if you arrive late (15 or more minutes).  Arriving late affects you and other patients whose appointments are after yours.  Also, if you miss three or more appointments without notifying the office, you may be dismissed from the clinic at the provider's discretion.      For prescription refill requests, have your pharmacy contact our office and allow 72 hours for refills to be completed.    Today you received the following chemotherapy and/or immunotherapy agents: Oxaliplatin, Leucovorin and 5FU.      To help prevent nausea and vomiting after your treatment, we encourage you to take your nausea medication as directed.  BELOW ARE SYMPTOMS THAT SHOULD BE REPORTED IMMEDIATELY: *FEVER GREATER THAN 100.4 F (38 C) OR HIGHER *CHILLS OR SWEATING *NAUSEA AND VOMITING THAT IS NOT CONTROLLED WITH YOUR NAUSEA MEDICATION *UNUSUAL SHORTNESS OF BREATH *UNUSUAL BRUISING OR BLEEDING *URINARY PROBLEMS (pain or burning when urinating, or frequent urination) *BOWEL PROBLEMS (unusual diarrhea, constipation, pain near the anus) TENDERNESS IN MOUTH AND THROAT WITH OR WITHOUT PRESENCE OF ULCERS (sore throat, sores in mouth, or a toothache) UNUSUAL RASH, SWELLING OR PAIN  UNUSUAL VAGINAL DISCHARGE OR ITCHING   Items with * indicate a potential emergency and should be followed up as soon as possible or go to the Emergency Department if any problems should occur.  Please show the CHEMOTHERAPY ALERT CARD or IMMUNOTHERAPY ALERT  CARD at check-in to the Emergency Department and triage nurse. Should you have questions after your visit or need to cancel or reschedule your appointment, please contact [facility]  [phone] and follow the prompts.  Office hours are 8:00 a.m. to 4:30 p.m. Monday - Friday. Please note that voicemails left after 4:00 p.m. may not be returned until the following business day.  We are closed weekends and major holidays. You have access to a nurse at all times for urgent questions. Please call the main number to the clinic [phone] and follow the prompts.  For any non-urgent questions, you may also contact your provider using MyChart. We now offer e-Visits for anyone 8 and older to request care online for non-urgent symptoms. For details visit [email or web address].   Also download the MyChart app! Go to the app store, search "MyChart", open the app, select , and log in with your MyChart username and password.  Masks are optional in the cancer centers. If you would like for your care team to wear a mask while they are taking care of you, please let them know. For doctor visits, patients may have with them one support person who is at least 46 years old. At this time, visitors are not allowed in the infusion area.[facility] [place]  Discharge Instructions: Thank you for choosing [facility] to provide your oncology and hematology care.   If you have a lab appointment with the [facility], please go directly to the [facility] and check in at the registration area.  Wear  comfortable clothing and clothing appropriate for easy access to any Portacath or PICC line.   We strive to give you quality time with your provider. You may need to reschedule your appointment if you arrive late (15 or more minutes).  Arriving late affects you and other patients whose appointments are after yours.  Also, if you miss three or more  appointments without notifying the office, you may be dismissed from the clinic at the provider's discretion.      For prescription refill requests, have your pharmacy contact our office and allow 72 hours for refills to be completed.    Today you received the following chemotherapy and/or immunotherapy agents 5-fu, leucovorin, Oxaliplatin     To help prevent nausea and vomiting after your treatment, we encourage you to take your nausea medication as directed.  BELOW ARE SYMPTOMS THAT SHOULD BE REPORTED IMMEDIATELY: *FEVER GREATER THAN 100.4 F (38 C) OR HIGHER *CHILLS OR SWEATING *NAUSEA AND VOMITING THAT IS NOT CONTROLLED WITH YOUR NAUSEA MEDICATION *UNUSUAL SHORTNESS OF BREATH *UNUSUAL BRUISING OR BLEEDING *URINARY PROBLEMS (pain or burning when urinating, or frequent urination) *BOWEL PROBLEMS (unusual diarrhea, constipation, pain near the anus) TENDERNESS IN MOUTH AND THROAT WITH OR WITHOUT PRESENCE OF ULCERS (sore throat, sores in mouth, or a toothache) UNUSUAL RASH, SWELLING OR PAIN  UNUSUAL VAGINAL DISCHARGE OR ITCHING   Items with * indicate a potential emergency and should be followed up as soon as possible or go to the Emergency Department if any problems should occur.  Please show the CHEMOTHERAPY ALERT CARD or IMMUNOTHERAPY ALERT CARD at check-in to the Emergency Department and triage nurse. Should you have questions after your visit or need to cancel or reschedule your appointment, please contact [facility]  [phone] and follow the prompts.  Office hours are 8:00 a.m. to 4:30 p.m. Monday - Friday. Please note that voicemails left after 4:00 p.m. may not be returned until the following business day.  We are closed weekends and major holidays. You have access to a nurse at all times for urgent questions. Please call the main number to the clinic [phone] and follow the prompts.  For any non-urgent questions, you may also contact your provider  using MyChart. We now offer e-Visits for anyone 9 and older to request care online for non-urgent symptoms. For details visit [email or web address].   Also download the MyChart app! Go to the app store, search "MyChart", open the app, select [facility], and log in with your MyChart username and password.  Masks are optional in the cancer centers. If you would like for your care team to wear a mask while they are taking care of you, please let them know. For doctor visits, patients may have with them one support person who is at least 46 years old. At this time, visitors are not allowed in the infusion area.

## 2021-08-07 ENCOUNTER — Inpatient Hospital Stay: Payer: BC Managed Care – PPO

## 2021-08-07 ENCOUNTER — Other Ambulatory Visit (HOSPITAL_BASED_OUTPATIENT_CLINIC_OR_DEPARTMENT_OTHER): Payer: Self-pay

## 2021-08-07 VITALS — BP 133/82 | HR 69 | Temp 98.8°F | Resp 18

## 2021-08-07 DIAGNOSIS — E291 Testicular hypofunction: Secondary | ICD-10-CM

## 2021-08-07 DIAGNOSIS — D509 Iron deficiency anemia, unspecified: Secondary | ICD-10-CM

## 2021-08-07 DIAGNOSIS — C73 Malignant neoplasm of thyroid gland: Secondary | ICD-10-CM

## 2021-08-07 MED ORDER — SODIUM CHLORIDE 0.9% FLUSH
10.0000 mL | Freq: Once | INTRAVENOUS | Status: AC
  Administered 2021-08-07: 10 mL via INTRAVENOUS

## 2021-08-07 MED ORDER — HEPARIN SOD (PORK) LOCK FLUSH 100 UNIT/ML IV SOLN
500.0000 [IU] | Freq: Once | INTRAVENOUS | Status: AC
  Administered 2021-08-07: 500 [IU] via INTRAVENOUS

## 2021-08-07 MED ORDER — TESTOSTERONE CYPIONATE 200 MG/ML IM SOLN
400.0000 mg | INTRAMUSCULAR | Status: DC
  Administered 2021-08-07: 400 mg via INTRAMUSCULAR
  Filled 2021-08-07: qty 2

## 2021-08-07 NOTE — Patient Instructions (Addendum)
Testosterone Injection What is this medication? TESTOSTERONE (tes TOS ter one) is used to increase testosterone levels in your body. It belongs to a group of medications called androgen hormones. This medicine may be used for other purposes; ask your health care provider or pharmacist if you have questions. COMMON BRAND NAME(S): Andro-L.A., Aveed, Delatestryl, Depo-Testosterone, Virilon What should I tell my care team before I take this medication? They need to know if you have any of these conditions: Cancer Diabetes Heart disease Kidney disease Liver disease Lung disease Prostate disease An unusual or allergic reaction to testosterone, other medications, foods, dyes, or preservatives If a male partner is pregnant or trying to get pregnant Breast-feeding How should I use this medication? This medication is for injection into a muscle. It is usually given in a hospital or clinic. A special MedGuide will be given to you by the pharmacist with each prescription and refill. Be sure to read this information carefully each time. Contact your care team regarding the use of this medication in children. While this medication may be prescribed for children as young as 36 years of age for selected conditions, precautions do apply. Overdosage: If you think you have taken too much of this medicine contact a poison control center or emergency room at once. NOTE: This medicine is only for you. Do not share this medicine with others. What if I miss a dose? Try not to miss a dose. Your care team will tell you when your next injection is due. Notify the office if you are unable to keep an appointment. What may interact with this medication? Medications for diabetes Medications that treat or prevent blood clots like warfarin Oxyphenbutazone Propranolol Steroid medications like prednisone or cortisone This list may not describe all possible interactions. Give your health care provider a list of all the  medicines, herbs, non-prescription drugs, or dietary supplements you use. Also tell them if you smoke, drink alcohol, or use illegal drugs. Some items may interact with your medicine. What should I watch for while using this medication? Visit your care team for regular checks on your progress. They will need to check the level of testosterone in your blood. This medication is only approved for use in men who have low levels of testosterone related to certain medical conditions. Heart attacks and strokes have been reported with the use of this medication. Notify your care team and seek emergency treatment if you develop breathing problems; changes in vision; confusion; chest pain or chest tightness; sudden arm pain; severe, sudden headache; trouble speaking or understanding; sudden numbness or weakness of the face, arm or leg; loss of balance or coordination. Talk to your care team about the risks and benefits of this medication. This medication may affect blood sugar levels. If you have diabetes, check with your care team before you change your diet or the dose of your diabetic medication. Testosterone injections are not commonly used in women. Women should inform their care team if they wish to become pregnant or think they might be pregnant. There is a potential for serious side effects to an unborn child. Talk to your care team or pharmacist for more information. Talk with your care team about your birth control options while taking this medication. This medication is banned from use in athletes by most athletic organizations. What side effects may I notice from receiving this medication? Side effects that you should report to your care team as soon as possible: Allergic reactions--skin rash, itching, hives, swelling of the  face, lips, tongue, or throat Blood clot--pain, swelling, or warmth in the leg, shortness of breath, chest pain Heart attack--pain or tightness in the chest, shoulders, arms or jaw,  nausea, shortness of breath, cold or clammy skin, feeling faint or lightheaded Increase in blood pressure Liver injury--right upper belly pain, loss of appetite, nausea, light-colored stool, dark yellow or brown urine, yellowing of the skin or eyes, unusual weakness or fatigue Mood swings, irritability, or hostility Prolonged or painful erection Sleep apnea--loud snoring, gasping during sleep, daytime sleepiness Stroke--sudden numbness or weakness of the face, arm or leg, trouble speaking, confusion, trouble walking, loss of balance or coordination, dizziness, severe headache, change in vision Swelling of the ankles, hands, or feet Thoughts of suicide or self-harm, worsening mood, feelings of depression Side effects that usually do not require medical attention (report to your care team if they continue or are bothersome): Acne Change in sex drive or performance Pain, redness, or irritation at the application site Unexpected breast tissue growth This list may not describe all possible side effects. Call your doctor for medical advice about side effects. You may report side effects to FDA at 1-800-FDA-1088. Where should I keep my medication? Keep out of the reach of children. This medication can be abused. Keep your medication in a safe place to protect it from theft. Do not share this medication with anyone. Selling or giving away this medication is dangerous and against the law. Store at room temperature between 20 and 25 degrees C (68 and 77 degrees F). Do not freeze. Protect from light. Follow the directions for the product you are prescribed. Throw away any unused medication after the expiration date. NOTE: This sheet is a summary. It may not cover all possible information. If you have questions about this medicine, talk to your doctor, pharmacist, or health care provider.  2023 Elsevier/Gold Standard (2020-01-31 00:00:00) Implanted Port Insertion, Care After The following information  offers guidance on how to care for yourself after your procedure. Your health care provider may also give you more specific instructions. If you have problems or questions, contact your health care provider. What can I expect after the procedure? After the procedure, it is common to have: Discomfort at the port insertion site. Bruising on the skin over the port. This should improve over 3-4 days. Follow these instructions at home: [facility] care After your port is placed, you will get a manufacturer's information card. The card has information about your port. Keep this card with you at all times. Take care of the port as told by your health care provider. Ask your health care provider if you or a family member can get training for taking care of the port at home. A home health care nurse will be be available to help care for the port. Make sure to remember what type of port you have. [facility] care     Follow instructions from your health care provider about how to take care of your port insertion site. Make sure you: Wash your hands with soap and water for at least 20 seconds before and after you change your bandage (dressing). If soap and water are not available, use hand sanitizer. Change your dressing as told by your health care provider. Leave stitches (sutures), skin glue, or adhesive strips in place. These skin closures may need to stay in place for 2 weeks or longer. If adhesive strip edges start to loosen and curl up, you may trim the loose edges. Do not remove adhesive  strips completely unless your health care provider tells you to do that. Check your port insertion site every day for signs of infection. Check for: Redness, swelling, or pain. Fluid or blood. Warmth. Pus or a bad smell. Activity Return to your normal activities as told by your health care provider. Ask your health care provider what activities are safe for you. You may have to avoid lifting. Ask your health care  provider how much you can safely lift. General instructions Take over-the-counter and prescription medicines only as told by your health care provider. Do not take baths, swim, or use a hot tub until your health care provider approves. Ask your health care provider if you may take showers. You may only be allowed to take sponge baths. If you were given a sedative during the procedure, it can affect you for several hours. Do not drive or operate machinery until your health care provider says that it is safe. Wear a medical alert bracelet in case of an emergency. This will tell any health care providers that you have a port. Keep all follow-up visits. This is important. Contact a health care provider if: You cannot flush your port with saline as directed, or you cannot draw blood from the port. You have a fever or chills. You have redness, swelling, or pain around your port insertion site. You have fluid or blood coming from your port insertion site. Your port insertion site feels warm to the touch. You have pus or a bad smell coming from the port insertion site. Get help right away if: You have chest pain or shortness of breath. You have bleeding from your port that you cannot control. These symptoms may be an emergency. Get help right away. Call 911. Do not wait to see if the symptoms will go away. Do not drive yourself to the hospital. Summary Take care of the port as told by your health care provider. Keep the manufacturer's information card with you at all times. Change your dressing as told by your health care provider. Contact a health care provider if you have a fever or chills or if you have redness, swelling, or pain around your port insertion site. Keep all follow-up visits. This information is not intended to replace advice given to you by your health care provider. Make sure you discuss any questions you have with your health care provider. Document Revised: 08/06/2020 Document  Reviewed: 08/06/2020 Elsevier Patient Education  [place].

## 2021-08-18 ENCOUNTER — Other Ambulatory Visit (HOSPITAL_BASED_OUTPATIENT_CLINIC_OR_DEPARTMENT_OTHER): Payer: Self-pay

## 2021-08-18 ENCOUNTER — Inpatient Hospital Stay: Payer: BC Managed Care – PPO

## 2021-08-18 ENCOUNTER — Inpatient Hospital Stay (HOSPITAL_BASED_OUTPATIENT_CLINIC_OR_DEPARTMENT_OTHER): Payer: BC Managed Care – PPO | Admitting: Hematology & Oncology

## 2021-08-18 ENCOUNTER — Encounter: Payer: Self-pay | Admitting: Hematology & Oncology

## 2021-08-18 ENCOUNTER — Inpatient Hospital Stay: Payer: BC Managed Care – PPO | Attending: Hematology & Oncology

## 2021-08-18 ENCOUNTER — Other Ambulatory Visit: Payer: Self-pay | Admitting: Hematology & Oncology

## 2021-08-18 VITALS — BP 117/83 | HR 102 | Temp 98.0°F | Resp 17 | Wt 245.0 lb

## 2021-08-18 VITALS — HR 90

## 2021-08-18 DIAGNOSIS — C787 Secondary malignant neoplasm of liver and intrahepatic bile duct: Secondary | ICD-10-CM | POA: Diagnosis present

## 2021-08-18 DIAGNOSIS — R110 Nausea: Secondary | ICD-10-CM

## 2021-08-18 DIAGNOSIS — C73 Malignant neoplasm of thyroid gland: Secondary | ICD-10-CM

## 2021-08-18 DIAGNOSIS — G893 Neoplasm related pain (acute) (chronic): Secondary | ICD-10-CM

## 2021-08-18 DIAGNOSIS — M546 Pain in thoracic spine: Secondary | ICD-10-CM

## 2021-08-18 DIAGNOSIS — Z5111 Encounter for antineoplastic chemotherapy: Secondary | ICD-10-CM | POA: Diagnosis present

## 2021-08-18 DIAGNOSIS — D509 Iron deficiency anemia, unspecified: Secondary | ICD-10-CM

## 2021-08-18 LAB — CBC WITH DIFFERENTIAL (CANCER CENTER ONLY)
Abs Immature Granulocytes: 0 10*3/uL (ref 0.00–0.07)
Basophils Absolute: 0 10*3/uL (ref 0.0–0.1)
Basophils Relative: 1 %
Eosinophils Absolute: 0.1 10*3/uL (ref 0.0–0.5)
Eosinophils Relative: 4 %
HCT: 39.7 % (ref 39.0–52.0)
Hemoglobin: 12.3 g/dL — ABNORMAL LOW (ref 13.0–17.0)
Immature Granulocytes: 0 %
Lymphocytes Relative: 14 %
Lymphs Abs: 0.5 10*3/uL — ABNORMAL LOW (ref 0.7–4.0)
MCH: 28.9 pg (ref 26.0–34.0)
MCHC: 31 g/dL (ref 30.0–36.0)
MCV: 93.4 fL (ref 80.0–100.0)
Monocytes Absolute: 0.5 10*3/uL (ref 0.1–1.0)
Monocytes Relative: 14 %
Neutro Abs: 2.2 10*3/uL (ref 1.7–7.7)
Neutrophils Relative %: 67 %
Platelet Count: 102 10*3/uL — ABNORMAL LOW (ref 150–400)
RBC: 4.25 MIL/uL (ref 4.22–5.81)
RDW: 23.9 % — ABNORMAL HIGH (ref 11.5–15.5)
WBC Count: 3.3 10*3/uL — ABNORMAL LOW (ref 4.0–10.5)
nRBC: 0 % (ref 0.0–0.2)

## 2021-08-18 LAB — IRON AND IRON BINDING CAPACITY (CC-WL,HP ONLY)
Iron: 108 ug/dL (ref 45–182)
Saturation Ratios: 31 % (ref 17.9–39.5)
TIBC: 349 ug/dL (ref 250–450)
UIBC: 241 ug/dL (ref 117–376)

## 2021-08-18 LAB — CMP (CANCER CENTER ONLY)
ALT: 21 U/L (ref 0–44)
AST: 28 U/L (ref 15–41)
Albumin: 4.3 g/dL (ref 3.5–5.0)
Alkaline Phosphatase: 166 U/L — ABNORMAL HIGH (ref 38–126)
Anion gap: 9 (ref 5–15)
BUN: 18 mg/dL (ref 6–20)
CO2: 27 mmol/L (ref 22–32)
Calcium: 10 mg/dL (ref 8.9–10.3)
Chloride: 100 mmol/L (ref 98–111)
Creatinine: 1.08 mg/dL (ref 0.61–1.24)
GFR, Estimated: >60 mL/min (ref >60)
Glucose, Bld: 111 mg/dL — ABNORMAL HIGH (ref 70–99)
Potassium: 4.5 mmol/L (ref 3.5–5.1)
Sodium: 136 mmol/L (ref 135–145)
Total Bilirubin: 1 mg/dL (ref 0.3–1.2)
Total Protein: 6.6 g/dL (ref 6.5–8.1)

## 2021-08-18 LAB — FERRITIN: Ferritin: 958 ng/mL — ABNORMAL HIGH (ref 24–336)

## 2021-08-18 LAB — TSH: TSH: 31.435 u[IU]/mL — ABNORMAL HIGH (ref 0.350–4.500)

## 2021-08-18 LAB — LACTATE DEHYDROGENASE: LDH: 212 U/L — ABNORMAL HIGH (ref 98–192)

## 2021-08-18 MED ORDER — DEXTROSE 5 % IV SOLN: Freq: Once | INTRAVENOUS | Status: DC

## 2021-08-18 MED ORDER — SODIUM CHLORIDE 0.9 % IV SOLN
10.0000 mg | Freq: Once | INTRAVENOUS | Status: AC
  Administered 2021-08-18: 10 mg via INTRAVENOUS
  Filled 2021-08-18: qty 1

## 2021-08-18 MED ORDER — HEPARIN SOD (PORK) LOCK FLUSH 100 UNIT/ML IV SOLN: 500.0000 [IU] | Freq: Once | INTRAVENOUS | Status: DC | PRN

## 2021-08-18 MED ORDER — OXALIPLATIN CHEMO INJECTION 100 MG/20ML
68.0000 mg/m2 | Freq: Once | INTRAVENOUS | Status: AC
  Administered 2021-08-18: 160 mg via INTRAVENOUS
  Filled 2021-08-18: qty 32

## 2021-08-18 MED ORDER — PROMETHAZINE HCL 25 MG PO TABS
ORAL_TABLET | ORAL | 0 refills | Status: DC
  Filled 2021-08-18: qty 120, 30d supply, fill #0

## 2021-08-18 MED ORDER — SODIUM CHLORIDE 0.9 % IV SOLN
1920.0000 mg/m2 | INTRAVENOUS | Status: DC
  Administered 2021-08-18: 4500 mg via INTRAVENOUS
  Filled 2021-08-18: qty 90

## 2021-08-18 MED ORDER — DEXTROSE 5 % IV SOLN: Freq: Once | INTRAVENOUS | Status: AC

## 2021-08-18 MED ORDER — SODIUM CHLORIDE 0.9% FLUSH: 10.0000 mL | INTRAVENOUS | Status: DC | PRN

## 2021-08-18 MED ORDER — FLUOROURACIL CHEMO INJECTION 2.5 GM/50ML
400.0000 mg/m2 | Freq: Once | INTRAVENOUS | Status: AC
  Administered 2021-08-18: 950 mg via INTRAVENOUS
  Filled 2021-08-18: qty 11

## 2021-08-18 MED ORDER — PALONOSETRON HCL INJECTION 0.25 MG/5ML
0.2500 mg | Freq: Once | INTRAVENOUS | Status: AC
  Administered 2021-08-18: 0.25 mg via INTRAVENOUS
  Filled 2021-08-18: qty 5

## 2021-08-18 MED ORDER — XTAMPZA ER 36 MG PO C12A
36.0000 mg | EXTENDED_RELEASE_CAPSULE | Freq: Two times a day (BID) | ORAL | 0 refills | Status: DC
  Filled 2021-08-18: qty 60, 30d supply, fill #0

## 2021-08-18 MED ORDER — LEUCOVORIN CALCIUM INJECTION 350 MG
400.0000 mg/m2 | Freq: Once | INTRAVENOUS | Status: AC
  Administered 2021-08-18: 936 mg via INTRAVENOUS
  Filled 2021-08-18: qty 46.8

## 2021-08-18 MED ORDER — HYDROMORPHONE HCL 8 MG PO TABS
8.0000 mg | ORAL_TABLET | ORAL | 0 refills | Status: DC | PRN
  Filled 2021-08-18: qty 120, 20d supply, fill #0

## 2021-08-18 NOTE — Patient Instructions (Signed)
Fluorouracil, 5-FU injection What is this medication? FLUOROURACIL, 5-FU (flure oh YOOR a sil) is a chemotherapy drug. It slows the growth of cancer cells. This medicine is used to treat many types of cancer like breast cancer, colon or rectal cancer, pancreatic cancer, and stomach cancer. This medicine may be used for other purposes; ask your health care provider or pharmacist if you have questions. COMMON BRAND NAME(S): Adrucil What should I tell my care team before I take this medication? They need to know if you have any of these conditions: blood disorders dihydropyrimidine dehydrogenase (DPD) deficiency infection (especially a virus infection such as chickenpox, cold sores, or herpes) kidney disease liver disease malnourished, poor nutrition recent or ongoing radiation therapy an unusual or allergic reaction to fluorouracil, other chemotherapy, other medicines, foods, dyes, or preservatives pregnant or trying to get pregnant breast-feeding How should I use this medication? This drug is given as an infusion or injection into a vein. It is administered in a hospital or clinic by a specially trained health care professional. Talk to your pediatrician regarding the use of this medicine in children. Special care may be needed. Overdosage: If you think you have taken too much of this medicine contact a poison control center or emergency room at once. NOTE: This medicine is only for you. Do not share this medicine with others. What if I miss a dose? It is important not to miss your dose. Call your doctor or health care professional if you are unable to keep an appointment. What may interact with this medication? Do not take this medicine with any of the following medications: live virus vaccines This medicine may also interact with the following medications: medicines that treat or prevent blood clots like warfarin, enoxaparin, and dalteparin This list may not describe all possible  interactions. Give your health care provider a list of all the medicines, herbs, non-prescription drugs, or dietary supplements you use. Also tell them if you smoke, drink alcohol, or use illegal drugs. Some items may interact with your medicine. What should I watch for while using this medication? Visit your doctor for checks on your progress. This drug may make you feel generally unwell. This is not uncommon, as chemotherapy can affect healthy cells as well as cancer cells. Report any side effects. Continue your course of treatment even though you feel ill unless your doctor tells you to stop. In some cases, you may be given additional medicines to help with side effects. Follow all directions for their use. Call your doctor or health care professional for advice if you get a fever, chills or sore throat, or other symptoms of a cold or flu. Do not treat yourself. This drug decreases your body's ability to fight infections. Try to avoid being around people who are sick. This medicine may increase your risk to bruise or bleed. Call your doctor or health care professional if you notice any unusual bleeding. Be careful brushing and flossing your teeth or using a toothpick because you may get an infection or bleed more easily. If you have any dental work done, tell your dentist you are receiving this medicine. Avoid taking products that contain aspirin, acetaminophen, ibuprofen, naproxen, or ketoprofen unless instructed by your doctor. These medicines may hide a fever. Do not become pregnant while taking this medicine. Women should inform their doctor if they wish to become pregnant or think they might be pregnant. There is a potential for serious side effects to an unborn child. Talk to your health care  professional or pharmacist for more information. Do not breast-feed an infant while taking this medicine. Men should inform their doctor if they wish to father a child. This medicine may lower sperm  counts. Do not treat diarrhea with over the counter products. Contact your doctor if you have diarrhea that lasts more than 2 days or if it is severe and watery. This medicine can make you more sensitive to the sun. Keep out of the sun. If you cannot avoid being in the sun, wear protective clothing and use sunscreen. Do not use sun lamps or tanning beds/booths. What side effects may I notice from receiving this medication? Side effects that you should report to your doctor or health care professional as soon as possible: allergic reactions like skin rash, itching or hives, swelling of the face, lips, or tongue low blood counts - this medicine may decrease the number of white blood cells, red blood cells and platelets. You may be at increased risk for infections and bleeding. signs of infection - fever or chills, cough, sore throat, pain or difficulty passing urine signs of decreased platelets or bleeding - bruising, pinpoint red spots on the skin, black, tarry stools, blood in the urine signs of decreased red blood cells - unusually weak or tired, fainting spells, lightheadedness breathing problems changes in vision chest pain mouth sores nausea and vomiting pain, swelling, redness at site where injected pain, tingling, numbness in the hands or feet redness, swelling, or sores on hands or feet stomach pain unusual bleeding Side effects that usually do not require medical attention (report to your doctor or health care professional if they continue or are bothersome): changes in finger or toe nails diarrhea dry or itchy skin hair loss headache loss of appetite sensitivity of eyes to the light stomach upset unusually teary eyes This list may not describe all possible side effects. Call your doctor for medical advice about side effects. You may report side effects to FDA at 1-800-FDA-1088. Where should I keep my medication? This drug is given in a hospital or clinic and will not be  stored at home. NOTE: This sheet is a summary. It may not cover all possible information. If you have questions about this medicine, talk to your doctor, pharmacist, or health care provider.  2023 [employer] (2021-01-03 00:00:00)  Leucovorin injection What is this medication? LEUCOVORIN (loo koe VOR in) is used to prevent or treat the harmful effects of some medicines. This medicine is used to treat anemia caused by a low amount of folic acid in the body. It is also used with 5-fluorouracil (5-FU) to treat colon cancer. This medicine may be used for other purposes; ask your health care provider or pharmacist if you have questions. What should I tell my care team before I take this medication? They need to know if you have any of these conditions: anemia from low levels of vitamin B-12 in the blood an unusual or allergic reaction to leucovorin, folic acid, other medicines, foods, dyes, or preservatives pregnant or trying to get pregnant breast-feeding How should I use this medication? This medicine is for injection into a muscle or into a vein. It is given by a health care professional in a hospital or clinic setting. Talk to your pediatrician regarding the use of this medicine in children. Special care may be needed. Overdosage: If you think you have taken too much of this medicine contact a poison control center or emergency room at once. NOTE: This medicine is only for  you. Do not share this medicine with others. What if I miss a dose? This does not apply. What may interact with this medication? capecitabine fluorouracil phenobarbital phenytoin primidone trimethoprim-sulfamethoxazole This list may not describe all possible interactions. Give your health care provider a list of all the medicines, herbs, non-prescription drugs, or dietary supplements you use. Also tell them if you smoke, drink alcohol, or use illegal drugs. Some items may interact with your medicine. What  should I watch for while using this medication? Your condition will be monitored carefully while you are receiving this medicine. This medicine may increase the side effects of 5-fluorouracil, 5-FU. Tell your doctor or health care professional if you have diarrhea or mouth sores that do not get better or that get worse. What side effects may I notice from receiving this medication? Side effects that you should report to your doctor or health care professional as soon as possible: allergic reactions like skin rash, itching or hives, swelling of the face, lips, or tongue breathing problems fever, infection mouth sores unusual bleeding or bruising unusually weak or tired Side effects that usually do not require medical attention (report to your doctor or health care professional if they continue or are bothersome): constipation or diarrhea loss of appetite nausea, vomiting This list may not describe all possible side effects. Call your doctor for medical advice about side effects. You may report side effects to FDA at 1-800-FDA-1088. Where should I keep my medication? This drug is given in a hospital or clinic and will not be stored at home. NOTE: This sheet is a summary. It may not cover all possible information. If you have questions about this medicine, talk to your doctor, pharmacist, or health care provider.  2023 [employer] (2007-08-11 00:00:00)  Oxaliplatin Injection What is this medication? OXALIPLATIN (ox AL i PLA tin) is a chemotherapy drug. It targets fast dividing cells, like cancer cells, and causes these cells to die. This medicine is used to treat cancers of the colon and rectum, and many other cancers. This medicine may be used for other purposes; ask your health care provider or pharmacist if you have questions. COMMON BRAND NAME(S): Eloxatin What should I tell my care team before I take this medication? They need to know if you have any of these  conditions: heart disease history of irregular heartbeat liver disease low blood counts, like white cells, platelets, or red blood cells lung or breathing disease, like asthma take medicines that treat or prevent blood clots tingling of the fingers or toes, or other nerve disorder an unusual or allergic reaction to oxaliplatin, other chemotherapy, other medicines, foods, dyes, or preservatives pregnant or trying to get pregnant breast-feeding How should I use this medication? This drug is given as an infusion into a vein. It is administered in a hospital or clinic by a specially trained health care professional. Talk to your pediatrician regarding the use of this medicine in children. Special care may be needed. Overdosage: If you think you have taken too much of this medicine contact a poison control center or emergency room at once. NOTE: This medicine is only for you. Do not share this medicine with others. What if I miss a dose? It is important not to miss a dose. Call your doctor or health care professional if you are unable to keep an appointment. What may interact with this medication? Do not take this medicine with any of the following medications: cisapride dronedarone pimozide thioridazine This medicine may also interact  with the following medications: aspirin and aspirin-like medicines certain medicines that treat or prevent blood clots like warfarin, apixaban, dabigatran, and rivaroxaban cisplatin cyclosporine diuretics medicines for infection like acyclovir, adefovir, amphotericin B, bacitracin, cidofovir, foscarnet, ganciclovir, gentamicin, pentamidine, vancomycin NSAIDs, medicines for pain and inflammation, like ibuprofen or naproxen other medicines that prolong the QT interval (an abnormal heart rhythm) pamidronate zoledronic acid This list may not describe all possible interactions. Give your health care provider a list of all the medicines, herbs, non-prescription  drugs, or dietary supplements you use. Also tell them if you smoke, drink alcohol, or use illegal drugs. Some items may interact with your medicine. What should I watch for while using this medication? Your condition will be monitored carefully while you are receiving this medicine. You may need blood work done while you are taking this medicine. This medicine may make you feel generally unwell. This is not uncommon as chemotherapy can affect healthy cells as well as cancer cells. Report any side effects. Continue your course of treatment even though you feel ill unless your healthcare professional tells you to stop. This medicine can make you more sensitive to cold. Do not drink cold drinks or use ice. Cover exposed skin before coming in contact with cold temperatures or cold objects. When out in cold weather wear warm clothing and cover your mouth and nose to warm the air that goes into your lungs. Tell your doctor if you get sensitive to the cold. Do not become pregnant while taking this medicine or for 9 months after stopping it. Women should inform their health care professional if they wish to become pregnant or think they might be pregnant. Men should not father a child while taking this medicine and for 6 months after stopping it. There is potential for serious side effects to an unborn child. Talk to your health care professional for more information. Do not breast-feed a child while taking this medicine or for 3 months after stopping it. This medicine has caused ovarian failure in some women. This medicine may make it more difficult to get pregnant. Talk to your health care professional if you are concerned about your fertility. This medicine has caused decreased sperm counts in some men. This may make it more difficult to father a child. Talk to your health care professional if you are concerned about your fertility. This medicine may increase your risk of getting an infection. Call your health  care professional for advice if you get a fever, chills, or sore throat, or other symptoms of a cold or flu. Do not treat yourself. Try to avoid being around people who are sick. Avoid taking medicines that contain aspirin, acetaminophen, ibuprofen, naproxen, or ketoprofen unless instructed by your health care professional. These medicines may hide a fever. Be careful brushing or flossing your teeth or using a toothpick because you may get an infection or bleed more easily. If you have any dental work done, tell your dentist you are receiving this medicine. What side effects may I notice from receiving this medication? Side effects that you should report to your doctor or health care professional as soon as possible: allergic reactions like skin rash, itching or hives, swelling of the face, lips, or tongue breathing problems cough low blood counts - this medicine may decrease the number of white blood cells, red blood cells, and platelets. You may be at increased risk for infections and bleeding nausea, vomiting pain, redness, or irritation at site where injected pain, tingling, numbness in  the hands or feet signs and symptoms of bleeding such as bloody or black, tarry stools; red or dark brown urine; spitting up blood or brown material that looks like coffee grounds; red spots on the skin; unusual bruising or bleeding from the eyes, gums, or nose signs and symptoms of a dangerous change in heartbeat or heart rhythm like chest pain; dizziness; fast, irregular heartbeat; palpitations; feeling faint or lightheaded; falls signs and symptoms of infection like fever; chills; cough; sore throat; pain or trouble passing urine signs and symptoms of liver injury like dark yellow or brown urine; general ill feeling or flu-like symptoms; light-colored stools; loss of appetite; nausea; right upper belly pain; unusually weak or tired; yellowing of the eyes or skin signs and symptoms of low red blood cells or  anemia such as unusually weak or tired; feeling faint or lightheaded; falls signs and symptoms of muscle injury like dark urine; trouble passing urine or change in the amount of urine; unusually weak or tired; muscle pain; back pain Side effects that usually do not require medical attention (report to your doctor or health care professional if they continue or are bothersome): changes in taste diarrhea gas hair loss loss of appetite mouth sores This list may not describe all possible side effects. Call your doctor for medical advice about side effects. You may report side effects to FDA at 1-800-FDA-1088. Where should I keep my medication? This drug is given in a hospital or clinic and will not be stored at home. NOTE: This sheet is a summary. It may not cover all possible information. If you have questions about this medicine, talk to your doctor, pharmacist, or health care provider.  2023 [employer] (2021-01-03 00:00:00)

## 2021-08-18 NOTE — Patient Instructions (Signed)

## 2021-08-18 NOTE — Progress Notes (Signed)
Hematology and Oncology Follow Up Visit  Brian [name] [id number] [birth year] 46 y.o. 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 + Bilateral pulmonary emboli without right heart strain  DVT left popliteal vein    Past Therapy: Cabometyx 40 mg po q day -- start on 04/17/2019 - d/c on 07/17/2019 for progression Lenvima 24 mg po q day -- on hold due to proteinuria Taxotere/CDDP -- s/p cycle 4 - started on 09/13/2019   Current Therapy:      Vinblastine q week x 4 weeks on / 1 week off -- s/p  cycle #2 -- start on 02/24/2021 -- d/c on 05/20/2021 Gavreto 400 mg po q day -- started on 05/17/2020  -- d/c on 06/16/2020 Adriamycin weekly 2 weeks on 1 week off - s/p cycle 8 --d/c on 04/16/2020 due to progression XRT for LEFT shoulder met -completed on 06/22/2021 Pembrolizumab 200 mg IV q 3 week -- s/p cycle #8 - start on 06/26/2020 -- d/c on 01/16/2021 due to progression Zometa 4 mg IV q 2 months - next dose on 08/2021 Eliquis 5 mg po BID FOLFOX -- s/p cycle #3 -- start on 07/08/2021   Interim History:  Brian [name] is here today for follow-up.  He actually looks quite good.  He had a good weekend.  It sounds like he will be busy on July 4.  I have to believe that the FOLFOX is, at least, keeping his disease stable.  He says he is not having as much pain.  He does not feel as tired.  We have not had to transfuse him for a while.  He has had no problems with bowels or bladder.  He may have little bit more nausea.  He is on Eliquis.  This is for the history of pulmonary embolism.  He is doing well with the Eliquis without any bleeding.  He has had a little bit of leg swelling but not all that bad.  There is no cough.  He has had no shortness of breath.  Has had no headache.  I am just glad that his quality of life is doing pretty well right now.  He really has done a good job with the FOLFOX.  Overall, I would say his performance  status right now is probably ECOG 1.     Medications:  Allergies as of 08/18/2021       Reactions   Dextromethorphan-guaifenesin Other (See Comments)   Irregular heartbeat   Doxycycline Anaphylaxis, Nausea And Vomiting, Rash, Other (See Comments)   "heart arrythmia" and "dyspepsia" (only oral doxycycline causes reaction)   Gadobutrol Hives, Other (See Comments)   Patient had MRI scan at [place]. Patient called one hour after he left imaging facility to report two "blisters" that came up on "back" of lip.    Gadolinium Derivatives Hives   Patient had MRI scan at [place]. Patient called one hour after he left imaging facility to report two "blisters" that came up on "back" of lip.    Guaifenesin Palpitations      Ibuprofen Hives, Itching   Lisinopril Other (See Comments)   Angioedema   Pantoprazole Itching   Tramadol Hives, Itching   Nexavar [sorafenib] Hives   Barium Rash   Developed redness around neck after drinking 1st bottle of Barocat; pt was given Benedryl by ED Mds to be "on the safe side" before drinking 2nd bottle   Chlorhexidine Hives  Medication List        Accurate as of August 18, 2021 10:36 AM. If you have any questions, ask your nurse or doctor.          cyclobenzaprine 10 MG tablet Commonly known as: FLEXERIL Take 1 tablet (10 mg total) by mouth 3 (three) times daily as needed.   dexamethasone 4 MG tablet Commonly known as: DECADRON Take 2 tablets (8 mg total) by mouth daily. Start the day after chemotherapy for 2 days. Take with food.   diphenhydrAMINE 25 mg capsule Commonly known as: BENADRYL TAKE 1 CAPSULE BY MOUTH 1 HOUR BEFORE CT SCAN   Eliquis 5 MG Tabs tablet Generic drug: apixaban TAKE 1 TABLET (5 MG TOTAL) BY MOUTH 2 (TWO) TIMES DAILY.   EPINEPHrine 0.3 mg/0.3 mL Soaj injection Commonly known as: EpiPen 2-Pak USE AS DIRECTED FOR LIFE THREATENING ALLERGIC REACTIONS   famotidine 40 MG tablet Commonly known as:  PEPCID Take 1 tablet (40 mg total) by mouth 2 (two) times daily.   Fusion Plus Caps Take 1 capsule by mouth daily.   HYDROmorphone 8 MG tablet Commonly known as: DILAUDID Take 1 tablet (8 mg total) by mouth every 4 (four) hours as needed for severe pain.   levothyroxine 200 MCG tablet Commonly known as: SYNTHROID Take one tablet (200 mcg dose) by mouth daily. Total:  288 mcg/day.   lidocaine-prilocaine cream Commonly known as: EMLA Apply to affected area once   LORazepam 1 MG tablet Commonly known as: ATIVAN Take 1 tablet (1 mg total) by mouth every 6 (six) hours as needed for anxiety.   LORazepam 0.5 MG tablet Commonly known as: Ativan Take 1 tablet (0.5 mg total) by mouth every 6 (six) hours as needed (Nausea or vomiting).   methylphenidate 10 MG tablet Commonly known as: RITALIN Take 1 tablet (10 mg total) by mouth 2 (two) times daily.   metoCLOPramide 10 MG tablet Commonly known as: REGLAN Take 1 tablet (10 mg total) by mouth every 6 (six) hours as needed for nausea or vomiting.   metoprolol tartrate 50 MG tablet Commonly known as: Lopressor Take 1 tablet (50 mg total) by mouth 2 (two) times daily.   multivitamin capsule Take 1 capsule by mouth daily.   OLANZapine 10 MG tablet Commonly known as: ZYPREXA TAKE 1 TABLET (10 MG TOTAL) BY MOUTH AT BEDTIME.   ondansetron 8 MG tablet Commonly known as: Zofran Take 1 tablet (8 mg total) by mouth 2 (two) times daily as needed for refractory nausea / vomiting. Start on day 3 after chemotherapy.   PEG 3350 17 GM/SCOOP Powd Mix 17 grams (1 Capful) in 8 ounces of liquid and drink by mouth daily as needed.   potassium chloride SA 20 MEQ tablet Commonly known as: KLOR-CON M Take 1 tablet (20 mEq total) by mouth daily.   predniSONE 50 MG tablet Commonly known as: DELTASONE TAKE 1 TABLET BY MOUTH 13 HOURS BEFORE CT SCAN, 1 TABLET 7 HOURS BEFORE CT SCAN, AND 1 TABLET 1 HOUR BEFORE CT SCAN   prochlorperazine 10 MG  tablet Commonly known as: COMPAZINE Take 1 tablet (10 mg total) by mouth every 6 (six) hours as needed (Nausea or vomiting).   promethazine 25 MG tablet Commonly known as: PHENERGAN TAKE 1 TABLET BY MOUTH EVERY 6 HOURS AS NEEDED FOR NAUSEA & VOMITING   senna-docusate 8.6-50 MG tablet Commonly known as: Senokot-S Take 1 tablet by mouth at bedtime as needed for mild constipation.   tamsulosin 0.4 MG Caps  capsule Commonly known as: Flomax Take 1 capsule (0.4 mg total) by mouth daily after supper.   temazepam 30 MG capsule Commonly known as: RESTORIL Take 1 capsule (30 mg total) by mouth at bedtime as needed for sleep.   Xtampza ER 36 MG C12a Generic drug: oxyCODONE ER Take 1 capsule (36 mg total) by mouth every 12 (twelve) hours        Allergies:  Allergies  Allergen Reactions   Dextromethorphan-Guaifenesin Other (See Comments)    Irregular heartbeat    Doxycycline Anaphylaxis, Nausea And Vomiting, Rash and Other (See Comments)    "heart arrythmia" and "dyspepsia" (only oral doxycycline causes reaction)   Gadobutrol Hives and Other (See Comments)    Patient had MRI scan at [place]. Patient called one hour after he left imaging facility to report two "blisters" that came up on "back" of lip.     Gadolinium Derivatives Hives    Patient had MRI scan at [place]. Patient called one hour after he left imaging facility to report two "blisters" that came up on "back" of lip.    Guaifenesin Palpitations        Ibuprofen Hives and Itching   Lisinopril Other (See Comments)    Angioedema   Pantoprazole Itching   Tramadol Hives and Itching   Nexavar [Sorafenib] Hives   Barium Rash    Developed redness around neck after drinking 1st bottle of Barocat; pt was given Benedryl by ED Mds to be "on the safe side" before drinking 2nd bottle   Chlorhexidine Hives    Past Medical History, Surgical history, Social history, and Family History were reviewed and  updated.  Review of Systems: Review of Systems  Constitutional:  Positive for malaise/fatigue.  HENT: Negative.    Eyes: Negative.   Respiratory: Negative.    Cardiovascular: Negative.   Gastrointestinal:  Positive for abdominal pain, constipation, heartburn and nausea.  Genitourinary: Negative.   Musculoskeletal:  Positive for joint pain.  Skin: Negative.   Neurological: Negative.   Endo/Heme/Allergies: Negative.   Psychiatric/Behavioral: Negative.       Physical Exam:  weight is 245 lb (111.1 kg). His oral temperature is 98 F (36.7 C). His blood pressure is 117/83 and his pulse is 102 (abnormal). His respiration is 17 and oxygen saturation is 98%.   Wt Readings from Last 3 Encounters:  08/18/21 245 lb (111.1 kg)  08/05/21 244 lb 1.3 oz (110.7 kg)  07/22/21 239 lb 1.9 oz (108.5 kg)  His vital signs are temperature of 98.7.  Pulse 114.  Blood pressure 129/73.  Weight is 257 pounds.  Physical Exam Vitals reviewed.  HENT:     Head: Normocephalic and atraumatic.  Eyes:     Pupils: Pupils are equal, round, and reactive to light.  Cardiovascular:     Heart sounds: Normal heart sounds.     Comments: Cardiac exam shows a tachycardic rate.  He has occasional extra beat.  I do not hear any obvious murmurs or rubs. Pulmonary:     Effort: Pulmonary effort is normal.     Breath sounds: Normal breath sounds.  Abdominal:     General: Bowel sounds are normal.     Palpations: Abdomen is soft.  Musculoskeletal:        General: No tenderness or deformity. Normal range of motion.     Cervical back: Normal range of motion.  Lymphadenopathy:     Cervical: No cervical adenopathy.  Skin:    General: Skin is warm and dry.  Findings: No erythema or rash.  Neurological:     Mental Status: He is alert and oriented to person, place, and time.  Psychiatric:        Behavior: Behavior normal.        Thought Content: Thought content normal.        Judgment: Judgment normal.      Lab  Results  Component Value Date   WBC 3.3 (L) 08/18/2021   HGB 12.3 (L) 08/18/2021   HCT 39.7 08/18/2021   MCV 93.4 08/18/2021   PLT 102 (L) 08/18/2021   Lab Results  Component Value Date   FERRITIN 844 (H) 08/05/2021   IRON 96 08/05/2021   TIBC 325 08/05/2021   UIBC 229 08/05/2021   IRONPCTSAT 30 08/05/2021   Lab Results  Component Value Date   RETICCTPCT 3.9 (H) 08/08/2020   RBC 4.25 08/18/2021   No results found for: "KPAFRELGTCHN", "LAMBDASER", "KAPLAMBRATIO" No results found for: "IGGSERUM", "IGA", "IGMSERUM" No results found for: "TOTALPROTELP", "ALBUMINELP", "A1GS", "A2GS", "BETS", "BETA2SER", "GAMS", "MSPIKE", "SPEI"   Chemistry      Component Value Date/Time   NA 136 08/18/2021 0940   NA 141 02/06/2019 1210   K 4.5 08/18/2021 0940   CL 100 08/18/2021 0940   CO2 27 08/18/2021 0940   BUN 18 08/18/2021 0940   BUN 8 02/06/2019 1210   CREATININE 1.08 08/18/2021 0940      Component Value Date/Time   CALCIUM 10.0 08/18/2021 0940   ALKPHOS 166 (H) 08/18/2021 0940   AST 28 08/18/2021 0940   ALT 21 08/18/2021 0940   BILITOT 1.0 08/18/2021 0940       Impression and Plan: Brian [name] is a very pleasant 46 yo caucasian gentleman with an 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   This will be his fourth cycle of treatment.  After this, we will then plan for follow-up scans.  Hopefully, we will see that he is responding.  Hopefully, if he is not responding at least his disease is stable.  Again we are just focus on his quality of life.  This I think is going to be critical.  He wants to have a good quality of life so he can be with and enjoyed being with his family.  I will give him an extra week off after this cycle.  If he is responding, we will treat him in 3 weeks.   [name], MD 7/3/202310:36 AM

## 2021-08-20 ENCOUNTER — Other Ambulatory Visit (HOSPITAL_BASED_OUTPATIENT_CLINIC_OR_DEPARTMENT_OTHER): Payer: Self-pay

## 2021-08-20 ENCOUNTER — Telehealth: Payer: Self-pay

## 2021-08-20 ENCOUNTER — Inpatient Hospital Stay: Payer: BC Managed Care – PPO

## 2021-08-20 VITALS — BP 134/79 | HR 88 | Temp 98.5°F | Resp 16

## 2021-08-20 DIAGNOSIS — C73 Malignant neoplasm of thyroid gland: Secondary | ICD-10-CM

## 2021-08-20 MED ORDER — SODIUM CHLORIDE 0.9% FLUSH
10.0000 mL | INTRAVENOUS | Status: DC | PRN
  Administered 2021-08-20: 10 mL

## 2021-08-20 MED ORDER — HEPARIN SOD (PORK) LOCK FLUSH 100 UNIT/ML IV SOLN
500.0000 [IU] | Freq: Once | INTRAVENOUS | Status: AC | PRN
  Administered 2021-08-20: 500 [IU]

## 2021-08-20 NOTE — Telephone Encounter (Signed)
Pt informed of thyroid results while in office today, he states he cannot tolerate a large dose of synthroid and has an endocrinologist. Per Dr.Ennever fwding labs to patients endocrinologist for review.

## 2021-08-20 NOTE — Patient Instructions (Signed)

## 2021-08-20 NOTE — Telephone Encounter (Signed)
-----   Message from [name], MD sent at 08/18/2021  4:55 PM EDT ----- Please call and ask him if he is taking the Synthroid.  If not, we probably need to double the dose.  Thanks.  Laurey Arrow

## 2021-08-21 ENCOUNTER — Other Ambulatory Visit (HOSPITAL_BASED_OUTPATIENT_CLINIC_OR_DEPARTMENT_OTHER): Payer: Self-pay

## 2021-08-29 ENCOUNTER — Other Ambulatory Visit: Payer: Self-pay

## 2021-08-29 ENCOUNTER — Other Ambulatory Visit (HOSPITAL_BASED_OUTPATIENT_CLINIC_OR_DEPARTMENT_OTHER): Payer: Self-pay

## 2021-08-29 MED ORDER — PREDNISONE 50 MG PO TABS
ORAL_TABLET | ORAL | 0 refills | Status: DC
  Filled 2021-08-29 – 2021-09-01 (×3): qty 3, 1d supply, fill #0

## 2021-08-29 MED ORDER — DIPHENHYDRAMINE HCL 25 MG PO TABS
50.0000 mg | ORAL_TABLET | Freq: Once | ORAL | 0 refills | Status: DC
  Filled 2021-08-29: qty 100, 50d supply, fill #0

## 2021-09-01 ENCOUNTER — Other Ambulatory Visit (HOSPITAL_BASED_OUTPATIENT_CLINIC_OR_DEPARTMENT_OTHER): Payer: Self-pay

## 2021-09-01 ENCOUNTER — Other Ambulatory Visit (HOSPITAL_COMMUNITY): Payer: Self-pay

## 2021-09-02 ENCOUNTER — Ambulatory Visit (HOSPITAL_COMMUNITY)
Admission: RE | Admit: 2021-09-02 | Discharge: 2021-09-02 | Disposition: A | Discharge status: Home / Self Care | Payer: BC Managed Care – PPO | Source: Ambulatory Visit | Attending: Hematology & Oncology | Admitting: Hematology & Oncology

## 2021-09-02 DIAGNOSIS — C73 Malignant neoplasm of thyroid gland: Secondary | ICD-10-CM | POA: Diagnosis present

## 2021-09-02 LAB — GLUCOSE, CAPILLARY: Glucose-Capillary: 96 mg/dL (ref 70–99)

## 2021-09-02 MED ORDER — FLUDEOXYGLUCOSE F - 18 (FDG) INJECTION
13.8800 | Freq: Once | INTRAVENOUS | Status: AC | PRN
  Administered 2021-09-02: 13.88 via INTRAVENOUS

## 2021-09-04 ENCOUNTER — Encounter: Payer: Self-pay | Admitting: *Deleted

## 2021-09-05 ENCOUNTER — Ambulatory Visit (HOSPITAL_COMMUNITY)
Admission: RE | Admit: 2021-09-05 | Discharge: 2021-09-05 | Disposition: A | Discharge status: Home / Self Care | Payer: BC Managed Care – PPO | Source: Ambulatory Visit | Attending: Hematology & Oncology | Admitting: Hematology & Oncology

## 2021-09-05 DIAGNOSIS — C73 Malignant neoplasm of thyroid gland: Secondary | ICD-10-CM | POA: Insufficient documentation

## 2021-09-05 MED ORDER — GADOBUTROL 1 MMOL/ML IV SOLN
10.0000 mL | Freq: Once | INTRAVENOUS | Status: AC | PRN
  Administered 2021-09-05: 10 mL via INTRAVENOUS

## 2021-09-08 ENCOUNTER — Inpatient Hospital Stay: Payer: BC Managed Care – PPO

## 2021-09-08 ENCOUNTER — Other Ambulatory Visit: Payer: Self-pay

## 2021-09-08 ENCOUNTER — Other Ambulatory Visit (HOSPITAL_BASED_OUTPATIENT_CLINIC_OR_DEPARTMENT_OTHER): Payer: Self-pay

## 2021-09-08 ENCOUNTER — Encounter: Payer: Self-pay | Admitting: Hematology & Oncology

## 2021-09-08 ENCOUNTER — Inpatient Hospital Stay (HOSPITAL_BASED_OUTPATIENT_CLINIC_OR_DEPARTMENT_OTHER): Payer: BC Managed Care – PPO | Admitting: Hematology & Oncology

## 2021-09-08 ENCOUNTER — Ambulatory Visit (HOSPITAL_BASED_OUTPATIENT_CLINIC_OR_DEPARTMENT_OTHER)
Admission: RE | Admit: 2021-09-08 | Discharge: 2021-09-08 | Disposition: A | Discharge status: Home / Self Care | Payer: BC Managed Care – PPO | Source: Ambulatory Visit | Attending: Hematology & Oncology | Admitting: Hematology & Oncology

## 2021-09-08 ENCOUNTER — Encounter (HOSPITAL_BASED_OUTPATIENT_CLINIC_OR_DEPARTMENT_OTHER): Payer: Self-pay

## 2021-09-08 VITALS — BP 92/76 | HR 84 | Temp 98.4°F | Resp 18 | Wt 247.0 lb

## 2021-09-08 DIAGNOSIS — C73 Malignant neoplasm of thyroid gland: Secondary | ICD-10-CM

## 2021-09-08 DIAGNOSIS — D509 Iron deficiency anemia, unspecified: Secondary | ICD-10-CM

## 2021-09-08 DIAGNOSIS — E291 Testicular hypofunction: Secondary | ICD-10-CM

## 2021-09-08 LAB — CBC WITH DIFFERENTIAL (CANCER CENTER ONLY)
Abs Immature Granulocytes: 0.12 10*3/uL — ABNORMAL HIGH (ref 0.00–0.07)
Basophils Absolute: 0.1 10*3/uL (ref 0.0–0.1)
Basophils Relative: 1 %
Eosinophils Absolute: 0.1 10*3/uL (ref 0.0–0.5)
Eosinophils Relative: 1 %
HCT: 39.1 % (ref 39.0–52.0)
Hemoglobin: 12.3 g/dL — ABNORMAL LOW (ref 13.0–17.0)
Immature Granulocytes: 2 %
Lymphocytes Relative: 11 %
Lymphs Abs: 0.5 10*3/uL — ABNORMAL LOW (ref 0.7–4.0)
MCH: 30.4 pg (ref 26.0–34.0)
MCHC: 31.5 g/dL (ref 30.0–36.0)
MCV: 96.8 fL (ref 80.0–100.0)
Monocytes Absolute: 1.3 10*3/uL — ABNORMAL HIGH (ref 0.1–1.0)
Monocytes Relative: 27 %
Neutro Abs: 2.8 10*3/uL (ref 1.7–7.7)
Neutrophils Relative %: 58 %
Platelet Count: 208 10*3/uL (ref 150–400)
RBC: 4.04 MIL/uL — ABNORMAL LOW (ref 4.22–5.81)
RDW: 21.9 % — ABNORMAL HIGH (ref 11.5–15.5)
WBC Count: 4.9 10*3/uL (ref 4.0–10.5)
nRBC: 0.6 % — ABNORMAL HIGH (ref 0.0–0.2)

## 2021-09-08 LAB — CMP (CANCER CENTER ONLY)
ALT: 16 U/L (ref 0–44)
AST: 26 U/L (ref 15–41)
Albumin: 4.1 g/dL (ref 3.5–5.0)
Alkaline Phosphatase: 154 U/L — ABNORMAL HIGH (ref 38–126)
Anion gap: 7 (ref 5–15)
BUN: 16 mg/dL (ref 6–20)
CO2: 28 mmol/L (ref 22–32)
Calcium: 9.7 mg/dL (ref 8.9–10.3)
Chloride: 99 mmol/L (ref 98–111)
Creatinine: 0.95 mg/dL (ref 0.61–1.24)
GFR, Estimated: >60 mL/min (ref >60)
Glucose, Bld: 113 mg/dL — ABNORMAL HIGH (ref 70–99)
Potassium: 4 mmol/L (ref 3.5–5.1)
Sodium: 134 mmol/L — ABNORMAL LOW (ref 135–145)
Total Bilirubin: 1 mg/dL (ref 0.3–1.2)
Total Protein: 6.5 g/dL (ref 6.5–8.1)

## 2021-09-08 LAB — LACTATE DEHYDROGENASE: LDH: 230 U/L — ABNORMAL HIGH (ref 98–192)

## 2021-09-08 MED ORDER — LEUCOVORIN CALCIUM INJECTION 350 MG
400.0000 mg/m2 | Freq: Once | INTRAVENOUS | Status: AC
  Administered 2021-09-08: 936 mg via INTRAVENOUS
  Filled 2021-09-08: qty 46.8

## 2021-09-08 MED ORDER — TESTOSTERONE CYPIONATE 200 MG/ML IM SOLN
400.0000 mg | INTRAMUSCULAR | Status: DC
  Administered 2021-09-08: 400 mg via INTRAMUSCULAR
  Filled 2021-09-08: qty 2

## 2021-09-08 MED ORDER — TEMAZEPAM 30 MG PO CAPS
30.0000 mg | ORAL_CAPSULE | Freq: Every evening | ORAL | 0 refills | Status: DC | PRN
  Filled 2021-09-08: qty 30, 30d supply, fill #0
  Filled 2021-09-12: qty 15, 15d supply, fill #0
  Filled 2021-10-17: qty 15, 15d supply, fill #1

## 2021-09-08 MED ORDER — DEXTROSE 5 % IV SOLN: Freq: Once | INTRAVENOUS | Status: AC

## 2021-09-08 MED ORDER — SODIUM CHLORIDE 0.9 % IV SOLN
1920.0000 mg/m2 | INTRAVENOUS | Status: DC
  Administered 2021-09-08: 4500 mg via INTRAVENOUS
  Filled 2021-09-08: qty 90

## 2021-09-08 MED ORDER — LORAZEPAM 0.5 MG PO TABS: 0.5000 mg | ORAL_TABLET | Freq: Four times a day (QID) | ORAL | 0 refills | Status: DC | PRN

## 2021-09-08 MED ORDER — FLUOROURACIL CHEMO INJECTION 2.5 GM/50ML
400.0000 mg/m2 | Freq: Once | INTRAVENOUS | Status: AC
  Administered 2021-09-08: 950 mg via INTRAVENOUS
  Filled 2021-09-08: qty 19

## 2021-09-08 MED ORDER — ZOLEDRONIC ACID 4 MG/100ML IV SOLN
4.0000 mg | Freq: Once | INTRAVENOUS | Status: AC
  Administered 2021-09-08: 4 mg via INTRAVENOUS
  Filled 2021-09-08: qty 100

## 2021-09-08 MED ORDER — OXALIPLATIN CHEMO INJECTION 100 MG/20ML
68.0000 mg/m2 | Freq: Once | INTRAVENOUS | Status: AC
  Administered 2021-09-08: 160 mg via INTRAVENOUS
  Filled 2021-09-08: qty 32

## 2021-09-08 MED ORDER — PALONOSETRON HCL INJECTION 0.25 MG/5ML
0.2500 mg | Freq: Once | INTRAVENOUS | Status: AC
  Administered 2021-09-08: 0.25 mg via INTRAVENOUS
  Filled 2021-09-08: qty 5

## 2021-09-08 MED ORDER — HYDROMORPHONE HCL 4 MG/ML IJ SOLN
4.0000 mg | Freq: Once | INTRAMUSCULAR | Status: AC
  Administered 2021-09-08: 4 mg via INTRAVENOUS
  Filled 2021-09-08: qty 1

## 2021-09-08 MED ORDER — IOHEXOL 300 MG/ML  SOLN
100.0000 mL | Freq: Once | INTRAMUSCULAR | Status: AC | PRN
  Administered 2021-09-08: 75 mL via INTRAVENOUS

## 2021-09-08 MED ORDER — SODIUM CHLORIDE 0.9 % IV SOLN
10.0000 mg | Freq: Once | INTRAVENOUS | Status: AC
  Administered 2021-09-08: 10 mg via INTRAVENOUS
  Filled 2021-09-08: qty 10

## 2021-09-08 MED ORDER — METOCLOPRAMIDE HCL 10 MG PO TABS
10.0000 mg | ORAL_TABLET | Freq: Four times a day (QID) | ORAL | 6 refills | Status: DC | PRN
  Filled 2021-09-08: qty 30, 8d supply, fill #0
  Filled 2021-09-22: qty 30, 8d supply, fill #1
  Filled 2021-10-17: qty 30, 8d supply, fill #2
  Filled 2021-11-25: qty 30, 8d supply, fill #3
  Filled 2021-12-08: qty 30, 8d supply, fill #4
  Filled 2022-01-21: qty 30, 8d supply, fill #5
  Filled 2022-02-04: qty 30, 8d supply, fill #6

## 2021-09-08 NOTE — Progress Notes (Signed)
Zometa changed to every 2 months per Dr. Antonieta Pert instructions.

## 2021-09-08 NOTE — Patient Instructions (Signed)

## 2021-09-08 NOTE — Progress Notes (Signed)
1020-Port flushed and pt sent to radiology for CT via w/c and assist of RN.

## 2021-09-08 NOTE — Patient Instructions (Signed)
[facility]  Discharge Instructions: Thank you for choosing [facility] to provide your oncology and hematology care.   If you have a lab appointment with the [facility], please go directly to the [facility] and check in at the registration area.  Wear comfortable clothing and clothing appropriate for easy access to any Portacath or PICC line.   We strive to give you quality time with your provider. You may need to reschedule your appointment if you arrive late (15 or more minutes).  Arriving late affects you and other patients whose appointments are after yours.  Also, if you miss three or more appointments without notifying the office, you may be dismissed from the clinic at the provider's discretion.      For prescription refill requests, have your pharmacy contact our office and allow 72 hours for refills to be completed.    Today you received the following chemotherapy and/or immunotherapy agents:  Oxaliplatin, Leucovorin and 5FU.       To help prevent nausea and vomiting after your treatment, we encourage you to take your nausea medication as directed.  BELOW ARE SYMPTOMS THAT SHOULD BE REPORTED IMMEDIATELY: *FEVER GREATER THAN 100.4 F (38 C) OR HIGHER *CHILLS OR SWEATING *NAUSEA AND VOMITING THAT IS NOT CONTROLLED WITH YOUR NAUSEA MEDICATION *UNUSUAL SHORTNESS OF BREATH *UNUSUAL BRUISING OR BLEEDING *URINARY PROBLEMS (pain or burning when urinating, or frequent urination) *BOWEL PROBLEMS (unusual diarrhea, constipation, pain near the anus) TENDERNESS IN MOUTH AND THROAT WITH OR WITHOUT PRESENCE OF ULCERS (sore throat, sores in mouth, or a toothache) UNUSUAL RASH, SWELLING OR PAIN  UNUSUAL VAGINAL DISCHARGE OR ITCHING   Items with * indicate a potential emergency and should be followed up as soon as possible or go to the Emergency Department if any problems should occur.  Please show the CHEMOTHERAPY ALERT CARD or IMMUNOTHERAPY ALERT  CARD at check-in to the Emergency Department and triage nurse. Should you have questions after your visit or need to cancel or reschedule your appointment, please contact [facility]  [phone] and follow the prompts.  Office hours are 8:00 a.m. to 4:30 p.m. Monday - Friday. Please note that voicemails left after 4:00 p.m. may not be returned until the following business day.  We are closed weekends and major holidays. You have access to a nurse at all times for urgent questions. Please call the main number to the clinic [phone] and follow the prompts.  For any non-urgent questions, you may also contact your provider using MyChart. We now offer e-Visits for anyone 47 and older to request care online for non-urgent symptoms. For details visit [email or web address].   Also download the MyChart app! Go to the app store, search "MyChart", open the app, select [facility], and log in with your MyChart username and password.  Masks are optional in the cancer centers. If you would like for your care team to wear a mask while they are taking care of you, please let them know. For doctor visits, patients may have with them one support person who is at least 46 years old. At this time, visitors are not allowed in the infusion area.

## 2021-09-08 NOTE — Progress Notes (Signed)
Hematology and Oncology Follow Up Visit  Brian [name] [id number] 23-Jun-1975 46 y.o. 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 + Bilateral pulmonary emboli without right heart strain  DVT left popliteal vein    Past Therapy: Cabometyx 40 mg po q day -- start on 04/17/2019 - d/c on 07/17/2019 for progression Lenvima 24 mg po q day -- on hold due to proteinuria Taxotere/CDDP -- s/p cycle 4 - started on 09/13/2019   Current Therapy:      Vinblastine q week x 4 weeks on / 1 week off -- s/p  cycle #2 -- start on 02/24/2021 -- d/c on 05/20/2021 Gavreto 400 mg po q day -- started on 05/17/2020  -- d/c on 06/16/2020 Adriamycin weekly 2 weeks on 1 week off - s/p cycle 8 --d/c on 04/16/2020 due to progression XRT for LEFT shoulder met -completed on 06/22/2021 Pembrolizumab 200 mg IV q 3 week -- s/p cycle #8 - start on 06/26/2020 -- d/c on 01/16/2021 due to progression Zometa 4 mg IV q 2 months - next dose on 10/2021 Eliquis 5 mg po BID FOLFOX -- s/p cycle #4 -- start on 07/08/2021   Interim History:  Brian [name] is here today for follow-up.  He has responded well to treatment.  I am absolutely surprised as to how well he is done.  The PET scan and the MRI of the liver both show that he was responding.  Unfortunately, now looks like he has a lymph node in the left submandibular region.  I do not know if this is malignant if this is reactive.  They were going to have to get a CT scan so that we can see what might be going on up there.  Again, the MRI of the liver and the PET scan does show he is responding.  As such, I think it be worthwhile for [place] to continue him on treatment.     He is having some pain issues.  I am really not surprised by this.  He is on Eliquis.  He is doing well with the Eliquis.  There is no bleeding.  He has had no obvious change in bowel or bladder habits.  There is been no leg swelling.  He is doing  well with the testosterone.  Back in June, testosterone was 402.  Overall, I would have said that his performance status right now is probably ECOG 1.    Medications:  Allergies as of 09/08/2021       Reactions   Dextromethorphan-guaifenesin Other (See Comments)   Irregular heartbeat   Doxycycline Anaphylaxis, Nausea And Vomiting, Rash, Other (See Comments)   "heart arrythmia" and "dyspepsia" (only oral doxycycline causes reaction)   Gadobutrol Hives, Other (See Comments)   Patient had MRI scan at [place]. Patient called one hour after he left imaging facility to report two "blisters" that came up on "back" of lip.    Gadolinium Derivatives Hives   Patient had MRI scan at [place]. Patient called one hour after he left imaging facility to report two "blisters" that came up on "back" of lip.    Guaifenesin Palpitations      Ibuprofen Hives, Itching   Lisinopril Other (See Comments)   Angioedema   Pantoprazole Itching   Tramadol Hives, Itching   Nexavar [sorafenib] Hives   Barium Rash   Developed redness around neck after drinking 1st bottle of Barocat; pt was given  Benedryl by ED Mds to be "on the safe side" before drinking 2nd bottle   Chlorhexidine Hives        Medication List        Accurate as of September 08, 2021  9:37 AM. If you have any questions, ask your nurse or doctor.          cyclobenzaprine 10 MG tablet Commonly known as: FLEXERIL Take 1 tablet (10 mg total) by mouth 3 (three) times daily as needed.   dexamethasone 4 MG tablet Commonly known as: DECADRON Take 2 tablets (8 mg total) by mouth daily. Start the day after chemotherapy for 2 days. Take with food.   diphenhydrAMINE 25 MG tablet Commonly known as: BENADRYL Take 2 tablets(50 mg total) by mouth one hour prior to CT Scan   Eliquis 5 MG Tabs tablet Generic drug: apixaban TAKE 1 TABLET (5 MG TOTAL) BY MOUTH 2 (TWO) TIMES DAILY.   EPINEPHrine 0.3 mg/0.3 mL Soaj  injection Commonly known as: EpiPen 2-Pak USE AS DIRECTED FOR LIFE THREATENING ALLERGIC REACTIONS   famotidine 40 MG tablet Commonly known as: PEPCID Take 1 tablet (40 mg total) by mouth 2 (two) times daily.   Fusion Plus Caps Take 1 capsule by mouth daily.   HYDROmorphone 8 MG tablet Commonly known as: DILAUDID Take 1 tablet (8 mg total) by mouth every 4 (four) hours as needed for severe pain.   levothyroxine 200 MCG tablet Commonly known as: SYNTHROID Take one tablet (200 mcg dose) by mouth daily. Total:  288 mcg/day.   lidocaine-prilocaine cream Commonly known as: EMLA Apply to affected area once   LORazepam 0.5 MG tablet Commonly known as: Ativan Take 1 tablet (0.5 mg total) by mouth every 6 (six) hours as needed (Nausea or vomiting). What changed: Another medication with the same name was removed. Continue taking this medication, and follow the directions you see here. Changed by: Melton Krebs, RN   methylphenidate 10 MG tablet Commonly known as: RITALIN Take 1 tablet (10 mg total) by mouth 2 (two) times daily.   metoCLOPramide 10 MG tablet Commonly known as: REGLAN Take 1 tablet (10 mg total) by mouth every 6 (six) hours as needed for nausea or vomiting.   metoprolol tartrate 50 MG tablet Commonly known as: Lopressor Take 1 tablet (50 mg total) by mouth 2 (two) times daily.   multivitamin capsule Take 1 capsule by mouth daily.   OLANZapine 10 MG tablet Commonly known as: ZYPREXA TAKE 1 TABLET (10 MG TOTAL) BY MOUTH AT BEDTIME.   ondansetron 8 MG tablet Commonly known as: Zofran Take 1 tablet (8 mg total) by mouth 2 (two) times daily as needed for refractory nausea / vomiting. Start on day 3 after chemotherapy.   PEG 3350 17 GM/SCOOP Powd Mix 17 grams (1 Capful) in 8 ounces of liquid and drink by mouth daily as needed.   potassium chloride SA 20 MEQ tablet Commonly known as: KLOR-CON M Take 1 tablet (20 mEq total) by mouth daily.   predniSONE 50 MG  tablet Commonly known as: DELTASONE TAKE 1 TABLET BY MOUTH 13 HOURS BEFORE CT SCAN, 1 TABLET 7 HOURS BEFORE CT SCAN, AND 1 TABLET 1 HOUR BEFORE CT SCAN   prochlorperazine 10 MG tablet Commonly known as: COMPAZINE Take 1 tablet (10 mg total) by mouth every 6 (six) hours as needed (Nausea or vomiting).   promethazine 25 MG tablet Commonly known as: PHENERGAN TAKE 1 TABLET BY MOUTH EVERY 6 HOURS AS NEEDED FOR NAUSEA &  VOMITING   senna-docusate 8.6-50 MG tablet Commonly known as: Senokot-S Take 1 tablet by mouth at bedtime as needed for mild constipation.   tamsulosin 0.4 MG Caps capsule Commonly known as: Flomax Take 1 capsule (0.4 mg total) by mouth daily after supper.   temazepam 30 MG capsule Commonly known as: RESTORIL Take 1 capsule (30 mg total) by mouth at bedtime as needed for sleep.   Xtampza ER 36 MG C12a Generic drug: oxyCODONE ER Take 1 capsule (36 mg total) by mouth every 12 (twelve) hours        Allergies:  Allergies  Allergen Reactions   Dextromethorphan-Guaifenesin Other (See Comments)    Irregular heartbeat    Doxycycline Anaphylaxis, Nausea And Vomiting, Rash and Other (See Comments)    "heart arrythmia" and "dyspepsia" (only oral doxycycline causes reaction)   Gadobutrol Hives and Other (See Comments)    Patient had MRI scan at [place]. Patient called one hour after he left imaging facility to report two "blisters" that came up on "back" of lip.     Gadolinium Derivatives Hives    Patient had MRI scan at [place]. Patient called one hour after he left imaging facility to report two "blisters" that came up on "back" of lip.    Guaifenesin Palpitations        Ibuprofen Hives and Itching   Lisinopril Other (See Comments)    Angioedema   Pantoprazole Itching   Tramadol Hives and Itching   Nexavar [Sorafenib] Hives   Barium Rash    Developed redness around neck after drinking 1st bottle of Barocat; pt was given Benedryl by ED Mds  to be "on the safe side" before drinking 2nd bottle   Chlorhexidine Hives    Past Medical History, Surgical history, Social history, and Family History were reviewed and updated.  Review of Systems: Review of Systems  Constitutional:  Positive for malaise/fatigue.  HENT: Negative.    Eyes: Negative.   Respiratory: Negative.    Cardiovascular: Negative.   Gastrointestinal:  Positive for abdominal pain, constipation, heartburn and nausea.  Genitourinary: Negative.   Musculoskeletal:  Positive for joint pain.  Skin: Negative.   Neurological: Negative.   Endo/Heme/Allergies: Negative.   Psychiatric/Behavioral: Negative.       Physical Exam:  weight is 247 lb (112 kg). His oral temperature is 98.4 F (36.9 C). His blood pressure is 92/76 and his pulse is 84. His respiration is 18 and oxygen saturation is 96%.   Wt Readings from Last 3 Encounters:  09/08/21 247 lb (112 kg)  08/18/21 245 lb (111.1 kg)  08/05/21 244 lb 1.3 oz (110.7 kg)    Physical Exam Vitals reviewed.  HENT:     Head: Normocephalic and atraumatic.  Eyes:     Pupils: Pupils are equal, round, and reactive to light.  Cardiovascular:     Heart sounds: Normal heart sounds.     Comments: Cardiac exam shows a tachycardic rate.  He has occasional extra beat.  I do not hear any obvious murmurs or rubs. Pulmonary:     Effort: Pulmonary effort is normal.     Breath sounds: Normal breath sounds.  Abdominal:     General: Bowel sounds are normal.     Palpations: Abdomen is soft.  Musculoskeletal:        General: No tenderness or deformity. Normal range of motion.     Cervical back: Normal range of motion.  Lymphadenopathy:     Cervical: No cervical adenopathy.  Skin:  General: Skin is warm and dry.     Findings: No erythema or rash.  Neurological:     Mental Status: He is alert and oriented to person, place, and time.  Psychiatric:        Behavior: Behavior normal.        Thought Content: Thought content  normal.        Judgment: Judgment normal.      Lab Results  Component Value Date   WBC 4.9 09/08/2021   HGB 12.3 (L) 09/08/2021   HCT 39.1 09/08/2021   MCV 96.8 09/08/2021   PLT 208 09/08/2021   Lab Results  Component Value Date   FERRITIN 958 (H) 08/18/2021   IRON 108 08/18/2021   TIBC 349 08/18/2021   UIBC 241 08/18/2021   IRONPCTSAT 31 08/18/2021   Lab Results  Component Value Date   RETICCTPCT 3.9 (H) 08/08/2020   RBC 4.04 (L) 09/08/2021   No results found for: "KPAFRELGTCHN", "LAMBDASER", "KAPLAMBRATIO" No results found for: "IGGSERUM", "IGA", "IGMSERUM" No results found for: "TOTALPROTELP", "ALBUMINELP", "A1GS", "A2GS", "BETS", "BETA2SER", "GAMS", "MSPIKE", "SPEI"   Chemistry      Component Value Date/Time   NA 134 (L) 09/08/2021 0809   NA 141 02/06/2019 1210   K 4.0 09/08/2021 0809   CL 99 09/08/2021 0809   CO2 28 09/08/2021 0809   BUN 16 09/08/2021 0809   BUN 8 02/06/2019 1210   CREATININE 0.95 09/08/2021 0809      Component Value Date/Time   CALCIUM 9.7 09/08/2021 0809   ALKPHOS 154 (H) 09/08/2021 0809   AST 26 09/08/2021 0809   ALT 16 09/08/2021 0809   BILITOT 1.0 09/08/2021 0809       Impression and Plan: Brian [name] is a very pleasant 46 yo caucasian gentleman with an 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   Again, I am very impressed by the fact that he is responding.  I know there are very, very, very few studies that look at FOLFOX with this, malignancy.  Hopefully, we will continue to see a response.  I am not sure what to make of this lymph node in the left submandibular region.  Hopefully, this is not a malignant lymph node.  Hopefully, we will not need to biopsy it.  We will see about getting a CT scan today.  Again, quality of life is what we are prioritizing.  This is what is important for him.  I know that he wants to be with his family as much as possible.  We will go ahead with his fifth cycle of treatment.   We will try to do a total of 8 cycles and then repeat her scans.  He will get his Zometa today.   [name], MD 7/24/20239:37 AM

## 2021-09-09 ENCOUNTER — Other Ambulatory Visit (HOSPITAL_BASED_OUTPATIENT_CLINIC_OR_DEPARTMENT_OTHER): Payer: Self-pay

## 2021-09-09 ENCOUNTER — Other Ambulatory Visit: Payer: Self-pay

## 2021-09-09 MED ORDER — CEPHALEXIN 500 MG PO CAPS
ORAL_CAPSULE | ORAL | 0 refills | Status: DC
  Filled 2021-09-09: qty 30, 10d supply, fill #0

## 2021-09-10 ENCOUNTER — Other Ambulatory Visit (HOSPITAL_BASED_OUTPATIENT_CLINIC_OR_DEPARTMENT_OTHER): Payer: Self-pay

## 2021-09-10 ENCOUNTER — Inpatient Hospital Stay: Payer: BC Managed Care – PPO

## 2021-09-10 VITALS — BP 101/80 | HR 77 | Temp 98.4°F | Resp 18

## 2021-09-10 DIAGNOSIS — C73 Malignant neoplasm of thyroid gland: Secondary | ICD-10-CM

## 2021-09-10 MED ORDER — SODIUM CHLORIDE 0.9% FLUSH
10.0000 mL | INTRAVENOUS | Status: DC | PRN
  Administered 2021-09-10: 10 mL

## 2021-09-10 MED ORDER — HEPARIN SOD (PORK) LOCK FLUSH 100 UNIT/ML IV SOLN
500.0000 [IU] | Freq: Once | INTRAVENOUS | Status: AC | PRN
  Administered 2021-09-10: 500 [IU]

## 2021-09-10 NOTE — Patient Instructions (Signed)

## 2021-09-12 ENCOUNTER — Other Ambulatory Visit: Payer: Self-pay | Admitting: Hematology & Oncology

## 2021-09-12 ENCOUNTER — Other Ambulatory Visit (HOSPITAL_BASED_OUTPATIENT_CLINIC_OR_DEPARTMENT_OTHER): Payer: Self-pay

## 2021-09-12 DIAGNOSIS — C73 Malignant neoplasm of thyroid gland: Secondary | ICD-10-CM

## 2021-09-12 MED ORDER — CYCLOBENZAPRINE HCL 10 MG PO TABS
10.0000 mg | ORAL_TABLET | Freq: Three times a day (TID) | ORAL | 1 refills | Status: DC | PRN
  Filled 2021-09-12: qty 90, 30d supply, fill #0
  Filled 2021-11-25: qty 90, 30d supply, fill #1

## 2021-09-19 ENCOUNTER — Other Ambulatory Visit (HOSPITAL_BASED_OUTPATIENT_CLINIC_OR_DEPARTMENT_OTHER): Payer: Self-pay

## 2021-09-22 ENCOUNTER — Other Ambulatory Visit: Payer: Self-pay | Admitting: Hematology & Oncology

## 2021-09-22 ENCOUNTER — Other Ambulatory Visit (HOSPITAL_BASED_OUTPATIENT_CLINIC_OR_DEPARTMENT_OTHER): Payer: Self-pay

## 2021-09-22 ENCOUNTER — Inpatient Hospital Stay: Payer: BC Managed Care – PPO | Attending: Hematology & Oncology

## 2021-09-22 ENCOUNTER — Inpatient Hospital Stay: Payer: BC Managed Care – PPO

## 2021-09-22 ENCOUNTER — Inpatient Hospital Stay (HOSPITAL_BASED_OUTPATIENT_CLINIC_OR_DEPARTMENT_OTHER): Payer: BC Managed Care – PPO | Admitting: Hematology & Oncology

## 2021-09-22 ENCOUNTER — Other Ambulatory Visit: Payer: Self-pay

## 2021-09-22 ENCOUNTER — Encounter: Payer: Self-pay | Admitting: Hematology & Oncology

## 2021-09-22 ENCOUNTER — Other Ambulatory Visit: Payer: Self-pay | Admitting: Family

## 2021-09-22 VITALS — BP 120/88 | HR 93 | Temp 97.6°F | Resp 18 | Wt 253.0 lb

## 2021-09-22 DIAGNOSIS — C73 Malignant neoplasm of thyroid gland: Secondary | ICD-10-CM | POA: Insufficient documentation

## 2021-09-22 DIAGNOSIS — Z79899 Other long term (current) drug therapy: Secondary | ICD-10-CM | POA: Diagnosis not present

## 2021-09-22 DIAGNOSIS — E291 Testicular hypofunction: Secondary | ICD-10-CM

## 2021-09-22 DIAGNOSIS — I824Z2 Acute embolism and thrombosis of unspecified deep veins of left distal lower extremity: Secondary | ICD-10-CM | POA: Diagnosis not present

## 2021-09-22 DIAGNOSIS — M546 Pain in thoracic spine: Secondary | ICD-10-CM

## 2021-09-22 DIAGNOSIS — C787 Secondary malignant neoplasm of liver and intrahepatic bile duct: Secondary | ICD-10-CM

## 2021-09-22 DIAGNOSIS — D509 Iron deficiency anemia, unspecified: Secondary | ICD-10-CM

## 2021-09-22 DIAGNOSIS — Z5111 Encounter for antineoplastic chemotherapy: Secondary | ICD-10-CM | POA: Insufficient documentation

## 2021-09-22 DIAGNOSIS — E038 Other specified hypothyroidism: Secondary | ICD-10-CM

## 2021-09-22 DIAGNOSIS — G893 Neoplasm related pain (acute) (chronic): Secondary | ICD-10-CM

## 2021-09-22 LAB — CBC WITH DIFFERENTIAL (CANCER CENTER ONLY)
Abs Immature Granulocytes: 0.02 10*3/uL (ref 0.00–0.07)
Basophils Absolute: 0 10*3/uL (ref 0.0–0.1)
Basophils Relative: 1 %
Eosinophils Absolute: 0.1 10*3/uL (ref 0.0–0.5)
Eosinophils Relative: 2 %
HCT: 39.3 % (ref 39.0–52.0)
Hemoglobin: 12.2 g/dL — ABNORMAL LOW (ref 13.0–17.0)
Immature Granulocytes: 1 %
Lymphocytes Relative: 10 %
Lymphs Abs: 0.4 10*3/uL — ABNORMAL LOW (ref 0.7–4.0)
MCH: 30.7 pg (ref 26.0–34.0)
MCHC: 31 g/dL (ref 30.0–36.0)
MCV: 99 fL (ref 80.0–100.0)
Monocytes Absolute: 0.6 10*3/uL (ref 0.1–1.0)
Monocytes Relative: 13 %
Neutro Abs: 3.1 10*3/uL (ref 1.7–7.7)
Neutrophils Relative %: 73 %
Platelet Count: 147 10*3/uL — ABNORMAL LOW (ref 150–400)
RBC: 3.97 MIL/uL — ABNORMAL LOW (ref 4.22–5.81)
RDW: 19.9 % — ABNORMAL HIGH (ref 11.5–15.5)
WBC Count: 4.2 10*3/uL (ref 4.0–10.5)
nRBC: 0 % (ref 0.0–0.2)

## 2021-09-22 LAB — CMP (CANCER CENTER ONLY)
ALT: 14 U/L (ref 0–44)
AST: 25 U/L (ref 15–41)
Albumin: 4.1 g/dL (ref 3.5–5.0)
Alkaline Phosphatase: 117 U/L (ref 38–126)
Anion gap: 8 (ref 5–15)
BUN: 12 mg/dL (ref 6–20)
CO2: 26 mmol/L (ref 22–32)
Calcium: 9.5 mg/dL (ref 8.9–10.3)
Chloride: 103 mmol/L (ref 98–111)
Creatinine: 1.17 mg/dL (ref 0.61–1.24)
GFR, Estimated: >60 mL/min (ref >60)
Glucose, Bld: 147 mg/dL — ABNORMAL HIGH (ref 70–99)
Potassium: 4.3 mmol/L (ref 3.5–5.1)
Sodium: 137 mmol/L (ref 135–145)
Total Bilirubin: 0.7 mg/dL (ref 0.3–1.2)
Total Protein: 6.2 g/dL — ABNORMAL LOW (ref 6.5–8.1)

## 2021-09-22 LAB — LACTATE DEHYDROGENASE: LDH: 222 U/L — ABNORMAL HIGH (ref 98–192)

## 2021-09-22 LAB — SAMPLE TO BLOOD BANK

## 2021-09-22 LAB — TSH: TSH: 15.079 u[IU]/mL — ABNORMAL HIGH (ref 0.350–4.500)

## 2021-09-22 MED ORDER — FLUOROURACIL CHEMO INJECTION 2.5 GM/50ML
400.0000 mg/m2 | Freq: Once | INTRAVENOUS | Status: AC
  Administered 2021-09-22: 950 mg via INTRAVENOUS
  Filled 2021-09-22: qty 19

## 2021-09-22 MED ORDER — OXALIPLATIN CHEMO INJECTION 100 MG/20ML
68.0000 mg/m2 | Freq: Once | INTRAVENOUS | Status: AC
  Administered 2021-09-22: 160 mg via INTRAVENOUS
  Filled 2021-09-22: qty 32

## 2021-09-22 MED ORDER — XTAMPZA ER 36 MG PO C12A
36.0000 mg | EXTENDED_RELEASE_CAPSULE | Freq: Two times a day (BID) | ORAL | 0 refills | Status: DC
  Filled 2021-09-22: qty 60, 30d supply, fill #0

## 2021-09-22 MED ORDER — PALONOSETRON HCL INJECTION 0.25 MG/5ML
0.2500 mg | Freq: Once | INTRAVENOUS | Status: AC
  Administered 2021-09-22: 0.25 mg via INTRAVENOUS
  Filled 2021-09-22: qty 5

## 2021-09-22 MED ORDER — SODIUM CHLORIDE 0.9 % IV SOLN
1920.0000 mg/m2 | INTRAVENOUS | Status: DC
  Administered 2021-09-22: 4500 mg via INTRAVENOUS
  Filled 2021-09-22: qty 90

## 2021-09-22 MED ORDER — FUSION PLUS PO CAPS
1.0000 | ORAL_CAPSULE | Freq: Every day | ORAL | 6 refills | Status: DC
  Filled 2021-09-22: qty 30, 30d supply, fill #0
  Filled 2021-11-06: qty 30, 30d supply, fill #1
  Filled 2022-01-21: qty 30, 30d supply, fill #2
  Filled 2022-04-23: qty 30, 30d supply, fill #3

## 2021-09-22 MED ORDER — TESTOSTERONE CYPIONATE 200 MG/ML IM SOLN
400.0000 mg | INTRAMUSCULAR | Status: DC
  Administered 2021-09-22: 400 mg via INTRAMUSCULAR
  Filled 2021-09-22: qty 2

## 2021-09-22 MED ORDER — METOPROLOL TARTRATE 50 MG PO TABS
50.0000 mg | ORAL_TABLET | Freq: Two times a day (BID) | ORAL | 4 refills | Status: DC
  Filled 2021-09-22: qty 60, 30d supply, fill #0
  Filled 2021-11-04: qty 60, 30d supply, fill #1
  Filled 2021-12-08: qty 60, 30d supply, fill #2
  Filled 2022-01-21: qty 60, 30d supply, fill #3
  Filled 2022-03-09: qty 60, 30d supply, fill #4

## 2021-09-22 MED ORDER — LEUCOVORIN CALCIUM INJECTION 350 MG
400.0000 mg/m2 | Freq: Once | INTRAVENOUS | Status: AC
  Administered 2021-09-22: 936 mg via INTRAVENOUS
  Filled 2021-09-22: qty 46.8

## 2021-09-22 MED ORDER — SODIUM CHLORIDE 0.9 % IV SOLN
10.0000 mg | Freq: Once | INTRAVENOUS | Status: AC
  Administered 2021-09-22: 10 mg via INTRAVENOUS
  Filled 2021-09-22: qty 10

## 2021-09-22 MED ORDER — DEXTROSE 5 % IV SOLN: Freq: Once | INTRAVENOUS | Status: AC

## 2021-09-22 MED ORDER — FAMOTIDINE 40 MG PO TABS
40.0000 mg | ORAL_TABLET | Freq: Two times a day (BID) | ORAL | 4 refills | Status: DC
  Filled 2021-09-22: qty 60, 30d supply, fill #0
  Filled 2021-11-04: qty 60, 30d supply, fill #1
  Filled 2021-12-08: qty 60, 30d supply, fill #2
  Filled 2022-01-21: qty 60, 30d supply, fill #3
  Filled 2022-04-03: qty 60, 30d supply, fill #4

## 2021-09-22 MED ORDER — LEUCOVORIN CALCIUM INJECTION 350 MG
400.0000 mg/m2 | Freq: Once | INTRAVENOUS | Status: DC
  Filled 2021-09-22 (×3): qty 46.8

## 2021-09-22 NOTE — Patient Instructions (Signed)

## 2021-09-22 NOTE — Progress Notes (Signed)
Hematology and Oncology Follow Up Visit  Hodge P Coffield III [id number] 07/19/1975 46 y.o. 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 + Bilateral pulmonary emboli without right heart strain  DVT left popliteal vein    Past Therapy: Cabometyx 40 mg po q day -- start on 04/17/2019 - d/c on 07/17/2019 for progression Lenvima 24 mg po q day -- on hold due to proteinuria Taxotere/CDDP -- s/p cycle 4 - started on 09/13/2019   Current Therapy:      Vinblastine q week x 4 weeks on / 1 week off -- s/p  cycle #2 -- start on 02/24/2021 -- d/c on 05/20/2021 Gavreto 400 mg po q day -- started on 05/17/2020  -- d/c on 06/16/2020 Adriamycin weekly 2 weeks on 1 week off - s/p cycle 8 --d/c on 04/16/2020 due to progression XRT for LEFT shoulder met -completed on 06/22/2021 Pembrolizumab 200 mg IV q 3 week -- s/p cycle #8 - start on 06/26/2020 -- d/c on 01/16/2021 due to progression Zometa 4 mg IV q 2 months - next dose on 10/2021 Eliquis 5 mg po BID FOLFOX -- s/p cycle #5 -- start on 07/08/2021   Interim History:  Mr. Demps is here today for follow-up.  He really does look good.  I must say I think he is tolerated treatment incredibly well.  Even one-point is a fact that I think that he has responded to treatment.  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  This is in his left submandibular gland.  He has seen ENT for this.  It sounds like he is going need to have surgery to see if that stone cannot be removed.  The last time that was here he had a CT scan which did show the stone.  It was a fairly large stone.  Otherwise, he really is doing nicely.  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  He totally enjoys this.  His appetite has been good.  He has had no nausea or vomiting.  He has had no cough or shortness of breath.  He has had no bleeding.  He is on Eliquis.  He has had no problems with leg swelling.  Is been no  rashes.  Overall, I was his performance status is probably ECOG 1.  He is doing well with the testosterone.  Back in June, testosterone was 402.  Overall, I would have said that his performance status right now is probably ECOG 1.    Medications:  Allergies as of 09/22/2021       Reactions   Dextromethorphan-guaifenesin Other (See Comments)   Irregular heartbeat   Doxycycline Anaphylaxis, Nausea And Vomiting, Rash, Other (See Comments)   "heart arrythmia" and "dyspepsia" (only oral doxycycline causes reaction)   Gadobutrol Hives, Other (See Comments)   Patient had MRI scan at [facility]. Patient called one hour after he left imaging facility to report two "blisters" that came up on "back" of lip.    Gadolinium Derivatives Hives   Patient had MRI scan at [facility]. Patient called one hour after he left imaging facility to report two "blisters" that came up on "back" of lip.    Guaifenesin Palpitations      Ibuprofen Hives, Itching   Lisinopril Other (See Comments)   Angioedema   Pantoprazole Itching   Tramadol Hives, Itching   Nexavar [sorafenib] Hives   Barium Rash     Developed redness around neck after drinking 1st bottle of Barocat; pt was given Benedryl by ED Mds to be "on the safe side" before drinking 2nd bottle   Chlorhexidine Hives        Medication List        Accurate as of September 22, 2021 10:13 AM. If you have any questions, ask your nurse or doctor.          STOP taking these medications    cephALEXin 500 MG capsule Commonly known as: KEFLEX Stopped by: [name], MD       TAKE these medications    cyclobenzaprine 10 MG tablet Commonly known as: FLEXERIL Take 1 tablet (10 mg total) by mouth 3 (three) times daily as needed.   dexamethasone 4 MG tablet Commonly known as: DECADRON Take 2 tablets (8 mg total) by mouth daily. Start the day after chemotherapy for 2 days. Take with food.   diphenhydrAMINE 25 MG tablet Commonly known  as: BENADRYL Take 2 tablets(50 mg total) by mouth one hour prior to CT Scan   Eliquis 5 MG Tabs tablet Generic drug: apixaban TAKE 1 TABLET (5 MG TOTAL) BY MOUTH 2 (TWO) TIMES DAILY.   EPINEPHrine 0.3 mg/0.3 mL Soaj injection Commonly known as: EpiPen 2-Pak USE AS DIRECTED FOR LIFE THREATENING ALLERGIC REACTIONS   famotidine 40 MG tablet Commonly known as: PEPCID Take 1 tablet (40 mg total) by mouth 2 (two) times daily.   Fusion Plus Caps Take 1 capsule by mouth daily.   HYDROmorphone 8 MG tablet Commonly known as: DILAUDID Take 1 tablet (8 mg total) by mouth every 4 (four) hours as needed for severe pain.   levothyroxine 200 MCG tablet Commonly known as: SYNTHROID Take one tablet (200 mcg dose) by mouth daily. Total:  288 mcg/day.   lidocaine-prilocaine cream Commonly known as: EMLA Apply to affected area once   LORazepam 0.5 MG tablet Commonly known as: Ativan Take 1 tablet (0.5 mg total) by mouth every 6 (six) hours as needed (Nausea or vomiting).   methylphenidate 10 MG tablet Commonly known as: RITALIN Take 1 tablet (10 mg total) by mouth 2 (two) times daily.   metoCLOPramide 10 MG tablet Commonly known as: REGLAN Take 1 tablet (10 mg total) by mouth every 6 (six) hours as needed for nausea or vomiting.   metoprolol tartrate 50 MG tablet Commonly known as: Lopressor Take 1 tablet (50 mg total) by mouth 2 (two) times daily.   multivitamin capsule Take 1 capsule by mouth daily.   OLANZapine 10 MG tablet Commonly known as: ZYPREXA TAKE 1 TABLET (10 MG TOTAL) BY MOUTH AT BEDTIME.   ondansetron 8 MG tablet Commonly known as: Zofran Take 1 tablet (8 mg total) by mouth 2 (two) times daily as needed for refractory nausea / vomiting. Start on day 3 after chemotherapy.   PEG 3350 17 GM/SCOOP Powd Mix 17 grams (1 Capful) in 8 ounces of liquid and drink by mouth daily as needed.   potassium chloride SA 20 MEQ tablet Commonly known as: KLOR-CON M Take 1 tablet  (20 mEq total) by mouth daily.   predniSONE 50 MG tablet Commonly known as: DELTASONE TAKE 1 TABLET BY MOUTH 13 HOURS BEFORE CT SCAN, 1 TABLET 7 HOURS BEFORE CT SCAN, AND 1 TABLET 1 HOUR BEFORE CT SCAN   prochlorperazine 10 MG tablet Commonly known as: COMPAZINE Take 1 tablet (10 mg total) by mouth every 6 (six) hours as needed (Nausea or vomiting).   promethazine 25  MG tablet Commonly known as: PHENERGAN TAKE 1 TABLET BY MOUTH EVERY 6 HOURS AS NEEDED FOR NAUSEA & VOMITING   senna-docusate 8.6-50 MG tablet Commonly known as: Senokot-S Take 1 tablet by mouth at bedtime as needed for mild constipation.   tamsulosin 0.4 MG Caps capsule Commonly known as: Flomax Take 1 capsule (0.4 mg total) by mouth daily after supper.   temazepam 30 MG capsule Commonly known as: RESTORIL Take 1 capsule (30 mg total) by mouth at bedtime as needed for sleep.        Allergies:  Allergies  Allergen Reactions   Dextromethorphan-Guaifenesin Other (See Comments)    Irregular heartbeat    Doxycycline Anaphylaxis, Nausea And Vomiting, Rash and Other (See Comments)    "heart arrythmia" and "dyspepsia" (only oral doxycycline causes reaction)   Gadobutrol Hives and Other (See Comments)    Patient had MRI scan at [facility]. Patient called one hour after he left imaging facility to report two "blisters" that came up on "back" of lip.     Gadolinium Derivatives Hives    Patient had MRI scan at [facility]. Patient called one hour after he left imaging facility to report two "blisters" that came up on "back" of lip.    Guaifenesin Palpitations        Ibuprofen Hives and Itching   Lisinopril Other (See Comments)    Angioedema   Pantoprazole Itching   Tramadol Hives and Itching   Nexavar [Sorafenib] Hives   Barium Rash    Developed redness around neck after drinking 1st bottle of Barocat; pt was given Benedryl by ED Mds to be "on the safe side" before drinking 2nd bottle    Chlorhexidine Hives    Past Medical History, Surgical history, Social history, and Family History were reviewed and updated.  Review of Systems: Review of Systems  Constitutional:  Positive for malaise/fatigue.  HENT: Negative.    Eyes: Negative.   Respiratory: Negative.    Cardiovascular: Negative.   Gastrointestinal:  Positive for abdominal pain, constipation, heartburn and nausea.  Genitourinary: Negative.   Musculoskeletal:  Positive for joint pain.  Skin: Negative.   Neurological: Negative.   Endo/Heme/Allergies: Negative.   Psychiatric/Behavioral: Negative.       Physical Exam:  weight is 253 lb (114.8 kg). His oral temperature is 97.6 F (36.4 C). His blood pressure is 120/88 and his pulse is 93. His respiration is 18 and oxygen saturation is 97%.   Wt Readings from Last 3 Encounters:  09/22/21 253 lb (114.8 kg)  09/08/21 247 lb (112 kg)  08/18/21 245 lb (111.1 kg)    Physical Exam Vitals reviewed.  HENT:     Head: Normocephalic and atraumatic.  Eyes:     Pupils: Pupils are equal, round, and reactive to light.  Cardiovascular:     Heart sounds: Normal heart sounds.     Comments: Cardiac exam shows a tachycardic rate.  He has occasional extra beat.  I do not hear any obvious murmurs or rubs. Pulmonary:     Effort: Pulmonary effort is normal.     Breath sounds: Normal breath sounds.  Abdominal:     General: Bowel sounds are normal.     Palpations: Abdomen is soft.  Musculoskeletal:        General: No tenderness or deformity. Normal range of motion.     Cervical back: Normal range of motion.  Lymphadenopathy:     Cervical: No cervical adenopathy.  Skin:    General: Skin is warm   and dry.     Findings: No erythema or rash.  Neurological:     Mental Status: He is alert and oriented to person, place, and time.  Psychiatric:        Behavior: Behavior normal.        Thought Content: Thought content normal.        Judgment: Judgment normal.      Lab  Results  Component Value Date   WBC 4.2 09/22/2021   HGB 12.2 (L) 09/22/2021   HCT 39.3 09/22/2021   MCV 99.0 09/22/2021   PLT 147 (L) 09/22/2021   Lab Results  Component Value Date   FERRITIN 958 (H) 08/18/2021   IRON 108 08/18/2021   TIBC 349 08/18/2021   UIBC 241 08/18/2021   IRONPCTSAT 31 08/18/2021   Lab Results  Component Value Date   RETICCTPCT 3.9 (H) 08/08/2020   RBC 3.97 (L) 09/22/2021   No results found for: "KPAFRELGTCHN", "LAMBDASER", "KAPLAMBRATIO" No results found for: "IGGSERUM", "IGA", "IGMSERUM" No results found for: "TOTALPROTELP", "ALBUMINELP", "A1GS", "A2GS", "BETS", "BETA2SER", "GAMS", "MSPIKE", "SPEI"   Chemistry      Component Value Date/Time   NA 137 09/22/2021 0933   NA 141 02/06/2019 1210   K 4.3 09/22/2021 0933   CL 103 09/22/2021 0933   CO2 26 09/22/2021 0933   BUN 12 09/22/2021 0933   BUN 8 02/06/2019 1210   CREATININE 1.17 09/22/2021 0933      Component Value Date/Time   CALCIUM 9.5 09/22/2021 0933   ALKPHOS 117 09/22/2021 0933   AST 25 09/22/2021 0933   ALT 14 09/22/2021 0933   BILITOT 0.7 09/22/2021 0933       Impression and Plan: 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   I am just happy that his quality life is doing so well right now.  He really feels good.  He feels like he can participate in activities with his family.  I think some of this has to do the fact that we are managing his testosterone to keep that up at a good level.  He will get his sixth cycle of FOLFOX.  After 8 cycles, we will repeat her scans and see however thing looks.  I think overall, his pain is doing quite well.  He is good at managing his pain medication.  If he does need surgery for this salivary gland blockage, I do not see a problem with him having this.  We will plan to get him back in another couple weeks.   [name], MD 8/7/202310:13 AM

## 2021-09-23 ENCOUNTER — Other Ambulatory Visit (HOSPITAL_BASED_OUTPATIENT_CLINIC_OR_DEPARTMENT_OTHER): Payer: Self-pay

## 2021-09-24 ENCOUNTER — Other Ambulatory Visit: Payer: Self-pay | Admitting: Hematology & Oncology

## 2021-09-24 ENCOUNTER — Other Ambulatory Visit: Payer: Self-pay

## 2021-09-24 ENCOUNTER — Other Ambulatory Visit (HOSPITAL_BASED_OUTPATIENT_CLINIC_OR_DEPARTMENT_OTHER): Payer: Self-pay

## 2021-09-24 ENCOUNTER — Inpatient Hospital Stay: Payer: BC Managed Care – PPO

## 2021-09-24 DIAGNOSIS — C73 Malignant neoplasm of thyroid gland: Secondary | ICD-10-CM

## 2021-09-24 DIAGNOSIS — C787 Secondary malignant neoplasm of liver and intrahepatic bile duct: Secondary | ICD-10-CM

## 2021-09-24 DIAGNOSIS — M546 Pain in thoracic spine: Secondary | ICD-10-CM

## 2021-09-24 DIAGNOSIS — R110 Nausea: Secondary | ICD-10-CM

## 2021-09-24 LAB — TESTOSTERONE: Testosterone: 611 ng/dL (ref 264–916)

## 2021-09-24 MED ORDER — SODIUM CHLORIDE 0.9% FLUSH
10.0000 mL | Freq: Once | INTRAVENOUS | Status: AC
  Administered 2021-09-24: 10 mL via INTRAVENOUS

## 2021-09-24 MED ORDER — HYDROMORPHONE HCL 8 MG PO TABS
8.0000 mg | ORAL_TABLET | ORAL | 0 refills | Status: DC | PRN
  Filled 2021-09-24: qty 120, 20d supply, fill #0

## 2021-09-24 MED ORDER — HEPARIN SOD (PORK) LOCK FLUSH 100 UNIT/ML IV SOLN
500.0000 [IU] | Freq: Once | INTRAVENOUS | Status: AC
  Administered 2021-09-24: 500 [IU] via INTRAVENOUS

## 2021-09-24 MED ORDER — LORAZEPAM 1 MG PO TABS
1.0000 mg | ORAL_TABLET | Freq: Four times a day (QID) | ORAL | 0 refills | Status: DC | PRN
  Filled 2021-09-24: qty 30, 8d supply, fill #0

## 2021-09-24 NOTE — Patient Instructions (Signed)

## 2021-09-24 NOTE — Telephone Encounter (Signed)
Pt is requesting a refill on the Ativan 1 mg. Pt takes PRN for nausea and vomiting. Pt only had 0.5 mg listed on his medlist but states he also takes 1 mg too. Please advise if ok to refill, thanks!

## 2021-10-01 ENCOUNTER — Other Ambulatory Visit (HOSPITAL_BASED_OUTPATIENT_CLINIC_OR_DEPARTMENT_OTHER): Payer: Self-pay

## 2021-10-06 ENCOUNTER — Inpatient Hospital Stay: Payer: BC Managed Care – PPO

## 2021-10-06 ENCOUNTER — Other Ambulatory Visit: Payer: Self-pay

## 2021-10-06 ENCOUNTER — Inpatient Hospital Stay (HOSPITAL_BASED_OUTPATIENT_CLINIC_OR_DEPARTMENT_OTHER): Payer: BC Managed Care – PPO | Admitting: Hematology & Oncology

## 2021-10-06 ENCOUNTER — Encounter: Payer: Self-pay | Admitting: Hematology & Oncology

## 2021-10-06 VITALS — BP 121/90 | HR 103 | Temp 98.3°F | Resp 17 | Wt 255.0 lb

## 2021-10-06 DIAGNOSIS — E291 Testicular hypofunction: Secondary | ICD-10-CM

## 2021-10-06 DIAGNOSIS — C73 Malignant neoplasm of thyroid gland: Secondary | ICD-10-CM

## 2021-10-06 DIAGNOSIS — I824Z2 Acute embolism and thrombosis of unspecified deep veins of left distal lower extremity: Secondary | ICD-10-CM

## 2021-10-06 DIAGNOSIS — E038 Other specified hypothyroidism: Secondary | ICD-10-CM

## 2021-10-06 DIAGNOSIS — D509 Iron deficiency anemia, unspecified: Secondary | ICD-10-CM

## 2021-10-06 LAB — CBC WITH DIFFERENTIAL/PLATELET
Abs Immature Granulocytes: 0 10*3/uL (ref 0.00–0.07)
Basophils Absolute: 0 10*3/uL (ref 0.0–0.1)
Basophils Relative: 1 %
Eosinophils Absolute: 0.1 10*3/uL (ref 0.0–0.5)
Eosinophils Relative: 2 %
HCT: 39.6 % (ref 39.0–52.0)
Hemoglobin: 12.5 g/dL — ABNORMAL LOW (ref 13.0–17.0)
Immature Granulocytes: 0 %
Lymphocytes Relative: 11 %
Lymphs Abs: 0.3 10*3/uL — ABNORMAL LOW (ref 0.7–4.0)
MCH: 31.5 pg (ref 26.0–34.0)
MCHC: 31.6 g/dL (ref 30.0–36.0)
MCV: 99.7 fL (ref 80.0–100.0)
Monocytes Absolute: 0.5 10*3/uL (ref 0.1–1.0)
Monocytes Relative: 19 %
Neutro Abs: 1.9 10*3/uL (ref 1.7–7.7)
Neutrophils Relative %: 67 %
Platelets: 116 10*3/uL — ABNORMAL LOW (ref 150–400)
RBC: 3.97 MIL/uL — ABNORMAL LOW (ref 4.22–5.81)
RDW: 18.7 % — ABNORMAL HIGH (ref 11.5–15.5)
WBC: 2.8 10*3/uL — ABNORMAL LOW (ref 4.0–10.5)
nRBC: 0 % (ref 0.0–0.2)

## 2021-10-06 LAB — COMPREHENSIVE METABOLIC PANEL
ALT: 19 U/L (ref 0–44)
AST: 36 U/L (ref 15–41)
Albumin: 3.6 g/dL (ref 3.5–5.0)
Alkaline Phosphatase: 102 U/L (ref 38–126)
Anion gap: 8 (ref 5–15)
BUN: 9 mg/dL (ref 6–20)
CO2: 26 mmol/L (ref 22–32)
Calcium: 9.2 mg/dL (ref 8.9–10.3)
Chloride: 103 mmol/L (ref 98–111)
Creatinine, Ser: 0.88 mg/dL (ref 0.61–1.24)
GFR, Estimated: >60 mL/min (ref >60)
Glucose, Bld: 115 mg/dL — ABNORMAL HIGH (ref 70–99)
Potassium: 3.9 mmol/L (ref 3.5–5.1)
Sodium: 137 mmol/L (ref 135–145)
Total Bilirubin: 0.6 mg/dL (ref 0.3–1.2)
Total Protein: 6.5 g/dL (ref 6.5–8.1)

## 2021-10-06 LAB — LACTATE DEHYDROGENASE: LDH: 232 U/L — ABNORMAL HIGH (ref 98–192)

## 2021-10-06 MED ORDER — OXALIPLATIN CHEMO INJECTION 100 MG/20ML
68.0000 mg/m2 | Freq: Once | INTRAVENOUS | Status: AC
  Administered 2021-10-06: 160 mg via INTRAVENOUS
  Filled 2021-10-06: qty 32

## 2021-10-06 MED ORDER — HYDROMORPHONE HCL 1 MG/ML IJ SOLN
1.0000 mg | Freq: Once | INTRAMUSCULAR | Status: AC
  Administered 2021-10-06: 1 mg via INTRAVENOUS
  Filled 2021-10-06: qty 1

## 2021-10-06 MED ORDER — SODIUM CHLORIDE 0.9 % IV SOLN
10.0000 mg | Freq: Once | INTRAVENOUS | Status: AC
  Administered 2021-10-06: 10 mg via INTRAVENOUS
  Filled 2021-10-06: qty 10

## 2021-10-06 MED ORDER — PALONOSETRON HCL INJECTION 0.25 MG/5ML
0.2500 mg | Freq: Once | INTRAVENOUS | Status: AC
  Administered 2021-10-06: 0.25 mg via INTRAVENOUS
  Filled 2021-10-06: qty 5

## 2021-10-06 MED ORDER — LEUCOVORIN CALCIUM INJECTION 350 MG
400.0000 mg/m2 | Freq: Once | INTRAVENOUS | Status: AC
  Administered 2021-10-06: 936 mg via INTRAVENOUS
  Filled 2021-10-06: qty 46.8

## 2021-10-06 MED ORDER — DEXTROSE 5 % IV SOLN: Freq: Once | INTRAVENOUS | Status: AC

## 2021-10-06 MED ORDER — FLUOROURACIL CHEMO INJECTION 2.5 GM/50ML
400.0000 mg/m2 | Freq: Once | INTRAVENOUS | Status: AC
  Administered 2021-10-06: 950 mg via INTRAVENOUS
  Filled 2021-10-06: qty 19

## 2021-10-06 MED ORDER — SODIUM CHLORIDE 0.9 % IV SOLN
1920.0000 mg/m2 | INTRAVENOUS | Status: DC
  Administered 2021-10-06: 4500 mg via INTRAVENOUS
  Filled 2021-10-06: qty 90

## 2021-10-06 MED ORDER — TESTOSTERONE CYPIONATE 200 MG/ML IM SOLN
400.0000 mg | INTRAMUSCULAR | Status: DC
  Administered 2021-10-06: 400 mg via INTRAMUSCULAR
  Filled 2021-10-06: qty 2

## 2021-10-06 NOTE — Progress Notes (Signed)
Hematology and Oncology Follow Up Visit  JAHSON EMANUELE [id number] 10-Aug-1975 46 y.o. 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 + Bilateral pulmonary emboli without right heart strain  DVT left popliteal vein    Past Therapy: Cabometyx 40 mg po q day -- start on 04/17/2019 - d/c on 07/17/2019 for progression Lenvima 24 mg po q day -- on hold due to proteinuria Taxotere/CDDP -- s/p cycle 4 - started on 09/13/2019   Current Therapy:      Vinblastine q week x 4 weeks on / 1 week off -- s/p  cycle #2 -- start on 02/24/2021 -- d/c on 05/20/2021 Gavreto 400 mg po q day -- started on 05/17/2020  -- d/c on 06/16/2020 Adriamycin weekly 2 weeks on 1 week off - s/p cycle 8 --d/c on 04/16/2020 due to progression XRT for LEFT shoulder met -completed on 06/22/2021 Pembrolizumab 200 mg IV q 3 week -- s/p cycle #8 - start on 06/26/2020 -- d/c on 01/16/2021 due to progression Zometa 4 mg IV q 2 months - next dose on 10/2021 Eliquis 5 mg po BID FOLFOX -- s/p cycle #6 -- start on 07/08/2021   Interim History:  [name] is here today for follow-up.  He did have a very nice weekend.  He actually mowed his yard.  His left salivary gland is doing much better.  There is not much of a mass or now.  I think he has been seeing ENT for this.  He says his pain is under relatively good control.  He has not had problems with nausea or vomiting.  He has had no diarrhea.  He has had no rashes.  There is been no bleeding.  He is on Eliquis.  He has little bit of leg swelling but this is chronic.  I do not think there is much fluid in the legs.  He is on supplemental testosterone.  This is really helped his energy level.  His last testosterone level about 3 weeks ago was 611.  He has had a good appetite.  He has had no headache.  I have to say that overall, I would say his performance status is probably ECOG 1.      Medications:  Allergies as of  10/06/2021       Reactions   Dextromethorphan-guaifenesin Other (See Comments)   Irregular heartbeat   Doxycycline Anaphylaxis, Nausea And Vomiting, Rash, Other (See Comments)   "heart arrythmia" and "dyspepsia" (only oral doxycycline causes reaction)   Gadobutrol Hives, Other (See Comments)   Patient had MRI scan at [place]. Patient called one hour after he left imaging facility to report two "blisters" that came up on "back" of lip.    Gadolinium Derivatives Hives   Patient had MRI scan at [place]. Patient called one hour after he left imaging facility to report two "blisters" that came up on "back" of lip.    Guaifenesin Palpitations      Ibuprofen Hives, Itching   Lisinopril Other (See Comments)   Angioedema   Pantoprazole Itching   Tramadol Hives, Itching   Nexavar [sorafenib] Hives   Barium Rash   Developed redness around neck after drinking 1st bottle of Barocat; pt was given Benedryl by ED Mds to be "on the safe side" before drinking 2nd bottle   Chlorhexidine Hives        Medication List        Accurate as  of October 06, 2021  9:55 AM. If you have any questions, ask your nurse or doctor.          cyclobenzaprine 10 MG tablet Commonly known as: FLEXERIL Take 1 tablet (10 mg total) by mouth 3 (three) times daily as needed.   dexamethasone 4 MG tablet Commonly known as: DECADRON Take 2 tablets (8 mg total) by mouth daily. Start the day after chemotherapy for 2 days. Take with food.   diphenhydrAMINE 25 MG tablet Commonly known as: BENADRYL Take 2 tablets(50 mg total) by mouth one hour prior to CT Scan   Eliquis 5 MG Tabs tablet Generic drug: apixaban TAKE 1 TABLET (5 MG TOTAL) BY MOUTH 2 (TWO) TIMES DAILY.   EPINEPHrine 0.3 mg/0.3 mL Soaj injection Commonly known as: EpiPen 2-Pak USE AS DIRECTED FOR LIFE THREATENING ALLERGIC REACTIONS   famotidine 40 MG tablet Commonly known as: PEPCID Take 1 tablet (40 mg total) by mouth 2 (two)  times daily.   Fusion Plus Caps Take 1 capsule by mouth daily.   HYDROmorphone 8 MG tablet Commonly known as: DILAUDID Take 1 tablet (8 mg total) by mouth every 4 (four) hours as needed for severe pain.   levothyroxine 200 MCG tablet Commonly known as: SYNTHROID Take one tablet (200 mcg dose) by mouth daily. Total:  288 mcg/day.   lidocaine-prilocaine cream Commonly known as: EMLA Apply to affected area once   LORazepam 0.5 MG tablet Commonly known as: Ativan Take 1 tablet (0.5 mg total) by mouth every 6 (six) hours as needed (Nausea or vomiting).   LORazepam 1 MG tablet Commonly known as: ATIVAN Take 1 tablet (1 mg total) by mouth every 6 (six) hours as needed for anxiety (for nausea and vomiting).   methylphenidate 10 MG tablet Commonly known as: RITALIN Take 1 tablet (10 mg total) by mouth 2 (two) times daily.   metoCLOPramide 10 MG tablet Commonly known as: REGLAN Take 1 tablet (10 mg total) by mouth every 6 (six) hours as needed for nausea or vomiting.   metoprolol tartrate 50 MG tablet Commonly known as: Lopressor Take 1 tablet (50 mg total) by mouth 2 (two) times daily.   multivitamin capsule Take 1 capsule by mouth daily.   OLANZapine 10 MG tablet Commonly known as: ZYPREXA TAKE 1 TABLET (10 MG TOTAL) BY MOUTH AT BEDTIME.   ondansetron 8 MG tablet Commonly known as: Zofran Take 1 tablet (8 mg total) by mouth 2 (two) times daily as needed for refractory nausea / vomiting. Start on day 3 after chemotherapy.   PEG 3350 17 GM/SCOOP Powd Mix 17 grams (1 Capful) in 8 ounces of liquid and drink by mouth daily as needed.   potassium chloride SA 20 MEQ tablet Commonly known as: KLOR-CON M Take 1 tablet (20 mEq total) by mouth daily.   predniSONE 50 MG tablet Commonly known as: DELTASONE TAKE 1 TABLET BY MOUTH 13 HOURS BEFORE CT SCAN, 1 TABLET 7 HOURS BEFORE CT SCAN, AND 1 TABLET 1 HOUR BEFORE CT SCAN   prochlorperazine 10 MG tablet Commonly known as:  COMPAZINE Take 1 tablet (10 mg total) by mouth every 6 (six) hours as needed (Nausea or vomiting).   promethazine 25 MG tablet Commonly known as: PHENERGAN TAKE 1 TABLET BY MOUTH EVERY 6 HOURS AS NEEDED FOR NAUSEA & VOMITING   senna-docusate 8.6-50 MG tablet Commonly known as: Senokot-S Take 1 tablet by mouth at bedtime as needed for mild constipation.   tamsulosin 0.4 MG Caps capsule Commonly known  as: Flomax Take 1 capsule (0.4 mg total) by mouth daily after supper.   temazepam 30 MG capsule Commonly known as: RESTORIL Take 1 capsule (30 mg total) by mouth at bedtime as needed for sleep.   Xtampza ER 36 MG C12a Generic drug: oxyCODONE ER Take 1 capsule (36 mg total) by mouth every 12 (twelve) hours        Allergies:  Allergies  Allergen Reactions   Dextromethorphan-Guaifenesin Other (See Comments)    Irregular heartbeat    Doxycycline Anaphylaxis, Nausea And Vomiting, Rash and Other (See Comments)    "heart arrythmia" and "dyspepsia" (only oral doxycycline causes reaction)   Gadobutrol Hives and Other (See Comments)    Patient had MRI scan at [place]. Patient called one hour after he left imaging facility to report two "blisters" that came up on "back" of lip.     Gadolinium Derivatives Hives    Patient had MRI scan at [place]. Patient called one hour after he left imaging facility to report two "blisters" that came up on "back" of lip.    Guaifenesin Palpitations        Ibuprofen Hives and Itching   Lisinopril Other (See Comments)    Angioedema   Pantoprazole Itching   Tramadol Hives and Itching   Nexavar [Sorafenib] Hives   Barium Rash    Developed redness around neck after drinking 1st bottle of Barocat; pt was given Benedryl by ED Mds to be "on the safe side" before drinking 2nd bottle   Chlorhexidine Hives    Past Medical History, Surgical history, Social history, and Family History were reviewed and updated.  Review of  Systems: Review of Systems  Constitutional:  Positive for malaise/fatigue.  HENT: Negative.    Eyes: Negative.   Respiratory: Negative.    Cardiovascular: Negative.   Gastrointestinal:  Positive for abdominal pain, constipation, heartburn and nausea.  Genitourinary: Negative.   Musculoskeletal:  Positive for joint pain.  Skin: Negative.   Neurological: Negative.   Endo/Heme/Allergies: Negative.   Psychiatric/Behavioral: Negative.       Physical Exam:  weight is 255 lb (115.7 kg). His oral temperature is 98.3 F (36.8 C). His blood pressure is 121/90 (abnormal) and his pulse is 103 (abnormal). His respiration is 17 and oxygen saturation is 97%.   Wt Readings from Last 3 Encounters:  10/06/21 255 lb (115.7 kg)  09/22/21 253 lb (114.8 kg)  09/08/21 247 lb (112 kg)    Physical Exam Vitals reviewed.  HENT:     Head: Normocephalic and atraumatic.  Eyes:     Pupils: Pupils are equal, round, and reactive to light.  Cardiovascular:     Heart sounds: Normal heart sounds.     Comments: Cardiac exam shows a tachycardic rate.  He has occasional extra beat.  I do not hear any obvious murmurs or rubs. Pulmonary:     Effort: Pulmonary effort is normal.     Breath sounds: Normal breath sounds.  Abdominal:     General: Bowel sounds are normal.     Palpations: Abdomen is soft.  Musculoskeletal:        General: No tenderness or deformity. Normal range of motion.     Cervical back: Normal range of motion.  Lymphadenopathy:     Cervical: No cervical adenopathy.  Skin:    General: Skin is warm and dry.     Findings: No erythema or rash.  Neurological:     Mental Status: He is alert and oriented to person,  place, and time.  Psychiatric:        Behavior: Behavior normal.        Thought Content: Thought content normal.        Judgment: Judgment normal.      Lab Results  Component Value Date   WBC 2.8 (L) 10/06/2021   HGB 12.5 (L) 10/06/2021   HCT 39.6 10/06/2021   MCV 99.7  10/06/2021   PLT 116 (L) 10/06/2021   Lab Results  Component Value Date   FERRITIN 958 (H) 08/18/2021   IRON 108 08/18/2021   TIBC 349 08/18/2021   UIBC 241 08/18/2021   IRONPCTSAT 31 08/18/2021   Lab Results  Component Value Date   RETICCTPCT 3.9 (H) 08/08/2020   RBC 3.97 (L) 10/06/2021   No results found for: "KPAFRELGTCHN", "LAMBDASER", "KAPLAMBRATIO" No results found for: "IGGSERUM", "IGA", "IGMSERUM" No results found for: "TOTALPROTELP", "ALBUMINELP", "A1GS", "A2GS", "BETS", "BETA2SER", "GAMS", "MSPIKE", "SPEI"   Chemistry      Component Value Date/Time   NA 137 10/06/2021 0835   NA 141 02/06/2019 1210   K 3.9 10/06/2021 0835   CL 103 10/06/2021 0835   CO2 26 10/06/2021 0835   BUN 9 10/06/2021 0835   BUN 8 02/06/2019 1210   CREATININE 0.88 10/06/2021 0835   CREATININE 1.17 09/22/2021 0933      Component Value Date/Time   CALCIUM 9.2 10/06/2021 0835   ALKPHOS 102 10/06/2021 0835   AST 36 10/06/2021 0835   AST 25 09/22/2021 0933   ALT 19 10/06/2021 0835   ALT 14 09/22/2021 0933   BILITOT 0.6 10/06/2021 0835   BILITOT 0.7 09/22/2021 0933       Impression and Plan: 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   I am just happy that his quality life is doing so well right now.  He really feels good.  He feels like he can participate in activities with his family.  I think some of this has to do the fact that we are managing his testosterone to keep that up at a good level.  We will go ahead with his seventh cycle of treatment.  After the eighth cycle, we will then rescan him.  Again, I am just happy that his quality of life is doing so well right now.  We will plan to get her back in another couple weeks.   [name], MD 8/21/20239:55 AM

## 2021-10-06 NOTE — Patient Instructions (Signed)

## 2021-10-06 NOTE — Patient Instructions (Signed)
[facility]  Discharge Instructions: Thank you for choosing [facility] to provide your oncology and hematology care.   If you have a lab appointment with the [facility], please go directly to the [facility] and check in at the registration area.  Wear comfortable clothing and clothing appropriate for easy access to any Portacath or PICC line.   We strive to give you quality time with your provider. You may need to reschedule your appointment if you arrive late (15 or more minutes).  Arriving late affects you and other patients whose appointments are after yours.  Also, if you miss three or more appointments without notifying the office, you may be dismissed from the clinic at the provider's discretion.      For prescription refill requests, have your pharmacy contact our office and allow 72 hours for refills to be completed.    Today you received the following chemotherapy and/or immunotherapy agents:  Oxaliplatin, Leucovorin and 5FU.       To help prevent nausea and vomiting after your treatment, we encourage you to take your nausea medication as directed.  BELOW ARE SYMPTOMS THAT SHOULD BE REPORTED IMMEDIATELY: *FEVER GREATER THAN 100.4 F (38 C) OR HIGHER *CHILLS OR SWEATING *NAUSEA AND VOMITING THAT IS NOT CONTROLLED WITH YOUR NAUSEA MEDICATION *UNUSUAL SHORTNESS OF BREATH *UNUSUAL BRUISING OR BLEEDING *URINARY PROBLEMS (pain or burning when urinating, or frequent urination) *BOWEL PROBLEMS (unusual diarrhea, constipation, pain near the anus) TENDERNESS IN MOUTH AND THROAT WITH OR WITHOUT PRESENCE OF ULCERS (sore throat, sores in mouth, or a toothache) UNUSUAL RASH, SWELLING OR PAIN  UNUSUAL VAGINAL DISCHARGE OR ITCHING   Items with * indicate a potential emergency and should be followed up as soon as possible or go to the Emergency Department if any problems should occur.  Please show the CHEMOTHERAPY ALERT CARD or IMMUNOTHERAPY ALERT  CARD at check-in to the Emergency Department and triage nurse. Should you have questions after your visit or need to cancel or reschedule your appointment, please contact [facility]  [phone] and follow the prompts.  Office hours are 8:00 a.m. to 4:30 p.m. Monday - Friday. Please note that voicemails left after 4:00 p.m. may not be returned until the following business day.  We are closed weekends and major holidays. You have access to a nurse at all times for urgent questions. Please call the main number to the clinic [phone] and follow the prompts.  For any non-urgent questions, you may also contact your provider using MyChart. We now offer e-Visits for anyone 46 and older to request care online for non-urgent symptoms. For details visit [email or web address].   Also download the MyChart app! Go to the app store, search "MyChart", open the app, select [facility], and log in with your MyChart username and password.  Masks are optional in the cancer centers. If you would like for your care team to wear a mask while they are taking care of you, please let them know. For doctor visits, patients may have with them one support person who is at least 46 years old. At this time, visitors are not allowed in the infusion area.

## 2021-10-06 NOTE — Progress Notes (Signed)
Per Dr. Marin Olp ok to treat with HR 103

## 2021-10-07 LAB — TESTOSTERONE: Testosterone: 695 ng/dL (ref 264–916)

## 2021-10-08 ENCOUNTER — Inpatient Hospital Stay: Payer: BC Managed Care – PPO

## 2021-10-08 ENCOUNTER — Other Ambulatory Visit: Payer: Self-pay

## 2021-10-08 VITALS — BP 121/75 | HR 100 | Temp 98.9°F | Resp 16

## 2021-10-08 DIAGNOSIS — C73 Malignant neoplasm of thyroid gland: Secondary | ICD-10-CM | POA: Diagnosis not present

## 2021-10-08 MED ORDER — HEPARIN SOD (PORK) LOCK FLUSH 100 UNIT/ML IV SOLN
500.0000 [IU] | Freq: Once | INTRAVENOUS | Status: AC | PRN
  Administered 2021-10-08: 500 [IU]

## 2021-10-08 MED ORDER — SODIUM CHLORIDE 0.9% FLUSH
10.0000 mL | INTRAVENOUS | Status: DC | PRN
  Administered 2021-10-08: 10 mL

## 2021-10-08 NOTE — Patient Instructions (Signed)

## 2021-10-15 ENCOUNTER — Other Ambulatory Visit (HOSPITAL_BASED_OUTPATIENT_CLINIC_OR_DEPARTMENT_OTHER): Payer: Self-pay

## 2021-10-17 ENCOUNTER — Encounter: Payer: Self-pay | Admitting: Family

## 2021-10-17 ENCOUNTER — Other Ambulatory Visit: Payer: Self-pay | Admitting: Hematology & Oncology

## 2021-10-17 ENCOUNTER — Encounter: Payer: Self-pay | Admitting: Hematology & Oncology

## 2021-10-17 ENCOUNTER — Other Ambulatory Visit (HOSPITAL_BASED_OUTPATIENT_CLINIC_OR_DEPARTMENT_OTHER): Payer: Self-pay

## 2021-10-17 DIAGNOSIS — M546 Pain in thoracic spine: Secondary | ICD-10-CM

## 2021-10-17 DIAGNOSIS — G893 Neoplasm related pain (acute) (chronic): Secondary | ICD-10-CM

## 2021-10-17 DIAGNOSIS — C787 Secondary malignant neoplasm of liver and intrahepatic bile duct: Secondary | ICD-10-CM

## 2021-10-17 DIAGNOSIS — C73 Malignant neoplasm of thyroid gland: Secondary | ICD-10-CM

## 2021-10-17 MED ORDER — PROCHLORPERAZINE MALEATE 10 MG PO TABS
10.0000 mg | ORAL_TABLET | Freq: Four times a day (QID) | ORAL | 1 refills | Status: DC | PRN
  Filled 2021-10-17: qty 30, 8d supply, fill #0
  Filled 2021-11-04: qty 30, 8d supply, fill #1

## 2021-10-17 MED ORDER — METHYLPHENIDATE HCL 10 MG PO TABS
10.0000 mg | ORAL_TABLET | Freq: Two times a day (BID) | ORAL | 0 refills | Status: DC
  Filled 2021-10-17: qty 60, 30d supply, fill #0

## 2021-10-17 MED ORDER — XTAMPZA ER 36 MG PO C12A
36.0000 mg | EXTENDED_RELEASE_CAPSULE | Freq: Two times a day (BID) | ORAL | 0 refills | Status: DC
  Filled 2021-10-17: qty 60, 30d supply, fill #0

## 2021-10-21 ENCOUNTER — Other Ambulatory Visit: Payer: Self-pay

## 2021-10-21 ENCOUNTER — Inpatient Hospital Stay: Payer: BC Managed Care – PPO

## 2021-10-21 ENCOUNTER — Inpatient Hospital Stay (HOSPITAL_BASED_OUTPATIENT_CLINIC_OR_DEPARTMENT_OTHER): Payer: BC Managed Care – PPO | Admitting: Hematology & Oncology

## 2021-10-21 ENCOUNTER — Other Ambulatory Visit (HOSPITAL_BASED_OUTPATIENT_CLINIC_OR_DEPARTMENT_OTHER): Payer: Self-pay

## 2021-10-21 ENCOUNTER — Encounter: Payer: Self-pay | Admitting: Hematology & Oncology

## 2021-10-21 ENCOUNTER — Inpatient Hospital Stay: Payer: BC Managed Care – PPO | Attending: Hematology & Oncology

## 2021-10-21 VITALS — BP 127/87 | HR 89 | Temp 97.8°F | Resp 18 | Ht 70.0 in | Wt 260.0 lb

## 2021-10-21 DIAGNOSIS — C7951 Secondary malignant neoplasm of bone: Secondary | ICD-10-CM | POA: Insufficient documentation

## 2021-10-21 DIAGNOSIS — E038 Other specified hypothyroidism: Secondary | ICD-10-CM

## 2021-10-21 DIAGNOSIS — C73 Malignant neoplasm of thyroid gland: Secondary | ICD-10-CM | POA: Insufficient documentation

## 2021-10-21 DIAGNOSIS — Z5111 Encounter for antineoplastic chemotherapy: Secondary | ICD-10-CM | POA: Diagnosis present

## 2021-10-21 DIAGNOSIS — Z79899 Other long term (current) drug therapy: Secondary | ICD-10-CM | POA: Diagnosis not present

## 2021-10-21 DIAGNOSIS — E291 Testicular hypofunction: Secondary | ICD-10-CM | POA: Diagnosis not present

## 2021-10-21 DIAGNOSIS — C787 Secondary malignant neoplasm of liver and intrahepatic bile duct: Secondary | ICD-10-CM | POA: Insufficient documentation

## 2021-10-21 DIAGNOSIS — I824Z2 Acute embolism and thrombosis of unspecified deep veins of left distal lower extremity: Secondary | ICD-10-CM

## 2021-10-21 LAB — CMP (CANCER CENTER ONLY)
ALT: 16 U/L (ref 0–44)
AST: 29 U/L (ref 15–41)
Albumin: 3.9 g/dL (ref 3.5–5.0)
Alkaline Phosphatase: 87 U/L (ref 38–126)
Anion gap: 6 (ref 5–15)
BUN: 17 mg/dL (ref 6–20)
CO2: 30 mmol/L (ref 22–32)
Calcium: 9.8 mg/dL (ref 8.9–10.3)
Chloride: 102 mmol/L (ref 98–111)
Creatinine: 1.09 mg/dL (ref 0.61–1.24)
GFR, Estimated: >60 mL/min (ref >60)
Glucose, Bld: 118 mg/dL — ABNORMAL HIGH (ref 70–99)
Potassium: 4.1 mmol/L (ref 3.5–5.1)
Sodium: 138 mmol/L (ref 135–145)
Total Bilirubin: 0.9 mg/dL (ref 0.3–1.2)
Total Protein: 6.2 g/dL — ABNORMAL LOW (ref 6.5–8.1)

## 2021-10-21 LAB — CBC WITH DIFFERENTIAL (CANCER CENTER ONLY)
Abs Immature Granulocytes: 0.01 10*3/uL (ref 0.00–0.07)
Basophils Absolute: 0 10*3/uL (ref 0.0–0.1)
Basophils Relative: 1 %
Eosinophils Absolute: 0 10*3/uL (ref 0.0–0.5)
Eosinophils Relative: 1 %
HCT: 39.2 % (ref 39.0–52.0)
Hemoglobin: 12.3 g/dL — ABNORMAL LOW (ref 13.0–17.0)
Immature Granulocytes: 1 %
Lymphocytes Relative: 12 %
Lymphs Abs: 0.3 10*3/uL — ABNORMAL LOW (ref 0.7–4.0)
MCH: 31.8 pg (ref 26.0–34.0)
MCHC: 31.4 g/dL (ref 30.0–36.0)
MCV: 101.3 fL — ABNORMAL HIGH (ref 80.0–100.0)
Monocytes Absolute: 0.6 10*3/uL (ref 0.1–1.0)
Monocytes Relative: 27 %
Neutro Abs: 1.3 10*3/uL — ABNORMAL LOW (ref 1.7–7.7)
Neutrophils Relative %: 58 %
Platelet Count: 127 10*3/uL — ABNORMAL LOW (ref 150–400)
RBC: 3.87 MIL/uL — ABNORMAL LOW (ref 4.22–5.81)
RDW: 18.2 % — ABNORMAL HIGH (ref 11.5–15.5)
WBC Count: 2.2 10*3/uL — ABNORMAL LOW (ref 4.0–10.5)
nRBC: 0 % (ref 0.0–0.2)

## 2021-10-21 LAB — IRON AND IRON BINDING CAPACITY (CC-WL,HP ONLY)
Iron: 100 ug/dL (ref 45–182)
Saturation Ratios: 29 % (ref 17.9–39.5)
TIBC: 344 ug/dL (ref 250–450)
UIBC: 244 ug/dL (ref 117–376)

## 2021-10-21 LAB — FERRITIN: Ferritin: 483 ng/mL — ABNORMAL HIGH (ref 24–336)

## 2021-10-21 LAB — LACTATE DEHYDROGENASE: LDH: 225 U/L — ABNORMAL HIGH (ref 98–192)

## 2021-10-21 LAB — TSH: TSH: 24.384 u[IU]/mL — ABNORMAL HIGH (ref 0.350–4.500)

## 2021-10-21 MED ORDER — DEXTROSE 5 % IV SOLN: Freq: Once | INTRAVENOUS | Status: DC

## 2021-10-21 MED ORDER — SODIUM CHLORIDE 0.9 % IV SOLN
10.0000 mg | Freq: Once | INTRAVENOUS | Status: AC
  Administered 2021-10-21: 10 mg via INTRAVENOUS
  Filled 2021-10-21: qty 10

## 2021-10-21 MED ORDER — LEUCOVORIN CALCIUM INJECTION 350 MG
400.0000 mg/m2 | Freq: Once | INTRAVENOUS | Status: AC
  Administered 2021-10-21: 936 mg via INTRAVENOUS
  Filled 2021-10-21: qty 46.8

## 2021-10-21 MED ORDER — SODIUM CHLORIDE 0.9 % IV SOLN: Freq: Once | INTRAVENOUS | Status: AC

## 2021-10-21 MED ORDER — SODIUM CHLORIDE 0.9 % IV SOLN
1920.0000 mg/m2 | INTRAVENOUS | Status: DC
  Administered 2021-10-21: 4500 mg via INTRAVENOUS
  Filled 2021-10-21: qty 90

## 2021-10-21 MED ORDER — PALONOSETRON HCL INJECTION 0.25 MG/5ML
0.2500 mg | Freq: Once | INTRAVENOUS | Status: AC
  Administered 2021-10-21: 0.25 mg via INTRAVENOUS
  Filled 2021-10-21: qty 5

## 2021-10-21 MED ORDER — OXALIPLATIN CHEMO INJECTION 100 MG/20ML
68.0000 mg/m2 | Freq: Once | INTRAVENOUS | Status: AC
  Administered 2021-10-21: 160 mg via INTRAVENOUS
  Filled 2021-10-21: qty 32

## 2021-10-21 MED ORDER — DEXTROSE 5 % IV SOLN: Freq: Once | INTRAVENOUS | Status: AC

## 2021-10-21 MED ORDER — FLUOROURACIL CHEMO INJECTION 2.5 GM/50ML
400.0000 mg/m2 | Freq: Once | INTRAVENOUS | Status: AC
  Administered 2021-10-21: 950 mg via INTRAVENOUS
  Filled 2021-10-21: qty 19

## 2021-10-21 NOTE — Progress Notes (Signed)
Per Dr. Marin Olp, it's OK to treat today with today's ANC. Pharmacy notified. Infusion room nurse notified.

## 2021-10-21 NOTE — Patient Instructions (Signed)

## 2021-10-21 NOTE — Progress Notes (Signed)
Hematology and Oncology Follow Up Visit  MARTEZE VECCHIO [id number] 1975-04-23 46 y.o. 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 + Bilateral pulmonary emboli without right heart strain  DVT left popliteal vein    Past Therapy: Cabometyx 40 mg po q day -- start on 04/17/2019 - d/c on 07/17/2019 for progression Lenvima 24 mg po q day -- on hold due to proteinuria Taxotere/CDDP -- s/p cycle 4 - started on 09/13/2019   Current Therapy:      Vinblastine q week x 4 weeks on / 1 week off -- s/p  cycle #2 -- start on 02/24/2021 -- d/c on 05/20/2021 Gavreto 400 mg po q day -- started on 05/17/2020  -- d/c on 06/16/2020 Adriamycin weekly 2 weeks on 1 week off - s/p cycle 8 --d/c on 04/16/2020 due to progression XRT for LEFT shoulder met -completed on 06/22/2021 Pembrolizumab 200 mg IV q 3 week -- s/p cycle #8 - start on 06/26/2020 -- d/c on 01/16/2021 due to progression Zometa 4 mg IV q 2 months - next dose on 10/2021 Eliquis 5 mg po BID FOLFOX -- s/p cycle #7 -- start on 07/08/2021   Interim History:  [name] is here today for follow-up.  He had a very nice Labor Day weekend.  He was with his family.  He actually mowed the yard.  Home that he is able to have a good quality of life.  Clearly, this chemotherapy definitely has helped.  He has had no problems with nausea or vomiting.  He has had a little bit of pain over on the right side.  He is on a relatively good pain regimen.  He has had no change in bowel or bladder habits.  He said he did develop a small blister on his lip.  I told him to try some over-the-counter Abreva if this happen again.  He has had no tingling in the hands or feet.  He has had no leg swelling.  He has had no bleeding.  The testosterone clearly is helped some.  His last testosterone level back in August was 700.  He has had no cough or shortness of breath.  He has had no headache.  He is on  Eliquis.  There is been no bleeding with Eliquis.  Overall, I would say his performance status is probably ECOG 1.    Medications:  Allergies as of 10/21/2021       Reactions   Dextromethorphan-guaifenesin Other (See Comments)   Irregular heartbeat   Doxycycline Anaphylaxis, Nausea And Vomiting, Rash, Other (See Comments)   "heart arrythmia" and "dyspepsia" (only oral doxycycline causes reaction)   Gadobutrol Hives, Other (See Comments)   Patient had MRI scan at [facility]. Patient called one hour after he left imaging facility to report two "blisters" that came up on "back" of lip.    Gadolinium Derivatives Hives   Patient had MRI scan at [facility]. Patient called one hour after he left imaging facility to report two "blisters" that came up on "back" of lip.    Guaifenesin Palpitations      Ibuprofen Hives, Itching   Lisinopril Other (See Comments)   Angioedema   Pantoprazole Itching   Tramadol Hives, Itching   Nexavar [sorafenib] Hives   Barium Rash   Developed redness around neck after drinking 1st bottle of Barocat; pt was given Benedryl by ED Mds to be "on the safe side" before  drinking 2nd bottle   Chlorhexidine Hives        Medication List        Accurate as of October 21, 2021  9:17 AM. If you have any questions, ask your nurse or doctor.          cyclobenzaprine 10 MG tablet Commonly known as: FLEXERIL Take 1 tablet (10 mg total) by mouth 3 (three) times daily as needed.   dexamethasone 4 MG tablet Commonly known as: DECADRON Take 2 tablets (8 mg total) by mouth daily. Start the day after chemotherapy for 2 days. Take with food.   diphenhydrAMINE 25 MG tablet Commonly known as: BENADRYL Take 2 tablets(50 mg total) by mouth one hour prior to CT Scan   Eliquis 5 MG Tabs tablet Generic drug: apixaban TAKE 1 TABLET (5 MG TOTAL) BY MOUTH 2 (TWO) TIMES DAILY.   EPINEPHrine 0.3 mg/0.3 mL Soaj injection Commonly known as: EpiPen 2-Pak USE AS  DIRECTED FOR LIFE THREATENING ALLERGIC REACTIONS   famotidine 40 MG tablet Commonly known as: PEPCID Take 1 tablet (40 mg total) by mouth 2 (two) times daily.   Fusion Plus Caps Take 1 capsule by mouth daily.   HYDROmorphone 8 MG tablet Commonly known as: DILAUDID Take 1 tablet (8 mg total) by mouth every 4 (four) hours as needed for severe pain.   levothyroxine 200 MCG tablet Commonly known as: SYNTHROID Take one tablet (200 mcg dose) by mouth daily. Total:  288 mcg/day.   lidocaine-prilocaine cream Commonly known as: EMLA Apply to affected area once   LORazepam 0.5 MG tablet Commonly known as: Ativan Take 1 tablet (0.5 mg total) by mouth every 6 (six) hours as needed (Nausea or vomiting).   LORazepam 1 MG tablet Commonly known as: ATIVAN Take 1 tablet (1 mg total) by mouth every 6 (six) hours as needed for anxiety (for nausea and vomiting).   methylphenidate 10 MG tablet Commonly known as: RITALIN Take 1 tablet (10 mg total) by mouth 2 (two) times daily.   metoCLOPramide 10 MG tablet Commonly known as: REGLAN Take 1 tablet (10 mg total) by mouth every 6 (six) hours as needed for nausea or vomiting.   metoprolol tartrate 50 MG tablet Commonly known as: Lopressor Take 1 tablet (50 mg total) by mouth 2 (two) times daily.   multivitamin capsule Take 1 capsule by mouth daily.   OLANZapine 10 MG tablet Commonly known as: ZYPREXA TAKE 1 TABLET (10 MG TOTAL) BY MOUTH AT BEDTIME.   ondansetron 8 MG tablet Commonly known as: Zofran Take 1 tablet (8 mg total) by mouth 2 (two) times daily as needed for refractory nausea / vomiting. Start on day 3 after chemotherapy.   PEG 3350 17 GM/SCOOP Powd Mix 17 grams (1 Capful) in 8 ounces of liquid and drink by mouth daily as needed.   potassium chloride SA 20 MEQ tablet Commonly known as: KLOR-CON M Take 1 tablet (20 mEq total) by mouth daily.   predniSONE 50 MG tablet Commonly known as: DELTASONE TAKE 1 TABLET BY MOUTH 13  HOURS BEFORE CT SCAN, 1 TABLET 7 HOURS BEFORE CT SCAN, AND 1 TABLET 1 HOUR BEFORE CT SCAN   prochlorperazine 10 MG tablet Commonly known as: COMPAZINE Take 1 tablet (10 mg total) by mouth every 6 (six) hours as needed (Nausea or vomiting).   promethazine 25 MG tablet Commonly known as: PHENERGAN TAKE 1 TABLET BY MOUTH EVERY 6 HOURS AS NEEDED FOR NAUSEA & VOMITING   senna-docusate 8.6-50 MG tablet  Commonly known as: Senokot-S Take 1 tablet by mouth at bedtime as needed for mild constipation.   tamsulosin 0.4 MG Caps capsule Commonly known as: Flomax Take 1 capsule (0.4 mg total) by mouth daily after supper.   temazepam 30 MG capsule Commonly known as: RESTORIL Take 1 capsule (30 mg total) by mouth at bedtime as needed for sleep.   Xtampza ER 36 MG C12a Generic drug: oxyCODONE ER Take 1 capsule (36 mg total) by mouth every 12 (twelve) hours        Allergies:  Allergies  Allergen Reactions   Dextromethorphan-Guaifenesin Other (See Comments)    Irregular heartbeat    Doxycycline Anaphylaxis, Nausea And Vomiting, Rash and Other (See Comments)    "heart arrythmia" and "dyspepsia" (only oral doxycycline causes reaction)   Gadobutrol Hives and Other (See Comments)    Patient had MRI scan at [facility]. Patient called one hour after he left imaging facility to report two "blisters" that came up on "back" of lip.     Gadolinium Derivatives Hives    Patient had MRI scan at [place]. Patient called one hour after he left imaging facility to report two "blisters" that came up on "back" of lip.    Guaifenesin Palpitations        Ibuprofen Hives and Itching   Lisinopril Other (See Comments)    Angioedema   Pantoprazole Itching   Tramadol Hives and Itching   Nexavar [Sorafenib] Hives   Barium Rash    Developed redness around neck after drinking 1st bottle of Barocat; pt was given Benedryl by ED Mds to be "on the safe side" before drinking 2nd bottle    Chlorhexidine Hives    Past Medical History, Surgical history, Social history, and Family History were reviewed and updated.  Review of Systems: Review of Systems  Constitutional:  Positive for malaise/fatigue.  HENT: Negative.    Eyes: Negative.   Respiratory: Negative.    Cardiovascular: Negative.   Gastrointestinal:  Positive for abdominal pain, constipation, heartburn and nausea.  Genitourinary: Negative.   Musculoskeletal:  Positive for joint pain.  Skin: Negative.   Neurological: Negative.   Endo/Heme/Allergies: Negative.   Psychiatric/Behavioral: Negative.       Physical Exam:  vitals were not taken for this visit.   Wt Readings from Last 3 Encounters:  10/06/21 255 lb (115.7 kg)  09/22/21 253 lb (114.8 kg)  09/08/21 247 lb (112 kg)    Physical Exam Vitals reviewed.  HENT:     Head: Normocephalic and atraumatic.  Eyes:     Pupils: Pupils are equal, round, and reactive to light.  Cardiovascular:     Heart sounds: Normal heart sounds.     Comments: Cardiac exam shows a tachycardic rate.  He has occasional extra beat.  I do not hear any obvious murmurs or rubs. Pulmonary:     Effort: Pulmonary effort is normal.     Breath sounds: Normal breath sounds.  Abdominal:     General: Bowel sounds are normal.     Palpations: Abdomen is soft.  Musculoskeletal:        General: No tenderness or deformity. Normal range of motion.     Cervical back: Normal range of motion.  Lymphadenopathy:     Cervical: No cervical adenopathy.  Skin:    General: Skin is warm and dry.     Findings: No erythema or rash.  Neurological:     Mental Status: He is alert and oriented to person, place, and time.  Psychiatric:        Behavior: Behavior normal.        Thought Content: Thought content normal.        Judgment: Judgment normal.      Lab Results  Component Value Date   WBC 2.2 (L) 10/21/2021   HGB 12.3 (L) 10/21/2021   HCT 39.2 10/21/2021   MCV 101.3 (H) 10/21/2021   PLT  127 (L) 10/21/2021   Lab Results  Component Value Date   FERRITIN 958 (H) 08/18/2021   IRON 108 08/18/2021   TIBC 349 08/18/2021   UIBC 241 08/18/2021   IRONPCTSAT 31 08/18/2021   Lab Results  Component Value Date   RETICCTPCT 3.9 (H) 08/08/2020   RBC 3.87 (L) 10/21/2021   No results found for: "KPAFRELGTCHN", "LAMBDASER", "KAPLAMBRATIO" No results found for: "IGGSERUM", "IGA", "IGMSERUM" No results found for: "TOTALPROTELP", "ALBUMINELP", "A1GS", "A2GS", "BETS", "BETA2SER", "GAMS", "MSPIKE", "SPEI"   Chemistry      Component Value Date/Time   NA 137 10/06/2021 0835   NA 141 02/06/2019 1210   K 3.9 10/06/2021 0835   CL 103 10/06/2021 0835   CO2 26 10/06/2021 0835   BUN 9 10/06/2021 0835   BUN 8 02/06/2019 1210   CREATININE 0.88 10/06/2021 0835   CREATININE 1.17 09/22/2021 0933      Component Value Date/Time   CALCIUM 9.2 10/06/2021 0835   ALKPHOS 102 10/06/2021 0835   AST 36 10/06/2021 0835   AST 25 09/22/2021 0933   ALT 19 10/06/2021 0835   ALT 14 09/22/2021 0933   BILITOT 0.6 10/06/2021 0835   BILITOT 0.7 09/22/2021 0933       Impression and Plan: 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   I am just happy that his quality life is doing so well right now.  He really feels good.  He feels like he can participate in activities with his family.  We will go with his eighth cycle of treatment.  After this, we will then scan him.  Hopefully, we will see that his disease is at least stable.  The fact that his LFTs are normal should be a good indicator that his disease is holding pretty steady.  Again, I was happy that he feels good.  I am sure that the testosterone that we give does seem to help him.  We will plan to get him back in 3 weeks.  Always give him an extra week off after each 4th cycle.   [name], MD 9/5/20239:17 AM

## 2021-10-22 LAB — TESTOSTERONE: Testosterone: 634 ng/dL (ref 264–916)

## 2021-10-22 NOTE — Progress Notes (Signed)
PEt scan P2P done and denied, appeal started. Debbie from appeal dept called and asked for any additional information such as notes, scans and letter be faxed by end of day Thursday for determination.  Letter, notes and last scan faxed appeal to [phone], contact is Jackelyn Poling [phone] ext [phone]

## 2021-10-23 ENCOUNTER — Other Ambulatory Visit (HOSPITAL_BASED_OUTPATIENT_CLINIC_OR_DEPARTMENT_OTHER): Payer: Self-pay

## 2021-10-23 ENCOUNTER — Inpatient Hospital Stay: Payer: BC Managed Care – PPO

## 2021-10-23 VITALS — BP 121/81 | HR 90 | Temp 97.8°F | Resp 20

## 2021-10-23 DIAGNOSIS — E291 Testicular hypofunction: Secondary | ICD-10-CM

## 2021-10-23 DIAGNOSIS — C73 Malignant neoplasm of thyroid gland: Secondary | ICD-10-CM | POA: Diagnosis not present

## 2021-10-23 DIAGNOSIS — D509 Iron deficiency anemia, unspecified: Secondary | ICD-10-CM

## 2021-10-23 DIAGNOSIS — C799 Secondary malignant neoplasm of unspecified site: Secondary | ICD-10-CM

## 2021-10-23 MED ORDER — SODIUM CHLORIDE 0.9% FLUSH
10.0000 mL | INTRAVENOUS | Status: DC | PRN
  Administered 2021-10-23: 10 mL

## 2021-10-23 MED ORDER — HEPARIN SOD (PORK) LOCK FLUSH 100 UNIT/ML IV SOLN
500.0000 [IU] | Freq: Once | INTRAVENOUS | Status: AC | PRN
  Administered 2021-10-23: 500 [IU]

## 2021-10-23 MED ORDER — TESTOSTERONE CYPIONATE 200 MG/ML IM SOLN
400.0000 mg | INTRAMUSCULAR | Status: DC
  Administered 2021-10-23: 400 mg via INTRAMUSCULAR
  Filled 2021-10-23: qty 2

## 2021-10-23 NOTE — Patient Instructions (Signed)
Testosterone Injection What is this medication? TESTOSTERONE (tes TOS ter one) is used to increase testosterone levels in your body. It belongs to a group of medications called androgen hormones. This medicine may be used for other purposes; ask your health care provider or pharmacist if you have questions. COMMON BRAND NAME(S): Andro-L.A., Aveed, Delatestryl, Depo-Testosterone, Virilon What should I tell my care team before I take this medication? They need to know if you have any of these conditions: Cancer Diabetes Heart disease Kidney disease Liver disease Lung disease Prostate disease An unusual or allergic reaction to testosterone, other medications, foods, dyes, or preservatives If a male partner is pregnant or trying to get pregnant Breast-feeding How should I use this medication? This medication is for injection into a muscle. It is usually given in a hospital or clinic. A special MedGuide will be given to you by the pharmacist with each prescription and refill. Be sure to read this information carefully each time. Contact your care team regarding the use of this medication in children. While this medication may be prescribed for children as young as 56 years of age for selected conditions, precautions do apply. Overdosage: If you think you have taken too much of this medicine contact a poison control center or emergency room at once. NOTE: This medicine is only for you. Do not share this medicine with others. What if I miss a dose? Try not to miss a dose. Your care team will tell you when your next injection is due. Notify the office if you are unable to keep an appointment. What may interact with this medication? Medications for diabetes Medications that treat or prevent blood clots like warfarin Oxyphenbutazone Propranolol Steroid medications like prednisone or cortisone This list may not describe all possible interactions. Give your health care provider a list of all the  medicines, herbs, non-prescription drugs, or dietary supplements you use. Also tell them if you smoke, drink alcohol, or use illegal drugs. Some items may interact with your medicine. What should I watch for while using this medication? Visit your care team for regular checks on your progress. They will need to check the level of testosterone in your blood. This medication is only approved for use in men who have low levels of testosterone related to certain medical conditions. Heart attacks and strokes have been reported with the use of this medication. Notify your care team and seek emergency treatment if you develop breathing problems; changes in vision; confusion; chest pain or chest tightness; sudden arm pain; severe, sudden headache; trouble speaking or understanding; sudden numbness or weakness of the face, arm or leg; loss of balance or coordination. Talk to your care team about the risks and benefits of this medication. This medication may affect blood sugar levels. If you have diabetes, check with your care team before you change your diet or the dose of your diabetic medication. Testosterone injections are not commonly used in women. Women should inform their care team if they wish to become pregnant or think they might be pregnant. There is a potential for serious side effects to an unborn child. Talk to your care team or pharmacist for more information. Talk with your care team about your birth control options while taking this medication. This medication is banned from use in athletes by most athletic organizations. What side effects may I notice from receiving this medication? Side effects that you should report to your care team as soon as possible: Allergic reactions--skin rash, itching, hives, swelling of the  face, lips, tongue, or throat Blood clot--pain, swelling, or warmth in the leg, shortness of breath, chest pain Heart attack--pain or tightness in the chest, shoulders, arms or jaw,  nausea, shortness of breath, cold or clammy skin, feeling faint or lightheaded Increase in blood pressure Liver injury--right upper belly pain, loss of appetite, nausea, light-colored stool, dark yellow or brown urine, yellowing of the skin or eyes, unusual weakness or fatigue Mood swings, irritability, or hostility Prolonged or painful erection Sleep apnea--loud snoring, gasping during sleep, daytime sleepiness Stroke--sudden numbness or weakness of the face, arm or leg, trouble speaking, confusion, trouble walking, loss of balance or coordination, dizziness, severe headache, change in vision Swelling of the ankles, hands, or feet Thoughts of suicide or self-harm, worsening mood, feelings of depression Side effects that usually do not require medical attention (report to your care team if they continue or are bothersome): Acne Change in sex drive or performance Pain, redness, or irritation at the application site Unexpected breast tissue growth This list may not describe all possible side effects. Call your doctor for medical advice about side effects. You may report side effects to FDA at 1-800-FDA-1088. Where should I keep my medication? Keep out of the reach of children. This medication can be abused. Keep your medication in a safe place to protect it from theft. Do not share this medication with anyone. Selling or giving away this medication is dangerous and against the law. Store at room temperature between 20 and 25 degrees C (68 and 77 degrees F). Do not freeze. Protect from light. Follow the directions for the product you are prescribed. Throw away any unused medication after the expiration date. NOTE: This sheet is a summary. It may not cover all possible information. If you have questions about this medicine, talk to your doctor, pharmacist, or health care provider.  2023 Elsevier/Gold Standard (2020-01-30 00:00:00)

## 2021-10-24 ENCOUNTER — Telehealth: Payer: Self-pay | Admitting: *Deleted

## 2021-10-24 NOTE — Telephone Encounter (Signed)
Per 10/24/21 los - called and gave upcoming appointments - confirmed

## 2021-10-25 ENCOUNTER — Other Ambulatory Visit: Payer: Self-pay

## 2021-10-27 ENCOUNTER — Other Ambulatory Visit: Payer: Self-pay | Admitting: Hematology & Oncology

## 2021-10-27 ENCOUNTER — Other Ambulatory Visit (HOSPITAL_BASED_OUTPATIENT_CLINIC_OR_DEPARTMENT_OTHER): Payer: Self-pay

## 2021-10-27 DIAGNOSIS — C73 Malignant neoplasm of thyroid gland: Secondary | ICD-10-CM

## 2021-10-27 MED ORDER — PREDNISONE 50 MG PO TABS
ORAL_TABLET | ORAL | 0 refills | Status: DC
  Filled 2021-10-27: qty 3, 1d supply, fill #0

## 2021-10-27 NOTE — Progress Notes (Signed)
[facility] liver

## 2021-10-29 ENCOUNTER — Encounter: Payer: Self-pay | Admitting: Hematology & Oncology

## 2021-11-04 ENCOUNTER — Other Ambulatory Visit (HOSPITAL_BASED_OUTPATIENT_CLINIC_OR_DEPARTMENT_OTHER): Payer: Self-pay

## 2021-11-04 ENCOUNTER — Other Ambulatory Visit: Payer: Self-pay | Admitting: Hematology & Oncology

## 2021-11-04 DIAGNOSIS — C73 Malignant neoplasm of thyroid gland: Secondary | ICD-10-CM

## 2021-11-04 MED ORDER — TEMAZEPAM 30 MG PO CAPS
30.0000 mg | ORAL_CAPSULE | Freq: Every evening | ORAL | 0 refills | Status: DC | PRN
  Filled 2021-11-04: qty 30, 30d supply, fill #0

## 2021-11-04 MED ORDER — OLANZAPINE 10 MG PO TABS
ORAL_TABLET | Freq: Every day | ORAL | 4 refills | Status: DC
Start: 2021-11-04 — End: 2022-08-01
  Filled 2021-11-04: qty 30, 30d supply, fill #0
  Filled 2021-12-08: qty 30, 30d supply, fill #1
  Filled 2022-01-21: qty 30, 30d supply, fill #2
  Filled 2022-03-09: qty 30, 30d supply, fill #3
  Filled 2022-04-03: qty 30, 30d supply, fill #4

## 2021-11-06 ENCOUNTER — Other Ambulatory Visit: Payer: Self-pay | Admitting: Hematology & Oncology

## 2021-11-06 ENCOUNTER — Other Ambulatory Visit (HOSPITAL_BASED_OUTPATIENT_CLINIC_OR_DEPARTMENT_OTHER): Payer: Self-pay

## 2021-11-06 MED ORDER — POTASSIUM CHLORIDE CRYS ER 20 MEQ PO TBCR
20.0000 meq | EXTENDED_RELEASE_TABLET | Freq: Every day | ORAL | 5 refills | Status: DC
  Filled 2021-11-06: qty 30, 30d supply, fill #0
  Filled 2022-01-21: qty 30, 30d supply, fill #1
  Filled 2022-03-09: qty 30, 30d supply, fill #2
  Filled 2022-04-03: qty 30, 30d supply, fill #3

## 2021-11-06 MED ORDER — CEPHALEXIN 500 MG PO CAPS
500.0000 mg | ORAL_CAPSULE | Freq: Two times a day (BID) | ORAL | 0 refills | Status: DC
  Filled 2021-11-06: qty 6, 3d supply, fill #0

## 2021-11-07 ENCOUNTER — Ambulatory Visit (HOSPITAL_COMMUNITY)
Admission: RE | Admit: 2021-11-07 | Discharge: 2021-11-07 | Disposition: A | Discharge status: Home / Self Care | Payer: BC Managed Care – PPO | Source: Ambulatory Visit | Attending: Hematology & Oncology | Admitting: Hematology & Oncology

## 2021-11-07 DIAGNOSIS — C73 Malignant neoplasm of thyroid gland: Secondary | ICD-10-CM | POA: Diagnosis present

## 2021-11-07 MED ORDER — GADOBUTROL 1 MMOL/ML IV SOLN
10.0000 mL | Freq: Once | INTRAVENOUS | Status: AC | PRN
  Administered 2021-11-07: 10 mL via INTRAVENOUS

## 2021-11-10 ENCOUNTER — Inpatient Hospital Stay: Payer: BC Managed Care – PPO | Admitting: Hematology & Oncology

## 2021-11-10 ENCOUNTER — Inpatient Hospital Stay: Payer: BC Managed Care – PPO

## 2021-11-10 ENCOUNTER — Other Ambulatory Visit: Payer: Self-pay | Admitting: Hematology & Oncology

## 2021-11-10 ENCOUNTER — Encounter: Payer: Self-pay | Admitting: *Deleted

## 2021-11-10 ENCOUNTER — Other Ambulatory Visit: Payer: Self-pay | Admitting: *Deleted

## 2021-11-10 DIAGNOSIS — C73 Malignant neoplasm of thyroid gland: Secondary | ICD-10-CM

## 2021-11-11 ENCOUNTER — Encounter: Payer: Self-pay | Admitting: Hematology & Oncology

## 2021-11-11 ENCOUNTER — Inpatient Hospital Stay (HOSPITAL_BASED_OUTPATIENT_CLINIC_OR_DEPARTMENT_OTHER): Payer: BC Managed Care – PPO | Admitting: Hematology & Oncology

## 2021-11-11 ENCOUNTER — Inpatient Hospital Stay: Payer: BC Managed Care – PPO

## 2021-11-11 ENCOUNTER — Other Ambulatory Visit: Payer: Self-pay

## 2021-11-11 VITALS — BP 117/85 | HR 79 | Temp 97.8°F | Resp 18 | Wt 257.0 lb

## 2021-11-11 VITALS — BP 118/82 | HR 74

## 2021-11-11 DIAGNOSIS — C73 Malignant neoplasm of thyroid gland: Secondary | ICD-10-CM

## 2021-11-11 DIAGNOSIS — C799 Secondary malignant neoplasm of unspecified site: Secondary | ICD-10-CM | POA: Diagnosis not present

## 2021-11-11 DIAGNOSIS — D509 Iron deficiency anemia, unspecified: Secondary | ICD-10-CM

## 2021-11-11 DIAGNOSIS — E291 Testicular hypofunction: Secondary | ICD-10-CM

## 2021-11-11 LAB — CBC WITH DIFFERENTIAL (CANCER CENTER ONLY)
Abs Immature Granulocytes: 0.04 10*3/uL (ref 0.00–0.07)
Basophils Absolute: 0 10*3/uL (ref 0.0–0.1)
Basophils Relative: 1 %
Eosinophils Absolute: 0.1 10*3/uL (ref 0.0–0.5)
Eosinophils Relative: 2 %
HCT: 43.1 % (ref 39.0–52.0)
Hemoglobin: 13.7 g/dL (ref 13.0–17.0)
Immature Granulocytes: 1 %
Lymphocytes Relative: 12 %
Lymphs Abs: 0.4 10*3/uL — ABNORMAL LOW (ref 0.7–4.0)
MCH: 31.8 pg (ref 26.0–34.0)
MCHC: 31.8 g/dL (ref 30.0–36.0)
MCV: 100 fL (ref 80.0–100.0)
Monocytes Absolute: 0.8 10*3/uL (ref 0.1–1.0)
Monocytes Relative: 25 %
Neutro Abs: 2 10*3/uL (ref 1.7–7.7)
Neutrophils Relative %: 59 %
Platelet Count: 136 10*3/uL — ABNORMAL LOW (ref 150–400)
RBC: 4.31 MIL/uL (ref 4.22–5.81)
RDW: 17.2 % — ABNORMAL HIGH (ref 11.5–15.5)
WBC Count: 3.3 10*3/uL — ABNORMAL LOW (ref 4.0–10.5)
nRBC: 0.6 % — ABNORMAL HIGH (ref 0.0–0.2)

## 2021-11-11 LAB — CMP (CANCER CENTER ONLY)
ALT: 20 U/L (ref 0–44)
AST: 33 U/L (ref 15–41)
Albumin: 4 g/dL (ref 3.5–5.0)
Alkaline Phosphatase: 100 U/L (ref 38–126)
Anion gap: 8 (ref 5–15)
BUN: 14 mg/dL (ref 6–20)
CO2: 27 mmol/L (ref 22–32)
Calcium: 9.2 mg/dL (ref 8.9–10.3)
Chloride: 102 mmol/L (ref 98–111)
Creatinine: 0.9 mg/dL (ref 0.61–1.24)
GFR, Estimated: >60 mL/min (ref >60)
Glucose, Bld: 103 mg/dL — ABNORMAL HIGH (ref 70–99)
Potassium: 4 mmol/L (ref 3.5–5.1)
Sodium: 137 mmol/L (ref 135–145)
Total Bilirubin: 1 mg/dL (ref 0.3–1.2)
Total Protein: 6.4 g/dL — ABNORMAL LOW (ref 6.5–8.1)

## 2021-11-11 LAB — TSH: TSH: 28.276 u[IU]/mL — ABNORMAL HIGH (ref 0.350–4.500)

## 2021-11-11 MED ORDER — SODIUM CHLORIDE 0.9 % IV SOLN
10.0000 mg | Freq: Once | INTRAVENOUS | Status: AC
  Administered 2021-11-11: 10 mg via INTRAVENOUS
  Filled 2021-11-11: qty 10

## 2021-11-11 MED ORDER — SODIUM CHLORIDE 0.9 % IV SOLN: Freq: Once | INTRAVENOUS | Status: DC

## 2021-11-11 MED ORDER — FLUOROURACIL CHEMO INJECTION 2.5 GM/50ML
400.0000 mg/m2 | Freq: Once | INTRAVENOUS | Status: AC
  Administered 2021-11-11: 950 mg via INTRAVENOUS
  Filled 2021-11-11: qty 19

## 2021-11-11 MED ORDER — TESTOSTERONE CYPIONATE 200 MG/ML IM SOLN
400.0000 mg | INTRAMUSCULAR | Status: DC
  Administered 2021-11-11: 400 mg via INTRAMUSCULAR
  Filled 2021-11-11: qty 2

## 2021-11-11 MED ORDER — PALONOSETRON HCL INJECTION 0.25 MG/5ML
0.2500 mg | Freq: Once | INTRAVENOUS | Status: AC
  Administered 2021-11-11: 0.25 mg via INTRAVENOUS
  Filled 2021-11-11: qty 5

## 2021-11-11 MED ORDER — OXALIPLATIN CHEMO INJECTION 100 MG/20ML
67.0000 mg/m2 | Freq: Once | INTRAVENOUS | Status: AC
  Administered 2021-11-11: 160 mg via INTRAVENOUS
  Filled 2021-11-11: qty 32

## 2021-11-11 MED ORDER — ZOLEDRONIC ACID 4 MG/100ML IV SOLN
4.0000 mg | Freq: Once | INTRAVENOUS | Status: AC
  Administered 2021-11-11: 4 mg via INTRAVENOUS
  Filled 2021-11-11: qty 100

## 2021-11-11 MED ORDER — LEUCOVORIN CALCIUM INJECTION 350 MG
400.0000 mg/m2 | Freq: Once | INTRAVENOUS | Status: AC
  Administered 2021-11-11: 964 mg via INTRAVENOUS
  Filled 2021-11-11: qty 48.2

## 2021-11-11 MED ORDER — DEXTROSE 5 % IV SOLN: Freq: Once | INTRAVENOUS | Status: AC

## 2021-11-11 MED ORDER — SODIUM CHLORIDE 0.9 % IV SOLN
1900.0000 mg/m2 | INTRAVENOUS | Status: DC
  Administered 2021-11-11: 4600 mg via INTRAVENOUS
  Filled 2021-11-11: qty 92

## 2021-11-11 NOTE — Progress Notes (Signed)
Hematology and Oncology Follow Up Visit  Brian [name] [id number] 02-05-1976 46 y.o. 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 pulmonary emboli without right heart strain  DVT left popliteal vein    Past Therapy: Cabometyx 40 mg po q day -- start on 04/17/2019 - d/c on 07/17/2019 for progression Lenvima 24 mg po q day -- on hold due to proteinuria Taxotere/CDDP -- s/p cycle 4 - started on 09/13/2019   Current Therapy:      Vinblastine q week x 4 weeks on / 1 week off -- s/p  cycle #2 -- start on 02/24/2021 -- d/c on 05/20/2021 Gavreto 400 mg po q day -- started on 05/17/2020  -- d/c on 06/16/2020 Adriamycin weekly 2 weeks on 1 week off - s/p cycle 8 --d/c on 04/16/2020 due to progression XRT for LEFT shoulder met -completed on 06/22/2021 Pembrolizumab 200 mg IV q 3 week -- s/p cycle #8 - start on 06/26/2020 -- d/c on 01/16/2021 due to progression Zometa 4 mg IV q 2 months - next dose on 12/2021 Eliquis 5 mg po BID FOLFOX -- s/p cycle #8 -- start on 07/08/2021   Interim History:  Brian [name] is here today for follow-up.  He continues to do pretty well.  I am very impressed with the fact that the FOLFOX is still worked.  We did do a MRI of his liver.  This was done on 11/07/2021.  Basic, the MRI of the liver showed that he was responding.  He had overall generalized regression of his lesions along with some abdominal lymph nodes.  He had 1 liver lesion that may have been a little bit larger.  Most importantly, is the fact that his quality of life is doing okay.  I think testosterone some to do with this.  He gets testosterone monthly.  He has had no problems with cough or shortness of breath.  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  He has had no change in bowel or bladder habits.  His pain seems to be under fairly decent control right now.  He has had no rashes.  He has had no leg  swelling.  He has had no obvious bleeding.  Currently, I would say his performance status is probably ECOG 1.     Medications:  Allergies as of 11/11/2021       Reactions   Dextromethorphan-guaifenesin Other (See Comments)   Irregular heartbeat   Doxycycline Anaphylaxis, Nausea And Vomiting, Rash, Other (See Comments)   "heart arrythmia" and "dyspepsia" (only oral doxycycline causes reaction)   Gadobutrol Hives, Other (See Comments)   Patient had MRI scan at [facility]. Patient called one hour after he left imaging facility to report two "blisters" that came up on "back" of lip.    Gadolinium Derivatives Hives   Patient had MRI scan at [facility]. Patient called one hour after he left imaging facility to report two "blisters" that came up on "back" of lip.    Guaifenesin Palpitations      Ibuprofen Hives, Itching   Lisinopril Other (See Comments)   Angioedema   Pantoprazole Itching   Tramadol Hives, Itching   Nexavar [sorafenib] Hives   Barium Rash   Developed redness around neck after drinking 1st bottle of Barocat; pt was given Benedryl by ED Mds to be "on the safe side" before drinking 2nd bottle   Chlorhexidine Hives  Medication List        Accurate as of November 11, 2021  8:33 AM. If you have any questions, ask your nurse or doctor.          cephALEXin 500 MG capsule Commonly known as: KEFLEX Take 1 capsule (500 mg total) by mouth 2 (two) times daily for 3 days.   cyclobenzaprine 10 MG tablet Commonly known as: FLEXERIL Take 1 tablet (10 mg total) by mouth 3 (three) times daily as needed.   diphenhydrAMINE 25 MG tablet Commonly known as: BENADRYL Take 2 tablets(50 mg total) by mouth one hour prior to CT Scan   Eliquis 5 MG Tabs tablet Generic drug: apixaban TAKE 1 TABLET (5 MG TOTAL) BY MOUTH 2 (TWO) TIMES DAILY.   EPINEPHrine 0.3 mg/0.3 mL Soaj injection Commonly known as: EpiPen 2-Pak USE AS DIRECTED FOR LIFE THREATENING  ALLERGIC REACTIONS   famotidine 40 MG tablet Commonly known as: PEPCID Take 1 tablet (40 mg total) by mouth 2 (two) times daily.   Fusion Plus Caps Take 1 capsule by mouth daily.   HYDROmorphone 8 MG tablet Commonly known as: DILAUDID Take 1 tablet (8 mg total) by mouth every 4 (four) hours as needed for severe pain.   levothyroxine 200 MCG tablet Commonly known as: SYNTHROID Take one tablet (200 mcg dose) by mouth daily. Total:  288 mcg/day.   LORazepam 1 MG tablet Commonly known as: ATIVAN Take 1 tablet (1 mg total) by mouth every 6 (six) hours as needed for anxiety (for nausea and vomiting).   methylphenidate 10 MG tablet Commonly known as: RITALIN Take 1 tablet (10 mg total) by mouth 2 (two) times daily.   metoCLOPramide 10 MG tablet Commonly known as: REGLAN Take 1 tablet (10 mg total) by mouth every 6 (six) hours as needed for nausea or vomiting.   metoprolol tartrate 50 MG tablet Commonly known as: Lopressor Take 1 tablet (50 mg total) by mouth 2 (two) times daily.   multivitamin capsule Take 1 capsule by mouth daily.   OLANZapine 10 MG tablet Commonly known as: ZYPREXA TAKE 1 TABLET (10 MG TOTAL) BY MOUTH AT BEDTIME.   PEG 3350 17 GM/SCOOP Powd Mix 17 grams (1 Capful) in 8 ounces of liquid and drink by mouth daily as needed.   potassium chloride SA 20 MEQ tablet Commonly known as: KLOR-CON M Take 1 tablet (20 mEq total) by mouth daily.   predniSONE 50 MG tablet Commonly known as: DELTASONE TAKE 1 TABLET BY MOUTH 13 HOURS BEFORE CT SCAN, 1 TABLET 7 HOURS BEFORE CT SCAN, AND 1 TABLET 1 HOUR BEFORE CT SCAN   promethazine 25 MG tablet Commonly known as: PHENERGAN TAKE 1 TABLET BY MOUTH EVERY 6 HOURS AS NEEDED FOR NAUSEA & VOMITING   senna-docusate 8.6-50 MG tablet Commonly known as: Senokot-S Take 1 tablet by mouth at bedtime as needed for mild constipation.   tamsulosin 0.4 MG Caps capsule Commonly known as: Flomax Take 1 capsule (0.4 mg total) by  mouth daily after supper.   temazepam 30 MG capsule Commonly known as: RESTORIL Take 1 capsule (30 mg total) by mouth at bedtime as needed for sleep.   Xtampza ER 36 MG C12a Generic drug: oxyCODONE ER Take 1 capsule (36 mg total) by mouth every 12 (twelve) hours        Allergies:  Allergies  Allergen Reactions   Dextromethorphan-Guaifenesin Other (See Comments)    Irregular heartbeat    Doxycycline Anaphylaxis, Nausea And Vomiting, Rash and Other (See  Comments)    "heart arrythmia" and "dyspepsia" (only oral doxycycline causes reaction)   Gadobutrol Hives and Other (See Comments)    Patient had MRI scan at [facility]. Patient called one hour after he left imaging facility to report two "blisters" that came up on "back" of lip.     Gadolinium Derivatives Hives    Patient had MRI scan at [facility]. Patient called one hour after he left imaging facility to report two "blisters" that came up on "back" of lip.    Guaifenesin Palpitations        Ibuprofen Hives and Itching   Lisinopril Other (See Comments)    Angioedema   Pantoprazole Itching   Tramadol Hives and Itching   Nexavar [Sorafenib] Hives   Barium Rash    Developed redness around neck after drinking 1st bottle of Barocat; pt was given Benedryl by ED Mds to be "on the safe side" before drinking 2nd bottle   Chlorhexidine Hives    Past Medical History, Surgical history, Social history, and Family History were reviewed and updated.  Review of Systems: Review of Systems  Constitutional:  Positive for malaise/fatigue.  HENT: Negative.    Eyes: Negative.   Respiratory: Negative.    Cardiovascular: Negative.   Gastrointestinal:  Positive for abdominal pain, constipation, heartburn and nausea.  Genitourinary: Negative.   Musculoskeletal:  Positive for joint pain.  Skin: Negative.   Neurological: Negative.   Endo/Heme/Allergies: Negative.   Psychiatric/Behavioral: Negative.       Physical Exam:   weight is 257 lb (116.6 kg). His oral temperature is 97.8 F (36.6 C). His blood pressure is 117/85 and his pulse is 79. His respiration is 18 and oxygen saturation is 97%.   Wt Readings from Last 3 Encounters:  11/11/21 257 lb (116.6 kg)  10/21/21 260 lb (117.9 kg)  10/06/21 255 lb (115.7 kg)    Physical Exam Vitals reviewed.  HENT:     Head: Normocephalic and atraumatic.  Eyes:     Pupils: Pupils are equal, round, and reactive to light.  Cardiovascular:     Heart sounds: Normal heart sounds.     Comments: Cardiac exam shows a tachycardic rate.  He has occasional extra beat.  I do not hear any obvious murmurs or rubs. Pulmonary:     Effort: Pulmonary effort is normal.     Breath sounds: Normal breath sounds.  Abdominal:     General: Bowel sounds are normal.     Palpations: Abdomen is soft.  Musculoskeletal:        General: No tenderness or deformity. Normal range of motion.     Cervical back: Normal range of motion.  Lymphadenopathy:     Cervical: No cervical adenopathy.  Skin:    General: Skin is warm and dry.     Findings: No erythema or rash.  Neurological:     Mental Status: He is alert and oriented to person, place, and time.  Psychiatric:        Behavior: Behavior normal.        Thought Content: Thought content normal.        Judgment: Judgment normal.      Lab Results  Component Value Date   WBC 2.2 (L) 10/21/2021   HGB 12.3 (L) 10/21/2021   HCT 39.2 10/21/2021   MCV 101.3 (H) 10/21/2021   PLT 127 (L) 10/21/2021   Lab Results  Component Value Date   FERRITIN 483 (H) 10/21/2021   IRON 100 10/21/2021  TIBC 344 10/21/2021   UIBC 244 10/21/2021   IRONPCTSAT 29 10/21/2021   Lab Results  Component Value Date   RETICCTPCT 3.9 (H) 08/08/2020   RBC 3.87 (L) 10/21/2021   No results found for: "KPAFRELGTCHN", "LAMBDASER", "KAPLAMBRATIO" No results found for: "IGGSERUM", "IGA", "IGMSERUM" No results found for: "TOTALPROTELP", "ALBUMINELP", "A1GS", "A2GS",  "BETS", "BETA2SER", "GAMS", "MSPIKE", "SPEI"   Chemistry      Component Value Date/Time   NA 138 10/21/2021 0837   NA 141 02/06/2019 1210   K 4.1 10/21/2021 0837   CL 102 10/21/2021 0837   CO2 30 10/21/2021 0837   BUN 17 10/21/2021 0837   BUN 8 02/06/2019 1210   CREATININE 1.09 10/21/2021 0837      Component Value Date/Time   CALCIUM 9.8 10/21/2021 0837   ALKPHOS 87 10/21/2021 0837   AST 29 10/21/2021 0837   ALT 16 10/21/2021 0837   BILITOT 0.9 10/21/2021 0837       Impression and Plan: Brian [name] is a very pleasant 46 yo caucasian gentleman with an 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   Again, I am amazed as to how well the FOLFOX has helped him.  He is still responding to the FOLFOX.  He has a decent quality of life.  He and his family are going to [place], [place] on Thursday.  I know that they will have a wonderful time.  He will get his Zometa today.  He also gets his testosterone.  Both these really have helped his quality of life.  We will continue on the FOLFOX.  After another 4 cycles, I will repeat his scan.  I just want him to make it through the [place].  I know this is important for him.   [name], MD 9/26/20238:33 AM

## 2021-11-11 NOTE — Patient Instructions (Signed)

## 2021-11-11 NOTE — Progress Notes (Addendum)
Continue dose reductions from previous cycle. Orders changed per Dr. Antonieta Pert instructions. Fluorouracil pump to be discontinued at 9am on Thursday morning. Rate adjusted to reflect this change per Dr. Antonieta Pert instructions.

## 2021-11-11 NOTE — Patient Instructions (Addendum)
Testosterone Injection What is this medication? TESTOSTERONE (tes TOS ter one) is used to increase testosterone levels in your body. It belongs to a group of medications called androgen hormones. This medicine may be used for other purposes; ask your health care provider or pharmacist if you have questions. COMMON BRAND NAME(S): Andro-L.A., Aveed, Delatestryl, Depo-Testosterone, Virilon What should I tell my care team before I take this medication? They need to know if you have any of these conditions: Cancer Diabetes Heart disease Kidney disease Liver disease Lung disease Prostate disease An unusual or allergic reaction to testosterone, other medications, foods, dyes, or preservatives If a male partner is pregnant or trying to get pregnant Breast-feeding How should I use this medication? This medication is for injection into a muscle. It is usually given in a hospital or clinic. A special MedGuide will be given to you by the pharmacist with each prescription and refill. Be sure to read this information carefully each time. Contact your care team regarding the use of this medication in children. While this medication may be prescribed for children as young as 74 years of age for selected conditions, precautions do apply. Overdosage: If you think you have taken too much of this medicine contact a poison control center or emergency room at once. NOTE: This medicine is only for you. Do not share this medicine with others. What if I miss a dose? Try not to miss a dose. Your care team will tell you when your next injection is due. Notify the office if you are unable to keep an appointment. What may interact with this medication? Medications for diabetes Medications that treat or prevent blood clots like warfarin Oxyphenbutazone Propranolol Steroid medications like prednisone or cortisone This list may not describe all possible interactions. Give your health care provider a list of all the  medicines, herbs, non-prescription drugs, or dietary supplements you use. Also tell them if you smoke, drink alcohol, or use illegal drugs. Some items may interact with your medicine. What should I watch for while using this medication? Visit your care team for regular checks on your progress. They will need to check the level of testosterone in your blood. This medication is only approved for use in men who have low levels of testosterone related to certain medical conditions. Heart attacks and strokes have been reported with the use of this medication. Notify your care team and seek emergency treatment if you develop breathing problems; changes in vision; confusion; chest pain or chest tightness; sudden arm pain; severe, sudden headache; trouble speaking or understanding; sudden numbness or weakness of the face, arm or leg; loss of balance or coordination. Talk to your care team about the risks and benefits of this medication. This medication may affect blood sugar levels. If you have diabetes, check with your care team before you change your diet or the dose of your diabetic medication. Testosterone injections are not commonly used in women. Women should inform their care team if they wish to become pregnant or think they might be pregnant. There is a potential for serious side effects to an unborn child. Talk to your care team or pharmacist for more information. Talk with your care team about your birth control options while taking this medication. This medication is banned from use in athletes by most athletic organizations. What side effects may I notice from receiving this medication? Side effects that you should report to your care team as soon as possible: Allergic reactions--skin rash, itching, hives, swelling of the  face, lips, tongue, or throat Blood clot--pain, swelling, or warmth in the leg, shortness of breath, chest pain Heart attack--pain or tightness in the chest, shoulders, arms or jaw,  nausea, shortness of breath, cold or clammy skin, feeling faint or lightheaded Increase in blood pressure Liver injury--right upper belly pain, loss of appetite, nausea, light-colored stool, dark yellow or brown urine, yellowing of the skin or eyes, unusual weakness or fatigue Mood swings, irritability, or hostility Prolonged or painful erection Sleep apnea--loud snoring, gasping during sleep, daytime sleepiness Stroke--sudden numbness or weakness of the face, arm or leg, trouble speaking, confusion, trouble walking, loss of balance or coordination, dizziness, severe headache, change in vision Swelling of the ankles, hands, or feet Thoughts of suicide or self-harm, worsening mood, feelings of depression Side effects that usually do not require medical attention (report to your care team if they continue or are bothersome): Acne Change in sex drive or performance Pain, redness, or irritation at the application site Unexpected breast tissue growth This list may not describe all possible side effects. Call your doctor for medical advice about side effects. You may report side effects to FDA at 1-800-FDA-1088. Where should I keep my medication? Keep out of the reach of children. This medication can be abused. Keep your medication in a safe place to protect it from theft. Do not share this medication with anyone. Selling or giving away this medication is dangerous and against the law. Store at room temperature between 20 and 25 degrees C (68 and 77 degrees F). Do not freeze. Protect from light. Follow the directions for the product you are prescribed. Throw away any unused medication after the expiration date. NOTE: This sheet is a summary. It may not cover all possible information. If you have questions about this medicine, talk to your doctor, pharmacist, or health care provider.  2023 Elsevier/Gold Standard (2020-01-30 00:00:00)   Leucovorin Injection What is this medication? LEUCOVORIN (loo  koe VOR in) prevents side effects from certain medications, such as methotrexate. It works by increasing folate levels. This helps protect healthy cells in your body. It may also be used to treat anemia caused by low levels of folate. It can also be used with fluorouracil, a type of chemotherapy, to treat colorectal cancer. It works by increasing the effects of fluorouracil in the body. This medicine may be used for other purposes; ask your health care provider or pharmacist if you have questions. What should I tell my care team before I take this medication? They need to know if you have any of these conditions: Anemia from low levels of vitamin B12 in the blood An unusual or allergic reaction to leucovorin, folic acid, other medications, foods, dyes, or preservatives Pregnant or trying to get pregnant Breastfeeding How should I use this medication? This medication is injected into a vein or a muscle. It is given by your care team in a hospital or clinic setting. Talk to your care team about the use of this medication in children. Special care may be needed. Overdosage: If you think you have taken too much of this medicine contact a poison control center or emergency room at once. NOTE: This medicine is only for you. Do not share this medicine with others. What if I miss a dose? Keep appointments for follow-up doses. It is important not to miss your dose. Call your care team if you are unable to keep an appointment. What may interact with this medication? Capecitabine Fluorouracil Phenobarbital Phenytoin Primidone Trimethoprim;sulfamethoxazole This list  may not describe all possible interactions. Give your health care provider a list of all the medicines, herbs, non-prescription drugs, or dietary supplements you use. Also tell them if you smoke, drink alcohol, or use illegal drugs. Some items may interact with your medicine. What should I watch for while using this medication? Your condition  will be monitored carefully while you are receiving this medication. This medication may increase the side effects of 5-fluorouracil. Tell your care team if you have diarrhea or mouth sores that do not get better or that get worse. What side effects may I notice from receiving this medication? Side effects that you should report to your care team as soon as possible: Allergic reactions--skin rash, itching, hives, swelling of the face, lips, tongue, or throat This list may not describe all possible side effects. Call your doctor for medical advice about side effects. You may report side effects to FDA at 1-800-FDA-1088. Where should I keep my medication? This medication is given in a hospital or clinic. It will not be stored at home. NOTE: This sheet is a summary. It may not cover all possible information. If you have questions about this medicine, talk to your doctor, pharmacist, or health care provider.  2023 Elsevier/Gold Standard (2021-06-13 00:00:00)  Oxaliplatin Injection What is this medication? OXALIPLATIN (ox AL i PLA tin) treats some types of cancer. It works by slowing down the growth of cancer cells. This medicine may be used for other purposes; ask your health care provider or pharmacist if you have questions. COMMON BRAND NAME(S): Eloxatin What should I tell my care team before I take this medication? They need to know if you have any of these conditions: Heart disease History of irregular heartbeat or rhythm Liver disease Low blood cell levels (white cells, red cells, and platelets) Lung or breathing disease, such as asthma Take medications that treat or prevent blood clots Tingling of the fingers, toes, or other nerve disorder An unusual or allergic reaction to oxaliplatin, other medications, foods, dyes, or preservatives If you or your partner are pregnant or trying to get pregnant Breast-feeding How should I use this medication? This medication is injected into a vein.  It is given by your care team in a hospital or clinic setting. Talk to your care team about the use of this medication in children. Special care may be needed. Overdosage: If you think you have taken too much of this medicine contact a poison control center or emergency room at once. NOTE: This medicine is only for you. Do not share this medicine with others. What if I miss a dose? Keep appointments for follow-up doses. It is important not to miss a dose. Call your care team if you are unable to keep an appointment. What may interact with this medication? Do not take this medication with any of the following: Cisapride Dronedarone Pimozide Thioridazine This medication may also interact with the following: Aspirin and aspirin-like medications Certain medications that treat or prevent blood clots, such as warfarin, apixaban, dabigatran, and rivaroxaban Cisplatin Cyclosporine Diuretics Medications for infection, such as acyclovir, adefovir, amphotericin B, bacitracin, cidofovir, foscarnet, ganciclovir, gentamicin, pentamidine, vancomycin NSAIDs, medications for pain and inflammation, such as ibuprofen or naproxen Other medications that cause heart rhythm changes Pamidronate Zoledronic acid This list may not describe all possible interactions. Give your health care provider a list of all the medicines, herbs, non-prescription drugs, or dietary supplements you use. Also tell them if you smoke, drink alcohol, or use illegal drugs. Some items  may interact with your medicine. What should I watch for while using this medication? Your condition will be monitored carefully while you are receiving this medication. You may need blood work while taking this medication. This medication may make you feel generally unwell. This is not uncommon as chemotherapy can affect healthy cells as well as cancer cells. Report any side effects. Continue your course of treatment even though you feel ill unless your  care team tells you to stop. This medication may increase your risk of getting an infection. Call your care team for advice if you get a fever, chills, sore throat, or other symptoms of a cold or flu. Do not treat yourself. Try to avoid being around people who are sick. Avoid taking medications that contain aspirin, acetaminophen, ibuprofen, naproxen, or ketoprofen unless instructed by your care team. These medications may hide a fever. Be careful brushing or flossing your teeth or using a toothpick because you may get an infection or bleed more easily. If you have any dental work done, tell your dentist you are receiving this medication. This medication can make you more sensitive to cold. Do not drink cold drinks or use ice. Cover exposed skin before coming in contact with cold temperatures or cold objects. When out in cold weather wear warm clothing and cover your mouth and nose to warm the air that goes into your lungs. Tell your care team if you get sensitive to the cold. Talk to your care team if you or your partner are pregnant or think either of you might be pregnant. This medication can cause serious birth defects if taken during pregnancy and for 9 months after the last dose. A negative pregnancy test is required before starting this medication. A reliable form of contraception is recommended while taking this medication and for 9 months after the last dose. Talk to your care team about effective forms of contraception. Do not father a child while taking this medication and for 6 months after the last dose. Use a condom while having sex during this time period. Do not breastfeed while taking this medication and for 3 months after the last dose. This medication may cause infertility. Talk to your care team if you are concerned about your fertility. What side effects may I notice from receiving this medication? Side effects that you should report to your care team as soon as possible: Allergic  reactions--skin rash, itching, hives, swelling of the face, lips, tongue, or throat Bleeding--bloody or black, tar-like stools, vomiting blood or brown material that looks like coffee grounds, red or dark brown urine, small red or purple spots on skin, unusual bruising or bleeding Dry cough, shortness of breath or trouble breathing Heart rhythm changes--fast or irregular heartbeat, dizziness, feeling faint or lightheaded, chest pain, trouble breathing Infection--fever, chills, cough, sore throat, wounds that don't heal, pain or trouble when passing urine, general feeling of discomfort or being unwell Liver injury--right upper belly pain, loss of appetite, nausea, light-colored stool, dark yellow or brown urine, yellowing skin or eyes, unusual weakness or fatigue Low red blood cell level--unusual weakness or fatigue, dizziness, headache, trouble breathing Muscle injury--unusual weakness or fatigue, muscle pain, dark yellow or brown urine, decrease in amount of urine Pain, tingling, or numbness in the hands or feet Sudden and severe headache, confusion, change in vision, seizures, which may be signs of posterior reversible encephalopathy syndrome (PRES) Unusual bruising or bleeding Side effects that usually do not require medical attention (report to your care team if  they continue or are bothersome): Diarrhea Nausea Pain, redness, or swelling with sores inside the mouth or throat Unusual weakness or fatigue Vomiting This list may not describe all possible side effects. Call your doctor for medical advice about side effects. You may report side effects to FDA at 1-800-FDA-1088. Where should I keep my medication? This medication is given in a hospital or clinic. It will not be stored at home. NOTE: This sheet is a summary. It may not cover all possible information. If you have questions about this medicine, talk to your doctor, pharmacist, or health care provider.  2023 Elsevier/Gold Standard  (2021-05-30 00:00:00) Zoledronic Acid Injection (Cancer) What is this medication? ZOLEDRONIC ACID (ZOE le dron ik AS id) treats high calcium levels in the blood caused by cancer. It may also be used with chemotherapy to treat weakened bones caused by cancer. It works by slowing down the release of calcium from bones. This lowers calcium levels in your blood. It also makes your bones stronger and less likely to break (fracture). It belongs to a group of medications called bisphosphonates. This medicine may be used for other purposes; ask your health care provider or pharmacist if you have questions. COMMON BRAND NAME(S): Zometa, Zometa Powder What should I tell my care team before I take this medication? They need to know if you have any of these conditions: Dehydration Dental disease Kidney disease Liver disease Low levels of calcium in the blood Lung or breathing disease, such as asthma Receiving steroids, such as dexamethasone or prednisone An unusual or allergic reaction to zoledronic acid, other medications, foods, dyes, or preservatives Pregnant or trying to get pregnant Breast-feeding How should I use this medication? This medication is injected into a vein. It is given by your care team in a hospital or clinic setting. Talk to your care team about the use of this medication in children. Special care may be needed. Overdosage: If you think you have taken too much of this medicine contact a poison control center or emergency room at once. NOTE: This medicine is only for you. Do not share this medicine with others. What if I miss a dose? Keep appointments for follow-up doses. It is important not to miss your dose. Call your care team if you are unable to keep an appointment. What may interact with this medication? Certain antibiotics given by injection Diuretics, such as bumetanide, furosemide NSAIDs, medications for pain and inflammation, such as ibuprofen or  naproxen Teriparatide Thalidomide This list may not describe all possible interactions. Give your health care provider a list of all the medicines, herbs, non-prescription drugs, or dietary supplements you use. Also tell them if you smoke, drink alcohol, or use illegal drugs. Some items may interact with your medicine. What should I watch for while using this medication? Visit your care team for regular checks on your progress. It may be some time before you see the benefit from this medication. Some people who take this medication have severe bone, joint, or muscle pain. This medication may also increase your risk for jaw problems or a broken thigh bone. Tell your care team right away if you have severe pain in your jaw, bones, joints, or muscles. Tell you care team if you have any pain that does not go away or that gets worse. Tell your dentist and dental surgeon that you are taking this medication. You should not have major dental surgery while on this medication. See your dentist to have a dental exam and fix  any dental problems before starting this medication. Take good care of your teeth while on this medication. Make sure you see your dentist for regular follow-up appointments. You should make sure you get enough calcium and vitamin D while you are taking this medication. Discuss the foods you eat and the vitamins you take with your care team. Check with your care team if you have severe diarrhea, nausea, and vomiting, or if you sweat a lot. The loss of too much body fluid may make it dangerous for you to take this medication. You may need bloodwork while taking this medication. Talk to your care team if you wish to become pregnant or think you might be pregnant. This medication can cause serious birth defects. What side effects may I notice from receiving this medication? Side effects that you should report to your care team as soon as possible: Allergic reactions--skin rash, itching, hives,  swelling of the face, lips, tongue, or throat Kidney injury--decrease in the amount of urine, swelling of the ankles, hands, or feet Low calcium level--muscle pain or cramps, confusion, tingling, or numbness in the hands or feet Osteonecrosis of the jaw--pain, swelling, or redness in the mouth, numbness of the jaw, poor healing after dental work, unusual discharge from the mouth, visible bones in the mouth Severe bone, joint, or muscle pain Side effects that usually do not require medical attention (report to your care team if they continue or are bothersome): Constipation Fatigue Fever Loss of appetite Nausea Stomach pain This list may not describe all possible side effects. Call your doctor for medical advice about side effects. You may report side effects to FDA at 1-800-FDA-1088. Where should I keep my medication? This medication is given in a hospital or clinic. It will not be stored at home. NOTE: This sheet is a summary. It may not cover all possible information. If you have questions about this medicine, talk to your doctor, pharmacist, or health care provider.  2023 Else[employer]/Gold Standard (2021-03-20 00:00:00)

## 2021-11-12 ENCOUNTER — Inpatient Hospital Stay: Payer: BC Managed Care – PPO

## 2021-11-13 ENCOUNTER — Other Ambulatory Visit: Payer: Self-pay

## 2021-11-13 ENCOUNTER — Inpatient Hospital Stay: Payer: BC Managed Care – PPO

## 2021-11-13 VITALS — BP 120/84 | HR 78 | Temp 97.8°F

## 2021-11-13 DIAGNOSIS — C73 Malignant neoplasm of thyroid gland: Secondary | ICD-10-CM

## 2021-11-13 LAB — TESTOSTERONE: Testosterone: 370 ng/dL (ref 264–916)

## 2021-11-13 MED ORDER — SODIUM CHLORIDE 0.9% FLUSH
10.0000 mL | INTRAVENOUS | Status: DC | PRN
  Administered 2021-11-13: 10 mL

## 2021-11-13 MED ORDER — HEPARIN SOD (PORK) LOCK FLUSH 100 UNIT/ML IV SOLN
500.0000 [IU] | Freq: Once | INTRAVENOUS | Status: AC | PRN
  Administered 2021-11-13: 500 [IU]

## 2021-11-25 ENCOUNTER — Other Ambulatory Visit (HOSPITAL_BASED_OUTPATIENT_CLINIC_OR_DEPARTMENT_OTHER): Payer: Self-pay

## 2021-11-25 ENCOUNTER — Other Ambulatory Visit: Payer: Self-pay | Admitting: Hematology & Oncology

## 2021-11-25 DIAGNOSIS — C799 Secondary malignant neoplasm of unspecified site: Secondary | ICD-10-CM

## 2021-11-25 DIAGNOSIS — C787 Secondary malignant neoplasm of liver and intrahepatic bile duct: Secondary | ICD-10-CM

## 2021-11-25 DIAGNOSIS — R110 Nausea: Secondary | ICD-10-CM

## 2021-11-25 DIAGNOSIS — C73 Malignant neoplasm of thyroid gland: Secondary | ICD-10-CM

## 2021-11-25 DIAGNOSIS — M546 Pain in thoracic spine: Secondary | ICD-10-CM

## 2021-11-26 ENCOUNTER — Inpatient Hospital Stay: Payer: BC Managed Care – PPO | Attending: Hematology & Oncology

## 2021-11-26 ENCOUNTER — Inpatient Hospital Stay: Payer: BC Managed Care – PPO

## 2021-11-26 ENCOUNTER — Inpatient Hospital Stay (HOSPITAL_BASED_OUTPATIENT_CLINIC_OR_DEPARTMENT_OTHER): Payer: BC Managed Care – PPO | Admitting: Hematology & Oncology

## 2021-11-26 ENCOUNTER — Other Ambulatory Visit: Payer: Self-pay

## 2021-11-26 ENCOUNTER — Other Ambulatory Visit (HOSPITAL_BASED_OUTPATIENT_CLINIC_OR_DEPARTMENT_OTHER): Payer: Self-pay

## 2021-11-26 ENCOUNTER — Encounter: Payer: Self-pay | Admitting: Hematology & Oncology

## 2021-11-26 ENCOUNTER — Encounter: Payer: Self-pay | Admitting: Family

## 2021-11-26 VITALS — BP 118/74 | HR 94 | Temp 98.9°F | Resp 18 | Ht 70.0 in | Wt 266.0 lb

## 2021-11-26 DIAGNOSIS — C73 Malignant neoplasm of thyroid gland: Secondary | ICD-10-CM

## 2021-11-26 DIAGNOSIS — Z79899 Other long term (current) drug therapy: Secondary | ICD-10-CM | POA: Diagnosis not present

## 2021-11-26 DIAGNOSIS — C787 Secondary malignant neoplasm of liver and intrahepatic bile duct: Secondary | ICD-10-CM | POA: Insufficient documentation

## 2021-11-26 DIAGNOSIS — Z5111 Encounter for antineoplastic chemotherapy: Secondary | ICD-10-CM | POA: Diagnosis present

## 2021-11-26 DIAGNOSIS — C799 Secondary malignant neoplasm of unspecified site: Secondary | ICD-10-CM

## 2021-11-26 LAB — CBC WITH DIFFERENTIAL (CANCER CENTER ONLY)
Abs Immature Granulocytes: 0.01 10*3/uL (ref 0.00–0.07)
Basophils Absolute: 0 10*3/uL (ref 0.0–0.1)
Basophils Relative: 1 %
Eosinophils Absolute: 0.1 10*3/uL (ref 0.0–0.5)
Eosinophils Relative: 2 %
HCT: 38.8 % — ABNORMAL LOW (ref 39.0–52.0)
Hemoglobin: 12.4 g/dL — ABNORMAL LOW (ref 13.0–17.0)
Immature Granulocytes: 0 %
Lymphocytes Relative: 9 %
Lymphs Abs: 0.3 10*3/uL — ABNORMAL LOW (ref 0.7–4.0)
MCH: 32.2 pg (ref 26.0–34.0)
MCHC: 32 g/dL (ref 30.0–36.0)
MCV: 100.8 fL — ABNORMAL HIGH (ref 80.0–100.0)
Monocytes Absolute: 0.7 10*3/uL (ref 0.1–1.0)
Monocytes Relative: 19 %
Neutro Abs: 2.5 10*3/uL (ref 1.7–7.7)
Neutrophils Relative %: 69 %
Platelet Count: 129 10*3/uL — ABNORMAL LOW (ref 150–400)
RBC: 3.85 MIL/uL — ABNORMAL LOW (ref 4.22–5.81)
RDW: 17 % — ABNORMAL HIGH (ref 11.5–15.5)
WBC Count: 3.5 10*3/uL — ABNORMAL LOW (ref 4.0–10.5)
nRBC: 0 % (ref 0.0–0.2)

## 2021-11-26 LAB — CMP (CANCER CENTER ONLY)
ALT: 18 U/L (ref 0–44)
AST: 31 U/L (ref 15–41)
Albumin: 3.8 g/dL (ref 3.5–5.0)
Alkaline Phosphatase: 108 U/L (ref 38–126)
Anion gap: 7 (ref 5–15)
BUN: 14 mg/dL (ref 6–20)
CO2: 28 mmol/L (ref 22–32)
Calcium: 9.8 mg/dL (ref 8.9–10.3)
Chloride: 101 mmol/L (ref 98–111)
Creatinine: 1.08 mg/dL (ref 0.61–1.24)
GFR, Estimated: >60 mL/min (ref >60)
Glucose, Bld: 120 mg/dL — ABNORMAL HIGH (ref 70–99)
Potassium: 4 mmol/L (ref 3.5–5.1)
Sodium: 136 mmol/L (ref 135–145)
Total Bilirubin: 0.9 mg/dL (ref 0.3–1.2)
Total Protein: 6.1 g/dL — ABNORMAL LOW (ref 6.5–8.1)

## 2021-11-26 LAB — IRON AND IRON BINDING CAPACITY (CC-WL,HP ONLY)
Iron: 51 ug/dL (ref 45–182)
Saturation Ratios: 16 % — ABNORMAL LOW (ref 17.9–39.5)
TIBC: 319 ug/dL (ref 250–450)
UIBC: 268 ug/dL (ref 117–376)

## 2021-11-26 LAB — LACTATE DEHYDROGENASE: LDH: 288 U/L — ABNORMAL HIGH (ref 98–192)

## 2021-11-26 LAB — FERRITIN: Ferritin: 457 ng/mL — ABNORMAL HIGH (ref 24–336)

## 2021-11-26 MED ORDER — PROCHLORPERAZINE MALEATE 10 MG PO TABS
10.0000 mg | ORAL_TABLET | Freq: Four times a day (QID) | ORAL | 1 refills | Status: DC | PRN
  Filled 2021-11-26: qty 30, 8d supply, fill #0
  Filled 2021-12-08: qty 30, 8d supply, fill #1

## 2021-11-26 MED ORDER — LEUCOVORIN CALCIUM INJECTION 350 MG
400.0000 mg/m2 | Freq: Once | INTRAVENOUS | Status: AC
  Administered 2021-11-26: 964 mg via INTRAVENOUS
  Filled 2021-11-26: qty 48.2

## 2021-11-26 MED ORDER — OXALIPLATIN CHEMO INJECTION 100 MG/20ML
67.0000 mg/m2 | Freq: Once | INTRAVENOUS | Status: AC
  Administered 2021-11-26: 160 mg via INTRAVENOUS
  Filled 2021-11-26: qty 32

## 2021-11-26 MED ORDER — DEXTROSE 5 % IV SOLN: Freq: Once | INTRAVENOUS | Status: AC

## 2021-11-26 MED ORDER — SODIUM CHLORIDE 0.9 % IV SOLN
1900.0000 mg/m2 | INTRAVENOUS | Status: DC
  Administered 2021-11-26: 4600 mg via INTRAVENOUS
  Filled 2021-11-26: qty 92

## 2021-11-26 MED ORDER — PALONOSETRON HCL INJECTION 0.25 MG/5ML
0.2500 mg | Freq: Once | INTRAVENOUS | Status: AC
  Administered 2021-11-26: 0.25 mg via INTRAVENOUS
  Filled 2021-11-26: qty 5

## 2021-11-26 MED ORDER — PROMETHAZINE HCL 25 MG PO TABS
25.0000 mg | ORAL_TABLET | Freq: Four times a day (QID) | ORAL | 0 refills | Status: DC | PRN
  Filled 2021-11-26: qty 120, 30d supply, fill #0

## 2021-11-26 MED ORDER — HYDROMORPHONE HCL 8 MG PO TABS
8.0000 mg | ORAL_TABLET | ORAL | 0 refills | Status: DC | PRN
  Filled 2021-11-26: qty 120, 20d supply, fill #0

## 2021-11-26 MED ORDER — SODIUM CHLORIDE 0.9% FLUSH
10.0000 mL | Freq: Once | INTRAVENOUS | Status: AC
  Administered 2021-11-26: 10 mL via INTRAVENOUS

## 2021-11-26 MED ORDER — SODIUM CHLORIDE 0.9 % IV SOLN
10.0000 mg | Freq: Once | INTRAVENOUS | Status: AC
  Administered 2021-11-26: 10 mg via INTRAVENOUS
  Filled 2021-11-26: qty 10

## 2021-11-26 MED ORDER — DEXTROSE 5 % IV SOLN: Freq: Once | INTRAVENOUS | Status: DC

## 2021-11-26 MED ORDER — FLUOROURACIL CHEMO INJECTION 2.5 GM/50ML
400.0000 mg/m2 | Freq: Once | INTRAVENOUS | Status: AC
  Administered 2021-11-26: 950 mg via INTRAVENOUS
  Filled 2021-11-26: qty 19

## 2021-11-26 NOTE — Progress Notes (Signed)
Patient would like to get Testosterone injection on 11/28/21 w pump DC.  [name] [place], [place], BCPS, BCOP 11/26/2021 12:41 PM

## 2021-11-26 NOTE — Progress Notes (Signed)
Hematology and Oncology Follow Up Visit  Brian [name] [id number] September 23, 1975 46 y.o. 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 + Bilateral pulmonary emboli without right heart strain  DVT left popliteal vein    Past Therapy: Cabometyx 40 mg po q day -- start on 04/17/2019 - d/c on 07/17/2019 for progression Lenvima 24 mg po q day -- on hold due to proteinuria Taxotere/CDDP -- s/p cycle 4 - started on 09/13/2019   Current Therapy:      Vinblastine q week x 4 weeks on / 1 week off -- s/p  cycle #2 -- start on 02/24/2021 -- d/c on 05/20/2021 Gavreto 400 mg po q day -- started on 05/17/2020  -- d/c on 06/16/2020 Adriamycin weekly 2 weeks on 1 week off - s/p cycle 8 --d/c on 04/16/2020 due to progression XRT for LEFT shoulder met -completed on 06/22/2021 Pembrolizumab 200 mg IV q 3 week -- s/p cycle #8 - start on 06/26/2020 -- d/c on 01/16/2021 due to progression Zometa 4 mg IV q 2 months - next dose on 12/2021 Eliquis 5 mg po BID FOLFOX -- s/p cycle #9 -- start on 07/08/2021   Interim History:  Brian [name] is here today for follow-up.  Amazingly, he continues to do remarkably well.  I am very impressed with his resilience.  He had MRI of the liver back on 11/07/2021.  Thankfully, the MRI did show that he was responding to treatment.  Unfortunately, his insurance company would not let us do a PET scan on him.  Henrene Pastor is having little bit of discomfort over in the left hip area.  No he has had radiation therapy to this area.  He has had no problems with cough or shortness of breath.  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  He has had no problems with leg swelling.  He has had no fever.  He has had no headache.  He and his family were out in [facility].  They had a wonderful time.  He is eating well.  He has had no problems with nausea.  He has had no rashes.  Overall, I was his performance status is probably  ECOG 1.    Medications:  Allergies as of 11/26/2021       Reactions   Dextromethorphan-guaifenesin Other (See Comments)   Irregular heartbeat   Doxycycline Anaphylaxis, Nausea And Vomiting, Rash, Other (See Comments)   "heart arrythmia" and "dyspepsia" (only oral doxycycline causes reaction)   Gadobutrol Hives, Other (See Comments)   Patient had MRI scan at [facility]. Patient called one hour after he left imaging facility to report two "blisters" that came up on "back" of lip.    Gadolinium Derivatives Hives   Patient had MRI scan at [facility]. Patient called one hour after he left imaging facility to report two "blisters" that came up on "back" of lip.    Guaifenesin Palpitations      Ibuprofen Hives, Itching   Lisinopril Other (See Comments)   Angioedema   Pantoprazole Itching   Tramadol Hives, Itching   Nexavar [sorafenib] Hives   Barium Rash   Developed redness around neck after drinking 1st bottle of Barocat; pt was given Benedryl by ED Mds to be "on the safe side" before drinking 2nd bottle   Chlorhexidine Hives        Medication List        Accurate as of November 26, 2021 11:22 AM. If you have any questions, ask your nurse or doctor.          STOP taking these medications    cyclobenzaprine 10 MG tablet Commonly known as: FLEXERIL Stopped by: [name], MD       TAKE these medications    cephALEXin 500 MG capsule Commonly known as: KEFLEX Take 1 capsule (500 mg total) by mouth 2 (two) times daily for 3 days.   diphenhydrAMINE 25 MG tablet Commonly known as: BENADRYL Take 2 tablets(50 mg total) by mouth one hour prior to CT Scan   Eliquis 5 MG Tabs tablet Generic drug: apixaban TAKE 1 TABLET (5 MG TOTAL) BY MOUTH 2 (TWO) TIMES DAILY.   EPINEPHrine 0.3 mg/0.3 mL Soaj injection Commonly known as: EpiPen 2-Pak USE AS DIRECTED FOR LIFE THREATENING ALLERGIC REACTIONS   famotidine 40 MG tablet Commonly known as: PEPCID Take 1  tablet (40 mg total) by mouth 2 (two) times daily.   Fusion Plus Caps Take 1 capsule by mouth daily.   HYDROmorphone 8 MG tablet Commonly known as: DILAUDID Take 1 tablet (8 mg total) by mouth every 4 (four) hours as needed for severe pain.   HYDROmorphone 8 MG tablet Commonly known as: DILAUDID Take 1 tablet (8 mg total) by mouth every 4 (four) hours as needed for severe pain.   levothyroxine 200 MCG tablet Commonly known as: SYNTHROID Take one tablet (200 mcg dose) by mouth daily. Total:  288 mcg/day.   LORazepam 1 MG tablet Commonly known as: ATIVAN Take 1 tablet (1 mg total) by mouth every 6 (six) hours as needed for anxiety (for nausea and vomiting).   methylphenidate 10 MG tablet Commonly known as: RITALIN Take 1 tablet (10 mg total) by mouth 2 (two) times daily.   metoCLOPramide 10 MG tablet Commonly known as: REGLAN Take 1 tablet (10 mg total) by mouth every 6 (six) hours as needed for nausea or vomiting.   metoprolol tartrate 50 MG tablet Commonly known as: Lopressor Take 1 tablet (50 mg total) by mouth 2 (two) times daily.   multivitamin capsule Take 1 capsule by mouth daily.   OLANZapine 10 MG tablet Commonly known as: ZYPREXA TAKE 1 TABLET (10 MG TOTAL) BY MOUTH AT BEDTIME.   PEG 3350 17 GM/SCOOP Powd Mix 17 grams (1 Capful) in 8 ounces of liquid and drink by mouth daily as needed.   potassium chloride SA 20 MEQ tablet Commonly known as: KLOR-CON M Take 1 tablet (20 mEq total) by mouth daily.   predniSONE 50 MG tablet Commonly known as: DELTASONE TAKE 1 TABLET BY MOUTH 13 HOURS BEFORE CT SCAN, 1 TABLET 7 HOURS BEFORE CT SCAN, AND 1 TABLET 1 HOUR BEFORE CT SCAN   prochlorperazine 10 MG tablet Commonly known as: COMPAZINE Take 1 tablet (10 mg total) by mouth every 6 (six) hours as needed (Nausea or vomiting).   promethazine 25 MG tablet Commonly known as: PHENERGAN Take 1 tablet (25 mg total) by mouth every 6 (six) hours as needed for nausea and  vomiting.   senna-docusate 8.6-50 MG tablet Commonly known as: Senokot-S Take 1 tablet by mouth at bedtime as needed for mild constipation.   tamsulosin 0.4 MG Caps capsule Commonly known as: Flomax Take 1 capsule (0.4 mg total) by mouth daily after supper.   temazepam 30 MG capsule Commonly known as: RESTORIL Take 1 capsule (30 mg total) by mouth at bedtime as needed for sleep.  Allergies:  Allergies  Allergen Reactions   Dextromethorphan-Guaifenesin Other (See Comments)    Irregular heartbeat    Doxycycline Anaphylaxis, Nausea And Vomiting, Rash and Other (See Comments)    "heart arrythmia" and "dyspepsia" (only oral doxycycline causes reaction)   Gadobutrol Hives and Other (See Comments)    Patient had MRI scan at [place]. Patient called one hour after he left imaging facility to report two "blisters" that came up on "back" of lip.     Gadolinium Derivatives Hives    Patient had MRI scan at [place]. Patient called one hour after he left imaging facility to report two "blisters" that came up on "back" of lip.    Guaifenesin Palpitations        Ibuprofen Hives and Itching   Lisinopril Other (See Comments)    Angioedema   Pantoprazole Itching   Tramadol Hives and Itching   Nexavar [Sorafenib] Hives   Barium Rash    Developed redness around neck after drinking 1st bottle of Barocat; pt was given Benedryl by ED Mds to be "on the safe side" before drinking 2nd bottle   Chlorhexidine Hives    Past Medical History, Surgical history, Social history, and Family History were reviewed and updated.  Review of Systems: Review of Systems  Constitutional:  Positive for malaise/fatigue.  HENT: Negative.    Eyes: Negative.   Respiratory: Negative.    Cardiovascular: Negative.   Gastrointestinal:  Positive for abdominal pain, constipation, heartburn and nausea.  Genitourinary: Negative.   Musculoskeletal:  Positive for joint pain.  Skin: Negative.    Neurological: Negative.   Endo/Heme/Allergies: Negative.   Psychiatric/Behavioral: Negative.       Physical Exam:  height is 5' 10" (1.778 m) and weight is 266 lb (120.7 kg). His oral temperature is 98.9 F (37.2 C). His blood pressure is 118/74 and his pulse is 94. His respiration is 18 and oxygen saturation is 99%.   Wt Readings from Last 3 Encounters:  11/26/21 266 lb (120.7 kg)  11/11/21 257 lb (116.6 kg)  10/21/21 260 lb (117.9 kg)    Physical Exam Vitals reviewed.  HENT:     Head: Normocephalic and atraumatic.  Eyes:     Pupils: Pupils are equal, round, and reactive to light.  Cardiovascular:     Heart sounds: Normal heart sounds.     Comments: Cardiac exam shows a tachycardic rate.  He has occasional extra beat.  I do not hear any obvious murmurs or rubs. Pulmonary:     Effort: Pulmonary effort is normal.     Breath sounds: Normal breath sounds.  Abdominal:     General: Bowel sounds are normal.     Palpations: Abdomen is soft.  Musculoskeletal:        General: No tenderness or deformity. Normal range of motion.     Cervical back: Normal range of motion.  Lymphadenopathy:     Cervical: No cervical adenopathy.  Skin:    General: Skin is warm and dry.     Findings: No erythema or rash.  Neurological:     Mental Status: He is alert and oriented to person, place, and time.  Psychiatric:        Behavior: Behavior normal.        Thought Content: Thought content normal.        Judgment: Judgment normal.      Lab Results  Component Value Date   WBC 3.5 (L) 11/26/2021   HGB 12.4 (L) 11/26/2021  HCT 38.8 (L) 11/26/2021   MCV 100.8 (H) 11/26/2021   PLT 129 (L) 11/26/2021   Lab Results  Component Value Date   FERRITIN 483 (H) 10/21/2021   IRON 100 10/21/2021   TIBC 344 10/21/2021   UIBC 244 10/21/2021   IRONPCTSAT 29 10/21/2021   Lab Results  Component Value Date   RETICCTPCT 3.9 (H) 08/08/2020   RBC 3.85 (L) 11/26/2021   No results found for:  "KPAFRELGTCHN", "LAMBDASER", "KAPLAMBRATIO" No results found for: "IGGSERUM", "IGA", "IGMSERUM" No results found for: "TOTALPROTELP", "ALBUMINELP", "A1GS", "A2GS", "BETS", "BETA2SER", "GAMS", "MSPIKE", "SPEI"   Chemistry      Component Value Date/Time   NA 136 11/26/2021 0929   NA 141 02/06/2019 1210   K 4.0 11/26/2021 0929   CL 101 11/26/2021 0929   CO2 28 11/26/2021 0929   BUN 14 11/26/2021 0929   BUN 8 02/06/2019 1210   CREATININE 1.08 11/26/2021 0929      Component Value Date/Time   CALCIUM 9.8 11/26/2021 0929   ALKPHOS 108 11/26/2021 0929   AST 31 11/26/2021 0929   ALT 18 11/26/2021 0929   BILITOT 0.9 11/26/2021 0929       Impression and Plan: Brian [name] is a very pleasant 46 yo caucasian gentleman with an 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   Again, I am amazed as to how well the FOLFOX has helped him.  He is still responding to the FOLFOX.  He has a decent quality of life.   As always we see a response in the fact that he is tolerating treatment well, I do not see that we have to make any changes in his protocol.  We probably will do 4 more cycles of treatment and then we will go ahead and do another set of scans.  We do give him his testosterone.  This also has helped his quality of life.  He will get his Zometa in November.    [name], MD 10/11/202311:22 AM

## 2021-11-27 ENCOUNTER — Other Ambulatory Visit: Payer: Self-pay

## 2021-11-28 ENCOUNTER — Other Ambulatory Visit (HOSPITAL_BASED_OUTPATIENT_CLINIC_OR_DEPARTMENT_OTHER): Payer: Self-pay

## 2021-11-28 ENCOUNTER — Inpatient Hospital Stay: Payer: BC Managed Care – PPO

## 2021-11-28 VITALS — BP 121/82 | HR 89 | Temp 97.9°F | Resp 18

## 2021-11-28 DIAGNOSIS — C73 Malignant neoplasm of thyroid gland: Secondary | ICD-10-CM

## 2021-11-28 DIAGNOSIS — E291 Testicular hypofunction: Secondary | ICD-10-CM

## 2021-11-28 DIAGNOSIS — D509 Iron deficiency anemia, unspecified: Secondary | ICD-10-CM

## 2021-11-28 LAB — TESTOSTERONE: Testosterone: 484 ng/dL (ref 264–916)

## 2021-11-28 MED ORDER — TESTOSTERONE CYPIONATE 200 MG/ML IM SOLN
400.0000 mg | INTRAMUSCULAR | Status: DC
  Administered 2021-11-28: 400 mg via INTRAMUSCULAR
  Filled 2021-11-28: qty 2

## 2021-11-28 MED ORDER — HEPARIN SOD (PORK) LOCK FLUSH 100 UNIT/ML IV SOLN
500.0000 [IU] | Freq: Once | INTRAVENOUS | Status: AC | PRN
  Administered 2021-11-28: 500 [IU]

## 2021-11-28 MED ORDER — SODIUM CHLORIDE 0.9% FLUSH
10.0000 mL | INTRAVENOUS | Status: DC | PRN
  Administered 2021-11-28: 10 mL

## 2021-11-28 NOTE — Patient Instructions (Signed)
[facility]  Discharge Instructions: Thank you for choosing [facility] to provide your oncology and hematology care.   If you have a lab appointment with the [facility], please go directly to the [facility] and check in at the registration area.  Wear comfortable clothing and clothing appropriate for easy access to any Portacath or PICC line.   We strive to give you quality time with your provider. You may need to reschedule your appointment if you arrive late (15 or more minutes).  Arriving late affects you and other patients whose appointments are after yours.  Also, if you miss three or more appointments without notifying the office, you may be dismissed from the clinic at the provider's discretion.      For prescription refill requests, have your pharmacy contact our office and allow 72 hours for refills to be completed.    Today you received the following chemotherapy and/or immunotherapy agents 5FU      To help prevent nausea and vomiting after your treatment, we encourage you to take your nausea medication as directed.  BELOW ARE SYMPTOMS THAT SHOULD BE REPORTED IMMEDIATELY: *FEVER GREATER THAN 100.4 F (38 C) OR HIGHER *CHILLS OR SWEATING *NAUSEA AND VOMITING THAT IS NOT CONTROLLED WITH YOUR NAUSEA MEDICATION *UNUSUAL SHORTNESS OF BREATH *UNUSUAL BRUISING OR BLEEDING *URINARY PROBLEMS (pain or burning when urinating, or frequent urination) *BOWEL PROBLEMS (unusual diarrhea, constipation, pain near the anus) TENDERNESS IN MOUTH AND THROAT WITH OR WITHOUT PRESENCE OF ULCERS (sore throat, sores in mouth, or a toothache) UNUSUAL RASH, SWELLING OR PAIN  UNUSUAL VAGINAL DISCHARGE OR ITCHING   Items with * indicate a potential emergency and should be followed up as soon as possible or go to the Emergency Department if any problems should occur.  Please show the CHEMOTHERAPY ALERT CARD or IMMUNOTHERAPY ALERT CARD at check-in to the  Emergency Department and triage nurse. Should you have questions after your visit or need to cancel or reschedule your appointment, please contact [facility]  [phone] and follow the prompts.  Office hours are 8:00 a.m. to 4:30 p.m. Monday - Friday. Please note that voicemails left after 4:00 p.m. may not be returned until the following business day.  We are closed weekends and major holidays. You have access to a nurse at all times for urgent questions. Please call the main number to the clinic [phone] and follow the prompts.  For any non-urgent questions, you may also contact your provider using MyChart. We now offer e-Visits for anyone 34 and older to request care online for non-urgent symptoms. For details visit [email or web address].   Also download the MyChart app! Go to the app store, search "MyChart", open the app, select [facility], and log in with your MyChart username and password.  Masks are optional in the cancer centers. If you would like for your care team to wear a mask while they are taking care of you, please let them know. You may have one support person who is at least 46 years old accompany you for your appointments.

## 2021-12-08 ENCOUNTER — Other Ambulatory Visit (HOSPITAL_COMMUNITY): Payer: Self-pay

## 2021-12-08 ENCOUNTER — Other Ambulatory Visit: Payer: Self-pay | Admitting: Hematology & Oncology

## 2021-12-08 ENCOUNTER — Other Ambulatory Visit (HOSPITAL_BASED_OUTPATIENT_CLINIC_OR_DEPARTMENT_OTHER): Payer: Self-pay

## 2021-12-08 DIAGNOSIS — R110 Nausea: Secondary | ICD-10-CM

## 2021-12-08 DIAGNOSIS — C787 Secondary malignant neoplasm of liver and intrahepatic bile duct: Secondary | ICD-10-CM

## 2021-12-08 DIAGNOSIS — G893 Neoplasm related pain (acute) (chronic): Secondary | ICD-10-CM

## 2021-12-08 DIAGNOSIS — M546 Pain in thoracic spine: Secondary | ICD-10-CM

## 2021-12-08 DIAGNOSIS — C73 Malignant neoplasm of thyroid gland: Secondary | ICD-10-CM

## 2021-12-08 MED ORDER — PROMETHAZINE HCL 25 MG PO TABS
25.0000 mg | ORAL_TABLET | Freq: Four times a day (QID) | ORAL | 0 refills | Status: DC | PRN
  Filled 2021-12-08 – 2021-12-24 (×2): qty 120, 30d supply, fill #0

## 2021-12-08 MED ORDER — XTAMPZA ER 36 MG PO C12A
36.0000 mg | EXTENDED_RELEASE_CAPSULE | Freq: Two times a day (BID) | ORAL | 0 refills | Status: DC
  Filled 2021-12-08: qty 60, 30d supply, fill #0

## 2021-12-08 MED ORDER — TEMAZEPAM 30 MG PO CAPS
30.0000 mg | ORAL_CAPSULE | Freq: Every evening | ORAL | 0 refills | Status: DC | PRN
  Filled 2021-12-08: qty 15, 15d supply, fill #0
  Filled 2022-01-21: qty 15, 15d supply, fill #1

## 2021-12-08 MED ORDER — HYDROMORPHONE HCL 8 MG PO TABS
8.0000 mg | ORAL_TABLET | ORAL | 0 refills | Status: DC | PRN
  Filled 2021-12-08 – 2021-12-24 (×2): qty 120, 20d supply, fill #0

## 2021-12-09 ENCOUNTER — Inpatient Hospital Stay: Payer: BC Managed Care – PPO

## 2021-12-09 ENCOUNTER — Inpatient Hospital Stay (HOSPITAL_BASED_OUTPATIENT_CLINIC_OR_DEPARTMENT_OTHER): Payer: BC Managed Care – PPO | Admitting: Medical Oncology

## 2021-12-09 ENCOUNTER — Encounter: Payer: Self-pay | Admitting: Medical Oncology

## 2021-12-09 VITALS — BP 108/84 | HR 96 | Temp 98.7°F | Resp 18 | Wt 262.0 lb

## 2021-12-09 VITALS — Ht 70.0 in

## 2021-12-09 DIAGNOSIS — D509 Iron deficiency anemia, unspecified: Secondary | ICD-10-CM | POA: Diagnosis not present

## 2021-12-09 DIAGNOSIS — E039 Hypothyroidism, unspecified: Secondary | ICD-10-CM

## 2021-12-09 DIAGNOSIS — E291 Testicular hypofunction: Secondary | ICD-10-CM | POA: Diagnosis not present

## 2021-12-09 DIAGNOSIS — C73 Malignant neoplasm of thyroid gland: Secondary | ICD-10-CM

## 2021-12-09 LAB — CBC WITH DIFFERENTIAL (CANCER CENTER ONLY)
Abs Immature Granulocytes: 0.01 10*3/uL (ref 0.00–0.07)
Basophils Absolute: 0 10*3/uL (ref 0.0–0.1)
Basophils Relative: 1 %
Eosinophils Absolute: 0 10*3/uL (ref 0.0–0.5)
Eosinophils Relative: 1 %
HCT: 38.2 % — ABNORMAL LOW (ref 39.0–52.0)
Hemoglobin: 12.3 g/dL — ABNORMAL LOW (ref 13.0–17.0)
Immature Granulocytes: 0 %
Lymphocytes Relative: 6 %
Lymphs Abs: 0.2 10*3/uL — ABNORMAL LOW (ref 0.7–4.0)
MCH: 32.4 pg (ref 26.0–34.0)
MCHC: 32.2 g/dL (ref 30.0–36.0)
MCV: 100.5 fL — ABNORMAL HIGH (ref 80.0–100.0)
Monocytes Absolute: 0.4 10*3/uL (ref 0.1–1.0)
Monocytes Relative: 10 %
Neutro Abs: 3.4 10*3/uL (ref 1.7–7.7)
Neutrophils Relative %: 82 %
Platelet Count: 98 10*3/uL — ABNORMAL LOW (ref 150–400)
RBC: 3.8 MIL/uL — ABNORMAL LOW (ref 4.22–5.81)
RDW: 17.3 % — ABNORMAL HIGH (ref 11.5–15.5)
WBC Count: 4.1 10*3/uL (ref 4.0–10.5)
nRBC: 0 % (ref 0.0–0.2)

## 2021-12-09 LAB — CMP (CANCER CENTER ONLY)
ALT: 18 U/L (ref 0–44)
AST: 31 U/L (ref 15–41)
Albumin: 3.9 g/dL (ref 3.5–5.0)
Alkaline Phosphatase: 119 U/L (ref 38–126)
Anion gap: 7 (ref 5–15)
BUN: 11 mg/dL (ref 6–20)
CO2: 27 mmol/L (ref 22–32)
Calcium: 9.2 mg/dL (ref 8.9–10.3)
Chloride: 103 mmol/L (ref 98–111)
Creatinine: 0.99 mg/dL (ref 0.61–1.24)
GFR, Estimated: >60 mL/min (ref >60)
Glucose, Bld: 142 mg/dL — ABNORMAL HIGH (ref 70–99)
Potassium: 3.7 mmol/L (ref 3.5–5.1)
Sodium: 137 mmol/L (ref 135–145)
Total Bilirubin: 0.9 mg/dL (ref 0.3–1.2)
Total Protein: 6 g/dL — ABNORMAL LOW (ref 6.5–8.1)

## 2021-12-09 LAB — TSH: TSH: 32.461 u[IU]/mL — ABNORMAL HIGH (ref 0.350–4.500)

## 2021-12-09 LAB — IRON AND IRON BINDING CAPACITY (CC-WL,HP ONLY)
Iron: 93 ug/dL (ref 45–182)
Saturation Ratios: 28 % (ref 17.9–39.5)
TIBC: 333 ug/dL (ref 250–450)
UIBC: 240 ug/dL (ref 117–376)

## 2021-12-09 LAB — LACTATE DEHYDROGENASE: LDH: 248 U/L — ABNORMAL HIGH (ref 98–192)

## 2021-12-09 LAB — FERRITIN: Ferritin: 482 ng/mL — ABNORMAL HIGH (ref 24–336)

## 2021-12-09 MED ORDER — DEXTROSE 5 % IV SOLN: Freq: Once | INTRAVENOUS | Status: AC

## 2021-12-09 MED ORDER — SODIUM CHLORIDE 0.9 % IV SOLN
10.0000 mg | Freq: Once | INTRAVENOUS | Status: AC
  Administered 2021-12-09: 10 mg via INTRAVENOUS
  Filled 2021-12-09: qty 10

## 2021-12-09 MED ORDER — FLUOROURACIL CHEMO INJECTION 2.5 GM/50ML
400.0000 mg/m2 | Freq: Once | INTRAVENOUS | Status: AC
  Administered 2021-12-09: 950 mg via INTRAVENOUS
  Filled 2021-12-09: qty 19

## 2021-12-09 MED ORDER — SODIUM CHLORIDE 0.9% FLUSH: 10.0000 mL | INTRAVENOUS | Status: DC | PRN

## 2021-12-09 MED ORDER — OXALIPLATIN CHEMO INJECTION 100 MG/20ML
67.0000 mg/m2 | Freq: Once | INTRAVENOUS | Status: AC
  Administered 2021-12-09: 160 mg via INTRAVENOUS
  Filled 2021-12-09: qty 32

## 2021-12-09 MED ORDER — PALONOSETRON HCL INJECTION 0.25 MG/5ML
0.2500 mg | Freq: Once | INTRAVENOUS | Status: AC
  Administered 2021-12-09: 0.25 mg via INTRAVENOUS
  Filled 2021-12-09: qty 5

## 2021-12-09 MED ORDER — LEUCOVORIN CALCIUM INJECTION 350 MG
400.0000 mg/m2 | Freq: Once | INTRAVENOUS | Status: AC
  Administered 2021-12-09: 964 mg via INTRAVENOUS
  Filled 2021-12-09: qty 48.2

## 2021-12-09 MED ORDER — DEXTROSE 5 % IV SOLN: Freq: Once | INTRAVENOUS | Status: DC

## 2021-12-09 MED ORDER — SODIUM CHLORIDE 0.9 % IV SOLN
1900.0000 mg/m2 | INTRAVENOUS | Status: DC
  Administered 2021-12-09: 4600 mg via INTRAVENOUS
  Filled 2021-12-09: qty 92

## 2021-12-09 MED ORDER — LORAZEPAM 2 MG/ML IJ SOLN
0.5000 mg | Freq: Once | INTRAMUSCULAR | Status: AC
  Administered 2021-12-09: 0.5 mg via INTRAVENOUS
  Filled 2021-12-09: qty 1

## 2021-12-09 MED ORDER — HEPARIN SOD (PORK) LOCK FLUSH 100 UNIT/ML IV SOLN: 500.0000 [IU] | Freq: Once | INTRAVENOUS | Status: DC | PRN

## 2021-12-09 NOTE — Patient Instructions (Signed)
[facility]  Discharge Instructions: Thank you for choosing [facility] to provide your oncology and hematology care.   If you have a lab appointment with the [facility], please go directly to the [facility] and check in at the registration area.  Wear comfortable clothing and clothing appropriate for easy access to any Portacath or PICC line.   We strive to give you quality time with your provider. You may need to reschedule your appointment if you arrive late (15 or more minutes).  Arriving late affects you and other patients whose appointments are after yours.  Also, if you miss three or more appointments without notifying the office, you may be dismissed from the clinic at the provider's discretion.      For prescription refill requests, have your pharmacy contact our office and allow 72 hours for refills to be completed.    Today you received the following chemotherapy and/or immunotherapy agents 5FU, Oxaliplatin      To help prevent nausea and vomiting after your treatment, we encourage you to take your nausea medication as directed.  BELOW ARE SYMPTOMS THAT SHOULD BE REPORTED IMMEDIATELY: *FEVER GREATER THAN 100.4 F (38 C) OR HIGHER *CHILLS OR SWEATING *NAUSEA AND VOMITING THAT IS NOT CONTROLLED WITH YOUR NAUSEA MEDICATION *UNUSUAL SHORTNESS OF BREATH *UNUSUAL BRUISING OR BLEEDING *URINARY PROBLEMS (pain or burning when urinating, or frequent urination) *BOWEL PROBLEMS (unusual diarrhea, constipation, pain near the anus) TENDERNESS IN MOUTH AND THROAT WITH OR WITHOUT PRESENCE OF ULCERS (sore throat, sores in mouth, or a toothache) UNUSUAL RASH, SWELLING OR PAIN  UNUSUAL VAGINAL DISCHARGE OR ITCHING   Items with * indicate a potential emergency and should be followed up as soon as possible or go to the Emergency Department if any problems should occur.  Please show the CHEMOTHERAPY ALERT CARD or IMMUNOTHERAPY ALERT CARD at check-in  to the Emergency Department and triage nurse. Should you have questions after your visit or need to cancel or reschedule your appointment, please contact [facility]  [phone] and follow the prompts.  Office hours are 8:00 a.m. to 4:30 p.m. Monday - Friday. Please note that voicemails left after 4:00 p.m. may not be returned until the following business day.  We are closed weekends and major holidays. You have access to a nurse at all times for urgent questions. Please call the main number to the clinic [phone] and follow the prompts.  For any non-urgent questions, you may also contact your provider using MyChart. We now offer e-Visits for anyone 33 and older to request care online for non-urgent symptoms. For details visit [email or web address].   Also download the MyChart app! Go to the app store, search "MyChart", open the app, select [facility], and log in with your MyChart username and password.  Masks are optional in the cancer centers. If you would like for your care team to wear a mask while they are taking care of you, please let them know. You may have one support person who is at least 46 years old accompany you for your appointments.

## 2021-12-09 NOTE — Patient Instructions (Signed)

## 2021-12-09 NOTE — Progress Notes (Signed)
Hematology and Oncology Follow Up Visit  Brian [name] [id number] 29-Dec-1975 46 y.o. 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 + Bilateral pulmonary emboli without right heart strain  DVT left popliteal vein    Past Therapy: Cabometyx 40 mg po q day -- start on 04/17/2019 - d/c on 07/17/2019 for progression Lenvima 24 mg po q day -- on hold due to proteinuria Taxotere/CDDP -- s/p cycle 4 - started on 09/13/2019   Current Therapy:      Vinblastine q week x 4 weeks on / 1 week off -- s/p  cycle #2 -- start on 02/24/2021 -- d/c on 05/20/2021 Gavreto 400 mg po q day -- started on 05/17/2020  -- d/c on 06/16/2020 Adriamycin weekly 2 weeks on 1 week off - s/p cycle 8 --d/c on 04/16/2020 due to progression XRT for LEFT shoulder met -completed on 06/22/2021 Pembrolizumab 200 mg IV q 3 week -- s/p cycle #8 - start on 06/26/2020 -- d/c on 01/16/2021 due to progression Zometa 4 mg IV q 2 months - next dose on 12/2021 Eliquis 5 mg po BID FOLFOX -- s/p cycle #9 -- start on 07/08/2021   Interim History:  [name] is here today for follow-up.  He reports that he had a great weekend. He attended a wedding and then went fishing with his daughter. He caught 2 bass.   He reports that last night he started to have some post nasal drainage. No cough, sore throat, fever, SOB. He has not taken anything for this. Allergies vs "sinus infection". Otherwise he is feeling well.   Tolerating his FOLFOX well with scant nausea from time to time. Has lost a bit of weight but reports that he is eating and drinking well.   Wt Readings from Last 3 Encounters:  12/09/21 262 lb (118.8 kg)  11/26/21 266 lb (120.7 kg)  11/11/21 257 lb (116.6 kg)     Medications:  Allergies as of 12/09/2021       Reactions   Dextromethorphan-guaifenesin Other (See Comments)   Irregular heartbeat   Doxycycline Anaphylaxis, Nausea And Vomiting, Rash, Other (See  Comments)   "heart arrythmia" and "dyspepsia" (only oral doxycycline causes reaction)   Gadobutrol Hives, Other (See Comments)   Patient had MRI scan at [place]. Patient called one hour after he left imaging facility to report two "blisters" that came up on "back" of lip.    Gadolinium Derivatives Hives   Patient had MRI scan at [place]. Patient called one hour after he left imaging facility to report two "blisters" that came up on "back" of lip.    Guaifenesin Palpitations      Ibuprofen Hives, Itching   Lisinopril Other (See Comments)   Angioedema   Pantoprazole Itching   Tramadol Hives, Itching   Nexavar [sorafenib] Hives   Barium Rash   Developed redness around neck after drinking 1st bottle of Barocat; pt was given Benedryl by ED Mds to be "on the safe side" before drinking 2nd bottle   Chlorhexidine Hives        Medication List        Accurate as of December 09, 2021 10:01 AM. If you have any questions, ask your nurse or doctor.          cephALEXin 500 MG capsule Commonly known as: KEFLEX Take 1 capsule (500 mg total) by mouth 2 (two) times daily for 3 days.   diphenhydrAMINE  25 MG tablet Commonly known as: BENADRYL Take 2 tablets(50 mg total) by mouth one hour prior to CT Scan   Eliquis 5 MG Tabs tablet Generic drug: apixaban TAKE 1 TABLET (5 MG TOTAL) BY MOUTH 2 (TWO) TIMES DAILY.   EPINEPHrine 0.3 mg/0.3 mL Soaj injection Commonly known as: EpiPen 2-Pak USE AS DIRECTED FOR LIFE THREATENING ALLERGIC REACTIONS   famotidine 40 MG tablet Commonly known as: PEPCID Take 1 tablet (40 mg total) by mouth 2 (two) times daily.   Fusion Plus Caps Take 1 capsule by mouth daily.   HYDROmorphone 8 MG tablet Commonly known as: DILAUDID Take 1 tablet (8 mg total) by mouth every 4 (four) hours as needed for severe pain.   HYDROmorphone 8 MG tablet Commonly known as: DILAUDID Take 1 tablet (8 mg total) by mouth every 4 (four) hours as needed for  severe pain.   levothyroxine 200 MCG tablet Commonly known as: SYNTHROID Take one tablet (200 mcg dose) by mouth daily. Total:  288 mcg/day.   LORazepam 1 MG tablet Commonly known as: ATIVAN Take 1 tablet (1 mg total) by mouth every 6 (six) hours as needed for anxiety (for nausea and vomiting).   methylphenidate 10 MG tablet Commonly known as: RITALIN Take 1 tablet (10 mg total) by mouth 2 (two) times daily.   metoCLOPramide 10 MG tablet Commonly known as: REGLAN Take 1 tablet (10 mg total) by mouth every 6 (six) hours as needed for nausea or vomiting.   metoprolol tartrate 50 MG tablet Commonly known as: Lopressor Take 1 tablet (50 mg total) by mouth 2 (two) times daily.   multivitamin capsule Take 1 capsule by mouth daily.   OLANZapine 10 MG tablet Commonly known as: ZYPREXA TAKE 1 TABLET (10 MG TOTAL) BY MOUTH AT BEDTIME.   PEG 3350 17 GM/SCOOP Powd Mix 17 grams (1 Capful) in 8 ounces of liquid and drink by mouth daily as needed.   potassium chloride SA 20 MEQ tablet Commonly known as: KLOR-CON M Take 1 tablet (20 mEq total) by mouth daily.   predniSONE 50 MG tablet Commonly known as: DELTASONE TAKE 1 TABLET BY MOUTH 13 HOURS BEFORE CT SCAN, 1 TABLET 7 HOURS BEFORE CT SCAN, AND 1 TABLET 1 HOUR BEFORE CT SCAN   prochlorperazine 10 MG tablet Commonly known as: COMPAZINE Take 1 tablet (10 mg total) by mouth every 6 (six) hours as needed (Nausea or vomiting).   promethazine 25 MG tablet Commonly known as: PHENERGAN Take 1 tablet (25 mg total) by mouth every 6 (six) hours as needed for nausea and vomiting.   senna-docusate 8.6-50 MG tablet Commonly known as: Senokot-S Take 1 tablet by mouth at bedtime as needed for mild constipation.   tamsulosin 0.4 MG Caps capsule Commonly known as: Flomax Take 1 capsule (0.4 mg total) by mouth daily after supper.   temazepam 30 MG capsule Commonly known as: RESTORIL Take 1 capsule (30 mg total) by mouth at bedtime as needed  for sleep.   Xtampza ER 36 MG C12a Generic drug: oxyCODONE ER Take 1 capsule (36 mg total) by mouth every 12 (twelve) hours        Allergies:  Allergies  Allergen Reactions   Dextromethorphan-Guaifenesin Other (See Comments)    Irregular heartbeat    Doxycycline Anaphylaxis, Nausea And Vomiting, Rash and Other (See Comments)    "heart arrythmia" and "dyspepsia" (only oral doxycycline causes reaction)   Gadobutrol Hives and Other (See Comments)    Patient had MRI scan at  [facility]. Patient called one hour after he left imaging facility to report two "blisters" that came up on "back" of lip.     Gadolinium Derivatives Hives    Patient had MRI scan at [place]. Patient called one hour after he left imaging facility to report two "blisters" that came up on "back" of lip.    Guaifenesin Palpitations        Ibuprofen Hives and Itching   Lisinopril Other (See Comments)    Angioedema   Pantoprazole Itching   Tramadol Hives and Itching   Nexavar [Sorafenib] Hives   Barium Rash    Developed redness around neck after drinking 1st bottle of Barocat; pt was given Benedryl by ED Mds to be "on the safe side" before drinking 2nd bottle   Chlorhexidine Hives    Past Medical History, Surgical history, Social history, and Family History were reviewed and updated.  Review of Systems: Review of Systems  Constitutional:  Positive for malaise/fatigue.  HENT:  Positive for sinus pain.   Eyes: Negative.   Respiratory: Negative.    Cardiovascular: Negative.   Gastrointestinal:  Negative for abdominal pain, constipation, heartburn and nausea.  Genitourinary: Negative.   Musculoskeletal:  Positive for joint pain.  Skin: Negative.   Neurological: Negative.   Endo/Heme/Allergies: Negative.   Psychiatric/Behavioral: Negative.       Physical Exam:  weight is 262 lb (118.8 kg). His oral temperature is 98.7 F (37.1 C). His blood pressure is 108/84 and his pulse is 96. His  respiration is 18 and oxygen saturation is 95%.   Wt Readings from Last 3 Encounters:  12/09/21 262 lb (118.8 kg)  11/26/21 266 lb (120.7 kg)  11/11/21 257 lb (116.6 kg)    Physical Exam Vitals reviewed.  HENT:     Head: Normocephalic and atraumatic.     Nose:     Comments: No maxillary or frontal sinus tenderness to palpation     Mouth/Throat:     Mouth: Mucous membranes are moist.  Eyes:     Pupils: Pupils are equal, round, and reactive to light.  Cardiovascular:     Rate and Rhythm: Normal rate and regular rhythm.     Heart sounds: Normal heart sounds.     Comments: Cardiac exam shows a tachycardic rate.  He has occasional extra beat.  I do not hear any obvious murmurs or rubs. Pulmonary:     Effort: Pulmonary effort is normal.     Breath sounds: Normal breath sounds.  Abdominal:     General: Bowel sounds are normal.     Palpations: Abdomen is soft.  Musculoskeletal:        General: No tenderness or deformity. Normal range of motion.     Cervical back: Normal range of motion.  Lymphadenopathy:     Cervical: No cervical adenopathy.  Skin:    General: Skin is warm and dry.     Findings: No erythema or rash.  Neurological:     Mental Status: He is alert and oriented to person, place, and time.  Psychiatric:        Behavior: Behavior normal.        Thought Content: Thought content normal.        Judgment: Judgment normal.      Lab Results  Component Value Date   WBC 4.1 12/09/2021   HGB 12.3 (L) 12/09/2021   HCT 38.2 (L) 12/09/2021   MCV 100.5 (H) 12/09/2021   PLT 98 (L) 12/09/2021   Lab  Results  Component Value Date   FERRITIN 457 (H) 11/26/2021   IRON 51 11/26/2021   TIBC 319 11/26/2021   UIBC 268 11/26/2021   IRONPCTSAT 16 (L) 11/26/2021   Lab Results  Component Value Date   RETICCTPCT 3.9 (H) 08/08/2020   RBC 3.80 (L) 12/09/2021   No results found for: "KPAFRELGTCHN", "LAMBDASER", "KAPLAMBRATIO" No results found for: "IGGSERUM", "IGA",  "IGMSERUM" No results found for: "TOTALPROTELP", "ALBUMINELP", "A1GS", "A2GS", "BETS", "BETA2SER", "GAMS", "MSPIKE", "SPEI"   Chemistry      Component Value Date/Time   NA 137 12/09/2021 0834   NA 141 02/06/2019 1210   K 3.7 12/09/2021 0834   CL 103 12/09/2021 0834   CO2 27 12/09/2021 0834   BUN 11 12/09/2021 0834   BUN 8 02/06/2019 1210   CREATININE 0.99 12/09/2021 0834      Component Value Date/Time   CALCIUM 9.2 12/09/2021 0834   ALKPHOS 119 12/09/2021 0834   AST 31 12/09/2021 0834   ALT 18 12/09/2021 0834   BILITOT 0.9 12/09/2021 0834      Encounter Diagnoses  Name Primary?   Primary cancer of thyroid with metastasis to other site [facility])    Iron deficiency anemia, unspecified iron deficiency anemia type Yes   Hypotestosteronemia in male    Acquired hypothyroidism     Impression and Plan: 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   FOLFOX has been tolerated well and fairly effective. Discussed risks/benefits with treatment today and reviewed his labs. Ok to proceed forward. Should his sinus congestion worsen he may need an ABX however at this time it appears to be due to allergies vs mild viral illness.   Per Dr. Guinevere Ferrari last note he will complete 3 more treatments and then have imaging repeated.   Continue testosterone injections as they have helped with his quality of life- his last was on 11/28/2021. Has been getting this every 2 weeks or so. Not due today.   Due for his Zometa in November.  Disposition: Today: FOLFOX Day 1 Cycle 3 RTC 2 weeks MD, Labs (CBC w/ CMP, LDH, TSH, Testosterone, Ferritin, Iron/TIBC) + FOLFOX +Zometa + Testosterone    Hughie Closs, PA-C 10/24/202310:01 AM

## 2021-12-09 NOTE — Progress Notes (Signed)
Ok to treat with platelets of 98 per Nelwyn Salisbury PA.

## 2021-12-10 ENCOUNTER — Telehealth: Payer: Self-pay

## 2021-12-10 LAB — TESTOSTERONE: Testosterone: 651 ng/dL (ref 264–916)

## 2021-12-11 ENCOUNTER — Other Ambulatory Visit (HOSPITAL_BASED_OUTPATIENT_CLINIC_OR_DEPARTMENT_OTHER): Payer: Self-pay

## 2021-12-11 ENCOUNTER — Inpatient Hospital Stay: Payer: BC Managed Care – PPO

## 2021-12-11 VITALS — BP 119/84 | HR 93 | Temp 98.2°F | Resp 18

## 2021-12-11 DIAGNOSIS — C73 Malignant neoplasm of thyroid gland: Secondary | ICD-10-CM | POA: Diagnosis not present

## 2021-12-11 MED ORDER — HEPARIN SOD (PORK) LOCK FLUSH 100 UNIT/ML IV SOLN
500.0000 [IU] | Freq: Once | INTRAVENOUS | Status: AC | PRN
  Administered 2021-12-11: 500 [IU]

## 2021-12-11 MED ORDER — SODIUM CHLORIDE 0.9% FLUSH
10.0000 mL | INTRAVENOUS | Status: DC | PRN
  Administered 2021-12-11: 10 mL

## 2021-12-11 NOTE — Patient Instructions (Signed)

## 2021-12-12 ENCOUNTER — Other Ambulatory Visit: Payer: Self-pay

## 2021-12-13 ENCOUNTER — Other Ambulatory Visit: Payer: Self-pay

## 2021-12-22 ENCOUNTER — Encounter: Payer: Self-pay | Admitting: Family

## 2021-12-22 ENCOUNTER — Other Ambulatory Visit: Payer: Self-pay

## 2021-12-22 ENCOUNTER — Inpatient Hospital Stay: Payer: BC Managed Care – PPO

## 2021-12-22 ENCOUNTER — Inpatient Hospital Stay: Payer: BC Managed Care – PPO | Attending: Hematology & Oncology

## 2021-12-22 ENCOUNTER — Inpatient Hospital Stay (HOSPITAL_BASED_OUTPATIENT_CLINIC_OR_DEPARTMENT_OTHER): Payer: BC Managed Care – PPO | Admitting: Family

## 2021-12-22 VITALS — BP 115/78 | HR 73 | Temp 98.2°F | Resp 18 | Ht 70.0 in | Wt 270.0 lb

## 2021-12-22 DIAGNOSIS — E291 Testicular hypofunction: Secondary | ICD-10-CM

## 2021-12-22 DIAGNOSIS — D509 Iron deficiency anemia, unspecified: Secondary | ICD-10-CM

## 2021-12-22 DIAGNOSIS — C7951 Secondary malignant neoplasm of bone: Secondary | ICD-10-CM | POA: Diagnosis not present

## 2021-12-22 DIAGNOSIS — C787 Secondary malignant neoplasm of liver and intrahepatic bile duct: Secondary | ICD-10-CM | POA: Insufficient documentation

## 2021-12-22 DIAGNOSIS — C799 Secondary malignant neoplasm of unspecified site: Secondary | ICD-10-CM

## 2021-12-22 DIAGNOSIS — Z5111 Encounter for antineoplastic chemotherapy: Secondary | ICD-10-CM | POA: Diagnosis present

## 2021-12-22 DIAGNOSIS — Z79899 Other long term (current) drug therapy: Secondary | ICD-10-CM | POA: Diagnosis not present

## 2021-12-22 DIAGNOSIS — C73 Malignant neoplasm of thyroid gland: Secondary | ICD-10-CM | POA: Diagnosis present

## 2021-12-22 LAB — CMP (CANCER CENTER ONLY)
ALT: 22 U/L (ref 0–44)
AST: 39 U/L (ref 15–41)
Albumin: 4.1 g/dL (ref 3.5–5.0)
Alkaline Phosphatase: 111 U/L (ref 38–126)
Anion gap: 7 (ref 5–15)
BUN: 12 mg/dL (ref 6–20)
CO2: 26 mmol/L (ref 22–32)
Calcium: 9.3 mg/dL (ref 8.9–10.3)
Chloride: 102 mmol/L (ref 98–111)
Creatinine: 0.97 mg/dL (ref 0.61–1.24)
GFR, Estimated: >60 mL/min (ref >60)
Glucose, Bld: 136 mg/dL — ABNORMAL HIGH (ref 70–99)
Potassium: 4.1 mmol/L (ref 3.5–5.1)
Sodium: 135 mmol/L (ref 135–145)
Total Bilirubin: 0.9 mg/dL (ref 0.3–1.2)
Total Protein: 6.4 g/dL — ABNORMAL LOW (ref 6.5–8.1)

## 2021-12-22 LAB — CBC WITH DIFFERENTIAL/PLATELET
Abs Immature Granulocytes: 0.01 10*3/uL (ref 0.00–0.07)
Basophils Absolute: 0 10*3/uL (ref 0.0–0.1)
Basophils Relative: 1 %
Eosinophils Absolute: 0.1 10*3/uL (ref 0.0–0.5)
Eosinophils Relative: 2 %
HCT: 39.8 % (ref 39.0–52.0)
Hemoglobin: 12.6 g/dL — ABNORMAL LOW (ref 13.0–17.0)
Immature Granulocytes: 0 %
Lymphocytes Relative: 11 %
Lymphs Abs: 0.3 10*3/uL — ABNORMAL LOW (ref 0.7–4.0)
MCH: 32.1 pg (ref 26.0–34.0)
MCHC: 31.7 g/dL (ref 30.0–36.0)
MCV: 101.5 fL — ABNORMAL HIGH (ref 80.0–100.0)
Monocytes Absolute: 0.6 10*3/uL (ref 0.1–1.0)
Monocytes Relative: 22 %
Neutro Abs: 1.7 10*3/uL (ref 1.7–7.7)
Neutrophils Relative %: 64 %
Platelets: 109 10*3/uL — ABNORMAL LOW (ref 150–400)
RBC: 3.92 MIL/uL — ABNORMAL LOW (ref 4.22–5.81)
RDW: 17.9 % — ABNORMAL HIGH (ref 11.5–15.5)
WBC: 2.7 10*3/uL — ABNORMAL LOW (ref 4.0–10.5)
nRBC: 0 % (ref 0.0–0.2)

## 2021-12-22 LAB — LACTATE DEHYDROGENASE: LDH: 298 U/L — ABNORMAL HIGH (ref 98–192)

## 2021-12-22 LAB — IRON AND IRON BINDING CAPACITY (CC-WL,HP ONLY)
Iron: 161 ug/dL (ref 45–182)
Saturation Ratios: 43 % — ABNORMAL HIGH (ref 17.9–39.5)
TIBC: 371 ug/dL (ref 250–450)
UIBC: 210 ug/dL (ref 117–376)

## 2021-12-22 LAB — FERRITIN: Ferritin: 447 ng/mL — ABNORMAL HIGH (ref 24–336)

## 2021-12-22 MED ORDER — PALONOSETRON HCL INJECTION 0.25 MG/5ML
0.2500 mg | Freq: Once | INTRAVENOUS | Status: AC
  Administered 2021-12-22: 0.25 mg via INTRAVENOUS
  Filled 2021-12-22: qty 5

## 2021-12-22 MED ORDER — FLUOROURACIL CHEMO INJECTION 2.5 GM/50ML
400.0000 mg/m2 | Freq: Once | INTRAVENOUS | Status: AC
  Administered 2021-12-22: 950 mg via INTRAVENOUS
  Filled 2021-12-22: qty 19

## 2021-12-22 MED ORDER — DEXTROSE 5 % IV SOLN: Freq: Once | INTRAVENOUS | Status: AC

## 2021-12-22 MED ORDER — OXALIPLATIN CHEMO INJECTION 100 MG/20ML
67.0000 mg/m2 | Freq: Once | INTRAVENOUS | Status: AC
  Administered 2021-12-22: 160 mg via INTRAVENOUS
  Filled 2021-12-22: qty 32

## 2021-12-22 MED ORDER — LEUCOVORIN CALCIUM INJECTION 350 MG
400.0000 mg/m2 | Freq: Once | INTRAVENOUS | Status: AC
  Administered 2021-12-22: 964 mg via INTRAVENOUS
  Filled 2021-12-22: qty 48.2

## 2021-12-22 MED ORDER — TESTOSTERONE CYPIONATE 200 MG/ML IM SOLN
400.0000 mg | INTRAMUSCULAR | Status: DC
  Administered 2021-12-22: 400 mg via INTRAMUSCULAR
  Filled 2021-12-22: qty 2

## 2021-12-22 MED ORDER — ZOLEDRONIC ACID 4 MG/100ML IV SOLN: 4.0000 mg | Freq: Once | INTRAVENOUS | Status: DC

## 2021-12-22 MED ORDER — SODIUM CHLORIDE 0.9 % IV SOLN
1900.0000 mg/m2 | INTRAVENOUS | Status: DC
  Administered 2021-12-22: 4600 mg via INTRAVENOUS
  Filled 2021-12-22: qty 92

## 2021-12-22 MED ORDER — HEPARIN SOD (PORK) LOCK FLUSH 100 UNIT/ML IV SOLN: 500.0000 [IU] | Freq: Once | INTRAVENOUS | Status: DC | PRN

## 2021-12-22 MED ORDER — SODIUM CHLORIDE 0.9% FLUSH: 10.0000 mL | INTRAVENOUS | Status: DC | PRN

## 2021-12-22 MED ORDER — SODIUM CHLORIDE 0.9 % IV SOLN
10.0000 mg | Freq: Once | INTRAVENOUS | Status: AC
  Administered 2021-12-22: 10 mg via INTRAVENOUS
  Filled 2021-12-22: qty 10

## 2021-12-22 NOTE — Progress Notes (Signed)
Hematology and Oncology Follow Up Visit  MIKALE SILVERSMITH [id number] 10-11-1975 46 y.o. 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 + Bilateral pulmonary emboli without right heart strain  DVT left popliteal vein    Past Therapy: Cabometyx 40 mg po q day -- start on 04/17/2019 - d/c on 07/17/2019 for progression Lenvima 24 mg po q day -- on hold due to proteinuria Taxotere/CDDP -- s/p cycle 4 - started on 09/13/2019 Adriamycin weekly 2 weeks on 1 week off - s/p cycle 8 --d/c on 04/16/2020 due to  progression Gavreto 400 mg po q day -- started on 05/17/2020  -- d/c on 06/16/2020 Pembrolizumab 200 mg IV q 3 week -- s/p cycle #8 - start on 06/26/2020 -- d/c on 01/16/2021 due to progression Vinblastine q week x 4 weeks on / 1 week off -- s/p  cycle #2 -- start on 02/24/2021 -- d/c on 05/20/2021 XRT for LEFT shoulder met -completed on 06/22/2021   Current Therapy:     FOLFOX -- s/p cycle 3 -- start on 07/08/2021 Zometa 4 mg IV q 2 months - next dose on 02/2022 Eliquis 5 mg po BID   Interim History:  [name] is here today for follow-up and treatment. He is doing fairly well but does note fatigue at times. He states that this happens when his WBC count is decreased. WBC count today is 2.7.  He has had green phlegm 1-2 times every other day or so.  He denies fever, chills, cough, rash, dizziness, SOB, chest pain, palpitations, abdominal pain or changes in bowel or bladder habits.  He has occasional nausea. No vomiting.  He has had pain in the right shoulder which he feels may be muscle spasms. We will have him try flexeril and see if this gives him some relief.  No falls or syncope reported.  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  No blood loss, abnormal bruising, no petechiae.  Appetite and hydration have been good. Weight is stable at 270 lbs.   ECOG Performance Status: 1 - Symptomatic but completely  ambulatory  Medications:  Allergies as of 12/22/2021       Reactions   Dextromethorphan-guaifenesin Other (See Comments)   Irregular heartbeat   Doxycycline Anaphylaxis, Nausea And Vomiting, Rash, Other (See Comments)   "heart arrythmia" and "dyspepsia" (only oral doxycycline causes reaction)   Gadobutrol Hives, Other (See Comments)   Patient had MRI scan at [place]. Patient called one hour after he left imaging facility to report two "blisters" that came up on "back" of lip.    Gadolinium Derivatives Hives   Patient had MRI scan at [place]. Patient called one hour after he left imaging facility to report two "blisters" that came up on "back" of lip.    Guaifenesin Palpitations      Ibuprofen Hives, Itching   Lisinopril Other (See Comments)   Angioedema   Pantoprazole Itching   Tramadol Hives, Itching   Nexavar [sorafenib] Hives   Barium Rash   Developed redness around neck after drinking 1st bottle of Barocat; pt was given Benedryl by ED Mds to be "on the safe side" before drinking 2nd bottle   Chlorhexidine Hives        Medication List        Accurate as of December 22, 2021  9:24 AM. If you have any questions, ask your nurse or doctor.  cephALEXin 500 MG capsule Commonly known as: KEFLEX Take 1 capsule (500 mg total) by mouth 2 (two) times daily for 3 days.   diphenhydrAMINE 25 MG tablet Commonly known as: BENADRYL Take 2 tablets(50 mg total) by mouth one hour prior to CT Scan   Eliquis 5 MG Tabs tablet Generic drug: apixaban TAKE 1 TABLET (5 MG TOTAL) BY MOUTH 2 (TWO) TIMES DAILY.   EPINEPHrine 0.3 mg/0.3 mL Soaj injection Commonly known as: EpiPen 2-Pak USE AS DIRECTED FOR LIFE THREATENING ALLERGIC REACTIONS   famotidine 40 MG tablet Commonly known as: PEPCID Take 1 tablet (40 mg total) by mouth 2 (two) times daily.   Fusion Plus Caps Take 1 capsule by mouth daily.   HYDROmorphone 8 MG tablet Commonly known as:  DILAUDID Take 1 tablet (8 mg total) by mouth every 4 (four) hours as needed for severe pain.   HYDROmorphone 8 MG tablet Commonly known as: DILAUDID Take 1 tablet (8 mg total) by mouth every 4 (four) hours as needed for severe pain.   levothyroxine 200 MCG tablet Commonly known as: SYNTHROID Take one tablet (200 mcg dose) by mouth daily. Total:  288 mcg/day.   LORazepam 1 MG tablet Commonly known as: ATIVAN Take 1 tablet (1 mg total) by mouth every 6 (six) hours as needed for anxiety (for nausea and vomiting).   methylphenidate 10 MG tablet Commonly known as: RITALIN Take 1 tablet (10 mg total) by mouth 2 (two) times daily.   metoCLOPramide 10 MG tablet Commonly known as: REGLAN Take 1 tablet (10 mg total) by mouth every 6 (six) hours as needed for nausea or vomiting.   metoprolol tartrate 50 MG tablet Commonly known as: Lopressor Take 1 tablet (50 mg total) by mouth 2 (two) times daily.   multivitamin capsule Take 1 capsule by mouth daily.   OLANZapine 10 MG tablet Commonly known as: ZYPREXA TAKE 1 TABLET (10 MG TOTAL) BY MOUTH AT BEDTIME.   PEG 3350 17 GM/SCOOP Powd Mix 17 grams (1 Capful) in 8 ounces of liquid and drink by mouth daily as needed.   potassium chloride SA 20 MEQ tablet Commonly known as: KLOR-CON M Take 1 tablet (20 mEq total) by mouth daily.   predniSONE 50 MG tablet Commonly known as: DELTASONE TAKE 1 TABLET BY MOUTH 13 HOURS BEFORE CT SCAN, 1 TABLET 7 HOURS BEFORE CT SCAN, AND 1 TABLET 1 HOUR BEFORE CT SCAN   prochlorperazine 10 MG tablet Commonly known as: COMPAZINE Take 1 tablet (10 mg total) by mouth every 6 (six) hours as needed (Nausea or vomiting).   promethazine 25 MG tablet Commonly known as: PHENERGAN Take 1 tablet (25 mg total) by mouth every 6 (six) hours as needed for nausea and vomiting.   senna-docusate 8.6-50 MG tablet Commonly known as: Senokot-S Take 1 tablet by mouth at bedtime as needed for mild constipation.   tamsulosin  0.4 MG Caps capsule Commonly known as: Flomax Take 1 capsule (0.4 mg total) by mouth daily after supper.   temazepam 30 MG capsule Commonly known as: RESTORIL Take 1 capsule (30 mg total) by mouth at bedtime as needed for sleep.   Xtampza ER 36 MG C12a Generic drug: oxyCODONE ER Take 1 capsule (36 mg total) by mouth every 12 (twelve) hours        Allergies:  Allergies  Allergen Reactions   Dextromethorphan-Guaifenesin Other (See Comments)    Irregular heartbeat    Doxycycline Anaphylaxis, Nausea And Vomiting, Rash and Other (See Comments)    "  heart arrythmia" and "dyspepsia" (only oral doxycycline causes reaction)   Gadobutrol Hives and Other (See Comments)    Patient had MRI scan at [place]. Patient called one hour after he left imaging facility to report two "blisters" that came up on "back" of lip.     Gadolinium Derivatives Hives    Patient had MRI scan at [place]. Patient called one hour after he left imaging facility to report two "blisters" that came up on "back" of lip.    Guaifenesin Palpitations        Ibuprofen Hives and Itching   Lisinopril Other (See Comments)    Angioedema   Pantoprazole Itching   Tramadol Hives and Itching   Nexavar [Sorafenib] Hives   Barium Rash    Developed redness around neck after drinking 1st bottle of Barocat; pt was given Benedryl by ED Mds to be "on the safe side" before drinking 2nd bottle   Chlorhexidine Hives    Past Medical History, Surgical history, Social history, and Family History were reviewed and updated.  Review of Systems: All other 10 point review of systems is negative.   Physical Exam:  vitals were not taken for this visit.   Wt Readings from Last 3 Encounters:  12/09/21 262 lb (118.8 kg)  11/26/21 266 lb (120.7 kg)  11/11/21 257 lb (116.6 kg)    Ocular: Sclerae unicteric, pupils equal, round and reactive to light Ear-nose-throat: Oropharynx clear, dentition fair Lymphatic: No  cervical or supraclavicular adenopathy Lungs no rales or rhonchi, good excursion bilaterally Heart regular rate and rhythm, no murmur appreciated Abd soft, nontender, positive bowel sounds MSK no focal spinal tenderness, no joint edema Neuro: non-focal, well-oriented, appropriate affect Breasts: Deferred   Lab Results  Component Value Date   WBC 2.7 (L) 12/22/2021   HGB 12.6 (L) 12/22/2021   HCT 39.8 12/22/2021   MCV 101.5 (H) 12/22/2021   PLT 109 (L) 12/22/2021   Lab Results  Component Value Date   FERRITIN 482 (H) 12/09/2021   IRON 93 12/09/2021   TIBC 333 12/09/2021   UIBC 240 12/09/2021   IRONPCTSAT 28 12/09/2021   Lab Results  Component Value Date   RETICCTPCT 3.9 (H) 08/08/2020   RBC 3.92 (L) 12/22/2021   No results found for: "KPAFRELGTCHN", "LAMBDASER", "KAPLAMBRATIO" No results found for: "IGGSERUM", "IGA", "IGMSERUM" No results found for: "TOTALPROTELP", "ALBUMINELP", "A1GS", "A2GS", "BETS", "BETA2SER", "GAMS", "MSPIKE", "SPEI"   Chemistry      Component Value Date/Time   NA 137 12/09/2021 0834   NA 141 02/06/2019 1210   K 3.7 12/09/2021 0834   CL 103 12/09/2021 0834   CO2 27 12/09/2021 0834   BUN 11 12/09/2021 0834   BUN 8 02/06/2019 1210   CREATININE 0.99 12/09/2021 0834      Component Value Date/Time   CALCIUM 9.2 12/09/2021 0834   ALKPHOS 119 12/09/2021 0834   AST 31 12/09/2021 0834   ALT 18 12/09/2021 0834   BILITOT 0.9 12/09/2021 0834       Impression and Plan: 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   We will proceed with cycle 4 today as planned per Dr. Marin Olp including Zometa.   We will repeat scans after cycle 6.  Follow-up in 2 weeks.   Lottie Dawson, NP 11/6/20239:24 AM

## 2021-12-22 NOTE — Patient Instructions (Signed)

## 2021-12-22 NOTE — Patient Instructions (Signed)
[facility] [place]  Discharge Instructions: Thank you for choosing [facility] to provide your oncology and hematology care.   If you have a lab appointment with the [facility], please go directly to the [facility] and check in at the registration area.  Wear comfortable clothing and clothing appropriate for easy access to any Portacath or PICC line.   We strive to give you quality time with your provider. You may need to reschedule your appointment if you arrive late (15 or more minutes).  Arriving late affects you and other patients whose appointments are after yours.  Also, if you miss three or more appointments without notifying the office, you may be dismissed from the clinic at the provider's discretion.      For prescription refill requests, have your pharmacy contact our office and allow 72 hours for refills to be completed.    Today you received the following chemotherapy and/or immunotherapy agents FOLFOX      To help prevent nausea and vomiting after your treatment, we encourage you to take your nausea medication as directed.  BELOW ARE SYMPTOMS THAT SHOULD BE REPORTED IMMEDIATELY: *FEVER GREATER THAN 100.4 F (38 C) OR HIGHER *CHILLS OR SWEATING *NAUSEA AND VOMITING THAT IS NOT CONTROLLED WITH YOUR NAUSEA MEDICATION *UNUSUAL SHORTNESS OF BREATH *UNUSUAL BRUISING OR BLEEDING *URINARY PROBLEMS (pain or burning when urinating, or frequent urination) *BOWEL PROBLEMS (unusual diarrhea, constipation, pain near the anus) TENDERNESS IN MOUTH AND THROAT WITH OR WITHOUT PRESENCE OF ULCERS (sore throat, sores in mouth, or a toothache) UNUSUAL RASH, SWELLING OR PAIN  UNUSUAL VAGINAL DISCHARGE OR ITCHING   Items with * indicate a potential emergency and should be followed up as soon as possible or go to the Emergency Department if any problems should occur.  Please show the CHEMOTHERAPY ALERT CARD or IMMUNOTHERAPY ALERT CARD at check-in to the  Emergency Department and triage nurse. Should you have questions after your visit or need to cancel or reschedule your appointment, please contact [facility]  [phone] and follow the prompts.  Office hours are 8:00 a.m. to 4:30 p.m. Monday - Friday. Please note that voicemails left after 4:00 p.m. may not be returned until the following business day.  We are closed weekends and major holidays. You have access to a nurse at all times for urgent questions. Please call the main number to the clinic [phone] and follow the prompts.  For any non-urgent questions, you may also contact your provider using MyChart. We now offer e-Visits for anyone 29 and older to request care online for non-urgent symptoms. For details visit [email or web address].   Also download the MyChart app! Go to the app store, search "MyChart", open the app, select [facility], and log in with your MyChart username and password.  Masks are optional in the cancer centers. If you would like for your care team to wear a mask while they are taking care of you, please let them know. You may have one support person who is at least 46 years old accompany you for your appointments.

## 2021-12-23 ENCOUNTER — Other Ambulatory Visit (HOSPITAL_BASED_OUTPATIENT_CLINIC_OR_DEPARTMENT_OTHER): Payer: Self-pay

## 2021-12-23 ENCOUNTER — Other Ambulatory Visit: Payer: Self-pay | Admitting: Family

## 2021-12-23 DIAGNOSIS — M62838 Other muscle spasm: Secondary | ICD-10-CM

## 2021-12-23 DIAGNOSIS — C73 Malignant neoplasm of thyroid gland: Secondary | ICD-10-CM

## 2021-12-23 LAB — TESTOSTERONE: Testosterone: 291 ng/dL (ref 264–916)

## 2021-12-23 MED ORDER — CYCLOBENZAPRINE HCL 10 MG PO TABS
10.0000 mg | ORAL_TABLET | Freq: Three times a day (TID) | ORAL | 1 refills | Status: DC | PRN
  Filled 2021-12-23: qty 30, 10d supply, fill #0
  Filled 2022-01-21: qty 30, 10d supply, fill #1

## 2021-12-24 ENCOUNTER — Other Ambulatory Visit: Payer: Self-pay | Admitting: Family

## 2021-12-24 ENCOUNTER — Other Ambulatory Visit (HOSPITAL_BASED_OUTPATIENT_CLINIC_OR_DEPARTMENT_OTHER): Payer: Self-pay

## 2021-12-24 ENCOUNTER — Inpatient Hospital Stay: Payer: BC Managed Care – PPO

## 2021-12-24 VITALS — BP 130/77 | HR 74 | Temp 98.6°F | Resp 17

## 2021-12-24 DIAGNOSIS — C73 Malignant neoplasm of thyroid gland: Secondary | ICD-10-CM

## 2021-12-24 DIAGNOSIS — R0981 Nasal congestion: Secondary | ICD-10-CM

## 2021-12-24 MED ORDER — AZITHROMYCIN 250 MG PO TABS
ORAL_TABLET | ORAL | 0 refills | Status: DC
  Filled 2021-12-24: qty 6, 5d supply, fill #0

## 2021-12-24 MED ORDER — SODIUM CHLORIDE 0.9% FLUSH
10.0000 mL | INTRAVENOUS | Status: DC | PRN
  Administered 2021-12-24: 10 mL

## 2021-12-24 MED ORDER — HEPARIN SOD (PORK) LOCK FLUSH 100 UNIT/ML IV SOLN
500.0000 [IU] | Freq: Once | INTRAVENOUS | Status: AC | PRN
  Administered 2021-12-24: 500 [IU]

## 2021-12-24 NOTE — Patient Instructions (Signed)

## 2021-12-25 ENCOUNTER — Other Ambulatory Visit: Payer: Self-pay

## 2022-01-05 ENCOUNTER — Inpatient Hospital Stay (HOSPITAL_BASED_OUTPATIENT_CLINIC_OR_DEPARTMENT_OTHER): Payer: BC Managed Care – PPO | Admitting: Hematology & Oncology

## 2022-01-05 ENCOUNTER — Inpatient Hospital Stay: Payer: BC Managed Care – PPO

## 2022-01-05 ENCOUNTER — Encounter: Payer: Self-pay | Admitting: Hematology & Oncology

## 2022-01-05 ENCOUNTER — Other Ambulatory Visit: Payer: Self-pay

## 2022-01-05 VITALS — BP 122/81 | HR 94 | Temp 98.3°F | Resp 18 | Ht 70.0 in | Wt 275.0 lb

## 2022-01-05 DIAGNOSIS — D509 Iron deficiency anemia, unspecified: Secondary | ICD-10-CM

## 2022-01-05 DIAGNOSIS — C73 Malignant neoplasm of thyroid gland: Secondary | ICD-10-CM

## 2022-01-05 DIAGNOSIS — E291 Testicular hypofunction: Secondary | ICD-10-CM

## 2022-01-05 LAB — IRON AND IRON BINDING CAPACITY (CC-WL,HP ONLY)
Iron: 80 ug/dL (ref 45–182)
Saturation Ratios: 23 % (ref 17.9–39.5)
TIBC: 350 ug/dL (ref 250–450)
UIBC: 270 ug/dL (ref 117–376)

## 2022-01-05 LAB — FERRITIN: Ferritin: 423 ng/mL — ABNORMAL HIGH (ref 24–336)

## 2022-01-05 LAB — CMP (CANCER CENTER ONLY)
ALT: 19 U/L (ref 0–44)
AST: 34 U/L (ref 15–41)
Albumin: 3.8 g/dL (ref 3.5–5.0)
Alkaline Phosphatase: 102 U/L (ref 38–126)
Anion gap: 6 (ref 5–15)
BUN: 11 mg/dL (ref 6–20)
CO2: 28 mmol/L (ref 22–32)
Calcium: 9 mg/dL (ref 8.9–10.3)
Chloride: 104 mmol/L (ref 98–111)
Creatinine: 0.81 mg/dL (ref 0.61–1.24)
GFR, Estimated: >60 mL/min (ref >60)
Glucose, Bld: 145 mg/dL — ABNORMAL HIGH (ref 70–99)
Potassium: 4 mmol/L (ref 3.5–5.1)
Sodium: 138 mmol/L (ref 135–145)
Total Bilirubin: 1 mg/dL (ref 0.3–1.2)
Total Protein: 5.9 g/dL — ABNORMAL LOW (ref 6.5–8.1)

## 2022-01-05 LAB — CBC WITH DIFFERENTIAL (CANCER CENTER ONLY)
Abs Immature Granulocytes: 0 10*3/uL (ref 0.00–0.07)
Basophils Absolute: 0 10*3/uL (ref 0.0–0.1)
Basophils Relative: 1 %
Eosinophils Absolute: 0 10*3/uL (ref 0.0–0.5)
Eosinophils Relative: 2 %
HCT: 37.6 % — ABNORMAL LOW (ref 39.0–52.0)
Hemoglobin: 12 g/dL — ABNORMAL LOW (ref 13.0–17.0)
Immature Granulocytes: 0 %
Lymphocytes Relative: 13 %
Lymphs Abs: 0.2 10*3/uL — ABNORMAL LOW (ref 0.7–4.0)
MCH: 32.9 pg (ref 26.0–34.0)
MCHC: 31.9 g/dL (ref 30.0–36.0)
MCV: 103 fL — ABNORMAL HIGH (ref 80.0–100.0)
Monocytes Absolute: 0.4 10*3/uL (ref 0.1–1.0)
Monocytes Relative: 22 %
Neutro Abs: 1.1 10*3/uL — ABNORMAL LOW (ref 1.7–7.7)
Neutrophils Relative %: 62 %
Platelet Count: 71 10*3/uL — ABNORMAL LOW (ref 150–400)
RBC: 3.65 MIL/uL — ABNORMAL LOW (ref 4.22–5.81)
RDW: 18.4 % — ABNORMAL HIGH (ref 11.5–15.5)
WBC Count: 1.7 10*3/uL — ABNORMAL LOW (ref 4.0–10.5)
nRBC: 0 % (ref 0.0–0.2)

## 2022-01-05 LAB — LACTATE DEHYDROGENASE: LDH: 272 U/L — ABNORMAL HIGH (ref 98–192)

## 2022-01-05 MED ORDER — SODIUM CHLORIDE 0.9 % IV SOLN
1900.0000 mg/m2 | INTRAVENOUS | Status: DC
  Administered 2022-01-05: 4600 mg via INTRAVENOUS
  Filled 2022-01-05: qty 92

## 2022-01-05 MED ORDER — LEUCOVORIN CALCIUM INJECTION 350 MG
400.0000 mg/m2 | Freq: Once | INTRAVENOUS | Status: AC
  Administered 2022-01-05: 964 mg via INTRAVENOUS
  Filled 2022-01-05: qty 48.2

## 2022-01-05 MED ORDER — OXALIPLATIN CHEMO INJECTION 100 MG/20ML
67.0000 mg/m2 | Freq: Once | INTRAVENOUS | Status: AC
  Administered 2022-01-05: 160 mg via INTRAVENOUS
  Filled 2022-01-05: qty 32

## 2022-01-05 MED ORDER — PALONOSETRON HCL INJECTION 0.25 MG/5ML
0.2500 mg | Freq: Once | INTRAVENOUS | Status: AC
  Administered 2022-01-05: 0.25 mg via INTRAVENOUS
  Filled 2022-01-05: qty 5

## 2022-01-05 MED ORDER — DEXTROSE 5 % IV SOLN: Freq: Once | INTRAVENOUS | Status: AC

## 2022-01-05 MED ORDER — SODIUM CHLORIDE 0.9 % IV SOLN
10.0000 mg | Freq: Once | INTRAVENOUS | Status: AC
  Administered 2022-01-05: 10 mg via INTRAVENOUS
  Filled 2022-01-05: qty 10

## 2022-01-05 NOTE — Progress Notes (Signed)
OK to treat with ANC-1.1, WBC-1.7 and Plts-71 per order of Dr. Marin Olp. No Zometa today per order of Dr. Marin Olp. Pt requests requests to get Testosterone on Wednesday with pump d/c.

## 2022-01-05 NOTE — Patient Instructions (Signed)
[facility] [place]  Discharge Instructions: Thank you for choosing [facility] to provide your oncology and hematology care.   If you have a lab appointment with the [facility], please go directly to the [facility] and check in at the registration area.  Wear comfortable clothing and clothing appropriate for easy access to any Portacath or PICC line.   We strive to give you quality time with your provider. You may need to reschedule your appointment if you arrive late (15 or more minutes).  Arriving late affects you and other patients whose appointments are after yours.  Also, if you miss three or more appointments without notifying the office, you may be dismissed from the clinic at the provider's discretion.      For prescription refill requests, have your pharmacy contact our office and allow 72 hours for refills to be completed.    Today you received the following chemotherapy and/or immunotherapy agents:  Oxaliplatin, Leucovorin and 5-FU.       To help prevent nausea and vomiting after your treatment, we encourage you to take your nausea medication as directed.  BELOW ARE SYMPTOMS THAT SHOULD BE REPORTED IMMEDIATELY: *FEVER GREATER THAN 100.4 F (38 C) OR HIGHER *CHILLS OR SWEATING *NAUSEA AND VOMITING THAT IS NOT CONTROLLED WITH YOUR NAUSEA MEDICATION *UNUSUAL SHORTNESS OF BREATH *UNUSUAL BRUISING OR BLEEDING *URINARY PROBLEMS (pain or burning when urinating, or frequent urination) *BOWEL PROBLEMS (unusual diarrhea, constipation, pain near the anus) TENDERNESS IN MOUTH AND THROAT WITH OR WITHOUT PRESENCE OF ULCERS (sore throat, sores in mouth, or a toothache) UNUSUAL RASH, SWELLING OR PAIN  UNUSUAL VAGINAL DISCHARGE OR ITCHING   Items with * indicate a potential emergency and should be followed up as soon as possible or go to the Emergency Department if any problems should occur.  Please show the CHEMOTHERAPY ALERT CARD or IMMUNOTHERAPY  ALERT CARD at check-in to the Emergency Department and triage nurse. Should you have questions after your visit or need to cancel or reschedule your appointment, please contact [facility]  [phone] and follow the prompts.  Office hours are 8:00 a.m. to 4:30 p.m. Monday - Friday. Please note that voicemails left after 4:00 p.m. may not be returned until the following business day.  We are closed weekends and major holidays. You have access to a nurse at all times for urgent questions. Please call the main number to the clinic [phone] and follow the prompts.  For any non-urgent questions, you may also contact your provider using MyChart. We now offer e-Visits for anyone 52 and older to request care online for non-urgent symptoms. For details visit [email or web address].   Also download the MyChart app! Go to the app store, search "MyChart", open the app, select [facility], and log in with your MyChart username and password.  Masks are optional in the cancer centers. If you would like for your care team to wear a mask while they are taking care of you, please let them know. You may have one support person who is at least 46 years old accompany you for your appointments.

## 2022-01-05 NOTE — Progress Notes (Signed)
Hematology and Oncology Follow Up Visit  Brian [name] [id number] 04/04/1975 46 y.o. 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 + Bilateral pulmonary emboli without right heart strain  DVT left popliteal vein    Past Therapy: Cabometyx 40 mg po q day -- start on 04/17/2019 - d/c on 07/17/2019 for progression Lenvima 24 mg po q day -- on hold due to proteinuria Taxotere/CDDP -- s/p cycle 4 - started on 09/13/2019 Adriamycin weekly 2 weeks on 1 week off - s/p cycle 8 --d/c on 04/16/2020 due to  progression Gavreto 400 mg po q day -- started on 05/17/2020  -- d/c on 06/16/2020 Pembrolizumab 200 mg IV q 3 week -- s/p cycle #8 - start on 06/26/2020 -- d/c on 01/16/2021 due to progression Vinblastine q week x 4 weeks on / 1 week off -- s/p  cycle #2 -- start on 02/24/2021 -- d/c on 05/20/2021 XRT for LEFT shoulder met -completed on 06/22/2021   Current Therapy:     FOLFOX -- s/p cycle #4 -- start on 07/08/2021 Zometa 4 mg IV q 2 months - next dose on 02/2022 Eliquis 5 mg po BID Testosterone 400 mg IM q 2 weeks   Interim History:  [name] is here today for follow-up and treatment.  He is doing pretty well.  He had a good weekend.  Hopefully, he will have a nice Thanksgiving.  His blood counts are little bit on the lower side.  I think what we will do that we will hold off on the 5-FU bolus on him today.  I think this will help with his platelet count.  He has had decent pain control.  We do give him testosterone.  He needs testosterone every couple weeks.  This improves his quality of life.  He continues on Eliquis.  He will stay on Eliquis long-term.  He has had no cough.  He does get short of breath on occasion.  He has had no nausea or vomiting.  He has had no leg swelling.  He has had no rashes.  Overall, I would say his performance status is probably ECOG 1.   Medications:  Allergies as of 01/05/2022        Reactions   Dextromethorphan-guaifenesin Other (See Comments)   Irregular heartbeat   Doxycycline Anaphylaxis, Nausea And Vomiting, Rash, Other (See Comments)   "heart arrythmia" and "dyspepsia" (only oral doxycycline causes reaction)   Gadobutrol Hives, Other (See Comments)   Patient had MRI scan at [place] [facility]. Patient called one hour after he left imaging facility to report two "blisters" that came up on "back" of lip.    Gadolinium Derivatives Hives   Patient had MRI scan at [facility]. Patient called one hour after he left imaging facility to report two "blisters" that came up on "back" of lip.    Guaifenesin Palpitations      Ibuprofen Hives, Itching   Lisinopril Other (See Comments)   Angioedema   Pantoprazole Itching   Tramadol Hives, Itching   Nexavar [sorafenib] Hives   Barium Rash   Developed redness around neck after drinking 1st bottle of Barocat; pt was given Benedryl by ED Mds to be "on the safe side" before drinking 2nd bottle   Chlorhexidine Hives        Medication List        Accurate as of January 05, 2022  9:18 AM. If you have any questions,  ask your nurse or doctor.          azithromycin 250 MG tablet Commonly known as: Zithromax Z-Pak Take 2 tablets by mouth on the first day, and then take 1 tablet daily until you run out.   cyclobenzaprine 10 MG tablet Commonly known as: FLEXERIL Take 1 tablet (10 mg total) by mouth 3 (three) times daily as needed for muscle spasms.   diphenhydrAMINE 25 MG tablet Commonly known as: BENADRYL Take 2 tablets(50 mg total) by mouth one hour prior to CT Scan   Eliquis 5 MG Tabs tablet Generic drug: apixaban TAKE 1 TABLET (5 MG TOTAL) BY MOUTH 2 (TWO) TIMES DAILY.   EPINEPHrine 0.3 mg/0.3 mL Soaj injection Commonly known as: EpiPen 2-Pak USE AS DIRECTED FOR LIFE THREATENING ALLERGIC REACTIONS   famotidine 40 MG tablet Commonly known as: PEPCID Take 1 tablet (40 mg total) by mouth 2 (two) times  daily.   Fusion Plus Caps Take 1 capsule by mouth daily.   HYDROmorphone 8 MG tablet Commonly known as: DILAUDID Take 1 tablet (8 mg total) by mouth every 4 (four) hours as needed for severe pain.   levothyroxine 200 MCG tablet Commonly known as: SYNTHROID Take one tablet (200 mcg dose) by mouth daily. Total:  288 mcg/day.   LORazepam 1 MG tablet Commonly known as: ATIVAN Take 1 tablet (1 mg total) by mouth every 6 (six) hours as needed for anxiety (for nausea and vomiting).   methylphenidate 10 MG tablet Commonly known as: RITALIN Take 1 tablet (10 mg total) by mouth 2 (two) times daily.   metoCLOPramide 10 MG tablet Commonly known as: REGLAN Take 1 tablet (10 mg total) by mouth every 6 (six) hours as needed for nausea or vomiting.   metoprolol tartrate 50 MG tablet Commonly known as: Lopressor Take 1 tablet (50 mg total) by mouth 2 (two) times daily.   multivitamin capsule Take 1 capsule by mouth daily.   OLANZapine 10 MG tablet Commonly known as: ZYPREXA TAKE 1 TABLET (10 MG TOTAL) BY MOUTH AT BEDTIME.   PEG 3350 17 GM/SCOOP Powd Mix 17 grams (1 Capful) in 8 ounces of liquid and drink by mouth daily as needed.   potassium chloride SA 20 MEQ tablet Commonly known as: KLOR-CON M Take 1 tablet (20 mEq total) by mouth daily.   predniSONE 50 MG tablet Commonly known as: DELTASONE TAKE 1 TABLET BY MOUTH 13 HOURS BEFORE CT SCAN, 1 TABLET 7 HOURS BEFORE CT SCAN, AND 1 TABLET 1 HOUR BEFORE CT SCAN   prochlorperazine 10 MG tablet Commonly known as: COMPAZINE Take 1 tablet (10 mg total) by mouth every 6 (six) hours as needed (Nausea or vomiting).   promethazine 25 MG tablet Commonly known as: PHENERGAN Take 1 tablet (25 mg total) by mouth every 6 (six) hours as needed for nausea and vomiting.   senna-docusate 8.6-50 MG tablet Commonly known as: Senokot-S Take 1 tablet by mouth at bedtime as needed for mild constipation.   tamsulosin 0.4 MG Caps capsule Commonly  known as: Flomax Take 1 capsule (0.4 mg total) by mouth daily after supper.   temazepam 30 MG capsule Commonly known as: RESTORIL Take 1 capsule (30 mg total) by mouth at bedtime as needed for sleep.   Xtampza ER 36 MG C12a Generic drug: oxyCODONE ER Take 1 capsule (36 mg total) by mouth every 12 (twelve) hours        Allergies:  Allergies  Allergen Reactions   Dextromethorphan-Guaifenesin Other (See Comments)  Irregular heartbeat    Doxycycline Anaphylaxis, Nausea And Vomiting, Rash and Other (See Comments)    "heart arrythmia" and "dyspepsia" (only oral doxycycline causes reaction)   Gadobutrol Hives and Other (See Comments)    Patient had MRI scan at [facility]. Patient called one hour after he left imaging facility to report two "blisters" that came up on "back" of lip.     Gadolinium Derivatives Hives    Patient had MRI scan at [facility]. Patient called one hour after he left imaging facility to report two "blisters" that came up on "back" of lip.    Guaifenesin Palpitations        Ibuprofen Hives and Itching   Lisinopril Other (See Comments)    Angioedema   Pantoprazole Itching   Tramadol Hives and Itching   Nexavar [Sorafenib] Hives   Barium Rash    Developed redness around neck after drinking 1st bottle of Barocat; pt was given Benedryl by ED Mds to be "on the safe side" before drinking 2nd bottle   Chlorhexidine Hives    Past Medical History, Surgical history, Social history, and Family History were reviewed and updated.  Review of Systems: Review of Systems  Constitutional:  Positive for malaise/fatigue.  HENT: Negative.    Eyes: Negative.   Respiratory: Negative.    Cardiovascular: Negative.   Gastrointestinal:  Positive for abdominal pain.  Genitourinary: Negative.   Musculoskeletal:  Positive for myalgias.  Skin: Negative.   Neurological: Negative.   Endo/Heme/Allergies: Negative.   Psychiatric/Behavioral: Negative.        Physical Exam:  height is _0  (1.778 m) and weight is 275 lb (124.7 kg). His oral temperature is 98.3 F (36.8 C). His blood pressure is 122/81 and his pulse is 94. His respiration is 18 and oxygen saturation is 99%.   Wt Readings from Last 3 Encounters:  01/05/22 275 lb (124.7 kg)  12/22/21 270 lb (122.5 kg)  12/09/21 262 lb (118.8 kg)    Physical Exam Vitals reviewed.  HENT:     Head: Normocephalic and atraumatic.  Eyes:     Pupils: Pupils are equal, round, and reactive to light.  Cardiovascular:     Rate and Rhythm: Normal rate and regular rhythm.     Heart sounds: Normal heart sounds.  Pulmonary:     Effort: Pulmonary effort is normal.     Breath sounds: Normal breath sounds.  Abdominal:     General: Bowel sounds are normal.     Palpations: Abdomen is soft.  Musculoskeletal:        General: No tenderness or deformity. Normal range of motion.     Cervical back: Normal range of motion.  Lymphadenopathy:     Cervical: No cervical adenopathy.  Skin:    General: Skin is warm and dry.     Findings: No erythema or rash.  Neurological:     Mental Status: He is alert and oriented to person, place, and time.  Psychiatric:        Behavior: Behavior normal.        Thought Content: Thought content normal.        Judgment: Judgment normal.      Lab Results  Component Value Date   WBC 1.7 (L) 01/05/2022   HGB 12.0 (L) 01/05/2022   HCT 37.6 (L) 01/05/2022   MCV 103.0 (H) 01/05/2022   PLT 71 (L) 01/05/2022   Lab Results  Component Value Date   FERRITIN 447 (H) 12/22/2021   IRON 161  12/22/2021   TIBC 371 12/22/2021   UIBC 210 12/22/2021   IRONPCTSAT 43 (H) 12/22/2021   Lab Results  Component Value Date   RETICCTPCT 3.9 (H) 08/08/2020   RBC 3.65 (L) 01/05/2022   No results found for: "KPAFRELGTCHN", "LAMBDASER", "KAPLAMBRATIO" No results found for: "IGGSERUM", "IGA", "IGMSERUM" No results found for: "TOTALPROTELP", "ALBUMINELP", "A1GS", "A2GS", "BETS",  "BETA2SER", "GAMS", "MSPIKE", "SPEI"   Chemistry      Component Value Date/Time   NA 138 01/05/2022 0830   NA 141 02/06/2019 1210   K 4.0 01/05/2022 0830   CL 104 01/05/2022 0830   CO2 28 01/05/2022 0830   BUN 11 01/05/2022 0830   BUN 8 02/06/2019 1210   CREATININE 0.81 01/05/2022 0830      Component Value Date/Time   CALCIUM 9.0 01/05/2022 0830   ALKPHOS 102 01/05/2022 0830   AST 34 01/05/2022 0830   ALT 19 01/05/2022 0830   BILITOT 1.0 01/05/2022 0830       Impression and Plan: 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    Surprising, he has responded to the FOLFOX.  As such, we will continue him on the FOLFOX.  I am just happy that his quality of life is doing well.  Hopefully, we can improve his blood counts a little bit.  Again I am going to admit the bolus 5-FU.  We will go ahead with his fifth cycle of FOLFOX.  I will give him an extra week off.  After the sixth cycle, we will repeat his scans and see how everything looks.  Again I am just happy that he made it to Thanksgiving.  He really has done a fantastic job.  Again I am just glad that his quality of life is better.  I think the testosterone is making this better for him.   We will proceed with cycle 4 today as planned per Dr. Marin Olp including Zometa.   We will repeat scans after cycle 6.  Follow-up in 2 weeks.   [name], MD 11/20/20239:18 AM

## 2022-01-05 NOTE — Patient Instructions (Signed)

## 2022-01-06 ENCOUNTER — Ambulatory Visit: Payer: BC Managed Care – PPO

## 2022-01-06 LAB — TESTOSTERONE: Testosterone: 501 ng/dL (ref 264–916)

## 2022-01-07 ENCOUNTER — Inpatient Hospital Stay: Payer: BC Managed Care – PPO

## 2022-01-07 VITALS — BP 123/75 | HR 74 | Temp 98.3°F | Resp 18

## 2022-01-07 DIAGNOSIS — C73 Malignant neoplasm of thyroid gland: Secondary | ICD-10-CM

## 2022-01-07 DIAGNOSIS — E291 Testicular hypofunction: Secondary | ICD-10-CM

## 2022-01-07 DIAGNOSIS — D509 Iron deficiency anemia, unspecified: Secondary | ICD-10-CM

## 2022-01-07 MED ORDER — SODIUM CHLORIDE 0.9% FLUSH
10.0000 mL | INTRAVENOUS | Status: DC | PRN
  Administered 2022-01-07: 10 mL

## 2022-01-07 MED ORDER — HEPARIN SOD (PORK) LOCK FLUSH 100 UNIT/ML IV SOLN
500.0000 [IU] | Freq: Once | INTRAVENOUS | Status: AC | PRN
  Administered 2022-01-07: 500 [IU]

## 2022-01-07 MED ORDER — ZOLEDRONIC ACID 4 MG/100ML IV SOLN
4.0000 mg | Freq: Once | INTRAVENOUS | Status: AC
  Administered 2022-01-07: 4 mg via INTRAVENOUS
  Filled 2022-01-07: qty 100

## 2022-01-07 MED ORDER — SODIUM CHLORIDE 0.9 % IV SOLN: Freq: Once | INTRAVENOUS | Status: AC

## 2022-01-07 MED ORDER — TESTOSTERONE CYPIONATE 200 MG/ML IM SOLN
400.0000 mg | INTRAMUSCULAR | Status: DC
  Administered 2022-01-07: 400 mg via INTRAMUSCULAR
  Filled 2022-01-07: qty 2

## 2022-01-07 NOTE — Patient Instructions (Signed)
Zoledronic Acid Injection (Cancer) What is this medication? ZOLEDRONIC ACID (ZOE le dron ik AS id) treats high calcium levels in the blood caused by cancer. It may also be used with chemotherapy to treat weakened bones caused by cancer. It works by slowing down the release of calcium from bones. This lowers calcium levels in your blood. It also makes your bones stronger and less likely to break (fracture). It belongs to a group of medications called bisphosphonates. This medicine may be used for other purposes; ask your health care provider or pharmacist if you have questions. COMMON BRAND NAME(S): Zometa, Zometa Powder What should I tell my care team before I take this medication? They need to know if you have any of these conditions: Dehydration Dental disease Kidney disease Liver disease Low levels of calcium in the blood Lung or breathing disease, such as asthma Receiving steroids, such as dexamethasone or prednisone An unusual or allergic reaction to zoledronic acid, other medications, foods, dyes, or preservatives Pregnant or trying to get pregnant Breast-feeding How should I use this medication? This medication is injected into a vein. It is given by your care team in a hospital or clinic setting. Talk to your care team about the use of this medication in children. Special care may be needed. Overdosage: If you think you have taken too much of this medicine contact a poison control center or emergency room at once. NOTE: This medicine is only for you. Do not share this medicine with others. What if I miss a dose? Keep appointments for follow-up doses. It is important not to miss your dose. Call your care team if you are unable to keep an appointment. What may interact with this medication? Certain antibiotics given by injection Diuretics, such as bumetanide, furosemide NSAIDs, medications for pain and inflammation, such as ibuprofen or naproxen Teriparatide Thalidomide This list  may not describe all possible interactions. Give your health care provider a list of all the medicines, herbs, non-prescription drugs, or dietary supplements you use. Also tell them if you smoke, drink alcohol, or use illegal drugs. Some items may interact with your medicine. What should I watch for while using this medication? Visit your care team for regular checks on your progress. It may be some time before you see the benefit from this medication. Some people who take this medication have severe bone, joint, or muscle pain. This medication may also increase your risk for jaw problems or a broken thigh bone. Tell your care team right away if you have severe pain in your jaw, bones, joints, or muscles. Tell you care team if you have any pain that does not go away or that gets worse. Tell your dentist and dental surgeon that you are taking this medication. You should not have major dental surgery while on this medication. See your dentist to have a dental exam and fix any dental problems before starting this medication. Take good care of your teeth while on this medication. Make sure you see your dentist for regular follow-up appointments. You should make sure you get enough calcium and vitamin D while you are taking this medication. Discuss the foods you eat and the vitamins you take with your care team. Check with your care team if you have severe diarrhea, nausea, and vomiting, or if you sweat a lot. The loss of too much body fluid may make it dangerous for you to take this medication. You may need bloodwork while taking this medication. Talk to your care team if  you wish to become pregnant or think you might be pregnant. This medication can cause serious birth defects. What side effects may I notice from receiving this medication? Side effects that you should report to your care team as soon as possible: Allergic reactions--skin rash, itching, hives, swelling of the face, lips, tongue, or  throat Kidney injury--decrease in the amount of urine, swelling of the ankles, hands, or feet Low calcium level--muscle pain or cramps, confusion, tingling, or numbness in the hands or feet Osteonecrosis of the jaw--pain, swelling, or redness in the mouth, numbness of the jaw, poor healing after dental work, unusual discharge from the mouth, visible bones in the mouth Severe bone, joint, or muscle pain Side effects that usually do not require medical attention (report to your care team if they continue or are bothersome): Constipation Fatigue Fever Loss of appetite Nausea Stomach pain This list may not describe all possible side effects. Call your doctor for medical advice about side effects. You may report side effects to FDA at 1-800-FDA-1088. Where should I keep my medication? This medication is given in a hospital or clinic. It will not be stored at home. NOTE: This sheet is a summary. It may not cover all possible information. If you have questions about this medicine, talk to your doctor, pharmacist, or health care provider.  2023 Elsevier/Gold Standard (2021-03-20 00:00:00) Testosterone Injection What is this medication? TESTOSTERONE (tes TOS ter one) is used to increase testosterone levels in your body. It belongs to a group of medications called androgen hormones. This medicine may be used for other purposes; ask your health care provider or pharmacist if you have questions. COMMON BRAND NAME(S): Andro-L.A., Aveed, Delatestryl, Depo-Testosterone, Virilon What should I tell my care team before I take this medication? They need to know if you have any of these conditions: Cancer Diabetes Heart disease Kidney disease Liver disease Lung disease Prostate disease An unusual or allergic reaction to testosterone, other medications, foods, dyes, or preservatives If a male partner is pregnant or trying to get pregnant Breast-feeding How should I use this medication? This  medication is for injection into a muscle. It is usually given in a hospital or clinic. A special MedGuide will be given to you by the pharmacist with each prescription and refill. Be sure to read this information carefully each time. Contact your care team regarding the use of this medication in children. While this medication may be prescribed for children as young as 55 years of age for selected conditions, precautions do apply. Overdosage: If you think you have taken too much of this medicine contact a poison control center or emergency room at once. NOTE: This medicine is only for you. Do not share this medicine with others. What if I miss a dose? Try not to miss a dose. Your care team will tell you when your next injection is due. Notify the office if you are unable to keep an appointment. What may interact with this medication? Medications for diabetes Medications that treat or prevent blood clots like warfarin Oxyphenbutazone Propranolol Steroid medications like prednisone or cortisone This list may not describe all possible interactions. Give your health care provider a list of all the medicines, herbs, non-prescription drugs, or dietary supplements you use. Also tell them if you smoke, drink alcohol, or use illegal drugs. Some items may interact with your medicine. What should I watch for while using this medication? Visit your care team for regular checks on your progress. They will need to check the  level of testosterone in your blood. This medication is only approved for use in men who have low levels of testosterone related to certain medical conditions. Heart attacks and strokes have been reported with the use of this medication. Notify your care team and seek emergency treatment if you develop breathing problems; changes in vision; confusion; chest pain or chest tightness; sudden arm pain; severe, sudden headache; trouble speaking or understanding; sudden numbness or weakness of the  face, arm or leg; loss of balance or coordination. Talk to your care team about the risks and benefits of this medication. This medication may affect blood sugar levels. If you have diabetes, check with your care team before you change your diet or the dose of your diabetic medication. Testosterone injections are not commonly used in women. Women should inform their care team if they wish to become pregnant or think they might be pregnant. There is a potential for serious side effects to an unborn child. Talk to your care team or pharmacist for more information. Talk with your care team about your birth control options while taking this medication. This medication is banned from use in athletes by most athletic organizations. What side effects may I notice from receiving this medication? Side effects that you should report to your care team as soon as possible: Allergic reactions--skin rash, itching, hives, swelling of the face, lips, tongue, or throat Blood clot--pain, swelling, or warmth in the leg, shortness of breath, chest pain Heart attack--pain or tightness in the chest, shoulders, arms or jaw, nausea, shortness of breath, cold or clammy skin, feeling faint or lightheaded Increase in blood pressure Liver injury--right upper belly pain, loss of appetite, nausea, light-colored stool, dark yellow or brown urine, yellowing of the skin or eyes, unusual weakness or fatigue Mood swings, irritability, or hostility Prolonged or painful erection Sleep apnea--loud snoring, gasping during sleep, daytime sleepiness Stroke--sudden numbness or weakness of the face, arm or leg, trouble speaking, confusion, trouble walking, loss of balance or coordination, dizziness, severe headache, change in vision Swelling of the ankles, hands, or feet Thoughts of suicide or self-harm, worsening mood, feelings of depression Side effects that usually do not require medical attention (report to your care team if they  continue or are bothersome): Acne Change in sex drive or performance Pain, redness, or irritation at the application site Unexpected breast tissue growth This list may not describe all possible side effects. Call your doctor for medical advice about side effects. You may report side effects to FDA at 1-800-FDA-1088. Where should I keep my medication? Keep out of the reach of children. This medication can be abused. Keep your medication in a safe place to protect it from theft. Do not share this medication with anyone. Selling or giving away this medication is dangerous and against the law. Store at room temperature between 20 and 25 degrees C (68 and 77 degrees F). Do not freeze. Protect from light. Follow the directions for the product you are prescribed. Throw away any unused medication after the expiration date. NOTE: This sheet is a summary. It may not cover all possible information. If you have questions about this medicine, talk to your doctor, pharmacist, or health care provider.  2023 Elsevier/Gold Standard (2020-01-30 00:00:00)

## 2022-01-12 ENCOUNTER — Ambulatory Visit: Payer: BC Managed Care – PPO

## 2022-01-15 ENCOUNTER — Other Ambulatory Visit: Payer: Self-pay

## 2022-01-19 ENCOUNTER — Ambulatory Visit: Payer: BC Managed Care – PPO | Admitting: Family

## 2022-01-19 ENCOUNTER — Other Ambulatory Visit: Payer: BC Managed Care – PPO

## 2022-01-19 ENCOUNTER — Ambulatory Visit: Payer: BC Managed Care – PPO

## 2022-01-21 ENCOUNTER — Other Ambulatory Visit: Payer: Self-pay | Admitting: Hematology & Oncology

## 2022-01-21 ENCOUNTER — Other Ambulatory Visit (HOSPITAL_BASED_OUTPATIENT_CLINIC_OR_DEPARTMENT_OTHER): Payer: Self-pay

## 2022-01-21 DIAGNOSIS — G893 Neoplasm related pain (acute) (chronic): Secondary | ICD-10-CM

## 2022-01-21 DIAGNOSIS — C73 Malignant neoplasm of thyroid gland: Secondary | ICD-10-CM

## 2022-01-21 DIAGNOSIS — M546 Pain in thoracic spine: Secondary | ICD-10-CM

## 2022-01-21 DIAGNOSIS — R110 Nausea: Secondary | ICD-10-CM

## 2022-01-21 DIAGNOSIS — C787 Secondary malignant neoplasm of liver and intrahepatic bile duct: Secondary | ICD-10-CM

## 2022-01-21 MED ORDER — LORAZEPAM 1 MG PO TABS
1.0000 mg | ORAL_TABLET | Freq: Four times a day (QID) | ORAL | 0 refills | Status: DC | PRN
  Filled 2022-01-21: qty 30, 8d supply, fill #0

## 2022-01-21 MED ORDER — HYDROMORPHONE HCL 8 MG PO TABS
8.0000 mg | ORAL_TABLET | ORAL | 0 refills | Status: DC | PRN
  Filled 2022-01-21: qty 120, 20d supply, fill #0

## 2022-01-21 MED ORDER — XTAMPZA ER 36 MG PO C12A
36.0000 mg | EXTENDED_RELEASE_CAPSULE | Freq: Two times a day (BID) | ORAL | 0 refills | Status: DC
  Filled 2022-01-21: qty 60, 30d supply, fill #0

## 2022-01-21 MED ORDER — METHYLPHENIDATE HCL 10 MG PO TABS
10.0000 mg | ORAL_TABLET | Freq: Two times a day (BID) | ORAL | 0 refills | Status: DC
  Filled 2022-01-21: qty 60, 30d supply, fill #0

## 2022-01-21 MED ORDER — PROMETHAZINE HCL 25 MG PO TABS
25.0000 mg | ORAL_TABLET | Freq: Four times a day (QID) | ORAL | 0 refills | Status: DC | PRN
  Filled 2022-01-21: qty 120, 30d supply, fill #0

## 2022-01-21 MED ORDER — PROCHLORPERAZINE MALEATE 10 MG PO TABS
10.0000 mg | ORAL_TABLET | Freq: Four times a day (QID) | ORAL | 1 refills | Status: DC | PRN
  Filled 2022-01-21: qty 30, 8d supply, fill #0
  Filled 2022-02-04: qty 30, 8d supply, fill #1

## 2022-01-22 ENCOUNTER — Other Ambulatory Visit (HOSPITAL_BASED_OUTPATIENT_CLINIC_OR_DEPARTMENT_OTHER): Payer: Self-pay

## 2022-01-23 ENCOUNTER — Other Ambulatory Visit (HOSPITAL_BASED_OUTPATIENT_CLINIC_OR_DEPARTMENT_OTHER): Payer: Self-pay

## 2022-01-26 ENCOUNTER — Encounter: Payer: Self-pay | Admitting: Family

## 2022-01-26 ENCOUNTER — Other Ambulatory Visit: Payer: Self-pay | Admitting: Family

## 2022-01-26 ENCOUNTER — Inpatient Hospital Stay: Payer: BC Managed Care – PPO | Attending: Hematology & Oncology

## 2022-01-26 ENCOUNTER — Inpatient Hospital Stay: Payer: BC Managed Care – PPO

## 2022-01-26 ENCOUNTER — Other Ambulatory Visit (HOSPITAL_BASED_OUTPATIENT_CLINIC_OR_DEPARTMENT_OTHER): Payer: Self-pay

## 2022-01-26 ENCOUNTER — Inpatient Hospital Stay (HOSPITAL_BASED_OUTPATIENT_CLINIC_OR_DEPARTMENT_OTHER): Payer: BC Managed Care – PPO | Admitting: Family

## 2022-01-26 VITALS — BP 123/78 | HR 94 | Temp 98.5°F | Resp 20 | Ht 70.0 in | Wt 282.0 lb

## 2022-01-26 DIAGNOSIS — Z5111 Encounter for antineoplastic chemotherapy: Secondary | ICD-10-CM | POA: Insufficient documentation

## 2022-01-26 DIAGNOSIS — C73 Malignant neoplasm of thyroid gland: Secondary | ICD-10-CM | POA: Diagnosis present

## 2022-01-26 DIAGNOSIS — Z79899 Other long term (current) drug therapy: Secondary | ICD-10-CM | POA: Diagnosis not present

## 2022-01-26 DIAGNOSIS — E291 Testicular hypofunction: Secondary | ICD-10-CM | POA: Insufficient documentation

## 2022-01-26 DIAGNOSIS — C787 Secondary malignant neoplasm of liver and intrahepatic bile duct: Secondary | ICD-10-CM | POA: Diagnosis present

## 2022-01-26 DIAGNOSIS — E039 Hypothyroidism, unspecified: Secondary | ICD-10-CM | POA: Diagnosis not present

## 2022-01-26 LAB — CBC WITH DIFFERENTIAL (CANCER CENTER ONLY)
Abs Immature Granulocytes: 0.01 10*3/uL (ref 0.00–0.07)
Basophils Absolute: 0 10*3/uL (ref 0.0–0.1)
Basophils Relative: 1 %
Eosinophils Absolute: 0 10*3/uL (ref 0.0–0.5)
Eosinophils Relative: 1 %
HCT: 37 % — ABNORMAL LOW (ref 39.0–52.0)
Hemoglobin: 11.7 g/dL — ABNORMAL LOW (ref 13.0–17.0)
Immature Granulocytes: 0 %
Lymphocytes Relative: 9 %
Lymphs Abs: 0.3 10*3/uL — ABNORMAL LOW (ref 0.7–4.0)
MCH: 33 pg (ref 26.0–34.0)
MCHC: 31.6 g/dL (ref 30.0–36.0)
MCV: 104.2 fL — ABNORMAL HIGH (ref 80.0–100.0)
Monocytes Absolute: 1.1 10*3/uL — ABNORMAL HIGH (ref 0.1–1.0)
Monocytes Relative: 34 %
Neutro Abs: 1.7 10*3/uL (ref 1.7–7.7)
Neutrophils Relative %: 55 %
Platelet Count: 119 10*3/uL — ABNORMAL LOW (ref 150–400)
RBC: 3.55 MIL/uL — ABNORMAL LOW (ref 4.22–5.81)
RDW: 17.7 % — ABNORMAL HIGH (ref 11.5–15.5)
WBC Count: 3.1 10*3/uL — ABNORMAL LOW (ref 4.0–10.5)
nRBC: 0 % (ref 0.0–0.2)

## 2022-01-26 LAB — CMP (CANCER CENTER ONLY)
ALT: 22 U/L (ref 0–44)
AST: 50 U/L — ABNORMAL HIGH (ref 15–41)
Albumin: 3.8 g/dL (ref 3.5–5.0)
Alkaline Phosphatase: 155 U/L — ABNORMAL HIGH (ref 38–126)
Anion gap: 6 (ref 5–15)
BUN: 15 mg/dL (ref 6–20)
CO2: 28 mmol/L (ref 22–32)
Calcium: 9.5 mg/dL (ref 8.9–10.3)
Chloride: 101 mmol/L (ref 98–111)
Creatinine: 0.95 mg/dL (ref 0.61–1.24)
GFR, Estimated: >60 mL/min (ref >60)
Glucose, Bld: 139 mg/dL — ABNORMAL HIGH (ref 70–99)
Potassium: 3.8 mmol/L (ref 3.5–5.1)
Sodium: 135 mmol/L (ref 135–145)
Total Bilirubin: 1.4 mg/dL — ABNORMAL HIGH (ref 0.3–1.2)
Total Protein: 6.3 g/dL — ABNORMAL LOW (ref 6.5–8.1)

## 2022-01-26 LAB — TSH: TSH: 34.657 u[IU]/mL — ABNORMAL HIGH (ref 0.350–4.500)

## 2022-01-26 LAB — LACTATE DEHYDROGENASE: LDH: 618 U/L — ABNORMAL HIGH (ref 98–192)

## 2022-01-26 MED ORDER — PALONOSETRON HCL INJECTION 0.25 MG/5ML
0.2500 mg | Freq: Once | INTRAVENOUS | Status: AC
  Administered 2022-01-26: 0.25 mg via INTRAVENOUS
  Filled 2022-01-26: qty 5

## 2022-01-26 MED ORDER — OXALIPLATIN CHEMO INJECTION 100 MG/20ML
67.0000 mg/m2 | Freq: Once | INTRAVENOUS | Status: AC
  Administered 2022-01-26: 160 mg via INTRAVENOUS
  Filled 2022-01-26: qty 32

## 2022-01-26 MED ORDER — LEUCOVORIN CALCIUM INJECTION 350 MG
400.0000 mg/m2 | Freq: Once | INTRAVENOUS | Status: AC
  Administered 2022-01-26: 964 mg via INTRAVENOUS
  Filled 2022-01-26: qty 48.2

## 2022-01-26 MED ORDER — HEPARIN SOD (PORK) LOCK FLUSH 100 UNIT/ML IV SOLN: 500.0000 [IU] | Freq: Once | INTRAVENOUS | Status: DC | PRN

## 2022-01-26 MED ORDER — SODIUM CHLORIDE 0.9 % IV SOLN
1900.0000 mg/m2 | INTRAVENOUS | Status: DC
  Administered 2022-01-26: 4600 mg via INTRAVENOUS
  Filled 2022-01-26: qty 92

## 2022-01-26 MED ORDER — DEXTROSE 5 % IV SOLN: Freq: Once | INTRAVENOUS | Status: AC

## 2022-01-26 MED ORDER — SODIUM CHLORIDE 0.9% FLUSH: 10.0000 mL | INTRAVENOUS | Status: DC | PRN

## 2022-01-26 MED ORDER — SODIUM CHLORIDE 0.9 % IV SOLN
10.0000 mg | Freq: Once | INTRAVENOUS | Status: AC
  Administered 2022-01-26: 10 mg via INTRAVENOUS
  Filled 2022-01-26: qty 10

## 2022-01-26 MED ORDER — DEXTROSE 5 % IV SOLN: Freq: Once | INTRAVENOUS | Status: DC

## 2022-01-26 NOTE — Progress Notes (Signed)
Labs reviewed with Lottie Dawson NP.  Ok to treat today.

## 2022-01-26 NOTE — Progress Notes (Addendum)
Hematology and Oncology Follow Up Visit  Brian [name] [id number] July 21, 1975 46 y.o. 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 + Bilateral pulmonary emboli without right heart strain  DVT left popliteal vein    Past Therapy: Cabometyx 40 mg po q day -- start on 04/17/2019 - d/c on 07/17/2019 for progression Lenvima 24 mg po q day -- on hold due to proteinuria Taxotere/CDDP -- s/p cycle 4 - started on 09/13/2019 Adriamycin weekly 2 weeks on 1 week off - s/p cycle 8 --d/c on 04/16/2020 due to  progression Gavreto 400 mg po q day -- started on 05/17/2020  -- d/c on 06/16/2020 Pembrolizumab 200 mg IV q 3 week -- s/p cycle #8 - start on 06/26/2020 -- d/c on 01/16/2021 due to progression Vinblastine q week x 4 weeks on / 1 week off -- s/p  cycle #2 -- start on 02/24/2021 -- d/c on 05/20/2021 XRT for LEFT shoulder met -completed on 06/22/2021   Current Therapy:     FOLFOX -- s/p cycle 5 -- start on 07/08/2021 Zometa 4 mg IV q 2 months - next dose on 02/2022 Eliquis 5 mg po BID Testosterone 400 mg IM q 2 weeks   Interim History:  Brian [name] is here today for follow-up and treatment. He is a little sleepy this morning. He states that he took his Restoril late in the night because he couldn't sleep and is still groggy. He denies taking any pain medication this morning.  His father drove him here and will be taking him home after.  He is able to answer questions and is oriented to person, place and time.  No fever, chills, n/v, cough, rash, dizziness, SOB, chest pain, palpitations, abdominal pain or changes in bowel or bladder habits.  No blood loss noted. No bruising or petechiae  No swelling in his extremities at this time.  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 This eventually resolves on its own.  No falls or syncope reported.  Appetite and hydration are good. Weight is  stable at 282 lbs.   ECOG Performance Status: 1 - Symptomatic but completely ambulatory  Medications:  Allergies as of 01/26/2022       Reactions   Dextromethorphan-guaifenesin Other (See Comments)   Irregular heartbeat   Doxycycline Anaphylaxis, Nausea And Vomiting, Rash, Other (See Comments)   "heart arrythmia" and "dyspepsia" (only oral doxycycline causes reaction)   Gadobutrol Hives, Other (See Comments)   Patient had MRI scan at [place]. Patient called one hour after he left imaging facility to report two "blisters" that came up on "back" of lip.    Gadolinium Derivatives Hives   Patient had MRI scan at [facility]. Patient called one hour after he left imaging facility to report two "blisters" that came up on "back" of lip.    Guaifenesin Palpitations      Ibuprofen Hives, Itching   Lisinopril Other (See Comments)   Angioedema   Pantoprazole Itching   Tramadol Hives, Itching   Nexavar [sorafenib] Hives   Barium Rash   Developed redness around neck after drinking 1st bottle of Barocat; pt was given Benedryl by ED Mds to be "on the safe side" before drinking 2nd bottle   Chlorhexidine Hives        Medication List        Accurate as of January 26, 2022  9:15  AM. If you have any questions, ask your nurse or doctor.          azithromycin 250 MG tablet Commonly known as: Zithromax Z-Pak Take 2 tablets by mouth on the first day, and then take 1 tablet daily until you run out.   cyclobenzaprine 10 MG tablet Commonly known as: FLEXERIL Take 1 tablet (10 mg total) by mouth 3 (three) times daily as needed for muscle spasms.   diphenhydrAMINE 25 MG tablet Commonly known as: BENADRYL Take 2 tablets(50 mg total) by mouth one hour prior to CT Scan   Eliquis 5 MG Tabs tablet Generic drug: apixaban TAKE 1 TABLET (5 MG TOTAL) BY MOUTH 2 (TWO) TIMES DAILY.   EPINEPHrine 0.3 mg/0.3 mL Soaj injection Commonly known as: EpiPen 2-Pak USE AS DIRECTED FOR  LIFE THREATENING ALLERGIC REACTIONS   famotidine 40 MG tablet Commonly known as: PEPCID Take 1 tablet (40 mg total) by mouth 2 (two) times daily.   Fusion Plus Caps Take 1 capsule by mouth daily.   HYDROmorphone 8 MG tablet Commonly known as: DILAUDID Take 1 tablet (8 mg total) by mouth every 4 (four) hours as needed for severe pain.   levothyroxine 200 MCG tablet Commonly known as: SYNTHROID Take one tablet (200 mcg dose) by mouth daily. Total:  288 mcg/day.   LORazepam 1 MG tablet Commonly known as: ATIVAN Take 1 tablet (1 mg total) by mouth every 6 (six) hours as needed for anxiety (for nausea and vomiting).   methylphenidate 10 MG tablet Commonly known as: RITALIN Take 1 tablet (10 mg total) by mouth 2 (two) times daily.   metoCLOPramide 10 MG tablet Commonly known as: REGLAN Take 1 tablet (10 mg total) by mouth every 6 (six) hours as needed for nausea or vomiting.   metoprolol tartrate 50 MG tablet Commonly known as: Lopressor Take 1 tablet (50 mg total) by mouth 2 (two) times daily.   multivitamin capsule Take 1 capsule by mouth daily.   OLANZapine 10 MG tablet Commonly known as: ZYPREXA TAKE 1 TABLET (10 MG TOTAL) BY MOUTH AT BEDTIME.   PEG 3350 17 GM/SCOOP Powd Mix 17 grams (1 Capful) in 8 ounces of liquid and drink by mouth daily as needed.   potassium chloride SA 20 MEQ tablet Commonly known as: KLOR-CON M Take 1 tablet (20 mEq total) by mouth daily.   predniSONE 50 MG tablet Commonly known as: DELTASONE TAKE 1 TABLET BY MOUTH 13 HOURS BEFORE CT SCAN, 1 TABLET 7 HOURS BEFORE CT SCAN, AND 1 TABLET 1 HOUR BEFORE CT SCAN   prochlorperazine 10 MG tablet Commonly known as: COMPAZINE Take 1 tablet (10 mg total) by mouth every 6 (six) hours as needed (Nausea or vomiting).   promethazine 25 MG tablet Commonly known as: PHENERGAN Take 1 tablet (25 mg total) by mouth every 6 (six) hours as needed for nausea and vomiting.   senna-docusate 8.6-50 MG  tablet Commonly known as: Senokot-S Take 1 tablet by mouth at bedtime as needed for mild constipation.   tamsulosin 0.4 MG Caps capsule Commonly known as: Flomax Take 1 capsule (0.4 mg total) by mouth daily after supper.   temazepam 30 MG capsule Commonly known as: RESTORIL Take 1 capsule (30 mg total) by mouth at bedtime as needed for sleep.   Xtampza ER 36 MG C12a Generic drug: oxyCODONE ER Take 1 capsule (36 mg total) by mouth every 12 (twelve) hours        Allergies:  Allergies  Allergen Reactions  Dextromethorphan-Guaifenesin Other (See Comments)    Irregular heartbeat    Doxycycline Anaphylaxis, Nausea And Vomiting, Rash and Other (See Comments)    "heart arrythmia" and "dyspepsia" (only oral doxycycline causes reaction)   Gadobutrol Hives and Other (See Comments)    Patient had MRI scan at [place]. Patient called one hour after he left imaging facility to report two "blisters" that came up on "back" of lip.     Gadolinium Derivatives Hives    Patient had MRI scan at [place]. Patient called one hour after he left imaging facility to report two "blisters" that came up on "back" of lip.    Guaifenesin Palpitations        Ibuprofen Hives and Itching   Lisinopril Other (See Comments)    Angioedema   Pantoprazole Itching   Tramadol Hives and Itching   Nexavar [Sorafenib] Hives   Barium Rash    Developed redness around neck after drinking 1st bottle of Barocat; pt was given Benedryl by ED Mds to be "on the safe side" before drinking 2nd bottle   Chlorhexidine Hives    Past Medical History, Surgical history, Social history, and Family History were reviewed and updated.  Review of Systems: All other 10 point review of systems is negative.   Physical Exam:  vitals were not taken for this visit.   Wt Readings from Last 3 Encounters:  01/05/22 275 lb (124.7 kg)  12/22/21 270 lb (122.5 kg)  12/09/21 262 lb (118.8 kg)    Ocular: Sclerae  unicteric, pupils equal, round and reactive to light Ear-nose-throat: Oropharynx clear, dentition fair Lymphatic: No cervical or supraclavicular adenopathy Lungs no rales or rhonchi, good excursion bilaterally Heart regular rate and rhythm, no murmur appreciated Abd soft, nontender, positive bowel sounds MSK no focal spinal tenderness, no joint edema Neuro: non-focal, well-oriented, appropriate affect Breasts: Deferred   Lab Results  Component Value Date   WBC 1.7 (L) 01/05/2022   HGB 12.0 (L) 01/05/2022   HCT 37.6 (L) 01/05/2022   MCV 103.0 (H) 01/05/2022   PLT 71 (L) 01/05/2022   Lab Results  Component Value Date   FERRITIN 423 (H) 01/05/2022   IRON 80 01/05/2022   TIBC 350 01/05/2022   UIBC 270 01/05/2022   IRONPCTSAT 23 01/05/2022   Lab Results  Component Value Date   RETICCTPCT 3.9 (H) 08/08/2020   RBC 3.65 (L) 01/05/2022   No results found for: "KPAFRELGTCHN", "LAMBDASER", "KAPLAMBRATIO" No results found for: "IGGSERUM", "IGA", "IGMSERUM" No results found for: "TOTALPROTELP", "ALBUMINELP", "A1GS", "A2GS", "BETS", "BETA2SER", "GAMS", "MSPIKE", "SPEI"   Chemistry      Component Value Date/Time   NA 138 01/05/2022 0830   NA 141 02/06/2019 1210   K 4.0 01/05/2022 0830   CL 104 01/05/2022 0830   CO2 28 01/05/2022 0830   BUN 11 01/05/2022 0830   BUN 8 02/06/2019 1210   CREATININE 0.81 01/05/2022 0830      Component Value Date/Time   CALCIUM 9.0 01/05/2022 0830   ALKPHOS 102 01/05/2022 0830   AST 34 01/05/2022 0830   ALT 19 01/05/2022 0830   BILITOT 1.0 01/05/2022 0830       Impression and Plan: Brian [name] is a very pleasant 46 yo caucasian gentleman with an 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    He has tolerated FOLOFOX nicely so far.  I went over CBC, CMP and health assessment today with Dr. Marin Olp.  We will proceed with treatment today as planned per  MD.  Today is cycle 6 so we will get him scheduled for scans prior to his  next follow-up in 3 weeks. Dr. Marin Olp would prefer he have both a PET scan and MRI of liver.   Lottie Dawson, NP 12/11/20239:15 AM

## 2022-01-26 NOTE — Patient Instructions (Signed)
[facility]  Discharge Instructions: Thank you for choosing [facility] to provide your oncology and hematology care.   If you have a lab appointment with the [facility], please go directly to the [facility] and check in at the registration area.  Wear comfortable clothing and clothing appropriate for easy access to any Portacath or PICC line.   We strive to give you quality time with your provider. You may need to reschedule your appointment if you arrive late (15 or more minutes).  Arriving late affects you and other patients whose appointments are after yours.  Also, if you miss three or more appointments without notifying the office, you may be dismissed from the clinic at the provider's discretion.      For prescription refill requests, have your pharmacy contact our office and allow 72 hours for refills to be completed.    Today you received the following chemotherapy and/or immunotherapy agents 5FU, Leucovorin, Oxaliplatin,       To help prevent nausea and vomiting after your treatment, we encourage you to take your nausea medication as directed.  BELOW ARE SYMPTOMS THAT SHOULD BE REPORTED IMMEDIATELY: *FEVER GREATER THAN 100.4 F (38 C) OR HIGHER *CHILLS OR SWEATING *NAUSEA AND VOMITING THAT IS NOT CONTROLLED WITH YOUR NAUSEA MEDICATION *UNUSUAL SHORTNESS OF BREATH *UNUSUAL BRUISING OR BLEEDING *URINARY PROBLEMS (pain or burning when urinating, or frequent urination) *BOWEL PROBLEMS (unusual diarrhea, constipation, pain near the anus) TENDERNESS IN MOUTH AND THROAT WITH OR WITHOUT PRESENCE OF ULCERS (sore throat, sores in mouth, or a toothache) UNUSUAL RASH, SWELLING OR PAIN  UNUSUAL VAGINAL DISCHARGE OR ITCHING   Items with * indicate a potential emergency and should be followed up as soon as possible or go to the Emergency Department if any problems should occur.  Please show the CHEMOTHERAPY ALERT CARD or IMMUNOTHERAPY ALERT CARD  at check-in to the Emergency Department and triage nurse. Should you have questions after your visit or need to cancel or reschedule your appointment, please contact [facility]  [phone] and follow the prompts.  Office hours are 8:00 a.m. to 4:30 p.m. Monday - Friday. Please note that voicemails left after 4:00 p.m. may not be returned until the following business day.  We are closed weekends and major holidays. You have access to a nurse at all times for urgent questions. Please call the main number to the clinic [phone] and follow the prompts.  For any non-urgent questions, you may also contact your provider using MyChart. We now offer e-Visits for anyone 14 and older to request care online for non-urgent symptoms. For details visit [email or web address].   Also download the MyChart app! Go to the app store, search "MyChart", open the app, select [facility], and log in with your MyChart username and password.  Masks are optional in the cancer centers. If you would like for your care team to wear a mask while they are taking care of you, please let them know. You may have one support person who is at least 46 years old accompany you for your appointments.

## 2022-01-27 ENCOUNTER — Encounter: Payer: Self-pay | Admitting: Hematology & Oncology

## 2022-01-27 ENCOUNTER — Encounter: Payer: Self-pay | Admitting: Family

## 2022-01-27 ENCOUNTER — Other Ambulatory Visit: Payer: Self-pay

## 2022-01-27 ENCOUNTER — Other Ambulatory Visit (HOSPITAL_BASED_OUTPATIENT_CLINIC_OR_DEPARTMENT_OTHER): Payer: Self-pay

## 2022-01-27 ENCOUNTER — Telehealth: Payer: Self-pay

## 2022-01-27 DIAGNOSIS — C73 Malignant neoplasm of thyroid gland: Secondary | ICD-10-CM

## 2022-01-27 LAB — TESTOSTERONE: Testosterone: 341 ng/dL (ref 264–916)

## 2022-01-27 MED ORDER — PREDNISONE 50 MG PO TABS
ORAL_TABLET | ORAL | 1 refills | Status: DC
  Filled 2022-01-27: qty 3, 1d supply, fill #0

## 2022-01-27 MED ORDER — DIPHENHYDRAMINE HCL 25 MG PO CAPS
50.0000 mg | ORAL_CAPSULE | Freq: Once | ORAL | 0 refills | Status: DC
  Filled 2022-01-27: qty 100, 50d supply, fill #0

## 2022-01-27 NOTE — Telephone Encounter (Signed)
Pt requesting refill on meds taken prior to CT scan (Benadryl and Prednisone). [facility] sent.

## 2022-01-28 ENCOUNTER — Other Ambulatory Visit: Payer: Self-pay | Admitting: Family

## 2022-01-28 ENCOUNTER — Other Ambulatory Visit (HOSPITAL_BASED_OUTPATIENT_CLINIC_OR_DEPARTMENT_OTHER): Payer: Self-pay

## 2022-01-28 ENCOUNTER — Inpatient Hospital Stay: Payer: BC Managed Care – PPO

## 2022-01-28 VITALS — BP 107/81 | HR 85 | Temp 98.3°F | Resp 19

## 2022-01-28 DIAGNOSIS — C73 Malignant neoplasm of thyroid gland: Secondary | ICD-10-CM

## 2022-01-28 DIAGNOSIS — E291 Testicular hypofunction: Secondary | ICD-10-CM

## 2022-01-28 DIAGNOSIS — D509 Iron deficiency anemia, unspecified: Secondary | ICD-10-CM

## 2022-01-28 DIAGNOSIS — C787 Secondary malignant neoplasm of liver and intrahepatic bile duct: Secondary | ICD-10-CM | POA: Diagnosis not present

## 2022-01-28 MED ORDER — SODIUM CHLORIDE 0.9% FLUSH
10.0000 mL | INTRAVENOUS | Status: DC | PRN
  Administered 2022-01-28: 10 mL

## 2022-01-28 MED ORDER — HEPARIN SOD (PORK) LOCK FLUSH 100 UNIT/ML IV SOLN
500.0000 [IU] | Freq: Once | INTRAVENOUS | Status: AC | PRN
  Administered 2022-01-28: 500 [IU]

## 2022-01-28 MED ORDER — TESTOSTERONE CYPIONATE 200 MG/ML IM SOLN
400.0000 mg | Freq: Once | INTRAMUSCULAR | Status: AC
  Administered 2022-01-28: 400 mg via INTRAMUSCULAR
  Filled 2022-01-28: qty 2

## 2022-01-28 NOTE — Patient Instructions (Signed)
Testosterone Injection What is this medication? TESTOSTERONE (tes TOS ter one) is used to increase testosterone levels in your body. It belongs to a group of medications called androgen hormones. This medicine may be used for other purposes; ask your health care provider or pharmacist if you have questions. COMMON BRAND NAME(S): Andro-L.A., Aveed, Delatestryl, Depo-Testosterone, Virilon What should I tell my care team before I take this medication? They need to know if you have any of these conditions: Cancer Diabetes Heart disease Kidney disease Liver disease Lung disease Prostate disease An unusual or allergic reaction to testosterone, other medications, foods, dyes, or preservatives If a male partner is pregnant or trying to get pregnant Breast-feeding How should I use this medication? This medication is for injection into a muscle. It is usually given in a hospital or clinic. A special MedGuide will be given to you by the pharmacist with each prescription and refill. Be sure to read this information carefully each time. Contact your care team regarding the use of this medication in children. While this medication may be prescribed for children as young as 49 years of age for selected conditions, precautions do apply. Overdosage: If you think you have taken too much of this medicine contact a poison control center or emergency room at once. NOTE: This medicine is only for you. Do not share this medicine with others. What if I miss a dose? Try not to miss a dose. Your care team will tell you when your next injection is due. Notify the office if you are unable to keep an appointment. What may interact with this medication? Medications for diabetes Medications that treat or prevent blood clots like warfarin Oxyphenbutazone Propranolol Steroid medications like prednisone or cortisone This list may not describe all possible interactions. Give your health care provider a list of all the  medicines, herbs, non-prescription drugs, or dietary supplements you use. Also tell them if you smoke, drink alcohol, or use illegal drugs. Some items may interact with your medicine. What should I watch for while using this medication? Visit your care team for regular checks on your progress. They will need to check the level of testosterone in your blood. This medication is only approved for use in men who have low levels of testosterone related to certain medical conditions. Heart attacks and strokes have been reported with the use of this medication. Notify your care team and seek emergency treatment if you develop breathing problems; changes in vision; confusion; chest pain or chest tightness; sudden arm pain; severe, sudden headache; trouble speaking or understanding; sudden numbness or weakness of the face, arm or leg; loss of balance or coordination. Talk to your care team about the risks and benefits of this medication. This medication may affect blood sugar levels. If you have diabetes, check with your care team before you change your diet or the dose of your diabetic medication. Testosterone injections are not commonly used in women. Women should inform their care team if they wish to become pregnant or think they might be pregnant. There is a potential for serious side effects to an unborn child. Talk to your care team or pharmacist for more information. Talk with your care team about your birth control options while taking this medication. This medication is banned from use in athletes by most athletic organizations. What side effects may I notice from receiving this medication? Side effects that you should report to your care team as soon as possible: Allergic reactions--skin rash, itching, hives, swelling of the  face, lips, tongue, or throat Blood clot--pain, swelling, or warmth in the leg, shortness of breath, chest pain Heart attack--pain or tightness in the chest, shoulders, arms or jaw,  nausea, shortness of breath, cold or clammy skin, feeling faint or lightheaded Increase in blood pressure Liver injury--right upper belly pain, loss of appetite, nausea, light-colored stool, dark yellow or brown urine, yellowing of the skin or eyes, unusual weakness or fatigue Mood swings, irritability, or hostility Prolonged or painful erection Sleep apnea--loud snoring, gasping during sleep, daytime sleepiness Stroke--sudden numbness or weakness of the face, arm or leg, trouble speaking, confusion, trouble walking, loss of balance or coordination, dizziness, severe headache, change in vision Swelling of the ankles, hands, or feet Thoughts of suicide or self-harm, worsening mood, feelings of depression Side effects that usually do not require medical attention (report to your care team if they continue or are bothersome): Acne Change in sex drive or performance Pain, redness, or irritation at the application site Unexpected breast tissue growth This list may not describe all possible side effects. Call your doctor for medical advice about side effects. You may report side effects to FDA at 1-800-FDA-1088. Where should I keep my medication? Keep out of the reach of children. This medication can be abused. Keep your medication in a safe place to protect it from theft. Do not share this medication with anyone. Selling or giving away this medication is dangerous and against the law. Store at room temperature between 20 and 25 degrees C (68 and 77 degrees F). Do not freeze. Protect from light. Follow the directions for the product you are prescribed. Throw away any unused medication after the expiration date. NOTE: This sheet is a summary. It may not cover all possible information. If you have questions about this medicine, talk to your doctor, pharmacist, or health care provider.  2023 Elsevier/Gold Standard (2020-01-30 00:00:00)

## 2022-01-29 ENCOUNTER — Encounter: Payer: Self-pay | Admitting: Family

## 2022-01-29 ENCOUNTER — Encounter: Payer: Self-pay | Admitting: Hematology & Oncology

## 2022-01-31 ENCOUNTER — Encounter: Payer: Self-pay | Admitting: Hematology & Oncology

## 2022-01-31 ENCOUNTER — Encounter: Payer: Self-pay | Admitting: Family

## 2022-02-02 ENCOUNTER — Ambulatory Visit (HOSPITAL_COMMUNITY)
Admission: RE | Admit: 2022-02-02 | Discharge: 2022-02-02 | Disposition: A | Discharge status: Home / Self Care | Payer: BC Managed Care – PPO | Source: Ambulatory Visit | Attending: Family | Admitting: Family

## 2022-02-02 DIAGNOSIS — C73 Malignant neoplasm of thyroid gland: Secondary | ICD-10-CM | POA: Insufficient documentation

## 2022-02-02 MED ORDER — GADOBUTROL 1 MMOL/ML IV SOLN
10.0000 mL | Freq: Once | INTRAVENOUS | Status: AC | PRN
  Administered 2022-02-02: 10 mL via INTRAVENOUS

## 2022-02-04 ENCOUNTER — Other Ambulatory Visit (HOSPITAL_BASED_OUTPATIENT_CLINIC_OR_DEPARTMENT_OTHER): Payer: Self-pay

## 2022-02-10 ENCOUNTER — Other Ambulatory Visit: Payer: BC Managed Care – PPO

## 2022-02-10 ENCOUNTER — Ambulatory Visit: Payer: BC Managed Care – PPO | Admitting: Hematology & Oncology

## 2022-02-10 ENCOUNTER — Ambulatory Visit: Payer: BC Managed Care – PPO

## 2022-02-11 ENCOUNTER — Encounter (HOSPITAL_COMMUNITY)
Admission: RE | Admit: 2022-02-11 | Discharge: 2022-02-11 | Disposition: A | Discharge status: Home / Self Care | Payer: BC Managed Care – PPO | Source: Ambulatory Visit | Attending: Family | Admitting: Family

## 2022-02-11 DIAGNOSIS — C73 Malignant neoplasm of thyroid gland: Secondary | ICD-10-CM | POA: Insufficient documentation

## 2022-02-11 LAB — GLUCOSE, CAPILLARY: Glucose-Capillary: 94 mg/dL (ref 70–99)

## 2022-02-11 MED ORDER — FLUDEOXYGLUCOSE F - 18 (FDG) INJECTION
14.1000 | Freq: Once | INTRAVENOUS | Status: AC
  Administered 2022-02-11: 15.01 via INTRAVENOUS

## 2022-02-13 ENCOUNTER — Other Ambulatory Visit: Payer: Self-pay | Admitting: Hematology & Oncology

## 2022-02-13 DIAGNOSIS — C73 Malignant neoplasm of thyroid gland: Secondary | ICD-10-CM

## 2022-02-17 ENCOUNTER — Inpatient Hospital Stay: Payer: BC Managed Care – PPO

## 2022-02-17 ENCOUNTER — Encounter: Payer: Self-pay | Admitting: Hematology & Oncology

## 2022-02-17 ENCOUNTER — Other Ambulatory Visit (HOSPITAL_BASED_OUTPATIENT_CLINIC_OR_DEPARTMENT_OTHER): Payer: Self-pay

## 2022-02-17 ENCOUNTER — Inpatient Hospital Stay: Payer: BC Managed Care – PPO | Attending: Hematology & Oncology

## 2022-02-17 ENCOUNTER — Inpatient Hospital Stay: Payer: BC Managed Care – PPO | Admitting: Hematology & Oncology

## 2022-02-17 ENCOUNTER — Other Ambulatory Visit: Payer: Self-pay

## 2022-02-17 VITALS — BP 121/73 | HR 98 | Temp 98.1°F | Resp 18 | Ht 70.0 in | Wt 274.0 lb

## 2022-02-17 DIAGNOSIS — D509 Iron deficiency anemia, unspecified: Secondary | ICD-10-CM | POA: Insufficient documentation

## 2022-02-17 DIAGNOSIS — C799 Secondary malignant neoplasm of unspecified site: Secondary | ICD-10-CM

## 2022-02-17 DIAGNOSIS — E291 Testicular hypofunction: Secondary | ICD-10-CM | POA: Diagnosis not present

## 2022-02-17 DIAGNOSIS — C7951 Secondary malignant neoplasm of bone: Secondary | ICD-10-CM | POA: Diagnosis not present

## 2022-02-17 DIAGNOSIS — C787 Secondary malignant neoplasm of liver and intrahepatic bile duct: Secondary | ICD-10-CM

## 2022-02-17 DIAGNOSIS — Z79899 Other long term (current) drug therapy: Secondary | ICD-10-CM | POA: Diagnosis not present

## 2022-02-17 DIAGNOSIS — C73 Malignant neoplasm of thyroid gland: Secondary | ICD-10-CM

## 2022-02-17 DIAGNOSIS — Z5111 Encounter for antineoplastic chemotherapy: Secondary | ICD-10-CM | POA: Diagnosis present

## 2022-02-17 DIAGNOSIS — E039 Hypothyroidism, unspecified: Secondary | ICD-10-CM

## 2022-02-17 DIAGNOSIS — G893 Neoplasm related pain (acute) (chronic): Secondary | ICD-10-CM | POA: Diagnosis not present

## 2022-02-17 DIAGNOSIS — M546 Pain in thoracic spine: Secondary | ICD-10-CM

## 2022-02-17 LAB — CMP (CANCER CENTER ONLY)
ALT: 16 U/L (ref 0–44)
AST: 57 U/L — ABNORMAL HIGH (ref 15–41)
Albumin: 3.6 g/dL (ref 3.5–5.0)
Alkaline Phosphatase: 140 U/L — ABNORMAL HIGH (ref 38–126)
Anion gap: 8 (ref 5–15)
BUN: 13 mg/dL (ref 6–20)
CO2: 26 mmol/L (ref 22–32)
Calcium: 8.9 mg/dL (ref 8.9–10.3)
Chloride: 100 mmol/L (ref 98–111)
Creatinine: 0.89 mg/dL (ref 0.61–1.24)
GFR, Estimated: >60 mL/min (ref >60)
Glucose, Bld: 129 mg/dL — ABNORMAL HIGH (ref 70–99)
Potassium: 4.3 mmol/L (ref 3.5–5.1)
Sodium: 134 mmol/L — ABNORMAL LOW (ref 135–145)
Total Bilirubin: 1.3 mg/dL — ABNORMAL HIGH (ref 0.3–1.2)
Total Protein: 6.6 g/dL (ref 6.5–8.1)

## 2022-02-17 LAB — CBC WITH DIFFERENTIAL (CANCER CENTER ONLY)
Abs Immature Granulocytes: 0.04 10*3/uL (ref 0.00–0.07)
Basophils Absolute: 0 10*3/uL (ref 0.0–0.1)
Basophils Relative: 0 %
Eosinophils Absolute: 0 10*3/uL (ref 0.0–0.5)
Eosinophils Relative: 0 %
HCT: 35.6 % — ABNORMAL LOW (ref 39.0–52.0)
Hemoglobin: 11.2 g/dL — ABNORMAL LOW (ref 13.0–17.0)
Immature Granulocytes: 1 %
Lymphocytes Relative: 4 %
Lymphs Abs: 0.3 10*3/uL — ABNORMAL LOW (ref 0.7–4.0)
MCH: 30.9 pg (ref 26.0–34.0)
MCHC: 31.5 g/dL (ref 30.0–36.0)
MCV: 98.1 fL (ref 80.0–100.0)
Monocytes Absolute: 1.5 10*3/uL — ABNORMAL HIGH (ref 0.1–1.0)
Monocytes Relative: 20 %
Neutro Abs: 5.7 10*3/uL (ref 1.7–7.7)
Neutrophils Relative %: 75 %
Platelet Count: 178 10*3/uL (ref 150–400)
RBC: 3.63 MIL/uL — ABNORMAL LOW (ref 4.22–5.81)
RDW: 16.5 % — ABNORMAL HIGH (ref 11.5–15.5)
WBC Count: 7.5 10*3/uL (ref 4.0–10.5)
nRBC: 0 % (ref 0.0–0.2)

## 2022-02-17 LAB — TSH: TSH: 33.503 u[IU]/mL — ABNORMAL HIGH (ref 0.350–4.500)

## 2022-02-17 LAB — LACTATE DEHYDROGENASE: LDH: 776 U/L — ABNORMAL HIGH (ref 98–192)

## 2022-02-17 MED ORDER — HYDROMORPHONE HCL 1 MG/ML IJ SOLN
1.0000 mg | Freq: Once | INTRAMUSCULAR | Status: AC
  Administered 2022-02-17: 1 mg via INTRAVENOUS
  Filled 2022-02-17: qty 1

## 2022-02-17 MED ORDER — TEMAZEPAM 30 MG PO CAPS
30.0000 mg | ORAL_CAPSULE | Freq: Every evening | ORAL | 0 refills | Status: DC | PRN
  Filled 2022-02-17: qty 15, 15d supply, fill #0
  Filled 2022-03-09: qty 15, 15d supply, fill #1

## 2022-02-17 MED ORDER — SODIUM CHLORIDE 0.9 % IV SOLN
10.0000 mg | Freq: Once | INTRAVENOUS | Status: AC
  Administered 2022-02-17: 10 mg via INTRAVENOUS
  Filled 2022-02-17: qty 10

## 2022-02-17 MED ORDER — XTAMPZA ER 36 MG PO C12A
36.0000 mg | EXTENDED_RELEASE_CAPSULE | Freq: Three times a day (TID) | ORAL | 0 refills | Status: DC
  Filled 2022-02-17 – 2022-03-09 (×3): qty 90, 30d supply, fill #0

## 2022-02-17 MED ORDER — HEPARIN SOD (PORK) LOCK FLUSH 100 UNIT/ML IV SOLN: 500.0000 [IU] | Freq: Once | INTRAVENOUS | Status: DC | PRN

## 2022-02-17 MED ORDER — LEUCOVORIN CALCIUM INJECTION 350 MG
400.0000 mg/m2 | Freq: Once | INTRAVENOUS | Status: AC
  Administered 2022-02-17: 964 mg via INTRAVENOUS
  Filled 2022-02-17: qty 48.2

## 2022-02-17 MED ORDER — TESTOSTERONE CYPIONATE 200 MG/ML IM SOLN
400.0000 mg | INTRAMUSCULAR | Status: DC
  Administered 2022-02-17: 400 mg via INTRAMUSCULAR
  Filled 2022-02-17: qty 2

## 2022-02-17 MED ORDER — PALONOSETRON HCL INJECTION 0.25 MG/5ML
0.2500 mg | Freq: Once | INTRAVENOUS | Status: AC
  Administered 2022-02-17: 0.25 mg via INTRAVENOUS
  Filled 2022-02-17: qty 5

## 2022-02-17 MED ORDER — PREDNISONE 50 MG PO TABS
ORAL_TABLET | ORAL | 1 refills | Status: DC
  Filled 2022-02-17: qty 3, 1d supply, fill #0
  Filled 2022-06-16: qty 3, 1d supply, fill #1

## 2022-02-17 MED ORDER — DIPHENHYDRAMINE HCL 25 MG PO CAPS
50.0000 mg | ORAL_CAPSULE | Freq: Once | ORAL | 0 refills | Status: DC
  Filled 2022-02-17: qty 100, 50d supply, fill #0

## 2022-02-17 MED ORDER — SODIUM CHLORIDE 0.9% FLUSH: 10.0000 mL | INTRAVENOUS | Status: DC | PRN

## 2022-02-17 MED ORDER — OXALIPLATIN CHEMO INJECTION 100 MG/20ML
67.0000 mg/m2 | Freq: Once | INTRAVENOUS | Status: AC
  Administered 2022-02-17: 160 mg via INTRAVENOUS
  Filled 2022-02-17: qty 32

## 2022-02-17 MED ORDER — DEXTROSE 5 % IV SOLN: Freq: Once | INTRAVENOUS | Status: AC

## 2022-02-17 MED ORDER — SODIUM CHLORIDE 0.9 % IV SOLN
1900.0000 mg/m2 | INTRAVENOUS | Status: DC
  Administered 2022-02-17: 4600 mg via INTRAVENOUS
  Filled 2022-02-17: qty 92

## 2022-02-17 NOTE — Progress Notes (Signed)
Hematology and Oncology Follow Up Visit  Brian [name] [id number] [birth year] 47 y.o. 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 + Bilateral pulmonary emboli without right heart strain  DVT left popliteal vein    Past Therapy: Cabometyx 40 mg po q day -- start on 04/17/2019 - d/c on 07/17/2019 for progression Lenvima 24 mg po q day -- on hold due to proteinuria Taxotere/CDDP -- s/p cycle 4 - started on 09/13/2019 Adriamycin weekly 2 weeks on 1 week off - s/p cycle 8 --d/c on 04/16/2020 due to  progression Gavreto 400 mg po q day -- started on 05/17/2020  -- d/c on 06/16/2020 Pembrolizumab 200 mg IV q 3 week -- s/p cycle #8 - start on 06/26/2020 -- d/c on 01/16/2021 due to progression Vinblastine q week x 4 weeks on / 1 week off -- s/p  cycle #2 -- start on 02/24/2021 -- d/c on 05/20/2021 XRT for LEFT shoulder met -completed on 06/22/2021   Current Therapy:     FOLFOX -- s/p cycle #6 -- start on 07/08/2021 Zometa 4 mg IV q 2 months - next dose on 05/2022 Eliquis 5 mg po BID Testosterone 400 mg IM q 2 weeks   Interim History:  Brian [name] is here today for follow-up and treatment.  We did go ahead and do scans on him.  He had the PET scan done on 02/11/2022.  The PET scan shows that he was still responding.  He had improved appearance of pulmonary nodules and hepatic metastasis.  He had decreased activity and nodal disease in the abdomen.  However, there was noted to be some focal increased activity in the cerebellum.  I am not sure what this means but of course, we have to get an MRI for this.  His bony metastatic disease is also stable.  He did have a MRI that was done.  This was done on 02/02/2022.  Again, he is responding with the liver.  He does have a large right hepatic mass which might be slightly larger.  However, he has decreased in most of his hepatic metastasis.  He says he just does not feel as well.  I do not know  if this might be from testosterone.  Only last checked his testosterone level in early December, the testosterone was 341.  He does have a high TSH.  I think he needs to have a high TSH because of the Hurthle cell tumor.  He is having no problems with bowels or bladder.  There is no cough.  He had a nice Christmas and New Year's.  His son was born on [place].  There was tolerated this.  He has had worsening pain.  We will have to probably change his long-acting oxycodone to every 8 hour dosing.  He has had no bleeding.  There is been a little bit of swelling in his legs.  I told him to get back onto the Lasix.  Currently, I would have said that his performance status is probably ECOG 1.   Medications:  Allergies as of 02/17/2022       Reactions   Dextromethorphan-guaifenesin Other (See Comments)   Irregular heartbeat   Doxycycline Anaphylaxis, Nausea And Vomiting, Rash, Other (See Comments)   "heart arrythmia" and "dyspepsia" (only oral doxycycline causes reaction)   Gadobutrol Hives, Other (See Comments)   Patient had MRI scan at [facility]. Patient called one hour after he  left imaging facility to report two "blisters" that came up on "back" of lip.    Gadolinium Derivatives Hives   Patient had MRI scan at [facility]. Patient called one hour after he left imaging facility to report two "blisters" that came up on "back" of lip.    Guaifenesin Palpitations      Ibuprofen Hives, Itching   Lisinopril Other (See Comments)   Angioedema   Pantoprazole Itching   Tramadol Hives, Itching   Nexavar [sorafenib] Hives   Barium Rash   Developed redness around neck after drinking 1st bottle of Barocat; pt was given Benedryl by ED Mds to be "on the safe side" before drinking 2nd bottle   Chlorhexidine Hives        Medication List        Accurate as of February 17, 2022  8:44 AM. If you have any questions, ask your nurse or doctor.          azithromycin 250 MG  tablet Commonly known as: Zithromax Z-Pak Take 2 tablets by mouth on the first day, and then take 1 tablet daily until you run out.   cyclobenzaprine 10 MG tablet Commonly known as: FLEXERIL Take 1 tablet (10 mg total) by mouth 3 (three) times daily as needed for muscle spasms.   diphenhydrAMINE 25 mg capsule Commonly known as: BENADRYL Take 2 capsules (50 mg total) by mouth once for 1 dose.   Eliquis 5 MG Tabs tablet Generic drug: apixaban TAKE 1 TABLET (5 MG TOTAL) BY MOUTH 2 (TWO) TIMES DAILY.   EPINEPHrine 0.3 mg/0.3 mL Soaj injection Commonly known as: EpiPen 2-Pak USE AS DIRECTED FOR LIFE THREATENING ALLERGIC REACTIONS   famotidine 40 MG tablet Commonly known as: PEPCID Take 1 tablet (40 mg total) by mouth 2 (two) times daily.   Fusion Plus Caps Take 1 capsule by mouth daily.   HYDROmorphone 8 MG tablet Commonly known as: DILAUDID Take 1 tablet (8 mg total) by mouth every 4 (four) hours as needed for severe pain.   levothyroxine 200 MCG tablet Commonly known as: SYNTHROID Take one tablet (200 mcg dose) by mouth daily. Total:  288 mcg/day.   LORazepam 1 MG tablet Commonly known as: ATIVAN Take 1 tablet (1 mg total) by mouth every 6 (six) hours as needed for anxiety (for nausea and vomiting).   methylphenidate 10 MG tablet Commonly known as: RITALIN Take 1 tablet (10 mg total) by mouth 2 (two) times daily.   metoCLOPramide 10 MG tablet Commonly known as: REGLAN Take 1 tablet (10 mg total) by mouth every 6 (six) hours as needed for nausea or vomiting.   metoprolol tartrate 50 MG tablet Commonly known as: Lopressor Take 1 tablet (50 mg total) by mouth 2 (two) times daily.   multivitamin capsule Take 1 capsule by mouth daily.   OLANZapine 10 MG tablet Commonly known as: ZYPREXA TAKE 1 TABLET (10 MG TOTAL) BY MOUTH AT BEDTIME.   PEG 3350 17 GM/SCOOP Powd Mix 17 grams (1 Capful) in 8 ounces of liquid and drink by mouth daily as needed.   potassium chloride  SA 20 MEQ tablet Commonly known as: KLOR-CON M Take 1 tablet (20 mEq total) by mouth daily.   predniSONE 50 MG tablet Commonly known as: DELTASONE TAKE 1 TABLET BY MOUTH 13 HOURS BEFORE CT SCAN, 1 TABLET 7 HOURS BEFORE CT SCAN, AND 1 TABLET 1 HOUR BEFORE CT SCAN   prochlorperazine 10 MG tablet Commonly known as: COMPAZINE Take 1 tablet (10 mg  total) by mouth every 6 (six) hours as needed (Nausea or vomiting).   promethazine 25 MG tablet Commonly known as: PHENERGAN Take 1 tablet (25 mg total) by mouth every 6 (six) hours as needed for nausea and vomiting.   senna-docusate 8.6-50 MG tablet Commonly known as: Senokot-S Take 1 tablet by mouth at bedtime as needed for mild constipation.   tamsulosin 0.4 MG Caps capsule Commonly known as: Flomax Take 1 capsule (0.4 mg total) by mouth daily after supper.   temazepam 30 MG capsule Commonly known as: RESTORIL Take 1 capsule (30 mg total) by mouth at bedtime as needed for sleep.   Xtampza ER 36 MG C12a Generic drug: oxyCODONE ER Take 1 capsule (36 mg total) by mouth every 12 (twelve) hours        Allergies:  Allergies  Allergen Reactions   Dextromethorphan-Guaifenesin Other (See Comments)    Irregular heartbeat    Doxycycline Anaphylaxis, Nausea And Vomiting, Rash and Other (See Comments)    "heart arrythmia" and "dyspepsia" (only oral doxycycline causes reaction)   Gadobutrol Hives and Other (See Comments)    Patient had MRI scan at [place]. Patient called one hour after he left imaging facility to report two "blisters" that came up on "back" of lip.     Gadolinium Derivatives Hives    Patient had MRI scan at [place]. Patient called one hour after he left imaging facility to report two "blisters" that came up on "back" of lip.    Guaifenesin Palpitations        Ibuprofen Hives and Itching   Lisinopril Other (See Comments)    Angioedema   Pantoprazole Itching   Tramadol Hives and Itching   Nexavar  [Sorafenib] Hives   Barium Rash    Developed redness around neck after drinking 1st bottle of Barocat; pt was given Benedryl by ED Mds to be "on the safe side" before drinking 2nd bottle   Chlorhexidine Hives    Past Medical History, Surgical history, Social history, and Family History were reviewed and updated.  Review of Systems: All other 10 point review of systems is negative.   Physical Exam:  height is _0  (1.778 m) and weight is 274 lb (124.3 kg). His oral temperature is 98.1 F (36.7 C). His blood pressure is 121/73 and his pulse is 98. His respiration is 18 and oxygen saturation is 99%.   Wt Readings from Last 3 Encounters:  02/17/22 274 lb (124.3 kg)  01/26/22 282 lb (127.9 kg)  01/05/22 275 lb (124.7 kg)    Ocular: Sclerae unicteric, pupils equal, round and reactive to light Ear-nose-throat: Oropharynx clear, dentition fair Lymphatic: No cervical or supraclavicular adenopathy Lungs no rales or rhonchi, good excursion bilaterally Heart regular rate and rhythm, no murmur appreciated Abd soft, nontender, positive bowel sounds MSK no focal spinal tenderness, no joint edema Neuro: non-focal, well-oriented, appropriate affect Breasts: Deferred   Lab Results  Component Value Date   WBC 7.5 02/17/2022   HGB 11.2 (L) 02/17/2022   HCT 35.6 (L) 02/17/2022   MCV 98.1 02/17/2022   PLT 178 02/17/2022   Lab Results  Component Value Date   FERRITIN 423 (H) 01/05/2022   IRON 80 01/05/2022   TIBC 350 01/05/2022   UIBC 270 01/05/2022   IRONPCTSAT 23 01/05/2022   Lab Results  Component Value Date   RETICCTPCT 3.9 (H) 08/08/2020   RBC 3.63 (L) 02/17/2022   No results found for: "KPAFRELGTCHN", "LAMBDASER", "KAPLAMBRATIO" No results found for: "IGGSERUM", "  IGA", "IGMSERUM" No results found for: "TOTALPROTELP", "ALBUMINELP", "A1GS", "A2GS", "BETS", "BETA2SER", "GAMS", "MSPIKE", "SPEI"   Chemistry      Component Value Date/Time   NA 135 01/26/2022 0845   NA 141  02/06/2019 1210   K 3.8 01/26/2022 0845   CL 101 01/26/2022 0845   CO2 28 01/26/2022 0845   BUN 15 01/26/2022 0845   BUN 8 02/06/2019 1210   CREATININE 0.95 01/26/2022 0845      Component Value Date/Time   CALCIUM 9.5 01/26/2022 0845   ALKPHOS 155 (H) 01/26/2022 0845   AST 50 (H) 01/26/2022 0845   ALT 22 01/26/2022 0845   BILITOT 1.4 (H) 01/26/2022 0845       Impression and Plan: Brian [name] is a very pleasant 47 yo caucasian gentleman with an 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    He has tolerated FOLFOX nicely so far.  Surprisingly, it is still working.  I am not sure what to make of the PET scan with respect to the cerebellum.  Hopefully, he will have the MRI of the brain this week.  We will given him testosterone today.  He will go ahead with the FOLFOX.  He had some extra time off with this cycle.  I think this helped out quite a bit.  He was should also get his Delton See today.  I will plan to have him come back in 3 weeks.  [name], MD 1/2/20248:44 AM

## 2022-02-17 NOTE — Patient Instructions (Signed)

## 2022-02-17 NOTE — Patient Instructions (Signed)
[facility]  Discharge Instructions: Thank you for choosing [facility] to provide your oncology and hematology care.   If you have a lab appointment with the [facility], please go directly to the [facility] and check in at the registration area.  Wear comfortable clothing and clothing appropriate for easy access to any Portacath or PICC line.   We strive to give you quality time with your provider. You may need to reschedule your appointment if you arrive late (15 or more minutes).  Arriving late affects you and other patients whose appointments are after yours.  Also, if you miss three or more appointments without notifying the office, you may be dismissed from the clinic at the provider's discretion.      For prescription refill requests, have your pharmacy contact our office and allow 72 hours for refills to be completed.    Today you received the following chemotherapy and/or immunotherapy agents 5FU, Oxaliplatin      To help prevent nausea and vomiting after your treatment, we encourage you to take your nausea medication as directed.  BELOW ARE SYMPTOMS THAT SHOULD BE REPORTED IMMEDIATELY: *FEVER GREATER THAN 100.4 F (38 C) OR HIGHER *CHILLS OR SWEATING *NAUSEA AND VOMITING THAT IS NOT CONTROLLED WITH YOUR NAUSEA MEDICATION *UNUSUAL SHORTNESS OF BREATH *UNUSUAL BRUISING OR BLEEDING *URINARY PROBLEMS (pain or burning when urinating, or frequent urination) *BOWEL PROBLEMS (unusual diarrhea, constipation, pain near the anus) TENDERNESS IN MOUTH AND THROAT WITH OR WITHOUT PRESENCE OF ULCERS (sore throat, sores in mouth, or a toothache) UNUSUAL RASH, SWELLING OR PAIN  UNUSUAL VAGINAL DISCHARGE OR ITCHING   Items with * indicate a potential emergency and should be followed up as soon as possible or go to the Emergency Department if any problems should occur.  Please show the CHEMOTHERAPY ALERT CARD or IMMUNOTHERAPY ALERT CARD at check-in  to the Emergency Department and triage nurse. Should you have questions after your visit or need to cancel or reschedule your appointment, please contact [facility]  [phone] and follow the prompts.  Office hours are 8:00 a.m. to 4:30 p.m. Monday - Friday. Please note that voicemails left after 4:00 p.m. may not be returned until the following business day.  We are closed weekends and major holidays. You have access to a nurse at all times for urgent questions. Please call the main number to the clinic [phone] and follow the prompts.  For any non-urgent questions, you may also contact your provider using MyChart. We now offer e-Visits for anyone 39 and older to request care online for non-urgent symptoms. For details visit [email or web address].   Also download the MyChart app! Go to the app store, search "MyChart", open the app, select [facility], and log in with your MyChart username and password.

## 2022-02-18 ENCOUNTER — Encounter: Payer: Self-pay | Admitting: *Deleted

## 2022-02-18 ENCOUNTER — Telehealth: Payer: Self-pay | Admitting: *Deleted

## 2022-02-18 ENCOUNTER — Emergency Department (HOSPITAL_COMMUNITY): Payer: BC Managed Care – PPO

## 2022-02-18 ENCOUNTER — Emergency Department (HOSPITAL_COMMUNITY)
Admission: EM | Admit: 2022-02-18 | Discharge: 2022-02-18 | Discharge status: Against Medical Advice | Payer: BC Managed Care – PPO | Attending: Emergency Medicine | Admitting: Emergency Medicine

## 2022-02-18 ENCOUNTER — Ambulatory Visit (HOSPITAL_COMMUNITY): Payer: BC Managed Care – PPO

## 2022-02-18 ENCOUNTER — Other Ambulatory Visit: Payer: Self-pay

## 2022-02-18 ENCOUNTER — Other Ambulatory Visit (HOSPITAL_BASED_OUTPATIENT_CLINIC_OR_DEPARTMENT_OTHER): Payer: Self-pay

## 2022-02-18 DIAGNOSIS — R0602 Shortness of breath: Secondary | ICD-10-CM | POA: Diagnosis not present

## 2022-02-18 DIAGNOSIS — Z85850 Personal history of malignant neoplasm of thyroid: Secondary | ICD-10-CM | POA: Diagnosis not present

## 2022-02-18 DIAGNOSIS — Z5321 Procedure and treatment not carried out due to patient leaving prior to being seen by health care provider: Secondary | ICD-10-CM | POA: Insufficient documentation

## 2022-02-18 DIAGNOSIS — R079 Chest pain, unspecified: Secondary | ICD-10-CM | POA: Diagnosis not present

## 2022-02-18 LAB — TESTOSTERONE: Testosterone: 263 ng/dL — ABNORMAL LOW (ref 264–916)

## 2022-02-18 MED ORDER — IOHEXOL 350 MG/ML SOLN
100.0000 mL | Freq: Once | INTRAVENOUS | Status: AC | PRN
  Administered 2022-02-18: 100 mL via INTRAVENOUS

## 2022-02-18 NOTE — ED Triage Notes (Signed)
Pt reports shortness of breath with activity since this morning. Also reports chest pressure. Pt hx cancer, on iv chemo.

## 2022-02-18 NOTE — ED Notes (Signed)
Patient requesting lab draw from IV. Triage RN made aware.

## 2022-02-18 NOTE — Telephone Encounter (Signed)
Message received from patient to inform Dr. Marin Olp that he is on the way to the [facility] ER due to new shortness of breath and chest pressure. Dr. Marin Olp notified.

## 2022-02-18 NOTE — Progress Notes (Signed)
His plan has denied oxyCODONE ER (XTAMPZA ER) 36 MG C12A and will not revise their decision despite the trial of other medications, which I listed.  Dr. Marin Olp made aware.

## 2022-02-18 NOTE — ED Provider Triage Note (Signed)
Emergency Medicine Provider Triage Evaluation Note  Brian [name] , a 47 y.o. male  was evaluated in triage.  Pt complains of chest pain and shortness of breath.  Patient is a cancer patient of Dr. Marin Olp, has thyroid cancer which has metastasized to lungs.  Patient reports he recently received infusion for chemotherapy.  Patient reports waking up this morning with chest pain and shortness of breath that is worse with exertion.  Patient also having lower extremity swelling.  Patient reports history of PE some number of years ago, is compliant on anticoagulation.  Patient had fever last week however symptoms cleared.  Patient denies nausea, vomiting, diarrhea.  Patient denies abdominal pain.  Review of Systems  Positive:  Negative:   Physical Exam  BP (!) 130/98   Pulse 96   Temp 97.9 F (36.6 C) (Oral)   Resp 17   Ht 5\' 10"  (1.778 m)   Wt 124.3 kg   SpO2 95%   BMI 39.31 kg/m  Gen:   Awake, no distress   Resp:  Normal effort  MSK:   Moves extremities without difficulty  Other:    Medical Decision Making  Medically screening exam initiated at 5:05 PM.  Appropriate orders placed.  Brian [name] was informed that the remainder of the evaluation will be completed by another provider, this initial triage assessment does not replace that evaluation, and the importance of remaining in the ED until their evaluation is complete.     Azucena Cecil, PA-C 02/18/22 1706

## 2022-02-19 ENCOUNTER — Inpatient Hospital Stay: Payer: BC Managed Care – PPO

## 2022-02-19 ENCOUNTER — Other Ambulatory Visit (HOSPITAL_BASED_OUTPATIENT_CLINIC_OR_DEPARTMENT_OTHER): Payer: Self-pay

## 2022-02-19 ENCOUNTER — Ambulatory Visit (HOSPITAL_COMMUNITY)
Admission: RE | Admit: 2022-02-19 | Discharge: 2022-02-19 | Disposition: A | Discharge status: Home / Self Care | Payer: BC Managed Care – PPO | Source: Ambulatory Visit | Attending: Hematology & Oncology | Admitting: Hematology & Oncology

## 2022-02-19 VITALS — BP 127/89 | HR 99 | Temp 98.0°F | Resp 19

## 2022-02-19 DIAGNOSIS — C73 Malignant neoplasm of thyroid gland: Secondary | ICD-10-CM | POA: Insufficient documentation

## 2022-02-19 MED ORDER — GADOBUTROL 1 MMOL/ML IV SOLN
10.0000 mL | Freq: Once | INTRAVENOUS | Status: AC | PRN
  Administered 2022-02-19: 10 mL via INTRAVENOUS

## 2022-02-19 MED ORDER — SODIUM CHLORIDE 0.9% FLUSH
10.0000 mL | INTRAVENOUS | Status: DC | PRN
  Administered 2022-02-19: 10 mL

## 2022-02-19 MED ORDER — HEPARIN SOD (PORK) LOCK FLUSH 100 UNIT/ML IV SOLN
500.0000 [IU] | Freq: Once | INTRAVENOUS | Status: AC | PRN
  Administered 2022-02-19: 500 [IU]

## 2022-02-20 ENCOUNTER — Other Ambulatory Visit: Payer: Self-pay

## 2022-02-20 ENCOUNTER — Other Ambulatory Visit (HOSPITAL_BASED_OUTPATIENT_CLINIC_OR_DEPARTMENT_OTHER): Payer: Self-pay

## 2022-02-23 ENCOUNTER — Other Ambulatory Visit (HOSPITAL_BASED_OUTPATIENT_CLINIC_OR_DEPARTMENT_OTHER): Payer: Self-pay

## 2022-02-25 ENCOUNTER — Encounter: Payer: Self-pay | Admitting: Hematology & Oncology

## 2022-02-25 ENCOUNTER — Encounter (HOSPITAL_BASED_OUTPATIENT_CLINIC_OR_DEPARTMENT_OTHER): Payer: Self-pay

## 2022-02-25 ENCOUNTER — Other Ambulatory Visit (HOSPITAL_BASED_OUTPATIENT_CLINIC_OR_DEPARTMENT_OTHER): Payer: Self-pay

## 2022-02-25 ENCOUNTER — Encounter: Payer: Self-pay | Admitting: Family

## 2022-03-04 ENCOUNTER — Other Ambulatory Visit: Payer: Self-pay

## 2022-03-09 ENCOUNTER — Encounter: Payer: Self-pay | Admitting: Hematology & Oncology

## 2022-03-09 ENCOUNTER — Encounter: Payer: Self-pay | Admitting: Family

## 2022-03-09 ENCOUNTER — Other Ambulatory Visit: Payer: Self-pay

## 2022-03-09 ENCOUNTER — Other Ambulatory Visit (HOSPITAL_BASED_OUTPATIENT_CLINIC_OR_DEPARTMENT_OTHER): Payer: Self-pay

## 2022-03-09 ENCOUNTER — Other Ambulatory Visit: Payer: Self-pay | Admitting: Hematology & Oncology

## 2022-03-09 ENCOUNTER — Other Ambulatory Visit: Payer: Self-pay | Admitting: Family

## 2022-03-09 DIAGNOSIS — M546 Pain in thoracic spine: Secondary | ICD-10-CM

## 2022-03-09 DIAGNOSIS — M62838 Other muscle spasm: Secondary | ICD-10-CM

## 2022-03-09 DIAGNOSIS — C73 Malignant neoplasm of thyroid gland: Secondary | ICD-10-CM

## 2022-03-09 DIAGNOSIS — R110 Nausea: Secondary | ICD-10-CM

## 2022-03-09 DIAGNOSIS — C787 Secondary malignant neoplasm of liver and intrahepatic bile duct: Secondary | ICD-10-CM

## 2022-03-09 DIAGNOSIS — G893 Neoplasm related pain (acute) (chronic): Secondary | ICD-10-CM

## 2022-03-09 MED ORDER — PROMETHAZINE HCL 25 MG PO TABS
25.0000 mg | ORAL_TABLET | Freq: Four times a day (QID) | ORAL | 0 refills | Status: DC | PRN
  Filled 2022-03-09: qty 120, 30d supply, fill #0

## 2022-03-09 MED ORDER — PROCHLORPERAZINE MALEATE 10 MG PO TABS
10.0000 mg | ORAL_TABLET | Freq: Four times a day (QID) | ORAL | 1 refills | Status: DC | PRN
  Filled 2022-03-09: qty 30, 8d supply, fill #0
  Filled 2022-04-03: qty 30, 8d supply, fill #1

## 2022-03-09 MED ORDER — XTAMPZA ER 36 MG PO C12A
36.0000 mg | EXTENDED_RELEASE_CAPSULE | Freq: Two times a day (BID) | ORAL | 0 refills | Status: AC
  Filled 2022-03-09: qty 60, 30d supply, fill #0

## 2022-03-09 MED ORDER — CYCLOBENZAPRINE HCL 10 MG PO TABS
10.0000 mg | ORAL_TABLET | Freq: Three times a day (TID) | ORAL | 1 refills | Status: DC | PRN
  Filled 2022-03-09: qty 30, 10d supply, fill #0
  Filled 2022-04-03: qty 30, 10d supply, fill #1

## 2022-03-09 MED ORDER — METOCLOPRAMIDE HCL 10 MG PO TABS
10.0000 mg | ORAL_TABLET | Freq: Four times a day (QID) | ORAL | 6 refills | Status: DC | PRN
  Filled 2022-03-09: qty 30, 8d supply, fill #0
  Filled 2022-04-03: qty 30, 8d supply, fill #1
  Filled 2022-05-18: qty 30, 8d supply, fill #2

## 2022-03-09 MED ORDER — LORAZEPAM 1 MG PO TABS
1.0000 mg | ORAL_TABLET | Freq: Four times a day (QID) | ORAL | 0 refills | Status: DC | PRN
  Filled 2022-03-09: qty 30, 8d supply, fill #0

## 2022-03-10 ENCOUNTER — Inpatient Hospital Stay: Payer: BC Managed Care – PPO

## 2022-03-10 ENCOUNTER — Inpatient Hospital Stay: Payer: BC Managed Care – PPO | Admitting: Hematology & Oncology

## 2022-03-10 ENCOUNTER — Other Ambulatory Visit: Payer: Self-pay

## 2022-03-10 ENCOUNTER — Encounter: Payer: Self-pay | Admitting: Hematology & Oncology

## 2022-03-10 ENCOUNTER — Other Ambulatory Visit (HOSPITAL_BASED_OUTPATIENT_CLINIC_OR_DEPARTMENT_OTHER): Payer: Self-pay

## 2022-03-10 VITALS — BP 113/80 | HR 102 | Temp 99.7°F | Resp 18 | Ht 70.0 in | Wt 268.0 lb

## 2022-03-10 VITALS — HR 87

## 2022-03-10 DIAGNOSIS — C787 Secondary malignant neoplasm of liver and intrahepatic bile duct: Secondary | ICD-10-CM

## 2022-03-10 DIAGNOSIS — D509 Iron deficiency anemia, unspecified: Secondary | ICD-10-CM

## 2022-03-10 DIAGNOSIS — C73 Malignant neoplasm of thyroid gland: Secondary | ICD-10-CM

## 2022-03-10 DIAGNOSIS — R110 Nausea: Secondary | ICD-10-CM

## 2022-03-10 DIAGNOSIS — C799 Secondary malignant neoplasm of unspecified site: Secondary | ICD-10-CM | POA: Diagnosis not present

## 2022-03-10 DIAGNOSIS — G893 Neoplasm related pain (acute) (chronic): Secondary | ICD-10-CM

## 2022-03-10 DIAGNOSIS — E291 Testicular hypofunction: Secondary | ICD-10-CM

## 2022-03-10 DIAGNOSIS — M546 Pain in thoracic spine: Secondary | ICD-10-CM

## 2022-03-10 LAB — CBC WITH DIFFERENTIAL (CANCER CENTER ONLY)
Abs Immature Granulocytes: 0.04 10*3/uL (ref 0.00–0.07)
Basophils Absolute: 0 10*3/uL (ref 0.0–0.1)
Basophils Relative: 1 %
Eosinophils Absolute: 0 10*3/uL (ref 0.0–0.5)
Eosinophils Relative: 1 %
HCT: 36.8 % — ABNORMAL LOW (ref 39.0–52.0)
Hemoglobin: 11.4 g/dL — ABNORMAL LOW (ref 13.0–17.0)
Immature Granulocytes: 1 %
Lymphocytes Relative: 8 %
Lymphs Abs: 0.3 10*3/uL — ABNORMAL LOW (ref 0.7–4.0)
MCH: 29.6 pg (ref 26.0–34.0)
MCHC: 31 g/dL (ref 30.0–36.0)
MCV: 95.6 fL (ref 80.0–100.0)
Monocytes Absolute: 1.2 10*3/uL — ABNORMAL HIGH (ref 0.1–1.0)
Monocytes Relative: 28 %
Neutro Abs: 2.6 10*3/uL (ref 1.7–7.7)
Neutrophils Relative %: 61 %
Platelet Count: 193 10*3/uL (ref 150–400)
RBC: 3.85 MIL/uL — ABNORMAL LOW (ref 4.22–5.81)
RDW: 18.3 % — ABNORMAL HIGH (ref 11.5–15.5)
WBC Count: 4.3 10*3/uL (ref 4.0–10.5)
nRBC: 0 % (ref 0.0–0.2)

## 2022-03-10 LAB — IRON AND IRON BINDING CAPACITY (CC-WL,HP ONLY)
Iron: 39 ug/dL — ABNORMAL LOW (ref 45–182)
Saturation Ratios: 15 % — ABNORMAL LOW (ref 17.9–39.5)
TIBC: 265 ug/dL (ref 250–450)
UIBC: 226 ug/dL (ref 117–376)

## 2022-03-10 LAB — CMP (CANCER CENTER ONLY)
ALT: 16 U/L (ref 0–44)
AST: 44 U/L — ABNORMAL HIGH (ref 15–41)
Albumin: 3.9 g/dL (ref 3.5–5.0)
Alkaline Phosphatase: 172 U/L — ABNORMAL HIGH (ref 38–126)
Anion gap: 9 (ref 5–15)
BUN: 18 mg/dL (ref 6–20)
CO2: 28 mmol/L (ref 22–32)
Calcium: 10.4 mg/dL — ABNORMAL HIGH (ref 8.9–10.3)
Chloride: 96 mmol/L — ABNORMAL LOW (ref 98–111)
Creatinine: 1.06 mg/dL (ref 0.61–1.24)
GFR, Estimated: >60 mL/min (ref >60)
Glucose, Bld: 129 mg/dL — ABNORMAL HIGH (ref 70–99)
Potassium: 4 mmol/L (ref 3.5–5.1)
Sodium: 133 mmol/L — ABNORMAL LOW (ref 135–145)
Total Bilirubin: 1.1 mg/dL (ref 0.3–1.2)
Total Protein: 6.8 g/dL (ref 6.5–8.1)

## 2022-03-10 LAB — RETICULOCYTES
Immature Retic Fract: 32.2 % — ABNORMAL HIGH (ref 2.3–15.9)
RBC.: 3.81 MIL/uL — ABNORMAL LOW (ref 4.22–5.81)
Retic Count, Absolute: 110.9 10*3/uL (ref 19.0–186.0)
Retic Ct Pct: 2.9 % (ref 0.4–3.1)

## 2022-03-10 LAB — FERRITIN: Ferritin: 642 ng/mL — ABNORMAL HIGH (ref 24–336)

## 2022-03-10 LAB — TSH: TSH: 41.825 u[IU]/mL — ABNORMAL HIGH (ref 0.350–4.500)

## 2022-03-10 MED ORDER — HYDROMORPHONE HCL 1 MG/ML IJ SOLN
2.0000 mg | INTRAMUSCULAR | Status: DC | PRN
  Administered 2022-03-10: 2 mg via INTRAVENOUS
  Filled 2022-03-10: qty 2

## 2022-03-10 MED ORDER — OXALIPLATIN CHEMO INJECTION 100 MG/20ML
67.0000 mg/m2 | Freq: Once | INTRAVENOUS | Status: AC
  Administered 2022-03-10: 160 mg via INTRAVENOUS
  Filled 2022-03-10: qty 32

## 2022-03-10 MED ORDER — HYDROMORPHONE HCL 8 MG PO TABS
8.0000 mg | ORAL_TABLET | ORAL | 0 refills | Status: DC | PRN
  Filled 2022-03-10: qty 120, 20d supply, fill #0

## 2022-03-10 MED ORDER — LEUCOVORIN CALCIUM INJECTION 350 MG
400.0000 mg/m2 | Freq: Once | INTRAVENOUS | Status: AC
  Administered 2022-03-10: 964 mg via INTRAVENOUS
  Filled 2022-03-10: qty 48.2

## 2022-03-10 MED ORDER — ZOLEDRONIC ACID 4 MG/100ML IV SOLN
4.0000 mg | Freq: Once | INTRAVENOUS | Status: AC
  Administered 2022-03-10: 4 mg via INTRAVENOUS
  Filled 2022-03-10: qty 100

## 2022-03-10 MED ORDER — TESTOSTERONE CYPIONATE 200 MG/ML IM SOLN
400.0000 mg | INTRAMUSCULAR | Status: DC
  Administered 2022-03-10: 400 mg via INTRAMUSCULAR
  Filled 2022-03-10: qty 2

## 2022-03-10 MED ORDER — PALONOSETRON HCL INJECTION 0.25 MG/5ML
0.2500 mg | Freq: Once | INTRAVENOUS | Status: AC
  Administered 2022-03-10: 0.25 mg via INTRAVENOUS
  Filled 2022-03-10: qty 5

## 2022-03-10 MED ORDER — SODIUM CHLORIDE 0.9 % IV SOLN
10.0000 mg | Freq: Once | INTRAVENOUS | Status: AC
  Administered 2022-03-10: 10 mg via INTRAVENOUS
  Filled 2022-03-10: qty 10

## 2022-03-10 MED ORDER — DEXTROSE 5 % IV SOLN: Freq: Once | INTRAVENOUS | Status: AC

## 2022-03-10 MED ORDER — SODIUM CHLORIDE 0.9 % IV SOLN
1900.0000 mg/m2 | INTRAVENOUS | Status: DC
  Administered 2022-03-10: 4600 mg via INTRAVENOUS
  Filled 2022-03-10: qty 92

## 2022-03-10 MED ORDER — SODIUM CHLORIDE 0.9 % IV SOLN: Freq: Once | INTRAVENOUS | Status: AC

## 2022-03-10 NOTE — Progress Notes (Signed)
MD aware HR 102 ok to treat.

## 2022-03-10 NOTE — Progress Notes (Signed)
Hematology and Oncology Follow Up Visit  Brian [name] [id number] Sep 16, 1975 46 y.o. 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 + Bilateral pulmonary emboli without right heart strain  DVT left popliteal vein    Past Therapy: Cabometyx 40 mg po q day -- start on 04/17/2019 - d/c on 07/17/2019 for progression Lenvima 24 mg po q day -- on hold due to proteinuria Taxotere/CDDP -- s/p cycle 4 - started on 09/13/2019 Adriamycin weekly 2 weeks on 1 week off - s/p cycle 8 --d/c on 04/16/2020 due to  progression Gavreto 400 mg po q day -- started on 05/17/2020  -- d/c on 06/16/2020 Pembrolizumab 200 mg IV q 3 week -- s/p cycle #8 - start on 06/26/2020 -- d/c on 01/16/2021 due to progression Vinblastine q week x 4 weeks on / 1 week off -- s/p  cycle #2 -- start on 02/24/2021 -- d/c on 05/20/2021 XRT for LEFT shoulder met -completed on 06/22/2021   Current Therapy:     FOLFOX -- s/p cycle #7 -- start on 07/08/2021 Zometa 4 mg IV q 2 months - next dose on 05/2022 Eliquis 5 mg po BID Testosterone 400 mg IM q 2 weeks   Interim History:  Brian [name] is here today for follow-up and treatment.  He is feeling fairly well.  We did do an MRI of the brain.  Again, I am unsure exactly what the radiologist was calling.  There is no obvious malignancy.  However there is some changes that may have suggested some process going on.  We will just have to follow-up.  He does not have any cerebellar issues I can see.  There has been no problems with cough or shortness of breath.  He continues on the Eliquis.  Testosterone is helped his energy.  He feels better with the testosterone.  He will get his injection today.  He has had no problems with bleeding.  There is been no issues with diarrhea.  He has had a little bit of leg swelling but not too bad.  He has had no problems with rashes.  Overall, I would have said that his performance status is  probably ECOG 1.   Medications:  Allergies as of 03/10/2022       Reactions   Dextromethorphan-guaifenesin Other (See Comments)   Irregular heartbeat   Doxycycline Anaphylaxis, Nausea And Vomiting, Rash, Other (See Comments)   "heart arrythmia" and "dyspepsia" (only oral doxycycline causes reaction)   Gadobutrol Hives, Other (See Comments)   Patient had MRI scan at [facility]. Patient called one hour after he left imaging facility to report two "blisters" that came up on "back" of lip.    Gadolinium Derivatives Hives   Patient had MRI scan at [facility]. Patient called one hour after he left imaging facility to report two "blisters" that came up on "back" of lip.    Guaifenesin Palpitations      Ibuprofen Hives, Itching   Lisinopril Other (See Comments)   Angioedema   Pantoprazole Itching   Tramadol Hives, Itching   Nexavar [sorafenib] Hives   Barium Rash   Developed redness around neck after drinking 1st bottle of Barocat; pt was given Benedryl by ED Mds to be "on the safe side" before drinking 2nd bottle   Chlorhexidine Hives        Medication List        Accurate as of March 10, 2022  8:53 AM. If you have any questions, ask your nurse or doctor.          azithromycin 250 MG tablet Commonly known as: Zithromax Z-Pak Take 2 tablets by mouth on the first day, and then take 1 tablet daily until you run out.   cyclobenzaprine 10 MG tablet Commonly known as: FLEXERIL Take 1 tablet (10 mg total) by mouth 3 (three) times daily as needed for muscle spasms.   diphenhydrAMINE 25 mg capsule Commonly known as: BENADRYL Take 2 capsules (50 mg total) by mouth once for 1 dose.   Eliquis 5 MG Tabs tablet Generic drug: apixaban TAKE 1 TABLET (5 MG TOTAL) BY MOUTH 2 (TWO) TIMES DAILY.   EPINEPHrine 0.3 mg/0.3 mL Soaj injection Commonly known as: EpiPen 2-Pak USE AS DIRECTED FOR LIFE THREATENING ALLERGIC REACTIONS   famotidine 40 MG tablet Commonly known as:  PEPCID Take 1 tablet (40 mg total) by mouth 2 (two) times daily.   Fusion Plus Caps Take 1 capsule by mouth daily.   HYDROmorphone 8 MG tablet Commonly known as: DILAUDID Take 1 tablet (8 mg total) by mouth every 4 (four) hours as needed for severe pain.   levothyroxine 200 MCG tablet Commonly known as: SYNTHROID Take one tablet (200 mcg dose) by mouth daily. Total:  288 mcg/day.   LORazepam 1 MG tablet Commonly known as: ATIVAN Take 1 tablet (1 mg total) by mouth every 6 (six) hours as needed for anxiety (for nausea and vomiting).   methylphenidate 10 MG tablet Commonly known as: RITALIN Take 1 tablet (10 mg total) by mouth 2 (two) times daily.   metoCLOPramide 10 MG tablet Commonly known as: REGLAN Take 1 tablet (10 mg total) by mouth every 6 (six) hours as needed for nausea or vomiting.   metoprolol tartrate 50 MG tablet Commonly known as: Lopressor Take 1 tablet (50 mg total) by mouth 2 (two) times daily.   multivitamin capsule Take 1 capsule by mouth daily.   OLANZapine 10 MG tablet Commonly known as: ZYPREXA TAKE 1 TABLET (10 MG TOTAL) BY MOUTH AT BEDTIME.   PEG 3350 17 GM/SCOOP Powd Mix 17 grams (1 Capful) in 8 ounces of liquid and drink by mouth daily as needed.   potassium chloride SA 20 MEQ tablet Commonly known as: KLOR-CON M Take 1 tablet (20 mEq total) by mouth daily.   predniSONE 50 MG tablet Commonly known as: DELTASONE TAKE 1 TABLET BY MOUTH 13 HOURS BEFORE CT SCAN, 1 TABLET 7 HOURS BEFORE CT SCAN, AND 1 TABLET 1 HOUR BEFORE CT SCAN   prochlorperazine 10 MG tablet Commonly known as: COMPAZINE Take 1 tablet (10 mg total) by mouth every 6 (six) hours as needed (Nausea or vomiting).   promethazine 25 MG tablet Commonly known as: PHENERGAN Take 1 tablet (25 mg total) by mouth every 6 (six) hours as needed for nausea and vomiting.   senna-docusate 8.6-50 MG tablet Commonly known as: Senokot-S Take 1 tablet by mouth at bedtime as needed for mild  constipation.   tamsulosin 0.4 MG Caps capsule Commonly known as: Flomax Take 1 capsule (0.4 mg total) by mouth daily after supper.   temazepam 30 MG capsule Commonly known as: RESTORIL Take 1 capsule (30 mg total) by mouth at bedtime as needed for sleep.   Xtampza ER 36 MG C12a Generic drug: oxyCODONE ER Take 1 capsule (36 mg total) by mouth every 8 (eight) hours. Take 1 capsule every 8 hours.   Xtampza ER 36 MG C12a Generic  drug: oxyCODONE ER Take 1 capsule (36 mg total) by mouth every 12 (twelve) hours        Allergies:  Allergies  Allergen Reactions   Dextromethorphan-Guaifenesin Other (See Comments)    Irregular heartbeat    Doxycycline Anaphylaxis, Nausea And Vomiting, Rash and Other (See Comments)    "heart arrythmia" and "dyspepsia" (only oral doxycycline causes reaction)   Gadobutrol Hives and Other (See Comments)    Patient had MRI scan at [facility]. Patient called one hour after he left imaging facility to report two "blisters" that came up on "back" of lip.     Gadolinium Derivatives Hives    Patient had MRI scan at [facility]. Patient called one hour after he left imaging facility to report two "blisters" that came up on "back" of lip.    Guaifenesin Palpitations        Ibuprofen Hives and Itching   Lisinopril Other (See Comments)    Angioedema   Pantoprazole Itching   Tramadol Hives and Itching   Nexavar [Sorafenib] Hives   Barium Rash    Developed redness around neck after drinking 1st bottle of Barocat; pt was given Benedryl by ED Mds to be "on the safe side" before drinking 2nd bottle   Chlorhexidine Hives    Past Medical History, Surgical history, Social history, and Family History were reviewed and updated.  Review of Systems: Review of Systems  Constitutional:  Positive for weight loss.  HENT: Negative.    Eyes: Negative.   Respiratory: Negative.    Cardiovascular:  Positive for palpitations.  Gastrointestinal:  Positive  for heartburn.  Genitourinary: Negative.   Musculoskeletal:  Positive for back pain, joint pain and myalgias.  Skin: Negative.   Neurological: Negative.   Endo/Heme/Allergies: Negative.   Psychiatric/Behavioral: Negative.       Physical Exam:  height is 5\' 10"  (1.778 m) and weight is 268 lb (121.6 kg). His oral temperature is 99.7 F (37.6 C). His blood pressure is 113/80 and his pulse is 102 (abnormal). His respiration is 18 and oxygen saturation is 97%.   Wt Readings from Last 3 Encounters:  03/10/22 268 lb (121.6 kg)  02/18/22 274 lb (124.3 kg)  02/17/22 274 lb (124.3 kg)    Physical Exam Vitals reviewed.  HENT:     Head: Normocephalic and atraumatic.  Eyes:     Pupils: Pupils are equal, round, and reactive to light.  Cardiovascular:     Rate and Rhythm: Normal rate and regular rhythm.     Heart sounds: Normal heart sounds.  Pulmonary:     Effort: Pulmonary effort is normal.     Breath sounds: Normal breath sounds.  Abdominal:     General: Bowel sounds are normal.     Palpations: Abdomen is soft.  Musculoskeletal:        General: No tenderness or deformity. Normal range of motion.     Cervical back: Normal range of motion.  Lymphadenopathy:     Cervical: No cervical adenopathy.  Skin:    General: Skin is warm and dry.     Findings: No erythema or rash.  Neurological:     Mental Status: He is alert and oriented to person, place, and time.  Psychiatric:        Behavior: Behavior normal.        Thought Content: Thought content normal.        Judgment: Judgment normal.      Lab Results  Component Value Date   WBC  4.3 03/10/2022   HGB 11.4 (L) 03/10/2022   HCT 36.8 (L) 03/10/2022   MCV 95.6 03/10/2022   PLT 193 03/10/2022   Lab Results  Component Value Date   FERRITIN 423 (H) 01/05/2022   IRON 80 01/05/2022   TIBC 350 01/05/2022   UIBC 270 01/05/2022   IRONPCTSAT 23 01/05/2022   Lab Results  Component Value Date   RETICCTPCT 2.9 03/10/2022   RBC  3.81 (L) 03/10/2022   RBC 3.85 (L) 03/10/2022   No results found for: "KPAFRELGTCHN", "LAMBDASER", "KAPLAMBRATIO" No results found for: "IGGSERUM", "IGA", "IGMSERUM" No results found for: "TOTALPROTELP", "ALBUMINELP", "A1GS", "A2GS", "BETS", "BETA2SER", "GAMS", "MSPIKE", "SPEI"   Chemistry      Component Value Date/Time   NA 133 (L) 03/10/2022 0758   NA 141 02/06/2019 1210   K 4.0 03/10/2022 0758   CL 96 (L) 03/10/2022 0758   CO2 28 03/10/2022 0758   BUN 18 03/10/2022 0758   BUN 8 02/06/2019 1210   CREATININE 1.06 03/10/2022 0758      Component Value Date/Time   CALCIUM 10.4 (H) 03/10/2022 0758   ALKPHOS 172 (H) 03/10/2022 0758   AST 44 (H) 03/10/2022 0758   ALT 16 03/10/2022 0758   BILITOT 1.1 03/10/2022 0758       Impression and Plan: Brian [name] is a very pleasant 47 yo caucasian gentleman with an 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    He has tolerated FOLFOX nicely so far.  Surprisingly, it is still working.  We will go ahead with his treatments.  Again, get his treatments every 3 weeks.  I think this is the best way to try to treat him so his blood counts are at a decent level.  I know that he does have some ancillary therapies that we do.  He probably will need some IV iron.  We will set this up on Thursday when he comes back.  I think we can just follow along with the PET scan for right now.  Will plan to get him back in 3 weeks.  Josph Macho, MD 1/23/20248:53 AM

## 2022-03-10 NOTE — Patient Instructions (Signed)

## 2022-03-10 NOTE — Patient Instructions (Signed)
[facility]  Discharge Instructions: Thank you for choosing [facility] to provide your oncology and hematology care.   If you have a lab appointment with the Cancer Center, please go directly to the Cancer Center and check in at the registration area.  Wear comfortable clothing and clothing appropriate for easy access to any Portacath or PICC line.   We strive to give you quality time with your provider. You may need to reschedule your appointment if you arrive late (15 or more minutes).  Arriving late affects you and other patients whose appointments are after yours.  Also, if you miss three or more appointments without notifying the office, you may be dismissed from the clinic at the provider's discretion.      For prescription refill requests, have your pharmacy contact our office and allow 72 hours for refills to be completed.    Today you received the following chemotherapy and/or immunotherapy agents Oxaliplatin, 5U, Leucovorin      To help prevent nausea and vomiting after your treatment, we encourage you to take your nausea medication as directed.  BELOW ARE SYMPTOMS THAT SHOULD BE REPORTED IMMEDIATELY: *FEVER GREATER THAN 100.4 F (38 C) OR HIGHER *CHILLS OR SWEATING *NAUSEA AND VOMITING THAT IS NOT CONTROLLED WITH YOUR NAUSEA MEDICATION *UNUSUAL SHORTNESS OF BREATH *UNUSUAL BRUISING OR BLEEDING *URINARY PROBLEMS (pain or burning when urinating, or frequent urination) *BOWEL PROBLEMS (unusual diarrhea, constipation, pain near the anus) TENDERNESS IN MOUTH AND THROAT WITH OR WITHOUT PRESENCE OF ULCERS (sore throat, sores in mouth, or a toothache) UNUSUAL RASH, SWELLING OR PAIN  UNUSUAL VAGINAL DISCHARGE OR ITCHING   Items with * indicate a potential emergency and should be followed up as soon as possible or go to the Emergency Department if any problems should occur.  Please show the CHEMOTHERAPY ALERT CARD or IMMUNOTHERAPY  ALERT CARD at check-in to the Emergency Department and triage nurse. Should you have questions after your visit or need to cancel or reschedule your appointment, please contact [facility]  [phone] and follow the prompts.  Office hours are 8:00 a.m. to 4:30 p.m. Monday - Friday. Please note that voicemails left after 4:00 p.m. may not be returned until the following business day.  We are closed weekends and major holidays. You have access to a nurse at all times for urgent questions. Please call the main number to the clinic [phone] and follow the prompts.  For any non-urgent questions, you may also contact your provider using MyChart. We now offer e-Visits for anyone 27 and older to request care online for non-urgent symptoms. For details visit [email or web address].   Also download the MyChart app! Go to the app store, search "MyChart", open the app, select [place], and log in with your MyChart username and password.

## 2022-03-11 ENCOUNTER — Other Ambulatory Visit: Payer: Self-pay

## 2022-03-12 ENCOUNTER — Other Ambulatory Visit (HOSPITAL_BASED_OUTPATIENT_CLINIC_OR_DEPARTMENT_OTHER): Payer: Self-pay

## 2022-03-12 ENCOUNTER — Other Ambulatory Visit: Payer: Self-pay

## 2022-03-12 ENCOUNTER — Inpatient Hospital Stay: Payer: BC Managed Care – PPO

## 2022-03-12 VITALS — BP 121/82 | HR 90 | Resp 17

## 2022-03-12 DIAGNOSIS — C73 Malignant neoplasm of thyroid gland: Secondary | ICD-10-CM | POA: Diagnosis not present

## 2022-03-12 DIAGNOSIS — E291 Testicular hypofunction: Secondary | ICD-10-CM

## 2022-03-12 DIAGNOSIS — D509 Iron deficiency anemia, unspecified: Secondary | ICD-10-CM

## 2022-03-12 LAB — TESTOSTERONE: Testosterone: 235 ng/dL — ABNORMAL LOW (ref 264–916)

## 2022-03-12 MED ORDER — SODIUM CHLORIDE 0.9 % IV SOLN
200.0000 mg | Freq: Once | INTRAVENOUS | Status: AC
  Administered 2022-03-12: 200 mg via INTRAVENOUS
  Filled 2022-03-12: qty 200

## 2022-03-12 MED ORDER — SODIUM CHLORIDE 0.9 % IV SOLN: Freq: Once | INTRAVENOUS | Status: AC

## 2022-03-12 MED ORDER — SODIUM CHLORIDE 0.9% FLUSH
10.0000 mL | INTRAVENOUS | Status: DC | PRN
  Administered 2022-03-12: 10 mL

## 2022-03-12 MED ORDER — HEPARIN SOD (PORK) LOCK FLUSH 100 UNIT/ML IV SOLN
500.0000 [IU] | Freq: Once | INTRAVENOUS | Status: AC | PRN
  Administered 2022-03-12: 500 [IU]

## 2022-03-12 NOTE — Progress Notes (Signed)
Patient declined to stay for the post infusion observation period. Patient denies any difficulty with this infusion in the past and is aware to call with any questions or concerns.   Pt verbalized understanding and had no further questions today.   

## 2022-03-12 NOTE — Patient Instructions (Signed)
Iron Sucrose Injection What is this medication? IRON SUCROSE (EYE ern SOO krose) treats low levels of iron (iron deficiency anemia) in people with kidney disease. Iron is a mineral that plays an important role in making red blood cells, which carry oxygen from your lungs to the rest of your body. This medicine may be used for other purposes; ask your health care provider or pharmacist if you have questions. COMMON BRAND NAME(S): Venofer What should I tell my care team before I take this medication? They need to know if you have any of these conditions: Anemia not caused by low iron levels Heart disease High levels of iron in the blood Kidney disease Liver disease An unusual or allergic reaction to iron, other medications, foods, dyes, or preservatives Pregnant or trying to get pregnant Breastfeeding How should I use this medication? This medication is for infusion into a vein. It is given in a hospital or clinic setting. Talk to your care team about the use of this medication in children. While this medication may be prescribed for children as young as 2 years for selected conditions, precautions do apply. Overdosage: If you think you have taken too much of this medicine contact a poison control center or emergency room at once. NOTE: This medicine is only for you. Do not share this medicine with others. What if I miss a dose? Keep appointments for follow-up doses. It is important not to miss your dose. Call your care team if you are unable to keep an appointment. What may interact with this medication? Do not take this medication with any of the following: Deferoxamine Dimercaprol Other iron products This medication may also interact with the following: Chloramphenicol Deferasirox This list may not describe all possible interactions. Give your health care provider a list of all the medicines, herbs, non-prescription drugs, or dietary supplements you use. Also tell them if you smoke,  drink alcohol, or use illegal drugs. Some items may interact with your medicine. What should I watch for while using this medication? Visit your care team regularly. Tell your care team if your symptoms do not start to get better or if they get worse. You may need blood work done while you are taking this medication. You may need to follow a special diet. Talk to your care team. Foods that contain iron include: whole grains/cereals, dried fruits, beans, or peas, leafy green vegetables, and organ meats (liver, kidney). What side effects may I notice from receiving this medication? Side effects that you should report to your care team as soon as possible: Allergic reactions--skin rash, itching, hives, swelling of the face, lips, tongue, or throat Low blood pressure--dizziness, feeling faint or lightheaded, blurry vision Shortness of breath Side effects that usually do not require medical attention (report to your care team if they continue or are bothersome): Flushing Headache Joint pain Muscle pain Nausea Pain, redness, or irritation at injection site This list may not describe all possible side effects. Call your doctor for medical advice about side effects. You may report side effects to FDA at 1-800-FDA-1088. Where should I keep my medication? This medication is given in a hospital or clinic and will not be stored at home. NOTE: This sheet is a summary. It may not cover all possible information. If you have questions about this medicine, talk to your doctor, pharmacist, or health care provider.  2023 Elsevier/Gold Standard (2020-05-16 00:00:00)

## 2022-03-12 NOTE — Patient Instructions (Signed)

## 2022-03-13 ENCOUNTER — Other Ambulatory Visit (HOSPITAL_BASED_OUTPATIENT_CLINIC_OR_DEPARTMENT_OTHER): Payer: Self-pay

## 2022-03-18 ENCOUNTER — Telehealth: Payer: Self-pay

## 2022-03-18 NOTE — Telephone Encounter (Signed)
Spoke with the patient on 03/16/2022 regarding paper work from [employer].  Explained to the patient these papers are "Mental Health Attending Physicians Statement" and to please call [employer] and ask for the correct papers to be sent.  Patient verbalized understanding.  Dr. Antonieta Pert office made aware.

## 2022-03-19 ENCOUNTER — Other Ambulatory Visit (HOSPITAL_BASED_OUTPATIENT_CLINIC_OR_DEPARTMENT_OTHER): Payer: Self-pay

## 2022-03-23 ENCOUNTER — Other Ambulatory Visit (HOSPITAL_BASED_OUTPATIENT_CLINIC_OR_DEPARTMENT_OTHER): Payer: Self-pay

## 2022-03-24 ENCOUNTER — Encounter: Payer: Self-pay | Admitting: *Deleted

## 2022-03-30 ENCOUNTER — Encounter (HOSPITAL_BASED_OUTPATIENT_CLINIC_OR_DEPARTMENT_OTHER): Payer: Self-pay

## 2022-03-30 ENCOUNTER — Other Ambulatory Visit (HOSPITAL_BASED_OUTPATIENT_CLINIC_OR_DEPARTMENT_OTHER): Payer: Self-pay

## 2022-03-31 ENCOUNTER — Inpatient Hospital Stay: Payer: BC Managed Care – PPO

## 2022-03-31 ENCOUNTER — Inpatient Hospital Stay: Payer: BC Managed Care – PPO | Attending: Hematology & Oncology

## 2022-03-31 ENCOUNTER — Encounter: Payer: Self-pay | Admitting: Hematology & Oncology

## 2022-03-31 ENCOUNTER — Inpatient Hospital Stay: Payer: BC Managed Care – PPO | Admitting: Hematology & Oncology

## 2022-03-31 VITALS — BP 118/77 | HR 98 | Resp 18 | Wt 270.2 lb

## 2022-03-31 DIAGNOSIS — C73 Malignant neoplasm of thyroid gland: Secondary | ICD-10-CM

## 2022-03-31 DIAGNOSIS — E291 Testicular hypofunction: Secondary | ICD-10-CM

## 2022-03-31 DIAGNOSIS — Z5111 Encounter for antineoplastic chemotherapy: Secondary | ICD-10-CM | POA: Insufficient documentation

## 2022-03-31 DIAGNOSIS — Z79899 Other long term (current) drug therapy: Secondary | ICD-10-CM | POA: Diagnosis not present

## 2022-03-31 DIAGNOSIS — D509 Iron deficiency anemia, unspecified: Secondary | ICD-10-CM | POA: Insufficient documentation

## 2022-03-31 DIAGNOSIS — C787 Secondary malignant neoplasm of liver and intrahepatic bile duct: Secondary | ICD-10-CM | POA: Diagnosis present

## 2022-03-31 DIAGNOSIS — M546 Pain in thoracic spine: Secondary | ICD-10-CM

## 2022-03-31 LAB — CBC WITH DIFFERENTIAL (CANCER CENTER ONLY)
Abs Immature Granulocytes: 0.02 10*3/uL (ref 0.00–0.07)
Basophils Absolute: 0 10*3/uL (ref 0.0–0.1)
Basophils Relative: 1 %
Eosinophils Absolute: 0 10*3/uL (ref 0.0–0.5)
Eosinophils Relative: 0 %
HCT: 35.6 % — ABNORMAL LOW (ref 39.0–52.0)
Hemoglobin: 10.8 g/dL — ABNORMAL LOW (ref 13.0–17.0)
Immature Granulocytes: 1 %
Lymphocytes Relative: 6 %
Lymphs Abs: 0.3 10*3/uL — ABNORMAL LOW (ref 0.7–4.0)
MCH: 28.2 pg (ref 26.0–34.0)
MCHC: 30.3 g/dL (ref 30.0–36.0)
MCV: 93 fL (ref 80.0–100.0)
Monocytes Absolute: 0.9 10*3/uL (ref 0.1–1.0)
Monocytes Relative: 22 %
Neutro Abs: 2.9 10*3/uL (ref 1.7–7.7)
Neutrophils Relative %: 70 %
Platelet Count: 186 10*3/uL (ref 150–400)
RBC: 3.83 MIL/uL — ABNORMAL LOW (ref 4.22–5.81)
RDW: 19.4 % — ABNORMAL HIGH (ref 11.5–15.5)
WBC Count: 4.1 10*3/uL (ref 4.0–10.5)
nRBC: 0 % (ref 0.0–0.2)

## 2022-03-31 LAB — CMP (CANCER CENTER ONLY)
ALT: 13 U/L (ref 0–44)
AST: 37 U/L (ref 15–41)
Albumin: 3.8 g/dL (ref 3.5–5.0)
Alkaline Phosphatase: 175 U/L — ABNORMAL HIGH (ref 38–126)
Anion gap: 9 (ref 5–15)
BUN: 15 mg/dL (ref 6–20)
CO2: 27 mmol/L (ref 22–32)
Calcium: 9.1 mg/dL (ref 8.9–10.3)
Chloride: 99 mmol/L (ref 98–111)
Creatinine: 0.96 mg/dL (ref 0.61–1.24)
GFR, Estimated: >60 mL/min (ref >60)
Glucose, Bld: 121 mg/dL — ABNORMAL HIGH (ref 70–99)
Potassium: 3.8 mmol/L (ref 3.5–5.1)
Sodium: 135 mmol/L (ref 135–145)
Total Bilirubin: 1.3 mg/dL — ABNORMAL HIGH (ref 0.3–1.2)
Total Protein: 7 g/dL (ref 6.5–8.1)

## 2022-03-31 LAB — IRON AND IRON BINDING CAPACITY (CC-WL,HP ONLY)
Iron: 21 ug/dL — ABNORMAL LOW (ref 45–182)
Saturation Ratios: 9 % — ABNORMAL LOW (ref 17.9–39.5)
TIBC: 234 ug/dL — ABNORMAL LOW (ref 250–450)
UIBC: 213 ug/dL (ref 117–376)

## 2022-03-31 LAB — LACTATE DEHYDROGENASE: LDH: 604 U/L — ABNORMAL HIGH (ref 98–192)

## 2022-03-31 LAB — FERRITIN: Ferritin: 1125 ng/mL — ABNORMAL HIGH (ref 24–336)

## 2022-03-31 MED ORDER — SODIUM CHLORIDE 0.9% FLUSH: 10.0000 mL | INTRAVENOUS | Status: DC | PRN

## 2022-03-31 MED ORDER — DEXTROSE 5 % IV SOLN: Freq: Once | INTRAVENOUS | Status: AC

## 2022-03-31 MED ORDER — SODIUM CHLORIDE 0.9 % IV SOLN
1900.0000 mg/m2 | INTRAVENOUS | Status: DC
  Administered 2022-03-31: 5000 mg via INTRAVENOUS
  Filled 2022-03-31: qty 100

## 2022-03-31 MED ORDER — TESTOSTERONE CYPIONATE 200 MG/ML IM SOLN
400.0000 mg | INTRAMUSCULAR | Status: DC
  Administered 2022-03-31: 400 mg via INTRAMUSCULAR
  Filled 2022-03-31: qty 2

## 2022-03-31 MED ORDER — LEUCOVORIN CALCIUM INJECTION 350 MG
400.0000 mg/m2 | Freq: Once | INTRAVENOUS | Status: AC
  Administered 2022-03-31: 964 mg via INTRAVENOUS
  Filled 2022-03-31: qty 48.2

## 2022-03-31 MED ORDER — PALONOSETRON HCL INJECTION 0.25 MG/5ML
0.2500 mg | Freq: Once | INTRAVENOUS | Status: AC
  Administered 2022-03-31: 0.25 mg via INTRAVENOUS
  Filled 2022-03-31: qty 5

## 2022-03-31 MED ORDER — SODIUM CHLORIDE 0.9 % IV SOLN
10.0000 mg | Freq: Once | INTRAVENOUS | Status: AC
  Administered 2022-03-31: 10 mg via INTRAVENOUS
  Filled 2022-03-31: qty 10

## 2022-03-31 MED ORDER — HEPARIN SOD (PORK) LOCK FLUSH 100 UNIT/ML IV SOLN: 500.0000 [IU] | Freq: Once | INTRAVENOUS | Status: DC | PRN

## 2022-03-31 MED ORDER — OXALIPLATIN CHEMO INJECTION 50 MG/10ML
67.0000 mg/m2 | Freq: Once | INTRAVENOUS | Status: AC
  Administered 2022-03-31: 150 mg via INTRAVENOUS
  Filled 2022-03-31: qty 20

## 2022-03-31 NOTE — Telephone Encounter (Signed)
Message left on patient's voice mail to follow up regarding papers from [facility] and the correct ones to be sent for disability.  No papers received as of today's date.

## 2022-03-31 NOTE — Progress Notes (Signed)
Hematology and Oncology Follow Up Visit  Brian [name] [id number] 18-Mar-1975 47 y.o. 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 + Bilateral pulmonary emboli without right heart strain  DVT left popliteal vein    Past Therapy: Cabometyx 40 mg po q day -- start on 04/17/2019 - d/c on 07/17/2019 for progression Lenvima 24 mg po q day -- on hold due to proteinuria Taxotere/CDDP -- s/p cycle 4 - started on 09/13/2019 Adriamycin weekly 2 weeks on 1 week off - s/p cycle 8 --d/c on 04/16/2020 due to  progression Gavreto 400 mg po q day -- started on 05/17/2020  -- d/c on 06/16/2020 Pembrolizumab 200 mg IV q 3 week -- s/p cycle #8 - start on 06/26/2020 -- d/c on 01/16/2021 due to progression Vinblastine q week x 4 weeks on / 1 week off -- s/p  cycle #2 -- start on 02/24/2021 -- d/c on 05/20/2021 XRT for LEFT shoulder met -completed on 06/22/2021   Current Therapy:     FOLFOX -- s/p cycle #8 -- start on 07/08/2021 Zometa 4 mg IV q 2 months - next dose on 05/2022 Eliquis 5 mg po BID Testosterone 400 mg IM q 2 weeks   Interim History:  Brian [name] is here today for follow-up and treatment.  He seems little bit lethargic today.  This may be from his pain medications.  We will just have to monitor this.  He does have some pain in the left hip.  He is getting around okay.  The pain seems to be worse when he bends down.  He has had no cough or shortness of breath.  He has had no problems with nausea or vomiting.  He has had no issues with bleeding.  He is on Eliquis.  We do give him testosterone injections.  When we last saw him, his testosterone was 235.  He has had no problems with rashes.  He has not noted any swollen lymph nodes.  He says that the pinna of his right ear is quite painful.  I looked at it.  There is a tiny little sore.  I told him to put some Neosporin on this.  Currently, I would have to say that his performance  status is probably ECOG 1.   Medications:  Allergies as of 03/31/2022       Reactions   Dextromethorphan-guaifenesin Other (See Comments)   Irregular heartbeat   Doxycycline Anaphylaxis, Nausea And Vomiting, Rash, Other (See Comments)   "heart arrythmia" and "dyspepsia" (only oral doxycycline causes reaction)   Gadobutrol Hives, Other (See Comments)   Patient had MRI scan at [place]. Patient called one hour after he left imaging facility to report two "blisters" that came up on "back" of lip.    Gadolinium Derivatives Hives   Patient had MRI scan at [place]. Patient called one hour after he left imaging facility to report two "blisters" that came up on "back" of lip.    Guaifenesin Palpitations      Ibuprofen Hives, Itching   Lisinopril Other (See Comments)   Angioedema   Pantoprazole Itching   Tramadol Hives, Itching   Nexavar [sorafenib] Hives   Barium Rash   Developed redness around neck after drinking 1st bottle of Barocat; pt was given Benedryl by ED Mds to be "on the safe side" before drinking 2nd bottle   Chlorhexidine Hives        Medication List  Accurate as of March 31, 2022  8:54 AM. If you have any questions, ask your nurse or doctor.          azithromycin 250 MG tablet Commonly known as: Zithromax Z-Pak Take 2 tablets by mouth on the first day, and then take 1 tablet daily until you run out.   cyclobenzaprine 10 MG tablet Commonly known as: FLEXERIL Take 1 tablet (10 mg total) by mouth 3 (three) times daily as needed for muscle spasms.   diphenhydrAMINE 25 mg capsule Commonly known as: BENADRYL Take 2 capsules (50 mg total) by mouth once for 1 dose.   Eliquis 5 MG Tabs tablet Generic drug: apixaban TAKE 1 TABLET (5 MG TOTAL) BY MOUTH 2 (TWO) TIMES DAILY.   EPINEPHrine 0.3 mg/0.3 mL Soaj injection Commonly known as: EpiPen 2-Pak USE AS DIRECTED FOR LIFE THREATENING ALLERGIC REACTIONS   famotidine 40 MG tablet Commonly  known as: PEPCID Take 1 tablet (40 mg total) by mouth 2 (two) times daily.   Fusion Plus Caps Take 1 capsule by mouth daily.   HYDROmorphone 8 MG tablet Commonly known as: DILAUDID Take 1 tablet (8 mg total) by mouth every 4 (four) hours as needed for severe pain.   levothyroxine 200 MCG tablet Commonly known as: SYNTHROID Take one tablet (200 mcg dose) by mouth daily. Total:  288 mcg/day.   LORazepam 1 MG tablet Commonly known as: ATIVAN Take 1 tablet (1 mg total) by mouth every 6 (six) hours as needed for anxiety (for nausea and vomiting).   methylphenidate 10 MG tablet Commonly known as: RITALIN Take 1 tablet (10 mg total) by mouth 2 (two) times daily.   metoCLOPramide 10 MG tablet Commonly known as: REGLAN Take 1 tablet (10 mg total) by mouth every 6 (six) hours as needed for nausea or vomiting.   metoprolol tartrate 50 MG tablet Commonly known as: Lopressor Take 1 tablet (50 mg total) by mouth 2 (two) times daily.   multivitamin capsule Take 1 capsule by mouth daily.   OLANZapine 10 MG tablet Commonly known as: ZYPREXA TAKE 1 TABLET (10 MG TOTAL) BY MOUTH AT BEDTIME.   PEG 3350 17 GM/SCOOP Powd Mix 17 grams (1 Capful) in 8 ounces of liquid and drink by mouth daily as needed.   potassium chloride SA 20 MEQ tablet Commonly known as: KLOR-CON M Take 1 tablet (20 mEq total) by mouth daily.   predniSONE 50 MG tablet Commonly known as: DELTASONE TAKE 1 TABLET BY MOUTH 13 HOURS BEFORE CT SCAN, 1 TABLET 7 HOURS BEFORE CT SCAN, AND 1 TABLET 1 HOUR BEFORE CT SCAN   prochlorperazine 10 MG tablet Commonly known as: COMPAZINE Take 1 tablet (10 mg total) by mouth every 6 (six) hours as needed (Nausea or vomiting).   promethazine 25 MG tablet Commonly known as: PHENERGAN Take 1 tablet (25 mg total) by mouth every 6 (six) hours as needed for nausea and vomiting.   senna-docusate 8.6-50 MG tablet Commonly known as: Senokot-S Take 1 tablet by mouth at bedtime as needed  for mild constipation.   tamsulosin 0.4 MG Caps capsule Commonly known as: Flomax Take 1 capsule (0.4 mg total) by mouth daily after supper.   temazepam 30 MG capsule Commonly known as: RESTORIL Take 1 capsule (30 mg total) by mouth at bedtime as needed for sleep.   Xtampza ER 36 MG C12a Generic drug: oxyCODONE ER Take 1 capsule (36 mg total) by mouth every 12 (twelve) hours  Allergies:  Allergies  Allergen Reactions   Dextromethorphan-Guaifenesin Other (See Comments)    Irregular heartbeat    Doxycycline Anaphylaxis, Nausea And Vomiting, Rash and Other (See Comments)    "heart arrythmia" and "dyspepsia" (only oral doxycycline causes reaction)   Gadobutrol Hives and Other (See Comments)    Patient had MRI scan at [place]. Patient called one hour after he left imaging facility to report two "blisters" that came up on "back" of lip.     Gadolinium Derivatives Hives    Patient had MRI scan at [place]. Patient called one hour after he left imaging facility to report two "blisters" that came up on "back" of lip.    Guaifenesin Palpitations        Ibuprofen Hives and Itching   Lisinopril Other (See Comments)    Angioedema   Pantoprazole Itching   Tramadol Hives and Itching   Nexavar [Sorafenib] Hives   Barium Rash    Developed redness around neck after drinking 1st bottle of Barocat; pt was given Benedryl by ED Mds to be "on the safe side" before drinking 2nd bottle   Chlorhexidine Hives    Past Medical History, Surgical history, Social history, and Family History were reviewed and updated.  Review of Systems: Review of Systems  Constitutional:  Positive for weight loss.  HENT: Negative.    Eyes: Negative.   Respiratory: Negative.    Cardiovascular:  Positive for palpitations.  Gastrointestinal:  Positive for heartburn.  Genitourinary: Negative.   Musculoskeletal:  Positive for back pain, joint pain and myalgias.  Skin: Negative.    Neurological: Negative.   Endo/Heme/Allergies: Negative.   Psychiatric/Behavioral: Negative.       Physical Exam:  weight is 270 lb 4 oz (122.6 kg). His blood pressure is 118/77 and his pulse is 98. His respiration is 18 and oxygen saturation is 98%.   Wt Readings from Last 3 Encounters:  03/31/22 270 lb 4 oz (122.6 kg)  03/10/22 268 lb (121.6 kg)  02/18/22 274 lb (124.3 kg)    Physical Exam Vitals reviewed.  HENT:     Head: Normocephalic and atraumatic.  Eyes:     Pupils: Pupils are equal, round, and reactive to light.  Cardiovascular:     Rate and Rhythm: Normal rate and regular rhythm.     Heart sounds: Normal heart sounds.  Pulmonary:     Effort: Pulmonary effort is normal.     Breath sounds: Normal breath sounds.  Abdominal:     General: Bowel sounds are normal.     Palpations: Abdomen is soft.  Musculoskeletal:        General: No tenderness or deformity. Normal range of motion.     Cervical back: Normal range of motion.  Lymphadenopathy:     Cervical: No cervical adenopathy.  Skin:    General: Skin is warm and dry.     Findings: No erythema or rash.  Neurological:     Mental Status: He is alert and oriented to person, place, and time.  Psychiatric:        Behavior: Behavior normal.        Thought Content: Thought content normal.        Judgment: Judgment normal.      Lab Results  Component Value Date   WBC 4.1 03/31/2022   HGB 10.8 (L) 03/31/2022   HCT 35.6 (L) 03/31/2022   MCV 93.0 03/31/2022   PLT 186 03/31/2022   Lab Results  Component Value Date  FERRITIN 642 (H) 03/10/2022   IRON 39 (L) 03/10/2022   TIBC 265 03/10/2022   UIBC 226 03/10/2022   IRONPCTSAT 15 (L) 03/10/2022   Lab Results  Component Value Date   RETICCTPCT 2.9 03/10/2022   RBC 3.83 (L) 03/31/2022   No results found for: "KPAFRELGTCHN", "LAMBDASER", "KAPLAMBRATIO" No results found for: "IGGSERUM", "IGA", "IGMSERUM" No results found for: "TOTALPROTELP", "ALBUMINELP",  "A1GS", "A2GS", "BETS", "BETA2SER", "GAMS", "MSPIKE", "SPEI"   Chemistry      Component Value Date/Time   NA 133 (L) 03/10/2022 0758   NA 141 02/06/2019 1210   K 4.0 03/10/2022 0758   CL 96 (L) 03/10/2022 0758   CO2 28 03/10/2022 0758   BUN 18 03/10/2022 0758   BUN 8 02/06/2019 1210   CREATININE 1.06 03/10/2022 0758      Component Value Date/Time   CALCIUM 10.4 (H) 03/10/2022 0758   ALKPHOS 172 (H) 03/10/2022 0758   AST 44 (H) 03/10/2022 0758   ALT 16 03/10/2022 0758   BILITOT 1.1 03/10/2022 0758       Impression and Plan: Brian [name] is a very pleasant 47 yo caucasian gentleman with an 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     He has tolerated FOLFOX nicely so far.  Hopefully, we will still see a response from your next of scans.  We will go ahead with his ninth cycle of treatment.  After the 10th cycle, we will then do scans on him.  We will plan to get him back to see [place] in 3 weeks.    [name], MD 2/13/20248:54 AM

## 2022-03-31 NOTE — Patient Instructions (Signed)

## 2022-03-31 NOTE — Patient Instructions (Signed)
[facility]  Discharge Instructions: Thank you for choosing [facility] to provide your oncology and hematology care.   If you have a lab appointment with the [facility], please go directly to the [facility] and check in at the registration area.  Wear comfortable clothing and clothing appropriate for easy access to any Portacath or PICC line.   We strive to give you quality time with your provider. You may need to reschedule your appointment if you arrive late (15 or more minutes).  Arriving late affects you and other patients whose appointments are after yours.  Also, if you miss three or more appointments without notifying the office, you may be dismissed from the clinic at the provider's discretion.      For prescription refill requests, have your pharmacy contact our office and allow 72 hours for refills to be completed.    Today you received the following chemotherapy and/or immunotherapy agents 5FU, Leucovorin, Oxaliplatin      To help prevent nausea and vomiting after your treatment, we encourage you to take your nausea medication as directed.  BELOW ARE SYMPTOMS THAT SHOULD BE REPORTED IMMEDIATELY: *FEVER GREATER THAN 100.4 F (38 C) OR HIGHER *CHILLS OR SWEATING *NAUSEA AND VOMITING THAT IS NOT CONTROLLED WITH YOUR NAUSEA MEDICATION *UNUSUAL SHORTNESS OF BREATH *UNUSUAL BRUISING OR BLEEDING *URINARY PROBLEMS (pain or burning when urinating, or frequent urination) *BOWEL PROBLEMS (unusual diarrhea, constipation, pain near the anus) TENDERNESS IN MOUTH AND THROAT WITH OR WITHOUT PRESENCE OF ULCERS (sore throat, sores in mouth, or a toothache) UNUSUAL RASH, SWELLING OR PAIN  UNUSUAL VAGINAL DISCHARGE OR ITCHING   Items with * indicate a potential emergency and should be followed up as soon as possible or go to the Emergency Department if any problems should occur.  Please show the CHEMOTHERAPY ALERT CARD or IMMUNOTHERAPY  ALERT CARD at check-in to the Emergency Department and triage nurse. Should you have questions after your visit or need to cancel or reschedule your appointment, please contact [facility]  [phone] and follow the prompts.  Office hours are 8:00 a.m. to 4:30 p.m. Monday - Friday. Please note that voicemails left after 4:00 p.m. may not be returned until the following business day.  We are closed weekends and major holidays. You have access to a nurse at all times for urgent questions. Please call the main number to the clinic [phone] and follow the prompts.  For any non-urgent questions, you may also contact your provider using MyChart. We now offer e-Visits for anyone 44 and older to request care online for non-urgent symptoms. For details visit [email or web address].   Also download the MyChart app! Go to the app store, search "MyChart", open the app, select [facility], and log in with your MyChart username and password.

## 2022-04-01 ENCOUNTER — Other Ambulatory Visit: Payer: Self-pay

## 2022-04-01 LAB — TESTOSTERONE: Testosterone: 186 ng/dL — ABNORMAL LOW (ref 264–916)

## 2022-04-02 ENCOUNTER — Inpatient Hospital Stay: Payer: BC Managed Care – PPO

## 2022-04-02 VITALS — BP 113/78 | HR 87 | Temp 98.0°F | Resp 18

## 2022-04-02 DIAGNOSIS — C73 Malignant neoplasm of thyroid gland: Secondary | ICD-10-CM | POA: Diagnosis not present

## 2022-04-02 DIAGNOSIS — E291 Testicular hypofunction: Secondary | ICD-10-CM

## 2022-04-02 DIAGNOSIS — D509 Iron deficiency anemia, unspecified: Secondary | ICD-10-CM

## 2022-04-02 MED ORDER — SODIUM CHLORIDE 0.9 % IV SOLN: Freq: Once | INTRAVENOUS | Status: AC

## 2022-04-02 MED ORDER — SODIUM CHLORIDE 0.9% FLUSH
10.0000 mL | INTRAVENOUS | Status: DC | PRN
  Administered 2022-04-02: 10 mL

## 2022-04-02 MED ORDER — SODIUM CHLORIDE 0.9 % IV SOLN
200.0000 mg | Freq: Once | INTRAVENOUS | Status: AC
  Administered 2022-04-02: 200 mg via INTRAVENOUS
  Filled 2022-04-02: qty 200

## 2022-04-02 MED ORDER — HEPARIN SOD (PORK) LOCK FLUSH 100 UNIT/ML IV SOLN
500.0000 [IU] | Freq: Once | INTRAVENOUS | Status: AC | PRN
  Administered 2022-04-02: 500 [IU]

## 2022-04-02 NOTE — Progress Notes (Signed)
Patient does not want to stay for the 30 minute post IV iron observation. Patient discharged ambulatory without complaints or concerns.

## 2022-04-02 NOTE — Patient Instructions (Signed)

## 2022-04-03 ENCOUNTER — Other Ambulatory Visit (HOSPITAL_BASED_OUTPATIENT_CLINIC_OR_DEPARTMENT_OTHER): Payer: Self-pay

## 2022-04-03 ENCOUNTER — Other Ambulatory Visit: Payer: Self-pay | Admitting: Hematology & Oncology

## 2022-04-03 ENCOUNTER — Encounter: Payer: Self-pay | Admitting: Hematology & Oncology

## 2022-04-03 ENCOUNTER — Encounter: Payer: Self-pay | Admitting: Family

## 2022-04-03 DIAGNOSIS — M546 Pain in thoracic spine: Secondary | ICD-10-CM

## 2022-04-03 DIAGNOSIS — R110 Nausea: Secondary | ICD-10-CM

## 2022-04-03 DIAGNOSIS — C799 Secondary malignant neoplasm of unspecified site: Secondary | ICD-10-CM

## 2022-04-03 DIAGNOSIS — C787 Secondary malignant neoplasm of liver and intrahepatic bile duct: Secondary | ICD-10-CM

## 2022-04-03 DIAGNOSIS — C73 Malignant neoplasm of thyroid gland: Secondary | ICD-10-CM

## 2022-04-03 MED ORDER — HYDROMORPHONE HCL 8 MG PO TABS
8.0000 mg | ORAL_TABLET | ORAL | 0 refills | Status: DC | PRN
  Filled 2022-04-03: qty 120, 20d supply, fill #0

## 2022-04-03 MED ORDER — LORAZEPAM 1 MG PO TABS
1.0000 mg | ORAL_TABLET | Freq: Four times a day (QID) | ORAL | 0 refills | Status: DC | PRN
  Filled 2022-04-03: qty 30, 8d supply, fill #0

## 2022-04-03 MED ORDER — TEMAZEPAM 30 MG PO CAPS
30.0000 mg | ORAL_CAPSULE | Freq: Every evening | ORAL | 0 refills | Status: DC | PRN
  Filled 2022-04-03: qty 30, 30d supply, fill #0

## 2022-04-03 MED ORDER — METHYLPHENIDATE HCL 10 MG PO TABS
10.0000 mg | ORAL_TABLET | Freq: Two times a day (BID) | ORAL | 0 refills | Status: DC
  Filled 2022-04-03: qty 60, 30d supply, fill #0

## 2022-04-03 MED ORDER — METOPROLOL TARTRATE 50 MG PO TABS
50.0000 mg | ORAL_TABLET | Freq: Two times a day (BID) | ORAL | 4 refills | Status: DC
  Filled 2022-04-03: qty 60, 30d supply, fill #0
  Filled 2022-05-18: qty 60, 30d supply, fill #1
  Filled 2022-07-09: qty 60, 30d supply, fill #2

## 2022-04-03 MED ORDER — APIXABAN 5 MG PO TABS
ORAL_TABLET | Freq: Two times a day (BID) | ORAL | 6 refills | Status: DC
  Filled 2022-04-03: qty 60, 30d supply, fill #0
  Filled 2022-05-18: qty 60, 30d supply, fill #1
  Filled 2022-07-09: qty 60, 30d supply, fill #2

## 2022-04-20 ENCOUNTER — Other Ambulatory Visit (HOSPITAL_BASED_OUTPATIENT_CLINIC_OR_DEPARTMENT_OTHER): Payer: Self-pay

## 2022-04-20 ENCOUNTER — Other Ambulatory Visit: Payer: Self-pay | Admitting: Hematology & Oncology

## 2022-04-20 DIAGNOSIS — C73 Malignant neoplasm of thyroid gland: Secondary | ICD-10-CM

## 2022-04-20 DIAGNOSIS — M62838 Other muscle spasm: Secondary | ICD-10-CM

## 2022-04-20 DIAGNOSIS — C787 Secondary malignant neoplasm of liver and intrahepatic bile duct: Secondary | ICD-10-CM

## 2022-04-20 DIAGNOSIS — R110 Nausea: Secondary | ICD-10-CM

## 2022-04-20 DIAGNOSIS — M546 Pain in thoracic spine: Secondary | ICD-10-CM

## 2022-04-20 MED ORDER — LORAZEPAM 1 MG PO TABS
1.0000 mg | ORAL_TABLET | Freq: Four times a day (QID) | ORAL | 0 refills | Status: DC | PRN
  Filled 2022-04-20: qty 30, 8d supply, fill #0
  Filled 2022-04-20: qty 14, 4d supply, fill #0
  Filled 2022-04-20: qty 16, 4d supply, fill #0

## 2022-04-20 MED ORDER — CYCLOBENZAPRINE HCL 10 MG PO TABS
10.0000 mg | ORAL_TABLET | Freq: Three times a day (TID) | ORAL | 1 refills | Status: DC | PRN
  Filled 2022-04-20: qty 30, 10d supply, fill #0
  Filled 2022-04-23 – 2022-05-18 (×2): qty 30, 10d supply, fill #1

## 2022-04-20 MED ORDER — PROMETHAZINE HCL 25 MG PO TABS
25.0000 mg | ORAL_TABLET | Freq: Four times a day (QID) | ORAL | 0 refills | Status: DC | PRN
  Filled 2022-04-20: qty 120, 30d supply, fill #0

## 2022-04-20 MED ORDER — HYDROMORPHONE HCL 8 MG PO TABS
8.0000 mg | ORAL_TABLET | ORAL | 0 refills | Status: DC | PRN
  Filled 2022-04-20: qty 120, 20d supply, fill #0

## 2022-04-21 ENCOUNTER — Inpatient Hospital Stay: Payer: BC Managed Care – PPO | Admitting: Hematology & Oncology

## 2022-04-21 ENCOUNTER — Encounter: Payer: Self-pay | Admitting: Hematology & Oncology

## 2022-04-21 ENCOUNTER — Inpatient Hospital Stay: Payer: BC Managed Care – PPO

## 2022-04-21 ENCOUNTER — Inpatient Hospital Stay: Payer: BC Managed Care – PPO | Attending: Hematology & Oncology

## 2022-04-21 VITALS — BP 128/81 | HR 89 | Resp 20

## 2022-04-21 VITALS — BP 124/75 | HR 91 | Temp 99.0°F | Resp 20 | Ht 70.0 in | Wt 271.0 lb

## 2022-04-21 DIAGNOSIS — I2699 Other pulmonary embolism without acute cor pulmonale: Secondary | ICD-10-CM | POA: Diagnosis not present

## 2022-04-21 DIAGNOSIS — E291 Testicular hypofunction: Secondary | ICD-10-CM | POA: Diagnosis not present

## 2022-04-21 DIAGNOSIS — C787 Secondary malignant neoplasm of liver and intrahepatic bile duct: Secondary | ICD-10-CM | POA: Insufficient documentation

## 2022-04-21 DIAGNOSIS — C73 Malignant neoplasm of thyroid gland: Secondary | ICD-10-CM

## 2022-04-21 DIAGNOSIS — Z79899 Other long term (current) drug therapy: Secondary | ICD-10-CM | POA: Diagnosis not present

## 2022-04-21 DIAGNOSIS — Z5111 Encounter for antineoplastic chemotherapy: Secondary | ICD-10-CM | POA: Diagnosis present

## 2022-04-21 DIAGNOSIS — D509 Iron deficiency anemia, unspecified: Secondary | ICD-10-CM | POA: Diagnosis not present

## 2022-04-21 DIAGNOSIS — I82439 Acute embolism and thrombosis of unspecified popliteal vein: Secondary | ICD-10-CM | POA: Insufficient documentation

## 2022-04-21 DIAGNOSIS — C799 Secondary malignant neoplasm of unspecified site: Secondary | ICD-10-CM

## 2022-04-21 LAB — FERRITIN: Ferritin: 1135 ng/mL — ABNORMAL HIGH (ref 24–336)

## 2022-04-21 LAB — CBC WITH DIFFERENTIAL (CANCER CENTER ONLY)
Abs Immature Granulocytes: 0.01 10*3/uL (ref 0.00–0.07)
Basophils Absolute: 0 10*3/uL (ref 0.0–0.1)
Basophils Relative: 1 %
Eosinophils Absolute: 0.1 10*3/uL (ref 0.0–0.5)
Eosinophils Relative: 2 %
HCT: 34.3 % — ABNORMAL LOW (ref 39.0–52.0)
Hemoglobin: 10.3 g/dL — ABNORMAL LOW (ref 13.0–17.0)
Immature Granulocytes: 0 %
Lymphocytes Relative: 12 %
Lymphs Abs: 0.3 10*3/uL — ABNORMAL LOW (ref 0.7–4.0)
MCH: 27.6 pg (ref 26.0–34.0)
MCHC: 30 g/dL (ref 30.0–36.0)
MCV: 92 fL (ref 80.0–100.0)
Monocytes Absolute: 0.7 10*3/uL (ref 0.1–1.0)
Monocytes Relative: 25 %
Neutro Abs: 1.7 10*3/uL (ref 1.7–7.7)
Neutrophils Relative %: 60 %
Platelet Count: 236 10*3/uL (ref 150–400)
RBC: 3.73 MIL/uL — ABNORMAL LOW (ref 4.22–5.81)
RDW: 19.9 % — ABNORMAL HIGH (ref 11.5–15.5)
WBC Count: 2.9 10*3/uL — ABNORMAL LOW (ref 4.0–10.5)
nRBC: 0 % (ref 0.0–0.2)

## 2022-04-21 LAB — CMP (CANCER CENTER ONLY)
ALT: 13 U/L (ref 0–44)
AST: 33 U/L (ref 15–41)
Albumin: 3.7 g/dL (ref 3.5–5.0)
Alkaline Phosphatase: 218 U/L — ABNORMAL HIGH (ref 38–126)
Anion gap: 8 (ref 5–15)
BUN: 13 mg/dL (ref 6–20)
CO2: 26 mmol/L (ref 22–32)
Calcium: 8.9 mg/dL (ref 8.9–10.3)
Chloride: 100 mmol/L (ref 98–111)
Creatinine: 0.89 mg/dL (ref 0.61–1.24)
GFR, Estimated: >60 mL/min (ref >60)
Glucose, Bld: 141 mg/dL — ABNORMAL HIGH (ref 70–99)
Potassium: 4.2 mmol/L (ref 3.5–5.1)
Sodium: 134 mmol/L — ABNORMAL LOW (ref 135–145)
Total Bilirubin: 0.8 mg/dL (ref 0.3–1.2)
Total Protein: 6.8 g/dL (ref 6.5–8.1)

## 2022-04-21 LAB — IRON AND IRON BINDING CAPACITY (CC-WL,HP ONLY)
Iron: 29 ug/dL — ABNORMAL LOW (ref 45–182)
Saturation Ratios: 14 % — ABNORMAL LOW (ref 17.9–39.5)
TIBC: 214 ug/dL — ABNORMAL LOW (ref 250–450)
UIBC: 185 ug/dL (ref 117–376)

## 2022-04-21 LAB — RETICULOCYTES
Immature Retic Fract: 37.5 % — ABNORMAL HIGH (ref 2.3–15.9)
RBC.: 3.74 MIL/uL — ABNORMAL LOW (ref 4.22–5.81)
Retic Count, Absolute: 68.1 10*3/uL (ref 19.0–186.0)
Retic Ct Pct: 1.8 % (ref 0.4–3.1)

## 2022-04-21 LAB — LACTATE DEHYDROGENASE: LDH: 518 U/L — ABNORMAL HIGH (ref 98–192)

## 2022-04-21 LAB — TSH: TSH: 37.737 u[IU]/mL — ABNORMAL HIGH (ref 0.350–4.500)

## 2022-04-21 MED ORDER — PALONOSETRON HCL INJECTION 0.25 MG/5ML
0.2500 mg | Freq: Once | INTRAVENOUS | Status: AC
  Administered 2022-04-21: 0.25 mg via INTRAVENOUS
  Filled 2022-04-21: qty 5

## 2022-04-21 MED ORDER — DEXTROSE 5 % IV SOLN: Freq: Once | INTRAVENOUS | Status: AC

## 2022-04-21 MED ORDER — LEUCOVORIN CALCIUM INJECTION 350 MG
400.0000 mg/m2 | Freq: Once | INTRAVENOUS | Status: AC
  Administered 2022-04-21: 964 mg via INTRAVENOUS
  Filled 2022-04-21: qty 48.2

## 2022-04-21 MED ORDER — SODIUM CHLORIDE 0.9 % IV SOLN
10.0000 mg | Freq: Once | INTRAVENOUS | Status: AC
  Administered 2022-04-21: 10 mg via INTRAVENOUS
  Filled 2022-04-21: qty 10

## 2022-04-21 MED ORDER — OXALIPLATIN CHEMO INJECTION 100 MG/20ML
67.0000 mg/m2 | Freq: Once | INTRAVENOUS | Status: AC
  Administered 2022-04-21: 150 mg via INTRAVENOUS
  Filled 2022-04-21: qty 20

## 2022-04-21 MED ORDER — LEUCOVORIN CALCIUM INJECTION 350 MG
400.0000 mg/m2 | Freq: Once | INTRAVENOUS | Status: DC
  Filled 2022-04-21: qty 48.2

## 2022-04-21 MED ORDER — SODIUM CHLORIDE 0.9 % IV SOLN
1900.0000 mg/m2 | INTRAVENOUS | Status: DC
  Administered 2022-04-21: 5000 mg via INTRAVENOUS
  Filled 2022-04-21: qty 100

## 2022-04-21 MED ORDER — TESTOSTERONE CYPIONATE 200 MG/ML IM SOLN
400.0000 mg | INTRAMUSCULAR | Status: DC
  Administered 2022-04-21: 400 mg via INTRAMUSCULAR
  Filled 2022-04-21: qty 2

## 2022-04-21 NOTE — Patient Instructions (Signed)

## 2022-04-21 NOTE — Patient Instructions (Signed)
[facility]  Discharge Instructions: Thank you for choosing [facility] to provide your oncology and hematology care.   If you have a lab appointment with the [facility], please go directly to the [facility] and check in at the registration area.  Wear comfortable clothing and clothing appropriate for easy access to any Portacath or PICC line.   We strive to give you quality time with your provider. You may need to reschedule your appointment if you arrive late (15 or more minutes).  Arriving late affects you and other patients whose appointments are after yours.  Also, if you miss three or more appointments without notifying the office, you may be dismissed from the clinic at the provider's discretion.      For prescription refill requests, have your pharmacy contact our office and allow 72 hours for refills to be completed.    Today you received the following chemotherapy and/or immunotherapy agents oxaliplatin,leucovorin,5-fu     To help prevent nausea and vomiting after your treatment, we encourage you to take your nausea medication as directed.  BELOW ARE SYMPTOMS THAT SHOULD BE REPORTED IMMEDIATELY: *FEVER GREATER THAN 100.4 F (38 C) OR HIGHER *CHILLS OR SWEATING *NAUSEA AND VOMITING THAT IS NOT CONTROLLED WITH YOUR NAUSEA MEDICATION *UNUSUAL SHORTNESS OF BREATH *UNUSUAL BRUISING OR BLEEDING *URINARY PROBLEMS (pain or burning when urinating, or frequent urination) *BOWEL PROBLEMS (unusual diarrhea, constipation, pain near the anus) TENDERNESS IN MOUTH AND THROAT WITH OR WITHOUT PRESENCE OF ULCERS (sore throat, sores in mouth, or a toothache) UNUSUAL RASH, SWELLING OR PAIN  UNUSUAL VAGINAL DISCHARGE OR ITCHING   Items with * indicate a potential emergency and should be followed up as soon as possible or go to the Emergency Department if any problems should occur.  Please show the CHEMOTHERAPY ALERT CARD or IMMUNOTHERAPY  ALERT CARD at check-in to the Emergency Department and triage nurse. Should you have questions after your visit or need to cancel or reschedule your appointment, please contact [facility]  [phone] and follow the prompts.  Office hours are 8:00 a.m. to 4:30 p.m. Monday - Friday. Please note that voicemails left after 4:00 p.m. may not be returned until the following business day.  We are closed weekends and major holidays. You have access to a nurse at all times for urgent questions. Please call the main number to the clinic [phone] and follow the prompts.  For any non-urgent questions, you may also contact your provider using MyChart. We now offer e-Visits for anyone 6 and older to request care online for non-urgent symptoms. For details visit [email or web address].   Also download the MyChart app! Go to the app store, search "MyChart", open the app, select [facility], and log in with your MyChart username and password.

## 2022-04-21 NOTE — Progress Notes (Signed)
Hematology and Oncology Follow Up Visit  Brian [name] [id number] [birth year] 47 y.o. 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 + Bilateral pulmonary emboli without right heart strain  DVT left popliteal vein    Past Therapy: Cabometyx 40 mg po q day -- start on 04/17/2019 - d/c on 07/17/2019 for progression Lenvima 24 mg po q day -- on hold due to proteinuria Taxotere/CDDP -- s/p cycle 4 - started on 09/13/2019 Adriamycin weekly 2 weeks on 1 week off - s/p cycle 8 --d/c on 04/16/2020 due to  progression Gavreto 400 mg po q day -- started on 05/17/2020  -- d/c on 06/16/2020 Pembrolizumab 200 mg IV q 3 week -- s/p cycle #8 - start on 06/26/2020 -- d/c on 01/16/2021 due to progression Vinblastine q week x 4 weeks on / 1 week off -- s/p  cycle #2 -- start on 02/24/2021 -- d/c on 05/20/2021 XRT for LEFT shoulder met -completed on 06/22/2021   Current Therapy:     FOLFOX -- s/p cycle #9 -- start on 07/08/2021 Zometa 4 mg IV q 2 months - next dose on 05/2022 Eliquis 5 mg po BID Testosterone 400 mg IM q 2 weeks   Interim History:  [name] is here today for follow-up and treatment.  He seems little bit lethargic today.  This may be from his pain medications.  We will just have to monitor this.  He does have some pain in the left hip.  He is getting around okay.  The pain seems to be worse when he bends down.  He has had no cough or shortness of breath.  He has had no problems with nausea or vomiting.  He has had no issues with bleeding.  He is on Eliquis.  We do give him testosterone injections.  When we last saw him, his testosterone was 186.  He has had no problems with rashes.  He has not noted any swollen lymph nodes.  He says that the pinna of his left ear is quite painful.  I looked at it.  There is a tiny little sore.  I told him to put some Neosporin on this.  Currently, I would have to say that his performance  status is probably ECOG 1.    Medications:  Allergies as of 04/21/2022       Reactions   Dextromethorphan-guaifenesin Other (See Comments)   Irregular heartbeat   Doxycycline Anaphylaxis, Nausea And Vomiting, Rash, Other (See Comments)   "heart arrythmia" and "dyspepsia" (only oral doxycycline causes reaction)   Gadobutrol Hives, Other (See Comments)   Patient had MRI scan at [place]. Patient called one hour after he left imaging facility to report two "blisters" that came up on "back" of lip.    Gadolinium Derivatives Hives   Patient had MRI scan at [place]. Patient called one hour after he left imaging facility to report two "blisters" that came up on "back" of lip.    Guaifenesin Palpitations      Ibuprofen Hives, Itching   Lisinopril Other (See Comments)   Angioedema   Pantoprazole Itching   Tramadol Hives, Itching   Nexavar [sorafenib] Hives   Barium Rash   Developed redness around neck after drinking 1st bottle of Barocat; pt was given Benedryl by ED Mds to be "on the safe side" before drinking 2nd bottle   Chlorhexidine Hives        Medication List  Accurate as of April 21, 2022 10:06 AM. If you have any questions, ask your nurse or doctor.          STOP taking these medications    azithromycin 250 MG tablet Commonly known as: Zithromax Z-Pak Stopped by: [name], MD       TAKE these medications    cyclobenzaprine 10 MG tablet Commonly known as: FLEXERIL Take 1 tablet (10 mg total) by mouth 3 (three) times daily as needed for muscle spasms.   diphenhydrAMINE 25 mg capsule Commonly known as: BENADRYL Take 2 capsules (50 mg total) by mouth once for 1 dose.   Eliquis 5 MG Tabs tablet Generic drug: apixaban TAKE 1 TABLET (5 MG TOTAL) BY MOUTH 2 (TWO) TIMES DAILY.   EPINEPHrine 0.3 mg/0.3 mL Soaj injection Commonly known as: EpiPen 2-Pak USE AS DIRECTED FOR LIFE THREATENING ALLERGIC REACTIONS   famotidine 40 MG  tablet Commonly known as: PEPCID Take 1 tablet (40 mg total) by mouth 2 (two) times daily.   Fusion Plus Caps Take 1 capsule by mouth daily.   HYDROmorphone 8 MG tablet Commonly known as: DILAUDID Take 1 tablet (8 mg total) by mouth every 4 (four) hours as needed for severe pain.   levothyroxine 200 MCG tablet Commonly known as: SYNTHROID Take one tablet (200 mcg dose) by mouth daily. Total:  288 mcg/day.   LORazepam 1 MG tablet Commonly known as: ATIVAN Take 1 tablet (1 mg total) by mouth every 6 (six) hours as needed for anxiety (for nausea and vomiting).   methylphenidate 10 MG tablet Commonly known as: RITALIN Take 1 tablet (10 mg total) by mouth 2 (two) times daily.   metoCLOPramide 10 MG tablet Commonly known as: REGLAN Take 1 tablet (10 mg total) by mouth every 6 (six) hours as needed for nausea or vomiting.   metolazone 5 MG tablet Commonly known as: ZAROXOLYN Take 5 mg by mouth daily.   metoprolol tartrate 50 MG tablet Commonly known as: Lopressor Take 1 tablet (50 mg total) by mouth 2 (two) times daily.   multivitamin capsule Take 1 capsule by mouth daily.   OLANZapine 10 MG tablet Commonly known as: ZYPREXA TAKE 1 TABLET (10 MG TOTAL) BY MOUTH AT BEDTIME.   ondansetron 8 MG tablet Commonly known as: ZOFRAN Take 8 mg by mouth every 8 (eight) hours as needed.   PEG 3350 17 GM/SCOOP Powd Mix 17 grams (1 Capful) in 8 ounces of liquid and drink by mouth daily as needed.   potassium chloride SA 20 MEQ tablet Commonly known as: KLOR-CON M Take 1 tablet (20 mEq total) by mouth daily.   prednisoLONE acetate 1 % ophthalmic suspension Commonly known as: PRED FORTE SMARTSIG:In Eye(s)   predniSONE 50 MG tablet Commonly known as: DELTASONE TAKE 1 TABLET BY MOUTH 13 HOURS BEFORE CT SCAN, 1 TABLET 7 HOURS BEFORE CT SCAN, AND 1 TABLET 1 HOUR BEFORE CT SCAN   prochlorperazine 10 MG tablet Commonly known as: COMPAZINE Take 1 tablet (10 mg total) by mouth every 6  (six) hours as needed (Nausea or vomiting).   promethazine 25 MG tablet Commonly known as: PHENERGAN Take 1 tablet (25 mg total) by mouth every 6 (six) hours as needed for nausea and vomiting.   senna-docusate 8.6-50 MG tablet Commonly known as: Senokot-S Take 1 tablet by mouth at bedtime as needed for mild constipation.   tamsulosin 0.4 MG Caps capsule Commonly known as: Flomax Take 1 capsule (0.4 mg total) by mouth daily after  supper.   temazepam 30 MG capsule Commonly known as: RESTORIL Take 1 capsule (30 mg total) by mouth at bedtime as needed for sleep.        Allergies:  Allergies  Allergen Reactions   Dextromethorphan-Guaifenesin Other (See Comments)    Irregular heartbeat    Doxycycline Anaphylaxis, Nausea And Vomiting, Rash and Other (See Comments)    "heart arrythmia" and "dyspepsia" (only oral doxycycline causes reaction)   Gadobutrol Hives and Other (See Comments)    Patient had MRI scan at [place]. Patient called one hour after he left imaging facility to report two "blisters" that came up on "back" of lip.     Gadolinium Derivatives Hives    Patient had MRI scan at [place]. Patient called one hour after he left imaging facility to report two "blisters" that came up on "back" of lip.    Guaifenesin Palpitations        Ibuprofen Hives and Itching   Lisinopril Other (See Comments)    Angioedema   Pantoprazole Itching   Tramadol Hives and Itching   Nexavar [Sorafenib] Hives   Barium Rash    Developed redness around neck after drinking 1st bottle of Barocat; pt was given Benedryl by ED Mds to be "on the safe side" before drinking 2nd bottle   Chlorhexidine Hives    Past Medical History, Surgical history, Social history, and Family History were reviewed and updated.  Review of Systems: Review of Systems  Constitutional:  Positive for weight loss.  HENT: Negative.    Eyes: Negative.   Respiratory: Negative.    Cardiovascular:   Positive for palpitations.  Gastrointestinal:  Positive for heartburn.  Genitourinary: Negative.   Musculoskeletal:  Positive for back pain, joint pain and myalgias.  Skin: Negative.   Neurological: Negative.   Endo/Heme/Allergies: Negative.   Psychiatric/Behavioral: Negative.       Physical Exam:  height is '5\' 10"'$  (1.778 m) and weight is 271 lb 0.6 oz (122.9 kg). His oral temperature is 99 F (37.2 C). His blood pressure is 124/75 and his pulse is 91. His respiration is 20 and oxygen saturation is 97%.   Wt Readings from Last 3 Encounters:  04/21/22 271 lb 0.6 oz (122.9 kg)  03/31/22 270 lb 4 oz (122.6 kg)  03/10/22 268 lb (121.6 kg)    Physical Exam Vitals reviewed.  HENT:     Head: Normocephalic and atraumatic.  Eyes:     Pupils: Pupils are equal, round, and reactive to light.  Cardiovascular:     Rate and Rhythm: Normal rate and regular rhythm.     Heart sounds: Normal heart sounds.  Pulmonary:     Effort: Pulmonary effort is normal.     Breath sounds: Normal breath sounds.  Abdominal:     General: Bowel sounds are normal.     Palpations: Abdomen is soft.  Musculoskeletal:        General: No tenderness or deformity. Normal range of motion.     Cervical back: Normal range of motion.  Lymphadenopathy:     Cervical: No cervical adenopathy.  Skin:    General: Skin is warm and dry.     Findings: No erythema or rash.  Neurological:     Mental Status: He is alert and oriented to person, place, and time.  Psychiatric:        Behavior: Behavior normal.        Thought Content: Thought content normal.        Judgment: Judgment  normal.     Lab Results  Component Value Date   WBC 2.9 (L) 04/21/2022   HGB 10.3 (L) 04/21/2022   HCT 34.3 (L) 04/21/2022   MCV 92.0 04/21/2022   PLT 236 04/21/2022   Lab Results  Component Value Date   FERRITIN 1,125 (H) 03/31/2022   IRON 21 (L) 03/31/2022   TIBC 234 (L) 03/31/2022   UIBC 213 03/31/2022   IRONPCTSAT 9 (L) 03/31/2022    Lab Results  Component Value Date   RETICCTPCT 1.8 04/21/2022   RBC 3.74 (L) 04/21/2022   No results found for: "KPAFRELGTCHN", "LAMBDASER", "KAPLAMBRATIO" No results found for: "IGGSERUM", "IGA", "IGMSERUM" No results found for: "TOTALPROTELP", "ALBUMINELP", "A1GS", "A2GS", "BETS", "BETA2SER", "GAMS", "MSPIKE", "SPEI"   Chemistry      Component Value Date/Time   NA 135 03/31/2022 0830   NA 141 02/06/2019 1210   K 3.8 03/31/2022 0830   CL 99 03/31/2022 0830   CO2 27 03/31/2022 0830   BUN 15 03/31/2022 0830   BUN 8 02/06/2019 1210   CREATININE 0.96 03/31/2022 0830      Component Value Date/Time   CALCIUM 9.1 03/31/2022 0830   ALKPHOS 175 (H) 03/31/2022 0830   AST 37 03/31/2022 0830   ALT 13 03/31/2022 0830   BILITOT 1.3 (H) 03/31/2022 0830       Impression and Plan: [name] is a very pleasant 47 yo caucasian gentleman with an 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     He has tolerated FOLFOX nicely so far.  Hopefully, we will still see a response from our next of scans.  I will set him up with scans after this 10 cycle of treatment.  We will plan to get him back to see [place] in 3 weeks.    [name], MD 3/5/202410:06 AM

## 2022-04-22 LAB — TESTOSTERONE: Testosterone: 258 ng/dL — ABNORMAL LOW (ref 264–916)

## 2022-04-23 ENCOUNTER — Inpatient Hospital Stay: Payer: BC Managed Care – PPO

## 2022-04-23 ENCOUNTER — Other Ambulatory Visit (HOSPITAL_BASED_OUTPATIENT_CLINIC_OR_DEPARTMENT_OTHER): Payer: Self-pay

## 2022-04-23 VITALS — BP 127/76 | HR 83 | Temp 98.7°F | Resp 18

## 2022-04-23 DIAGNOSIS — E291 Testicular hypofunction: Secondary | ICD-10-CM

## 2022-04-23 DIAGNOSIS — C73 Malignant neoplasm of thyroid gland: Secondary | ICD-10-CM

## 2022-04-23 DIAGNOSIS — D509 Iron deficiency anemia, unspecified: Secondary | ICD-10-CM

## 2022-04-23 MED ORDER — SODIUM CHLORIDE 0.9 % IV SOLN
200.0000 mg | Freq: Once | INTRAVENOUS | Status: AC
  Administered 2022-04-23: 200 mg via INTRAVENOUS
  Filled 2022-04-23: qty 200

## 2022-04-23 MED ORDER — SODIUM CHLORIDE 0.9 % IV SOLN: Freq: Once | INTRAVENOUS | Status: AC

## 2022-04-23 MED ORDER — HEPARIN SOD (PORK) LOCK FLUSH 100 UNIT/ML IV SOLN
500.0000 [IU] | Freq: Once | INTRAVENOUS | Status: AC | PRN
  Administered 2022-04-23: 500 [IU]

## 2022-04-23 MED ORDER — SODIUM CHLORIDE 0.9% FLUSH
10.0000 mL | INTRAVENOUS | Status: DC | PRN
  Administered 2022-04-23: 10 mL

## 2022-04-23 NOTE — Progress Notes (Signed)
Patient declined to stay for the post infusion observation period. Patient denies any difficulty with this infusion in the past and is aware to call with any questions or concerns.   Pt verbalized understanding and had no further questions today.   

## 2022-04-23 NOTE — Patient Instructions (Signed)

## 2022-05-05 ENCOUNTER — Inpatient Hospital Stay: Payer: BC Managed Care – PPO

## 2022-05-05 VITALS — BP 110/89 | HR 94 | Temp 98.2°F | Resp 18

## 2022-05-05 DIAGNOSIS — C799 Secondary malignant neoplasm of unspecified site: Secondary | ICD-10-CM

## 2022-05-05 DIAGNOSIS — E291 Testicular hypofunction: Secondary | ICD-10-CM

## 2022-05-05 DIAGNOSIS — C73 Malignant neoplasm of thyroid gland: Secondary | ICD-10-CM

## 2022-05-05 DIAGNOSIS — D509 Iron deficiency anemia, unspecified: Secondary | ICD-10-CM

## 2022-05-05 MED ORDER — TESTOSTERONE CYPIONATE 200 MG/ML IM SOLN
400.0000 mg | INTRAMUSCULAR | Status: DC
  Administered 2022-05-05: 400 mg via INTRAMUSCULAR
  Filled 2022-05-05: qty 2

## 2022-05-05 NOTE — Patient Instructions (Signed)
Testosterone Injection What is this medication? TESTOSTERONE (tes TOS ter one) is used to increase testosterone levels in your body. It belongs to a group of medications called androgen hormones. This medicine may be used for other purposes; ask your health care provider or pharmacist if you have questions. COMMON BRAND NAME(S): Andro-L.A., Aveed, Delatestryl, Depo-Testosterone, Virilon What should I tell my care team before I take this medication? They need to know if you have any of these conditions: Cancer Diabetes Heart disease Kidney disease Liver disease Lung disease Prostate disease An unusual or allergic reaction to testosterone, other medications, foods, dyes, or preservatives If a male partner is pregnant or trying to get pregnant Breast-feeding How should I use this medication? This medication is for injection into a muscle. It is usually given in a hospital or clinic. A special MedGuide will be given to you by the pharmacist with each prescription and refill. Be sure to read this information carefully each time. Contact your care team regarding the use of this medication in children. While this medication may be prescribed for children as young as 12 years of age for selected conditions, precautions do apply. Overdosage: If you think you have taken too much of this medicine contact a poison control center or emergency room at once. NOTE: This medicine is only for you. Do not share this medicine with others. What if I miss a dose? Try not to miss a dose. Your care team will tell you when your next injection is due. Notify the office if you are unable to keep an appointment. What may interact with this medication? Medications for diabetes Medications that treat or prevent blood clots like warfarin Oxyphenbutazone Propranolol Steroid medications like prednisone or cortisone This list may not describe all possible interactions. Give your health care provider a list of all the  medicines, herbs, non-prescription drugs, or dietary supplements you use. Also tell them if you smoke, drink alcohol, or use illegal drugs. Some items may interact with your medicine. What should I watch for while using this medication? Visit your care team for regular checks on your progress. They will need to check the level of testosterone in your blood. This medication is only approved for use in men who have low levels of testosterone related to certain medical conditions. Heart attacks and strokes have been reported with the use of this medication. Notify your care team and seek emergency treatment if you develop breathing problems; changes in vision; confusion; chest pain or chest tightness; sudden arm pain; severe, sudden headache; trouble speaking or understanding; sudden numbness or weakness of the face, arm or leg; loss of balance or coordination. Talk to your care team about the risks and benefits of this medication. This medication may affect blood sugar levels. If you have diabetes, check with your care team before you change your diet or the dose of your diabetic medication. Testosterone injections are not commonly used in women. Women should inform their care team if they wish to become pregnant or think they might be pregnant. There is a potential for serious side effects to an unborn child. Talk to your care team or pharmacist for more information. Talk with your care team about your birth control options while taking this medication. This medication is banned from use in athletes by most athletic organizations. What side effects may I notice from receiving this medication? Side effects that you should report to your care team as soon as possible: Allergic reactions--skin rash, itching, hives, swelling of the   face, lips, tongue, or throat Blood clot--pain, swelling, or warmth in the leg, shortness of breath, chest pain Heart attack--pain or tightness in the chest, shoulders, arms or jaw,  nausea, shortness of breath, cold or clammy skin, feeling faint or lightheaded Increase in blood pressure Liver injury--right upper belly pain, loss of appetite, nausea, light-colored stool, dark yellow or brown urine, yellowing of the skin or eyes, unusual weakness or fatigue Mood swings, irritability, or hostility Prolonged or painful erection Sleep apnea--loud snoring, gasping during sleep, daytime sleepiness Stroke--sudden numbness or weakness of the face, arm or leg, trouble speaking, confusion, trouble walking, loss of balance or coordination, dizziness, severe headache, change in vision Swelling of the ankles, hands, or feet Thoughts of suicide or self-harm, worsening mood, feelings of depression Side effects that usually do not require medical attention (report to your care team if they continue or are bothersome): Acne Change in sex drive or performance Pain, redness, or irritation at the application site Unexpected breast tissue growth This list may not describe all possible side effects. Call your doctor for medical advice about side effects. You may report side effects to FDA at 1-800-FDA-1088. Where should I keep my medication? Keep out of the reach of children. This medication can be abused. Keep your medication in a safe place to protect it from theft. Do not share this medication with anyone. Selling or giving away this medication is dangerous and against the law. Store at room temperature between 20 and 25 degrees C (68 and 77 degrees F). Do not freeze. Protect from light. Follow the directions for the product you are prescribed. Throw away any unused medication after the expiration date. NOTE: This sheet is a summary. It may not cover all possible information. If you have questions about this medicine, talk to your doctor, pharmacist, or health care provider.  2023 Elsevier/Gold Standard (2020-01-30 00:00:00)  

## 2022-05-11 ENCOUNTER — Ambulatory Visit (HOSPITAL_COMMUNITY)
Admission: RE | Admit: 2022-05-11 | Discharge: 2022-05-11 | Disposition: A | Discharge status: Home / Self Care | Payer: BC Managed Care – PPO | Source: Ambulatory Visit | Attending: Hematology & Oncology | Admitting: Hematology & Oncology

## 2022-05-11 DIAGNOSIS — C73 Malignant neoplasm of thyroid gland: Secondary | ICD-10-CM | POA: Diagnosis present

## 2022-05-11 LAB — GLUCOSE, CAPILLARY: Glucose-Capillary: 154 mg/dL — ABNORMAL HIGH (ref 70–99)

## 2022-05-11 MED ORDER — GADOBUTROL 1 MMOL/ML IV SOLN
10.0000 mL | Freq: Once | INTRAVENOUS | Status: AC | PRN
  Administered 2022-05-11: 10 mL via INTRAVENOUS

## 2022-05-11 MED ORDER — FLUDEOXYGLUCOSE F - 18 (FDG) INJECTION
13.5000 | Freq: Once | INTRAVENOUS | Status: AC
  Administered 2022-05-11: 13.5 via INTRAVENOUS

## 2022-05-12 ENCOUNTER — Inpatient Hospital Stay: Payer: BC Managed Care – PPO

## 2022-05-12 ENCOUNTER — Encounter: Payer: Self-pay | Admitting: Hematology & Oncology

## 2022-05-12 ENCOUNTER — Other Ambulatory Visit: Payer: Self-pay

## 2022-05-12 ENCOUNTER — Other Ambulatory Visit (HOSPITAL_BASED_OUTPATIENT_CLINIC_OR_DEPARTMENT_OTHER): Payer: Self-pay

## 2022-05-12 ENCOUNTER — Inpatient Hospital Stay: Payer: BC Managed Care – PPO | Admitting: Hematology & Oncology

## 2022-05-12 VITALS — BP 116/83 | HR 73 | Temp 97.7°F | Resp 19 | Ht 70.0 in | Wt 263.0 lb

## 2022-05-12 DIAGNOSIS — C73 Malignant neoplasm of thyroid gland: Secondary | ICD-10-CM

## 2022-05-12 DIAGNOSIS — C799 Secondary malignant neoplasm of unspecified site: Secondary | ICD-10-CM

## 2022-05-12 LAB — CMP (CANCER CENTER ONLY)
ALT: 14 U/L (ref 0–44)
AST: 29 U/L (ref 15–41)
Albumin: 3.9 g/dL (ref 3.5–5.0)
Alkaline Phosphatase: 211 U/L — ABNORMAL HIGH (ref 38–126)
Anion gap: 10 (ref 5–15)
BUN: 16 mg/dL (ref 6–20)
CO2: 29 mmol/L (ref 22–32)
Calcium: 9.2 mg/dL (ref 8.9–10.3)
Chloride: 97 mmol/L — ABNORMAL LOW (ref 98–111)
Creatinine: 0.94 mg/dL (ref 0.61–1.24)
GFR, Estimated: >60 mL/min (ref >60)
Glucose, Bld: 119 mg/dL — ABNORMAL HIGH (ref 70–99)
Potassium: 3.8 mmol/L (ref 3.5–5.1)
Sodium: 136 mmol/L (ref 135–145)
Total Bilirubin: 0.7 mg/dL (ref 0.3–1.2)
Total Protein: 6.8 g/dL (ref 6.5–8.1)

## 2022-05-12 LAB — CBC WITH DIFFERENTIAL (CANCER CENTER ONLY)
Abs Immature Granulocytes: 0.04 10*3/uL (ref 0.00–0.07)
Basophils Absolute: 0 10*3/uL (ref 0.0–0.1)
Basophils Relative: 0 %
Eosinophils Absolute: 0 10*3/uL (ref 0.0–0.5)
Eosinophils Relative: 0 %
HCT: 37.6 % — ABNORMAL LOW (ref 39.0–52.0)
Hemoglobin: 11.2 g/dL — ABNORMAL LOW (ref 13.0–17.0)
Immature Granulocytes: 1 %
Lymphocytes Relative: 4 %
Lymphs Abs: 0.2 10*3/uL — ABNORMAL LOW (ref 0.7–4.0)
MCH: 27.6 pg (ref 26.0–34.0)
MCHC: 29.8 g/dL — ABNORMAL LOW (ref 30.0–36.0)
MCV: 92.6 fL (ref 80.0–100.0)
Monocytes Absolute: 0.9 10*3/uL (ref 0.1–1.0)
Monocytes Relative: 19 %
Neutro Abs: 3.6 10*3/uL (ref 1.7–7.7)
Neutrophils Relative %: 76 %
Platelet Count: 285 10*3/uL (ref 150–400)
RBC: 4.06 MIL/uL — ABNORMAL LOW (ref 4.22–5.81)
RDW: 21.6 % — ABNORMAL HIGH (ref 11.5–15.5)
WBC Count: 4.8 10*3/uL (ref 4.0–10.5)
nRBC: 0 % (ref 0.0–0.2)

## 2022-05-12 LAB — RETICULOCYTES
Immature Retic Fract: 25.8 % — ABNORMAL HIGH (ref 2.3–15.9)
RBC.: 4.05 MIL/uL — ABNORMAL LOW (ref 4.22–5.81)
Retic Count, Absolute: 74.9 10*3/uL (ref 19.0–186.0)
Retic Ct Pct: 1.9 % (ref 0.4–3.1)

## 2022-05-12 LAB — IRON AND IRON BINDING CAPACITY (CC-WL,HP ONLY)
Iron: 54 ug/dL (ref 45–182)
Saturation Ratios: 23 % (ref 17.9–39.5)
TIBC: 237 ug/dL — ABNORMAL LOW (ref 250–450)
UIBC: 183 ug/dL (ref 117–376)

## 2022-05-12 LAB — TSH: TSH: 30.259 u[IU]/mL — ABNORMAL HIGH (ref 0.350–4.500)

## 2022-05-12 LAB — FERRITIN: Ferritin: 1375 ng/mL — ABNORMAL HIGH (ref 24–336)

## 2022-05-12 LAB — LACTATE DEHYDROGENASE: LDH: 432 U/L — ABNORMAL HIGH (ref 98–192)

## 2022-05-12 MED ORDER — SODIUM CHLORIDE 0.9% FLUSH
10.0000 mL | Freq: Once | INTRAVENOUS | Status: AC
  Administered 2022-05-12: 10 mL

## 2022-05-12 MED ORDER — TEMAZEPAM 30 MG PO CAPS
30.0000 mg | ORAL_CAPSULE | Freq: Every evening | ORAL | 0 refills | Status: DC | PRN
  Filled 2022-05-12: qty 30, 30d supply, fill #0

## 2022-05-12 MED ORDER — HEPARIN SOD (PORK) LOCK FLUSH 100 UNIT/ML IV SOLN
500.0000 [IU] | Freq: Once | INTRAVENOUS | Status: AC
  Administered 2022-05-12: 500 [IU] via INTRAVENOUS

## 2022-05-12 NOTE — Patient Instructions (Signed)

## 2022-05-12 NOTE — Progress Notes (Signed)
Hematology and Oncology Follow Up Visit  Brian [name] PC:[id number] 1975/06/13 47 y.o. 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 + Bilateral pulmonary emboli without right heart strain  DVT left popliteal vein    Past Therapy: Cabometyx 40 mg po q day -- start on 04/17/2019 - d/c on 07/17/2019 for progression Lenvima 24 mg po q day -- on hold due to proteinuria Taxotere/CDDP -- s/p cycle 4 - started on 09/13/2019 Adriamycin weekly 2 weeks on 1 week off - s/p cycle 8 --d/c on 04/16/2020 due to  progression Gavreto 400 mg po q day -- started on 05/17/2020  -- d/c on 06/16/2020 Pembrolizumab 200 mg IV q 3 week -- s/p cycle #8 - start on 06/26/2020 -- d/c on 01/16/2021 due to progression Vinblastine q week x 4 weeks on / 1 week off -- s/p  cycle #2 -- start on 02/24/2021 -- d/c on 05/20/2021 XRT for LEFT shoulder met -completed on 06/22/2021   Current Therapy:     FOLFOX -- s/p cycle #10 -- start on 07/08/2021 -- d/c on 05/12/2022 CDDP/Adria/Cytoxan -- start cycle # 1 on 05/19/2022 Zometa 4 mg IV q 2 months - next dose on 05/2022 Eliquis 5 mg po BID Testosterone 400 mg IM q 2 weeks   Interim History:  [name] is here today for follow-up and treatment.  Unfortunately, we did do an MRI and a PET scan.  These were done on 05/11/2022.  It did show that he does have progressive disease now.  The MRI of the liver did show progression.  The PET scan seem to show some mixed findings but overall I think that he does show progression.  I must say that we have gotten a lot of "mileage out of the FOLFOX.  However, I think is time to make a change.  He is feeling okay.  He still has a good performance status.  He is still active.  I do believe that we can try another course of chemotherapy on him.  I think would be reasonable to try him on cisplatin/Adriamycin/Cytoxan.  I think this would be a reasonable way to try to help.  I realize  this does have some toxicity.  We will have to check a echocardiogram on him to make sure his heart is doing all right.  He has had no problems with the testosterone.  This does help his energy level.  He has had no bleeding.  He has had no change in bowel or bladder habits.  He has had no rashes.  There may be little bit of leg swelling.  He has had no cough.  He has had no hoarseness.  Overall, I would say that his performance status is probably ECOG 1.     Medications:  Allergies as of 05/12/2022       Reactions   Dextromethorphan-guaifenesin Other (See Comments)   Irregular heartbeat   Doxycycline Anaphylaxis, Nausea And Vomiting, Rash, Other (See Comments)   "heart arrythmia" and "dyspepsia" (only oral doxycycline causes reaction)   Gadobutrol Hives, Other (See Comments)   Patient had MRI scan at [facility]. Patient called one hour after he left imaging facility to report two "blisters" that came up on "back" of lip.   Gadolinium Derivatives Hives   Patient had MRI scan at [facility]. Patient called one hour after he left imaging facility to report two "blisters" that came up on "back" of  lip.    Guaifenesin Palpitations      Ibuprofen Hives, Itching   Lisinopril Other (See Comments)   Angioedema   Pantoprazole Itching   Tramadol Hives, Itching   Nexavar [sorafenib] Hives   Barium Rash   Developed redness around neck after drinking 1st bottle of Barocat; pt was given Benedryl by ED Mds to be "on the safe side" before drinking 2nd bottle   Chlorhexidine Hives        Medication List        Accurate as of May 12, 2022  1:20 PM. If you have any questions, ask your nurse or doctor.          cyclobenzaprine 10 MG tablet Commonly known as: FLEXERIL Take 1 tablet (10 mg total) by mouth 3 (three) times daily as needed for muscle spasms.   diphenhydrAMINE 25 mg capsule Commonly known as: BENADRYL Take 2 capsules (50 mg total) by mouth once for 1 dose.    Eliquis 5 MG Tabs tablet Generic drug: apixaban TAKE 1 TABLET (5 MG TOTAL) BY MOUTH 2 (TWO) TIMES DAILY.   EPINEPHrine 0.3 mg/0.3 mL Soaj injection Commonly known as: EpiPen 2-Pak USE AS DIRECTED FOR LIFE THREATENING ALLERGIC REACTIONS   famotidine 40 MG tablet Commonly known as: PEPCID Take 1 tablet (40 mg total) by mouth 2 (two) times daily.   Fusion Plus Caps Take 1 capsule by mouth daily.   HYDROmorphone 8 MG tablet Commonly known as: DILAUDID Take 1 tablet (8 mg total) by mouth every 4 (four) hours as needed for severe pain.   levothyroxine 200 MCG tablet Commonly known as: SYNTHROID Take one tablet (200 mcg dose) by mouth daily. Total:  288 mcg/day.   LORazepam 1 MG tablet Commonly known as: ATIVAN Take 1 tablet (1 mg total) by mouth every 6 (six) hours as needed for anxiety (for nausea and vomiting).   methylphenidate 10 MG tablet Commonly known as: RITALIN Take 1 tablet (10 mg total) by mouth 2 (two) times daily.   metoCLOPramide 10 MG tablet Commonly known as: REGLAN Take 1 tablet (10 mg total) by mouth every 6 (six) hours as needed for nausea or vomiting.   metolazone 5 MG tablet Commonly known as: ZAROXOLYN Take 5 mg by mouth daily.   metoprolol tartrate 50 MG tablet Commonly known as: Lopressor Take 1 tablet (50 mg total) by mouth 2 (two) times daily.   multivitamin capsule Take 1 capsule by mouth daily.   OLANZapine 10 MG tablet Commonly known as: ZYPREXA TAKE 1 TABLET (10 MG TOTAL) BY MOUTH AT BEDTIME.   ondansetron 8 MG tablet Commonly known as: ZOFRAN Take 8 mg by mouth every 8 (eight) hours as needed.   PEG 3350 17 GM/SCOOP Powd Mix 17 grams (1 Capful) in 8 ounces of liquid and drink by mouth daily as needed.   potassium chloride SA 20 MEQ tablet Commonly known as: KLOR-CON M Take 1 tablet (20 mEq total) by mouth daily.   prednisoLONE acetate 1 % ophthalmic suspension Commonly known as: PRED FORTE SMARTSIG:In Eye(s)   predniSONE 50  MG tablet Commonly known as: DELTASONE TAKE 1 TABLET BY MOUTH 13 HOURS BEFORE CT SCAN, 1 TABLET 7 HOURS BEFORE CT SCAN, AND 1 TABLET 1 HOUR BEFORE CT SCAN   prochlorperazine 10 MG tablet Commonly known as: COMPAZINE Take 1 tablet (10 mg total) by mouth every 6 (six) hours as needed (Nausea or vomiting).   promethazine 25 MG tablet Commonly known as: PHENERGAN Take 1 tablet (25  mg total) by mouth every 6 (six) hours as needed for nausea and vomiting.   senna-docusate 8.6-50 MG tablet Commonly known as: Senokot-S Take 1 tablet by mouth at bedtime as needed for mild constipation.   tamsulosin 0.4 MG Caps capsule Commonly known as: Flomax Take 1 capsule (0.4 mg total) by mouth daily after supper.   temazepam 30 MG capsule Commonly known as: RESTORIL Take 1 capsule (30 mg total) by mouth at bedtime as needed for sleep.        Allergies:  Allergies  Allergen Reactions   Dextromethorphan-Guaifenesin Other (See Comments)    Irregular heartbeat    Doxycycline Anaphylaxis, Nausea And Vomiting, Rash and Other (See Comments)    "heart arrythmia" and "dyspepsia" (only oral doxycycline causes reaction)   Gadobutrol Hives and Other (See Comments)    Patient had MRI scan at [place]. Patient called one hour after he left imaging facility to report two "blisters" that came up on "back" of lip.   Gadolinium Derivatives Hives    Patient had MRI scan at [place]. Patient called one hour after he left imaging facility to report two "blisters" that came up on "back" of lip.    Guaifenesin Palpitations        Ibuprofen Hives and Itching   Lisinopril Other (See Comments)    Angioedema   Pantoprazole Itching   Tramadol Hives and Itching   Nexavar [Sorafenib] Hives   Barium Rash    Developed redness around neck after drinking 1st bottle of Barocat; pt was given Benedryl by ED Mds to be "on the safe side" before drinking 2nd bottle   Chlorhexidine Hives    Past Medical  History, Surgical history, Social history, and Family History were reviewed and updated.  Review of Systems: Review of Systems  Constitutional:  Positive for weight loss.  HENT: Negative.    Eyes: Negative.   Respiratory: Negative.    Cardiovascular:  Positive for palpitations.  Gastrointestinal:  Positive for heartburn.  Genitourinary: Negative.   Musculoskeletal:  Positive for back pain, joint pain and myalgias.  Skin: Negative.   Neurological: Negative.   Endo/Heme/Allergies: Negative.   Psychiatric/Behavioral: Negative.       Physical Exam:  height is 5\' 10"  (1.778 m) and weight is 263 lb (119.3 kg). His oral temperature is 97.7 F (36.5 C). His blood pressure is 116/83 and his pulse is 73. His respiration is 19 and oxygen saturation is 98%.   Wt Readings from Last 3 Encounters:  05/12/22 263 lb (119.3 kg)  04/21/22 271 lb 0.6 oz (122.9 kg)  03/31/22 270 lb 4 oz (122.6 kg)    Physical Exam Vitals reviewed.  HENT:     Head: Normocephalic and atraumatic.  Eyes:     Pupils: Pupils are equal, round, and reactive to light.  Cardiovascular:     Rate and Rhythm: Normal rate and regular rhythm.     Heart sounds: Normal heart sounds.  Pulmonary:     Effort: Pulmonary effort is normal.     Breath sounds: Normal breath sounds.  Abdominal:     General: Bowel sounds are normal.     Palpations: Abdomen is soft.  Musculoskeletal:        General: No tenderness or deformity. Normal range of motion.     Cervical back: Normal range of motion.  Lymphadenopathy:     Cervical: No cervical adenopathy.  Skin:    General: Skin is warm and dry.     Findings: No erythema  or rash.  Neurological:     Mental Status: He is alert and oriented to person, place, and time.  Psychiatric:        Behavior: Behavior normal.        Thought Content: Thought content normal.        Judgment: Judgment normal.      Lab Results  Component Value Date   WBC 4.8 05/12/2022   HGB 11.2 (L)  05/12/2022   HCT 37.6 (L) 05/12/2022   MCV 92.6 05/12/2022   PLT 285 05/12/2022   Lab Results  Component Value Date   FERRITIN 1,135 (H) 04/21/2022   IRON 54 05/12/2022   TIBC 237 (L) 05/12/2022   UIBC 183 05/12/2022   IRONPCTSAT 23 05/12/2022   Lab Results  Component Value Date   RETICCTPCT 1.9 05/12/2022   RBC 4.06 (L) 05/12/2022   RBC 4.05 (L) 05/12/2022   No results found for: "KPAFRELGTCHN", "LAMBDASER", "KAPLAMBRATIO" No results found for: "IGGSERUM", "IGA", "IGMSERUM" No results found for: "TOTALPROTELP", "ALBUMINELP", "A1GS", "A2GS", "BETS", "BETA2SER", "GAMS", "MSPIKE", "SPEI"   Chemistry      Component Value Date/Time   NA 136 05/12/2022 0905   NA 141 02/06/2019 1210   K 3.8 05/12/2022 0905   CL 97 (L) 05/12/2022 0905   CO2 29 05/12/2022 0905   BUN 16 05/12/2022 0905   BUN 8 02/06/2019 1210   CREATININE 0.94 05/12/2022 0905      Component Value Date/Time   CALCIUM 9.2 05/12/2022 0905   ALKPHOS 211 (H) 05/12/2022 0905   AST 29 05/12/2022 0905   ALT 14 05/12/2022 0905   BILITOT 0.7 05/12/2022 0905       Impression and Plan: [name] is a very pleasant 47 yo caucasian gentleman with an 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    Unfortunately, will get have to make a change that with his protocol.  I just think we are going to have to try something different.  Again I think that the CAP protocol would be a reasonable way to go.  This is useful for salivary gland cancer.  Since the thyroid is a gland, I figure that we might be able to get a response.  He has not had these medicines as of yet.  We will see about getting an echocardiogram on him this week.  I talked him about this.  I told that he will lose his hair.  He could certainly have more in the way of nausea and vomiting.  I probably would give him 2 cycles of treatment and then repeat our scans.  Again, he is done incredibly well.  We got some when she is out of the FOLFOX  regimen.  I am just glad that he had a decent quality of life with FOLFOX.  Hopefully, he will have a good quality of life with CAP.  We will start next week.  I will see him back when he has his second cycle of treatment in May.     [name], MD 3/26/20241:20 PM

## 2022-05-12 NOTE — Addendum Note (Signed)
Addended by: Perlie Gold on: 05/12/2022 10:42 AM   Modules accepted: Orders

## 2022-05-13 LAB — TESTOSTERONE: Testosterone: 877 ng/dL (ref 264–916)

## 2022-05-13 NOTE — Progress Notes (Signed)
Okay to start CAP regimen on 05/19/22 prior to ECHO on 4/10 per Dr. Marin Olp.   Pharmacist Chemotherapy Monitoring - Initial Assessment    Anticipated start date: 05/19/22   The following has been reviewed per standard work regarding the patient's treatment regimen: The patient's diagnosis, treatment plan and drug doses, and organ/hematologic function Lab orders and baseline tests specific to treatment regimen  The treatment plan start date, drug sequencing, and pre-medications Prior authorization status  Patient's documented medication list, including drug-drug interaction screen and prescriptions for anti-emetics and supportive care specific to the treatment regimen The drug concentrations, fluid compatibility, administration routes, and timing of the medications to be used The patient's access for treatment and lifetime cumulative dose history, if applicable  The patient's medication allergies and previous infusion related reactions, if applicable   Changes made to treatment plan:  N/A  Follow up needed:  Pending authorization for treatment    Brian [name], Brian [name], [name], 05/13/2022  3:31 PM

## 2022-05-14 ENCOUNTER — Inpatient Hospital Stay: Payer: BC Managed Care – PPO

## 2022-05-18 ENCOUNTER — Other Ambulatory Visit (HOSPITAL_BASED_OUTPATIENT_CLINIC_OR_DEPARTMENT_OTHER): Payer: Self-pay

## 2022-05-18 ENCOUNTER — Other Ambulatory Visit: Payer: Self-pay | Admitting: Hematology & Oncology

## 2022-05-18 DIAGNOSIS — C787 Secondary malignant neoplasm of liver and intrahepatic bile duct: Secondary | ICD-10-CM

## 2022-05-18 DIAGNOSIS — M546 Pain in thoracic spine: Secondary | ICD-10-CM

## 2022-05-18 DIAGNOSIS — C799 Secondary malignant neoplasm of unspecified site: Secondary | ICD-10-CM

## 2022-05-18 DIAGNOSIS — R110 Nausea: Secondary | ICD-10-CM

## 2022-05-18 DIAGNOSIS — C73 Malignant neoplasm of thyroid gland: Secondary | ICD-10-CM

## 2022-05-18 MED ORDER — PROMETHAZINE HCL 25 MG PO TABS
25.0000 mg | ORAL_TABLET | Freq: Four times a day (QID) | ORAL | 0 refills | Status: DC | PRN
  Filled 2022-05-18: qty 120, 30d supply, fill #0

## 2022-05-18 MED ORDER — HYDROMORPHONE HCL 8 MG PO TABS
8.0000 mg | ORAL_TABLET | ORAL | 0 refills | Status: DC | PRN
  Filled 2022-05-18: qty 120, 20d supply, fill #0

## 2022-05-18 MED ORDER — LORAZEPAM 1 MG PO TABS
1.0000 mg | ORAL_TABLET | Freq: Four times a day (QID) | ORAL | 0 refills | Status: DC | PRN
  Filled 2022-05-18: qty 30, 8d supply, fill #0

## 2022-05-19 ENCOUNTER — Encounter: Payer: Self-pay | Admitting: Family

## 2022-05-19 ENCOUNTER — Inpatient Hospital Stay: Payer: BC Managed Care – PPO | Attending: Hematology & Oncology

## 2022-05-19 ENCOUNTER — Other Ambulatory Visit (HOSPITAL_BASED_OUTPATIENT_CLINIC_OR_DEPARTMENT_OTHER): Payer: Self-pay

## 2022-05-19 ENCOUNTER — Encounter: Payer: Self-pay | Admitting: Hematology & Oncology

## 2022-05-19 ENCOUNTER — Inpatient Hospital Stay: Payer: BC Managed Care – PPO

## 2022-05-19 VITALS — BP 119/85 | HR 82 | Temp 98.3°F | Resp 20

## 2022-05-19 DIAGNOSIS — D6481 Anemia due to antineoplastic chemotherapy: Secondary | ICD-10-CM | POA: Insufficient documentation

## 2022-05-19 DIAGNOSIS — E291 Testicular hypofunction: Secondary | ICD-10-CM

## 2022-05-19 DIAGNOSIS — C73 Malignant neoplasm of thyroid gland: Secondary | ICD-10-CM

## 2022-05-19 DIAGNOSIS — D701 Agranulocytosis secondary to cancer chemotherapy: Secondary | ICD-10-CM | POA: Insufficient documentation

## 2022-05-19 DIAGNOSIS — D509 Iron deficiency anemia, unspecified: Secondary | ICD-10-CM

## 2022-05-19 DIAGNOSIS — C787 Secondary malignant neoplasm of liver and intrahepatic bile duct: Secondary | ICD-10-CM | POA: Diagnosis present

## 2022-05-19 DIAGNOSIS — Z5111 Encounter for antineoplastic chemotherapy: Secondary | ICD-10-CM | POA: Diagnosis present

## 2022-05-19 DIAGNOSIS — T451X5A Adverse effect of antineoplastic and immunosuppressive drugs, initial encounter: Secondary | ICD-10-CM | POA: Diagnosis not present

## 2022-05-19 DIAGNOSIS — C799 Secondary malignant neoplasm of unspecified site: Secondary | ICD-10-CM

## 2022-05-19 LAB — CBC WITH DIFFERENTIAL (CANCER CENTER ONLY)
Abs Immature Granulocytes: 0.05 10*3/uL (ref 0.00–0.07)
Basophils Absolute: 0.1 10*3/uL (ref 0.0–0.1)
Basophils Relative: 1 %
Eosinophils Absolute: 0 10*3/uL (ref 0.0–0.5)
Eosinophils Relative: 0 %
HCT: 39.2 % (ref 39.0–52.0)
Hemoglobin: 12.3 g/dL — ABNORMAL LOW (ref 13.0–17.0)
Immature Granulocytes: 1 %
Lymphocytes Relative: 4 %
Lymphs Abs: 0.4 10*3/uL — ABNORMAL LOW (ref 0.7–4.0)
MCH: 28.1 pg (ref 26.0–34.0)
MCHC: 31.4 g/dL (ref 30.0–36.0)
MCV: 89.5 fL (ref 80.0–100.0)
Monocytes Absolute: 1.2 10*3/uL — ABNORMAL HIGH (ref 0.1–1.0)
Monocytes Relative: 13 %
Neutro Abs: 7.9 10*3/uL — ABNORMAL HIGH (ref 1.7–7.7)
Neutrophils Relative %: 81 %
Platelet Count: 232 10*3/uL (ref 150–400)
RBC: 4.38 MIL/uL (ref 4.22–5.81)
RDW: 21.3 % — ABNORMAL HIGH (ref 11.5–15.5)
WBC Count: 9.6 10*3/uL (ref 4.0–10.5)
nRBC: 0 % (ref 0.0–0.2)

## 2022-05-19 LAB — CMP (CANCER CENTER ONLY)
ALT: 16 U/L (ref 0–44)
AST: 35 U/L (ref 15–41)
Albumin: 3.4 g/dL — ABNORMAL LOW (ref 3.5–5.0)
Alkaline Phosphatase: 247 U/L — ABNORMAL HIGH (ref 38–126)
Anion gap: 11 (ref 5–15)
BUN: 12 mg/dL (ref 6–20)
CO2: 24 mmol/L (ref 22–32)
Calcium: 9.2 mg/dL (ref 8.9–10.3)
Chloride: 100 mmol/L (ref 98–111)
Creatinine: 0.91 mg/dL (ref 0.61–1.24)
GFR, Estimated: >60 mL/min (ref >60)
Glucose, Bld: 136 mg/dL — ABNORMAL HIGH (ref 70–99)
Potassium: 3.6 mmol/L (ref 3.5–5.1)
Sodium: 135 mmol/L (ref 135–145)
Total Bilirubin: 1.3 mg/dL — ABNORMAL HIGH (ref 0.3–1.2)
Total Protein: 7.4 g/dL (ref 6.5–8.1)

## 2022-05-19 LAB — MAGNESIUM: Magnesium: 1.7 mg/dL (ref 1.7–2.4)

## 2022-05-19 MED ORDER — TESTOSTERONE CYPIONATE 200 MG/ML IM SOLN
400.0000 mg | INTRAMUSCULAR | Status: DC
  Administered 2022-05-19: 400 mg via INTRAMUSCULAR
  Filled 2022-05-19: qty 2

## 2022-05-19 MED ORDER — HEPARIN SOD (PORK) LOCK FLUSH 100 UNIT/ML IV SOLN
500.0000 [IU] | Freq: Once | INTRAVENOUS | Status: AC | PRN
  Administered 2022-05-19: 500 [IU]

## 2022-05-19 MED ORDER — HYDROMORPHONE HCL 1 MG/ML IJ SOLN: 2.0000 mg | Freq: Once | INTRAMUSCULAR | Status: DC

## 2022-05-19 MED ORDER — SODIUM CHLORIDE 0.9 % IV SOLN
500.0000 mg/m2 | Freq: Once | INTRAVENOUS | Status: AC
  Administered 2022-05-19: 1220 mg via INTRAVENOUS
  Filled 2022-05-19: qty 42

## 2022-05-19 MED ORDER — SODIUM CHLORIDE 0.9 % IV SOLN
150.0000 mg | Freq: Once | INTRAVENOUS | Status: AC
  Administered 2022-05-19: 150 mg via INTRAVENOUS
  Filled 2022-05-19: qty 150

## 2022-05-19 MED ORDER — MAGNESIUM SULFATE 2 GM/50ML IV SOLN
2.0000 g | Freq: Once | INTRAVENOUS | Status: AC
  Administered 2022-05-19: 2 g via INTRAVENOUS
  Filled 2022-05-19: qty 50

## 2022-05-19 MED ORDER — SODIUM CHLORIDE 0.9 % IV SOLN: Freq: Once | INTRAVENOUS | Status: AC

## 2022-05-19 MED ORDER — HYDROMORPHONE HCL 1 MG/ML IJ SOLN
2.0000 mg | Freq: Once | INTRAMUSCULAR | Status: AC
  Administered 2022-05-19: 2 mg via INTRAVENOUS
  Filled 2022-05-19: qty 2

## 2022-05-19 MED ORDER — PALONOSETRON HCL INJECTION 0.25 MG/5ML
0.2500 mg | Freq: Once | INTRAVENOUS | Status: AC
  Administered 2022-05-19: 0.25 mg via INTRAVENOUS
  Filled 2022-05-19: qty 5

## 2022-05-19 MED ORDER — DOXORUBICIN HCL CHEMO IV INJECTION 2 MG/ML
50.0000 mg/m2 | Freq: Once | INTRAVENOUS | Status: AC
  Administered 2022-05-19: 122 mg via INTRAVENOUS
  Filled 2022-05-19: qty 61

## 2022-05-19 MED ORDER — PROCHLORPERAZINE MALEATE 10 MG PO TABS
10.0000 mg | ORAL_TABLET | Freq: Four times a day (QID) | ORAL | 1 refills | Status: AC | PRN
Start: 2022-05-19 — End: ?
  Filled 2022-05-19: qty 30, 8d supply, fill #0

## 2022-05-19 MED ORDER — DEXAMETHASONE 4 MG PO TABS
8.0000 mg | ORAL_TABLET | Freq: Every day | ORAL | 1 refills | Status: DC
Start: 2022-05-19 — End: 2022-08-01
  Filled 2022-05-19: qty 30, 15d supply, fill #0

## 2022-05-19 MED ORDER — SODIUM CHLORIDE 0.9 % IV SOLN
10.0000 mg | Freq: Once | INTRAVENOUS | Status: AC
  Administered 2022-05-19: 10 mg via INTRAVENOUS
  Filled 2022-05-19: qty 10

## 2022-05-19 MED ORDER — POTASSIUM CHLORIDE IN NACL 20-0.9 MEQ/L-% IV SOLN
Freq: Once | INTRAVENOUS | Status: AC
  Filled 2022-05-19: qty 1000

## 2022-05-19 MED ORDER — ONDANSETRON HCL 8 MG PO TABS
8.0000 mg | ORAL_TABLET | Freq: Three times a day (TID) | ORAL | 1 refills | Status: DC | PRN
Start: 2022-05-19 — End: 2022-08-01
  Filled 2022-05-19: qty 30, 10d supply, fill #0

## 2022-05-19 MED ORDER — SODIUM CHLORIDE 0.9 % IV SOLN
50.0000 mg/m2 | Freq: Once | INTRAVENOUS | Status: AC
  Administered 2022-05-19: 122 mg via INTRAVENOUS
  Filled 2022-05-19: qty 122

## 2022-05-19 MED ORDER — SODIUM CHLORIDE 0.9% FLUSH
10.0000 mL | INTRAVENOUS | Status: DC | PRN
  Administered 2022-05-19: 10 mL

## 2022-05-19 NOTE — Progress Notes (Signed)
OK to start IV hydration prior to CBC and CMET results are back from today per order of Dr. Marin Olp.

## 2022-05-19 NOTE — Progress Notes (Signed)
OK to run hydration fluids with Cisplatin per order of Dr. Ennever.  

## 2022-05-21 ENCOUNTER — Other Ambulatory Visit: Payer: Self-pay

## 2022-05-24 ENCOUNTER — Other Ambulatory Visit: Payer: Self-pay

## 2022-05-25 ENCOUNTER — Other Ambulatory Visit: Payer: Self-pay

## 2022-05-25 DIAGNOSIS — C73 Malignant neoplasm of thyroid gland: Secondary | ICD-10-CM

## 2022-05-25 MED ORDER — CHLORPROMAZINE HCL 25 MG PO TABS
25.0000 mg | ORAL_TABLET | Freq: Four times a day (QID) | ORAL | 3 refills | Status: AC | PRN
Start: 2022-05-25 — End: ?

## 2022-05-26 ENCOUNTER — Telehealth: Payer: Self-pay | Admitting: Dietician

## 2022-05-26 NOTE — Telephone Encounter (Signed)
Patient screened on MST. First attempt to reach. Provided my cell# on voice mail to return call to set up a nutrition consult. ° °Cyndi Arnell Slivinski, RDN, LDN °Registered Dietitian, [facility] °Part Time Remote (Usual office hours: Tuesday-Thursday) °Cell: [phone]   °

## 2022-05-27 ENCOUNTER — Encounter: Payer: Self-pay | Admitting: *Deleted

## 2022-05-27 ENCOUNTER — Other Ambulatory Visit: Payer: Self-pay | Admitting: *Deleted

## 2022-05-27 ENCOUNTER — Other Ambulatory Visit (HOSPITAL_BASED_OUTPATIENT_CLINIC_OR_DEPARTMENT_OTHER): Payer: Self-pay

## 2022-05-27 ENCOUNTER — Ambulatory Visit (HOSPITAL_BASED_OUTPATIENT_CLINIC_OR_DEPARTMENT_OTHER)
Admission: RE | Admit: 2022-05-27 | Discharge: 2022-05-27 | Disposition: A | Discharge status: Home / Self Care | Payer: BC Managed Care – PPO | Source: Ambulatory Visit | Attending: Hematology & Oncology | Admitting: Hematology & Oncology

## 2022-05-27 DIAGNOSIS — R066 Hiccough: Secondary | ICD-10-CM

## 2022-05-27 DIAGNOSIS — Z0189 Encounter for other specified special examinations: Secondary | ICD-10-CM

## 2022-05-27 DIAGNOSIS — C787 Secondary malignant neoplasm of liver and intrahepatic bile duct: Secondary | ICD-10-CM

## 2022-05-27 DIAGNOSIS — C73 Malignant neoplasm of thyroid gland: Secondary | ICD-10-CM

## 2022-05-27 LAB — ECHOCARDIOGRAM COMPLETE: S' Lateral: 3.7 cm

## 2022-05-27 MED ORDER — BACLOFEN 10 MG PO TABS
10.0000 mg | ORAL_TABLET | Freq: Four times a day (QID) | ORAL | 0 refills | Status: AC | PRN
Start: 2022-05-27 — End: ?
  Filled 2022-05-27: qty 30, 8d supply, fill #0

## 2022-06-01 ENCOUNTER — Other Ambulatory Visit: Payer: Self-pay | Admitting: *Deleted

## 2022-06-01 DIAGNOSIS — K219 Gastro-esophageal reflux disease without esophagitis: Secondary | ICD-10-CM

## 2022-06-01 DIAGNOSIS — K1379 Other lesions of oral mucosa: Secondary | ICD-10-CM

## 2022-06-01 MED ORDER — MAGIC MOUTHWASH
ORAL | 0 refills | Status: DC
Start: 2022-06-01 — End: 2022-08-01

## 2022-06-02 ENCOUNTER — Inpatient Hospital Stay: Payer: BC Managed Care – PPO

## 2022-06-02 ENCOUNTER — Encounter: Payer: Self-pay | Admitting: Medical Oncology

## 2022-06-02 ENCOUNTER — Other Ambulatory Visit (HOSPITAL_BASED_OUTPATIENT_CLINIC_OR_DEPARTMENT_OTHER): Payer: Self-pay

## 2022-06-02 ENCOUNTER — Inpatient Hospital Stay: Payer: BC Managed Care – PPO | Admitting: Medical Oncology

## 2022-06-02 ENCOUNTER — Other Ambulatory Visit: Payer: Self-pay

## 2022-06-02 ENCOUNTER — Inpatient Hospital Stay (HOSPITAL_BASED_OUTPATIENT_CLINIC_OR_DEPARTMENT_OTHER): Payer: BC Managed Care – PPO | Admitting: Medical Oncology

## 2022-06-02 ENCOUNTER — Telehealth: Payer: Self-pay | Admitting: *Deleted

## 2022-06-02 VITALS — BP 111/73 | HR 93 | Temp 98.5°F | Resp 18

## 2022-06-02 DIAGNOSIS — C799 Secondary malignant neoplasm of unspecified site: Secondary | ICD-10-CM

## 2022-06-02 DIAGNOSIS — D6481 Anemia due to antineoplastic chemotherapy: Secondary | ICD-10-CM

## 2022-06-02 DIAGNOSIS — T451X5A Adverse effect of antineoplastic and immunosuppressive drugs, initial encounter: Secondary | ICD-10-CM

## 2022-06-02 DIAGNOSIS — D509 Iron deficiency anemia, unspecified: Secondary | ICD-10-CM

## 2022-06-02 DIAGNOSIS — E291 Testicular hypofunction: Secondary | ICD-10-CM

## 2022-06-02 DIAGNOSIS — D702 Other drug-induced agranulocytosis: Secondary | ICD-10-CM | POA: Diagnosis not present

## 2022-06-02 DIAGNOSIS — D6959 Other secondary thrombocytopenia: Secondary | ICD-10-CM | POA: Diagnosis not present

## 2022-06-02 DIAGNOSIS — C73 Malignant neoplasm of thyroid gland: Secondary | ICD-10-CM

## 2022-06-02 DIAGNOSIS — T50905A Adverse effect of unspecified drugs, medicaments and biological substances, initial encounter: Secondary | ICD-10-CM | POA: Diagnosis not present

## 2022-06-02 DIAGNOSIS — C787 Secondary malignant neoplasm of liver and intrahepatic bile duct: Secondary | ICD-10-CM | POA: Diagnosis not present

## 2022-06-02 LAB — CBC WITH DIFFERENTIAL (CANCER CENTER ONLY)
Abs Immature Granulocytes: 0 10*3/uL (ref 0.00–0.07)
Basophils Absolute: 0 10*3/uL (ref 0.0–0.1)
Basophils Relative: 0 %
Eosinophils Absolute: 0 10*3/uL (ref 0.0–0.5)
Eosinophils Relative: 0 %
HCT: 29.3 % — ABNORMAL LOW (ref 39.0–52.0)
Hemoglobin: 9 g/dL — ABNORMAL LOW (ref 13.0–17.0)
Immature Granulocytes: 0 %
Lymphocytes Relative: 29 %
Lymphs Abs: 0.1 10*3/uL — ABNORMAL LOW (ref 0.7–4.0)
MCH: 27.4 pg (ref 26.0–34.0)
MCHC: 30.7 g/dL (ref 30.0–36.0)
MCV: 89.1 fL (ref 80.0–100.0)
Monocytes Absolute: 0.2 10*3/uL (ref 0.1–1.0)
Monocytes Relative: 50 %
Neutro Abs: 0.1 10*3/uL — CL (ref 1.7–7.7)
Neutrophils Relative %: 21 %
Platelet Count: 74 10*3/uL — ABNORMAL LOW (ref 150–400)
RBC: 3.29 MIL/uL — ABNORMAL LOW (ref 4.22–5.81)
RDW: 20.9 % — ABNORMAL HIGH (ref 11.5–15.5)
WBC Count: 0.5 10*3/uL — CL (ref 4.0–10.5)
nRBC: 0 % (ref 0.0–0.2)

## 2022-06-02 LAB — CMP (CANCER CENTER ONLY)
ALT: 17 U/L (ref 0–44)
AST: 24 U/L (ref 15–41)
Albumin: 3.2 g/dL — ABNORMAL LOW (ref 3.5–5.0)
Alkaline Phosphatase: 303 U/L — ABNORMAL HIGH (ref 38–126)
Anion gap: 11 (ref 5–15)
BUN: 16 mg/dL (ref 6–20)
CO2: 27 mmol/L (ref 22–32)
Calcium: 8.7 mg/dL — ABNORMAL LOW (ref 8.9–10.3)
Chloride: 98 mmol/L (ref 98–111)
Creatinine: 0.94 mg/dL (ref 0.61–1.24)
GFR, Estimated: >60 mL/min (ref >60)
Glucose, Bld: 113 mg/dL — ABNORMAL HIGH (ref 70–99)
Potassium: 3.6 mmol/L (ref 3.5–5.1)
Sodium: 136 mmol/L (ref 135–145)
Total Bilirubin: 0.8 mg/dL (ref 0.3–1.2)
Total Protein: 6 g/dL — ABNORMAL LOW (ref 6.5–8.1)

## 2022-06-02 MED ORDER — HEPARIN SOD (PORK) LOCK FLUSH 100 UNIT/ML IV SOLN
500.0000 [IU] | Freq: Once | INTRAVENOUS | Status: AC
  Administered 2022-06-02: 500 [IU] via INTRAVENOUS

## 2022-06-02 MED ORDER — FILGRASTIM-SNDZ 480 MCG/0.8ML IJ SOSY
480.0000 ug | PREFILLED_SYRINGE | Freq: Once | INTRAMUSCULAR | Status: DC
  Filled 2022-06-02: qty 0.8

## 2022-06-02 MED ORDER — TESTOSTERONE CYPIONATE 200 MG/ML IM SOLN: 400.0000 mg | INTRAMUSCULAR | Status: DC

## 2022-06-02 MED ORDER — SODIUM CHLORIDE 0.9 % IV SOLN: INTRAVENOUS | Status: DC

## 2022-06-02 MED ORDER — SODIUM CHLORIDE 0.9% FLUSH
10.0000 mL | INTRAVENOUS | Status: AC | PRN
  Administered 2022-06-02: 10 mL via INTRAVENOUS

## 2022-06-02 MED ORDER — HYDROMORPHONE HCL 4 MG/ML IJ SOLN
2.0000 mg | INTRAMUSCULAR | Status: DC | PRN
  Administered 2022-06-02: 2 mg via INTRAVENOUS
  Filled 2022-06-02: qty 1

## 2022-06-02 MED ORDER — LEVOFLOXACIN 500 MG PO TABS
500.0000 mg | ORAL_TABLET | Freq: Every day | ORAL | 0 refills | Status: AC
  Filled 2022-06-02: qty 7, 7d supply, fill #0

## 2022-06-02 MED ORDER — FILGRASTIM-SNDZ 480 MCG/0.8ML IJ SOSY
480.0000 ug | PREFILLED_SYRINGE | Freq: Once | INTRAMUSCULAR | Status: AC
  Administered 2022-06-02: 480 ug via SUBCUTANEOUS
  Filled 2022-06-02: qty 0.8

## 2022-06-02 NOTE — Addendum Note (Signed)
Addended by: Margaretha Seeds on: 06/02/2022 02:55 PM   Modules accepted: Orders

## 2022-06-02 NOTE — Addendum Note (Signed)
Addended by: Mardi Mainland on: 06/02/2022 01:47 PM   Modules accepted: Orders

## 2022-06-02 NOTE — Telephone Encounter (Signed)
Critical WBC of .5 and ANC of .1 reported by Gery Pray in lab.  Patient to see a NP today.

## 2022-06-02 NOTE — Progress Notes (Signed)
Hematology and Oncology Follow Up Visit  Brian [name] [id number] 05/23/1975 46 y.o. 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 + Bilateral pulmonary emboli without right heart strain  DVT left popliteal vein    Past Therapy: Cabometyx 40 mg po q day -- start on 04/17/2019 - d/c on 07/17/2019 for progression Lenvima 24 mg po q day -- on hold due to proteinuria Taxotere/CDDP -- s/p cycle 4 - started on 09/13/2019 Adriamycin weekly 2 weeks on 1 week off - s/p cycle 8 --d/c on 04/16/2020 due to  progression Gavreto 400 mg po q day -- started on 05/17/2020  -- d/c on 06/16/2020 Pembrolizumab 200 mg IV q 3 week -- s/p cycle #8 - start on 06/26/2020 -- d/c on 01/16/2021 due to progression Vinblastine q week x 4 weeks on / 1 week off -- s/p  cycle #2 -- start on 02/24/2021 -- d/c on 05/20/2021 XRT for LEFT shoulder met -completed on 06/22/2021   Current Therapy:     FOLFOX -- s/p cycle #10 -- start on 07/08/2021 -- d/c on 05/12/2022 CDDP/Adria/Cytoxan -- start cycle # 1 on 05/19/2022 Zometa 4 mg IV q 2 months - next dose on 05/2022 Eliquis 5 mg po BID Testosterone 400 mg IM q 2 weeks   Interim History:  Brian [name] is here today to discuss his symptoms.   He reports fatigue, body aches, lethargy. He denies SOB, cough, urinary changes, dysuria, fever, skin infections. Symptoms have been present for the past few days. No sick contacts. He has rested to try to help symptoms but he is concerned that he is not tolerating his new chemotherapy well.   Appetite is down. He feels dehydrated  Overall, I would say that his performance status is probably ECOG 1.    Wt Readings from Last 3 Encounters:  05/19/22 246 lb 1.9 oz (111.6 kg)  05/12/22 263 lb (119.3 kg)  04/21/22 271 lb 0.6 oz (122.9 kg)      Medications:  Allergies as of 06/02/2022       Reactions   Dextromethorphan-guaifenesin Other (See Comments)   Irregular  heartbeat   Doxycycline Anaphylaxis, Nausea And Vomiting, Rash, Other (See Comments)   "heart arrythmia" and "dyspepsia" (only oral doxycycline causes reaction)   Gadobutrol Hives, Other (See Comments)   Patient had MRI scan at [facility]. Patient called one hour after he left imaging facility to report two "blisters" that came up on "back" of lip.   Gadolinium Derivatives Hives   Patient had MRI scan at [facility]. Patient called one hour after he left imaging facility to report two "blisters" that came up on "back" of lip.    Guaifenesin Palpitations      Ibuprofen Hives, Itching   Lisinopril Other (See Comments)   Angioedema   Pantoprazole Itching   Tramadol Hives, Itching   Nexavar [sorafenib] Hives   Barium Rash   Developed redness around neck after drinking 1st bottle of Barocat; pt was given Benedryl by ED Mds to be "on the safe side" before drinking 2nd bottle   Chlorhexidine Hives        Medication List        Accurate as of June 02, 2022  2:39 PM. If you have any questions, ask your nurse or doctor.          baclofen 10 MG tablet Commonly known as: LIORESAL Take 1 tablet (10 mg total) by mouth  4 (four) times daily as needed for muscle spasms.   chlorproMAZINE 25 MG tablet Commonly known as: THORAZINE Take 1 tablet (25 mg total) by mouth 4 (four) times daily as needed.   cyclobenzaprine 10 MG tablet Commonly known as: FLEXERIL Take 1 tablet (10 mg total) by mouth 3 (three) times daily as needed for muscle spasms.   dexamethasone 4 MG tablet Commonly known as: DECADRON Take 2 tablets (8 mg total) by mouth daily for 3 days starting the day after cisplatin chemotherapy. Take with food.   diphenhydrAMINE 25 mg capsule Commonly known as: BENADRYL Take 2 capsules (50 mg total) by mouth once for 1 dose.   Eliquis 5 MG Tabs tablet Generic drug: apixaban TAKE 1 TABLET (5 MG TOTAL) BY MOUTH 2 (TWO) TIMES DAILY.   EPINEPHrine 0.3 mg/0.3 mL Soaj  injection Commonly known as: EpiPen 2-Pak USE AS DIRECTED FOR LIFE THREATENING ALLERGIC REACTIONS   famotidine 40 MG tablet Commonly known as: PEPCID Take 1 tablet (40 mg total) by mouth 2 (two) times daily.   Fusion Plus Caps Take 1 capsule by mouth daily.   HYDROmorphone 8 MG tablet Commonly known as: DILAUDID Take 1 tablet (8 mg total) by mouth every 4 (four) hours as needed for severe pain.   levofloxacin 500 MG tablet Commonly known as: Levaquin Take 1 tablet (500 mg total) by mouth daily for 7 days. Started by: Rushie Chestnut, PA-C   levothyroxine 200 MCG tablet Commonly known as: SYNTHROID Take one tablet (200 mcg dose) by mouth daily. Total:  288 mcg/day.   LORazepam 1 MG tablet Commonly known as: ATIVAN Take 1 tablet (1 mg total) by mouth every 6 (six) hours as needed for anxiety (for nausea and vomiting).   magic mouthwash Soln Swish and spit 15 mls by mouth every four hours as needed for mouth pain.   methylphenidate 10 MG tablet Commonly known as: RITALIN Take 1 tablet (10 mg total) by mouth 2 (two) times daily.   metoCLOPramide 10 MG tablet Commonly known as: REGLAN Take 1 tablet (10 mg total) by mouth every 6 (six) hours as needed for nausea or vomiting.   metolazone 5 MG tablet Commonly known as: ZAROXOLYN Take 5 mg by mouth daily.   metoprolol tartrate 50 MG tablet Commonly known as: Lopressor Take 1 tablet (50 mg total) by mouth 2 (two) times daily.   multivitamin capsule Take 1 capsule by mouth daily.   OLANZapine 10 MG tablet Commonly known as: ZYPREXA TAKE 1 TABLET (10 MG TOTAL) BY MOUTH AT BEDTIME.   ondansetron 8 MG tablet Commonly known as: ZOFRAN Take 8 mg by mouth every 8 (eight) hours as needed.   ondansetron 8 MG tablet Commonly known as: Zofran Take 1 tablet (8 mg total) by mouth every 8 (eight) hours as needed for nausea or vomiting. Start on the third day after cisplatin.   PEG 3350 17 GM/SCOOP Powd Mix 17 grams (1  Capful) in 8 ounces of liquid and drink by mouth daily as needed.   potassium chloride SA 20 MEQ tablet Commonly known as: KLOR-CON M Take 1 tablet (20 mEq total) by mouth daily.   prednisoLONE acetate 1 % ophthalmic suspension Commonly known as: PRED FORTE   predniSONE 50 MG tablet Commonly known as: DELTASONE TAKE 1 TABLET BY MOUTH 13 HOURS BEFORE CT SCAN, 1 TABLET 7 HOURS BEFORE CT SCAN, AND 1 TABLET 1 HOUR BEFORE CT SCAN   prochlorperazine 10 MG tablet Commonly known as: COMPAZINE Take 1  tablet (10 mg total) by mouth every 6 (six) hours as needed (Nausea or vomiting).   promethazine 25 MG tablet Commonly known as: PHENERGAN Take 1 tablet (25 mg total) by mouth every 6 (six) hours as needed for nausea and vomiting.   senna-docusate 8.6-50 MG tablet Commonly known as: Senokot-S Take 1 tablet by mouth at bedtime as needed for mild constipation.   tamsulosin 0.4 MG Caps capsule Commonly known as: Flomax Take 1 capsule (0.4 mg total) by mouth daily after supper.   temazepam 30 MG capsule Commonly known as: RESTORIL Take 1 capsule (30 mg total) by mouth at bedtime as needed for sleep.        Allergies:  Allergies  Allergen Reactions   Dextromethorphan-Guaifenesin Other (See Comments)    Irregular heartbeat    Doxycycline Anaphylaxis, Nausea And Vomiting, Rash and Other (See Comments)    "heart arrythmia" and "dyspepsia" (only oral doxycycline causes reaction)   Gadobutrol Hives and Other (See Comments)    Patient had MRI scan at [facility]. Patient called one hour after he left imaging facility to report two "blisters" that came up on "back" of lip.   Gadolinium Derivatives Hives    Patient had MRI scan at [facility]. Patient called one hour after he left imaging facility to report two "blisters" that came up on "back" of lip.    Guaifenesin Palpitations        Ibuprofen Hives and Itching   Lisinopril Other (See Comments)    Angioedema    Pantoprazole Itching   Tramadol Hives and Itching   Nexavar [Sorafenib] Hives   Barium Rash    Developed redness around neck after drinking 1st bottle of Barocat; pt was given Benedryl by ED Mds to be "on the safe side" before drinking 2nd bottle   Chlorhexidine Hives    Past Medical History, Surgical history, Social history, and Family History were reviewed and updated.  Review of Systems: Review of Systems  Constitutional:  Positive for malaise/fatigue and weight loss. Negative for chills, diaphoresis and fever.  HENT: Negative.  Negative for nosebleeds and sore throat.   Eyes: Negative.   Respiratory: Negative.  Negative for cough, hemoptysis, sputum production, shortness of breath and wheezing.   Cardiovascular:  Negative for chest pain, palpitations and leg swelling.  Gastrointestinal:  Positive for heartburn. Negative for abdominal pain, blood in stool, constipation, diarrhea, melena, nausea and vomiting.  Genitourinary: Negative.  Negative for dysuria, flank pain, frequency, hematuria and urgency.  Musculoskeletal:  Positive for back pain, joint pain and myalgias. Negative for neck pain.  Skin: Negative.  Negative for itching and rash.  Neurological: Negative.  Negative for sensory change, speech change, weakness and headaches.  Endo/Heme/Allergies: Negative.   Psychiatric/Behavioral: Negative.       Physical Exam:  oral temperature is 98.5 F (36.9 C). His blood pressure is 111/73 and his pulse is 93. His respiration is 18 and oxygen saturation is 96%.   Wt Readings from Last 3 Encounters:  05/19/22 246 lb 1.9 oz (111.6 kg)  05/12/22 263 lb (119.3 kg)  04/21/22 271 lb 0.6 oz (122.9 kg)    Physical Exam Vitals reviewed.  Constitutional:      General: He is not in acute distress.    Appearance: He is ill-appearing. He is not toxic-appearing or diaphoretic.  HENT:     Head: Normocephalic and atraumatic.  Eyes:     Pupils: Pupils are equal, round, and reactive to  light.  Cardiovascular:  Rate and Rhythm: Normal rate and regular rhythm.     Heart sounds: Normal heart sounds.  Pulmonary:     Effort: Pulmonary effort is normal.     Breath sounds: Normal breath sounds.  Abdominal:     General: Bowel sounds are normal.     Palpations: Abdomen is soft.  Musculoskeletal:        General: No tenderness or deformity. Normal range of motion.     Cervical back: Normal range of motion.  Lymphadenopathy:     Head:     Right side of head: No submental, submandibular, tonsillar, preauricular, posterior auricular or occipital adenopathy.     Left side of head: No submental, submandibular, tonsillar, preauricular, posterior auricular or occipital adenopathy.     Cervical: No cervical adenopathy.     Right cervical: No superficial, deep or posterior cervical adenopathy.    Left cervical: No superficial, deep or posterior cervical adenopathy.     Upper Body:     Right upper body: No supraclavicular adenopathy.     Left upper body: No supraclavicular adenopathy.  Skin:    General: Skin is warm and dry.     Findings: No erythema or rash.  Neurological:     Mental Status: He is alert and oriented to person, place, and time.  Psychiatric:        Behavior: Behavior normal.        Thought Content: Thought content normal.        Judgment: Judgment normal.      Lab Results  Component Value Date   WBC 0.5 (LL) 06/02/2022   HGB 9.0 (L) 06/02/2022   HCT 29.3 (L) 06/02/2022   MCV 89.1 06/02/2022   PLT 74 (L) 06/02/2022   Lab Results  Component Value Date   FERRITIN 1,375 (H) 05/12/2022   IRON 54 05/12/2022   TIBC 237 (L) 05/12/2022   UIBC 183 05/12/2022   IRONPCTSAT 23 05/12/2022   Lab Results  Component Value Date   RETICCTPCT 1.9 05/12/2022   RBC 3.29 (L) 06/02/2022   No results found for: "KPAFRELGTCHN", "LAMBDASER", "KAPLAMBRATIO" No results found for: "IGGSERUM", "IGA", "IGMSERUM" No results found for: "TOTALPROTELP", "ALBUMINELP", "A1GS",  "A2GS", "BETS", "BETA2SER", "GAMS", "MSPIKE", "SPEI"   Chemistry      Component Value Date/Time   NA 136 06/02/2022 1125   NA 141 02/06/2019 1210   K 3.6 06/02/2022 1125   CL 98 06/02/2022 1125   CO2 27 06/02/2022 1125   BUN 16 06/02/2022 1125   BUN 8 02/06/2019 1210   CREATININE 0.94 06/02/2022 1125      Component Value Date/Time   CALCIUM 8.7 (L) 06/02/2022 1125   ALKPHOS 303 (H) 06/02/2022 1125   AST 24 06/02/2022 1125   ALT 17 06/02/2022 1125   BILITOT 0.8 06/02/2022 1125      Encounter Diagnoses  Name Primary?   Drug-induced neutropenia Yes   Drug-induced thrombocytopenia    Anemia due to antineoplastic chemotherapy     Impression and Plan: Brian [name] is a very pleasant 47 yo caucasian gentleman with an 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    New severe leukocytosis. New thrombocytopenia. Both secondary to his recent chemotherapy regimen. We discussed his labs results including his severe neutropenia. We discussed red flags for neutropenic fever, sepsis. Vitals are reassuring. He is tolerating liquids well. We had a long discussion and are going to try to keep him out of the emergency room if at all possible to  avoid secondary illness/exposures and per patient preference. Starting him on Levaquin which he has tolerated well in the past per patient for broad spectrum infection precaution. Due to his concurrent medications will prescribe 500 mg instead of 750 mg to reduce risk of QT prolongation. In addition we are administering 1L IVF today for hydration support as well as Zarxio to help stimulate bone marrow production. We will have him return tomorrow for a second dose of Zarzio and 1L IVF(low dose due to availability- declines going to another facility). He will have follow up on Friday with APP, labs +- Zarzio. We also discussed that in the future he may need dose reduction and Udenyca support to help prevent neutropenia/thrombocytopenia/anemia.  Alerting his Oncologist.   Today: 1L IVF today Zarxio today high dose Zarzio tomorrow 300 mg along with 1L IVF RTC Friday APP/MD, labs, IVF, +-Zarxio    Rushie Chestnut, PA-C 4/16/20242:39 PM

## 2022-06-02 NOTE — Patient Instructions (Signed)

## 2022-06-02 NOTE — Patient Instructions (Signed)

## 2022-06-02 NOTE — Addendum Note (Signed)
Addended by: Corky Sing on: 06/02/2022 01:29 PM   Modules accepted: Orders

## 2022-06-02 NOTE — Addendum Note (Signed)
Addended by: Marin Roberts A on: 06/02/2022 12:58 PM   Modules accepted: Orders

## 2022-06-02 NOTE — Addendum Note (Signed)
Addended by: Corky Sing on: 06/02/2022 02:02 PM   Modules accepted: Orders

## 2022-06-03 ENCOUNTER — Inpatient Hospital Stay (HOSPITAL_BASED_OUTPATIENT_CLINIC_OR_DEPARTMENT_OTHER): Payer: BC Managed Care – PPO | Admitting: Medical Oncology

## 2022-06-03 ENCOUNTER — Telehealth: Payer: Self-pay | Admitting: *Deleted

## 2022-06-03 ENCOUNTER — Inpatient Hospital Stay: Payer: BC Managed Care – PPO

## 2022-06-03 ENCOUNTER — Other Ambulatory Visit: Payer: Self-pay | Admitting: Medical Oncology

## 2022-06-03 ENCOUNTER — Other Ambulatory Visit: Payer: Self-pay | Admitting: Family

## 2022-06-03 VITALS — BP 99/63 | HR 84 | Temp 98.0°F | Resp 18

## 2022-06-03 DIAGNOSIS — C799 Secondary malignant neoplasm of unspecified site: Secondary | ICD-10-CM

## 2022-06-03 DIAGNOSIS — E291 Testicular hypofunction: Secondary | ICD-10-CM

## 2022-06-03 DIAGNOSIS — D509 Iron deficiency anemia, unspecified: Secondary | ICD-10-CM

## 2022-06-03 DIAGNOSIS — C73 Malignant neoplasm of thyroid gland: Secondary | ICD-10-CM

## 2022-06-03 DIAGNOSIS — D702 Other drug-induced agranulocytosis: Secondary | ICD-10-CM

## 2022-06-03 DIAGNOSIS — C787 Secondary malignant neoplasm of liver and intrahepatic bile duct: Secondary | ICD-10-CM | POA: Diagnosis not present

## 2022-06-03 LAB — CBC WITH DIFFERENTIAL (CANCER CENTER ONLY)
Abs Immature Granulocytes: 0 10*3/uL (ref 0.00–0.07)
Band Neutrophils: 21 %
Basophils Absolute: 0 10*3/uL (ref 0.0–0.1)
Basophils Relative: 0 %
Eosinophils Absolute: 0 10*3/uL (ref 0.0–0.5)
Eosinophils Relative: 0 %
HCT: 29.6 % — ABNORMAL LOW (ref 39.0–52.0)
Hemoglobin: 8.9 g/dL — ABNORMAL LOW (ref 13.0–17.0)
Lymphocytes Relative: 31 %
Lymphs Abs: 0.4 10*3/uL — ABNORMAL LOW (ref 0.7–4.0)
MCH: 27.6 pg (ref 26.0–34.0)
MCHC: 30.1 g/dL (ref 30.0–36.0)
MCV: 91.6 fL (ref 80.0–100.0)
Metamyelocytes Relative: 1 %
Monocytes Absolute: 0.4 10*3/uL (ref 0.1–1.0)
Monocytes Relative: 30 %
Myelocytes: 1 %
Neutro Abs: 0.5 10*3/uL — ABNORMAL LOW (ref 1.7–7.7)
Neutrophils Relative %: 16 %
Platelet Count: 77 10*3/uL — ABNORMAL LOW (ref 150–400)
RBC: 3.23 MIL/uL — ABNORMAL LOW (ref 4.22–5.81)
RDW: 20.8 % — ABNORMAL HIGH (ref 11.5–15.5)
WBC Count: 1.4 10*3/uL — ABNORMAL LOW (ref 4.0–10.5)
WBC Morphology: INCREASED
nRBC: 0 % (ref 0.0–0.2)

## 2022-06-03 LAB — CMP (CANCER CENTER ONLY)
ALT: 17 U/L (ref 0–44)
AST: 24 U/L (ref 15–41)
Albumin: 3.1 g/dL — ABNORMAL LOW (ref 3.5–5.0)
Alkaline Phosphatase: 340 U/L — ABNORMAL HIGH (ref 38–126)
Anion gap: 10 (ref 5–15)
BUN: 12 mg/dL (ref 6–20)
CO2: 27 mmol/L (ref 22–32)
Calcium: 8.4 mg/dL — ABNORMAL LOW (ref 8.9–10.3)
Chloride: 101 mmol/L (ref 98–111)
Creatinine: 0.95 mg/dL (ref 0.61–1.24)
GFR, Estimated: >60 mL/min (ref >60)
Glucose, Bld: 91 mg/dL (ref 70–99)
Potassium: 3.7 mmol/L (ref 3.5–5.1)
Sodium: 138 mmol/L (ref 135–145)
Total Bilirubin: 0.9 mg/dL (ref 0.3–1.2)
Total Protein: 5.8 g/dL — ABNORMAL LOW (ref 6.5–8.1)

## 2022-06-03 LAB — C-REACTIVE PROTEIN: CRP: 13.6 mg/dL — ABNORMAL HIGH (ref <1.0)

## 2022-06-03 LAB — LACTIC ACID, PLASMA: Lactic Acid, Venous: 3.2 mmol/L (ref 0.5–1.9)

## 2022-06-03 MED ORDER — TESTOSTERONE CYPIONATE 200 MG/ML IM SOLN: 400.0000 mg | INTRAMUSCULAR | Status: DC

## 2022-06-03 MED ORDER — SODIUM CHLORIDE 0.9 % IV SOLN: Freq: Once | INTRAVENOUS | Status: AC

## 2022-06-03 MED ORDER — EPINEPHRINE 0.3 MG/0.3ML IJ SOAJ: 0.3000 mg | Freq: Once | INTRAMUSCULAR | Status: DC | PRN

## 2022-06-03 MED ORDER — HEPARIN SOD (PORK) LOCK FLUSH 100 UNIT/ML IV SOLN: 250.0000 [IU] | Freq: Once | INTRAVENOUS | Status: DC | PRN

## 2022-06-03 MED ORDER — DIPHENHYDRAMINE HCL 50 MG/ML IJ SOLN: 50.0000 mg | Freq: Once | INTRAMUSCULAR | Status: DC | PRN

## 2022-06-03 MED ORDER — SODIUM CHLORIDE 0.9 % IV SOLN: Freq: Once | INTRAVENOUS | Status: DC

## 2022-06-03 MED ORDER — SODIUM CHLORIDE 0.9 % IV SOLN: Freq: Once | INTRAVENOUS | Status: DC | PRN

## 2022-06-03 MED ORDER — ALTEPLASE 2 MG IJ SOLR: 2.0000 mg | Freq: Once | INTRAMUSCULAR | Status: DC | PRN

## 2022-06-03 MED ORDER — HEPARIN SOD (PORK) LOCK FLUSH 100 UNIT/ML IV SOLN
500.0000 [IU] | Freq: Once | INTRAVENOUS | Status: AC
  Administered 2022-06-03: 500 [IU] via INTRAVENOUS

## 2022-06-03 MED ORDER — SODIUM CHLORIDE 0.9% FLUSH
10.0000 mL | Freq: Once | INTRAVENOUS | Status: AC
  Administered 2022-06-03: 10 mL

## 2022-06-03 MED ORDER — ALBUTEROL SULFATE (2.5 MG/3ML) 0.083% IN NEBU: 2.5000 mg | INHALATION_SOLUTION | Freq: Once | RESPIRATORY_TRACT | Status: DC | PRN

## 2022-06-03 MED ORDER — FILGRASTIM-SNDZ 480 MCG/0.8ML IJ SOSY
480.0000 ug | PREFILLED_SYRINGE | Freq: Once | INTRAMUSCULAR | Status: AC
  Administered 2022-06-03: 480 ug via SUBCUTANEOUS
  Filled 2022-06-03: qty 0.8

## 2022-06-03 MED ORDER — FAMOTIDINE IN NACL 20-0.9 MG/50ML-% IV SOLN: 20.0000 mg | Freq: Once | INTRAVENOUS | Status: DC | PRN

## 2022-06-03 MED ORDER — METHYLPREDNISOLONE SODIUM SUCC 125 MG IJ SOLR: 125.0000 mg | Freq: Once | INTRAMUSCULAR | Status: DC | PRN

## 2022-06-03 MED ORDER — SODIUM CHLORIDE 0.9% FLUSH: 3.0000 mL | Freq: Once | INTRAVENOUS | Status: DC | PRN

## 2022-06-03 NOTE — Progress Notes (Unsigned)
Critical value given to [employer] at [employer]

## 2022-06-03 NOTE — Progress Notes (Signed)
0900-Pt states that he feels "much better today" that pain is a 3/10 and that he does not need any additional pain medicine at this time.  Hold Testosterone today per order of S. Dutton PA.

## 2022-06-03 NOTE — Progress Notes (Signed)
Accidentally released emergency medications when releasing Zarxio. - MM

## 2022-06-03 NOTE — Telephone Encounter (Signed)
Critical lactic acid reported at 3.2 per  Gery Pray in Lab.  Eileen Stanford NP notified of result.  No orders received

## 2022-06-03 NOTE — Patient Instructions (Signed)
Filgrastim Injection What is this medication? FILGRASTIM (fil GRA stim) lowers the risk of infection in people who are receiving chemotherapy. It works by helping your body make more white blood cells, which protects your body from infection. It may also be used to help people who have been exposed to high doses of radiation. It can be used to help prepare your body before a stem cell transplant. It works by helping your bone marrow make and release stem cells into the blood. This medicine may be used for other purposes; ask your health care provider or pharmacist if you have questions. COMMON BRAND NAME(S): Neupogen, Nivestym, Releuko, Zarxio What should I tell my care team before I take this medication? They need to know if you have any of these conditions: History of blood diseases, such as sickle cell anemia Kidney disease Recent or ongoing radiation An unusual or allergic reaction to filgrastim, pegfilgrastim, latex, rubber, other medications, foods, dyes, or preservatives Pregnant or trying to get pregnant Breast-feeding How should I use this medication? This medication is injected under the skin or into a vein. It is usually given by your care team in a hospital or clinic setting. It may be given at home. If you get this medication at home, you will be taught how to prepare and give it. Use exactly as directed. Take it as directed on the prescription label at the same time every day. Keep taking it unless your care team tells you to stop. It is important that you put your used needles and syringes in a special sharps container. Do not put them in a trash can. If you do not have a sharps container, call your pharmacist or care team to get one. This medication comes with INSTRUCTIONS FOR USE. Ask your pharmacist for directions on how to use this medication. Read the information carefully. Talk to your pharmacist or care team if you have questions. Talk to your care team about the use of this  medication in children. While it may be prescribed for children for selected conditions, precautions do apply. Overdosage: If you think you have taken too much of this medicine contact a poison control center or emergency room at once. NOTE: This medicine is only for you. Do not share this medicine with others. What if I miss a dose? It is important not to miss any doses. Talk to your care team about what to do if you miss a dose. What may interact with this medication? Medications that may cause a release of neutrophils, such as lithium This list may not describe all possible interactions. Give your health care provider a list of all the medicines, herbs, non-prescription drugs, or dietary supplements you use. Also tell them if you smoke, drink alcohol, or use illegal drugs. Some items may interact with your medicine. What should I watch for while using this medication? Your condition will be monitored carefully while you are receiving this medication. You may need bloodwork while taking this medication. Talk to your care team about your risk of cancer. You may be more at risk for certain types of cancer if you take this medication. What side effects may I notice from receiving this medication? Side effects that you should report to your care team as soon as possible: Allergic reactions--skin rash, itching, hives, swelling of the face, lips, tongue, or throat Capillary leak syndrome--stomach or muscle pain, unusual weakness or fatigue, feeling faint or lightheaded, decrease in the amount of urine, swelling of the ankles, hands, or   feet, trouble breathing High white blood cell level--fever, fatigue, trouble breathing, night sweats, change in vision, weight loss Inflammation of the aorta--fever, fatigue, back, chest, or stomach pain, severe headache Kidney injury (glomerulonephritis)--decrease in the amount of urine, red or dark brown urine, foamy or bubbly urine, swelling of the ankles, hands, or  feet Shortness of breath or trouble breathing Spleen injury--pain in upper left stomach or shoulder Unusual bruising or bleeding Side effects that usually do not require medical attention (report to your care team if they continue or are bothersome): Back pain Bone pain Fatigue Fever Headache Nausea This list may not describe all possible side effects. Call your doctor for medical advice about side effects. You may report side effects to FDA at 1-800-FDA-1088. Where should I keep my medication? Keep out of the reach of children and pets. Keep this medication in the original packaging until you are ready to take it. Protect from light. See product for storage information. Each product may have different instructions. Get rid of any unused medication after the expiration date. To get rid of medications that are no longer needed or have expired: Take the medication to a medications take-back program. Check with your pharmacy or law enforcement to find a location. If you cannot return the medication, ask your pharmacist or care team how to get rid of this medication safely. NOTE: This sheet is a summary. It may not cover all possible information. If you have questions about this medicine, talk to your doctor, pharmacist, or health care provider.  2023 Elsevier/Gold Standard (2021-05-13 00:00:00)  

## 2022-06-04 ENCOUNTER — Inpatient Hospital Stay: Payer: BC Managed Care – PPO

## 2022-06-04 ENCOUNTER — Encounter: Payer: Self-pay | Admitting: Hematology & Oncology

## 2022-06-04 ENCOUNTER — Encounter: Payer: Self-pay | Admitting: Family

## 2022-06-04 NOTE — Progress Notes (Signed)
Pt here for IVF, vital recheck and Zarxio.   Symptoms and vitals improving. Patient feeling better. Suspect lactic acid will improve with addition IVF and additional time on Levaquin. Goal is to keep him out of the hospital per patient request. He is improving and thus we have elected to continue outpatient management though patient acknowledges the risks.

## 2022-06-05 ENCOUNTER — Encounter: Payer: Self-pay | Admitting: Family

## 2022-06-05 ENCOUNTER — Inpatient Hospital Stay: Payer: BC Managed Care – PPO

## 2022-06-05 ENCOUNTER — Other Ambulatory Visit: Payer: Self-pay

## 2022-06-05 ENCOUNTER — Inpatient Hospital Stay (HOSPITAL_BASED_OUTPATIENT_CLINIC_OR_DEPARTMENT_OTHER): Payer: BC Managed Care – PPO | Admitting: Family

## 2022-06-05 VITALS — Ht 70.0 in | Wt 259.1 lb

## 2022-06-05 VITALS — BP 107/72 | HR 96 | Temp 97.8°F | Resp 18

## 2022-06-05 DIAGNOSIS — C787 Secondary malignant neoplasm of liver and intrahepatic bile duct: Secondary | ICD-10-CM | POA: Diagnosis not present

## 2022-06-05 DIAGNOSIS — D509 Iron deficiency anemia, unspecified: Secondary | ICD-10-CM

## 2022-06-05 DIAGNOSIS — D702 Other drug-induced agranulocytosis: Secondary | ICD-10-CM | POA: Diagnosis not present

## 2022-06-05 DIAGNOSIS — D649 Anemia, unspecified: Secondary | ICD-10-CM | POA: Diagnosis not present

## 2022-06-05 DIAGNOSIS — C799 Secondary malignant neoplasm of unspecified site: Secondary | ICD-10-CM

## 2022-06-05 DIAGNOSIS — C73 Malignant neoplasm of thyroid gland: Secondary | ICD-10-CM

## 2022-06-05 DIAGNOSIS — R110 Nausea: Secondary | ICD-10-CM

## 2022-06-05 LAB — CMP (CANCER CENTER ONLY)
ALT: 14 U/L (ref 0–44)
AST: 24 U/L (ref 15–41)
Albumin: 3.1 g/dL — ABNORMAL LOW (ref 3.5–5.0)
Alkaline Phosphatase: 365 U/L — ABNORMAL HIGH (ref 38–126)
Anion gap: 9 (ref 5–15)
BUN: 13 mg/dL (ref 6–20)
CO2: 28 mmol/L (ref 22–32)
Calcium: 8.7 mg/dL — ABNORMAL LOW (ref 8.9–10.3)
Chloride: 97 mmol/L — ABNORMAL LOW (ref 98–111)
Creatinine: 1.09 mg/dL (ref 0.61–1.24)
GFR, Estimated: >60 mL/min (ref >60)
Glucose, Bld: 116 mg/dL — ABNORMAL HIGH (ref 70–99)
Potassium: 3.6 mmol/L (ref 3.5–5.1)
Sodium: 134 mmol/L — ABNORMAL LOW (ref 135–145)
Total Bilirubin: 0.6 mg/dL (ref 0.3–1.2)
Total Protein: 5.7 g/dL — ABNORMAL LOW (ref 6.5–8.1)

## 2022-06-05 LAB — CBC WITH DIFFERENTIAL (CANCER CENTER ONLY)
Abs Immature Granulocytes: 0.39 10*3/uL — ABNORMAL HIGH (ref 0.00–0.07)
Basophils Absolute: 0.1 10*3/uL (ref 0.0–0.1)
Basophils Relative: 1 %
Eosinophils Absolute: 0 10*3/uL (ref 0.0–0.5)
Eosinophils Relative: 0 %
HCT: 29.2 % — ABNORMAL LOW (ref 39.0–52.0)
Hemoglobin: 8.7 g/dL — ABNORMAL LOW (ref 13.0–17.0)
Immature Granulocytes: 7 %
Lymphocytes Relative: 6 %
Lymphs Abs: 0.3 10*3/uL — ABNORMAL LOW (ref 0.7–4.0)
MCH: 27.4 pg (ref 26.0–34.0)
MCHC: 29.8 g/dL — ABNORMAL LOW (ref 30.0–36.0)
MCV: 92.1 fL (ref 80.0–100.0)
Monocytes Absolute: 1.2 10*3/uL — ABNORMAL HIGH (ref 0.1–1.0)
Monocytes Relative: 22 %
Neutro Abs: 3.4 10*3/uL (ref 1.7–7.7)
Neutrophils Relative %: 64 %
Platelet Count: 106 10*3/uL — ABNORMAL LOW (ref 150–400)
RBC: 3.17 MIL/uL — ABNORMAL LOW (ref 4.22–5.81)
RDW: 21.3 % — ABNORMAL HIGH (ref 11.5–15.5)
WBC Count: 5.4 10*3/uL (ref 4.0–10.5)
nRBC: 0.7 % — ABNORMAL HIGH (ref 0.0–0.2)

## 2022-06-05 LAB — IRON AND IRON BINDING CAPACITY (CC-WL,HP ONLY)
Iron: 43 ug/dL — ABNORMAL LOW (ref 45–182)
Saturation Ratios: 22 % (ref 17.9–39.5)
TIBC: 192 ug/dL — ABNORMAL LOW (ref 250–450)
UIBC: 149 ug/dL (ref 117–376)

## 2022-06-05 LAB — TYPE AND SCREEN: ABO/RH(D): O NEG

## 2022-06-05 LAB — SAMPLE TO BLOOD BANK

## 2022-06-05 LAB — FERRITIN: Ferritin: 1408 ng/mL — ABNORMAL HIGH (ref 24–336)

## 2022-06-05 LAB — LACTIC ACID, PLASMA: Lactic Acid, Venous: 2.5 mmol/L (ref 0.5–1.9)

## 2022-06-05 LAB — PREPARE RBC (CROSSMATCH)

## 2022-06-05 MED ORDER — SODIUM CHLORIDE 0.9% FLUSH
10.0000 mL | INTRAVENOUS | Status: AC | PRN
  Administered 2022-06-05: 10 mL

## 2022-06-05 MED ORDER — SODIUM CHLORIDE 0.9 % IV SOLN: Freq: Once | INTRAVENOUS | Status: AC

## 2022-06-05 MED ORDER — SODIUM CHLORIDE 0.9% IV SOLUTION: 250.0000 mL | Freq: Once | INTRAVENOUS | Status: DC

## 2022-06-05 MED ORDER — DIPHENHYDRAMINE HCL 25 MG PO CAPS
25.0000 mg | ORAL_CAPSULE | Freq: Once | ORAL | Status: AC
  Administered 2022-06-05: 25 mg via ORAL
  Filled 2022-06-05: qty 1

## 2022-06-05 MED ORDER — HEPARIN SOD (PORK) LOCK FLUSH 100 UNIT/ML IV SOLN
250.0000 [IU] | INTRAVENOUS | Status: AC | PRN
  Administered 2022-06-05: 500 [IU]

## 2022-06-05 MED ORDER — ACETAMINOPHEN 325 MG PO TABS
650.0000 mg | ORAL_TABLET | Freq: Once | ORAL | Status: AC
  Administered 2022-06-05: 650 mg via ORAL
  Filled 2022-06-05: qty 2

## 2022-06-05 NOTE — Progress Notes (Signed)
Per Eileen Stanford, d/t low BP, no lasix

## 2022-06-05 NOTE — Patient Instructions (Signed)

## 2022-06-05 NOTE — Patient Instructions (Signed)

## 2022-06-05 NOTE — Progress Notes (Signed)
Hematology and Oncology Follow Up Visit  Brian [name] [id number] Oct 29, 1975 47 y.o. 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 + Bilateral pulmonary emboli without right heart strain  DVT left popliteal vein    Past Therapy: Cabometyx 40 mg po q day -- start on 04/17/2019 - d/c on 07/17/2019 for progression Lenvima 24 mg po q day -- on hold due to proteinuria Taxotere/CDDP -- s/p cycle 4 - started on 09/13/2019 Adriamycin weekly 2 weeks on 1 week off - s/p cycle 8 --d/c on 04/16/2020 due to  progression Gavreto 400 mg po q day -- started on 05/17/2020  -- d/c on 06/16/2020 Pembrolizumab 200 mg IV q 3 week -- s/p cycle #8 - start on 06/26/2020 -- d/c on 01/16/2021 due to progression Vinblastine q week x 4 weeks on / 1 week off -- s/p  cycle #2 -- start on 02/24/2021 -- d/c on 05/20/2021 XRT for LEFT shoulder met -completed on 06/22/2021   Current Therapy:     FOLFOX -- s/p cycle #10 -- start on 07/08/2021 -- d/c on 05/12/2022 CDDP/Adria/Cytoxan -- start cycle # 1 on 05/19/2022 Zometa 4 mg IV q 2 months - next dose on 05/2022 Eliquis 5 mg po BID Testosterone 400 mg IM q 2 weeks   Interim History:  [name] is here today for follow-up post treatment for neutropenia. He has responded nicely to the Zarxio. He notes that he is feeling much better.  He still has persistent fatigue with Hgb 8.7.  He has not noted any blood loss. No abnormal bruising, no petechiae.  No fever, chills, n/v, cough, rash, dizziness, SOB, chest pain, palpitations, abdominal pain or changes in bowel or bladder habits. 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  No falls or syncope reported.  Appetite and hydration are improving. Weight is 259 lbs.   ECOG Performance Status: 1 - Symptomatic but completely ambulatory  Medications:  Allergies as of 06/05/2022       Reactions   Dextromethorphan-guaifenesin Other (See Comments)    Irregular heartbeat   Doxycycline Anaphylaxis, Nausea And Vomiting, Rash, Other (See Comments)   "heart arrythmia" and "dyspepsia" (only oral doxycycline causes reaction)   Gadobutrol Hives, Other (See Comments)   Patient had MRI scan at [facility]. Patient called one hour after he left imaging facility to report two "blisters" that came up on "back" of lip.   Gadolinium Derivatives Hives   Patient had MRI scan at [facility]. Patient called one hour after he left imaging facility to report two "blisters" that came up on "back" of lip.    Guaifenesin Palpitations      Ibuprofen Hives, Itching   Lisinopril Other (See Comments)   Angioedema   Pantoprazole Itching   Tramadol Hives, Itching   Nexavar [sorafenib] Hives   Barium Rash   Developed redness around neck after drinking 1st bottle of Barocat; pt was given Benedryl by ED Mds to be "on the safe side" before drinking 2nd bottle   Chlorhexidine Hives        Medication List        Accurate as of June 05, 2022  9:11 AM. If you have any questions, ask your nurse or doctor.          baclofen 10 MG tablet Commonly known as: LIORESAL Take 1 tablet (10 mg total) by mouth 4 (four) times daily as needed for muscle spasms.  chlorproMAZINE 25 MG tablet Commonly known as: THORAZINE Take 1 tablet (25 mg total) by mouth 4 (four) times daily as needed.   cyclobenzaprine 10 MG tablet Commonly known as: FLEXERIL Take 1 tablet (10 mg total) by mouth 3 (three) times daily as needed for muscle spasms.   dexamethasone 4 MG tablet Commonly known as: DECADRON Take 2 tablets (8 mg total) by mouth daily for 3 days starting the day after cisplatin chemotherapy. Take with food.   diphenhydrAMINE 25 mg capsule Commonly known as: BENADRYL Take 2 capsules (50 mg total) by mouth once for 1 dose.   Eliquis 5 MG Tabs tablet Generic drug: apixaban TAKE 1 TABLET (5 MG TOTAL) BY MOUTH 2 (TWO) TIMES DAILY.   EPINEPHrine 0.3 mg/0.3  mL Soaj injection Commonly known as: EpiPen 2-Pak USE AS DIRECTED FOR LIFE THREATENING ALLERGIC REACTIONS   famotidine 40 MG tablet Commonly known as: PEPCID Take 1 tablet (40 mg total) by mouth 2 (two) times daily.   Fusion Plus Caps Take 1 capsule by mouth daily.   HYDROmorphone 8 MG tablet Commonly known as: DILAUDID Take 1 tablet (8 mg total) by mouth every 4 (four) hours as needed for severe pain.   levofloxacin 500 MG tablet Commonly known as: Levaquin Take 1 tablet (500 mg total) by mouth daily for 7 days.   levothyroxine 200 MCG tablet Commonly known as: SYNTHROID Take one tablet (200 mcg dose) by mouth daily. Total:  288 mcg/day.   LORazepam 1 MG tablet Commonly known as: ATIVAN Take 1 tablet (1 mg total) by mouth every 6 (six) hours as needed for anxiety (for nausea and vomiting).   magic mouthwash Soln Swish and spit 15 mls by mouth every four hours as needed for mouth pain.   methylphenidate 10 MG tablet Commonly known as: RITALIN Take 1 tablet (10 mg total) by mouth 2 (two) times daily.   metoCLOPramide 10 MG tablet Commonly known as: REGLAN Take 1 tablet (10 mg total) by mouth every 6 (six) hours as needed for nausea or vomiting.   metolazone 5 MG tablet Commonly known as: ZAROXOLYN Take 5 mg by mouth daily.   metoprolol tartrate 50 MG tablet Commonly known as: Lopressor Take 1 tablet (50 mg total) by mouth 2 (two) times daily.   multivitamin capsule Take 1 capsule by mouth daily.   OLANZapine 10 MG tablet Commonly known as: ZYPREXA TAKE 1 TABLET (10 MG TOTAL) BY MOUTH AT BEDTIME.   ondansetron 8 MG tablet Commonly known as: ZOFRAN Take 8 mg by mouth every 8 (eight) hours as needed.   ondansetron 8 MG tablet Commonly known as: Zofran Take 1 tablet (8 mg total) by mouth every 8 (eight) hours as needed for nausea or vomiting. Start on the third day after cisplatin.   PEG 3350 17 GM/SCOOP Powd Mix 17 grams (1 Capful) in 8 ounces of liquid and  drink by mouth daily as needed.   potassium chloride SA 20 MEQ tablet Commonly known as: KLOR-CON M Take 1 tablet (20 mEq total) by mouth daily.   prednisoLONE acetate 1 % ophthalmic suspension Commonly known as: PRED FORTE   predniSONE 50 MG tablet Commonly known as: DELTASONE TAKE 1 TABLET BY MOUTH 13 HOURS BEFORE CT SCAN, 1 TABLET 7 HOURS BEFORE CT SCAN, AND 1 TABLET 1 HOUR BEFORE CT SCAN   prochlorperazine 10 MG tablet Commonly known as: COMPAZINE Take 1 tablet (10 mg total) by mouth every 6 (six) hours as needed (Nausea or vomiting).  promethazine 25 MG tablet Commonly known as: PHENERGAN Take 1 tablet (25 mg total) by mouth every 6 (six) hours as needed for nausea and vomiting.   senna-docusate 8.6-50 MG tablet Commonly known as: Senokot-S Take 1 tablet by mouth at bedtime as needed for mild constipation.   tamsulosin 0.4 MG Caps capsule Commonly known as: Flomax Take 1 capsule (0.4 mg total) by mouth daily after supper.   temazepam 30 MG capsule Commonly known as: RESTORIL Take 1 capsule (30 mg total) by mouth at bedtime as needed for sleep.        Allergies:  Allergies  Allergen Reactions   Dextromethorphan-Guaifenesin Other (See Comments)    Irregular heartbeat    Doxycycline Anaphylaxis, Nausea And Vomiting, Rash and Other (See Comments)    "heart arrythmia" and "dyspepsia" (only oral doxycycline causes reaction)   Gadobutrol Hives and Other (See Comments)    Patient had MRI scan at [facility]. Patient called one hour after he left imaging facility to report two "blisters" that came up on "back" of lip.   Gadolinium Derivatives Hives    Patient had MRI scan at [facility]. Patient called one hour after he left imaging facility to report two "blisters" that came up on "back" of lip.    Guaifenesin Palpitations        Ibuprofen Hives and Itching   Lisinopril Other (See Comments)    Angioedema   Pantoprazole Itching   Tramadol Hives and  Itching   Nexavar [Sorafenib] Hives   Barium Rash    Developed redness around neck after drinking 1st bottle of Barocat; pt was given Benedryl by ED Mds to be "on the safe side" before drinking 2nd bottle   Chlorhexidine Hives    Past Medical History, Surgical history, Social history, and Family History were reviewed and updated.  Review of Systems: All other 10 point review of systems is negative.   Physical Exam:  height is 5\' 10"  (1.778 m) and weight is 259 lb 1.9 oz (117.5 kg).   Wt Readings from Last 3 Encounters:  06/05/22 259 lb 1.9 oz (117.5 kg)  05/19/22 246 lb 1.9 oz (111.6 kg)  05/12/22 263 lb (119.3 kg)    Ocular: Sclerae unicteric, pupils equal, round and reactive to light Ear-nose-throat: Oropharynx clear, dentition fair Lymphatic: No cervical or supraclavicular adenopathy Lungs no rales or rhonchi, good excursion bilaterally Heart regular rate and rhythm, no murmur appreciated Abd soft, nontender, positive bowel sounds MSK no focal spinal tenderness, no joint edema Neuro: non-focal, well-oriented, appropriate affect Breasts: Deferred   Lab Results  Component Value Date   WBC 5.4 06/05/2022   HGB 8.7 (L) 06/05/2022   HCT 29.2 (L) 06/05/2022   MCV 92.1 06/05/2022   PLT 106 (L) 06/05/2022   Lab Results  Component Value Date   FERRITIN 1,375 (H) 05/12/2022   IRON 54 05/12/2022   TIBC 237 (L) 05/12/2022   UIBC 183 05/12/2022   IRONPCTSAT 23 05/12/2022   Lab Results  Component Value Date   RETICCTPCT 1.9 05/12/2022   RBC 3.17 (L) 06/05/2022   No results found for: "KPAFRELGTCHN", "LAMBDASER", "KAPLAMBRATIO" No results found for: "IGGSERUM", "IGA", "IGMSERUM" No results found for: "TOTALPROTELP", "ALBUMINELP", "A1GS", "A2GS", "BETS", "BETA2SER", "GAMS", "MSPIKE", "SPEI"   Chemistry      Component Value Date/Time   NA 138 06/03/2022 0935   NA 141 02/06/2019 1210   K 3.7 06/03/2022 0935   CL 101 06/03/2022 0935   CO2 27 06/03/2022 0935   BUN  12  06/03/2022 0935   BUN 8 02/06/2019 1210   CREATININE 0.95 06/03/2022 0935      Component Value Date/Time   CALCIUM 8.4 (L) 06/03/2022 0935   ALKPHOS 340 (H) 06/03/2022 0935   AST 24 06/03/2022 0935   ALT 17 06/03/2022 0935   BILITOT 0.9 06/03/2022 0935       Impression and Plan: [name] is a very pleasant 47 yo caucasian gentleman with an 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    Fluids and 1 unit of blood today. Neutropenia stable at this time.  Lactic acid and iron studies are pending.  He has follow-up with MD and treatment on 4/30.  He will contact our office with any questions or concerns. We can certainly see him sooner if needed.   Eileen Stanford, NP 4/19/20249:11 AM

## 2022-06-06 LAB — TYPE AND SCREEN
Antibody Screen: NEGATIVE
Unit division: 0

## 2022-06-06 LAB — BPAM RBC
Blood Product Expiration Date: 202405232359
ISSUE DATE / TIME: 202404191205
Unit Type and Rh: 9500

## 2022-06-06 LAB — TESTOSTERONE: Testosterone: 594 ng/dL (ref 264–916)

## 2022-06-08 NOTE — Progress Notes (Signed)
[facility] Attending physicians statement completed and faxed with confirmation received.

## 2022-06-09 ENCOUNTER — Other Ambulatory Visit: Payer: Self-pay

## 2022-06-11 ENCOUNTER — Other Ambulatory Visit (HOSPITAL_BASED_OUTPATIENT_CLINIC_OR_DEPARTMENT_OTHER): Payer: Self-pay

## 2022-06-11 ENCOUNTER — Other Ambulatory Visit: Payer: Self-pay | Admitting: Hematology & Oncology

## 2022-06-11 DIAGNOSIS — M546 Pain in thoracic spine: Secondary | ICD-10-CM

## 2022-06-11 DIAGNOSIS — C73 Malignant neoplasm of thyroid gland: Secondary | ICD-10-CM

## 2022-06-11 DIAGNOSIS — R110 Nausea: Secondary | ICD-10-CM

## 2022-06-11 DIAGNOSIS — C787 Secondary malignant neoplasm of liver and intrahepatic bile duct: Secondary | ICD-10-CM

## 2022-06-12 ENCOUNTER — Other Ambulatory Visit (HOSPITAL_BASED_OUTPATIENT_CLINIC_OR_DEPARTMENT_OTHER): Payer: Self-pay

## 2022-06-12 MED ORDER — TEMAZEPAM 30 MG PO CAPS
30.0000 mg | ORAL_CAPSULE | Freq: Every evening | ORAL | 0 refills | Status: DC | PRN
Start: 2022-06-12 — End: 2022-07-02
  Filled 2022-06-12 – 2022-06-16 (×2): qty 30, 30d supply, fill #0

## 2022-06-12 MED ORDER — LORAZEPAM 1 MG PO TABS
1.0000 mg | ORAL_TABLET | Freq: Four times a day (QID) | ORAL | 0 refills | Status: DC | PRN
Start: 2022-06-12 — End: 2022-07-09
  Filled 2022-06-12: qty 30, 8d supply, fill #0

## 2022-06-12 MED ORDER — HYDROMORPHONE HCL 8 MG PO TABS
8.0000 mg | ORAL_TABLET | ORAL | 0 refills | Status: DC | PRN
Start: 2022-06-12 — End: 2022-07-02
  Filled 2022-06-12: qty 120, 20d supply, fill #0

## 2022-06-16 ENCOUNTER — Telehealth: Payer: Self-pay

## 2022-06-16 ENCOUNTER — Inpatient Hospital Stay (HOSPITAL_BASED_OUTPATIENT_CLINIC_OR_DEPARTMENT_OTHER): Payer: BC Managed Care – PPO | Admitting: Hematology & Oncology

## 2022-06-16 ENCOUNTER — Inpatient Hospital Stay: Payer: BC Managed Care – PPO

## 2022-06-16 ENCOUNTER — Other Ambulatory Visit (HOSPITAL_BASED_OUTPATIENT_CLINIC_OR_DEPARTMENT_OTHER): Payer: Self-pay

## 2022-06-16 ENCOUNTER — Encounter: Payer: Self-pay | Admitting: Hematology & Oncology

## 2022-06-16 ENCOUNTER — Encounter: Payer: Self-pay | Admitting: Family

## 2022-06-16 VITALS — HR 94

## 2022-06-16 VITALS — BP 118/73 | HR 110 | Temp 100.2°F | Resp 24 | Ht 70.0 in | Wt 253.8 lb

## 2022-06-16 DIAGNOSIS — D509 Iron deficiency anemia, unspecified: Secondary | ICD-10-CM

## 2022-06-16 DIAGNOSIS — C73 Malignant neoplasm of thyroid gland: Secondary | ICD-10-CM

## 2022-06-16 DIAGNOSIS — E291 Testicular hypofunction: Secondary | ICD-10-CM

## 2022-06-16 DIAGNOSIS — D649 Anemia, unspecified: Secondary | ICD-10-CM

## 2022-06-16 DIAGNOSIS — C787 Secondary malignant neoplasm of liver and intrahepatic bile duct: Secondary | ICD-10-CM | POA: Diagnosis not present

## 2022-06-16 DIAGNOSIS — D702 Other drug-induced agranulocytosis: Secondary | ICD-10-CM

## 2022-06-16 LAB — CBC WITH DIFFERENTIAL (CANCER CENTER ONLY)
Abs Immature Granulocytes: 0.07 10*3/uL (ref 0.00–0.07)
Basophils Absolute: 0.1 10*3/uL (ref 0.0–0.1)
Basophils Relative: 1 %
Eosinophils Absolute: 0 10*3/uL (ref 0.0–0.5)
Eosinophils Relative: 0 %
HCT: 29.7 % — ABNORMAL LOW (ref 39.0–52.0)
Hemoglobin: 9.3 g/dL — ABNORMAL LOW (ref 13.0–17.0)
Immature Granulocytes: 1 %
Lymphocytes Relative: 4 %
Lymphs Abs: 0.4 10*3/uL — ABNORMAL LOW (ref 0.7–4.0)
MCH: 28.6 pg (ref 26.0–34.0)
MCHC: 31.3 g/dL (ref 30.0–36.0)
MCV: 91.4 fL (ref 80.0–100.0)
Monocytes Absolute: 2.2 10*3/uL — ABNORMAL HIGH (ref 0.1–1.0)
Monocytes Relative: 18 %
Neutro Abs: 9.3 10*3/uL — ABNORMAL HIGH (ref 1.7–7.7)
Neutrophils Relative %: 76 %
Platelet Count: 220 10*3/uL (ref 150–400)
RBC: 3.25 MIL/uL — ABNORMAL LOW (ref 4.22–5.81)
RDW: 21 % — ABNORMAL HIGH (ref 11.5–15.5)
WBC Count: 12.1 10*3/uL — ABNORMAL HIGH (ref 4.0–10.5)
nRBC: 0 % (ref 0.0–0.2)

## 2022-06-16 LAB — CMP (CANCER CENTER ONLY)
ALT: 12 U/L (ref 0–44)
AST: 33 U/L (ref 15–41)
Albumin: 3.5 g/dL (ref 3.5–5.0)
Alkaline Phosphatase: 425 U/L — ABNORMAL HIGH (ref 38–126)
Anion gap: 10 (ref 5–15)
BUN: 16 mg/dL (ref 6–20)
CO2: 27 mmol/L (ref 22–32)
Calcium: 8.5 mg/dL — ABNORMAL LOW (ref 8.9–10.3)
Chloride: 94 mmol/L — ABNORMAL LOW (ref 98–111)
Creatinine: 0.95 mg/dL (ref 0.61–1.24)
GFR, Estimated: >60 mL/min (ref >60)
Glucose, Bld: 107 mg/dL — ABNORMAL HIGH (ref 70–99)
Potassium: 4.3 mmol/L (ref 3.5–5.1)
Sodium: 131 mmol/L — ABNORMAL LOW (ref 135–145)
Total Bilirubin: 1.2 mg/dL (ref 0.3–1.2)
Total Protein: 6.2 g/dL — ABNORMAL LOW (ref 6.5–8.1)

## 2022-06-16 LAB — IRON AND IRON BINDING CAPACITY (CC-WL,HP ONLY)
Iron: 30 ug/dL — ABNORMAL LOW (ref 45–182)
Saturation Ratios: 13 % — ABNORMAL LOW (ref 17.9–39.5)
TIBC: 234 ug/dL — ABNORMAL LOW (ref 250–450)
UIBC: 204 ug/dL (ref 117–376)

## 2022-06-16 LAB — LACTIC ACID, PLASMA: Lactic Acid, Venous: 2.5 mmol/L (ref 0.5–1.9)

## 2022-06-16 LAB — RETICULOCYTES
Immature Retic Fract: 32.4 % — ABNORMAL HIGH (ref 2.3–15.9)
RBC.: 3.26 MIL/uL — ABNORMAL LOW (ref 4.22–5.81)
Retic Count, Absolute: 99.8 10*3/uL (ref 19.0–186.0)
Retic Ct Pct: 3.1 % (ref 0.4–3.1)

## 2022-06-16 LAB — SAMPLE TO BLOOD BANK

## 2022-06-16 LAB — FERRITIN: Ferritin: 1445 ng/mL — ABNORMAL HIGH (ref 24–336)

## 2022-06-16 LAB — LACTATE DEHYDROGENASE: LDH: 535 U/L — ABNORMAL HIGH (ref 98–192)

## 2022-06-16 MED ORDER — ZOLEDRONIC ACID 4 MG/100ML IV SOLN
4.0000 mg | Freq: Once | INTRAVENOUS | Status: AC
  Administered 2022-06-16: 4 mg via INTRAVENOUS
  Filled 2022-06-16: qty 100

## 2022-06-16 MED ORDER — PALONOSETRON HCL INJECTION 0.25 MG/5ML
0.2500 mg | Freq: Once | INTRAVENOUS | Status: AC
  Administered 2022-06-16: 0.25 mg via INTRAVENOUS
  Filled 2022-06-16: qty 5

## 2022-06-16 MED ORDER — ALTEPLASE 2 MG IJ SOLR: 2.0000 mg | Freq: Once | INTRAMUSCULAR | Status: DC | PRN

## 2022-06-16 MED ORDER — SODIUM CHLORIDE 0.9% FLUSH
10.0000 mL | INTRAVENOUS | Status: DC | PRN
  Administered 2022-06-16: 10 mL

## 2022-06-16 MED ORDER — POTASSIUM CHLORIDE IN NACL 20-0.9 MEQ/L-% IV SOLN
Freq: Once | INTRAVENOUS | Status: AC
  Filled 2022-06-16: qty 1000

## 2022-06-16 MED ORDER — SODIUM CHLORIDE 0.9% FLUSH: 3.0000 mL | Freq: Once | INTRAVENOUS | Status: DC | PRN

## 2022-06-16 MED ORDER — SODIUM CHLORIDE 0.9 % IV SOLN
10.0000 mg | Freq: Once | INTRAVENOUS | Status: AC
  Administered 2022-06-16: 10 mg via INTRAVENOUS
  Filled 2022-06-16: qty 10

## 2022-06-16 MED ORDER — SODIUM CHLORIDE 0.9 % IV SOLN
150.0000 mg | Freq: Once | INTRAVENOUS | Status: AC
  Administered 2022-06-16: 150 mg via INTRAVENOUS
  Filled 2022-06-16: qty 150

## 2022-06-16 MED ORDER — HEPARIN SOD (PORK) LOCK FLUSH 100 UNIT/ML IV SOLN
500.0000 [IU] | Freq: Once | INTRAVENOUS | Status: AC | PRN
  Administered 2022-06-16: 500 [IU]

## 2022-06-16 MED ORDER — SODIUM CHLORIDE 0.9 % IV SOLN
500.0000 mg/m2 | Freq: Once | INTRAVENOUS | Status: AC
  Administered 2022-06-16: 1220 mg via INTRAVENOUS
  Filled 2022-06-16: qty 43

## 2022-06-16 MED ORDER — DOXORUBICIN HCL CHEMO IV INJECTION 2 MG/ML
45.0000 mg/m2 | Freq: Once | INTRAVENOUS | Status: AC
  Administered 2022-06-16: 110 mg via INTRAVENOUS
  Filled 2022-06-16: qty 55

## 2022-06-16 MED ORDER — SODIUM CHLORIDE 0.9 % IV SOLN: Freq: Once | INTRAVENOUS | Status: AC

## 2022-06-16 MED ORDER — HEPARIN SOD (PORK) LOCK FLUSH 100 UNIT/ML IV SOLN: 250.0000 [IU] | Freq: Once | INTRAVENOUS | Status: DC | PRN

## 2022-06-16 MED ORDER — SODIUM CHLORIDE 0.9% FLUSH: 10.0000 mL | Freq: Once | INTRAVENOUS | Status: DC

## 2022-06-16 MED ORDER — TESTOSTERONE CYPIONATE 200 MG/ML IM SOLN
400.0000 mg | INTRAMUSCULAR | Status: DC
  Administered 2022-06-16: 400 mg via INTRAMUSCULAR
  Filled 2022-06-16: qty 2

## 2022-06-16 MED ORDER — MAGNESIUM SULFATE 2 GM/50ML IV SOLN
2.0000 g | Freq: Once | INTRAVENOUS | Status: AC
  Administered 2022-06-16: 2 g via INTRAVENOUS
  Filled 2022-06-16: qty 50

## 2022-06-16 MED ORDER — SODIUM CHLORIDE 0.9 % IV SOLN
45.0000 mg/m2 | Freq: Once | INTRAVENOUS | Status: AC
  Administered 2022-06-16: 100 mg via INTRAVENOUS
  Filled 2022-06-16: qty 100

## 2022-06-16 NOTE — Telephone Encounter (Signed)
Critical lactic acid of 2.5 received from lab, MD aware

## 2022-06-16 NOTE — Progress Notes (Signed)
Correct Ca = 8.9  Brian [name] [place], [place], BCPS, BCOP 06/16/2022 9:48 AM

## 2022-06-16 NOTE — Patient Instructions (Signed)

## 2022-06-16 NOTE — Progress Notes (Signed)
Hematology and Oncology Follow Up Visit  Brian [name] [id number] 1976-02-06 47 y.o. 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 + Bilateral pulmonary emboli without right heart strain  DVT left popliteal vein    Past Therapy: Cabometyx 40 mg po q day -- start on 04/17/2019 - d/c on 07/17/2019 for progression Lenvima 24 mg po q day -- on hold due to proteinuria Taxotere/CDDP -- s/p cycle 4 - started on 09/13/2019 Adriamycin weekly 2 weeks on 1 week off - s/p cycle 8 --d/c on 04/16/2020 due to  progression Gavreto 400 mg po q day -- started on 05/17/2020  -- d/c on 06/16/2020 Pembrolizumab 200 mg IV q 3 week -- s/p cycle #8 - start on 06/26/2020 -- d/c on 01/16/2021 due to progression Vinblastine q week x 4 weeks on / 1 week off -- s/p  cycle #2 -- start on 02/24/2021 -- d/c on 05/20/2021 XRT for LEFT shoulder met -completed on 06/22/2021   Current Therapy:     FOLFOX -- s/p cycle #10 -- start on 07/08/2021 -- d/c on 05/12/2022 CDDP/Adria/Cytoxan -- s/p cycle # 1 -- start on 05/19/2022 Zometa 4 mg IV q 2 months - next dose on 05/2022 Eliquis 5 mg po BID Testosterone 400 mg IM q 2 weeks   Interim History:  [name] is here today for follow-up.  He did have a tough time with the first cycle of treatment.  We will have to decrease the dose a little bit.  It is hard to say if he has responded.  His LFTs still are on the high side.  He is having some pain in the right upper quadrant of his abdomen.  He has had no problems with bowels or bladder.  He has had no cough.  There has been no cough or increased shortness of breath.  There has been a little bit of swelling in the legs.  He has had no bleeding.  There has been no fever.  He has had no mouth sores.  Currently, I would have to say that his performance status is probably ECOG 1.    Medications:  Allergies as of 06/16/2022       Reactions    Dextromethorphan-guaifenesin Other (See Comments)   Irregular heartbeat   Doxycycline Anaphylaxis, Nausea And Vomiting, Rash, Other (See Comments)   "heart arrythmia" and "dyspepsia" (only oral doxycycline causes reaction)   Gadobutrol Hives, Other (See Comments)   Patient had MRI scan at [facility]. Patient called one hour after he left imaging facility to report two "blisters" that came up on "back" of lip.   Gadolinium Derivatives Hives   Patient had MRI scan at [facility]. Patient called one hour after he left imaging facility to report two "blisters" that came up on "back" of lip.    Guaifenesin Palpitations      Ibuprofen Hives, Itching   Lisinopril Other (See Comments)   Angioedema   Pantoprazole Itching   Tramadol Hives, Itching   Nexavar [sorafenib] Hives   Barium Rash   Developed redness around neck after drinking 1st bottle of Barocat; pt was given Benedryl by ED Mds to be "on the safe side" before drinking 2nd bottle   Chlorhexidine Hives        Medication List        Accurate as of June 16, 2022  8:31 AM. If you have any questions, ask your nurse or doctor.  baclofen 10 MG tablet Commonly known as: LIORESAL Take 1 tablet (10 mg total) by mouth 4 (four) times daily as needed for muscle spasms.   chlorproMAZINE 25 MG tablet Commonly known as: THORAZINE Take 1 tablet (25 mg total) by mouth 4 (four) times daily as needed.   cyclobenzaprine 10 MG tablet Commonly known as: FLEXERIL Take 1 tablet (10 mg total) by mouth 3 (three) times daily as needed for muscle spasms.   dexamethasone 4 MG tablet Commonly known as: DECADRON Take 2 tablets (8 mg total) by mouth daily for 3 days starting the day after cisplatin chemotherapy. Take with food.   diphenhydrAMINE 25 mg capsule Commonly known as: BENADRYL Take 2 capsules (50 mg total) by mouth once for 1 dose.   Eliquis 5 MG Tabs tablet Generic drug: apixaban TAKE 1 TABLET (5 MG TOTAL) BY  MOUTH 2 (TWO) TIMES DAILY.   EPINEPHrine 0.3 mg/0.3 mL Soaj injection Commonly known as: EpiPen 2-Pak USE AS DIRECTED FOR LIFE THREATENING ALLERGIC REACTIONS   famotidine 40 MG tablet Commonly known as: PEPCID Take 1 tablet (40 mg total) by mouth 2 (two) times daily.   Fusion Plus Caps Take 1 capsule by mouth daily.   HYDROmorphone 8 MG tablet Commonly known as: DILAUDID Take 1 tablet (8 mg total) by mouth every 4 (four) hours as needed for severe pain.   levothyroxine 200 MCG tablet Commonly known as: SYNTHROID Take one tablet (200 mcg dose) by mouth daily. Total:  288 mcg/day.   LORazepam 1 MG tablet Commonly known as: ATIVAN Take 1 tablet (1 mg total) by mouth every 6 (six) hours as needed for anxiety (for nausea and vomiting).   magic mouthwash Soln Swish and spit 15 mls by mouth every four hours as needed for mouth pain.   methylphenidate 10 MG tablet Commonly known as: RITALIN Take 1 tablet (10 mg total) by mouth 2 (two) times daily.   metoCLOPramide 10 MG tablet Commonly known as: REGLAN Take 1 tablet (10 mg total) by mouth every 6 (six) hours as needed for nausea or vomiting.   metolazone 5 MG tablet Commonly known as: ZAROXOLYN Take 5 mg by mouth daily.   metoprolol tartrate 50 MG tablet Commonly known as: Lopressor Take 1 tablet (50 mg total) by mouth 2 (two) times daily.   multivitamin capsule Take 1 capsule by mouth daily.   OLANZapine 10 MG tablet Commonly known as: ZYPREXA TAKE 1 TABLET (10 MG TOTAL) BY MOUTH AT BEDTIME.   ondansetron 8 MG tablet Commonly known as: Zofran Take 1 tablet (8 mg total) by mouth every 8 (eight) hours as needed for nausea or vomiting. Start on the third day after cisplatin.   PEG 3350 17 GM/SCOOP Powd Mix 17 grams (1 Capful) in 8 ounces of liquid and drink by mouth daily as needed.   potassium chloride SA 20 MEQ tablet Commonly known as: KLOR-CON M Take 1 tablet (20 mEq total) by mouth daily.   prednisoLONE  acetate 1 % ophthalmic suspension Commonly known as: PRED FORTE   predniSONE 50 MG tablet Commonly known as: DELTASONE TAKE 1 TABLET BY MOUTH 13 HOURS BEFORE CT SCAN, 1 TABLET 7 HOURS BEFORE CT SCAN, AND 1 TABLET 1 HOUR BEFORE CT SCAN   prochlorperazine 10 MG tablet Commonly known as: COMPAZINE Take 1 tablet (10 mg total) by mouth every 6 (six) hours as needed (Nausea or vomiting).   promethazine 25 MG tablet Commonly known as: PHENERGAN Take 1 tablet (25 mg total) by mouth  every 6 (six) hours as needed for nausea and vomiting.   senna-docusate 8.6-50 MG tablet Commonly known as: Senokot-S Take 1 tablet by mouth at bedtime as needed for mild constipation.   tamsulosin 0.4 MG Caps capsule Commonly known as: Flomax Take 1 capsule (0.4 mg total) by mouth daily after supper.   temazepam 30 MG capsule Commonly known as: RESTORIL Take 1 capsule (30 mg total) by mouth at bedtime as needed for sleep.        Allergies:  Allergies  Allergen Reactions   Dextromethorphan-Guaifenesin Other (See Comments)    Irregular heartbeat    Doxycycline Anaphylaxis, Nausea And Vomiting, Rash and Other (See Comments)    "heart arrythmia" and "dyspepsia" (only oral doxycycline causes reaction)   Gadobutrol Hives and Other (See Comments)    Patient had MRI scan at [facility]. Patient called one hour after he left imaging facility to report two "blisters" that came up on "back" of lip.   Gadolinium Derivatives Hives    Patient had MRI scan at [facility]. Patient called one hour after he left imaging facility to report two "blisters" that came up on "back" of lip.    Guaifenesin Palpitations        Ibuprofen Hives and Itching   Lisinopril Other (See Comments)    Angioedema   Pantoprazole Itching   Tramadol Hives and Itching   Nexavar [Sorafenib] Hives   Barium Rash    Developed redness around neck after drinking 1st bottle of Barocat; pt was given Benedryl by ED Mds to be "on  the safe side" before drinking 2nd bottle   Chlorhexidine Hives    Past Medical History, Surgical history, Social history, and Family History were reviewed and updated.  Review of Systems: Review of Systems  Constitutional:  Positive for malaise/fatigue.  HENT: Negative.    Eyes: Negative.   Respiratory: Negative.    Cardiovascular:  Positive for leg swelling.  Gastrointestinal:  Positive for abdominal pain and nausea. Negative for vomiting.  Genitourinary: Negative.   Musculoskeletal:  Positive for myalgias.  Skin: Negative.   Neurological: Negative.   Endo/Heme/Allergies: Negative.   Psychiatric/Behavioral: Negative.       Physical Exam:  height is 5\' 10"  (1.778 m) and weight is 253 lb 12.8 oz (115.1 kg). His oral temperature is 100.2 F (37.9 C). His blood pressure is 118/73 and his pulse is 110 (abnormal). His respiration is 24 (abnormal) and oxygen saturation is 97%.   Wt Readings from Last 3 Encounters:  06/16/22 253 lb 12.8 oz (115.1 kg)  06/05/22 259 lb 1.9 oz (117.5 kg)  05/19/22 246 lb 1.9 oz (111.6 kg)   Physical Exam Vitals reviewed.  HENT:     Head: Normocephalic and atraumatic.  Eyes:     Pupils: Pupils are equal, round, and reactive to light.  Cardiovascular:     Rate and Rhythm: Normal rate and regular rhythm.     Heart sounds: Normal heart sounds.  Pulmonary:     Effort: Pulmonary effort is normal.     Breath sounds: Normal breath sounds.  Abdominal:     General: Bowel sounds are normal.     Palpations: Abdomen is soft.  Musculoskeletal:        General: No tenderness or deformity. Normal range of motion.     Cervical back: Normal range of motion.  Lymphadenopathy:     Cervical: No cervical adenopathy.  Skin:    General: Skin is warm and dry.     Findings:  No erythema or rash.  Neurological:     Mental Status: He is alert and oriented to person, place, and time.  Psychiatric:        Behavior: Behavior normal.        Thought Content: Thought  content normal.        Judgment: Judgment normal.      Lab Results  Component Value Date   WBC 12.1 (H) 06/16/2022   HGB 9.3 (L) 06/16/2022   HCT 29.7 (L) 06/16/2022   MCV 91.4 06/16/2022   PLT 220 06/16/2022   Lab Results  Component Value Date   FERRITIN 1,408 (H) 06/05/2022   IRON 43 (L) 06/05/2022   TIBC 192 (L) 06/05/2022   UIBC 149 06/05/2022   IRONPCTSAT 22 06/05/2022   Lab Results  Component Value Date   RETICCTPCT 3.1 06/16/2022   RBC 3.26 (L) 06/16/2022   No results found for: "KPAFRELGTCHN", "LAMBDASER", "KAPLAMBRATIO" No results found for: "IGGSERUM", "IGA", "IGMSERUM" No results found for: "TOTALPROTELP", "ALBUMINELP", "A1GS", "A2GS", "BETS", "BETA2SER", "GAMS", "MSPIKE", "SPEI"   Chemistry      Component Value Date/Time   NA 131 (L) 06/16/2022 0756   NA 141 02/06/2019 1210   K 4.3 06/16/2022 0756   CL 94 (L) 06/16/2022 0756   CO2 27 06/16/2022 0756   BUN 16 06/16/2022 0756   BUN 8 02/06/2019 1210   CREATININE 0.95 06/16/2022 0756      Component Value Date/Time   CALCIUM 8.5 (L) 06/16/2022 0756   ALKPHOS 425 (H) 06/16/2022 0756   AST 33 06/16/2022 0756   ALT 12 06/16/2022 0756   BILITOT 1.2 06/16/2022 0756       Impression and Plan: [name] is a very pleasant 47 yo caucasian gentleman with an 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    This really is his last line of chemotherapy.  I realize that this is quite aggressive.  Again, we will have to decrease his dose is a little bit so that he can tolerate treatment a little bit better.  I will have to set him up with scans after this cycle so we can see how everything looks.  Hopefully, we will see a response.  I think that if we do not see a response, I am just not sure what else we can utilize for him.  He has been through several lines of therapy for this Hurthle cell cancer.  We will go ahead and get the scans and a couple weeks or so.  Will plan to see him back  in about 3-4 weeks. Marland Kitchen   Josph Macho, MD 4/30/20248:31 AM

## 2022-06-16 NOTE — Progress Notes (Signed)
Ok to proceed without mag level today per Dr  Myna Hidalgo. dph

## 2022-06-16 NOTE — Patient Instructions (Signed)
[facility]  Discharge Instructions: Thank you for choosing [facility] to provide your oncology and hematology care.   If you have a lab appointment with the Cancer Center, please go directly to the Cancer Center and check in at the registration area.  Wear comfortable clothing and clothing appropriate for easy access to any Portacath or PICC line.   We strive to give you quality time with your provider. You may need to reschedule your appointment if you arrive late (15 or more minutes).  Arriving late affects you and other patients whose appointments are after yours.  Also, if you miss three or more appointments without notifying the office, you may be dismissed from the clinic at the provider's discretion.      For prescription refill requests, have your pharmacy contact our office and allow 72 hours for refills to be completed.    Today you received the following chemotherapy and/or immunotherapy agents Adriamycin, Cytoxan, Cisplatin, Zometa      To help prevent nausea and vomiting after your treatment, we encourage you to take your nausea medication as directed.  BELOW ARE SYMPTOMS THAT SHOULD BE REPORTED IMMEDIATELY: *FEVER GREATER THAN 100.4 F (38 C) OR HIGHER *CHILLS OR SWEATING *NAUSEA AND VOMITING THAT IS NOT CONTROLLED WITH YOUR NAUSEA MEDICATION *UNUSUAL SHORTNESS OF BREATH *UNUSUAL BRUISING OR BLEEDING *URINARY PROBLEMS (pain or burning when urinating, or frequent urination) *BOWEL PROBLEMS (unusual diarrhea, constipation, pain near the anus) TENDERNESS IN MOUTH AND THROAT WITH OR WITHOUT PRESENCE OF ULCERS (sore throat, sores in mouth, or a toothache) UNUSUAL RASH, SWELLING OR PAIN  UNUSUAL VAGINAL DISCHARGE OR ITCHING   Items with * indicate a potential emergency and should be followed up as soon as possible or go to the Emergency Department if any problems should occur.  Please show the CHEMOTHERAPY ALERT CARD or  IMMUNOTHERAPY ALERT CARD at check-in to the Emergency Department and triage nurse. Should you have questions after your visit or need to cancel or reschedule your appointment, please contact [facility] [place]  [phone] and follow the prompts.  Office hours are 8:00 a.m. to 4:30 p.m. Monday - Friday. Please note that voicemails left after 4:00 p.m. may not be returned until the following business day.  We are closed weekends and major holidays. You have access to a nurse at all times for urgent questions. Please call the main number to the clinic [phone] and follow the prompts.  For any non-urgent questions, you may also contact your provider using MyChart. We now offer e-Visits for anyone 10 and older to request care online for non-urgent symptoms. For details visit [email or web address].   Also download the MyChart app! Go to the app store, search "MyChart", open the app, select [place], and log in with your MyChart username and password.

## 2022-06-19 ENCOUNTER — Other Ambulatory Visit: Payer: Self-pay

## 2022-06-24 ENCOUNTER — Telehealth: Payer: Self-pay

## 2022-06-24 NOTE — Telephone Encounter (Signed)
Received VM from pt wishing to move 5/29 appts out to the first week of June so that he can go out of town with his family. OK per Dr Myna Hidalgo. Message sent to scheduling. dph

## 2022-06-25 ENCOUNTER — Telehealth: Payer: Self-pay | Admitting: Dietician

## 2022-06-25 NOTE — Telephone Encounter (Signed)
Patient screened on MST. Second attempt to reach. Provided my cell# on voice mail to return call to set up a nutrition consult.  Cyndi Duy Lemming, RDN, LDN Registered Dietitian,  Cancer Center Part Time Remote (Usual office hours: Tuesday-Thursday) Cell: [phone]   

## 2022-06-26 ENCOUNTER — Telehealth: Payer: Self-pay | Admitting: *Deleted

## 2022-06-26 ENCOUNTER — Other Ambulatory Visit: Payer: Self-pay | Admitting: *Deleted

## 2022-06-26 ENCOUNTER — Inpatient Hospital Stay: Payer: BC Managed Care – PPO | Attending: Hematology & Oncology

## 2022-06-26 ENCOUNTER — Inpatient Hospital Stay: Payer: BC Managed Care – PPO

## 2022-06-26 ENCOUNTER — Other Ambulatory Visit: Payer: Self-pay

## 2022-06-26 VITALS — BP 104/58 | HR 103 | Temp 97.8°F | Resp 18

## 2022-06-26 DIAGNOSIS — C787 Secondary malignant neoplasm of liver and intrahepatic bile duct: Secondary | ICD-10-CM | POA: Diagnosis present

## 2022-06-26 DIAGNOSIS — Z5189 Encounter for other specified aftercare: Secondary | ICD-10-CM | POA: Insufficient documentation

## 2022-06-26 DIAGNOSIS — C73 Malignant neoplasm of thyroid gland: Secondary | ICD-10-CM

## 2022-06-26 DIAGNOSIS — D6481 Anemia due to antineoplastic chemotherapy: Secondary | ICD-10-CM | POA: Diagnosis not present

## 2022-06-26 DIAGNOSIS — R110 Nausea: Secondary | ICD-10-CM

## 2022-06-26 DIAGNOSIS — T451X5A Adverse effect of antineoplastic and immunosuppressive drugs, initial encounter: Secondary | ICD-10-CM | POA: Insufficient documentation

## 2022-06-26 DIAGNOSIS — D509 Iron deficiency anemia, unspecified: Secondary | ICD-10-CM

## 2022-06-26 DIAGNOSIS — C801 Malignant (primary) neoplasm, unspecified: Secondary | ICD-10-CM

## 2022-06-26 DIAGNOSIS — C799 Secondary malignant neoplasm of unspecified site: Secondary | ICD-10-CM

## 2022-06-26 DIAGNOSIS — D649 Anemia, unspecified: Secondary | ICD-10-CM

## 2022-06-26 DIAGNOSIS — E291 Testicular hypofunction: Secondary | ICD-10-CM

## 2022-06-26 LAB — CBC WITH DIFFERENTIAL (CANCER CENTER ONLY)
Abs Immature Granulocytes: 0 10*3/uL (ref 0.00–0.07)
Basophils Absolute: 0 10*3/uL (ref 0.0–0.1)
Basophils Relative: 2 %
Eosinophils Absolute: 0 10*3/uL (ref 0.0–0.5)
Eosinophils Relative: 0 %
HCT: 22.9 % — ABNORMAL LOW (ref 39.0–52.0)
Hemoglobin: 7.3 g/dL — ABNORMAL LOW (ref 13.0–17.0)
Immature Granulocytes: 0 %
Lymphocytes Relative: 19 %
Lymphs Abs: 0.1 10*3/uL — ABNORMAL LOW (ref 0.7–4.0)
MCH: 28.3 pg (ref 26.0–34.0)
MCHC: 31.9 g/dL (ref 30.0–36.0)
MCV: 88.8 fL (ref 80.0–100.0)
Monocytes Absolute: 0.1 10*3/uL (ref 0.1–1.0)
Monocytes Relative: 13 %
Neutro Abs: 0.3 10*3/uL — CL (ref 1.7–7.7)
Neutrophils Relative %: 66 %
Platelet Count: 52 10*3/uL — ABNORMAL LOW (ref 150–400)
RBC: 2.58 MIL/uL — ABNORMAL LOW (ref 4.22–5.81)
RDW: 19.3 % — ABNORMAL HIGH (ref 11.5–15.5)
Smear Review: NORMAL
WBC Count: 0.5 10*3/uL — CL (ref 4.0–10.5)
nRBC: 0 % (ref 0.0–0.2)

## 2022-06-26 LAB — CMP (CANCER CENTER ONLY)
ALT: 11 U/L (ref 0–44)
AST: 24 U/L (ref 15–41)
Albumin: 3.6 g/dL (ref 3.5–5.0)
Alkaline Phosphatase: 328 U/L — ABNORMAL HIGH (ref 38–126)
Anion gap: 13 (ref 5–15)
BUN: 15 mg/dL (ref 6–20)
CO2: 26 mmol/L (ref 22–32)
Calcium: 9.7 mg/dL (ref 8.9–10.3)
Chloride: 93 mmol/L — ABNORMAL LOW (ref 98–111)
Creatinine: 0.84 mg/dL (ref 0.61–1.24)
GFR, Estimated: >60 mL/min (ref >60)
Glucose, Bld: 103 mg/dL — ABNORMAL HIGH (ref 70–99)
Potassium: 4.2 mmol/L (ref 3.5–5.1)
Sodium: 132 mmol/L — ABNORMAL LOW (ref 135–145)
Total Bilirubin: 1.4 mg/dL — ABNORMAL HIGH (ref 0.3–1.2)
Total Protein: 6.5 g/dL (ref 6.5–8.1)

## 2022-06-26 LAB — TYPE AND SCREEN

## 2022-06-26 LAB — PREPARE RBC (CROSSMATCH)

## 2022-06-26 LAB — SAMPLE TO BLOOD BANK

## 2022-06-26 MED ORDER — SODIUM CHLORIDE 0.9% IV SOLUTION
250.0000 mL | Freq: Once | INTRAVENOUS | Status: AC
  Administered 2022-06-26: 250 mL via INTRAVENOUS

## 2022-06-26 MED ORDER — HYDROMORPHONE HCL 1 MG/ML IJ SOLN
2.0000 mg | INTRAMUSCULAR | Status: DC | PRN
  Administered 2022-06-26: 2 mg via INTRAVENOUS
  Filled 2022-06-26: qty 2

## 2022-06-26 MED ORDER — SODIUM CHLORIDE 0.9 % IV SOLN
200.0000 mg | Freq: Once | INTRAVENOUS | Status: AC
  Administered 2022-06-26: 200 mg via INTRAVENOUS
  Filled 2022-06-26: qty 200

## 2022-06-26 MED ORDER — ACETAMINOPHEN 325 MG PO TABS
650.0000 mg | ORAL_TABLET | Freq: Once | ORAL | Status: AC
  Administered 2022-06-26: 650 mg via ORAL
  Filled 2022-06-26: qty 2

## 2022-06-26 MED ORDER — SODIUM CHLORIDE 0.9 % IV SOLN: INTRAVENOUS | Status: DC

## 2022-06-26 MED ORDER — METOCLOPRAMIDE HCL 5 MG/ML IJ SOLN
20.0000 mg | Freq: Once | INTRAVENOUS | Status: AC
  Administered 2022-06-26: 20 mg via INTRAVENOUS
  Filled 2022-06-26: qty 4

## 2022-06-26 MED ORDER — PEGFILGRASTIM-CBQV 6 MG/0.6ML ~~LOC~~ SOSY
6.0000 mg | PREFILLED_SYRINGE | Freq: Once | SUBCUTANEOUS | Status: AC
  Administered 2022-06-26: 6 mg via SUBCUTANEOUS
  Filled 2022-06-26: qty 0.6

## 2022-06-26 MED ORDER — FUROSEMIDE 10 MG/ML IJ SOLN: 20.0000 mg | Freq: Once | INTRAMUSCULAR | Status: DC

## 2022-06-26 MED ORDER — HYDROMORPHONE HCL 1 MG/ML IJ SOLN
2.0000 mg | Freq: Once | INTRAMUSCULAR | Status: AC
  Administered 2022-06-26: 2 mg via INTRAVENOUS
  Filled 2022-06-26: qty 2

## 2022-06-26 MED ORDER — SODIUM CHLORIDE 0.9% FLUSH
10.0000 mL | INTRAVENOUS | Status: AC | PRN
  Administered 2022-06-26: 10 mL

## 2022-06-26 MED ORDER — DIPHENHYDRAMINE HCL 25 MG PO CAPS
25.0000 mg | ORAL_CAPSULE | Freq: Once | ORAL | Status: AC
  Administered 2022-06-26: 25 mg via ORAL
  Filled 2022-06-26: qty 1

## 2022-06-26 MED ORDER — HEPARIN SOD (PORK) LOCK FLUSH 100 UNIT/ML IV SOLN
250.0000 [IU] | INTRAVENOUS | Status: AC | PRN
  Administered 2022-06-26: 500 [IU]

## 2022-06-26 NOTE — Telephone Encounter (Signed)
Call received from patient stating that he feels as though he is dehydrated and needs IVF's. Pt instructed to come in for labs and IVF's per order of Dr. Myna Hidalgo.  Message sent to scheduling.

## 2022-06-26 NOTE — Telephone Encounter (Signed)
Dr. Myna Hidalgo notified of HGB-7.3, WBC-0.5, ANC-0.3 and PLTS-52.  Order received from Dr. Myna Hidalgo for pt to get one unit of PRBC's Venofer and Udenyca today and for pt to return to office on Monday, 06/29/22 for labs/port flush and possible IVF's. Message sent to scheduling.

## 2022-06-26 NOTE — Patient Instructions (Signed)

## 2022-06-26 NOTE — Patient Instructions (Signed)

## 2022-06-28 LAB — BPAM RBC

## 2022-06-28 LAB — TYPE AND SCREEN: Antibody Screen: NEGATIVE

## 2022-06-29 ENCOUNTER — Other Ambulatory Visit: Payer: Self-pay | Admitting: *Deleted

## 2022-06-29 ENCOUNTER — Telehealth: Payer: Self-pay

## 2022-06-29 ENCOUNTER — Inpatient Hospital Stay: Payer: BC Managed Care – PPO

## 2022-06-29 ENCOUNTER — Other Ambulatory Visit: Payer: Self-pay

## 2022-06-29 VITALS — BP 102/83 | HR 93 | Temp 97.9°F | Resp 20

## 2022-06-29 DIAGNOSIS — C787 Secondary malignant neoplasm of liver and intrahepatic bile duct: Secondary | ICD-10-CM

## 2022-06-29 DIAGNOSIS — C73 Malignant neoplasm of thyroid gland: Secondary | ICD-10-CM

## 2022-06-29 DIAGNOSIS — E291 Testicular hypofunction: Secondary | ICD-10-CM

## 2022-06-29 DIAGNOSIS — C799 Secondary malignant neoplasm of unspecified site: Secondary | ICD-10-CM

## 2022-06-29 DIAGNOSIS — D509 Iron deficiency anemia, unspecified: Secondary | ICD-10-CM

## 2022-06-29 DIAGNOSIS — D649 Anemia, unspecified: Secondary | ICD-10-CM

## 2022-06-29 DIAGNOSIS — C801 Malignant (primary) neoplasm, unspecified: Secondary | ICD-10-CM

## 2022-06-29 DIAGNOSIS — R110 Nausea: Secondary | ICD-10-CM

## 2022-06-29 LAB — TYPE AND SCREEN
Unit division: 0
Unit division: 0

## 2022-06-29 LAB — CBC WITH DIFFERENTIAL (CANCER CENTER ONLY)
Abs Immature Granulocytes: 0.1 10*3/uL — ABNORMAL HIGH (ref 0.00–0.07)
Basophils Absolute: 0 10*3/uL (ref 0.0–0.1)
Basophils Relative: 0 %
Eosinophils Absolute: 0 10*3/uL (ref 0.0–0.5)
Eosinophils Relative: 2 %
HCT: 28.6 % — ABNORMAL LOW (ref 39.0–52.0)
Hemoglobin: 8.9 g/dL — ABNORMAL LOW (ref 13.0–17.0)
Lymphocytes Relative: 46 %
Lymphs Abs: 0.4 10*3/uL — ABNORMAL LOW (ref 0.7–4.0)
MCH: 28.7 pg (ref 26.0–34.0)
MCHC: 31.1 g/dL (ref 30.0–36.0)
MCV: 92.3 fL (ref 80.0–100.0)
Metamyelocytes Relative: 4 %
Monocytes Absolute: 0.3 10*3/uL (ref 0.1–1.0)
Monocytes Relative: 34 %
Myelocytes: 2 %
Neutro Abs: 0.1 10*3/uL — CL (ref 1.7–7.7)
Neutrophils Relative %: 12 %
Platelet Count: 106 10*3/uL — ABNORMAL LOW (ref 150–400)
RBC: 3.1 MIL/uL — ABNORMAL LOW (ref 4.22–5.81)
RDW: 19.7 % — ABNORMAL HIGH (ref 11.5–15.5)
Smear Review: NORMAL
WBC Count: 0.9 10*3/uL — CL (ref 4.0–10.5)
nRBC: 3.4 % — ABNORMAL HIGH (ref 0.0–0.2)
nRBC: 8 /100 WBC — ABNORMAL HIGH

## 2022-06-29 LAB — CMP (CANCER CENTER ONLY)
ALT: 12 U/L (ref 0–44)
AST: 23 U/L (ref 15–41)
Albumin: 3.7 g/dL (ref 3.5–5.0)
Alkaline Phosphatase: 405 U/L — ABNORMAL HIGH (ref 38–126)
Anion gap: 14 (ref 5–15)
BUN: 15 mg/dL (ref 6–20)
CO2: 27 mmol/L (ref 22–32)
Calcium: 10.1 mg/dL (ref 8.9–10.3)
Chloride: 96 mmol/L — ABNORMAL LOW (ref 98–111)
Creatinine: 0.89 mg/dL (ref 0.61–1.24)
GFR, Estimated: >60 mL/min (ref >60)
Glucose, Bld: 100 mg/dL — ABNORMAL HIGH (ref 70–99)
Potassium: 4.2 mmol/L (ref 3.5–5.1)
Sodium: 137 mmol/L (ref 135–145)
Total Bilirubin: 1 mg/dL (ref 0.3–1.2)
Total Protein: 6.5 g/dL (ref 6.5–8.1)

## 2022-06-29 LAB — BPAM RBC: Blood Product Expiration Date: 202406012359

## 2022-06-29 LAB — PREPARE RBC (CROSSMATCH)

## 2022-06-29 MED ORDER — SODIUM CHLORIDE 0.9% FLUSH: 10.0000 mL | INTRAVENOUS | Status: DC | PRN

## 2022-06-29 MED ORDER — SODIUM CHLORIDE 0.9% FLUSH
10.0000 mL | INTRAVENOUS | Status: DC | PRN
  Administered 2022-06-29: 10 mL via INTRAVENOUS

## 2022-06-29 MED ORDER — HEPARIN SOD (PORK) LOCK FLUSH 100 UNIT/ML IV SOLN: 500.0000 [IU] | Freq: Every day | INTRAVENOUS | Status: DC | PRN

## 2022-06-29 MED ORDER — SODIUM CHLORIDE 0.9 % IV SOLN: 8.0000 mg | Freq: Once | INTRAVENOUS | Status: DC

## 2022-06-29 MED ORDER — FUROSEMIDE 10 MG/ML IJ SOLN: 20.0000 mg | Freq: Once | INTRAMUSCULAR | Status: DC

## 2022-06-29 MED ORDER — ONDANSETRON HCL 4 MG/2ML IJ SOLN
8.0000 mg | Freq: Once | INTRAMUSCULAR | Status: AC
  Administered 2022-06-29: 8 mg via INTRAVENOUS
  Filled 2022-06-29: qty 4

## 2022-06-29 MED ORDER — DIPHENHYDRAMINE HCL 25 MG PO CAPS
25.0000 mg | ORAL_CAPSULE | Freq: Once | ORAL | Status: AC
  Administered 2022-06-29: 25 mg via ORAL
  Filled 2022-06-29: qty 1

## 2022-06-29 MED ORDER — SODIUM CHLORIDE 0.9 % IV SOLN: INTRAVENOUS | Status: DC

## 2022-06-29 MED ORDER — PEGFILGRASTIM-CBQV 6 MG/0.6ML ~~LOC~~ SOSY: 6.0000 mg | PREFILLED_SYRINGE | Freq: Once | SUBCUTANEOUS | Status: DC

## 2022-06-29 MED ORDER — HYDROMORPHONE HCL 1 MG/ML IJ SOLN
2.0000 mg | Freq: Once | INTRAMUSCULAR | Status: AC
  Administered 2022-06-29: 2 mg via INTRAVENOUS
  Filled 2022-06-29: qty 2

## 2022-06-29 MED ORDER — HEPARIN SOD (PORK) LOCK FLUSH 100 UNIT/ML IV SOLN
500.0000 [IU] | Freq: Once | INTRAVENOUS | Status: AC
  Administered 2022-06-29: 500 [IU] via INTRAVENOUS

## 2022-06-29 MED ORDER — SODIUM CHLORIDE 0.9% IV SOLUTION
250.0000 mL | Freq: Once | INTRAVENOUS | Status: AC
  Administered 2022-06-29: 250 mL via INTRAVENOUS

## 2022-06-29 MED ORDER — ACETAMINOPHEN 325 MG PO TABS
650.0000 mg | ORAL_TABLET | Freq: Once | ORAL | Status: AC
  Administered 2022-06-29: 650 mg via ORAL
  Filled 2022-06-29: qty 2

## 2022-06-29 MED ORDER — SODIUM CHLORIDE 0.9 % IV SOLN
200.0000 mg | Freq: Once | INTRAVENOUS | Status: AC
  Administered 2022-06-29: 200 mg via INTRAVENOUS
  Filled 2022-06-29: qty 200

## 2022-06-29 NOTE — Progress Notes (Signed)
Udenyca received on 06/26/22. No Udenyca today. Recheck CBC in 3 days.  Proceed with Venofer 200mg  today per MD.   Richardean Sale, RPH, BCPS, BCOP 06/29/2022 12:10 PM

## 2022-06-29 NOTE — Patient Instructions (Signed)

## 2022-06-29 NOTE — Telephone Encounter (Signed)
Dr Myna Hidalgo aware of critical low WBC/ANC. Hgb 8.9. per Dr Myna Hidalgo, repeat WBC booster and give 1 unit PRBC today. dph

## 2022-06-30 LAB — BPAM RBC
Blood Product Expiration Date: 202406012359
ISSUE DATE / TIME: 202405101312
ISSUE DATE / TIME: 202405131025
Unit Type and Rh: 9500
Unit Type and Rh: 9500

## 2022-06-30 LAB — TYPE AND SCREEN: ABO/RH(D): O NEG

## 2022-07-01 ENCOUNTER — Telehealth: Payer: Self-pay | Admitting: Dietician

## 2022-07-01 NOTE — Telephone Encounter (Signed)
Third and final attempt to reach patient by telephone. Have left messages with return number. Please consult RD for future needs.    Cyndi Martine Bleecker, RDN, LDN Registered Dietitian, [facility] Part Time Remote (Usual office hours: Tuesday-Thursday) Mobile: [phone] Remote Office: [phone]  

## 2022-07-02 ENCOUNTER — Other Ambulatory Visit: Payer: Self-pay | Admitting: Hematology & Oncology

## 2022-07-02 ENCOUNTER — Other Ambulatory Visit (HOSPITAL_BASED_OUTPATIENT_CLINIC_OR_DEPARTMENT_OTHER): Payer: Self-pay

## 2022-07-02 DIAGNOSIS — M546 Pain in thoracic spine: Secondary | ICD-10-CM

## 2022-07-02 DIAGNOSIS — M62838 Other muscle spasm: Secondary | ICD-10-CM

## 2022-07-02 DIAGNOSIS — R110 Nausea: Secondary | ICD-10-CM

## 2022-07-02 DIAGNOSIS — C787 Secondary malignant neoplasm of liver and intrahepatic bile duct: Secondary | ICD-10-CM

## 2022-07-02 DIAGNOSIS — C73 Malignant neoplasm of thyroid gland: Secondary | ICD-10-CM

## 2022-07-02 MED ORDER — CYCLOBENZAPRINE HCL 10 MG PO TABS
10.0000 mg | ORAL_TABLET | Freq: Three times a day (TID) | ORAL | 1 refills | Status: AC | PRN
Start: 2022-07-02 — End: ?
  Filled 2022-07-02: qty 30, 10d supply, fill #0

## 2022-07-02 MED ORDER — LEVOTHYROXINE SODIUM 200 MCG PO TABS
200.0000 ug | ORAL_TABLET | Freq: Every day | ORAL | 1 refills | Status: DC
  Filled 2022-07-02: qty 90, 90d supply, fill #0

## 2022-07-02 MED ORDER — HYDROMORPHONE HCL 8 MG PO TABS
8.0000 mg | ORAL_TABLET | ORAL | 0 refills | Status: DC | PRN
Start: 2022-07-02 — End: 2022-07-20
  Filled 2022-07-02: qty 120, 20d supply, fill #0

## 2022-07-02 MED ORDER — TEMAZEPAM 30 MG PO CAPS
30.0000 mg | ORAL_CAPSULE | Freq: Every evening | ORAL | 0 refills | Status: AC | PRN
Start: 2022-07-02 — End: ?
  Filled 2022-07-02 – 2022-07-09 (×2): qty 30, 30d supply, fill #0
  Filled 2022-07-14: qty 15, 15d supply, fill #0

## 2022-07-03 ENCOUNTER — Inpatient Hospital Stay: Payer: BC Managed Care – PPO

## 2022-07-03 DIAGNOSIS — D509 Iron deficiency anemia, unspecified: Secondary | ICD-10-CM

## 2022-07-03 DIAGNOSIS — R110 Nausea: Secondary | ICD-10-CM

## 2022-07-03 DIAGNOSIS — E291 Testicular hypofunction: Secondary | ICD-10-CM

## 2022-07-03 DIAGNOSIS — C73 Malignant neoplasm of thyroid gland: Secondary | ICD-10-CM

## 2022-07-03 DIAGNOSIS — D649 Anemia, unspecified: Secondary | ICD-10-CM

## 2022-07-03 LAB — CMP (CANCER CENTER ONLY)
ALT: 9 U/L (ref 0–44)
AST: 24 U/L (ref 15–41)
Albumin: 3.6 g/dL (ref 3.5–5.0)
Alkaline Phosphatase: 394 U/L — ABNORMAL HIGH (ref 38–126)
Anion gap: 11 (ref 5–15)
BUN: 14 mg/dL (ref 6–20)
CO2: 29 mmol/L (ref 22–32)
Calcium: 9.1 mg/dL (ref 8.9–10.3)
Chloride: 99 mmol/L (ref 98–111)
Creatinine: 0.84 mg/dL (ref 0.61–1.24)
GFR, Estimated: >60 mL/min (ref >60)
Glucose, Bld: 135 mg/dL — ABNORMAL HIGH (ref 70–99)
Potassium: 3.6 mmol/L (ref 3.5–5.1)
Sodium: 139 mmol/L (ref 135–145)
Total Bilirubin: 0.5 mg/dL (ref 0.3–1.2)
Total Protein: 5.9 g/dL — ABNORMAL LOW (ref 6.5–8.1)

## 2022-07-03 LAB — CBC WITH DIFFERENTIAL (CANCER CENTER ONLY)
Abs Immature Granulocytes: 2.53 10*3/uL — ABNORMAL HIGH (ref 0.00–0.07)
Basophils Absolute: 0 10*3/uL (ref 0.0–0.1)
Basophils Relative: 0 %
Eosinophils Absolute: 0 10*3/uL (ref 0.0–0.5)
Eosinophils Relative: 0 %
HCT: 30.2 % — ABNORMAL LOW (ref 39.0–52.0)
Hemoglobin: 9.5 g/dL — ABNORMAL LOW (ref 13.0–17.0)
Immature Granulocytes: 16 %
Lymphocytes Relative: 2 %
Lymphs Abs: 0.4 10*3/uL — ABNORMAL LOW (ref 0.7–4.0)
MCH: 29.3 pg (ref 26.0–34.0)
MCHC: 31.5 g/dL (ref 30.0–36.0)
MCV: 93.2 fL (ref 80.0–100.0)
Monocytes Absolute: 2 10*3/uL — ABNORMAL HIGH (ref 0.1–1.0)
Monocytes Relative: 12 %
Neutro Abs: 11.2 10*3/uL — ABNORMAL HIGH (ref 1.7–7.7)
Neutrophils Relative %: 70 %
Platelet Count: 140 10*3/uL — ABNORMAL LOW (ref 150–400)
RBC: 3.24 MIL/uL — ABNORMAL LOW (ref 4.22–5.81)
RDW: 20.5 % — ABNORMAL HIGH (ref 11.5–15.5)
Smear Review: NORMAL
WBC Count: 16.1 10*3/uL — ABNORMAL HIGH (ref 4.0–10.5)
nRBC: 0.4 % — ABNORMAL HIGH (ref 0.0–0.2)

## 2022-07-03 LAB — SAMPLE TO BLOOD BANK

## 2022-07-03 MED ORDER — SODIUM CHLORIDE 0.9 % IV SOLN: Freq: Once | INTRAVENOUS | Status: AC

## 2022-07-03 MED ORDER — HYDROMORPHONE HCL 1 MG/ML IJ SOLN: 2.0000 mg | INTRAMUSCULAR | Status: DC | PRN

## 2022-07-03 MED ORDER — HYDROMORPHONE HCL 4 MG/ML IJ SOLN
2.0000 mg | INTRAMUSCULAR | Status: DC | PRN
  Administered 2022-07-03: 2 mg via INTRAVENOUS
  Filled 2022-07-03: qty 1

## 2022-07-07 ENCOUNTER — Other Ambulatory Visit (HOSPITAL_BASED_OUTPATIENT_CLINIC_OR_DEPARTMENT_OTHER): Payer: Self-pay

## 2022-07-09 ENCOUNTER — Other Ambulatory Visit (HOSPITAL_BASED_OUTPATIENT_CLINIC_OR_DEPARTMENT_OTHER): Payer: Self-pay

## 2022-07-09 ENCOUNTER — Inpatient Hospital Stay: Payer: BC Managed Care – PPO

## 2022-07-09 ENCOUNTER — Other Ambulatory Visit: Payer: Self-pay | Admitting: *Deleted

## 2022-07-09 VITALS — BP 105/79 | HR 92

## 2022-07-09 DIAGNOSIS — C73 Malignant neoplasm of thyroid gland: Secondary | ICD-10-CM

## 2022-07-09 DIAGNOSIS — D509 Iron deficiency anemia, unspecified: Secondary | ICD-10-CM

## 2022-07-09 DIAGNOSIS — E291 Testicular hypofunction: Secondary | ICD-10-CM

## 2022-07-09 LAB — CBC WITH DIFFERENTIAL (CANCER CENTER ONLY)
Abs Immature Granulocytes: 0.31 10*3/uL — ABNORMAL HIGH (ref 0.00–0.07)
Basophils Absolute: 0.1 10*3/uL (ref 0.0–0.1)
Basophils Relative: 1 %
Eosinophils Absolute: 0 10*3/uL (ref 0.0–0.5)
Eosinophils Relative: 0 %
HCT: 31.2 % — ABNORMAL LOW (ref 39.0–52.0)
Hemoglobin: 9.4 g/dL — ABNORMAL LOW (ref 13.0–17.0)
Immature Granulocytes: 2 %
Lymphocytes Relative: 3 %
Lymphs Abs: 0.5 10*3/uL — ABNORMAL LOW (ref 0.7–4.0)
MCH: 28.7 pg (ref 26.0–34.0)
MCHC: 30.1 g/dL (ref 30.0–36.0)
MCV: 95.4 fL (ref 80.0–100.0)
Monocytes Absolute: 2 10*3/uL — ABNORMAL HIGH (ref 0.1–1.0)
Monocytes Relative: 13 %
Neutro Abs: 12.7 10*3/uL — ABNORMAL HIGH (ref 1.7–7.7)
Neutrophils Relative %: 81 %
Platelet Count: 206 10*3/uL (ref 150–400)
RBC: 3.27 MIL/uL — ABNORMAL LOW (ref 4.22–5.81)
RDW: 21.1 % — ABNORMAL HIGH (ref 11.5–15.5)
WBC Count: 15.6 10*3/uL — ABNORMAL HIGH (ref 4.0–10.5)
nRBC: 0.3 % — ABNORMAL HIGH (ref 0.0–0.2)

## 2022-07-09 MED ORDER — LORAZEPAM 1 MG PO TABS
1.0000 mg | ORAL_TABLET | Freq: Four times a day (QID) | ORAL | 0 refills | Status: AC | PRN
Start: 2022-07-09 — End: ?
  Filled 2022-07-09: qty 30, 8d supply, fill #0

## 2022-07-09 MED ORDER — HYDROMORPHONE HCL 4 MG/ML IJ SOLN
2.0000 mg | INTRAMUSCULAR | Status: DC | PRN
  Administered 2022-07-09: 2 mg via INTRAVENOUS
  Filled 2022-07-09: qty 1

## 2022-07-09 MED ORDER — SODIUM CHLORIDE 0.9 % IV SOLN: Freq: Once | INTRAVENOUS | Status: DC | PRN

## 2022-07-09 NOTE — Patient Instructions (Signed)
Dehydration, Adult Dehydration is a condition in which there is not enough water or other fluids in the body. This happens when a person loses more fluids than they take in. Important organs cannot work right without the right amount of fluids. Any loss of fluids from the body can cause dehydration. Dehydration can be mild, worse, or very bad. It should be treated right away to keep it from getting very bad. What are the causes? Conditions that cause loss of water in the body. They include: Watery poop (diarrhea). Vomiting. Sweating a lot. Fever. Infection. Peeing (urinating) a lot. Not drinking enough fluids. Certain medicines, such as medicines that take extra fluid out of the body (diuretics). Lack of safe drinking water. Not being able to get enough water and food. What increases the risk? Having a long-term (chronic) illness that has not been treated the right way, such as: Diabetes. Heart disease. Kidney disease. Being 65 years of age or older. Having a disability. Living in a place that is high above the ground or sea (high in altitude). The thinner, drier air causes more fluid loss. Doing exercises that put stress on your body for a long time. Being active when in hot places. What are the signs or symptoms? Symptoms of dehydration depend on how bad it is. Mild or worse dehydration Thirst. Dry lips or dry mouth. Feeling dizzy or light-headed. Muscle cramps. Passing little pee or dark pee. Pee may be the color of tea. Headache. Very bad dehydration Changes in skin. Skin may: Be cold to the touch (clammy). Be blotchy or pale. Not go back to normal right after you pinch it and let it go. Little or no tears, pee, or sweat. Fast breathing. Low blood pressure. Weak pulse. Pulse that is more than 100 beats a minute when you are sitting still. Other changes, such as: Feeling very thirsty. Eyes that look hollow (sunken). Cold hands and feet. Being confused. Being very  tired (lethargic) or having trouble waking from sleep. Losing weight. Loss of consciousness. How is this treated? Treatment for this condition depends on how bad your dehydration is. Treatment should start right away. Do not wait until your condition gets very bad. Very bad dehydration is an emergency. You will need to go to a hospital. Mild or worse dehydration can be treated at home. You may be asked to: Drink more fluids. Drink an oral rehydration solution (ORS). This drink gives you the right amount of fluids, salts, and minerals (electrolytes). Very bad dehydration can be treated: With fluids through an IV tube. By correcting low levels of electrolytes in the body. By treating the problem that caused your dehydration. Follow these instructions at home: Oral rehydration solution If told by your doctor, drink an ORS: Make an ORS. Use instructions on the package. Start by drinking small amounts, about  cup (120 mL) every 5-10 minutes. Slowly drink more until you have had the amount that your doctor said to have.  Eating and drinking  Drink enough clear fluid to keep your pee pale yellow. If you were told to drink an ORS, finish the ORS first. Then, start slowly drinking other clear fluids. Drink fluids such as: Water. Do not drink only water. Doing that can make the salt (sodium) level in your body get too low. Water from ice chips you suck on. Fruit juice that you have added water to (diluted). Low-calorie sports drinks. Eat foods that have the right amounts of salts and minerals, such as bananas, oranges, potatoes,   tomatoes, or spinach. Do not drink alcohol. Avoid drinks that have caffeine or sugar. These include:: High-calorie sports drinks. Fruit juice that you did not add water to. Soda. Coffee or energy drinks. Avoid foods that are greasy or have a lot of fat or sugar. General instructions Take over-the-counter and prescription medicines only as told by your doctor. Do  not take sodium tablets. Doing that can make the salt level in your body get too high. Return to your normal activities as told by your doctor. Ask your doctor what activities are safe for you. Keep all follow-up visits. Your doctor may check and change your treatment. Contact a doctor if: You have pain in your belly (abdomen) and the pain: Gets worse. Stays in one place. You have a rash. You have a stiff neck. You get angry or annoyed more easily than normal. You are more tired or have a harder time waking than normal. You feel weak or dizzy. You feel very thirsty. Get help right away if: You have any symptoms of very bad dehydration. You vomit every time you eat or drink. Your vomiting gets worse, does not go away, or you vomit blood or green stuff. You are getting treatment, but symptoms are getting worse. You have a fever. You have a very bad headache. You have: Diarrhea that gets worse or does not go away. Blood in your poop (stool). This may cause poop to look black and tarry. No pee in 6-8 hours. Only a small amount of pee in 6-8 hours, and the pee is very dark. You have trouble breathing. These symptoms may be an emergency. Get help right away. Call 911. Do not wait to see if the symptoms will go away. Do not drive yourself to the hospital. This information is not intended to replace advice given to you by your health care provider. Make sure you discuss any questions you have with your health care provider. Document Revised: 09/01/2021 Document Reviewed: 09/01/2021 Elsevier Patient Education  2023 [employer]  

## 2022-07-09 NOTE — Patient Instructions (Signed)

## 2022-07-10 ENCOUNTER — Other Ambulatory Visit (HOSPITAL_BASED_OUTPATIENT_CLINIC_OR_DEPARTMENT_OTHER): Payer: Self-pay

## 2022-07-14 ENCOUNTER — Other Ambulatory Visit: Payer: Self-pay

## 2022-07-14 ENCOUNTER — Other Ambulatory Visit (HOSPITAL_BASED_OUTPATIENT_CLINIC_OR_DEPARTMENT_OTHER): Payer: Self-pay

## 2022-07-15 ENCOUNTER — Inpatient Hospital Stay: Payer: BC Managed Care – PPO

## 2022-07-15 ENCOUNTER — Ambulatory Visit: Payer: BC Managed Care – PPO | Admitting: Hematology & Oncology

## 2022-07-15 ENCOUNTER — Ambulatory Visit: Payer: BC Managed Care – PPO

## 2022-07-15 ENCOUNTER — Other Ambulatory Visit: Payer: BC Managed Care – PPO

## 2022-07-20 ENCOUNTER — Other Ambulatory Visit (HOSPITAL_BASED_OUTPATIENT_CLINIC_OR_DEPARTMENT_OTHER): Payer: Self-pay

## 2022-07-20 ENCOUNTER — Other Ambulatory Visit: Payer: Self-pay | Admitting: Hematology & Oncology

## 2022-07-20 DIAGNOSIS — C787 Secondary malignant neoplasm of liver and intrahepatic bile duct: Secondary | ICD-10-CM

## 2022-07-20 DIAGNOSIS — R110 Nausea: Secondary | ICD-10-CM

## 2022-07-20 DIAGNOSIS — C73 Malignant neoplasm of thyroid gland: Secondary | ICD-10-CM

## 2022-07-20 DIAGNOSIS — M546 Pain in thoracic spine: Secondary | ICD-10-CM

## 2022-07-21 ENCOUNTER — Other Ambulatory Visit (HOSPITAL_BASED_OUTPATIENT_CLINIC_OR_DEPARTMENT_OTHER): Payer: Self-pay

## 2022-07-21 MED ORDER — HYDROMORPHONE HCL 8 MG PO TABS
8.0000 mg | ORAL_TABLET | ORAL | 0 refills | Status: DC | PRN
Start: 2022-07-21 — End: 2022-08-05
  Filled 2022-07-21: qty 120, 20d supply, fill #0

## 2022-07-22 ENCOUNTER — Other Ambulatory Visit: Payer: Self-pay

## 2022-07-22 ENCOUNTER — Inpatient Hospital Stay: Payer: BC Managed Care – PPO | Attending: Hematology & Oncology

## 2022-07-22 ENCOUNTER — Inpatient Hospital Stay: Payer: BC Managed Care – PPO

## 2022-07-22 ENCOUNTER — Inpatient Hospital Stay (HOSPITAL_BASED_OUTPATIENT_CLINIC_OR_DEPARTMENT_OTHER): Payer: BC Managed Care – PPO | Admitting: Hematology & Oncology

## 2022-07-22 ENCOUNTER — Other Ambulatory Visit: Payer: Self-pay | Admitting: *Deleted

## 2022-07-22 ENCOUNTER — Encounter: Payer: Self-pay | Admitting: Hematology & Oncology

## 2022-07-22 VITALS — BP 111/79 | HR 94 | Temp 97.8°F

## 2022-07-22 VITALS — BP 105/55 | HR 99 | Temp 98.0°F | Resp 18

## 2022-07-22 DIAGNOSIS — C73 Malignant neoplasm of thyroid gland: Secondary | ICD-10-CM

## 2022-07-22 DIAGNOSIS — E291 Testicular hypofunction: Secondary | ICD-10-CM

## 2022-07-22 DIAGNOSIS — C801 Malignant (primary) neoplasm, unspecified: Secondary | ICD-10-CM

## 2022-07-22 DIAGNOSIS — C787 Secondary malignant neoplasm of liver and intrahepatic bile duct: Secondary | ICD-10-CM | POA: Insufficient documentation

## 2022-07-22 DIAGNOSIS — D509 Iron deficiency anemia, unspecified: Secondary | ICD-10-CM

## 2022-07-22 DIAGNOSIS — Z95828 Presence of other vascular implants and grafts: Secondary | ICD-10-CM

## 2022-07-22 DIAGNOSIS — G893 Neoplasm related pain (acute) (chronic): Secondary | ICD-10-CM | POA: Insufficient documentation

## 2022-07-22 LAB — CBC WITH DIFFERENTIAL (CANCER CENTER ONLY)
Abs Immature Granulocytes: 0.06 10*3/uL (ref 0.00–0.07)
Basophils Absolute: 0.1 10*3/uL (ref 0.0–0.1)
Basophils Relative: 0 %
Eosinophils Absolute: 0.1 10*3/uL (ref 0.0–0.5)
Eosinophils Relative: 1 %
HCT: 31.8 % — ABNORMAL LOW (ref 39.0–52.0)
Hemoglobin: 10 g/dL — ABNORMAL LOW (ref 13.0–17.0)
Immature Granulocytes: 1 %
Lymphocytes Relative: 3 %
Lymphs Abs: 0.4 10*3/uL — ABNORMAL LOW (ref 0.7–4.0)
MCH: 29.4 pg (ref 26.0–34.0)
MCHC: 31.4 g/dL (ref 30.0–36.0)
MCV: 93.5 fL (ref 80.0–100.0)
Monocytes Absolute: 1.1 10*3/uL — ABNORMAL HIGH (ref 0.1–1.0)
Monocytes Relative: 9 %
Neutro Abs: 10.5 10*3/uL — ABNORMAL HIGH (ref 1.7–7.7)
Neutrophils Relative %: 86 %
Platelet Count: 337 10*3/uL (ref 150–400)
RBC: 3.4 MIL/uL — ABNORMAL LOW (ref 4.22–5.81)
RDW: 19.3 % — ABNORMAL HIGH (ref 11.5–15.5)
WBC Count: 12.2 10*3/uL — ABNORMAL HIGH (ref 4.0–10.5)
nRBC: 0 % (ref 0.0–0.2)

## 2022-07-22 LAB — CMP (CANCER CENTER ONLY)
ALT: 13 U/L (ref 0–44)
AST: 35 U/L (ref 15–41)
Albumin: 3.8 g/dL (ref 3.5–5.0)
Alkaline Phosphatase: 531 U/L — ABNORMAL HIGH (ref 38–126)
Anion gap: 9 (ref 5–15)
BUN: 20 mg/dL (ref 6–20)
CO2: 25 mmol/L (ref 22–32)
Calcium: 10.4 mg/dL — ABNORMAL HIGH (ref 8.9–10.3)
Chloride: 100 mmol/L (ref 98–111)
Creatinine: 0.84 mg/dL (ref 0.61–1.24)
GFR, Estimated: >60 mL/min (ref >60)
Glucose, Bld: 109 mg/dL — ABNORMAL HIGH (ref 70–99)
Potassium: 4.5 mmol/L (ref 3.5–5.1)
Sodium: 134 mmol/L — ABNORMAL LOW (ref 135–145)
Total Bilirubin: 1.4 mg/dL — ABNORMAL HIGH (ref 0.3–1.2)
Total Protein: 6.7 g/dL (ref 6.5–8.1)

## 2022-07-22 LAB — SAMPLE TO BLOOD BANK

## 2022-07-22 LAB — LACTATE DEHYDROGENASE: LDH: 591 U/L — ABNORMAL HIGH (ref 98–192)

## 2022-07-22 MED ORDER — HYDROMORPHONE HCL 1 MG/ML IJ SOLN
2.0000 mg | INTRAMUSCULAR | Status: AC | PRN
  Administered 2022-07-22: 2 mg via INTRAVENOUS
  Filled 2022-07-22: qty 2

## 2022-07-22 MED ORDER — HEPARIN SOD (PORK) LOCK FLUSH 100 UNIT/ML IV SOLN
500.0000 [IU] | Freq: Once | INTRAVENOUS | Status: AC
  Administered 2022-07-22: 500 [IU] via INTRAVENOUS

## 2022-07-22 MED ORDER — TESTOSTERONE CYPIONATE 200 MG/ML IM SOLN
400.0000 mg | INTRAMUSCULAR | Status: DC
  Administered 2022-07-22: 400 mg via INTRAMUSCULAR
  Filled 2022-07-22: qty 2

## 2022-07-22 MED ORDER — SODIUM CHLORIDE 0.9 % IV SOLN
20.0000 mg | Freq: Once | INTRAVENOUS | Status: AC
  Administered 2022-07-22: 20 mg via INTRAVENOUS
  Filled 2022-07-22: qty 20

## 2022-07-22 MED ORDER — SODIUM CHLORIDE 0.9 % IV SOLN: INTRAVENOUS | Status: DC

## 2022-07-22 MED ORDER — SODIUM CHLORIDE 0.9 % IV SOLN: Freq: Once | INTRAVENOUS | Status: AC

## 2022-07-22 MED ORDER — SODIUM CHLORIDE 0.9% FLUSH
10.0000 mL | Freq: Once | INTRAVENOUS | Status: AC
  Administered 2022-07-22: 10 mL via INTRAVENOUS

## 2022-07-22 NOTE — Progress Notes (Signed)
Pt c/o pain, VO from MD, pt to have 20mg  Decadron IVPB. With additional 500cc NS bolus.

## 2022-07-22 NOTE — Patient Instructions (Signed)

## 2022-07-22 NOTE — Patient Instructions (Signed)
Dehydration, Adult Dehydration is a condition in which there is not enough water or other fluids in the body. This happens when a person loses more fluids than they take in. Important organs cannot work right without the right amount of fluids. Any loss of fluids from the body can cause dehydration. Dehydration can be mild, worse, or very bad. It should be treated right away to keep it from getting very bad. What are the causes? Conditions that cause loss of water in the body. They include: Watery poop (diarrhea). Vomiting. Sweating a lot. Fever. Infection. Peeing (urinating) a lot. Not drinking enough fluids. Certain medicines, such as medicines that take extra fluid out of the body (diuretics). Lack of safe drinking water. Not being able to get enough water and food. What increases the risk? Having a long-term (chronic) illness that has not been treated the right way, such as: Diabetes. Heart disease. Kidney disease. Being 65 years of age or older. Having a disability. Living in a place that is high above the ground or sea (high in altitude). The thinner, drier air causes more fluid loss. Doing exercises that put stress on your body for a long time. Being active when in hot places. What are the signs or symptoms? Symptoms of dehydration depend on how bad it is. Mild or worse dehydration Thirst. Dry lips or dry mouth. Feeling dizzy or light-headed. Muscle cramps. Passing little pee or dark pee. Pee may be the color of tea. Headache. Very bad dehydration Changes in skin. Skin may: Be cold to the touch (clammy). Be blotchy or pale. Not go back to normal right after you pinch it and let it go. Little or no tears, pee, or sweat. Fast breathing. Low blood pressure. Weak pulse. Pulse that is more than 100 beats a minute when you are sitting still. Other changes, such as: Feeling very thirsty. Eyes that look hollow (sunken). Cold hands and feet. Being confused. Being very  tired (lethargic) or having trouble waking from sleep. Losing weight. Loss of consciousness. How is this treated? Treatment for this condition depends on how bad your dehydration is. Treatment should start right away. Do not wait until your condition gets very bad. Very bad dehydration is an emergency. You will need to go to a hospital. Mild or worse dehydration can be treated at home. You may be asked to: Drink more fluids. Drink an oral rehydration solution (ORS). This drink gives you the right amount of fluids, salts, and minerals (electrolytes). Very bad dehydration can be treated: With fluids through an IV tube. By correcting low levels of electrolytes in the body. By treating the problem that caused your dehydration. Follow these instructions at home: Oral rehydration solution If told by your doctor, drink an ORS: Make an ORS. Use instructions on the package. Start by drinking small amounts, about  cup (120 mL) every 5-10 minutes. Slowly drink more until you have had the amount that your doctor said to have.  Eating and drinking  Drink enough clear fluid to keep your pee pale yellow. If you were told to drink an ORS, finish the ORS first. Then, start slowly drinking other clear fluids. Drink fluids such as: Water. Do not drink only water. Doing that can make the salt (sodium) level in your body get too low. Water from ice chips you suck on. Fruit juice that you have added water to (diluted). Low-calorie sports drinks. Eat foods that have the right amounts of salts and minerals, such as bananas, oranges, potatoes,   tomatoes, or spinach. Do not drink alcohol. Avoid drinks that have caffeine or sugar. These include:: High-calorie sports drinks. Fruit juice that you did not add water to. Soda. Coffee or energy drinks. Avoid foods that are greasy or have a lot of fat or sugar. General instructions Take over-the-counter and prescription medicines only as told by your doctor. Do  not take sodium tablets. Doing that can make the salt level in your body get too high. Return to your normal activities as told by your doctor. Ask your doctor what activities are safe for you. Keep all follow-up visits. Your doctor may check and change your treatment. Contact a doctor if: You have pain in your belly (abdomen) and the pain: Gets worse. Stays in one place. You have a rash. You have a stiff neck. You get angry or annoyed more easily than normal. You are more tired or have a harder time waking than normal. You feel weak or dizzy. You feel very thirsty. Get help right away if: You have any symptoms of very bad dehydration. You vomit every time you eat or drink. Your vomiting gets worse, does not go away, or you vomit blood or green stuff. You are getting treatment, but symptoms are getting worse. You have a fever. You have a very bad headache. You have: Diarrhea that gets worse or does not go away. Blood in your poop (stool). This may cause poop to look black and tarry. No pee in 6-8 hours. Only a small amount of pee in 6-8 hours, and the pee is very dark. You have trouble breathing. These symptoms may be an emergency. Get help right away. Call 911. Do not wait to see if the symptoms will go away. Do not drive yourself to the hospital. This information is not intended to replace advice given to you by your health care provider. Make sure you discuss any questions you have with your health care provider. Document Revised: 09/01/2021 Document Reviewed: 09/01/2021 Elsevier Patient Education  2024 [employer].  

## 2022-07-22 NOTE — Progress Notes (Signed)
6 weeks Hematology and Oncology Follow Up Visit  Brian [name] [id number] 19-Oct-1975 47 y.o. 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 + Bilateral pulmonary emboli without right heart strain  DVT left popliteal vein    Past Therapy: Cabometyx 40 mg po q day -- start on 04/17/2019 - d/c on 07/17/2019 for progression Lenvima 24 mg po q day -- on hold due to proteinuria Taxotere/CDDP -- s/p cycle 4 - started on 09/13/2019 Adriamycin weekly 2 weeks on 1 week off - s/p cycle 8 --d/c on 04/16/2020 due to  progression Gavreto 400 mg po q day -- started on 05/17/2020  -- d/c on 06/16/2020 Pembrolizumab 200 mg IV q 3 week -- s/p cycle #8 - start on 06/26/2020 -- d/c on 01/16/2021 due to progression Vinblastine q week x 4 weeks on / 1 week off -- s/p  cycle #2 -- start on 02/24/2021 -- d/c on 05/20/2021 XRT for LEFT shoulder met -completed on 06/22/2021   Current Therapy:     FOLFOX -- s/p cycle #10 -- start on 07/08/2021 -- d/c on 05/12/2022 CDDP/Adria/Cytoxan -- s/p cycle # 2 -- start on 05/19/2022 Zometa 4 mg IV q 2 months - next dose on 05/2022 Eliquis 5 mg po BID Testosterone 400 mg IM q 2 weeks   Interim History:  [name] is here today for follow-up.  Unfortunately, I think that he is declining.  He really does not look that good.  He is having more in the way of pain.  I have to believe that it is his cancer that is progressing.    He has had a tough time with treatment.  I realize this is an incredibly aggressive protocol.  Unfortunately, this is the last thing I could think of that we could try to help his cancer.  He is supposed to have had scans already.  For some reason, they were not scheduled.  He needs to have better pain medication.  He is having more pain in the right side.  I am sure this is where his liver mets are.  We will have to make an adjustment with his long-acting and short acting pain  medication.  He is just not strong enough right now to take treatment.  I just feel bad for him.  I know he is followed hard.  I know he has tried everything that we have asked him to do.  Again, quality of life is always been a priority.  He has had no vomiting.  Has had some nausea.  I know that the testosterone that were given does make him feel a little bit better.  He has had no obvious bleeding.  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  He is not eating all that much.  He just does not have much of an appetite.  Some of this because of the pain that he has.  Currently, I would say his performance status is probably ECOG 3.    Medications:  Allergies as of 07/22/2022       Reactions   Dextromethorphan-guaifenesin Other (See Comments)   Irregular heartbeat   Doxycycline Anaphylaxis, Nausea And Vomiting, Rash, Other (See Comments)   "heart arrythmia" and "dyspepsia" (only oral doxycycline causes reaction)   Gadobutrol Hives, Other (See Comments)   Patient had MRI scan at [facility]. Patient called one hour after he left imaging facility to report two "blisters" that  came up on "back" of lip.   Gadolinium Derivatives Hives   Patient had MRI scan at [facility]. Patient called one hour after he left imaging facility to report two "blisters" that came up on "back" of lip.    Guaifenesin Palpitations      Ibuprofen Hives, Itching   Lisinopril Other (See Comments)   Angioedema   Pantoprazole Itching   Tramadol Hives, Itching   Nexavar [sorafenib] Hives   Barium Rash   Developed redness around neck after drinking 1st bottle of Barocat; pt was given Benedryl by ED Mds to be "on the safe side" before drinking 2nd bottle   Chlorhexidine Hives        Medication List        Accurate as of July 22, 2022  5:52 PM. If you have any questions, ask your nurse or doctor.          baclofen 10 MG tablet Commonly known as: LIORESAL Take 1 tablet (10 mg  total) by mouth 4 (four) times daily as needed for muscle spasms.   chlorproMAZINE 25 MG tablet Commonly known as: THORAZINE Take 1 tablet (25 mg total) by mouth 4 (four) times daily as needed.   cyclobenzaprine 10 MG tablet Commonly known as: FLEXERIL Take 1 tablet (10 mg total) by mouth 3 (three) times daily as needed for muscle spasms.   dexamethasone 4 MG tablet Commonly known as: DECADRON Take 2 tablets (8 mg total) by mouth daily for 3 days starting the day after cisplatin chemotherapy. Take with food.   diphenhydrAMINE 25 mg capsule Commonly known as: BENADRYL Take 2 capsules (50 mg total) by mouth once for 1 dose.   Eliquis 5 MG Tabs tablet Generic drug: apixaban TAKE 1 TABLET (5 MG TOTAL) BY MOUTH 2 (TWO) TIMES DAILY.   EPINEPHrine 0.3 mg/0.3 mL Soaj injection Commonly known as: EpiPen 2-Pak USE AS DIRECTED FOR LIFE THREATENING ALLERGIC REACTIONS   famotidine 40 MG tablet Commonly known as: PEPCID Take 1 tablet (40 mg total) by mouth 2 (two) times daily.   Fusion Plus Caps Take 1 capsule by mouth daily.   HYDROmorphone 8 MG tablet Commonly known as: DILAUDID Take 1 tablet (8 mg total) by mouth every 4 (four) hours as needed for severe pain.   levothyroxine 200 MCG tablet Commonly known as: SYNTHROID Take 1 tablet (200 mcg total) by mouth daily. Take with 88 mcg tablet for a total dose of 288 mcg/day.   LORazepam 1 MG tablet Commonly known as: ATIVAN Take 1 tablet (1 mg total) by mouth every 6 (six) hours as needed for anxiety (for nausea and vomiting).   magic mouthwash Soln Swish and spit 15 mls by mouth every four hours as needed for mouth pain.   methylphenidate 10 MG tablet Commonly known as: RITALIN Take 1 tablet (10 mg total) by mouth 2 (two) times daily.   metoCLOPramide 10 MG tablet Commonly known as: REGLAN Take 1 tablet (10 mg total) by mouth every 6 (six) hours as needed for nausea or vomiting.   metolazone 5 MG tablet Commonly known as:  ZAROXOLYN Take 5 mg by mouth daily.   metoprolol tartrate 50 MG tablet Commonly known as: Lopressor Take 1 tablet (50 mg total) by mouth 2 (two) times daily.   multivitamin capsule Take 1 capsule by mouth daily.   OLANZapine 10 MG tablet Commonly known as: ZYPREXA TAKE 1 TABLET (10 MG TOTAL) BY MOUTH AT BEDTIME.   ondansetron 8 MG tablet Commonly known as:  Zofran Take 1 tablet (8 mg total) by mouth every 8 (eight) hours as needed for nausea or vomiting. Start on the third day after cisplatin.   PEG 3350 17 GM/SCOOP Powd Mix 17 grams (1 Capful) in 8 ounces of liquid and drink by mouth daily as needed.   potassium chloride SA 20 MEQ tablet Commonly known as: KLOR-CON M Take 1 tablet (20 mEq total) by mouth daily.   prednisoLONE acetate 1 % ophthalmic suspension Commonly known as: PRED FORTE   predniSONE 50 MG tablet Commonly known as: DELTASONE TAKE 1 TABLET BY MOUTH 13 HOURS BEFORE CT SCAN, 1 TABLET 7 HOURS BEFORE CT SCAN, AND 1 TABLET 1 HOUR BEFORE CT SCAN   prochlorperazine 10 MG tablet Commonly known as: COMPAZINE Take 1 tablet (10 mg total) by mouth every 6 (six) hours as needed (Nausea or vomiting).   promethazine 25 MG tablet Commonly known as: PHENERGAN Take 1 tablet (25 mg total) by mouth every 6 (six) hours as needed for nausea and vomiting.   senna-docusate 8.6-50 MG tablet Commonly known as: Senokot-S Take 1 tablet by mouth at bedtime as needed for mild constipation.   tamsulosin 0.4 MG Caps capsule Commonly known as: Flomax Take 1 capsule (0.4 mg total) by mouth daily after supper.   temazepam 30 MG capsule Commonly known as: RESTORIL Take 1 capsule (30 mg total) by mouth at bedtime as needed for sleep.        Allergies:  Allergies  Allergen Reactions   Dextromethorphan-Guaifenesin Other (See Comments)    Irregular heartbeat    Doxycycline Anaphylaxis, Nausea And Vomiting, Rash and Other (See Comments)    "heart arrythmia" and  "dyspepsia" (only oral doxycycline causes reaction)   Gadobutrol Hives and Other (See Comments)    Patient had MRI scan at [facility]. Patient called one hour after he left imaging facility to report two "blisters" that came up on "back" of lip.   Gadolinium Derivatives Hives    Patient had MRI scan at [facility]. Patient called one hour after he left imaging facility to report two "blisters" that came up on "back" of lip.    Guaifenesin Palpitations        Ibuprofen Hives and Itching   Lisinopril Other (See Comments)    Angioedema   Pantoprazole Itching   Tramadol Hives and Itching   Nexavar [Sorafenib] Hives   Barium Rash    Developed redness around neck after drinking 1st bottle of Barocat; pt was given Benedryl by ED Mds to be "on the safe side" before drinking 2nd bottle   Chlorhexidine Hives    Past Medical History, Surgical history, Social history, and Family History were reviewed and updated.  Review of Systems: Review of Systems  Constitutional:  Positive for malaise/fatigue.  HENT: Negative.    Eyes: Negative.   Respiratory: Negative.    Cardiovascular:  Positive for leg swelling.  Gastrointestinal:  Positive for abdominal pain and nausea. Negative for vomiting.  Genitourinary: Negative.   Musculoskeletal:  Positive for myalgias.  Skin: Negative.   Neurological: Negative.   Endo/Heme/Allergies: Negative.   Psychiatric/Behavioral: Negative.       Physical Exam:  oral temperature is 98 F (36.7 C). His blood pressure is 105/55 (abnormal) and his pulse is 99. His respiration is 18 and oxygen saturation is 98%.   Wt Readings from Last 3 Encounters:  06/29/22 249 lb (112.9 kg)  06/16/22 253 lb 12.8 oz (115.1 kg)  06/05/22 259 lb 1.9 oz (117.5 kg)  Physical Exam Vitals reviewed.  HENT:     Head: Normocephalic and atraumatic.  Eyes:     Pupils: Pupils are equal, round, and reactive to light.  Cardiovascular:     Rate and Rhythm: Normal rate and  regular rhythm.     Heart sounds: Normal heart sounds.  Pulmonary:     Effort: Pulmonary effort is normal.     Breath sounds: Normal breath sounds.  Abdominal:     General: Bowel sounds are normal.     Palpations: Abdomen is soft.  Musculoskeletal:        General: No tenderness or deformity. Normal range of motion.     Cervical back: Normal range of motion.  Lymphadenopathy:     Cervical: No cervical adenopathy.  Skin:    General: Skin is warm and dry.     Findings: No erythema or rash.  Neurological:     Mental Status: He is alert and oriented to person, place, and time.  Psychiatric:        Behavior: Behavior normal.        Thought Content: Thought content normal.        Judgment: Judgment normal.      Lab Results  Component Value Date   WBC 12.2 (H) 07/22/2022   HGB 10.0 (L) 07/22/2022   HCT 31.8 (L) 07/22/2022   MCV 93.5 07/22/2022   PLT 337 07/22/2022   Lab Results  Component Value Date   FERRITIN 1,445 (H) 06/16/2022   IRON 30 (L) 06/16/2022   TIBC 234 (L) 06/16/2022   UIBC 204 06/16/2022   IRONPCTSAT 13 (L) 06/16/2022   Lab Results  Component Value Date   RETICCTPCT 3.1 06/16/2022   RBC 3.40 (L) 07/22/2022   No results found for: "KPAFRELGTCHN", "LAMBDASER", "KAPLAMBRATIO" No results found for: "IGGSERUM", "IGA", "IGMSERUM" No results found for: "TOTALPROTELP", "ALBUMINELP", "A1GS", "A2GS", "BETS", "BETA2SER", "GAMS", "MSPIKE", "SPEI"   Chemistry      Component Value Date/Time   NA 134 (L) 07/22/2022 0810   NA 141 02/06/2019 1210   K 4.5 07/22/2022 0810   CL 100 07/22/2022 0810   CO2 25 07/22/2022 0810   BUN 20 07/22/2022 0810   BUN 8 02/06/2019 1210   CREATININE 0.84 07/22/2022 0810      Component Value Date/Time   CALCIUM 10.4 (H) 07/22/2022 0810   ALKPHOS 531 (H) 07/22/2022 0810   AST 35 07/22/2022 0810   ALT 13 07/22/2022 0810   BILITOT 1.4 (H) 07/22/2022 0810       Impression and Plan: [name] is a very pleasant 47 yo caucasian  gentleman with an 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    This really is his last line of chemotherapy.  I realize that this is quite aggressive.   We really have to get scans done.  At least, we can try to get the MRI done.  I know that PET scan can certainly take a lot longer.  I will have to make adjustment with his pain medications.  I really want him to be more comfortable.  He deserves this.  I want to have to see him back in 2 weeks.  We may have to get him back sooner just for palliative purposes so we can keep him comfortable.  I may have to get him in tomorrow for some more fluids and pain medication.  Again, if we find that he is progressing, I will have to talk to him about Hospice.  I really think this would be the only option that we would have for him so that he we will have comfort, respect, and dignity.   Marland Kitchen   Josph Macho, MD 6/5/20245:52 PM

## 2022-07-22 NOTE — Addendum Note (Signed)
Addended by: Reesa Chew on: 07/22/2022 08:19 AM   Modules accepted: Orders

## 2022-07-23 ENCOUNTER — Other Ambulatory Visit (HOSPITAL_BASED_OUTPATIENT_CLINIC_OR_DEPARTMENT_OTHER): Payer: Self-pay

## 2022-07-23 ENCOUNTER — Other Ambulatory Visit: Payer: Self-pay | Admitting: Hematology & Oncology

## 2022-07-23 ENCOUNTER — Inpatient Hospital Stay: Payer: BC Managed Care – PPO

## 2022-07-23 ENCOUNTER — Telehealth: Payer: Self-pay | Admitting: *Deleted

## 2022-07-23 ENCOUNTER — Other Ambulatory Visit: Payer: BC Managed Care – PPO

## 2022-07-23 ENCOUNTER — Other Ambulatory Visit: Payer: Self-pay | Admitting: *Deleted

## 2022-07-23 VITALS — BP 103/86 | HR 99 | Temp 97.8°F | Resp 17

## 2022-07-23 DIAGNOSIS — C73 Malignant neoplasm of thyroid gland: Secondary | ICD-10-CM

## 2022-07-23 DIAGNOSIS — D509 Iron deficiency anemia, unspecified: Secondary | ICD-10-CM

## 2022-07-23 DIAGNOSIS — Z95828 Presence of other vascular implants and grafts: Secondary | ICD-10-CM

## 2022-07-23 DIAGNOSIS — C799 Secondary malignant neoplasm of unspecified site: Secondary | ICD-10-CM

## 2022-07-23 DIAGNOSIS — G893 Neoplasm related pain (acute) (chronic): Secondary | ICD-10-CM | POA: Diagnosis not present

## 2022-07-23 DIAGNOSIS — E291 Testicular hypofunction: Secondary | ICD-10-CM

## 2022-07-23 LAB — CBC WITH DIFFERENTIAL (CANCER CENTER ONLY)
Abs Immature Granulocytes: 0.15 10*3/uL — ABNORMAL HIGH (ref 0.00–0.07)
Basophils Absolute: 0 10*3/uL (ref 0.0–0.1)
Basophils Relative: 0 %
Eosinophils Absolute: 0 10*3/uL (ref 0.0–0.5)
Eosinophils Relative: 0 %
HCT: 32.2 % — ABNORMAL LOW (ref 39.0–52.0)
Hemoglobin: 10 g/dL — ABNORMAL LOW (ref 13.0–17.0)
Immature Granulocytes: 1 %
Lymphocytes Relative: 2 %
Lymphs Abs: 0.3 10*3/uL — ABNORMAL LOW (ref 0.7–4.0)
MCH: 29.2 pg (ref 26.0–34.0)
MCHC: 31.1 g/dL (ref 30.0–36.0)
MCV: 93.9 fL (ref 80.0–100.0)
Monocytes Absolute: 1.2 10*3/uL — ABNORMAL HIGH (ref 0.1–1.0)
Monocytes Relative: 8 %
Neutro Abs: 14.9 10*3/uL — ABNORMAL HIGH (ref 1.7–7.7)
Neutrophils Relative %: 89 %
Platelet Count: 415 10*3/uL — ABNORMAL HIGH (ref 150–400)
RBC: 3.43 MIL/uL — ABNORMAL LOW (ref 4.22–5.81)
RDW: 18.6 % — ABNORMAL HIGH (ref 11.5–15.5)
WBC Count: 16.6 10*3/uL — ABNORMAL HIGH (ref 4.0–10.5)
nRBC: 0 % (ref 0.0–0.2)

## 2022-07-23 LAB — CMP (CANCER CENTER ONLY)
ALT: 15 U/L (ref 0–44)
AST: 41 U/L (ref 15–41)
Albumin: 3.9 g/dL (ref 3.5–5.0)
Alkaline Phosphatase: 561 U/L — ABNORMAL HIGH (ref 38–126)
Anion gap: 14 (ref 5–15)
BUN: 29 mg/dL — ABNORMAL HIGH (ref 6–20)
CO2: 23 mmol/L (ref 22–32)
Calcium: 10 mg/dL (ref 8.9–10.3)
Chloride: 99 mmol/L (ref 98–111)
Creatinine: 0.85 mg/dL (ref 0.61–1.24)
GFR, Estimated: >60 mL/min (ref >60)
Glucose, Bld: 105 mg/dL — ABNORMAL HIGH (ref 70–99)
Potassium: 4.3 mmol/L (ref 3.5–5.1)
Sodium: 136 mmol/L (ref 135–145)
Total Bilirubin: 0.8 mg/dL (ref 0.3–1.2)
Total Protein: 6.9 g/dL (ref 6.5–8.1)

## 2022-07-23 LAB — PREALBUMIN: Prealbumin: 12 mg/dL — ABNORMAL LOW (ref 18–38)

## 2022-07-23 LAB — TESTOSTERONE: Testosterone: 151 ng/dL — ABNORMAL LOW (ref 264–916)

## 2022-07-23 MED ORDER — HYDROMORPHONE HCL 1 MG/ML IJ SOLN
2.0000 mg | Freq: Once | INTRAMUSCULAR | Status: DC
  Filled 2022-07-23: qty 2

## 2022-07-23 MED ORDER — SODIUM CHLORIDE 0.9 % IV SOLN: Freq: Once | INTRAVENOUS | Status: DC | PRN

## 2022-07-23 MED ORDER — HYDROMORPHONE HCL 1 MG/ML IJ SOLN
2.0000 mg | INTRAMUSCULAR | Status: DC | PRN
  Administered 2022-07-23 (×2): 2 mg via INTRAVENOUS
  Filled 2022-07-23: qty 2

## 2022-07-23 MED ORDER — MORPHINE SULFATE ER 15 MG PO TBCR
15.0000 mg | EXTENDED_RELEASE_TABLET | Freq: Two times a day (BID) | ORAL | 0 refills | Status: DC
  Filled 2022-07-23: qty 60, 30d supply, fill #0

## 2022-07-23 MED ORDER — EPINEPHRINE 0.3 MG/0.3ML IJ SOAJ: 0.3000 mg | Freq: Once | INTRAMUSCULAR | Status: DC | PRN

## 2022-07-23 NOTE — Telephone Encounter (Signed)
Message received from patient stating that he is on the way to office for IV fluids.  Message sent to scheduling.

## 2022-07-23 NOTE — Telephone Encounter (Signed)
Call placed to patient per order of Dr. Myna Hidalgo to instruct pt to come in today for IVF's.  Pt states that he is not able to come in this morning due to transportation issues, but may be able to come in this afternoon.  Instructed pt to call office when he finds out if he can get transportation to come in this afternoon.

## 2022-07-23 NOTE — Progress Notes (Signed)
1450 - pt reporting lt flank pain has returned 8/10. Per Dr Myna Hidalgo, repeat Dilaudid 2mg  now.

## 2022-07-23 NOTE — Patient Instructions (Signed)
Dehydration, Adult Dehydration is a condition in which there is not enough water or other fluids in the body. This happens when a person loses more fluids than they take in. Important organs cannot work right without the right amount of fluids. Any loss of fluids from the body can cause dehydration. Dehydration can be mild, worse, or very bad. It should be treated right away to keep it from getting very bad. What are the causes? Conditions that cause loss of water in the body. They include: Watery poop (diarrhea). Vomiting. Sweating a lot. Fever. Infection. Peeing (urinating) a lot. Not drinking enough fluids. Certain medicines, such as medicines that take extra fluid out of the body (diuretics). Lack of safe drinking water. Not being able to get enough water and food. What increases the risk? Having a long-term (chronic) illness that has not been treated the right way, such as: Diabetes. Heart disease. Kidney disease. Being 65 years of age or older. Having a disability. Living in a place that is high above the ground or sea (high in altitude). The thinner, drier air causes more fluid loss. Doing exercises that put stress on your body for a long time. Being active when in hot places. What are the signs or symptoms? Symptoms of dehydration depend on how bad it is. Mild or worse dehydration Thirst. Dry lips or dry mouth. Feeling dizzy or light-headed. Muscle cramps. Passing little pee or dark pee. Pee may be the color of tea. Headache. Very bad dehydration Changes in skin. Skin may: Be cold to the touch (clammy). Be blotchy or pale. Not go back to normal right after you pinch it and let it go. Little or no tears, pee, or sweat. Fast breathing. Low blood pressure. Weak pulse. Pulse that is more than 100 beats a minute when you are sitting still. Other changes, such as: Feeling very thirsty. Eyes that look hollow (sunken). Cold hands and feet. Being confused. Being very  tired (lethargic) or having trouble waking from sleep. Losing weight. Loss of consciousness. How is this treated? Treatment for this condition depends on how bad your dehydration is. Treatment should start right away. Do not wait until your condition gets very bad. Very bad dehydration is an emergency. You will need to go to a hospital. Mild or worse dehydration can be treated at home. You may be asked to: Drink more fluids. Drink an oral rehydration solution (ORS). This drink gives you the right amount of fluids, salts, and minerals (electrolytes). Very bad dehydration can be treated: With fluids through an IV tube. By correcting low levels of electrolytes in the body. By treating the problem that caused your dehydration. Follow these instructions at home: Oral rehydration solution If told by your doctor, drink an ORS: Make an ORS. Use instructions on the package. Start by drinking small amounts, about  cup (120 mL) every 5-10 minutes. Slowly drink more until you have had the amount that your doctor said to have.  Eating and drinking  Drink enough clear fluid to keep your pee pale yellow. If you were told to drink an ORS, finish the ORS first. Then, start slowly drinking other clear fluids. Drink fluids such as: Water. Do not drink only water. Doing that can make the salt (sodium) level in your body get too low. Water from ice chips you suck on. Fruit juice that you have added water to (diluted). Low-calorie sports drinks. Eat foods that have the right amounts of salts and minerals, such as bananas, oranges, potatoes,   tomatoes, or spinach. Do not drink alcohol. Avoid drinks that have caffeine or sugar. These include:: High-calorie sports drinks. Fruit juice that you did not add water to. Soda. Coffee or energy drinks. Avoid foods that are greasy or have a lot of fat or sugar. General instructions Take over-the-counter and prescription medicines only as told by your doctor. Do  not take sodium tablets. Doing that can make the salt level in your body get too high. Return to your normal activities as told by your doctor. Ask your doctor what activities are safe for you. Keep all follow-up visits. Your doctor may check and change your treatment. Contact a doctor if: You have pain in your belly (abdomen) and the pain: Gets worse. Stays in one place. You have a rash. You have a stiff neck. You get angry or annoyed more easily than normal. You are more tired or have a harder time waking than normal. You feel weak or dizzy. You feel very thirsty. Get help right away if: You have any symptoms of very bad dehydration. You vomit every time you eat or drink. Your vomiting gets worse, does not go away, or you vomit blood or green stuff. You are getting treatment, but symptoms are getting worse. You have a fever. You have a very bad headache. You have: Diarrhea that gets worse or does not go away. Blood in your poop (stool). This may cause poop to look black and tarry. No pee in 6-8 hours. Only a small amount of pee in 6-8 hours, and the pee is very dark. You have trouble breathing. These symptoms may be an emergency. Get help right away. Call 911. Do not wait to see if the symptoms will go away. Do not drive yourself to the hospital. This information is not intended to replace advice given to you by your health care provider. Make sure you discuss any questions you have with your health care provider. Document Revised: 09/01/2021 Document Reviewed: 09/01/2021 Elsevier Patient Education  2024 [employer].  

## 2022-07-24 ENCOUNTER — Ambulatory Visit (HOSPITAL_COMMUNITY)
Admission: RE | Admit: 2022-07-24 | Discharge: 2022-07-24 | Disposition: A | Discharge status: Home / Self Care | Payer: BC Managed Care – PPO | Source: Ambulatory Visit | Attending: Hematology & Oncology | Admitting: Hematology & Oncology

## 2022-07-24 ENCOUNTER — Ambulatory Visit: Payer: BC Managed Care – PPO

## 2022-07-24 DIAGNOSIS — C73 Malignant neoplasm of thyroid gland: Secondary | ICD-10-CM

## 2022-07-24 LAB — GLUCOSE, CAPILLARY: Glucose-Capillary: 125 mg/dL — ABNORMAL HIGH (ref 70–99)

## 2022-07-24 MED ORDER — GADOBUTROL 1 MMOL/ML IV SOLN
10.0000 mL | Freq: Once | INTRAVENOUS | Status: AC | PRN
  Administered 2022-07-24: 10 mL via INTRAVENOUS

## 2022-07-24 MED ORDER — FLUDEOXYGLUCOSE F - 18 (FDG) INJECTION
13.0000 | Freq: Once | INTRAVENOUS | Status: AC
  Administered 2022-07-24: 12.44 via INTRAVENOUS

## 2022-07-29 ENCOUNTER — Other Ambulatory Visit: Payer: Self-pay

## 2022-07-30 ENCOUNTER — Other Ambulatory Visit: Payer: Self-pay

## 2022-07-31 ENCOUNTER — Inpatient Hospital Stay (HOSPITAL_COMMUNITY): Payer: BC Managed Care – PPO

## 2022-07-31 ENCOUNTER — Telehealth: Payer: Self-pay | Admitting: *Deleted

## 2022-07-31 ENCOUNTER — Other Ambulatory Visit: Payer: Self-pay

## 2022-07-31 ENCOUNTER — Inpatient Hospital Stay (HOSPITAL_COMMUNITY)
Admission: EM | Admit: 2022-07-31 | Discharge: 2022-08-05 | DRG: 180 | Disposition: A | Discharge status: Hospice | Payer: BC Managed Care – PPO | Attending: Internal Medicine | Admitting: Internal Medicine

## 2022-07-31 ENCOUNTER — Encounter (HOSPITAL_COMMUNITY): Payer: Self-pay

## 2022-07-31 ENCOUNTER — Emergency Department (HOSPITAL_COMMUNITY): Payer: BC Managed Care – PPO

## 2022-07-31 DIAGNOSIS — Z885 Allergy status to narcotic agent status: Secondary | ICD-10-CM

## 2022-07-31 DIAGNOSIS — Z833 Family history of diabetes mellitus: Secondary | ICD-10-CM

## 2022-07-31 DIAGNOSIS — Z515 Encounter for palliative care: Secondary | ICD-10-CM | POA: Diagnosis not present

## 2022-07-31 DIAGNOSIS — G893 Neoplasm related pain (acute) (chronic): Secondary | ICD-10-CM | POA: Diagnosis present

## 2022-07-31 DIAGNOSIS — C799 Secondary malignant neoplasm of unspecified site: Secondary | ICD-10-CM | POA: Diagnosis not present

## 2022-07-31 DIAGNOSIS — C3491 Malignant neoplasm of unspecified part of right bronchus or lung: Secondary | ICD-10-CM | POA: Diagnosis present

## 2022-07-31 DIAGNOSIS — D6489 Other specified anemias: Secondary | ICD-10-CM | POA: Diagnosis present

## 2022-07-31 DIAGNOSIS — I5021 Acute systolic (congestive) heart failure: Secondary | ICD-10-CM | POA: Diagnosis not present

## 2022-07-31 DIAGNOSIS — J9601 Acute respiratory failure with hypoxia: Secondary | ICD-10-CM | POA: Diagnosis present

## 2022-07-31 DIAGNOSIS — A419 Sepsis, unspecified organism: Principal | ICD-10-CM

## 2022-07-31 DIAGNOSIS — Z888 Allergy status to other drugs, medicaments and biological substances status: Secondary | ICD-10-CM

## 2022-07-31 DIAGNOSIS — I4729 Other ventricular tachycardia: Secondary | ICD-10-CM

## 2022-07-31 DIAGNOSIS — K5903 Drug induced constipation: Secondary | ICD-10-CM | POA: Diagnosis not present

## 2022-07-31 DIAGNOSIS — E162 Hypoglycemia, unspecified: Secondary | ICD-10-CM | POA: Diagnosis present

## 2022-07-31 DIAGNOSIS — Z881 Allergy status to other antibiotic agents status: Secondary | ICD-10-CM

## 2022-07-31 DIAGNOSIS — K5900 Constipation, unspecified: Secondary | ICD-10-CM | POA: Diagnosis present

## 2022-07-31 DIAGNOSIS — Z7901 Long term (current) use of anticoagulants: Secondary | ICD-10-CM | POA: Diagnosis not present

## 2022-07-31 DIAGNOSIS — E039 Hypothyroidism, unspecified: Secondary | ICD-10-CM | POA: Diagnosis present

## 2022-07-31 DIAGNOSIS — G929 Unspecified toxic encephalopathy: Secondary | ICD-10-CM | POA: Diagnosis present

## 2022-07-31 DIAGNOSIS — I5032 Chronic diastolic (congestive) heart failure: Secondary | ICD-10-CM | POA: Diagnosis present

## 2022-07-31 DIAGNOSIS — E871 Hypo-osmolality and hyponatremia: Secondary | ICD-10-CM | POA: Diagnosis present

## 2022-07-31 DIAGNOSIS — C7951 Secondary malignant neoplasm of bone: Secondary | ICD-10-CM | POA: Diagnosis present

## 2022-07-31 DIAGNOSIS — C787 Secondary malignant neoplasm of liver and intrahepatic bile duct: Secondary | ICD-10-CM | POA: Diagnosis present

## 2022-07-31 DIAGNOSIS — Z91048 Other nonmedicinal substance allergy status: Secondary | ICD-10-CM

## 2022-07-31 DIAGNOSIS — R400 Somnolence: Secondary | ICD-10-CM

## 2022-07-31 DIAGNOSIS — C73 Malignant neoplasm of thyroid gland: Secondary | ICD-10-CM | POA: Diagnosis present

## 2022-07-31 DIAGNOSIS — Z79899 Other long term (current) drug therapy: Secondary | ICD-10-CM | POA: Diagnosis not present

## 2022-07-31 DIAGNOSIS — I472 Ventricular tachycardia, unspecified: Secondary | ICD-10-CM | POA: Diagnosis not present

## 2022-07-31 DIAGNOSIS — R600 Localized edema: Secondary | ICD-10-CM

## 2022-07-31 DIAGNOSIS — C771 Secondary and unspecified malignant neoplasm of intrathoracic lymph nodes: Secondary | ICD-10-CM | POA: Diagnosis present

## 2022-07-31 DIAGNOSIS — Z8249 Family history of ischemic heart disease and other diseases of the circulatory system: Secondary | ICD-10-CM

## 2022-07-31 DIAGNOSIS — R4182 Altered mental status, unspecified: Secondary | ICD-10-CM | POA: Diagnosis present

## 2022-07-31 DIAGNOSIS — Z79890 Hormone replacement therapy (postmenopausal): Secondary | ICD-10-CM

## 2022-07-31 DIAGNOSIS — R531 Weakness: Secondary | ICD-10-CM | POA: Diagnosis not present

## 2022-07-31 DIAGNOSIS — G4700 Insomnia, unspecified: Secondary | ICD-10-CM | POA: Diagnosis present

## 2022-07-31 DIAGNOSIS — Z86718 Personal history of other venous thrombosis and embolism: Secondary | ICD-10-CM

## 2022-07-31 DIAGNOSIS — I110 Hypertensive heart disease with heart failure: Secondary | ICD-10-CM | POA: Diagnosis present

## 2022-07-31 DIAGNOSIS — E876 Hypokalemia: Secondary | ICD-10-CM | POA: Diagnosis present

## 2022-07-31 DIAGNOSIS — Z7189 Other specified counseling: Secondary | ICD-10-CM | POA: Diagnosis not present

## 2022-07-31 DIAGNOSIS — Z86711 Personal history of pulmonary embolism: Secondary | ICD-10-CM

## 2022-07-31 DIAGNOSIS — Z1152 Encounter for screening for COVID-19: Secondary | ICD-10-CM | POA: Diagnosis not present

## 2022-07-31 DIAGNOSIS — J90 Pleural effusion, not elsewhere classified: Secondary | ICD-10-CM

## 2022-07-31 DIAGNOSIS — Z923 Personal history of irradiation: Secondary | ICD-10-CM

## 2022-07-31 DIAGNOSIS — Z886 Allergy status to analgesic agent status: Secondary | ICD-10-CM

## 2022-07-31 DIAGNOSIS — Z66 Do not resuscitate: Secondary | ICD-10-CM | POA: Diagnosis present

## 2022-07-31 DIAGNOSIS — Z9221 Personal history of antineoplastic chemotherapy: Secondary | ICD-10-CM

## 2022-07-31 DIAGNOSIS — Z83438 Family history of other disorder of lipoprotein metabolism and other lipidemia: Secondary | ICD-10-CM

## 2022-07-31 LAB — URINALYSIS, W/ REFLEX TO CULTURE (INFECTION SUSPECTED)
Bacteria, UA: NONE SEEN
Bilirubin Urine: NEGATIVE
Glucose, UA: NEGATIVE mg/dL
Hgb urine dipstick: NEGATIVE
Ketones, ur: NEGATIVE mg/dL
Leukocytes,Ua: NEGATIVE
Nitrite: NEGATIVE
Protein, ur: NEGATIVE mg/dL
Specific Gravity, Urine: 1.012 (ref 1.005–1.030)
pH: 5 (ref 5.0–8.0)

## 2022-07-31 LAB — CBC WITH DIFFERENTIAL/PLATELET
Abs Immature Granulocytes: 0.03 10*3/uL (ref 0.00–0.07)
Basophils Absolute: 0 10*3/uL (ref 0.0–0.1)
Basophils Relative: 0 %
Eosinophils Absolute: 0 10*3/uL (ref 0.0–0.5)
Eosinophils Relative: 0 %
HCT: 30.4 % — ABNORMAL LOW (ref 39.0–52.0)
Hemoglobin: 9.3 g/dL — ABNORMAL LOW (ref 13.0–17.0)
Immature Granulocytes: 0 %
Lymphocytes Relative: 3 %
Lymphs Abs: 0.3 10*3/uL — ABNORMAL LOW (ref 0.7–4.0)
MCH: 28.4 pg (ref 26.0–34.0)
MCHC: 30.6 g/dL (ref 30.0–36.0)
MCV: 93 fL (ref 80.0–100.0)
Monocytes Absolute: 1.1 10*3/uL — ABNORMAL HIGH (ref 0.1–1.0)
Monocytes Relative: 12 %
Neutro Abs: 8 10*3/uL — ABNORMAL HIGH (ref 1.7–7.7)
Neutrophils Relative %: 85 %
Platelets: 161 10*3/uL (ref 150–400)
RBC: 3.27 MIL/uL — ABNORMAL LOW (ref 4.22–5.81)
RDW: 19.4 % — ABNORMAL HIGH (ref 11.5–15.5)
WBC: 9.4 10*3/uL (ref 4.0–10.5)
nRBC: 0 % (ref 0.0–0.2)

## 2022-07-31 LAB — GLUCOSE, CAPILLARY
Glucose-Capillary: 79 mg/dL (ref 70–99)
Glucose-Capillary: 82 mg/dL (ref 70–99)
Glucose-Capillary: 98 mg/dL (ref 70–99)

## 2022-07-31 LAB — RESP PANEL BY RT-PCR (RSV, FLU A&B, COVID)  RVPGX2
Influenza A by PCR: NEGATIVE
Influenza B by PCR: NEGATIVE
Resp Syncytial Virus by PCR: NEGATIVE
SARS Coronavirus 2 by RT PCR: NEGATIVE

## 2022-07-31 LAB — COMPREHENSIVE METABOLIC PANEL
ALT: 24 U/L (ref 0–44)
AST: 35 U/L (ref 15–41)
Albumin: 2.6 g/dL — ABNORMAL LOW (ref 3.5–5.0)
Alkaline Phosphatase: 566 U/L — ABNORMAL HIGH (ref 38–126)
Anion gap: 13 (ref 5–15)
BUN: 29 mg/dL — ABNORMAL HIGH (ref 6–20)
CO2: 28 mmol/L (ref 22–32)
Calcium: 8.2 mg/dL — ABNORMAL LOW (ref 8.9–10.3)
Chloride: 90 mmol/L — ABNORMAL LOW (ref 98–111)
Creatinine, Ser: 0.82 mg/dL (ref 0.61–1.24)
GFR, Estimated: >60 mL/min (ref >60)
Glucose, Bld: 106 mg/dL — ABNORMAL HIGH (ref 70–99)
Potassium: 3.2 mmol/L — ABNORMAL LOW (ref 3.5–5.1)
Sodium: 131 mmol/L — ABNORMAL LOW (ref 135–145)
Total Bilirubin: 2.4 mg/dL — ABNORMAL HIGH (ref 0.3–1.2)
Total Protein: 6.1 g/dL — ABNORMAL LOW (ref 6.5–8.1)

## 2022-07-31 LAB — BLOOD GAS, ARTERIAL
Acid-Base Excess: 9.8 mmol/L — ABNORMAL HIGH (ref 0.0–2.0)
Bicarbonate: 33.5 mmol/L — ABNORMAL HIGH (ref 20.0–28.0)
Drawn by: 20012
FIO2: 21 %
O2 Saturation: 96.8 %
Patient temperature: 36.6
pCO2 arterial: 40 mmHg (ref 32–48)
pH, Arterial: 7.53 — ABNORMAL HIGH (ref 7.35–7.45)
pO2, Arterial: 72 mmHg — ABNORMAL LOW (ref 83–108)

## 2022-07-31 LAB — PROCALCITONIN: Procalcitonin: 3.21 ng/mL

## 2022-07-31 LAB — BODY FLUID CELL COUNT WITH DIFFERENTIAL
Eos, Fluid: 0 %
Lymphs, Fluid: 8 %
Monocyte-Macrophage-Serous Fluid: 23 % — ABNORMAL LOW (ref 50–90)
Neutrophil Count, Fluid: 43 % — ABNORMAL HIGH (ref 0–25)
Other Cells, Fluid: 26 %
Total Nucleated Cell Count, Fluid: 559 cu mm (ref 0–1000)

## 2022-07-31 LAB — TROPONIN I (HIGH SENSITIVITY)
Troponin I (High Sensitivity): 56 ng/L — ABNORMAL HIGH (ref <18)
Troponin I (High Sensitivity): 57 ng/L — ABNORMAL HIGH (ref <18)

## 2022-07-31 LAB — MAGNESIUM: Magnesium: 1.6 mg/dL — ABNORMAL LOW (ref 1.7–2.4)

## 2022-07-31 LAB — PROTIME-INR
INR: 2.2 — ABNORMAL HIGH (ref 0.8–1.2)
Prothrombin Time: 24.6 seconds — ABNORMAL HIGH (ref 11.4–15.2)

## 2022-07-31 LAB — TSH: TSH: 10.733 u[IU]/mL — ABNORMAL HIGH (ref 0.350–4.500)

## 2022-07-31 LAB — TYPE AND SCREEN
ABO/RH(D): O NEG
Antibody Screen: NEGATIVE

## 2022-07-31 LAB — LACTATE DEHYDROGENASE: LDH: 468 U/L — ABNORMAL HIGH (ref 98–192)

## 2022-07-31 LAB — ECHOCARDIOGRAM LIMITED
Est EF: 55
Height: 70 in
S' Lateral: 3.5 cm
Weight: 3840 oz

## 2022-07-31 LAB — CBG MONITORING, ED: Glucose-Capillary: 87 mg/dL (ref 70–99)

## 2022-07-31 LAB — AMMONIA: Ammonia: 28 umol/L (ref 9–35)

## 2022-07-31 LAB — LACTIC ACID, PLASMA
Lactic Acid, Venous: 2.9 mmol/L (ref 0.5–1.9)
Lactic Acid, Venous: 2.4 mmol/L (ref 0.5–1.9)

## 2022-07-31 LAB — MRSA NEXT GEN BY PCR, NASAL: MRSA by PCR Next Gen: NOT DETECTED

## 2022-07-31 LAB — T4, FREE: Free T4: 0.66 ng/dL (ref 0.61–1.12)

## 2022-07-31 LAB — PROTEIN, TOTAL: Total Protein: 5.7 g/dL — ABNORMAL LOW (ref 6.5–8.1)

## 2022-07-31 LAB — BRAIN NATRIURETIC PEPTIDE: B Natriuretic Peptide: 104.9 pg/mL — ABNORMAL HIGH (ref 0.0–100.0)

## 2022-07-31 LAB — APTT: aPTT: 58 seconds — ABNORMAL HIGH (ref 24–36)

## 2022-07-31 MED ORDER — ACETAMINOPHEN 10 MG/ML IV SOLN
1000.0000 mg | Freq: Once | INTRAVENOUS | Status: AC
  Administered 2022-07-31: 1000 mg via INTRAVENOUS
  Filled 2022-07-31: qty 100

## 2022-07-31 MED ORDER — HYDROMORPHONE HCL 2 MG PO TABS: 4.0000 mg | ORAL_TABLET | ORAL | Status: DC | PRN

## 2022-07-31 MED ORDER — PROCHLORPERAZINE EDISYLATE 10 MG/2ML IJ SOLN: 5.0000 mg | Freq: Four times a day (QID) | INTRAMUSCULAR | Status: AC | PRN

## 2022-07-31 MED ORDER — SODIUM CHLORIDE 0.9 % IV SOLN
2.0000 g | Freq: Once | INTRAVENOUS | Status: AC
  Administered 2022-07-31: 2 g via INTRAVENOUS
  Filled 2022-07-31: qty 12.5

## 2022-07-31 MED ORDER — ORAL CARE MOUTH RINSE: 15.0000 mL | OROMUCOSAL | Status: DC | PRN

## 2022-07-31 MED ORDER — INSULIN ASPART 100 UNIT/ML IJ SOLN
0.0000 [IU] | INTRAMUSCULAR | Status: DC
  Filled 2022-07-31: qty 0.09

## 2022-07-31 MED ORDER — SODIUM CHLORIDE 0.9 % IV SOLN
25.0000 mg | Freq: Four times a day (QID) | INTRAVENOUS | Status: DC | PRN
  Administered 2022-07-31: 25 mg via INTRAVENOUS
  Filled 2022-07-31: qty 25

## 2022-07-31 MED ORDER — SODIUM CHLORIDE 0.9 % IV SOLN: 2.0000 g | Freq: Three times a day (TID) | INTRAVENOUS | Status: DC

## 2022-07-31 MED ORDER — KETOROLAC TROMETHAMINE 15 MG/ML IJ SOLN
15.0000 mg | Freq: Once | INTRAMUSCULAR | Status: AC
  Administered 2022-07-31: 15 mg via INTRAVENOUS
  Filled 2022-07-31: qty 1

## 2022-07-31 MED ORDER — FENTANYL CITRATE PF 50 MCG/ML IJ SOSY
50.0000 ug | PREFILLED_SYRINGE | INTRAMUSCULAR | Status: DC | PRN
  Administered 2022-07-31: 50 ug via INTRAVENOUS
  Filled 2022-07-31: qty 1

## 2022-07-31 MED ORDER — DOCUSATE SODIUM 100 MG PO CAPS: 100.0000 mg | ORAL_CAPSULE | Freq: Two times a day (BID) | ORAL | Status: DC | PRN

## 2022-07-31 MED ORDER — LACTATED RINGERS IV BOLUS (SEPSIS): 1000.0000 mL | Freq: Once | INTRAVENOUS | Status: DC

## 2022-07-31 MED ORDER — LACTATED RINGERS IV BOLUS (SEPSIS)
1000.0000 mL | Freq: Once | INTRAVENOUS | Status: AC
  Administered 2022-07-31: 1000 mL via INTRAVENOUS

## 2022-07-31 MED ORDER — VANCOMYCIN HCL IN DEXTROSE 1-5 GM/200ML-% IV SOLN: 1000.0000 mg | Freq: Once | INTRAVENOUS | Status: DC

## 2022-07-31 MED ORDER — SODIUM CHLORIDE 0.9 % IV SOLN: INTRAVENOUS | Status: DC

## 2022-07-31 MED ORDER — ORAL CARE MOUTH RINSE: 15.0000 mL | OROMUCOSAL | Status: DC

## 2022-07-31 MED ORDER — SODIUM CHLORIDE 0.9 % IV SOLN
2.0000 g | Freq: Three times a day (TID) | INTRAVENOUS | Status: DC
  Administered 2022-07-31 – 2022-08-05 (×14): 2 g via INTRAVENOUS
  Filled 2022-07-31 (×14): qty 12.5

## 2022-07-31 MED ORDER — POTASSIUM CHLORIDE 10 MEQ/100ML IV SOLN
10.0000 meq | INTRAVENOUS | Status: AC
  Administered 2022-07-31 (×3): 10 meq via INTRAVENOUS
  Filled 2022-07-31 (×3): qty 100

## 2022-07-31 MED ORDER — SCOPOLAMINE 1 MG/3DAYS TD PT72
1.0000 | MEDICATED_PATCH | TRANSDERMAL | Status: AC
  Administered 2022-07-31: 1.5 mg via TRANSDERMAL
  Filled 2022-07-31: qty 1

## 2022-07-31 MED ORDER — AMIODARONE IV BOLUS ONLY 150 MG/100ML
150.0000 mg | Freq: Once | INTRAVENOUS | Status: AC
  Administered 2022-07-31: 150 mg via INTRAVENOUS
  Filled 2022-07-31: qty 100

## 2022-07-31 MED ORDER — METRONIDAZOLE 500 MG/100ML IV SOLN
500.0000 mg | Freq: Once | INTRAVENOUS | Status: AC
  Administered 2022-07-31: 500 mg via INTRAVENOUS
  Filled 2022-07-31: qty 100

## 2022-07-31 MED ORDER — VANCOMYCIN HCL 2000 MG/400ML IV SOLN
2000.0000 mg | Freq: Once | INTRAVENOUS | Status: AC
  Administered 2022-07-31: 2000 mg via INTRAVENOUS
  Filled 2022-07-31: qty 400

## 2022-07-31 MED ORDER — LEVOTHYROXINE SODIUM 100 MCG PO TABS
200.0000 ug | ORAL_TABLET | Freq: Every day | ORAL | Status: DC
  Administered 2022-08-02 – 2022-08-05 (×4): 200 ug via ORAL
  Filled 2022-07-31 (×4): qty 2

## 2022-07-31 MED ORDER — LACTATED RINGERS IV SOLN: INTRAVENOUS | Status: DC

## 2022-07-31 MED ORDER — DEXTROSE-SODIUM CHLORIDE 5-0.9 % IV SOLN: INTRAVENOUS | Status: DC

## 2022-07-31 MED ORDER — POLYETHYLENE GLYCOL 3350 17 G PO PACK: 17.0000 g | PACK | Freq: Every day | ORAL | Status: DC | PRN

## 2022-07-31 MED ORDER — LACTATED RINGERS IV BOLUS (SEPSIS): 500.0000 mL | Freq: Once | INTRAVENOUS | Status: DC

## 2022-07-31 MED ORDER — HYDROMORPHONE HCL 1 MG/ML IJ SOLN
0.5000 mg | INTRAMUSCULAR | Status: DC | PRN
  Administered 2022-07-31 – 2022-08-01 (×2): 0.5 mg via INTRAVENOUS
  Filled 2022-07-31 (×2): qty 1

## 2022-07-31 MED ORDER — MAGNESIUM SULFATE 2 GM/50ML IV SOLN
2.0000 g | Freq: Once | INTRAVENOUS | Status: AC
  Administered 2022-07-31: 2 g via INTRAVENOUS
  Filled 2022-07-31: qty 50

## 2022-07-31 NOTE — Progress Notes (Signed)
   07/31/22 1500  BiPAP/CPAP/SIPAP  Reason BIPAP/CPAP not in use (S)   (On hold at this time per CCM, pt is maintaining SP02 100%, comfortable.)  BiPAP/CPAP /SiPAP Vitals  Pulse Rate (!) 110  Resp (!) 32  BP 104/68  SpO2 100 %  MEWS Score/Color  MEWS Score 4  MEWS Score Color Red

## 2022-07-31 NOTE — ED Notes (Signed)
Pt placed on 2L [place]ce per order

## 2022-07-31 NOTE — ED Notes (Signed)
Pt to ct via stretcher

## 2022-07-31 NOTE — Progress Notes (Signed)
Elink following for sepsis protocol. 

## 2022-07-31 NOTE — Progress Notes (Signed)
No need of bipap at this time. Pt is resting. No resp distress noted.

## 2022-07-31 NOTE — ED Notes (Signed)
RT called to place pt on BIPAP

## 2022-07-31 NOTE — Consult Note (Signed)
[name] is well-known to me.  He is a very nice 47 year old white male old white male.  He has had a long history of metastatic Hurthle cell carcinoma.  He has been on multiple lines of chemotherapy.  Surprising, he has had responses to what we have done in the past.  He recently has been treated with cisplatin/Adriamycin/Cytoxan.  I think he is 3 cycles.  He was in the office last week and not doing well.  He had PET scan and MRI that was done I think today or yesterday.  Clearly, his disease is progressing now.  We really have nothing else to offer him.  He came to the hospital today.  He is declining at home.  He has been quite lethargic.  He does have quite a bit of pain and has been on pain medication.  It is apparent that his prognosis is incredibly limited.  I would have to suspect that his prognosis is probably going to be less than 2 weeks.  His last prealbumin a week ago was 12.  When he came in, his labs showed a sodium 131.  Potassium 3.2.  BUN 29.  Creatinine 0.82.  Calcium 8.2 with albumin of 2.6.  His ammonia was 28.  His CBC shows white count 9.4.  Hemoglobin 9.3.  Platelet count 161,000.  He is quite lethargic.  Again he has quite a bit of pain secondary to his metastatic disease.  I was able to talk to his family.  I explained to them that I does do not think that he was a candidate for any further therapy.  His decline has been very quick.  I talked with him about end-of-life issues.  I would really hate to see him be kept alive on a ventilator.  I do not think he can make that decision himself is I am not sure how cognizant he is.  I talked with his family about this.  They will have to decide.  They will have to talk about this.  If he does make it out of the hospital, he would be a candidate for Hospice.  Again his decline has been incredible.  Is lost quite a bit of weight.  Again I am not surprised.  He has a very aggressive Hurthle cell carcinoma that he actually has done  incredibly well with.  Currently, his vital signs are temperature of 97.7.  Will pulse 121.  Blood pressure 104/64.  Oxygen saturation is 96%.  His head and neck exam shows some dry oral mucosa.  There is no obvious mucositis.  There is no adenopathy in the neck.  Lungs are clear bilaterally.  Cardiac exam regular rate and rhythm.  Abdomen is soft.  Bowel sounds are present.  Bowel sounds might be decreased.  He does have tenderness over on the right side.  There is some slight hepatic enlargement.  Extremities shows some mild edema in the lower legs.  Neurological exam shows overall lethargy.    [name] is a 47 year old white male year old white male.  He has end-stage Hurthle cell carcinoma.  I hate the fact had his birthday is in 6 days.  I am not sure is going to make it to his birthday.  We will have to see how things go over the weekend for him.  Again, I talked to his family.  We talked about end-of-life issues.  They will have to discuss about his life support preferences.  If he becomes more coherent, we can certainly talk to him about  this.  Again, I just want him to have comfort, respect, and dignity.  He deserves this.  I know that the staff in the ICU will clearly do a fantastic job with him.    Christin Bach, MD  2 Timothy 4:16-18

## 2022-07-31 NOTE — ED Notes (Signed)
Pt went into vtach for 2 minutes. Alert and oriented the entire episode. Right sided facial droop noted at this time. Dr. Craige Cotta paged and messaged on secure chat.

## 2022-07-31 NOTE — ED Triage Notes (Signed)
Arrived POV with father. Pt father states that pt daughter says pt has not been acting right today. They are unsure if he took too much medication. Father states pt is orientedx4 but is acting lethargic.  Pt has throat cancer that has metastasized to his liver. Last chemo tx a month or so ago and pt is not currently getting any tx due to waiting on PET scan that was done last week

## 2022-07-31 NOTE — Progress Notes (Signed)
Pharmacy Antibiotic Note  Brian [name] is a 47 y.o. male with metastatic thyroid cancer and iron deficiency anemia who presented to the ED on 07/31/2022 with fever, AMS and generalized weakness. CXR showed pulmonary edema and moderate right pleural effusion.  He underwent thoracentesis on 07/31/22.  Pharmacy has been consulted to dose cefepime for sepsis.  Plan: - cefepime 2 fm IV q8h  ______________________________________  Height: 5\' 10"  (177.8 cm) Weight: 108.9 kg (240 lb) IBW/kg (Calculated) : 73  Temp (24hrs), Avg:97.7 F (36.5 C), Min:97.7 F (36.5 C), Max:97.7 F (36.5 C)  Recent Labs  Lab 07/31/22 1249 07/31/22 1507  WBC 9.4  --   CREATININE 0.82  --   LATICACIDVEN 2.9* 2.4*    Estimated Creatinine Clearance: 139.2 mL/min (by C-G formula based on SCr of 0.82 mg/dL).    Allergies  Allergen Reactions   Dextromethorphan-Guaifenesin Other (See Comments)    Irregular heartbeat    Doxycycline Anaphylaxis, Nausea And Vomiting, Rash and Other (See Comments)    "heart arrythmia" and "dyspepsia" (only oral doxycycline causes reaction)   Gadobutrol Hives and Other (See Comments)    Patient had MRI scan at [facility]. Patient called one hour after he left imaging facility to report two "blisters" that came up on "back" of lip.   Gadolinium Derivatives Hives    Patient had MRI scan at [facility]. Patient called one hour after he left imaging facility to report two "blisters" that came up on "back" of lip.    Guaifenesin Palpitations        Ibuprofen Hives and Itching   Lisinopril Other (See Comments)    Angioedema   Pantoprazole Itching   Tramadol Hives and Itching   Nexavar [Sorafenib] Hives   Barium Rash    Developed redness around neck after drinking 1st bottle of Barocat; pt was given Benedryl by ED Mds to be "on the safe side" before drinking 2nd bottle   Chlorhexidine Hives     Thank you for allowing pharmacy to be a part of this patient's  care.  Lucia Gaskins 07/31/2022 5:28 PM

## 2022-07-31 NOTE — Telephone Encounter (Signed)
Received call from daughter ,Leotis Shames, and she stated,"dad is disoriented, urinating on himself, increased SOB with hiccups, nauseated, fevers of 100.0 for the last two days, and to out of it to eat something so he can take pain medication. Can he come in for pain meds and IVF?" Per Dr. Myna Hidalgo, I instructed her to take him to [facility] ER. She verbalized understanding.

## 2022-07-31 NOTE — ED Provider Notes (Signed)
[place] EMERGENCY DEPARTMENT AT [facility] Provider Note   CSN: [id number] Arrival date & time: 07/31/22  1210     History  Chief Complaint  Patient presents with   Fatigue    Brian [name] is a 47 y.o. male.  HPI   Patient presented to the ED for evaluation of altered mental status.  Patient has complex medical history of metastatic thyroid cancer, hypertension, bilateral pulmonary embolism, DVT.  Patient has been receiving treatments at the cancer center.  Patient has been having trouble with recurrent nausea and vomiting.  Brian [name] has been to the cancer center requiring IV fluids.  Patient was brought in by family today because Brian [name] was not acting normally.  Patient's daughter initially noted that Brian [name] has been very lethargic.  Patient was actually brought in by his father.  Patient is very somnolent.  Brian [name] will answer questions but will fall back asleep.  Brian [name] indicates not feeling well today.  Denies any chest pain.  Unknown has had any recent vomiting  Home Medications Prior to Admission medications   Medication Sig Start Date End Date Taking? Authorizing Provider  apixaban (ELIQUIS) 5 MG TABS tablet TAKE 1 TABLET (5 MG TOTAL) BY MOUTH 2 (TWO) TIMES DAILY. 04/03/22 04/03/23  Josph Macho, MD  baclofen (LIORESAL) 10 MG tablet Take 1 tablet (10 mg total) by mouth 4 (four) times daily as needed for muscle spasms. 05/27/22   Josph Macho, MD  chlorproMAZINE (THORAZINE) 25 MG tablet Take 1 tablet (25 mg total) by mouth 4 (four) times daily as needed. Patient not taking: Reported on 06/16/2022 05/25/22   Josph Macho, MD  cyclobenzaprine (FLEXERIL) 10 MG tablet Take 1 tablet (10 mg total) by mouth 3 (three) times daily as needed for muscle spasms. 07/02/22   Josph Macho, MD  dexamethasone (DECADRON) 4 MG tablet Take 2 tablets (8 mg total) by mouth daily for 3 days starting the day after cisplatin chemotherapy. Take with food. 05/19/22   Josph Macho, MD  diphenhydrAMINE  (BENADRYL) 25 mg capsule Take 2 capsules (50 mg total) by mouth once for 1 dose. 02/17/22 02/17/22  Josph Macho, MD  EPINEPHrine (EPIPEN 2-PAK) 0.3 mg/0.3 mL IJ SOAJ injection USE AS DIRECTED FOR LIFE THREATENING ALLERGIC REACTIONS Patient not taking: Reported on 06/16/2022 09/04/20   Josph Macho, MD  famotidine (PEPCID) 40 MG tablet Take 1 tablet (40 mg total) by mouth 2 (two) times daily. 09/22/21   Josph Macho, MD  HYDROmorphone (DILAUDID) 8 MG tablet Take 1 tablet (8 mg total) by mouth every 4 (four) hours as needed for severe pain. 07/21/22   Josph Macho, MD  Iron-FA-B Cmp-C-Biot-Probiotic (FUSION PLUS) CAPS Take 1 capsule by mouth daily. 09/22/21   Erenest Blank, NP  levothyroxine (SYNTHROID) 200 MCG tablet Take 1 tablet (200 mcg total) by mouth daily. Take with 88 mcg tablet for a total dose of 288 mcg/day. 07/02/22   Josph Macho, MD  LORazepam (ATIVAN) 1 MG tablet Take 1 tablet (1 mg total) by mouth every 6 (six) hours as needed for anxiety (for nausea and vomiting). 07/09/22   Josph Macho, MD  magic mouthwash SOLN Swish and spit 15 mls by mouth every four hours as needed for mouth pain. 06/01/22   Josph Macho, MD  methylphenidate (RITALIN) 10 MG tablet Take 1 tablet (10 mg total) by mouth 2 (two) times daily. 04/03/22   Josph Macho, MD  metoCLOPramide (  REGLAN) 10 MG tablet Take 1 tablet (10 mg total) by mouth every 6 (six) hours as needed for nausea or vomiting. 03/09/22   Josph Macho, MD  metolazone (ZAROXOLYN) 5 MG tablet Take 5 mg by mouth daily. 12/22/18   [provider]  metoprolol tartrate (LOPRESSOR) 50 MG tablet Take 1 tablet (50 mg total) by mouth 2 (two) times daily. 04/03/22   Josph Macho, MD  morphine (MS CONTIN) 15 MG 12 hr tablet Take 1 tablet (15 mg total) by mouth every 12 (twelve) hours. 07/23/22   Josph Macho, MD  Multiple Vitamin (MULTIVITAMIN) capsule Take 1 capsule by mouth daily.    [provider]  OLANZapine  (ZYPREXA) 10 MG tablet TAKE 1 TABLET (10 MG TOTAL) BY MOUTH AT BEDTIME. 11/04/21 11/04/22  Josph Macho, MD  ondansetron (ZOFRAN) 8 MG tablet Take 1 tablet (8 mg total) by mouth every 8 (eight) hours as needed for nausea or vomiting. Start on the third day after cisplatin. 05/19/22   Josph Macho, MD  polyethylene glycol powder (GLYCOLAX/MIRALAX) 17 GM/SCOOP powder Mix 17 grams (1 Capful) in 8 ounces of liquid and drink by mouth daily as needed. 07/22/21   Josph Macho, MD  potassium chloride SA (KLOR-CON M) 20 MEQ tablet Take 1 tablet (20 mEq total) by mouth daily. 11/06/21   Josph Macho, MD  prednisoLONE acetate (PRED FORTE) 1 % ophthalmic suspension  04/16/22   [provider]  predniSONE (DELTASONE) 50 MG tablet TAKE 1 TABLET BY MOUTH 13 HOURS BEFORE CT SCAN, 1 TABLET 7 HOURS BEFORE CT SCAN, AND 1 TABLET 1 HOUR BEFORE CT SCAN Patient not taking: Reported on 06/16/2022 02/17/22 02/17/23  Josph Macho, MD  prochlorperazine (COMPAZINE) 10 MG tablet Take 1 tablet (10 mg total) by mouth every 6 (six) hours as needed (Nausea or vomiting). 05/19/22   Josph Macho, MD  promethazine (PHENERGAN) 25 MG tablet Take 1 tablet (25 mg total) by mouth every 6 (six) hours as needed for nausea and vomiting. 05/18/22 05/18/23  Josph Macho, MD  senna-docusate (SENOKOT-S) 8.6-50 MG tablet Take 1 tablet by mouth at bedtime as needed for mild constipation. 04/15/20   Noralee Stain, DO  tamsulosin (FLOMAX) 0.4 MG CAPS capsule Take 1 capsule (0.4 mg total) by mouth daily after supper. 07/24/21   Josph Macho, MD  temazepam (RESTORIL) 30 MG capsule Take 1 capsule (30 mg total) by mouth at bedtime as needed for sleep. 07/02/22   Josph Macho, MD      Allergies    Dextromethorphan-guaifenesin, Doxycycline, Gadobutrol, Gadolinium derivatives, Guaifenesin, Ibuprofen, Lisinopril, Pantoprazole, Tramadol, Nexavar [sorafenib], Barium, and Chlorhexidine    Review of Systems   Review of Systems  Physical  Exam Updated Vital Signs BP 95/69   Pulse (!) 109   Temp 97.7 F (36.5 C) (Oral)   Resp (!) 30   Ht 1.778 m (5\' 10" )   Wt 108.9 kg   SpO2 100%   BMI 34.44 kg/m  Physical Exam Vitals and nursing note reviewed.  Constitutional:      General: Brian [name] is in acute distress.     Appearance: Brian [name] is well-developed. Brian [name] is ill-appearing.  HENT:     Head: Normocephalic and atraumatic.     Right Ear: External ear normal.     Left Ear: External ear normal.  Eyes:     General: No scleral icterus.       Right eye: No discharge.  Left eye: No discharge.     Conjunctiva/sclera: Conjunctivae normal.  Neck:     Trachea: No tracheal deviation.  Cardiovascular:     Rate and Rhythm: Normal rate and regular rhythm.  Pulmonary:     Effort: Tachypnea and accessory muscle usage present.     Breath sounds: No stridor. Rales present. No wheezing.  Abdominal:     General: Bowel sounds are normal. There is no distension.     Palpations: Abdomen is soft.     Tenderness: There is no abdominal tenderness. There is no guarding or rebound.  Musculoskeletal:        General: Swelling present. No tenderness or deformity.     Cervical back: Neck supple.     Right lower leg: Edema present.     Left lower leg: Edema present.  Skin:    General: Skin is warm and dry.     Findings: No rash.  Neurological:     General: No focal deficit present.     Mental Status: Brian [name] is alert.     Cranial Nerves: No cranial nerve deficit, dysarthria or facial asymmetry.     Sensory: No sensory deficit.     Motor: No abnormal muscle tone or seizure activity.     Coordination: Coordination normal.  Psychiatric:        Mood and Affect: Mood normal.     ED Results / Procedures / Treatments   Labs (all labs ordered are listed, but only abnormal results are displayed) Labs Reviewed  LACTIC ACID, PLASMA - Abnormal; Notable for the following components:      Result Value   Lactic Acid, Venous 2.9 (*)    All other  components within normal limits  COMPREHENSIVE METABOLIC PANEL - Abnormal; Notable for the following components:   Sodium 131 (*)    Potassium 3.2 (*)    Chloride 90 (*)    Glucose, Bld 106 (*)    BUN 29 (*)    Calcium 8.2 (*)    Total Protein 6.1 (*)    Albumin 2.6 (*)    Alkaline Phosphatase 566 (*)    Total Bilirubin 2.4 (*)    All other components within normal limits  CBC WITH DIFFERENTIAL/PLATELET - Abnormal; Notable for the following components:   RBC 3.27 (*)    Hemoglobin 9.3 (*)    HCT 30.4 (*)    RDW 19.4 (*)    Neutro Abs 8.0 (*)    Lymphs Abs 0.3 (*)    Monocytes Absolute 1.1 (*)    All other components within normal limits  PROTIME-INR - Abnormal; Notable for the following components:   Prothrombin Time 24.6 (*)    INR 2.2 (*)    All other components within normal limits  APTT - Abnormal; Notable for the following components:   aPTT 58 (*)    All other components within normal limits  BLOOD GAS, ARTERIAL - Abnormal; Notable for the following components:   pH, Arterial 7.53 (*)    pO2, Arterial 72 (*)    Bicarbonate 33.5 (*)    Acid-Base Excess 9.8 (*)    All other components within normal limits  RESP PANEL BY RT-PCR (RSV, FLU A&B, COVID)  RVPGX2  CULTURE, BLOOD (ROUTINE X 2)  CULTURE, BLOOD (ROUTINE X 2)  LACTIC ACID, PLASMA  URINALYSIS, W/ REFLEX TO CULTURE (INFECTION SUSPECTED)  TSH  T4, FREE  MAGNESIUM  BRAIN NATRIURETIC PEPTIDE  AMMONIA  TYPE AND SCREEN  TROPONIN I (HIGH SENSITIVITY)  EKG EKG Interpretation  Date/Time:  Friday July 31 2022 12:27:40 EDT Ventricular Rate:  110 PR Interval:  149 QRS Duration: 79 QT Interval:  346 QTC Calculation: 468 R Axis:   22 Text Interpretation: Sinus tachycardia Probable left atrial enlargement ST elev, probable normal early repol pattern No significant change since last tracing Confirmed by Linwood Dibbles [phone]) on 07/31/2022 1:15:08 PM  Radiology DG Chest Port 1 View  Result Date:  07/31/2022 CLINICAL DATA:  Questionable sepsis EXAM: PORTABLE CHEST 1 VIEW COMPARISON:  Chest radiograph dated 02/18/2022 FINDINGS: Left chest wall port tip projects over the superior cavoatrial junction. Asymmetrically low right lung volumes. Bilateral interstitial opacities. Moderate right pleural effusion. No pneumothorax. Right heart border is obscured. No acute osseous abnormality. IMPRESSION: 1. Pulmonary edema. 2. Moderate right pleural effusion. Electronically Signed   By: Agustin Cree M.D.   On: 07/31/2022 14:21    Procedures .Critical Care  Performed by: Linwood Dibbles, MD Authorized by: Linwood Dibbles, MD   Critical care provider statement:    Critical care time (minutes):  45   Critical care was time spent personally by me on the following activities:  Development of treatment plan with patient or surrogate, discussions with consultants, evaluation of patient's response to treatment, examination of patient, ordering and review of laboratory studies, ordering and review of radiographic studies, ordering and performing treatments and interventions, pulse oximetry, re-evaluation of patient's condition and review of old charts     Medications Ordered in ED Medications  lactated ringers infusion (has no administration in time range)  lactated ringers bolus 1,000 mL (1,000 mLs Intravenous New Bag/Given 07/31/22 1354)    And  lactated ringers bolus 1,000 mL (has no administration in time range)    And  lactated ringers bolus 1,000 mL (has no administration in time range)    And  lactated ringers bolus 500 mL (has no administration in time range)  metroNIDAZOLE (FLAGYL) IVPB 500 mg (500 mg Intravenous New Bag/Given 07/31/22 1417)  vancomycin (VANCOREADY) IVPB 2000 mg/400 mL (has no administration in time range)  potassium chloride 10 mEq in 100 mL IVPB (has no administration in time range)  ceFEPIme (MAXIPIME) 2 g in sodium chloride 0.9 % 100 mL IVPB (0 g Intravenous Stopped 07/31/22 1419)    ED  Course/ Medical Decision Making/ A&P Clinical Course as of 07/31/22 1446  Fri Jul 31, 2022  1352 CBC with Differential(!) CBC without leukocytosis.  Anemia noted, similar to previous values [JK]  1353 Blood gas, arterial (at [facility])(!) ABG shows respiratory alkalosis, pO2 decreased, increased aa gradient [JK]  1429 Bilirubin is elevated compared to previous [JK]  1429 Chest a suggestive of possible pulmonary edema and patient also has a moderate right pleural effusion [JK]  1436 Patient still listless but is answering questions appropriately.  Does seem to be breathing somewhat more comfortably.  Will start bypass as Brian [name] does seem to be protecting airway [JK]  1446 Case discussed with Dr. Craige Cotta.  Will come and evaluate patient [JK]    Clinical Course User Index [JK] Linwood Dibbles, MD                             Medical Decision Making Problems Addressed: Hyponatremia: chronic illness or injury with exacerbation, progression, or side effects of treatment Pleural effusion: acute illness or injury that poses a threat to life or bodily functions Sepsis, due to unspecified organism, unspecified whether acute organ dysfunction present (  HCC): acute illness or injury that poses a threat to life or bodily functions  Amount and/or Complexity of Data Reviewed Labs: ordered. Decision-making details documented in ED Course. Radiology: ordered. ECG/medicine tests: ordered.  Risk Prescription drug management.   Patient presented to the ED for evaluation of confusion, possible difficulty breathing.  Patient has been receiving fluid boluses as an outpatient.  On exam Brian [name] does have evidence of peripheral edema and is tachypneic.  Concerned about the possibility of CHF and fluid overload but also concerned about the possibility of sepsis considering his immunocompromise state and elevated lactic acid level.  Patient's labs do show evidence of hypokalemia and hyponatremia.  Patient has been treated with IV  fluids as well as IV potassium.  Patient did have a brief episode of wide-complex tachycardia.  Will replete potassium and monitor closely.  Patient's ABG does show increased AA gradient.  Patient does remain tachypneic although oxygen saturation is normal with supplemental oxygen.  Initially avoided BiPAP with his lethargy but Brian [name] does seem more alert now.  Will try course of BiPAP and monitor closely.  Will add on troponin and BNP.  Continue with sepsis treatment.  Have added on echocardiogram.  I will consult with pulmonary critical care for admission and further treatment        Final Clinical Impression(s) / ED Diagnoses Final diagnoses:  Sepsis, due to unspecified organism, unspecified whether acute organ dysfunction present [facility])  Pleural effusion  Hyponatremia    Rx / DC Orders ED Discharge Orders     None         Linwood Dibbles, MD 07/31/22 1446

## 2022-07-31 NOTE — Progress Notes (Signed)
eLink Physician-Brief Progress Note Patient Name: Brian [name] DOB: 12/09/1975 MRN: [id number]   Date of Service  07/31/2022  HPI/Events of Note  Patient with 8/10 pain, intractable nausea and vomiting. He was admitted for possible narcotic oversedation.  eICU Interventions  Oral Dilaudid discontinued secondary to intractable nausea and vomiting. IV Phenergan discontinued due to lack of efficacy and contribution to potential oversedation. Compazine + Scopolamine patch ordered for control of nausea. Low dose PRN iv Dilaudid ordered. One dose of Toradol 15 mg iv + Ofirmev 1000 mg iv ordered for multi-modal analgesia strategy.        Migdalia Dk 07/31/2022, 8:43 PM

## 2022-07-31 NOTE — Procedures (Signed)
Thoracentesis  Procedure Note  Brian [name]  [id number]  July 23, 1975  Date:07/31/22  Time:5:11 PM   Provider Performing:Menashe Kafer   Procedure: Thoracentesis with imaging guidance (44034)  Indication(s) Pleural Effusion  Consent Risks of the procedure as well as the alternatives and risks of each were explained to the patient and/or caregiver.  Consent for the procedure was obtained and is signed in the bedside chart  Anesthesia Topical only with 1% lidocaine    Time Out Verified patient identification, verified procedure, site/side was marked, verified correct patient position, special equipment/implants available, medications/allergies/relevant history reviewed, required imaging and test results available.   Sterile Technique Maximal sterile technique including full sterile barrier drape, hand hygiene, sterile gown, sterile gloves, mask, hair covering, sterile ultrasound probe cover (if used).  Procedure Description Ultrasound was used to identify appropriate pleural anatomy for placement and overlying skin marked.  Area of drainage cleaned and draped in sterile fashion. Lidocaine was used to anesthetize the skin and subcutaneous tissue.  1500 cc's of red wine appearing fluid was drained from the right pleural space. Catheter then removed and bandaid applied to site.   Complications/Tolerance None; patient tolerated the procedure well. Chest X-ray is ordered to confirm no post-procedural complication.   EBL Minimal   Specimen(s) Pleural fluid  Coralyn Helling, MD [facility] Pulmonary/Critical Care Pager - [phone] or [phone] 07/31/2022, 5:12 PM

## 2022-07-31 NOTE — Consult Note (Deleted)
NAME:  Brian [name], MRN:  [id number], DOB:  [birth year], LOS: 0 ADMISSION DATE:  07/31/2022 CONSULTATION DATE:  07/31/2022 REFERRING MD:  Lynelle Doctor - EDP CHIEF COMPLAINT:  Low grade fever, AMS   History of Present Illness:  47 year old man who presented to [facility] ED 6/14 for low grade fever, AMS. 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 Myna Hidalgo).  Per chart review/patient's father (at bedside), patient had been disoriented for a few days, worsening today with increased LE edema, SOB and incontinence. Noted fever at home ~100.51F. Has had some recent history of recurrent n/v and poor PO intake requiring IV fluid administration. Pain has been poorly controlled and patient has been much more fatigued than usual. Recent PET 6/7 showed progression of hepatic metastatic disease, thoracic nodal metastasis, new R pleural effusion with similar pulmonary metastasis, mild progression of osseous metastasis.  Presented to [facility] ED 6/14 on recommendation from Oncology. On arrival, patient was toxic-appearing but afebrile with HR 109, BP 95/69, RR 30, SpO2 100%. Labs were notable for WBC 9.4, Hgb 9.3, Plt 171, INR 2.2 (on Eliquis), Na 131, K 3.2, CO2 28, BUN/Cr 29/0.82, Mg 1.6. Trop 56, BNP 104. TSH 10.7. LA 2.9 > 2.4. UA unremarkable and BCx pending; PCT 3.21 and broad-spectrum antibiotics started. ABG with pH 7.53/pCO2 40/pO2 72/bicarb 33. CXR with pulmonary edema, moderate R pleural effusion. CT Head NAICA.  PCCM consulted for ICU admission and management.  Pertinent Medical History:   Past Medical History:  Diagnosis Date   Chronic diastolic CHF (congestive heart failure) (HCC) 04/11/2020   DVT, lower extremity, distal, acute, left (HCC) 01/14/2020   Essential hypertension 09/09/2018   Goals of  care, counseling/discussion 01/14/2020   History of radiation therapy    Abdomen (liver nodule, portacaval lymph node) 04/15/2020-04/26/2020  Dr Margaretmary Dys   History of radiation therapy    left hip, left ilium  07/16/2020-07/29/2020  Dr Margaretmary Dys   History of radiation therapy    T4-T6, left hip, left ilium  02/24/2021-03/07/2021  Dr Margaretmary Dys   History of radiation therapy    left glenoid fossa  05/29/2021-06/11/2021  Dr Antony Blackbird   Hypotestosteronemia in male 04/04/2021   Kidney stone    Primary cancer of thyroid with metastasis to other site [facility]) 05/04/2018   Rickettsia infection    Significant Hospital Events: Including procedures, antibiotic start and stop dates in addition to other pertinent events   6/14 - Presented to [facility] ED with AMS. PCCM consulted for ICU admission. R thora completed with 1.5L removed.  Interim History / Subjective:  PCCM consulted for ICU admission.  Objective:  Blood pressure 105/81, pulse (!) 108, temperature 97.7 F (36.5 C), temperature source Oral, resp. rate (!) 32, height 5\' 10"  (1.778 m), weight 108.9 kg, SpO2 99 %.        Intake/Output Summary (Last 24 hours) at 07/31/2022 1712 Last data filed at 07/31/2022 1419 Gross per 24 hour  Intake 100 ml  Output --  Net 100 ml   Filed Weights   07/31/22 1231  Weight: 108.9 kg   Physical Examination: General: Acutely ill-appearing middle-aged man, toxic-appearing. HEENT: [place]/AT, anicteric sclera, PERRL, moist mucous membranes. Neuro: Lethargic. Responds to verbal stimuli. Following commands intermittently. Moves all 4 extremities spontaneously. Profound generalized weakness. CV: Tachycardic, regular rhythm, no m/g/r.  PULM: Breathing tachypneic and mildly labored on 2LNC. Lung fields diminished on R. GI: Soft, mildly distended, generalized TTP most notable over RUQ. Hypoactive bowel sounds. Extremities: Bilateral symmetric 2+ LE edema noted. Skin: Warm/dry, +erythema noted to lower face  and neck.  Resolved Hospital Problem List:    Assessment & Plan:  Sepsis of unknown etiology - Admit to ICU for close monitoring - Goal MAP > 65 - Fluid resuscitation as tolerated - Not requiring pressors at present, if necessary begin Levophed titrated to goal MAP - Trend WBC, fever curve, LA; PCT 3.21 - F/u Cx data - Continue broad-spectrum antibiotics (cefepime, vanc, Flagyl)  Altered mental status, likely septic encephalopathy versus medication effect - Improved with fluid resuscitation - Continue to monitor closely - Correct metabolic derangements - Low threshold for Narcan use if necessary - Delirium precautions  R pleural effusion in the setting of lung metastasis/liver mass Acute hypoxemic respiratory failure Pulmonary edema CXR with pulmonary edema, moderate R pleural effusion. - Continue supplemental O2 support to maintain goal sat > 90% - BiPAP PRN, not requiring at present - Pulmonary hygiene - S/p R thoracentesis with 1.5L removed - F/U pleural fluid studies - Consider diuresis when more hemodynamically stable - Follow CXR  Nonsustained VT HFpEF Hypokalemia Hypomagnesemia Echo 6/14 with EF 55%, similar to prior. Reported ~9min NSVT while in ER. - S/p amiodarone bolus 150mg  x 1 - Cardiac monitoring - Optimize electrolytes for K > 4, Mg > 2 - Diuresis once clinically appropriate  Hypothyroidism, acquired - Continue levothyroxine  Thyroid CA with metastasis to liver, lung, bone Chronic pain in the setting of metastatic thyroid CA Followed by Dr. Myna Hidalgo. Per Oncology notes, on very aggressive, last line regimen. PET 6/7 showed progression of hepatic metastatic disease, thoracic nodal metastasis, new R pleural effusion with similar pulmonary metastasis, mild progression of osseous metastasis. ECOG 3 at last Onc visit. - Oncology made aware of patient's admission - Multimodal pain control - Not currently on chemo due to clinical status/inability to  tolerate - Ongoing GOC discussion with patient/family/Oncology team  History of bilateral PE History of LLE DVT - Hold Eliquis for now - Resume as appropriate - SCDs  History of HTN - Hold home antihypertensives for now  Best Practice: (right click and "Reselect all SmartList Selections" daily)   Diet/type: NPO DVT prophylaxis: SCDs, hold Eliquis for now GI prophylaxis: H2B Lines: Central line - Port-A-Cath Foley:  N/A Code Status:  full code Last date of multidisciplinary goals of care discussion [Pending]  Labs:  CBC: Recent Labs  Lab 07/31/22 1249  WBC 9.4  NEUTROABS 8.0*  HGB 9.3*  HCT 30.4*  MCV 93.0  PLT 161   Basic Metabolic Panel: Recent Labs  Lab 07/31/22 1249  NA 131*  K 3.2*  CL 90*  CO2 28  GLUCOSE 106*  BUN 29*  CREATININE 0.82  CALCIUM 8.2*  MG 1.6*   GFR: Estimated Creatinine Clearance: 139.2 mL/min (by C-G formula based on SCr of 0.82 mg/dL). Recent Labs  Lab 07/31/22 1249 07/31/22 1507  PROCALCITON  --  3.21  WBC 9.4  --   LATICACIDVEN 2.9* 2.4*   Liver Function Tests: Recent Labs  Lab 07/31/22 1249  AST 35  ALT 24  ALKPHOS 566*  BILITOT 2.4*  PROT 6.1*  ALBUMIN 2.6*   No results for input(s): "LIPASE", "AMYLASE" in the last 168 hours. Recent Labs  Lab 07/31/22 1507  AMMONIA 28   ABG:    Component Value Date/Time  PHART 7.53 (H) 07/31/2022 1326   PCO2ART 40 07/31/2022 1326   PO2ART 72 (L) 07/31/2022 1326   HCO3 33.5 (H) 07/31/2022 1326   O2SAT 96.8 07/31/2022 1326    Coagulation Profile: Recent Labs  Lab 07/31/22 1249  INR 2.2*   Cardiac Enzymes: No results for input(s): "CKTOTAL", "CKMB", "CKMBINDEX", "TROPONINI" in the last 168 hours.  HbA1C: No results found for: "HGBA1C"  CBG: Recent Labs  Lab 07/31/22 1549  GLUCAP 87   Review of Systems:   Review of systems completed with pertinent positives/negatives outlined in above HPI.  Past Medical History:  He,  has a past medical history of Chronic  diastolic CHF (congestive heart failure) (HCC) (04/11/2020), DVT, lower extremity, distal, acute, left (HCC) (01/14/2020), Essential hypertension (09/09/2018), Goals of care, counseling/discussion (01/14/2020), History of radiation therapy, History of radiation therapy, History of radiation therapy, History of radiation therapy, Hypotestosteronemia in male (04/04/2021), Kidney stone, Primary cancer of thyroid with metastasis to other site [facility]) (05/04/2018), and Rickettsia infection.   Surgical History:   Past Surgical History:  Procedure Laterality Date   BIOPSY  04/05/2018   Procedure: BIOPSY;  Surgeon: Bernette Redbird, MD;  Location: [facility] ENDOSCOPY;  Service: Endoscopy;;   CHOLECYSTECTOMY     ESOPHAGOGASTRODUODENOSCOPY (EGD) WITH PROPOFOL N/A 04/05/2018   Procedure: ESOPHAGOGASTRODUODENOSCOPY (EGD) WITH PROPOFOL;  Surgeon: Bernette Redbird, MD;  Location: [facility] ENDOSCOPY;  Service: Endoscopy;  Laterality: N/A;   IR IMAGING GUIDED PORT INSERTION  09/13/2019   IR RADIOLOGIST EVAL & MGMT  10/11/2018   Social History:   reports that he has never smoked. He has never used smokeless tobacco. He reports that he does not drink alcohol and does not use drugs.   Family History:  His family history includes Allergic rhinitis in his father; Diabetes in his mother; Heart attack in his mother; Heart disease in his mother; Heart failure in his mother; Hyperlipidemia in his father and mother; Hypertension in his mother; Rashes / Skin problems in his father. There is no history of Sudden death or Thyroid disease.   Allergies: Allergies  Allergen Reactions   Dextromethorphan-Guaifenesin Other (See Comments)    Irregular heartbeat    Doxycycline Anaphylaxis, Nausea And Vomiting, Rash and Other (See Comments)    "heart arrythmia" and "dyspepsia" (only oral doxycycline causes reaction)   Gadobutrol Hives and Other (See Comments)    Patient had MRI scan at [facility]. Patient called one hour after he left  imaging facility to report two "blisters" that came up on "back" of lip.   Gadolinium Derivatives Hives    Patient had MRI scan at [facility]. Patient called one hour after he left imaging facility to report two "blisters" that came up on "back" of lip.    Guaifenesin Palpitations        Ibuprofen Hives and Itching   Lisinopril Other (See Comments)    Angioedema   Pantoprazole Itching   Tramadol Hives and Itching   Nexavar [Sorafenib] Hives   Barium Rash    Developed redness around neck after drinking 1st bottle of Barocat; pt was given Benedryl by ED Mds to be "on the safe side" before drinking 2nd bottle   Chlorhexidine Hives   Home Medications: Prior to Admission medications   Medication Sig Start Date End Date Taking? Authorizing Provider  NONFORMULARY OR COMPOUNDED ITEM Use as directed 15 mLs in the mouth or throat See admin instructions. Magic Mouthwash W/Maalox- SWISH AND EXPECTORATE 15 ML (THREE TEASPOONSFUL) BY MOUTH EVERY 4 HOURS AS NEEDED  FOR MOUTH PAIN   Yes [provider]  apixaban (ELIQUIS) 5 MG TABS tablet TAKE 1 TABLET (5 MG TOTAL) BY MOUTH 2 (TWO) TIMES DAILY. 04/03/22 04/03/23  Josph Macho, MD  baclofen (LIORESAL) 10 MG tablet Take 1 tablet (10 mg total) by mouth 4 (four) times daily as needed for muscle spasms. 05/27/22   Josph Macho, MD  chlorproMAZINE (THORAZINE) 25 MG tablet Take 1 tablet (25 mg total) by mouth 4 (four) times daily as needed. 05/25/22   Josph Macho, MD  cyclobenzaprine (FLEXERIL) 10 MG tablet Take 1 tablet (10 mg total) by mouth 3 (three) times daily as needed for muscle spasms. 07/02/22   Josph Macho, MD  dexamethasone (DECADRON) 4 MG tablet Take 2 tablets (8 mg total) by mouth daily for 3 days starting the day after cisplatin chemotherapy. Take with food. 05/19/22   Josph Macho, MD  diphenhydrAMINE (BENADRYL) 25 mg capsule Take 2 capsules (50 mg total) by mouth once for 1 dose. 02/17/22 07/31/22  Josph Macho, MD   EPINEPHrine (EPIPEN 2-PAK) 0.3 mg/0.3 mL IJ SOAJ injection USE AS DIRECTED FOR LIFE THREATENING ALLERGIC REACTIONS 09/04/20   Josph Macho, MD  famotidine (PEPCID) 40 MG tablet Take 1 tablet (40 mg total) by mouth 2 (two) times daily. 09/22/21   Josph Macho, MD  HYDROmorphone (DILAUDID) 8 MG tablet Take 1 tablet (8 mg total) by mouth every 4 (four) hours as needed for severe pain. 07/21/22   Josph Macho, MD  Iron-FA-B Cmp-C-Biot-Probiotic (FUSION PLUS) CAPS Take 1 capsule by mouth daily. 09/22/21   Erenest Blank, NP  levothyroxine (SYNTHROID) 200 MCG tablet Take 1 tablet (200 mcg total) by mouth daily. Take with 88 mcg tablet for a total dose of 288 mcg/day. 07/02/22   Josph Macho, MD  LORazepam (ATIVAN) 1 MG tablet Take 1 tablet (1 mg total) by mouth every 6 (six) hours as needed for anxiety (for nausea and vomiting). 07/09/22   Josph Macho, MD  magic mouthwash SOLN Swish and spit 15 mls by mouth every four hours as needed for mouth pain. 06/01/22   Josph Macho, MD  methylphenidate (RITALIN) 10 MG tablet Take 1 tablet (10 mg total) by mouth 2 (two) times daily. 04/03/22   Josph Macho, MD  metoCLOPramide (REGLAN) 10 MG tablet Take 1 tablet (10 mg total) by mouth every 6 (six) hours as needed for nausea or vomiting. 03/09/22   Josph Macho, MD  metolazone (ZAROXOLYN) 5 MG tablet Take 5 mg by mouth daily. 12/22/18   [provider]  metoprolol tartrate (LOPRESSOR) 50 MG tablet Take 1 tablet (50 mg total) by mouth 2 (two) times daily. 04/03/22   Josph Macho, MD  morphine (MS CONTIN) 15 MG 12 hr tablet Take 1 tablet (15 mg total) by mouth every 12 (twelve) hours. 07/23/22   Josph Macho, MD  Multiple Vitamin (MULTIVITAMIN) capsule Take 1 capsule by mouth daily.    [provider]  OLANZapine (ZYPREXA) 10 MG tablet TAKE 1 TABLET (10 MG TOTAL) BY MOUTH AT BEDTIME. 11/04/21 11/04/22  Josph Macho, MD  ondansetron (ZOFRAN) 8 MG tablet Take 1 tablet (8 mg  total) by mouth every 8 (eight) hours as needed for nausea or vomiting. Start on the third day after cisplatin. 05/19/22   Josph Macho, MD  polyethylene glycol powder (GLYCOLAX/MIRALAX) 17 GM/SCOOP powder Mix 17 grams (1 Capful) in 8 ounces of liquid and  drink by mouth daily as needed. 07/22/21   Josph Macho, MD  potassium chloride SA (KLOR-CON M) 20 MEQ tablet Take 1 tablet (20 mEq total) by mouth daily. 11/06/21   Josph Macho, MD  prednisoLONE acetate (PRED FORTE) 1 % ophthalmic suspension  04/16/22   [provider]  predniSONE (DELTASONE) 50 MG tablet TAKE 1 TABLET BY MOUTH 13 HOURS BEFORE CT SCAN, 1 TABLET 7 HOURS BEFORE CT SCAN, AND 1 TABLET 1 HOUR BEFORE CT [facility] 02/17/22 02/17/23  Josph Macho, MD  prochlorperazine (COMPAZINE) 10 MG tablet Take 1 tablet (10 mg total) by mouth every 6 (six) hours as needed (Nausea or vomiting). 05/19/22   Josph Macho, MD  promethazine (PHENERGAN) 25 MG tablet Take 1 tablet (25 mg total) by mouth every 6 (six) hours as needed for nausea and vomiting. 05/18/22 05/18/23  Josph Macho, MD  senna-docusate (SENOKOT-S) 8.6-50 MG tablet Take 1 tablet by mouth at bedtime as needed for mild constipation. 04/15/20   Noralee Stain, DO  tamsulosin (FLOMAX) 0.4 MG CAPS capsule Take 1 capsule (0.4 mg total) by mouth daily after supper. 07/24/21   Josph Macho, MD  temazepam (RESTORIL) 30 MG capsule Take 1 capsule (30 mg total) by mouth at bedtime as needed for sleep. 07/02/22   Josph Macho, MD   Critical care time:   The patient is critically ill with multiple organ system failure and requires high complexity decision making for assessment and support, frequent evaluation and titration of therapies, advanced monitoring, review of radiographic studies and interpretation of complex data.   Critical Care Time devoted to patient care services, exclusive of separately billable procedures, described in this note is 39 minutes.  Tim Lair,  PA-C [facility] 07/31/22 5:12 PM  Please see [email or web address] for pager details.  From 7A-7P if no response, please call [phone] After hours, please call ELink [phone]

## 2022-07-31 NOTE — ED Notes (Addendum)
Dr. Lynelle Doctor states to proceed with fluid bolus. This RN will run fluids at 574mL/hr due to pt condition and swelling in pt legs.

## 2022-07-31 NOTE — ED Notes (Signed)
This RN asked about giving the amount of fluids ordered due to pt bilateral leg swelling with pitting edema and crackles in lungs. Awaiting response to see if he wants meds held

## 2022-07-31 NOTE — H&P (Signed)
NAME:  Brian [name], MRN:  [id number], DOB:  [birth year], LOS: 0 ADMISSION DATE:  07/31/2022 CONSULTATION DATE:  07/31/2022 REFERRING MD:  Lynelle Doctor - EDP CHIEF COMPLAINT:  Low grade fever, AMS   History of Present Illness:  47 year old man who presented to [facility] ED 6/14 for low grade fever, AMS. 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 Myna Hidalgo).  Per chart review/patient's father (at bedside), patient had been disoriented for a few days, worsening today with increased LE edema, SOB and incontinence. Noted fever at home ~100.78F. Has had some recent history of recurrent n/v and poor PO intake requiring IV fluid administration. Pain has been poorly controlled and patient has been much more fatigued than usual. Recent PET 6/7 showed progression of hepatic metastatic disease, thoracic nodal metastasis, new R pleural effusion with similar pulmonary metastasis, mild progression of osseous metastasis.  Presented to [facility] ED 6/14 on recommendation from Oncology. On arrival, patient was toxic-appearing but afebrile with HR 109, BP 95/69, RR 30, SpO2 100%. Labs were notable for WBC 9.4, Hgb 9.3, Plt 171, INR 2.2 (on Eliquis), Na 131, K 3.2, CO2 28, BUN/Cr 29/0.82, Mg 1.6. Trop 56, BNP 104. TSH 10.7. LA 2.9 > 2.4. UA unremarkable and BCx pending; PCT 3.21 and broad-spectrum antibiotics started. ABG with pH 7.53/pCO2 40/pO2 72/bicarb 33. CXR with pulmonary edema, moderate R pleural effusion. CT Head NAICA.  PCCM consulted for ICU admission and management.  Pertinent Medical History:   Past Medical History:  Diagnosis Date   Chronic diastolic CHF (congestive heart failure) (HCC) 04/11/2020   DVT, lower extremity, distal, acute, left (HCC) 01/14/2020   Essential hypertension 09/09/2018   Goals of  care, counseling/discussion 01/14/2020   History of radiation therapy    Abdomen (liver nodule, portacaval lymph node) 04/15/2020-04/26/2020  Dr Margaretmary Dys   History of radiation therapy    left hip, left ilium  07/16/2020-07/29/2020  Dr Margaretmary Dys   History of radiation therapy    T4-T6, left hip, left ilium  02/24/2021-03/07/2021  Dr Margaretmary Dys   History of radiation therapy    left glenoid fossa  05/29/2021-06/11/2021  Dr Antony Blackbird   Hypotestosteronemia in male 04/04/2021   Kidney stone    Primary cancer of thyroid with metastasis to other site [facility]) 05/04/2018   Rickettsia infection    Significant Hospital Events: Including procedures, antibiotic start and stop dates in addition to other pertinent events   6/14 - Presented to [facility] ED with AMS. PCCM consulted for ICU admission. R thora completed with 1.5L removed.  Interim History / Subjective:  PCCM consulted for ICU admission.  Objective:  Blood pressure 106/67, pulse (!) 108, temperature 97.7 F (36.5 C), temperature source Oral, resp. rate (!) 28, height 5\' 10"  (1.778 m), weight 108.9 kg, SpO2 97 %.        Intake/Output Summary (Last 24 hours) at 07/31/2022 1800 Last data filed at 07/31/2022 1747 Gross per 24 hour  Intake 1920.3 ml  Output --  Net 1920.3 ml    Filed Weights   07/31/22 1231  Weight: 108.9 kg   Physical Examination: General: Acutely ill-appearing middle-aged man, toxic-appearing. HEENT: [place]/AT, anicteric sclera, PERRL, moist mucous membranes. Neuro: Lethargic. Responds to verbal stimuli. Following commands intermittently. Moves all 4 extremities spontaneously. Profound generalized weakness. CV: Tachycardic, regular rhythm, no  m/g/r. PULM: Breathing tachypneic and mildly labored on 2LNC. Lung fields diminished on R. GI: Soft, mildly distended, generalized TTP most notable over RUQ. Hypoactive bowel sounds. Extremities: Bilateral symmetric 2+ LE edema noted. Skin: Warm/dry, +erythema noted to  lower face and neck.  Resolved Hospital Problem List:    Assessment & Plan:  Sepsis of unknown etiology - Admit to ICU for close monitoring - Goal MAP > 65 - Fluid resuscitation as tolerated - Not requiring pressors at present, if necessary begin Levophed titrated to goal MAP - Trend WBC, fever curve, LA; PCT 3.21 - F/u Cx data - Continue broad-spectrum antibiotics (cefepime, vanc, Flagyl)  Altered mental status, likely septic encephalopathy versus medication effect - Improved with fluid resuscitation - Continue to monitor closely - Correct metabolic derangements - Low threshold for Narcan use if necessary - Delirium precautions  R pleural effusion in the setting of lung metastasis/liver mass Acute hypoxemic respiratory failure Pulmonary edema CXR with pulmonary edema, moderate R pleural effusion. - Continue supplemental O2 support to maintain goal sat > 90% - BiPAP PRN, not requiring at present - Pulmonary hygiene - S/p R thoracentesis with 1.5L removed - F/U pleural fluid studies - Consider diuresis when more hemodynamically stable - Follow CXR  Nonsustained VT HFpEF Hypokalemia Hypomagnesemia Echo 6/14 with EF 55%, similar to prior. Reported ~46min NSVT while in ER. - S/p amiodarone bolus 150mg  x 1 - Cardiac monitoring - Optimize electrolytes for K > 4, Mg > 2 - Diuresis once clinically appropriate  Hypothyroidism, acquired - Continue levothyroxine  Thyroid CA with metastasis to liver, lung, bone Chronic pain in the setting of metastatic thyroid CA Followed by Dr. Myna Hidalgo. Per Oncology notes, on very aggressive, last line regimen. PET 6/7 showed progression of hepatic metastatic disease, thoracic nodal metastasis, new R pleural effusion with similar pulmonary metastasis, mild progression of osseous metastasis. ECOG 3 at last Onc visit. - Oncology made aware of patient's admission - Multimodal pain control - Not currently on chemo due to clinical status/inability  to tolerate - Ongoing GOC discussion with patient/family/Oncology team  History of bilateral PE History of LLE DVT - Hold Eliquis for now - Resume as appropriate - SCDs  History of HTN - Hold home antihypertensives for now  Best Practice: (right click and "Reselect all SmartList Selections" daily)   Diet/type: NPO DVT prophylaxis: SCDs, hold Eliquis for now GI prophylaxis: H2B Lines: Central line - Port-A-Cath Foley:  N/A Code Status:  full code Last date of multidisciplinary goals of care discussion [Pending]  Labs:  CBC: Recent Labs  Lab 07/31/22 1249  WBC 9.4  NEUTROABS 8.0*  HGB 9.3*  HCT 30.4*  MCV 93.0  PLT 161    Basic Metabolic Panel: Recent Labs  Lab 07/31/22 1249  NA 131*  K 3.2*  CL 90*  CO2 28  GLUCOSE 106*  BUN 29*  CREATININE 0.82  CALCIUM 8.2*  MG 1.6*    GFR: Estimated Creatinine Clearance: 139.2 mL/min (by C-G formula based on SCr of 0.82 mg/dL). Recent Labs  Lab 07/31/22 1249 07/31/22 1507  PROCALCITON  --  3.21  WBC 9.4  --   LATICACIDVEN 2.9* 2.4*    Liver Function Tests: Recent Labs  Lab 07/31/22 1249 07/31/22 1714  AST 35  --   ALT 24  --   ALKPHOS 566*  --   BILITOT 2.4*  --   PROT 6.1* 5.7*  ALBUMIN 2.6*  --     No results for input(s): "LIPASE", "AMYLASE" in the  last 168 hours. Recent Labs  Lab 07/31/22 1507  AMMONIA 28    ABG:    Component Value Date/Time   PHART 7.53 (H) 07/31/2022 1326   PCO2ART 40 07/31/2022 1326   PO2ART 72 (L) 07/31/2022 1326   HCO3 33.5 (H) 07/31/2022 1326   O2SAT 96.8 07/31/2022 1326    Coagulation Profile: Recent Labs  Lab 07/31/22 1249  INR 2.2*    Cardiac Enzymes: No results for input(s): "CKTOTAL", "CKMB", "CKMBINDEX", "TROPONINI" in the last 168 hours.  HbA1C: No results found for: "HGBA1C"  CBG: Recent Labs  Lab 07/31/22 1549 07/31/22 1754  GLUCAP 87 79    Review of Systems:   Review of systems completed with pertinent positives/negatives outlined in  above HPI.  Past Medical History:  He,  has a past medical history of Chronic diastolic CHF (congestive heart failure) (HCC) (04/11/2020), DVT, lower extremity, distal, acute, left (HCC) (01/14/2020), Essential hypertension (09/09/2018), Goals of care, counseling/discussion (01/14/2020), History of radiation therapy, History of radiation therapy, History of radiation therapy, History of radiation therapy, Hypotestosteronemia in male (04/04/2021), Kidney stone, Primary cancer of thyroid with metastasis to other site [facility]) (05/04/2018), and Rickettsia infection.   Surgical History:   Past Surgical History:  Procedure Laterality Date   BIOPSY  04/05/2018   Procedure: BIOPSY;  Surgeon: Bernette Redbird, MD;  Location: [facility] ENDOSCOPY;  Service: Endoscopy;;   CHOLECYSTECTOMY     ESOPHAGOGASTRODUODENOSCOPY (EGD) WITH PROPOFOL N/A 04/05/2018   Procedure: ESOPHAGOGASTRODUODENOSCOPY (EGD) WITH PROPOFOL;  Surgeon: Bernette Redbird, MD;  Location: [facility] ENDOSCOPY;  Service: Endoscopy;  Laterality: N/A;   IR IMAGING GUIDED PORT INSERTION  09/13/2019   IR RADIOLOGIST EVAL & MGMT  10/11/2018   Social History:   reports that he has never smoked. He has never used smokeless tobacco. He reports that he does not drink alcohol and does not use drugs.   Family History:  His family history includes Allergic rhinitis in his father; Diabetes in his mother; Heart attack in his mother; Heart disease in his mother; Heart failure in his mother; Hyperlipidemia in his father and mother; Hypertension in his mother; Rashes / Skin problems in his father. There is no history of Sudden death or Thyroid disease.   Allergies: Allergies  Allergen Reactions   Dextromethorphan-Guaifenesin Other (See Comments)    Irregular heartbeat    Doxycycline Anaphylaxis, Nausea And Vomiting, Rash and Other (See Comments)    "heart arrythmia" and "dyspepsia" (only oral doxycycline causes reaction)   Gadobutrol Hives and Other (See Comments)     Patient had MRI scan at [facility]. Patient called one hour after he left imaging facility to report two "blisters" that came up on "back" of lip.   Gadolinium Derivatives Hives    Patient had MRI scan at [facility]. Patient called one hour after he left imaging facility to report two "blisters" that came up on "back" of lip.    Guaifenesin Palpitations        Ibuprofen Hives and Itching   Lisinopril Other (See Comments)    Angioedema   Pantoprazole Itching   Tramadol Hives and Itching   Nexavar [Sorafenib] Hives   Barium Rash    Developed redness around neck after drinking 1st bottle of Barocat; pt was given Benedryl by ED Mds to be "on the safe side" before drinking 2nd bottle   Chlorhexidine Hives   Home Medications: Prior to Admission medications   Medication Sig Start Date End Date Taking? Authorizing Provider  NONFORMULARY OR COMPOUNDED ITEM Use  as directed 15 mLs in the mouth or throat See admin instructions. Magic Mouthwash W/Maalox- SWISH AND EXPECTORATE 15 ML (THREE TEASPOONSFUL) BY MOUTH EVERY 4 HOURS AS NEEDED FOR MOUTH PAIN   Yes [provider]  apixaban (ELIQUIS) 5 MG TABS tablet TAKE 1 TABLET (5 MG TOTAL) BY MOUTH 2 (TWO) TIMES DAILY. 04/03/22 04/03/23  Josph Macho, MD  baclofen (LIORESAL) 10 MG tablet Take 1 tablet (10 mg total) by mouth 4 (four) times daily as needed for muscle spasms. 05/27/22   Josph Macho, MD  chlorproMAZINE (THORAZINE) 25 MG tablet Take 1 tablet (25 mg total) by mouth 4 (four) times daily as needed. 05/25/22   Josph Macho, MD  cyclobenzaprine (FLEXERIL) 10 MG tablet Take 1 tablet (10 mg total) by mouth 3 (three) times daily as needed for muscle spasms. 07/02/22   Josph Macho, MD  dexamethasone (DECADRON) 4 MG tablet Take 2 tablets (8 mg total) by mouth daily for 3 days starting the day after cisplatin chemotherapy. Take with food. 05/19/22   Josph Macho, MD  diphenhydrAMINE (BENADRYL) 25 mg capsule Take 2 capsules  (50 mg total) by mouth once for 1 dose. 02/17/22 07/31/22  Josph Macho, MD  EPINEPHrine (EPIPEN 2-PAK) 0.3 mg/0.3 mL IJ SOAJ injection USE AS DIRECTED FOR LIFE THREATENING ALLERGIC REACTIONS 09/04/20   Josph Macho, MD  famotidine (PEPCID) 40 MG tablet Take 1 tablet (40 mg total) by mouth 2 (two) times daily. 09/22/21   Josph Macho, MD  HYDROmorphone (DILAUDID) 8 MG tablet Take 1 tablet (8 mg total) by mouth every 4 (four) hours as needed for severe pain. 07/21/22   Josph Macho, MD  Iron-FA-B Cmp-C-Biot-Probiotic (FUSION PLUS) CAPS Take 1 capsule by mouth daily. 09/22/21   Erenest Blank, NP  levothyroxine (SYNTHROID) 200 MCG tablet Take 1 tablet (200 mcg total) by mouth daily. Take with 88 mcg tablet for a total dose of 288 mcg/day. 07/02/22   Josph Macho, MD  LORazepam (ATIVAN) 1 MG tablet Take 1 tablet (1 mg total) by mouth every 6 (six) hours as needed for anxiety (for nausea and vomiting). 07/09/22   Josph Macho, MD  magic mouthwash SOLN Swish and spit 15 mls by mouth every four hours as needed for mouth pain. 06/01/22   Josph Macho, MD  methylphenidate (RITALIN) 10 MG tablet Take 1 tablet (10 mg total) by mouth 2 (two) times daily. 04/03/22   Josph Macho, MD  metoCLOPramide (REGLAN) 10 MG tablet Take 1 tablet (10 mg total) by mouth every 6 (six) hours as needed for nausea or vomiting. 03/09/22   Josph Macho, MD  metolazone (ZAROXOLYN) 5 MG tablet Take 5 mg by mouth daily. 12/22/18   [provider]  metoprolol tartrate (LOPRESSOR) 50 MG tablet Take 1 tablet (50 mg total) by mouth 2 (two) times daily. 04/03/22   Josph Macho, MD  morphine (MS CONTIN) 15 MG 12 hr tablet Take 1 tablet (15 mg total) by mouth every 12 (twelve) hours. 07/23/22   Josph Macho, MD  Multiple Vitamin (MULTIVITAMIN) capsule Take 1 capsule by mouth daily.    [provider]  OLANZapine (ZYPREXA) 10 MG tablet TAKE 1 TABLET (10 MG TOTAL) BY MOUTH AT BEDTIME. 11/04/21 11/04/22   Josph Macho, MD  ondansetron (ZOFRAN) 8 MG tablet Take 1 tablet (8 mg total) by mouth every 8 (eight) hours as needed for nausea or vomiting. Start on the third  day after cisplatin. 05/19/22   Josph Macho, MD  polyethylene glycol powder (GLYCOLAX/MIRALAX) 17 GM/SCOOP powder Mix 17 grams (1 Capful) in 8 ounces of liquid and drink by mouth daily as needed. 07/22/21   Josph Macho, MD  potassium chloride SA (KLOR-CON M) 20 MEQ tablet Take 1 tablet (20 mEq total) by mouth daily. 11/06/21   Josph Macho, MD  prednisoLONE acetate (PRED FORTE) 1 % ophthalmic suspension  04/16/22   [provider]  predniSONE (DELTASONE) 50 MG tablet TAKE 1 TABLET BY MOUTH 13 HOURS BEFORE CT SCAN, 1 TABLET 7 HOURS BEFORE CT SCAN, AND 1 TABLET 1 HOUR BEFORE CT [facility] 02/17/22 02/17/23  Josph Macho, MD  prochlorperazine (COMPAZINE) 10 MG tablet Take 1 tablet (10 mg total) by mouth every 6 (six) hours as needed (Nausea or vomiting). 05/19/22   Josph Macho, MD  promethazine (PHENERGAN) 25 MG tablet Take 1 tablet (25 mg total) by mouth every 6 (six) hours as needed for nausea and vomiting. 05/18/22 05/18/23  Josph Macho, MD  senna-docusate (SENOKOT-S) 8.6-50 MG tablet Take 1 tablet by mouth at bedtime as needed for mild constipation. 04/15/20   Noralee Stain, DO  tamsulosin (FLOMAX) 0.4 MG CAPS capsule Take 1 capsule (0.4 mg total) by mouth daily after supper. 07/24/21   Josph Macho, MD  temazepam (RESTORIL) 30 MG capsule Take 1 capsule (30 mg total) by mouth at bedtime as needed for sleep. 07/02/22   Josph Macho, MD   Critical care time:   The patient is critically ill with multiple organ system failure and requires high complexity decision making for assessment and support, frequent evaluation and titration of therapies, advanced monitoring, review of radiographic studies and interpretation of complex data.   Critical Care Time devoted to patient care services, exclusive of separately billable  procedures, described in this note is 39 minutes.  Tim Lair, PA-C [facility] 07/31/22 6:00 PM  Please see [email or web address] for pager details.  From 7A-7P if no response, please call [phone] After hours, please call ELink [phone]

## 2022-07-31 NOTE — Progress Notes (Signed)
A consult was received from an ED physician for Vancomycin and Cefepime per pharmacy dosing.  The patient's profile has been reviewed for ht/wt/allergies/indication/available labs.    A one time order has been placed for: - Vancomycin 2gm IV x 1 dose. - Cefepime 2gm IV x 1 dose.  Further antibiotics/pharmacy consults should be ordered by admitting physician if indicated.                       Thank you, Jakeria Caissie Tylene Fantasia 07/31/2022  1:20 PM

## 2022-08-01 ENCOUNTER — Inpatient Hospital Stay (HOSPITAL_COMMUNITY): Payer: BC Managed Care – PPO

## 2022-08-01 DIAGNOSIS — J90 Pleural effusion, not elsewhere classified: Secondary | ICD-10-CM | POA: Diagnosis not present

## 2022-08-01 DIAGNOSIS — R531 Weakness: Secondary | ICD-10-CM

## 2022-08-01 DIAGNOSIS — G893 Neoplasm related pain (acute) (chronic): Secondary | ICD-10-CM | POA: Diagnosis not present

## 2022-08-01 DIAGNOSIS — Z7189 Other specified counseling: Secondary | ICD-10-CM

## 2022-08-01 DIAGNOSIS — C73 Malignant neoplasm of thyroid gland: Secondary | ICD-10-CM | POA: Diagnosis not present

## 2022-08-01 DIAGNOSIS — Z515 Encounter for palliative care: Secondary | ICD-10-CM

## 2022-08-01 LAB — GLUCOSE, CAPILLARY
Glucose-Capillary: 89 mg/dL (ref 70–99)
Glucose-Capillary: 88 mg/dL (ref 70–99)
Glucose-Capillary: 130 mg/dL — ABNORMAL HIGH (ref 70–99)
Glucose-Capillary: 114 mg/dL — ABNORMAL HIGH (ref 70–99)
Glucose-Capillary: 117 mg/dL — ABNORMAL HIGH (ref 70–99)

## 2022-08-01 LAB — CBC
HCT: 29.9 % — ABNORMAL LOW (ref 39.0–52.0)
Hemoglobin: 9.3 g/dL — ABNORMAL LOW (ref 13.0–17.0)
MCH: 29 pg (ref 26.0–34.0)
MCHC: 31.1 g/dL (ref 30.0–36.0)
MCV: 93.1 fL (ref 80.0–100.0)
Platelets: 168 10*3/uL (ref 150–400)
RBC: 3.21 MIL/uL — ABNORMAL LOW (ref 4.22–5.81)
RDW: 19.6 % — ABNORMAL HIGH (ref 11.5–15.5)
WBC: 8.5 10*3/uL (ref 4.0–10.5)
nRBC: 0 % (ref 0.0–0.2)

## 2022-08-01 LAB — COMPREHENSIVE METABOLIC PANEL
ALT: 21 U/L (ref 0–44)
AST: 38 U/L (ref 15–41)
Albumin: 2.1 g/dL — ABNORMAL LOW (ref 3.5–5.0)
Alkaline Phosphatase: 517 U/L — ABNORMAL HIGH (ref 38–126)
Anion gap: 14 (ref 5–15)
BUN: 27 mg/dL — ABNORMAL HIGH (ref 6–20)
CO2: 26 mmol/L (ref 22–32)
Calcium: 7.8 mg/dL — ABNORMAL LOW (ref 8.9–10.3)
Chloride: 95 mmol/L — ABNORMAL LOW (ref 98–111)
Creatinine, Ser: 0.82 mg/dL (ref 0.61–1.24)
GFR, Estimated: >60 mL/min (ref >60)
Glucose, Bld: 107 mg/dL — ABNORMAL HIGH (ref 70–99)
Potassium: 3.3 mmol/L — ABNORMAL LOW (ref 3.5–5.1)
Sodium: 135 mmol/L (ref 135–145)
Total Bilirubin: 2.6 mg/dL — ABNORMAL HIGH (ref 0.3–1.2)
Total Protein: 5.6 g/dL — ABNORMAL LOW (ref 6.5–8.1)

## 2022-08-01 LAB — LACTATE DEHYDROGENASE, PLEURAL OR PERITONEAL FLUID: LD, Fluid: 612 U/L — ABNORMAL HIGH (ref 3–23)

## 2022-08-01 LAB — GLUCOSE, PLEURAL OR PERITONEAL FLUID: Glucose, Fluid: 97 mg/dL

## 2022-08-01 LAB — CULTURE, BLOOD (ROUTINE X 2): Special Requests: ADEQUATE

## 2022-08-01 LAB — MAGNESIUM: Magnesium: 1.7 mg/dL (ref 1.7–2.4)

## 2022-08-01 LAB — PHOSPHORUS: Phosphorus: 3.9 mg/dL (ref 2.5–4.6)

## 2022-08-01 LAB — PROTEIN, PLEURAL OR PERITONEAL FLUID: Total protein, fluid: 3.9 g/dL

## 2022-08-01 MED ORDER — SODIUM CHLORIDE 0.9% FLUSH: 9.0000 mL | INTRAVENOUS | Status: DC | PRN

## 2022-08-01 MED ORDER — MAGNESIUM SULFATE 2 GM/50ML IV SOLN
2.0000 g | Freq: Once | INTRAVENOUS | Status: AC
  Administered 2022-08-01: 2 g via INTRAVENOUS
  Filled 2022-08-01: qty 50

## 2022-08-01 MED ORDER — HYDROMORPHONE 1 MG/ML IV SOLN: INTRAVENOUS | Status: DC

## 2022-08-01 MED ORDER — METOPROLOL TARTRATE 25 MG PO TABS
50.0000 mg | ORAL_TABLET | Freq: Two times a day (BID) | ORAL | Status: DC
  Administered 2022-08-01 – 2022-08-03 (×5): 50 mg via ORAL
  Filled 2022-08-01 (×5): qty 2

## 2022-08-01 MED ORDER — NALOXONE HCL 0.4 MG/ML IJ SOLN: 0.4000 mg | INTRAMUSCULAR | Status: DC | PRN

## 2022-08-01 MED ORDER — BACLOFEN 10 MG PO TABS: 10.0000 mg | ORAL_TABLET | Freq: Four times a day (QID) | ORAL | Status: DC | PRN

## 2022-08-01 MED ORDER — TEMAZEPAM 15 MG PO CAPS: 30.0000 mg | ORAL_CAPSULE | Freq: Every evening | ORAL | Status: DC | PRN

## 2022-08-01 MED ORDER — DIPHENHYDRAMINE HCL 50 MG/ML IJ SOLN: 12.5000 mg | Freq: Four times a day (QID) | INTRAMUSCULAR | Status: DC | PRN

## 2022-08-01 MED ORDER — DIPHENHYDRAMINE HCL 12.5 MG/5ML PO ELIX: 12.5000 mg | ORAL_SOLUTION | Freq: Four times a day (QID) | ORAL | Status: DC | PRN

## 2022-08-01 MED ORDER — ONDANSETRON HCL 4 MG/2ML IJ SOLN: 4.0000 mg | Freq: Four times a day (QID) | INTRAMUSCULAR | Status: DC | PRN

## 2022-08-01 MED ORDER — CHLORPROMAZINE HCL 25 MG PO TABS: 25.0000 mg | ORAL_TABLET | Freq: Four times a day (QID) | ORAL | Status: DC | PRN

## 2022-08-01 MED ORDER — POTASSIUM CHLORIDE 10 MEQ/50ML IV SOLN
10.0000 meq | INTRAVENOUS | Status: AC
  Administered 2022-08-01 (×6): 10 meq via INTRAVENOUS
  Filled 2022-08-01 (×6): qty 50

## 2022-08-01 MED ORDER — CYCLOBENZAPRINE HCL 10 MG PO TABS
10.0000 mg | ORAL_TABLET | Freq: Three times a day (TID) | ORAL | Status: DC | PRN
  Administered 2022-08-05: 10 mg via ORAL
  Filled 2022-08-01: qty 1

## 2022-08-01 MED ORDER — METOPROLOL TARTRATE 25 MG PO TABS: 50.0000 mg | ORAL_TABLET | Freq: Two times a day (BID) | ORAL | Status: DC

## 2022-08-01 MED ORDER — SENNOSIDES-DOCUSATE SODIUM 8.6-50 MG PO TABS: 1.0000 | ORAL_TABLET | Freq: Every evening | ORAL | Status: DC | PRN

## 2022-08-01 MED ORDER — HYDROMORPHONE 1 MG/ML IV SOLN
INTRAVENOUS | Status: DC
  Administered 2022-08-02: 30 mg via INTRAVENOUS
  Administered 2022-08-02: 5.34 mg via INTRAVENOUS
  Administered 2022-08-02: 6 mg via INTRAVENOUS
  Administered 2022-08-02: 30 mg via INTRAVENOUS
  Administered 2022-08-03: 1.61 mg via INTRAVENOUS
  Administered 2022-08-03: 4.56 mg via INTRAVENOUS
  Administered 2022-08-03: 30 mg via INTRAVENOUS
  Administered 2022-08-03: 7.5 mg via INTRAVENOUS
  Administered 2022-08-03: 5.76 mg via INTRAVENOUS
  Administered 2022-08-03: 9.22 mg via INTRAVENOUS
  Administered 2022-08-04 (×2): 30 mg via INTRAVENOUS
  Administered 2022-08-04: 8.83 mg via INTRAVENOUS
  Administered 2022-08-05: 30 mg via INTRAVENOUS
  Filled 2022-08-01 (×6): qty 30

## 2022-08-01 MED ORDER — INSULIN ASPART 100 UNIT/ML IJ SOLN: 0.0000 [IU] | Freq: Every day | INTRAMUSCULAR | Status: DC

## 2022-08-01 MED ORDER — INSULIN ASPART 100 UNIT/ML IJ SOLN
0.0000 [IU] | Freq: Three times a day (TID) | INTRAMUSCULAR | Status: DC
  Administered 2022-08-01: 2 [IU] via SUBCUTANEOUS

## 2022-08-01 MED ORDER — HYDROMORPHONE 1 MG/ML IV SOLN
INTRAVENOUS | Status: DC
  Administered 2022-08-01: 30 mg via INTRAVENOUS
  Filled 2022-08-01: qty 30

## 2022-08-01 MED ORDER — HYDROMORPHONE HCL 1 MG/ML IJ SOLN
0.5000 mg | INTRAMUSCULAR | Status: DC | PRN
  Administered 2022-08-01 (×3): 1 mg via INTRAVENOUS
  Filled 2022-08-01 (×3): qty 1

## 2022-08-01 NOTE — Consult Note (Signed)
Consultation Note Date: 08/01/2022   Patient Name: Brian [name]  DOB: 06/15/1975  MRN: [id number]  Age / Sex: 47 y.o., male  PCP: Koren Bound, MD Referring Physician: Coralyn Helling, MD  Reason for Consultation: Establishing goals of care, Hospice Evaluation, Non pain symptom management, Pain control, and Psychosocial/spiritual support  HPI/Patient Profile: 47 y.o. male admitted on 07/31/2022    Clinical Assessment and Goals of Care: 47 year old gentleman with metastatic thyroid cancer, currently admitted to critical care service with medical oncology following.  Patient has been admitted with cough low-grade temperature altered mental status.  He has underlying history of heart failure with preserved ejection fraction DVT PE hypertension nephrolithiasis. He has been seen and evaluated by medical oncology Dr. Myna Hidalgo. Has been started on Dilaudid PCA for additional pain relief. Has a right exudative pleural effusion likely malignant in etiology-Pleurx catheter placement being considered. Hospital course also complicated by ventricular tachycardia acute pulmonary edema.  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  Palliative medicine is specialized medical care for people living with serious illness. It focuses on providing relief from the symptoms and stress of a serious illness. The goal is to improve quality of life for both the patient and the family. Goals of care: Broad aims of medical therapy in relation to the patient's values and preferences. Our aim is to provide medical care aimed at enabling patients to achieve the goals that matter most to them, given the circumstances of their particular medical situation and their constraints.    NEXT OF KIN Lives at home with his wife in [place], [place] has a 72 year old daughter and a 51 year old son.  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  SUMMARY OF RECOMMENDATIONS   Goals of care discussions: Discussed with patient and spouse.  Patient's brother-in-law also present.  Patient's young son and patient's father was also present at bedside at the time of my visit.  Offered active listening, supportive empathic presence and other therapeutic techniques at the time of this initial encounter.  Discussed in detail about current pain management.  Agree with Dilaudid PCA-we will adjust bolus rate and add low-dose basal rate and continue to monitor.  Appreciate pharmacy support.  Will request TOC consultation to explore option of home with hospice. PMT will continue to follow and support.  Thank you for the consult.  Code Status/Advance Care Planning: DNR   Symptom Management:     Palliative Prophylaxis:  Frequent Pain Assessment   Psycho-social/Spiritual:  Desire for further Chaplaincy support:yes Additional Recommendations: Education on Hospice  Prognosis:  ?few weeks.   Discharge Planning: Home with Hospice      Primary Diagnoses: Present on Admission:  AMS (altered mental status)   I have reviewed the medical record, interviewed the patient and family, and examined the patient. The following aspects are pertinent.  Past Medical History:  Diagnosis Date   Chronic diastolic CHF (congestive heart  failure) (HCC) 04/11/2020   DVT, lower extremity, distal, acute, left (HCC) 01/14/2020   Essential hypertension 09/09/2018   Goals of care, counseling/discussion 01/14/2020   History of radiation therapy    Abdomen (liver nodule, portacaval lymph node) 04/15/2020-04/26/2020  Dr Margaretmary Dys   History of radiation therapy    left hip, left ilium  07/16/2020-07/29/2020  Dr Margaretmary Dys   History of radiation therapy    T4-T6, left hip, left ilium  02/24/2021-03/07/2021  Dr Margaretmary Dys   History of  radiation therapy    left glenoid fossa  05/29/2021-06/11/2021  Dr Antony Blackbird   Hypotestosteronemia in male 04/04/2021   Kidney stone    Primary cancer of thyroid with metastasis to other site [facility]) 05/04/2018   Rickettsia infection    Social History   Socioeconomic History   Marital status: Married    Spouse name: Not on file   Number of children: Not on file   Years of education: Not on file   Highest education level: Not on file  Occupational History   Not on file  Tobacco Use   Smoking status: Never   Smokeless tobacco: Never  Vaping Use   Vaping Use: Never used  Substance and Sexual Activity   Alcohol use: No   Drug use: No   Sexual activity: Not Currently  Other Topics Concern   Not on file  Social History Narrative   Not on file   Social Determinants of Health   Financial Resource Strain: Not on file  Food Insecurity: No Food Insecurity (08/01/2022)   Hunger Vital Sign    Worried About Running Out of Food in the Last Year: Never true    Ran Out of Food in the Last Year: Never true  Transportation Needs: No Transportation Needs (08/01/2022)   PRAPARE - Administrator, Civil Service (Medical): No    Lack of Transportation (Non-Medical): No  Physical Activity: Not on file  Stress: Not on file  Social Connections: Not on file   Family History  Problem Relation Age of Onset   Diabetes Mother    Heart disease Mother    Heart failure Mother    Heart attack Mother    Hyperlipidemia Mother    Hypertension Mother    Hyperlipidemia Father    Allergic rhinitis Father    Rashes / Skin problems Father    Sudden death Neg Hx    Thyroid disease Neg Hx    Scheduled Meds:  HYDROmorphone   Intravenous Q4H   insulin aspart  0-15 Units Subcutaneous TID WC   insulin aspart  0-5 Units Subcutaneous QHS   levothyroxine  200 mcg Oral Daily   metoprolol tartrate  50 mg Oral BID   scopolamine  1 patch Transdermal Q72H   Continuous Infusions:  ceFEPime  (MAXIPIME) IV Stopped (08/01/22 0534)   dextrose 5 % and 0.9 % NaCl 30 mL/hr at 08/01/22 0911   potassium chloride 10 mEq (08/01/22 1153)   PRN Meds:.baclofen, chlorproMAZINE, cyclobenzaprine, diphenhydrAMINE **OR** diphenhydrAMINE, docusate sodium, naloxone **AND** sodium chloride flush, ondansetron (ZOFRAN) IV, mouth rinse, polyethylene glycol, prochlorperazine, senna-docusate, temazepam Medications Prior to Admission:  Prior to Admission medications   Medication Sig Start Date End Date Taking? Authorizing Provider  apixaban (ELIQUIS) 5 MG TABS tablet TAKE 1 TABLET (5 MG TOTAL) BY MOUTH 2 (TWO) TIMES DAILY. Patient taking differently: Take 5 mg by mouth 2 (two) times daily. 04/03/22 04/03/23 Yes Josph Macho, MD  baclofen (LIORESAL) 10 MG tablet Take  1 tablet (10 mg total) by mouth 4 (four) times daily as needed for muscle spasms. Patient taking differently: Take 10 mg by mouth 4 (four) times daily as needed for muscle spasms (OR HICCUPS). 05/27/22  Yes Ennever, Rose Phi, MD  chlorproMAZINE (THORAZINE) 25 MG tablet Take 1 tablet (25 mg total) by mouth 4 (four) times daily as needed. Patient taking differently: Take 25 mg by mouth 4 (four) times daily as needed for hiccoughs or nausea. 05/25/22  Yes Ennever, Rose Phi, MD  cyclobenzaprine (FLEXERIL) 10 MG tablet Take 1 tablet (10 mg total) by mouth 3 (three) times daily as needed for muscle spasms. Patient taking differently: Take 10 mg by mouth 3 (three) times daily as needed for muscle spasms (OR HICCUPS). 07/02/22  Yes Ennever, Rose Phi, MD  EPINEPHrine (EPIPEN 2-PAK) 0.3 mg/0.3 mL IJ SOAJ injection USE AS DIRECTED FOR LIFE THREATENING ALLERGIC REACTIONS Patient taking differently: See admin instructions. USE AS DIRECTED FOR LIFE THREATENING ALLERGIC REACTIONS 09/04/20  Yes Josph Macho, MD  famotidine (PEPCID) 40 MG tablet Take 1 tablet (40 mg total) by mouth 2 (two) times daily. Patient taking differently: Take 40 mg by mouth 2 (two) times  daily as needed for heartburn or indigestion. 09/22/21  Yes Josph Macho, MD  HYDROmorphone (DILAUDID) 8 MG tablet Take 1 tablet (8 mg total) by mouth every 4 (four) hours as needed for severe pain. 07/21/22  Yes Josph Macho, MD  Iron-FA-B Cmp-C-Biot-Probiotic (FUSION PLUS) CAPS Take 1 capsule by mouth daily. Patient taking differently: Take 1 capsule by mouth See admin instructions. Take 1 capsule by mouth once a day with food, IF TOLERATED 09/22/21  Yes Erenest Blank, NP  levothyroxine (SYNTHROID) 200 MCG tablet Take 1 tablet (200 mcg total) by mouth daily. Take with 88 mcg tablet for a total dose of 288 mcg/day. Patient taking differently: Take 200 mcg by mouth in the morning. 07/02/22  Yes Ennever, Rose Phi, MD  LORazepam (ATIVAN) 1 MG tablet Take 1 tablet (1 mg total) by mouth every 6 (six) hours as needed for anxiety (for nausea and vomiting). 07/09/22  Yes Josph Macho, MD  methylphenidate (RITALIN) 10 MG tablet Take 1 tablet (10 mg total) by mouth 2 (two) times daily. Patient taking differently: Take 10 mg by mouth in the morning. 04/03/22  Yes Ennever, Rose Phi, MD  metoCLOPramide (REGLAN) 10 MG tablet Take 1 tablet (10 mg total) by mouth every 6 (six) hours as needed for nausea or vomiting. 03/09/22  Yes Ennever, Rose Phi, MD  metoprolol tartrate (LOPRESSOR) 50 MG tablet Take 1 tablet (50 mg total) by mouth 2 (two) times daily. 04/03/22  Yes Josph Macho, MD  morphine (MS CONTIN) 15 MG 12 hr tablet Take 1 tablet (15 mg total) by mouth every 12 (twelve) hours. 07/23/22  Yes Josph Macho, MD  NONFORMULARY OR COMPOUNDED ITEM Use as directed 15 mLs in the mouth or throat See admin instructions. Magic Mouthwash W/Maalox- SWISH AND EXPECTORATE 15 ML (THREE TEASPOONSFUL) BY MOUTH EVERY 4 HOURS AS NEEDED FOR MOUTH PAIN   Yes [provider]  polyethylene glycol powder (GLYCOLAX/MIRALAX) 17 GM/SCOOP powder Mix 17 grams (1 Capful) in 8 ounces of liquid and drink by mouth daily as  needed. Patient taking differently: Take 17 g by mouth daily as needed for mild constipation. 07/22/21  Yes Ennever, Rose Phi, MD  potassium chloride SA (KLOR-CON M) 20 MEQ tablet Take 1 tablet (20 mEq total) by mouth daily. Patient  taking differently: Take 10 mEq by mouth daily. 11/06/21  Yes Ennever, Rose Phi, MD  prochlorperazine (COMPAZINE) 10 MG tablet Take 1 tablet (10 mg total) by mouth every 6 (six) hours as needed (Nausea or vomiting). 05/19/22  Yes Josph Macho, MD  promethazine (PHENERGAN) 25 MG tablet Take 1 tablet (25 mg total) by mouth every 6 (six) hours as needed for nausea and vomiting. 05/18/22 05/18/23 Yes Ennever, Rose Phi, MD  senna-docusate (SENOKOT-S) 8.6-50 MG tablet Take 1 tablet by mouth at bedtime as needed for mild constipation. 04/15/20  Yes Noralee Stain, DO  temazepam (RESTORIL) 30 MG capsule Take 1 capsule (30 mg total) by mouth at bedtime as needed for sleep. Patient taking differently: Take 30 mg by mouth at bedtime as needed for sleep (and hold for sedation). 07/02/22  Yes Josph Macho, MD  diphenhydrAMINE (BENADRYL) 25 mg capsule Take 2 capsules (50 mg total) by mouth once for 1 dose. Patient not taking: Reported on 07/31/2022 02/17/22 07/31/22  Josph Macho, MD  metolazone (ZAROXOLYN) 5 MG tablet Take 5 mg by mouth daily. 12/22/18   [provider]  Multiple Vitamin (MULTIVITAMIN) capsule Take 1 capsule by mouth daily.    [provider]   Allergies  Allergen Reactions   Dextromethorphan-Guaifenesin Other (See Comments)    Irregular heartbeat    Doxycycline Anaphylaxis, Nausea And Vomiting, Rash and Other (See Comments)    "heart arrythmia" and "dyspepsia" (only oral doxycycline causes reaction)   Gadobutrol Hives and Other (See Comments)    Patient had MRI scan at [facility]. Patient called one hour after he left imaging facility to report two "blisters" that came up on "back" of lip.   Gadolinium Derivatives Hives    Patient had MRI  scan at [facility]. Patient called one hour after he left imaging facility to report two "blisters" that came up on "back" of lip.    Guaifenesin Palpitations        Ibuprofen Hives and Itching   Lisinopril Other (See Comments)    Angioedema   Pantoprazole Itching   Tramadol Hives and Itching   Nexavar [Sorafenib] Hives   Tape Other (See Comments)    If left on for days, this skin gets irritated   Barium Rash    Developed redness around neck after drinking 1st bottle of Barocat; pt was given Benedryl by ED Mds to be "on the safe side" before drinking 2nd bottle   Chlorhexidine Hives   Review of Systems Generalized pain.   Physical Exam Awake alert resting in bed On PCA Interactive Complains of generalized pain Follows commands Regular work of breathing Monitor noted tachycardic Has some edema  Vital Signs: BP 117/73   Pulse (!) 109   Temp 97.8 F (36.6 C) (Oral)   Resp (!) 35   Ht 5\' 10"  (1.778 m)   Wt 106.5 kg   SpO2 98%   BMI 33.69 kg/m  Pain Scale: 0-10 POSS *See Group Information*: 1-Acceptable,Awake and alert Pain Score: 9    SpO2: SpO2: 98 % O2 Device:SpO2: 98 % O2 Flow Rate: .O2 Flow Rate (L/min): 2 L/min  IO: Intake/output summary:  Intake/Output Summary (Last 24 hours) at 08/01/2022 1211 Last data filed at 08/01/2022 0645 Gross per 24 hour  Intake 2869.58 ml  Output 1020 ml  Net 1849.58 ml    LBM:   Baseline Weight: Weight: 108.9 kg Most recent weight: Weight: 106.5 kg     Palliative Assessment/Data:   PPS 50%  Time In:  11.20 Time Out:  12.20 Time Total:  60  Greater than 50%  of this time was spent counseling and coordinating care related to the above assessment and plan.  Signed by: Rosalin Hawking, MD   Please contact Palliative Medicine Team phone at [phone] for questions and concerns.  For individual provider: See Loretha Stapler

## 2022-08-01 NOTE — Progress Notes (Signed)
NAME:  Brian [name], MRN:  [id number], DOB:  21-Dec-1975, LOS: 1 ADMISSION DATE:  07/31/2022 CONSULTATION DATE:  07/31/2022 REFERRING MD:  Lynelle Doctor - EDP CHIEF COMPLAINT:  Low grade fever, AMS   History of Present Illness:  47 yo with metastatic thyroid cancer with progression had cough, low grade temperature an change in mental status with increased lethargy. Was recently started on new pain medication. Recent PET scan and MRI liver which showed progression of cancer and new right pleural effusion. He has more leg swelling bilaterally.   Pertinent Medical History:  HFpEF, DVT, PE, HTN, Low testosterone, Nephrolithiasis  Significant Hospital Events: Including procedures, antibiotic start and stop dates in addition to other pertinent events   6/14 Presented to [facility] ED with AMS. PCCM consulted for ICU admission. R thora completed with 1.5L removed.  Oncology consulted. 6/15 DNR, start dilaudid PCA, palliative care consulted to discuss hospice  Interim History / Subjective:  More alert.  Had trouble sleeping.  Breathing okay.  Has diffuse pain.  Started on dilaudid PCA.  Objective:  Blood pressure 125/78, pulse (!) 120, temperature 97.8 F (36.6 C), temperature source Oral, resp. rate (!) 23, height 5\' 10"  (1.778 m), weight 106.5 kg, SpO2 100 %.    FiO2 (%):  [28 %] 28 %   Intake/Output Summary (Last 24 hours) at 08/01/2022 0838 Last data filed at 08/01/2022 0645 Gross per 24 hour  Intake 2869.58 ml  Output 1020 ml  Net 1849.58 ml   Filed Weights   07/31/22 1231 08/01/22 0500  Weight: 108.9 kg 106.5 kg   Physical Examination:  General - alert Eyes - pupils reactive ENT - no sinus tenderness, no stridor Cardiac - regular, tachycardic Chest - decreased BS at bases Abdomen - soft, non tender, + bowel sounds Extremities - 2+ edema Skin - no rashes Neuro - follows commands, moves extremities  Resolved Hospital Problem List:    Assessment & Plan:   Acute toxic  encephalopathy. - suspect related to accidental opiate overdose with poor clearance - improved on 6/15   Cough, low grade temperature elevation and concern for sepsis. - day 2 of ABx - follow up blood culture and pleural fluid culture from 6/14   New Rt exudate pleural effusion. - follow up pleural fluid cytology from 6/14 - follow up chest xray on 6/17 - might need to consider repeat therapeutic thoracentesis versus pleurx catheter placement   Ventricular tachycardia. Acute pulmonary edema. Hx of HTN. - maintained pulse and didn't have symptoms - received amiodarone bolus in ER - normal EF on Echo - monitor on telemetry   Hypokalemia. Hypomagnesemia. 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 Insomnia. - continue dilaudid pca - prn baclofen, thorazine for hiccoughs - prn compazine for nausea - prn flexeril for muscle spasms - prn restoril for sleep   Hx of PE, Lt leg DVT. - hold eliquis for now  Anemia of critical illness and chronic disease. - follow up CBC  Hypoglycemia. - continue dextrose in IV fluid - monitor CBG  Goals of care. - DNR/DNI - will consult palliative care to assess for hospice care - pt and family would like to arrange for home hospice if possible, but unclear if he will be able to survive  to hospital discharge  Best Practice: (right click and "Reselect all SmartList Selections" daily)   Diet/type: Regular consistency (see orders) DVT prophylaxis: SCDs, hold Eliquis for now GI prophylaxis: H2B Lines: Central line - Port-A-Cath Foley:  N/A Code Status:  DNR Last date of multidisciplinary goals of care discussion [updated his wife at bedside]  Labs:      Latest Ref Rng & Units  08/01/2022    5:07 AM 07/31/2022    5:14 PM 07/31/2022   12:49 PM  CMP  Glucose 70 - 99 mg/dL 161   096   BUN 6 - 20 mg/dL 27   29   Creatinine 0.45 - 1.24 mg/dL 4.09   8.11   Sodium 914 - 145 mmol/L 135   131   Potassium 3.5 - 5.1 mmol/L 3.3   3.2   Chloride 98 - 111 mmol/L 95   90   CO2 22 - 32 mmol/L 26   28   Calcium 8.9 - 10.3 mg/dL 7.8   8.2   Total Protein 6.5 - 8.1 g/dL 5.6  5.7  6.1   Total Bilirubin 0.3 - 1.2 mg/dL 2.6   2.4   Alkaline Phos 38 - 126 U/L 517   566   AST 15 - 41 U/L 38   35   ALT 0 - 44 U/L 21   24        Latest Ref Rng & Units 08/01/2022    5:07 AM 07/31/2022   12:49 PM 07/23/2022    1:49 PM  CBC  WBC 4.0 - 10.5 K/uL 8.5  9.4  16.6   Hemoglobin 13.0 - 17.0 g/dL 9.3  9.3  78.2   Hematocrit 39.0 - 52.0 % 29.9  30.4  32.2   Platelets 150 - 400 K/uL 168  161  415     CBG (last 3)  Recent Labs    07/31/22 2026 07/31/22 2333 08/01/22 0333  GLUCAP 82 98 89    Signature:  Coralyn Helling, MD [place] Pulmonary/Critical Care Pager - [phone] or [phone] 08/01/2022, 8:39 AM

## 2022-08-01 NOTE — Progress Notes (Signed)
[facility] ADULT ICU REPLACEMENT PROTOCOL   The patient does apply for the [facility] Adult ICU Electrolyte Replacment Protocol based on the criteria listed below:   1.Exclusion criteria: TCTS, ECMO, Dialysis, and Myasthenia Gravis patients 2. Is GFR >/= 30 ml/min? Yes.    Patient's GFR today is >60 3. Is SCr </= 2? Yes.   Patient's SCr is 0.82 mg/dL 4. Did SCr increase >/= 0.5 in 24 hours? No. 5.Pt's weight >40kg  Yes.   6. Abnormal electrolyte(s):   K 3.3, Mg 1.7  7. Electrolytes replaced per protocol 8.  Call MD STAT for K+ </= 2.5, Phos </= 1, or Mag </= 1 Physician:  Shawn Stall R Kyandra Mcclaine 08/01/2022 6:16 AM

## 2022-08-01 NOTE — Progress Notes (Signed)
Patient was able to sip water and use straw without complication, RN will advance diet to clear liquid as per Dr Milus Mallick verbal order.

## 2022-08-01 NOTE — Progress Notes (Signed)
[name] is a little bit more awake.  He is quite uncomfortable.  He is having quite a bit of pain.  The pain is probably from his underlying malignancy.  The pain is over on his right side of where he has the metastasis.  I really think he needs PCA.  Dilaudid has always worked well for him.  Will try PCA Dilaudid for right now.  I did talk to he and his wife about end-of-life issues.  He does NOT want to have any heroic measures taken.  If his heart stops, he does not want to be revived.  He does not want to be put on a ventilator.  I totally agree with this.  I think that if he were to go on a ventilator, he would never come off because he is so weak.  Again, I does want him to have comfort, respect, and dignity.  He has fought hard with this cancer.  He has done incredibly well.  We have been watching him for 4 years.  He had a thoracentesis yesterday.  He had 1500 cc of fluid removed from the right lung.  I am sure this is all malignant.  His labs show sodium 135.  Potassium 3.3.  BUN 27 creatinine 0.82.  Calcium is 7.8.  Albumin is 2.1.  White cell count is 8.5.  Hemoglobin 9.3.  Platelet count 168,000.  He has had no fever.  Cultures are all negative to date.  He has had no nausea or vomiting.  His vital signs are temperature 97.8.  Pulse 120.  Blood pressure 125/78.  His lungs sound relatively clear bilaterally.  Cardiac exam is tachycardic but regular.  He has no murmurs, rubs or bruits.  Abdomen is soft.  Bowel sounds are decreased.  He does have some tenderness over on the right side.  Extremities shows no clubbing, cyanosis or edema.  Neurological exam shows some generalized cognitive decline.   [name] has end-stage Hurthle cell carcinoma of the thyroid.  He has been on I think 5 different lines of therapy.  He has done incredibly well.  Now, I think that he is refractory.  He really has done well considering this kind of malignancy.  Hopefully, the PCA will help him with his  pain.  So not sure he will be able to leave the hospital.  If he does improve and is able to leave the hospital, then we can see about getting Hospice involved.  I know that while in the ICU, he will get wonderful care from the staff who are incredibly compassionate.   Christin Bach, MD  2 Timothy 4:16-18

## 2022-08-01 NOTE — Progress Notes (Signed)
eLink Physician-Brief Progress Note Patient Name: Brian [name] DOB: 07/10/1975 MRN: [id number]   Date of Service  08/01/2022  HPI/Events of Note  Pleural fluid results evaluated.  eICU Interventions  No intervention.        [name] [name] 08/01/2022, 3:14 AM

## 2022-08-01 NOTE — Progress Notes (Signed)
eLink Physician-Brief Progress Note Patient Name: Brian [name] DOB: [birth year] MRN: [id number]   Date of Service  08/01/2022  HPI/Events of Note  Patient is fast asleep at the moment, on camera-ing in to the room.  eICU Interventions  Dilaudid order changed to 0.5-1.0 mg Q 4 to be given PRN pain only.        [name] U Twila Rappa 08/01/2022, 1:50 AM

## 2022-08-01 NOTE — Progress Notes (Signed)
Developed wide complex tachycardia again with HR in 150's.  Maintaining BP and mental status.  Has mild chest discomfort.  D/w pt and wife at bedside.  Will try restarting home lopressor and give additional IV magnesium.  If difficult to control, will then consider amiodarone bolus again.  Coralyn Helling, MD [facility] Pulmonary/Critical Care Pager - [phone] or [phone] 08/01/2022, 9:08 AM

## 2022-08-02 ENCOUNTER — Other Ambulatory Visit: Payer: Self-pay

## 2022-08-02 DIAGNOSIS — C73 Malignant neoplasm of thyroid gland: Secondary | ICD-10-CM | POA: Diagnosis not present

## 2022-08-02 DIAGNOSIS — J90 Pleural effusion, not elsewhere classified: Secondary | ICD-10-CM | POA: Diagnosis not present

## 2022-08-02 DIAGNOSIS — G893 Neoplasm related pain (acute) (chronic): Secondary | ICD-10-CM | POA: Diagnosis not present

## 2022-08-02 DIAGNOSIS — R531 Weakness: Secondary | ICD-10-CM

## 2022-08-02 DIAGNOSIS — R400 Somnolence: Secondary | ICD-10-CM | POA: Diagnosis not present

## 2022-08-02 DIAGNOSIS — Z515 Encounter for palliative care: Secondary | ICD-10-CM

## 2022-08-02 DIAGNOSIS — K5903 Drug induced constipation: Secondary | ICD-10-CM

## 2022-08-02 LAB — MAGNESIUM: Magnesium: 1.9 mg/dL (ref 1.7–2.4)

## 2022-08-02 LAB — BASIC METABOLIC PANEL
Anion gap: 10 (ref 5–15)
BUN: 22 mg/dL — ABNORMAL HIGH (ref 6–20)
CO2: 26 mmol/L (ref 22–32)
Calcium: 7.5 mg/dL — ABNORMAL LOW (ref 8.9–10.3)
Chloride: 94 mmol/L — ABNORMAL LOW (ref 98–111)
Creatinine, Ser: 0.74 mg/dL (ref 0.61–1.24)
GFR, Estimated: >60 mL/min (ref >60)
Glucose, Bld: 127 mg/dL — ABNORMAL HIGH (ref 70–99)
Potassium: 3.6 mmol/L (ref 3.5–5.1)
Sodium: 130 mmol/L — ABNORMAL LOW (ref 135–145)

## 2022-08-02 LAB — GLUCOSE, CAPILLARY
Glucose-Capillary: 111 mg/dL — ABNORMAL HIGH (ref 70–99)
Glucose-Capillary: 118 mg/dL — ABNORMAL HIGH (ref 70–99)
Glucose-Capillary: 102 mg/dL — ABNORMAL HIGH (ref 70–99)
Glucose-Capillary: 118 mg/dL — ABNORMAL HIGH (ref 70–99)

## 2022-08-02 LAB — BODY FLUID CULTURE W GRAM STAIN
Culture: NO GROWTH
Gram Stain: NONE SEEN

## 2022-08-02 LAB — CULTURE, BLOOD (ROUTINE X 2): Special Requests: ADEQUATE

## 2022-08-02 MED ORDER — POLYETHYLENE GLYCOL 3350 17 G PO PACK
17.0000 g | PACK | Freq: Every day | ORAL | Status: DC
  Administered 2022-08-02 – 2022-08-05 (×3): 17 g via ORAL
  Filled 2022-08-02 (×3): qty 1

## 2022-08-02 MED ORDER — METOPROLOL TARTRATE 5 MG/5ML IV SOLN: 5.0000 mg | Freq: Once | INTRAVENOUS | Status: AC

## 2022-08-02 MED ORDER — SODIUM CHLORIDE 0.9% FLUSH
10.0000 mL | Freq: Two times a day (BID) | INTRAVENOUS | Status: DC
  Administered 2022-08-02 – 2022-08-05 (×7): 10 mL

## 2022-08-02 MED ORDER — DOCUSATE SODIUM 100 MG PO CAPS
100.0000 mg | ORAL_CAPSULE | Freq: Two times a day (BID) | ORAL | Status: DC
  Administered 2022-08-02 – 2022-08-05 (×6): 100 mg via ORAL
  Filled 2022-08-02 (×6): qty 1

## 2022-08-02 MED ORDER — POTASSIUM CHLORIDE CRYS ER 20 MEQ PO TBCR
40.0000 meq | EXTENDED_RELEASE_TABLET | Freq: Once | ORAL | Status: AC
  Administered 2022-08-02: 40 meq via ORAL
  Filled 2022-08-02: qty 2

## 2022-08-02 MED ORDER — MAGNESIUM SULFATE 2 GM/50ML IV SOLN
2.0000 g | Freq: Once | INTRAVENOUS | Status: AC
  Administered 2022-08-02: 2 g via INTRAVENOUS
  Filled 2022-08-02: qty 50

## 2022-08-02 MED ORDER — METOPROLOL TARTRATE 5 MG/5ML IV SOLN
INTRAVENOUS | Status: AC
  Administered 2022-08-02: 5 mg via INTRAVENOUS
  Filled 2022-08-02: qty 5

## 2022-08-02 NOTE — Progress Notes (Signed)
NAME:  Brian [name], MRN:  [id number], DOB:  [birth year], LOS: 2 ADMISSION DATE:  07/31/2022 CONSULTATION DATE:  07/31/2022 REFERRING MD:  Lynelle Doctor - EDP CHIEF COMPLAINT:  Low grade fever, AMS   History of Present Illness:  47 yo with metastatic thyroid cancer with progression had cough, low grade temperature an change in mental status with increased lethargy. Was recently started on new pain medication. Recent PET scan and MRI liver which showed progression of cancer and new right pleural effusion. He has more leg swelling bilaterally.   Pertinent Medical History:  HFpEF, DVT, PE, HTN, Low testosterone, Nephrolithiasis  Significant Hospital Events: Including procedures, antibiotic start and stop dates in addition to other pertinent events   6/14 Presented to [facility] ED with AMS. PCCM consulted for ICU admission. R thora completed with 1.5L removed.  Oncology consulted. 6/15 DNR, start dilaudid PCA, palliative care consulted to discuss hospice; recurrence of wide complex tachycardia  Interim History / Subjective:  More alert.  Pain better controlled.  Feels bloated, and hasn't had a bowel movement in several days.  Tolerating diet.  Objective:  Blood pressure 112/73, pulse (!) 109, temperature 97.8 F (36.6 C), temperature source Oral, resp. rate 20, height 5\' 10"  (1.778 m), weight 106.6 kg, SpO2 (!) 20 %.    FiO2 (%):  [26 %-28 %] 28 %   Intake/Output Summary (Last 24 hours) at 08/02/2022 0815 Last data filed at 08/02/2022 0354 Gross per 24 hour  Intake 640.72 ml  Output 300 ml  Net 340.72 ml   Filed Weights   07/31/22 1231 08/01/22 0500 08/02/22 0705  Weight: 108.9 kg 106.5 kg 106.6 kg   Physical Examination:  General - alert Eyes - pupils reactive ENT - no sinus tenderness, no stridor Cardiac - regular rate/rhythm, no murmur Chest - decreased BS at Rt base Abdomen - soft, non tender, + bowel sounds Extremities - 1+ edema Skin - no rashes Neuro - normal strength, moves  extremities, follows commands Psych - normal mood and behavior  Resolved Hospital Problem List:  Acute toxic encephalopathy 2nd to accidental opiate overdose, Hypoglycemia  Assessment & Plan:   Cough, low grade temperature elevation and concern for sepsis. - day 3 of Abx - follow up blood culture and pleural fluid culture from 6/14   New Rt exudate pleural effusion. - follow up pleural fluid cytology from 6/14 - follow up CXR 6/17 - might need to consider repeat therapeutic thoracentesis versus pleurx catheter placement   Wide complex tachycardia. Acute pulmonary edema. Hx of HTN. - received amiodarone bolus in ER; defer additional amiodarone - normal EF on Echo - monitor on telemetry - continue lopressor    Hypokalemia. Hypomagnesemia. 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 Insomnia. - appreciated palliative team assistance with pain control regimen - continue dilaudid PCA - prn baclofen, thorazine for hiccoughs - prn compazine for nausea - prn flexeril for muscle spasms - prn restoril for sleep   Hx of PE, Lt leg DVT. - hold eliquis for now since he might need additional pleural procedure  Anemia of critical illness and chronic disease. - follow up CBC  Constipation. - adjust bowel regime  Goals of care. - DNR/DNI - palliative care consulted to assist with symptom management  and help arrange for hospice care - he would like to arrange for home hospice if possible  Will ask [facility] to assume primary care from 6/17 and PCCM will follow as consult to assess intervention for pleural effusion.  Best Practice: (right click and "Reselect all SmartList Selections" daily)   Diet/type: Regular consistency  (see orders) DVT prophylaxis: SCDs, hold Eliquis for now GI prophylaxis: H2B Lines: Central line - Port-A-Cath Foley:  N/A Code Status:  DNR Last date of multidisciplinary goals of care discussion [updated his wife at bedside]  Labs:      Latest Ref Rng & Units 08/02/2022    5:00 AM 08/01/2022    5:07 AM 07/31/2022    5:14 PM  CMP  Glucose 70 - 99 mg/dL 829  562    BUN 6 - 20 mg/dL 22  27    Creatinine 1.30 - 1.24 mg/dL 8.65  7.84    Sodium 696 - 145 mmol/L 130  135    Potassium 3.5 - 5.1 mmol/L 3.6  3.3    Chloride 98 - 111 mmol/L 94  95    CO2 22 - 32 mmol/L 26  26    Calcium 8.9 - 10.3 mg/dL 7.5  7.8    Total Protein 6.5 - 8.1 g/dL  5.6  5.7   Total Bilirubin 0.3 - 1.2 mg/dL  2.6    Alkaline Phos 38 - 126 U/L  517    AST 15 - 41 U/L  38    ALT 0 - 44 U/L  21         Latest Ref Rng & Units 08/01/2022    5:07 AM 07/31/2022   12:49 PM 07/23/2022    1:49 PM  CBC  WBC 4.0 - 10.5 K/uL 8.5  9.4  16.6   Hemoglobin 13.0 - 17.0 g/dL 9.3  9.3  29.5   Hematocrit 39.0 - 52.0 % 29.9  30.4  32.2   Platelets 150 - 400 K/uL 168  161  415     CBG (last 3)  Recent Labs    08/01/22 2001 08/02/22 0004 08/02/22 0351  GLUCAP 117* 118* 111*    Signature:  Coralyn Helling, MD [facility] Pager - [phone] or [phone] 08/02/2022, 8:15 AM

## 2022-08-02 NOTE — Progress Notes (Signed)
Developed recurrence of wide complex tachycardia.  He felt flutter in his chest.  No other symptoms.  Maintained BP and oxygenation.  Resolved after receiving 5 mg lopressor IV.  Updated his wife at bedside.  Discussed whether we should consider po amiodarone to control these episodes better as we transition to home hospice.  They will think about this option if these episodes become more difficult to control.  Coralyn Helling, MD [facility]monary/Critical Care Pager - [phone] or [phone] 08/02/2022, 2:27 PM

## 2022-08-02 NOTE — Progress Notes (Signed)
Daily Progress Note   Patient Name: Brian [name]       Date: 08/02/2022 DOB: Oct 10, 1975  Age: 47 y.o. MRN#: [id number] Attending Physician: Coralyn Helling, MD Primary Care Physician: Koren Bound, MD Admit Date: 07/31/2022  Reason for Consultation/Follow-up: Establishing goals of care  Subjective:  Awake alert, pain better controlled, wife at bedside.   Length of Stay: 2  Current Medications: Scheduled Meds:   docusate sodium  100 mg Oral BID   HYDROmorphone   Intravenous Q4H   levothyroxine  200 mcg Oral Daily   metoprolol tartrate  50 mg Oral BID   polyethylene glycol  17 g Oral Daily   scopolamine  1 patch Transdermal Q72H   sodium chloride flush  10-40 mL Intracatheter Q12H    Continuous Infusions:  ceFEPime (MAXIPIME) IV Stopped (08/02/22 0533)    PRN Meds: baclofen, chlorproMAZINE, cyclobenzaprine, diphenhydrAMINE **OR** diphenhydrAMINE, naloxone **AND** sodium chloride flush, ondansetron (ZOFRAN) IV, mouth rinse, prochlorperazine, senna-docusate, temazepam  Physical Exam         Awake alert Sitting up in bed Trace edema Regular work of breathing Monitor noted  Vital Signs: BP 118/78 (BP Location: Left Arm)   Pulse (!) 117   Temp 97.8 F (36.6 C) (Oral)   Resp (!) 31   Ht 5\' 10"  (1.778 m)   Wt 106.6 kg   SpO2 94%   BMI 33.72 kg/m  SpO2: SpO2: 94 % O2 Device: O2 Device: Room Air O2 Flow Rate: O2 Flow Rate (L/min): 0 L/min  Intake/output summary:  Intake/Output Summary (Last 24 hours) at 08/02/2022 1047 Last data filed at 08/02/2022 0800 Gross per 24 hour  Intake 841.55 ml  Output 300 ml  Net 541.55 ml   LBM: Last BM Date :  (PTA) Baseline Weight: Weight: 108.9 kg Most recent weight: Weight: 106.6 kg       Palliative  Assessment/Data:      Patient Active Problem List   Diagnosis Date Noted   AMS (altered mental status) 07/31/2022   Pleural effusion 07/31/2022   Hypotestosteronemia in male 04/04/2021   Other long term (current) drug therapy 06/13/2020   Intractable pain 04/12/2020   Intractable abdominal pain 04/11/2020   Chronic diastolic CHF (congestive heart failure) (HCC) 04/11/2020   IDA (iron deficiency anemia) 04/09/2020   Obesity, Class [name], BMI 40-49.9 (  morbid obesity) (HCC) 02/09/2020   Hypothyroidism 02/09/2020   AKI (acute kidney injury) (HCC) 02/09/2020   Sepsis (HCC) 02/08/2020   Goals of care, counseling/discussion 01/14/2020   DVT, lower extremity, distal, acute, left (HCC) 01/14/2020   Pulmonary embolism without acute cor pulmonale (HCC) 01/13/2020   Malignant neoplasm metastatic to liver (HCC) 08/13/2019   Nephrotic range proteinuria 05/08/2019   Essential hypertension 09/09/2018   Metastasis from thyroid cancer (HCC) 07/29/2018   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   Adenocarcinoma (HCC) 05/17/2018   Primary cancer of thyroid with metastasis to other site (HCC) 05/04/2018   Extrasystole 04/26/2018   Dyspnea on exertion 04/26/2018   Atypical chest pain 04/26/2018   Ureteral stone with hydronephrosis 02/15/2018   Right thyroid nodule 04/09/2017   Bilateral knee pain 07/01/2016   Left foot pain 02/06/2016   Gastroesophageal reflux disease without esophagitis 05/08/2014   Thrombocytopenia, unspecified (HCC) 06/13/2013   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 DNR Pain management with Dilaudid PCA Overall plans for home with hospice arrangements is being considered, Pleurx catheter is being  considered.  Recommendations/Plan: PCA settings noted.  Continue current PCA settings. Likely home with hospice early next week. Appreciate TOC input.     Code Status:    Code Status Orders  (From admission, onward)           Start     Ordered   08/01/22 0657  Do not attempt resuscitation (DNR)  Continuous       Question Answer Comment  If patient has no pulse and is not breathing Do Not Attempt Resuscitation   If patient has a pulse and/or is breathing: Medical Treatment Goals COMFORT MEASURES: Keep clean/warm/dry, use medication by any route; positioning, wound care and other measures to relieve pain/suffering; use oxygen, suction/manual treatment of airway obstruction for comfort; do not transfer unless for comfort needs.   Consent: Discussion documented in EHR or advanced directives reviewed      08/01/22 0705           Code Status History     Date Active Date Inactive Code Status Order ID Comments User Context   07/31/2022 1507 08/01/2022 0705 Full Code [id number]  Tim Lair, PA-C ED   04/11/2020 2231 04/15/2020 1809 Full Code [id number]  Briscoe Deutscher, MD ED   02/09/2020 0012 02/13/2020 1956 Full Code [id number]  Cox, Amy N, DO Inpatient   01/13/2020 1802 01/15/2020 1705 Full Code [id number]  Dorcas Carrow, MD Inpatient   06/12/2013 0804 06/19/2013 1902 Full Code [id number]  Jeralyn Bennett, MD Inpatient       Prognosis:  ?few weeks.   Discharge Planning: Home with Hospice  Care plan was discussed with patient and wife.   Thank you for allowing the Palliative Medicine Team to assist in the care of this patient.  Mod MDM.     Greater than 50%  of this time was spent counseling and coordinating care related to the above assessment and plan.  Rosalin Hawking, MD  Please contact Palliative Medicine Team phone at [phone] for questions and concerns.

## 2022-08-02 NOTE — TOC Initial Note (Signed)
Transition of Care [facility]) - Initial/Assessment Note    Patient Details  Name: Brian [name] MRN: [id number] Date of Birth: [birth year]  Transition of Care [facility]) CM/SW Contact:    Lavenia Atlas, RN Phone Number: 08/02/2022, 9:32 AM  Clinical Narrative:    Received secure chat from palliative MD requesting home hospice assistance. Per MD plan is for pleurx placement then discharge home with hospice on tomorrow. This RNCM spoke with patient's wife Tiffany to offer home hospice choice. Tiffany chose [facility], and reports she was an Charity fundraiser for [employer] in the past. Elmarie Shiley is unsure of DME needs however reports does not need a hospital bed, only oxygen at this time. This RNCM left voicemail for Abby w/[facility], awaiting a call back.  TOC will continue to follow for discharge needs.                   Expected Discharge Plan: Home w Hospice Care Barriers to Discharge: Continued Medical Work up   Patient Goals and CMS Choice Patient states their goals for this hospitalization and ongoing recovery are:: return home with hospice services CMS Medicare.gov Compare Post Acute Care list provided to:: Patient Represenative (must comment) Choice offered to / list presented to : Spouse Antoine ownership interest in [facility].provided to:: Spouse    Expected Discharge Plan and Services In-house Referral: Hospice / Palliative Care Discharge Planning Services: CM Consult Post Acute Care Choice: Hospice (Abby w/[facility]) Living arrangements for the past 2 months: Single Family Home                 DME Arranged: N/A DME Agency: NA         HH Agency: Other - See comment ([facility] for home hospice) Date HH Agency Contacted: 08/02/22 Time HH Agency Contacted: [phone] Representative spoke with at [facility] Agency: Drue Dun  Prior Living Arrangements/Services Living arrangements for the past 2 months: Single Family Home Lives with:: Spouse, Minor Children Patient language and need for interpreter  reviewed:: Yes Do you feel safe going back to the place where you live?: Yes      Need for Family Participation in Patient Care: Yes (Comment) Care giver support system in place?: Yes (comment) Current home services: Other (comment) (none) Criminal Activity/Legal Involvement Pertinent to Current Situation/Hospitalization: No - Comment as needed  Activities of Daily Living Home Assistive Devices/Equipment: None ADL Screening (condition at time of admission) Patient's cognitive ability adequate to safely complete daily activities?: Yes Is the patient deaf or have difficulty hearing?: No Does the patient have difficulty seeing, even when wearing glasses/contacts?: No Does the patient have difficulty concentrating, remembering, or making decisions?: No Patient able to express need for assistance with ADLs?: Yes Does the patient have difficulty dressing or bathing?: Yes Independently performs ADLs?: No Does the patient have difficulty walking or climbing stairs?: Yes Weakness of Legs: Both Weakness of Arms/Hands: Both  Permission Sought/Granted Permission sought to share information with : Case Manager Permission granted to share information with : Yes, Verbal Permission Granted  Share Information with NAME: Case Manager           Emotional Assessment Appearance:: Appears stated age Attitude/Demeanor/Rapport: Unable to Assess Affect (typically observed): Unable to Assess Orientation: :  (AMS) Alcohol / Substance Use: Not Applicable Psych Involvement: No (comment)  Admission diagnosis:  Hyponatremia [E87.1] Peripheral edema [R60.0] Pleural effusion [J90] AMS (altered mental status) [R41.82] Sepsis, due to unspecified organism, unspecified whether acute organ dysfunction present [facility]) [A41.9] Patient Active  Problem List   Diagnosis Date Noted   AMS (altered mental status) 07/31/2022   Pleural effusion 07/31/2022   Hypotestosteronemia in male 04/04/2021   Other long term  (current) drug therapy 06/13/2020   Intractable pain 04/12/2020   Intractable abdominal pain 04/11/2020   Chronic diastolic CHF (congestive heart failure) (HCC) 04/11/2020   IDA (iron deficiency anemia) 04/09/2020   Obesity, Class III, BMI 40-49.9 (morbid obesity) (HCC) 02/09/2020   Hypothyroidism 02/09/2020   AKI (acute kidney injury) (HCC) 02/09/2020   Sepsis (HCC) 02/08/2020   Goals of care, counseling/discussion 01/14/2020   DVT, lower extremity, distal, acute, left (HCC) 01/14/2020   Pulmonary embolism without acute cor pulmonale (HCC) 01/13/2020   Malignant neoplasm metastatic to liver (HCC) 08/13/2019   Nephrotic range proteinuria 05/08/2019   Essential hypertension 09/09/2018   Metastasis from thyroid cancer (HCC) 07/29/2018   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   Adenocarcinoma (HCC) 05/17/2018   Primary cancer of thyroid with metastasis to other site (HCC) 05/04/2018   Extrasystole 04/26/2018   Dyspnea on exertion 04/26/2018   Atypical chest pain 04/26/2018   Ureteral stone with hydronephrosis 02/15/2018   Right thyroid nodule 04/09/2017   Bilateral knee pain 07/01/2016   Left foot pain 02/06/2016   Gastroesophageal reflux disease without esophagitis 05/08/2014   Thrombocytopenia, unspecified (HCC) 06/13/2013   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   Low back pain 05/12/2012   Allergic rhinitis 06/03/2009   PCP:  Koren Bound, MD Pharmacy:   [facility] [place] [facility] [address] [place] [address] Phone: [phone] Fax: [phone]     Social Determinants of Health (SDOH) Social History: SDOH Screenings   Food Insecurity: No Food Insecurity (08/01/2022)  Housing: Low Risk  (08/01/2022)  Transportation Needs: No Transportation Needs (08/01/2022)  Utilities: Not At Risk (08/01/2022)  Tobacco Use: Low Risk  (07/31/2022)   SDOH Interventions:     Readmission Risk Interventions    08/02/2022    9:20 AM  02/12/2020   12:37 PM 01/15/2020   10:25 AM  Readmission Risk Prevention Plan  Transportation Screening Complete Complete Complete  PCP or Specialist Appt within 3-5 Days  Complete   HRI or Home Care Consult  Complete   Social Work Consult for Recovery Care Planning/Counseling  Complete   Palliative Care Screening  Complete   Medication Review Oceanographer) Complete Complete Complete  PCP or Specialist appointment within 3-5 days of discharge Complete  Complete  HRI or Home Care Consult Complete  Complete  SW Recovery Care/Counseling Consult Complete  Complete  Palliative Care Screening Complete  Not Applicable  Skilled Nursing Facility Not Applicable  Not Applicable

## 2022-08-02 NOTE — Progress Notes (Signed)
   08/01/22 1028  Spiritual Encounters  Type of Visit Initial  Care provided to: Pt and family  Conversation partners present during encounter Nurse  Referral source Nurse (RN/NT/LPN)  Reason for visit End-of-life  OnCall Visit Yes  Spiritual Framework  Presenting Themes Impactful experiences and emotions;Rituals and practive;Courage hope and growth  Family Stress Factors Loss of control;Major life changes;Loss  Interventions  Spiritual Care Interventions Made Established relationship of care and support;Compassionate presence;Reflective listening;Normalization of emotions;Reconciliation with self/others;Narrative/life review;Meaning making;Bereavement/grief support  Spiritual Care Plan  Spiritual Care Issues Still Outstanding Chaplain will continue to follow  Advance Directives (For Healthcare)  Does Patient Have a Medical Advance Directive? No  Does patient want to make changes to medical advance directive? No - Patient declined  Would patient like information on creating a medical advance directive? No - Patient declined  Mental Health Advance Directives  Does Patient Have a Mental Health Advance Directive? No   Chaplai met wth patient who wa asleep - met father, wife, and two yound chilren.  Provided spiritual care and support of patient;s family.  Their Anticipatory Grief.  Clearly worked with 47 year old son, Jaryn Zetterberg, who struggled to express his heartfelt emotions.

## 2022-08-02 NOTE — Progress Notes (Signed)
[facility] ADULT ICU REPLACEMENT PROTOCOL   The patient does apply for the [facility] Adult ICU Electrolyte Replacment Protocol based on the criteria listed below:   1.Exclusion criteria: TCTS, ECMO, Dialysis, and Myasthenia Gravis patients 2. Is GFR >/= 30 ml/min? Yes.    Patient's GFR today is >60 3. Is SCr </= 2? Yes.   Patient's SCr is 0.74 mg/dL 4. Did SCr increase >/= 0.5 in 24 hours? No. 5.Pt's weight >40kg  Yes.   6. Abnormal electrolyte(s): K, Mag  7. Electrolytes replaced per protocol 8.  Call MD STAT for K+ </= 2.5, Phos </= 1, or Mag </= 1 Physician:  [facility] 08/02/2022 5:44 AM

## 2022-08-03 ENCOUNTER — Inpatient Hospital Stay: Payer: BC Managed Care – PPO | Admitting: Hematology & Oncology

## 2022-08-03 ENCOUNTER — Inpatient Hospital Stay: Payer: BC Managed Care – PPO

## 2022-08-03 ENCOUNTER — Inpatient Hospital Stay (HOSPITAL_COMMUNITY): Payer: BC Managed Care – PPO

## 2022-08-03 DIAGNOSIS — A419 Sepsis, unspecified organism: Secondary | ICD-10-CM | POA: Diagnosis not present

## 2022-08-03 DIAGNOSIS — E871 Hypo-osmolality and hyponatremia: Secondary | ICD-10-CM | POA: Diagnosis not present

## 2022-08-03 DIAGNOSIS — J90 Pleural effusion, not elsewhere classified: Secondary | ICD-10-CM | POA: Diagnosis not present

## 2022-08-03 LAB — BASIC METABOLIC PANEL
Anion gap: 10 (ref 5–15)
BUN: 20 mg/dL (ref 6–20)
CO2: 27 mmol/L (ref 22–32)
Calcium: 7.6 mg/dL — ABNORMAL LOW (ref 8.9–10.3)
Chloride: 92 mmol/L — ABNORMAL LOW (ref 98–111)
Creatinine, Ser: 0.7 mg/dL (ref 0.61–1.24)
GFR, Estimated: >60 mL/min (ref >60)
Glucose, Bld: 110 mg/dL — ABNORMAL HIGH (ref 70–99)
Potassium: 3.7 mmol/L (ref 3.5–5.1)
Sodium: 129 mmol/L — ABNORMAL LOW (ref 135–145)

## 2022-08-03 LAB — CBC
HCT: 30.4 % — ABNORMAL LOW (ref 39.0–52.0)
Hemoglobin: 9.3 g/dL — ABNORMAL LOW (ref 13.0–17.0)
MCH: 29.1 pg (ref 26.0–34.0)
MCHC: 30.6 g/dL (ref 30.0–36.0)
MCV: 95 fL (ref 80.0–100.0)
Platelets: 167 10*3/uL (ref 150–400)
RBC: 3.2 MIL/uL — ABNORMAL LOW (ref 4.22–5.81)
RDW: 19.4 % — ABNORMAL HIGH (ref 11.5–15.5)
WBC: 11 10*3/uL — ABNORMAL HIGH (ref 4.0–10.5)
nRBC: 0 % (ref 0.0–0.2)

## 2022-08-03 LAB — CULTURE, BLOOD (ROUTINE X 2): Culture: NO GROWTH

## 2022-08-03 LAB — MAGNESIUM: Magnesium: 2 mg/dL (ref 1.7–2.4)

## 2022-08-03 MED ORDER — METOPROLOL TARTRATE 25 MG PO TABS: 50.0000 mg | ORAL_TABLET | Freq: Two times a day (BID) | ORAL | Status: DC | PRN

## 2022-08-03 MED ORDER — AMIODARONE HCL 200 MG PO TABS
200.0000 mg | ORAL_TABLET | Freq: Every day | ORAL | Status: DC
  Administered 2022-08-03 – 2022-08-05 (×2): 200 mg via ORAL
  Filled 2022-08-03 (×2): qty 1

## 2022-08-03 MED ORDER — SENNOSIDES-DOCUSATE SODIUM 8.6-50 MG PO TABS
1.0000 | ORAL_TABLET | Freq: Two times a day (BID) | ORAL | Status: DC | PRN
  Administered 2022-08-03 (×2): 1 via ORAL
  Filled 2022-08-03 (×2): qty 1

## 2022-08-03 MED ORDER — AMIODARONE IV BOLUS ONLY 150 MG/100ML
INTRAVENOUS | Status: AC
  Filled 2022-08-03: qty 100

## 2022-08-03 MED ORDER — AMIODARONE IV BOLUS ONLY 150 MG/100ML
150.0000 mg | Freq: Once | INTRAVENOUS | Status: AC
  Administered 2022-08-03: 150 mg via INTRAVENOUS

## 2022-08-03 MED ORDER — POTASSIUM CHLORIDE CRYS ER 20 MEQ PO TBCR
40.0000 meq | EXTENDED_RELEASE_TABLET | Freq: Once | ORAL | Status: AC
  Administered 2022-08-03: 40 meq via ORAL
  Filled 2022-08-03: qty 2

## 2022-08-03 NOTE — Progress Notes (Signed)
PROGRESS NOTE    Brian [name]  YQM:[id number] DOB: Sep 25, 1975 DOA: 07/31/2022 PCP: Koren Bound, MD    Brief Narrative:  47 yo with metastatic thyroid cancer with progression had cough, low grade temperature an change in mental status with increased lethargy. Was recently started on new pain medication. Recent PET scan and MRI liver which showed progression of cancer and new right pleural effusion. He has more leg swelling bilaterally.     Assessment and Plan: Cough, low grade temperature elevation and concern for sepsis. -cultures negative  New Rt exudate pleural effusion. - follow up pleural fluid cytology from 6/14 - might need to consider repeat therapeutic thoracentesis versus pleurx catheter placement- family to decide with PCCM guidance    Wide complex tachycardia. Acute pulmonary edema. Hx of HTN. - received amiodarone bolus in ER; defer additional amiodarone - normal EF on Echo - monitor on telemetry - continue lopressor    Hypokalemia. Hypomagnesemia. 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 Insomnia. - appreciated palliative team assistance with pain control regimen - continue dilaudid PCA - prn baclofen, thorazine for hiccoughs - prn compazine for nausea - prn flexeril for muscle spasms - prn restoril for sleep   Hx of PE, Lt leg DVT. - hold eliquis for now since he might need additional pleural procedure   Anemia of critical illness and chronic disease. - follow up CBC   Constipation. - adjust bowel regime   Goals of care. - DNR/DNI - palliative care consulted to assist with symptom management and help arrange for hospice care - he would like to arrange for home hospice if possible--  tentative plan for wednesday        DVT prophylaxis: SCDs Start: 07/31/22 1504    Code Status: DNR Family Communication:   Disposition Plan:  Level of care: ICU Status is: Inpatient Remains inpatient appropriate     Consultants:  Oncology PCCM Palliative care   Subjective: No overnight events Still with complaints of constipation  Objective: Vitals:   08/02/22 2000 08/02/22 2338 08/03/22 0200 08/03/22 0400  BP:   110/80   Pulse:   95   Resp:  (!) 22 20 20   Temp: 100 F (37.8 C)     TempSrc: Oral     SpO2:  97% 99% 98%  Weight:      Height:        Intake/Output Summary (Last 24 hours) at 08/03/2022 5284 Last data filed at 08/02/2022 2045 Gross per 24 hour  Intake 99.01 ml  Output 550 ml  Net -450.99 ml   Filed Weights   07/31/22 1231 08/01/22 0500 08/02/22 0705  Weight: 108.9 kg 106.5 kg 106.6 kg    Examination:   General: Appearance:    Obese male in no acute distress     Lungs:     respirations unlabored  Heart:    Normal heart rate.    MS:   All extremities are intact. + edema   Neurologic:   Awake, alert       Data Reviewed: I have personally reviewed following labs and imaging studies  CBC: Recent Labs  Lab 07/31/22 1249 08/01/22 0507 08/03/22 0321  WBC 9.4 8.5 11.0*  NEUTROABS 8.0*  --   --  HGB 9.3* 9.3* 9.3*  HCT 30.4* 29.9* 30.4*  MCV 93.0 93.1 95.0  PLT 161 168 167   Basic Metabolic Panel: Recent Labs  Lab 07/31/22 1249 08/01/22 0507 08/02/22 0500 08/03/22 0321  NA 131* 135 130* 129*  K 3.2* 3.3* 3.6 3.7  CL 90* 95* 94* 92*  CO2 28 26 26 27   GLUCOSE 106* 107* 127* 110*  BUN 29* 27* 22* 20  CREATININE 0.82 0.82 0.74 0.70  CALCIUM 8.2* 7.8* 7.5* 7.6*  MG 1.6* 1.7 1.9 2.0  PHOS  --  3.9  --   --    GFR: Estimated Creatinine Clearance: 141 mL/min (by C-G formula based on SCr of 0.7 mg/dL). Liver Function Tests: Recent Labs  Lab 07/31/22 1249 07/31/22 1714 08/01/22 0507  AST 35  --  38  ALT 24  --  21   ALKPHOS 566*  --  517*  BILITOT 2.4*  --  2.6*  PROT 6.1* 5.7* 5.6*  ALBUMIN 2.6*  --  2.1*   No results for input(s): "LIPASE", "AMYLASE" in the last 168 hours. Recent Labs  Lab 07/31/22 1507  AMMONIA 28   Coagulation Profile: Recent Labs  Lab 07/31/22 1249  INR 2.2*   Cardiac Enzymes: No results for input(s): "CKTOTAL", "CKMB", "CKMBINDEX", "TROPONINI" in the last 168 hours. BNP (last 3 results) No results for input(s): "PROBNP" in the last 8760 hours. HbA1C: No results for input(s): "HGBA1C" in the last 72 hours. CBG: Recent Labs  Lab 08/01/22 2001 08/02/22 0004 08/02/22 0351 08/02/22 0821 08/02/22 2010  GLUCAP 117* 118* 111* 102* 118*   Lipid Profile: No results for input(s): "CHOL", "HDL", "LDLCALC", "TRIG", "CHOLHDL", "LDLDIRECT" in the last 72 hours. Thyroid Function Tests: Recent Labs    07/31/22 1324  TSH 10.733*  FREET4 0.66   Anemia Panel: No results for input(s): "VITAMINB12", "FOLATE", "FERRITIN", "TIBC", "IRON", "RETICCTPCT" in the last 72 hours. Sepsis Labs: Recent Labs  Lab 07/31/22 1249 07/31/22 1507  PROCALCITON  --  3.21  LATICACIDVEN 2.9* 2.4*    Recent Results (from the past 240 hour(s))  Resp panel by RT-PCR (RSV, Flu A&B, Covid) Anterior Nasal Swab     Status: None   Collection Time: 07/31/22  1:08 PM   Specimen: Anterior Nasal Swab  Result Value Ref Range Status   SARS Coronavirus 2 by RT PCR NEGATIVE NEGATIVE Final    Comment: (NOTE) SARS-CoV-2 target nucleic acids are NOT DETECTED.  The SARS-CoV-2 RNA is generally detectable in upper respiratory specimens during the acute phase of infection. The lowest concentration of SARS-CoV-2 viral copies this assay can detect is 138 copies/mL. A negative result does not preclude SARS-Cov-2 infection and should not be used as the sole basis for treatment or other patient management decisions. A negative result may occur with  improper specimen collection/handling, submission of  specimen other than nasopharyngeal swab, presence of viral mutation(s) within the areas targeted by this assay, and inadequate number of viral copies(<138 copies/mL). A negative result must be combined with clinical observations, patient history, and epidemiological information. The expected result is Negative.  Fact Sheet for Patients:  BloggerCourse.com  Fact Sheet for Healthcare Providers:  SeriousBroker.it  This test is no t yet approved or cleared by the [place] FDA and  has been authorized for detection and/or diagnosis of SARS-CoV-2 by FDA under an Emergency Use Authorization (EUA). This EUA will remain  in effect (meaning this test can be used) for the duration of the COVID-19 declaration under Section 564(b)(1) of  the Act, 21 U.S.C.section 360bbb-3(b)(1), unless the authorization is terminated  or revoked sooner.       Influenza A by PCR NEGATIVE NEGATIVE Final   Influenza B by PCR NEGATIVE NEGATIVE Final    Comment: (NOTE) The Xpert Xpress SARS-CoV-2/FLU/RSV plus assay is intended as an aid in the diagnosis of influenza from Nasopharyngeal swab specimens and should not be used as a sole basis for treatment. Nasal washings and aspirates are unacceptable for Xpert Xpress SARS-CoV-2/FLU/RSV testing.  Fact Sheet for Patients: BloggerCourse.com  Fact Sheet for Healthcare Providers: SeriousBroker.it  This test is not yet approved or cleared by the [place] FDA and has been authorized for detection and/or diagnosis of SARS-CoV-2 by FDA under an Emergency Use Authorization (EUA). This EUA will remain in effect (meaning this test can be used) for the duration of the COVID-19 declaration under Section 564(b)(1) of the Act, 21 U.S.C. section 360bbb-3(b)(1), unless the authorization is terminated or revoked.     Resp Syncytial Virus by PCR NEGATIVE NEGATIVE Final     Comment: (NOTE) Fact Sheet for Patients: BloggerCourse.com  Fact Sheet for Healthcare Providers: SeriousBroker.it  This test is not yet approved or cleared by the [place] FDA and has been authorized for detection and/or diagnosis of SARS-CoV-2 by FDA under an Emergency Use Authorization (EUA). This EUA will remain in effect (meaning this test can be used) for the duration of the COVID-19 declaration under Section 564(b)(1) of the Act, 21 U.S.C. section 360bbb-3(b)(1), unless the authorization is terminated or revoked.  Performed at [facility], [address], [place], [place] [address]   Blood Culture (routine x 2)     Status: None (Preliminary result)   Collection Time: 07/31/22  1:24 PM   Specimen: BLOOD  Result Value Ref Range Status   Specimen Description   Final    BLOOD PORTA CATH Performed at [facility], [address], [place], [place] [address]    Special Requests   Final    BOTTLES DRAWN AEROBIC AND ANAEROBIC Blood Culture adequate volume Performed at [facility], [address] [place], [place] [address]    Culture   Final    NO GROWTH 3 DAYS Performed at [facility], [address], [place], [place] [address]    Report Status PENDING  Incomplete  Blood Culture (routine x 2)     Status: None (Preliminary result)   Collection Time: 07/31/22  1:41 PM   Specimen: BLOOD  Result Value Ref Range Status   Specimen Description   Final    BLOOD LEFT ANTECUBITAL Performed at [facility], [address], [place], [place] [address]    Special Requests   Final    BOTTLES DRAWN AEROBIC AND ANAEROBIC Blood Culture adequate volume Performed at [facility], [address], [place], [place] [address]    Culture   Final    NO GROWTH 3 DAYS Performed at [facility], [address], [place], [place] [address]    Report Status  PENDING  Incomplete  MRSA Next Gen by PCR, Nasal     Status: None   Collection Time: 07/31/22  5:58 PM   Specimen: Nasal Mucosa; Nasal Swab  Result Value Ref Range Status   MRSA by PCR Next Gen NOT DETECTED NOT DETECTED Final    Comment: (NOTE) The GeneXpert MRSA Assay (FDA approved for NASAL specimens only), is one component of a comprehensive MRSA colonization surveillance  program. It is not intended to diagnose MRSA infection nor to guide or monitor treatment for MRSA infections. Test performance is not FDA approved in patients less than 38 years old. Performed at [facility], [address], [place], [place] [address]   Body fluid culture w Gram Stain     Status: None (Preliminary result)   Collection Time: 07/31/22  5:58 PM   Specimen: Pleural Fluid  Result Value Ref Range Status   Specimen Description   Final    PLEURAL Performed at [facility], [address], [place], [place] [address]    Special Requests   Final    NONE Performed at [facility], [address], [place], [place] [address]    Gram Stain NO WBC SEEN NO ORGANISMS SEEN   Final   Culture   Final    NO GROWTH 1 DAY Performed at [facility], [address] [place], [place] [address]    Report Status PENDING  Incomplete         Radiology Studies: DG Chest Port 1 View  Result Date: 08/03/2022 CLINICAL DATA:  [id number] with pleural effusion. EXAM: PORTABLE CHEST 1 VIEW COMPARISON:  Portable chest 08/01/2022 FINDINGS: 6:05 a.m. Right IJ port catheter terminates about the superior cavoatrial junction as before. Moderate right pleural effusion is again noted exaggerated by chronic elevation of the right hemidiaphragm. There is overlying atelectasis or consolidation in the hypoinflated right lower lung field. Left lung is clear. Cardiac size is stable. No vascular congestion is seen. Stable mediastinum. Compare: Overall aeration seems unchanged.  IMPRESSION: Moderate right pleural effusion with overlying atelectasis or consolidation in the hypoinflated right lower lung field. No significant change from yesterday's study. Electronically Signed   By: Almira Bar M.D.   On: 08/03/2022 07:30        Scheduled Meds:  docusate sodium  100 mg Oral BID   HYDROmorphone   Intravenous Q4H   levothyroxine  200 mcg Oral Daily   metoprolol tartrate  50 mg Oral BID   polyethylene glycol  17 g Oral Daily   scopolamine  1 patch Transdermal Q72H   sodium chloride flush  10-40 mL Intracatheter Q12H   Continuous Infusions:  ceFEPime (MAXIPIME) IV Stopped (08/03/22 0601)     LOS: 3 days    Time spent: 45 minutes spent on chart review, discussion with nursing staff, consultants, updating family and interview/physical exam; more than 50% of that time was spent in counseling and/or coordination of care.    Joseph Art, DO [facility] Available via Epic secure chat 7am-7pm After these hours, please refer to coverage provider listed on [email or web address] 08/03/2022, 8:08 AM

## 2022-08-03 NOTE — TOC Progression Note (Addendum)
Transition of Care [facility]) - Progression Note    Patient Details  Name: Brian [name] MRN: [id number] Date of Birth: Dec 14, 1975  Transition of Care [facility]) CM/SW Contact  Lavenia Atlas, RN Phone Number: 08/03/2022, 11:47 AM  Clinical Narrative:   Left voicemail with Abby with [facility], following up on home hospice status. Awaiting a call back.  TOC will continue to follow  - 12:36pm Received inbound call from Abby with [facility] who reports RN Jacki Cones is following this patient, with a potential 2pm set up tomorrow or Wednesday. Home hospice currently working on PCA pump.   -12:52pm Received inbound call from Jacki Cones w/[facility] who reports she has outreached to patient's wife however pt's wife has family member in the hospital. Janice Coffin will continue to coordinate as appoprpriate.  TOC will continue to follow for needs.  - 1:38pm Spoke w/ Jacki Cones with [facility] who reports plan to set patient up with home hospice on Wednesday 08/05/22 at 2pm. Will need RX for PCA and all meds escribed to [facility] pharmacy prior to dc, will notify MD. Janice Coffin will provide home oxygen and wheelchair.  Transportation at discharge: Patient's wife or potentially ambulance transport due to need for PCA scheduling.   TOC will continue to follow.   Expected Discharge Plan: Home w Hospice Care Barriers to Discharge: Continued Medical Work up  Expected Discharge Plan and Services In-house Referral: Hospice / Palliative Care Discharge Planning Services: CM Consult Post Acute Care Choice: Hospice (Abby w/[facility]) Living arrangements for the past 2 months: Single Family Home                 DME Arranged: N/A DME Agency: NA         HH Agency: Other - See comment ([facility] for home hospice) Date HH Agency Contacted: 08/02/22 Time HH Agency Contacted: [phone] Representative spoke with at [facility] Agency: Drue Dun   Social Determinants of Health (SDOH) Interventions SDOH Screenings   Food Insecurity: No Food Insecurity  (08/01/2022)  Housing: Low Risk  (08/01/2022)  Transportation Needs: No Transportation Needs (08/01/2022)  Utilities: Not At Risk (08/01/2022)  Tobacco Use: Low Risk  (07/31/2022)    Readmission Risk Interventions    08/02/2022    9:20 AM 02/12/2020   12:37 PM 01/15/2020   10:25 AM  Readmission Risk Prevention Plan  Transportation Screening Complete Complete Complete  PCP or Specialist Appt within 3-5 Days  Complete   HRI or Home Care Consult  Complete   Social Work Consult for Recovery Care Planning/Counseling  Complete   Palliative Care Screening  Complete   Medication Review Oceanographer) Complete Complete Complete  PCP or Specialist appointment within 3-5 days of discharge Complete  Complete  HRI or Home Care Consult Complete  Complete  SW Recovery Care/Counseling Consult Complete  Complete  Palliative Care Screening Complete  Not Applicable  Skilled Nursing Facility Not Applicable  Not Applicable

## 2022-08-03 NOTE — Progress Notes (Signed)
PCCM Progress Note  Patient and family have opted to proceed with Pleurx cath placement. IR consulted to assist in placement as no Pulmonary provider is in house that is certified to place. NPO at midnight order placed.   Brian Bermea D. Harris, NP-C [facility] Personal contact information can be found on Amion  If no contact or response made please call [phone] 08/03/2022, 1:22 PM

## 2022-08-03 NOTE — Progress Notes (Addendum)
NAME:  Brian [name], MRN:  [id number], DOB:  07-18-1975, LOS: 3 ADMISSION DATE:  07/31/2022 CONSULTATION DATE:  07/31/2022 REFERRING MD:  Lynelle Doctor - EDP CHIEF COMPLAINT:  Low grade fever, AMS   History of Present Illness:  47 yo with metastatic thyroid cancer with progression had cough, low grade temperature an change in mental status with increased lethargy. Was recently started on new pain medication. Recent PET scan and MRI liver which showed progression of cancer and new right pleural effusion. He has more leg swelling bilaterally.   Pertinent Medical History:  HFpEF, DVT, PE, HTN, Low testosterone, Nephrolithiasis  Significant Hospital Events: Including procedures, antibiotic start and stop dates in addition to other pertinent events   6/14 Presented to [facility] ED with AMS. PCCM consulted for ICU admission. R thora completed with 1.5L removed.  Oncology consulted. 6/15 DNR, start dilaudid PCA, palliative care consulted to discuss hospice; recurrence of wide complex tachycardia 6/17 Intermittent wide complex tachycardia overnight, managed with IV beta blocker  Interim History / Subjective:  Patient reports better controlled pain but intermittent palpitations with wide-complex tachycardia.  Also reports he feels flushed and weak this morning.  Spouse at bedside and updated  Objective:  Blood pressure 110/80, pulse 95, temperature 100 F (37.8 C), temperature source Oral, resp. rate 20, height 5\' 10"  (1.778 m), weight 106.6 kg, SpO2 98 %.    FiO2 (%):  [28 %] 28 %   Intake/Output Summary (Last 24 hours) at 08/03/2022 0841 Last data filed at 08/02/2022 2045 Gross per 24 hour  Intake 99.01 ml  Output 550 ml  Net -450.99 ml    Filed Weights   07/31/22 1231 08/01/22 0500 08/02/22 0705  Weight: 108.9 kg 106.5 kg 106.6 kg   Physical Examination: General: Acute on chronic ill-appearing adult male sitting up in bed in no acute distress HEENT: [place]/AT, MM pink/moist, PERRL,  Neuro: Alert  and oriented x 3, nonfocal CV: s1s2 regular rate and rhythm, no murmur, rubs, or gallops,  PULM: Diminished breath sounds bilaterally, no increased work of breathing, no added breath sounds GI: soft, bowel sounds active in all 4 quadrants, non-tender, non-distended, tolerating TF Extremities: warm/dry, 1+ edema  Skin: no rashes or lesions   Resolved Hospital Problem List:  Acute toxic encephalopathy 2nd to accidental opiate overdose, Hypoglycemia  Assessment & Plan:   Cough, low grade temperature elevation and concern for sepsis. New Rt exudate pleural effusion. Wide complex tachycardia. Acute pulmonary edema. Hx of HTN. Hypokalemia. Hypomagnesemia. 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 Insomnia. Hx of PE, Lt leg DVT. Anemia of critical illness and chronic disease. Constipation  Pulmonary problem list  New Rt exudate pleural effusion -Right thoracentesis 6/14 with fluids removed   Acute pulmonary edema Paroxysmal wide-complex tachycardia P: Continue supplemental oxygen  Coordinate with patient and care team to develop plan for management of pleural effusion. Depending on patient wishes proceed with repeat therapeutic thoracentesis prior to discharge vs symptom management only vs Pleurx by IR. Patient and family deciding on plan of care   Best Practice: (right click and "Reselect all SmartList Selections" daily)  Per primary   Signature:  Whitney D. Harris, NP-C [facility] Personal contact information can be found on Amion  If no contact or response made please call [phone] 08/03/2022, 9:39 AM  Critical care attending attestation note:  Patient seen and examined and relevant ancillary tests reviewed.  I agree  with the assessment and plan of care as outlined by Janeann Forehand, NP.   47 year old with metastatic thyroid cancer and recurrent right pleural  effusion.  Exudative via lights criteria.  Recurred.  Cytology not back yet.  Synopsis of assessment and plan:  Recurrent right pleural effusion: Suspect metastatic based on cell count and exudative.  Cytology not resulted yet. -- IR consult for consideration of Pleurx catheter per family request, [facility] happy to assist in the outpatient setting as needed versus hospice management of catheter  PCCM will sign off  CRITICAL CARE N/a  Vilma Meckel, MD  ICU Physician [facility]  See Amion for pager info  08/03/2022, 1:52 PM

## 2022-08-03 NOTE — Progress Notes (Signed)
Daily Progress Note   Patient Name: Brian [name]       Date: 08/03/2022 DOB: 04/30/1975  Age: 47 y.o. MRN#: [id number] Attending Physician: Joseph Art, DO Primary Care Physician: Koren Bound, MD Admit Date: 07/31/2022  Reason for Consultation/Follow-up: Establishing goals of care  Subjective:  Awake alert, pain better controlled, family at bedside. PCA needs noted.   Length of Stay: 3  Current Medications: Scheduled Meds:   amiodarone  200 mg Oral Daily   docusate sodium  100 mg Oral BID   HYDROmorphone   Intravenous Q4H   levothyroxine  200 mcg Oral Daily   polyethylene glycol  17 g Oral Daily   scopolamine  1 patch Transdermal Q72H   sodium chloride flush  10-40 mL Intracatheter Q12H    Continuous Infusions:  ceFEPime (MAXIPIME) IV 2 g (08/03/22 1335)    PRN Meds: baclofen, chlorproMAZINE, cyclobenzaprine, diphenhydrAMINE **OR** diphenhydrAMINE, metoprolol tartrate, naloxone **AND** sodium chloride flush, ondansetron (ZOFRAN) IV, mouth rinse, senna-docusate, temazepam  Physical Exam         Awake alert Sitting up in bed Trace edema Regular work of breathing Monitor noted  Vital Signs: BP 110/75   Pulse 98   Temp 98.3 F (36.8 C) (Oral)   Resp 18   Ht 5\' 10"  (1.778 m)   Wt 106.6 kg   SpO2 96%   BMI 33.72 kg/m  SpO2: SpO2: 96 % O2 Device: O2 Device: Room Air O2 Flow Rate: O2 Flow Rate (L/min): 0 L/min  Intake/output summary:  Intake/Output Summary (Last 24 hours) at 08/03/2022 1444 Last data filed at 08/03/2022 0930 Gross per 24 hour  Intake 388.12 ml  Output 325 ml  Net 63.12 ml    LBM: Last BM Date :  (PTA) Baseline Weight: Weight: 108.9 kg Most recent weight: Weight: 106.6 kg       Palliative Assessment/Data:      Patient Active  Problem List   Diagnosis Date Noted   Palliative care by specialist 08/02/2022   General weakness 08/02/2022   AMS (altered mental status) 07/31/2022   Pleural effusion 07/31/2022   Hypotestosteronemia in male 04/04/2021   Other long term (current) drug therapy 06/13/2020   Intractable pain 04/12/2020   Intractable abdominal pain 04/11/2020   Chronic diastolic CHF (congestive heart failure) (HCC) 04/11/2020  IDA (iron deficiency anemia) 04/09/2020   Obesity, Class [name], BMI 40-49.9 (morbid obesity) (HCC) 02/09/2020   Hypothyroidism 02/09/2020   AKI (acute kidney injury) (HCC) 02/09/2020   Sepsis (HCC) 02/08/2020   Goals of care, counseling/discussion 01/14/2020   DVT, lower extremity, distal, acute, left (HCC) 01/14/2020   Pulmonary embolism without acute cor pulmonale (HCC) 01/13/2020   Malignant neoplasm metastatic to liver (HCC) 08/13/2019   Nephrotic range proteinuria 05/08/2019   Essential hypertension 09/09/2018   Metastasis from thyroid cancer (HCC) 07/29/2018   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   Adenocarcinoma (HCC) 05/17/2018   Primary cancer of thyroid with metastasis to other site (HCC) 05/04/2018   Extrasystole 04/26/2018   Dyspnea on exertion 04/26/2018   Atypical chest pain 04/26/2018   Ureteral stone with hydronephrosis 02/15/2018   Right thyroid nodule 04/09/2017   Bilateral knee pain 07/01/2016   Left foot pain 02/06/2016   Gastroesophageal reflux disease without esophagitis 05/08/2014   Thrombocytopenia, unspecified (HCC) 06/13/2013   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 DNR Pain management with Dilaudid PCA Overall plans for home with hospice arrangements is being considered, Pleurx  catheter is being considered.  Recommendations/Plan: PCA settings noted.  Continue current PCA settings. Basal rate is 0.8 mg/hour, bolus rate is 0.5 mg Q 10 minutes, continue current settings for now, will monitor PCA needs after pleur X catheter placement to be done on 08-04-22.  Likely home with hospice on 08-05-22. PMT to work with [facility] and hospice liaison to assist with prescription for PCA, based on patient's opioid needs post pleur X placement.  Appreciate TOC input.     Code Status:    Code Status Orders  (From admission, onward)           Start     Ordered   08/01/22 0657  Do not attempt resuscitation (DNR)  Continuous       Question Answer Comment  If patient has no pulse and is not breathing Do Not Attempt Resuscitation   If patient has a pulse and/or is breathing: Medical Treatment Goals COMFORT MEASURES: Keep clean/warm/dry, use medication by any route; positioning, wound care and other measures to relieve pain/suffering; use oxygen, suction/manual treatment of airway obstruction for comfort; do not transfer unless for comfort needs.   Consent: Discussion documented in EHR or advanced directives reviewed      08/01/22 0705           Code Status History     Date Active Date Inactive Code Status Order ID Comments User Context   07/31/2022 1507 08/01/2022 0705 Full Code [id number]  Tim Lair, PA-C ED   04/11/2020 2231 04/15/2020 1809 Full Code [id number]  Briscoe Deutscher, MD ED   02/09/2020 0012 02/13/2020 1956 Full Code [id number]  Cox, Amy N, DO Inpatient   01/13/2020 1802 01/15/2020 1705 Full Code [id number]  Dorcas Carrow, MD Inpatient   06/12/2013 0804 06/19/2013 1902 Full Code [id number]  Jeralyn Bennett, MD Inpatient       Prognosis:  ?few weeks.   Discharge Planning: Home with Hospice  Care plan was discussed with patient and family.   Thank you for allowing the Palliative Medicine Team to assist in the care of this patient.  Mod MDM.     Greater  than 50%  of this time was spent counseling and coordinating care related to the above assessment and plan.  Loistine Chance, MD  Please contact Palliative Medicine Team phone at [phone] for questions and concerns.

## 2022-08-03 NOTE — Progress Notes (Signed)
I am glad to see that [name] looks better.  I think the fluids helped.  Clearly, the pain medication has helped.  He is on PCA Dilaudid now.  Labs show sodium 129.  Potassium 3.7.  BUN 20 creatinine 0.7.  Calcium 7.6 with an albumin of 2.1.  White cell count 11.  Hemoglobin 9.3.  Platelet count 167,000.  He clearly is going make it out of the hospital.  I do not know this when I saw him on Saturday.  He looks better.  He is going to need to have Hospice at home.  Think with that what will help mostly at home is very to be on a CADD pump with Dilaudid.  This way, he can have the Dilaudid infusion and give himself boluses as needed.  Hospice will help set this up.  I am not sure when he will be going home.  I think that if he does well today and tomorrow, then he can hopefully be able to go home on Wednesday.  I think he might be eating a little bit better.  He has had no fever.  He might need to have a Pleurx catheter.  He is can be evaluated today for this.  He will be interesting to see what the pleural fluid shows that he has sent off on Friday.  On a chest x-ray today, it looks as there is quite a bit of fluid around the right lung.  Currently, his temperature is 100 degrees.  Pulse 95.  Blood pressure 110/80.  His lungs show decreased overall in the right side.  Left side is relatively clear.  Cardiac exam regular rate and rhythm.  He has no murmurs.  Abdomen is soft.  Bowel sounds are decreased.  There is no guarding or rebound tenderness.  Extremities show some 1+ edema in his legs.  Again, [name] has end-stage thyroid cancer.  Hopefully, he will be able to go home before his birthday.  His [birth year] is on [birth year].  Again I think he would benefit from a CADD pump with Dilaudid.  This will really help with the pain.  Will see if he needs the Pleurx catheter today.  I know that he gets incredible care from everybody in the ICU.  I know the family is very impressed with the  compassion of the staff.  Brian Bach, MD  Charmian Muff 6:2

## 2022-08-03 NOTE — Progress Notes (Signed)
[facility] ADULT ICU REPLACEMENT PROTOCOL   The patient does apply for the [facility] Adult ICU Electrolyte Replacment Protocol based on the criteria listed below:   1.Exclusion criteria: TCTS, ECMO, Dialysis, and Myasthenia Gravis patients 2. Is GFR >/= 30 ml/min? Yes.    Patient's GFR today is >60 3. Is SCr </= 2? Yes.   Patient's SCr is 0.70 mg/dL 4. Did SCr increase >/= 0.5 in 24 hours? No. 5.Pt's weight >40kg  Yes.   6. Abnormal electrolyte(s): K  7. Electrolytes replaced per protocol 8.  Call MD STAT for K+ </= 2.5, Phos </= 1, or Mag </= 1 Physician:  Thersa Salt Jolicia Delira 08/03/2022 4:58 AM

## 2022-08-03 NOTE — Progress Notes (Signed)
Referring Physician(s): Hunsucker,M/Ennever,P  Supervising Physician: Marliss Coots  Patient Status:  [facility] - In-pt  Chief Complaint:  Dyspnea, recurrent right pleural effusion, end stage thyroid cancer  Subjective: Pt known to IR team from liver lesion biopsy in 2020, and port a cath placement 2021. He is a 47 yo male with PMH sig for CHF, LLE DVT, HTN, renal stones, hypothyroidism and end stage metastatic  thyroid cancer. He has evidence of disease progression despite chemo on imaging along with recurrent symptomatic rt pleural effusion. He underwent thoracentesis by CCM on 6/15 yielding 1.5 liters fluid. Cytology pending. He will be on home hospice care following dc. Request received from oncology/CCM for right pleurx cath placement. He denies fever,HA,CP, back pain,N/V or bleeding. He does have some dyspnea with exertion, occ cough, some right upper abd discomfort. He is afebrile, tachycardic, BP ok, WBC nl, hgb 9, plts 149k, PT 16.6, INR 1.3, creat nl, pleural fl cx neg. He is on maxipime.    Past Medical History:  Diagnosis Date   Chronic diastolic CHF (congestive heart failure) (HCC) 04/11/2020   DVT, lower extremity, distal, acute, left (HCC) 01/14/2020   Essential hypertension 09/09/2018   Goals of care, counseling/discussion 01/14/2020   History of radiation therapy    Abdomen (liver nodule, portacaval lymph node) 04/15/2020-04/26/2020  Dr Margaretmary Dys   History of radiation therapy    left hip, left ilium  07/16/2020-07/29/2020  Dr Margaretmary Dys   History of radiation therapy    T4-T6, left hip, left ilium  02/24/2021-03/07/2021  Dr Margaretmary Dys   History of radiation therapy    left glenoid fossa  05/29/2021-06/11/2021  Dr Antony Blackbird   Hypotestosteronemia in male 04/04/2021   Kidney stone    Primary cancer of thyroid with metastasis to other site [facility]) 05/04/2018   Rickettsia infection    Past Surgical History:  Procedure Laterality Date   BIOPSY  04/05/2018    Procedure: BIOPSY;  Surgeon: Bernette Redbird, MD;  Location: [facility] ENDOSCOPY;  Service: Endoscopy;;   CHOLECYSTECTOMY     ESOPHAGOGASTRODUODENOSCOPY (EGD) WITH PROPOFOL N/A 04/05/2018   Procedure: ESOPHAGOGASTRODUODENOSCOPY (EGD) WITH PROPOFOL;  Surgeon: Bernette Redbird, MD;  Location: [facility] ENDOSCOPY;  Service: Endoscopy;  Laterality: N/A;   IR IMAGING GUIDED PORT INSERTION  09/13/2019   IR RADIOLOGIST EVAL & MGMT  10/11/2018      Allergies: Dextromethorphan-guaifenesin, Doxycycline, Gadobutrol, Gadolinium derivatives, Guaifenesin, Ibuprofen, Lisinopril, Pantoprazole, Tramadol, Nexavar [sorafenib], Tape, Barium, and Chlorhexidine  Medications: Prior to Admission medications   Medication Sig Start Date End Date Taking? Authorizing Provider  apixaban (ELIQUIS) 5 MG TABS tablet TAKE 1 TABLET (5 MG TOTAL) BY MOUTH 2 (TWO) TIMES DAILY. Patient taking differently: Take 5 mg by mouth 2 (two) times daily. 04/03/22 04/03/23 Yes Ennever, Rose Phi, MD  baclofen (LIORESAL) 10 MG tablet Take 1 tablet (10 mg total) by mouth 4 (four) times daily as needed for muscle spasms. Patient taking differently: Take 10 mg by mouth 4 (four) times daily as needed for muscle spasms (OR HICCUPS). 05/27/22  Yes Ennever, Rose Phi, MD  chlorproMAZINE (THORAZINE) 25 MG tablet Take 1 tablet (25 mg total) by mouth 4 (four) times daily as needed. Patient taking differently: Take 25 mg by mouth 4 (four) times daily as needed for hiccoughs or nausea. 05/25/22  Yes Ennever, Rose Phi, MD  cyclobenzaprine (FLEXERIL) 10 MG tablet Take 1 tablet (10 mg total) by mouth 3 (three) times daily as needed for muscle spasms. Patient taking differently: Take 10 mg by  mouth 3 (three) times daily as needed for muscle spasms (OR HICCUPS). 07/02/22  Yes Ennever, Rose Phi, MD  EPINEPHrine (EPIPEN 2-PAK) 0.3 mg/0.3 mL IJ SOAJ injection USE AS DIRECTED FOR LIFE THREATENING ALLERGIC REACTIONS Patient taking differently: See admin instructions. USE AS DIRECTED FOR LIFE  THREATENING ALLERGIC REACTIONS 09/04/20  Yes Josph Macho, MD  famotidine (PEPCID) 40 MG tablet Take 1 tablet (40 mg total) by mouth 2 (two) times daily. Patient taking differently: Take 40 mg by mouth 2 (two) times daily as needed for heartburn or indigestion. 09/22/21  Yes Josph Macho, MD  HYDROmorphone (DILAUDID) 8 MG tablet Take 1 tablet (8 mg total) by mouth every 4 (four) hours as needed for severe pain. 07/21/22  Yes Josph Macho, MD  Iron-FA-B Cmp-C-Biot-Probiotic (FUSION PLUS) CAPS Take 1 capsule by mouth daily. Patient taking differently: Take 1 capsule by mouth See admin instructions. Take 1 capsule by mouth once a day with food, IF TOLERATED 09/22/21  Yes Erenest Blank, NP  levothyroxine (SYNTHROID) 200 MCG tablet Take 1 tablet (200 mcg total) by mouth daily. Take with 88 mcg tablet for a total dose of 288 mcg/day. Patient taking differently: Take 200 mcg by mouth in the morning. 07/02/22  Yes Ennever, Rose Phi, MD  LORazepam (ATIVAN) 1 MG tablet Take 1 tablet (1 mg total) by mouth every 6 (six) hours as needed for anxiety (for nausea and vomiting). 07/09/22  Yes Josph Macho, MD  methylphenidate (RITALIN) 10 MG tablet Take 1 tablet (10 mg total) by mouth 2 (two) times daily. Patient taking differently: Take 10 mg by mouth in the morning. 04/03/22  Yes Ennever, Rose Phi, MD  metoCLOPramide (REGLAN) 10 MG tablet Take 1 tablet (10 mg total) by mouth every 6 (six) hours as needed for nausea or vomiting. 03/09/22  Yes Ennever, Rose Phi, MD  metoprolol tartrate (LOPRESSOR) 50 MG tablet Take 1 tablet (50 mg total) by mouth 2 (two) times daily. 04/03/22  Yes Josph Macho, MD  morphine (MS CONTIN) 15 MG 12 hr tablet Take 1 tablet (15 mg total) by mouth every 12 (twelve) hours. 07/23/22  Yes Josph Macho, MD  NONFORMULARY OR COMPOUNDED ITEM Use as directed 15 mLs in the mouth or throat See admin instructions. Magic Mouthwash W/Maalox- SWISH AND EXPECTORATE 15 ML (THREE TEASPOONSFUL) BY  MOUTH EVERY 4 HOURS AS NEEDED FOR MOUTH PAIN   Yes [provider]  polyethylene glycol powder (GLYCOLAX/MIRALAX) 17 GM/SCOOP powder Mix 17 grams (1 Capful) in 8 ounces of liquid and drink by mouth daily as needed. Patient taking differently: Take 17 g by mouth daily as needed for mild constipation. 07/22/21  Yes Ennever, Rose Phi, MD  potassium chloride SA (KLOR-CON M) 20 MEQ tablet Take 1 tablet (20 mEq total) by mouth daily. Patient taking differently: Take 10 mEq by mouth daily. 11/06/21  Yes Ennever, Rose Phi, MD  prochlorperazine (COMPAZINE) 10 MG tablet Take 1 tablet (10 mg total) by mouth every 6 (six) hours as needed (Nausea or vomiting). 05/19/22  Yes Josph Macho, MD  promethazine (PHENERGAN) 25 MG tablet Take 1 tablet (25 mg total) by mouth every 6 (six) hours as needed for nausea and vomiting. 05/18/22 05/18/23 Yes Ennever, Rose Phi, MD  senna-docusate (SENOKOT-S) 8.6-50 MG tablet Take 1 tablet by mouth at bedtime as needed for mild constipation. 04/15/20  Yes Noralee Stain, DO  temazepam (RESTORIL) 30 MG capsule Take 1 capsule (30 mg total) by mouth at bedtime as  needed for sleep. Patient taking differently: Take 30 mg by mouth at bedtime as needed for sleep (and hold for sedation). 07/02/22  Yes Josph Macho, MD  diphenhydrAMINE (BENADRYL) 25 mg capsule Take 2 capsules (50 mg total) by mouth once for 1 dose. Patient not taking: Reported on 07/31/2022 02/17/22 07/31/22  Josph Macho, MD  metolazone (ZAROXOLYN) 5 MG tablet Take 5 mg by mouth daily. 12/22/18   [provider]  Multiple Vitamin (MULTIVITAMIN) capsule Take 1 capsule by mouth daily.    [provider]     Vital Signs: BP 127/79 (BP Location: Right Arm)   Pulse (!) 110   Temp 98.3 F (36.8 C) (Oral)   Resp (!) 21   Ht 5\' 10"  (1.778 m)   Wt 235 lb 0.2 oz (106.6 kg)   SpO2 96%   BMI 33.72 kg/m   Physical Exam: awake/alert; chest- dim BS rt base, left clear, left chest wall port in place; heart-  tachy but reg rhythm; abd-soft,+BS, mildly tender RUQ; bilat pretibial edema  Imaging: DG Chest Port 1 View  Result Date: 08/03/2022 CLINICAL DATA:  [id number] with pleural effusion. EXAM: PORTABLE CHEST 1 VIEW COMPARISON:  Portable chest 08/01/2022 FINDINGS: 6:05 a.m. Right IJ port catheter terminates about the superior cavoatrial junction as before. Moderate right pleural effusion is again noted exaggerated by chronic elevation of the right hemidiaphragm. There is overlying atelectasis or consolidation in the hypoinflated right lower lung field. Left lung is clear. Cardiac size is stable. No vascular congestion is seen. Stable mediastinum. Compare: Overall aeration seems unchanged. IMPRESSION: Moderate right pleural effusion with overlying atelectasis or consolidation in the hypoinflated right lower lung field. No significant change from yesterday's study. Electronically Signed   By: Almira Bar M.D.   On: 08/03/2022 07:30   DG Chest Port 1 View  Result Date: 08/01/2022 CLINICAL DATA:  47 year old male with metastatic thyroid carcinoma. Right pleural effusion. EXAM: PORTABLE CHEST 1 VIEW COMPARISON:  Portable chest 07/31/2022 and earlier. Abdomen MRI 07/24/2022. FINDINGS: Portable AP semi upright view at 0611 hours. Similar low lung volumes. Moderate or possibly large right side pleural effusion appears unchanged from yesterday. Stable cardiac size and mediastinal contours. No confluent left lung opacity. Stable left chest Port-A-Cath. Visualized tracheal air column is within normal limits. Paucity of bowel gas in the visible abdomen. Stable visualized osseous structures. IMPRESSION: Low lung volumes with stable moderate or possibly large right pleural effusion. Electronically Signed   By: Odessa Fleming M.D.   On: 08/01/2022 06:45   DG CHEST PORT 1 VIEW  Result Date: 07/31/2022 CLINICAL DATA:  Status post thoracentesis EXAM: PORTABLE CHEST 1 VIEW COMPARISON:  Previous studies including the examination done  earlier today FINDINGS: Transverse diameter of heart is increased. There is interval decrease in size of right pleural effusion. There is no pneumothorax. There are linear densities in right lower lung field suggesting possible atelectasis/pneumonia. No new infiltrates are seen in left lung. Azygous fissure is seen in right upper lung field. Tip of central venous catheter is seen in the region of right atrium. IMPRESSION: There is decrease in amount of right pleural effusion after thoracentesis. There is no pneumothorax. Electronically Signed   By: Ernie Avena M.D.   On: 07/31/2022 17:55   ECHOCARDIOGRAM LIMITED  Result Date: 07/31/2022    ECHOCARDIOGRAM LIMITED REPORT   Patient Name:   NATHON MARPLE [name] Date of Exam: 07/31/2022 Medical Rec #:  [id number]        Height:  70.0 in Accession #:    [id number]       Weight:       240.0 lb Date of Birth:  [birth year]        BSA:          2.255 m Patient Age:    46 years         BP:           98/69 mmHg Patient Gender: M                HR:           107 bpm. Exam Location:  Inpatient Procedure: Limited Echo and Limited Color Doppler Indications:    CHF Acute Systolic  History:        Patient has prior history of Echocardiogram examinations, most                 recent 05/27/2022. CHF; Risk Factors:Hypertension. Thyroid CA, Hx                 of radiation therapy.  Sonographer:    Milda Smart Referring Phys: [id number] JON KNAPP IMPRESSIONS  1. Consider chest CTA to further assess retro atrial structures.  2. Likely prominent descending thoracic aorta behind LA.  3. Left ventricular ejection fraction, by estimation, is 55%. There is mild left ventricular hypertrophy.  4. Right ventricular systolic function is normal. The right ventricular size is normal.  5. No color flow/doppler done.  6. No color flow/doppler done . The aortic valve is tricuspid. No aortic stenosis is present. FINDINGS  Left Ventricle: Left ventricular ejection fraction, by estimation, is 55%.  There is mild left ventricular hypertrophy. Right Ventricle: The right ventricular size is normal. Right vetricular wall thickness was not assessed. Right ventricular systolic function is normal. Pericardium: There is no evidence of pericardial effusion. Mitral Valve: No color flow/doppler done. Aortic Valve: No color flow/doppler done. The aortic valve is tricuspid. No aortic stenosis is present. IAS/Shunts: The interatrial septum was not well visualized. Additional Comments: Likely prominent descending thoracic aorta behind LA. Consider chest CTA to further assess retro atrial structures. Color Doppler performed.  LEFT VENTRICLE PLAX 2D LVIDd:         4.80 cm LVIDs:         3.50 cm LV PW:         1.00 cm LV IVS:        1.20 cm  LEFT ATRIUM             Index LA diam:        2.10 cm 0.93 cm/m LA Vol (A2C):   29.6 ml 13.12 ml/m LA Vol (A4C):   10.5 ml 4.66 ml/m LA Biplane Vol: 18.5 ml 8.20 ml/m Charlton Haws MD Electronically signed by Charlton Haws MD Signature Date/Time: 07/31/2022/4:30:34 PM    Final    CT Head Wo Contrast  Result Date: 07/31/2022 CLINICAL DATA:  Mental status change.  History of cancer. EXAM: CT HEAD WITHOUT CONTRAST TECHNIQUE: Contiguous axial images were obtained from the base of the skull through the vertex without intravenous contrast. RADIATION DOSE REDUCTION: This exam was performed according to the departmental dose-optimization program which includes automated exposure control, adjustment of the mA and/or kV according to patient size and/or use of iterative reconstruction technique. COMPARISON:  MRI brain 02/19/2022 FINDINGS: Brain: No evidence of acute infarction, hemorrhage, hydrocephalus, extra-axial collection or mass lesion/mass effect. Vascular: No hyperdense vessel or unexpected calcification. Skull: Normal. Negative for fracture  or focal lesion. Sinuses/Orbits: No acute finding. Other: None. IMPRESSION: No acute intracranial abnormality. Electronically Signed   By: Darliss Cheney M.D.   On: 07/31/2022 15:22   DG Chest Port 1 View  Result Date: 07/31/2022 CLINICAL DATA:  Questionable sepsis EXAM: PORTABLE CHEST 1 VIEW COMPARISON:  Chest radiograph dated 02/18/2022 FINDINGS: Left chest wall port tip projects over the superior cavoatrial junction. Asymmetrically low right lung volumes. Bilateral interstitial opacities. Moderate right pleural effusion. No pneumothorax. Right heart border is obscured. No acute osseous abnormality. IMPRESSION: 1. Pulmonary edema. 2. Moderate right pleural effusion. Electronically Signed   By: Agustin Cree M.D.   On: 07/31/2022 14:21    Labs:  CBC: Recent Labs    07/23/22 1349 07/31/22 1249 08/01/22 0507 08/03/22 0321  WBC 16.6* 9.4 8.5 11.0*  HGB 10.0* 9.3* 9.3* 9.3*  HCT 32.2* 30.4* 29.9* 30.4*  PLT 415* 161 168 167    COAGS: Recent Labs    07/31/22 1249  INR 2.2*  APTT 58*    BMP: Recent Labs    07/31/22 1249 08/01/22 0507 08/02/22 0500 08/03/22 0321  NA 131* 135 130* 129*  K 3.2* 3.3* 3.6 3.7  CL 90* 95* 94* 92*  CO2 28 26 26 27   GLUCOSE 106* 107* 127* 110*  BUN 29* 27* 22* 20  CALCIUM 8.2* 7.8* 7.5* 7.6*  CREATININE 0.82 0.82 0.74 0.70  GFRNONAA >60 >60 >60 >60    LIVER FUNCTION TESTS: Recent Labs    07/22/22 0810 07/23/22 1349 07/31/22 1249 07/31/22 1714 08/01/22 0507  BILITOT 1.4* 0.8 2.4*  --  2.6*  AST 35 41 35  --  38  ALT 13 15 24   --  21  ALKPHOS 531* 561* 566*  --  517*  PROT 6.7 6.9 6.1* 5.7* 5.6*  ALBUMIN 3.8 3.9 2.6*  --  2.1*    Assessment and Plan: 47 yo male with PMH sig for CHF, LLE DVT, HTN, renal stones, hypothyroidism and end stage metastatic  thyroid cancer. He has evidence of disease progression despite chemo on imaging along with recurrent symptomatic rt pleural effusion. He underwent thoracentesis by CCM on 6/15 yielding 1.5 liters fluid. Cytology pending. He will be on home hospice care following dc. Request received from oncology/CCM for right pleurx cath placement.  He denies fever,HA,CP, back pain,N/V or bleeding. He does have some dyspnea with exertion, occ cough, some right upper abd discomfort. He is afebrile, tachycardic, BP ok, WBC nl, hgb 9, plts 149k, PT 16.6, INR 1.3, creat nl, pleural fl cx neg. He is on maxipime. Imaging studies have been reviewed by Dr. Elby Showers. Risks and benefits discussed with the patient /spouse including bleeding, infection, damage to adjacent structures,pneumothorax.All of the patient's questions were answered, patient is agreeable to proceed. Consent signed and in chart. Procedure scheduled for today.     Electronically Signed: D. Jeananne Rama, PA-C 08/03/2022, 8:47 PM   I spent a total of 25 Minutes at the the patient's bedside AND on the patient's hospital floor or unit, greater than 50% of which was counseling/coordinating care for image guided right pleurx catheter placement    Patient ID: Brian [name], male   DOB: 02-19-1975, 47 y.o.   MRN: [id number]

## 2022-08-04 ENCOUNTER — Inpatient Hospital Stay (HOSPITAL_COMMUNITY): Payer: BC Managed Care – PPO

## 2022-08-04 DIAGNOSIS — C73 Malignant neoplasm of thyroid gland: Secondary | ICD-10-CM

## 2022-08-04 DIAGNOSIS — A419 Sepsis, unspecified organism: Secondary | ICD-10-CM | POA: Diagnosis not present

## 2022-08-04 DIAGNOSIS — Z79899 Other long term (current) drug therapy: Secondary | ICD-10-CM

## 2022-08-04 DIAGNOSIS — C799 Secondary malignant neoplasm of unspecified site: Secondary | ICD-10-CM

## 2022-08-04 DIAGNOSIS — K5900 Constipation, unspecified: Secondary | ICD-10-CM

## 2022-08-04 DIAGNOSIS — E871 Hypo-osmolality and hyponatremia: Secondary | ICD-10-CM

## 2022-08-04 DIAGNOSIS — G893 Neoplasm related pain (acute) (chronic): Secondary | ICD-10-CM

## 2022-08-04 HISTORY — PX: IR PERC PLEURAL DRAIN W/INDWELL CATH W/IMG GUIDE: IMG5383

## 2022-08-04 LAB — COMPREHENSIVE METABOLIC PANEL
ALT: 23 U/L (ref 0–44)
AST: 46 U/L — ABNORMAL HIGH (ref 15–41)
Albumin: 2.3 g/dL — ABNORMAL LOW (ref 3.5–5.0)
Alkaline Phosphatase: 828 U/L — ABNORMAL HIGH (ref 38–126)
Anion gap: 8 (ref 5–15)
BUN: 16 mg/dL (ref 6–20)
CO2: 27 mmol/L (ref 22–32)
Calcium: 7.3 mg/dL — ABNORMAL LOW (ref 8.9–10.3)
Chloride: 93 mmol/L — ABNORMAL LOW (ref 98–111)
Creatinine, Ser: 0.64 mg/dL (ref 0.61–1.24)
GFR, Estimated: >60 mL/min (ref >60)
Glucose, Bld: 115 mg/dL — ABNORMAL HIGH (ref 70–99)
Potassium: 3.6 mmol/L (ref 3.5–5.1)
Sodium: 128 mmol/L — ABNORMAL LOW (ref 135–145)
Total Bilirubin: 2.3 mg/dL — ABNORMAL HIGH (ref 0.3–1.2)
Total Protein: 5.9 g/dL — ABNORMAL LOW (ref 6.5–8.1)

## 2022-08-04 LAB — CBC WITH DIFFERENTIAL/PLATELET
Abs Immature Granulocytes: 0.07 10*3/uL (ref 0.00–0.07)
Basophils Absolute: 0 10*3/uL (ref 0.0–0.1)
Basophils Relative: 0 %
Eosinophils Absolute: 0.1 10*3/uL (ref 0.0–0.5)
Eosinophils Relative: 1 %
HCT: 29.8 % — ABNORMAL LOW (ref 39.0–52.0)
Hemoglobin: 9 g/dL — ABNORMAL LOW (ref 13.0–17.0)
Immature Granulocytes: 1 %
Lymphocytes Relative: 4 %
Lymphs Abs: 0.5 10*3/uL — ABNORMAL LOW (ref 0.7–4.0)
MCH: 28.8 pg (ref 26.0–34.0)
MCHC: 30.2 g/dL (ref 30.0–36.0)
MCV: 95.2 fL (ref 80.0–100.0)
Monocytes Absolute: 1 10*3/uL (ref 0.1–1.0)
Monocytes Relative: 10 %
Neutro Abs: 8.7 10*3/uL — ABNORMAL HIGH (ref 1.7–7.7)
Neutrophils Relative %: 84 %
Platelets: 149 10*3/uL — ABNORMAL LOW (ref 150–400)
RBC: 3.13 MIL/uL — ABNORMAL LOW (ref 4.22–5.81)
RDW: 19.3 % — ABNORMAL HIGH (ref 11.5–15.5)
WBC: 10.3 10*3/uL (ref 4.0–10.5)
nRBC: 0 % (ref 0.0–0.2)

## 2022-08-04 LAB — BODY FLUID CULTURE W GRAM STAIN

## 2022-08-04 LAB — CYTOLOGY - NON PAP

## 2022-08-04 LAB — PROTIME-INR
INR: 1.3 — ABNORMAL HIGH (ref 0.8–1.2)
Prothrombin Time: 16.6 seconds — ABNORMAL HIGH (ref 11.4–15.2)

## 2022-08-04 LAB — MAGNESIUM: Magnesium: 2.1 mg/dL (ref 1.7–2.4)

## 2022-08-04 MED ORDER — FENTANYL CITRATE (PF) 100 MCG/2ML IJ SOLN
INTRAMUSCULAR | Status: AC
  Filled 2022-08-04: qty 2

## 2022-08-04 MED ORDER — SENNOSIDES-DOCUSATE SODIUM 8.6-50 MG PO TABS
1.0000 | ORAL_TABLET | Freq: Two times a day (BID) | ORAL | Status: DC
  Administered 2022-08-04 – 2022-08-05 (×2): 1 via ORAL
  Filled 2022-08-04 (×2): qty 1

## 2022-08-04 MED ORDER — LIDOCAINE-EPINEPHRINE 1 %-1:100000 IJ SOLN
20.0000 mL | Freq: Once | INTRAMUSCULAR | Status: DC
  Filled 2022-08-04: qty 20

## 2022-08-04 MED ORDER — HYDROMORPHONE HCL 1 MG/ML IJ SOLN
INTRAMUSCULAR | Status: AC
  Filled 2022-08-04: qty 1

## 2022-08-04 MED ORDER — FENTANYL CITRATE (PF) 100 MCG/2ML IJ SOLN
INTRAMUSCULAR | Status: AC | PRN
  Administered 2022-08-04 (×2): 50 ug via INTRAVENOUS

## 2022-08-04 MED ORDER — MIDAZOLAM HCL 2 MG/2ML IJ SOLN
INTRAMUSCULAR | Status: AC | PRN
  Administered 2022-08-04 (×2): 1 mg via INTRAVENOUS

## 2022-08-04 MED ORDER — HYDROMORPHONE 1 MG/ML IV SOLN: INTRAVENOUS | 0 refills | Status: DC

## 2022-08-04 MED ORDER — HYDROMORPHONE BOLUS VIA INFUSION
1.0000 mg | INTRAVENOUS | Status: DC | PRN
  Administered 2022-08-04: 1 mg via INTRAVENOUS

## 2022-08-04 MED ORDER — LIDOCAINE-EPINEPHRINE 1 %-1:100000 IJ SOLN
INTRAMUSCULAR | Status: AC
  Filled 2022-08-04: qty 1

## 2022-08-04 MED ORDER — BISACODYL 10 MG RE SUPP
10.0000 mg | Freq: Once | RECTAL | Status: AC
  Administered 2022-08-04: 10 mg via RECTAL
  Filled 2022-08-04: qty 1

## 2022-08-04 MED ORDER — HYDROMORPHONE HCL 1 MG/ML IJ SOLN
1.0000 mg | INTRAMUSCULAR | Status: DC | PRN
  Administered 2022-08-04 – 2022-08-05 (×4): 1 mg via INTRAVENOUS

## 2022-08-04 MED ORDER — MIDAZOLAM HCL 2 MG/2ML IJ SOLN
INTRAMUSCULAR | Status: AC
  Filled 2022-08-04: qty 2

## 2022-08-04 MED ORDER — HYDROMORPHONE HCL 1 MG/ML IJ SOLN
INTRAMUSCULAR | Status: AC | PRN
  Administered 2022-08-04: 1 mg via INTRAVENOUS

## 2022-08-04 NOTE — Progress Notes (Signed)
[name] is doing a little better this morning.  He had a decent day yesterday.  He will have a Pleurx catheter placed today.  The PCA really has helped his pain.  We can do this at home.  I am sure that Hospice will be able to manage this.  Hopefully, he will be able to go home tomorrow.  His appetite is doing a little bit better.  He has had no nausea or vomiting.  He has had no cough or shortness of breath.  He has had no bowel movement yet.  His CBC shows white count 10.3.  Hemoglobin 9.  Blood count 149,000.  Sodium 128.  Potassium 3.6.  BUN 16 creatinine 0.64.  Calcium 7.3 with an albumin of 2.3.  Again, I want him to have respect, and dignity and comfort.  I think we doing a good job.  Apparently he had some episodes of V. tach yesterday.  He is on amiodarone.  He is back on metoprolol.  He has had no fever.  His temperature is 98.  Pulse is 118.  Blood pressure 115/63.  His lungs may has decreased over on the right side.  Left side is clear.  Cardiac exam tachycardic but regular.  Abdomen soft.  Bowel sounds are present.  Extremity shows no clubbing, cyanosis or edema.  Again, [name] will have a Pleurx catheter placed.  Will then hopefully be able to plan on him going home tomorrow or Thursday.  I can get the CADD set up for him at home.  I know he is getting great care from everybody in the ICU.  It would be nice to get home by Thursday which will be as birthday.  Christin Bach, MD  Jeri Modena 33:6

## 2022-08-04 NOTE — Progress Notes (Signed)
PROGRESS NOTE    STEPHANE AUKERMAN [name]  YNW:[id number] DOB: 27-Dec-1975 DOA: 07/31/2022 PCP: Koren Bound, MD    Brief Narrative:  47 yo with metastatic thyroid cancer with progression had cough, low grade temperature an change in mental status with increased lethargy. Was recently started on new pain medication. Recent PET scan and MRI liver which showed progression of cancer and new right pleural effusion.plan home with hospice on 6.19.     Assessment and Plan: Cough, low grade temperature elevation and concern for sepsis. -cultures negative  New Rt exudate pleural effusion. - follow up pleural fluid cytology from 6/14 - might need to consider repeat therapeutic thoracentesis versus pleurx catheter placement- family to decide with PCCM guidance    Wide complex tachycardia. Acute pulmonary edema. Hx of HTN. - received amiodarone bolus in ER; defer additional amiodarone - normal EF on Echo - monitor on telemetry - continue lopressor    Hypokalemia. Hypomagnesemia. 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 Insomnia. - appreciated palliative team assistance with pain control regimen - continue dilaudid PCA - prn baclofen, thorazine for hiccoughs - prn compazine for nausea - prn flexeril for muscle spasms - prn restoril for sleep   Hx of PE, Lt leg DVT. - hold eliquis for now since he might need additional pleural procedure   Anemia of critical illness and chronic disease. - follow up CBC   Constipation. - adjust bowel regimen   Goals of care. - DNR/DNI - palliative care consulted to assist with symptom management and help arrange for hospice care - he would like to arrange for home hospice if possible--  tentative plan for wednesday        DVT prophylaxis: SCDs Start: 07/31/22 1504    Code Status: DNR Family Communication: at bedside  Disposition Plan:  Level of care: ICU Status is: Inpatient Remains inpatient appropriate     Consultants:  Oncology PCCM Palliative care   Subjective: Up since 4am  Objective: Vitals:   08/04/22 0817 08/04/22 0855 08/04/22 0900 08/04/22 1000  BP:   112/76 123/74  Pulse:   (!) 116 (!) 117  Resp: (!) 21 (!) 28 (!) 21 18  Temp:      TempSrc:      SpO2: 94% 92% 94% 95%  Weight:      Height:        Intake/Output Summary (Last 24 hours) at 08/04/2022 1118 Last data filed at 08/04/2022 0629 Gross per 24 hour  Intake 477.96 ml  Output 875 ml  Net -397.04 ml   Filed Weights   08/01/22 0500 08/02/22 0705 08/04/22 0716  Weight: 106.5 kg 106.6 kg 108.4 kg    Examination:   General: Appearance:    Obese male in no acute distress            Data Reviewed: I have personally reviewed following labs and imaging studies  CBC: Recent Labs  Lab 07/31/22 1249 08/01/22 0507 08/03/22 0321 08/04/22 0547  WBC 9.4 8.5 11.0* 10.3  NEUTROABS 8.0*  --   --  8.7*  HGB 9.3* 9.3* 9.3* 9.0*  HCT 30.4* 29.9* 30.4* 29.8*  MCV 93.0 93.1 95.0 95.2  PLT 161 168 167 149*   Basic Metabolic Panel: Recent Labs  Lab  07/31/22 1249 08/01/22 0507 08/02/22 0500 08/03/22 0321 08/04/22 0547  NA 131* 135 130* 129* 128*  K 3.2* 3.3* 3.6 3.7 3.6  CL 90* 95* 94* 92* 93*  CO2 28 26 26 27 27   GLUCOSE 106* 107* 127* 110* 115*  BUN 29* 27* 22* 20 16  CREATININE 0.82 0.82 0.74 0.70 0.64  CALCIUM 8.2* 7.8* 7.5* 7.6* 7.3*  MG 1.6* 1.7 1.9 2.0 2.1  PHOS  --  3.9  --   --   --    GFR: Estimated Creatinine Clearance: 142.3 mL/min (by C-G formula based on SCr of 0.64 mg/dL). Liver Function Tests: Recent Labs  Lab 07/31/22 1249 07/31/22 1714 08/01/22 0507 08/04/22 0547  AST 35  --  38 46*  ALT 24  --  21 23  ALKPHOS 566*  --  517* 828*  BILITOT  2.4*  --  2.6* 2.3*  PROT 6.1* 5.7* 5.6* 5.9*  ALBUMIN 2.6*  --  2.1* 2.3*   No results for input(s): "LIPASE", "AMYLASE" in the last 168 hours. Recent Labs  Lab 07/31/22 1507  AMMONIA 28   Coagulation Profile: Recent Labs  Lab 07/31/22 1249 08/04/22 0547  INR 2.2* 1.3*   Cardiac Enzymes: No results for input(s): "CKTOTAL", "CKMB", "CKMBINDEX", "TROPONINI" in the last 168 hours. BNP (last 3 results) No results for input(s): "PROBNP" in the last 8760 hours. HbA1C: No results for input(s): "HGBA1C" in the last 72 hours. CBG: Recent Labs  Lab 08/01/22 2001 08/02/22 0004 08/02/22 0351 08/02/22 0821 08/02/22 2010  GLUCAP 117* 118* 111* 102* 118*   Lipid Profile: No results for input(s): "CHOL", "HDL", "LDLCALC", "TRIG", "CHOLHDL", "LDLDIRECT" in the last 72 hours. Thyroid Function Tests: No results for input(s): "TSH", "T4TOTAL", "FREET4", "T3FREE", "THYROIDAB" in the last 72 hours.  Anemia Panel: No results for input(s): "VITAMINB12", "FOLATE", "FERRITIN", "TIBC", "IRON", "RETICCTPCT" in the last 72 hours. Sepsis Labs: Recent Labs  Lab 07/31/22 1249 07/31/22 1507  PROCALCITON  --  3.21  LATICACIDVEN 2.9* 2.4*    Recent Results (from the past 240 hour(s))  Resp panel by RT-PCR (RSV, Flu A&B, Covid) Anterior Nasal Swab     Status: None   Collection Time: 07/31/22  1:08 PM   Specimen: Anterior Nasal Swab  Result Value Ref Range Status   SARS Coronavirus 2 by RT PCR NEGATIVE NEGATIVE Final    Comment: (NOTE) SARS-CoV-2 target nucleic acids are NOT DETECTED.  The SARS-CoV-2 RNA is generally detectable in upper respiratory specimens during the acute phase of infection. The lowest concentration of SARS-CoV-2 viral copies this assay can detect is 138 copies/mL. A negative result does not preclude SARS-Cov-2 infection and should not be used as the sole basis for treatment or other patient management decisions. A negative result may occur with  improper specimen  collection/handling, submission of specimen other than nasopharyngeal swab, presence of viral mutation(s) within the areas targeted by this assay, and inadequate number of viral copies(<138 copies/mL). A negative result must be combined with clinical observations, patient history, and epidemiological information. The expected result is Negative.  Fact Sheet for Patients:  BloggerCourse.com  Fact Sheet for Healthcare Providers:  SeriousBroker.it  This test is no t yet approved or cleared by the [place] FDA and  has been authorized for detection and/or diagnosis of SARS-CoV-2 by FDA under an Emergency Use Authorization (EUA). This EUA will remain  in effect (meaning this test can be used) for the duration of the COVID-19 declaration under Section 564(b)(1) of the Act, 21  U.S.C.section 360bbb-3(b)(1), unless the authorization is terminated  or revoked sooner.       Influenza A by PCR NEGATIVE NEGATIVE Final   Influenza B by PCR NEGATIVE NEGATIVE Final    Comment: (NOTE) The Xpert Xpress SARS-CoV-2/FLU/RSV plus assay is intended as an aid in the diagnosis of influenza from Nasopharyngeal swab specimens and should not be used as a sole basis for treatment. Nasal washings and aspirates are unacceptable for Xpert Xpress SARS-CoV-2/FLU/RSV testing.  Fact Sheet for Patients: BloggerCourse.com  Fact Sheet for Healthcare Providers: SeriousBroker.it  This test is not yet approved or cleared by the [place] FDA and has been authorized for detection and/or diagnosis of SARS-CoV-2 by FDA under an Emergency Use Authorization (EUA). This EUA will remain in effect (meaning this test can be used) for the duration of the COVID-19 declaration under Section 564(b)(1) of the Act, 21 U.S.C. section 360bbb-3(b)(1), unless the authorization is terminated or revoked.     Resp Syncytial  Virus by PCR NEGATIVE NEGATIVE Final    Comment: (NOTE) Fact Sheet for Patients: BloggerCourse.com  Fact Sheet for Healthcare Providers: SeriousBroker.it  This test is not yet approved or cleared by the [place] FDA and has been authorized for detection and/or diagnosis of SARS-CoV-2 by FDA under an Emergency Use Authorization (EUA). This EUA will remain in effect (meaning this test can be used) for the duration of the COVID-19 declaration under Section 564(b)(1) of the Act, 21 U.S.C. section 360bbb-3(b)(1), unless the authorization is terminated or revoked.  Performed at [facility], [address], [place], [place] [address]   Blood Culture (routine x 2)     Status: None (Preliminary result)   Collection Time: 07/31/22  1:24 PM   Specimen: BLOOD  Result Value Ref Range Status   Specimen Description   Final    BLOOD PORTA CATH Performed at [facility], [address], [place], [place] [address]    Special Requests   Final    BOTTLES DRAWN AEROBIC AND ANAEROBIC Blood Culture adequate volume Performed at [facility], [address] [place], [place] [address]    Culture   Final    NO GROWTH 4 DAYS Performed at [facility], [address], [place], [place] [address]    Report Status PENDING  Incomplete  Blood Culture (routine x 2)     Status: None (Preliminary result)   Collection Time: 07/31/22  1:41 PM   Specimen: BLOOD  Result Value Ref Range Status   Specimen Description   Final    BLOOD LEFT ANTECUBITAL Performed at [facility], [address], [place], [place] [address]    Special Requests   Final    BOTTLES DRAWN AEROBIC AND ANAEROBIC Blood Culture adequate volume Performed at [facility], [address], [place], [place] [address]    Culture   Final    NO GROWTH 4 DAYS Performed at [facility], [address],  [place], [place] [address]    Report Status PENDING  Incomplete  MRSA Next Gen by PCR, Nasal     Status: None   Collection Time: 07/31/22  5:58 PM   Specimen: Nasal Mucosa; Nasal Swab  Result Value Ref Range Status   MRSA by PCR Next Gen NOT DETECTED NOT DETECTED Final    Comment: (NOTE) The GeneXpert MRSA Assay (FDA approved for NASAL specimens only), is one component of a comprehensive MRSA colonization surveillance program. It is  not intended to diagnose MRSA infection nor to guide or monitor treatment for MRSA infections. Test performance is not FDA approved in patients less than 16 years old. Performed at [facility], [address], [place], [place] [address]   Body fluid culture w Gram Stain     Status: None   Collection Time: 07/31/22  5:58 PM   Specimen: Pleural Fluid  Result Value Ref Range Status   Specimen Description   Final    PLEURAL Performed at [facility], [address] [place], [place] [address]    Special Requests   Final    NONE Performed at [facility], [address], [place], [place] [address]    Gram Stain NO WBC SEEN NO ORGANISMS SEEN   Final   Culture   Final    NO GROWTH 3 DAYS Performed at [facility], [address] [place], [place] [address]    Report Status 08/04/2022 FINAL  Final         Radiology Studies: DG Chest Port 1 View  Result Date: 08/03/2022 CLINICAL DATA:  [id number] with pleural effusion. EXAM: PORTABLE CHEST 1 VIEW COMPARISON:  Portable chest 08/01/2022 FINDINGS: 6:05 a.m. Right IJ port catheter terminates about the superior cavoatrial junction as before. Moderate right pleural effusion is again noted exaggerated by chronic elevation of the right hemidiaphragm. There is overlying atelectasis or consolidation in the hypoinflated right lower lung field. Left lung is clear. Cardiac size is stable. No vascular congestion is seen. Stable mediastinum. Compare: Overall  aeration seems unchanged. IMPRESSION: Moderate right pleural effusion with overlying atelectasis or consolidation in the hypoinflated right lower lung field. No significant change from yesterday's study. Electronically Signed   By: Almira Bar M.D.   On: 08/03/2022 07:30        Scheduled Meds:  amiodarone  200 mg Oral Daily   bisacodyl  10 mg Rectal Once   docusate sodium  100 mg Oral BID   HYDROmorphone   Intravenous Q4H   levothyroxine  200 mcg Oral Daily   polyethylene glycol  17 g Oral Daily   senna-docusate  1 tablet Oral BID   sodium chloride flush  10-40 mL Intracatheter Q12H   Continuous Infusions:  ceFEPime (MAXIPIME) IV Stopped (08/04/22 0620)     LOS: 4 days    Time spent: 45 minutes spent on chart review, discussion with nursing staff, consultants, updating family and interview/physical exam; more than 50% of that time was spent in counseling and/or coordination of care.    Joseph Art, DO [facility] Available via Epic secure chat 7am-7pm After these hours, please refer to coverage provider listed on [email or web address] 08/04/2022, 11:18 AM

## 2022-08-04 NOTE — Progress Notes (Signed)
Daily Progress Note   Patient Name: Brian [name]       Date: 08/04/2022 DOB: Nov 06, 1975  Age: 47 y.o. MRN#: [id number] Attending Physician: Brian Art, DO Primary Care Physician: Brian Bound, MD Admit Date: 07/31/2022 Length of Stay: 4 days  Reason for Consultation/Follow-up: Establishing goals of care and Symptom Management  Subjective:   CC: Patient noting pain managed on PCA. Following up with complex medical decision making and symptom management.   Subjective:  Reviewed EMR prior to presenting to bedside.  Plan is for patient to have Pleurx catheter placed today to assist with symptom management before going home with hospice on 6/19.  Presented to bedside to meet with patient.  Patient laying comfortably in bed.  Patient's wife present at bedside during conversation; patient gave permission to discuss care openly while present.  Introduced myself as a member of the palliative medicine team.  Able to follow-up on patient's symptom burden at this time.  Patient continues to feel that current PCA dosing is working to well manage his pain without causing adverse effects such as sedation.  Patient planning for Pleurx catheter placement today.  Patient's only symptom of concern was constipation.  Patient notes he has not had a bowel movement in multiple days.  Discussed providing suppository today with possible enema if needed.  Patient agreeing with interventions to assist with bowel movement at this time.  Also discussed scheduling oral medications to maintain bowel movements moving forward.  All questions answered at that time.  Thanked patient for allowing me to meet with him and his wife today.  Noted palliative medicine team continue to follow along with patient's journey.  Review of Systems Constipation  Objective:   Vital Signs:  BP 131/72 (BP Location: Right Arm)   Pulse (!) 119   Temp 98 F (36.7 C) (Oral)   Resp (!) 21   Ht 5\' 10"  (1.778 m)   Wt 108.4 kg    SpO2 94%   BMI 34.29 kg/m   Physical Exam: General: NAD, alert, laying in bed, chronically ill appearing  Eyes: no drainage noted HENT: moist mucous membranes Cardiovascular: tachycardia noted Respiratory: no increased work of breathing noted, not in respiratory distress Abdomen: distended Neuro: A&Ox4, following commands easily Psych: appropriately answers all questions  Imaging:  I personally reviewed recent imaging.   Assessment & Plan:   Assessment: Patient is a 47 year old with metastatic thyroid cancer, recent PET scan and MRI liver showing progression of cancer new right pleural effusion. Palliative medicine team consulted to assist with complex medical decision making and symptom management.   Recommendations/Plan: # Complex medical decision making/goals of care:  - Plan is for patient to return home with hospice support on 6/19 after Pleurx catheter placed today.   -  Code Status: DNR  # Symptom management:  -Pain, acute on chronic in the setting of metastatic thyroid cancer Discussed with patient does not need to continue on CO2 monitor as is not going to change management.    -Continue IV dilaudid PCA with continuous basal rate of 0.8mg /hr and bolus rate of 0.5mg  q 10 mins.    -Planning for Pleurx catheter placement today.    -Constipation Patient noted has not had a BM in multiple days.    -Give suppository once today. If no relief of constipation, consider enema.    -Continue Miralax 17gm daily   -Change senna to 1 tab BID  # Psychosocial Support:  -wife, father  # Discharge Planning: Home with  Hospice on 6/19  Discussed with: patient, patient's wife, RN, hospitalist  Thank you for allowing the palliative care team to participate in the care Brian [name].  Alvester Morin, DO Palliative Care Provider PMT # [phone]  If patient remains symptomatic despite maximum doses, please call PMT at [phone] between 0700 and 1900. Outside of these hours,  please call attending, as PMT does not have night coverage.  *Please note that this is a verbal dictation therefore any spelling or grammatical errors are due to the "Dragon Medical One" system interpretation.

## 2022-08-04 NOTE — Procedures (Signed)
Interventional Radiology Procedure Note  Procedure: Image guided tunneled right pleural catheter placement  Findings: Please refer to procedural dictation for full description.  Right pleurex, 550 mL drained after placement.  Complications: None immediate  Estimated Blood Loss: < 5 mL  Recommendations: Catheter ready for use as needed.   Marliss Coots, MD

## 2022-08-04 NOTE — TOC Progression Note (Addendum)
Transition of Care [facility]) - Progression Note    Patient Details  Name: Brian [name] MRN: [id number] Date of Birth: [birth year]  Transition of [facility] CM/SW Contact  Lavenia Atlas, RN Phone Number: 08/04/2022, 12:04 PM  Clinical Narrative:   Sherron Monday with Jacki Cones with [facility] regarding plan for discharge w/home hospice tomorrow (08/05/22). This RNCM advised Jacki Cones that [facility] team will e-prescribe PCA and medications once PleurX is placed today in an effort to manage pain appropriately. Jacki Cones will contact this RNCM back once she determines the cut off time for hospice pharmacy to receive PCA prescription. Jacki Cones w/[facility] reports patient will need to be ready by 1pm tomorrow with a bolus of his pain pump due to one hour drive home. Per Jacki Cones w/[facility] DME (home O2, wheelchair, overbed table) will be delivered to patient's home tomorrow 08/05/22 as patient's daughter will be home to receive. Will notify MD, palliative MD.  Transportation: Patient's father will transport via POV   TOC will continue to follow for discharge needs. - 1:28pm This RNCM spoke with Jacki Cones w/[facility] to advise [facility] palliative MD has e-prescribed the discharge PCA and  meds to [facility] pharmacy. Jacki Cones will verify w/ [facility] pharmacy and let this RNCM know if there are any needed changes.  TOC will continue to follow  Expected Discharge Plan: Home w Hospice Care Barriers to Discharge: Continued Medical Work up  Expected Discharge Plan and Services In-house Referral: Hospice / Palliative Care Discharge Planning Services: CM Consult Post Acute Care Choice: Hospice (Abby w/[facility]) Living arrangements for the past 2 months: Single Family Home                 DME Arranged: N/A DME Agency: NA         HH Agency: Other - See comment ([facility] for home hospice) Date HH Agency Contacted: 08/02/22 Time HH Agency Contacted: [phone] Representative spoke with at [facility] Agency: Drue Dun   Social Determinants of  Health (SDOH) Interventions SDOH Screenings   Food Insecurity: No Food Insecurity (08/01/2022)  Housing: Low Risk  (08/01/2022)  Transportation Needs: No Transportation Needs (08/01/2022)  Utilities: Not At Risk (08/01/2022)  Tobacco Use: Low Risk  (07/31/2022)    Readmission Risk Interventions    08/04/2022   12:03 PM 08/02/2022    9:20 AM 02/12/2020   12:37 PM  Readmission Risk Prevention Plan  Transportation Screening Complete Complete Complete  PCP or Specialist Appt within 3-5 Days Complete  Complete  HRI or Home Care Consult Complete  Complete  Social Work Consult for Recovery Care Planning/Counseling Complete  Complete  Palliative Care Screening Complete  Complete  Medication Review Oceanographer) Complete Complete Complete  PCP or Specialist appointment within 3-5 days of discharge  Complete   HRI or Home Care Consult  Complete   SW Recovery Care/Counseling Consult  Complete   Palliative Care Screening  Complete   Skilled Nursing Facility  Not Applicable

## 2022-08-05 DIAGNOSIS — Z79899 Other long term (current) drug therapy: Secondary | ICD-10-CM

## 2022-08-05 DIAGNOSIS — Z7189 Other specified counseling: Secondary | ICD-10-CM

## 2022-08-05 DIAGNOSIS — G893 Neoplasm related pain (acute) (chronic): Secondary | ICD-10-CM | POA: Diagnosis not present

## 2022-08-05 DIAGNOSIS — J90 Pleural effusion, not elsewhere classified: Secondary | ICD-10-CM | POA: Diagnosis not present

## 2022-08-05 DIAGNOSIS — R600 Localized edema: Secondary | ICD-10-CM

## 2022-08-05 DIAGNOSIS — Z66 Do not resuscitate: Secondary | ICD-10-CM

## 2022-08-05 DIAGNOSIS — C73 Malignant neoplasm of thyroid gland: Secondary | ICD-10-CM | POA: Diagnosis not present

## 2022-08-05 LAB — CBC WITH DIFFERENTIAL/PLATELET
Abs Immature Granulocytes: 0.06 10*3/uL (ref 0.00–0.07)
Basophils Absolute: 0 10*3/uL (ref 0.0–0.1)
Basophils Relative: 0 %
Eosinophils Absolute: 0.1 10*3/uL (ref 0.0–0.5)
Eosinophils Relative: 1 %
HCT: 29.3 % — ABNORMAL LOW (ref 39.0–52.0)
Hemoglobin: 8.7 g/dL — ABNORMAL LOW (ref 13.0–17.0)
Immature Granulocytes: 1 %
Lymphocytes Relative: 3 %
Lymphs Abs: 0.4 10*3/uL — ABNORMAL LOW (ref 0.7–4.0)
MCH: 28.3 pg (ref 26.0–34.0)
MCHC: 29.7 g/dL — ABNORMAL LOW (ref 30.0–36.0)
MCV: 95.4 fL (ref 80.0–100.0)
Monocytes Absolute: 1.1 10*3/uL — ABNORMAL HIGH (ref 0.1–1.0)
Monocytes Relative: 10 %
Neutro Abs: 9 10*3/uL — ABNORMAL HIGH (ref 1.7–7.7)
Neutrophils Relative %: 85 %
Platelets: 148 10*3/uL — ABNORMAL LOW (ref 150–400)
RBC: 3.07 MIL/uL — ABNORMAL LOW (ref 4.22–5.81)
RDW: 19.3 % — ABNORMAL HIGH (ref 11.5–15.5)
WBC: 10.6 10*3/uL — ABNORMAL HIGH (ref 4.0–10.5)
nRBC: 0 % (ref 0.0–0.2)

## 2022-08-05 LAB — PREALBUMIN: Prealbumin: <5 mg/dL — ABNORMAL LOW (ref 18–38)

## 2022-08-05 LAB — COMPREHENSIVE METABOLIC PANEL
ALT: 21 U/L (ref 0–44)
AST: 57 U/L — ABNORMAL HIGH (ref 15–41)
Albumin: 2.2 g/dL — ABNORMAL LOW (ref 3.5–5.0)
Alkaline Phosphatase: 869 U/L — ABNORMAL HIGH (ref 38–126)
Anion gap: 9 (ref 5–15)
BUN: 14 mg/dL (ref 6–20)
CO2: 27 mmol/L (ref 22–32)
Calcium: 7.3 mg/dL — ABNORMAL LOW (ref 8.9–10.3)
Chloride: 91 mmol/L — ABNORMAL LOW (ref 98–111)
Creatinine, Ser: 0.71 mg/dL (ref 0.61–1.24)
GFR, Estimated: >60 mL/min (ref >60)
Glucose, Bld: 145 mg/dL — ABNORMAL HIGH (ref 70–99)
Potassium: 3.7 mmol/L (ref 3.5–5.1)
Sodium: 127 mmol/L — ABNORMAL LOW (ref 135–145)
Total Bilirubin: 2.2 mg/dL — ABNORMAL HIGH (ref 0.3–1.2)
Total Protein: 5.8 g/dL — ABNORMAL LOW (ref 6.5–8.1)

## 2022-08-05 LAB — CULTURE, BLOOD (ROUTINE X 2): Culture: NO GROWTH

## 2022-08-05 MED ORDER — POLYETHYLENE GLYCOL 3350 17 G PO PACK: 17.0000 g | PACK | Freq: Every day | ORAL | 2 refills | Status: DC | PRN

## 2022-08-05 MED ORDER — SODIUM CHLORIDE 0.9 % IV SOLN: INTRAVENOUS | Status: DC | PRN

## 2022-08-05 MED ORDER — DOCUSATE SODIUM 100 MG PO CAPS: 100.0000 mg | ORAL_CAPSULE | Freq: Two times a day (BID) | ORAL | 2 refills | Status: AC

## 2022-08-05 MED ORDER — AMIODARONE HCL 200 MG PO TABS: 200.0000 mg | ORAL_TABLET | Freq: Every day | ORAL | 2 refills | Status: AC

## 2022-08-05 NOTE — Progress Notes (Signed)
He had the Pleurx catheter placed yesterday.  I am so impressed and so thankful for IR's help.  He had 550 cc of fluid removed.  Hopefully, he will be able to go home soon.  I really do not think he needs antibiotics.  All of his cultures are negative.  He has had some pain issues.  He is on PCA Dilaudid.  We can arrange for this at home with Hospice.  His appetite is marginal.  I am unsure how much he is really eating.  He did have a couple small bowel movements yesterday.  There is no cough.  He is still very weak.  There is no labs back yet today.  Again, hopefully he will be will be able to go home in a day or so.  The Pleurx catheter will certainly help with the recurrent pleural effusion.  Thankfully, there is also on the first pleural fluid analysis were negative for malignancy.  I know that Hospice will do a good job with him at home.  We just have to get the CADD pump form at home.  Hospice should be able to arrange for this.  I know the staff in the ICU are doing a fantastic job with him.  Christin Bach, MD  Romans 1:16

## 2022-08-05 NOTE — Discharge Summary (Signed)
Physician Discharge Summary  Brian [name]:[id number] DOB: 10-14-1975 DOA: 07/31/2022  PCP: Koren Bound, MD  Admit date: 07/31/2022 Discharge date: 08/05/2022  Admitted From: Home Disposition: Home with hospice  Recommendations for Outpatient Follow-up:  Follow up with medical oncology, Dr. Myna Hidalgo and hospice provider on discharge Discharging home on Dilaudid PCA as follows: IV Dilaudid 0.8 mg/hr basal dose along with 0.5 mg IV every 10 minutes bolus dosing for maximum bolus dose of 5 mg/h.   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 Myna Hidalgo) presented to [facility] ED per recommendation of oncology on 07/31/2022 with altered mental status.  Patient's father reported that he had been disoriented for a few days worsening with increased lower extremity edema, shortness of breath and incontinence.  Also reported fever at home of 100.0 F.  Also with history of recurrent nausea/vomiting, poor oral intake.  His pain has been poorly controlled and has been much more fatigued than usual.  Recent PET scan on 6/7 showing progression of hepatic mets metastatic disease, thoracic nodal metastasis, new right pleural effusion with similar pulmonary metastasis, progression of osseous metastasis.  On arrival, patient was toxic-appearing but afebrile with HR 109, BP 95/69, RR 30, SpO2 100%. Labs were notable for WBC 9.4, Hgb 9.3, Plt 171, INR 2.2 (on Eliquis), Na 131, K 3.2, CO2 28, BUN/Cr 29/0.82, Mg 1.6. Trop 56, BNP 104. TSH 10.7. LA 2.9 > 2.4. UA unremarkable and BCx pending; PCT 3.21 and broad-spectrum antibiotics started.  ABG with pH 7.53/pCO2 40/pO2 72/bicarb 33. CXR with pulmonary edema, moderate R pleural effusion. CT Head NAICA.   PCCM consulted for ICU admission and management.  Significant Hospital events: 6/14 Presented to [facility] ED with AMS. PCCM consulted for ICU admission. R thora completed with 1.5L removed.  Oncology consulted. 6/15: DNR, start dilaudid PCA, palliative care consulted to discuss hospice; recurrence of wide complex tachycardia 6/17: Intermittent wide complex tachycardia overnight, managed with IV beta blocker 6/18: Transferred to hospitalist service; right Pleurx drain placed by interventional radiology 6/19: Discharging home with hospice, on Dilaudid PCA   Hospital course:  Right exudative pleural effusion, recurrent Patient presenting to ED with progressive shortness of breath but new findings of right pleural effusion on imaging.  Patient underwent right thoracentesis on 07/31/2022 with 1.5 L of fluid removed.  Given recurrence of right pleural effusion, IR was consulted for placement of Pleurx catheter per family request.  Pleurx catheter was placed on 6/18.  Continue daily draining of pleural fluid up to a maximum of 1 L/day.  Outpatient follow-up with interventional radiology; medical oncology, hospice services.  Wide-complex tachycardia Acute pulmonary edema History of essential hypertension Patient received amiodarone bolus in the ED, continue amiodarone 2 mg p.o. daily.  Continue metoprolol tartrate twice daily.  Chronic diastolic congestive heart failure Continue metolazone daily  Hypokalemia Hypomagnesemia Repleted.  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 Myna Hidalgo.  Cancer continues to progress despite several different types of chemotherapy regimens and no further therapy options left available.  Continue Synthroid.  Outpatient follow-up with medical oncology.  Pain  of malignancy Patient with  poorly controlled pain outpatient despite narcotic use.  Palliative care was consulted and patient was started on Dilaudid PCA with improvement of symptoms.  Will discharge home on V Dilaudid 0.8 mg/hr basal dose along with 0.5 mg IV every 10 minutes bolus dosing for maximum bolus dose of 5 mg/h.  Continue baclofen, Thorazine, Compazine, Flexeril, Restoril as needed.  History of PE, DVT Continue Eliquis  Anemia of critical illness/chronic medical disease Hemoglobin stable, 9.0 at time of discharge.  Constipation Continue Colace twice daily, MiraLAX daily as needed.  Goals of care Palliative care was consulted and followed during hospital course.  Patient now a DNR.  Discharging on home hospice    Discharge Diagnoses:  Principal Problem:   AMS (altered mental status) Active Problems:   Pleural effusion   Palliative care by specialist   General weakness   Hyponatremia   Constipation   High risk medication use   Metastatic malignant neoplasm (HCC)   Thyroid cancer (HCC)   Cancer associated pain    Discharge Instructions  Discharge Instructions     Ambulatory Pleural Drainage Schedule   Complete by: As directed    Drain daily, up to max of 1L until patient is only able to drain out . If <13ml for 3 consecutive drains every other day, then call Interventional Radiology [phone]) for evaluation and possible removal.   Diet - low sodium heart healthy   Complete by: As directed    Increase activity slowly   Complete by: As directed       Allergies as of 08/05/2022       Reactions   Dextromethorphan-guaifenesin Other (See Comments)   Irregular heartbeat   Doxycycline Anaphylaxis, Nausea And Vomiting, Rash, Other (See Comments)   "heart arrythmia" and "dyspepsia" (only oral doxycycline causes reaction)   Gadobutrol Hives, Other (See Comments)   Patient had MRI scan at [facility]. Patient called one hour after he left imaging  facility to report two "blisters" that came up on "back" of lip.   Gadolinium Derivatives Hives   Patient had MRI scan at [facility]. Patient called one hour after he left imaging facility to report two "blisters" that came up on "back" of lip.    Guaifenesin Palpitations      Ibuprofen Hives, Itching   Lisinopril Other (See Comments)   Angioedema   Pantoprazole Itching   Tramadol Hives, Itching   Nexavar [sorafenib] Hives   Tape Other (See Comments)   If left on for days, this skin gets irritated   Barium Rash   Developed redness around neck after drinking 1st bottle of Barocat; pt was given Benedryl by ED Mds to be "on the safe side" before drinking 2nd bottle   Chlorhexidine Hives        Medication List     STOP taking these medications    diphenhydrAMINE 25 mg capsule Commonly known as: BENADRYL   HYDROmorphone 8 MG tablet Commonly known as: DILAUDID   morphine 15 MG 12 hr tablet Commonly known as: MS CONTIN   PEG 3350 17 GM/SCOOP Powd Replaced by: polyethylene glycol 17 g packet   potassium chloride SA 20 MEQ tablet Commonly known as: KLOR-CON M   senna-docusate 8.6-50 MG tablet Commonly known as: Senokot-S       TAKE these medications    amiodarone 200 MG tablet Commonly known as: PACERONE Take 1 tablet (200 mg total) by mouth daily. Start taking on: August 06, 2022   baclofen 10 MG tablet Commonly known as:  LIORESAL Take 1 tablet (10 mg total) by mouth 4 (four) times daily as needed for muscle spasms. What changed: reasons to take this   chlorproMAZINE 25 MG tablet Commonly known as: THORAZINE Take 1 tablet (25 mg total) by mouth 4 (four) times daily as needed. What changed: reasons to take this   cyclobenzaprine 10 MG tablet Commonly known as: FLEXERIL Take 1 tablet (10 mg total) by mouth 3 (three) times daily as needed for muscle spasms. What changed: reasons to take this   docusate sodium 100 MG capsule Commonly known as:  COLACE Take 1 capsule (100 mg total) by mouth 2 (two) times daily.   Eliquis 5 MG Tabs tablet Generic drug: apixaban TAKE 1 TABLET (5 MG TOTAL) BY MOUTH 2 (TWO) TIMES DAILY. What changed: how much to take   EPINEPHrine 0.3 mg/0.3 mL Soaj injection Commonly known as: EpiPen 2-Pak USE AS DIRECTED FOR LIFE THREATENING ALLERGIC REACTIONS What changed: when to take this   famotidine 40 MG tablet Commonly known as: PEPCID Take 1 tablet (40 mg total) by mouth 2 (two) times daily. What changed:  when to take this reasons to take this   Fusion Plus Caps Take 1 capsule by mouth daily. What changed:  when to take this additional instructions   HYDROmorphone 1 mg/mL injection Commonly known as: DILAUDID Patient is going home with hospice care. Patient to receive IV dilaudid 0.8mg /hr basal dose along with 0.5mg  every 10 minutes bolus dosing. Maximum total bolus dosing per hour is 5 mg.   levothyroxine 200 MCG tablet Commonly known as: SYNTHROID Take 1 tablet (200 mcg total) by mouth daily. Take with 88 mcg tablet for a total dose of 288 mcg/day. What changed: when to take this   LORazepam 1 MG tablet Commonly known as: ATIVAN Take 1 tablet (1 mg total) by mouth every 6 (six) hours as needed for anxiety (for nausea and vomiting).   methylphenidate 10 MG tablet Commonly known as: RITALIN Take 1 tablet (10 mg total) by mouth 2 (two) times daily. What changed: when to take this   metoCLOPramide 10 MG tablet Commonly known as: REGLAN Take 1 tablet (10 mg total) by mouth every 6 (six) hours as needed for nausea or vomiting.   metolazone 5 MG tablet Commonly known as: ZAROXOLYN Take 5 mg by mouth daily.   metoprolol tartrate 50 MG tablet Commonly known as: Lopressor Take 1 tablet (50 mg total) by mouth 2 (two) times daily.   multivitamin capsule Take 1 capsule by mouth daily.   NONFORMULARY OR COMPOUNDED ITEM Use as directed 15 mLs in the mouth or throat See admin  instructions. Magic Mouthwash W/Maalox- SWISH AND EXPECTORATE 15 ML (THREE TEASPOONSFUL) BY MOUTH EVERY 4 HOURS AS NEEDED FOR MOUTH PAIN   polyethylene glycol 17 g packet Commonly known as: MIRALAX / GLYCOLAX Take 17 g by mouth daily as needed for mild constipation. Replaces: PEG 3350 17 GM/SCOOP Powd   prochlorperazine 10 MG tablet Commonly known as: COMPAZINE Take 1 tablet (10 mg total) by mouth every 6 (six) hours as needed (Nausea or vomiting).   promethazine 25 MG tablet Commonly known as: PHENERGAN Take 1 tablet (25 mg total) by mouth every 6 (six) hours as needed for nausea and vomiting.   temazepam 30 MG capsule Commonly known as: RESTORIL Take 1 capsule (30 mg total) by mouth at bedtime as needed for sleep. What changed: reasons to take this        Allergies  Allergen Reactions  Dextromethorphan-Guaifenesin Other (See Comments)    Irregular heartbeat    Doxycycline Anaphylaxis, Nausea And Vomiting, Rash and Other (See Comments)    "heart arrythmia" and "dyspepsia" (only oral doxycycline causes reaction)   Gadobutrol Hives and Other (See Comments)    Patient had MRI scan at [facility]. Patient called one hour after he left imaging facility to report two "blisters" that came up on "back" of lip.   Gadolinium Derivatives Hives    Patient had MRI scan at [facility]. Patient called one hour after he left imaging facility to report two "blisters" that came up on "back" of lip.    Guaifenesin Palpitations        Ibuprofen Hives and Itching   Lisinopril Other (See Comments)    Angioedema   Pantoprazole Itching   Tramadol Hives and Itching   Nexavar [Sorafenib] Hives   Tape Other (See Comments)    If left on for days, this skin gets irritated   Barium Rash    Developed redness around neck after drinking 1st bottle of Barocat; pt was given Benedryl by ED Mds to be "on the safe side" before drinking 2nd bottle   Chlorhexidine Hives     Consultations: PCCM Medical oncology, Dr. Myna Hidalgo Palliative care Interventional radiology   Procedures/Studies: IR [facility] PLEURAL DRAIN W/INDWELL CATH W/IMG GUIDE  Result Date: 08/04/2022 CLINICAL DATA:  47 year old male with history of metastatic thyroid cancer and malignant right pleural effusion. EXAM: INSERTION OF TUNNELED right SIDED PLEURAL DRAINAGE CATHETER COMPARISON:  None Available. MEDICATIONS: Ancef 2 gm IV; Antibiotic was administered in an appropriate time interval for the procedure. ANESTHESIA/SEDATION: Moderate (conscious) sedation was employed during this procedure. A total of Versed 2 mg and dilaudid 1 mg was administered intravenously. Moderate Sedation Time: 15 minutes. The patient's level of consciousness and vital signs were monitored continuously by radiology nursing throughout the procedure under my direct supervision. FLUOROSCOPY TIME:  FLUOROSCOPY TIME One mGy COMPLICATIONS: None immediate. PROCEDURE: The procedure, risks, benefits, and alternatives were explained to the patient, who wishes to proceed with the placement of this permanent pleural catheter as they are seeking palliative care. The patient understands and consents to the procedure. The right lateral chest and upper abdomen were prepped and draped in a sterile fashion, and a sterile drape was applied covering the operative field. A sterile gown and sterile gloves were used for the procedure. Initial ultrasound scanning and fluoroscopic imaging demonstrates a recurrent moderate to large pleural effusion. Under direct ultrasound guidance, the inferior lateral pleural space was accessed with a Yueh sheath needle after the overlying soft tissues were anesthetized with 1% lidocaine with epinephrine. An Amplatz super stiff wire was then advanced under fluoroscopy into the pleural space. A 15.5 French tunneled Pleur-X catheter was tunneled from an incision within the right upper abdominal quadrant to the access site. The  pleural access site was serially dilated under fluoroscopy, ultimately allowing placement of a peel-away sheath. The catheter was advanced through the peel-away sheath. The sheath was then removed. Final catheter positioning was confirmed with a fluoroscopic radiographic image. The access incision was closed with an interrupted subcutaneous subcuticular 3-0 Vicryl and Dermabond. A 0 silk retention suture was applied at the catheter exit site. Large volume thoracentesis was performed through the new catheter utilizing provided bulb vacuum assisted drainage bag. Dressings were applied. The patient tolerated the above procedure well without immediate postprocedural complication. FINDINGS: Preprocedural ultrasound scanning demonstrates a recurrent large sized right sided pleural effusion. After ultrasound and fluoroscopic  guided placement, the catheter is directed the subpleural aspect of the right pleural space. Following catheter placement, approximately 550 cc of translucent, amber colored pleural fluid was removed. IMPRESSION: Successful placement of permanent, tunneled right pleural drainage catheter via lateral approach. Five 5 liters of translucent, amber colored pleural fluid was removed following catheter placement. Marliss Coots, MD [facility] Electronically Signed   By: Marliss Coots M.D.   On: 08/04/2022 16:32   DG Chest Port 1 View  Result Date: 08/03/2022 CLINICAL DATA:  [id number] with pleural effusion. EXAM: PORTABLE CHEST 1 VIEW COMPARISON:  Portable chest 08/01/2022 FINDINGS: 6:05 a.m. Right IJ port catheter terminates about the superior cavoatrial junction as before. Moderate right pleural effusion is again noted exaggerated by chronic elevation of the right hemidiaphragm. There is overlying atelectasis or consolidation in the hypoinflated right lower lung field. Left lung is clear. Cardiac size is stable. No vascular congestion is seen.  Stable mediastinum. Compare: Overall aeration seems unchanged. IMPRESSION: Moderate right pleural effusion with overlying atelectasis or consolidation in the hypoinflated right lower lung field. No significant change from yesterday's study. Electronically Signed   By: Almira Bar M.D.   On: 08/03/2022 07:30   DG Chest Port 1 View  Result Date: 08/01/2022 CLINICAL DATA:  47 year old male with metastatic thyroid carcinoma. Right pleural effusion. EXAM: PORTABLE CHEST 1 VIEW COMPARISON:  Portable chest 07/31/2022 and earlier. Abdomen MRI 07/24/2022. FINDINGS: Portable AP semi upright view at 0611 hours. Similar low lung volumes. Moderate or possibly large right side pleural effusion appears unchanged from yesterday. Stable cardiac size and mediastinal contours. No confluent left lung opacity. Stable left chest Port-A-Cath. Visualized tracheal air column is within normal limits. Paucity of bowel gas in the visible abdomen. Stable visualized osseous structures. IMPRESSION: Low lung volumes with stable moderate or possibly large right pleural effusion. Electronically Signed   By: Odessa Fleming M.D.   On: 08/01/2022 06:45   DG CHEST PORT 1 VIEW  Result Date: 07/31/2022 CLINICAL DATA:  Status post thoracentesis EXAM: PORTABLE CHEST 1 VIEW COMPARISON:  Previous studies including the examination done earlier today FINDINGS: Transverse diameter of heart is increased. There is interval decrease in size of right pleural effusion. There is no pneumothorax. There are linear densities in right lower lung field suggesting possible atelectasis/pneumonia. No new infiltrates are seen in left lung. Azygous fissure is seen in right upper lung field. Tip of central venous catheter is seen in the region of right atrium. IMPRESSION: There is decrease in amount of right pleural effusion after thoracentesis. There is no pneumothorax. Electronically Signed   By: Ernie Avena M.D.   On: 07/31/2022 17:55   ECHOCARDIOGRAM  LIMITED  Result Date: 07/31/2022    ECHOCARDIOGRAM LIMITED REPORT   Patient Name:   SEBASHTIAN ZWEIG [name] Date of Exam: 07/31/2022 Medical Rec #:  [id number]        Height:       70.0 in Accession #:    [id number]       Weight:       240.0 lb Date of Birth:  January 11, 1976        BSA:          2.255 m Patient Age:    46 years         BP:           98/69 mmHg Patient Gender: M                HR:  107 bpm. Exam Location:  Inpatient Procedure: Limited Echo and Limited Color Doppler Indications:    CHF Acute Systolic  History:        Patient has prior history of Echocardiogram examinations, most                 recent 05/27/2022. CHF; Risk Factors:Hypertension. Thyroid CA, Hx                 of radiation therapy.  Sonographer:    Milda Smart Referring Phys: [id number] JON KNAPP IMPRESSIONS  1. Consider chest CTA to further assess retro atrial structures.  2. Likely prominent descending thoracic aorta behind LA.  3. Left ventricular ejection fraction, by estimation, is 55%. There is mild left ventricular hypertrophy.  4. Right ventricular systolic function is normal. The right ventricular size is normal.  5. No color flow/doppler done.  6. No color flow/doppler done . The aortic valve is tricuspid. No aortic stenosis is present. FINDINGS  Left Ventricle: Left ventricular ejection fraction, by estimation, is 55%. There is mild left ventricular hypertrophy. Right Ventricle: The right ventricular size is normal. Right vetricular wall thickness was not assessed. Right ventricular systolic function is normal. Pericardium: There is no evidence of pericardial effusion. Mitral Valve: No color flow/doppler done. Aortic Valve: No color flow/doppler done. The aortic valve is tricuspid. No aortic stenosis is present. IAS/Shunts: The interatrial septum was not well visualized. Additional Comments: Likely prominent descending thoracic aorta behind LA. Consider chest CTA to further assess retro atrial structures. Color Doppler performed.   LEFT VENTRICLE PLAX 2D LVIDd:         4.80 cm LVIDs:         3.50 cm LV PW:         1.00 cm LV IVS:        1.20 cm  LEFT ATRIUM             Index LA diam:        2.10 cm 0.93 cm/m LA Vol (A2C):   29.6 ml 13.12 ml/m LA Vol (A4C):   10.5 ml 4.66 ml/m LA Biplane Vol: 18.5 ml 8.20 ml/m Charlton Haws MD Electronically signed by Charlton Haws MD Signature Date/Time: 07/31/2022/4:30:34 PM    Final    CT Head Wo Contrast  Result Date: 07/31/2022 CLINICAL DATA:  Mental status change.  History of cancer. EXAM: CT HEAD WITHOUT CONTRAST TECHNIQUE: Contiguous axial images were obtained from the base of the skull through the vertex without intravenous contrast. RADIATION DOSE REDUCTION: This exam was performed according to the departmental dose-optimization program which includes automated exposure control, adjustment of the mA and/or kV according to patient size and/or use of iterative reconstruction technique. COMPARISON:  MRI brain 02/19/2022 FINDINGS: Brain: No evidence of acute infarction, hemorrhage, hydrocephalus, extra-axial collection or mass lesion/mass effect. Vascular: No hyperdense vessel or unexpected calcification. Skull: Normal. Negative for fracture or focal lesion. Sinuses/Orbits: No acute finding. Other: None. IMPRESSION: No acute intracranial abnormality. Electronically Signed   By: Darliss Cheney M.D.   On: 07/31/2022 15:22   DG Chest Port 1 View  Result Date: 07/31/2022 CLINICAL DATA:  Questionable sepsis EXAM: PORTABLE CHEST 1 VIEW COMPARISON:  Chest radiograph dated 02/18/2022 FINDINGS: Left chest wall port tip projects over the superior cavoatrial junction. Asymmetrically low right lung volumes. Bilateral interstitial opacities. Moderate right pleural effusion. No pneumothorax. Right heart border is obscured. No acute osseous abnormality. IMPRESSION: 1. Pulmonary edema. 2. Moderate right pleural effusion. Electronically Signed   By:  Agustin Cree M.D.   On: 07/31/2022 14:21   NM PET Image Restag  (PS) Skull Base To Thigh  Result Date: 07/31/2022 CLINICAL DATA:  Subsequent treatment strategy for Hurthle cell carcinoma of the thyroid with liver metastasis. Evaluate treatment response. On chemotherapy. Thyroidectomy in 2019. EXAM: NUCLEAR MEDICINE PET SKULL BASE TO THIGH TECHNIQUE: 12.4 mCi F-18 FDG was injected intravenously. Full-ring PET imaging was performed from the skull base to thigh after the radiotracer. CT data was obtained and used for attenuation correction and anatomic localization. Fasting blood glucose: 125 mg/dl COMPARISON:  21/30/8657 FINDINGS: Mediastinal blood pool activity: SUV max 2.0 Liver activity: SUV max Not applicable. NECK: No areas of abnormal hypermetabolism. Incidental CT findings: Thyroidectomy.  No cervical adenopathy. CHEST: New hypermetabolic thoracic nodes. Example prevascular node of 9 mm and a S.U.V. max of 8.2 on 76/4. Right cardiophrenic angle node of 1.2 cm and a S.U.V. max of 49.3 on 107/4. New moderate right pleural effusion distorts pulmonary parenchymal anatomy. An index anterior right lung base nodule measures 1.4 cm and a S.U.V. max of 16.0 on 96/4 versus similar in size and a S.U.V. max of 14.3 on the prior exam. just cephalad to this, a right middle lobe 6 mm nodule on 85/4 measured similar on the prior. Incidental CT findings: New large right pleural effusion. Azygous fissure. Left Port-A-Cath tip mid right atrium. Lad coronary artery calcification. ABDOMEN/PELVIS: Hepatic metastasis. The dominant right sided mass measures an SUV of 46.1 today versus a S.U.V. max of 20.7 on the prior exam. Index more inferior right hepatic lobe metastasis measures a S.U.V. max of 14.4 today versus a S.U.V. max of 15.4 on the prior exam. Segment 2-3 metastasis measures a S.U.V. max of 11.3 today versus a S.U.V. max of 6.8 on the prior. Hypermetabolic porta hepatis node measures a S.U.V. max of 5.1 today versus a S.U.V. max of 24.4 on the prior exam. 11 mm today versus 17 mm on  the prior (when remeasured). Incidental CT findings: Normal adrenal glands. Cholecystectomy. Abdominal aortic atherosclerosis. Trace cul-de-sac fluid, new. SKELETON: Index left iliac wing metastasis measures 4.0 cm and a S.U.V. max of 11.8 versus 3.9 cm and a S.U.V. max of 9.2 on the prior. Incidental CT findings: None. IMPRESSION: 1. Development of thoracic nodal metastasis. 2. Progressive hepatic metastasis. This will be better evaluated on MRI, dictated separately. 3. New moderate right pleural effusion, distorting pulmonary parenchymal anatomy. Pulmonary metastasis are felt to be relatively similar. 4. Improvement in isolated porta hepatis adenopathy. 5. Mild progression of index osseous metastasis. 6. New trace pelvic cul-de-sac fluid. 7. Coronary artery atherosclerosis. Aortic Atherosclerosis (ICD10-I70.0). Electronically Signed   By: Jeronimo Greaves M.D.   On: 07/31/2022 11:58   MR LIVER W WO CONTRAST  Result Date: 07/31/2022 CLINICAL DATA:  Hurthle cell carcinoma of the thyroid with liver metastasis. EXAM: MRI ABDOMEN WITHOUT AND WITH CONTRAST TECHNIQUE: Multiplanar multisequence MR imaging of the abdomen was performed both before and after the administration of intravenous contrast. CONTRAST:  10mL GADAVIST GADOBUTROL 1 MMOL/ML IV SOLN COMPARISON:  05/11/2022 FINDINGS: Lower chest: New moderate right pleural effusion. New right cardiophrenic angle adenopathy including at 1.6 cm in 24/3. Hepatobiliary: The dominant mass centered within the high right hepatic lobe, extending into the medial left lobe measures 18.8 x 14.9 by 13.7 cm on 38/13 and 39/21. Compare 16.2 x 11.9 x 12.3 cm on the prior. More caudal right liver lobe lesion measures 2.7 x 2.3 cm on 61/3 versus 2.2 x 1.8 cm  when remeasured in a similar fashion on the prior exam. A lesion which is immediately adjacent and contiguous to the left side of the dominant mass, centered in segment 4A, measures 3.2 x 2.6 cm on 44/13 versus 2.4 x 2.2 cm on the  prior exam (when remeasured). Cholecystectomy, without biliary ductal dilatation. Pancreas: Pancreatic atrophy. No duct dilatation or acute inflammation. Spleen:  Normal in size, without focal abnormality. Adrenals/Urinary Tract: Normal adrenal glands. Normal kidneys, without hydronephrosis. Stomach/Bowel: Normal stomach and abdominal bowel loops. Vascular/Lymphatic: Aortic atherosclerosis. Portacaval 1.2 cm node on 24/15 is decreased from 1.5 cm on the prior exam (when remeasured). Other:  No abdominal ascites. Musculoskeletal: T10 lesion is relatively occult. IMPRESSION: 1. Mild progression of hepatic metastasis. 2. Development of thoracic nodal metastasis. New moderate right pleural effusion. 3. Decrease in isolated porta hepatis adenopathy. Electronically Signed   By: Jeronimo Greaves M.D.   On: 07/31/2022 11:57     Subjective: Patient seen examined at bedside, resting comfortably.  Lying in bed.  Father present at bedside.  Plan to discharge home with home hospice with Dilaudid PCA for pain control.  Pleurx drain was placed yesterday.  Reports improvement in shortness of breath.  Pain currently controlled.  No other specific questions or concerns at this time.  Discussed with palliative care, Dr. Patterson Hammersmith, this morning.  Discharge Exam: Vitals:   08/05/22 0924 08/05/22 1000  BP:  121/73  Pulse:  (!) 122  Resp: 19 (!) 21  Temp:    SpO2: 95% 96%   Vitals:   08/05/22 0800 08/05/22 0900 08/05/22 0924 08/05/22 1000  BP: 119/73   121/73  Pulse: (!) 123 (!) 122  (!) 122  Resp: (!) 23 (!) 24 19 (!) 21  Temp: 97.8 F (36.6 C)     TempSrc: Axillary     SpO2: 94% 94% 95% 96%  Weight:      Height:        Physical Exam: GEN: NAD, alert and oriented x 3, chronically ill in appearance, appears older than stated age HEENT: NCAT, PERRL, EOMI, sclera clear, MMM PULM: Diminished breath sounds right base, normal respiratory effort without accessory muscle use, on room air with SpO2 95% at rest; noted right  Pleurx drain in place CV: Tachycardic, regular rhythm w/o M/G/R GI: abd soft, NTND, NABS, no R/G/M MSK: 2+ pitting peripheral edema bilateral lower extremities, moves all extremities independently    The results of significant diagnostics from this hospitalization (including imaging, microbiology, ancillary and laboratory) are listed below for reference.     Microbiology: Recent Results (from the past 240 hour(s))  Resp panel by RT-PCR (RSV, Flu A&B, Covid) Anterior Nasal Swab     Status: None   Collection Time: 07/31/22  1:08 PM   Specimen: Anterior Nasal Swab  Result Value Ref Range Status   SARS Coronavirus 2 by RT PCR NEGATIVE NEGATIVE Final    Comment: (NOTE) SARS-CoV-2 target nucleic acids are NOT DETECTED.  The SARS-CoV-2 RNA is generally detectable in upper respiratory specimens during the acute phase of infection. The lowest concentration of SARS-CoV-2 viral copies this assay can detect is 138 copies/mL. A negative result does not preclude SARS-Cov-2 infection and should not be used as the sole basis for treatment or other patient management decisions. A negative result may occur with  improper specimen collection/handling, submission of specimen other than nasopharyngeal swab, presence of viral mutation(s) within the areas targeted by this assay, and inadequate number of viral copies(<138 copies/mL). A negative result must  be combined with clinical observations, patient history, and epidemiological information. The expected result is Negative.  Fact Sheet for Patients:  BloggerCourse.com  Fact Sheet for Healthcare Providers:  SeriousBroker.it  This test is no t yet approved or cleared by the [place] FDA and  has been authorized for detection and/or diagnosis of SARS-CoV-2 by FDA under an Emergency Use Authorization (EUA). This EUA will remain  in effect (meaning this test can be used) for the duration of  the COVID-19 declaration under Section 564(b)(1) of the Act, 21 U.S.C.section 360bbb-3(b)(1), unless the authorization is terminated  or revoked sooner.       Influenza A by PCR NEGATIVE NEGATIVE Final   Influenza B by PCR NEGATIVE NEGATIVE Final    Comment: (NOTE) The Xpert Xpress SARS-CoV-2/FLU/RSV plus assay is intended as an aid in the diagnosis of influenza from Nasopharyngeal swab specimens and should not be used as a sole basis for treatment. Nasal washings and aspirates are unacceptable for Xpert Xpress SARS-CoV-2/FLU/RSV testing.  Fact Sheet for Patients: BloggerCourse.com  Fact Sheet for Healthcare Providers: SeriousBroker.it  This test is not yet approved or cleared by the [place] FDA and has been authorized for detection and/or diagnosis of SARS-CoV-2 by FDA under an Emergency Use Authorization (EUA). This EUA will remain in effect (meaning this test can be used) for the duration of the COVID-19 declaration under Section 564(b)(1) of the Act, 21 U.S.C. section 360bbb-3(b)(1), unless the authorization is terminated or revoked.     Resp Syncytial Virus by PCR NEGATIVE NEGATIVE Final    Comment: (NOTE) Fact Sheet for Patients: BloggerCourse.com  Fact Sheet for Healthcare Providers: SeriousBroker.it  This test is not yet approved or cleared by the [place] FDA and has been authorized for detection and/or diagnosis of SARS-CoV-2 by FDA under an Emergency Use Authorization (EUA). This EUA will remain in effect (meaning this test can be used) for the duration of the COVID-19 declaration under Section 564(b)(1) of the Act, 21 U.S.C. section 360bbb-3(b)(1), unless the authorization is terminated or revoked.  Performed at [facility], [address], [place], [place] [address]   Blood Culture (routine x 2)     Status: None   Collection  Time: 07/31/22  1:24 PM   Specimen: BLOOD  Result Value Ref Range Status   Specimen Description   Final    BLOOD PORTA CATH Performed at [facility], [address] [place], [place] [address]    Special Requests   Final    BOTTLES DRAWN AEROBIC AND ANAEROBIC Blood Culture adequate volume Performed at [facility], [address], [place], [place] [address]    Culture   Final    NO GROWTH 5 DAYS Performed at [facility], [address], [place], [place] [address]    Report Status 08/05/2022 FINAL  Final  Blood Culture (routine x 2)     Status: None   Collection Time: 07/31/22  1:41 PM   Specimen: BLOOD  Result Value Ref Range Status   Specimen Description   Final    BLOOD LEFT ANTECUBITAL Performed at [facility], [address] [place], [place] [address]    Special Requests   Final    BOTTLES DRAWN AEROBIC AND ANAEROBIC Blood Culture adequate volume Performed at [facility], [address] [place], [place] [address]    Culture   Final    NO GROWTH 5 DAYS Performed at [facility],  [address] [place], [place] [address]    Report Status 08/05/2022 FINAL  Final  MRSA Next Gen by PCR, Nasal     Status: None   Collection Time: 07/31/22  5:58 PM   Specimen: Nasal Mucosa; Nasal Swab  Result Value Ref Range Status   MRSA by PCR Next Gen NOT DETECTED NOT DETECTED Final    Comment: (NOTE) The GeneXpert MRSA Assay (FDA approved for NASAL specimens only), is one component of a comprehensive MRSA colonization surveillance program. It is not intended to diagnose MRSA infection nor to guide or monitor treatment for MRSA infections. Test performance is not FDA approved in patients less than 58 years old. Performed at [facility], [address], [place], [place] [address]   Body fluid culture w Gram Stain     Status: None   Collection Time: 07/31/22  5:58 PM   Specimen: Pleural Fluid   Result Value Ref Range Status   Specimen Description   Final    PLEURAL Performed at [facility], [address], [place], [place] [address]    Special Requests   Final    NONE Performed at [facility], [address], [place], [place] [address]    Gram Stain NO WBC SEEN NO ORGANISMS SEEN   Final   Culture   Final    NO GROWTH 3 DAYS Performed at [facility], [address] [place], [place] [address]    Report Status 08/04/2022 FINAL  Final     Labs: BNP (last 3 results) Recent Labs    07/31/22 1249  BNP 104.9*   Basic Metabolic Panel: Recent Labs  Lab 07/31/22 1249 08/01/22 0507 08/02/22 0500 08/03/22 0321 08/04/22 0547 08/05/22 0840  NA 131* 135 130* 129* 128* 127*  K 3.2* 3.3* 3.6 3.7 3.6 3.7  CL 90* 95* 94* 92* 93* 91*  CO2 28 26 26 27 27 27   GLUCOSE 106* 107* 127* 110* 115* 145*  BUN 29* 27* 22* 20 16 14   CREATININE 0.82 0.82 0.74 0.70 0.64 0.71  CALCIUM 8.2* 7.8* 7.5* 7.6* 7.3* 7.3*  MG 1.6* 1.7 1.9 2.0 2.1  --   PHOS  --  3.9  --   --   --   --    Liver Function Tests: Recent Labs  Lab 07/31/22 1249 07/31/22 1714 08/01/22 0507 08/04/22 0547 08/05/22 0840  AST 35  --  38 46* 57*  ALT 24  --  21 23 21   ALKPHOS 566*  --  517* 828* 869*  BILITOT 2.4*  --  2.6* 2.3* 2.2*  PROT 6.1* 5.7* 5.6* 5.9* 5.8*  ALBUMIN 2.6*  --  2.1* 2.3* 2.2*   No results for input(s): "LIPASE", "AMYLASE" in the last 168 hours. Recent Labs  Lab 07/31/22 1507  AMMONIA 28   CBC: Recent Labs  Lab 07/31/22 1249 08/01/22 0507 08/03/22 0321 08/04/22 0547 08/05/22 0840  WBC 9.4 8.5 11.0* 10.3 10.6*  NEUTROABS 8.0*  --   --  8.7* 9.0*  HGB 9.3* 9.3* 9.3* 9.0* 8.7*  HCT 30.4* 29.9* 30.4* 29.8* 29.3*  MCV 93.0 93.1 95.0 95.2 95.4  PLT 161 168 167 149* 148*   Cardiac Enzymes: No results for input(s): "CKTOTAL", "CKMB", "CKMBINDEX", "TROPONINI" in the last 168 hours. BNP: Invalid input(s): "POCBNP" CBG: Recent Labs   Lab 08/01/22 2001 08/02/22 0004 08/02/22 0351 08/02/22 0821 08/02/22 2010  GLUCAP 117* 118* 111* 102* 118*   D-Dimer No results for  input(s): "DDIMER" in the last 72 hours. Hgb A1c No results for input(s): "HGBA1C" in the last 72 hours. Lipid Profile No results for input(s): "CHOL", "HDL", "LDLCALC", "TRIG", "CHOLHDL", "LDLDIRECT" in the last 72 hours. Thyroid function studies No results for input(s): "TSH", "T4TOTAL", "T3FREE", "THYROIDAB" in the last 72 hours.  Invalid input(s): "FREET3" Anemia work up No results for input(s): "VITAMINB12", "FOLATE", "FERRITIN", "TIBC", "IRON", "RETICCTPCT" in the last 72 hours. Urinalysis    Component Value Date/Time   COLORURINE YELLOW 07/31/2022 1459   APPEARANCEUR CLEAR 07/31/2022 1459   LABSPEC 1.012 07/31/2022 1459   PHURINE 5.0 07/31/2022 1459   GLUCOSEU NEGATIVE 07/31/2022 1459   HGBUR NEGATIVE 07/31/2022 1459   BILIRUBINUR NEGATIVE 07/31/2022 1459   BILIRUBINUR negative 06/11/2013 1309   KETONESUR NEGATIVE 07/31/2022 1459   PROTEINUR NEGATIVE 07/31/2022 1459   UROBILINOGEN 0.2 06/12/2013 0149   NITRITE NEGATIVE 07/31/2022 1459   LEUKOCYTESUR NEGATIVE 07/31/2022 1459   Sepsis Labs Recent Labs  Lab 08/01/22 0507 08/03/22 0321 08/04/22 0547 08/05/22 0840  WBC 8.5 11.0* 10.3 10.6*   Microbiology Recent Results (from the past 240 hour(s))  Resp panel by RT-PCR (RSV, Flu A&B, Covid) Anterior Nasal Swab     Status: None   Collection Time: 07/31/22  1:08 PM   Specimen: Anterior Nasal Swab  Result Value Ref Range Status   SARS Coronavirus 2 by RT PCR NEGATIVE NEGATIVE Final    Comment: (NOTE) SARS-CoV-2 target nucleic acids are NOT DETECTED.  The SARS-CoV-2 RNA is generally detectable in upper respiratory specimens during the acute phase of infection. The lowest concentration of SARS-CoV-2 viral copies this assay can detect is 138 copies/mL. A negative result does not preclude SARS-Cov-2 infection and should not be used  as the sole basis for treatment or other patient management decisions. A negative result may occur with  improper specimen collection/handling, submission of specimen other than nasopharyngeal swab, presence of viral mutation(s) within the areas targeted by this assay, and inadequate number of viral copies(<138 copies/mL). A negative result must be combined with clinical observations, patient history, and epidemiological information. The expected result is Negative.  Fact Sheet for Patients:  BloggerCourse.com  Fact Sheet for Healthcare Providers:  SeriousBroker.it  This test is no t yet approved or cleared by the [place] FDA and  has been authorized for detection and/or diagnosis of SARS-CoV-2 by FDA under an Emergency Use Authorization (EUA). This EUA will remain  in effect (meaning this test can be used) for the duration of the COVID-19 declaration under Section 564(b)(1) of the Act, 21 U.S.C.section 360bbb-3(b)(1), unless the authorization is terminated  or revoked sooner.       Influenza A by PCR NEGATIVE NEGATIVE Final   Influenza B by PCR NEGATIVE NEGATIVE Final    Comment: (NOTE) The Xpert Xpress SARS-CoV-2/FLU/RSV plus assay is intended as an aid in the diagnosis of influenza from Nasopharyngeal swab specimens and should not be used as a sole basis for treatment. Nasal washings and aspirates are unacceptable for Xpert Xpress SARS-CoV-2/FLU/RSV testing.  Fact Sheet for Patients: BloggerCourse.com  Fact Sheet for Healthcare Providers: SeriousBroker.it  This test is not yet approved or cleared by the [place] FDA and has been authorized for detection and/or diagnosis of SARS-CoV-2 by FDA under an Emergency Use Authorization (EUA). This EUA will remain in effect (meaning this test can be used) for the duration of the COVID-19 declaration under Section 564(b)(1)  of the Act, 21 U.S.C. section 360bbb-3(b)(1), unless the authorization is terminated or revoked.  Resp Syncytial Virus by PCR NEGATIVE NEGATIVE Final    Comment: (NOTE) Fact Sheet for Patients: BloggerCourse.com  Fact Sheet for Healthcare Providers: SeriousBroker.it  This test is not yet approved or cleared by the [place] FDA and has been authorized for detection and/or diagnosis of SARS-CoV-2 by FDA under an Emergency Use Authorization (EUA). This EUA will remain in effect (meaning this test can be used) for the duration of the COVID-19 declaration under Section 564(b)(1) of the Act, 21 U.S.C. section 360bbb-3(b)(1), unless the authorization is terminated or revoked.  Performed at [facility], [address] [place], [place] [address]   Blood Culture (routine x 2)     Status: None   Collection Time: 07/31/22  1:24 PM   Specimen: BLOOD  Result Value Ref Range Status   Specimen Description   Final    BLOOD PORTA CATH Performed at [facility], [address] [place], [place] [address]    Special Requests   Final    BOTTLES DRAWN AEROBIC AND ANAEROBIC Blood Culture adequate volume Performed at [facility], [address], [place], [place] [address]    Culture   Final    NO GROWTH 5 DAYS Performed at [facility], [address], [place], [place] [address]    Report Status 08/05/2022 FINAL  Final  Blood Culture (routine x 2)     Status: None   Collection Time: 07/31/22  1:41 PM   Specimen: BLOOD  Result Value Ref Range Status   Specimen Description   Final    BLOOD LEFT ANTECUBITAL Performed at [facility], [address] [place], [place] [address]    Special Requests   Final    BOTTLES DRAWN AEROBIC AND ANAEROBIC Blood Culture adequate volume Performed at [facility], [address] [place], [place] [address]    Culture   Final     NO GROWTH 5 DAYS Performed at [facility], [address], [place], [place] [address]    Report Status 08/05/2022 FINAL  Final  MRSA Next Gen by PCR, Nasal     Status: None   Collection Time: 07/31/22  5:58 PM   Specimen: Nasal Mucosa; Nasal Swab  Result Value Ref Range Status   MRSA by PCR Next Gen NOT DETECTED NOT DETECTED Final    Comment: (NOTE) The GeneXpert MRSA Assay (FDA approved for NASAL specimens only), is one component of a comprehensive MRSA colonization surveillance program. It is not intended to diagnose MRSA infection nor to guide or monitor treatment for MRSA infections. Test performance is not FDA approved in patients less than 44 years old. Performed at [facility], [address], [place], [place] [address]   Body fluid culture w Gram Stain     Status: None   Collection Time: 07/31/22  5:58 PM   Specimen: Pleural Fluid  Result Value Ref Range Status   Specimen Description   Final    PLEURAL Performed at [facility], [address], [place], [place] [address]    Special Requests   Final    NONE Performed at [facility], [address], [place], [place] [address]    Gram Stain NO WBC SEEN NO ORGANISMS SEEN   Final   Culture   Final    NO GROWTH 3 DAYS Performed at [facility], [address] [place], [place] [address]    Report Status  08/04/2022 FINAL  Final     Time coordinating discharge: Over 30 minutes  SIGNED:   Alvira Philips [place], DO  [facility] 08/05/2022, 10:31 AM

## 2022-08-05 NOTE — Progress Notes (Signed)
Chaplain followed up with Brian [name].  He was in good spirits today and was hoping to be able to go home for his birthday tomorrow and to be with his family. He was in good spirits, as was his father who was at bedside.  No particular needs expressed at this time.

## 2022-08-05 NOTE — Progress Notes (Addendum)
Patient ID: Brian [name], male   DOB: Mar 14, 1975, 47 y.o.   MRN: [id number] Pt s/p right pleurx cath placement yesterday- 550 cc pleural fluid removed; currently without new c/o; sl drowsy; father in room; afebrile, remains tachycardic; O2 sats 96% ; WBC 10.6, hgb 8.7(9), rt pleurx cath covered with clean, gauze dressing; site mildly tender; drain pleurx prn; further plans as per CCM/oncology/TRH/palliative

## 2022-08-05 NOTE — Plan of Care (Signed)

## 2022-08-05 NOTE — Progress Notes (Signed)
  Daily Progress Note   Patient Name: Brian [name]       Date: 08/05/2022 DOB: 04-11-1975  Age: 47 y.o. MRN#: [id number] Attending Physician: [place], Eric J, DO Primary Care Physician: Koren Bound, MD Admit Date: 07/31/2022 Length of Stay: 5 days  Reason for Consultation/Follow-up: Establishing goals of care and Symptom Management  Subjective:   CC: Patient noting pain managed on PCA. Following up with complex medical decision making and symptom management.   Subjective:  Reviewed EMR prior to presenting to bedside.  Discussed care with hospitalist as well.  Presented to bedside to meet with patient this morning.  Patient noting pain managed by PCA.  Plan is for patient to go home with hospice support today on PCA.  Patient did note small bowel movement yesterday.  Discussed importance of continued bowel regimen moving forward.  Spent time providing emotional support via active listening.  Noted palliative medicine team would continue to follow along and be available during patient's medical journey.  Review of Systems Constipation improved Objective:   Vital Signs:  BP 119/73 (BP Location: Right Arm)   Pulse (!) 123   Temp 97.8 F (36.6 C) (Axillary)   Resp 19   Ht 5\' 10"  (1.778 m)   Wt 108.4 kg   SpO2 95%   BMI 34.29 kg/m   Physical Exam: General: NAD, alert, laying in bed, chronically ill appearing  Eyes: no drainage noted HENT: moist mucous membranes Cardiovascular: tachycardia noted Respiratory: no increased work of breathing noted, not in respiratory distress Abdomen: distended Neuro: A&Ox4, following commands easily Psych: appropriately answers all questions  Imaging:  I personally reviewed recent imaging.   Assessment & Plan:   Assessment: Patient is a 47 year old with metastatic thyroid cancer, recent PET scan and MRI liver showing progression of cancer new right pleural effusion. Palliative medicine team consulted to assist with complex medical  decision making and symptom management.   Recommendations/Plan: # Complex medical decision making/goals of care:  - Plan is for patient to return home with hospice support today with PCA for pain management.  -  Code Status: DNR  # Symptom management:  -Pain, acute on chronic in the setting of metastatic thyroid cancer Discussed with patient does not need to continue on CO2 monitor as is not going to change management.    -Continue IV dilaudid PCA with continuous basal rate of 0.8mg /hr and bolus rate of 0.5mg  q 10 mins. Coordinated with TOC for this to be continued once home via hospice.   -Constipation   -Continue Miralax 17gm daily   -Continue senna to 1 tab BID  # Psychosocial Support:  -wife, father  # Discharge Planning: Home with Hospice today  Discussed with: patient, patient's father, RN, hospitalist  Thank you for allowing the palliative care team to participate in the care Brian [name].  Alvester Morin, DO Palliative Care Provider PMT # [phone]  If patient remains symptomatic despite maximum doses, please call PMT at [phone] between 0700 and 1900. Outside of these hours, please call attending, as PMT does not have night coverage.  *Please note that this is a verbal dictation therefore any spelling or grammatical errors are due to the "Dragon [employer] One" system interpretation.

## 2022-08-05 NOTE — Progress Notes (Addendum)
Chaplain provided emotional and spiritual support to Brian [name] and his family.  They had a difficult morning and [name] father in law was taken to the hospital with stroke symptoms.  His father's dog had also been bit by a copperhead. 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 Chaplain also provided listening and support to his 36 year old son and to his father.  [address], Bcc Pager, [phone]

## 2022-08-05 NOTE — TOC Transition Note (Addendum)
Transition of Care [facility]) - CM/SW Discharge Note   Patient Details  Name: Brian [name] MRN: [id number] Date of Birth: [birth year]  Transition of Care [facility]) CM/SW Contact:  Lavenia Atlas, RN Phone Number: 08/05/2022, 12:00 PM   Clinical Narrative:   This RNCM spoke with Jacki Cones with [facility] re: home hospice services at discharge today at 1pm. Jacki Cones reports [facility] did not receive the e-prescription however able to get PCA & medication via dc summary. The discharge plan remains the same, patient is to discharge home today with bolus from pain pump/PCA, d/t one hour ride home from hospital. Patient's father will transport patient home via POV. DME is being delivered today to patient's home. [facility] will be at patient's home at 2pm to get patient set up at home w/PCA pump.   No additional TOC needs at this time.   - 1:30pm This RNCM spoke with Jacki Cones with [facility] to advise patient has not been discharged yet. Spoke with patient who reports he spoke with the hospice RN who is requesting he give her a call once he leaves the hospital. This RNCM spoke with patient's father [name] who is at the hospital to advise RN is discharging patient now.    - 2:00pm This RNCM notified Jacki Cones that RN is discharging patient now.   No current TOC needs.   Final next level of care: Home w Hospice Care Barriers to Discharge: No Barriers Identified   Patient Goals and CMS Choice CMS Medicare.gov Compare Post Acute Care list provided to:: Patient Represenative (must comment) Choice offered to / list presented to : Spouse  Discharge Placement  Home with hospice                        Discharge Plan and Services Additional resources added to the After Visit Summary for   In-house Referral: Hospice / Palliative Care Discharge Planning Services: CM Consult Post Acute Care Choice: Hospice ([facility] w/[facility])          DME Arranged: N/A DME Agency:  NA       HH Arranged: RN (home hospice w/[facility]) HH Agency: Other - See comment ([facility] for home hospice) Date HH Agency Contacted: 08/02/22 Time HH Agency Contacted: [phone] Representative spoke with at [facility] Agency: Jacki Cones  Social Determinants of Health (SDOH) Interventions SDOH Screenings   Food Insecurity: No Food Insecurity (08/01/2022)  Housing: Low Risk  (08/01/2022)  Transportation Needs: No Transportation Needs (08/01/2022)  Utilities: Not At Risk (08/01/2022)  Tobacco Use: Low Risk  (08/04/2022)     Readmission Risk Interventions    08/04/2022   12:03 PM 08/02/2022    9:20 AM 02/12/2020   12:37 PM  Readmission Risk Prevention Plan  Transportation Screening Complete Complete Complete  PCP or Specialist Appt within 3-5 Days Complete  Complete  HRI or Home Care Consult Complete  Complete  Social Work Consult for Recovery Care Planning/Counseling Complete  Complete  Palliative Care Screening Complete  Complete  Medication Review [employer]) Complete Complete Complete  PCP or Specialist appointment within 3-5 days of discharge  Complete   HRI or Home Care Consult  Complete   SW Recovery Care/Counseling Consult  Complete   Palliative Care Screening  Complete   Skilled Nursing Facility  Not Applicable

## 2022-08-23 IMAGING — CT CT HEAD W/O CM
3 series · 15 of 47 positions shown, 18 images · non-contrast
Comparison: None.

CLINICAL DATA: Headache

EXAM:
CT HEAD WITHOUT CONTRAST
TECHNIQUE: Contiguous axial images were obtained from the base of the skull
through the vertex without intravenous contrast.

[Series 2: head wo · axial · 0.50mm/px · z∈[+1268,+1404]mm · 9 of 33 slices shown, 12 images]
[im 3/33  brain]
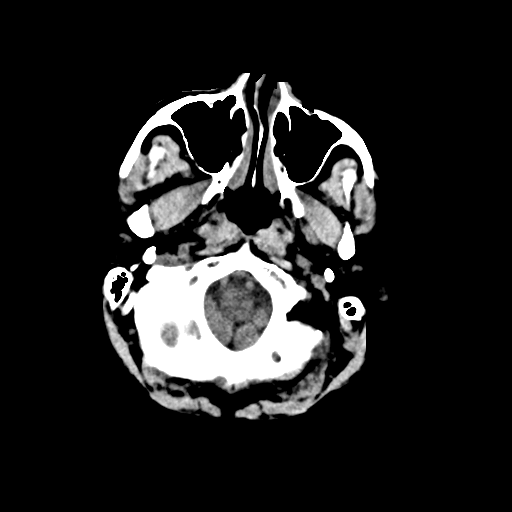
[im 3/33  bone]
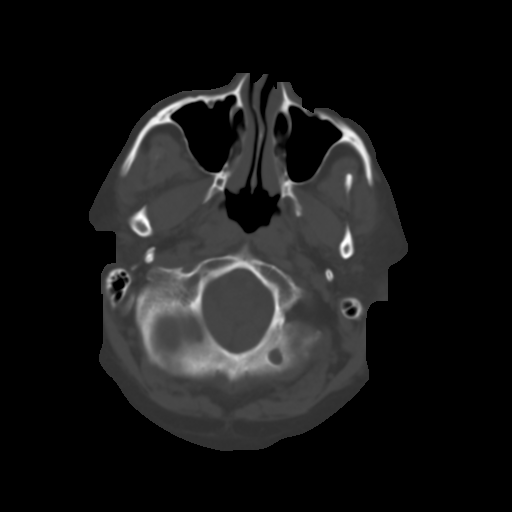
[im 6/33  brain]
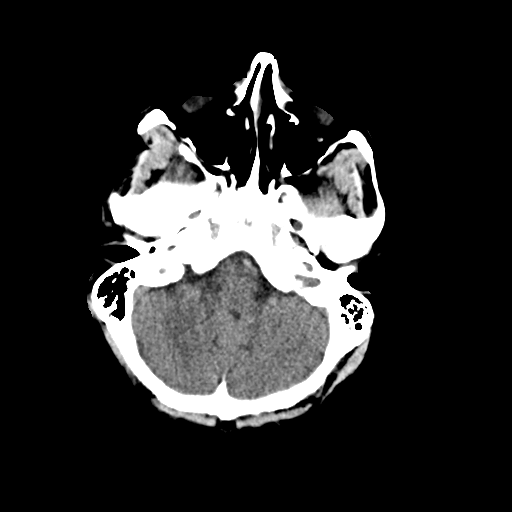
[im 9/33  brain]
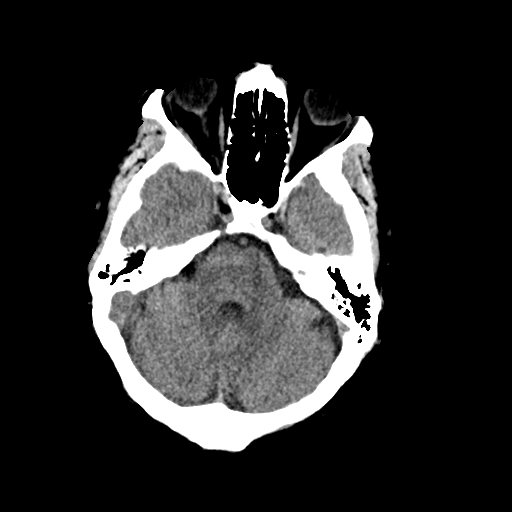
[im 13/33  brain]
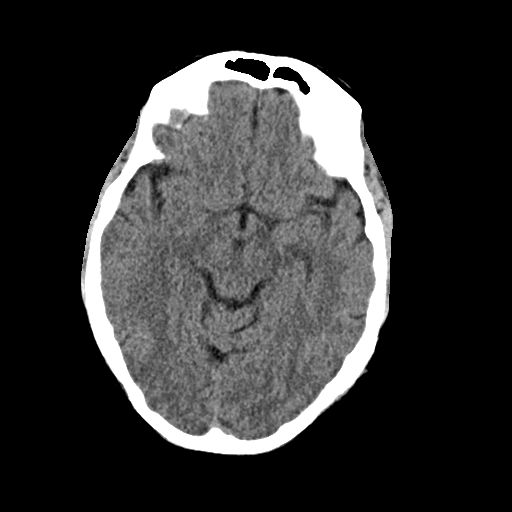
[im 17/33  brain]
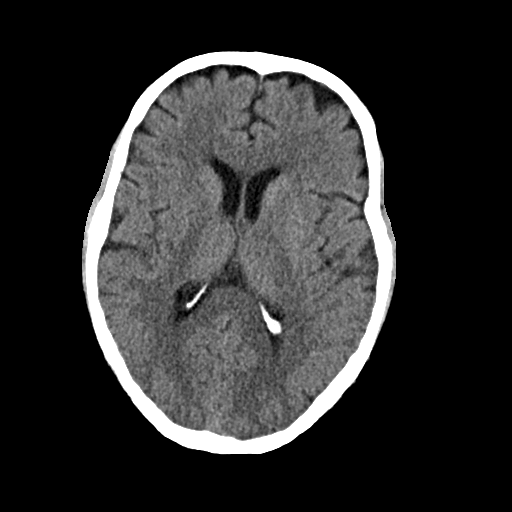
[im 17/33  bone]
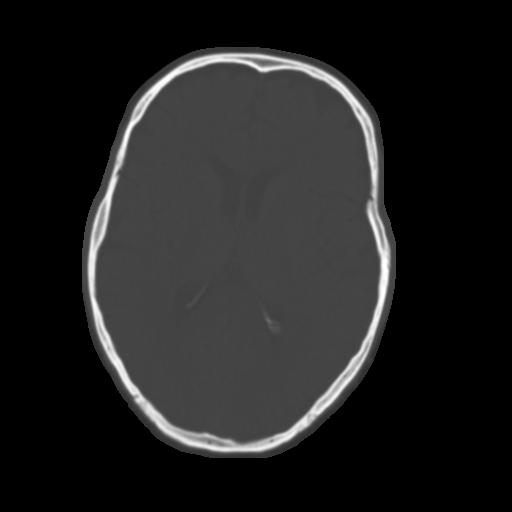
[im 20/33  brain]
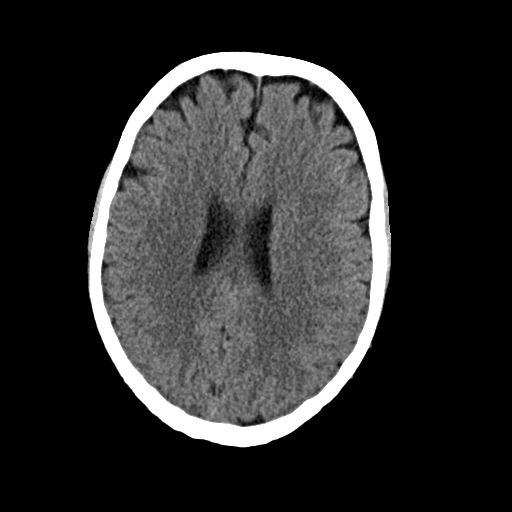
[im 24/33  brain]
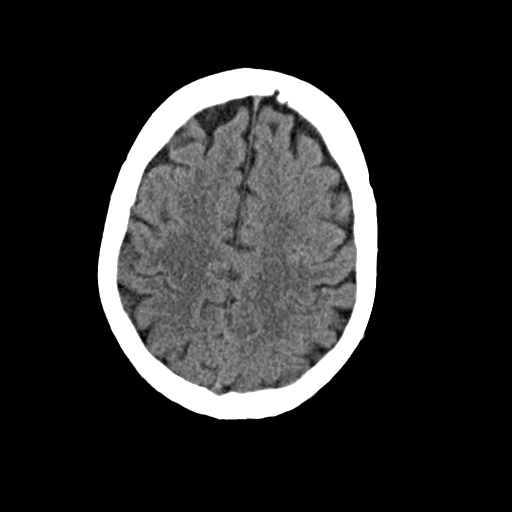
[im 27/33  brain]
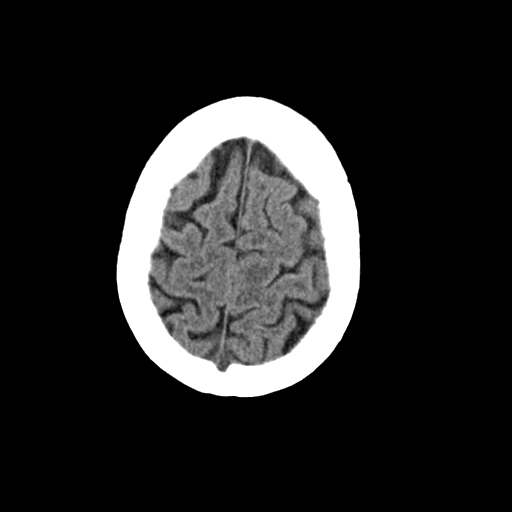
[im 30/33  brain]
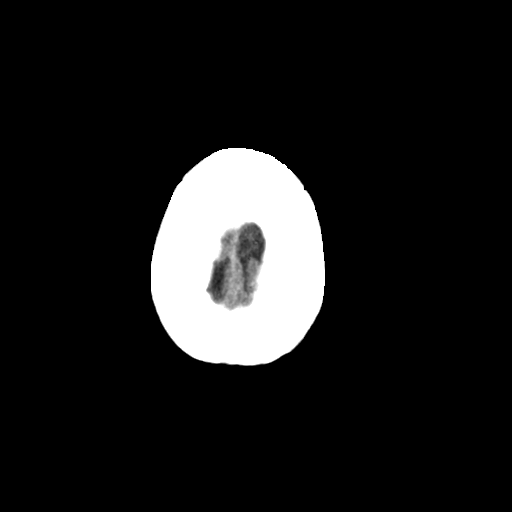
[im 30/33  bone]
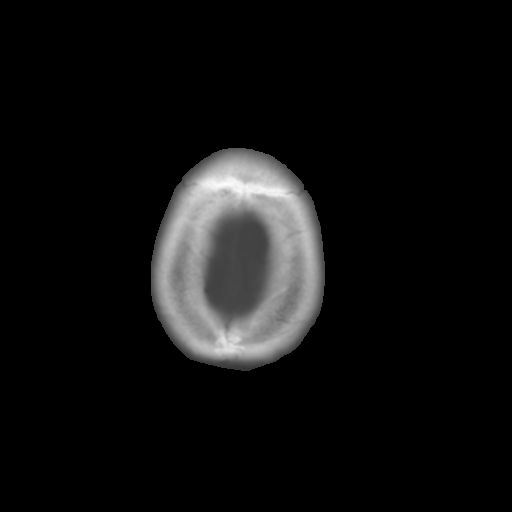

[Series 4: coronal soft · coronal · 0.36mm/px · 3 of 80 slices shown]
[im 27/80  brain]
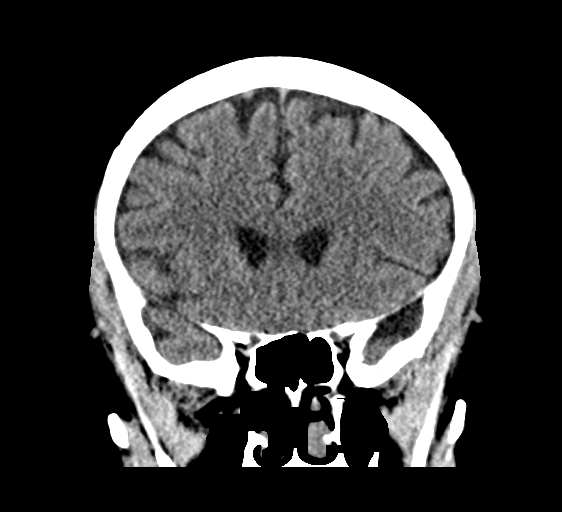
[im 36/80  brain]
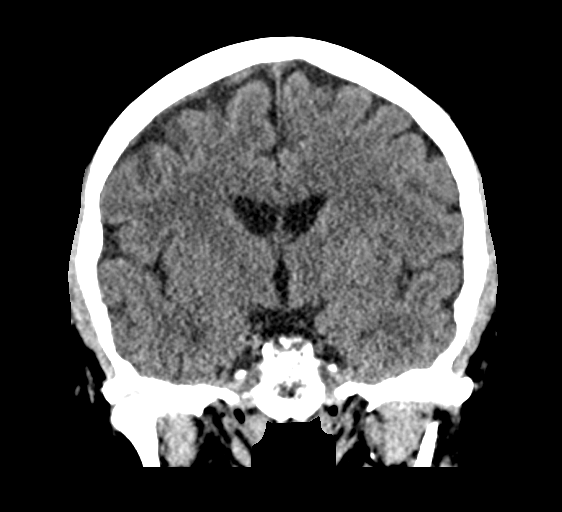
[im 44/80  brain]
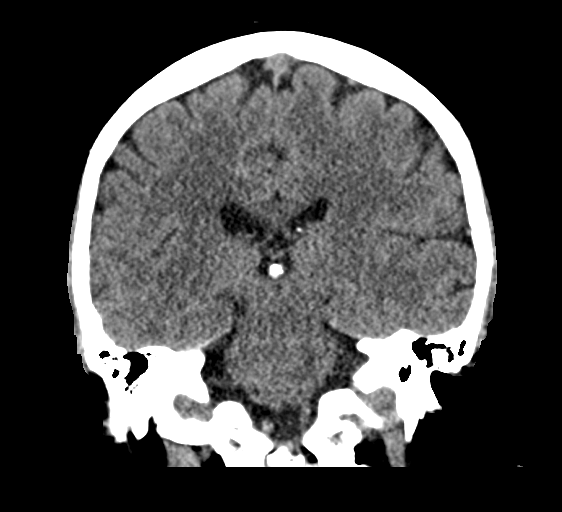

[Series 5: sag soft · sagittal · 0.31mm/px · 3 of 67 slices shown]
[im 23/67  brain]
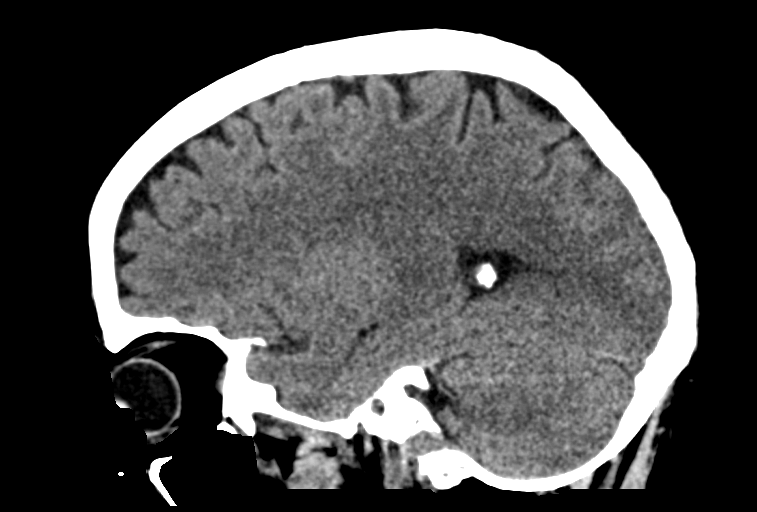
[im 34/67  brain]
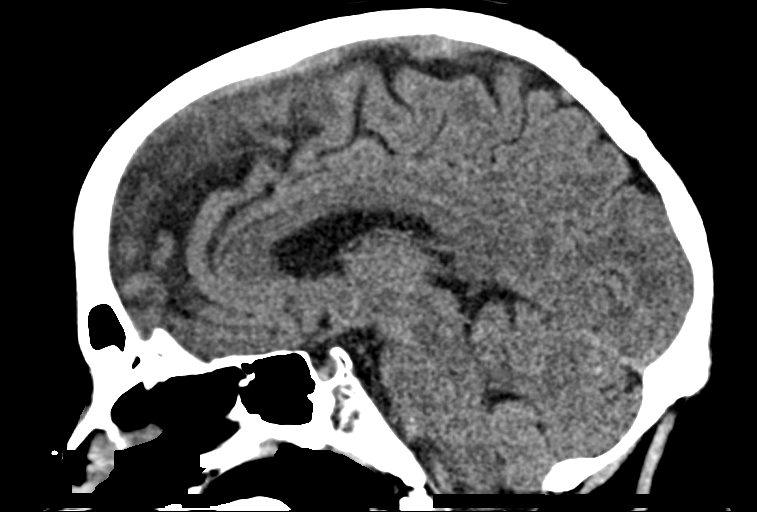
[im 45/67  brain]
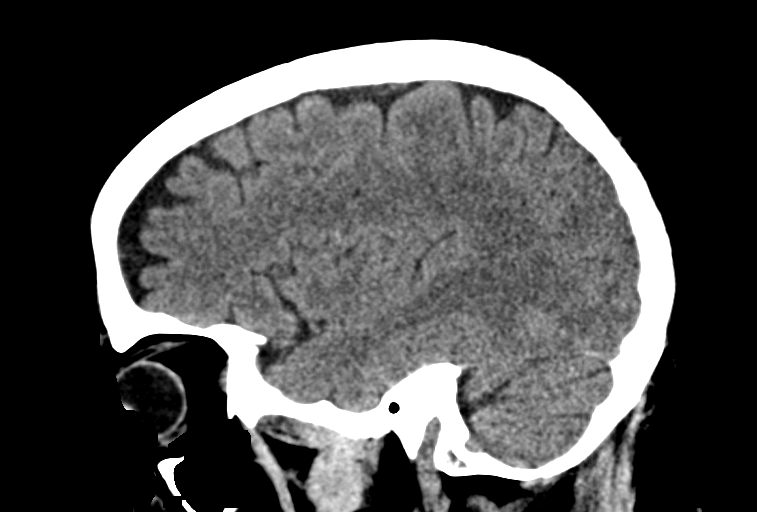

[15 of 47 positions shown; findings below may reference images not displayed]

FINDINGS: Brain: No evidence of acute territorial infarction, hemorrhage,
hydrocephalus,extra-axial collection or mass lesion/mass effect.
Normal gray-white differentiation. Ventricles are normal in size and
contour.

Vascular: No hyperdense vessel or unexpected calcification.

Skull: The skull is intact. No fracture or focal lesion identified.

Sinuses/Orbits: The visualized paranasal sinuses and mastoid air
cells are clear. The orbits and globes intact.

Other: None
IMPRESSION: No acute intracranial abnormality.

## 2022-09-07 ENCOUNTER — Telehealth: Payer: Self-pay | Admitting: *Deleted

## 2022-09-07 NOTE — Telephone Encounter (Signed)
Received a call from [facility] with Hospice asking if Scopolamine 1 mg patch can be increased for patients nausea.  Dr Myna Hidalgo states can put 2 patches on every 3 days.  Also to take Olanzepine 10 mg every night for nausea.  Amber notified and instructions understood

## 2022-10-26 ENCOUNTER — Telehealth: Payer: Self-pay

## 2022-10-26 NOTE — Telephone Encounter (Signed)
Received call from [place], RN with [facility]. Per Elnita Maxwell pt continues to get progressively more lethargic and confused. He reports dizziness. Per Elnita Maxwell pt's BP was 88/60 today so she did not drain his pleurx although she's been draining approximately 1L q2d. Questions if Dr Myna Hidalgo would like to give parameters. Reports that pt's lungs are clear and he is not SOB. Continues to have pitting edema to BLE from mid calf to toes. Taking Lasix 40mg  bid.   Per Dr Myna Hidalgo, orders given to stop Lasix at this time. Do not drain Pleurx if SBP <100. Cheryl verbalizes understanding using teachback. dph

## 2022-11-02 ENCOUNTER — Telehealth: Payer: Self-pay | Admitting: *Deleted

## 2022-11-02 NOTE — Telephone Encounter (Signed)
Received a call from [facility] in [facility].  PAtient complaining of red, tender, warm left great toe.  Very bothersome to patient.  Dr Myna Hidalgo notified.  Ordered Medrol Dose Pack.  Hospice Notified.

## 2022-11-16 ENCOUNTER — Telehealth: Payer: Self-pay | Admitting: *Deleted

## 2022-11-16 ENCOUNTER — Other Ambulatory Visit: Payer: Self-pay | Admitting: Hematology & Oncology

## 2022-11-16 NOTE — Telephone Encounter (Signed)
Received notification from [facility] that patient passed away December 07, 2022.  Flowers ordered per Dr Myna Hidalgo

## 2022-11-17 DEATH — deceased

## 2023-06-10 ENCOUNTER — Other Ambulatory Visit (HOSPITAL_BASED_OUTPATIENT_CLINIC_OR_DEPARTMENT_OTHER): Payer: Self-pay

## 2024-01-01 IMAGING — CR DG SHOULDER 2+V*R*
3 series · 3 of 3 positions shown · non-contrast
Comparison: None.

CLINICAL DATA: Right shoulder pain.  Metastatic thyroid cancer.

EXAM:
RIGHT SHOULDER - 2+ VIEW

[w shoulder grashey right]
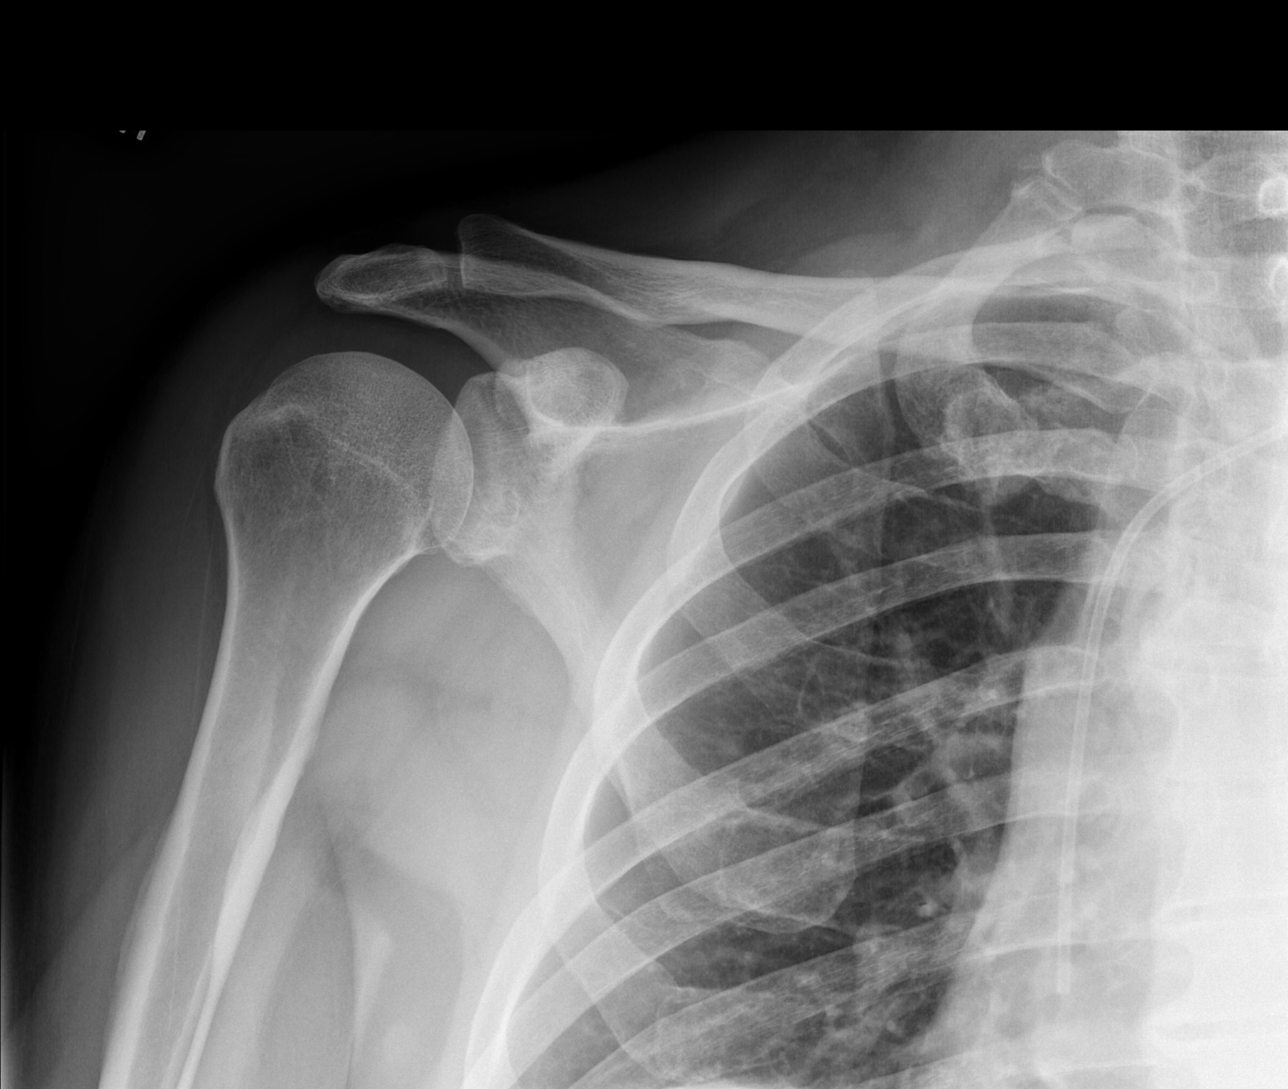

[w shoulder y view right]
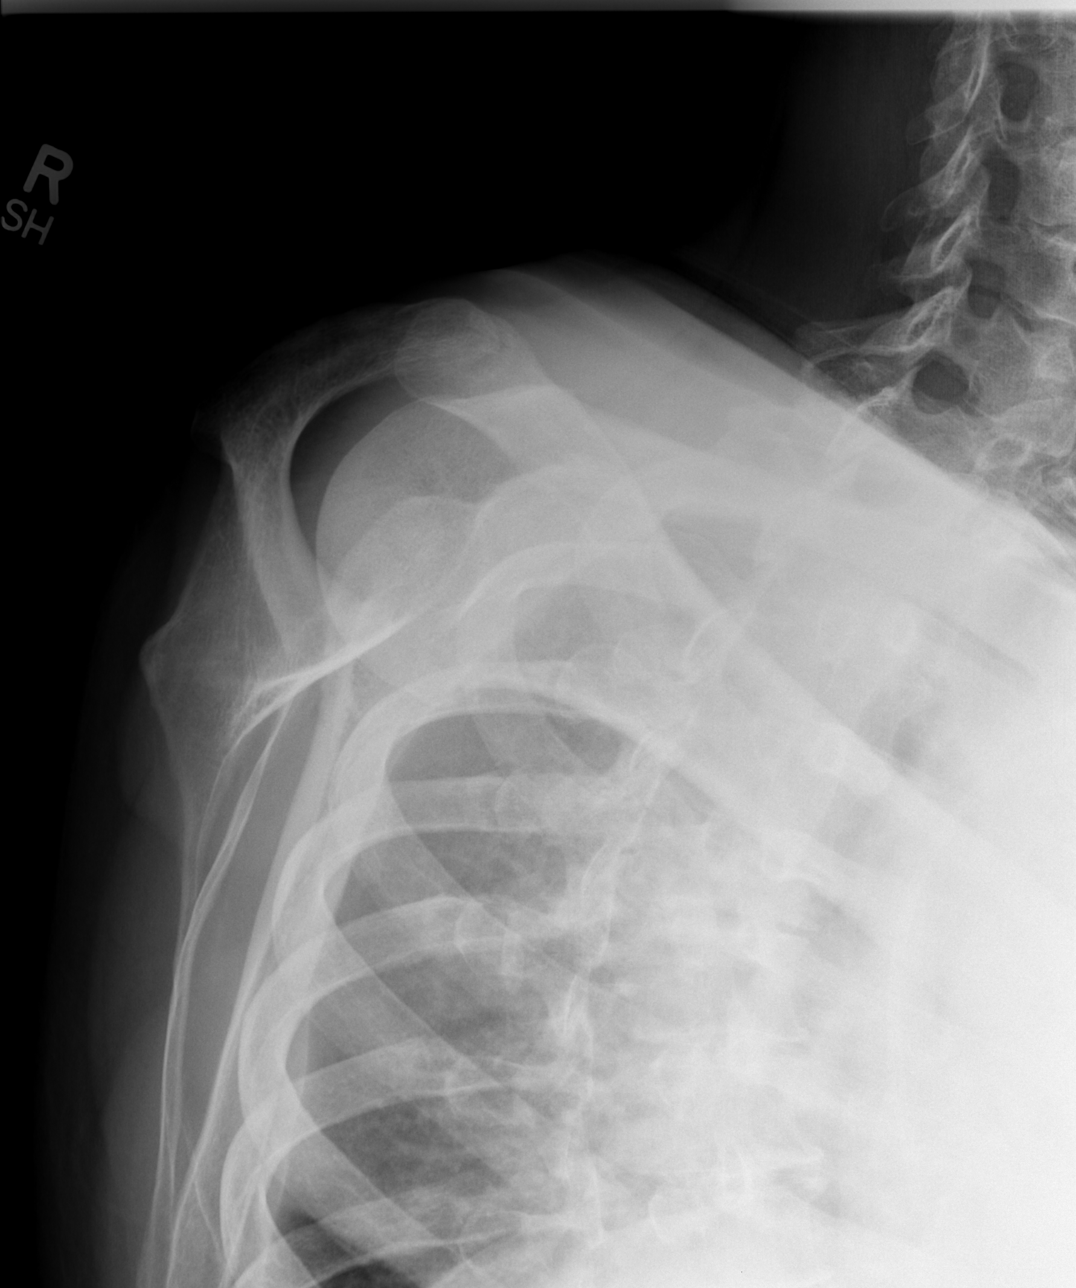

[x shoulder axillary right]
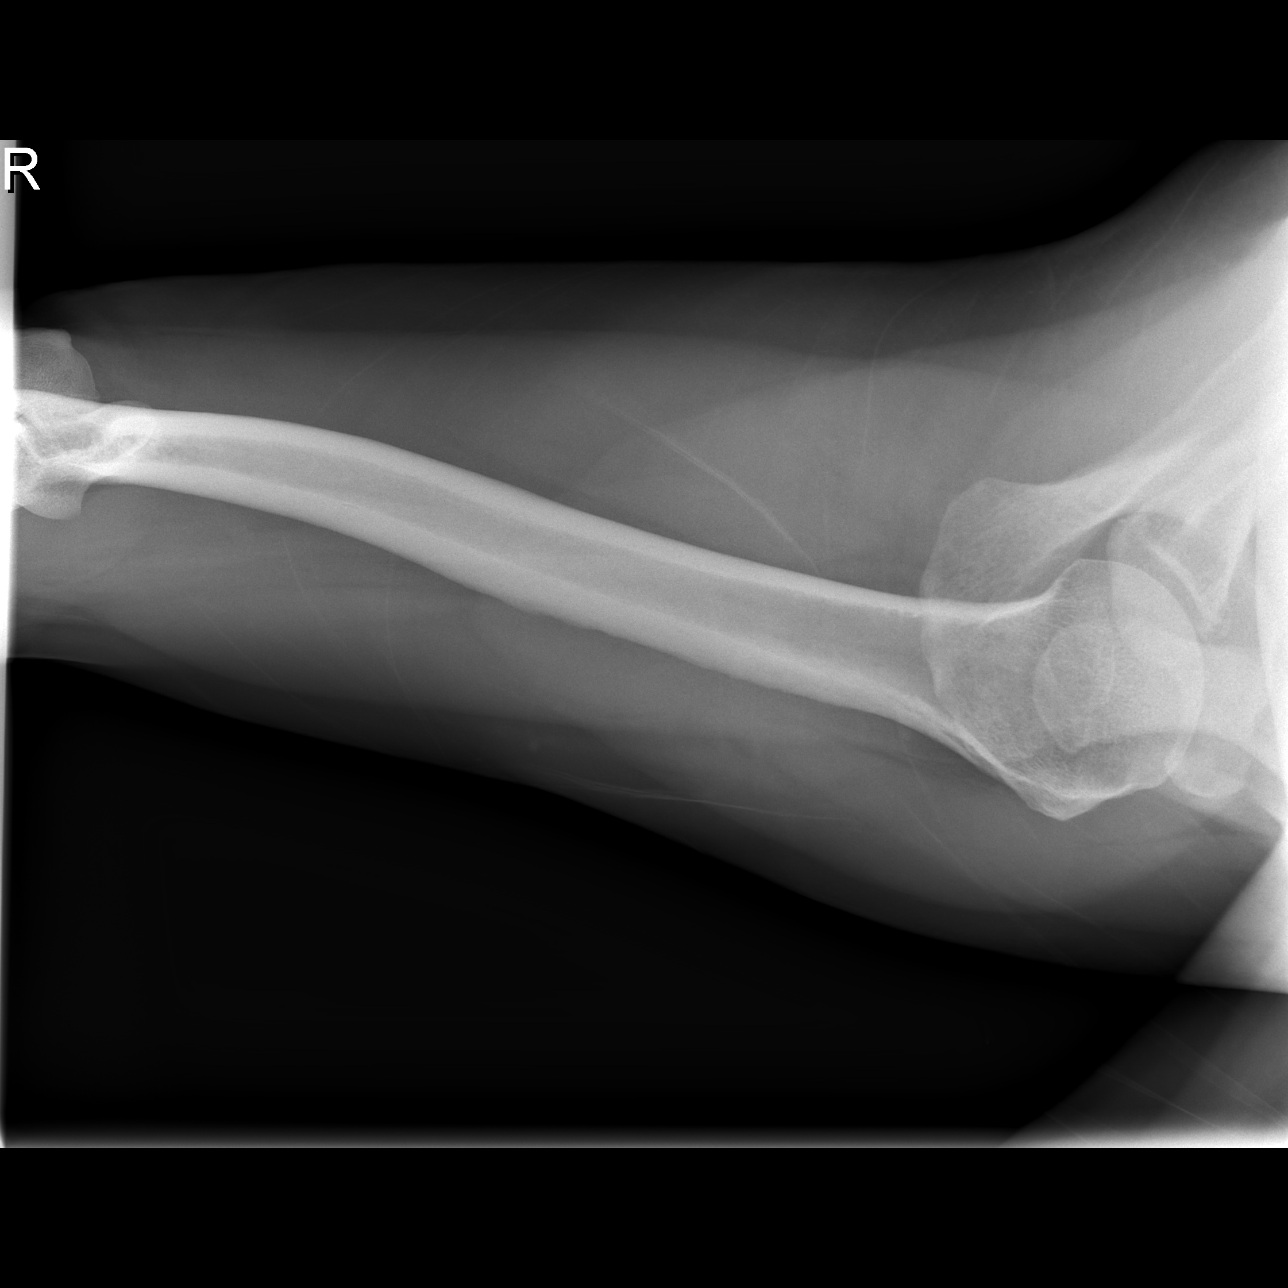

[3 of 3 positions shown; findings below may reference images not displayed]

FINDINGS: Minimal inferior glenohumeral joint space narrowing and peripheral
osteophytosis. No significant degenerative change of the
acromioclavicular joint. No acute fracture or dislocation. The
visualized portion of the right lung is unremarkable. Tip of the
left chest wall porta catheter, completely seen on prior frontal
chest radiograph, terminates overlying the superior vena cava/right
atrial junction.
IMPRESSION: Minimal inferior glenohumeral osteoarthritis.
